# Patient Record
Sex: Female | Born: 1935
Health system: Southern US, Community
[De-identification: ages and names within clinical notes are randomized; demographics above are authoritative.]

## PROBLEM LIST (undated history)

## (undated) DIAGNOSIS — Z9981 Dependence on supplemental oxygen: Secondary | ICD-10-CM

## (undated) DIAGNOSIS — N289 Disorder of kidney and ureter, unspecified: Secondary | ICD-10-CM

## (undated) DIAGNOSIS — F419 Anxiety disorder, unspecified: Secondary | ICD-10-CM

## (undated) DIAGNOSIS — Z436 Encounter for attention to other artificial openings of urinary tract: Secondary | ICD-10-CM

## (undated) DIAGNOSIS — E785 Hyperlipidemia, unspecified: Secondary | ICD-10-CM

## (undated) DIAGNOSIS — K529 Noninfective gastroenteritis and colitis, unspecified: Secondary | ICD-10-CM

## (undated) DIAGNOSIS — I739 Peripheral vascular disease, unspecified: Secondary | ICD-10-CM

## (undated) DIAGNOSIS — R0602 Shortness of breath: Secondary | ICD-10-CM

## (undated) DIAGNOSIS — I219 Acute myocardial infarction, unspecified: Secondary | ICD-10-CM

## (undated) DIAGNOSIS — R233 Spontaneous ecchymoses: Secondary | ICD-10-CM

## (undated) DIAGNOSIS — R569 Unspecified convulsions: Secondary | ICD-10-CM

## (undated) DIAGNOSIS — R413 Other amnesia: Secondary | ICD-10-CM

## (undated) DIAGNOSIS — J449 Chronic obstructive pulmonary disease, unspecified: Secondary | ICD-10-CM

## (undated) DIAGNOSIS — K52839 Microscopic colitis, unspecified: Secondary | ICD-10-CM

## (undated) DIAGNOSIS — I639 Cerebral infarction, unspecified: Secondary | ICD-10-CM

## (undated) DIAGNOSIS — I609 Nontraumatic subarachnoid hemorrhage, unspecified: Secondary | ICD-10-CM

## (undated) DIAGNOSIS — F32A Depression, unspecified: Secondary | ICD-10-CM

## (undated) DIAGNOSIS — K219 Gastro-esophageal reflux disease without esophagitis: Secondary | ICD-10-CM

## (undated) DIAGNOSIS — F329 Major depressive disorder, single episode, unspecified: Secondary | ICD-10-CM

## (undated) DIAGNOSIS — F0390 Unspecified dementia without behavioral disturbance: Secondary | ICD-10-CM

## (undated) DIAGNOSIS — Z8619 Personal history of other infectious and parasitic diseases: Secondary | ICD-10-CM

## (undated) DIAGNOSIS — R238 Other skin changes: Secondary | ICD-10-CM

## (undated) DIAGNOSIS — M1990 Unspecified osteoarthritis, unspecified site: Secondary | ICD-10-CM

## (undated) DIAGNOSIS — C4490 Unspecified malignant neoplasm of skin, unspecified: Secondary | ICD-10-CM

## (undated) DIAGNOSIS — Z8709 Personal history of other diseases of the respiratory system: Secondary | ICD-10-CM

## (undated) DIAGNOSIS — N1330 Unspecified hydronephrosis: Secondary | ICD-10-CM

## (undated) DIAGNOSIS — K449 Diaphragmatic hernia without obstruction or gangrene: Secondary | ICD-10-CM

## (undated) HISTORY — PX: APPENDECTOMY: SHX54

## (undated) HISTORY — PX: TONSILLECTOMY: SUR1361

## (undated) HISTORY — PX: TUBAL LIGATION: SHX77

## (undated) HISTORY — PX: KNEE ARTHROSCOPY: SUR90

## (undated) HISTORY — DX: Unspecified convulsions: R56.9

## (undated) HISTORY — PX: SKIN CANCER EXCISION: SHX779

---

## 2000-11-09 ENCOUNTER — Ambulatory Visit (HOSPITAL_COMMUNITY): Admission: RE | Admit: 2000-11-09 | Discharge: 2000-11-09 | Payer: Self-pay | Admitting: Ophthalmology

## 2001-02-01 ENCOUNTER — Ambulatory Visit (HOSPITAL_COMMUNITY): Admission: RE | Admit: 2001-02-01 | Discharge: 2001-02-01 | Payer: Self-pay | Admitting: Internal Medicine

## 2001-02-01 ENCOUNTER — Encounter: Payer: Self-pay | Admitting: Internal Medicine

## 2001-05-12 ENCOUNTER — Ambulatory Visit (HOSPITAL_COMMUNITY): Admission: RE | Admit: 2001-05-12 | Discharge: 2001-05-12 | Payer: Self-pay | Admitting: Internal Medicine

## 2001-05-12 ENCOUNTER — Encounter: Payer: Self-pay | Admitting: Internal Medicine

## 2001-06-28 ENCOUNTER — Ambulatory Visit (HOSPITAL_COMMUNITY): Admission: RE | Admit: 2001-06-28 | Discharge: 2001-06-28 | Payer: Self-pay | Admitting: Internal Medicine

## 2002-03-02 ENCOUNTER — Encounter: Payer: Self-pay | Admitting: Internal Medicine

## 2002-03-02 ENCOUNTER — Ambulatory Visit (HOSPITAL_COMMUNITY): Admission: RE | Admit: 2002-03-02 | Discharge: 2002-03-02 | Payer: Self-pay | Admitting: Internal Medicine

## 2002-08-24 ENCOUNTER — Other Ambulatory Visit: Admission: RE | Admit: 2002-08-24 | Discharge: 2002-08-24 | Payer: Self-pay | Admitting: Unknown Physician Specialty

## 2003-03-15 ENCOUNTER — Ambulatory Visit (HOSPITAL_COMMUNITY): Admission: RE | Admit: 2003-03-15 | Discharge: 2003-03-15 | Payer: Self-pay | Admitting: Internal Medicine

## 2003-03-28 ENCOUNTER — Ambulatory Visit (HOSPITAL_COMMUNITY): Admission: RE | Admit: 2003-03-28 | Discharge: 2003-03-28 | Payer: Self-pay | Admitting: Internal Medicine

## 2003-10-09 ENCOUNTER — Ambulatory Visit (HOSPITAL_COMMUNITY): Admission: RE | Admit: 2003-10-09 | Discharge: 2003-10-09 | Payer: Self-pay | Admitting: Family Medicine

## 2004-03-31 ENCOUNTER — Ambulatory Visit (HOSPITAL_COMMUNITY): Admission: RE | Admit: 2004-03-31 | Discharge: 2004-03-31 | Payer: Self-pay | Admitting: Internal Medicine

## 2005-05-21 ENCOUNTER — Ambulatory Visit (HOSPITAL_COMMUNITY): Admission: RE | Admit: 2005-05-21 | Discharge: 2005-05-21 | Payer: Self-pay | Admitting: Family Medicine

## 2006-02-22 ENCOUNTER — Ambulatory Visit: Payer: Self-pay | Admitting: Internal Medicine

## 2006-03-01 ENCOUNTER — Encounter (INDEPENDENT_AMBULATORY_CARE_PROVIDER_SITE_OTHER): Payer: Self-pay | Admitting: *Deleted

## 2006-03-01 ENCOUNTER — Ambulatory Visit (HOSPITAL_COMMUNITY): Admission: RE | Admit: 2006-03-01 | Discharge: 2006-03-01 | Payer: Self-pay | Admitting: Internal Medicine

## 2006-03-01 ENCOUNTER — Ambulatory Visit: Payer: Self-pay | Admitting: Internal Medicine

## 2006-04-28 ENCOUNTER — Ambulatory Visit: Payer: Self-pay | Admitting: Internal Medicine

## 2006-12-03 ENCOUNTER — Ambulatory Visit (HOSPITAL_COMMUNITY): Admission: RE | Admit: 2006-12-03 | Discharge: 2006-12-03 | Payer: Self-pay | Admitting: Family Medicine

## 2007-07-27 ENCOUNTER — Ambulatory Visit: Payer: Self-pay | Admitting: Internal Medicine

## 2007-08-10 ENCOUNTER — Ambulatory Visit: Payer: Self-pay | Admitting: Internal Medicine

## 2007-08-10 ENCOUNTER — Encounter: Payer: Self-pay | Admitting: Internal Medicine

## 2007-08-10 ENCOUNTER — Ambulatory Visit (HOSPITAL_COMMUNITY): Admission: RE | Admit: 2007-08-10 | Discharge: 2007-08-10 | Payer: Self-pay | Admitting: Internal Medicine

## 2008-01-06 DIAGNOSIS — I609 Nontraumatic subarachnoid hemorrhage, unspecified: Secondary | ICD-10-CM

## 2008-01-06 HISTORY — DX: Nontraumatic subarachnoid hemorrhage, unspecified: I60.9

## 2008-01-23 ENCOUNTER — Emergency Department (HOSPITAL_COMMUNITY): Admission: EM | Admit: 2008-01-23 | Discharge: 2008-01-24 | Payer: Self-pay | Admitting: Emergency Medicine

## 2008-01-24 ENCOUNTER — Ambulatory Visit: Payer: Self-pay | Admitting: Cardiovascular Disease

## 2008-01-24 ENCOUNTER — Inpatient Hospital Stay (HOSPITAL_COMMUNITY): Admission: AD | Admit: 2008-01-24 | Discharge: 2008-01-26 | Payer: Self-pay | Admitting: Neurology

## 2008-01-24 ENCOUNTER — Encounter: Payer: Self-pay | Admitting: Emergency Medicine

## 2008-01-25 ENCOUNTER — Encounter (INDEPENDENT_AMBULATORY_CARE_PROVIDER_SITE_OTHER): Payer: Self-pay | Admitting: Neurology

## 2008-08-16 ENCOUNTER — Ambulatory Visit (HOSPITAL_COMMUNITY): Admission: RE | Admit: 2008-08-16 | Discharge: 2008-08-16 | Payer: Self-pay | Admitting: Family Medicine

## 2009-08-26 ENCOUNTER — Ambulatory Visit (HOSPITAL_COMMUNITY): Admission: RE | Admit: 2009-08-26 | Discharge: 2009-08-26 | Payer: Self-pay | Admitting: Family Medicine

## 2010-01-26 ENCOUNTER — Encounter: Payer: Self-pay | Admitting: Family Medicine

## 2010-01-27 ENCOUNTER — Encounter: Payer: Self-pay | Admitting: Family Medicine

## 2010-04-21 LAB — GLUCOSE, CAPILLARY
Glucose-Capillary: 110 mg/dL — ABNORMAL HIGH (ref 70–99)
Glucose-Capillary: 119 mg/dL — ABNORMAL HIGH (ref 70–99)
Glucose-Capillary: 87 mg/dL (ref 70–99)
Glucose-Capillary: 95 mg/dL (ref 70–99)

## 2010-04-21 LAB — DIFFERENTIAL
Basophils Absolute: 0 10*3/uL (ref 0.0–0.1)
Basophils Relative: 1 % (ref 0–1)
Eosinophils Absolute: 0.1 10*3/uL (ref 0.0–0.7)
Lymphs Abs: 1.3 10*3/uL (ref 0.7–4.0)
Monocytes Absolute: 0.4 10*3/uL (ref 0.1–1.0)
Monocytes Relative: 8 % (ref 3–12)
Neutro Abs: 3.6 10*3/uL (ref 1.7–7.7)
Neutrophils Relative %: 65 % (ref 43–77)

## 2010-04-21 LAB — HEMOGLOBIN A1C: Mean Plasma Glucose: 117 mg/dL

## 2010-04-21 LAB — LIPID PANEL
Cholesterol: 214 mg/dL — ABNORMAL HIGH (ref 0–200)
Total CHOL/HDL Ratio: 2.7 RATIO
VLDL: 20 mg/dL (ref 0–40)

## 2010-04-21 LAB — CBC
Hemoglobin: 13.4 g/dL (ref 12.0–15.0)
MCHC: 33.6 g/dL (ref 30.0–36.0)
MCV: 96.3 fL (ref 78.0–100.0)
RDW: 13.8 % (ref 11.5–15.5)

## 2010-04-21 LAB — COMPREHENSIVE METABOLIC PANEL
BUN: 8 mg/dL (ref 6–23)
Chloride: 103 mEq/L (ref 96–112)
Creatinine, Ser: 0.6 mg/dL (ref 0.4–1.2)
Glucose, Bld: 114 mg/dL — ABNORMAL HIGH (ref 70–99)
Potassium: 3.9 mEq/L (ref 3.5–5.1)
Sodium: 137 mEq/L (ref 135–145)

## 2010-04-21 LAB — URINE MICROSCOPIC-ADD ON

## 2010-04-21 LAB — HOMOCYSTEINE: Homocysteine: 7.8 umol/L (ref 4.0–15.4)

## 2010-04-21 LAB — URINALYSIS, ROUTINE W REFLEX MICROSCOPIC
Glucose, UA: NEGATIVE mg/dL
Hgb urine dipstick: NEGATIVE
Protein, ur: NEGATIVE mg/dL
Specific Gravity, Urine: 1.011 (ref 1.005–1.030)

## 2010-04-21 LAB — APTT: aPTT: 28 seconds (ref 24–37)

## 2010-05-20 NOTE — Discharge Summary (Signed)
Bridget Ball, Bridget Ball                ACCOUNT NO.:  [id number]   MEDICAL RECORD NO.:  [id number]          PATIENT TYPE:  INP   LOCATION:  3032                         FACILITY:  [facility]   PHYSICIAN:  Pramod P. Pearlean Brownie, MD    DATE OF BIRTH:  1929-01-02   DATE OF ADMISSION:  01/24/2008  DATE OF DISCHARGE:  01/26/2008                               DISCHARGE SUMMARY   DIAGNOSES AT TIME OF DISCHARGE:  1. Right parietal and left frontal small subarachnoid hemorrhages,      question venous angiomas.  2. Acute right frontal ischemic infarct.  3. Dyslipidemia.  4. Arthroscopic right knee surgery.  5. Chronic obstructive pulmonary disease.  6. Six previous normal deliveries.   MEDICINES AT TIME OF DISCHARGE:  1. Keppra 250 mg b.i.d.  2. Zantac 75 mg a day.  3. Atrovent inhaler p.r.n.  4. Simvastatin 20 mg a day.  5. Discontinue aspirin.   STUDIES PERFORMED:  1. CT of the brain on January 18, shows no acute abnormality, 2 foci      of __________ density, one at paramedian frontal lobe and second in      the high right frontal lobe concerning for subarachnoid hemorrhage.  2. MRI of the brain shows right precentral sulcus subarachnoid      hemorrhage, signal abnormality in the cortex and perhaps CSF of the      medial left frontal lobe, question infarction versus focal      cerebritis versus distal embolic subacute infarctions.  Punctated      area of diffusion abnormality right frontal operculum, likely      subacute/acute infarct,  evidence of periventricular subcortical      white matter changes compatible with chronic microvascular      ischemia.  3. A 2-D echocardiogram shows EF of 55% with no obvious source of      embolus.  4. Carotid Doppler shows no ICA stenosis bilaterally.  5. Transcranial Doppler completed, results pending.  6. EEG performed, results pending.   LABORATORY STUDIES:  Homocysteine 7.8.  Hemoglobin A1c 5.7.  Urinalysis  with 3-6 white blood cells, 0-2 red blood  cells, otherwise normal.  Cholesterol 214, triglycerides 101, HDL 80, and LDL 114.  Cardiac  enzymes with CK-MB 4.2 and troponin normal.  Chemistry with glucose 114,  otherwise normal.  Coagulation studies normal.  CBC normal.   HISTORY OF PRESENT ILLNESS:  [name] is a 77 year old right-  handed Caucasian female who was seen on January 23, 2008, at [facility]  Emergency Room after eating dinner and having a sudden sharp pain in her  left arm from the elbow to the shoulder.  She said her hand became claw  like and she was unable to use it.  This resolved over a few minutes.  It was followed by numbness and tingling in the left side of her face  perhaps her left arm, but she is not sure.  All symptoms had resolved by  the time she reached the emergency room.  A CT performed at [facility]  showed a possible small focus of  subarachnoid blood.  The patient was  discharged home but asked to return for an MRI on January 19.  MRI  results were available.  They confirmed subarachnoid hemorrhage.  The  patient still had no neurologic complaints.  Multiple attempts were made  to find an admitting physician for this patient and eventually  neurologist admitted her to [facility] for further evaluation.   HOSPITAL COURSE:  Interventional Neuroradiology performed a cerebral  angiogram in the hospital showing no occlusion, dissections, or  aneurysms.  She has venous outflow that was grossly within normal  limits.  No etiology for her subarachnoid hemorrhage was found.  It was  felt she may have had a venous angioma with her subarachnoid hemorrhage  __________ and history of seizure.  She was started on Keppra 250 mg  twice a day and was doing well.  An EEG was performed and that is  pending at the time of discharge.  She has no neurologic complaints or  therapy needs.   CONDITION ON DISCHARGE:  Alert and oriented x3.  No focal neurologic  deficit.  No weakness.   DISCHARGE PLAN:  1.  Discharged home with family.  2. No aspirin or aspirin-containing products.  3. New Keppra.  4. Follow up with Dr. Delia Heady in 2-3 months.  5. Follow up with primary care physician, Dr. Cecelia Byars within 1 month.      Annie Main, N.P.    ______________________________  Sunny Schlein. Pearlean Brownie, MD    SB/MEDQ  D:  01/26/2008  T:  01/27/2008  Job:  [id number]   cc:   Pramod P. Pearlean Brownie, MD

## 2010-05-20 NOTE — H&P (Signed)
NAME:  Bridget Ball, BANGERTER                ACCOUNT NO.:  [id number]   MEDICAL RECORD NO.:  [id number]          PATIENT TYPE:  AMB   LOCATION:  DAY                           FACILITY:  [facility]   PHYSICIAN:  R. Roetta Sessions, M.D. DATE OF BIRTH:  04-Oct-1928   DATE OF ADMISSION:  DATE OF DISCHARGE:  [facility]                              HISTORY & PHYSICAL   CHIEF COMPLAINT:  Vague upper abdominal discomfort, nausea, anorexia,  weakness, and black stools for 3 weeks.   HISTORY OF PRESENT ILLNESS:  [name] is a white 77 year old Caucasian  female followed primarily by Dr. Oval Linsey, who came to see me  after developing a sore throat in early June.  She was seen by Dr.  Janna Arch who gave her Z-Pak and some naproxen to take for throat pain.  She took several days' worth of naproxen.  Throat pain eventually  subsided, but she has developed lack of appetite and chronic nausea, but  she has not vomited.  Her stools became black for 3 weeks.  She has  developed progressive weakness and has no energy to do much of anything.  She has not had any blood work.  She has not had any hematochezia or  hematemesis.  No odynophagia or dysphagia.  She has a history  gastroesophageal reflux disease, takes various over-the-counter H2  blockers.  She had been somewhat constipated recently as well.   This lady has a history of Schatzki ring which was dilated by Dr. Loreta Ave.  Several years ago, she was found to have an ulcer at the GE junction at  that time and also she had diarrhea which was worked up with a  colonoscopy and biopsies back in February 2008 without any evidence of  inflammatory bowel disease or microscopic colitis.  Gallbladder remains  in situ.   PAST MEDICAL HISTORY:  1. Hypercholesterolemia.  2. Gastroesophageal reflux disease.  3. Question COPD.   SURGERIES:  1. Tubal ligation.  2. Tonsillectomy.  3. Right lumpectomy, breast.  4. Right knee surgery.   ALLERGIES:  PENICILLIN.   FAMILY  HISTORY:  Mother died at age 61 with a CVA.  Father was killed in  an accident.  Otherwise, no history of chronic GI or liver illness.   SOCIAL HISTORY:  The patient is widowed.  She is retired from [employer].  Quit smoking 15 years ago, 2 glasses of wine each night is all  the alcohol she consumes.   REVIEW OF SYSTEMS:  No recent chest pain, dyspnea on exertion, fever, or  chills.   PHYSICAL EXAMINATION:  GENERAL:  A thin 77 year old lady resting  comfortably.  VITAL SIGNS:  Weight 124, height 5 feet 3 inches, temperature 98, BP  110/74, and pulse 80.  SKIN:  Warm and dry.  There is no jaundice.  HEENT:  No scleral icterus.  Conjunctivae are somewhat pale.  CHEST:  Lungs are clear to auscultation.  CARDIOVASCULAR:  Regular rate and rhythm without murmur, gallop, or rub.  ABDOMEN:  Nondistended.  Positive bowel sounds, soft, and nontender  without appreciable mass or organomegaly.  EXTREMITIES:  No edema.  RECTAL:  No mass in the rectal vault.  Good sphincter tone.  Scant dark  brown stools, Hemoccult positive.   IMPRESSION:  [name] is a nice pleasant 77 year old lady with a recent  treatment for what sounds like pharyngitis with azithromycin and  subsequently taking naproxen for symptomatic relief as well, who  developed progressive anorexia and vague upper abdominal discomfort and  has had black stools for 3 weeks, not mentioned why she has not been  taking Pepto-Bismol.  I am concerned about the possibility of her having  an nonsteroidal antiinflammatory drug-induced peptic ulcer and a recent  gastrointestinal bleed.  She remains hemodynamically stable.  She is to  refrain from taking any further Naprosyn.   RECOMMENDATIONS:  Begin Aciphex 20 mg orally daily, samples given.  We  will check a CBC today, and we will proceed with an EGD.  Forthwith  risks, benefits, alternatives, and limitations have been reviewed,  questions were answered.  Make further recommendations  in the very near  future.      Jonathon Bellows, M.D.  Electronically Signed     RMR/MEDQ  D:  07/27/2007  T:  07/28/2007  Job:  [id number]   cc:   Melvyn Novas, MD  Fax: [phone]

## 2010-05-20 NOTE — Procedures (Signed)
CLINICAL HISTORY:  The patient is a 77 year old, admitted on January 24, 2008, for TIA, subdural hemorrhage, left-handed weakness and numbness,  history of chronic obstructive pulmonary disease.  Study is being done  to look for the presence of seizures (780.39)   PROCEDURE:  The tracing was carried out on a 32-channel digital Cadwell  recorder, reformatted into 16-channel montages with one devoted to EKG.  The patient was awake during the recording and drowsy.  The  International 10/20 system lead placement used.   DESCRIPTION OF FINDINGS:  Dominant frequency was 9 Hz, 25-30 microvolt  activity with frontally predominant beta range activity with drowsiness,  mixed frequency theta range activity with superimposed.  There was no  focal slowing.  There was no interictal epileptiform activity in the  form of spikes or sharp waves.   Activating procedures with photic stimulation failed to induce driving  response.  Hyperventilation was not carried out.  EKG showed regular  sinus rhythm with ventricular response of 84 beats per minute.   IMPRESSION:  Normal record with the patient awake and drowsy.      Deanna Artis. Sharene Skeans, M.D.  Electronically Signed     ZOX:WRUE  D:  01/26/2008 22:47:42  T:  01/27/2008 45:40:98  Job #:  [id number]   cc:   Melvyn Novas, M.D.  Fax: [phone]

## 2010-05-20 NOTE — Op Note (Signed)
NAME:  Bridget Ball, Bridget Ball                ACCOUNT NO.:  [id number]   MEDICAL RECORD NO.:  [id number]          PATIENT TYPE:  AMB   LOCATION:  DAY                           FACILITY:  [facility]   PHYSICIAN:  R. Roetta Sessions, M.D. DATE OF BIRTH:  [birth year]   DATE OF PROCEDURE:  08/10/2007  DATE OF DISCHARGE:                               OPERATIVE REPORT   EGD with biopsy.   INDICATIONS FOR PROCEDURE:  A 77 year old lady who had recent upper  abdominal pain in the setting of azithromycin and Naprosyn use.  She has  had 3-week history of black tarry stools.  She stopped taking Naprosyn,  stools normalized in color, and the upper abdominal pain has subsided.  She has been on Aciphex 20 mg orally daily.  She was hemoccult positive  in the office.  However, CBC from July 27, 2007, was completely normal,  H&H 13.3 and 40.70, and MCV 96.4.  Last colonoscopy was in February  2008.  She was found to have no evidence of mucosal abnormality.  EGD is  now being done.  This approach was discussed with the patient at length.  Risks, benefits, and alternatives have been reviewed and questions were  answered.  Of note, she has not had any reflux symptoms and no  esophageal dysphagia recently.   PROCEDURE NOTE:  O2 saturation, blood pressure, pulse, and respirations  monitored throughout the entire procedure.   CONSCIOUS SEDATION:  Versed 3 mg IV and Demerol 50 mg IV in divided  doses.   INSTRUMENT:  Pentax video chip system.  Cetacaine spray for topical  pharyngeal anesthesia.   FINDINGS:  Examination of tubular esophagus revealed no mucosal  abnormalities.  EG junction easily traversed.  Stomach:  Gastric cavity  was emptied and insufflated very well with air.  Thorough examination of  the gastric mucosa including retroflexed view of the proximal stomach  esophagogastric junction demonstrated a small hiatal hernia and some  superficial antral erosions.  The gastric body and fundus had a knobby  or  nodular appearance, did not really see an infiltrating process.  There was no ulcer, but the mucosa did appear to be abnormal.  Pylorus  was patent and easily traversed.  Examination of the bulb, second  portion revealed no abnormalities.  Therapeutic/diagnostic maneuvers  performed.  Multiple biopsies of gastric mucosa are taken for histologic  study.  The patient tolerated the procedure well and was reacted in  Endoscopy.   IMPRESSION:  Normal esophagus, small hiatal hernia, knobby nodular-  appearing gastric mucosa body and fundus of stress significance status  post biopsy antral erosions.  No ulcer or infiltrating process seen.  Patent pylorus.  Normal D1 and D2.   RECOMMENDATIONS:  1. Continue Aciphex 20 mg orally daily, prescription given.  2. Followup on path.  3. Further recommendations to follow.      Jonathon Bellows, M.D.  Electronically Signed     RMR/MEDQ  D:  08/10/2007  T:  08/11/2007  Job:  [id number]   cc:   Delbert Harness, MD

## 2010-05-20 NOTE — H&P (Signed)
Bridget Ball, Bridget Ball NO.:  [id number]   MEDICAL RECORD NO.:  [id number]          PATIENT TYPE:  INP   LOCATION:  3032                         FACILITY:  [facility]   PHYSICIAN:  Melvyn Novas, M.D.  DATE OF BIRTH:  [birth year]   DATE OF ADMISSION:  01/24/2008  DATE OF DISCHARGE:                              HISTORY & PHYSICAL   The patient is a 77 year old Caucasian right-handed female, former  smoker, who provided the following history.  She was seen on January 23, 2008, in the local ER [facility] after eating dinner and  feeling suddenly a sharp pain in her left arm from the elbow to the  shoulder.  She also said her hand became claw like and she was unable to  use it.  This spell resolved over a few minutes.  It was followed by  numbness and tingling in the left side of her face, perhaps also the  left arm, but she is not sure about that.  She states that she was  afraid this may mean a stroke.  She looked into the marrow to see if she  could detect a left-sided facial droop, but she had none and she had  also no trouble with speech.  All symptoms resolved by the time she came  to the ER.  A CT was then obtained at [facility] showing a possible small  focus of subarachnoid blood.  The patient was discharged home, but asked  to return for an MRI today on January 24, 2008.  After the MRI results were available, she was asked by the ER physician  to stay in the ER until a stroke bed was found.  It turns out that the patient who had no physical complaints since  yesterday had also an abnormal MRI stating subarachnoid blood as  confirmed in the right high frontal lobe within the precentral sulcus.  There is increased signal within the CSF and cortex.  Gradient images  did demonstrate a signal loss.  White matter changes were already present and seemed to be of remote  origin, punctate area of diffusion abnormality in the right frontal lobe  pattern was noted by Dr. Alfredo Batty.  The question of a subarachnoid hemorrhage was answered by the  radiologist as being positive.  The results were telephoned to Dr.  Tressie Stalker, neurosurgeon on-call, who stated that the patient  should be deferred to Dr. Corliss Skains.  The ER physician in [facility], Dr. Colon Branch called Dr. Corliss Skains who  stated that he would like to see the patient but she has to be admitted  to somebody else's service.  He will see her in consultation and for any  interventions.  Instead of deferring the patient to a hospitalist  service, it was mentioned to the ER physician that she may try the  Neurology Service.  At this time, the ER physician is I think  understandably frustrated and I accept that the patient thereby in  transfer for further workup which will probably include a catheter  cerebral angiogram in the morning.   The  patient has a past medical history of six normal pregnancies with  vaginal deliveries, arthroscopic knee surgery of right knee.   She has no history of diabetes, hypertension, or obstructive sleep  apnea.  She states she has hypercholesterolemia and also she believes  she has asthma.  Her doctors have told her multiple times she has COPD.  Her primary care physician is Dr. Leeann Must.  Stroke risk factors as  named above.  Coagulopathy, previous strokes, or obesity are not  present.   MEDICATIONS:  1. Simvastatin 20 mg daily.  2. Aspirin 81 mg daily.  3. She is on multivitamins.  4. Atrovent p.r.n.   The patient is allergic to PENICILLIN.   FAMILY HISTORY:  The patient said her mother, who at the age of 36 died  following a stroke.  She may have had multiple strokes in her last year  of life she states.   SOCIAL HISTORY:  The patient is widowed since age 62 in 73.  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  She was smoking until 1999.  Her  remote history of  tobacco use was not matched by any history of illicit  drug use or alcohol use.   REVIEW OF SYSTEMS:  The patient endorsed a diffuse headache but has no  neck rigors or stiffness.  She has temporal artery tenderness and she  has occipital tenderness, but no palpable induration of the temporal  artery is noted and no paraspinal trigger points were elicited.  She had  no problems in terms of pulmonary, musculoskeletal, hematologic,  lymphatic, GI, endocrine, allergic, reproductive, or psychiatric  problems.   PHYSICAL EXAMINATION:  VITAL SIGNS:  Temperature of 98 degrees  Fahrenheit, blood pressure is 125/78, heart rate is 69, respiratory rate  is 18 at rest, and O2 saturation is 92% at rest.  The height of the  patient is 5 feet 2 inches and weight is 122 pounds.  EARS, NOSE, AND THROAT:  Normal.  LUNGS:  Clear to auscultation.  No wheezing.  No rhonchi.  ABDOMEN:  Soft, nontender, and nondistended.  NEUROLOGIC:  The patient is not obese, of normal stature.  She is alert  and oriented x3, has a fluent speech.  Full range of motion for her neck  and shows no facial asymmetry or sensory loss.  She has on cranial nerve  examination full extraocular movements.  No nystagmus.  No jaw tremor.  Motor examination shows equal strength but increased tones throughout  the left upper and lower extremities.  Deep tendon reflexes are slightly  brisker on the left than on the right and an upgoing toe response to  Babinski maneuver was noted on the left only.  The patient has no loss  of sensory, however and her coordination test by finger-to-nose showed  slowing bilaterally but dysmetria was only seen on the left.  Heel-to-  shin was normal.   The patient's nutritional status is normal.  Her NIH stroke scale is 0.  Her modified Rankin score is 0.  The patient is presenting after CT and  MRI have been obtained at [facility], both have been read as  showing a subarachnoid hemorrhage.  Because  of the suspected  subarachnoid hemorrhage, there is a possibility that the patient may  have a focal seizure and that she can develop vascular spasms.  We will  tomorrow perform a catheter angiogram under the  guidance of Dr. Julieanne Cotton, who was already called.  I will ask Dr. Tressie Stalker to  consult on the patient.  I will continue her medication which includes  Tylenol PM, Zocor, simvastatin and Atrovent inhalers.  She will also be  on Reglan, Tylenol p.r.n., and aspirin daily.  She will be on an IV  fluid of normal saline in spite of her regular diet.  The patient was of  course not a t-PA patient given her history of possible bleed.   LABORATORY TESTS:  Show a normal comprehensive metabolic panel  nonfasting which showed elevated glucose.  PTT was 28, PT was 12.6, and  INR 0.9 and CBC with differential showed a white blood cell count of  5.6, hemoglobin/hematocrit 13.4/39.7, and platelet count 235,000.  No  lymphoeosinophilia or granulocytemia.  Fasting lipid profile,  homocysteine, cardiac enzymes, and HbA1c will follow tomorrow.     Melvyn Novas, M.D.  Electronically Signed    CD/MEDQ  D:  01/24/2008  T:  01/25/2008  Job:  [id number]   cc:   Pramod P. Pearlean Brownie, MD

## 2010-05-23 NOTE — Consult Note (Signed)
Bridget Ball, Bridget Ball                ACCOUNT NO.:  [id number]   MEDICAL RECORD NO.:  [id number]          PATIENT TYPE:  AMB   LOCATION:  DAY                           FACILITY:  [facility]   PHYSICIAN:  Lionel December, M.D.    DATE OF BIRTH:  [birth year]   DATE OF CONSULTATION:  DATE OF DISCHARGE:                                 CONSULTATION   REFERRING PHYSICIAN:  Dr. Janna Arch.   CHIEF COMPLAINT:  Chronic diarrhea.   HISTORY OF PRESENT ILLNESS:  Bridget Ball is a 77 year old Caucasian  female who tells me approximately three months' ago she developed  diarrheal illness.  She was having multiple loose stools per day.  She  then began taking fiber laxatives and she felt that she was constipated.  Since that time, she discontinued the fiber laxatives.  She began to  have large volume diarrhea.  She tells me for approximately 6 weeks now  she has had within 10-20 loose watery stools per day.  She was seen by  Dr. Janna Arch and started on Flagyl and has completed about a weeks'  worth of treatment.  She is also using Lomotil p.r.n.  She denies any  rectal bleeding or mucus in her stools.  She does complain of  incontinence as well as bilateral lower quadrant abdominal pain, left  side greater than right.  She denies any fever, chills, nausea or  vomiting.  She denies any recent antibiotic use prior to the onset.  Denies any foreign travel or new medications or pets.  She has been  exposed to her grandchildren who did have viral gastroenteritis prior to  the onset of this.   PAST MEDICAL HISTORY:  1. Hiatal hernia.  2. COPD.  3. Hyperplastic polyp on flexible sigmoidoscopy back in 2000 by Dr.      Karilyn Cota.  She has never had a colonoscopy.  4. Tubal ligation.  5. Appendectomy.  6. Tonsillectomy.  7. Right knee surgery.   CURRENT MEDICATIONS:  1. Quinine p.r.n.  2. Zocor 20 mg daily.  3. Aspirin 81 mg daily.  4. Lomotil 2.5 mg p.r.n.  5. Flagyl 500 mg b.i.d.  6. Naproxen 1 b.i.d.  She  recently discontinued when she began to have      diarrhea.  7. Atrovent p.r.n.  8. Estro Vital nutrients.  9. Biotin 500 mcg daily.  10.Over-the-counter allergy medicine.  11.[employer] daily.  12.Gingko-biloba 60 mg daily.  13.Alfalfa 650 mg daily.  14.Ocuvite once daily.  15.Over-the-counter acid reducer daily.  16.Benifiber p.r.n.  17.Lecithin 400 mg daily.   ALLERGIES:  PENICILLIN.   FAMILY HISTORY:  There is no known family history of questionable liver  or chronic GI problems.  Mother died at age 32 secondary to  complications of diabetes mellitus.  Father died due to accident.  One  brother with history of diabetes mellitus.   SOCIAL HISTORY:  Bridget Ball lives alone.  She has 6 healthy children.  She is a widow.  She is retired.  She drinks 2 glasses of wine daily.  Denies any tobacco or drug use.   REVIEW  OF SYSTEMS:  CONSTITUTIONAL:  Weight is stable.  Denies any fever  or chills.  Denies any fatigue.  CARDIOVASCULAR:  Denies any chest pain  or palpitation.  RESPIRATORY:  No shortness of breath, dyspnea, cough or  hemoptysis.  GI:  See HPI.  Denies any heartburn, angina, dysphagia,  odynophagia, anorexia or early satiety.  GU:  Denies any dysuria,  hematuria or increased urinary frequency.   PHYSICAL EXAMINATION:  VITAL SIGNS:  Weight 132.5 pounds.  Height 63  inches to 93.  Blood pressure 110/74, pulse 74.  GENERAL:  Bridget Ball is an 77 year old Caucasian female who is alert,  oriented, pleasant, cooperative in no acute distress.  She appears  younger than her stated age.  HEENT:  Sclerae are clear.  Nonicteric.  Conjunctivae pink.  Oropharynx  pink and moist without lesions.  Neck supple without thyromegaly.  CHEST:  Regular rate and rhythm.  Normal S1-S2 with murmurs, clicks,  rubs or gallops.  LUNGS:  Clear to auscultation bilaterally.  ABDOMEN:  Positive bowel sounds x4.  No bruits auscultated.  She has  mild tenderness to left lower quadrant on deep  palpation.  No rebound  tenderness or guarding.  No hepatosplenomegaly or mass.  RECTAL:  Deferred.  EXTREMITIES:  Without clubbing or edema bilaterally.  SKIN:  Pink, warm and dry without any rashes or jaundice.   IMPRESSION:  Ms. Amstutz is a 77 year old female with a 73-month history  of diarrhea, more recently the last 6 weeks have been quite severe.  She  is having upwards of 10 to 20 loose, watery stools a day.  She has been  given Flagyl through her primary care physician and has completed a  weeks' worth.  Etiology of her diarrhea include bacterial infection,  colitis, lymphocytic colitis or late onset inflammatory bowel disease.  Other possibility include lymphoma.   PLAN:  1. Colonoscopy with Dr. Karilyn Cota in the near future.  I discussed the      procedure including risks and benefits including all risks of      bleeding, infection, perforation and she agrees to the things that      will be obtained.  2. She can continue Lomotil p.r.n.  She is instructed to go ahead and      complete the course of Flagyl.  3. Further recommendations pending procedure.   We would like to thank Dr. Janna Arch for allowing [place] to participate in  the care of Bridget Ball.      Nicholas Lose, N.P.      Lionel December, M.D.  Electronically Signed    KC/MEDQ  D:  02/22/2006  T:  02/22/2006  Job:  [id number]   cc:   Melvyn Novas, MD  Fax: [phone]

## 2010-05-23 NOTE — Consult Note (Signed)
[facility]  Patient:    Bridget Ball, Bridget Ball Visit Number: [id number] MRN: [id number]          Service Type: OUT Location: RAD Attending Physician:  Della Goo Dictated by:   Gardiner Coins, P.A.-C. Proc. Date: 06/21/01 Admit Date:  05/12/2001 Discharge Date: 05/12/2001   CC:         Ron Parker, M.D.   Consultation Report  DATE OF BIRTH:  [birth year]  REFERRING PHYSICIAN:  Ron Parker, M.D.  CHIEF COMPLAINT:  Trouble swallowing, abdominal bloating and discomfort.  HISTORY OF PRESENT ILLNESS:  The patient is a very nice 77 year old Caucasian female we have been asked to see by Dr. Lovell Sheehan for further evaluation of above complaints.  She has just over a months history of abdominal bloating and pressure-type pain and discomfort.  Symptoms are in the epigastric area. No associated nausea or vomiting.  She has also noted some pill dysphagia and early satiety.  She had an upper GI series with barium esophagogram and small-bowel follow-through May 12, 2001 that revealed normal primary esophageal peristalsis with mildly impaired secondary peristalsis, small sliding hiatal hernia, smooth short segment narrowing of the distal esophagus at the GE junction with the remainder of the esophagus, stomach, duodenal bulb and small bowel appearing normal.  Terminal ileum was normal.  A 12.5 mm barium tablet passed normally through the esophagus to the level of the narrowing of the GE junction and initially lodged at that location but passed after repeat swallows of water.  She does have heartburn one to two times per month primarily with food triggers such as barbecue.  She has a history of GERD for which symptoms were well controlled with Prilosec 20 mg daily.  She has discontinued this because of cough and is taking Zantac on a p.r.n. basis now. Has known history of cholelithiasis that is felt to be asymptomatic.  Also has a history of  alternating constipation and diarrhea and has been felt in the past to have IBS.  Had flexible sigmoidoscopy done by Dr. Karilyn Cota September 24, 1998 that revealed multiple tiny polyps at the rectosigmoid junction that were hyperplastic on biopsy.  At this point having bowel movement every day very rarely will go every other day.  No hematochezia or melena.  Stools are darker than usual when she eats spinach.  CURRENT MEDICATIONS:  Atrovent inhaler b.i.d. and Zantac p.r.n.  ALLERGIES:  PENICILLIN causes generalized skin rash and swelling.  PAST MEDICAL HISTORY:  History of hiatal hernia and questionable COPD. History of IBS.  History of multiple urethral dilatations.  History of recurrent allergic rhinitis and sinus problems.  PAST SURGICAL HISTORY:  Tonsillectomy, appendectomy, right benign breast biopsy, tubal ligation, and right knee surgery.  FAMILY HISTORY:  Mother is alive at age 77 and has a history of diabetes mellitus and hiatal hernia.  Father died age 39 an accidental death.  One brother alive and no sisters.  There is no family history of colorectal cancer, chronic liver disease, or chronic GI illness.  SOCIAL HISTORY:  The patient is widowed.  She has 5 children.  One daughter died at infancy and had some congenital defects at birth.  She is retired now but used to [employer] work.  She smoked a pack a day for about 40 years but quit 4-5 years ago.  Continues to drink wine socially.  REVIEW OF SYSTEMS:  As in HPI.  In addition, no chest pain or history of MI. Reports she  does have a history of heart murmur that they think may be related to possible rheumatic fever when she was a child.  Murmur was diagnosed after the birth of her fifth child.  No fevers or chills.  PHYSICAL EXAMINATION:  Weight 133-1/4 pounds (flat, no masses or organomegaly).  Mildly uncomfortable palpation of the left lower quadrant. No guarding, no rebound.  Rectal exam deferred as she had one done  at Dr. Teofilo Pod office May 10, 2001 that revealed no masses and heme-negative stool.  Extremities: No lower extremity edema.  IMPRESSION:  The patient is a very nice 77 year old with pill dysphagia as well as symptoms of bloating with associated upper abdominal bloating discomfort as well as some early satiety and rare typical reflux symptoms approximately one to two times per month.  Upper GI symptoms previously responded to PPI therapy but she is not on this because of cost considerations at this point and is only having rare typical reflux symptoms.  Does have known history of cholelithiasis.  Not mentioned above, negative Murphys sign on physical exam today.  Discussed EGD with ED with Ms. Terrilee Croak.  Her questions were answered and she is agreeable to proceeding with procedure.  RECOMMENDATIONS: 1. EGD with ED at [facility] in the near future.  Consider antibiotic    prophylaxis day of the procedure because of history of heart murmur    although none was appreciated on physical exam today. 2. Consider beginning PPI therapy after EGD. 3. Begin Citrucel one packet mixed in noncarbonated beverage of choice daily.    Given 21 sample packets of clear mix. 4. Hemoccult cards x3 as part of routine colorectal cancer screening.  We would like to thank Dr. Ron Parker for allowing [place] to participate in the care of this very nice woman. Dictated by:   Gardiner Coins, P.A.-C. Attending Physician:  Della Goo DD:  06/21/01 TD:  06/22/01 Job: [id number]

## 2010-05-23 NOTE — Op Note (Signed)
Bridget Ball, Bridget Ball                ACCOUNT NO.:  [id number]   MEDICAL RECORD NO.:  [id number]          PATIENT TYPE:  AMB   LOCATION:  DAY                           FACILITY:  [facility]   PHYSICIAN:  Lionel December, M.D.    DATE OF BIRTH:  10-18-1928   DATE OF PROCEDURE:  03/01/2006  DATE OF DISCHARGE:                               OPERATIVE REPORT   PROCEDURE:  Colonoscopy.   INDICATIONS:  Bridget Ball is a 77 year old Caucasian female with chronic  nonbloody diarrhea who is undergoing diagnostic colonoscopy.  She has  never had a screening colonoscopy either.  She had a sigmoidoscopy for  screening purpose about 8 years ago, but has never had a colonoscopy.  Procedure and risks were reviewed with the patient; and informed consent  was obtained.   MEDS FOR CONSCIOUS SEDATION:  Demerol 25 mg IV, Versed 8 mg IV.   FINDINGS:  Procedure performed in endoscopy suite.  The patient's vital  signs and O2 saturations were monitored during the procedure and  remained stable.  The patient was placed in the left lateral position  and rectal examination performed.  No abnormality noted on external or  digital exam.  Pentax videoscope was placed in the rectum and advanced  under vision into the sigmoid colon which was very tortuous and  redundant.  Using abdominal pressure and by repeatedly withdrawing the  scope, I was able to keep moving forward. The patient also had to be  turned on her back and once on the right side.  Slowly and carefully the  scope was advanced cecum which was identified by ileocecal valve which  had a nonbleeding AVM on it.  The blind cecum was normal.  A picture was  taken of the appendiceal orifice for the record.  As the scope was  withdrawn, colonic mucosa was carefully examined; and was normal  throughout.  Random biopsies were taken from distal sigmoid colon  looking for microscopic and/or collagenous colitis.  Rectal mucosa was  normal.  Scope was retroflexed to examine the  anorectal junction; and  small hemorrhoids were noted below the dentate line.  Endoscope was  straightened and withdrawn.  The patient tolerated the procedure well.   FINAL DIAGNOSIS:  Redundant, but normal colonoscopy except small cecum.   FINAL DIAGNOSIS:  1. No evidence of colitis.  2. 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  3. External hemorrhoids.  4. Random biopsies taken from sigmoid colon looking for microscopic      and/or collagenous colitis.  5. If biopsies are normal, would consider therapy for irritable bowel      syndrome; and if she does not respond, will need further evaluation      with attention to small bowel.      Lionel December, M.D.  Electronically Signed     NR/MEDQ  D:  03/01/2006  T:  03/01/2006  Job:  [id number]   cc:   Melvyn Novas, MD  Fax: [phone]

## 2010-09-23 ENCOUNTER — Other Ambulatory Visit (HOSPITAL_COMMUNITY): Payer: Self-pay | Admitting: Family Medicine

## 2010-09-23 DIAGNOSIS — Z139 Encounter for screening, unspecified: Secondary | ICD-10-CM

## 2010-10-02 ENCOUNTER — Ambulatory Visit (HOSPITAL_COMMUNITY)
Admission: RE | Admit: 2010-10-02 | Discharge: 2010-10-02 | Disposition: A | Discharge status: Home / Self Care | Payer: Medicare Other | Source: Ambulatory Visit | Attending: Family Medicine | Admitting: Family Medicine

## 2010-10-02 DIAGNOSIS — Z1231 Encounter for screening mammogram for malignant neoplasm of breast: Secondary | ICD-10-CM | POA: Insufficient documentation

## 2010-10-02 DIAGNOSIS — Z139 Encounter for screening, unspecified: Secondary | ICD-10-CM

## 2010-10-06 ENCOUNTER — Ambulatory Visit (HOSPITAL_COMMUNITY): Payer: Medicare Other

## 2011-03-19 ENCOUNTER — Telehealth (INDEPENDENT_AMBULATORY_CARE_PROVIDER_SITE_OTHER): Payer: Self-pay

## 2011-03-19 NOTE — Telephone Encounter (Signed)
Called Dr. Patty Sermons office to schedule appointment for [name] per Dr. Abbey Chatters to Evaluate Cirrhosis, liver mass, patient newly dx w/colon cancer.  Called [facility] to schedule MRI of Liver Mass.

## 2011-04-07 ENCOUNTER — Encounter (INDEPENDENT_AMBULATORY_CARE_PROVIDER_SITE_OTHER): Payer: Self-pay | Admitting: *Deleted

## 2011-04-07 NOTE — Progress Notes (Signed)
This encounter was created in error - please disregard.

## 2011-11-13 ENCOUNTER — Other Ambulatory Visit (HOSPITAL_COMMUNITY): Payer: Self-pay | Admitting: Family Medicine

## 2011-11-13 DIAGNOSIS — Z139 Encounter for screening, unspecified: Secondary | ICD-10-CM

## 2011-11-19 ENCOUNTER — Ambulatory Visit (HOSPITAL_COMMUNITY)
Admission: RE | Admit: 2011-11-19 | Discharge: 2011-11-19 | Disposition: A | Discharge status: Home / Self Care | Payer: Medicare Other | Source: Ambulatory Visit | Attending: Family Medicine | Admitting: Family Medicine

## 2011-11-19 DIAGNOSIS — Z1231 Encounter for screening mammogram for malignant neoplasm of breast: Secondary | ICD-10-CM | POA: Insufficient documentation

## 2011-11-19 DIAGNOSIS — Z139 Encounter for screening, unspecified: Secondary | ICD-10-CM

## 2012-11-12 ENCOUNTER — Encounter (HOSPITAL_COMMUNITY): Payer: Self-pay | Admitting: Emergency Medicine

## 2012-11-12 ENCOUNTER — Inpatient Hospital Stay (HOSPITAL_COMMUNITY)
Admission: EM | Admit: 2012-11-12 | Discharge: 2012-11-16 | DRG: 603 | Disposition: A | Discharge status: Home / Self Care | Payer: Medicare Other | Attending: Family Medicine | Admitting: Family Medicine

## 2012-11-12 DIAGNOSIS — Z8673 Personal history of transient ischemic attack (TIA), and cerebral infarction without residual deficits: Secondary | ICD-10-CM

## 2012-11-12 DIAGNOSIS — S81009A Unspecified open wound, unspecified knee, initial encounter: Secondary | ICD-10-CM | POA: Diagnosis present

## 2012-11-12 DIAGNOSIS — L259 Unspecified contact dermatitis, unspecified cause: Secondary | ICD-10-CM | POA: Diagnosis present

## 2012-11-12 DIAGNOSIS — Z87891 Personal history of nicotine dependence: Secondary | ICD-10-CM

## 2012-11-12 DIAGNOSIS — M129 Arthropathy, unspecified: Secondary | ICD-10-CM | POA: Diagnosis present

## 2012-11-12 DIAGNOSIS — I872 Venous insufficiency (chronic) (peripheral): Secondary | ICD-10-CM | POA: Diagnosis present

## 2012-11-12 DIAGNOSIS — W64XXXA Exposure to other animate mechanical forces, initial encounter: Secondary | ICD-10-CM | POA: Diagnosis present

## 2012-11-12 DIAGNOSIS — Z833 Family history of diabetes mellitus: Secondary | ICD-10-CM

## 2012-11-12 DIAGNOSIS — L02419 Cutaneous abscess of limb, unspecified: Principal | ICD-10-CM | POA: Diagnosis present

## 2012-11-12 DIAGNOSIS — S81809A Unspecified open wound, unspecified lower leg, initial encounter: Secondary | ICD-10-CM

## 2012-11-12 DIAGNOSIS — J449 Chronic obstructive pulmonary disease, unspecified: Secondary | ICD-10-CM | POA: Diagnosis present

## 2012-11-12 DIAGNOSIS — L03116 Cellulitis of left lower limb: Secondary | ICD-10-CM

## 2012-11-12 DIAGNOSIS — L03115 Cellulitis of right lower limb: Secondary | ICD-10-CM | POA: Diagnosis present

## 2012-11-12 DIAGNOSIS — Z85828 Personal history of other malignant neoplasm of skin: Secondary | ICD-10-CM

## 2012-11-12 DIAGNOSIS — B370 Candidal stomatitis: Secondary | ICD-10-CM | POA: Diagnosis present

## 2012-11-12 DIAGNOSIS — J4489 Other specified chronic obstructive pulmonary disease: Secondary | ICD-10-CM | POA: Diagnosis present

## 2012-11-12 HISTORY — DX: Unspecified osteoarthritis, unspecified site: M19.90

## 2012-11-12 HISTORY — DX: Cerebral infarction, unspecified: I63.9

## 2012-11-12 HISTORY — DX: Chronic obstructive pulmonary disease, unspecified: J44.9

## 2012-11-12 LAB — CBC WITH DIFFERENTIAL/PLATELET
Basophils Absolute: 0 10*3/uL (ref 0.0–0.1)
Eosinophils Relative: 2 % (ref 0–5)
HCT: 34 % — ABNORMAL LOW (ref 36.0–46.0)
Hemoglobin: 11.2 g/dL — ABNORMAL LOW (ref 12.0–15.0)
Lymphocytes Relative: 11 % — ABNORMAL LOW (ref 12–46)
Lymphs Abs: 0.9 10*3/uL (ref 0.7–4.0)
MCV: 95.2 fL (ref 78.0–100.0)
Monocytes Absolute: 0.7 10*3/uL (ref 0.1–1.0)
Neutro Abs: 6.3 10*3/uL (ref 1.7–7.7)
RBC: 3.57 MIL/uL — ABNORMAL LOW (ref 3.87–5.11)
WBC: 8 10*3/uL (ref 4.0–10.5)

## 2012-11-12 MED ORDER — SODIUM CHLORIDE 0.9 % IV SOLN
Freq: Once | INTRAVENOUS | Status: AC
  Administered 2012-11-12: via INTRAVENOUS

## 2012-11-12 MED ORDER — MORPHINE SULFATE 4 MG/ML IJ SOLN
4.0000 mg | Freq: Once | INTRAMUSCULAR | Status: AC
  Administered 2012-11-12: 4 mg via INTRAVENOUS
  Filled 2012-11-12: qty 1

## 2012-11-12 MED ORDER — VANCOMYCIN HCL IN DEXTROSE 1-5 GM/200ML-% IV SOLN
1000.0000 mg | Freq: Once | INTRAVENOUS | Status: AC
  Administered 2012-11-12: 1000 mg via INTRAVENOUS
  Filled 2012-11-12: qty 200

## 2012-11-12 NOTE — ED Notes (Signed)
Pt reporting pain in lower legs due to cellulitis.  Pt currently being treated with antibiotics and pain medication.  Pt reporting that she did take all her medications today with no relief from pain.

## 2012-11-12 NOTE — ED Notes (Signed)
Infection of both lower legs for months, is taking vicodin for pain - has taken three today without relief.  Dr Olevia Bowens has been treating with Keflex and Vicodin.  Is to see Dr. Lovell Sheehan this coming Tuesday, but pain is much more severe tonight.  Has light dressings over open wounds on lower legs.

## 2012-11-13 ENCOUNTER — Encounter (HOSPITAL_COMMUNITY): Payer: Self-pay | Admitting: Internal Medicine

## 2012-11-13 DIAGNOSIS — B370 Candidal stomatitis: Secondary | ICD-10-CM | POA: Diagnosis present

## 2012-11-13 DIAGNOSIS — S81009A Unspecified open wound, unspecified knee, initial encounter: Secondary | ICD-10-CM

## 2012-11-13 DIAGNOSIS — S81809A Unspecified open wound, unspecified lower leg, initial encounter: Secondary | ICD-10-CM

## 2012-11-13 DIAGNOSIS — L03115 Cellulitis of right lower limb: Secondary | ICD-10-CM | POA: Diagnosis present

## 2012-11-13 DIAGNOSIS — L02419 Cutaneous abscess of limb, unspecified: Principal | ICD-10-CM

## 2012-11-13 LAB — BASIC METABOLIC PANEL
CO2: 27 mEq/L (ref 19–32)
Calcium: 9.6 mg/dL (ref 8.4–10.5)
Chloride: 100 mEq/L (ref 96–112)
Creatinine, Ser: 0.64 mg/dL (ref 0.50–1.10)
Glucose, Bld: 112 mg/dL — ABNORMAL HIGH (ref 70–99)
Sodium: 136 mEq/L (ref 135–145)

## 2012-11-13 LAB — LACTIC ACID, PLASMA: Lactic Acid, Venous: 1.2 mmol/L (ref 0.5–2.2)

## 2012-11-13 MED ORDER — LEVOFLOXACIN IN D5W 500 MG/100ML IV SOLN
INTRAVENOUS | Status: AC
  Filled 2012-11-13: qty 100

## 2012-11-13 MED ORDER — ONDANSETRON HCL 4 MG/2ML IJ SOLN
4.0000 mg | Freq: Four times a day (QID) | INTRAMUSCULAR | Status: DC | PRN
  Administered 2012-11-16: 4 mg via INTRAVENOUS
  Filled 2012-11-13: qty 2

## 2012-11-13 MED ORDER — ACETAMINOPHEN 325 MG PO TABS: 650.0000 mg | ORAL_TABLET | Freq: Four times a day (QID) | ORAL | Status: DC | PRN

## 2012-11-13 MED ORDER — OXYCODONE HCL 5 MG PO TABS
5.0000 mg | ORAL_TABLET | ORAL | Status: DC | PRN
  Administered 2012-11-13 – 2012-11-16 (×9): 5 mg via ORAL
  Filled 2012-11-13 (×9): qty 1

## 2012-11-13 MED ORDER — DOCUSATE SODIUM 100 MG PO CAPS
100.0000 mg | ORAL_CAPSULE | Freq: Two times a day (BID) | ORAL | Status: DC
  Administered 2012-11-13 – 2012-11-16 (×7): 100 mg via ORAL
  Filled 2012-11-13 (×7): qty 1

## 2012-11-13 MED ORDER — ONDANSETRON HCL 4 MG PO TABS: 4.0000 mg | ORAL_TABLET | Freq: Four times a day (QID) | ORAL | Status: DC | PRN

## 2012-11-13 MED ORDER — VANCOMYCIN HCL IN DEXTROSE 1-5 GM/200ML-% IV SOLN
1000.0000 mg | INTRAVENOUS | Status: DC
  Administered 2012-11-13 – 2012-11-14 (×2): 1000 mg via INTRAVENOUS
  Filled 2012-11-13 (×4): qty 200

## 2012-11-13 MED ORDER — HYDROMORPHONE HCL PF 1 MG/ML IJ SOLN
1.0000 mg | Freq: Once | INTRAMUSCULAR | Status: AC
  Administered 2012-11-13: 1 mg via INTRAVENOUS

## 2012-11-13 MED ORDER — LEVOFLOXACIN 500 MG PO TABS
500.0000 mg | ORAL_TABLET | Freq: Every day | ORAL | Status: DC
  Administered 2012-11-14 – 2012-11-16 (×3): 500 mg via ORAL
  Filled 2012-11-13 (×3): qty 1

## 2012-11-13 MED ORDER — HYDROMORPHONE HCL PF 1 MG/ML IJ SOLN
1.0000 mg | INTRAMUSCULAR | Status: DC | PRN
  Administered 2012-11-13 – 2012-11-15 (×7): 1 mg via INTRAVENOUS
  Filled 2012-11-13 (×8): qty 1

## 2012-11-13 MED ORDER — ALBUTEROL SULFATE (5 MG/ML) 0.5% IN NEBU: 2.5000 mg | INHALATION_SOLUTION | RESPIRATORY_TRACT | Status: DC | PRN

## 2012-11-13 MED ORDER — NYSTATIN 100000 UNIT/ML MT SUSP
5.0000 mL | Freq: Four times a day (QID) | OROMUCOSAL | Status: DC
  Administered 2012-11-13 – 2012-11-16 (×12): 500000 [IU] via ORAL
  Filled 2012-11-13 (×13): qty 5

## 2012-11-13 MED ORDER — SODIUM CHLORIDE 0.9 % IV SOLN
INTRAVENOUS | Status: AC
  Administered 2012-11-13: 75 mL via INTRAVENOUS
  Administered 2012-11-13: 10:00:00 via INTRAVENOUS

## 2012-11-13 MED ORDER — HYDROMORPHONE HCL PF 1 MG/ML IJ SOLN
INTRAMUSCULAR | Status: AC
  Filled 2012-11-13: qty 1

## 2012-11-13 MED ORDER — ACETAMINOPHEN 650 MG RE SUPP: 650.0000 mg | Freq: Four times a day (QID) | RECTAL | Status: DC | PRN

## 2012-11-13 MED ORDER — LEVOFLOXACIN IN D5W 500 MG/100ML IV SOLN
500.0000 mg | Freq: Once | INTRAVENOUS | Status: AC
  Administered 2012-11-13: 500 mg via INTRAVENOUS
  Filled 2012-11-13: qty 100

## 2012-11-13 MED ORDER — ENOXAPARIN SODIUM 40 MG/0.4ML ~~LOC~~ SOLN
40.0000 mg | SUBCUTANEOUS | Status: DC
  Administered 2012-11-13 – 2012-11-16 (×4): 40 mg via SUBCUTANEOUS
  Filled 2012-11-13 (×4): qty 0.4

## 2012-11-13 MED ORDER — COLLAGENASE 250 UNIT/GM EX OINT
TOPICAL_OINTMENT | Freq: Every day | CUTANEOUS | Status: DC
  Administered 2012-11-13: 13:00:00 via TOPICAL
  Administered 2012-11-14: 1 via TOPICAL
  Administered 2012-11-15: 10:00:00 via TOPICAL
  Administered 2012-11-16: 1 via TOPICAL
  Filled 2012-11-13: qty 30

## 2012-11-13 MED ORDER — LORAZEPAM 0.5 MG PO TABS
0.5000 mg | ORAL_TABLET | Freq: Three times a day (TID) | ORAL | Status: DC | PRN
  Administered 2012-11-15 – 2012-11-16 (×3): 0.5 mg via ORAL
  Filled 2012-11-13 (×3): qty 1

## 2012-11-13 MED ORDER — VITAMIN B-1 100 MG PO TABS
100.0000 mg | ORAL_TABLET | Freq: Every day | ORAL | Status: DC
  Administered 2012-11-13 – 2012-11-16 (×4): 100 mg via ORAL
  Filled 2012-11-13 (×4): qty 1

## 2012-11-13 NOTE — Progress Notes (Signed)
ANTIBIOTIC CONSULT NOTE-Preliminary  Pharmacy Consult for Vancomycin and Levaquin Indication: Cellulitis  Allergies  Allergen Reactions  . Penicillins Anaphylaxis   Patient Measurements: Height: 5\' 2"  (157.5 cm) Weight: 122 lb 8 oz (55.566 kg) IBW/kg (Calculated) : 50.1  Vital Signs: Temp: 98 F (36.7 C) (11/09 0416) Temp src: Oral (11/09 0416) BP: 121/73 mmHg (11/09 0416) Pulse Rate: 97 (11/09 0416)  Labs:  Recent Labs  11/12/12 2332  WBC 8.0  HGB 11.2*  PLT 328  CREATININE 0.64    Estimated Creatinine Clearance: 42.9 ml/min (by C-G formula based on Cr of 0.64).  No results found for this basename: VANCOTROUGH, VANCOPEAK, VANCORANDOM, GENTTROUGH, GENTPEAK, GENTRANDOM, TOBRATROUGH, TOBRAPEAK, TOBRARND, AMIKACINPEAK, AMIKACINTROU, AMIKACIN,  in the last 72 hours   Microbiology: No results found for this or any previous visit (from the past 720 hour(s)).  Medical History: Past Medical History  Diagnosis Date  . COPD (chronic obstructive pulmonary disease)   . Arthritis   . Stroke     TIA four years ago   Medications:  Vancomycin 1 Gm IV in the ED Levaquin 500mg  IV given x 1  Assessment: 79 yo female with chronic nonhealing wounds and cellulitis bilaterally to her lower extremities.  Estimated Creatinine Clearance: 42.9 ml/min (by C-G formula based on Cr of 0.64).  Goal of Therapy:  Vancomycin troughs 10-15 mcg/ml Eradication of infection  Plan:  Vancomycin 1000mg  IV q24hrs Check trough at steady state Levaquin 500mg  PO daily Monitor labs, renal fxn, and cultures  Wayland Denis, RPH 11/13/2012,11:02 AM

## 2012-11-13 NOTE — ED Provider Notes (Signed)
CSN: [id number]     Arrival date & time 11/12/12  2048 History   First MD Initiated Contact with Patient 11/12/12 2259     Chief Complaint  Patient presents with  . Wound Infection    both lower legs   (Consider location/radiation/quality/duration/timing/severity/associated sxs/prior Treatment) HPI Comments: 79 year old female with a history of lower extremity swelling and redness which has been present for 6 months. She states that she had injuries to her legs from a dog and from a rooster which injured her skin. Since that time she has had multiple antibiotic prescriptions of Keflex and has been taking Vicodin for the pain. Over the last month the swelling has increased, there is no foul-smelling discharge from the wounds and her legs feel red, swollen and are becoming more tender. This is gradually worsening, is now severe, she denies fevers chills nausea vomiting abdominal pain chest pain or shortness of breath.  The history is provided by the patient and a relative.    Past Medical History  Diagnosis Date  . COPD (chronic obstructive pulmonary disease)   . Arthritis   . Stroke     TIA four years ago   Past Surgical History  Procedure Laterality Date  . Appendectomy    . Skin cancer excision    . Tubal ligation     No family history on file. History  Substance Use Topics  . Smoking status: Former Games developer  . Smokeless tobacco: Not on file  . Alcohol Use: Yes     Comment: 2 glasses a night   OB History   Grav Para Term Preterm Abortions TAB SAB Ect Mult Living                 Review of Systems  All other systems reviewed and are negative.    Allergies  Penicillins  Home Medications   Current Outpatient Rx  Name  Route  Sig  Dispense  Refill  . cephALEXin (KEFLEX) 500 MG capsule   Oral   Take 500 mg by mouth 3 (three) times daily. Started on 10/26/12         . Hydrocodone-Acetaminophen (VICODIN) 5-300 MG TABS   Oral   Take 1 tablet by mouth 3 (three) times  daily as needed. pain          BP 106/66  Pulse 97  Temp(Src) 97.8 F (36.6 C) (Oral)  Resp 18  Ht 5\' 2"  (1.575 m)  Wt 120 lb (54.432 kg)  BMI 21.94 kg/m2  SpO2 92% Physical Exam  Nursing note and vitals reviewed. Constitutional: She appears well-developed and well-nourished. No distress.  HENT:  Head: Normocephalic and atraumatic.  Mouth/Throat: Oropharynx is clear and moist. No oropharyngeal exudate.  Eyes: Conjunctivae and EOM are normal. Pupils are equal, round, and reactive to light. Right eye exhibits no discharge. Left eye exhibits no discharge. No scleral icterus.  Neck: Normal range of motion. Neck supple. No JVD present. No thyromegaly present.  Cardiovascular: Normal rate, regular rhythm, normal heart sounds and intact distal pulses.  Exam reveals no gallop and no friction rub.   No murmur heard. Pulmonary/Chest: Effort normal and breath sounds normal. No respiratory distress. She has no wheezes. She has no rales.  Abdominal: Soft. Bowel sounds are normal. She exhibits no distension and no mass. There is no tenderness.  Musculoskeletal: Normal range of motion. She exhibits edema and tenderness.  Significant erythema and wounds to the lower extremities bilaterally. These are in a stage of healing however the  wounds do have a foul-smelling purulence. The legs have pitting edema bilaterally, red warm and tender to the knees.  Lymphadenopathy:    She has no cervical adenopathy.  Neurological: She is alert. Coordination normal.  Skin: Skin is warm and dry. Rash noted. There is erythema.  Psychiatric: She has a normal mood and affect. Her behavior is normal.    ED Course  Procedures (including critical care time) Labs Review Labs Reviewed  CBC WITH DIFFERENTIAL - Abnormal; Notable for the following:    RBC 3.57 (*)    Hemoglobin 11.2 (*)    HCT 34.0 (*)    Neutrophils Relative % 78 (*)    Lymphocytes Relative 11 (*)    All other components within normal limits   BASIC METABOLIC PANEL - Abnormal; Notable for the following:    Glucose, Bld 112 (*)    GFR calc non Af Amer 81 (*)    All other components within normal limits  WOUND CULTURE  LACTIC ACID, PLASMA   Imaging Review No results found.  EKG Interpretation   None       MDM   1. Cellulitis of leg, left   2. Cellulitis of leg, right    The patient has what appears to be a worsening cellulitis, significant edema and is very symptomatic from these wounds and cellulitis. Antibiotics were ordered with vancomycin IV, wound cultures, labs do not suggest sepsis as she does not have a leukocytosis nor any lactic acidosis. We'll discuss with admitting physician  Discussed with Dr. Mellody Drown, agrees with admission to MedSurg, IV antibiotics given.  Meds given in ED:  Medications  0.9 %  sodium chloride infusion ( Intravenous New Bag/Given 11/12/12 2357)  vancomycin (VANCOCIN) IVPB 1000 mg/200 mL premix (1,000 mg Intravenous New Bag/Given 11/12/12 2358)  morphine 4 MG/ML injection 4 mg (4 mg Intravenous Given 11/12/12 2358)        Vida Roller, MD 11/13/12 0121

## 2012-11-13 NOTE — ED Notes (Signed)
Patient and daughter asking for more pain medicine, stating that its not taking edge off. Spoke with  Designer, jewellery. Also gave patient Biotene to moisturize her mouth.

## 2012-11-13 NOTE — H&P (Signed)
[facility] History and Physical  Bridget Ball ZOX:[id number] DOB: 10/01/1928 DOA: 11/12/2012   PCP: Isabella Stalling, MD  Specialists: She is supposed to see Dr. Lovell Sheehan, general surgery in the near future  Chief Complaint: Pain in both her legs  HPI: Bridget Ball is a 79 y.o. female with a past medical history of TIA, COPD, who was in her usual state of health till June, when she had one of her legs, scratched by a dog and the other scratched by a rooster. She developed wounds on both her legs as a result. She has been cleaning and dressing these wounds since June and has had multiple courses of antibiotics. However, the wounds have not healed. The pain has been getting progressively worse. She was prescribed Vicodin 2 weeks ago by her PCP, but it's not alleviating the pain. The legs have been warm to touch. The left leg is worse than the right. The pain is 10 out of 10 in intensity and is worse with walking. She has also noticed worsening leg swelling and burning sensation in these legs. Denies any fever. She has also noted yellowish green discharge from the wounds, left more than the right. Some bleeding has also been seen from these wounds but not in large amounts. Since her symptoms were getting worse she decided to come to the hospital.  Home Medications: Prior to Admission medications   Medication Sig Start Date End Date Taking? Authorizing Provider  cephALEXin (KEFLEX) 500 MG capsule Take 500 mg by mouth 3 (three) times daily. Started on 10/26/12   Yes Historical Provider, MD  Hydrocodone-Acetaminophen (VICODIN) 5-300 MG TABS Take 1 tablet by mouth 3 (three) times daily as needed. pain   Yes Historical Provider, MD    Allergies:  Allergies  Allergen Reactions  . Penicillins Anaphylaxis    Past Medical History: Past Medical History  Diagnosis Date  . COPD (chronic obstructive pulmonary disease)   . Arthritis   . Stroke     TIA four years ago    Past Surgical  History  Procedure Laterality Date  . Appendectomy    . Skin cancer excision    . Tubal ligation      Social History: She lives in [place] with her daughter. She quit smoking 15 years ago. Has approximately 50 pack year history of smoking. Denies any illicit drug use. She drinks 2 glasses of wine every night. Uses a cane or a walker to ambulate    Family History:  Family History  Problem Relation Age of Onset  . Diabetes Mother      Review of Systems - History obtained from the patient General ROS: positive for  - fatigue Psychological ROS: negative Ophthalmic ROS: negative ENT ROS: negative Allergy and Immunology ROS: negative Hematological and Lymphatic ROS: negative Endocrine ROS: negative Respiratory ROS: no cough, shortness of breath, or wheezing Cardiovascular ROS: no chest pain or dyspnea on exertion Gastrointestinal ROS: no abdominal pain, change in bowel habits, or black or bloody stools Genito-Urinary ROS: no dysuria, trouble voiding, or hematuria Musculoskeletal ROS: as in hpi Neurological ROS: no TIA or stroke symptoms Dermatological ROS: as in hpi  Physical Examination  Filed Vitals:   11/12/12 2126 11/13/12 0033  BP: 109/61 106/66  Pulse: 87 97  Temp: 97.8 F (36.6 C)   TempSrc: Oral   Resp: 18 18  Height: 5\' 2"  (1.575 m)   Weight: 54.432 kg (120 lb)   SpO2: 94% 92%    General appearance: alert, cooperative,  appears stated age and no distress Head: Normocephalic, without obvious abnormality, atraumatic Eyes: conjunctivae/corneas clear. PERRL, EOM's intact Throat: White plaques are noted in the back of the throat consistent with thrush. Neck: no adenopathy, no carotid bruit, no JVD, supple, symmetrical, trachea midline and thyroid not enlarged, symmetric, no tenderness/mass/nodules Resp: clear to auscultation bilaterally Cardio: regular rate and rhythm, S1, S2 normal, no murmur, click, rub or gallop GI: soft, non-tender; bowel sounds normal; no  masses,  no organomegaly Extremities: There is mild erythema noted in bilateral lower extremities with warmth to touch. Tender to palpation near the wounds. Peripheral pulses are present. Pulses: 2+ and symmetric Skin: Erythema noted in Bilateral lower extremities Lymph nodes: Cervical, supraclavicular, and axillary nodes normal. Neurologic: Grossly normal Wound: Open wound measuring about 5 cm x 3 cm noted in the right leg. Tender to palpation. Yellowish green exudate is noted. No bleeding is present. Open wound also present in the left leg. However, the dressing was stuck to the wound. But no active drainage was noted.   Laboratory Data: Results for orders placed during the hospital encounter of 11/12/12 (from the past 48 hour(s))  CBC WITH DIFFERENTIAL     Status: Abnormal   Collection Time    11/12/12 11:32 PM      Result Value Range   WBC 8.0  4.0 - 10.5 K/uL   RBC 3.57 (*) 3.87 - 5.11 MIL/uL   Hemoglobin 11.2 (*) 12.0 - 15.0 g/dL   HCT 16.1 (*) 09.6 - 04.5 %   MCV 95.2  78.0 - 100.0 fL   MCH 31.4  26.0 - 34.0 pg   MCHC 32.9  30.0 - 36.0 g/dL   RDW 40.9  81.1 - 91.4 %   Platelets 328  150 - 400 K/uL   Neutrophils Relative % 78 (*) 43 - 77 %   Neutro Abs 6.3  1.7 - 7.7 K/uL   Lymphocytes Relative 11 (*) 12 - 46 %   Lymphs Abs 0.9  0.7 - 4.0 K/uL   Monocytes Relative 9  3 - 12 %   Monocytes Absolute 0.7  0.1 - 1.0 K/uL   Eosinophils Relative 2  0 - 5 %   Eosinophils Absolute 0.1  0.0 - 0.7 K/uL   Basophils Relative 0  0 - 1 %   Basophils Absolute 0.0  0.0 - 0.1 K/uL  BASIC METABOLIC PANEL     Status: Abnormal   Collection Time    11/12/12 11:32 PM      Result Value Range   Sodium 136  135 - 145 mEq/L   Potassium 3.6  3.5 - 5.1 mEq/L   Chloride 100  96 - 112 mEq/L   CO2 27  19 - 32 mEq/L   Glucose, Bld 112 (*) 70 - 99 mg/dL   BUN 11  6 - 23 mg/dL   Creatinine, Ser 7.82  0.50 - 1.10 mg/dL   Calcium 9.6  8.4 - 95.6 mg/dL   GFR calc non Af Amer 81 (*) >90 mL/min   GFR  calc Af Amer >90  >90 mL/min   Comment: (NOTE)     The eGFR has been calculated using the CKD EPI equation.     This calculation has not been validated in all clinical situations.     eGFR's persistently <90 mL/min signify possible Chronic Kidney     Disease.  LACTIC ACID, PLASMA     Status: None   Collection Time    11/12/12 11:33 PM  Result Value Range   Lactic Acid, Venous 1.2  0.5 - 2.2 mmol/L    Radiology Reports: No results found.   Problem List  Principal Problem:   Bilateral lower leg cellulitis Active Problems:   Open wound of leg   Oral thrush   Assessment: This is 79 year old, Caucasian female, who has chronic nonhealing wounds on both her lower extremities and appears to have cellulitis. There doesn't seem to be an abscess present at the site at this time.  Plan: #1 bilateral lower extremity cellulitis: She'll be treated with vancomycin and Levaquin. Pain control will be provided. Legs need to be kept elevated.  #2 chronic nonhealing wounds of the lower extremities: Consult Dr. Lovell Sheehan. Wet-to-dry dressings for now. She told me that she had arterial Dopplers done by her primary care physician and no vascular disease was noted. She could have venous insufficiency resulting in delayed healing.  #3 oral thrush: Probably due to multiple courses of antibiotics. Oral nystatin will be provided.  #4 alcohol use: She has 2 drinks every night. Give her thiamine. Ativan as needed.  DVT Prophylaxis: Lovenox Code Status: Full code Family Communication: Discussed with the patient and her daughter  Disposition Plan: Admit to MedSurg   Further management decisions will depend on results of further testing and patient's response to treatment.  [facility]  [facility] Pager [phone]  If 7PM-7AM, please contact night-coverage [email or web address] Password [email or web address]  11/13/2012, 2:41 AM

## 2012-11-13 NOTE — Progress Notes (Signed)
[id number] 

## 2012-11-13 NOTE — Progress Notes (Signed)
INITIAL NUTRITION ASSESSMENT  DOCUMENTATION CODES Per approved criteria  -Not Applicable   INTERVENTION:  Ensure Complete po BID, each supplement provides 350 kcal and 13 grams of protein.  RD will follow for nutrition care  NUTRITION DIAGNOSIS: Increased protein-energy needs related to wound healing as evidenced by estimated nutrition requirement guidelines.   Goal: Pt to meet >/= 90% of their estimated nutrition needs   Monitor:  Skin assessments, po intake, labs and wt trends  Reason for Assessment: Chronic wounds  79 y.o. female  Admitting Dx: Bilateral lower leg cellulitis  ASSESSMENT: Pt has chronic wounds to bilateral lower extremities.  Patient Active Problem List   Diagnosis Date Noted  . Bilateral lower leg cellulitis 11/13/2012  . Open wound of leg 11/13/2012  . Oral thrush 11/13/2012    Height: Ht Readings from Last 1 Encounters:  11/13/12 5\' 2"  (1.575 m)    Weight: Wt Readings from Last 1 Encounters:  11/13/12 122 lb 8 oz (55.566 kg)    Ideal Body Weight: 110# (50kg)  % Ideal Body Weight: 111%  Wt Readings from Last 10 Encounters:  11/13/12 122 lb 8 oz (55.566 kg)    Usual Body Weight: 120-125#  % Usual Body Weight: 100%  BMI:  Body mass index is 22.4 kg/(m^2).normal range  Estimated Nutritional Needs: Kcal: 1500-1800 Protein:  70-80 Fluid: 1700 ml daily  Skin: venous stasis disease, chronic, open superficial wound  Diet Order: General  EDUCATION NEEDS: -Education not appropriate at this time   Intake/Output Summary (Last 24 hours) at 11/13/12 2328 Last data filed at 11/13/12 0850  Gross per 24 hour  Intake 346.25 ml  Output      0 ml  Net 346.25 ml    Last BM: 11/12/12  Labs:   Recent Labs Lab 11/12/12 2332  NA 136  K 3.6  CL 100  CO2 27  BUN 11  CREATININE 0.64  CALCIUM 9.6  GLUCOSE 112*    CBG (last 3)  No results found for this basename: GLUCAP,  in the last 72 hours  Scheduled Meds: . collagenase    Topical Daily  . docusate sodium  100 mg Oral BID  . enoxaparin (LOVENOX) injection  40 mg Subcutaneous Q24H  . [START ON 11/14/2012] levofloxacin  500 mg Oral Daily  . nystatin  5 mL Oral QID  . thiamine  100 mg Oral Daily  . vancomycin  1,000 mg Intravenous Q24H    Continuous Infusions:   Past Medical History  Diagnosis Date  . COPD (chronic obstructive pulmonary disease)   . Arthritis   . Stroke     TIA four years ago    Past Surgical History  Procedure Laterality Date  . Appendectomy    . Skin cancer excision    . Tubal ligation      Royann Shivers MS,RD,CSG,LDN Office: [phone] Pager: [phone]

## 2012-11-13 NOTE — Consult Note (Signed)
Reason for Consult: Bilateral lower extremity chronic wounds Referring Physician: Dr. Delbert Ball  Bridget Ball is an 79 y.o. female.  HPI: Patient is an 79 year old white female who was supposed to see me in my office this week for treatment of chronic wounds of the lower extremities. Due to worsening pain and swelling, she presented to the emergency room for further evaluation treatment. She states that she has had these wounds for many months, having sustained scratch injuries from both a dog and a rooster. She did have arterial vascular studies done by her primary care physician which were noted to be within normal limits. She does have a history of varicosities of both lower extremities. She is not a diabetic.  Past Medical History  Diagnosis Date  . COPD (chronic obstructive pulmonary disease)   . Arthritis   . Stroke     TIA four years ago    Past Surgical History  Procedure Laterality Date  . Appendectomy    . Skin cancer excision    . Tubal ligation      Family History  Problem Relation Age of Onset  . Diabetes Mother     Social History:  reports that she quit smoking about 15 years ago. She does not have any smokeless tobacco history on file. She reports that she drinks alcohol. She reports that she does not use illicit drugs.  Allergies:  Allergies  Allergen Reactions  . Penicillins Anaphylaxis    Medications: I have reviewed the patient's current medications.  Results for orders placed during the hospital encounter of 11/12/12 (from the past 48 hour(s))  CBC WITH DIFFERENTIAL     Status: Abnormal   Collection Time    11/12/12 11:32 PM      Result Value Range   WBC 8.0  4.0 - 10.5 K/uL   RBC 3.57 (*) 3.87 - 5.11 MIL/uL   Hemoglobin 11.2 (*) 12.0 - 15.0 g/dL   HCT 11.9 (*) 14.7 - 82.9 %   MCV 95.2  78.0 - 100.0 fL   MCH 31.4  26.0 - 34.0 pg   MCHC 32.9  30.0 - 36.0 g/dL   RDW 56.2  13.0 - 86.5 %   Platelets 328  150 - 400 K/uL   Neutrophils Relative % 78  (*) 43 - 77 %   Neutro Abs 6.3  1.7 - 7.7 K/uL   Lymphocytes Relative 11 (*) 12 - 46 %   Lymphs Abs 0.9  0.7 - 4.0 K/uL   Monocytes Relative 9  3 - 12 %   Monocytes Absolute 0.7  0.1 - 1.0 K/uL   Eosinophils Relative 2  0 - 5 %   Eosinophils Absolute 0.1  0.0 - 0.7 K/uL   Basophils Relative 0  0 - 1 %   Basophils Absolute 0.0  0.0 - 0.1 K/uL  BASIC METABOLIC PANEL     Status: Abnormal   Collection Time    11/12/12 11:32 PM      Result Value Range   Sodium 136  135 - 145 mEq/L   Potassium 3.6  3.5 - 5.1 mEq/L   Chloride 100  96 - 112 mEq/L   CO2 27  19 - 32 mEq/L   Glucose, Bld 112 (*) 70 - 99 mg/dL   BUN 11  6 - 23 mg/dL   Creatinine, Ser 7.84  0.50 - 1.10 mg/dL   Calcium 9.6  8.4 - 69.6 mg/dL   GFR calc non Af Amer 81 (*) >90 mL/min  GFR calc Af Amer >90  >90 mL/min   Comment: (NOTE)     The eGFR has been calculated using the CKD EPI equation.     This calculation has not been validated in all clinical situations.     eGFR's persistently <90 mL/min signify possible Chronic Kidney     Disease.  LACTIC ACID, PLASMA     Status: None   Collection Time    11/12/12 11:33 PM      Result Value Range   Lactic Acid, Venous 1.2  0.5 - 2.2 mmol/L    No results found.  ROS: See chart Blood pressure 121/73, pulse 97, temperature 98 F (36.7 C), temperature source Oral, resp. rate 20, height 5\' 2"  (1.575 m), weight 55.566 kg (122 lb 8 oz), SpO2 96.00%. Physical Exam: Pleasant white female in no acute distress. Extremity examination reveals bilateral pedal pulses present. Skin skin with evidence of previous scarring are noted in both lower extremities. No calf swelling is noted. She has multiple superficial skin ulcerations present in both lower extremities above the ankles. Thick dark eschars are present. No exposed tendon is noted. Venous varicosities are noted most prominently in left lower extremity. Minimal erythema is noted around the wounds.  Assessment/Plan: Impression: Venous  stasis disease, dermatitis with chronic open superficial wound present. Plan: No need for acute surgical intervention. Will start chemical debridement of these eschars with collagenase. Will subsequently need ongoing treatment by physical therapy wound care. They have been consulted and will see patient tomorrow. Agree with antibiotic therapy for now, they'll I do not feel she will need long-term IV antibiotic therapy. I have explained the treatment and management of stasis dermatitis to the patient and daughter. They realize that this will take some time to heal.  Bridget Ball A 11/13/2012, 12:18 PM

## 2012-11-13 NOTE — ED Notes (Signed)
Daughter at bedside. Walked with patient to restroom to void. Patient asking again for pain medicine.

## 2012-11-13 NOTE — Progress Notes (Signed)
ANTIBIOTIC CONSULT NOTE-Preliminary  Pharmacy Consult for Vancomycin and Levaquin Indication: Cellulitis  Allergies  Allergen Reactions  . Penicillins Anaphylaxis    Patient Measurements: Height: 5\' 2"  (157.5 cm) Weight: 122 lb 8 oz (55.566 kg) IBW/kg (Calculated) : 50.1  Vital Signs: Temp: 98 F (36.7 C) (11/09 0416) Temp src: Oral (11/09 0416) BP: 121/73 mmHg (11/09 0416) Pulse Rate: 97 (11/09 0416)  Labs:  Recent Labs  11/12/12 2332  WBC 8.0  HGB 11.2*  PLT 328  CREATININE 0.64    Estimated Creatinine Clearance: 42.9 ml/min (by C-G formula based on Cr of 0.64).  No results found for this basename: VANCOTROUGH, VANCOPEAK, VANCORANDOM, GENTTROUGH, GENTPEAK, GENTRANDOM, TOBRATROUGH, TOBRAPEAK, TOBRARND, AMIKACINPEAK, AMIKACINTROU, AMIKACIN,  in the last 72 hours   Microbiology: No results found for this or any previous visit (from the past 720 hour(s)).  Medical History: Past Medical History  Diagnosis Date  . COPD (chronic obstructive pulmonary disease)   . Arthritis   . Stroke     TIA four years ago    Medications:  Vancomycin 1 Gm IV in the ED   Assessment: 79 yo female with chronic nonhealing wounds and cellulitis bilaterally to her lower extremities.   Goal of Therapy:  Vancomycin troughs 10-15 mcg/ml Eradication of infection  Plan:  Preliminary review of pertinent patient information completed.  Protocol will be initiated with a one-time dose of levofloxacin 500 mg IV. Vancomycin 1 Gm IV received in the ED. Jeani Hawking clinical pharmacist will complete review during morning rounds to assess patient and finalize treatment regimen.  Arelia Sneddon, [facility] 11/13/2012,5:22 AM

## 2012-11-14 LAB — CBC
HCT: 34.1 % — ABNORMAL LOW (ref 36.0–46.0)
Hemoglobin: 11.2 g/dL — ABNORMAL LOW (ref 12.0–15.0)
MCH: 31.6 pg (ref 26.0–34.0)
MCHC: 32.8 g/dL (ref 30.0–36.0)
MCV: 96.3 fL (ref 78.0–100.0)
RDW: 13.9 % (ref 11.5–15.5)

## 2012-11-14 LAB — COMPREHENSIVE METABOLIC PANEL
ALT: 11 U/L (ref 0–35)
Alkaline Phosphatase: 57 U/L (ref 39–117)
CO2: 29 mEq/L (ref 19–32)
Chloride: 106 mEq/L (ref 96–112)
GFR calc Af Amer: >90 mL/min (ref >90)
GFR calc non Af Amer: 87 mL/min — ABNORMAL LOW (ref >90)
Glucose, Bld: 93 mg/dL (ref 70–99)
Potassium: 3.3 mEq/L — ABNORMAL LOW (ref 3.5–5.1)
Sodium: 141 mEq/L (ref 135–145)
Total Bilirubin: 0.2 mg/dL — ABNORMAL LOW (ref 0.3–1.2)

## 2012-11-14 MED ORDER — ENSURE COMPLETE PO LIQD
237.0000 mL | Freq: Two times a day (BID) | ORAL | Status: DC
  Administered 2012-11-14 – 2012-11-16 (×4): 237 mL via ORAL

## 2012-11-14 NOTE — Progress Notes (Signed)
Awaiting evaluation by physical therapy. Will need outpatient wound care through the physical therapy department.

## 2012-11-14 NOTE — Progress Notes (Signed)
NAMEAZARYA, OCONNELL NO.:  [id number]  MEDICAL RECORD NO.:  [id number]  LOCATION:  A333                          FACILITY:  [facility]  PHYSICIAN:  Melvyn Novas, MDDATE OF BIRTH:  04-13-1928  DATE OF PROCEDURE: DATE OF DISCHARGE:                                PROGRESS NOTE   The patient has a chronic lower leg cellulitis due to rooster scratches several months ago with multiple outpatient oral and antibiotics given. She has venous stasis ulcers, venous disease, arterial Dopplers done as an outpatient revealed adequate blood flow bilateral in both lower extremities, placed on vancomycin IV.  Temperature 98, blood pressure 128/73, O2 sat 96%.  WBC is 8, hemoglobin 11.2, creatinine 0.64.  Lungs are clear to A and P.  No rales, wheeze, or rhonchi.  Heart:  Regular rhythm.  No murmurs, gallops, or rubs.  The legs were excoriated bilateral lower extremities and hyperemic.  Plan, right now is to continue vancomycin, continue as per surgical consultation recommendations, will look to continue therapy as an outpatient with home health care if possible in several days.     Melvyn Novas, MD     RMD/MEDQ  D:  11/13/2012  T:  11/14/2012  Job:  [id number]

## 2012-11-14 NOTE — Progress Notes (Signed)
[id number] 

## 2012-11-14 NOTE — Progress Notes (Signed)
ANTIBIOTIC CONSULT NOTE - FOLLOW UP  Pharmacy Consult for Vancomycin and Levaquin Indication: cellulitis  Allergies  Allergen Reactions  . Penicillins Anaphylaxis   Patient Measurements: Height: 5\' 2"  (157.5 cm) Weight: 122 lb 8 oz (55.566 kg) IBW/kg (Calculated) : 50.1  Vital Signs: Temp: 98.4 F (36.9 C) (11/10 0654) Temp src: Oral (11/10 0654) BP: 129/67 mmHg (11/10 0654) Pulse Rate: 101 (11/10 0654) Intake/Output from previous day: 11/09 0701 - 11/10 0700 In: 1126.3 [P.O.:480; I.V.:346.3; IV Piggyback:300] Out: -  Intake/Output from this shift:    Labs:  Recent Labs  11/12/12 2332 11/14/12 0605  WBC 8.0 6.7  HGB 11.2* 11.2*  PLT 328 299  CREATININE 0.64 0.52   Estimated Creatinine Clearance: 42.9 ml/min (by C-G formula based on Cr of 0.52). No results found for this basename: VANCOTROUGH, Leodis Binet, VANCORANDOM, GENTTROUGH, GENTPEAK, GENTRANDOM, TOBRATROUGH, TOBRAPEAK, TOBRARND, AMIKACINPEAK, AMIKACINTROU, AMIKACIN,  in the last 72 hours   Microbiology: Recent Results (from the past 720 hour(s))  WOUND CULTURE     Status: None   Collection Time    11/13/12 12:00 AM      Result Value Range Status   Specimen Description LEG   Final   Special Requests Normal   Final   Gram Stain PENDING   Incomplete   Culture     Final   Value: NO GROWTH 1 DAY     Performed at [facility]   Report Status PENDING   Incomplete    Anti-infectives   Start     Dose/Rate Route Frequency Ordered Stop   11/14/12 1000  levofloxacin (LEVAQUIN) tablet 500 mg     500 mg Oral Daily 11/13/12 0959     11/13/12 1800  vancomycin (VANCOCIN) IVPB 1000 mg/200 mL premix     1,000 mg 200 mL/hr over 60 Minutes Intravenous Every 24 hours 11/13/12 1000     11/13/12 0530  levofloxacin (LEVAQUIN) IVPB 500 mg     500 mg 100 mL/hr over 60 Minutes Intravenous  Once 11/13/12 0519 11/13/12 0729   11/12/12 2330  vancomycin (VANCOCIN) IVPB 1000 mg/200 mL premix     1,000 mg 200 mL/hr over  60 Minutes Intravenous  Once 11/12/12 2319 11/13/12 0131     Assessment: 79yo female with chronic wounds and cellulitis of LE reportedly from dog and rooster scratches.  SCr is at baseline.  Normalized clcr = 55-82ml/min.  Pt is small.  Afebrile with normal WBC. Wound cx negative so far.  Vancomycin 11/8 >> Levaquin 11/9 >>  Goal of Therapy:  Vancomycin trough level 10-15 mcg/ml  Plan:  Vancomycin 1000mg  IV q24hrs Check trough at steady state Levaquin 500mg  PO daily Monitor labs, renal fxn, and cultures  Valrie Hart A 11/14/2012,11:30 AM

## 2012-11-14 NOTE — Progress Notes (Addendum)
Physical Therapy Wound Treatment Patient Details  Name: Bridget Ball MRN: [id number] Date of Birth: Sep 28, 1928  Today's Date: 11/14/2012 Time: 6213-0865 Time Calculation (min): 70 min  Subjective  Subjective: pt states that she has had bilateral leg wounds for 6 months and they have not been improving...she states that she now has severe pain in both legs Patient and Family Stated Goals: full healing of both LEs Date of Onset: 05/14/12 Prior Treatments: antibiotics  Pain Score: Pain Score: 10-Worst pain ever  Wound Assessment  Wound 11/13/12 Other (Comment) Right;Lateral;Lower (Active)  Site / Wound Assessment Painful;Yellow;Black;Pink 11/14/2012  3:32 PM  % Wound base Red or Granulating 30% 11/14/2012  3:32 PM  % Wound base Yellow 65% 11/14/2012  3:32 PM  % Wound base Black 5% 11/14/2012  3:32 PM  Peri-wound Assessment Intact;Edema;Erythema (blanchable) 11/14/2012  3:32 PM  Wound Length (cm) 5.5 cm 11/14/2012  3:32 PM  Wound Width (cm) 4 cm 11/14/2012  3:32 PM  Wound Depth (cm) 0.2 cm 11/14/2012  3:32 PM  Tunneling (cm) 0 11/14/2012  3:32 PM  Undermining (cm) 0 11/14/2012  3:32 PM  Margins Unattacted edges (unapproximated) 11/14/2012  3:32 PM  Drainage Amount Scant 11/14/2012  3:32 PM  Treatment Cleansed 11/14/2012  3:32 PM  Dressing Type Compression wrap;Moist to moist;Other (Comment) 11/14/2012  3:32 PM  Dressing Changed Changed 11/14/2012  3:32 PM  Dressing Status Clean;Dry 11/14/2012  3:32 PM     Wound 11/13/12 Other (Comment) Leg Right;Medial;Lower (Active)  Site / Wound Assessment Purple;Yellow;Pink 11/14/2012  3:32 PM  % Wound base Red or Granulating 15% 11/14/2012  3:32 PM  % Wound base Yellow 75% 11/14/2012  3:32 PM  % Wound base Black 5% 11/14/2012  3:32 PM  % Wound base Other (Comment) 15% 11/13/2012  4:46 AM  Peri-wound Assessment Intact;Edema;Erythema (non-blanchable) 11/14/2012  3:32 PM  Wound Length (cm) 5 cm 11/14/2012  3:32 PM  Wound Width (cm) 1.8 cm  11/14/2012  3:32 PM  Wound Depth (cm) 0.2 cm 11/14/2012  3:32 PM  Tunneling (cm) 0 11/14/2012  3:32 PM  Undermining (cm) 0 11/14/2012  3:32 PM  Margins Unattacted edges (unapproximated) 11/14/2012  3:32 PM  Drainage Amount Scant 11/14/2012  3:32 PM  Drainage Description Serous 11/14/2012  3:32 PM  Treatment Cleansed 11/14/2012  3:32 PM  Dressing Type Compression wrap;Moist to moist;Gauze (Comment);Other (Comment) 11/14/2012  3:32 PM  Dressing Changed Changed 11/14/2012  3:32 PM  Dressing Status Clean;Dry 11/14/2012  3:32 PM     Wound 11/13/12 Other (Comment) Leg Left;Lower (Active)  Site / Wound Assessment Black;Yellow 11/14/2012  3:32 PM  % Wound base Red or Granulating 5% 11/14/2012  3:32 PM  % Wound base Yellow 35% 11/14/2012  3:32 PM  % Wound base Black 60% 11/14/2012  3:32 PM  Peri-wound Assessment Intact;Edema;Erythema (non-blanchable) 11/14/2012  3:32 PM  Wound Length (cm) 9 cm 11/14/2012  3:32 PM  Wound Width (cm) 7 cm 11/14/2012  3:32 PM  Wound Depth (cm) 0.2 cm 11/14/2012  3:32 PM  Tunneling (cm) 0 11/14/2012  3:32 PM  Undermining (cm) 0 11/14/2012  3:32 PM  Margins Unattacted edges (unapproximated) 11/14/2012  3:32 PM  Drainage Amount Scant 11/14/2012  3:32 PM  Drainage Description Serous 11/14/2012  3:32 PM  Treatment Cleansed;Debridement (Selective) 11/14/2012  3:32 PM  Dressing Type Compression wrap;Gauze (Comment);Moist to moist;Other (Comment) 11/14/2012  3:32 PM  Dressing Changed Changed 11/14/2012  3:32 PM  Dressing Status Clean;Dry;Intact 11/14/2012  8:00 AM   Selective Debridement Selective  Debridement - Location: debridement was attempted to black eschar present in wound....pt began to hyperventilate and was totally unable to tolerate this, so we stopped Selective Debridement - Tools Used: Forceps;Scissors Selective Debridement - Tissue Removed: small amount of black eschar   Wound Assessment and Plan  Wound Therapy - Assess/Plan/Recommendations Wound  Therapy - Clinical Statement: Pt is seen for evaluation of bilateral leg wounds.  Upon entering the room and introducing myself, the pt began to cry uncontrollably, worried about what I was going to do.  This undertone of her extreme fear of my hurting her definately will affect the course of treatment.  The bandages were removed very delicately and yet she c/o extreme pain.  Her LEs are edemetous with some erythema.  Wounds are covered with thick slough which should not be too difficult to remove mechanically, but not with this pt at this time.  I attempted to debride some slough with forceps and scissors but she began to hyperventilate.  Pulse Lavage could be a good option, but I am not sure that she will tolerate it.   The Santyl ointment will be the best option for debridement at this point, and this was applied to all eschar.  Moist gauze was laid over this and then covered with 4x4s and Kerlix.  I did apply ACE wraps to both LEs to add compression for reduction of edema.   We will be happy to follow as an Outpatient if desired.                     Wound Therapy - Functional Problem List: pain in both LEs limiting activity Factors Delaying/Impairing Wound Healing: Multiple medical problems Hydrotherapy Plan: Debridement;Dressing change Wound Therapy - Frequency: 6X / week Wound Therapy - Current Recommendations: PT Wound Therapy - Follow Up Recommendations: [facility] Wound Plan: cleansing with normal saline, debridement as tolerated, application of Santyl ointment to eschar, cover with moist 4x4, then dry 4x4 and kerlix. Wrap with Ace wrap for compression  Wound Therapy Goals- Improve the function of patient's integumentary system by progressing the wound(s) through the phases of wound healing (inflammation - proliferation - remodeling) by: Decrease Necrotic Tissue to: 5% overall Decrease Necrotic Tissue - Progress: Goal set today Goals/treatment plan/discharge plan were made with and  agreed upon by patient/family: Yes Time For Goal Achievement: 7 days Wound Therapy - Potential for Goals: Fair  Goals will be updated until maximal potential achieved or discharge criteria met.  Discharge criteria: when goals achieved, discharge from hospital, MD decision/surgical intervention, no progress towards goals, refusal/missing three consecutive treatments without notification or medical reason.  GP     Konrad Penta 11/14/2012, 3:58 PM

## 2012-11-14 NOTE — Progress Notes (Signed)
Utilization Review Complete  

## 2012-11-15 NOTE — Progress Notes (Signed)
NAMEDAFNA, ROMO NO.:  [id number]  MEDICAL RECORD NO.:  [id number]  LOCATION:  [facility]                          FACILITY:  [facility]  PHYSICIAN:  Melvyn Novas, MDDATE OF BIRTH:  06-16-1928  DATE OF PROCEDURE: DATE OF DISCHARGE:                                PROGRESS NOTE   The patient has excoriations of both lower extremities, some venous stasis and venous ulcer disease, unresponsive to multiple regimens of oral antibiotics.  The patient was seen by Surgery and was placed on IV vancomycin as well as p.o. Levaquin, continues to do well.  She complains of a burning, tingling, paresthesias of lower extremities along with pain.  This is responsive to Dilaudid 1-2 mg IV q.2-4h p.r.n. She seems to be doing well, and lungs are clear.  No rales, wheeze, or rhonchi. Heart regular rhythm.  No murmurs, gallops, or rubs.  Abdomen is soft and nontender.  Bowel sounds are normoactive.  Legs are as described per surgery note.  PLAN:  Right now is to continue current therapy.  We will look at ambulation, physical therapy, and possible discharge within 24-48 hours.     Melvyn Novas, MD     RMD/MEDQ  D:  11/14/2012  T:  11/15/2012  Job:  [id number]

## 2012-11-15 NOTE — Progress Notes (Signed)
Physical Therapy Wound Treatment Patient Details  Name: Bridget Ball MRN: [id number] Date of Birth: [birth year]  Today's Date: 11/15/2012 Time: 5621-3086 Time Calculation (min): 60 min  Subjective  Subjective: Pt states that she is very nervous about receiving wound care because it is so painful.  Pain Score: Pain Score: 4   Wound Assessment  Wound 11/13/12 Other (Comment) Right;Lateral;Lower (Active)  Site / Wound Assessment Painful;Yellow;Black;Pink 11/15/2012 11:24 AM  % Wound base Red or Granulating 30% 11/15/2012 11:24 AM  % Wound base Yellow 65% 11/15/2012 11:24 AM  % Wound base Black 5% 11/15/2012 11:24 AM  Peri-wound Assessment Intact;Edema;Erythema (blanchable) 11/15/2012 11:24 AM  Wound Length (cm) 5.5 cm 11/14/2012  3:32 PM  Wound Width (cm) 4 cm 11/14/2012  3:32 PM  Wound Depth (cm) 0.2 cm 11/14/2012  3:32 PM  Tunneling (cm) 0 11/14/2012  3:32 PM  Undermining (cm) 0 11/14/2012  3:32 PM  Margins Unattacted edges (unapproximated) 11/15/2012 11:24 AM  Drainage Amount Scant 11/15/2012 11:24 AM  Treatment Cleansed;Debridement (Selective) 11/15/2012 11:24 AM  Dressing Type Compression wrap;Moist to moist;Other (Comment) 11/15/2012 11:24 AM  Dressing Changed Changed 11/15/2012 11:24 AM  Dressing Status Clean;Dry;Intact 11/15/2012 11:24 AM     Wound 11/13/12 Other (Comment) Leg Right;Medial;Lower (Active)  Site / Wound Assessment Purple;Yellow;Pink 11/15/2012 11:24 AM  % Wound base Red or Granulating 20% 11/15/2012 11:24 AM  % Wound base Yellow 80% 11/15/2012 11:24 AM  % Wound base Black 0% 11/15/2012 11:24 AM  % Wound base Other (Comment) 15% 11/13/2012  4:46 AM  Peri-wound Assessment Intact;Edema;Erythema (non-blanchable) 11/15/2012 11:24 AM  Wound Length (cm) 5 cm 11/14/2012  3:32 PM  Wound Width (cm) 1.8 cm 11/14/2012  3:32 PM  Wound Depth (cm) 0.2 cm 11/14/2012  3:32 PM  Tunneling (cm) 0 11/14/2012  3:32 PM  Undermining (cm) 0 11/14/2012  3:32 PM  Margins  Unattacted edges (unapproximated) 11/15/2012 11:24 AM  Drainage Amount Scant 11/15/2012 11:24 AM  Drainage Description Serous 11/15/2012 11:24 AM  Treatment Cleansed;Debridement (Selective) 11/15/2012 11:24 AM  Dressing Type Compression wrap;Moist to moist;Gauze (Comment);Other (Comment) 11/15/2012 11:24 AM  Dressing Changed Changed 11/15/2012 11:24 AM  Dressing Status Clean;Dry 11/15/2012 11:24 AM     Wound 11/13/12 Other (Comment) Leg Left;Lower (Active)  Site / Wound Assessment Black;Yellow 11/15/2012 11:24 AM  % Wound base Red or Granulating 5% 11/15/2012 11:24 AM  % Wound base Yellow 85% 11/15/2012 11:24 AM  % Wound base Black 10% 11/15/2012 11:24 AM  Peri-wound Assessment Intact;Edema;Erythema (non-blanchable) 11/15/2012 11:24 AM  Wound Length (cm) 9 cm 11/14/2012  3:32 PM  Wound Width (cm) 7 cm 11/14/2012  3:32 PM  Wound Depth (cm) 0.2 cm 11/14/2012  3:32 PM  Tunneling (cm) 0 11/14/2012  3:32 PM  Undermining (cm) 0 11/14/2012  3:32 PM  Margins Unattacted edges (unapproximated) 11/15/2012 11:24 AM  Drainage Amount Scant 11/15/2012 11:24 AM  Drainage Description Serous 11/15/2012 11:24 AM  Treatment Cleansed;Debridement (Selective) 11/15/2012 11:24 AM  Dressing Type Compression wrap;Gauze (Comment);Moist to moist;Other (Comment) 11/15/2012 11:24 AM  Dressing Changed Changed 11/14/2012  3:32 PM  Dressing Status Clean;Dry;Intact 11/15/2012  8:00 AM   Selective Debridement Selective Debridement - Location: Pt displays improved tolerance for very gentle debridement with scalpel and forceps. Necrocic tissue that was not removed was scored with scalpel. Selective Debridement - Tools Used: Forceps;Scalpel Selective Debridement - Tissue Removed: Slough, eschar and dry skin   Wound Assessment and Plan  Wound Therapy - Assess/Plan/Recommendations Wound Therapy - Clinical Statement: Pt displasy improved toelrance  for gentle debridement with scalpel and forceps. Before debridement was  started leg was washed with saline and 4x4. This seemed to lower pt's anxiety and desensitize LE. Pt is most sensitive to decridemetn of LLE wound. Swelling and necrotic tissue have both decreased. Wound Plan: Cleansing with normal saline, debridement as tolerated, application of Santyl ointment to eschar, cover with moist 4x4, then dry 4x4 and kerlix. Wrap with Ace wrap for compression.  Wound Therapy Goals- Improve the function of patient's integumentary system by progressing the wound(s) through the phases of wound healing (inflammation - proliferation - remodeling) by:  Goals will be updated until maximal potential achieved or discharge criteria met.  Discharge criteria: when goals achieved, discharge from hospital, MD decision/surgical intervention, no progress towards goals, refusal/missing three consecutive treatments without notification or medical reason.  Seth Bake, PTA  11/15/2012, 11:33 AM

## 2012-11-15 NOTE — Progress Notes (Signed)
[id number] 

## 2012-11-15 NOTE — Progress Notes (Signed)
Physical therapy has seen patient for care of lower chin the wounds. After dressing change today, patient may be discharged and treated as an outpatient through the physical therapy department for wound care. I will follow her as an outpatient. Agree with oral antibiotic for 2 weeks.

## 2012-11-15 NOTE — Care Management Note (Unsigned)
    Page 1 of 1   11/15/2012     3:44:16 PM   CARE MANAGEMENT NOTE 11/15/2012  Patient:  Bridget Ball, Bridget Ball   Account Number:  [id number]  Date Initiated:  11/15/2012  Documentation initiated by:  Rosemary Holms  Subjective/Objective Assessment:   Pt lives at home with her daughter. A different daughter is at bedside who states she can get wound care as an outpt and she will assist with transfer. Wound care qd.     Action/Plan:   Will assist in setting up wound care at [facility]   Anticipated DC Date:  11/16/2012   Anticipated DC Plan:  HOME/SELF CARE      DC Planning Services  CM consult      Choice offered to / List presented to:             Status of service:  In process, will continue to follow Medicare Important Message given?   (If response is "NO", the following Medicare IM given date fields will be blank) Date Medicare IM given:   Date Additional Medicare IM given:    Discharge Disposition:    Per UR Regulation:    If discussed at Long Length of Stay Meetings, dates discussed:    Comments:  11/15/12 Rosemary Holms RN BSN CM

## 2012-11-16 LAB — WOUND CULTURE
Gram Stain: NONE SEEN
Special Requests: NORMAL

## 2012-11-16 MED ORDER — LORAZEPAM 0.5 MG PO TABS: 0.5000 mg | ORAL_TABLET | Freq: Three times a day (TID) | ORAL | Status: DC | PRN

## 2012-11-16 MED ORDER — THIAMINE HCL 100 MG PO TABS: 100.0000 mg | ORAL_TABLET | Freq: Every day | ORAL | Status: DC

## 2012-11-16 MED ORDER — COLLAGENASE 250 UNIT/GM EX OINT: TOPICAL_OINTMENT | Freq: Every day | CUTANEOUS | Status: DC

## 2012-11-16 MED ORDER — GABAPENTIN 300 MG PO CAPS: 300.0000 mg | ORAL_CAPSULE | Freq: Three times a day (TID) | ORAL | Status: DC

## 2012-11-16 MED ORDER — HYDROMORPHONE HCL 4 MG PO TABS: 4.0000 mg | ORAL_TABLET | Freq: Four times a day (QID) | ORAL | Status: DC | PRN

## 2012-11-16 MED ORDER — LEVOFLOXACIN 500 MG PO TABS: 500.0000 mg | ORAL_TABLET | Freq: Every day | ORAL | Status: DC

## 2012-11-16 NOTE — Discharge Summary (Signed)
[id number] 

## 2012-11-16 NOTE — Progress Notes (Signed)
Physical Therapy Wound Treatment Patient Details  Name: Bridget Ball MRN: [id number] Date of Birth: [birth year]  Today's Date: 11/16/2012 Time: 4098-1191 Time Calculation (min): 50 min  Subjective  Subjective: Pt states that her legs feel itchy and she's dying to scratch them.  Pain Score: Pain Score:  (8/10 prior to wound care; 4/10 after)  Wound Assessment  Wound 11/13/12 Other (Comment) Right;Lateral;Lower (Active)  Site / Wound Assessment Painful;Yellow;Black;Pink 11/16/2012 11:24 AM  % Wound base Red or Granulating 35% 11/16/2012 11:24 AM  % Wound base Yellow 60% 11/16/2012 11:24 AM  % Wound base Black 5% 11/16/2012 11:24 AM  Peri-wound Assessment Intact;Edema;Erythema (blanchable) 11/16/2012 11:24 AM  Wound Length (cm) 5.5 cm 11/14/2012  3:32 PM  Wound Width (cm) 4 cm 11/14/2012  3:32 PM  Wound Depth (cm) 0.2 cm 11/14/2012  3:32 PM  Tunneling (cm) 0 11/14/2012  3:32 PM  Undermining (cm) 0 11/14/2012  3:32 PM  Margins Unattacted edges (unapproximated) 11/16/2012 11:24 AM  Drainage Amount Scant 11/16/2012 11:24 AM  Treatment Cleansed;Debridement (Selective) 11/16/2012 11:24 AM  Dressing Type Compression wrap;Moist to moist;Other (Comment) 11/16/2012 11:24 AM  Dressing Changed Changed 11/16/2012 11:24 AM  Dressing Status Clean;Dry;Intact 11/16/2012 11:24 AM     Wound 11/13/12 Other (Comment) Leg Right;Medial;Lower (Active)  Site / Wound Assessment Purple;Yellow;Pink 11/16/2012 11:24 AM  % Wound base Red or Granulating 20% 11/16/2012 11:24 AM  % Wound base Yellow 80% 11/16/2012 11:24 AM  % Wound base Black 0% 11/16/2012 11:24 AM  % Wound base Other (Comment) 15% 11/13/2012  4:46 AM  Peri-wound Assessment Intact;Edema;Erythema (non-blanchable) 11/16/2012 11:24 AM  Wound Length (cm) 5 cm 11/14/2012  3:32 PM  Wound Width (cm) 1.8 cm 11/14/2012  3:32 PM  Wound Depth (cm) 0.2 cm 11/14/2012  3:32 PM  Tunneling (cm) 0 11/14/2012  3:32 PM  Undermining (cm) 0 11/14/2012  3:32 PM   Margins Unattacted edges (unapproximated) 11/16/2012 11:24 AM  Drainage Amount Scant 11/16/2012 11:24 AM  Drainage Description Serous 11/16/2012 11:24 AM  Treatment Cleansed;Debridement (Selective) 11/16/2012 11:24 AM  Dressing Type Compression wrap;Moist to moist;Gauze (Comment);Other (Comment) 11/16/2012 11:24 AM  Dressing Changed Changed 11/16/2012 11:24 AM  Dressing Status Clean;Dry;Intact 11/16/2012 11:24 AM     Wound 11/13/12 Other (Comment) Leg Left;Lower (Active)  Site / Wound Assessment Black;Yellow 11/16/2012 11:24 AM  % Wound base Red or Granulating 15% 11/16/2012 11:24 AM  % Wound base Yellow 80% 11/16/2012 11:24 AM  % Wound base Black 5% 11/16/2012 11:24 AM  Peri-wound Assessment Intact;Edema;Erythema (non-blanchable) 11/16/2012 11:24 AM  Wound Length (cm) 9 cm 11/14/2012  3:32 PM  Wound Width (cm) 7 cm 11/14/2012  3:32 PM  Wound Depth (cm) 0.2 cm 11/14/2012  3:32 PM  Tunneling (cm) 0 11/14/2012  3:32 PM  Undermining (cm) 0 11/14/2012  3:32 PM  Margins Unattacted edges (unapproximated) 11/16/2012 11:24 AM  Drainage Amount Scant 11/16/2012 11:24 AM  Drainage Description Serous 11/16/2012 11:24 AM  Treatment Cleansed;Debridement (Selective) 11/16/2012 11:24 AM  Dressing Type Compression wrap;Gauze (Comment);Moist to moist;Other (Comment) 11/16/2012 11:24 AM  Dressing Changed Changed 11/16/2012 11:24 AM  Dressing Status Clean;Dry;Intact 11/16/2012 11:24 AM   Selective Debridement Selective Debridement - Location: Throughout each wound. Selective Debridement - Tools Used: Forceps;Scalpel Selective Debridement - Tissue Removed: Slough, eschar and dry skin   Wound Assessment and Plan  Wound Therapy - Assess/Plan/Recommendations Wound Therapy - Clinical Statement: Wounds continue to respond well to Santyl. Increased granulation noted. Pt continues to be very sensitive to debridement. Instructed pt on importance  of moving legs and ankle to maintain strength and ROM. Pt  reports pain decrease from 8/10 to 4/10 at after debridement. Wound Plan: Cleansing with normal saline, debridement as tolerated, application of Santyl ointment to eschar, cover with moist 4x4, then dry 4x4 and kerlix. Wrap with Ace wrap for compression.    Seth Bake, PTA  11/16/2012, 11:48 AM

## 2012-11-16 NOTE — Progress Notes (Signed)
Pt and her daughter verbalize understanding of d/c instructions, medications and follow up care w/outpatient PT. PT debt should contact pt, I informed pt to contact PT if she hadn't heard from them by in the morning. Pt has no questions at this time. IV was d/c without complications. Pt d/c via wheelchair accompanied by staff member. Pts daughter will be driving her home. Sheryn Bison

## 2012-11-17 ENCOUNTER — Ambulatory Visit (HOSPITAL_COMMUNITY)
Admission: RE | Admit: 2012-11-17 | Discharge: 2012-11-17 | Disposition: A | Discharge status: Home / Self Care | Payer: Medicare Other | Source: Ambulatory Visit | Attending: Family Medicine | Admitting: Family Medicine

## 2012-11-17 DIAGNOSIS — IMO0001 Reserved for inherently not codable concepts without codable children: Secondary | ICD-10-CM | POA: Insufficient documentation

## 2012-11-17 DIAGNOSIS — S81009A Unspecified open wound, unspecified knee, initial encounter: Secondary | ICD-10-CM | POA: Insufficient documentation

## 2012-11-17 DIAGNOSIS — L02419 Cutaneous abscess of limb, unspecified: Secondary | ICD-10-CM | POA: Insufficient documentation

## 2012-11-17 NOTE — Progress Notes (Addendum)
Physical Therapy - Wound Therapy  Evaluation   Patient Details  Name: Bridget Ball MRN: [id number] Date of Birth: Jul 25, 1928  Today's Date: 11/17/2012 Time:  -   1515-1605 eval  Visit#: 1 of 18  Re-eval: 12/17/12 Charge:  evaluation Subjective Subjective Assessment Subjective: Bridget Ball is a 79 y.o. female with a past medical history of TIA, COPD, who was in her usual state of health till June, when she had one of her legs, scratched by a dog and the other scratched by a rooster. She developed wounds on both her legs as a result. She has been cleaning and dressing these wounds since June and has had multiple courses of antibiotics. However, the wounds have not healed. The pain has been getting progressively worse. She was prescribed Vicodin 2 weeks ago by her PCP, but it's not alleviating the pain. The legs have been warm to touch. The left leg is worse than the right. The pain is 10 out of 10 in intensity and is worse with walking. She has also noticed worsening leg swelling and burning sensation in these legs. Denies any fever. She has also noted yellowish green discharge from the wounds, left more than the right. Some bleeding has also been seen from these wounds but not in large amounts.  She was admitted into the hospital on  11/13/2012 and discharged on 11/16/2012 she is now being referred to PT for wound care. Patient and Family Stated Goals: full healing of both LEs Date of Onset: 05/14/12 Prior Treatments: antibiotics  Pain Assessment   10/10 Wound Therapy Wound 11/13/12 Other (Comment) Right;Lateral;Lower (Active)  Site / Wound Assessment Yellow;Red 11/17/2012  4:30 PM  % Wound base Red or Granulating 20% 11/17/2012  4:30 PM  % Wound base Yellow 80% 11/17/2012  4:30 PM  % Wound base Black 0% 11/17/2012  4:30 PM  Peri-wound Assessment Intact;Erythema (blanchable) 11/17/2012  4:30 PM  Wound Length (cm) 5.5 cm 11/17/2012  4:30 PM  Wound Width (cm) 2.8 cm 11/17/2012  4:30  PM  Wound Depth (cm) 0.3 cm 11/17/2012  4:30 PM  Tunneling (cm) 0 11/14/2012  3:32 PM  Undermining (cm) 0 11/14/2012  3:32 PM  Margins Unattacted edges (unapproximated) 11/17/2012  4:30 PM  Drainage Amount Scant 11/17/2012  4:30 PM  Treatment Debridement (Selective);Cleansed 11/17/2012  4:30 PM  Dressing Type Compression wrap;Moist to moist;Other (Comment) 11/17/2012  4:30 PM  Dressing Changed Changed 11/17/2012  4:30 PM  Dressing Status Clean 11/17/2012  4:30 PM     Wound 11/13/12 Other (Comment) Leg Right;Medial;Lower (Active)  Site / Wound Assessment Purple;Yellow;Pink 11/16/2012 11:24 AM  % Wound base Red or Granulating 20% 11/17/2012  4:30 PM  % Wound base Yellow 80% 11/17/2012  4:30 PM  % Wound base Black 0% 11/17/2012  4:30 PM  % Wound base Other (Comment) 0% 11/17/2012  4:30 PM  Peri-wound Assessment Intact;Edema;Erythema (non-blanchable) 11/17/2012  4:30 PM  Wound Length (cm) 4 cm 11/17/2012  4:30 PM  Wound Width (cm) 1.8 cm 11/17/2012  4:30 PM  Wound Depth (cm) 0.3 cm 11/17/2012  4:30 PM  Tunneling (cm) 0 11/14/2012  3:32 PM  Undermining (cm) 0 11/14/2012  3:32 PM  Margins Unattacted edges (unapproximated) 11/16/2012 11:24 AM  Drainage Amount Scant 11/17/2012  4:30 PM  Drainage Description Purulent 11/17/2012  4:30 PM  Non-staged Wound Description Full thickness 11/17/2012  4:30 PM  Treatment Cleansed;Debridement (Selective) 11/17/2012  4:30 PM  Dressing Type Compression wrap;Moist to moist;Gauze (Comment);Other (Comment) 11/17/2012  4:30  PM  Dressing Changed Changed 11/16/2012 11:24 AM  Dressing Status Clean;Dry;Intact 11/17/2012  4:30 PM     Wound 11/13/12 Other (Comment) Leg Left;Lower (Active)  Site / Wound Assessment Yellow;Red 11/17/2012  4:30 PM  % Wound base Red or Granulating 20% 11/17/2012  4:30 PM  % Wound base Yellow 80% 11/17/2012  4:30 PM  % Wound base Black 0% 11/17/2012  4:30 PM  % Wound base Other (Comment) 0% 11/17/2012  4:30 PM  Peri-wound Assessment  Intact;Edema;Erythema (non-blanchable) 11/17/2012  4:30 PM  Wound Length (cm) 9 cm 11/17/2012  4:30 PM  Wound Width (cm) 4 cm 11/17/2012  4:30 PM  Wound Depth (cm) 0.4 cm 11/17/2012  4:30 PM  Tunneling (cm) 0 11/14/2012  3:32 PM  Undermining (cm) 0 11/14/2012  3:32 PM  Margins Unattacted edges (unapproximated) 11/17/2012  4:30 PM  Drainage Amount Scant 11/17/2012  4:30 PM  Drainage Description Purulent 11/17/2012  4:30 PM  Treatment Cleansed;Debridement (Selective) 11/17/2012  4:30 PM  Dressing Type Compression wrap;Gauze (Comment);Moist to moist;Other (Comment) 11/17/2012  4:30 PM  Dressing Changed Changed 11/16/2012 11:24 AM  Dressing Status Clean;Dry;Intact 11/16/2012 11:24 AM   Selective Debridement Selective Debridement - Location: wound bed of each Selective Debridement - Tools Used: Forceps;Scalpel;Scissors Selective Debridement - Tissue Removed: slough ; eschar    Physical Therapy Assessment and Plan Wound Therapy - Assess/Plan/Recommendations Wound Therapy - Clinical Statement: Wounds continue to respond well to Santyl. Increased granulation noted. Pt continues to be very sensitive to debridement. Instructed pt on importance of moving legs and ankle to maintain strength and ROM. Pt reports pain decrease from 8/10 to 4/10 at after debridement. Wound Therapy - Functional Problem List: pain in both LEs limiting activity Factors Delaying/Impairing Wound Healing: Multiple medical problems Hydrotherapy Plan: Debridement;Dressing change Wound Therapy - Frequency: 3X / week (x6 weeks) Wound Therapy - Current Recommendations: PT Wound Plan: Cleansing with normal saline, debridement as tolerated, application of Santyl ointment to eschar, cover with moist 4x4, then dry 4x4 and kerlix. Wrap with Ace wrap for compression.      Goals Wound Therapy Goals - Improve the function of patient's integumentary system by progressing the wound(s) through the phases of wound healing by: Decrease  Necrotic Tissue to: 0 Decrease Necrotic Tissue - Progress: Goal set today Increase Granulation Tissue to: 100 Increase Granulation Tissue - Progress: Goal set today Decrease Length/Width/Depth by (cm): 50% Improve Drainage Characteristics: Min Improve Drainage Characteristics - Progress: Goal set today Patient/Family will be able to : I in wound dressing Patient/Family Instruction Goal - Progress: Goal set today Time For Goal Achievement:  (6 weeks) Wound Therapy - Potential for Goals: Good Goal:   Pain to be 2/10 or less 80% of the day. Problem List Patient Active Problem List   Diagnosis Date Noted  . Bilateral lower leg cellulitis 11/13/2012  . Open wound of leg 11/13/2012  . Oral thrush 11/13/2012    GP Functional Assessment Tool Used: clinical judgement Functional Limitation: Other PT primary Other PT Primary Current Status (Z6109): At least 80 percent but less than 100 percent impaired, limited or restricted Other PT Primary Goal Status (U0454): At least 1 percent but less than 20 percent impaired, limited or restricted  RUSSELL,CINDY 11/17/2012, 7:00 PM   Your signature is required to indicate approval of the treatment plan as stated above.  Please sign and return making a copy for your files.  You may hard copy or send electronically.  Please check one: __x_1.  Approve of this plan  ___2Kittie Plater of this plan with the following changes.   ____________________________                             _____________ Physician                                                                      Date

## 2012-11-18 ENCOUNTER — Ambulatory Visit (HOSPITAL_COMMUNITY): Payer: Medicare Other | Admitting: Physical Therapy

## 2012-11-18 NOTE — Progress Notes (Signed)
Bridget Ball, Bridget Ball NO.:  [id number]  MEDICAL RECORD NO.:  [id number]  LOCATION:  A333                          FACILITY:  [facility]  PHYSICIAN:  Melvyn Novas, MDDATE OF BIRTH:  [birth year]  DATE OF PROCEDURE: DATE OF DISCHARGE:                                PROGRESS NOTE   The patient has bilateral lower leg excoriations with venous ulcer disease, stasis dermatitis unresponsive to several oral antibiotics. She is seen by Surgery, wound was debrided, placed on IV vancomycin as well as p.o. Levaquin.  PHYSICAL EXAMINATION:  LUNGS:  Clear.  No rales, wheeze, or rhonchi. HEART:  Unremarkable. ABDOMEN:  Soft, nontender.  Bowel sounds normoactive.  No guarding, rebound, mass, or megaly. VITAL SIGNS:  Blood pressure is 125/83, temperature 98.4, pulse is 94 and regular, O2 sat 95%.  Hemoglobin 11.2 and white count 6.7.  PLAN:  Right now is continue Ensure, Levaquin 500 p.o. daily, vancomycin IV, and collagenase ointment.  She will be discharged hopefully in 1 day's time.     Melvyn Novas, MD     RMD/MEDQ  D:  11/15/2012  T:  11/16/2012  Job:  [id number]

## 2012-11-18 NOTE — Discharge Summary (Signed)
Bridget Ball, Bridget Ball NO.:  [id number]  MEDICAL RECORD NO.:  [id number]  LOCATION:  A333                          FACILITY:  [facility]  PHYSICIAN:  Melvyn Novas, MDDATE OF BIRTH:  September 14, 1928  DATE OF ADMISSION:  11/12/2012 DATE OF DISCHARGE:  11/12/2014LH                              DISCHARGE SUMMARY   The patient 79 year old female with history of peripheral vascular disease, history of TIA, COPD who has bilateral lower excoriations of lower extremities due to venous stasis disease.  She has documented Doppler arterials in the office showing good arterial flow.  She is admitted to the hospital, placed on IV vancomycin for several days, seen in consultation by Surgery, who debrided some of her wounds.  She was likewise placed on p.o. Levaquin and had some physical therapy.  She has some generalized weakness.  She complains of burning, tingling, paresthesias of legs, and frank pain for which she was given Dilaudid as well as Neurontin.  She responded partially to this pain regimen and p.o. and IV antibiotics, was subsequently ready to be discharged, follow up with home health and physical therapy.  She was discharged on the following medicines; collagenase ointment to wounds once a day, Levaquin 500 mg p.o. daily for 14 days, lorazepam 0.5 mg p.o. at bedtime, thiamin 100 mg p.o. daily, Neurontin 300 mg p.o. t.i.d., and Dilaudid 4 mg p.o. q.6h p.r.n., #45.  She likewise has a Proventil inhaler at home, which I will sample her in the office.     Melvyn Novas, MD     RMD/MEDQ  D:  11/16/2012  T:  11/16/2012  Job:  [id number]

## 2012-11-19 ENCOUNTER — Encounter (HOSPITAL_COMMUNITY): Payer: Self-pay | Admitting: Emergency Medicine

## 2012-11-19 ENCOUNTER — Emergency Department (HOSPITAL_COMMUNITY): Payer: Medicare Other

## 2012-11-19 ENCOUNTER — Inpatient Hospital Stay (HOSPITAL_COMMUNITY): Payer: Medicare Other

## 2012-11-19 ENCOUNTER — Inpatient Hospital Stay (HOSPITAL_COMMUNITY)
Admission: EM | Admit: 2012-11-19 | Discharge: 2012-11-22 | DRG: 065 | Disposition: A | Discharge status: Home / Self Care | Payer: Medicare Other | Attending: Neurology | Admitting: Neurology

## 2012-11-19 DIAGNOSIS — M129 Arthropathy, unspecified: Secondary | ICD-10-CM | POA: Diagnosis present

## 2012-11-19 DIAGNOSIS — I609 Nontraumatic subarachnoid hemorrhage, unspecified: Principal | ICD-10-CM

## 2012-11-19 DIAGNOSIS — J449 Chronic obstructive pulmonary disease, unspecified: Secondary | ICD-10-CM | POA: Diagnosis present

## 2012-11-19 DIAGNOSIS — J4489 Other specified chronic obstructive pulmonary disease: Secondary | ICD-10-CM | POA: Diagnosis present

## 2012-11-19 DIAGNOSIS — L03115 Cellulitis of right lower limb: Secondary | ICD-10-CM | POA: Diagnosis present

## 2012-11-19 DIAGNOSIS — Z8673 Personal history of transient ischemic attack (TIA), and cerebral infarction without residual deficits: Secondary | ICD-10-CM

## 2012-11-19 DIAGNOSIS — G459 Transient cerebral ischemic attack, unspecified: Secondary | ICD-10-CM

## 2012-11-19 DIAGNOSIS — Z823 Family history of stroke: Secondary | ICD-10-CM

## 2012-11-19 DIAGNOSIS — S81809A Unspecified open wound, unspecified lower leg, initial encounter: Secondary | ICD-10-CM

## 2012-11-19 DIAGNOSIS — Z880 Allergy status to penicillin: Secondary | ICD-10-CM

## 2012-11-19 DIAGNOSIS — L02419 Cutaneous abscess of limb, unspecified: Secondary | ICD-10-CM | POA: Diagnosis present

## 2012-11-19 DIAGNOSIS — R569 Unspecified convulsions: Secondary | ICD-10-CM | POA: Diagnosis present

## 2012-11-19 DIAGNOSIS — Z9181 History of falling: Secondary | ICD-10-CM

## 2012-11-19 DIAGNOSIS — Z87891 Personal history of nicotine dependence: Secondary | ICD-10-CM

## 2012-11-19 DIAGNOSIS — IMO0002 Reserved for concepts with insufficient information to code with codable children: Secondary | ICD-10-CM

## 2012-11-19 LAB — COMPREHENSIVE METABOLIC PANEL
ALT: 19 U/L (ref 0–35)
AST: 20 U/L (ref 0–37)
Albumin: 3.6 g/dL (ref 3.5–5.2)
Alkaline Phosphatase: 68 U/L (ref 39–117)
BUN: 15 mg/dL (ref 6–23)
Calcium: 9.9 mg/dL (ref 8.4–10.5)
Creatinine, Ser: 0.68 mg/dL (ref 0.50–1.10)
GFR calc Af Amer: >90 mL/min (ref >90)
Glucose, Bld: 85 mg/dL (ref 70–99)
Sodium: 138 mEq/L (ref 135–145)
Total Protein: 7.1 g/dL (ref 6.0–8.3)

## 2012-11-19 LAB — RAPID URINE DRUG SCREEN, HOSP PERFORMED
Amphetamines: NOT DETECTED
Barbiturates: NOT DETECTED
Benzodiazepines: NOT DETECTED
Cocaine: NOT DETECTED
Opiates: NOT DETECTED

## 2012-11-19 LAB — DIFFERENTIAL
Basophils Relative: 0 % (ref 0–1)
Eosinophils Absolute: 0.2 10*3/uL (ref 0.0–0.7)
Eosinophils Relative: 3 % (ref 0–5)
Lymphs Abs: 1 10*3/uL (ref 0.7–4.0)

## 2012-11-19 LAB — PROTIME-INR: Prothrombin Time: 12.9 seconds (ref 11.6–15.2)

## 2012-11-19 LAB — CBC
HCT: 40.7 % (ref 36.0–46.0)
MCH: 31.4 pg (ref 26.0–34.0)
MCV: 96.2 fL (ref 78.0–100.0)
Platelets: 376 10*3/uL (ref 150–400)
RBC: 4.23 MIL/uL (ref 3.87–5.11)

## 2012-11-19 LAB — TROPONIN I: Troponin I: <0.3 ng/mL (ref <0.30)

## 2012-11-19 LAB — URINALYSIS, ROUTINE W REFLEX MICROSCOPIC
Bilirubin Urine: NEGATIVE
Glucose, UA: NEGATIVE mg/dL
Hgb urine dipstick: NEGATIVE
Ketones, ur: NEGATIVE mg/dL
Nitrite: NEGATIVE
Protein, ur: NEGATIVE mg/dL
pH: 6 (ref 5.0–8.0)

## 2012-11-19 LAB — ETHANOL: Alcohol, Ethyl (B): <11 mg/dL (ref 0–11)

## 2012-11-19 MED ORDER — LABETALOL HCL 5 MG/ML IV SOLN: 10.0000 mg | INTRAVENOUS | Status: DC | PRN

## 2012-11-19 MED ORDER — ACETAMINOPHEN 325 MG PO TABS
650.0000 mg | ORAL_TABLET | ORAL | Status: DC | PRN
  Administered 2012-11-20 – 2012-11-21 (×2): 650 mg via ORAL
  Filled 2012-11-19 (×2): qty 2

## 2012-11-19 MED ORDER — SENNOSIDES-DOCUSATE SODIUM 8.6-50 MG PO TABS
1.0000 | ORAL_TABLET | Freq: Every evening | ORAL | Status: DC | PRN
  Filled 2012-11-19: qty 1

## 2012-11-19 MED ORDER — VITAMIN B-1 100 MG PO TABS
100.0000 mg | ORAL_TABLET | Freq: Every day | ORAL | Status: DC
  Administered 2012-11-20 – 2012-11-22 (×3): 100 mg via ORAL
  Filled 2012-11-19 (×3): qty 1

## 2012-11-19 MED ORDER — SODIUM CHLORIDE 0.9 % IV SOLN
1000.0000 mg | Freq: Once | INTRAVENOUS | Status: AC
  Administered 2012-11-19: 1000 mg via INTRAVENOUS
  Filled 2012-11-19: qty 10

## 2012-11-19 MED ORDER — LEVETIRACETAM 500 MG PO TABS
500.0000 mg | ORAL_TABLET | Freq: Two times a day (BID) | ORAL | Status: DC
  Administered 2012-11-20: 500 mg via ORAL
  Filled 2012-11-19 (×2): qty 1

## 2012-11-19 MED ORDER — IOHEXOL 350 MG/ML SOLN
50.0000 mL | Freq: Once | INTRAVENOUS | Status: AC | PRN
  Administered 2012-11-19: 50 mL via INTRAVENOUS

## 2012-11-19 MED ORDER — COLLAGENASE 250 UNIT/GM EX OINT
TOPICAL_OINTMENT | Freq: Every day | CUTANEOUS | Status: DC
  Administered 2012-11-21: 11:00:00 via TOPICAL
  Administered 2012-11-22: 1 via TOPICAL
  Filled 2012-11-19: qty 30

## 2012-11-19 MED ORDER — ACETAMINOPHEN 650 MG RE SUPP: 650.0000 mg | RECTAL | Status: DC | PRN

## 2012-11-19 NOTE — ED Notes (Signed)
Was in the hospital.  Just released 2 days ago for lower extremity wounds

## 2012-11-19 NOTE — ED Notes (Signed)
Last night had episode left side of body went numb.  Lasted 30 min.  Around 9 am,  Left hand went numb and pt was aphasic.  Episode lasted around 45 min.  And then 30 min ago had the same symptoms. Left arm numbness and aphasia.  Now no symptoms at all.

## 2012-11-19 NOTE — ED Provider Notes (Signed)
CSN: [id number]     Arrival date & time 11/19/12  1519 History  This chart was scribed for Glynn Octave, MD by Ardelia Mems, ED Scribe. This patient was seen in room [facility] and the patient's care was started at 3:26 PM.   Chief Complaint  Patient presents with  . Numbness  . Aphasia    The history is provided by the patient. No language interpreter was used.    HPI Comments: Bridget Ball is a 79 y.o. female with a history of TIA (about 5 years ago, per daughter) who presents to the Emergency Department, accompanied by a relative, complaining of 3 episodes of left arm numbness with associated aphasia since last night. She states that the first episode occurred last night and lasted about 30 minutes. She states that she had another episodes at 9 AM this morning, and a 3rd episode at 1 hour ago. Daughter states that all of these episodes were roughly 30 minutes in duration. Pt states that she is not currently having any symptoms. She denies having a history of DM.  Pt was just released from the hospital 2 days ago for lower extremity wounds. Daughter states that pt is not taking Simvastatin or aspirin as she is supposed to be taking. Pt denies headache, chest pain, dizziness, lightheadedness or any other symptoms.  PCP- Dr. Oval Linsey   Past Medical History  Diagnosis Date  . COPD (chronic obstructive pulmonary disease)   . Arthritis   . Stroke     TIA four years ago   Past Surgical History  Procedure Laterality Date  . Appendectomy    . Skin cancer excision    . Tubal ligation     Family History  Problem Relation Age of Onset  . Diabetes Mother    History  Substance Use Topics  . Smoking status: Former Smoker -- 1.00 packs/day for 50 years    Quit date: 11/13/1997  . Smokeless tobacco: Not on file  . Alcohol Use: Yes     Comment: 2 glasses a night   OB History   Grav Para Term Preterm Abortions TAB SAB Ect Mult Living                 Review of Systems A  complete 10 system review of systems was obtained and all systems are negative except as noted in the HPI and PMH.   Allergies  Penicillins  Home Medications   No current outpatient prescriptions on file. BP 103/63  Pulse 81  Temp(Src) 98.1 F (36.7 C) (Oral)  Resp 27  Ht 5\' 1"  (1.549 m)  Wt 119 lb 0.8 oz (54 kg)  BMI 22.51 kg/m2  SpO2 93%  Physical Exam  Nursing note and vitals reviewed. Constitutional: She is oriented to person, place, and time. She appears well-developed and well-nourished. No distress.  HENT:  Head: Normocephalic and atraumatic.  Eyes: EOM are normal.  Neck: Neck supple. No tracheal deviation present.  Cardiovascular: Normal rate, regular rhythm and intact distal pulses.   No murmur heard. Pulmonary/Chest: Effort normal. No respiratory distress.  Abdominal: Soft. There is no tenderness.  Musculoskeletal: Normal range of motion.  Neurological: She is alert and oriented to person, place, and time.  CN 2-12 intact, no ataxia on finger to nose, no nystagmus, 5/5 strength throughout, no pronator drift, Romberg negative, normal gait. Visual fields full to confrontation. Normal speech. No dysarthria.   Skin: Skin is warm and dry.  Psychiatric: She has a normal mood and  affect. Her behavior is normal.    ED Course  Procedures (including critical care time)  DIAGNOSTIC STUDIES: Oxygen Saturation is 94% on RA, adequate by my interpretation.    COORDINATION OF CARE: 3:34 PM- Pt and relative advised of plan for treatment and they agree.  Labs Review Labs Reviewed  COMPREHENSIVE METABOLIC PANEL - Abnormal; Notable for the following:    Total Bilirubin 0.2 (*)    GFR calc non Af Amer 79 (*)    All other components within normal limits  GLUCOSE, CAPILLARY - Abnormal; Notable for the following:    Glucose-Capillary 101 (*)    All other components within normal limits  MRSA PCR SCREENING  ETHANOL  PROTIME-INR  APTT  CBC  DIFFERENTIAL  TROPONIN I   URINE RAPID DRUG SCREEN (HOSP PERFORMED)  URINALYSIS, ROUTINE W REFLEX MICROSCOPIC   Imaging Review Ct Head Wo Contrast  11/19/2012   CLINICAL DATA:  Numbness and aphasia.  EXAM: CT HEAD WITHOUT CONTRAST  TECHNIQUE: Contiguous axial images were obtained from the base of the skull through the vertex without intravenous contrast.  COMPARISON:  01/23/2008  FINDINGS: There is high density within a sulcus in the the right posterior frontal lobe, consistent with small subarachnoid hemorrhage.  There is mild central and cortical atrophy. Periventricular white matter changes are consistent with small vessel disease. Small, chronic lacunar infarct is identified within the left temporal lobe.  Bone windows are unremarkable.  IMPRESSION: 1. Right frontal subarachnoid hemorrhage. 2. Critical Value/emergent results were called by telephone at the time of interpretation on 11/19/2012 at 4:15 PM to Dr.Ihsan Nomura , who verbally acknowledged these results.   Electronically Signed   By: Rosalie Gums M.D.   On: 11/19/2012 16:17    EKG Interpretation     Ventricular Rate:  79 PR Interval:  172 QRS Duration: 94 QT Interval:  392 QTC Calculation: 449 R Axis:   26 Text Interpretation:  Sinus rhythm with marked sinus arrhythmia Low voltage QRS Borderline ECG When compared with ECG of 25-Jan-2008 14:19, No significant change was found No significant change was found            MDM   1. SAH (subarachnoid hemorrhage)   2. TIA (transient ischemic attack)   3. Bilateral lower leg cellulitis    3 episodes of left-sided body numbness with ectasia lasting a half hour at a time. Back to baseline in between. Last episode 30 minutes ago. Now back to baseline. No aphasia, weakness, numbness or tingling.  Code stroke not called his symptoms have resolved. Recent admission for bilateral lower extremity cellulitis, on levaquin for pseudomonas.  Call by radiology regarding area of subarachnoid hemorrhage on CT.  Patient remains awake and alert he denies headache. This is in the same location as subarachnoid 2010 which was of unknown etiology. Discussed with Dr. Lovell Sheehan who feels this does not represent aneurysmal bleed. He recommends TIA and stroke workup by neurology.  Discussed with Dr. Roseanne Reno who accepts patient to [facility] neuro ICU. Blood pressure remains below 160 systolic in acceptable range and has actually been on the lower side with systolics in 90-110 range.  She states her BP is normally in the 90s.  No BP treatment given. She remains awake and alert denies headache.   CRITICAL CARE Performed by: Glynn Octave Total critical care time: 30 Critical care time was exclusive of separately billable procedures and treating other patients. Critical care was necessary to treat or prevent imminent or life-threatening deterioration. Critical care was  time spent personally by me on the following activities: development of treatment plan with patient and/or surrogate as well as nursing, discussions with consultants, evaluation of patient's response to treatment, examination of patient, obtaining history from patient or surrogate, ordering and performing treatments and interventions, ordering and review of laboratory studies, ordering and review of radiographic studies, pulse oximetry and re-evaluation of patient's condition.  I personally performed the services described in this documentation, which was scribed in my presence. The recorded information has been reviewed and is accurate.    Glynn Octave, MD 11/19/12 2155

## 2012-11-19 NOTE — H&P (Signed)
Neurology H&P  CC: Transient episodes of left-sided paresthesia  History is obtained from: Patient  HPI: Bridget Ball is a 79 y.o. female with a history of previous sulcal subarachnoid hemorrhage who presents with transient episodes of left-sided paresthesia as well as difficulty speaking. She was recently admitted to the hospital, but denies any recent head trauma. She did have a fall recently, but adamantly denies that she bumped her head.   She describes the episodes as starting in her arm and then progressing to the face, lasting several minutes. She has an inability to form words during this, but can understand what is said. She describes the feeling as pins and needles. She has not had a loss of consciousness with these episodes.  Of note, she was recently hospitalized with nonhealing wounds. A wound culture tested positive for Pseudomonas, and she was treated with IV antibiotics in the hospital, transitioned to by mouth Levaquin at discharge. She took her dose of Levaquin earlier today.   ROS: A 14 point ROS was performed and is negative except as noted in the HPI.  Past Medical History  Diagnosis Date  . COPD (chronic obstructive pulmonary disease)   . Arthritis   . Stroke     TIA four years ago    Family History: Mother-stroke at 20  Social History: Tob: Remote She drinks 1-2 glasses of wine per night, recently hospitalized at [facility] with no signs of withdrawal.  Exam: Current vital signs: BP 103/63  Pulse 81  Temp(Src) 98.1 F (36.7 C) (Oral)  Resp 27  SpO2 93% Vital signs in last 24 hours: Temp:  [98.1 F (36.7 C)] 98.1 F (36.7 C) (11/15 1915) Pulse Rate:  [81-87] 81 (11/15 1900) Resp:  [12-27] 27 (11/15 1953) BP: (99-137)/(54-74) 103/63 mmHg (11/15 1953) SpO2:  [92 %-98 %] 93 % (11/15 1953)  General: In bed, NAD CV: Regular rate and rhythm Abdomen: Nondistended, nontender Respiratory: Unlabored breathing Extremities: Bilateral bandaged wounds on  lower extremities. Mental Status: Patient is awake, alert, oriented to person, place, month, year, and situation. Immediate and remote memory are intact. Patient is able to give a clear and coherent history. No signs of aphasia or neglect She is able to spell world backwards and perform serial sevens getting all 5 Cranial Nerves: II: Visual Fields are full. Pupils are equal, round, and reactive to light.  Discs are difficult to visualize. III,IV, VI: EOMI without ptosis or diploplia.  V: Facial sensation is symmetric to temperature VII: Facial movement is symmetric.  VIII: hearing is intact to voice X: Uvula elevates symmetrically XI: Shoulder shrug is symmetric. XII: tongue is midline without atrophy or fasciculations.  Motor: Tone is normal. Bulk is normal. 5/5 strength was present in all four extremities.  Sensory: Sensation is symmetric to light touch and temperature in the arms and legs. Deep Tendon Reflexes: 2+ and symmetric in the biceps and patellae.  Cerebellar: FNF intact bilaterally Gait: Not tested due to multiple monitors in the ICU setting.  I have reviewed labs in epic and the results pertinent to this consultation are: CMP and CBC are both unremarkable  I have reviewed the images obtained: CT head-right-sided sulcal subarachnoid hemorrhage restricted to a single sulcus  Impression: 79 year old female with sulcal SAH of unclear etiology. She has a history of contralateral SAH restricted to a single sulcus, and had bleeding in a similar area as her current bleed at that time. I doubt that this is aneurysmal in nature, but we'll obtain a  CT angiogram of the head. One etiology of multiple nontraumatic sulcal subarachnoid hemorrhages would be amyloid angiopathy, though she has no history of memory problems.  I suspect that her current episodes are recurrent seizures given that the blood is in the area that could give the symptoms.  She is currently on Levaquin due to  Pseudomonas. She is allergic to penicillins, and apparently had a bad reaction in the past. She is not sure if she has ever had cephalosporins or other derivatives, so this time I am not sure that changing her to one of these agents would be prudent. It may be worthwhile discussing with ID, given the propensity of fluoroquinolones to reduce seizure threshold.  Recommendations: 1) CT angiogram of the head 2) MRI brain to assess for cerebral amyloid angiopathy 3) would consider curb siding ID for discussion of alternative agents in the morning 4) Keppra 500 mg twice a day following a 1 g load 5) EEG 6) likely could move to a floor bed tomorrow if stable 7) wound care nurse consultation for lower extremity wounds 8) continue thiamine     Ritta Slot, MD [facility] [phone]  If 7pm- 7am, please page neurology on call at [phone].

## 2012-11-19 NOTE — ED Notes (Addendum)
Spoke with Nehemiah Settle, RN [facility]. States [facility] will be "at least" and hour before a truck could come pick patient up. Family made aware.

## 2012-11-19 NOTE — ED Notes (Signed)
Report given to [place], RN unit 3100 at [facility]. Awaiting [facility] for transport.

## 2012-11-19 NOTE — ED Notes (Signed)
Patient reports "losing balance" 2 days ago and "sliding down the wall". Unknown if hit head or LOC. Patient with intermittent slurred speech, difficulty with certain words.

## 2012-11-19 NOTE — ED Notes (Signed)
Assisted patient with bedpan. Urine specimen obtained and sent to lab.

## 2012-11-20 ENCOUNTER — Inpatient Hospital Stay (HOSPITAL_COMMUNITY): Payer: Medicare Other

## 2012-11-20 LAB — MRSA PCR SCREENING: MRSA by PCR: NEGATIVE

## 2012-11-20 MED ORDER — OXYCODONE-ACETAMINOPHEN 5-325 MG PO TABS
1.0000 | ORAL_TABLET | Freq: Three times a day (TID) | ORAL | Status: DC | PRN
  Administered 2012-11-20 – 2012-11-21 (×3): 1 via ORAL
  Filled 2012-11-20 (×5): qty 1

## 2012-11-20 MED ORDER — LEVOFLOXACIN 500 MG PO TABS: 500.0000 mg | ORAL_TABLET | Freq: Every day | ORAL | Status: DC

## 2012-11-20 MED ORDER — ALPRAZOLAM 0.25 MG PO TABS
0.2500 mg | ORAL_TABLET | Freq: Once | ORAL | Status: AC
  Administered 2012-11-20: 0.25 mg via ORAL
  Filled 2012-11-20: qty 1

## 2012-11-20 MED ORDER — LEVETIRACETAM 500 MG PO TABS
1000.0000 mg | ORAL_TABLET | Freq: Two times a day (BID) | ORAL | Status: DC
  Administered 2012-11-20 – 2012-11-22 (×4): 1000 mg via ORAL
  Filled 2012-11-20 (×5): qty 2

## 2012-11-20 MED ORDER — OXYCODONE-ACETAMINOPHEN 5-325 MG PO TABS
1.0000 | ORAL_TABLET | Freq: Once | ORAL | Status: AC
  Administered 2012-11-20: 1 via ORAL

## 2012-11-20 MED ORDER — LEVETIRACETAM 500 MG PO TABS
500.0000 mg | ORAL_TABLET | Freq: Once | ORAL | Status: AC
  Administered 2012-11-20: 500 mg via ORAL
  Filled 2012-11-20: qty 1

## 2012-11-20 MED ORDER — LEVOFLOXACIN 750 MG PO TABS
750.0000 mg | ORAL_TABLET | ORAL | Status: DC
  Administered 2012-11-20 – 2012-11-22 (×2): 750 mg via ORAL
  Filled 2012-11-20 (×2): qty 1

## 2012-11-20 NOTE — Progress Notes (Signed)
Stroke Team Progress Note  HISTORY Bridget Ball is a 79 y.o. female with a history of previous sulcal subarachnoid hemorrhage who presents with transient episodes of left-sided paresthesia as well as difficulty speaking. She was recently admitted to the hospital, but denies any recent head trauma. She did have a fall recently, but adamantly denies that she bumped her head.   She describes the episodes as starting in her arm and then progressing to the face, lasting several minutes. She has an inability to form words during this, but can understand what is said. She describes the feeling as pins and needles. She has not had a loss of consciousness with these episodes.  Of note, she was recently hospitalized with nonhealing wounds. A wound culture tested positive for Pseudomonas, and she was treated with IV antibiotics in the hospital, transitioned to by mouth Levaquin at discharge. She took her dose of Levaquin earlier today.   Patient was not a TPA candidate secondary to ICH. She was admitted to the neuro ICUor further evaluation and treatment.  SUBJECTIVE   OBJECTIVE Most recent Vital Signs: Filed Vitals:   11/20/12 0400 11/20/12 0500 11/20/12 0600 11/20/12 0700  BP: 101/55 101/66 98/45 92/53   Pulse: 79 77 71 74  Temp:      TempSrc:      Resp: 14 18 18 18   Height:      Weight:      SpO2: 95% 98% 94% 92%   CBG (last 3)   Recent Labs  11/19/12 1853  GLUCAP 101*    IV Fluid Intake:     MEDICATIONS  . collagenase   Topical Daily  . levETIRAcetam  500 mg Oral BID  . thiamine  100 mg Oral Daily   PRN:  acetaminophen, acetaminophen, labetalol, senna-docusate  Diet:  General  Activity:  Bedrest DVT Prophylaxis:  SCD  CLINICALLY SIGNIFICANT STUDIES Basic Metabolic Panel:  Recent Labs Lab 11/14/12 0605 11/19/12 1538  NA 141 138  K 3.3* 3.9  CL 106 99  CO2 29 29  GLUCOSE 93 85  BUN 6 15  CREATININE 0.52 0.68  CALCIUM 9.2 9.9   Liver Function Tests:  Recent  Labs Lab 11/14/12 0605 11/19/12 1538  AST 12 20  ALT 11 19  ALKPHOS 57 68  BILITOT 0.2* 0.2*  PROT 5.8* 7.1  ALBUMIN 2.8* 3.6   CBC:  Recent Labs Lab 11/14/12 0605 11/19/12 1538  WBC 6.7 6.6  NEUTROABS  --  4.8  HGB 11.2* 13.3  HCT 34.1* 40.7  MCV 96.3 96.2  PLT 299 376   Coagulation:  Recent Labs Lab 11/19/12 1538  LABPROT 12.9  INR 0.99   Cardiac Enzymes:  Recent Labs Lab 11/19/12 1538  TROPONINI <0.30   Urinalysis:  Recent Labs Lab 11/19/12 1653  COLORURINE YELLOW  LABSPEC 1.010  PHURINE 6.0  GLUCOSEU NEGATIVE  HGBUR NEGATIVE  BILIRUBINUR NEGATIVE  KETONESUR NEGATIVE  PROTEINUR NEGATIVE  UROBILINOGEN 0.2  NITRITE NEGATIVE  LEUKOCYTESUR NEGATIVE   Lipid Panel    Component Value Date/Time   CHOL  Value: 214        ATP III CLASSIFICATION:  <200     mg/dL   Desirable  161-096  mg/dL   Borderline High  >=045    mg/dL   High       * 04/13/8117 0545   TRIG 101 01/25/2008 0545   HDL 80 01/25/2008 0545   CHOLHDL 2.7 01/25/2008 0545   VLDL 20 01/25/2008 0545   LDLCALC  Value: 114        Total Cholesterol/HDL:CHD Risk Coronary Heart Disease Risk Table                     Men   Women  1/2 Average Risk   3.4   3.3  Average Risk       5.0   4.4  2 X Average Risk   9.6   7.1  3 X Average Risk  23.4   11.0        Use the calculated Patient Ratio above and the CHD Risk Table to determine the patient's CHD Risk.        ATP III CLASSIFICATION (LDL):  <100     mg/dL   Optimal  865-784  mg/dL   Near or Above                    Optimal  130-159  mg/dL   Borderline  696-295  mg/dL   High  >284     mg/dL   Very High* 1/32/4401 0545   HgbA1C  Lab Results  Component Value Date   HGBA1C  Value: 5.7 (NOTE)   The ADA recommends the following therapeutic goal for glycemic   control related to Hgb A1C measurement:   Goal of Therapy:   < 7.0% Hgb A1C   Reference: American Diabetes Association: Clinical Practice   Recommendations 2008, Diabetes Care,  2008, 31:(Suppl 1). 01/24/2008     Urine Drug Screen:     Component Value Date/Time   LABOPIA NONE DETECTED 11/19/2012 1653   COCAINSCRNUR NONE DETECTED 11/19/2012 1653   LABBENZ NONE DETECTED 11/19/2012 1653   AMPHETMU NONE DETECTED 11/19/2012 1653   THCU NONE DETECTED 11/19/2012 1653   LABBARB NONE DETECTED 11/19/2012 1653    Alcohol Level:  Recent Labs Lab 11/19/12 1538  ETH <11    Ct Angio Head W/cm &/or Wo Cm  11/20/2012   CLINICAL DATA:  Subarachnoid hemorrhage, left sided numbness.  EXAM: CT ANGIOGRAPHY HEAD  TECHNIQUE: Multidetector CT imaging of the head was performed using the standard protocol during bolus administration of intravenous contrast. Multiplanar CT image reconstructions including MIPs were obtained to evaluate the vascular anatomy.  CONTRAST:  50mL OMNIPAQUE IOHEXOL 350 MG/ML SOLN  COMPARISON:  CT of the head November 19, 2012 at 1601 hr  FINDINGS: Normal appearance of the included cervical and petrous internal carotid arteries. Mild calcific atherosclerosis of the cavernous internal carotid arteries bilaterally, left greater than right. Normal appearance of the supra clinoid internal carotid arteries and carotid termini. Normal appearance of the anterior and middle cerebral arteries, including more distal segments. Patent anterior communicating artery.  Codominant vertebral arteries are normal in course and caliber, fenestration in the origin of the basilar artery, normal variant. Normal appearance of the major branch vessels, normal appearance of posterior cerebral arteries.  No large vessel occlusion, aneurysm, hemodynamically significant stenosis, suspicious luminal irregularity. No definite vascular malformations.  Again noted is hyperdense subarachnoid blood within the right central sulcus.  Very faint hypoenhancement of mesial tiny cortical veins in the right parietal lobe, equivocal for thrombus, for example axial 56/78.  Review of the MIP images confirms the above findings.  IMPRESSION: Re-  demonstration of right parietal subarachnoid blood hemorrhage, without aneurysm or vasculopathy.  Mild asymmetric enhancement of the right parietal tiny medial cortical veins which could reflect thrombus or artifact related to bolus timing. Please note, tiny cortical veins are not well demonstrated on MRV  of the head.   Electronically Signed   By: Awilda Metro   On: 11/20/2012 00:49   Ct Head Wo Contrast  11/19/2012   CLINICAL DATA:  Numbness and aphasia.  EXAM: CT HEAD WITHOUT CONTRAST  TECHNIQUE: Contiguous axial images were obtained from the base of the skull through the vertex without intravenous contrast.  COMPARISON:  01/23/2008  FINDINGS: There is high density within a sulcus in the the right posterior frontal lobe, consistent with small subarachnoid hemorrhage.  There is mild central and cortical atrophy. Periventricular white matter changes are consistent with small vessel disease. Small, chronic lacunar infarct is identified within the left temporal lobe.  Bone windows are unremarkable.  IMPRESSION: 1. Right frontal subarachnoid hemorrhage. 2. Critical Value/emergent results were called by telephone at the time of interpretation on 11/19/2012 at 4:15 PM to Dr.STEPHEN RANCOUR , who verbally acknowledged these results.   Electronically Signed   By: Rosalie Gums M.D.   On: 11/19/2012 16:17    CT of the brain    MRI of the brain    MRA of the brain    2D Echocardiogram    Carotid Doppler    CXR    Therapy Recommendations pending  Physical Exam   CV: Regular rate and rhythm  Abdomen: Nondistended, nontender  Respiratory: Unlabored breathing  Extremities: Bilateral bandaged wounds on lower extremities.  Mental Status:  Patient is awake, alert, oriented to person, place, month, year, and situation.  Immediate and remote memory are intact.  Patient is able to give a clear and coherent history.  No signs of aphasia or neglect  She is able to spell world backwards and perform  serial sevens getting all 5  Cranial Nerves:  II: Visual Fields are full. Pupils are equal, round, and reactive to light.  III,IV, VI: EOMI without ptosis or diploplia.  V: Facial sensation is symmetric to temperature  VII: Facial movement is symmetric.  VIII: hearing is intact to voice  X: Uvula elevates symmetrically  XI: Shoulder shrug is symmetric.  XII: tongue is midline without atrophy or fasciculations.  Motor:  Tone is normal. Bulk is normal. 5/5 strength was present in all four extremities.  Sensory:  Sensation is symmetric to light touch and temperature in the arms and legs.  Deep Tendon Reflexes:  2+ and symmetric in the biceps and patellae.  Cerebellar:  FNF intact bilaterally  Gait:  Not tested due to multiple monitors in the ICU setting.   ASSESSMENT [name] is a 79 y.o. female presenting with transient episodes of left-sided paresthesias and word fidning difficulties, concern for simple partial seizures. Has history of poorly healing bilateral LE wounds currently on oral levaquin. CT angio showed small right sided SAH restricted to single sulcus. Work up underway.   SAH  Seizures  LE wounds  Hospital day # 1  TREATMENT/PLAN  Continue keppra 500mg  bid  Step down to neuro floor  EEG  MRI brain  Case discussed with ID (Dr. Ninetta Lights), there is unfortunately not a good oral alternative to levaquin for Pseudomonas. Therefore will continue patient on current course of levaquin.   Tylenol prn for pain relief   Elspeth Cho, DO Neurology-Stroke

## 2012-11-20 NOTE — Consult Note (Signed)
WOC wound consult note Reason for Consult: Bilateral LE wounds (Chronic) Wound type: Suspect some degree of arterial and venous mixed disease compounding initially traumatic, now chronic wounds.  Patient and family state that approximately 18 months ago, patient was scratched by her dogs.  Chronic wounds ensued.  She recently sought treatment from the outpatient PT center at [facility].  Treatment per their notes and confirmed by family are wet-to-dry (moist?) dressings.  These were placed on Wednesday and Thursday of last week afteer pulsatile lavage treatments.  Patient was to return to that clinic on Monday (tomorrow). No wound care has been performed since Thursday. Pressure Ulcer POA: No Measurement:Right lateral: 6cm x 3.5cm and 100% yellow slough obscuring base.  Right medial: 4cm x 1.5cm and 100% yellow necrotic slough.  Left Medial 8cm x 5cm with 100% yellow slough base. Wound bed:As above. Drainage (amount, consistency, odor) scant amount of yellow/green dried exudate on old dressings. Periwound: clear, intact, no erythema. Dressing procedure/placement/frequency: I will change dressings to collagenase (Santyl) daily and recommend that if patient continues with outpatient PT 3 times a week, that she have a HHRN to change the dressings daily in-between those visits to continue the enzymatic debridement.  WOC nursing team will not follow, but will remain available to this patient, the nursing and medical team.  Please re-consult if needed. Thanks, Ladona Mow, MSN, RN, GNP, [place], [facility]ON-AP [phone])

## 2012-11-20 NOTE — Progress Notes (Signed)
Pt called out- states numbness L hand and difficulty with words. Numbness progressed to L arm and face. Minor facial droop noted. Pt states she has to focus to make her mouth work. MD notified. Keppra increased to 100mg  and order for additional 500mg  now. Pt informed of plan. Symptoms subsiding (~5 min).

## 2012-11-20 NOTE — Progress Notes (Signed)
Wet-to-dry dressing applied to bilateral leg wounds, wrap with Kerlix and ACE compression bandage.

## 2012-11-21 ENCOUNTER — Inpatient Hospital Stay (HOSPITAL_COMMUNITY): Payer: Medicare Other

## 2012-11-21 ENCOUNTER — Ambulatory Visit (HOSPITAL_COMMUNITY): Payer: Medicare Other | Admitting: *Deleted

## 2012-11-21 DIAGNOSIS — G459 Transient cerebral ischemic attack, unspecified: Secondary | ICD-10-CM

## 2012-11-21 LAB — LIPID PANEL
HDL: 62 mg/dL (ref >39)
LDL Cholesterol: 118 mg/dL — ABNORMAL HIGH (ref 0–99)
VLDL: 26 mg/dL (ref 0–40)

## 2012-11-21 MED ORDER — HYDROMORPHONE HCL 2 MG PO TABS
2.0000 mg | ORAL_TABLET | Freq: Four times a day (QID) | ORAL | Status: DC | PRN
  Administered 2012-11-21 – 2012-11-22 (×2): 2 mg via ORAL
  Filled 2012-11-21 (×2): qty 1

## 2012-11-21 MED ORDER — DIPHENHYDRAMINE HCL 12.5 MG/5ML PO ELIX
12.5000 mg | ORAL_SOLUTION | Freq: Once | ORAL | Status: AC
  Administered 2012-11-21: 12.5 mg via ORAL
  Filled 2012-11-21: qty 5

## 2012-11-21 MED ORDER — VALPROATE SODIUM 500 MG/5ML IV SOLN
500.0000 mg | INTRAVENOUS | Status: AC
  Administered 2012-11-21: 500 mg via INTRAVENOUS
  Filled 2012-11-21: qty 5

## 2012-11-21 MED ORDER — OXYCODONE-ACETAMINOPHEN 5-325 MG PO TABS
1.0000 | ORAL_TABLET | ORAL | Status: DC | PRN
  Administered 2012-11-21 – 2012-11-22 (×3): 1 via ORAL
  Filled 2012-11-21 (×3): qty 1

## 2012-11-21 MED ORDER — DIVALPROEX SODIUM ER 500 MG PO TB24
500.0000 mg | ORAL_TABLET | Freq: Every day | ORAL | Status: DC
  Administered 2012-11-22: 500 mg via ORAL
  Filled 2012-11-21: qty 1

## 2012-11-21 NOTE — Procedures (Signed)
ELECTROENCEPHALOGRAM REPORT   Patient: LUNA AUDIA       Room #: 1O10 EEG No. ID: [id number] Age: 79 y.o.        Sex: female Referring Physician: Pearlean Brownie Report Date:  11/21/2012        Interpreting Physician: Aline Brochure  History: JOHANY HANSMAN is an 79 y.o. female was admitted following recurrent episodes of transient left-sided paresthesias in speech difficulty. CT scan of her head showed recurrent small area of subarachnoid hemorrhage.  Indications for study:  Rule out new onset focal seizure disorder.  Technique: This is an 18 channel routine scalp EEG performed at the bedside with bipolar and monopolar montages arranged in accordance to the international 10/20 system of electrode placement.   Description: This EEG recording was performed during wakefulness and during sleep. Predominate background activity during wakefulness consisted of 9 Hz symmetrical alpha rhythm which stimulated well with eye opening. Photic stimulation was not performed. Hyperventilation was not performed. There was symmetrical slowing of background activity during sleep, as well as symmetrical vertex waves, sleep spindles and K-complexes during stage II of sleep. No epileptiform discharges were recorded. There were no areas of abnormal slowing of cerebral activity.  Interpretation: This a normal EEG recording during wakefulness and during sleep. No evidence of an epileptic disorder was demonstrated.   Venetia Maxon M.D. [facility] [phone]

## 2012-11-21 NOTE — Evaluation (Signed)
Physical Therapy Evaluation Patient Details Name: Bridget Ball MRN: [id number] DOB: [birth year] Today's Date: 11/21/2012 Time: 1340-1405 PT Time Calculation (min): 25 min  PT Assessment / Plan / Recommendation History of Present Illness  Patient is an 79 y/o female admitted with a history of previous sulcal subarachnoid hemorrhage who presents with transient episodes of left-sided paresthesia as well as difficulty speaking. She was recently admitted to the hospital, but denies any recent head trauma. She did have a fall recently, but adamantly denies that she bumped her head.   Clinical Impression  Patient presents with decreased independence and safety with mobility due to the deficits listed below.  Will benefit from skilled PT in the acute setting to maximize independence and safety.  Will have assist available upon d/c and already attending [facility] for wound care so feel she will benefit from getting outpatient PT at [facility] to continue to address deficits at d/c.    PT Assessment  Patient needs continued PT services    Follow Up Recommendations  Outpatient PT [facility]    Does the patient have the potential to tolerate intense rehabilitation    N/A  Barriers to Discharge  None      Equipment Recommendations  None recommended by PT    Recommendations for Other Services   None  Frequency Min 3X/week    Precautions / Restrictions Precautions Precautions: Fall Precaution Comments: reports only one recent fall in past 6 months without injury   Pertinent Vitals/Pain Min c/o pain in LE's at wounds      Mobility  Bed Mobility Bed Mobility: Supine to Sit;Sit to Supine Supine to Sit: 6: Modified independent (Device/Increase time);4: Min assist;With rails Sit to Supine: 6: Modified independent (Device/Increase time);HOB elevated;With rail Transfers Transfers: Sit to Stand;Stand to Sit Sit to Stand: 5: Supervision;From bed;From chair/3-in-1 Stand to Sit: 5:  Supervision;To chair/3-in-1;To bed Details for Transfer Assistance: Patient needed cues for safety with hand placement for transfers Ambulation/Gait Ambulation/Gait Assistance: 5: Supervision Ambulation Distance (Feet): 250 Feet Assistive device: Rolling walker Ambulation/Gait Assistance Details: cues for proximity to walker throughout session Gait Pattern: Step-through pattern;Decreased stride length Modified Rankin (Stroke Patients Only) Pre-Morbid Rankin Score: No significant disability Modified Rankin: Moderately severe disability        PT Diagnosis: Generalized weakness;Abnormality of gait  PT Problem List: Decreased strength;Decreased activity tolerance;Decreased balance;Decreased mobility;Decreased safety awareness;Decreased knowledge of use of DME PT Treatment Interventions: DME instruction;Balance training;Gait training;Stair training;Functional mobility training;Patient/family education;Therapeutic activities;Therapeutic exercise     PT Goals(Current goals can be found in the care plan section) Acute Rehab PT Goals Patient Stated Goal: TO get stronger PT Goal Formulation: With patient/family Time For Goal Achievement: 12/05/12 Potential to Achieve Goals: Good  Visit Information  Last PT Received On: 11/21/12 Assistance Needed: +1 History of Present Illness: Patient is an 79 y/o female admitted with a history of previous sulcal subarachnoid hemorrhage who presents with transient episodes of left-sided paresthesia as well as difficulty speaking. She was recently admitted to the hospital, but denies any recent head trauma. She did have a fall recently, but adamantly denies that she bumped her head.        Prior Functioning  Home Living Family/patient expects to be discharged to:: Private residence Living Arrangements: Children Available Help at Discharge: Available PRN/intermittently;Family Type of Home: House Home Access: Stairs to enter [employer] of  Steps: 4 Entrance Stairs-Rails: None Home Layout: Laundry or work area in basement;Able to live on main level with bedroom/bathroom Home  Equipment: Cane - single point;Walker - 2 wheels Prior Function Level of Independence: Independent Communication Communication: No difficulties Dominant Hand: Right    Cognition  Cognition Arousal/Alertness: Awake/alert Behavior During Therapy: WFL for tasks assessed/performed Overall Cognitive Status: Within Functional Limits for tasks assessed    Extremity/Trunk Assessment Lower Extremity Assessment Lower Extremity Assessment: RLE deficits/detail;LLE deficits/detail RLE Deficits / Details: Overall AROM and strength grossly WFL; has limited ankle DF due to pain in legs from wounds and potentially decreased ankle strength LLE Deficits / Details: Overall AROM and strength grossly WFL; has limited ankle DF due to pain in legs from wounds and potentially decreased ankle strength   Balance Balance Balance Assessed: Yes Dynamic Standing Balance Dynamic Standing - Balance Support: Bilateral upper extremity supported Dynamic Standing - Level of Assistance: 5: Stand by assistance Dynamic Standing - Comments: gait in room and reaching for bed  End of Session PT - End of Session Equipment Utilized During Treatment: Gait belt Activity Tolerance: Patient tolerated treatment well Patient left: in chair;with call bell/phone within reach;with family/visitor present  GP     [facility] 11/21/2012, 2:50 PM [place], PT [phone] 11/21/2012

## 2012-11-21 NOTE — Progress Notes (Signed)
Routine adult EEG completed. 

## 2012-11-21 NOTE — Progress Notes (Signed)
Stroke Team Progress Note  HISTORY Bridget Ball is a 79 y.o. female with a history of previous sulcal subarachnoid hemorrhage who presents with transient episodes of left-sided paresthesia as well as difficulty speaking. She was recently admitted to the hospital, but denies any recent head trauma. She did have a fall recently, but adamantly denies that she bumped her head.   She describes the episodes as starting in her arm and then progressing to the face, lasting several minutes. She has an inability to form words during this, but can understand what is said. She describes the feeling as pins and needles. She has not had a loss of consciousness with these episodes.  Of note, she was recently hospitalized with nonhealing wounds. A wound culture tested positive for Pseudomonas, and she was treated with IV antibiotics in the hospital, transitioned to by mouth Levaquin at discharge.   Patient was not a TPA candidate secondary to ICH. She was admitted to the neuro ICUor further evaluation and treatment.  SUBJECTIVE Family at the bedside. Dr. Pearlean Brownie discussed diagnosis, prognosis,  treatment options and plan of care with them.  OBJECTIVE Most recent Vital Signs: Filed Vitals:   11/20/12 1200 11/20/12 1300 11/20/12 2216 11/21/12 0534  BP: 91/57  104/62 91/54  Pulse: 76 83 73 59  Temp:   97.4 F (36.3 C) 98 F (36.7 C)  TempSrc:   Oral Oral  Resp: 15 21 20 16   Height:      Weight:      SpO2: 97% 95% 97% 100%   CBG (last 3)   Recent Labs  11/19/12 1853  GLUCAP 101*    IV Fluid Intake:     MEDICATIONS  . collagenase   Topical Daily  . levETIRAcetam  1,000 mg Oral BID  . levofloxacin  750 mg Oral Q48H  . thiamine  100 mg Oral Daily   PRN:  acetaminophen, acetaminophen, oxyCODONE-acetaminophen, senna-docusate  Diet:  General thin liquids Activity:  As tolerated, up with assistance DVT Prophylaxis:  SCDs  CLINICALLY SIGNIFICANT STUDIES Basic Metabolic Panel:   Recent  Labs Lab 11/19/12 1538  NA 138  K 3.9  CL 99  CO2 29  GLUCOSE 85  BUN 15  CREATININE 0.68  CALCIUM 9.9   Liver Function Tests:   Recent Labs Lab 11/19/12 1538  AST 20  ALT 19  ALKPHOS 68  BILITOT 0.2*  PROT 7.1  ALBUMIN 3.6   CBC:   Recent Labs Lab 11/19/12 1538  WBC 6.6  NEUTROABS 4.8  HGB 13.3  HCT 40.7  MCV 96.2  PLT 376   Coagulation:   Recent Labs Lab 11/19/12 1538  LABPROT 12.9  INR 0.99   Cardiac Enzymes:   Recent Labs Lab 11/19/12 1538  TROPONINI <0.30   Urinalysis:   Recent Labs Lab 11/19/12 1653  COLORURINE YELLOW  LABSPEC 1.010  PHURINE 6.0  GLUCOSEU NEGATIVE  HGBUR NEGATIVE  BILIRUBINUR NEGATIVE  KETONESUR NEGATIVE  PROTEINUR NEGATIVE  UROBILINOGEN 0.2  NITRITE NEGATIVE  LEUKOCYTESUR NEGATIVE   Lipid Panel    Component Value Date/Time   CHOL  Value: 214        ATP III CLASSIFICATION:  <200     mg/dL   Desirable  010-272  mg/dL   Borderline High  >=536    mg/dL   High       * 6/44/0347 0545   TRIG 101 01/25/2008 0545   HDL 80 01/25/2008 0545   CHOLHDL 2.7 01/25/2008 0545   VLDL 20  01/25/2008 0545   LDLCALC  Value: 114        Total Cholesterol/HDL:CHD Risk Coronary Heart Disease Risk Table                     Men   Women  1/2 Average Risk   3.4   3.3  Average Risk       5.0   4.4  2 X Average Risk   9.6   7.1  3 X Average Risk  23.4   11.0        Use the calculated Patient Ratio above and the CHD Risk Table to determine the patient's CHD Risk.        ATP III CLASSIFICATION (LDL):  <100     mg/dL   Optimal  960-454  mg/dL   Near or Above                    Optimal  130-159  mg/dL   Borderline  098-119  mg/dL   High  >147     mg/dL   Very High* 09/03/5619 0545   HgbA1C  Lab Results  Component Value Date   HGBA1C  Value: 5.7 (NOTE)   The ADA recommends the following therapeutic goal for glycemic   control related to Hgb A1C measurement:   Goal of Therapy:   < 7.0% Hgb A1C   Reference: American Diabetes Association: Clinical Practice    Recommendations 2008, Diabetes Care,  2008, 31:(Suppl 1). 01/24/2008    Urine Drug Screen:     Component Value Date/Time   LABOPIA NONE DETECTED 11/19/2012 1653   COCAINSCRNUR NONE DETECTED 11/19/2012 1653   LABBENZ NONE DETECTED 11/19/2012 1653   AMPHETMU NONE DETECTED 11/19/2012 1653   THCU NONE DETECTED 11/19/2012 1653   LABBARB NONE DETECTED 11/19/2012 1653    Alcohol Level:   Recent Labs Lab 11/19/12 1538  ETH <11    CT of the brain  11/19/2012  Right frontal subarachnoid hemorrhage.   CT Angio Head 11/20/2012  Re- demonstration of right parietal subarachnoid blood hemorrhage, without aneurysm or vasculopathy.  Mild asymmetric enhancement of the right parietal tiny medial cortical veins which could reflect thrombus or artifact related to bolus timing.   MRI of the brain  11/20/2012    a few tiny infarctions scattered along the gyral surfaces in the right middle cerebral artery region, right parietal and frontoparietal region. Small amount of subarachnoid blood in the right frontal parietal sulcus and the adjacent subarachnoid spaces over the convexity. Same area that was involved in 2010.  Usually, subarachnoid hemorrhage is not caused by micro embolic cortical arterial infarctions. I think it is possible that there could be localized superficial venous thrombosis in this region. Certainly, the major sinuses are widely patent.  I think an additional consideration could be the possibility of a low level venous anomaly affecting the surface of the brain at the frontoparietal vertex where the hemorrhages demonstrated repeatedly.  Chronic small vessel disease elsewhere throughout the cerebral hemispheric white matter.   EEG  pending  Therapy Recommendations pending  Physical Exam   CV: Regular rate and rhythm  Abdomen: Nondistended, nontender  Respiratory: Unlabored breathing  Extremities: Bilateral bandaged wounds on lower extremities.  Mental Status:  Patient is awake, alert,  oriented to person, place, month, year, and situation.  Immediate and remote memory are intact.  Patient is able to give a clear and coherent history.  No signs of aphasia or neglect  She  is able to spell world backwards and perform serial sevens getting all 5  Cranial Nerves:  II: Visual Fields are full. Pupils are equal, round, and reactive to light.  III,IV, VI: EOMI without ptosis or diploplia.  V: Facial sensation is symmetric to temperature  VII: Facial movement is symmetric.  VIII: hearing is intact to voice  X: Uvula elevates symmetrically  XI: Shoulder shrug is symmetric.  XII: tongue is midline without atrophy or fasciculations.  Motor:  Tone is normal. Bulk is normal. 5/5 strength was present in all four extremities.  Sensory:  Sensation is symmetric to light touch and temperature in the arms and legs.  Deep Tendon Reflexes:  2+ and symmetric in the biceps and patellae.  Cerebellar:  FNF intact bilaterally  Gait:  Not tested due to multiple monitors in the ICU setting.   ASSESSMENT Bridget Ball is a 79 y.o. female presenting with transient episodes of left-sided paresthesias and word finding difficulties,  result of simple partial seizures due to irritation of the brain from small right sided SAH  - that is in the same  Area with the same symptoms as SAH in 2010. Etiology of his small localized subarachnoid hemorrhage likely to be small abnormal venous vessel not visualized on previous cerebral catheter angiogram in 2010.   Hx SAH - sulcal subarachnoid hemorrhage Jan 2010. Angio unrevealing at that time. Could not tolerate neurontin. Placed on another med. Followed up with Pearlean Brownie in the office 1 time. Over time, stopped antiepileptics and neuro followup.  Seizures, on Keppra 1000 mg bid  LE wounds, pseudomonas, on levaquin, followed by wound clinic  Hospital day # 2  TREATMENT/PLAN  Continue keppra 1000mg  bid.  Give depakote 500 mg IV now followed by 500 mg  ER po daily  Keep and monitor today  Anticipate discharge tomorrow  Long discussion with patient and multiple family members and answered questions  Annie Main, MSN, RN, ANVP-BC, ANP-BC, GNP-BC [facility] Pager: [phone] 11/21/2012 10:05 AM  I have personally obtained a history, examined the patient, evaluated imaging results, and formulated the assessment and plan of care. I agree with the above. Delia Heady, MD

## 2012-11-22 ENCOUNTER — Telehealth (HOSPITAL_COMMUNITY): Payer: Self-pay

## 2012-11-22 ENCOUNTER — Ambulatory Visit (HOSPITAL_COMMUNITY): Payer: Medicare Other | Admitting: Physical Therapy

## 2012-11-22 DIAGNOSIS — I609 Nontraumatic subarachnoid hemorrhage, unspecified: Secondary | ICD-10-CM | POA: Diagnosis present

## 2012-11-22 DIAGNOSIS — R569 Unspecified convulsions: Secondary | ICD-10-CM | POA: Diagnosis present

## 2012-11-22 MED ORDER — DIVALPROEX SODIUM ER 500 MG PO TB24: 500.0000 mg | ORAL_TABLET | Freq: Every day | ORAL | Status: DC

## 2012-11-22 MED ORDER — HYDROMORPHONE HCL 2 MG PO TABS: 2.0000 mg | ORAL_TABLET | Freq: Four times a day (QID) | ORAL | Status: DC | PRN

## 2012-11-22 MED ORDER — LEVETIRACETAM 1000 MG PO TABS: 1000.0000 mg | ORAL_TABLET | Freq: Two times a day (BID) | ORAL | Status: DC

## 2012-11-22 NOTE — Progress Notes (Signed)
Physical Therapy Treatment Patient Details Name: Bridget Ball MRN: [id number] DOB: 12/20/1928 Today's Date: 11/22/2012 Time: 7829-5621 PT Time Calculation (min): 27 min  PT Assessment / Plan / Recommendation  History of Present Illness Patient is an 79 y/o female admitted with a history of previous sulcal subarachnoid hemorrhage who presents with transient episodes of left-sided paresthesia as well as difficulty speaking. She was recently admitted to the hospital, but denies any recent head trauma. She did have a fall recently, but adamantly denies that she bumped her head.    PT Comments   Pt limited by pain today. Pt progressing with stair training however after 1 stair pt declined further training to return to room.  Recommended to attempt 4 stairs, as pt has to enter home, prior to d/c.  Pt's family will not be present 24/7 however has family always available with a daughter next door.  Pt will benefit from further PT through Outpatient  to increase functional independence at home and in the community.  Follow Up Recommendations  Outpatient PT     Does the patient have the potential to tolerate intense rehabilitation     Barriers to Discharge        Equipment Recommendations  None recommended by PT    Recommendations for Other Services    Frequency Min 3X/week   Progress towards PT Goals Progress towards PT goals: Progressing toward goals  Plan Current plan remains appropriate    Precautions / Restrictions Precautions Precautions: Fall Precaution Comments: reports only one recent fall in past 6 months without injury Restrictions Weight Bearing Restrictions: No   Pertinent Vitals/Pain 9/10 BLE; nursing notified for meds    Mobility  Bed Mobility Bed Mobility: Rolling Right;Right Sidelying to Sit Rolling Right: 6: Modified independent (Device/Increase time) Right Sidelying to Sit: 6: Modified independent (Device/Increase time);HOB elevated Details for Bed Mobility  Assistance: Pt has HOB elevated at home Transfers Transfers: Sit to Stand;Stand to Sit Sit to Stand: 5: Supervision;From bed Stand to Sit: To chair/3-in-1;5: Supervision Details for Transfer Assistance: vc's for hand placement  Ambulation/Gait Ambulation/Gait Assistance: 5: Supervision;4: Min guard Ambulation Distance (Feet): 150 Feet Assistive device: Rolling walker Ambulation/Gait Assistance Details: Pt has increased pain in BLE limiting ambulation/stairs today. vc's to stay closer to RW and for erect posture Gait Pattern: Step-through pattern;Decreased stride length Gait velocity: decreased Stairs: Yes Stairs Assistance: 4: Min assist Stairs Assistance Details (indicate cue type and reason): Cues for safe technique; demonstrated technique to family in room. Pain limiting pt today and stated that she was ready to go back to room after 1 stair. Will attempt 5 tomorrow  Stair Management Technique: No rails;Other (comment) (HHA) Number of Stairs: 1    Exercises     PT Diagnosis:    PT Problem List:   PT Treatment Interventions:     PT Goals (current goals can now be found in the care plan section)    Visit Information  Last PT Received On: 11/22/12 Assistance Needed: +1 History of Present Illness: Patient is an 79 y/o female admitted with a history of previous sulcal subarachnoid hemorrhage who presents with transient episodes of left-sided paresthesia as well as difficulty speaking. She was recently admitted to the hospital, but denies any recent head trauma. She did have a fall recently, but adamantly denies that she bumped her head.     Subjective Data      Cognition  Cognition Arousal/Alertness: Awake/alert Behavior During Therapy: WFL for tasks assessed/performed Overall Cognitive Status: Within Functional  Limits for tasks assessed    Balance     End of Session PT - End of Session Equipment Utilized During Treatment: Gait belt Activity Tolerance: Patient limited by  pain Patient left: in chair;with call bell/phone within reach;with family/visitor present Nurse Communication: Mobility status;Patient requests pain meds   GP     Ernestina Columbia, SPTA 11/22/2012, 8:57 AM

## 2012-11-22 NOTE — Progress Notes (Signed)
Stroke Team Progress Note  HISTORY Bridget Ball is a 79 y.o. female with a history of previous sulcal subarachnoid hemorrhage who presents with transient episodes of left-sided paresthesia as well as difficulty speaking. She was recently admitted to the hospital, but denies any recent head trauma. She did have a fall recently, but adamantly denies that she bumped her head.   She describes the episodes as starting in her left arm and then progressing to the face, lasting several minutes. She has an inability to form words during this, but can understand what is said. She describes the feeling as pins and needles. She has not had a loss of consciousness with these episodes.   Of note, she was recently hospitalized with nonhealing wounds. A wound culture tested positive for Pseudomonas, and she was treated with IV antibiotics in the hospital, transitioned to by mouth Levaquin at discharge.   Patient was not a TPA candidate secondary to hemorrhage. She was admitted to the neuro ICU for further evaluation and treatment.  SUBJECTIVE Pt lying in bed. Family, including daughter at the bedside. No reported seizure symptoms since yesterday AM.  OBJECTIVE Most recent Vital Signs: Filed Vitals:   11/21/12 2135 11/22/12 0019 11/22/12 0130 11/22/12 0959  BP: 113/66 100/60 116/66 93/61  Pulse: 70 112 80 81  Temp: 97.6 F (36.4 C) 97 F (36.1 C) 97.5 F (36.4 C) 97.8 F (36.6 C)  TempSrc: Oral Axillary Axillary Oral  Resp: 20 18 18 18   Height:      Weight:      SpO2: 96% 100% 96% 94%   CBG (last 3)   Recent Labs  11/19/12 1853  GLUCAP 101*    IV Fluid Intake:     MEDICATIONS  . collagenase   Topical Daily  . divalproex  500 mg Oral Daily  . levETIRAcetam  1,000 mg Oral BID  . levofloxacin  750 mg Oral Q48H  . thiamine  100 mg Oral Daily   PRN:  acetaminophen, acetaminophen, HYDROmorphone, oxyCODONE-acetaminophen, senna-docusate  Diet:  General thin liquids Activity:  As tolerated,  up with assistance DVT Prophylaxis:  SCDs  CLINICALLY SIGNIFICANT STUDIES Basic Metabolic Panel:   Recent Labs Lab 11/19/12 1538  NA 138  K 3.9  CL 99  CO2 29  GLUCOSE 85  BUN 15  CREATININE 0.68  CALCIUM 9.9   Liver Function Tests:   Recent Labs Lab 11/19/12 1538  AST 20  ALT 19  ALKPHOS 68  BILITOT 0.2*  PROT 7.1  ALBUMIN 3.6   CBC:   Recent Labs Lab 11/19/12 1538  WBC 6.6  NEUTROABS 4.8  HGB 13.3  HCT 40.7  MCV 96.2  PLT 376   Coagulation:   Recent Labs Lab 11/19/12 1538  LABPROT 12.9  INR 0.99   Cardiac Enzymes:   Recent Labs Lab 11/19/12 1538  TROPONINI <0.30   Urinalysis:   Recent Labs Lab 11/19/12 1653  COLORURINE YELLOW  LABSPEC 1.010  PHURINE 6.0  GLUCOSEU NEGATIVE  HGBUR NEGATIVE  BILIRUBINUR NEGATIVE  KETONESUR NEGATIVE  PROTEINUR NEGATIVE  UROBILINOGEN 0.2  NITRITE NEGATIVE  LEUKOCYTESUR NEGATIVE   Lipid Panel    Component Value Date/Time   CHOL 206* 11/21/2012 1100   TRIG 129 11/21/2012 1100   HDL 62 11/21/2012 1100   CHOLHDL 3.3 11/21/2012 1100   VLDL 26 11/21/2012 1100   LDLCALC 118* 11/21/2012 1100   HgbA1C  Lab Results  Component Value Date   HGBA1C  Value: 5.7 (NOTE)   The ADA  recommends the following therapeutic goal for glycemic   control related to Hgb A1C measurement:   Goal of Therapy:   < 7.0% Hgb A1C   Reference: American Diabetes Association: Clinical Practice   Recommendations 2008, Diabetes Care,  2008, 31:(Suppl 1). 01/24/2008    Urine Drug Screen:     Component Value Date/Time   LABOPIA NONE DETECTED 11/19/2012 1653   COCAINSCRNUR NONE DETECTED 11/19/2012 1653   LABBENZ NONE DETECTED 11/19/2012 1653   AMPHETMU NONE DETECTED 11/19/2012 1653   THCU NONE DETECTED 11/19/2012 1653   LABBARB NONE DETECTED 11/19/2012 1653    Alcohol Level:   Recent Labs Lab 11/19/12 1538  ETH <11    CT of the brain  11/19/2012  Right frontal subarachnoid hemorrhage.   CT Angio Head 11/20/2012  Re-  demonstration of right parietal subarachnoid blood hemorrhage, without aneurysm or vasculopathy.  Mild asymmetric enhancement of the right parietal tiny medial cortical veins which could reflect thrombus or artifact related to bolus timing.   MRI of the brain  11/20/2012    a few tiny infarctions scattered along the gyral surfaces in the right middle cerebral artery region, right parietal and frontoparietal region. Small amount of subarachnoid blood in the right frontal parietal sulcus and the adjacent subarachnoid spaces over the convexity. Same area that was involved in 2010.  Usually, subarachnoid hemorrhage is not caused by micro embolic cortical arterial infarctions. I think it is possible that there could be localized superficial venous thrombosis in this region. Certainly, the major sinuses are widely patent.  I think an additional consideration could be the possibility of a low level venous anomaly affecting the surface of the brain at the frontoparietal vertex where the hemorrhages demonstrated repeatedly.  Chronic small vessel disease elsewhere throughout the cerebral hemispheric white matter.   EEG  11/21/2012  This a normal EEG recording during wakefulness and during sleep. No evidence of an epileptic disorder was demonstrated.  Therapy Recommendations resume OP PT at [facility]  Physical Exam   GENERAL EXAM: Patient is in no distress  CARDIOVASCULAR: Regular rate and rhythm, no murmurs, no carotid bruits  NEUROLOGIC: MENTAL STATUS: awake, alert, language fluent, comprehension intact, naming intact CRANIAL NERVE: pupils equal and reactive to light, visual fields full to confrontation, extraocular muscles intact, no nystagmus, facial sensation and strength symmetric, uvula midline, shoulder shrug symmetric, tongue midline. MOTOR: normal bulk and tone, full strength in the BUE, BLE SENSORY: normal and symmetric to light touch COORDINATION: finger-nose-finger, fine finger movements  normal REFLEXES: deep tendon reflexes present and symmetric GAIT/STATION: IN BED   ASSESSMENT [name] is a 79 y.o. female presenting with transient episodes of left-sided paresthesias and word finding difficulties,  result of simple partial seizures due to irritation of the brain from small right sided SAH  - that is in the same area with the same symptoms as SAH in 2010. Etiology of his small localized subarachnoid hemorrhage likely to be amyloid angiopathy. "Spells" worsen during periods of anxiety. Frequency of these "spells" has decreased - none today.   Hx SAH - convexity subarachnoid hemorrhage Jan 2010. Angio unrevealing at that time. Could not tolerate neurontin. Placed on another med. Followed up with Pearlean Brownie in the office 1 time. Over time, stopped antiepileptics and neuro followup.  Seizures, on Keppra 1000 mg bid  LE wounds, pseudomonas, on levaquin, followed by wound clinic  Hospital day # 3   TREATMENT/PLAN  Continue keppra 1000mg  bid and depakote 500 ER po daily  Resume  OP PT at [facility] per PT recs  Medically ready for d/c later today or in am, pending discharge disposition  Family concerned about pt returning home - may need to consider options - will ask SW discuss short-term SNF vs AL vs increased home care. Also discussed with CM.  Annie Main, MSN, RN, ANVP-BC, ANP-BC, [facility] Pager: [phone] 11/22/2012 10:51 AM  I evaluated and examined patient, reviewed records, labs and imaging, and agree with note and plan.  Suanne Marker, MD 11/22/2012, 3:55 PM Certified in Neurology, Neurophysiology and Neuroimaging [facility] - Stroke Team  Please refer to [email or web address] for on-call Stroke MD

## 2012-11-22 NOTE — Progress Notes (Signed)
Patient is discharged from room 4N05 at this time. Alert and in stable condition. IV site d/c'd. Instructions read to both patient son and understanding verbalized. Left unit via wheelchair.

## 2012-11-22 NOTE — Progress Notes (Signed)
Seen and agreed 11/22/2012 Fredrich Birks PTA [phone] pager [phone] office

## 2012-11-22 NOTE — Discharge Summary (Signed)
Stroke Discharge Summary  Patient ID: Bridget Ball   MRN: [id number]      DOB: 06-14-1928  Date of Admission: 11/19/2012 Date of Discharge: 11/22/2012  Attending Physician:  Darcella Cheshire, MD, Stroke MD  Consulting Physician(s):   Treatment Team:  Md Stroke, MD   Patient's PCP:  Isabella Stalling, MD  Discharge Diagnoses:  Principal Problem:   SAH (subarachnoid hemorrhage) Active Problems:   Bilateral lower leg cellulitis   Open wound of leg   Seizures secondary to [facility]  BMI: Body mass index is 22.05 kg/(m^2).  Past Medical History  Diagnosis Date  . COPD (chronic obstructive pulmonary disease)   . Arthritis   . Stroke     TIA four years ago   Past Surgical History  Procedure Laterality Date  . Appendectomy    . Skin cancer excision    . Tubal ligation        Medication List    STOP taking these medications       ALEVE 220 MG tablet  Generic drug:  naproxen sodium     gabapentin 300 MG capsule  Commonly known as:  NEURONTIN      TAKE these medications       collagenase ointment  Commonly known as:  SANTYL  Apply topically daily.     divalproex 500 MG 24 hr tablet  Commonly known as:  DEPAKOTE ER  Take 1 tablet (500 mg total) by mouth daily.     HYDROmorphone 2 MG tablet  Commonly known as:  DILAUDID  Take 1 tablet (2 mg total) by mouth every 6 (six) hours as needed for severe pain.     levETIRAcetam 1000 MG tablet  Commonly known as:  KEPPRA  Take 1 tablet (1,000 mg total) by mouth 2 (two) times daily.     levofloxacin 500 MG tablet  Commonly known as:  LEVAQUIN  Take 500 mg by mouth daily. 14 DAY COURSE STARTING ON 11/16/2012     LORazepam 0.5 MG tablet  Commonly known as:  ATIVAN  Take 1 tablet (0.5 mg total) by mouth 3 (three) times daily as needed for anxiety.     thiamine 100 MG tablet  Take 1 tablet (100 mg total) by mouth daily.        LABORATORY STUDIES CBC    Component Value Date/Time   WBC 6.6 11/19/2012 1538    RBC 4.23 11/19/2012 1538   HGB 13.3 11/19/2012 1538   HCT 40.7 11/19/2012 1538   PLT 376 11/19/2012 1538   MCV 96.2 11/19/2012 1538   MCH 31.4 11/19/2012 1538   MCHC 32.7 11/19/2012 1538   RDW 14.0 11/19/2012 1538   LYMPHSABS 1.0 11/19/2012 1538   MONOABS 0.7 11/19/2012 1538   EOSABS 0.2 11/19/2012 1538   BASOSABS 0.0 11/19/2012 1538   CMP    Component Value Date/Time   NA 138 11/19/2012 1538   K 3.9 11/19/2012 1538   CL 99 11/19/2012 1538   CO2 29 11/19/2012 1538   GLUCOSE 85 11/19/2012 1538   BUN 15 11/19/2012 1538   CREATININE 0.68 11/19/2012 1538   CALCIUM 9.9 11/19/2012 1538   PROT 7.1 11/19/2012 1538   ALBUMIN 3.6 11/19/2012 1538   AST 20 11/19/2012 1538   ALT 19 11/19/2012 1538   ALKPHOS 68 11/19/2012 1538   BILITOT 0.2* 11/19/2012 1538   GFRNONAA 79* 11/19/2012 1538   GFRAA >90 11/19/2012 1538   COAGS Lab Results  Component Value Date  INR 0.99 11/19/2012   INR 0.9 01/24/2008   Lipid Panel    Component Value Date/Time   CHOL 206* 11/21/2012 1100   TRIG 129 11/21/2012 1100   HDL 62 11/21/2012 1100   CHOLHDL 3.3 11/21/2012 1100   VLDL 26 11/21/2012 1100   LDLCALC 118* 11/21/2012 1100   HgbA1C  Lab Results  Component Value Date   HGBA1C  Value: 5.7 (NOTE)   The ADA recommends the following therapeutic goal for glycemic   control related to Hgb A1C measurement:   Goal of Therapy:   < 7.0% Hgb A1C   Reference: American Diabetes Association: Clinical Practice   Recommendations 2008, Diabetes Care,  2008, 31:(Suppl 1). 01/24/2008   Cardiac Panel (last 3 results) No results found for this basename: CKTOTAL, CKMB, TROPONINI, RELINDX,  in the last 72 hours Urinalysis    Component Value Date/Time   COLORURINE YELLOW 11/19/2012 1653   APPEARANCEUR CLEAR 11/19/2012 1653   LABSPEC 1.010 11/19/2012 1653   PHURINE 6.0 11/19/2012 1653   GLUCOSEU NEGATIVE 11/19/2012 1653   HGBUR NEGATIVE 11/19/2012 1653   BILIRUBINUR NEGATIVE 11/19/2012 1653   KETONESUR NEGATIVE  11/19/2012 1653   PROTEINUR NEGATIVE 11/19/2012 1653   UROBILINOGEN 0.2 11/19/2012 1653   NITRITE NEGATIVE 11/19/2012 1653   LEUKOCYTESUR NEGATIVE 11/19/2012 1653   Urine Drug Screen     Component Value Date/Time   LABOPIA NONE DETECTED 11/19/2012 1653   COCAINSCRNUR NONE DETECTED 11/19/2012 1653   LABBENZ NONE DETECTED 11/19/2012 1653   AMPHETMU NONE DETECTED 11/19/2012 1653   THCU NONE DETECTED 11/19/2012 1653   LABBARB NONE DETECTED 11/19/2012 1653    Alcohol Level    Component Value Date/Time   ETH <11 11/19/2012 1538    SIGNIFICANT DIAGNOSTIC STUDIES CT of the brain 11/19/2012 Right frontal subarachnoid hemorrhage.  CT Angio Head 11/20/2012 Re- demonstration of right parietal subarachnoid blood hemorrhage, without aneurysm or vasculopathy. Mild asymmetric enhancement of the right parietal tiny medial cortical veins which could reflect thrombus or artifact related to bolus timing.  MRI of the brain 11/20/2012 a few tiny infarctions scattered along the gyral surfaces in the right middle cerebral artery region, right parietal and frontoparietal region. Small amount of subarachnoid blood in the right frontal parietal sulcus and the adjacent subarachnoid spaces over the convexity. Same area that was involved in 2010. Usually, subarachnoid hemorrhage is not caused by micro embolic cortical arterial infarctions. I think it is possible that there could be localized superficial venous thrombosis in this region. Certainly, the major sinuses are widely patent. I think an additional consideration could be the possibility of a low level venous anomaly affecting the surface of the brain at the frontoparietal vertex where the hemorrhages demonstrated repeatedly. Chronic small vessel disease elsewhere throughout the cerebral hemispheric white matter.  EEG 11/21/2012 This a normal EEG recording during wakefulness and during sleep. No evidence of an epileptic disorder was demonstrated.      History  of Present Illness   Bridget Ball is a 79 y.o. female with a history of previous sulcal subarachnoid hemorrhage (convexity subarachnoid hemorrhage Jan 2010. Angio unrevealing at that time. Could not tolerate neurontin. Placed on another med. Followed up with Pearlean Brownie in the office 1 time. Over time, stopped antiepileptics and neuro follow-up) who presents with transient episodes of left-sided paresthesia as well as difficulty speaking.  She describes the episodes as starting in her left arm and then progressing to the face, lasting several minutes. She has an inability to form words during  this, but can understand what is said. She describes the feeling as pins and needles. She has not had a loss of consciousness with these episodes. These episodes are identical to the symptoms she had in 2010 with her first [facility]. She denied any recent head trauma. She did have a fall recently, but adamantly denies that she bumped her head.    She was recently admitted to the hospital 11/14/2012 with nonhealing wounds. A wound culture tested positive for Pseudomonas, and she was treated with IV antibiotics in the hospital, transitioned to by mouth Levaquin at discharge.   Patient was not a TPA candidate secondary to hemorrhage. She was admitted to the neuro ICU for further evaluation and treatment.  Hospital Course She was found to have simple partial seizures due to irritation of the brain from the small right sided SAH. Etiology of this small localized subarachnoid hemorrhage may be amyloid angiopathy. "Spells"/seizures worsen during periods of anxiety. Initially she was placed on Keppra 500 mg bid, then increased to 1000 mg bid and Depakote ER was added. Frequency of these "spells" has decreased during her hospital stay.  Patient admitted with bilateral LE wounds, pseudomonas, on levaquin, followed by wound clinic at [facility]. Wound RN evaluated here - recommended that if patient continues with outpatient PT 3 times a week,  that she have a HHRN to change the dressings daily in-between those visits to continue the enzymatic debridement. Discussed with [facility] wound center. They will continue current treatment and adjust plan there. Unable to schedule both home health and OP wound care.   Patient with continued stroke symptoms of LE weakness. Physical therapy, occupational therapy and speech therapy evaluated patient. They recommend OP PT. Have ordered.  Discharge Exam  Blood pressure 94/64, pulse 84, temperature 97.5 F (36.4 C), temperature source Oral, resp. rate 18, height 5\' 1"  (1.549 m), weight 52.9 kg (116 lb 10 oz), SpO2 97.00%.   GENERAL EXAM:  Patient is in no distress  CARDIOVASCULAR:  Regular rate and rhythm, no murmurs, no carotid bruits  NEUROLOGIC:  MENTAL STATUS: awake, alert, language fluent, comprehension intact, naming intact  CRANIAL NERVE: pupils equal and reactive to light, visual fields full to confrontation, extraocular muscles intact, no nystagmus, facial sensation and strength symmetric, uvula midline, shoulder shrug symmetric, tongue midline.  MOTOR: normal bulk and tone, full strength in the BUE, BLE  SENSORY: normal and symmetric to light touch  COORDINATION: finger-nose-finger, fine finger movements normal  REFLEXES: deep tendon reflexes present and symmetric  GAIT/STATION: IN BED   Discharge Diet   General thin liquids  Discharge Plan    Disposition:  Home with daughter   Resume outpatient wound care at [facility]  Outpatient Physical Therapy   Ongoing risk factor control by Primary Care Physician.  Follow-up DONDIEGO,RICHARD M, MD in 1 month.  Follow-up with Dr. Delia Heady, Stroke Clinic in 2 months.  40 minutes were spent preparing discharge.  Signed Annie Main, AVNP, ANP-BC, [facility] Nurse Practitioner 11/22/2012, 3:57 PM   I evaluated and examined patient, reviewed records, labs and imaging, and agree with note and plan.  Suanne Marker, MD  11/22/2012, 4:41 PM Certified in Neurology, Neurophysiology and Neuroimaging [facility] - Stroke Team  Please refer to [email or web address] for on-call Stroke MD

## 2012-11-22 NOTE — Care Management Note (Signed)
    Page 1 of 1   11/22/2012     3:38:52 PM   CARE MANAGEMENT NOTE 11/22/2012  Patient:  Bridget Ball, Bridget Ball   Account Number:  [id number]  Date Initiated:  11/22/2012  Documentation initiated by:  Elmer Bales  Subjective/Objective Assessment:   Patient admitted for [facility], lives at home with children. Patient is currently recieving wound care as an outpatient at [facility]     Action/Plan:   Will follow for discharge needs   Anticipated DC Date:  11/22/2012   Anticipated DC Plan:  HOME/SELF CARE      DC Planning Services  CM consult      Choice offered to / List presented to:             Status of service:  Completed, signed off Medicare Important Message given?   (If response is "NO", the following Medicare IM given date fields will be blank) Date Medicare IM given:   Date Additional Medicare IM given:    Discharge Disposition:  HOME/SELF CARE  Per UR Regulation:  Reviewed for med. necessity/level of care/duration of stay  If discussed at Long Length of Stay Meetings, dates discussed:    Comments:  11/22/12 1530 Elmer Bales RN, MSN, CM- Spoke with Angelica Chessman at [facility] outpatient rehab regarding PT and wound care orders. Patient has an appointment for 1515 on 11/23/12 for wound care, but does not yet have a PT order. Angelica Chessman will notify patient of appointment date and time.   11/22/12 0950 Elmer Bales RN, MSN, CM- Met with patient and family to verify status of wound care. Per patient's daughter, patient is recieving outpatient wound care at [facility].

## 2012-11-23 ENCOUNTER — Ambulatory Visit (HOSPITAL_COMMUNITY)
Admission: RE | Admit: 2012-11-23 | Discharge: 2012-11-23 | Disposition: A | Discharge status: Home / Self Care | Payer: Medicare Other | Source: Ambulatory Visit | Attending: Family Medicine | Admitting: Family Medicine

## 2012-11-23 NOTE — Progress Notes (Signed)
Physical Therapy - Wound Therapy  Evaluation   Patient Details  Name: Bridget Ball MRN: [id number] Date of Birth: January 20, 1928  Today's Date: 11/23/2012 Time: 4098-1191 Time Calculation (min): 75 min  Visit#: 2 of 18  Re-eval: 12/17/12  Subjective Subjective Assessment Subjective: Pt states she had an MRI on 11/19/2012.  She was admitted to the hospital and stayed [facility] 11/22/2012.  We now have verbal orders to begin strengthening and balance therapy as well as wound therapy  Patient and Family Stated Goals: full healing of both LEs Date of Onset: 05/14/12 Prior Treatments: antibiotics/IP wound care  Pain Assessment Pain Assessment Pain Score: 8  (when debridment) Pain Type: Acute pain Pain Location: Leg Pain Orientation: Right;Left Patients Stated Pain Goal: 2 Pain Intervention(s): Traction;Emotional support  Wound Therapy Wound 11/13/12 Other (Comment) Right;Lateral;Lower (Active)  Site / Wound Assessment Yellow;Red;Pink 11/23/2012  5:00 PM  % Wound base Red or Granulating 35% 11/23/2012  5:00 PM  % Wound base Yellow 65% 11/23/2012  5:00 PM  % Wound base Black 0% 11/23/2012  5:00 PM  % Wound base Other (Comment) 0% 11/23/2012  5:00 PM  Peri-wound Assessment Intact;Edema 11/23/2012  5:00 PM  Wound Length (cm) 5.5 cm 11/17/2012  4:30 PM  Wound Width (cm) 2.8 cm 11/17/2012  4:30 PM  Wound Depth (cm) 0.3 cm 11/17/2012  4:30 PM  Tunneling (cm) 0 11/14/2012  3:32 PM  Undermining (cm) 0 11/14/2012  3:32 PM  Margins Unattacted edges (unapproximated) 11/23/2012  5:00 PM  Closure None 11/23/2012  5:00 PM  Drainage Amount Minimal 11/23/2012  5:00 PM  Drainage Description Sanguineous;Purulent;No odor 11/23/2012  5:00 PM  Treatment Cleansed;Debridement (Selective) 11/23/2012  5:00 PM  Dressing Type Compression wrap 11/23/2012  5:00 PM  Dressing Changed New 11/23/2012  5:00 PM  Dressing Status Clean 11/23/2012  5:00 PM     Wound 11/13/12 Other (Comment) Leg Right;Medial;Lower  (Active)  Site / Wound Assessment Red;Yellow;Pink 11/23/2012  5:00 PM  % Wound base Red or Granulating 35% 11/23/2012  5:00 PM  % Wound base Yellow 65% 11/23/2012  5:00 PM  % Wound base Black 0% 11/23/2012  5:00 PM  % Wound base Other (Comment) 0% 11/23/2012  5:00 PM  Peri-wound Assessment Intact;Erythema (non-blanchable) 11/22/2012 11:30 AM  Wound Length (cm) 4 cm 11/17/2012  4:30 PM  Wound Width (cm) 1.8 cm 11/17/2012  4:30 PM  Wound Depth (cm) 0.3 cm 11/17/2012  4:30 PM  Tunneling (cm) 0 11/14/2012  3:32 PM  Undermining (cm) 0 11/14/2012  3:32 PM  Margins Unattacted edges (unapproximated) 11/23/2012  5:00 PM  Closure None 11/23/2012  5:00 PM  Drainage Amount Minimal 11/23/2012  5:00 PM  Drainage Description Purulent;Sanguineous;No odor 11/23/2012  5:00 PM  Non-staged Wound Description Full thickness 11/23/2012  5:00 PM  Treatment Cleansed;Debridement (Selective) 11/23/2012  5:00 PM  Dressing Type Gauze (Comment);Compression wrap 11/23/2012  5:00 PM  Dressing Changed New 11/23/2012  5:00 PM  Dressing Status Clean 11/23/2012  5:00 PM     Wound 11/13/12 Other (Comment) Leg Left;Lower (Active)  Site / Wound Assessment Yellow;Pink;Red 11/23/2012  5:00 PM  % Wound base Red or Granulating 25% 11/23/2012  5:00 PM  % Wound base Yellow 75% 11/23/2012  5:00 PM  % Wound base Black 0% 11/23/2012  5:00 PM  % Wound base Other (Comment) 0% 11/23/2012  5:00 PM  Peri-wound Assessment Intact;Edema;Erythema (non-blanchable) 11/23/2012  5:00 PM  Wound Length (cm) 9 cm 11/17/2012  4:30 PM  Wound Width (cm) 4 cm 11/17/2012  4:30 PM  Wound Depth (cm) 0.4 cm 11/17/2012  4:30 PM  Tunneling (cm) 0 11/14/2012  3:32 PM  Undermining (cm) 0 11/14/2012  3:32 PM  Margins Unattacted edges (unapproximated) 11/23/2012  5:00 PM  Closure None 11/23/2012  5:00 PM  Drainage Amount Minimal 11/23/2012  5:00 PM  Drainage Description Sanguineous;Purulent;No odor 11/23/2012  5:00 PM  Treatment Cleansed;Debridement  (Selective) 11/23/2012  5:00 PM  Dressing Type Compression wrap;Gauze (Comment) 11/23/2012  5:00 PM  Dressing Changed New 11/23/2012  5:00 PM  Dressing Status Clean 11/23/2012  5:00 PM   Selective Debridement Selective Debridement - Location: wound bed that had slough Selective Debridement - Tools Used: Forceps;Scalpel;Scissors Selective Debridement - Tissue Removed: slough   Physical Therapy Assessment and Plan Wound Therapy - Assess/Plan/Recommendations Wound Therapy - Clinical Statement: Wounds have significant increased granulation but remain very tender to debridement.  Pt able to tolerate debridement with scapel greater than forceps.  Pt slough much thinner will change from santyl to medical honey.  PT LE swollen but dressing completed in hospital was from ankle to mid calf only witll wrap from MTP to knee area.   Factors Delaying/Impairing Wound Healing: Multiple medical problems Hydrotherapy Plan: Debridement;Dressing change Wound Therapy - Frequency: 3X / week Wound Therapy - Current Recommendations: PT Wound Therapy - Follow Up Recommendations: Wound Care Center Wound Plan: Cleansing with normal saline, debridement as tolerated, application of medical honey to eschar, cover with  4x4, then dry 4x4 and kerlix and coban netting to cover coban     Pt evaluated for mobility. Pt unable to come sit to stand without using hands but is able to do so I.  Pt was able to stand for two minutes without difficulty, pt able to close eyes and stand for 10 seconds without difficulty.  Pt unable to SLS on Lt LE; able to SLS on Rt LE for 3 seconds.  Pt MM strength as follows:  Hip flex B 5/5 Hip Adduction B 5/5 Hip abduction Rt 5/5 Lt 4/5 Hip extension B 3/5  Knee ext  Rt 4/5 Lt 4+/5 Knee flexion B 4/5  Gait:  Pt has decreased stride length with Lt LE needing constant verbal cuing to have equal stride length but was able to ambulate with a cane with supervision x 236ft.    Pt will be seen for  both balance/gt and wound care three times a week for four weeks. Goal of wound was wrote last week. Functional goals as follows  STG 2 weeks 1)  Pt to be able to come sit to stand without UE 2)  Pt to be able to SLS x 8 sec B to reduce risk of falling 3) Pt to be I in HEP LTG 4 weeks. 1) I in advance HEP 2) Pt to be ambulating inside without a cane 3) Pt to be able to go up and down steps reciprocally with a handrail      Problem List Patient Active Problem List   Diagnosis Date Noted  . SAH (subarachnoid hemorrhage) 11/22/2012  . Seizures secondary to [facility] 11/22/2012  . Bilateral lower leg cellulitis 11/13/2012  . Open wound of leg 11/13/2012  . Oral thrush 11/13/2012    GP    RUSSELL,CINDY  11/23/2012, 5:09 PM  Your signature is required to indicate approval of the treatment plan as stated above.  Please sign and return making a copy for your files.  You may hard copy or send electronically.  Please check one: ___1.  Approve of  this plan  ___2.  Approve of this plan with the following changes.   ____________________________                             _____________ Physician                                                                      Date

## 2012-11-24 ENCOUNTER — Ambulatory Visit (HOSPITAL_COMMUNITY): Payer: Medicare Other | Admitting: *Deleted

## 2012-11-25 ENCOUNTER — Ambulatory Visit (HOSPITAL_COMMUNITY)
Admission: RE | Admit: 2012-11-25 | Discharge: 2012-11-25 | Disposition: A | Discharge status: Home / Self Care | Payer: Medicare Other | Source: Ambulatory Visit | Attending: Family Medicine | Admitting: Family Medicine

## 2012-11-25 NOTE — Progress Notes (Signed)
Physical Therapy - Wound Therapy  Treatment   Patient Details  Name: Bridget Ball MRN: [id number] Date of Birth: [birth year]  Today's Date: 11/25/2012 Time: 1610-1720 Time Calculation (min): 70 min Charge: selective debridement >20 cm, TE 1700-1718  Visit#: 3 of 18  Re-eval: 12/17/12  Subjective Subjective Assessment Subjective: Pt reported no pain prior wound care, reported increased pain to 6/10 following debridement.  Pt entered dept ambulating with no AD>  Pain Assessment Pain Assessment Pain Score: 6  (following debridemetn) Pain Location: Leg Pain Orientation: Right;Left  Treatment  11/25/12 1700  Knee/Hip Exercises: Standing  Gait Training Instructed heel to toe gait pattern and to increase stride length  Other Standing Knee Exercises Demonstrated for proper technique hip extension, abduction, heel and toe raises, mini squats and standing knee flexion  Knee/Hip Exercises: Supine  Bridges 5 reps  Straight Leg Raises 5 reps;Limitations  Straight Leg Raises Limitations Bil LE  Knee/Hip Exercises: Sidelying  Hip ABduction 5 reps   Wound Therapy  11/25/12 1722  Subjective Assessment  Subjective Pt reported no pain prior wound care, reported increased pain to 6/10 following debridement.  Pt entered dept ambulating with no AD>  Wound  Date First Assessed/Time First Assessed: 11/13/12 0420   Wound Type: Other (Comment)  Location Orientation: Right;Lateral;Lower  Present on Admission: Yes  Site / Wound Assessment Yellow;Red;Pink  % Wound base Red or Granulating 45%  % Wound base Yellow 55%  % Wound base Black 0%  Peri-wound Assessment Intact;Edema  Margins Unattacted edges (unapproximated)  Closure None  Drainage Amount Minimal  Drainage Description Sanguineous;Purulent;No odor  Treatment Cleansed;Debridement (Selective)  Dressing Type Gauze (Comment);ABD;Compression wrap (medihoney)  Dressing Changed New  Dressing Status Clean  Wound  Date First Assessed/Time  First Assessed: 11/13/12 0420   Wound Type: Other (Comment)  Location: Leg  Location Orientation: Right;Medial;Lower  Present on Admission: Yes  Site / Wound Assessment Red;Yellow;Pink  % Wound base Red or Granulating 65%  % Wound base Yellow 35%  % Wound base Black 0%  Margins Unattacted edges (unapproximated)  Closure None  Drainage Amount Minimal  Drainage Description Purulent;Sanguineous;No odor  Non-staged Wound Description Full thickness  Treatment Cleansed;Debridement (Selective)  Dressing Type Gauze (Comment);ABD;Compression wrap (medihoney)  Dressing Changed New  Dressing Status Clean  Wound  Date First Assessed/Time First Assessed: 11/13/12 0425   Wound Type: Other (Comment)  Location: Leg  Location Orientation: Left;Lower  Present on Admission: Yes  Site / Wound Assessment Yellow  % Wound base Red or Granulating 30%  % Wound base Yellow 70%  % Wound base Black 0%  Peri-wound Assessment Intact;Edema;Erythema (non-blanchable)  Margins Unattacted edges (unapproximated)  Closure None  Drainage Amount Minimal  Drainage Description Sanguineous;Purulent;No odor  Treatment Cleansed;Debridement (Selective)  Dressing Type Gauze (Comment);ABD;Compression wrap (medihoney)  Dressing Changed New  Dressing Status Clean  Selective Debridement  Selective Debridement - Location wound bed that had slough  Selective Debridement - Tools Used Forceps;Scalpel  Selective Debridement - Tissue Removed slough  Wound Therapy - Assess/Plan/Recommendations  Wound Therapy - Clinical Statement Positive results following application of medihoney last session, increased ease of removal of slough.  Pt limited by pain with debridment with pain scale increase to 6/10.  Pt instructed diaphragmatic breathing techniques and given squeeze ball to help with pain.  Continued with medihoney, gauze and compression wrap Bil LE.  Added abd pad as procaution for drainage over weekend.  Pt given HEP worksheet and  instructured techniuqe with all exercises, pt able to verbalize correct  technique with all exercises using teach back method.    Wound Plan Cleansing with normal saline, debridement as tolerated, application of medical honey to eschar, cover with  4x4, then dry 4x4 and kerlix and coban netting to cover coban    Selective Debridement Selective Debridement - Location: wound bed that had slough Selective Debridement - Tools Used: Forceps;Scalpel Selective Debridement - Tissue Removed: slough   Physical Therapy Assessment and Plan Wound Therapy - Assess/Plan/Recommendations Wound Therapy - Clinical Statement: Positive results following application of medihoney last session, increased ease of removal of slough.  Pt limited by pain with debridment with pain scale increase to 6/10.  Pt instructed diaphragmatic breathing techniques and given squeeze ball to help with pain.  Continued with medihoney, gauze and compression wrap Bil LE.  Added abd pad as procaution for drainage over weekend.  Pt given HEP worksheet and instructured techniuqe with all exercises, pt able to verbalize correct technique with all exercises using teach back method.   Wound Plan: Cleansing with normal saline, debridement as tolerated, application of medical honey to eschar, cover with  4x4, then dry 4x4 and kerlix and coban netting to cover coban      Goals    Problem List Patient Active Problem List   Diagnosis Date Noted  . SAH (subarachnoid hemorrhage) 11/22/2012  . Seizures secondary to [facility] 11/22/2012  . Bilateral lower leg cellulitis 11/13/2012  . Open wound of leg 11/13/2012  . Oral thrush 11/13/2012    GP    Juel Burrow 11/25/2012, 5:36 PM

## 2012-11-28 ENCOUNTER — Ambulatory Visit (HOSPITAL_COMMUNITY): Payer: Medicare Other | Admitting: Physical Therapy

## 2012-11-28 ENCOUNTER — Ambulatory Visit (HOSPITAL_COMMUNITY)
Admission: RE | Admit: 2012-11-28 | Discharge: 2012-11-28 | Disposition: A | Discharge status: Home / Self Care | Payer: Medicare Other | Source: Ambulatory Visit | Attending: Family Medicine | Admitting: Family Medicine

## 2012-11-28 NOTE — Progress Notes (Signed)
Physical Therapy - Wound Therapy  Treatment   Patient Details  Name: Bridget Ball MRN: [id number] Date of Birth: Mar 21, 1928  Today's Date: 11/28/2012 Time: 4098-1191 Time Calculation (min): 50 min Charges: Selective debridement (= or < 20 cm)  Visit#: 4 of 18  Re-eval: 12/17/12  Subjective Subjective Assessment Subjective: Pt has not pain prior to wound care. Pain increased to 7/10 after debridement.  Pain Assessment Pain Assessment Pain Score: 7  (Following debridement) Pain Location: Leg Pain Orientation: Right;Left;Lower  Wound Therapy Wound 11/13/12 Other (Comment) Right;Lateral;Lower (Active)  Site / Wound Assessment Yellow;Red;Pink 11/25/2012  5:22 PM  % Wound base Red or Granulating 45% 11/25/2012  5:22 PM  % Wound base Yellow 55% 11/25/2012  5:22 PM  % Wound base Black 0% 11/25/2012  5:22 PM  % Wound base Other (Comment) 0% 11/23/2012  5:00 PM  Peri-wound Assessment Intact;Edema 11/25/2012  5:22 PM  Wound Length (cm) 5.5 cm 11/17/2012  4:30 PM  Wound Width (cm) 2.8 cm 11/17/2012  4:30 PM  Wound Depth (cm) 0.3 cm 11/17/2012  4:30 PM  Tunneling (cm) 0 11/14/2012  3:32 PM  Undermining (cm) 0 11/14/2012  3:32 PM  Margins Unattacted edges (unapproximated) 11/25/2012  5:22 PM  Closure None 11/25/2012  5:22 PM  Drainage Amount Minimal 11/25/2012  5:22 PM  Drainage Description Sanguineous;Purulent;No odor 11/25/2012  5:22 PM  Treatment Cleansed;Debridement (Selective) 11/25/2012  5:22 PM  Dressing Type Gauze (Comment);ABD;Compression wrap 11/25/2012  5:22 PM  Dressing Changed New 11/25/2012  5:22 PM  Dressing Status Clean 11/25/2012  5:22 PM     Wound 11/13/12 Other (Comment) Leg Right;Medial;Lower (Active)  Site / Wound Assessment Red;Yellow;Pink 11/25/2012  5:22 PM  % Wound base Red or Granulating 65% 11/25/2012  5:22 PM  % Wound base Yellow 35% 11/25/2012  5:22 PM  % Wound base Black 0% 11/25/2012  5:22 PM  % Wound base Other (Comment) 0% 11/23/2012  5:00 PM   Peri-wound Assessment Intact;Erythema (non-blanchable) 11/22/2012 11:30 AM  Wound Length (cm) 4 cm 11/17/2012  4:30 PM  Wound Width (cm) 1.8 cm 11/17/2012  4:30 PM  Wound Depth (cm) 0.3 cm 11/17/2012  4:30 PM  Tunneling (cm) 0 11/14/2012  3:32 PM  Undermining (cm) 0 11/14/2012  3:32 PM  Margins Unattacted edges (unapproximated) 11/25/2012  5:22 PM  Closure None 11/25/2012  5:22 PM  Drainage Amount Minimal 11/25/2012  5:22 PM  Drainage Description Purulent;Sanguineous;No odor 11/25/2012  5:22 PM  Non-staged Wound Description Full thickness 11/25/2012  5:22 PM  Treatment Cleansed;Debridement (Selective) 11/25/2012  5:22 PM  Dressing Type Gauze (Comment);ABD;Compression wrap 11/25/2012  5:22 PM  Dressing Changed New 11/25/2012  5:22 PM  Dressing Status Clean 11/25/2012  5:22 PM     Wound 11/13/12 Other (Comment) Leg Left;Lower (Active)  Site / Wound Assessment Yellow 11/25/2012  5:22 PM  % Wound base Red or Granulating 30% 11/25/2012  5:22 PM  % Wound base Yellow 70% 11/25/2012  5:22 PM  % Wound base Black 0% 11/25/2012  5:22 PM  % Wound base Other (Comment) 0% 11/23/2012  5:00 PM  Peri-wound Assessment Intact;Edema;Erythema (non-blanchable) 11/25/2012  5:22 PM  Wound Length (cm) 9 cm 11/17/2012  4:30 PM  Wound Width (cm) 4 cm 11/17/2012  4:30 PM  Wound Depth (cm) 0.4 cm 11/17/2012  4:30 PM  Tunneling (cm) 0 11/14/2012  3:32 PM  Undermining (cm) 0 11/14/2012  3:32 PM  Margins Unattacted edges (unapproximated) 11/25/2012  5:22 PM  Closure None 11/25/2012  5:22 PM  Drainage Amount Minimal 11/25/2012  5:22 PM  Drainage Description Sanguineous;Purulent;No odor 11/25/2012  5:22 PM  Treatment Cleansed;Debridement (Selective) 11/25/2012  5:22 PM  Dressing Type Gauze (Comment);ABD;Compression wrap 11/25/2012  5:22 PM  Dressing Changed New 11/25/2012  5:22 PM  Dressing Status Clean 11/25/2012  5:22 PM     Physical Therapy Assessment and Plan Wound Therapy -  Assess/Plan/Recommendations Wound Therapy - Clinical Statement: Pt is extremely anxious throughout session. Pt continues to be very sensitive to debridement. Wound appears to be progressing well. Improved granulation noted. Wounds are without signs or sx of infection. Continues to medihoney dressing as wounds have responded well to it with previous treatments. Wound Plan: Cleansing with normal saline, debridement as tolerated, application of medical honey, cover with  4x4, then dry 4x4 and kerlix and coban netting to cover coban.  Problem List Patient Active Problem List   Diagnosis Date Noted  . SAH (subarachnoid hemorrhage) 11/22/2012  . Seizures secondary to [facility] 11/22/2012  . Bilateral lower leg cellulitis 11/13/2012  . Open wound of leg 11/13/2012  . Oral thrush 11/13/2012    Seth Bake, PTA 11/28/2012, 5:50 PM

## 2012-11-29 ENCOUNTER — Telehealth: Payer: Self-pay | Admitting: Neurology

## 2012-11-30 ENCOUNTER — Ambulatory Visit (HOSPITAL_COMMUNITY)
Admission: RE | Admit: 2012-11-30 | Discharge: 2012-11-30 | Disposition: A | Discharge status: Home / Self Care | Payer: Medicare Other | Source: Ambulatory Visit | Attending: Family Medicine | Admitting: Family Medicine

## 2012-11-30 ENCOUNTER — Ambulatory Visit (HOSPITAL_COMMUNITY): Payer: Medicare Other | Admitting: *Deleted

## 2012-11-30 NOTE — Progress Notes (Signed)
Physical Therapy - Wound Therapy  Treatment   Patient Details  Name: Bridget Ball MRN: [id number] Date of Birth: [birth year] Charges: Selective debridement (= or < 20 cm)   Today's Date: 11/30/2012 Time: 1300-1405 Time Calculation (min): 65 min  Visit#: 5 of 18  Re-eval: 12/17/12  Subjective Subjective Assessment Subjective: Pt states that she took a nerve pill prior to session.  Pain Assessment Pain Assessment Pain Score: 7  (after debridement) Pain Location: Leg Pain Orientation: Right;Left;Lower  Wound Therapy Wound 11/13/12 Other (Comment) Right;Lateral;Lower (Active)  Site / Wound Assessment Yellow;Red;Pink 11/30/2012  5:34 PM  % Wound base Red or Granulating 45% 11/30/2012  5:34 PM  % Wound base Yellow 55% 11/30/2012  5:34 PM  % Wound base Black 0% 11/30/2012  5:34 PM  % Wound base Other (Comment) 0% 11/23/2012  5:00 PM  Peri-wound Assessment Intact;Edema 11/30/2012  5:34 PM  Wound Length (cm) 5.5 cm 11/17/2012  4:30 PM  Wound Width (cm) 2.8 cm 11/17/2012  4:30 PM  Wound Depth (cm) 0.3 cm 11/17/2012  4:30 PM  Tunneling (cm) 0 11/14/2012  3:32 PM  Undermining (cm) 0 11/14/2012  3:32 PM  Margins Unattacted edges (unapproximated) 11/30/2012  5:34 PM  Closure None 11/30/2012  5:34 PM  Drainage Amount Minimal 11/30/2012  5:34 PM  Drainage Description Sanguineous;Purulent;No odor 11/30/2012  5:34 PM  Treatment Cleansed;Debridement (Selective) 11/30/2012  5:34 PM  Dressing Type Gauze (Comment);ABD;Compression wrap 11/30/2012  5:34 PM  Dressing Changed Changed 11/30/2012  5:34 PM  Dressing Status Clean 11/30/2012  5:34 PM     Wound 11/13/12 Other (Comment) Leg Right;Medial;Lower (Active)  Site / Wound Assessment Red;Yellow;Pink 11/30/2012  5:34 PM  % Wound base Red or Granulating 65% 11/30/2012  5:34 PM  % Wound base Yellow 35% 11/30/2012  5:34 PM  % Wound base Black 0% 11/30/2012  5:34 PM  % Wound base Other (Comment) 0% 11/23/2012  5:00 PM  Peri-wound Assessment  Intact;Erythema (non-blanchable) 11/22/2012 11:30 AM  Wound Length (cm) 4 cm 11/17/2012  4:30 PM  Wound Width (cm) 1.8 cm 11/17/2012  4:30 PM  Wound Depth (cm) 0.3 cm 11/17/2012  4:30 PM  Tunneling (cm) 0 11/14/2012  3:32 PM  Undermining (cm) 0 11/14/2012  3:32 PM  Margins Unattacted edges (unapproximated) 11/30/2012  5:34 PM  Closure None 11/30/2012  5:34 PM  Drainage Amount Minimal 11/30/2012  5:34 PM  Drainage Description Purulent;Sanguineous;No odor 11/30/2012  5:34 PM  Non-staged Wound Description Full thickness 11/30/2012  5:34 PM  Treatment Cleansed;Debridement (Selective) 11/30/2012  5:34 PM  Dressing Type Gauze (Comment);ABD;Compression wrap 11/30/2012  5:34 PM  Dressing Changed Changed 11/30/2012  5:34 PM  Dressing Status Clean 11/30/2012  5:34 PM     Wound 11/13/12 Other (Comment) Leg Left;Lower (Active)  Site / Wound Assessment Yellow 11/30/2012  5:34 PM  % Wound base Red or Granulating 35% 11/30/2012  5:34 PM  % Wound base Yellow 65% 11/30/2012  5:34 PM  % Wound base Black 0% 11/30/2012  5:34 PM  % Wound base Other (Comment) 0% 11/23/2012  5:00 PM  Peri-wound Assessment Intact;Edema;Erythema (non-blanchable) 11/30/2012  5:34 PM  Wound Length (cm) 9 cm 11/17/2012  4:30 PM  Wound Width (cm) 4 cm 11/17/2012  4:30 PM  Wound Depth (cm) 0.4 cm 11/17/2012  4:30 PM  Tunneling (cm) 0 11/14/2012  3:32 PM  Undermining (cm) 0 11/14/2012  3:32 PM  Margins Unattacted edges (unapproximated) 11/30/2012  5:34 PM  Closure None 11/30/2012  5:34 PM  Drainage Amount  Minimal 11/30/2012  5:34 PM  Drainage Description Sanguineous;Purulent;No odor 11/30/2012  5:34 PM  Treatment Cleansed;Debridement (Selective) 11/30/2012  5:34 PM  Dressing Type Gauze (Comment);ABD;Compression wrap 11/30/2012  5:34 PM  Dressing Changed Changed 11/30/2012  5:34 PM  Dressing Status Clean 11/30/2012  5:34 PM   Selective Debridement Selective Debridement - Location: Throughout each wound  Selective Debridement  - Tools Used: Forceps;Scalpel Selective Debridement - Tissue Removed: slough   Physical Therapy Assessment and Plan Wound Therapy - Assess/Plan/Recommendations Wound Therapy - Clinical Statement: Pt continues to be very sensitive to debridement. Able to remove good amount of necrotic tissue with scalpel wounds appears to be progressing well. Wounds' are without signs or sx of infection.  Wound Plan: Cleansing with normal saline, debridement as tolerated, application of medical honey, cover with  4x4, then dry 4x4 and kerlix and coban netting to cover coban.      Goals    Problem List Patient Active Problem List   Diagnosis Date Noted  . SAH (subarachnoid hemorrhage) 11/22/2012  . Seizures secondary to [facility] 11/22/2012  . Bilateral lower leg cellulitis 11/13/2012  . Open wound of leg 11/13/2012  . Oral thrush 11/13/2012    GP    Seth Bake Leah 11/30/2012, 5:39 PM

## 2012-11-30 NOTE — Telephone Encounter (Signed)
sched appt, call patient and lt vm message for appt. confirmation

## 2012-12-05 ENCOUNTER — Ambulatory Visit (HOSPITAL_COMMUNITY)
Admission: RE | Admit: 2012-12-05 | Discharge: 2012-12-05 | Disposition: A | Discharge status: Home / Self Care | Payer: Medicare Other | Source: Ambulatory Visit | Attending: Family Medicine | Admitting: Family Medicine

## 2012-12-05 ENCOUNTER — Ambulatory Visit (HOSPITAL_COMMUNITY): Payer: Medicare Other | Admitting: *Deleted

## 2012-12-05 DIAGNOSIS — S81009A Unspecified open wound, unspecified knee, initial encounter: Secondary | ICD-10-CM | POA: Insufficient documentation

## 2012-12-05 DIAGNOSIS — L02419 Cutaneous abscess of limb, unspecified: Secondary | ICD-10-CM | POA: Insufficient documentation

## 2012-12-05 DIAGNOSIS — IMO0001 Reserved for inherently not codable concepts without codable children: Secondary | ICD-10-CM | POA: Insufficient documentation

## 2012-12-05 NOTE — Progress Notes (Signed)
Physical Therapy - Wound Therapy  Treatment   Patient Details  Name: Bridget Ball MRN: [id number] Date of Birth: December 18, 1928  Today's Date: 12/05/2012 Time: 1105-1200 Time Calculation (min): 55 min Charges: Deb 1 Visit#: 6 of 18  Re-eval: 12/17/12  Subjective Subjective Assessment Subjective: Pt and daugther report she changed dressings on Friday.  Feels they are looking better on the Right, Left side still does not look right. Had taken two anxiety pills this morning.  Date of Onset: 05/14/12  Pain Assessment    Wound Therapy Wound 11/13/12 Other (Comment) Right;Lateral;Lower (Active)  Site / Wound Assessment Yellow;Red;Pink 12/05/2012 12:05 PM  % Wound base Red or Granulating 55% 12/05/2012 12:05 PM  % Wound base Yellow 45% 12/05/2012 12:05 PM  % Wound base Black 0% 12/05/2012 12:05 PM  % Wound base Other (Comment) 0% 12/05/2012 12:05 PM  Peri-wound Assessment Intact;Edema 11/30/2012  5:34 PM  Wound Length (cm) 5.5 cm 11/17/2012  4:30 PM  Wound Width (cm) 2.8 cm 11/17/2012  4:30 PM  Wound Depth (cm) 0.3 cm 11/17/2012  4:30 PM  Tunneling (cm) 0 11/14/2012  3:32 PM  Undermining (cm) 0 11/14/2012  3:32 PM  Margins Unattacted edges (unapproximated) 12/05/2012 12:05 PM  Closure None 12/05/2012 12:05 PM  Drainage Amount Moderate 12/05/2012 12:05 PM  Drainage Description Sanguineous;Purulent;No odor 12/05/2012 12:05 PM  Non-staged Wound Description Partial thickness 12/05/2012 12:05 PM  Treatment Cleansed;Debridement (Selective) 12/05/2012 12:05 PM  Dressing Type Compression wrap 12/05/2012 12:05 PM  Dressing Changed New 12/05/2012 12:05 PM  Dressing Status Clean;Dry;Intact 12/05/2012 12:05 PM     Wound 11/13/12 Other (Comment) Leg Right;Medial;Lower (Active)  Site / Wound Assessment Red;Yellow;Pink 12/05/2012 12:05 PM  % Wound base Red or Granulating 75% 12/05/2012 12:05 PM  % Wound base Yellow 25% 12/05/2012 12:05 PM  % Wound base Black 0% 12/05/2012 12:05 PM  % Wound base Other  (Comment) 0% 12/05/2012 12:05 PM  Peri-wound Assessment Intact;Erythema (non-blanchable) 12/05/2012 12:05 PM  Wound Length (cm) 4 cm 11/17/2012  4:30 PM  Wound Width (cm) 1.8 cm 11/17/2012  4:30 PM  Wound Depth (cm) 0.3 cm 11/17/2012  4:30 PM  Tunneling (cm) 0 11/14/2012  3:32 PM  Undermining (cm) 0 11/14/2012  3:32 PM  Margins Unattacted edges (unapproximated) 12/05/2012 12:05 PM  Closure None 12/05/2012 12:05 PM  Drainage Amount Moderate 12/05/2012 12:05 PM  Drainage Description Purulent;Sanguineous;No odor 11/30/2012  5:34 PM  Non-staged Wound Description Full thickness 11/30/2012  5:34 PM  Treatment Cleansed;Debridement (Selective) 12/05/2012 12:05 PM  Dressing Type Compression wrap 12/05/2012 12:05 PM  Dressing Changed New 12/05/2012 12:05 PM  Dressing Status Clean 12/05/2012 12:05 PM     Wound 11/13/12 Other (Comment) Leg Left;Lower (Active)  Site / Wound Assessment Yellow;Red 12/05/2012 12:05 PM  % Wound base Red or Granulating 35% 12/05/2012 12:05 PM  % Wound base Yellow 65% 12/05/2012 12:05 PM  % Wound base Black 0% 12/05/2012 12:05 PM  % Wound base Other (Comment) 0% 11/23/2012  5:00 PM  Peri-wound Assessment Intact;Edema;Erythema (non-blanchable) 12/05/2012 12:05 PM  Wound Length (cm) 9 cm 11/17/2012  4:30 PM  Wound Width (cm) 4 cm 11/17/2012  4:30 PM  Wound Depth (cm) 0.4 cm 11/17/2012  4:30 PM  Tunneling (cm) 0 11/14/2012  3:32 PM  Undermining (cm) 0 11/14/2012  3:32 PM  Margins Unattacted edges (unapproximated) 12/05/2012 12:05 PM  Closure None 11/30/2012  5:34 PM  Drainage Amount Moderate 12/05/2012 12:05 PM  Drainage Description Purulent;No odor;Serosanguineous 12/05/2012 12:05 PM  Treatment Cleansed;Debridement (Selective) 12/05/2012  12:05 PM  Dressing Type Compression wrap 12/05/2012 12:05 PM  Dressing Changed New 12/05/2012 12:05 PM  Dressing Status Clean;Dry;Intact 12/05/2012 12:05 PM   Selective Debridement Selective Debridement - Location: Throughout each wound  Selective  Debridement - Tools Used: Forceps Selective Debridement - Tissue Removed: slough   Physical Therapy Assessment and Plan Wound Therapy - Assess/Plan/Recommendations Wound Therapy - Clinical Statement: Pt continues to have extreme sensitivity to Left lower leg wound with debridment.  Only used forceps today with max cueign for diaphragmatic breathing to decrease anxiety to improve circulation and decrease fear of wound care.  Able to be more aggressive with debridment to RLE and is overally looking heathier.  Wound Plan: f/u on profore lite and address wound care.       Goals    Problem List Patient Active Problem List   Diagnosis Date Noted  . SAH (subarachnoid hemorrhage) 11/22/2012  . Seizures secondary to [facility] 11/22/2012  . Bilateral lower leg cellulitis 11/13/2012  . Open wound of leg 11/13/2012  . Oral thrush 11/13/2012    GP    Bridget Ball 12/05/2012, 12:15 PM

## 2012-12-07 ENCOUNTER — Ambulatory Visit (HOSPITAL_COMMUNITY)
Admission: RE | Admit: 2012-12-07 | Discharge: 2012-12-07 | Disposition: A | Discharge status: Home / Self Care | Payer: Medicare Other | Source: Ambulatory Visit | Attending: Family Medicine | Admitting: Family Medicine

## 2012-12-07 NOTE — Progress Notes (Signed)
Physical Therapy - Wound Therapy  Treatment   Patient Details  Name: Bridget Ball MRN: [id number] Date of Birth: 1928/01/11  Today's Date: 12/07/2012 Time: 8413-2440 Time Calculation (min): 65 min  Visit#: 7 of 18  Re-eval: 12/17/12  Subjective Subjective Assessment Subjective: Pt states that her right leg does not hurt near as much as her left Patient and Family Stated Goals: full healing of both LEs Date of Onset: 05/14/12 Prior Treatments: antibiotics/IP wound care  Pain Assessment Pain Assessment Pain Score: 7  (when debriding; no pain when nobody is debriding)  Wound Therapy Wound 11/13/12 Other (Comment) Right;Lateral;Lower (Active)  Site / Wound Assessment Yellow;Red;Pink 12/07/2012  4:37 PM  % Wound base Red or Granulating 60% 12/07/2012  4:37 PM  % Wound base Yellow 40% 12/07/2012  4:37 PM  % Wound base Black 0% 12/07/2012  4:37 PM  % Wound base Other (Comment) 0% 12/07/2012  4:37 PM  Peri-wound Assessment Intact 12/07/2012  4:37 PM  Wound Length (cm) 5.5 cm 11/17/2012  4:30 PM  Wound Width (cm) 2.8 cm 11/17/2012  4:30 PM  Wound Depth (cm) 0.3 cm 11/17/2012  4:30 PM  Tunneling (cm) 0 11/14/2012  3:32 PM  Undermining (cm) 0 11/14/2012  3:32 PM  Margins Unattacted edges (unapproximated) 12/07/2012  4:37 PM  Closure None 12/07/2012  4:37 PM  Drainage Amount Moderate 12/07/2012  4:37 PM  Drainage Description Purulent;No odor 12/07/2012  4:37 PM  Non-staged Wound Description Partial thickness 12/05/2012 12:05 PM  Treatment Cleansed;Debridement (Selective) 12/07/2012  4:37 PM  Dressing Type Compression wrap 12/07/2012  4:37 PM  Dressing Changed New 12/07/2012  4:37 PM  Dressing Status Clean 12/07/2012  4:37 PM     Wound 11/13/12 Other (Comment) Leg Right;Medial;Lower (Active)  Site / Wound Assessment Pink;Red;Yellow 12/07/2012  4:37 PM  % Wound base Red or Granulating 75% 12/07/2012  4:37 PM  % Wound base Yellow 25% 12/07/2012  4:37 PM  % Wound base Black 0% 12/07/2012  4:37 PM   % Wound base Other (Comment) 0% 12/07/2012  4:37 PM  Peri-wound Assessment Intact 12/07/2012  4:37 PM  Wound Length (cm) 4 cm 11/17/2012  4:30 PM  Wound Width (cm) 1.8 cm 11/17/2012  4:30 PM  Wound Depth (cm) 0.3 cm 11/17/2012  4:30 PM  Tunneling (cm) 0 11/14/2012  3:32 PM  Undermining (cm) 0 11/14/2012  3:32 PM  Margins Unattacted edges (unapproximated) 12/07/2012  4:37 PM  Closure None 12/07/2012  4:37 PM  Drainage Amount Moderate 12/07/2012  4:37 PM  Drainage Description Purulent;No odor 12/07/2012  4:37 PM  Non-staged Wound Description Full thickness 11/30/2012  5:34 PM  Treatment Cleansed;Debridement (Selective) 12/07/2012  4:37 PM  Dressing Type Compression wrap 12/07/2012  4:37 PM  Dressing Changed New 12/07/2012  4:37 PM  Dressing Status Clean 12/07/2012  4:37 PM     Wound 11/13/12 Other (Comment) Leg Left;Lower (Active)  Site / Wound Assessment Yellow;Red 12/07/2012  4:37 PM  % Wound base Red or Granulating 35% 12/07/2012  4:37 PM  % Wound base Yellow 65% 12/07/2012  4:37 PM  % Wound base Black 0% 12/07/2012  4:37 PM  % Wound base Other (Comment) 0% 12/07/2012  4:37 PM  Peri-wound Assessment Intact;Edema;Erythema (non-blanchable) 12/05/2012 12:05 PM  Wound Length (cm) 9 cm 11/17/2012  4:30 PM  Wound Width (cm) 4 cm 11/17/2012  4:30 PM  Wound Depth (cm) 0.4 cm 11/17/2012  4:30 PM  Tunneling (cm) 0 11/14/2012  3:32 PM  Undermining (cm) 0 11/14/2012  3:32  PM  Margins Unattacted edges (unapproximated) 12/07/2012  4:37 PM  Closure None 12/07/2012  4:37 PM  Drainage Amount Moderate 12/07/2012  4:37 PM  Drainage Description Purulent;No odor 12/07/2012  4:37 PM  Treatment Cleansed;Debridement (Selective) 12/07/2012  4:37 PM  Dressing Type Compression wrap 12/07/2012  4:37 PM  Dressing Changed New 12/07/2012  4:37 PM  Dressing Status Clean 12/07/2012  4:37 PM   Selective Debridement Selective Debridement - Location: the entire wound bed of all  Selective Debridement - Tools Used:  Forceps;Scalpel Selective Debridement - Tissue Removed: slough   Physical Therapy Assessment and Plan Wound Therapy - Assess/Plan/Recommendations Wound Therapy - Clinical Statement: [name] wounds continue to improve in granulation.  Wounds on Rt LE are healing quicker but they were not as deep as the wound on pt Lt LE.  Pt had improved toleance for debridement.  Applied profore to wounds as pt tolerated Lite II .   Factors Delaying/Impairing Wound Healing: Multiple medical problems Hydrotherapy Plan: Debridement;Dressing change Wound Therapy - Frequency: 3X / week          Problem List Patient Active Problem List   Diagnosis Date Noted  . SAH (subarachnoid hemorrhage) 11/22/2012  . Seizures secondary to [facility] 11/22/2012  . Bilateral lower leg cellulitis 11/13/2012  . Open wound of leg 11/13/2012  . Oral thrush 11/13/2012    GP    RUSSELL,CINDY 12/07/2012, 4:47 PM

## 2012-12-08 ENCOUNTER — Ambulatory Visit (HOSPITAL_COMMUNITY): Payer: Medicare Other

## 2012-12-08 ENCOUNTER — Telehealth: Payer: Self-pay | Admitting: Neurology

## 2012-12-08 NOTE — Telephone Encounter (Signed)
Spoke to the patient's daughter. She has been having dizziness and confusion after leaving the hospital after Depakote was added to the Keppra. She has been seizure-free for more than a week. I recommend discontinuing Depakote ER 500 daily and continuing Keppra thousand twice daily. She'll arise to call back in a week or earlier if she has breakthrough seizures. Keep scheduled appointment with me next month

## 2012-12-08 NOTE — Telephone Encounter (Signed)
Spoke with daughter and she wants to speak with Dr Pearlean Brownie concerning patient's recent abnormal confusion, dizziness,is currently prescribed keppra/ depakote since a small seizure/ brain bleed.  Should they decrease the medication dosage or is this a symptom of the bleed?

## 2012-12-09 ENCOUNTER — Ambulatory Visit (HOSPITAL_COMMUNITY)
Admission: RE | Admit: 2012-12-09 | Discharge: 2012-12-09 | Disposition: A | Discharge status: Home / Self Care | Payer: Medicare Other | Source: Ambulatory Visit | Attending: Family Medicine | Admitting: Family Medicine

## 2012-12-09 ENCOUNTER — Ambulatory Visit (HOSPITAL_COMMUNITY): Payer: Medicare Other | Admitting: Physical Therapy

## 2012-12-09 NOTE — Progress Notes (Signed)
Physical Therapy - Wound Therapy  Treatment   Patient Details  Name: DEETTA SIEGMANN MRN: [id number] Date of Birth: 1928/01/14  Today's Date: 12/09/2012 Time: 7829-5621 Time Calculation (min): 86 min Charge debridement 3086-5784; neuromuscular re-ed 1530-1555; the ex no charge nu step [phone]  Visit#: 8 of 18  Re-eval: 12/17/12  Subjective Subjective Assessment Subjective: Pt states Profore wrap was too much prefers profore lite  Pain Assessment Pain Assessment Pain Assessment:  (7/10 with debridement)  Wound Therapy Wound 11/13/12 Other (Comment) Right;Lateral;Lower (Active)  Site / Wound Assessment Pink;Red;Yellow 12/09/2012  4:04 PM  % Wound base Red or Granulating 70% 12/09/2012  4:04 PM  % Wound base Yellow 25% 12/09/2012  4:04 PM  % Wound base Black 0% 12/09/2012  4:04 PM  % Wound base Other (Comment) 0% 12/09/2012  4:04 PM  Peri-wound Assessment Intact 12/09/2012  4:04 PM  Wound Length (cm) 5.5 cm 11/17/2012  4:30 PM  Wound Width (cm) 2.8 cm 11/17/2012  4:30 PM  Wound Depth (cm) 0.3 cm 11/17/2012  4:30 PM  Margins Unattacted edges (unapproximated) 12/09/2012  4:04 PM  Closure None 12/09/2012  4:04 PM  Drainage Amount Moderate 12/09/2012  4:04 PM  Drainage Description Purulent;No odor 12/09/2012  4:04 PM  Non-staged Wound Description Partial thickness 12/09/2012  4:04 PM  Treatment Cleansed;Debridement (Selective) 12/09/2012  4:04 PM  Dressing Type Compression wrap 12/09/2012  4:04 PM  Dressing Changed New 12/09/2012  4:04 PM  Dressing Status Clean 12/09/2012  4:04 PM     Wound 11/13/12 Other (Comment) Leg Right;Medial;Lower (Active)  Site / Wound Assessment Yellow;Red 12/09/2012  4:04 PM  % Wound base Red or Granulating 80% 12/09/2012  4:04 PM  % Wound base Yellow 20% 12/09/2012  4:04 PM  % Wound base Black 0% 12/09/2012  4:04 PM  % Wound base Other (Comment) 0% 12/09/2012  4:04 PM  Peri-wound Assessment Intact 12/09/2012  4:04 PM  Wound Length (cm) 4 cm 11/17/2012  4:30 PM  Wound  Width (cm) 1.8 cm 11/17/2012  4:30 PM  Wound Depth (cm) 0.3 cm 11/17/2012  4:30 PM  Margins Unattacted edges (unapproximated) 12/09/2012  4:04 PM  Closure None 12/09/2012  4:04 PM  Drainage Amount Moderate 12/09/2012  4:04 PM  Drainage Description Purulent;No odor 12/09/2012  4:04 PM  Non-staged Wound Description Full thickness 12/09/2012  4:04 PM  Treatment Cleansed;Debridement (Selective) 12/09/2012  4:04 PM  Dressing Type Compression wrap 12/09/2012  4:04 PM  Dressing Changed Changed 12/09/2012  4:04 PM  Dressing Status Clean 12/09/2012  4:04 PM     Wound 11/13/12 Other (Comment) Leg Left;Lower (Active)  Site / Wound Assessment Red;Yellow 12/09/2012  4:04 PM  % Wound base Red or Granulating 35% 12/09/2012  4:04 PM  % Wound base Yellow 65% 12/09/2012  4:04 PM  % Wound base Black 0% 12/09/2012  4:04 PM  % Wound base Other (Comment) 0% 12/09/2012  4:04 PM  Peri-wound Assessment Intact 12/09/2012  4:04 PM  Wound Length (cm) 9 cm 11/17/2012  4:30 PM  Wound Width (cm) 4 cm 11/17/2012  4:30 PM  Wound Depth (cm) 0.4 cm 11/17/2012  4:30 PM  Margins Unattacted edges (unapproximated) 12/09/2012  4:04 PM  Closure None 12/09/2012  4:04 PM  Drainage Amount Moderate 12/09/2012  4:04 PM  Drainage Description Purulent;No odor 12/09/2012  4:04 PM  Treatment Cleansed;Debridement (Selective) 12/09/2012  4:04 PM  Dressing Type Compression wrap 12/09/2012  4:04 PM  Dressing Changed Changed 12/09/2012  4:04 PM  Dressing Status Clean 12/09/2012  4:04 PM   Selective Debridement Selective Debridement - Location: entire wound bed of all wounds Selective Debridement - Tools Used: Forceps;Scalpel Selective Debridement - Tissue Removed: slough    Sit to stand x 10 Wall bumps shoulder and pelvis x 10; tandem stance x 1 min x 2 B; marching in place x 10 ; side step, retro gt x 2 RT. SLS all exercises facilitates by therapist.  Nustep L1 x 10:00. Physical Therapy Assessment and Plan Wound Therapy -  Assess/Plan/Recommendations Wound Therapy - Clinical Statement: Wounds continue to have improved granulation.  Pt unable to tolerate profore on Lt LE return to porfore lite; Rt LE with 4x4, kerlix  and coban.  Pt began balance and strengthening exercises  Wound Plan: continue with profore light dressing.      Goals    Problem List Patient Active Problem List   Diagnosis Date Noted  . SAH (subarachnoid hemorrhage) 11/22/2012  . Seizures secondary to [facility] 11/22/2012  . Bilateral lower leg cellulitis 11/13/2012  . Open wound of leg 11/13/2012  . Oral thrush 11/13/2012    GP    RUSSELL,CINDY 12/09/2012, 4:14 PM

## 2012-12-12 ENCOUNTER — Ambulatory Visit (HOSPITAL_COMMUNITY)
Admission: RE | Admit: 2012-12-12 | Discharge: 2012-12-12 | Disposition: A | Discharge status: Home / Self Care | Payer: Medicare Other | Source: Ambulatory Visit | Attending: Family Medicine | Admitting: Family Medicine

## 2012-12-12 NOTE — Progress Notes (Signed)
Physical Therapy Treatment Patient Details  Name: Bridget Ball MRN: [id number] Date of Birth: 22-Dec-1928  Today's Date: 12/12/2012 Time: 1430-1610 PT Time Calculation (min): 100 min  Visit#: 9 of 18  Re-eval: 12/17/12 Charges: Selective debridement (= or < 20 cm)(1435-1530) NMR x 23'(1532-1555) Therex x 10'(1600-1610)   Authorization: [employer] medicare  Authorization Visit#: 9 of 10   Subjective: Symptoms/Limitations Symptoms: Pt's daughter states that pt is easily distracted when completing exercises at home. Pain Assessment Currently in Pain?: Yes Pain Score: 4  Pain Location: Leg Pain Orientation: Right;Left;Lower  Wound Therapy Wound 11/13/12 Other (Comment) Right;Lateral;Lower (Active)  Site / Wound Assessment Pink;Red;Yellow 12/12/2012  4:00 PM  % Wound base Red or Granulating 85% 12/12/2012  4:00 PM  % Wound base Yellow 15% 12/12/2012  4:00 PM  % Wound base Black 0% 12/12/2012  4:00 PM  % Wound base Other (Comment) 0% 12/12/2012  4:00 PM  Peri-wound Assessment Intact 12/12/2012  4:00 PM  Wound Length (cm) 5.5 cm 11/17/2012  4:30 PM  Wound Width (cm) 2.8 cm 11/17/2012  4:30 PM  Wound Depth (cm) 0.3 cm 11/17/2012  4:30 PM  Margins Unattacted edges (unapproximated) 12/12/2012  4:00 PM  Closure None 12/12/2012  4:00 PM  Drainage Amount Moderate 12/12/2012  4:00 PM  Drainage Description Purulent;No odor 12/12/2012  4:00 PM  Non-staged Wound Description Partial thickness 12/12/2012  4:00 PM  Treatment Cleansed;Debridement (Selective) 12/09/2012  4:04 PM  Dressing Type Compression wrap 12/12/2012  4:00 PM  Dressing Changed Changed 12/12/2012  4:00 PM  Dressing Status Clean 12/12/2012  4:00 PM     Wound 11/13/12 Other (Comment) Leg Right;Medial;Lower (Active)  Site / Wound Assessment Yellow;Red 12/12/2012  4:00 PM  % Wound base Red or Granulating 85% 12/12/2012  4:00 PM  % Wound base Yellow 25% 12/12/2012  4:00 PM  % Wound base Black 0% 12/12/2012  4:00 PM  % Wound base Other (Comment) 0%  12/12/2012  4:00 PM  Peri-wound Assessment Intact 12/12/2012  4:00 PM  Wound Length (cm) 4 cm 11/17/2012  4:30 PM  Wound Width (cm) 1.8 cm 11/17/2012  4:30 PM  Wound Depth (cm) 0.3 cm 11/17/2012  4:30 PM  Margins Unattacted edges (unapproximated) 12/12/2012  4:00 PM  Closure None 12/12/2012  4:00 PM  Drainage Amount Moderate 12/12/2012  4:00 PM  Drainage Description Purulent;No odor 12/12/2012  4:00 PM  Non-staged Wound Description Full thickness 12/12/2012  4:00 PM  Treatment Cleansed;Debridement (Selective) 12/12/2012  4:00 PM  Dressing Type Compression wrap 12/12/2012  4:00 PM  Dressing Changed Changed 12/12/2012  4:00 PM  Dressing Status Clean 12/12/2012  4:00 PM     Wound 11/13/12 Other (Comment) Leg Left;Lower (Active)  Site / Wound Assessment Red;Yellow 12/12/2012  4:00 PM  % Wound base Red or Granulating 40% 12/12/2012  4:00 PM  % Wound base Yellow 60% 12/12/2012  4:00 PM  % Wound base Black 0% 12/12/2012  4:00 PM  % Wound base Other (Comment) 0% 12/12/2012  4:00 PM  Peri-wound Assessment Intact 12/12/2012  4:00 PM  Wound Length (cm) 9 cm 11/17/2012  4:30 PM  Wound Width (cm) 4 cm 11/17/2012  4:30 PM  Wound Depth (cm) 0.4 cm 11/17/2012  4:30 PM  Margins Unattacted edges (unapproximated) 12/12/2012  4:00 PM  Closure None 12/12/2012  4:00 PM  Drainage Amount Moderate 12/12/2012  4:00 PM  Drainage Description Purulent;No odor 12/12/2012  4:00 PM  Treatment Cleansed;Debridement (Selective) 12/12/2012  4:00 PM  Dressing Type Compression wrap 12/12/2012  4:00 PM  Dressing Changed Changed 12/12/2012  4:00 PM  Dressing Status Clean 12/12/2012  4:00 PM   Selective Debridement Selective Debridement - Location: entire wound bed of all wounds Selective Debridement - Tools Used: Forceps;Scalpel Selective Debridement - Tissue Removed: slough   Exercise/Treatments Standing Tandem Gait: 2 reps Heel Raises: 10 reps Toe Raise: 10 reps Other Standing Exercises: Functional squats x 10; rocker board x  2' Other Standing Exercises: NuStep x 10' hills # 3 L3   Physical Therapy Assessment and Plan PT Assessment and Plan Clinical Impression Statement: Wound care completed to BLE. Wound appears to be improving. Granulation continues to increase. Skin integrity has improved at periwound. LLE was wrapped with profore lite. RLE wrapped with kerlex and coban. Progressed standing exercises with minimal difficulty after initial cueing and demo. Pt requires multimodal cueing to avoid looking down when completing tandem gait.  PT Plan: Continue wound care and strength/balance training per PT POC.    Problem List Patient Active Problem List   Diagnosis Date Noted  . SAH (subarachnoid hemorrhage) 11/22/2012  . Seizures secondary to [facility] 11/22/2012  . Bilateral lower leg cellulitis 11/13/2012  . Open wound of leg 11/13/2012  . Oral thrush 11/13/2012    PT - End of Session Activity Tolerance: Patient tolerated treatment well General Behavior During Therapy: [facility] for tasks assessed/performed  Seth Bake, PTA 12/12/2012, 4:37 PM

## 2012-12-14 ENCOUNTER — Ambulatory Visit (HOSPITAL_COMMUNITY)
Admission: RE | Admit: 2012-12-14 | Discharge: 2012-12-14 | Disposition: A | Discharge status: Home / Self Care | Payer: Medicare Other | Source: Ambulatory Visit | Attending: Family Medicine | Admitting: Family Medicine

## 2012-12-14 NOTE — Progress Notes (Addendum)
Physical Therapy Treatment (G-code update) Patient Details  Name: Bridget Ball MRN: [id number] Date of Birth: 28-Jun-1928  Today's Date: 12/14/2012 Time: 1432-1600 PT Time Calculation (min): 88 min Charges: Selective debridement (= or < 20 cm)(1432-1520) NMR x 25'(1525-1550)   Visit#: 10 of 18  Re-eval: 12/17/12  Authorization: [employer] medicare  Authorization Visit#: 10 of 10   Subjective: Symptoms/Limitations Symptoms: Pt states that when she takes her pain medicine it makes her feel drunk. Pain Assessment Currently in Pain?: Yes Pain Score: 4  Pain Location: Leg Pain Orientation: Right;Left;Lower   12/14/12 1700  Evaluation and Treatment  Evaluation and Treatment Procedures Explained to Patient/Family Yes  Evaluation and Treatment Procedures agreed to  Wound  Date First Assessed/Time First Assessed: 11/13/12 0420   Wound Type: Other (Comment)  Location Orientation: Right;Lateral;Lower  Present on Admission: Yes  Site / Wound Assessment Pink;Red;Yellow  % Wound base Red or Granulating 85%  Was 20%  % Wound base Yellow 15% was 80%  % Wound base Black 0% was 0  % Wound base Other (Comment) 0% was 0  Peri-wound Assessment Intact  Margins Unattacted edges (unapproximated)  Closure None  Drainage Amount Moderate  Drainage Description Purulent;No odor  Treatment Cleansed;Debridement (Selective)  Dressing Type Compression wrap  Dressing Changed Changed  Dressing Status Clean  Wound  Date First Assessed/Time First Assessed: 11/13/12 0420   Wound Type: Other (Comment)  Location: Leg  Location Orientation: Right;Medial;Lower  Present on Admission: Yes  Site / Wound Assessment Yellow;Red  % Wound base Red or Granulating 85% was 20%  % Wound base Yellow 25% was 80%  % Wound base Black 0%  % Wound base Other (Comment) 0%  Peri-wound Assessment Intact  Margins Unattacted edges (unapproximated)  Closure None  Drainage Amount Moderate  Drainage Description Purulent;No odor   Treatment Cleansed;Debridement (Selective)  Dressing Type Compression wrap (honey)  Dressing Changed Changed  Dressing Status Clean  Wound  Date First Assessed/Time First Assessed: 11/13/12 0425   Wound Type: Other (Comment)  Location: Leg  Location Orientation: Left;Lower  Present on Admission: Yes  Site / Wound Assessment Red;Yellow  % Wound base Red or Granulating 40% was 20%  % Wound base Yellow 60% was 80%  % Wound base Black 0%  % Wound base Other (Comment) 0%  Peri-wound Assessment Intact  Margins Unattacted edges (unapproximated)  Closure None  Drainage Amount Moderate  Drainage Description Purulent;No odor  Treatment Cleansed;Debridement (Selective)  Dressing Type Compression wrap (honey)  Dressing Changed Changed  Dressing Status Clean  Selective Debridement  Selective Debridement - Location Entire wound bed of all wounds  Selective Debridement - Tools Used Forceps;Scalpel  Selective Debridement - Tissue Removed slough   Exercise/Treatments Balance Exercises Standing Standing Eyes Opened: Wide (BOA);Head turns;5 reps Standing Eyes Closed: Narrow base of support (BOS);2 reps;30 secs Tandem Stance: 2 reps;Intermittent HHA;Eyes open;30 secs Tandem Gait: 2 reps Retro Gait: 2 reps Heel Raises: 10 reps Toe Raise: 10 reps Other Standing Exercises: NuStep x 10' hills # 3 L3   Physical Therapy Assessment and Plan PT Assessment and Plan Clinical Impression Statement: Wound continues to approximate well. Pt is more sensitive to debridement this session as she took less pain medicine today then last session. Clinic is out of profore therefore BEL were wrapped with kerlex and coban. and covered with #5 netting. Pt's balance and cognition seem slightly impaired this session. Pt's daughter states that pt took 2 anti-anxiety pills today rather than one and this may be the cause  of this. Pt presents with one total LOB require max assist to recover. PT Plan: Continue wound care  and strength/balance training per PT POC.    Problem List Patient Active Problem List   Diagnosis Date Noted  . SAH (subarachnoid hemorrhage) 11/22/2012  . 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 Bilateral lower leg cellulitis 11/13/2012  . Open wound of leg 11/13/2012  . Oral thrush 11/13/2012    PT - End of Session Activity Tolerance: Patient tolerated treatment well General Behavior During Therapy: WFL for tasks assessed/performed  GP Functional Assessment Tool Used: clinical judgement Functional Limitation: Other PT primary Other PT Primary Current Status (U9811): At least 60 percent but less than 80 percent impaired, limited or restricted Other PT Primary Goal Status (B1478): At least 1 percent but less than 20 percent impaired, limited or restricted  Seth Bake, PTA  12/14/2012, 5:07 PM

## 2012-12-16 ENCOUNTER — Ambulatory Visit (HOSPITAL_COMMUNITY)
Admission: RE | Admit: 2012-12-16 | Discharge: 2012-12-16 | Disposition: A | Discharge status: Home / Self Care | Payer: Medicare Other | Source: Ambulatory Visit | Attending: Family Medicine | Admitting: Family Medicine

## 2012-12-16 NOTE — Progress Notes (Signed)
Physical Therapy - Wound Therapy  Treatment/Reassessment    Patient Details  Name: Bridget Ball MRN: [id number] Date of Birth: 30-Mar-1928  Today's Date: 12/16/2012 Time: 1191-4782 Time Calculation (min): 95 min Charge:  Debride 1345-1440; neuro re-ed x 30 [phone]) Visit#: 11 of 18  Re-eval: 01/15/13  Subjective Subjective Assessment Subjective: Pt states that she continues to have extreme pain when debridement is occuring but pain subsides approximately thirty minutes after treatment.  Pain Assessment Pain Assessment Pain Assessment:  (with debridement) Pain Score: 8  Pain Location: Leg Pain Orientation: Right;Left  Wound Therapy Wound 11/13/12 Other (Comment) Right;Lateral;Lower (Active)  Site / Wound Assessment Pink;Yellow 12/16/2012  3:21 PM  % Wound base Red or Granulating 85% was 20% 12/16/2012  3:21 PM  % Wound base Yellow 15% was 80% 12/16/2012  3:21 PM  % Wound base Black 0% 12/16/2012  3:21 PM  % Wound base Other (Comment) 0% 12/16/2012  3:21 PM  Peri-wound Assessment Intact 12/16/2012  3:21 PM  Wound Length (cm) 4.2 cm was 5.5 12/16/2012  3:21 PM  Wound Width (cm) 2 cm was 2.8 12/16/2012  3:21 PM  Wound Depth (cm) 0 cm was .3 12/16/2012  3:21 PM  Margins Unattacted edges (unapproximated) 12/16/2012  3:21 PM  Closure None 12/16/2012  3:21 PM  Drainage Amount Minimal 12/16/2012  3:21 PM  Drainage Description No odor;Serous 12/16/2012  3:21 PM  Non-staged Wound Description Full thickness 12/16/2012  3:21 PM  Treatment Cleansed;Debridement (Selective) 12/16/2012  3:21 PM  Dressing Type Compression wrap;Honeycomb 12/16/2012  3:21 PM  Dressing Changed Changed 12/16/2012  3:21 PM  Dressing Status Clean 12/16/2012  3:21 PM     Wound 11/13/12 Other (Comment) Leg Right;Medial;Lower (Active)  Site / Wound Assessment Yellow;Red 12/16/2012  3:21 PM  % Wound base Red or Granulating 90% was 20% 12/16/2012  3:21 PM  % Wound base Yellow 10% was 80% 12/16/2012  3:21  PM  % Wound base Black 0% 12/16/2012  3:21 PM  % Wound base Other (Comment) 0% 12/16/2012  3:21 PM  Peri-wound Assessment Intact 12/16/2012  3:21 PM  Wound Length (cm) 1.5 cm was 4.0 12/16/2012  3:21 PM  Wound Width (cm) 0.6 cm was 1.8 12/16/2012  3:21 PM  Wound Depth (cm) 0 cm was .3 12/16/2012  3:21 PM  Margins Unattacted edges (unapproximated) 12/16/2012  3:21 PM  Closure None 12/16/2012  3:21 PM  Drainage Amount Minimal 12/16/2012  3:21 PM  Drainage Description Serous 12/16/2012  3:21 PM  Non-staged Wound Description Partial thickness 12/16/2012  3:21 PM  Treatment Cleansed;Debridement (Selective) 12/16/2012  3:21 PM  Dressing Type Compression wrap;Honeycomb 12/16/2012  3:21 PM  Dressing Changed Changed 12/16/2012  3:21 PM  Dressing Status Clean 12/16/2012  3:21 PM     Wound 11/13/12 Other (Comment) Leg Left;Lower (Active)  Site / Wound Assessment Red;Yellow 12/16/2012  3:21 PM  % Wound base Red or Granulating 40% was 80% 12/16/2012  3:21 PM  % Wound base Yellow 60% was 20% 12/16/2012  3:21 PM  % Wound base Black 0% 12/16/2012  3:21 PM  % Wound base Other (Comment) 0% 12/16/2012  3:21 PM  Peri-wound Assessment Intact 12/14/2012  5:00 PM  Wound Length (cm) 7.8 cm was 9.0cm 12/16/2012  3:21 PM  Wound Width (cm) 3.5 cm was 4.0 cm 12/16/2012  3:21 PM  Wound Depth (cm) 0 cm was .4 cm 12/16/2012  3:21 PM  Margins Unattacted edges (unapproximated) 12/16/2012  3:21 PM  Closure None 12/16/2012  3:21 PM  Drainage Amount Minimal 12/16/2012  3:21 PM  Drainage Description Serous 12/16/2012  3:21 PM  Treatment Cleansed;Debridement (Selective) 12/16/2012  3:21 PM  Dressing Type Compression wrap 12/16/2012  3:21 PM  Dressing Changed Changed 12/16/2012  3:21 PM  Dressing Status Clean 12/16/2012  3:21 PM   Selective Debridement Selective Debridement - Location: Entire wound bed of all wounds Selective Debridement - Tools Used: Forceps;Scalpel  Pt completed tandem gt x 2 ; retro gt;  marching  x 10; tandem stance, rocker board x 2 minutes; functional squats x 10; wall bumps for shoulder and hips x 10; narrow base of support with eyes closed turning head right and left; up and down; nu-step L2 x 10:00 for improved activity tolerance.   Physical Therapy Assessment and Plan Wound Therapy - Assess/Plan/Recommendations Wound Therapy - Clinical Statement: Pt wounds decreasing in size and improving in granulation.  Pt improving om balance,(although needed facilitation to keep from loosing her balance) and strengthening. Wound Therapy - Functional Problem List: pain in both LEs limiting activity Factors Delaying/Impairing Wound Healing: Multiple medical problems Hydrotherapy Plan: Debridement;Dressing change Wound Therapy - Frequency: 3X / week Wound Therapy - Current Recommendations: PT Wound Plan: continue with debridement and balance activities three times a week for four more weeks.      Goals 1)  Decrease necrotic tissue to 0% -progressing 2) increase granulation to 100% - progressing 3)  Decrease wound size by 50% - progressing 4)  Decrease drainage to minimum amount- met 5) Pt I in self dressing-progressing (we have talked about it but pt has not had to actually dress wound since she has been in the hospital) Problem List Patient Active Problem List   Diagnosis Date Noted  . SAH (subarachnoid hemorrhage) 11/22/2012  . Seizures secondary to [facility] 11/22/2012  . Bilateral lower leg cellulitis 11/13/2012  . Open wound of leg 11/13/2012  . Oral thrush 11/13/2012    GP Functional Assessment Tool Used: clinical judgement  [place]hower,CINDY 12/16/2012, 3:51 PM

## 2012-12-17 NOTE — Progress Notes (Signed)
Proceed with treatment plan as described.

## 2012-12-19 ENCOUNTER — Ambulatory Visit (HOSPITAL_COMMUNITY)
Admission: RE | Admit: 2012-12-19 | Discharge: 2012-12-19 | Disposition: A | Discharge status: Home / Self Care | Payer: Medicare Other | Source: Ambulatory Visit | Attending: Family Medicine | Admitting: Family Medicine

## 2012-12-19 NOTE — Progress Notes (Signed)
Physical Therapy Treatment Patient Details  Name: Bridget Ball MRN: [id number] Date of Birth: [birth year]  Today's Date: 12/19/2012 Time: 1308-6578 PT Time Calculation (min): 50 min Charges: Selective debridement (= or < 20 cm)   Visit#: 12 of 18  Re-eval: 01/15/13  Authorization: [employer] medicare  Authorization Visit#: 12 of 20   Subjective: Symptoms/Limitations Symptoms: Pt states that her legs are itchy. Pain Assessment Currently in Pain?: Yes Pain Score: 4  Pain Location: Leg Pain Orientation: Right;Left;Lower   Wound Therapy Wound 11/13/12 Other (Comment) Right;Lateral;Lower (Active)  Site / Wound Assessment Pink;Yellow 12/19/2012  2:56 PM  % Wound base Red or Granulating 85% 12/19/2012  2:56 PM  % Wound base Yellow 15% 12/19/2012  2:56 PM  % Wound base Black 0% 12/19/2012  2:56 PM  % Wound base Other (Comment) 0% 12/19/2012  2:56 PM  Peri-wound Assessment Intact 12/19/2012  2:56 PM  Wound Length (cm) 4.2 cm 12/16/2012  3:21 PM  Wound Width (cm) 2 cm 12/16/2012  3:21 PM  Wound Depth (cm) 0 cm 12/16/2012  3:21 PM  Margins Unattacted edges (unapproximated) 12/19/2012  2:56 PM  Closure None 12/19/2012  2:56 PM  Drainage Amount Minimal 12/19/2012  2:56 PM  Drainage Description No odor;Serous 12/19/2012  2:56 PM  Non-staged Wound Description Full thickness 12/19/2012  2:56 PM  Treatment Debridement (Selective) 12/19/2012  2:56 PM  Dressing Type Compression wrap;ABD 12/19/2012  2:56 PM  Dressing Changed Changed 12/19/2012  2:56 PM  Dressing Status Clean 12/19/2012  2:56 PM     Wound 11/13/12 Other (Comment) Leg Right;Medial;Lower (Active)  Site / Wound Assessment Yellow;Red 12/19/2012  2:56 PM  % Wound base Red or Granulating 95% 12/19/2012  2:56 PM  % Wound base Yellow 5% 12/19/2012  2:56 PM  % Wound base Black 0% 12/19/2012  2:56 PM  % Wound base Other (Comment) 0% 12/19/2012  2:56 PM  Peri-wound Assessment Intact 12/19/2012  2:56 PM  Wound Length (cm) 1.5 cm  12/16/2012  3:21 PM  Wound Width (cm) 0.6 cm 12/16/2012  3:21 PM  Wound Depth (cm) 0 cm 12/16/2012  3:21 PM  Margins Unattacted edges (unapproximated) 12/19/2012  2:56 PM  Closure None 12/19/2012  2:56 PM  Drainage Amount Minimal 12/19/2012  2:56 PM  Drainage Description Serous 12/19/2012  2:56 PM  Non-staged Wound Description Partial thickness 12/19/2012  2:56 PM  Treatment Cleansed;Debridement (Selective) 12/19/2012  2:56 PM  Dressing Type Compression wrap;ABD 12/19/2012  2:56 PM  Dressing Changed Changed 12/19/2012  2:56 PM  Dressing Status Clean 12/19/2012  2:56 PM     Wound 11/13/12 Other (Comment) Leg Left;Lower (Active)  Site / Wound Assessment Red;Yellow 12/19/2012  2:56 PM  % Wound base Red or Granulating 55% 12/19/2012  2:56 PM  % Wound base Yellow 45% 12/19/2012  2:56 PM  % Wound base Black 0% 12/19/2012  2:56 PM  % Wound base Other (Comment) 0% 12/19/2012  2:56 PM  Peri-wound Assessment Intact 12/14/2012  5:00 PM  Wound Length (cm) 7.8 cm 12/16/2012  3:21 PM  Wound Width (cm) 3.5 cm 12/16/2012  3:21 PM  Wound Depth (cm) 0 cm 12/16/2012  3:21 PM  Margins Unattacted edges (unapproximated) 12/19/2012  2:56 PM  Closure None 12/19/2012  2:56 PM  Drainage Amount Minimal 12/19/2012  2:56 PM  Drainage Description Serous 12/19/2012  2:56 PM  Treatment Cleansed;Debridement (Selective) 12/19/2012  2:56 PM  Dressing Type Compression wrap;ABD 12/19/2012  2:56 PM  Dressing Changed Changed 12/19/2012  2:56 PM  Dressing Status  Clean 12/19/2012  2:56 PM   Selective Debridement Selective Debridement - Location: Entire wound bed of all wounds Selective Debridement - Tools Used: Forceps;Scalpel Selective Debridement - Tissue Removed: slough and dead skin and perimeter   Physical Therapy Assessment and Plan PT Assessment and Plan Clinical Impression Statement: Wound continues to progress well. Granulation has increased since last session. Wounds are without signs or sx of infection. Pt  unable to stay for balance and strength training secondary to unexpected situation at home. PT Plan: Continue wound care and strength/balance training per PT POC.    Problem List Patient Active Problem List   Diagnosis Date Noted  . SAH (subarachnoid hemorrhage) 11/22/2012  . Seizures secondary to [facility] 11/22/2012  . Bilateral lower leg cellulitis 11/13/2012  . Open wound of leg 11/13/2012  . Oral thrush 11/13/2012    PT - End of Session Activity Tolerance: Patient tolerated treatment well General Behavior During Therapy: [facility] for tasks assessed/performed  Seth Bake, PTA 12/19/2012, 2:59 PM

## 2012-12-21 ENCOUNTER — Ambulatory Visit (HOSPITAL_COMMUNITY)
Admission: RE | Admit: 2012-12-21 | Discharge: 2012-12-21 | Disposition: A | Discharge status: Home / Self Care | Payer: Medicare Other | Source: Ambulatory Visit | Attending: Family Medicine | Admitting: Family Medicine

## 2012-12-21 NOTE — Progress Notes (Signed)
Physical Therapy Treatment Patient Details  Name: Bridget Ball MRN: [id number] Date of Birth: November 02, 1928  Today's Date: 12/21/2012 Time: 4098-1191 PT Time Calculation (min): 87 min  Visit#: 13 of 18  Re-eval: 01/15/13 Charges: Selective debridement (= or < 20 cm)(1520-1605) NMR x 25' (1610-1635)   Authorization: [employer] medicare   Authorization Visit#: 13 of 20   Subjective: Symptoms/Limitations Symptoms: Pt states that she has been doing some exercises at home. Pain Assessment Currently in Pain?: Yes Pain Score: 3  (After debridement ) Pain Location: Leg Pain Orientation: Right;Left;Lower  Wound Therapy Wound 11/13/12 Other (Comment) Right;Lateral;Lower (Active)  Site / Wound Assessment Pink;Yellow 12/21/2012  5:00 PM  % Wound base Red or Granulating 85% 12/21/2012  5:00 PM  % Wound base Yellow 15% 12/21/2012  5:00 PM  % Wound base Black 0% 12/21/2012  5:00 PM  % Wound base Other (Comment) 0% 12/21/2012  5:00 PM  Peri-wound Assessment Intact 12/21/2012  5:00 PM  Wound Length (cm) 4.2 cm 12/16/2012  3:21 PM  Wound Width (cm) 2 cm 12/16/2012  3:21 PM  Wound Depth (cm) 0 cm 12/16/2012  3:21 PM  Margins Unattacted edges (unapproximated) 12/21/2012  5:00 PM  Closure None 12/21/2012  5:00 PM  Drainage Amount Minimal 12/21/2012  5:00 PM  Drainage Description No odor;Serous 12/21/2012  5:00 PM  Non-staged Wound Description Full thickness 12/21/2012  5:00 PM  Treatment Cleansed;Debridement (Selective) 12/21/2012  5:00 PM  Dressing Type Compression wrap;ABD 12/21/2012  5:00 PM  Dressing Changed Changed 12/21/2012  5:00 PM  Dressing Status Clean;Dry;Intact 12/21/2012  5:00 PM     Wound 11/13/12 Other (Comment) Leg Right;Medial;Lower (Active)  Site / Wound Assessment Yellow;Red 12/21/2012  5:00 PM  % Wound base Red or Granulating 95% 12/21/2012  5:00 PM  % Wound base Yellow 5% 12/21/2012  5:00 PM  % Wound base Black 0% 12/21/2012  5:00 PM  % Wound base Other (Comment) 0%  12/21/2012  5:00 PM  Peri-wound Assessment Intact 12/21/2012  5:00 PM  Wound Length (cm) 1.5 cm 12/16/2012  3:21 PM  Wound Width (cm) 0.6 cm 12/16/2012  3:21 PM  Wound Depth (cm) 0 cm 12/16/2012  3:21 PM  Margins Unattacted edges (unapproximated) 12/21/2012  5:00 PM  Closure None 12/21/2012  5:00 PM  Drainage Amount Minimal 12/21/2012  5:00 PM  Drainage Description Serous 12/21/2012  5:00 PM  Non-staged Wound Description Partial thickness 12/21/2012  5:00 PM  Treatment Cleansed;Debridement (Selective) 12/21/2012  5:00 PM  Dressing Type Compression wrap;ABD 12/21/2012  5:00 PM  Dressing Changed Changed 12/21/2012  5:00 PM  Dressing Status Clean;Dry;Intact 12/21/2012  5:00 PM     Wound 11/13/12 Other (Comment) Leg Left;Lower (Active)  Site / Wound Assessment Red;Yellow 12/21/2012  5:00 PM  % Wound base Red or Granulating 55% 12/21/2012  5:00 PM  % Wound base Yellow 45% 12/21/2012  5:00 PM  % Wound base Black 0% 12/21/2012  5:00 PM  % Wound base Other (Comment) 0% 12/21/2012  5:00 PM  Peri-wound Assessment Intact 12/14/2012  5:00 PM  Wound Length (cm) 7.8 cm 12/16/2012  3:21 PM  Wound Width (cm) 3.5 cm 12/16/2012  3:21 PM  Wound Depth (cm) 0 cm 12/16/2012  3:21 PM  Margins Unattacted edges (unapproximated) 12/21/2012  5:00 PM  Closure None 12/21/2012  5:00 PM  Drainage Amount Minimal 12/21/2012  5:00 PM  Drainage Description Serous 12/21/2012  5:00 PM  Treatment Cleansed;Debridement (Selective) 12/21/2012  5:00 PM  Dressing Type Compression wrap;ABD 12/21/2012  5:00 PM  Dressing Changed Changed 12/21/2012  5:00 PM  Dressing Status Clean;Dry;Intact 12/21/2012  5:00 PM   Selective Debridement Selective Debridement - Location: Entire wound bed of all wounds Selective Debridement - Tools Used: Forceps;Scalpel Selective Debridement - Tissue Removed: slough and dead skin and perimeter   Exercise/Treatments Balance Exercises Standing Tandem Gait: 2 reps Retro Gait: 2 reps Heel  Raises: 10 reps;Limitations Heel Raises Limitations: Without UE assistance Toe Raise: 10 reps Other Standing Exercises: NuStep x 5'' hills # 3 L3 to improve strength and activity tolerance (Pt only able to tolerate 5' before fatigue set in)   Physical Therapy Assessment and Plan PT Assessment and Plan Clinical Impression Statement: Pt displays improve tolerance for debridement this session. Wounds continue to approximate well and are without signs or sx of infection. Pt displays improve stability with heel raise and toe raise. Pt requires frequent VC's to relax shoulders and focus when completing NMR activities. PT Plan: Continue wound care and strength/balance training per PT POC.    Problem List Patient Active Problem List   Diagnosis Date Noted  . SAH (subarachnoid hemorrhage) 11/22/2012  . Seizures secondary to [facility] 11/22/2012  . Bilateral lower leg cellulitis 11/13/2012  . Open wound of leg 11/13/2012  . Oral thrush 11/13/2012    PT - End of Session Activity Tolerance: Patient tolerated treatment well General Behavior During Therapy: [facility] for tasks assessed/performed  Seth Bake, PTA 12/21/2012, 5:17 PM

## 2012-12-23 ENCOUNTER — Ambulatory Visit (HOSPITAL_COMMUNITY)
Admission: RE | Admit: 2012-12-23 | Discharge: 2012-12-23 | Disposition: A | Discharge status: Home / Self Care | Payer: Medicare Other | Source: Ambulatory Visit

## 2012-12-23 ENCOUNTER — Ambulatory Visit (HOSPITAL_COMMUNITY)
Admission: RE | Admit: 2012-12-23 | Discharge: 2012-12-23 | Disposition: A | Discharge status: Home / Self Care | Payer: Medicare Other | Source: Ambulatory Visit | Attending: Family Medicine | Admitting: Family Medicine

## 2012-12-23 NOTE — Progress Notes (Signed)
Physical Therapy Treatment Patient Details  Name: Bridget Ball MRN: [id number] Date of Birth: [birth year]  Today's Date: 12/23/2012 Time: 4098-1191 PT Time Calculation (min): 94 min Charge; Selective debridement <20cm 1521-1622, NMR 1622-1645, TE 4782-9562  Visit#: 14 of 18  Re-eval: 01/15/13    Authorization: [employer] medicare  Authorization Time Period:    Authorization Visit#: 14 of 20   Subjective: Symptoms/Limitations Symptoms: Pt stated she is pain free currently, reports compression is a little tight.  Completed her HEP prior session today. Pain Assessment Currently in Pain?: No/denies  Objective:  Wound care treatment   12/23/12 1856  Subjective Assessment  Subjective Pt stated she is pain free currently, reports compression is a little tight.  Completed her HEP prior session today.  Wound  Date First Assessed/Time First Assessed: 11/13/12 0420   Wound Type: Other (Comment)  Location Orientation: Right;Lateral;Lower  Present on Admission: Yes  Site / Wound Assessment Pink;Yellow  % Wound base Red or Granulating 90%  % Wound base Yellow 10%  % Wound base Black 0%  % Wound base Other (Comment) 0%  Peri-wound Assessment Intact  Margins Unattacted edges (unapproximated)  Closure None  Drainage Amount Minimal  Drainage Description No odor;Serous  Non-staged Wound Description Full thickness  Treatment Cleansed;Debridement (Selective)  Dressing Type Compression wrap;ABD;Other (Comment) ([email or web address])  Dressing Changed Changed  Dressing Status Clean;Dry;Intact  Wound  Date First Assessed/Time First Assessed: 11/13/12 0420   Wound Type: Other (Comment)  Location: Leg  Location Orientation: Right;Medial;Lower  Present on Admission: Yes  Site / Wound Assessment Yellow;Red  % Wound base Red or Granulating 95%  % Wound base Yellow 5%  % Wound base Black 0%  % Wound base Other (Comment) 0%  Peri-wound Assessment Intact  Margins Unattacted edges (unapproximated)  Closure  None  Drainage Amount Minimal  Drainage Description Serous  Non-staged Wound Description Partial thickness  Treatment Cleansed;Debridement (Selective)  Dressing Type Compression wrap;ABD;Other (Comment) ([email or web address])  Dressing Changed Changed  Dressing Status Clean;Dry;Intact  Wound  Date First Assessed/Time First Assessed: 11/13/12 0425   Wound Type: Other (Comment)  Location: Leg  Location Orientation: Left;Lower  Present on Admission: Yes  Site / Wound Assessment Red;Yellow  % Wound base Red or Granulating 55%  % Wound base Yellow 45%  % Wound base Black 0%  % Wound base Other (Comment) 0%  Margins Unattacted edges (unapproximated)  Closure None  Drainage Amount Minimal  Drainage Description Serous  Treatment Cleansed;Debridement (Selective)  Dressing Type Compression wrap;ABD;Other (Comment) ([email or web address])  Dressing Changed Changed  Dressing Status Clean;Dry;Intact  Selective Debridement  Selective Debridement - Location Entire wound bed of all wounds  Selective Debridement - Tools Used Forceps;Scalpel  Selective Debridement - Tissue Removed slough and dead skin and perimeter     Exercise/Treatments Aerobic Stationary Bike: Nustep hilll level 3, resistance level 3 SPM average 91  Balance Exercises Standing Tandem Gait: 2 reps Retro Gait: 2 reps Other Standing Exercises: Heel and toe walking 1RT with min A   Physical Therapy Assessment and Plan PT Assessment and Plan Clinical Impression Statement: Pt with increased tolerance with debridement this session allowing debrdiemetn to proximal incision point on Lt LE, able to reduce yellow slough and increase granulation and approximation following debrdiement.  Pt reported increase in pain to 4/10 initially following wound care but stated pain free at end of session following balance training and strengthening exercises.  Progressed to heel and toe walking to improve ankle strengthening and balance with min assistance  Pt does  continue  to require cueing to improve posture and relax shoulders during activities.   PT Plan: Continue wound care and strength/balance training per PT POC.    Goals    Problem List Patient Active Problem List   Diagnosis Date Noted  . SAH (subarachnoid hemorrhage) 11/22/2012  . Seizures secondary to [facility] 11/22/2012  . Bilateral lower leg cellulitis 11/13/2012  . Open wound of leg 11/13/2012  . Oral thrush 11/13/2012    PT - End of Session Equipment Utilized During Treatment: Gait belt Activity Tolerance: Patient tolerated treatment well General Behavior During Therapy: [facility] for tasks assessed/performed  GP    Juel Burrow 12/23/2012, 7:08 PM

## 2012-12-26 ENCOUNTER — Ambulatory Visit (HOSPITAL_COMMUNITY)
Admission: RE | Admit: 2012-12-26 | Discharge: 2012-12-26 | Disposition: A | Discharge status: Home / Self Care | Payer: Medicare Other | Source: Ambulatory Visit | Attending: Family Medicine | Admitting: Family Medicine

## 2012-12-26 NOTE — Progress Notes (Signed)
Physical Therapy Treatment Patient Details  Name: Bridget Ball MRN: [id number] Date of Birth: [birth year]  Today's Date: 12/26/2012 Time: 1525-1650 PT Time Calculation (min): 85 min Deb < 20cm 1525-1605 TE: 1610-1650 Visit#: 15 of 18  Re-eval: 01/15/13    Authorization: [employer] medicare  Authorization Time Period:    Authorization Visit#: 15 of 20   Subjective: Symptoms/Limitations Symptoms: "How am I suppose to remember all of these exercises?" Pain Assessment Currently in Pain?: No/denies  Precautions/Restrictions     Exercise/Treatments Wound Therapy Wound 11/13/12 Other (Comment) Right;Lateral;Lower (Active)  Site / Wound Assessment Pink;Yellow 12/26/2012  4:00 PM  % Wound base Red or Granulating 90% 12/26/2012  4:00 PM  % Wound base Yellow 10% 12/26/2012  4:00 PM  % Wound base Black 0% 12/26/2012  4:00 PM  % Wound base Other (Comment) 0% 12/26/2012  4:00 PM  Peri-wound Assessment Intact 12/26/2012  4:00 PM  Wound Length (cm) 4.2 cm 12/16/2012  3:21 PM  Wound Width (cm) 2 cm 12/16/2012  3:21 PM  Wound Depth (cm) 0 cm 12/16/2012  3:21 PM  Margins Unattacted edges (unapproximated) 12/26/2012  4:00 PM  Closure None 12/26/2012  4:00 PM  Drainage Amount Minimal 12/26/2012  4:00 PM  Drainage Description No odor;Serous 12/26/2012  4:00 PM  Non-staged Wound Description Full thickness 12/26/2012  4:00 PM  Treatment Cleansed;Debridement (Selective) 12/26/2012  4:00 PM  Dressing Type Compression wrap;ABD;Other (Comment) 12/26/2012  4:00 PM  Dressing Changed Changed 12/26/2012  4:00 PM  Dressing Status Clean;Dry 12/26/2012  4:00 PM     Wound 11/13/12 Other (Comment) Leg Right;Medial;Lower (Active)  Site / Wound Assessment Yellow;Red 12/26/2012  4:00 PM  % Wound base Red or Granulating 95% 12/26/2012  4:00 PM  % Wound base Yellow 5% 12/26/2012  4:00 PM  % Wound base Black 0% 12/26/2012  4:00 PM  % Wound base Other (Comment) 0% 12/26/2012  4:00 PM  Peri-wound Assessment  Intact 12/26/2012  4:00 PM  Wound Length (cm) 1.5 cm 12/16/2012  3:21 PM  Wound Width (cm) 0.6 cm 12/16/2012  3:21 PM  Wound Depth (cm) 0 cm 12/16/2012  3:21 PM  Margins Unattacted edges (unapproximated) 12/26/2012  4:00 PM  Closure None 12/26/2012  4:00 PM  Drainage Amount Minimal 12/26/2012  4:00 PM  Drainage Description Serous 12/26/2012  4:00 PM  Non-staged Wound Description Partial thickness 12/26/2012  4:00 PM  Treatment Cleansed;Debridement (Selective) 12/26/2012  4:00 PM  Dressing Type Compression wrap;ABD;Other (Comment) 12/26/2012  4:00 PM  Dressing Changed Changed 12/26/2012  4:00 PM  Dressing Status Clean;Dry;Intact 12/26/2012  4:00 PM     Wound 11/13/12 Other (Comment) Leg Left;Lower (Active)  Site / Wound Assessment Red;Yellow 12/26/2012  4:00 PM  % Wound base Red or Granulating 60% 12/26/2012  4:00 PM  % Wound base Yellow 40% 12/26/2012  4:00 PM  % Wound base Black 0% 12/26/2012  4:00 PM  % Wound base Other (Comment) 0% 12/26/2012  4:00 PM  Peri-wound Assessment Intact 12/14/2012  5:00 PM  Wound Length (cm) 7.8 cm 12/16/2012  3:21 PM  Wound Width (cm) 3.5 cm 12/16/2012  3:21 PM  Wound Depth (cm) 0 cm 12/16/2012  3:21 PM  Margins Unattacted edges (unapproximated) 12/26/2012  4:00 PM  Closure None 12/26/2012  4:00 PM  Drainage Amount Minimal 12/26/2012  4:00 PM  Drainage Description Serous 12/26/2012  4:00 PM  Treatment Cleansed;Debridement (Selective) 12/26/2012  4:00 PM  Dressing Type Compression wrap;ABD;Other (Comment) 12/23/2012  6:56 PM  Dressing Changed Changed 12/26/2012  4:00 PM  Dressing Status Clean;Dry;Intact 12/26/2012  4:00 PM   Selective Debridement  Selective Debridement - Location  Entire wound bed of all wounds  Selective Debridement - Tools Used  Forceps;Scalpel  Selective Debridement - Tissue Removed  slough and dead skin and perimeter   Standing Heel Raises: 15 reps Knee Flexion: Both;15 reps;Limitations Knee Flexion Limitations:  3# Seated Long Arc Quad: 1 set;15 reps;Weights Long Arc Quad Weight: 3 lbs. Other Seated Knee Exercises: Sit to stand x5 reps Supine Bridges: 15 reps;Limitations Bridges Limitations: 3 sec holds Straight Leg Raises: Both;20 reps Straight Leg Raises Limitations: Bil LE Sidelying Hip ABduction: Both;15 reps (PT facilation for proper posture) Hip ADduction: Both;15 reps  Physical Therapy Assessment and Plan PT Assessment and Plan Clinical Impression Statement: Wound care provided by Seth Bake, PTA.  Wounds continue to progress well with honey gel and compression.  LE strengthening by Annett Fabian, MPT, ATC.  Continued to focus on her HEP and provided handout that was given to her at evaluation.  Asked pt to count out loud for all exercises to improve adherence with HEP.   PT Plan: Continue wound care and strength/balance training per PT POC.    Goals    Problem List Patient Active Problem List   Diagnosis Date Noted  . SAH (subarachnoid hemorrhage) 11/22/2012  . Seizures secondary to [facility] 11/22/2012  . Bilateral lower leg cellulitis 11/13/2012  . Open wound of leg 11/13/2012  . Oral thrush 11/13/2012    PT - End of Session Equipment Utilized During Treatment: Gait belt Activity Tolerance: Patient tolerated treatment well General Behavior During Therapy: [facility] for tasks assessed/performed  GP    Ann-Marie Kluge, MPT, ATC 12/26/2012, 5:40 PM

## 2012-12-28 ENCOUNTER — Ambulatory Visit (HOSPITAL_COMMUNITY)
Admission: RE | Admit: 2012-12-28 | Discharge: 2012-12-28 | Disposition: A | Discharge status: Home / Self Care | Payer: Medicare Other | Source: Ambulatory Visit | Attending: Family Medicine | Admitting: Family Medicine

## 2012-12-28 NOTE — Progress Notes (Signed)
Physical Therapy Treatment Patient Details  Name: Bridget Ball MRN: [id number] Date of Birth: Jan 29, 1928  Today's Date: 12/28/2012 Time: 1100-1155 PT Time Calculation (min): 55 min  Visit#: 16 of 18  Re-eval: 01/15/13 Authorization: [employer] medicare  Authorization Visit#: 16 of 20  Charges:  Deb<20cm  Subjective: Symptoms/Limitations Symptoms: Pt states her legs are feeling better.  Currently wtihout pain, states she only has it when we debride on them. Pain Assessment Currently in Pain?: No/denies     Wound Therapy  Wound 11/13/12 Other (Comment) Right;Lateral;Lower (Active)   Site / Wound Assessment  Pink;Yellow  12/26/2012 4:00 PM   % Wound base Red or Granulating  100% after debridement 12/26/2012 4:00 PM   % Wound base Yellow  0%  12/26/2012 4:00 PM   % Wound base Black  0%  12/26/2012 4:00 PM   % Wound base Other (Comment)  0%  12/26/2012 4:00 PM   Peri-wound Assessment  Intact  12/26/2012 4:00 PM   Wound Length (cm)  4.2 cm  12/16/2012 3:21 PM   Wound Width (cm)  2 cm  12/16/2012 3:21 PM   Wound Depth (cm)  0 cm  12/16/2012 3:21 PM   Margins  Unattacted edges (unapproximated)  12/26/2012 4:00 PM   Closure  None  12/26/2012 4:00 PM   Drainage Amount  Minimal  12/26/2012 4:00 PM   Drainage Description  No odor;Serous  12/26/2012 4:00 PM   Non-staged Wound Description  Not stageable, not a pressure wound 12/26/2012 4:00 PM   Treatment  Cleansed;Debridement (Selective)  12/26/2012 4:00 PM   Dressing Type  medihoney gel on gauze; compression wrap;ABD;Other (Comment)  12/26/2012 4:00 PM   Dressing Changed  Changed  12/26/2012 4:00 PM   Dressing Status  Clean;Dry  12/26/2012 4:00 PM     Wound 11/13/12 Other (Comment) Leg Right;Medial;Lower (Active)   Site / Wound Assessment  Yellow;Red  12/26/2012 4:00 PM   % Wound base Red or Granulating  100%  12/26/2012 4:00 PM   % Wound base Yellow  0%  12/26/2012 4:00 PM   % Wound base Black  0%  12/26/2012 4:00 PM   % Wound base  Other (Comment)  0%  12/26/2012 4:00 PM   Peri-wound Assessment  Intact  12/26/2012 4:00 PM   Wound Length (cm)  1.5 cm  12/16/2012 3:21 PM   Wound Width (cm)  0.6 cm  12/16/2012 3:21 PM   Wound Depth (cm)  0 cm  12/16/2012 3:21 PM   Margins  Unattacted edges (unapproximated)  12/26/2012 4:00 PM   Closure  None  12/26/2012 4:00 PM   Drainage Amount  Minimal  12/26/2012 4:00 PM   Drainage Description  Serous  12/26/2012 4:00 PM   Non-staged Wound Description  n/a 12/26/2012 4:00 PM   Treatment  Cleansed;Debridement (Selective)  12/26/2012 4:00 PM   Dressing Type  medihoney gel on gauze, compression wrap;ABD;Other (Comment)  12/26/2012 4:00 PM   Dressing Changed  Changed  12/26/2012 4:00 PM   Dressing Status  Clean;Dry;Intact  12/26/2012 4:00 PM     Wound 11/13/12 Other (Comment) Leg Left;Lower (Active)   Site / Wound Assessment  Red;Yellow  12/26/2012 4:00 PM   % Wound base Red or Granulating  70%  12/26/2012 4:00 PM   % Wound base Yellow  30%  12/26/2012 4:00 PM   % Wound base Black  0%  12/26/2012 4:00 PM   % Wound base Other (Comment)  0%  12/26/2012 4:00 PM   Peri-wound Assessment  Intact  12/14/2012 5:00 PM   Wound Length (cm)  7.8 cm  12/16/2012 3:21 PM   Wound Width (cm)  3.5 cm  12/16/2012 3:21 PM   Wound Depth (cm)  0 cm  12/16/2012 3:21 PM   Margins  Unattacted edges (unapproximated)  12/26/2012 4:00 PM   Closure  None  12/26/2012 4:00 PM   Drainage Amount  Minimal  12/26/2012 4:00 PM   Drainage Description  Serous  12/26/2012 4:00 PM   Treatment  Cleansed;Debridement (Selective)  12/26/2012 4:00 PM   Dressing Type  medihoney gel on gauze, Compression wrap;ABD;Other (Comment)  12/23/2012 6:56 PM   Dressing Changed  Changed  12/26/2012 4:00 PM   Dressing Status  Clean;Dry;Intact  12/26/2012 4:00 PM     Selective Debridement  Selective Debridement -  Location:Entire wound bed of all wounds  Tools Used: Forceps;Scalpel  Tissue Removed: slough and dead skin       Physical Therapy Assessment and Plan PT Assessment and Plan Clinical Impression Statement: Slough easily removed today from all wounds.  Following debridement, Rt LE wounds 100% granulated and Lt LE wound 70% granulated.  continued previous dressings of medihoney gel, 4X4's, kling and coban.  Exercises not completed today due to scheduling; pt instructed to complete at home today. PT Plan: Continue wound care and strength/balance training per PT POC.  Re-evaluate X 2 more visits.     Problem List Patient Active Problem List   Diagnosis Date Noted  . SAH (subarachnoid hemorrhage) 11/22/2012  . Seizures secondary to [facility] 11/22/2012  . Bilateral lower leg cellulitis 11/13/2012  . Open wound of leg 11/13/2012  . Oral thrush 11/13/2012    PT - End of Session Equipment Utilized During Treatment: Gait belt Activity Tolerance: Patient tolerated treatment well General Behavior During Therapy: [facility] for tasks assessed/performed   Lurena Nida, PTA/CLT 12/28/2012, 12:16 PM

## 2012-12-30 ENCOUNTER — Ambulatory Visit (HOSPITAL_COMMUNITY)
Admission: RE | Admit: 2012-12-30 | Discharge: 2012-12-30 | Disposition: A | Discharge status: Home / Self Care | Payer: Medicare Other | Source: Ambulatory Visit

## 2012-12-30 NOTE — Progress Notes (Signed)
Physical Therapy - Wound Therapy  Treatment   Patient Details  Name: Bridget Ball MRN: [id number] Date of Birth: 07/18/1928  Today's Date: 12/30/2012 Time: 4098-1191 Time Calculation (min): 55 min Charge :selective debridement <20 cm  Visit#: 17 of 18  Re-eval: 01/15/13  Subjective Subjective Assessment Subjective: Pt stated she is feeling good, still nervous with debridement.  Pain Assessment Pain Assessment Pain Assessment: No/denies pain  Wound Therapy  12/30/12 1915  Subjective Assessment  Subjective Pt stated she is feeling good, still nervous with debridement.  Wound  Date First Assessed/Time First Assessed: 11/13/12 0420   Wound Type: Other (Comment)  Location Orientation: Right;Lateral;Lower  Present on Admission: Yes  Site / Wound Assessment Pink;Yellow  % Wound base Red or Granulating 90%  % Wound base Yellow 10%  % Wound base Black 0%  % Wound base Other (Comment) 0%  Peri-wound Assessment Intact  Margins Unattacted edges (unapproximated)  Closure None  Drainage Amount Minimal  Drainage Description No odor;Serous  Non-staged Wound Description Full thickness  Treatment Cleansed;Debridement (Selective)  Dressing Type Compression wrap;ABD;Other (Comment) (medihoney)  Dressing Changed Changed  Dressing Status Clean;Dry  Wound  Date First Assessed/Time First Assessed: 11/13/12 0420   Wound Type: Other (Comment)  Location: Leg  Location Orientation: Right;Medial;Lower  Present on Admission: Yes  Site / Wound Assessment Yellow;Red  % Wound base Red or Granulating 95%  % Wound base Yellow 5%  % Wound base Black 0%  % Wound base Other (Comment) 0%  Peri-wound Assessment Intact  Margins Unattacted edges (unapproximated)  Closure None  Drainage Amount Minimal  Drainage Description Serous  Non-staged Wound Description Partial thickness  Treatment Cleansed;Debridement (Selective)  Dressing Type Compression wrap;ABD;Other (Comment) (medihoney)  Dressing  Changed Changed  Dressing Status Clean;Dry;Intact  Wound  Date First Assessed/Time First Assessed: 11/13/12 0425   Wound Type: Other (Comment)  Location: Leg  Location Orientation: Left;Lower  Present on Admission: Yes  Site / Wound Assessment Red;Yellow  % Wound base Red or Granulating 65%  % Wound base Yellow 35%  % Wound base Black 0%  % Wound base Other (Comment) 0%  Margins Unattacted edges (unapproximated)  Closure None  Drainage Amount Minimal  Drainage Description Serous  Treatment Cleansed;Debridement (Selective)  Dressing Type Compression wrap;ABD;Other (Comment) (medihoney)  Dressing Changed Changed  Dressing Status Clean;Dry;Intact  Selective Debridement  Selective Debridement - Location Entire wound bed of all wounds  Selective Debridement - Tools Used Forceps;Scalpel  Selective Debridement - Tissue Removed slough and dead skin and perimeter      Selective Debridement Selective Debridement - Location: Entire wound bed of all wounds Selective Debridement - Tools Used: Forceps;Scalpel Selective Debridement - Tissue Removed: slough and dead skin and perimeter   Physical Therapy Assessment and Plan Wound Therapy - Assess/Plan/Recommendations Wound Therapy - Clinical Statement: Wounds continue to improve in granualtion and decrease in overall size.  Less debridement required for Bil LE.  Wounds responding well to medihoney and compression.   Wound Plan: Re-eval next session.  continue with debridement and balance activities.      Goals    Problem List Patient Active Problem List   Diagnosis Date Noted  . SAH (subarachnoid hemorrhage) 11/22/2012  . Seizures secondary to [facility] 11/22/2012  . Bilateral lower leg cellulitis 11/13/2012  . Open wound of leg 11/13/2012  . Oral thrush 11/13/2012    GP    Juel Burrow 12/30/2012, 7:20 PM

## 2013-01-02 ENCOUNTER — Ambulatory Visit (HOSPITAL_COMMUNITY)
Admission: RE | Admit: 2013-01-02 | Discharge: 2013-01-02 | Disposition: A | Discharge status: Home / Self Care | Payer: Medicare Other | Source: Ambulatory Visit | Attending: Family Medicine | Admitting: Family Medicine

## 2013-01-02 NOTE — Progress Notes (Signed)
Physical Therapy - Wound Therapy  Treatment   Patient Details  Name: Bridget Ball MRN: [id number] Date of Birth: 1928-09-04  Today's Date: 01/02/2013 Time: 4098-1191 Time Calculation (min): 30 min Visit#: 18 of 18  Re-eval: 01/15/13 Charges:  Deb <20cm  Subjective Subjective Assessment Subjective: daughter states wrap kept sliding down and was scared it would make a new wound; appt scheduled tomorrow but came in today.  Pain Assessment Only having pain with debridement Wound Therapy Wound measurements are compared to when taken on 12/16/2012.  Wound 11/13/12 Other (Comment) Right;Lateral;Lower (Active)   Rt LE wounds addressed by Donnamae Jude, PT - medial wound basically healed with a small scab 1.5 cm x .3 cm; lateral wound 95% granulation was 85% wound approximate size is now 4 xm x 1.5 was 4.2 x 2.0 cm.  Wound 11/13/12 Other (Comment) Leg Left;Lower (Active)  Site / Wound Assessment Red;Yellow 01/02/2013  4:00 PM  % Wound base Red or Granulating 65% was 40% 01/02/2013  4:00 PM  % Wound base Yellow 35% was 60% 01/02/2013  4:00 PM  % Wound base Black 0% 01/02/2013  4:00 PM  % Wound base Other (Comment) 0% 01/02/2013  4:00 PM  Peri-wound Assessment Intact 12/14/2012  5:00 PM  Wound Length (cm) 7.8 cm was 7.8 12/16/2012  3:21 PM  Wound Width (cm) 3.5 cm was 3.5  12/16/2012  3:21 PM  Wound Depth (cm) 0 cm 12/16/2012  3:21 PM  Margins Unattacted edges (unapproximated) 01/02/2013  4:00 PM  Closure None 01/02/2013  4:00 PM  Drainage Amount Moderate 01/02/2013  4:00 PM  Drainage Description Serous 01/02/2013  4:00 PM  Non-staged Wound Description Not applicable 01/02/2013  4:00 PM  Treatment Cleansed;Debridement (Selective) 01/02/2013  4:00 PM  Dressing Type Compression wrap;ABD;Other (Comment) 01/02/2013  4:00 PM  Dressing Changed Changed 01/02/2013  4:00 PM  Dressing Status Clean;Dry;Intact 01/02/2013  4:00 PM   Selective Debridement Selective Debridement - Location:  Entire wound bed of all wounds Selective Debridement - Tools Used: Forceps;Scalpel Selective Debridement - Tissue Removed: slough and dead skin and perimeter   Physical Therapy Assessment and Plan Wound Therapy - Assess/Plan/Recommendations Assessment:  Rt LE debrided and dressed by Donnamae Jude, PT.   Lt LE wound continues to drain but decreasing in size and slough.  Debrided perimeter with scapel, irrigated and redressed with medihoney gel on gauze, 4X4, ABD pad, kerlix and coban for compression.  Wound hypergranulated at distal portion. Wound Plan: Continue with wound care 3x week x 4 more weeks or until healed.   Your signature is required to indicate approval of the treatment plan as stated above.  Please sign and return making a copy for your files.  You may hard copy or send electronically.  Please check one: ___1.  Approve of this plan  ___2.  Approve of this plan with the following changes.   ____________________________                             _____________ Physician                                                                      Date     Problem List  Patient Active Problem List   Diagnosis Date Noted  . SAH (subarachnoid hemorrhage) 11/22/2012  . Seizures secondary to [facility] 11/22/2012  . Bilateral lower leg cellulitis 11/13/2012  . Open wound of leg 11/13/2012  . Oral thrush 11/13/2012     Lurena Nida, PTA/CLT/Cindy Perlie Gold PT 01/02/2013, 4:16 PM

## 2013-01-03 ENCOUNTER — Ambulatory Visit (HOSPITAL_COMMUNITY)
Admission: RE | Admit: 2013-01-03 | Discharge: 2013-01-03 | Disposition: A | Discharge status: Home / Self Care | Payer: Medicare Other | Source: Ambulatory Visit | Attending: Family Medicine | Admitting: Family Medicine

## 2013-01-03 NOTE — Progress Notes (Addendum)
Physical Therapy Re-evaluation  Patient Details  Name: Bridget Ball MRN: [id number] Date of Birth: 11-03-1928  Today's Date: 01/03/2013 Time: 4098-1191 Time Calculation (min): 70 min            Visit#: 19 of 30  Re-eval: 01/15/13 Authorization: [employer] medicare    Authorization Visit#: 19 of 29   Physical Therapy - Wound Therapy  Evaluation   Wound Therapy Wound 11/13/12 Other (Comment) Right;Lateral;Lower (Active)  Site / Wound Assessment Pink;Yellow 01/03/2013  5:00 PM  % Wound base Red or Granulating 90% 01/03/2013  5:00 PM  % Wound base Yellow 10% 01/03/2013  5:00 PM  % Wound base Black 0% 01/03/2013  5:00 PM  % Wound base Other (Comment) 0% 01/03/2013  5:00 PM  Peri-wound Assessment Intact 01/03/2013  5:00 PM  Wound Length (cm) 4 cm (was 4.2X2X0 on 12/12) 01/03/2013  5:00 PM  Wound Width (cm) 1.2 cm  01/03/2013  5:00 PM  Wound Depth (cm) 0 cm 01/03/2013  5:00 PM  Margins Attached edges (approximated) 01/03/2013  5:00 PM  Closure None 01/03/2013  5:00 PM  Drainage Amount Minimal 01/03/2013  5:00 PM  Drainage Description No odor 01/03/2013  5:00 PM  Non-staged Wound Description Not applicable 01/03/2013  5:00 PM  Treatment Cleansed;Debridement (Selective) 01/03/2013  5:00 PM  Dressing Type Compression wrap;ABD;Other (Comment) medihoney 01/03/2013  5:00 PM  Dressing Changed Changed 01/03/2013  5:00 PM  Dressing Status Clean;Dry;Intact 01/03/2013  5:00 PM     Wound 11/13/12 Other (Comment) Leg Right;Medial;Lower (Active)  Site / Wound Assessment Pink 01/03/2013  5:00 PM  % Wound base Red or Granulating 100% 01/03/2013  5:00 PM  % Wound base Yellow 0%  01/03/2013  5:00 PM  % Wound base Black 0% 01/03/2013  5:00 PM  % Wound base Other (Comment) 0% 01/03/2013  5:00 PM  Peri-wound Assessment Intact 01/03/2013  5:00 PM  Wound Length (cm) 0 cm(was 1.5X0.6X0 on 12/12) 01/03/2013  5:00 PM  Wound Width (cm) 0 cm 01/03/2013  5:00 PM  Wound Depth (cm) 0 cm 01/03/2013  5:00 PM   Margins Attached edges (approximated) 01/03/2013  5:00 PM  Closure Approximated 01/03/2013  5:00 PM  Drainage Amount None 01/03/2013  5:00 PM  Drainage Description Serous 12/30/2012  7:15 PM  Non-staged Wound Description Not applicable 01/03/2013  5:00 PM  Treatment Other (Comment) 01/03/2013  5:00 PM  Dressing Type Compression wrap;ABD;Other (Comment) 12/30/2012  7:15 PM  Dressing Changed Changed 12/30/2012  7:15 PM  Dressing Status None 01/03/2013  5:00 PM     Wound 11/13/12 Other (Comment) Leg Left;Lower (Active)  Site / Wound Assessment Red;Yellow 01/03/2013  5:00 PM  % Wound base Red or Granulating 70% 01/03/2013  5:00 PM  % Wound base Yellow 30% 01/03/2013  5:00 PM  % Wound base Black 0% 01/03/2013  5:00 PM  % Wound base Other (Comment) 0% 01/03/2013  5:00 PM  Peri-wound Assessment Intact 12/14/2012  5:00 PM  Wound Length (cm) 7.5 cm (was 7.8X3.5X.4 on 12/12) 01/03/2013  5:00 PM  Wound Width (cm) 3 cm 01/03/2013  5:00 PM  Wound Depth (cm) 0.3 cm 01/03/2013  5:00 PM  Margins Attached edges (approximated) 01/03/2013  5:00 PM  Closure None 01/03/2013  5:00 PM  Drainage Amount Moderate 01/03/2013  5:00 PM  Drainage Description Serous 01/03/2013  5:00 PM  Non-staged Wound Description Not applicable 01/03/2013  5:00 PM  Treatment Cleansed;Debridement (Selective) 01/03/2013  5:00 PM  Dressing Type Compression wrap;ABD;Other (Comment) medihoney 01/03/2013  5:00 PM  Dressing Changed Changed 01/03/2013  5:00 PM  Dressing Status Clean;Intact;Dry 01/03/2013  5:00 PM   Selective Debridement Selective Debridement - Location: Entire wound bed of all wounds Selective Debridement - Tools Used: Forceps;Scalpel Selective Debridement - Tissue Removed: slough and dead skin and perimeter   Hip flex B 5/5  Hip Adduction B 5/5  Hip abduction Rt 5/5 Lt 4/5  Hip extension B 5/5  Knee ext B 5/5  Knee flexion B 5/5    Physical Therapy Assessment and Plan Wound Therapy -  Assess/Plan/Recommendations Wound Therapy - Clinical Statement: Wounds cointinue to heal with increasing granulation and decreasing size.  Rt medial wound is now healed an Rt lateral wound is nearly healed without depth and little slough remaining.  Lt LE wound is progressing well. Wound Plan: Continue woundcare to bilateral LE's with compression and balance therapy      GP Functional Assessment Tool Used: clinical judgement Functional Limitation: Other PT primary Other PT Primary Current Status (Z6109): At least 40 percent but less than 60 percent impaired, limited or restricted Other PT Primary Goal Status (U0454): At least 1 percent but less than 20 percent impaired, limited or restricted  Lurena Nida, PTA/CLT 01/03/2013, 5:25 PM  Your signature is required to indicate approval of the treatment plan as stated above.  Please sign and return making a copy for your files.  You may hard copy or send electronically.  Please check one: ___1.  Approve of this plan  ___2.  Approve of this plan with the following changes.   ____________________________                             _____________ Physician                                                                      Date

## 2013-01-05 NOTE — Progress Notes (Signed)
Agree with assessment and treatment plan.

## 2013-01-05 NOTE — Progress Notes (Signed)
Agree with assessment and treatment plan. 

## 2013-01-06 ENCOUNTER — Ambulatory Visit (HOSPITAL_COMMUNITY)
Admission: RE | Admit: 2013-01-06 | Discharge: 2013-01-06 | Disposition: A | Discharge status: Home / Self Care | Payer: Medicare Other | Source: Ambulatory Visit | Attending: Family Medicine | Admitting: Family Medicine

## 2013-01-06 DIAGNOSIS — S91009A Unspecified open wound, unspecified ankle, initial encounter: Secondary | ICD-10-CM

## 2013-01-06 DIAGNOSIS — S81809A Unspecified open wound, unspecified lower leg, initial encounter: Secondary | ICD-10-CM

## 2013-01-06 DIAGNOSIS — L02419 Cutaneous abscess of limb, unspecified: Secondary | ICD-10-CM | POA: Insufficient documentation

## 2013-01-06 DIAGNOSIS — IMO0001 Reserved for inherently not codable concepts without codable children: Secondary | ICD-10-CM | POA: Insufficient documentation

## 2013-01-06 DIAGNOSIS — S81009A Unspecified open wound, unspecified knee, initial encounter: Secondary | ICD-10-CM | POA: Insufficient documentation

## 2013-01-06 DIAGNOSIS — L03119 Cellulitis of unspecified part of limb: Secondary | ICD-10-CM

## 2013-01-06 NOTE — Progress Notes (Signed)
Physical Therapy - Wound Therapy  Treatment   Patient Details  Name: Bridget Ball MRN: [id number] Date of Birth: 05-14-1928  Today's Date: 01/06/2013 Time: 1345-1430 Time Calculation (min): 45 min Charge debridement 1350-1430 Visit#: 20 of 30  Re-eval: 01/15/13  Subjective Subjective Assessment Subjective: Pt states that her nerves are not doing well today. Patient and Family Stated Goals: full healing of both LEs Date of Onset: 05/14/12 Prior Treatments: antibiotics/IP wound care  Pain Assessment Pain Assessment Pain Assessment: No/denies pain  Wound Therapy Wound 11/13/12 Other (Comment) Right;Lateral;Lower (Active)  Site / Wound Assessment Red;Yellow 01/06/2013  4:13 PM  % Wound base Red or Granulating 95% 01/06/2013  4:13 PM  % Wound base Yellow 5% 01/06/2013  4:13 PM  % Wound base Black 0% 01/03/2013  5:00 PM  % Wound base Other (Comment) 0% 01/03/2013  5:00 PM  Peri-wound Assessment Intact 01/06/2013  4:13 PM  Wound Length (cm) 4 cm 01/03/2013  5:00 PM  Wound Width (cm) 1.2 cm 01/03/2013  5:00 PM  Wound Depth (cm) 0 cm 01/03/2013  5:00 PM  Margins Attached edges (approximated) 01/06/2013  4:13 PM  Closure None 01/06/2013  4:13 PM  Drainage Amount Minimal 01/06/2013  4:13 PM  Drainage Description No odor 01/06/2013  4:13 PM  Non-staged Wound Description Not applicable 06/11/3417  3:79 PM  Treatment Cleansed;Debridement (Selective) 01/06/2013  4:13 PM  Dressing Type Compression wrap;ABD;Other (Comment);[place] 01/06/2013  4:13 PM  Dressing Changed Changed 01/06/2013  4:13 PM  Dressing Status Clean;Dry 01/06/2013  4:13 PM     Wound 11/13/12 Other (Comment) Leg Right;Medial;Lower (Active)  Site / Wound Assessment Pink 01/03/2013  5:00 PM  % Wound base Red or Granulating 100% 01/03/2013  5:00 PM  % Wound base Yellow 0% 01/03/2013  5:00 PM  % Wound base Black 0% 01/03/2013  5:00 PM  % Wound base Other (Comment) 0% 01/03/2013  5:00 PM  Peri-wound Assessment Intact 01/03/2013  5:00 PM   Wound Length (cm) 0 cm 01/03/2013  5:00 PM  Wound Width (cm) 0 cm 01/03/2013  5:00 PM  Wound Depth (cm) 0 cm 01/03/2013  5:00 PM  Margins Attached edges (approximated) 01/03/2013  5:00 PM  Closure Approximated 01/03/2013  5:00 PM  Drainage Amount None 01/03/2013  5:00 PM  Drainage Description Serous 12/30/2012  7:15 PM  Non-staged Wound Description Not applicable 02/40/9735  3:29 PM  Treatment Other (Comment) 01/03/2013  5:00 PM  Dressing Type Compression wrap;ABD;Other (Comment) 12/30/2012  7:15 PM  Dressing Changed Changed 12/30/2012  7:15 PM  Dressing Status None 01/03/2013  5:00 PM     Wound 11/13/12 Other (Comment) Leg Left;Lower (Active)  Site / Wound Assessment Red;Yellow 01/06/2013  4:13 PM  % Wound base Red or Granulating 90% 01/06/2013  4:13 PM  % Wound base Yellow 10% 01/06/2013  4:13 PM  % Wound base Black 0% 01/06/2013  4:13 PM  % Wound base Other (Comment) 0% 01/06/2013  4:13 PM  Peri-wound Assessment Intact 01/06/2013  4:13 PM  Wound Length (cm) 7.5 cm 01/03/2013  5:00 PM  Wound Width (cm) 3 cm 01/03/2013  5:00 PM  Wound Depth (cm) 0.3 cm 01/03/2013  5:00 PM  Margins Attached edges (approximated) 01/06/2013  4:13 PM  Closure None 01/06/2013  4:13 PM  Drainage Amount Minimal 01/06/2013  4:13 PM  Drainage Description Serous 01/06/2013  4:13 PM  Non-staged Wound Description Not applicable 09/07/4266  3:41 PM  Treatment Cleansed;Debridement (Selective) 01/06/2013  4:13 PM  Dressing Type Compression wrap;ABD;Other (Comment)  01/06/2013  4:13 PM  Dressing Changed Changed 01/03/2013  5:00 PM  Dressing Status Clean;Intact;Dry 01/06/2013  4:13 PM   Selective Debridement Selective Debridement - Location: marbling throughout wound Selective Debridement - Tools Used: Forceps;Scalpel   Physical Therapy Assessment and Plan Wound Therapy - Assess/Plan/Recommendations Wound Therapy - Clinical Statement: Wounds have significant increased granulation even from last treatement anticipate Rt lateral wound to  be healed by next week.  Unable to work on balance secondary to time restraints. Factors Delaying/Impairing Wound Healing: Multiple medical problems Hydrotherapy Plan: Debridement;Dressing change Wound Therapy - Frequency: 3X / week Wound Therapy - Current Recommendations: PT Wound Plan: Continue woundcare to bilateral LE's with compression and balance therapy      Goals  progressing  Problem List Patient Active Problem List   Diagnosis Date Noted  . SAH (subarachnoid hemorrhage) 11/22/2012  . 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 Bilateral lower leg cellulitis 11/13/2012  . Open wound of leg 11/13/2012  . Oral thrush 11/13/2012    GP    RUSSELL,CINDY 01/06/2013, 4:20 PM

## 2013-01-09 ENCOUNTER — Other Ambulatory Visit (HOSPITAL_COMMUNITY): Payer: Self-pay | Admitting: Family Medicine

## 2013-01-09 ENCOUNTER — Ambulatory Visit (HOSPITAL_COMMUNITY)
Admission: RE | Admit: 2013-01-09 | Discharge: 2013-01-09 | Disposition: A | Discharge status: Home / Self Care | Payer: Medicare Other | Source: Ambulatory Visit | Attending: Family Medicine | Admitting: Family Medicine

## 2013-01-09 DIAGNOSIS — Z139 Encounter for screening, unspecified: Secondary | ICD-10-CM

## 2013-01-09 NOTE — Progress Notes (Signed)
Physical Therapy Treatment/Wound care Patient Details  Name: Bridget Ball MRN: [id number] Date of Birth: May 20, 1928  Today's Date: 01/09/2013 Time: 1022-1110 PT Time Calculation (min): 48 min Charge:Selective debridement <20cm 1022-1110, NMR 1112--1145, TE 7341-9379  Visit#: 21 of 30  Re-eval: 01/15/13    Authorization: [employer] medicare  Authorization Time Period:    Authorization Visit#: 21 of 29   Subjective: Symptoms/Limitations Symptoms: Pt statedd  pain free today, reports family members stating she has improved gait mechanics.   Pain Assessment Currently in Pain?: No/denies  Objective:   Exercise/Treatments Aerobic Stationary Bike: Nustep hilll level 3, resistance level 3 SPM average 82 Standing Rocker Board: 2 minutes;Other (comment) (R/L and A/P)  Balance Exercises Standing Balance Beam: 2RT tandem, retro and sidestepping Step Over Hurdles / Cones: 6 and 12in alternating 2RT Other Standing Exercises: nustep 10' hill level 3, resistance level 3 SPM 82, for LE strengthening      01/09/13 1500  Wound  Date First Assessed/Time First Assessed: 11/13/12 0420   Wound Type: Other (Comment)  Location Orientation: Right;Lateral;Lower  Present on Admission: Yes  Site / Wound Assessment Red;Yellow  % Wound base Red or Granulating 95%  % Wound base Yellow 5%  % Wound base Black 0%  Peri-wound Assessment Intact  Wound Length (cm) (Wx L .3x.4 with .8 fully healed and 1.0x 1.6)  Wound Width (cm) (Wx L .3x.4 with .8 fully healed and 1.0x 1.6)  Wound Depth (cm) 0 cm  Margins Attached edges (approximated)  Closure None  Drainage Amount Minimal  Drainage Description No odor  Non-staged Wound Description Not applicable  Treatment Cleansed;Debridement (Selective)  Dressing Type Gauze (Comment);Compression wrap (mediihoney)  Dressing Changed Changed  Dressing Status Clean;Dry  Wound  Date First Assessed/Time First Assessed: 11/13/12 0425   Wound Type: Other (Comment)   Location: Leg  Location Orientation: Left;Lower  Present on Admission: Yes  Site / Wound Assessment Red;Yellow  % Wound base Red or Granulating 90%  % Wound base Yellow 10%  % Wound base Black 0%  % Wound base Other (Comment) 0%  Peri-wound Assessment Intact  Margins Attached edges (approximated)  Closure None  Drainage Amount Minimal  Drainage Description Serous  Non-staged Wound Description Not applicable  Treatment Cleansed;Debridement (Selective)  Dressing Type Gauze (Comment);Compression wrap (medihoney)  Dressing Status Clean;Intact;Dry  Selective Debridement  Selective Debridement - Tools Used Forceps;Scalpel  Selective Debridement - Tissue Removed slough and dead skin and perimeter  Wound Therapy - Assess/Plan/Recommendations  Wound Therapy - Clinical Statement Significant improvements in overall granulation and decrease in overall wound size.  Rt lateral wound has now healed in middle for 0.8 cm with prximal wound at .3cm in width and .4 cm in length.  Distal part of wound is now at 1.0 in width and 1.6 cm in length.  Medial wound fully healed Rt LE.    Wound Plan Continue woundcare to bilateral LE's with compression and balance therapy    Physical Therapy Assessment and Plan PT Assessment and Plan Clinical Impression Statement: Significant improvements in overall granulation and decrease in overall wound size.  Rt lateral wound has now healed in middle for 0.8 cm with prximal wound at .3cm in width and .4 cm in length.  Distal part of wound is now at 1.0 in width and 1.6 cm in length.  Medial wound fully healed Rt LE.  Progressed balance activtieis to dynamic surfaces and added hurdles to improve knee flexion and hip flexion with gait to reduce shuffling.  Nustep for  strengthening.   PT Plan: Continue wound care and strength/balance training per PT POC.  Re-eval next session.    Goals    Problem List Patient Active Problem List   Diagnosis Date Noted  . SAH (subarachnoid  hemorrhage) 11/22/2012  . Seizures secondary to [facility] 11/22/2012  . Bilateral lower leg cellulitis 11/13/2012  . Open wound of leg 11/13/2012  . Oral thrush 11/13/2012    PT - End of Session Equipment Utilized During Treatment: Gait belt Activity Tolerance: Patient tolerated treatment well General Behavior During Therapy: [facility] for tasks assessed/performed  GP    Aldona Lento 01/09/2013, 4:25 PM

## 2013-01-11 ENCOUNTER — Ambulatory Visit (HOSPITAL_COMMUNITY)
Admission: RE | Admit: 2013-01-11 | Discharge: 2013-01-11 | Disposition: A | Discharge status: Home / Self Care | Payer: Medicare Other | Source: Ambulatory Visit | Attending: Family Medicine | Admitting: Family Medicine

## 2013-01-11 NOTE — Progress Notes (Signed)
Physical Therapy Treatment Patient Details  Name: Bridget Ball MRN: [id number] Date of Birth: Mar 12, 1928  Today's Date: 01/11/2013 Time: 1022-1200 PT Time Calculation (min): 98 min  Visit#: 22 of 30  Re-eval: 02/08/13 Authorization: [employer] medicare  Authorization Visit#: 16 of 29  Charges:  Deb <20cm, NMR 40'  Subjective: Symptoms/Limitations Symptoms: Pt states she is feeling good today; states her legs are not hurting just itching a little. Pain Assessment Currently in Pain?: No/denies   Exercise/Treatments Balance Exercises Standing SLS: 5 reps;Time SLS Time: R:15", L: 8" no UE's Balance Beam: 2RT tandem, retro and sidestepping Step Over Hurdles / Cones: 6 and 12in alternating 2RT Other Standing Exercises: rockerboard 2 mins each A/P, R/L Other Standing Exercises: nustep 10' hill level 3, resistance level 3 SPM 82, for activity tolerance at end of session   Physical Therapy - Wound Therapy  Treatment   Wound Therapy Wound 11/13/12 Other (Comment) Right;Lateral;Lower (Active)  Site / Wound Assessment Red;Yellow 01/11/2013 12:00 PM  % Wound base Red or Granulating 95% 01/11/2013 12:00 PM  % Wound base Yellow 5% 01/11/2013 12:00 PM  % Wound base Black 0% 01/11/2013 12:00 PM  % Wound base Other (Comment) 0% 01/03/2013  5:00 PM  Peri-wound Assessment Intact 01/11/2013 12:00 PM  Wound Length (cm) 4 cm 01/03/2013  5:00 PM  Wound Width (cm) 1.2 cm 01/03/2013  5:00 PM  Wound Depth (cm) 0 cm 01/09/2013  3:00 PM  Margins Attached edges (approximated) 01/11/2013 12:00 PM  Closure None 01/11/2013 12:00 PM  Drainage Amount Moderate 01/11/2013 12:00 PM  Drainage Description No odor 01/11/2013 12:00 PM  Non-staged Wound Description Not applicable 0/05/3974 73:41 PM  Treatment Cleansed;Debridement (Selective) 01/11/2013 12:00 PM  Dressing Type Impregnated gauze (bismuth);Compression wrap 01/11/2013 12:00 PM  Dressing Changed Changed 01/11/2013 12:00 PM  Dressing Status Clean;Dry;Intact 01/11/2013 12:00 PM     Wound 11/13/12 Other (Comment) Leg Right;Medial;Lower (Active)  Site / Wound Assessment Pink 01/03/2013  5:00 PM  % Wound base Red or Granulating 100% 01/03/2013  5:00 PM  % Wound base Yellow 0% 01/03/2013  5:00 PM  % Wound base Black 0% 01/03/2013  5:00 PM  % Wound base Other (Comment) 0% 01/03/2013  5:00 PM  Peri-wound Assessment Intact 01/03/2013  5:00 PM  Wound Length (cm) 0 cm 01/03/2013  5:00 PM  Wound Width (cm) 0 cm 01/03/2013  5:00 PM  Wound Depth (cm) 0 cm 01/03/2013  5:00 PM  Margins Attached edges (approximated) 01/03/2013  5:00 PM  Closure Approximated 01/03/2013  5:00 PM  Drainage Amount None 01/03/2013  5:00 PM  Drainage Description Serous 12/30/2012  7:15 PM  Non-staged Wound Description Not applicable 93/79/0240  9:73 PM  Treatment Other (Comment) 01/03/2013  5:00 PM  Dressing Type Compression wrap;ABD;Other (Comment) 12/30/2012  7:15 PM  Dressing Changed Changed 12/30/2012  7:15 PM  Dressing Status None 01/03/2013  5:00 PM     Wound 11/13/12 Other (Comment) Leg Left;Lower (Active)  Site / Wound Assessment Red;Yellow 01/11/2013 12:00 PM  % Wound base Red or Granulating 95% 01/11/2013 12:00 PM  % Wound base Yellow 5% 01/11/2013 12:00 PM  % Wound base Black 0% 01/11/2013 12:00 PM  % Wound base Other (Comment) 0% 01/11/2013 12:00 PM  Peri-wound Assessment Intact 01/11/2013 12:00 PM  Wound Length (cm) 7.5 cm 01/03/2013  5:00 PM  Wound Width (cm) 3 cm 01/03/2013  5:00 PM  Wound Depth (cm) 0.3 cm 01/03/2013  5:00 PM  Margins Attached edges (approximated) 01/11/2013 12:00 PM  Closure None  01/11/2013 12:00 PM  Drainage Amount Moderate 01/11/2013 12:00 PM  Drainage Description Serous 01/11/2013 12:00 PM  Non-staged Wound Description Not applicable 01/10/1759 60:73 PM  Treatment Cleansed;Debridement (Selective) 01/11/2013 12:00 PM  Dressing Type Impregnated gauze (bismuth);Compression wrap 01/11/2013 12:00 PM  Dressing Changed Changed 01/03/2013  5:00 PM  Dressing Status Clean;Dry;Intact  01/11/2013 12:00 PM   Selective Debridement Selective Debridement - Tools Used: Forceps;Scalpel Selective Debridement - Tissue Removed: slough and dead skin and perimeter   Physical Therapy Assessment and Plan   PT Assessment and Plan Clinical Impression Statement: bilateral LE's continue to increase in granulation and decrease in overall size.  Changed dressings to xeroform today to help promote wound closure as wounds with increased maceration today due to medihoney.  continued full compression for bilateral LE's using coban from base of MT's to patella.  Pt will continue to need compression garments after discharge.  Order to be faxed to MD ([place] per request).  Pt with improving balance with most difficulty  maintaining A/P control.  Continued ending with nustep to improve activity tolerance.  Pt required 4 rest breaks and able to keep RPM around 95.   PT Plan: Continue wound care and strength/balance training per PT POC.      Problem List Patient Active Problem List   Diagnosis Date Noted  . SAH (subarachnoid hemorrhage) 11/22/2012  . Seizures secondary to [facility] 11/22/2012  . Bilateral lower leg cellulitis 11/13/2012  . Open wound of leg 11/13/2012  . Oral thrush 11/13/2012     Teena Irani, PTA/CLT 01/11/2013, 12:19 PM

## 2013-01-13 ENCOUNTER — Ambulatory Visit (HOSPITAL_COMMUNITY): Payer: Medicare Other

## 2013-01-13 ENCOUNTER — Ambulatory Visit (HOSPITAL_COMMUNITY)
Admission: RE | Admit: 2013-01-13 | Discharge: 2013-01-13 | Disposition: A | Discharge status: Home / Self Care | Payer: Medicare Other | Source: Ambulatory Visit | Attending: Family Medicine | Admitting: Family Medicine

## 2013-01-13 NOTE — Progress Notes (Signed)
Physical Therapy Treatment Patient Details  Name: SHAHLA BETSILL MRN: [id number] Date of Birth: 06/09/1928  Today's Date: 01/13/2013 Time: 1022-1158 PT Time Calculation (min): 96 min Charge: Selective debridement <20 cm 1884-1660, NMR 6301-6010  Visit#: 23 of 30  Re-eval: 02/08/13    Authorization: [employer] medicare  Authorization Time Period:    Authorization Visit#: 23 of 29   Subjective: Symptoms/Limitations Symptoms: Reports pain free today, legs are itchy Pain Assessment Currently in Pain?: No/denies  Objective:  Exercise/Treatments Balance Exercises Standing Balance Master: Limits for Stability: Test Balance Master: Dynamic: Test Balance Beam: 2RT tandem, retro and sidestepping Step Over Hurdles / Cones: 6 and 12in alternating 2RT Heel Raises: Limitations Heel Raises Limitations: Heel walking Other Standing Exercises: rockerboard 2 mins each A/P, R/L    Physical Therapy - Wound Therapy  Treatment  Wound Therapy  Wound 11/13/12 Other (Comment) Right;Lateral;Lower (Active)   Site / Wound Assessment  Red;Yellow  01/13/2013 12:00 PM   % Wound base Red or Granulating  100% 01/13/2013 12:00 PM   % Wound base Yellow  0%  01/11/2013 12:00 PM   % Wound base Black  0%  01/11/2013 12:00 PM   % Wound base Other (Comment)  0%  01/03/2013 5:00 PM   Peri-wound Assessment  Intact  01/11/2013 12:00 PM   Wound Length (cm)  4 cm  01/03/2013 5:00 PM   Wound Width (cm)  1.2 cm  01/03/2013 5:00 PM   Wound Depth (cm)  0 cm  01/09/2013 3:00 PM   Margins  Attached edges (approximated)  01/11/2013 12:00 PM   Closure  None  01/11/2013 12:00 PM   Drainage Amount  Moderate  01/11/2013 12:00 PM   Drainage Description  No odor  01/11/2013 12:00 PM   Non-staged Wound Description  Not applicable  09/07/2353 73:22 PM   Treatment  Cleansed;Debridement (Selective)  01/11/2013 12:00 PM   Dressing Type  Impregnated gauze (bismuth);Compression wrap  01/11/2013 12:00 PM   Dressing Changed  Changed  01/11/2013 12:00 PM    Dressing Status  Clean;Dry;Intact  01/11/2013 12:00 PM     Wound 11/13/12 Other (Comment) Leg Right;Medial;Lower (Active)   Site / Wound Assessment  Pink  01/03/2013 5:00 PM   % Wound base Red or Granulating  100%  01/03/2013 5:00 PM   % Wound base Yellow  0%  01/03/2013 5:00 PM   % Wound base Black  0%  01/03/2013 5:00 PM   % Wound base Other (Comment)  0%  01/03/2013 5:00 PM   Peri-wound Assessment  Intact  01/03/2013 5:00 PM   Wound Length (cm)  0 cm  01/03/2013 5:00 PM   Wound Width (cm)  0 cm  01/03/2013 5:00 PM   Wound Depth (cm)  0 cm  01/03/2013 5:00 PM   Margins  Attached edges (approximated)  01/03/2013 5:00 PM   Closure  Approximated  01/03/2013 5:00 PM   Drainage Amount  None  01/03/2013 5:00 PM   Drainage Description  Serous  12/30/2012 7:15 PM   Non-staged Wound Description  Not applicable  02/54/2706 2:37 PM   Treatment  Other (Comment)  01/03/2013 5:00 PM   Dressing Type  Compression wrap;ABD;Other (Comment)  12/30/2012 7:15 PM   Dressing Changed  Changed  12/30/2012 7:15 PM   Dressing Status  None  01/03/2013 5:00 PM     Wound 11/13/12 Other (Comment) Leg Left;Lower (Active)   Site / Wound Assessment  Red;Yellow  01/11/2013 12:00 PM   % Wound base Red or  Granulating  95%  01/11/2013 12:00 PM   % Wound base Yellow  5%  01/11/2013 12:00 PM   % Wound base Black  0%  01/11/2013 12:00 PM   % Wound base Other (Comment)  0%  01/11/2013 12:00 PM   Peri-wound Assessment  Intact  01/11/2013 12:00 PM   Wound Length (cm)  7.5 cm  01/03/2013 5:00 PM   Wound Width (cm)  3 cm  01/03/2013 5:00 PM   Wound Depth (cm)  0.3 cm  01/03/2013 5:00 PM   Margins  Attached edges (approximated)  01/11/2013 12:00 PM   Closure  None  01/11/2013 12:00 PM   Drainage Amount  Moderate  01/11/2013 12:00 PM   Drainage Description  Serous  01/11/2013 12:00 PM   Non-staged Wound Description  Not applicable  6/0/7371 06:26 PM   Treatment  Cleansed;Debridement (Selective)  01/11/2013 12:00 PM   Dressing Type  Impregnated  gauze (bismuth);Compression wrap  01/11/2013 12:00 PM   Dressing Changed  Changed  01/03/2013 5:00 PM   Dressing Status  Clean;Dry;Intact  01/11/2013 12:00 PM   Selective Debridement  Selective Debridement - Tools Used: Forceps;Scalpel  Selective Debridement - Tissue Removed: slough and dead skin and perimeter   Physical Therapy Assessment and Plan PT Assessment and Plan Clinical Impression Statement: Wounds progressing well with change in dressing to xeroform.  Rt LE 100% granulation following debridement.  Continued with gauze and compressons Bil LE.  Pt toleraed well towards wound treatment today with no c/o pain.  Advanced dynamic balance activiteis, trial with balance master, believe too difficult for pt at this stage.  Pt with tendency to posterior lean during blance training this session requiring multimodal cueing to improve spatial awareness and finding CBOS with min assistance.  Completed dynamic balance activities to reduce lean. PT Plan: Continue wound care and strength/balance training per PT POC.     Goals    Problem List Patient Active Problem List   Diagnosis Date Noted  . SAH (subarachnoid hemorrhage) 11/22/2012  . Seizures secondary to [facility] 11/22/2012  . Bilateral lower leg cellulitis 11/13/2012  . Open wound of leg 11/13/2012  . Oral thrush 11/13/2012    PT - End of Session Equipment Utilized During Treatment: Gait belt Activity Tolerance: Patient tolerated treatment well General Behavior During Therapy: [facility] for tasks assessed/performed  GP    Aldona Lento 01/13/2013, 6:22 PM

## 2013-01-16 ENCOUNTER — Ambulatory Visit (HOSPITAL_COMMUNITY)
Admission: RE | Admit: 2013-01-16 | Discharge: 2013-01-16 | Disposition: A | Discharge status: Home / Self Care | Payer: Medicare Other | Source: Ambulatory Visit | Attending: Family Medicine | Admitting: Family Medicine

## 2013-01-16 NOTE — Progress Notes (Signed)
Physical Therapy - Wound Therapy/Treatment  Treatment   Patient Details  Name: Bridget Ball MRN: [id number] Date of Birth: Jun 03, 1928  Today's Date: 01/16/2013 Time: 8182-9937 Time Calculation (min): 87 min Charges:  Deb: < 20 cm NMR: 30 Visit#: 24 of 30  Re-eval: 02/08/13  Subjective Subjective Assessment Subjective: Pt reports that she was alone this weekend and did her leg raises for her exercises.    Pain Assessment Pain Assessment Pain Assessment: No/denies pain  Wound Therapy Wound 11/13/12 Other (Comment) Right;Lateral;Lower (Active)  Site / Wound Assessment Red;Yellow 01/16/2013  2:30 PM  % Wound base Red or Granulating 95% 01/16/2013  2:30 PM  % Wound base Yellow 5% 01/16/2013  2:30 PM  % Wound base Black 0% 01/16/2013  2:30 PM  % Wound base Other (Comment) 0% 01/03/2013  5:00 PM  Peri-wound Assessment Intact 01/16/2013  2:30 PM  Wound Length (cm) 4 cm 01/03/2013  5:00 PM  Wound Width (cm) 1.2 cm 01/03/2013  5:00 PM  Wound Depth (cm) 0 cm 01/09/2013  3:00 PM  Margins Attached edges (approximated) 01/16/2013  2:30 PM  Closure None 01/16/2013  2:30 PM  Drainage Amount Moderate 01/16/2013  2:30 PM  Drainage Description No odor 01/16/2013  2:30 PM  Non-staged Wound Description Not applicable 1/69/6789  3:81 PM  Treatment Cleansed;Debridement (Selective) 01/16/2013  2:30 PM  Dressing Type Impregnated gauze (bismuth);Compression wrap 01/16/2013  2:30 PM  Dressing Changed Changed 01/11/2013 12:00 PM  Dressing Status Clean;Dry;Intact 01/16/2013  2:30 PM     Wound 11/13/12 Other (Comment) Leg Right;Medial;Lower (Active)  Site / Wound Assessment Pink 01/03/2013  5:00 PM  % Wound base Red or Granulating 100% 01/03/2013  5:00 PM  % Wound base Yellow 0% 01/03/2013  5:00 PM  % Wound base Black 0% 01/03/2013  5:00 PM  % Wound base Other (Comment) 0% 01/03/2013  5:00 PM  Peri-wound Assessment Intact 01/03/2013  5:00 PM  Wound Length (cm) 0 cm 01/03/2013  5:00 PM  Wound Width (cm) 0 cm  01/03/2013  5:00 PM  Wound Depth (cm) 0 cm 01/03/2013  5:00 PM  Margins Attached edges (approximated) 01/03/2013  5:00 PM  Closure Approximated 01/03/2013  5:00 PM  Drainage Amount None 01/03/2013  5:00 PM  Drainage Description Serous 12/30/2012  7:15 PM  Non-staged Wound Description Not applicable 01/75/1025  8:52 PM  Treatment Other (Comment) 01/03/2013  5:00 PM  Dressing Type Compression wrap;ABD;Other (Comment) 12/30/2012  7:15 PM  Dressing Changed Changed 12/30/2012  7:15 PM  Dressing Status None 01/03/2013  5:00 PM     Wound 11/13/12 Other (Comment) Leg Left;Lower (Active)  Site / Wound Assessment Red;Yellow 01/16/2013  2:30 PM  % Wound base Red or Granulating 75% 01/16/2013  2:30 PM  % Wound base Yellow 25% 01/16/2013  2:30 PM  % Wound base Black 0% 01/16/2013  2:30 PM  % Wound base Other (Comment) 0% 01/16/2013  2:30 PM  Peri-wound Assessment Intact 01/16/2013  2:30 PM  Wound Length (cm) 7.5 cm 01/03/2013  5:00 PM  Wound Width (cm) 3 cm 01/03/2013  5:00 PM  Wound Depth (cm) 0.3 cm 01/03/2013  5:00 PM  Margins Attached edges (approximated) 01/16/2013  2:30 PM  Closure None 01/16/2013  2:30 PM  Drainage Amount Moderate 01/16/2013  2:30 PM  Drainage Description Serous 01/16/2013  2:30 PM  Non-staged Wound Description Not applicable 7/78/2423  5:36 PM  Treatment Cleansed;Debridement (Selective) 01/16/2013  2:30 PM  Dressing Type Impregnated gauze (bismuth);Compression wrap 01/16/2013  2:30 PM  Dressing Changed Changed 01/03/2013  5:00 PM  Dressing Status Clean;Dry;Intact 01/29/13  2:30 PM   Selective Debridement Selective Debridement - Location: marbling throughout wound Selective Debridement - Tools Used: Forceps;Scalpel Selective Debridement - Tissue Removed: cleansed skin with water and soap, slough and dead skin and perimeter    2013-01-29 1400  Balance Exercises: Standing  Standing Eyes Opened Narrow base of support (BOS);Head turns;Foam;Time  Standing Eyes Opened Time 3 min w/ 7  head turns each diection, min-75mod A w/o UE A  Standing, One Foot on a Step Eyes open;4 inch;2 reps;30 secs  Balance Master: Limits for Stability 3 minutes w and w/o UE A (w/mod A) w/max cueing for proper weight shifting.   Other Standing Exercises rockerboard static stance S<>S 3 min, A<>P 3 min min- mod a w/o UE A  Other Standing Exercises NuStep: 3:30 level 5 hills #2 (d/c due to difficulty breathing)    Physical Therapy Assessment and Plan Wound Therapy - Assess/Plan/Recommendations Wound Therapy - Clinical Statement: Pt continues to show improvement with LE wounds and has most pain on LLE wound at superior border.  Continued with compression to BLE.  During balance activities has significant leaning towards Rt posterior and requires max cueing for proper positioning and for balance.  Today pt is limited due to cognitive impairments.   Wound Plan: Continue woundcare to bilateral LE's with compression and balance therapy PT Assessment and Plan PT Plan: Continue wound care and strength/balance training per PT POC.     Goals    Problem List Patient Active Problem List   Diagnosis Date Noted  . SAH (subarachnoid hemorrhage) 11/22/2012  . Seizures secondary to [facility] 11/22/2012  . Bilateral lower leg cellulitis 11/13/2012  . Open wound of leg 11/13/2012  . Oral thrush 11/13/2012    GP    Sugey Trevathan, MPT, ATC 01/29/13, 2:34 PM

## 2013-01-17 ENCOUNTER — Ambulatory Visit (HOSPITAL_COMMUNITY)
Admission: RE | Admit: 2013-01-17 | Discharge: 2013-01-17 | Disposition: A | Discharge status: Home / Self Care | Payer: Medicare Other | Source: Ambulatory Visit | Attending: Family Medicine | Admitting: Family Medicine

## 2013-01-17 DIAGNOSIS — Z1231 Encounter for screening mammogram for malignant neoplasm of breast: Secondary | ICD-10-CM | POA: Insufficient documentation

## 2013-01-17 DIAGNOSIS — Z139 Encounter for screening, unspecified: Secondary | ICD-10-CM

## 2013-01-18 ENCOUNTER — Ambulatory Visit (HOSPITAL_COMMUNITY)
Admission: RE | Admit: 2013-01-18 | Discharge: 2013-01-18 | Disposition: A | Discharge status: Home / Self Care | Payer: Medicare Other | Source: Ambulatory Visit | Attending: Family Medicine | Admitting: Family Medicine

## 2013-01-18 NOTE — Progress Notes (Signed)
Physical Therapy Treatment Patient Details  Name: Bridget Ball MRN: [id number] Date of Birth: Mar 27, 1928  Today's Date: 01/18/2013 Time: 1300-1430 PT Time Calculation (min): 15 min Charge:  Debridement [id number]; neuro re ed 1443-1540; there ex 1420-1430 Visit#: 25 of 30  Re-eval: 02/08/13    Authorization: [employer] medicare  Authorization Visit#: 25 of 30   Subjective: Symptoms/Limitations Symptoms: Pt's daughter reports she feels her mom's balance is still not doing as well as it should be.    Exercise/Treatments     Balance Exercises Standing Standing Eyes Closed: Narrow base of support (BOS);Head turns;Foam;5 reps Tandem Stance: Eyes closed;Foam;2 reps Standing, One Foot on a Step: Eyes closed;8 inch;2 reps Wall Bumps: Shoulder;10 reps Balance Beam: 2 RT forward and backward Tandem Gait: Limitations Cone Rotation: Foam;Right turn;Left turn Toe Raise: Limitations Toe Raise Limitations: toe walking 1 RT Other Standing Exercises: rockerboard Rt/Lt and A/P x 2 min Other Standing Exercises: Nustep L 4 x 10:0    Wound Therapy  Wound 11/13/12 Other (Comment) Right;Lateral;Lower (Active)   Site / Wound Assessment  Red;Yellow  01/13/2013 12:00 PM   % Wound base Red or Granulating  100%  01/13/2013 12:00 PM   % Wound base Yellow  0%  01/11/2013 12:00 PM   % Wound base Black  0%  01/11/2013 12:00 PM   % Wound base Other (Comment)  0%  01/03/2013 5:00 PM   Peri-wound Assessment  Intact  01/11/2013 12:00 PM   Wound Length (cm)  ..25  01/18/2013  Wound Width (cm)  ..25 01/18/2013  Wound Depth (cm)  0 cm  01/09/2013 3:00 PM   Margins  Attached edges (approximated)  01/11/2013 12:00 PM   Closure  None  01/11/2013 12:00 PM   Drainage Amount  Moderate  01/11/2013 12:00 PM   Drainage Description  No odor  01/11/2013 12:00 PM   Non-staged Wound Description  Not applicable  0/08/6759 95:09 PM   Treatment  Cleansed;   01/18/2013 12:00 PM   Dressing Type  Impregnated gauze (bismuth);bandaid 01/11/2013 12:00 PM    Dressing Changed  Changed  01/11/2013 12:00 PM   Dressing Status  Clean;Dry;Intact  01/11/2013 12:00 PM   New skin tear 1.5 x 1.0 100% granulated, xeroform and bandaid.                                                                              Wound 11/13/12 Other (Comment) Leg Left;Lower (Active)   Site / Wound Assessment  Red;Yellow  01/11/2013 12:00 PM   % Wound base Red or Granulating  95%  01/11/2013 12:00 PM   % Wound base Yellow  5%  01/11/2013 12:00 PM   % Wound base Black  0%  01/11/2013 12:00 PM   % Wound base Other (Comment)  0%  01/11/2013 12:00 PM   Peri-wound Assessment  Intact  01/11/2013 12:00 PM   Wound Length (cm)  7.5 cm  01/03/2013 5:00 PM   Wound Width (cm)  3 cm  01/03/2013 5:00 PM   Wound Depth (cm)  0.3 cm  01/03/2013 5:00 PM   Margins  Attached edges (approximated)  01/11/2013 12:00 PM   Closure  None  01/11/2013 12:00 PM   Drainage  Amount  Moderate  01/11/2013 12:00 PM   Drainage Description  Serous  01/11/2013 12:00 PM   Non-staged Wound Description  Not applicable  08/11/5782 69:62 PM   Treatment  Cleansed;Debridement (Selective)  01/11/2013 12:00 PM   Dressing Type  Impregnated gauze (bismuth);Compression wrap  01/11/2013 12:00 PM   Dressing Changed  Changed  01/03/2013 5:00 PM   Dressing Status  Clean;Dry;Intact  01/11/2013 12:00 PM   Selective Debridement  Selective Debridement - Tools Used: Forceps;Scalpel  Selective Debridement - Tissue Removed: slough and dead skin and perimeter      Physical Therapy Assessment and Plan PT Assessment and Plan Clinical Impression Statement: Pt wound on Rt LE to be discharge there is a small area .25 cm wtih 100% granulation and a second skin tear that is 1.5 x 1.0 x .1 cm with 100% granulation family told to cleans with dial soap and use bandaids.  Lt wound improving as well.  Pt has not continued eye exercises encouraged to do eye exercises. PT Plan: D/C Rt LE wound care.  Begin step over hurdles.  Pt to begin twice a week  treatments next week  Goals  progressing  Problem List Patient Active Problem List   Diagnosis Date Noted  . SAH (subarachnoid hemorrhage) 11/22/2012  . Seizures secondary to [facility] 11/22/2012  . Bilateral lower leg cellulitis 11/13/2012  . Open wound of leg 11/13/2012  . Oral thrush 11/13/2012       GP    Arleigh Dicola,CINDY 01/18/2013, 2:33 PM

## 2013-01-20 ENCOUNTER — Ambulatory Visit (HOSPITAL_COMMUNITY)
Admission: RE | Admit: 2013-01-20 | Discharge: 2013-01-20 | Disposition: A | Discharge status: Home / Self Care | Payer: Medicare Other | Source: Ambulatory Visit | Attending: Family Medicine | Admitting: Family Medicine

## 2013-01-20 NOTE — Progress Notes (Signed)
Physical Therapy Treatment Patient Details  Name: Bridget Ball MRN: [id number] Date of Birth: 1928-05-19  Today's Date: 01/20/2013 Time: 1300-1440 PT Time Calculation (min): 100 min Charge :Selective debridement <20cm 3557-3220, NMR 1345-1425, TE 1430-1440  Visit#: 21 of 44  Re-eval: 02/08/13    Authorization: [employer] medicare  Authorization Time Period:    Authorization Visit#: 26 of 30   Subjective: Symptoms/Limitations Symptoms: Pt's daughter reports increased edema Rt LE without compressions, has laced bandaids over skin tear on Rt.  Currently pain free Pain Assessment Currently in Pain?: No/denies  Objective:   Exercise/Treatments Balance Exercises Standing Wall Bumps: Shoulder;10 reps Balance Beam: 2 RT forward, backward, sidestepping with squats Toe Raise: Limitations Toe Raise Limitations: heel/toe walking 1 RT Other Standing Exercises: rockerboard static Rt/Lt and A/P x 3 min eavh,  Shoulder extension and row s with green tband on Airex with feet side by side 10 reps each visual cueing for posture Other Standing Exercises: Nustep L 4 x 10:0   Wound care:   01/20/13 1400  Wound  Date First Assessed/Time First Assessed: 11/13/12 0420   Wound Type: Other (Comment)  Location Orientation: Right;Lateral;Lower  Present on Admission: Yes  Site / Wound Assessment Red  % Wound base Red or Granulating 95%  % Wound base Other (Comment) 5% (blood blister)  Peri-wound Assessment Intact  Margins Attached edges (approximated)  Closure None  Drainage Amount None  Drainage Description No odor  Non-staged Wound Description Not applicable  Treatment Cleansed  Dressing Type Impregnated gauze (bismuth);Compression wrap  Dressing Status Clean;Dry;Intact  Wound  Date First Assessed/Time First Assessed: 11/13/12 0425   Wound Type: Other (Comment)  Location: Leg  Location Orientation: Left;Lower  Present on Admission: Yes  Site / Wound Assessment Red;Yellow  % Wound base Red or  Granulating 80%  % Wound base Yellow 25%  % Wound base Black 0%  % Wound base Other (Comment) 0%  Peri-wound Assessment Intact  Margins Attached edges (approximated)  Closure None  Drainage Amount Scant  Drainage Description Serous  Non-staged Wound Description Not applicable  Treatment Cleansed;Debridement (Selective)  Dressing Type Gauze (Comment);Compression wrap (medihoney)  Dressing Status Clean;Dry;Intact  Selective Debridement  Selective Debridement - Location marbling throughout wound  Selective Debridement - Tools Used Forceps;Scalpel  Selective Debridement - Tissue Removed slough and deadskin perimeter of wound on Lt LE  Wound Therapy - Assess/Plan/Recommendations  Wound Therapy - Clinical Statement Resumed compression with Rt LE due to increased edema today.  No debridement neccessary for Rt LE.  Pt educated on places to buy compression hose, refaxed referral to MD 2nd attempt.  Pt plans to buy compression hose next week.  Lt LE decrease in width and overall depth, improved granulation noted following debridement to remove yellow slough.  No reports of pain through session.  NMR session focus on improving awareness with posture to assist with balance, added dynamic surface activities to improve spatial awareness with postural strengthening.  Pt limited by fatigue at end of session.    Wound Plan Continue woundcare to bilateral LE's with compression and balance therapy     Physical Therapy Assessment and Plan PT Assessment and Plan Clinical Impression Statement: Resumed compression with Rt LE due to increased edema today.  No debridement neccessary for Rt LE.  Pt educated on places to buy compression hose, refaxed referral to MD 2nd attempt.  Pt plans to buy compression hose next week.  Lt LE decrease in width and overall depth, improved granulation noted following debridement to remove  yellow slough.  No reports of pain through session.  NMR session focus on improving awareness  with posture to assist with balance, added dynamic surface activities to improve spatial awareness with postural strengthening.  Pt limited by fatigue at end of session.   PT Plan: Continue wtih wound care to Lt LE.  Continues progressing balance activiteis, resume step over hurdles next session     Goals    Problem List Patient Active Problem List   Diagnosis Date Noted  . SAH (subarachnoid hemorrhage) 11/22/2012  . Seizures secondary to [facility] 11/22/2012  . Bilateral lower leg cellulitis 11/13/2012  . Open wound of leg 11/13/2012  . Oral thrush 11/13/2012    PT - End of Session Equipment Utilized During Treatment: Gait belt Activity Tolerance: Patient tolerated treatment well General Behavior During Therapy: [facility] for tasks assessed/performed  GP    Aldona Lento 01/20/2013, 3:41 PM

## 2013-01-23 ENCOUNTER — Ambulatory Visit (HOSPITAL_COMMUNITY)
Admission: RE | Admit: 2013-01-23 | Discharge: 2013-01-23 | Disposition: A | Discharge status: Home / Self Care | Payer: Medicare Other | Source: Ambulatory Visit | Attending: Family Medicine | Admitting: Family Medicine

## 2013-01-23 NOTE — Progress Notes (Signed)
Physical Therapy Treatment Patient Details  Name: Bridget Ball MRN: [id number] Date of Birth: 31-Dec-1928  Today's Date: 01/23/2013 Time: 1355-1520 PT Time Calculation (min): 85 min  Visit#: 27 of 30  Re-eval: 02/08/13 Authorization: [employer] medicare  Authorization Visit#: 80 of 30  Charges:  Deb <20cm 1355-1440 (54'), NMR 1440-1515 (25')  Subjective: Symptoms/Limitations Symptoms: Pt states her legs are not itching as badly.  States compression felt much better on rt LE.  Daughter reports they are going to get compression stockings tomorrow in [place]. Pain Assessment Currently in Pain?: No/denies   Exercise/Treatments Balance Exercises Standing Balance Beam: 2 RT forward, backward, sidestepping with squats Toe Raise: Limitations Toe Raise Limitations: heel/toe walking 1 RT Other Standing Exercises: rockerboard static Rt/Lt and A/P x 3 min eavh,  Shoulder extension and row s with green tband on Airex with feet side by side 10 reps each visual cueing for posture Other Standing Exercises: Nustep L 4 x 10:0  Physical Therapy - Wound Therapy  Wound Therapy Wound 11/13/12 Other (Comment) Right;Lateral;Lower (Active)  Site / Wound Assessment Red 01/23/2013  6:00 PM  % Wound base Red or Granulating 100% 01/23/2013  6:00 PM  % Wound base Yellow 5% 01/16/2013  2:30 PM  % Wound base Black 0% 01/16/2013  2:30 PM  % Wound base Other (Comment) 5% 01/20/2013  2:00 PM  Peri-wound Assessment Intact 01/23/2013  6:00 PM  Wound Length (cm) 4 cm 01/03/2013  5:00 PM  Wound Width (cm) 1.2 cm 01/03/2013  5:00 PM  Wound Depth (cm) 0 cm 01/09/2013  3:00 PM  Margins Attached edges (approximated) 01/23/2013  6:00 PM  Closure None 01/23/2013  6:00 PM  Drainage Amount None 01/23/2013  6:00 PM  Drainage Description No odor 01/23/2013  6:00 PM  Non-staged Wound Description Not applicable 2/77/8242  3:53 PM  Treatment Cleansed 01/23/2013  6:00 PM  Dressing Type Impregnated gauze (bismuth);Compression wrap  01/23/2013  6:00 PM  Dressing Changed Other (Comment) 01/23/2013  6:00 PM  Dressing Status Clean;Dry;Intact 01/23/2013  6:00 PM     Wound 11/13/12 Other (Comment) Leg Right;Medial;Lower (Active)  Site / Wound Assessment Pink 01/03/2013  5:00 PM  % Wound base Red or Granulating 100% 01/03/2013  5:00 PM  % Wound base Yellow 0% 01/03/2013  5:00 PM  % Wound base Black 0% 01/03/2013  5:00 PM  % Wound base Other (Comment) 0% 01/03/2013  5:00 PM  Peri-wound Assessment Intact 01/03/2013  5:00 PM  Wound Length (cm) 0 cm 01/03/2013  5:00 PM  Wound Width (cm) 0 cm 01/03/2013  5:00 PM  Wound Depth (cm) 0 cm 01/03/2013  5:00 PM  Margins Attached edges (approximated) 01/03/2013  5:00 PM  Closure Approximated 01/03/2013  5:00 PM  Drainage Amount None 01/03/2013  5:00 PM  Drainage Description Serous 12/30/2012  7:15 PM  Non-staged Wound Description Not applicable 61/44/3154  0:08 PM  Treatment Other (Comment) 01/03/2013  5:00 PM  Dressing Type Compression wrap;ABD;Other (Comment) 12/30/2012  7:15 PM  Dressing Changed Changed 12/30/2012  7:15 PM  Dressing Status None 01/03/2013  5:00 PM     Wound 11/13/12 Other (Comment) Leg Left;Lower (Active)  Site / Wound Assessment Red;Yellow 01/23/2013  6:00 PM  % Wound base Red or Granulating 85% 01/23/2013  6:00 PM  % Wound base Yellow 15% 01/23/2013  6:00 PM  % Wound base Black 0% 01/23/2013  6:00 PM  % Wound base Other (Comment) 0% 01/23/2013  6:00 PM  Peri-wound Assessment Intact 01/23/2013  6:00 PM  Wound Length (cm) 7.5 cm 01/03/2013  5:00 PM  Wound Width (cm) 3 cm 01/03/2013  5:00 PM  Wound Depth (cm) 0.3 cm 01/03/2013  5:00 PM  Margins Attached edges (approximated) 01/23/2013  6:00 PM  Closure None 01/23/2013  6:00 PM  Drainage Amount Scant 01/23/2013  6:00 PM  Drainage Description Serous 01/23/2013  6:00 PM  Non-staged Wound Description Not applicable 2/67/1245  8:09 PM  Treatment Cleansed;Debridement (Selective) 01/23/2013  6:00 PM  Dressing Type Gauze  (Comment);Compression wrap 01/23/2013  6:00 PM  Dressing Changed Changed 01/23/2013  6:00 PM  Dressing Status Clean;Dry;Intact 01/23/2013  6:00 PM   Selective Debridement Selective Debridement - Location: marbling throughout wound Selective Debridement - Tools Used: Forceps;Scalpel Selective Debridement - Tissue Removed: slough and deadskin perimeter of wound on Lt LE   Physical Therapy Assessment and Plan PT Assessment and Plan Clinical Impression Statement: Pt depressed/crying during session today not related to treatment.  Pt unable to complete all balance activity as stopped frequently due to getting upset/fatigue.  Pt able to complete tandem on balance beam without LOB today.  Lt LE wound with increased granulation and noted approximation.   PT Plan: Continue with wound care to Lt LE and bandaging Rt LE until compression received.  Continues progressing balance activiteis, resume step over hurdles next session  Re-evaluate X 3 more sessions     Problem List Patient Active Problem List   Diagnosis Date Noted  . SAH (subarachnoid hemorrhage) 11/22/2012  . Seizures secondary to [facility] 11/22/2012  . Bilateral lower leg cellulitis 11/13/2012  . Open wound of leg 11/13/2012  . Oral thrush 11/13/2012    GP    Teena Irani, PTA/CLT 01/23/2013, 6:21 PM

## 2013-01-24 ENCOUNTER — Encounter: Payer: Self-pay | Admitting: Neurology

## 2013-01-24 ENCOUNTER — Encounter (INDEPENDENT_AMBULATORY_CARE_PROVIDER_SITE_OTHER): Payer: Self-pay

## 2013-01-24 ENCOUNTER — Ambulatory Visit (INDEPENDENT_AMBULATORY_CARE_PROVIDER_SITE_OTHER): Payer: Medicare Other | Admitting: Neurology

## 2013-01-24 VITALS — BP 108/70 | HR 82 | Ht 61.0 in | Wt 118.0 lb

## 2013-01-24 DIAGNOSIS — F329 Major depressive disorder, single episode, unspecified: Secondary | ICD-10-CM

## 2013-01-24 DIAGNOSIS — F3289 Other specified depressive episodes: Secondary | ICD-10-CM

## 2013-01-24 DIAGNOSIS — F32A Depression, unspecified: Secondary | ICD-10-CM

## 2013-01-24 DIAGNOSIS — R413 Other amnesia: Secondary | ICD-10-CM

## 2013-01-24 MED ORDER — CITALOPRAM HYDROBROMIDE 10 MG PO TABS: 10.0000 mg | ORAL_TABLET | Freq: Every day | ORAL | Status: DC

## 2013-01-24 NOTE — Patient Instructions (Signed)
I had a long discussion with the patient and daughter regarding her recent hospitalization, discuss results of imaging and other tests and answered questions. Recommend decrease Keppra to 250 mg twice a day due to recent memory loss and confusion as well as check EEG for silent seizures an MRI scan for followup. Start Celexa 10 mg daily for depression. Return for followup in 3 months or call earlier if necessary

## 2013-01-24 NOTE — Progress Notes (Signed)
[facility] [address] [place]. [place] [address] [phone]       OFFICE FOLLOW-UP NOTE  [name] Date of Birth:  1928/06/28 Medical Record Number:  [id number]   HPI: 55 year Caucasian lady seen for followup following hospital admission on 11/19/12 for subarachnoid hemorrhage and seizures. She presented with recurrent transient episodes of left body paresthesias and difficulty speaking. The episode began in the left arm and gradually over minutes progressed to involve the face and lasted several minutes only. At times she was unable to form words and speak but she could understand what other people were saying. She described this as a feeling of pins and needles but without any impairment of consciousness. She had similar episodes in 2010 when she had a similar localized right frontal convexity subarachnoid hemorrhage. She had had no head trauma or significant headaches or had bumped her head. And at times MRI scan had shown similar findings to the current one which showed localized blood in the right frontal convexity with some diffusion positive changes suggesting microinfarction adjacent. She previously has cerebral catheter angiogram performed which showed no evidence of underlying AVM, aneurysms or vascular abnormalities. She was started on Neurontin for seizures and did well for a while but then gradually decided to stop the medication she did follow up with me for 1 year   but subsequently was lost to followup. She had EEG during the previous admission and 2010 which did not show definite seizure activity.She had a repeat EEG on 11/21/12 which was normal. CT angiogram of the brain on 11/16/14and showed no evidence of underlying aneurysm or vascular abnormality. Urine drug screen was negative. Patient was started on Keppra initially 500 twice a day subsequent when episodes persisted was increased to thousand twice a day and Depakote ER was added. Patient states  she's did well since discharge and has not had recurrent episodes of numbness however she has had confusion and agitation issues and hence has stopped the Depakote and in fact has even reduce the Keppra to 500 twice daily. Patient complains of short-term problems as well as intermittent confusion, balance issues. She forgets how to use a credit card and daughter has to help her in on several occasions. She previously had home so [place] which appear to be healing now and she does see a home care nurse twice a week. She hasn't never been diagnosed in the past with depression or been on any medication for that. She denies headache, focal extremity weakness, numbness or vision problems.  ROS:   14 system review of systems is positive for appetite change, fatigue, shortness of breath, leg swelling, murmur, memory loss, dizziness, back pain, insomnia, walking difficulty, coordination problem, confusion, agitation, depression, anxiety and all other systems negative. PMH:  Past Medical History  Diagnosis Date  . COPD (chronic obstructive pulmonary disease)   . Arthritis   . Stroke     TIA four years ago    Social History:  History   Social History  . Marital Status: Widowed    Spouse Name: N/A    Number of Children: 6  . Years of Education: college   Occupational History  . Not on file.   Social History Main Topics  . Smoking status: Former Smoker -- 1.00 packs/day for 50 years    Quit date: 11/13/1997  . Smokeless tobacco: Not on file  . Alcohol Use: Yes     Comment: 2 glasses a night  . Drug Use: No  .  Sexual Activity: Not Currently   Other Topics Concern  . Not on file   Social History Narrative  . No narrative on file    Medications:   Current Outpatient Prescriptions on File Prior to Visit  Medication Sig Dispense Refill  . levETIRAcetam (KEPPRA) 1000 MG tablet Take 1 tablet (1,000 mg total) by mouth 2 (two) times daily.  60 tablet  2  . LORazepam (ATIVAN) 0.5 MG tablet Take  1 tablet (0.5 mg total) by mouth 3 (three) times daily as needed for anxiety.  30 tablet  0  . thiamine 100 MG tablet Take 1 tablet (100 mg total) by mouth daily.  30 tablet  3   No current facility-administered medications on file prior to visit.    Allergies:   Allergies  Allergen Reactions  . Penicillins Anaphylaxis    Physical Exam General: well developed, well nourished elderly Caucasian lady, seated, in no evident distress Head: head normocephalic and atraumatic. Orohparynx benign Neck: supple with no carotid or supraclavicular bruits Cardiovascular: regular rate and rhythm, no murmurs Musculoskeletal: no deformity Skin:  no rash/petichiae Vascular:  Normal pulses all extremities Filed Vitals:   01/24/13 1452  BP: 108/70  Pulse: 82   Neurologic Exam Mental Status: Awake and fully alert. Oriented to place and time. Recent and remote memory intact. Attention span, concentration and fund of knowledge appropriate. Mood and affect appear mildly depressed. Geriatric depression scale score 7 suggestive of mild depression. Mini-Mental status exam scored 29/30 with only one deficit in drawing. Animal fluency test 12.  Cranial Nerves: Fundoscopic exam reveals sharp disc margins. Pupils equal, briskly reactive to light. Extraocular movements full without nystagmus. Visual fields full to confrontation. Hearing intact. Facial sensation intact. Face, tongue, palate moves normally and symmetrically.  Motor: Normal bulk and tone. Normal strength in all tested extremity muscles. Sensory.: intact to touch and pinprick and vibratory sensation.  Coordination: Rapid alternating movements normal in all extremities. Finger-to-nose and heel-to-shin performed accurately bilaterally. Gait and Station: Arises from chair without difficulty. Stance is normal. Gait demonstrates normal stride length and balance . Able to heel, toe and tandem walk without difficulty.  Reflexes: 1+ and symmetric. Toes  downgoing.   NIHSS  0 Modified Rankin  1  ASSESSMENT: 61 year Caucasian lady with a recurrent right frontal convexity localized tiny subarachnoid hemorrhage with simple partial seizures. Prior history of similar episode in 2010 with cerebral catheter angiogram showing no underlying vascular abnormality. Persistent mild confusion and memory loss needs further evaluation    PLAN: I had a long discussion with the patient and daughter regarding her recent hospitalization, discuss results of imaging and other tests and answered questions. Recommend decrease Keppra to 250 mg twice a day due to recent memory loss and confusion as well as check EEG for silent seizures an MRI scan for followup. Start Celexa 10 mg daily for depression. Return for followup in 3 months or call earlier if necessary

## 2013-01-25 ENCOUNTER — Ambulatory Visit (HOSPITAL_COMMUNITY)
Admission: RE | Admit: 2013-01-25 | Discharge: 2013-01-25 | Disposition: A | Discharge status: Home / Self Care | Payer: Medicare Other | Source: Ambulatory Visit | Attending: Family Medicine | Admitting: Family Medicine

## 2013-01-25 NOTE — Progress Notes (Addendum)
Physical Therapy Treatment Patient Details  Name: Bridget Ball MRN: [id number] Date of Birth: 12/09/1928  Today's Date: 01/25/2013 Time: 4158-3094 PT Time Calculation (min): 95 min Charge :Selective debridement <20 cm 1300-1350, NMR 1355-1425, TE 1425-1435  Visit#: 28 of 30  Re-eval: 02/08/13    Authorization: [employer] medicare  Authorization Time Period:    Authorization Visit#: 28 of 30   Subjective: Symptoms/Limitations Symptoms: Pt reports neurologist apt today, able to pass all tests except for the tandem stance/gait.  No further findings.  Pt brought in her new 20-30 mmHG knee high compression stockings.  Pt stated burning on Lt ankle, no real pain. Pain Assessment Currently in Pain?: No/denies  Objective:   Exercise/Treatments  01/25/13 1600  Subjective Assessment  Subjective Pt reports neurologist apt today, able to pass all tests except for the tandem stance/gait.  No further findings.  Pt brought in her new 20-30 mmHG knee high compression stockings.  Pt stated burning on Lt ankle, no real pain.  Wound  Date First Assessed/Time First Assessed: 11/13/12 0420   Wound Type: Other (Comment)  Location Orientation: Right;Lateral;Lower  Present on Admission: Yes  Site / Wound Assessment Red  % Wound base Red or Granulating 100%  % Wound base Other (Comment) 0%  Peri-wound Assessment Intact  Margins Attached edges (approximated)  Closure None  Drainage Amount Scant  Drainage Description No odor  Non-staged Wound Description Not applicable  Dressing Type Impregnated gauze (bismuth);Compression wrap (bandaid, 20-30 mmHg compression hose knee high)  Dressing Status Clean;Dry;Intact  Wound  Date First Assessed/Time First Assessed: 11/13/12 0425   Wound Type: Other (Comment)  Location: Leg  Location Orientation: Left;Lower  Present on Admission: Yes  Site / Wound Assessment Red;Yellow  % Wound base Red or Granulating 90%  % Wound base Yellow 5%  % Wound base Black 0%  %  Wound base Other (Comment) 0%  Peri-wound Assessment Intact  Margins Attached edges (approximated)  Closure None  Drainage Amount Scant  Drainage Description Serous  Non-staged Wound Description Not applicable  Dressing Type Gauze (Comment);Compression wrap (medihoney, vaseline around wound perimeter)  Dressing Status Clean;Dry;Intact  Selective Debridement  Selective Debridement - Location marbling throughout wound  Selective Debridement - Tools Used Forceps;Scalpel  Selective Debridement - Tissue Removed slough and deadskin perimeter of wound on Lt LE  Wound Therapy - Assess/Plan/Recommendations  Wound Therapy - Clinical Statement No debridement necessary for Rt LE, applied xeroform and bandaid for scant drainage and instructed application for new compressions hose. Significant improvements noted with increased granulation following debridement.  Continued with medihoney and compression wrap.  Pt making vast improvements with increased ease with balance activities today, less manual assistance required for LOB episodes on dynamic surfaces.  Wound Plan F/U with Rt LE compression hose.  Continue wound care to Lt LE and balance activtiies.  Re-eval in 2 more sessions    Balance Exercises Standing Wall Bumps: Shoulder;15 reps Balance Beam: 2 RT forward, backward, sidestepping  Step Over Hurdles / Cones: 6 and 12in alternating 2RT Other Standing Exercises: rockerboard static Rt/Lt and A/P x 3 min eavh,   Other Standing Exercises: Nustep L 4 x 10:0    Physical Therapy Assessment and Plan PT Assessment and Plan Clinical Impression Statement: No debridement necessary for Rt LE, applied xeroform and bandaid for scant drainage and instructed application for new compressions hose. Significant improvements noted with increased granulation following debridement.  Continued with medihoney and compression wrap for Lt LE.  Pt making vast improvements with increased  ease with balance activities today,  no  assistance required for LOB episodes on dynamic surfaces. PT Plan: Continue with wound care to Lt LE.  Continues progressing balance activitei.  Resume postural education on dynamic surface.  Re-evaluate X 2 more sessions    Goals    Problem List Patient Active Problem List   Diagnosis Date Noted  . SAH (subarachnoid hemorrhage) 11/22/2012  . Seizures secondary to [facility] 11/22/2012  . Bilateral lower leg cellulitis 11/13/2012  . Open wound of leg 11/13/2012  . Oral thrush 11/13/2012       GP    Aldona Lento 01/25/2013, 4:32 PM

## 2013-01-27 ENCOUNTER — Ambulatory Visit (HOSPITAL_COMMUNITY)
Admission: RE | Admit: 2013-01-27 | Discharge: 2013-01-27 | Disposition: A | Discharge status: Home / Self Care | Payer: Medicare Other | Source: Ambulatory Visit | Attending: Family Medicine | Admitting: Family Medicine

## 2013-01-27 NOTE — Progress Notes (Signed)
Physical Therapy Treatment Patient Details  Name: Bridget Ball MRN: [id number] Date of Birth: Jan 01, 1929  Today's Date: 01/27/2013 Time: 1300-1430 PT Time Calculation (min): 90 min Charge: Selective debridement <20 cm 1305-1340, NMR 1345-1430  Visit#: 29 of 30  Re-eval: 02/08/13    Authorization: [employer] medicare  Authorization Time Period:    Authorization Visit#: 29 of 30   Subjective: Symptoms/Limitations Symptoms: Pain free today,  Daughter reports compression hose ripped, going back to store to exchange hose.   Pain Assessment Currently in Pain?: No/denies  Objective:   Exercise/Treatments  01/27/13 1815  Subjective Assessment  Subjective Pain free today,  Daughter reports compression hose ripped, going back to store to exchange hose.    Wound  Date First Assessed/Time First Assessed: 11/13/12 0420   Wound Type: Other (Comment)  Location Orientation: Right;Lateral;Lower  Present on Admission: Yes  Treatment Cleansed  Dressing Type None  Wound  Date First Assessed/Time First Assessed: 11/13/12 0425   Wound Type: Other (Comment)  Location: Leg  Location Orientation: Left;Lower  Present on Admission: Yes  Site / Wound Assessment Red;Yellow  % Wound base Red or Granulating 95%  % Wound base Yellow 5%  Peri-wound Assessment Intact  Margins Attached edges (approximated)  Closure None  Drainage Amount Scant  Drainage Description Serous  Non-staged Wound Description Not applicable  Treatment Cleansed;Debridement (Selective)  Dressing Type Gauze (Comment);Compression wrap (medihoney)  Dressing Status Clean;Dry;Intact  Selective Debridement  Selective Debridement - Location marbling throughout wound  Selective Debridement - Tools Used Forceps;Scalpel  Selective Debridement - Tissue Removed slough and deadskin perimeter of wound on Lt LE  Wound Therapy - Assess/Plan/Recommendations  Wound Therapy - Clinical Statement Lt wound improving granualtion following debridement  with decreased depth overall.  Wound progressing well wtih medihoney.  COntinued with medihoney and compression hose to Lt LE, no wound care complete for Rt LE.  Pt plans to go exchange compression hose next week.    Wound Plan Continue with wound care for Lt LE.      Balance Exercises Standing Wall Bumps: Shoulder;15 reps Balance Beam: 2 RT forward, backward, sidestepping  Step Over Hurdles / Cones: 6 and 12in alternating 2RT Numbers 1-15: Foam;1 rep;Limitations Numbers 1-15 Limitations: each UE Cone Rotation: Foam;Right turn;Left turn Other Standing Exercises: Postural strengthening on airex with feet side by side for balance: 15reps for retraction, rows and extensin   Physical Therapy Assessment and Plan PT Assessment and Plan Clinical Impression Statement: Progressed to postural strengthening exercises with balance on dynamic surface.  Pt explained importance of posture to assist with balance.  Pt overall improved balance with advanced activities, less cueing or assistance required.   PT Plan: Complete measuremant and continue with wound care to Lt LE.  Re-eval next session for wound and balance.    Goals    Problem List Patient Active Problem List   Diagnosis Date Noted  . SAH (subarachnoid hemorrhage) 11/22/2012  . Seizures secondary to [facility] 11/22/2012  . Bilateral lower leg cellulitis 11/13/2012  . Open wound of leg 11/13/2012  . Oral thrush 11/13/2012    PT - End of Session Equipment Utilized During Treatment: Gait belt Activity Tolerance: Patient tolerated treatment well General Behavior During Therapy: [facility] for tasks assessed/performed  GP    Aldona Lento 01/27/2013, 6:42 PM

## 2013-01-31 ENCOUNTER — Ambulatory Visit (HOSPITAL_COMMUNITY)
Admission: RE | Admit: 2013-01-31 | Discharge: 2013-01-31 | Disposition: A | Discharge status: Home / Self Care | Payer: Medicare Other | Source: Ambulatory Visit | Attending: Family Medicine | Admitting: Family Medicine

## 2013-01-31 NOTE — Progress Notes (Signed)
Physical Therapy Re-evaluation/Wound care/Discharge Summary for balance training and Lt LE wound care  Patient Details  Name: Bridget Ball MRN: [id number] Date of Birth: 1928/10/24  Today's Date: 01/31/2013 Time: 6659-9357 PT Time Calculation (min): 70 min Charge: Selective debridement <20cm 0177-9390, Physical performance testing 1515-1540, Gait 1540-1548              Visit#: 30 of 30  Re-eval: 02/08/13    Authorization: [employer] medicare    Authorization Time Period:    Authorization Visit#: 30 of 30   Subjective Symptoms/Limitations Symptoms: Daughter reported "Mom is back to herslef", reports decrease in seizure medication and new antidepressant medication, pt is no longer have outburst of tears.  Reports baking cookies yesterday, pt proud of achievement. Pain Assessment Currently in Pain?: No/denies  Objective:   Assessment RLE Strength Right Hip Flexion: 5/5 (was 5/5 on 01/03/2013) Right Hip Extension: 5/5 (was 5/5 on 01/03/2013) Right Hip ABduction: 5/5 (was 5/5 on 01/03/2013) Right Hip ADduction: 5/5 (was 5/5 on 01/03/2013) Right Knee Flexion: 5/5 (was 5/5 on 01/03/2013) Right Knee Extension: 5/5 (was 5/5 on 01/03/2013) LLE Strength Left Hip Flexion: 5/5 (was 5/5 on 01/03/2013) Left Hip Extension: 5/5 (was 5/5 on 01/03/2013) Left Hip ABduction: 5/5 (was 5/5 on 01/03/2013) Left Hip ADduction: 5/5 (was 4/5 01/03/2013) Left Knee Flexion: 5/5 (was 4/5 01/03/2013) Left Knee Extension: 5/5 (was 4+/5 01/03/2013)  Exercise/Treatments Mobility/Balance  Berg Balance Test Sit to Stand: Able to stand without using hands and stabilize independently Standing Unsupported: Able to stand safely 2 minutes Sitting with Back Unsupported but Feet Supported on Floor or Stool: Able to sit safely and securely 2 minutes Stand to Sit: Sits safely with minimal use of hands Transfers: Able to transfer safely, minor use of hands Standing Unsupported with Eyes Closed: Able to stand 10 seconds  safely Standing Ubsupported with Feet Together: Able to place feet together independently and stand 1 minute safely From Standing, Reach Forward with Outstretched Arm: Can reach forward >12 cm safely (5") From Standing Position, Pick up Object from Floor: Able to pick up shoe safely and easily From Standing Position, Turn to Look Behind Over each Shoulder: Looks behind one side only/other side shows less weight shift Turn 360 Degrees: Able to turn 360 degrees safely in 4 seconds or less Standing Unsupported, Alternately Place Feet on Step/Stool: Able to stand independently and complete 8 steps >20 seconds Standing Unsupported, One Foot in Front: Able to plae foot ahead of the other independently and hold 30 seconds Standing on One Leg: Able to lift leg independently and hold > 10 seconds Total Score: 52   Standing Stairs: 2RT reciprocal pattern 1 handrail no assistance necessary       01/31/13 1600  Subjective Assessment  Subjective Daughter reported "Mom is back to herslef", reports decrease in seizure medication and new antidepressant medication, pt is no longer have outburst of tears.  Reports baking cookies yesterday, pt proud of achievement.  Wound  Date First Assessed/Time First Assessed: 11/13/12 0420   Wound Type: Other (Comment)  Location Orientation: Right;Lateral;Lower  Present on Admission: Yes  Dressing Type (bandaid)  Wound  Date First Assessed/Time First Assessed: 11/13/12 0425   Wound Type: Other (Comment)  Location: Leg  Location Orientation: Left;Lower  Present on Admission: Yes  Site / Wound Assessment Red;Yellow  % Wound base Red or Granulating 95%  % Wound base Yellow 5%  % Wound base Black 0%  % Wound base Other (Comment) 0%  Peri-wound Assessment Intact  Margins  Attached edges (approximated)  Closure None  Drainage Amount Scant  Drainage Description Serous  Non-staged Wound Description Not applicable  Dressing Type Gauze (Comment);Compression  wrap (medihoney)  Dressing Status Clean;Dry;Intact  Selective Debridement  Selective Debridement - Location marbling throughout wound  Selective Debridement - Tools Used Forceps;Scalpel  Selective Debridement - Tissue Removed slough and deadskin perimeter of wound on Lt LE  Wound Therapy - Assess/Plan/Recommendations  Wound Therapy - Clinical Statement Measurements complete with significant improvements, noted increased granualation with 5% slough remaining in Lt LE wound.  Decrease in length, width and depth.  No debridement necessary for Rt LE.  Pt's daugther reported they will be back in [place] tomorrow to exchange compression hose.  No reoprts of pain through session.    Wound Plan Continue with wound care for Lt LE, D/C wound care to Rt LE and balance training  Wound Therapy Goals - Improve the function of patient's integumentary system by progressing the wound(s) through the phases of wound healing by:  Decrease Necrotic Tissue to 0  Increase Granulation Tissue to 100  Decrease Length/Width/Depth by (cm) 50%  Improve Drainage Characteristics Min  Patient/Family will be able to  I in wound dressing  Wound Therapy - Potential for Goals Good     Physical Therapy Assessment and Plan PT Assessment and Plan Clinical Impression Statement: Re-assessment complete for wounds and balance.  Overall Lt LE wound is making significant improvements with decreased slough, decrease in length, width and depth.  Pt able to tolerance selective debridement with no c/o pain through session.  BERG balance test score 52-56 today, pt able to complete STS without HHA necessary, improved SLR to 16" 1 max and overall BIl LE strength 5/5.  Pt able to demonstrate reciprcal pattern stairwell with 1 handrail safely with no assistance required.  Pt encouraged to increase frequency with balance HEP. PT Plan: Recommend D/C to Rt LE wound care and balance training.  Continue wound care to Lt LE for 4 more weeks.     Goals PT Short Term Goals PT Short Term Goal 1: Pt to able to come sit to stand without UE PT Short Term Goal 1 - Progress: Met PT Short Term Goal 2: Pt to be able to SLS x 8sec Bil to reduce risk of falling PT Short Term Goal 2 - Progress: Met PT Short Term Goal 3: Pt to be I in HEP PT Short Term Goal 3 - Progress: Progressing toward goal PT Long Term Goals PT Long Term Goal 1: I in advanced HEP PT Long Term Goal 1 - Progress: Progressing toward goal PT Long Term Goal 2: Pt to be ambulating inside without a cane PT Long Term Goal 2 - Progress: Met Long Term Goal 3: Pt to be able to go up and down step reciprocally with a handrail Long Term Goal 3 Progress: Met  Problem List Patient Active Problem List   Diagnosis Date Noted  . SAH (subarachnoid hemorrhage) 11/22/2012  . Seizures secondary to [facility] 11/22/2012  . Bilateral lower leg cellulitis 11/13/2012  . Open wound of leg 11/13/2012  . Oral thrush 11/13/2012    PT - End of Session Activity Tolerance: Patient tolerated treatment well General Behavior During Therapy: WFL for tasks assessed/performed  GP Functional Assessment Tool Used: clinical judgement Functional Limitation: Other PT primary Other PT Primary Current Status (U4403): At least 1 percent but less than 20 percent impaired, limited or restricted Other PT Primary Goal Status (K7425): At least 1 percent but less  than 20 percent impaired, limited or restricted  Aldona Lento 01/31/2013, 4:14 PM  Physician Documentation Your signature is required to indicate approval of the treatment plan as stated above.  Please sign and either send electronically or make a copy of this report for your files and return this physician signed original.   Please mark one 1.__approve of plan  2. ___approve of plan with the following conditions.   ______________________________                                                          _____________________ Physician Signature                                                                                                              Date

## 2013-02-01 ENCOUNTER — Other Ambulatory Visit (INDEPENDENT_AMBULATORY_CARE_PROVIDER_SITE_OTHER): Payer: Medicare Other | Admitting: Radiology

## 2013-02-01 DIAGNOSIS — R413 Other amnesia: Secondary | ICD-10-CM

## 2013-02-02 ENCOUNTER — Ambulatory Visit (HOSPITAL_COMMUNITY)
Admission: RE | Admit: 2013-02-02 | Discharge: 2013-02-02 | Disposition: A | Discharge status: Home / Self Care | Payer: Medicare Other | Source: Ambulatory Visit | Attending: Family Medicine | Admitting: Family Medicine

## 2013-02-02 NOTE — Progress Notes (Signed)
Agree with treatment plan.

## 2013-02-02 NOTE — Progress Notes (Signed)
Physical Therapy - Wound Therapy  Treatment   Patient Details  Name: Bridget Ball MRN: [id number] Date of Birth: [birth year]  Today's Date: 02/02/2013 Time: 1350-1430 Time Calculation (min): 40 min Charge selective debridement <20cm  Visit#: 31 of 38  Re-eval: 02/28/13  Subjective Subjective Assessment Subjective: Pt reported pain free today, have new pair of compression hose for Rt LE.   Pain Assessment Pain Assessment Pain Assessment: No/denies pain  Wound Therapy  02/02/13 1617  Subjective Assessment  Subjective Pt reported pain free today, have new pair of compression hose for Rt LE.   Wound  Date First Assessed/Time First Assessed: 11/13/12 0425   Wound Type: Other (Comment)  Location: Leg  Location Orientation: Left;Lower  Present on Admission: Yes  Site / Wound Assessment Red;Yellow  % Wound base Red or Granulating 95%  % Wound base Yellow 5%  % Wound base Black 0%  % Wound base Other (Comment) 0%  Peri-wound Assessment Intact  Margins Attached edges (approximated)  Closure None  Drainage Amount Scant  Drainage Description Serous  Non-staged Wound Description Not applicable  Treatment Cleansed;Debridement (Selective)  Dressing Type Gauze (Comment);Compression wrap (Medihoney)  Dressing Changed New  Dressing Status Clean;Dry;Intact  Selective Debridement  Selective Debridement - Location marbling throughout wound  Selective Debridement - Tools Used Forceps;Scalpel  Selective Debridement - Tissue Removed slough and deadskin perimeter of wound on Lt LE  Wound Therapy - Assess/Plan/Recommendations  Wound Therapy - Clinical Statement Wound progressing well, less debridement necessary to remove marbeling slough ih Lt LE, noted decreased depth on proximal portion of wound.  Continued with medihoney, vaseline applied around perimeter of wound and compression dressings.    Wound Plan Continue with wound care for Lt LE, D/C wound care to Rt LE and balance training     Selective Debridement Selective Debridement - Location: marbling throughout wound Selective Debridement - Tools Used: Forceps;Scalpel Selective Debridement - Tissue Removed: slough and deadskin perimeter of wound on Lt LE   Physical Therapy Assessment and Plan Wound Therapy - Assess/Plan/Recommendations Wound Therapy - Clinical Statement: Wound progressing well, less debridement necessary to remove marbeling slough ih Lt LE, noted decreased depth on proximal portion of wound.  Continued with medihoney, vaseline applied around perimeter of wound and compression dressings.   Wound Plan: Continue with wound care for Lt LE, D/C wound care to Rt LE and balance training      Goals    Problem List Patient Active Problem List   Diagnosis Date Noted  . SAH (subarachnoid hemorrhage) 11/22/2012  . Seizures secondary to [facility] 11/22/2012  . Bilateral lower leg cellulitis 11/13/2012  . Open wound of leg 11/13/2012  . Oral thrush 11/13/2012    GP    Aldona Lento 02/02/2013, 4:24 PM

## 2013-02-07 ENCOUNTER — Ambulatory Visit (HOSPITAL_COMMUNITY)
Admission: RE | Admit: 2013-02-07 | Discharge: 2013-02-07 | Disposition: A | Discharge status: Home / Self Care | Payer: Medicare Other | Source: Ambulatory Visit | Attending: Family Medicine | Admitting: Family Medicine

## 2013-02-07 DIAGNOSIS — S91009A Unspecified open wound, unspecified ankle, initial encounter: Secondary | ICD-10-CM

## 2013-02-07 DIAGNOSIS — L03119 Cellulitis of unspecified part of limb: Secondary | ICD-10-CM

## 2013-02-07 DIAGNOSIS — X58XXXA Exposure to other specified factors, initial encounter: Secondary | ICD-10-CM | POA: Insufficient documentation

## 2013-02-07 DIAGNOSIS — S81809A Unspecified open wound, unspecified lower leg, initial encounter: Secondary | ICD-10-CM

## 2013-02-07 DIAGNOSIS — IMO0001 Reserved for inherently not codable concepts without codable children: Secondary | ICD-10-CM | POA: Insufficient documentation

## 2013-02-07 DIAGNOSIS — L02419 Cutaneous abscess of limb, unspecified: Secondary | ICD-10-CM | POA: Insufficient documentation

## 2013-02-07 DIAGNOSIS — S81009A Unspecified open wound, unspecified knee, initial encounter: Secondary | ICD-10-CM | POA: Insufficient documentation

## 2013-02-07 NOTE — Progress Notes (Signed)
Physical Therapy - Wound Therapy  Treatment   Patient Details  Name: Bridget Ball MRN: [id number] Date of Birth: Jun 20, 1928  Today's Date: 02/07/2013 Time: 2202-5427 Time Calculation (min): 45 min Visit#: 32 of 38  Re-eval: 02/28/13 Charges:  Deb<20cm  Subjective Subjective Assessment Subjective: Pt states her legs are feeling good; can't wait to take a shower  Pain Assessment Pain Assessment Pain Assessment: No/denies pain  Wound Therapy Wound 11/13/12 Other (Comment) Right;Lateral;Lower (Active)  Site / Wound Assessment Red 01/25/2013  4:00 PM  % Wound base Red or Granulating 100% 01/25/2013  4:00 PM  % Wound base Yellow 0% 01/16/2013  2:30 PM  % Wound base Black 0% 01/16/2013  2:30 PM  % Wound base Other (Comment) 0% 01/25/2013  4:00 PM  Peri-wound Assessment Intact 01/25/2013  4:00 PM  Wound Length (cm) 1 cm 01/03/2013  5:00 PM  Wound Width (cm) 1 cm 01/03/2013  5:00 PM  Wound Depth (cm) 0 cm 01/09/2013  3:00 PM  Margins Attached edges (approximated) 01/25/2013  4:00 PM  Closure None 01/25/2013  4:00 PM  Drainage Amount Scant 01/25/2013  4:00 PM  Drainage Description No odor 01/25/2013  4:00 PM  Non-staged Wound Description Not applicable 0/62/3762  8:31 PM  Treatment Cleansed, debridement 01/31/2013  3:30 PM  Dressing Type Xeroform, 2X2, medipore and compression hose 01/27/2013  6:15 PM  Dressing Changed New 01/25/2013  3:00 PM  Dressing Status Clean;Dry;Intact 01/25/2013  4:00 PM        Wound 11/13/12 Other (Comment) Leg Left;Lower (Active)  Site / Wound Assessment Red;Yellow 02/07/2013  2:07 PM  % Wound base Red or Granulating 95% 02/07/2013  2:07 PM  % Wound base Yellow 5% 02/07/2013  2:07 PM  % Wound base Black 0% 02/07/2013  2:07 PM  % Wound base Other (Comment) 0% 02/07/2013  2:07 PM  Peri-wound Assessment Intact 02/07/2013  2:07 PM  Wound Length (cm) 6 cm 01/31/2013  3:30 PM  Wound Width (cm) 2 cm 01/31/2013  3:30 PM  Wound Depth (cm) 0.1 cm 01/31/2013  3:30 PM  Margins Attached  edges (approximated) 02/07/2013  2:07 PM  Closure None 02/07/2013  2:07 PM  Drainage Amount Scant 02/07/2013  2:07 PM  Drainage Description Serous 02/07/2013  2:07 PM  Non-staged Wound Description Not applicable 05/06/7614  0:73 PM  Treatment Cleansed;Debridement (Selective) 02/07/2013  2:07 PM  Dressing Type Impregnated gauze (bismuth), 4X4, kling and coban, #5 netting 02/07/2013  2:07 PM  Dressing Changed New 02/07/2013  2:07 PM  Dressing Status Clean;Dry;Intact 02/07/2013  2:07 PM   Selective Debridement Selective Debridement - Location: marbling throughout wound bilateral LE's Selective Debridement - Tools Used: Forceps;Scalpel Selective Debridement - Tissue Removed: slough and deadskin perimeter of wound on Lt LE   Physical Therapy Assessment and Plan Wound Therapy - Assess/Plan/Recommendations Wound assessment:  Rt LE wound continues to drain with approx 5% slough present.  Able to debride all slough.  Continued with dressing using xeroform, 2X2, medipore tape and compression garment.  Rt LE with only 5% marbled slough following debridement.  Changed dressing to xeroform and continued with compression using coban.  Noted hypergranulation in central wound.  Pt tolerated debridement well.  Wound Plan: Continue with wound care for Bilateral LE's using xeroform gauze.      Problem List Patient Active Problem List   Diagnosis Date Noted  . SAH (subarachnoid hemorrhage) 11/22/2012  . Seizures secondary to [facility] 11/22/2012  . Bilateral lower leg cellulitis 11/13/2012  . Open wound of  leg 11/13/2012  . Oral thrush 11/13/2012     Teena Irani, PTA/CLT 02/07/2013, 2:24 PM

## 2013-02-09 ENCOUNTER — Ambulatory Visit (HOSPITAL_COMMUNITY)
Admission: RE | Admit: 2013-02-09 | Discharge: 2013-02-09 | Disposition: A | Discharge status: Home / Self Care | Payer: Medicare Other | Source: Ambulatory Visit | Attending: Family Medicine | Admitting: Family Medicine

## 2013-02-09 NOTE — Progress Notes (Signed)
Physical Therapy - Wound Therapy  Treatment   Patient Details  Name: Bridget Ball MRN: [id number] Date of Birth: Jul 21, 1928 Today's Date: 02/09/2013 Time: 9323-5573 Time Calculation (min): 38 min Visit#: 33 of 38  Re-eval: 02/28/13 Charges:  Deb<20cm  Subjective Subjective Assessment Subjective: Pt states her Rt distal LE has been hurting.  States her Lt continues to itch.   Wound Therapy Wound 11/13/12 Other (Comment) Leg Right;Medial;Lower (Active)  Site / Wound Assessment Red 02/09/2013  1:55 PM  % Wound base Red or Granulating 100% 02/09/2013  1:55 PM  % Wound base Yellow 0% 02/09/2013  1:55 PM  % Wound base Black 0% 02/09/2013  1:55 PM  % Wound base Other (Comment) 0% 02/09/2013  1:55 PM  Peri-wound Assessment Intact 01/03/2013  5:00 PM  Wound Length (cm) 0 cm 01/03/2013  5:00 PM  Wound Width (cm) 0 cm 01/03/2013  5:00 PM  Wound Depth (cm) 0 cm 01/03/2013  5:00 PM  Margins Attached edges (approximated) 02/09/2013  1:55 PM  Closure Approximated 02/09/2013  1:55 PM  Drainage Amount Scant 02/09/2013  1:55 PM  Drainage Description Serous 02/09/2013  1:55 PM  Non-staged Wound Description Not applicable 02/07/252  2:70 PM  Treatment Cleansed;Debridement (Selective) 02/09/2013  1:55 PM  Dressing Type Compression wrap;ABD;Other (Comment) 02/09/2013  1:55 PM  Dressing Changed New 02/09/2013  1:55 PM  Dressing Status Clean;Dry;Intact 02/09/2013  1:55 PM     Wound 11/13/12 Other (Comment) Leg Left;Lower (Active)  Site / Wound Assessment Red;Yellow 02/09/2013  1:55 PM  % Wound base Red or Granulating 95% 02/09/2013  1:55 PM  % Wound base Yellow 5% 02/09/2013  1:55 PM  % Wound base Black 0% 02/09/2013  1:55 PM  % Wound base Other (Comment) 0% 02/09/2013  1:55 PM  Peri-wound Assessment Intact 02/09/2013  1:55 PM  Wound Length (cm) 6 cm 01/31/2013  3:30 PM  Wound Width (cm) 2 cm 01/31/2013  3:30 PM  Wound Depth (cm) 0.1 cm 01/31/2013  3:30 PM  Margins Attached edges (approximated) 02/09/2013  1:55 PM  Closure None  02/09/2013  1:55 PM  Drainage Amount Scant 02/09/2013  1:55 PM  Drainage Description Serous 02/09/2013  1:55 PM  Non-staged Wound Description Not applicable 06/06/3760  8:31 PM  Treatment Cleansed;Debridement (Selective) 02/09/2013  1:55 PM  Dressing Type Impregnated gauze (bismuth) 02/09/2013  1:55 PM  Dressing Changed New 02/09/2013  1:55 PM  Dressing Status Clean;Dry;Intact 02/07/2013  2:07 PM   Selective Debridement Selective Debridement - Location: marbling throughout wound Selective Debridement - Tools Used: Forceps;Scalpel Selective Debridement - Tissue Removed: slough and deadskin perimeter of wound on Lt LE   Physical Therapy Assessment and Plan Wound Therapy - Assess/Plan/Recommendations Wound Therapy - Clinical Statement: Increased approximation of borders since change to xeroform dressing.  Less hypergranulation and slough noted within wound bed.  Overall improvement.   Wound Plan: Continue with wound care for Lt LE, D/C wound care to Rt LE and balance training       Problem List Patient Active Problem List   Diagnosis Date Noted  . SAH (subarachnoid hemorrhage) 11/22/2012  . Seizures secondary to [facility] 11/22/2012  . Bilateral lower leg cellulitis 11/13/2012  . Open wound of leg 11/13/2012  . Oral thrush 11/13/2012     Teena Irani, PTA/CLT 02/09/2013, 2:06 PM

## 2013-02-10 ENCOUNTER — Ambulatory Visit
Admission: RE | Admit: 2013-02-10 | Discharge: 2013-02-10 | Disposition: A | Discharge status: Home / Self Care | Payer: Medicare Other | Source: Ambulatory Visit | Attending: Neurology | Admitting: Neurology

## 2013-02-10 ENCOUNTER — Other Ambulatory Visit: Payer: Medicare Other

## 2013-02-10 DIAGNOSIS — R569 Unspecified convulsions: Secondary | ICD-10-CM

## 2013-02-10 DIAGNOSIS — R413 Other amnesia: Secondary | ICD-10-CM

## 2013-02-10 MED ORDER — GADOBENATE DIMEGLUMINE 529 MG/ML IV SOLN
10.0000 mL | Freq: Once | INTRAVENOUS | Status: AC | PRN
  Administered 2013-02-10: 10 mL via INTRAVENOUS

## 2013-02-14 ENCOUNTER — Ambulatory Visit (HOSPITAL_COMMUNITY)
Admission: RE | Admit: 2013-02-14 | Discharge: 2013-02-14 | Disposition: A | Discharge status: Home / Self Care | Payer: Medicare Other | Source: Ambulatory Visit | Attending: Family Medicine | Admitting: Family Medicine

## 2013-02-14 NOTE — Progress Notes (Signed)
Physical Therapy - Wound Therapy  Treatment   Patient Details  Name: MAUDY YONAN MRN: [id number] Date of Birth: [birth year]  Today's Date: 02/14/2013 Time: 1350-1430 Time Calculation (min): 40 min Visit#: 34 of 38  Re-eval: 02/28/13 Charges:  Deb <20cm  Subjective  Subjective: Pt states no pain or difficulties.  Pt comes today with her son.   Wound Therapy Wound 11/13/12 Other (Comment) Leg Right;Medial;Lower (Active)  Dressing Type Other (Comment);Impregnated gauze (bismuth) 02/14/2013  5:00 PM  Dressing Changed New 02/14/2013  5:00 PM  Dressing Status Clean;Dry;Intact 02/14/2013  5:00 PM  Site / Wound Assessment Red 02/14/2013  5:00 PM  % Wound base Red or Granulating 100% 02/14/2013  5:00 PM  % Wound base Yellow 0% 02/14/2013  5:00 PM  % Wound base Black 0% 02/14/2013  5:00 PM  % Wound base Other (Comment) 0% 02/14/2013  5:00 PM  Peri-wound Assessment Intact 01/03/2013  5:00 PM  Wound Length (cm) 0 cm 01/03/2013  5:00 PM  Wound Width (cm) 0 cm 01/03/2013  5:00 PM  Wound Depth (cm) 0 cm 01/03/2013  5:00 PM  Margins Attached edges (approximated) 02/14/2013  5:00 PM  Closure Approximated 02/14/2013  5:00 PM  Drainage Amount Scant 02/14/2013  5:00 PM  Drainage Description Serous 02/14/2013  5:00 PM  Non-staged Wound Description Not applicable 04/13/8117  1:47 PM  Treatment Cleansed;Debridement (Selective) 02/14/2013  5:00 PM     Wound 11/13/12 Other (Comment) Leg Left;Lower (Active)  Dressing Type Impregnated gauze (bismuth) 02/14/2013  5:00 PM  Dressing Changed New 02/14/2013  5:00 PM  Dressing Status Clean;Dry;Intact 02/14/2013  5:00 PM  Site / Wound Assessment Red;Yellow 02/14/2013  5:00 PM  % Wound base Red or Granulating 95% 02/14/2013  5:00 PM  % Wound base Yellow 5% 02/14/2013  5:00 PM  % Wound base Black 0% 02/14/2013  5:00 PM  % Wound base Other (Comment) 0% 02/14/2013  5:00 PM  Peri-wound Assessment Intact 02/14/2013  5:00 PM  Wound Length (cm) 6 cm 01/31/2013  3:30 PM  Wound  Width (cm) 2 cm 01/31/2013  3:30 PM  Wound Depth (cm) 0.1 cm 01/31/2013  3:30 PM  Margins Attached edges (approximated) 02/14/2013  5:00 PM  Closure None 02/14/2013  5:00 PM  Drainage Amount Scant 02/14/2013  5:00 PM  Drainage Description Serous 02/14/2013  5:00 PM  Non-staged Wound Description Not applicable 09/03/5619  3:08 PM  Treatment Cleansed;Debridement (Selective) 02/14/2013  5:00 PM   Selective Debridement Selective Debridement - Location: marbling throughout wound Selective Debridement - Tools Used: Forceps;Scalpel Selective Debridement - Tissue Removed: slough and deadskin perimeter of wound on Lt LE   Physical Therapy Assessment and Plan Wound Therapy - Assess/Plan/Recommendations Wound Plan: Continue with wound care for B LE PT Assessment and Plan Clinical Impression Statement: Minimal size to Rt LE wound, very little debridment needed.  Anticpate full healing by next week.  Lt LE wound continues to decrease in size with overall reduction of slough present.  No redness present perimeter of wound.   PT Plan: continue present woundcare.  Pt may need to bring compression hose for Lt LE next week to use over dressing if wound continues to decrease in size.     Problem List Patient Active Problem List   Diagnosis Date Noted  . SAH (subarachnoid hemorrhage) 11/22/2012  . Seizures secondary to [facility] 11/22/2012  . Bilateral lower leg cellulitis 11/13/2012  . Open wound of leg 11/13/2012  . Oral thrush 11/13/2012  Teena Irani, PTA/CLT 02/14/2013, 5:22 PM

## 2013-02-15 ENCOUNTER — Telehealth: Payer: Self-pay | Admitting: Neurology

## 2013-02-15 NOTE — Telephone Encounter (Signed)
Patient's daughter calling to request EEG and MRI results.

## 2013-02-15 NOTE — Telephone Encounter (Signed)
Patient's daughter requesting MRI and EEG results. Please advise.

## 2013-02-16 ENCOUNTER — Ambulatory Visit (HOSPITAL_COMMUNITY)
Admission: RE | Admit: 2013-02-16 | Discharge: 2013-02-16 | Disposition: A | Discharge status: Home / Self Care | Payer: Medicare Other | Source: Ambulatory Visit | Attending: Family Medicine | Admitting: Family Medicine

## 2013-02-16 ENCOUNTER — Ambulatory Visit (HOSPITAL_COMMUNITY): Payer: Medicare Other

## 2013-02-16 NOTE — Progress Notes (Signed)
Physical Therapy - Wound Therapy  Treatment   Patient Details  Name: Bridget Ball MRN: [id number] Date of Birth: Dec 08, 1928  Today's Date: 02/16/2013 Time: 1020-1043 Time Calculation (min): 23 min Charge: self care 1020-1043  Visit#: 35 of 38  Re-eval: 02/28/13  Subjective Subjective Assessment Subjective: Pt reports she is excited to see how wound looks.  No current pain today.  Compliant wtih compression hose on Rt LE.  Pain Assessment Pain Assessment Pain Assessment: No/denies pain  Wound Therapy  02/16/13 1500  Subjective Assessment  Subjective Pt reports she is excited to see how wound looks.  No current pain today.  Compliant wtih compression hose on Rt LE.  Wound / Incision (Open or Dehisced)  Date First Assessed/Time First Assessed: 11/13/12 0425   Wound Type: Other (Comment)  Location: Leg  Location Orientation: Left;Lower  Present on Admission: Yes  Dressing Type Impregnated gauze (bismuth)  Dressing Status Clean;Dry;Intact  Site / Wound Assessment Red  % Wound base Red or Granulating (98%)  % Wound base Yellow (2%)  % Wound base Black 0%  % Wound base Other (Comment) 0%  Peri-wound Assessment Intact  Margins Attached edges (approximated)  Closure None  Drainage Amount None  Non-staged Wound Description Not applicable  Selective Debridement  Selective Debridement - Location Removal of dryskin perimeter of wound to promote healing  Selective Debridement - Tools Used Forceps  Selective Debridement - Tissue Removed deadskin perimeter of wound  Wound Therapy - Assess/Plan/Recommendations  Wound Therapy - Clinical Statement Minimal debridement required this session for removal of dryskin perimeter of wound.  Increased approximation of border since change to xeroform.  Wound Plan Continue with wound care for Lt LE   Selective Debridement Selective Debridement - Location: Removal of dryskin perimeter of wound to promote healing Selective Debridement - Tools  Used: Forceps Selective Debridement - Tissue Removed: deadskin perimeter of wound   Physical Therapy Assessment and Plan Wound Therapy - Assess/Plan/Recommendations Wound Therapy - Clinical Statement: Minimal debridement required this session for removal of dryskin perimeter of wound.  Increased approximation of border since change to xeroform. Wound Plan: Continue with wound care for Lt LE      Goals    Problem List Patient Active Problem List   Diagnosis Date Noted  . SAH (subarachnoid hemorrhage) 11/22/2012  . Seizures secondary to [facility] 11/22/2012  . Bilateral lower leg cellulitis 11/13/2012  . Open wound of leg 11/13/2012  . Oral thrush 11/13/2012    GP    Aldona Lento 02/16/2013, 3:04 PM

## 2013-02-20 ENCOUNTER — Ambulatory Visit (HOSPITAL_COMMUNITY)
Admission: RE | Admit: 2013-02-20 | Discharge: 2013-02-20 | Disposition: A | Discharge status: Home / Self Care | Payer: Medicare Other | Source: Ambulatory Visit | Attending: Family Medicine | Admitting: Family Medicine

## 2013-02-20 NOTE — Progress Notes (Signed)
Physical Therapy - Wound Therapy  Treatment   Patient Details  Name: Bridget Ball MRN: [id number] Date of Birth: [birth year]  Today's Date: 02/20/2013 Time: 0017-4944 Time Calculation (min): 40 min Charge  Selective debridement <20 cm  Visit#: 36 of 38  Re-eval: 02/28/13  Subjective Subjective Assessment Subjective: Feeling good, no pain.  Pt wearing compressoin hose on Rt LE.  Pain Assessment Pain Assessment Pain Assessment: No/denies pain  Wound Therapy  02/20/13 1546  Subjective Assessment  Subjective Feeling good, no pain.  Pt wearing compressoin hose on Rt LE.  Wound / Incision (Open or Dehisced)  Date First Assessed/Time First Assessed: 11/13/12 0425   Wound Type: Other (Comment)  Location: Leg  Location Orientation: Left;Lower  Present on Admission: Yes  Dressing Type Impregnated gauze (bismuth)  Dressing Changed New  Dressing Status Clean;Dry;Intact  Site / Wound Assessment Red  % Wound base Red or Granulating (98%)  % Wound base Yellow (2%)  % Wound base Black 0%  % Wound base Other (Comment) 0%  Peri-wound Assessment Intact  Margins Attached edges (approximated)  Closure None  Drainage Amount Scant  Drainage Description Serous  Non-staged Wound Description Not applicable  Treatment Cleansed;Debridement (Selective)  Selective Debridement  Selective Debridement - Location Removal of marbling slough and dryskin perimeter of wound to promote healing  Selective Debridement - Tools Used Forceps  Selective Debridement - Tissue Removed deadskin perimeter of wound  Wound Therapy - Assess/Plan/Recommendations  Wound Therapy - Clinical Statement Removal of marbeling slough and dryskin perimeter of wound.  Noted increased granulation proximal wound with 2% slough remaining following debridement.  Continues with xeroform with vaseline perimeter of wound and compression wrap.    Wound Plan Continue with wound care for Lt LE    Selective Debridement Selective  Debridement - Location: Removal of marbling slough and dryskin perimeter of wound to promote healing Selective Debridement - Tools Used: Forceps Selective Debridement - Tissue Removed: deadskin perimeter of wound   Physical Therapy Assessment and Plan Wound Therapy - Assess/Plan/Recommendations Wound Therapy - Clinical Statement: Removal of marbeling slough and dryskin perimeter of wound.  Noted increased granulation proximal wound with 2% slough remaining following debridement.  Continues with xeroform with vaseline perimeter of wound and compression wrap.   Wound Plan: Continue with wound care for Lt LE      Goals    Problem List Patient Active Problem List   Diagnosis Date Noted  . SAH (subarachnoid hemorrhage) 11/22/2012  . Seizures secondary to [facility] 11/22/2012  . Bilateral lower leg cellulitis 11/13/2012  . Open wound of leg 11/13/2012  . Oral thrush 11/13/2012    GP    Aldona Lento 02/20/2013, 3:51 PM

## 2013-02-21 ENCOUNTER — Ambulatory Visit (HOSPITAL_COMMUNITY): Payer: Medicare Other

## 2013-02-21 NOTE — Telephone Encounter (Signed)
Spoke to daughter and explained results. She is doing better on depression medicines.

## 2013-02-23 ENCOUNTER — Ambulatory Visit (HOSPITAL_COMMUNITY)
Admission: RE | Admit: 2013-02-23 | Discharge: 2013-02-23 | Disposition: A | Discharge status: Home / Self Care | Payer: Medicare Other | Source: Ambulatory Visit | Attending: Family Medicine | Admitting: Family Medicine

## 2013-02-23 NOTE — Progress Notes (Signed)
Physical Therapy - Wound Therapy  Treatment   Patient Details  Name: Bridget Ball MRN: [id number] Date of Birth: 1928-03-19  Today's Date: 02/23/2013 Time: 1601-0932 Time Calculation (min): 28 min Charge selective debridement <20 cm  Visit#: 37 of 38  Re-eval: 02/28/13  Subjective Subjective Assessment Subjective: No pain, have been "patting" my leg when it itches  Pain Assessment Pain Assessment Pain Assessment: No/denies pain  Wound Therapy  02/23/13 1511  Subjective Assessment  Subjective No pain, have been "patting" my leg when it itches  Wound / Incision (Open or Dehisced)  Date First Assessed/Time First Assessed: 11/13/12 0425   Wound Type: Other (Comment)  Location: Leg  Location Orientation: Left;Lower  Present on Admission: Yes  Dressing Type Impregnated gauze (bismuth)  Dressing Changed New  Dressing Status Clean;Dry;Intact  Site / Wound Assessment Red  % Wound base Red or Granulating (98%)  % Wound base Yellow (2%)  % Wound base Black 0%  % Wound base Other (Comment) 0%  Peri-wound Assessment Intact  Margins Attached edges (approximated)  Closure None  Drainage Amount None  Non-staged Wound Description Not applicable  Treatment Cleansed;Debridement (Selective)  Selective Debridement  Selective Debridement - Location Removal of marbling slough and dryskin perimeter of wound to promote healing  Selective Debridement - Tools Used Forceps  Selective Debridement - Tissue Removed deadskin perimeter of wound  Wound Therapy - Assess/Plan/Recommendations  Wound Therapy - Clinical Statement Removed dry skin wound perimeter and marbeling slough with 98% granulation following. Continued with xerofrom, vaseline wound perimeter and compression.  No c/o pain through session.   Wound Plan Continue with wound care for Lt LE      Selective Debridement Selective Debridement - Location: Removal of marbling slough and dryskin perimeter of wound to promote  healing Selective Debridement - Tools Used: Forceps Selective Debridement - Tissue Removed: deadskin perimeter of wound   Physical Therapy Assessment and Plan Wound Therapy - Assess/Plan/Recommendations Wound Therapy - Clinical Statement: Removed dry skin wound perimeter and marbeling slough with 98% granulation following. Continued with xerofrom, vaseline wound perimeter and compression.  No c/o pain through session.  Wound Plan: Continue with wound care for Lt LE      Goals    Problem List Patient Active Problem List   Diagnosis Date Noted  . SAH (subarachnoid hemorrhage) 11/22/2012  . Seizures secondary to [facility] 11/22/2012  . Bilateral lower leg cellulitis 11/13/2012  . Open wound of leg 11/13/2012  . Oral thrush 11/13/2012    GP    Aldona Lento 02/23/2013, 3:17 PM

## 2013-02-28 ENCOUNTER — Ambulatory Visit (HOSPITAL_COMMUNITY)
Admission: RE | Admit: 2013-02-28 | Discharge: 2013-02-28 | Disposition: A | Discharge status: Home / Self Care | Payer: Medicare Other | Source: Ambulatory Visit | Attending: Family Medicine | Admitting: Family Medicine

## 2013-02-28 NOTE — Progress Notes (Signed)
Physical Therapy - Wound Therapy  Treatment   Patient Details  Name: Bridget Ball MRN: [id number] Date of Birth: 03-30-1928  Today's Date: 02/28/2013 Time: 8592-9244 Time Calculation (min): 35 min  Visit#: 38 of 46  Re-eval: 03/28/13  Subjective Subjective Assessment Subjective: Pt reported Rt LE scabs are fully healed, No pain has been patting when it itches.  Pain Assessment Pain Assessment Pain Assessment: No/denies pain  Wound Therapy  02/28/13 1510  Subjective Assessment  Subjective Pt reported Rt LE scabs are fully healed, No pain has been patting when it itches.  Wound / Incision (Open or Dehisced)  Date First Assessed/Time First Assessed: 11/13/12 0425   Wound Type: Other (Comment)  Location: Leg  Location Orientation: Left;Lower  Present on Admission: Yes  Dressing Type Impregnated gauze (bismuth)  Dressing Changed New  Dressing Status Clean;Dry;Intact  Site / Wound Assessment Red;Granulation tissue  % Wound base Red or Granulating (98%) was 95%  % Wound base Yellow (2%) was 5 %  % Wound base Black 0%  % Wound base Other (Comment) 0%  Peri-wound Assessment Intact  Wound Length (cm) 4.8 cm  Was 7.5  Wound Width (cm) 1 cm  Was 3.0  Wound Depth (cm) 0 cm was .3  Margins Attached edges (approximated)  Closure None  Drainage Amount None  Non-staged Wound Description Not applicable  Treatment Cleansed;Debridement (Selective)  Selective Debridement  Selective Debridement - Location Removal of dry slough and dryskin.  Selective Debridement - Tools Used Forceps;Scalpel  Selective Debridement - Tissue Removed slough and deadskin perimeter of wound  Wound Therapy - Assess/Plan/Recommendations  Wound Therapy - Clinical Statement Removal of dry slough and dry skin wound perimeter.  Measurements complete with wound decreasing in length, width and no depth remaining.  Continued with xeroform, vaseline wound perimeter and compression hose for edema.  Wound Plan Recommend   continuing wound care for Lt LE.  Wound progressing well; next session change to xeroform with medipore tape and pt.'s compression hose.      Selective Debridement Selective Debridement - Location: Removal of dry slough and dryskin. Selective Debridement - Tools Used: Forceps;Scalpel Selective Debridement - Tissue Removed: slough and deadskin perimeter of wound   Physical Therapy Assessment and Plan Wound Therapy - Assess/Plan/Recommendations Wound Therapy - Clinical Statement: Removal of dry slough and dry skin wound perimeter.  Measurements complete with wound decreasing in length, width and no depth remaining.  Continued with xeroform, vaseline wound perimeter and compression hose for edema. Wound Plan: Recommend  continuing wound care for Lt LE.  Wound progressing well; next session change to xeroform with medipore tape and pt.'s compression hose.        Goals Wound Therapy Goals - Improve the function of patient's integumentary system by progressing the wound(s) through the phases of wound healing by: Decrease Necrotic Tissue - Progress: Progressing toward goal Increase Granulation Tissue - Progress: Progressing toward goal Decrease Length/Width/Depth - Progress: Met Improve Drainage Characteristics - Progress: Met Patient/Family Instruction Goal - Progress: Progressing toward goal  Problem List Patient Active Problem List   Diagnosis Date Noted  . SAH (subarachnoid hemorrhage) 11/22/2012  . Seizures secondary to [facility] 11/22/2012  . Bilateral lower leg cellulitis 11/13/2012  . Open wound of leg 11/13/2012  . Oral thrush 11/13/2012    GP    Aldona Lento 02/28/2013, 3:25 PM

## 2013-03-02 ENCOUNTER — Ambulatory Visit (HOSPITAL_COMMUNITY): Payer: Medicare Other

## 2013-03-05 NOTE — Progress Notes (Signed)
Agree with treatment plan.

## 2013-03-07 ENCOUNTER — Ambulatory Visit (HOSPITAL_COMMUNITY)
Admission: RE | Admit: 2013-03-07 | Discharge: 2013-03-07 | Disposition: A | Discharge status: Home / Self Care | Payer: Medicare Other | Source: Ambulatory Visit | Attending: Family Medicine | Admitting: Family Medicine

## 2013-03-07 DIAGNOSIS — L02419 Cutaneous abscess of limb, unspecified: Secondary | ICD-10-CM | POA: Insufficient documentation

## 2013-03-07 DIAGNOSIS — IMO0001 Reserved for inherently not codable concepts without codable children: Secondary | ICD-10-CM | POA: Insufficient documentation

## 2013-03-07 DIAGNOSIS — L03119 Cellulitis of unspecified part of limb: Secondary | ICD-10-CM

## 2013-03-07 NOTE — Progress Notes (Signed)
Physical Therapy - Wound Therapy  Treatment   Patient Details  Name: Bridget Ball MRN: [id number] Date of Birth: 1928-11-06  Today's Date: 03/07/2013 Time: 2355-7322 Time Calculation (min): 38 min Charge ; selective debridement <20 cm  Visit#: 39 of 46  Re-eval: 03/28/13  Subjective Subjective Assessment Subjective: Lt LE is feeling good today, no reports of pain.  Dressings have rolled down leg, still intact.    Pain Assessment Pain Assessment Pain Assessment: No/denies pain  Wound Therapy  03/07/13 1442  Subjective Assessment  Subjective Lt LE is feeling good today, no reports of pain.  Dressings have rolled down leg, still intact.    Wound / Incision (Open or Dehisced)  Date First Assessed/Time First Assessed: 11/13/12 0425   Wound Type: Other (Comment)  Location: Leg  Location Orientation: Left;Lower  Present on Admission: Yes  Dressing Type Impregnated gauze (bismuth)  Dressing Changed New  Dressing Status Clean;Dry;Intact  Site / Wound Assessment Red;Granulation tissue  % Wound base Red or Granulating (98%)  % Wound base Yellow (2%)  % Wound base Black 0%  % Wound base Other (Comment) 0%  Peri-wound Assessment Intact  Margins Attached edges (approximated)  Closure None  Drainage Amount None  Non-staged Wound Description Not applicable  Treatment Cleansed;Debridement (Selective)  Selective Debridement  Selective Debridement - Location Removal of dry slough and dryskin.  Selective Debridement - Tools Used Forceps;Scalpel  Selective Debridement - Tissue Removed slough and deadskin perimeter of wound  Wound Therapy - Assess/Plan/Recommendations  Wound Therapy - Clinical Statement Wound continues to improve in granualtion with minimal devridement required this session.  Continues with xeroform, vaseline wound perimeter, gauze and compression hose for edema control.  Decrease in freuency to 1x week due to wound progress.    Wound Plan Continue wound care to Lt LE 1x  week per PT     Selective Debridement Selective Debridement - Location: Removal of dry slough and dryskin. Selective Debridement - Tools Used: Forceps;Scalpel Selective Debridement - Tissue Removed: slough and deadskin perimeter of wound   Physical Therapy Assessment and Plan Wound Therapy - Assess/Plan/Recommendations Wound Therapy - Clinical Statement: Wound continues to improve in granualtion with minimal devridement required this session.  Continues with xeroform, vaseline wound perimeter, gauze and compression hose for edema control.  Decrease in freuency to 1x week due to wound progress.   Wound Plan: Continue wound care to Lt LE 1x week per PT       Goals  Goal:  granualtion 100% Progressing  Goal:  Wound to be healed met   Problem List Patient Active Problem List   Diagnosis Date Noted  . SAH (subarachnoid hemorrhage) 11/22/2012  . Seizures secondary to [facility] 11/22/2012  . Bilateral lower leg cellulitis 11/13/2012  . Open wound of leg 11/13/2012  . Oral thrush 11/13/2012    GP    Ihor Austin Jo/Cindy Russell PT  03/07/2013, 2:46 PM  Your signature is required to indicate approval of the treatment plan as stated above.  Please sign and return making a copy for your files.  You may hard copy or send electronically.  Please check one: ___1.  Approve of this plan  ___2.  Approve of this plan with the following changes.   ____________________________                             _____________ Physician  Date    

## 2013-03-09 ENCOUNTER — Ambulatory Visit (HOSPITAL_COMMUNITY): Payer: Medicare Other

## 2013-03-10 NOTE — Progress Notes (Signed)
Agree with treatment plan.

## 2013-03-14 ENCOUNTER — Ambulatory Visit (HOSPITAL_COMMUNITY)
Admission: RE | Admit: 2013-03-14 | Discharge: 2013-03-14 | Disposition: A | Discharge status: Home / Self Care | Payer: Medicare Other | Source: Ambulatory Visit | Attending: Family Medicine | Admitting: Family Medicine

## 2013-03-14 NOTE — Progress Notes (Signed)
Physical Therapy - Wound Therapy (G-code update)  Treatment   Patient Details  Name: Bridget Ball MRN: [id number] Date of Birth: [birth year]  Today's Date: 03/14/2013 Time: 9371-6967 Time Calculation (min): 30 min Charges: Selective debridement (= or < 20 cm)   Visit#: 40 of 46  Re-eval: 03/28/13  Subjective Subjective Assessment Subjective: "That wrap slid again."  Pain Assessment Pain Assessment Pain Assessment: No/denies pain  Wound Therapy Wound 11/13/12 Other (Comment) Leg Right;Medial;Lower (Active)  Dressing Type Other (Comment);Impregnated gauze (bismuth) 02/14/2013  5:00 PM  Dressing Changed New 02/14/2013  5:00 PM  Dressing Status Clean;Dry;Intact 02/14/2013  5:00 PM  Site / Wound Assessment Red 02/14/2013  5:00 PM  % Wound base Red or Granulating 100% 02/14/2013  5:00 PM  % Wound base Yellow 0% 02/14/2013  5:00 PM  % Wound base Black 0% 02/14/2013  5:00 PM  % Wound base Other (Comment) 0% 02/14/2013  5:00 PM  Peri-wound Assessment Intact 01/03/2013  5:00 PM  Wound Length (cm) 0 cm 01/03/2013  5:00 PM  Wound Width (cm) 0 cm 01/03/2013  5:00 PM  Wound Depth (cm) 0 cm 01/03/2013  5:00 PM  Margins Attached edges (approximated) 02/14/2013  5:00 PM  Closure Approximated 02/14/2013  5:00 PM  Drainage Amount Scant 02/14/2013  5:00 PM  Drainage Description Serous 02/14/2013  5:00 PM  Non-staged Wound Description Not applicable 8/93/8101  7:51 PM  Treatment Cleansed;Debridement (Selective) 02/14/2013  5:00 PM     Wound 11/13/12 Other (Comment) Leg Left;Lower (Active)  Dressing Type Impregnated gauze (bismuth);Tape dressing 03/14/2013  6:08 PM  Dressing Changed New 03/14/2013  6:08 PM  Dressing Status Clean;Dry;Intact 03/14/2013  6:08 PM  Site / Wound Assessment Red;Granulation tissue 03/14/2013  6:08 PM  % Wound base Red or Granulating 100% 03/14/2013  6:08 PM  % Wound base Yellow 0% 03/14/2013  6:08 PM  % Wound base Black 0% 03/14/2013  6:08 PM  % Wound base Other (Comment) 0%  03/14/2013  6:08 PM  Peri-wound Assessment Intact 03/14/2013  6:08 PM  Wound Length (cm) 4.8 cm 02/28/2013  3:10 PM  Wound Width (cm) 1 cm 02/28/2013  3:10 PM  Wound Depth (cm) 0 cm 02/28/2013  3:10 PM  Margins Attached edges (approximated) 03/14/2013  6:08 PM  Closure None 03/14/2013  6:08 PM  Drainage Amount None 03/14/2013  6:08 PM  Drainage Description Serous 02/20/2013  3:46 PM  Non-staged Wound Description Not applicable 0/25/8527  7:82 PM  Treatment Cleansed;Debridement (Selective) 03/14/2013  6:08 PM   Selective Debridement Selective Debridement - Location: Left lower medial leg Selective Debridement - Tools Used: Forceps;Scalpel Selective Debridement - Tissue Removed: slough and dead skin   Physical Therapy Assessment and Plan Wound Therapy - Assess/Plan/Recommendations Wound Therapy - Clinical Statement: Wound continues to approximate well. Wound was dressed with xeroform, covered with folded 4x4 and anchored with medipore. Pt's compressing stocking was donned over dressing. Pt tolerated treatment well. Wound Plan: Continue wound care to Lt LE 1x week per PT   Problem List Patient Active Problem List   Diagnosis Date Noted  . SAH (subarachnoid hemorrhage) 11/22/2012  . Seizures secondary to [facility] 11/22/2012  . Bilateral lower leg cellulitis 11/13/2012  . Open wound of leg 11/13/2012  . Oral thrush 11/13/2012    GP Functional Assessment Tool Used: clinical judgement Functional Limitation: Other PT primary Other PT Primary Current Status (U2353): At least 1 percent but less than 20 percent impaired, limited or restricted Other PT Primary Goal Status (I1443): At least  1 percent but less than 20 percent impaired, limited or restricted  Rachelle Hora, PTA 03/14/2013, 6:12 PM

## 2013-03-16 ENCOUNTER — Ambulatory Visit (HOSPITAL_COMMUNITY): Payer: Medicare Other

## 2013-03-21 ENCOUNTER — Ambulatory Visit (HOSPITAL_COMMUNITY)
Admission: RE | Admit: 2013-03-21 | Discharge: 2013-03-21 | Disposition: A | Discharge status: Home / Self Care | Payer: Medicare Other | Source: Ambulatory Visit | Attending: Family Medicine | Admitting: Family Medicine

## 2013-03-21 NOTE — Progress Notes (Signed)
Physical Therapy - Wound Therapy Discharge  Re-Evaluation   Patient Details  Name: Twania S Adell MRN: [id number] Date of Birth: 01/03/1929  Today's Date: 03/21/2013 Time: 1438-1500 Time Calculation (min): 22 min Visit#: 41 of 46  Re-eval: 03/28/13 Charges:  Self care 20'  Subjective Subjective Assessment Subjective: Pt states no problems with the bandage using her compression garment over it.  States no pain.  Pain Assessment Pain Assessment Pain Assessment: No/denies pain  Wound Therapy Wound 11/13/12 Other (Comment) Leg Left;Lower (Active)  Dressing Type None 03/21/2013  3:32 PM  Dressing Changed Not applicable 03/14/2013  6:08 PM  Dressing Status Not applicable 03/14/2013  6:08 PM  Site / Wound Assessment Clean;Dry 03/21/2013  3:32 PM  % Wound base Red or Granulating 100% 03/21/2013  3:32 PM  % Wound base Yellow 0% 03/21/2013  3:32 PM  % Wound base Black 0% 03/21/2013  3:32 PM  % Wound base Other (Comment) 0% 03/21/2013  3:32 PM  Peri-wound Assessment Intact 03/21/2013  3:32 PM  Wound Length (cm) 4.8 cm 02/28/2013  3:10 PM  Wound Width (cm) 1 cm 02/28/2013  3:10 PM  Wound Depth (cm) 0 cm 02/28/2013  3:10 PM  Margins Attached edges (approximated) 03/21/2013  3:32 PM  Closure Approximated 03/21/2013  3:32 PM  Drainage Amount None 03/21/2013  3:32 PM  Drainage Description Serous 02/20/2013  3:46 PM  Non-staged Wound Description Not applicable 03/21/2013  3:32 PM  Treatment Other (Comment) 03/21/2013  3:32 PM   Selective Debridement Selective Debridement - Location: Left lower medial leg Selective Debridement - Tools Used: Forceps Selective Debridement - Tissue Removed: dead skin   Physical Therapy Assessment and Plan Wound Therapy - Assess/Plan/Recommendations Wound Therapy - Clinical Statement: Debrided dry skin and scab from residual wound.  No openings, irritation or drainage present.  Wound is fully approximated.  No further skilled wound care need.   Pt instructed to gently  cleanse area and apply lotion at night to dry skin.  Encouraged to continue wearing compression garments daily. Wound Plan: Discharge; all goals met and no further skilled need.      Goals Wound Therapy Goals - Improve the function of patient's integumentary system by progressing the wound(s) through the phases of wound healing by: 1.  Decrease Necrotic Tissue to: 0% - Progress: Met 2.  Increase Granulation Tissue to: 100% - Progress: Met 3.  Decrease Length/Width/Depth by (cm): 50%- Progress: Met 4.  Improve Drainage Characteristics: Min- Progress: Met 5.  Patient/Family will be able to : Independent with dressing wounds- Progress: Met  Problem List Patient Active Problem List   Diagnosis Date Noted  . SAH (subarachnoid hemorrhage) 11/22/2012  . Seizures secondary to SAH 11/22/2012  . Bilateral lower leg cellulitis 11/13/2012  . Open wound of leg 11/13/2012  . Oral thrush 11/13/2012    GP Functional Assessment Tool Used: clinical judgement Functional Limitation: Other PT primary Other PT Primary Goal Status (G8991): At least 1 percent but less than 20 percent impaired, limited or restricted Other PT Primary Discharge Status (G8992): At least 1 percent but less than 20 percent impaired, limited or restricted  Amy B Frazier, PTA/CLT 03/21/2013, 3:41 PM Your signature is required to indicate approval of the treatment plan as stated above.  Please sign and return making a copy for your files.  You may hard copy or send electronically.  Please check one: ___1.  Approve of this plan  ___2.  Approve of this plan with the following changes.   ____________________________                               _____________ Physician                                                                      Date

## 2013-03-24 NOTE — Progress Notes (Signed)
Agree with treatment plan.

## 2013-03-27 ENCOUNTER — Ambulatory Visit (HOSPITAL_COMMUNITY)
Admission: RE | Admit: 2013-03-27 | Discharge: 2013-03-27 | Disposition: A | Discharge status: Home / Self Care | Payer: Medicare Other | Source: Ambulatory Visit | Attending: Family Medicine | Admitting: Family Medicine

## 2013-03-27 ENCOUNTER — Telehealth (HOSPITAL_COMMUNITY): Payer: Self-pay | Admitting: *Deleted

## 2013-03-30 ENCOUNTER — Encounter (INDEPENDENT_AMBULATORY_CARE_PROVIDER_SITE_OTHER): Payer: Self-pay | Admitting: *Deleted

## 2013-04-20 ENCOUNTER — Encounter (INDEPENDENT_AMBULATORY_CARE_PROVIDER_SITE_OTHER): Payer: Self-pay | Admitting: Internal Medicine

## 2013-04-20 ENCOUNTER — Other Ambulatory Visit (INDEPENDENT_AMBULATORY_CARE_PROVIDER_SITE_OTHER): Payer: Self-pay | Admitting: *Deleted

## 2013-04-20 ENCOUNTER — Ambulatory Visit (INDEPENDENT_AMBULATORY_CARE_PROVIDER_SITE_OTHER): Payer: Medicare Other | Admitting: Internal Medicine

## 2013-04-20 ENCOUNTER — Telehealth (INDEPENDENT_AMBULATORY_CARE_PROVIDER_SITE_OTHER): Payer: Self-pay | Admitting: *Deleted

## 2013-04-20 VITALS — BP 102/50 | HR 80 | Temp 97.8°F | Ht 63.0 in | Wt 105.5 lb

## 2013-04-20 DIAGNOSIS — K921 Melena: Secondary | ICD-10-CM

## 2013-04-20 DIAGNOSIS — R197 Diarrhea, unspecified: Secondary | ICD-10-CM

## 2013-04-20 DIAGNOSIS — R634 Abnormal weight loss: Secondary | ICD-10-CM

## 2013-04-20 DIAGNOSIS — Z1211 Encounter for screening for malignant neoplasm of colon: Secondary | ICD-10-CM

## 2013-04-20 NOTE — Patient Instructions (Signed)
Imodium one in the am and one in the evening. Increase fiber in diet.

## 2013-04-20 NOTE — Telephone Encounter (Signed)
Patient needs movi prep 

## 2013-04-20 NOTE — Progress Notes (Signed)
Subjective:     Patient ID: Bridget Ball, female   DOB: October 30, 1928, 80 y.o.   MRN: [id number]  HPI Referred to our office by [facility] for chronic diarrhea. She has had the diarrhea for over a month. She is having 5-7 stools a day. She says her stools are sometimes black and sometimes they are brown. She has not taken any Pepto Bismol recently.  She says she will have black stools and then the 3rd or 4th stool will be brown.  Her last black stool was 3-4 days ago.  She gets up several times during the night to have a BM. She did have one formed stool last week. She says all of her stools are watery. No recent antibioties. She has lost approximately 15 pounds since January. Appetite is not good. She says she is hungry. Foods just do not taste good.  Occasional acid reflux No abdominal pain.  No NSAIDs  08/10/2007 EGD Dr. Gala Romney: INDICATIONS FOR PROCEDURE: A 80 year old lady who had recent upper  abdominal pain in the setting of azithromycin and Naprosyn use. She has  had 3-week history of black tarry stools. IMPRESSION: Normal esophagus, small hiatal hernia, knobby nodular-  appearing gastric mucosa body and fundus of stress significance status  post biopsy antral erosions. No ulcer or infiltrating process seen.  Patent pylorus. Normal D1 and D2.     03/01/2006 Colonoscopy for chronic diarrhea: She has never undergone a colonoscopy in the past.  FINAL DIAGNOSIS: Redundant, but normal colonoscopy except small cecum.  FINAL DIAGNOSIS:  1. No evidence of colitis.  2. 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  3. External hemorrhoids.  4. Random biopsies taken from sigmoid colon looking for microscopic  and/or collagenous colitis.  5. If biopsies are normal, would consider therapy for irritable bowel  syndrome; and if she does not respond, will need further evaluation  with attention to small bowel. Biopsy:   SIGMOID COLON, BIOPSIES: FRAGMENTS OF BENIGN COLONIC  MUCOSA WITH UNDERLYING LYMPHOID AGGREGATES.  COMMENT There is colorectal mucosa with normal crypt architecture and no objective increase in inflammation. No active inflammation, microscopic colitis, collagenous colitis or significant chronic changes identified. No hyperplastic or adenomatous changes are seen, and there is no evidence of malignancy. (MS:cdc:03/02/06)    Review of Systems Past Medical History  Diagnosis Date  . COPD (chronic obstructive pulmonary disease)   . Arthritis   . Stroke     TIA four years ago  . Seizures     x 2 last year    Past Surgical History  Procedure Laterality Date  . Appendectomy    . Skin cancer excision    . Tubal ligation      Allergies  Allergen Reactions  . Penicillins Anaphylaxis    Current Outpatient Prescriptions on File Prior to Visit  Medication Sig Dispense Refill  . citalopram (CELEXA) 10 MG tablet Take 1 tablet (10 mg total) by mouth daily.  60 tablet  1  . HYDROmorphone (DILAUDID) 2 MG tablet Take 4 mg by mouth every 6 (six) hours as needed for severe pain.      Marland Kitchen levETIRAcetam (KEPPRA) 1000 MG tablet Take 1 tablet (1,000 mg total) by mouth 2 (two) times daily.  60 tablet  2  . simvastatin (ZOCOR) 40 MG tablet Take 40 mg by mouth daily. 1/2 tab      . thiamine 100 MG tablet Take 1 tablet (100 mg total) by mouth daily.  30 tablet  3   No  current facility-administered medications on file prior to visit.        Objective:   Physical Exam  Filed Vitals:   04/20/13 1535  BP: 102/50  Pulse: 80  Temp: 97.8 F (36.6 C)  Height: 5\' 3"  (1.6 m)  Weight: 105 lb 8 oz (47.854 kg)   Alert and oriented. Skin warm and dry. Oral mucosa is moist.   . Sclera anicteric, conjunctivae is pink. Thyroid not enlarged. No cervical lymphadenopathy. Lungs clear. Heart regular rate and rhythm.  Abdomen is soft. Bowel sounds are positive. No hepatomegaly. No abdominal masses felt. No tenderness. Patient is very thin.  No edema to lower  extremities.  Stool brown and guaiac negative.     Assessment:    Diarrhea. An infectious process needs to be ruled. She gives a hx of melena. Will get stools studies from Dr. Nevada Crane. I have asked Charna Busman to get these records.   I discussed this case with Dr. Laural Golden.  Plan:     EGD/Colonoscopy. CBC, Cmet, TSH.    The risks and benefits such as perforation, bleeding, and infection were reviewed with the patient and is agreeable.

## 2013-04-21 LAB — CBC WITH DIFFERENTIAL/PLATELET
BASOS PCT: 0 % (ref 0–1)
Basophils Absolute: 0 10*3/uL (ref 0.0–0.1)
EOS ABS: 0.2 10*3/uL (ref 0.0–0.7)
Eosinophils Relative: 4 % (ref 0–5)
HCT: 38.3 % (ref 36.0–46.0)
HEMOGLOBIN: 13 g/dL (ref 12.0–15.0)
Lymphocytes Relative: 21 % (ref 12–46)
Lymphs Abs: 1.3 10*3/uL (ref 0.7–4.0)
MCH: 32.2 pg (ref 26.0–34.0)
MCHC: 33.9 g/dL (ref 30.0–36.0)
MCV: 94.8 fL (ref 78.0–100.0)
MONOS PCT: 9 % (ref 3–12)
Monocytes Absolute: 0.5 10*3/uL (ref 0.1–1.0)
NEUTROS PCT: 66 % (ref 43–77)
Neutro Abs: 4 10*3/uL (ref 1.7–7.7)
PLATELETS: 310 10*3/uL (ref 150–400)
RBC: 4.04 MIL/uL (ref 3.87–5.11)
RDW: 14.2 % (ref 11.5–15.5)
WBC: 6.1 10*3/uL (ref 4.0–10.5)

## 2013-04-21 LAB — HEPATIC FUNCTION PANEL
ALT: 13 U/L (ref 0–35)
AST: 17 U/L (ref 0–37)
Albumin: 4.2 g/dL (ref 3.5–5.2)
Alkaline Phosphatase: 36 U/L — ABNORMAL LOW (ref 39–117)
BILIRUBIN TOTAL: 0.4 mg/dL (ref 0.2–1.2)
Bilirubin, Direct: 0.1 mg/dL (ref 0.0–0.3)
Indirect Bilirubin: 0.3 mg/dL (ref 0.2–1.2)
Total Protein: 6.4 g/dL (ref 6.0–8.3)

## 2013-04-21 LAB — TSH: TSH: 0.635 u[IU]/mL (ref 0.350–4.500)

## 2013-04-24 MED ORDER — PEG-KCL-NACL-NASULF-NA ASC-C 100 G PO SOLR: 1.0000 | Freq: Once | ORAL | Status: DC

## 2013-04-25 ENCOUNTER — Encounter: Payer: Self-pay | Admitting: Neurology

## 2013-04-25 ENCOUNTER — Ambulatory Visit (INDEPENDENT_AMBULATORY_CARE_PROVIDER_SITE_OTHER): Payer: Medicare Other | Admitting: Neurology

## 2013-04-25 VITALS — BP 102/62 | HR 81 | Ht 61.0 in | Wt 106.0 lb

## 2013-04-25 DIAGNOSIS — F329 Major depressive disorder, single episode, unspecified: Secondary | ICD-10-CM | POA: Insufficient documentation

## 2013-04-25 DIAGNOSIS — R413 Other amnesia: Secondary | ICD-10-CM | POA: Insufficient documentation

## 2013-04-25 DIAGNOSIS — F32A Depression, unspecified: Secondary | ICD-10-CM | POA: Insufficient documentation

## 2013-04-25 DIAGNOSIS — F3289 Other specified depressive episodes: Secondary | ICD-10-CM

## 2013-04-25 MED ORDER — LEVETIRACETAM 250 MG PO TABS: 250.0000 mg | ORAL_TABLET | Freq: Two times a day (BID) | ORAL | Status: DC

## 2013-04-25 NOTE — Patient Instructions (Signed)
I had a long discussion with the patient and daughter with regards to her memory loss and cognitive difficulties and highlighted the need to treat her depression first. She is reluctant to try cholinesterase inhibitors at the current time due to her ongoing GI problems and diarrhea. She was given a refill of Keppra 250 twice daily and advised to take Celexa 10 mg daily for depression. She'll return to for followup in 2 months or call earlier if necessary.

## 2013-04-25 NOTE — Progress Notes (Signed)
[facility] [address] [place]. [place] [address] [phone]       OFFICE FOLLOW-UP NOTE  [name] Date of Birth:  March 17, 1928 Medical Record Number:  [id number]   HPI:  Update 04/25/2013 ; She returns for followup of her last visit 3 months ago. She is accompanied by her daughter. The patient has not been taking Celexa consistently on regular basis and she states that she forgets to take the pills at times. She continues to have a mild memory difficulties. She is states that she is under significant stress because she has had diarrhea and stomach upset problems and she plans to have endoscopy soon. She's also lost 15 pounds within the last few months. Patient has reduced the dose of Keppra to 250 twice daily and she's not had any episodes of sensory disturbance or seizures. She underwent EEG on 02/01/13 which was normal without any epileptiform activity. MRI scan of the brain on 02/10/13 shows remote age hemorrhage and blood products in the right parietal subarachnoid spaces which were improved and persistent right frontal hyperintensity which was also improved compared with previous scan from 11/20/2012. No acute abnormality was noted. Initial Consult 01/24/2013 ; 15 year Caucasian lady seen for followup following hospital admission on 11/19/12 for subarachnoid hemorrhage and seizures. She presented with recurrent transient episodes of left body paresthesias and difficulty speaking. The episode began in the left arm and gradually over minutes progressed to involve the face and lasted several minutes only. At times she was unable to form words and speak but she could understand what other people were saying. She described this as a feeling of pins and needles but without any impairment of consciousness. She had similar episodes in 2010 when she had a similar localized right frontal convexity subarachnoid hemorrhage. She had had no head trauma or significant headaches or had  bumped her head. And at times MRI scan had shown similar findings to the current one which showed localized blood in the right frontal convexity with some diffusion positive changes suggesting microinfarction adjacent. She previously has cerebral catheter angiogram performed which showed no evidence of underlying AVM, aneurysms or vascular abnormalities. She was started on Neurontin for seizures and did well for a while but then gradually decided to stop the medication she did follow up with me for 1 year   but subsequently was lost to followup. She had EEG during the previous admission and 2010 which did not show definite seizure activity.She had a repeat EEG on 11/21/12 which was normal. CT angiogram of the brain on 11/16/14and showed no evidence of underlying aneurysm or vascular abnormality. Urine drug screen was negative. Patient was started on Keppra initially 500 twice a day subsequent when episodes persisted was increased to thousand twice a day and Depakote ER was added. Patient states she's did well since discharge and has not had recurrent episodes of numbness however she has had confusion and agitation issues and hence has stopped the Depakote and in fact has even reduce the Keppra to 500 twice daily. Patient complains of short-term problems as well as intermittent confusion, balance issues. She forgets how to use a credit card and daughter has to help her in on several occasions. She previously had home so Mar[place] which appear to be healing now and she does see a home care nurse twice a week. She hasn't never been diagnosed in the past with depression or been on any medication for that. She denies headache, focal extremity weakness, numbness  or vision problems.  ROS:   14 system review of systems is positive for appetite and activity change, weight change, excessive thirst, black stools, diarrhea, neck swelling, bowel incontinence, easy bruisability and memory loss and all other systems negative PMH:    Past Medical History  Diagnosis Date  . COPD (chronic obstructive pulmonary disease)   . Arthritis   . Stroke     TIA four years ago  . Seizures     x 2 last year    Social History:  History   Social History  . Marital Status: Widowed    Spouse Name: N/A    Number of Children: 6  . Years of Education: college   Occupational History  . retired    Social History Main Topics  . Smoking status: Former Smoker -- 1.00 packs/day for 50 years    Quit date: 11/13/1997  . Smokeless tobacco: Not on file  . Alcohol Use: Yes     Comment: 2 glasses a night wine (none in a year)  . Drug Use: No  . Sexual Activity: Not Currently   Other Topics Concern  . Not on file   Social History Narrative  . No narrative on file    Medications:   Current Outpatient Prescriptions on File Prior to Visit  Medication Sig Dispense Refill  . diphenoxylate-atropine (LOMOTIL) 2.5-0.025 MG per tablet Take by mouth 4 (four) times daily as needed for diarrhea or loose stools.      . saccharomyces boulardii (FLORASTOR) 250 MG capsule Take 250 mg by mouth 2 (two) times daily.      . simvastatin (ZOCOR) 40 MG tablet Take 40 mg by mouth daily. 1/2 tab      . thiamine 100 MG tablet Take 1 tablet (100 mg total) by mouth daily.  30 tablet  3  . citalopram (CELEXA) 10 MG tablet Take 1 tablet (10 mg total) by mouth daily.  60 tablet  1   No current facility-administered medications on file prior to visit.    Allergies:   Allergies  Allergen Reactions  . Penicillins Anaphylaxis    Physical Exam General: well developed, well nourished elderly Caucasian lady, seated, in no evident distress Head: head normocephalic and atraumatic. Orohparynx benign Neck: supple with no carotid or supraclavicular bruits Cardiovascular: regular rate and rhythm, no murmurs Musculoskeletal: no deformity Skin:  no rash/petichiae Vascular:  Normal pulses all extremities Filed Vitals:   04/25/13 1442  BP: 102/62  Pulse: 81    Neurologic Exam Mental Status: Awake and fully alert. Oriented to place and time. Recent and remote memory intact. Attention span, concentration and fund of knowledge appropriate. Mood and affect appear mildly depressed. Geriatric depression scale score 7 suggestive of mild depression. Mini-Mental status exam scored 27/30 with only one deficit in drawing. Animal fluency test 12. Geriatric Depression scale 10. Animal fluency test 10. Clock drawing 4/4 Cranial Nerves: Fundoscopic exam not done   Pupils equal, briskly reactive to light. Extraocular movements full without nystagmus. Visual fields full to confrontation. Hearing intact. Facial sensation intact. Face, tongue, palate moves normally and symmetrically.  Motor: Normal bulk and tone. Normal strength in all tested extremity muscles. Sensory.: intact to touch and pinprick and vibratory sensation.  Coordination: Rapid alternating movements normal in all extremities. Finger-to-nose and heel-to-shin performed accurately bilaterally. Gait and Station: Arises from chair without difficulty. Stance is normal. Gait demonstrates normal stride length and balance . Able to heel, toe and tandem walk without difficulty.  Reflexes: 1+ and  symmetric. Toes downgoing.      ASSESSMENT: 56 year Caucasian lady with a recurrent right frontal convexity localized tiny subarachnoid hemorrhage with simple partial seizures. Prior history of similar episode in 2010 with cerebral catheter angiogram showing no underlying vascular abnormality. Persistent mild confusion and memory loss likely from untreated depression    PLAN: I had a long discussion with the patient and daughter with regards to her memory loss and cognitive difficulties and highlighted the need to treat her depression first. She is reluctant to try cholinesterase inhibitors at the current time due to her ongoing GI problems and diarrhea. She was given a refill of Keppra 250 twice daily and advised to take  Celexa 10 mg daily for depression. She'll return to for followup in 2 months or call earlier if necessary.

## 2013-05-12 ENCOUNTER — Encounter (HOSPITAL_COMMUNITY): Payer: Self-pay | Admitting: Pharmacy Technician

## 2013-05-24 ENCOUNTER — Ambulatory Visit (HOSPITAL_COMMUNITY)
Admission: RE | Admit: 2013-05-24 | Discharge: 2013-05-24 | Disposition: A | Discharge status: Home / Self Care | Payer: Medicare Other | Source: Ambulatory Visit | Attending: Internal Medicine | Admitting: Internal Medicine

## 2013-05-24 ENCOUNTER — Encounter (HOSPITAL_COMMUNITY)
Admission: RE | Disposition: A | Discharge status: Home / Self Care | Payer: Self-pay | Source: Ambulatory Visit | Attending: Internal Medicine

## 2013-05-24 ENCOUNTER — Encounter (HOSPITAL_COMMUNITY): Payer: Self-pay | Admitting: *Deleted

## 2013-05-24 DIAGNOSIS — Z85828 Personal history of other malignant neoplasm of skin: Secondary | ICD-10-CM | POA: Insufficient documentation

## 2013-05-24 DIAGNOSIS — K208 Other esophagitis without bleeding: Secondary | ICD-10-CM

## 2013-05-24 DIAGNOSIS — K2100 Gastro-esophageal reflux disease with esophagitis, without bleeding: Secondary | ICD-10-CM | POA: Insufficient documentation

## 2013-05-24 DIAGNOSIS — J449 Chronic obstructive pulmonary disease, unspecified: Secondary | ICD-10-CM | POA: Insufficient documentation

## 2013-05-24 DIAGNOSIS — Z87891 Personal history of nicotine dependence: Secondary | ICD-10-CM | POA: Insufficient documentation

## 2013-05-24 DIAGNOSIS — F329 Major depressive disorder, single episode, unspecified: Secondary | ICD-10-CM | POA: Insufficient documentation

## 2013-05-24 DIAGNOSIS — Z79899 Other long term (current) drug therapy: Secondary | ICD-10-CM | POA: Insufficient documentation

## 2013-05-24 DIAGNOSIS — K921 Melena: Secondary | ICD-10-CM

## 2013-05-24 DIAGNOSIS — Z8673 Personal history of transient ischemic attack (TIA), and cerebral infarction without residual deficits: Secondary | ICD-10-CM | POA: Insufficient documentation

## 2013-05-24 DIAGNOSIS — R634 Abnormal weight loss: Secondary | ICD-10-CM

## 2013-05-24 DIAGNOSIS — R197 Diarrhea, unspecified: Secondary | ICD-10-CM

## 2013-05-24 DIAGNOSIS — Q438 Other specified congenital malformations of intestine: Secondary | ICD-10-CM

## 2013-05-24 DIAGNOSIS — Z880 Allergy status to penicillin: Secondary | ICD-10-CM | POA: Insufficient documentation

## 2013-05-24 DIAGNOSIS — J4489 Other specified chronic obstructive pulmonary disease: Secondary | ICD-10-CM | POA: Insufficient documentation

## 2013-05-24 DIAGNOSIS — K449 Diaphragmatic hernia without obstruction or gangrene: Secondary | ICD-10-CM

## 2013-05-24 DIAGNOSIS — M129 Arthropathy, unspecified: Secondary | ICD-10-CM | POA: Insufficient documentation

## 2013-05-24 DIAGNOSIS — F3289 Other specified depressive episodes: Secondary | ICD-10-CM | POA: Insufficient documentation

## 2013-05-24 HISTORY — DX: Major depressive disorder, single episode, unspecified: F32.9

## 2013-05-24 HISTORY — PX: COLONOSCOPY: SHX5424

## 2013-05-24 HISTORY — PX: ESOPHAGOGASTRODUODENOSCOPY: SHX5428

## 2013-05-24 HISTORY — DX: Depression, unspecified: F32.A

## 2013-05-24 SURGERY — COLONOSCOPY
Anesthesia: Moderate Sedation

## 2013-05-24 MED ORDER — PANTOPRAZOLE SODIUM 40 MG PO TBEC: 40.0000 mg | DELAYED_RELEASE_TABLET | Freq: Every day | ORAL | Status: DC

## 2013-05-24 MED ORDER — SODIUM CHLORIDE 0.9 % IV SOLN
INTRAVENOUS | Status: DC
  Administered 2013-05-24: 13:00:00 via INTRAVENOUS

## 2013-05-24 MED ORDER — MIDAZOLAM HCL 5 MG/5ML IJ SOLN
INTRAMUSCULAR | Status: AC
  Filled 2013-05-24: qty 10

## 2013-05-24 MED ORDER — MEPERIDINE HCL 50 MG/ML IJ SOLN
INTRAMUSCULAR | Status: AC
  Filled 2013-05-24: qty 1

## 2013-05-24 MED ORDER — MIDAZOLAM HCL 5 MG/5ML IJ SOLN
INTRAMUSCULAR | Status: DC | PRN
  Administered 2013-05-24 (×2): 1 mg via INTRAVENOUS
  Administered 2013-05-24: 2 mg via INTRAVENOUS
  Administered 2013-05-24 (×3): 1 mg via INTRAVENOUS

## 2013-05-24 MED ORDER — STERILE WATER FOR IRRIGATION IR SOLN
Status: DC | PRN
  Administered 2013-05-24: 13:00:00

## 2013-05-24 MED ORDER — LOPERAMIDE HCL 2 MG PO TABS: 2.0000 mg | ORAL_TABLET | Freq: Two times a day (BID) | ORAL | Status: DC

## 2013-05-24 MED ORDER — MEPERIDINE HCL 50 MG/ML IJ SOLN
INTRAMUSCULAR | Status: DC | PRN
  Administered 2013-05-24: 15 mg via INTRAVENOUS
  Administered 2013-05-24: 10 mg via INTRAVENOUS
  Administered 2013-05-24: 15 mg via INTRAVENOUS

## 2013-05-24 NOTE — H&P (Signed)
Bridget Ball is an 80 y.o. female.   Chief Complaint: Patient's here for EGD and colonoscopy to HPI: Patient is a 8-year-old Caucasian female who presents with chronic diarrhea. She is having anywhere from 5-8 stools per day. Most of her bowel movements occur following meals. At times she has no control. She says she has lost 15 pounds this year. She has good appetite she is afraid to eat because of causes diarrhea. She also has nocturnal diarrhea. She denies abdominal pain or rectal bleeding. She gives history of tarry stools. She says this occurs with red wine which she does not drink often. Rations last colonoscopy was in every 2008 when she was complaining of diarrhea and sigmoid biopsy was negative. She had an EGD in August 2009 for abdominal pain revealing erosive gastritis possibly secondary to Naprosyn small sliding hiatal hernia. Family history is negative for CRC. He had CBC and TSH last month and is within normal limits. He also normal LFTs.   Past Medical History  Diagnosis Date  . COPD (chronic obstructive pulmonary disease)   . Arthritis   . Stroke     TIA four years ago  . Seizures     x 2 last year  . Depression   . Cancer     Skin cancer    Past Surgical History  Procedure Laterality Date  . Appendectomy    . Skin cancer excision    . Tubal ligation      Family History  Problem Relation Age of Onset  . Diabetes Mother    Social History:  reports that she quit smoking about 15 years ago. She does not have any smokeless tobacco history on file. She reports that she drinks about 4.2 ounces of alcohol per week. She reports that she does not use illicit drugs.  Allergies:  Allergies  Allergen Reactions  . Penicillins Anaphylaxis    Medications Prior to Admission  Medication Sig Dispense Refill  . citalopram (CELEXA) 10 MG tablet Take 1 tablet (10 mg total) by mouth daily.  60 tablet  1  . diphenoxylate-atropine (LOMOTIL) 2.5-0.025 MG per tablet Take by mouth  4 (four) times daily as needed for diarrhea or loose stools.      . levETIRAcetam (KEPPRA) 250 MG tablet Take 1 tablet (250 mg total) by mouth 2 (two) times daily.  60 tablet  2  . saccharomyces boulardii (FLORASTOR) 250 MG capsule Take 250 mg by mouth 2 (two) times daily.      Marland Kitchen thiamine 100 MG tablet Take 1 tablet (100 mg total) by mouth daily.  30 tablet  3    No results found for this or any previous visit (from the past 48 hour(s)). No results found.  ROS  Blood pressure 116/70, pulse 95, temperature 98.1 F (36.7 C), temperature source Oral, resp. rate 18, height 5\' 1"  (1.549 m), weight 106 lb (48.081 kg), SpO2 94.00%. Physical Exam  Constitutional:  Well-developed thin Caucasian female who is in NAD.  HENT:  Mouth/Throat: Oropharynx is clear and moist.  She has two teeth in lower jaw  Eyes: Conjunctivae are normal. No scleral icterus.  Neck: No thyromegaly present.  Cardiovascular: Normal rate, regular rhythm and normal heart sounds.   Respiratory: Effort normal and breath sounds normal.  Chest AP diameter increased  GI: Soft. She exhibits no distension and no mass. There is no tenderness.  Musculoskeletal: She exhibits no edema.  Lymphadenopathy:    She has no cervical adenopathy.  Neurological: She is  alert.  Skin: Skin is warm and dry.     Assessment/Plan History of melena and weight loss. Chronic diarrhea. Diagnostic EGD and colonoscopy.  Bridget Ball Bridget Ball 05/24/2013, 1:16 PM

## 2013-05-24 NOTE — Discharge Instructions (Signed)
Resume usual medications but discontinue diphenoxylate or Lomotil. Pantoprazole 40 mg by mouth 30 minutes before breakfast daily. Loperamide or Imodium OTC 2 mg by mouth for breakfast and lunch daily. No driving for 24 hours. Physician will call with biopsy results.  Gastrointestinal Endoscopy Care After Refer to this sheet in the next few weeks. These instructions provide you with information on caring for yourself after your procedure. Your caregiver may also give you more specific instructions. Your treatment has been planned according to current medical practices, but problems sometimes occur. Call your caregiver if you have any problems or questions after your procedure. HOME CARE INSTRUCTIONS  If you were given medicine to help you relax (sedative), do not drive, operate machinery, or sign important documents for 24 hours.  Avoid alcohol and hot or warm beverages for the first 24 hours after the procedure.  Only take over-the-counter or prescription medicines for pain, discomfort, or fever as directed by your caregiver. You may resume taking your normal medicines unless your caregiver tells you otherwise. Ask your caregiver when you may resume taking medicines that may cause bleeding, such as aspirin, clopidogrel, or warfarin.  You may return to your normal diet and activities on the day after your procedure, or as directed by your caregiver. Walking may help to reduce any bloated feeling in your abdomen.  Drink enough fluids to keep your urine clear or pale yellow.  You may gargle with salt water if you have a sore throat. SEEK IMMEDIATE MEDICAL CARE IF:  You have severe nausea or vomiting.  You have severe abdominal pain, abdominal cramps that last longer than 6 hours, or abdominal swelling (distention).  You have severe shoulder or back pain.  You have trouble swallowing.  You have shortness of breath, your breathing is shallow, or you are breathing faster than normal.  You  have a fever or a rapid heartbeat.  You vomit blood or material that looks like coffee grounds.  You have bloody, black, or tarry stools. MAKE SURE YOU:  Understand these instructions.  Will watch your condition.  Will get help right away if you are not doing well or get worse. Document Released: 08/06/2003 Document Revised: 06/23/2011 Document Reviewed: 03/24/2011 [facility] Patient Information 2014 [place], [place].

## 2013-05-24 NOTE — Op Note (Signed)
EGD PROCEDURE REPORT  PATIENT:  Bridget Ball  MR#:  [id number] Birthdate:  1928/12/26, 80 y.o., female Endoscopist:  Dr. Rogene Houston, MD Referred By:  Dr. Delphina Cahill, MD Procedure Date: 05/24/2013  Procedure:   EGD & Colonoscopy(incomplete).  Indications:  Patient is a 82-year-old Caucasian female who presents with history of melena 15 pound weight loss in nonbloody diarrhea. She has had diarrhea off and on for years. She had colonoscopy in February 2008 revealing normal exam and biopsy was negative for microscopic or collagenous colitis.           Informed Consent:  The risks, benefits, alternatives & imponderables which include, but are not limited to, bleeding, infection, perforation, drug reaction and potential missed lesion have been reviewed.  The potential for biopsy, lesion removal, esophageal dilation, etc. have also been discussed.  Questions have been answered.  All parties agreeable.  Please see history & physical in medical record for more information.  Medications:  Demerol 40 mg IV Versed 7 mg IV Cetacaine spray topically for oropharyngeal anesthesia  EGD  Description of procedure:  The endoscope was introduced through the mouth and advanced to the second portion of the duodenum without difficulty or limitations. The mucosal surfaces were surveyed very carefully during advancement of the scope and upon withdrawal.  Findings:  Esophagus:  Mucosa of the esophagus was normal. Single erosion noted at GE junction along with erythema and edema. No ring or stricture was present. GEJ:  34 cm Hiatus:  36cm Stomach:  Stomach was empty and distended very well with insufflation. Folds in the proximal stomach are normal. Examination of mucosa at gastric body, antrum, pyloric channel, angularis fundus and cardia was normal. Duodenum:  Normal bulbar and post bulbar mucosa.  Therapeutic/Diagnostic Maneuvers Performed:   Random biopsies taken from post bulbar mucosa looking for celiac  disease.  COLONOSCOPY Description of procedure:  After a digital rectal exam was performed, that colonoscope was advanced from the anus through the rectum and sigmoid colon.tortious noncompliance sigmoid colon resulting in formation. Scope was advanced past the sigmoid colon loop could not be granted by withdrawing the scope.since patient was uncomfortable scope was withdrawn. Mucosa of sigmoid colon and rectum revealed no abnormality. Scope was retroflexed in the rectum to examine the anorectal junction.   Findings:   Incomplete exam to distal descending colon. No evidence of sigmoid colitis or proctitis.  Random biopsies taken from mucosa of sigmoid colon.  Therapeutic/Diagnostic Maneuvers Performed:  See above  Complications:  None  Cecal Withdrawal Time:  NA  Impression:   EGD findings; Erosive reflux esophagitis with small sliding hiatal hernia. No evidence of gastritis or peptic ulcer disease. Random biopsies taken from post bulbar mucosa for routine histology.  Colonoscopy findings; Incomplete exam limited to flexible sigmoidoscopy. Patient has tortuous noncompliance sigmoid colon resulting in loop formation. Mucosa of sigmoid colon and rectum was normal. Mucosal biopsy taken from sigmoid colon to rule out microscopic or collagenous colitis.  Recommendations:  Pantoprazole 40 mg by mouth every morning. Discontinue diphenoxylate. Imodium OTC 2 mg by mouth twice a day. I will be contacting patient with results of biopsy and further recommendations.  Rogene Houston  05/24/2013 2:18 PM  CC: Dr. Delphina Cahill, MD & Dr. Rayne Du ref. provider found

## 2013-05-25 ENCOUNTER — Encounter (HOSPITAL_COMMUNITY): Payer: Self-pay | Admitting: Internal Medicine

## 2013-05-30 ENCOUNTER — Encounter (INDEPENDENT_AMBULATORY_CARE_PROVIDER_SITE_OTHER): Payer: Self-pay | Admitting: *Deleted

## 2013-06-08 NOTE — Progress Notes (Signed)
Apt has been scheduled for 06/27/13 with Deberah Castle, NP.

## 2013-06-14 ENCOUNTER — Telehealth: Payer: Self-pay | Admitting: Neurology

## 2013-06-14 NOTE — Telephone Encounter (Signed)
Patient is having concerns about her vision--she wonders if her condition could be causing her vision problems.

## 2013-06-16 NOTE — Telephone Encounter (Signed)
Patient states her eyes are bothering her, she stated she cancelled her appointment to the eye doctor. She asked should she go to the doctor because her dog is sick , i advised her to go she need to be able to see. She also  asked me about the dog health i advised her to go to a vet .

## 2013-06-27 ENCOUNTER — Encounter (INDEPENDENT_AMBULATORY_CARE_PROVIDER_SITE_OTHER): Payer: Self-pay | Admitting: Internal Medicine

## 2013-06-27 ENCOUNTER — Ambulatory Visit (INDEPENDENT_AMBULATORY_CARE_PROVIDER_SITE_OTHER): Payer: Medicare Other | Admitting: Internal Medicine

## 2013-06-27 VITALS — BP 98/60 | HR 60 | Temp 98.1°F | Ht 61.0 in | Wt 104.9 lb

## 2013-06-27 DIAGNOSIS — K52839 Microscopic colitis, unspecified: Secondary | ICD-10-CM | POA: Insufficient documentation

## 2013-06-27 DIAGNOSIS — K5289 Other specified noninfective gastroenteritis and colitis: Secondary | ICD-10-CM

## 2013-06-27 NOTE — Progress Notes (Signed)
Subjective:     Patient ID: Bridget Ball, female   DOB: Sep 23, 1928, 80 y.o.   MRN: [id number]  HPI Here today for f/u. She tells me today her diarrhea is better. She has had one-three  stools a day. She does have gas. Her BM look like oatmeal. Stools are thicker. Presenting taking Budesonide 6mg  x 2 weeks for microscopic colitis. She has not been taking the Imodium BID. Appetite is better. No weight loss. No abdominal pain. No melena or bright red rectal bleeding.    05/24/13 EGD/Colonoscopy;: chronic diarrhea and black stools: EGD findings;  Erosive reflux esophagitis with small sliding hiatal hernia.  No evidence of gastritis or peptic ulcer disease.  Random biopsies taken from post bulbar mucosa for routine histology.  Colonoscopy findings;  Incomplete exam limited to flexible sigmoidoscopy.  Patient has tortuous noncompliance sigmoid colon resulting in loop formation.  Mucosa of sigmoid colon and rectum was normal.  Mucosal biopsy taken from sigmoid colon to rule out microscopic or collagenous colitis.  Recommendations:  Pantoprazole 40 mg by mouth every morning.  Discontinue diphenoxylate.  Imodium OTC 2 mg by mouth twice a day.  I will be contacting patient with results of biopsy and further recommendations.  Notes Recorded by Rogene Houston, MD on 05/29/2013 at 9:28 PM Please call patient with results and also prescription for budesonide She has microscopic colitis. Will treat with budesonide 9 mg daily for 2 weeks 6 mg daily for 2 weeks and 3 mg daily for 2 weeks. Office visit in 4 weeks. Report to PCP    Review of Systems Past Medical History  Diagnosis Date  . COPD (chronic obstructive pulmonary disease)   . Arthritis   . Stroke     TIA four years ago  . Seizures     x 2 last year  . Depression   . Cancer     Skin cancer    Past Surgical History  Procedure Laterality Date  . Appendectomy    . Skin cancer excision    . Tubal ligation    . Colonoscopy N/A  05/24/2013    Procedure: COLONOSCOPY;  Surgeon: Rogene Houston, MD;  [facility];  Service: Endoscopy;  Laterality: N/A;  100  . Esophagogastroduodenoscopy N/A 05/24/2013    Procedure: ESOPHAGOGASTRODUODENOSCOPY (EGD);  Surgeon: Rogene Houston, MD;  Location: [facility];  Service: Endoscopy;  Laterality: N/A;    Allergies  Allergen Reactions  . Penicillins Anaphylaxis    Current Outpatient Prescriptions on File Prior to Visit  Medication Sig Dispense Refill  . citalopram (CELEXA) 10 MG tablet Take 1 tablet (10 mg total) by mouth daily.  60 tablet  1  . levETIRAcetam (KEPPRA) 250 MG tablet Take 1 tablet (250 mg total) by mouth 2 (two) times daily.  60 tablet  2  . loperamide (IMODIUM A-D) 2 MG tablet Take 1 tablet (2 mg total) by mouth 2 (two) times daily before a meal.  30 tablet  0  . pantoprazole (PROTONIX) 40 MG tablet Take 1 tablet (40 mg total) by mouth daily before breakfast.  30 tablet  5  . thiamine 100 MG tablet Take 1 tablet (100 mg total) by mouth daily.  30 tablet  3  . saccharomyces boulardii (FLORASTOR) 250 MG capsule Take 250 mg by mouth 2 (two) times daily.       No current facility-administered medications on file prior to visit.        Objective:   Physical Exam  Filed Vitals:   06/27/13 1137  BP: 98/60  Pulse: 60  Temp: 98.1 F (36.7 C)  Height: 5\' 1"  (1.549 m)  Weight: 104 lb 14.4 oz (47.582 kg)   Alert and oriented. Skin warm and dry. Oral mucosa is moist.   . Sclera anicteric, conjunctivae is pink. Thyroid not enlarged. No cervical lymphadenopathy. Lungs clear. Heart regular rate and rhythm.  Abdomen is soft. Bowel sounds are positive. No hepatomegaly. No abdominal masses felt. No tenderness.  No edema to lower extremities.  -     Assessment:     Microscopic colitis. Stools are better now. Has not been taking the Imodium.    Plan:     Finish the Budesonide. OV in 3 months. Call with PR report in about a month.  Imodium BID.

## 2013-06-27 NOTE — Patient Instructions (Addendum)
OV in 3 months. Take Imodium one in morning and one in the evening

## 2013-07-05 DIAGNOSIS — Z8619 Personal history of other infectious and parasitic diseases: Secondary | ICD-10-CM

## 2013-07-05 DIAGNOSIS — Z8709 Personal history of other diseases of the respiratory system: Secondary | ICD-10-CM

## 2013-07-05 HISTORY — DX: Personal history of other diseases of the respiratory system: Z87.09

## 2013-07-05 HISTORY — DX: Personal history of other infectious and parasitic diseases: Z86.19

## 2013-07-08 ENCOUNTER — Inpatient Hospital Stay (HOSPITAL_COMMUNITY)
Admission: EM | Admit: 2013-07-08 | Discharge: 2013-07-12 | DRG: 392 | Disposition: A | Discharge status: Home / Self Care | Payer: Medicare Other | Attending: Family Medicine | Admitting: Family Medicine

## 2013-07-08 ENCOUNTER — Emergency Department (HOSPITAL_COMMUNITY): Payer: Medicare Other

## 2013-07-08 ENCOUNTER — Encounter (HOSPITAL_COMMUNITY): Payer: Self-pay | Admitting: Emergency Medicine

## 2013-07-08 DIAGNOSIS — K5289 Other specified noninfective gastroenteritis and colitis: Principal | ICD-10-CM | POA: Diagnosis present

## 2013-07-08 DIAGNOSIS — R569 Unspecified convulsions: Secondary | ICD-10-CM | POA: Diagnosis present

## 2013-07-08 DIAGNOSIS — K52839 Microscopic colitis, unspecified: Secondary | ICD-10-CM | POA: Diagnosis present

## 2013-07-08 DIAGNOSIS — R197 Diarrhea, unspecified: Secondary | ICD-10-CM

## 2013-07-08 DIAGNOSIS — Z8673 Personal history of transient ischemic attack (TIA), and cerebral infarction without residual deficits: Secondary | ICD-10-CM

## 2013-07-08 DIAGNOSIS — Z87891 Personal history of nicotine dependence: Secondary | ICD-10-CM

## 2013-07-08 DIAGNOSIS — Z85828 Personal history of other malignant neoplasm of skin: Secondary | ICD-10-CM

## 2013-07-08 DIAGNOSIS — J4489 Other specified chronic obstructive pulmonary disease: Secondary | ICD-10-CM | POA: Diagnosis present

## 2013-07-08 DIAGNOSIS — K529 Noninfective gastroenteritis and colitis, unspecified: Secondary | ICD-10-CM | POA: Diagnosis present

## 2013-07-08 DIAGNOSIS — R112 Nausea with vomiting, unspecified: Secondary | ICD-10-CM

## 2013-07-08 DIAGNOSIS — N1330 Unspecified hydronephrosis: Secondary | ICD-10-CM | POA: Diagnosis present

## 2013-07-08 DIAGNOSIS — E876 Hypokalemia: Secondary | ICD-10-CM | POA: Diagnosis present

## 2013-07-08 DIAGNOSIS — R109 Unspecified abdominal pain: Secondary | ICD-10-CM | POA: Diagnosis present

## 2013-07-08 DIAGNOSIS — Z833 Family history of diabetes mellitus: Secondary | ICD-10-CM

## 2013-07-08 DIAGNOSIS — M129 Arthropathy, unspecified: Secondary | ICD-10-CM | POA: Diagnosis present

## 2013-07-08 DIAGNOSIS — J449 Chronic obstructive pulmonary disease, unspecified: Secondary | ICD-10-CM | POA: Diagnosis present

## 2013-07-08 HISTORY — DX: Noninfective gastroenteritis and colitis, unspecified: K52.9

## 2013-07-08 HISTORY — DX: Other amnesia: R41.3

## 2013-07-08 HISTORY — DX: Diaphragmatic hernia without obstruction or gangrene: K44.9

## 2013-07-08 LAB — URINALYSIS, ROUTINE W REFLEX MICROSCOPIC
BILIRUBIN URINE: NEGATIVE
Glucose, UA: NEGATIVE mg/dL
NITRITE: NEGATIVE
SPECIFIC GRAVITY, URINE: 1.01 (ref 1.005–1.030)
UROBILINOGEN UA: 0.2 mg/dL (ref 0.0–1.0)
pH: 7 (ref 5.0–8.0)

## 2013-07-08 LAB — COMPREHENSIVE METABOLIC PANEL
ALBUMIN: 4.1 g/dL (ref 3.5–5.2)
ALT: 16 U/L (ref 0–35)
AST: 21 U/L (ref 0–37)
Alkaline Phosphatase: 46 U/L (ref 39–117)
Anion gap: 11 (ref 5–15)
BILIRUBIN TOTAL: 0.6 mg/dL (ref 0.3–1.2)
BUN: 9 mg/dL (ref 6–23)
CHLORIDE: 101 meq/L (ref 96–112)
CO2: 29 mEq/L (ref 19–32)
Calcium: 9.6 mg/dL (ref 8.4–10.5)
Creatinine, Ser: 0.71 mg/dL (ref 0.50–1.10)
GFR calc Af Amer: >90 mL/min (ref >90)
GFR, EST NON AFRICAN AMERICAN: 78 mL/min — AB (ref >90)
Glucose, Bld: 104 mg/dL — ABNORMAL HIGH (ref 70–99)
Potassium: 4 mEq/L (ref 3.7–5.3)
SODIUM: 141 meq/L (ref 137–147)
Total Protein: 6.9 g/dL (ref 6.0–8.3)

## 2013-07-08 LAB — CBC WITH DIFFERENTIAL/PLATELET
BASOS ABS: 0 10*3/uL (ref 0.0–0.1)
Basophils Relative: 0 % (ref 0–1)
Eosinophils Absolute: 0 10*3/uL (ref 0.0–0.7)
Eosinophils Relative: 0 % (ref 0–5)
HCT: 43.3 % (ref 36.0–46.0)
Hemoglobin: 14.5 g/dL (ref 12.0–15.0)
Lymphocytes Relative: 5 % — ABNORMAL LOW (ref 12–46)
Lymphs Abs: 0.5 10*3/uL — ABNORMAL LOW (ref 0.7–4.0)
MCH: 32.5 pg (ref 26.0–34.0)
MCHC: 33.5 g/dL (ref 30.0–36.0)
MCV: 97.1 fL (ref 78.0–100.0)
Monocytes Absolute: 0.5 10*3/uL (ref 0.1–1.0)
Monocytes Relative: 5 % (ref 3–12)
Neutro Abs: 7.7 10*3/uL (ref 1.7–7.7)
Neutrophils Relative %: 90 % — ABNORMAL HIGH (ref 43–77)
Platelets: 235 10*3/uL (ref 150–400)
RBC: 4.46 MIL/uL (ref 3.87–5.11)
RDW: 15.1 % (ref 11.5–15.5)
WBC: 8.7 10*3/uL (ref 4.0–10.5)

## 2013-07-08 LAB — LACTIC ACID, PLASMA: Lactic Acid, Venous: 1 mmol/L (ref 0.5–2.2)

## 2013-07-08 LAB — URINE MICROSCOPIC-ADD ON

## 2013-07-08 LAB — LIPASE, BLOOD: LIPASE: 29 U/L (ref 11–59)

## 2013-07-08 MED ORDER — HEPARIN SODIUM (PORCINE) 5000 UNIT/ML IJ SOLN
5000.0000 [IU] | Freq: Three times a day (TID) | INTRAMUSCULAR | Status: DC
  Administered 2013-07-08 – 2013-07-12 (×11): 5000 [IU] via SUBCUTANEOUS
  Filled 2013-07-08 (×11): qty 1

## 2013-07-08 MED ORDER — FENTANYL CITRATE 0.05 MG/ML IJ SOLN
6.2500 ug | INTRAMUSCULAR | Status: AC | PRN
  Administered 2013-07-08 (×2): 6.5 ug via INTRAVENOUS
  Filled 2013-07-08 (×3): qty 2

## 2013-07-08 MED ORDER — PROMETHAZINE HCL 25 MG/ML IJ SOLN
6.2500 mg | Freq: Once | INTRAMUSCULAR | Status: AC
  Administered 2013-07-08: 6.25 mg via INTRAVENOUS

## 2013-07-08 MED ORDER — METRONIDAZOLE IN NACL 5-0.79 MG/ML-% IV SOLN
500.0000 mg | Freq: Three times a day (TID) | INTRAVENOUS | Status: DC
  Administered 2013-07-09 – 2013-07-10 (×5): 500 mg via INTRAVENOUS
  Filled 2013-07-08 (×5): qty 100

## 2013-07-08 MED ORDER — SODIUM CHLORIDE 0.9 % IV SOLN
INTRAVENOUS | Status: DC
  Administered 2013-07-08 – 2013-07-10 (×3): via INTRAVENOUS

## 2013-07-08 MED ORDER — SODIUM CHLORIDE 0.9 % IV SOLN: INTRAVENOUS | Status: DC

## 2013-07-08 MED ORDER — ACETAMINOPHEN 650 MG RE SUPP: 650.0000 mg | Freq: Four times a day (QID) | RECTAL | Status: DC | PRN

## 2013-07-08 MED ORDER — HYDROMORPHONE HCL PF 1 MG/ML IJ SOLN
0.5000 mg | INTRAMUSCULAR | Status: DC | PRN
  Administered 2013-07-09 (×2): 0.5 mg via INTRAVENOUS
  Filled 2013-07-08 (×2): qty 1

## 2013-07-08 MED ORDER — IOHEXOL 300 MG/ML  SOLN
100.0000 mL | Freq: Once | INTRAMUSCULAR | Status: AC | PRN
  Administered 2013-07-08: 100 mL via INTRAVENOUS

## 2013-07-08 MED ORDER — SACCHAROMYCES BOULARDII 250 MG PO CAPS
250.0000 mg | ORAL_CAPSULE | Freq: Two times a day (BID) | ORAL | Status: DC
  Administered 2013-07-08 – 2013-07-12 (×8): 250 mg via ORAL
  Filled 2013-07-08 (×8): qty 1

## 2013-07-08 MED ORDER — ONDANSETRON HCL 4 MG PO TABS: 4.0000 mg | ORAL_TABLET | Freq: Four times a day (QID) | ORAL | Status: DC | PRN

## 2013-07-08 MED ORDER — CIPROFLOXACIN IN D5W 400 MG/200ML IV SOLN
400.0000 mg | Freq: Two times a day (BID) | INTRAVENOUS | Status: DC
  Administered 2013-07-09 – 2013-07-10 (×3): 400 mg via INTRAVENOUS
  Filled 2013-07-08 (×3): qty 200

## 2013-07-08 MED ORDER — MORPHINE SULFATE 2 MG/ML IJ SOLN
2.0000 mg | INTRAMUSCULAR | Status: DC | PRN
  Administered 2013-07-08: 2 mg via INTRAVENOUS
  Filled 2013-07-08: qty 1

## 2013-07-08 MED ORDER — VITAMIN B-1 100 MG PO TABS: 100.0000 mg | ORAL_TABLET | Freq: Every day | ORAL | Status: DC

## 2013-07-08 MED ORDER — VITAMIN B-1 100 MG PO TABS
100.0000 mg | ORAL_TABLET | Freq: Every day | ORAL | Status: DC
  Administered 2013-07-08 – 2013-07-12 (×5): 100 mg via ORAL
  Filled 2013-07-08 (×5): qty 1

## 2013-07-08 MED ORDER — ONDANSETRON HCL 4 MG/2ML IJ SOLN
4.0000 mg | Freq: Four times a day (QID) | INTRAMUSCULAR | Status: DC | PRN
Start: 2013-07-08 — End: 2013-07-12
  Administered 2013-07-09 – 2013-07-12 (×3): 4 mg via INTRAVENOUS
  Filled 2013-07-08 (×3): qty 2

## 2013-07-08 MED ORDER — ALBUTEROL SULFATE (2.5 MG/3ML) 0.083% IN NEBU: 2.5000 mg | INHALATION_SOLUTION | RESPIRATORY_TRACT | Status: DC | PRN

## 2013-07-08 MED ORDER — BUDESONIDE-FORMOTEROL FUMARATE 160-4.5 MCG/ACT IN AERO
2.0000 | INHALATION_SPRAY | Freq: Two times a day (BID) | RESPIRATORY_TRACT | Status: DC
  Administered 2013-07-09 – 2013-07-11 (×6): 2 via RESPIRATORY_TRACT
  Filled 2013-07-08: qty 6

## 2013-07-08 MED ORDER — LEVETIRACETAM 500 MG PO TABS
250.0000 mg | ORAL_TABLET | Freq: Two times a day (BID) | ORAL | Status: DC
  Administered 2013-07-08: 250 mg via ORAL
  Filled 2013-07-08 (×2): qty 1

## 2013-07-08 MED ORDER — METRONIDAZOLE IN NACL 5-0.79 MG/ML-% IV SOLN
500.0000 mg | Freq: Once | INTRAVENOUS | Status: AC
  Administered 2013-07-08: 500 mg via INTRAVENOUS
  Filled 2013-07-08: qty 100

## 2013-07-08 MED ORDER — ONDANSETRON HCL 4 MG/2ML IJ SOLN: 4.0000 mg | Freq: Three times a day (TID) | INTRAMUSCULAR | Status: DC | PRN

## 2013-07-08 MED ORDER — PROMETHAZINE HCL 25 MG/ML IJ SOLN: 6.2500 mg | Freq: Four times a day (QID) | INTRAMUSCULAR | Status: DC | PRN

## 2013-07-08 MED ORDER — ACETAMINOPHEN 325 MG PO TABS
650.0000 mg | ORAL_TABLET | Freq: Four times a day (QID) | ORAL | Status: DC | PRN
  Administered 2013-07-09 – 2013-07-10 (×4): 650 mg via ORAL
  Filled 2013-07-08 (×4): qty 2

## 2013-07-08 MED ORDER — SODIUM CHLORIDE 0.9 % IV SOLN
INTRAVENOUS | Status: DC
  Administered 2013-07-08 (×2): via INTRAVENOUS

## 2013-07-08 MED ORDER — MORPHINE SULFATE 2 MG/ML IJ SOLN: 2.0000 mg | INTRAMUSCULAR | Status: DC | PRN

## 2013-07-08 MED ORDER — CITALOPRAM HYDROBROMIDE 20 MG PO TABS
10.0000 mg | ORAL_TABLET | Freq: Every day | ORAL | Status: DC
  Administered 2013-07-09 – 2013-07-12 (×4): 10 mg via ORAL
  Filled 2013-07-08 (×4): qty 1

## 2013-07-08 MED ORDER — IOHEXOL 300 MG/ML  SOLN
50.0000 mL | Freq: Once | INTRAMUSCULAR | Status: AC | PRN
  Administered 2013-07-08: 50 mL via ORAL

## 2013-07-08 MED ORDER — HYDROMORPHONE HCL PF 1 MG/ML IJ SOLN
0.2500 mg | INTRAMUSCULAR | Status: DC | PRN
  Administered 2013-07-08: 0.25 mg via INTRAVENOUS
  Filled 2013-07-08: qty 1

## 2013-07-08 MED ORDER — ONDANSETRON HCL 4 MG/2ML IJ SOLN
4.0000 mg | INTRAMUSCULAR | Status: AC | PRN
  Administered 2013-07-08 (×2): 4 mg via INTRAVENOUS
  Filled 2013-07-08 (×2): qty 2

## 2013-07-08 MED ORDER — CIPROFLOXACIN IN D5W 400 MG/200ML IV SOLN
400.0000 mg | Freq: Once | INTRAVENOUS | Status: AC
  Administered 2013-07-08: 400 mg via INTRAVENOUS
  Filled 2013-07-08: qty 200

## 2013-07-08 NOTE — ED Provider Notes (Signed)
CSN: [id number]     Arrival date & time 07/08/13  1130 History   First MD Initiated Contact with Patient 07/08/13 1312     Chief Complaint  Patient presents with  . Back Pain  . Emesis  . Diarrhea      HPI Pt was seen at 1320. Per pt and her family, c/o gradual onset and persistence of constant low back "pain" since last night. States the pain "moves all over my low back." Pain worsens with palpation of the area and body position changes. Pt also c/o gradual onset and persistence of multiple intermittent episodes of N/V/D since last night. Pt states she has hx of chronic diarrhea, with baseline 1 to 3 "more formed" stools/day recently, but states now the consistency of her stools has changed (looser). Pt had one episode of N/V today. Has not taken PO today due to N/V. Has been associated with generalized abd "pain." Pt's family was concerned pt was "going to get dehydrated." Denies incont/retention of bowel or bladder, no saddle anesthesia, no focal motor weakness, no tingling/numbness in extremities, no fevers, no injury, no dysuria/hematuria, no black or blood in stools or emesis.       Past Medical History  Diagnosis Date  . COPD (chronic obstructive pulmonary disease)   . Arthritis   . Stroke     TIA four years ago  . Depression   . Cancer     Skin cancer  . Seizures     x 2 last year due to bleeding to the brain.  . Memory difficulties   . Chronic diarrhea   . Hiatal hernia    Past Surgical History  Procedure Laterality Date  . Appendectomy    . Skin cancer excision    . Tubal ligation    . Colonoscopy N/A 05/24/2013    Procedure: COLONOSCOPY;  Surgeon: Rogene Houston, MD;  [facility];  Service: Endoscopy;  Laterality: N/A;  100  . Esophagogastroduodenoscopy N/A 05/24/2013    Procedure: ESOPHAGOGASTRODUODENOSCOPY (EGD);  Surgeon: Rogene Houston, MD;  [facility];  Service: Endoscopy;  Laterality: N/A;   Family History  Problem Relation Age of  Onset  . Diabetes Mother    History  Substance Use Topics  . Smoking status: Former Smoker -- 1.00 packs/day for 50 years    Quit date: 11/13/1997  . Smokeless tobacco: Not on file  . Alcohol Use: 4.2 oz/week    7 Glasses of wine per week     Comment: 2 glasses a night wine (none in a year)    Review of Systems ROS: Statement: All systems negative except as marked or noted in the HPI; Constitutional: Negative for fever and chills. ; ; Eyes: Negative for eye pain, redness and discharge. ; ; ENMT: Negative for ear pain, hoarseness, nasal congestion, sinus pressure and sore throat. ; ; Cardiovascular: Negative for chest pain, palpitations, diaphoresis, dyspnea and peripheral edema. ; ; Respiratory: Negative for cough, wheezing and stridor. ; ; Gastrointestinal: +N/V/D, abd pain. Negative for blood in stool, hematemesis, jaundice and rectal bleeding. . ; ; Genitourinary: Negative for dysuria, flank pain and hematuria. ; ; Musculoskeletal: +LBP. Negative for neck pain. Negative for swelling and trauma.; ; Skin: Negative for pruritus, rash, abrasions, blisters, bruising and skin lesion.; ; Neuro: Negative for headache, lightheadedness and neck stiffness. Negative for weakness, altered level of consciousness , altered mental status, extremity weakness, paresthesias, involuntary movement, seizure and syncope.       Allergies  Penicillins  Home Medications   Prior to Admission medications   Medication Sig Start Date End Date Taking? Authorizing Provider  budesonide (ENTOCORT EC) 3 MG 24 hr capsule Take 3 mg by mouth 2 (two) times daily.   Yes Historical Provider, MD  budesonide-formoterol (SYMBICORT) 160-4.5 MCG/ACT inhaler Inhale 2 puffs into the lungs 2 (two) times daily.   Yes Historical Provider, MD  citalopram (CELEXA) 10 MG tablet Take 1 tablet (10 mg total) by mouth daily. 01/24/13  Yes Garvin Fila, MD  levETIRAcetam (KEPPRA) 250 MG tablet Take 1 tablet (250 mg total) by mouth 2 (two)  times daily. 04/25/13  Yes Garvin Fila, MD  loperamide (IMODIUM A-D) 2 MG tablet Take 1 tablet (2 mg total) by mouth 2 (two) times daily before a meal. 05/24/13  Yes Rogene Houston, MD  Melatonin 5 MG TABS Take 5 mg by mouth at bedtime.   Yes Historical Provider, MD  pantoprazole (PROTONIX) 40 MG tablet Take 1 tablet (40 mg total) by mouth daily before breakfast. 05/24/13  Yes Rogene Houston, MD  saccharomyces boulardii (FLORASTOR) 250 MG capsule Take 250 mg by mouth 2 (two) times daily.   Yes Historical Provider, MD  thiamine 100 MG tablet Take 1 tablet (100 mg total) by mouth daily. 11/16/12  Yes Richard Marcia Brash, MD   BP 108/68  Pulse 70  Temp(Src) 97.9 F (36.6 C) (Oral)  Resp 16  Ht 5\' 1"  (1.549 m)  Wt 105 lb (47.628 kg)  BMI 19.85 kg/m2  SpO2 95% Physical Exam 1325: Physical examination:  Nursing notes reviewed; Vital signs and O2 SAT reviewed;  Constitutional: Well developed, Well nourished, Well hydrated, In no acute distress; Head:  Normocephalic, atraumatic; Eyes: EOMI, PERRL, No scleral icterus; ENMT: Mouth and pharynx normal, Mucous membranes moist; Neck: Supple, Full range of motion, No lymphadenopathy; Cardiovascular: Regular rate and rhythm, No gallop; Respiratory: Breath sounds clear & equal bilaterally, No rales, rhonchi, wheezes.  Speaking full sentences with ease, Normal respiratory effort/excursion; Chest: Nontender, Movement normal; Abdomen: Soft, +mild diffuse tenderness to palp. No rebound or guarding. Nondistended, Normal bowel sounds; Genitourinary: No CVA tenderness; Spine:  No midline CS, TS, LS tenderness. +TTP left lumbar paraspinal muscles. No rash.;; Extremities: Pulses normal, No tenderness, No edema, No calf edema or asymmetry.; Neuro: AA&Ox3. Major CN grossly intact.  Speech clear. No gross focal motor or sensory deficits in extremities.; Skin: Color normal, Warm, Dry.   ED Course  Procedures     EKG Interpretation None      MDM  MDM Reviewed:  previous chart, nursing note and vitals Reviewed previous: labs Interpretation: labs, x-ray and CT scan     Results for orders placed during the hospital encounter of 07/08/13  URINALYSIS, ROUTINE W REFLEX MICROSCOPIC      Result Value Ref Range   Color, Urine YELLOW  YELLOW   APPearance HAZY (*) CLEAR   Specific Gravity, Urine 1.010  1.005 - 1.030   pH 7.0  5.0 - 8.0   Glucose, UA NEGATIVE  NEGATIVE mg/dL   Hgb urine dipstick MODERATE (*) NEGATIVE   Bilirubin Urine NEGATIVE  NEGATIVE   Ketones, ur TRACE (*) NEGATIVE mg/dL   Protein, ur TRACE (*) NEGATIVE mg/dL   Urobilinogen, UA 0.2  0.0 - 1.0 mg/dL   Nitrite NEGATIVE  NEGATIVE   Leukocytes, UA MODERATE (*) NEGATIVE  URINE MICROSCOPIC-ADD ON      Result Value Ref Range   Squamous Epithelial / LPF FEW (*) RARE  WBC, UA 3-6  <3 WBC/hpf   RBC / HPF 7-10  <3 RBC/hpf   Bacteria, UA FEW (*) RARE  CBC WITH DIFFERENTIAL      Result Value Ref Range   WBC 8.7  4.0 - 10.5 K/uL   RBC 4.46  3.87 - 5.11 MIL/uL   Hemoglobin 14.5  12.0 - 15.0 g/dL   HCT 43.3  36.0 - 46.0 %   MCV 97.1  78.0 - 100.0 fL   MCH 32.5  26.0 - 34.0 pg   MCHC 33.5  30.0 - 36.0 g/dL   RDW 15.1  11.5 - 15.5 %   Platelets 235  150 - 400 K/uL   Neutrophils Relative % 90 (*) 43 - 77 %   Neutro Abs 7.7  1.7 - 7.7 K/uL   Lymphocytes Relative 5 (*) 12 - 46 %   Lymphs Abs 0.5 (*) 0.7 - 4.0 K/uL   Monocytes Relative 5  3 - 12 %   Monocytes Absolute 0.5  0.1 - 1.0 K/uL   Eosinophils Relative 0  0 - 5 %   Eosinophils Absolute 0.0  0.0 - 0.7 K/uL   Basophils Relative 0  0 - 1 %   Basophils Absolute 0.0  0.0 - 0.1 K/uL  COMPREHENSIVE METABOLIC PANEL      Result Value Ref Range   Sodium 141  137 - 147 mEq/L   Potassium 4.0  3.7 - 5.3 mEq/L   Chloride 101  96 - 112 mEq/L   CO2 29  19 - 32 mEq/L   Glucose, Bld 104 (*) 70 - 99 mg/dL   BUN 9  6 - 23 mg/dL   Creatinine, Ser 0.71  0.50 - 1.10 mg/dL   Calcium 9.6  8.4 - 10.5 mg/dL   Total Protein 6.9  6.0 - 8.3 g/dL    Albumin 4.1  3.5 - 5.2 g/dL   AST 21  0 - 37 U/L   ALT 16  0 - 35 U/L   Alkaline Phosphatase 46  39 - 117 U/L   Total Bilirubin 0.6  0.3 - 1.2 mg/dL   GFR calc non Af Amer 78 (*) >90 mL/min   GFR calc Af Amer >90  >90 mL/min   Anion gap 11  5 - 15  LIPASE, BLOOD      Result Value Ref Range   Lipase 29  11 - 59 U/L  LACTIC ACID, PLASMA      Result Value Ref Range   Lactic Acid, Venous 1.0  0.5 - 2.2 mmol/L   Dg Chest 2 View 07/08/2013   CLINICAL DATA:  Chest abdominal pain.  Weakness.  EXAM: CHEST  2 VIEW  COMPARISON:  None.  FINDINGS: The heart is normal in size. There is tortuosity, ectasia and calcification of the thoracic aorta. The pulmonary hila appear normal. The lungs demonstrate emphysematous changes. No acute pulmonary findings. No pleural effusion. The bony thorax is intact.  IMPRESSION: Emphysematous changes but no acute pulmonary findings.   Electronically Signed   By: Kalman Jewels M.D.   On: 07/08/2013 15:13   Ct Abdomen Pelvis W Contrast 07/08/2013   CLINICAL DATA:  Mid abdominal pain radiating into the back. Nausea and vomiting. Diarrhea.  EXAM: CT ABDOMEN AND PELVIS WITH CONTRAST  TECHNIQUE: Multidetector CT imaging of the abdomen and pelvis was performed using the standard protocol following bolus administration of intravenous contrast.  CONTRAST:  137mL OMNIPAQUE IOHEXOL 300 MG/ML IV. Oral contrast was also administered.  COMPARISON:  None.  FINDINGS: Wall thickening involving the cecum, ascending colon and proximal and mid transverse colon, associated with hyperemia. No abnormal fluid collection to suggest abscess. Interposition of the hepatic flexure of the colon between the liver and the right hemidiaphragm. No ascites. Large stool burden throughout the remainder of the colon. Scattered sigmoid colon diverticula without evidence of acute diverticulitis. Normal appearing stomach and small bowel.  Multiple small low-attenuation lesions throughout the liver, the largest  approximating 8 mm in the medial segment left lobe. The largest lesions represent simple cyst, and I suspect that the smaller ones are cysts as well, given their conspicuity for their small size. Normal spleen. Borderline pancreatic ductal dilation, as there is a stone within the pancreatic duct in the head of the pancreas. A calcification is also present within the pancreatic head parenchyma. Numerous gallstones are present within the gallbladder, including several in the gallbladder neck. Extrahepatic bile duct upper normal in caliber approximating 8 mm. High attenuation material in the distal common bile duct at the ampulla, likely sludge. No CT evidence for acute cholecystitis.  Severe left hydronephrosis without associated ureteral dilation. Delayed excretion of contrast by the left kidney. Numerous small cortical cysts involving the left kidney. Normal-appearing right kidney. Extensive aortoiliofemoral atherosclerosis without aneurysm. No significant lymphadenopathy.  Urinary bladder unremarkable. Uterus atrophic consistent with age. No adnexal masses or free pelvic fluid. Numerous pelvic phleboliths.  Bone window images demonstrate osseous demineralization, degenerative changes throughout the lumbar spine, degenerative changes involving the sacroiliac joints. Visualized lung bases clear apart from minimal scarring in the lower lobes. Emphysematous changes are present.  IMPRESSION: 1. Colitis involving the cecum, ascending colon, and proximal and mid transverse colon. No evidence of abscess. 2. Severe left hydronephrosis without associated ureteral dilation. Mild obstruction of the left kidney with delayed contrast excretion. 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 3. Cholelithiasis without CT evidence of acute cholecystitis. 4. Borderline extrahepatic biliary ductal dilation with probable sludge in the distal common bile duct. 5. Chronic calcific pancreatitis  involving the pancreatic head, including a stone in the pancreatic duct. 6. Sigmoid colon diverticulosis without evidence of acute diverticulitis. 7. Numerous hepatic lesions felt to represent simple cysts, given their conspicuity for their small size. The largest lesions are simple cysts. 8. Emphysematous changes in the lower lobes.   Electronically Signed   By: Evangeline Dakin M.D.   On: 07/08/2013 15:19    1550:  Colitis on CT scan; IV abx started. Dx and testing d/w pt and family.  Questions answered.  Verb understanding, agreeable to admit.  T/C to [facility] Dr. Roderic Palau, case discussed, including:  HPI, pertinent PM/SHx, VS/PE, dx testing, ED course and treatment:  Agrees pt need admit for colitis, but states [facility] does not have Uro MD to consult regarding incidental finding of left hydronephrosis on CT scan; he requests to call [facility] MD for transfer/admit.   1640:  T/C to [facility] Dr. Jerilee Hoh, case discussed, including:  HPI, pertinent PM/SHx, VS/PE, dx testing, ED course and treatment:  requests to call Uro MD to consult before will accept transfer.   1655:   T/C to [facility] Dr. Junious Silk, case discussed, including:  HPI, pertinent PM/SHx, VS/PE, dx testing, ED course and treatment:  States with pt's normal kidney functions he would not intervene surgically at this time. T/C to [facility] Dr. Jerilee Hoh and [facility] Dr. Roderic Palau, case discussed, including:  HPI, pertinent PM/SHx, VS/PE, dx testing, ED course  and treatment:  Agreeable to admit pt to [facility] at this time, requests to write temporary orders, obtain medical bed to team 1.      Alfonzo Feller, DO 07/11/13 1142

## 2013-07-08 NOTE — ED Notes (Signed)
Placed on  oxygen at 4 lpm.  sats up to 90's

## 2013-07-08 NOTE — ED Notes (Signed)
Pt states back pain began during the night. Also states she vomited this morning.

## 2013-07-08 NOTE — H&P (Signed)
[facility] History and Physical  Bridget Ball PYP:[id number] DOB: [birth year] DOA: 07/08/2013  Referring physician: Dr. Thurnell Garbe PCP: Delphina Cahill, MD  Gastroenterologist: Dr. Laural Golden  Chief Complaint: Abdominal pain  HPI: Bridget Ball is a 80 y.o. female with a history of microscopic colitis on oral budesonide and Imodium, presents to the emergency room with complaints of abdominal pain. Patient reports onset of symptoms occurring since yesterday. She chronically has diarrhea. Yesterday she developed abdominal pain and nausea and vomiting. She threw up her she had eaten and since then has been dry heaving. She describes lower abdominal pain. She reports eating cantaloupe and tomatoes yesterday which she feels may be related to her illness. She was evaluated in the emergency room with CT scan of the abdomen and pelvis which indicated colitis involving the cecum, ascending colon and proximal mid transverse colon. Incidentally, there was noted to be severe left-sided hydronephrosis. Patient is being admitted for further treatment   Review of Systems:  Pertinent positives as per history of present illness, otherwise negative   Past Medical History  Diagnosis Date  . COPD (chronic obstructive pulmonary disease)   . Arthritis   . Stroke     TIA four years ago  . Depression   . Cancer     Skin cancer  . Seizures     x 2 last year due to bleeding to the brain.  . Memory difficulties   . Chronic diarrhea   . Hiatal hernia    Past Surgical History  Procedure Laterality Date  . Appendectomy    . Skin cancer excision    . Tubal ligation    . Colonoscopy N/A 05/24/2013    Procedure: COLONOSCOPY;  Surgeon: Rogene Houston, MD;  Location: [facility];  Service: Endoscopy;  Laterality: N/A;  100  . Esophagogastroduodenoscopy N/A 05/24/2013    Procedure: ESOPHAGOGASTRODUODENOSCOPY (EGD);  Surgeon: Rogene Houston, MD;  Location: [facility];  Service: Endoscopy;  Laterality: N/A;    Social History:  reports that she quit smoking about 15 years ago. She does not have any smokeless tobacco history on file. She reports that she drinks about 4.2 ounces of alcohol per week. She reports that she does not use illicit drugs.  Allergies  Allergen Reactions  . Penicillins Anaphylaxis    Family History  Problem Relation Age of Onset  . Diabetes Mother      Prior to Admission medications   Medication Sig Start Date End Date Taking? Authorizing Provider  budesonide (ENTOCORT EC) 3 MG 24 hr capsule Take 3 mg by mouth 2 (two) times daily.   Yes Historical Provider, MD  budesonide-formoterol (SYMBICORT) 160-4.5 MCG/ACT inhaler Inhale 2 puffs into the lungs 2 (two) times daily.   Yes Historical Provider, MD  citalopram (CELEXA) 10 MG tablet Take 1 tablet (10 mg total) by mouth daily. 01/24/13  Yes Garvin Fila, MD  levETIRAcetam (KEPPRA) 250 MG tablet Take 1 tablet (250 mg total) by mouth 2 (two) times daily. 04/25/13  Yes Garvin Fila, MD  loperamide (IMODIUM A-D) 2 MG tablet Take 1 tablet (2 mg total) by mouth 2 (two) times daily before a meal. 05/24/13  Yes Rogene Houston, MD  Melatonin 5 MG TABS Take 5 mg by mouth at bedtime.   Yes Historical Provider, MD  pantoprazole (PROTONIX) 40 MG tablet Take 1 tablet (40 mg total) by mouth daily before breakfast. 05/24/13  Yes Rogene Houston, MD  saccharomyces boulardii (FLORASTOR) 250 MG capsule  Take 250 mg by mouth 2 (two) times daily.   Yes Historical Provider, MD  thiamine 100 MG tablet Take 1 tablet (100 mg total) by mouth daily. 11/16/12  Yes Maricela Curet, MD   Physical Exam: Filed Vitals:   07/08/13 1759  BP: 116/64  Pulse: 91  Temp: 99.1 F (37.3 C)  Resp: 20    BP 116/64  Pulse 91  Temp(Src) 99.1 F (37.3 C) (Oral)  Resp 20  Ht 5\' 1"  (1.549 m)  Wt 47.628 kg (105 lb)  BMI 19.85 kg/m2  SpO2 100%  General:  Appears uncomfortable due to abdominal pain Eyes: PERRL, normal lids, irises & conjunctiva ENT:  grossly normal hearing, lips & tongue Neck: no LAD, masses or thyromegaly Cardiovascular: RRR, no m/r/g. No LE edema. Telemetry: SR, no arrhythmias  Respiratory: CTA bilaterally, no w/r/r. Normal respiratory effort. Abdomen: soft, diffusely tender, positive bowel sounds Skin: no rash or induration seen on limited exam Musculoskeletal: grossly normal tone BUE/BLE Psychiatric: grossly normal mood and affect, speech fluent and appropriate Neurologic: grossly non-focal.          Labs on Admission:  Basic Metabolic Panel:  Recent Labs Lab 07/08/13 1345  NA 141  K 4.0  CL 101  CO2 29  GLUCOSE 104*  BUN 9  CREATININE 0.71  CALCIUM 9.6   Liver Function Tests:  Recent Labs Lab 07/08/13 1345  AST 21  ALT 16  ALKPHOS 46  BILITOT 0.6  PROT 6.9  ALBUMIN 4.1    Recent Labs Lab 07/08/13 1345  LIPASE 29   No results found for this basename: AMMONIA,  in the last 168 hours CBC:  Recent Labs Lab 07/08/13 1345  WBC 8.7  NEUTROABS 7.7  HGB 14.5  HCT 43.3  MCV 97.1  PLT 235   Cardiac Enzymes: No results found for this basename: CKTOTAL, CKMB, CKMBINDEX, TROPONINI,  in the last 168 hours  BNP (last 3 results) No results found for this basename: PROBNP,  in the last 8760 hours CBG: No results found for this basename: GLUCAP,  in the last 168 hours  Radiological Exams on Admission: Dg Chest 2 View  07/08/2013   CLINICAL DATA:  Chest abdominal pain.  Weakness.  EXAM: CHEST  2 VIEW  COMPARISON:  None.  FINDINGS: The heart is normal in size. There is tortuosity, ectasia and calcification of the thoracic aorta. The pulmonary hila appear normal. The lungs demonstrate emphysematous changes. No acute pulmonary findings. No pleural effusion. The bony thorax is intact.  IMPRESSION: Emphysematous changes but no acute pulmonary findings.   Electronically Signed   By: Kalman Jewels M.D.   On: 07/08/2013 15:13   Ct Abdomen Pelvis W Contrast  07/08/2013   CLINICAL DATA:  Mid  abdominal pain radiating into the back. Nausea and vomiting. Diarrhea.  EXAM: CT ABDOMEN AND PELVIS WITH CONTRAST  TECHNIQUE: Multidetector CT imaging of the abdomen and pelvis was performed using the standard protocol following bolus administration of intravenous contrast.  CONTRAST:  124mL OMNIPAQUE IOHEXOL 300 MG/ML IV. Oral contrast was also administered.  COMPARISON:  None.  FINDINGS: Wall thickening involving the cecum, ascending colon and proximal and mid transverse colon, associated with hyperemia. No abnormal fluid collection to suggest abscess. Interposition of the hepatic flexure of the colon between the liver and the right hemidiaphragm. No ascites. Large stool burden throughout the remainder of the colon. Scattered sigmoid colon diverticula without evidence of acute diverticulitis. Normal appearing stomach and small bowel.  Multiple small low-attenuation  lesions throughout the liver, the largest approximating 8 mm in the medial segment left lobe. The largest lesions represent simple cyst, and I suspect that the smaller ones are cysts as well, given their conspicuity for their small size. Normal spleen. Borderline pancreatic ductal dilation, as there is a stone within the pancreatic duct in the head of the pancreas. A calcification is also present within the pancreatic head parenchyma. Numerous gallstones are present within the gallbladder, including several in the gallbladder neck. Extrahepatic bile duct upper normal in caliber approximating 8 mm. High attenuation material in the distal common bile duct at the ampulla, likely sludge. No CT evidence for acute cholecystitis.  Severe left hydronephrosis without associated ureteral dilation. Delayed excretion of contrast by the left kidney. Numerous small cortical cysts involving the left kidney. Normal-appearing right kidney. Extensive aortoiliofemoral atherosclerosis without aneurysm. No significant lymphadenopathy.  Urinary bladder unremarkable. Uterus  atrophic consistent with age. No adnexal masses or free pelvic fluid. Numerous pelvic phleboliths.  Bone window images demonstrate osseous demineralization, degenerative changes throughout the lumbar spine, degenerative changes involving the sacroiliac joints. Visualized lung bases clear apart from minimal scarring in the lower lobes. Emphysematous changes are present.  IMPRESSION: 1. Colitis involving the cecum, ascending colon, and proximal and mid transverse colon. No evidence of abscess. 2. Severe left hydronephrosis without associated ureteral dilation. Mild obstruction of the left kidney with delayed contrast excretion. 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 3. Cholelithiasis without CT evidence of acute cholecystitis. 4. Borderline extrahepatic biliary ductal dilation with probable sludge in the distal common bile duct. 5. Chronic calcific pancreatitis involving the pancreatic head, including a stone in the pancreatic duct. 6. Sigmoid colon diverticulosis without evidence of acute diverticulitis. 7. Numerous hepatic lesions felt to represent simple cysts, given their conspicuity for their small size. The largest lesions are simple cysts. 8. Emphysematous changes in the lower lobes.   Electronically Signed   By: Evangeline Dakin M.D.   On: 07/08/2013 15:19    Assessment/Plan Active Problems:   Seizures secondary to [facility]   Microscopic colitis   Colitis   Nausea and vomiting   Abdominal pain, unspecified site   1. Colitis, possibly infectious. Patient does have a history of chronic microscopic colitis, although her new symptoms appear to be more acute. Will check stool for C. difficile as well as GI pathogen panel. She has been started on ciprofloxacin and Flagyl. Will likely need GI input once available. Patient will be kept on clear liquids for now which can be advanced as tolerated 2. Microscopic colitis. Hold Imodium due to possible C. difficile. We'll  hold budesonide as well 3. Nausea and vomiting. Treat with antiemetics 4. Seizures secondary to recent subarachnoid hemorrhage. We'll continue the patient on oral Keppra. 5. Abdominal pain. Treat with pain medication  Code Status: Limited code. She would not want CPR in the event of cardiopulmonary arrest Family Communication: Discussed with patient and daughter at the bedside Disposition Plan: Discharge home once improved  Time spent: 65mins  Lia Vigilante [facility] Pager [phone]  **Disclaimer: This note may have been dictated with voice recognition software. Similar sounding words can inadvertently be transcribed and this note may contain transcription errors which may not have been corrected upon publication of note.**

## 2013-07-09 DIAGNOSIS — R112 Nausea with vomiting, unspecified: Secondary | ICD-10-CM

## 2013-07-09 LAB — CBC
HEMATOCRIT: 37 % (ref 36.0–46.0)
HEMOGLOBIN: 12.3 g/dL (ref 12.0–15.0)
MCH: 32.5 pg (ref 26.0–34.0)
MCHC: 33.2 g/dL (ref 30.0–36.0)
MCV: 97.9 fL (ref 78.0–100.0)
Platelets: 193 10*3/uL (ref 150–400)
RBC: 3.78 MIL/uL — ABNORMAL LOW (ref 3.87–5.11)
RDW: 15.1 % (ref 11.5–15.5)
WBC: 7.9 10*3/uL (ref 4.0–10.5)

## 2013-07-09 LAB — BASIC METABOLIC PANEL
Anion gap: 9 (ref 5–15)
BUN: 6 mg/dL (ref 6–23)
CALCIUM: 8.7 mg/dL (ref 8.4–10.5)
CO2: 28 mEq/L (ref 19–32)
Chloride: 104 mEq/L (ref 96–112)
Creatinine, Ser: 0.7 mg/dL (ref 0.50–1.10)
GFR calc Af Amer: >90 mL/min (ref >90)
GFR, EST NON AFRICAN AMERICAN: 79 mL/min — AB (ref >90)
GLUCOSE: 114 mg/dL — AB (ref 70–99)
Potassium: 3.2 mEq/L — ABNORMAL LOW (ref 3.7–5.3)
SODIUM: 141 meq/L (ref 137–147)

## 2013-07-09 LAB — CLOSTRIDIUM DIFFICILE BY PCR: CDIFFPCR: NEGATIVE

## 2013-07-09 MED ORDER — OXYCODONE-ACETAMINOPHEN 5-325 MG PO TABS
1.0000 | ORAL_TABLET | ORAL | Status: DC | PRN
  Administered 2013-07-09 – 2013-07-11 (×4): 1 via ORAL
  Filled 2013-07-09 (×4): qty 1

## 2013-07-09 MED ORDER — LEVETIRACETAM 250 MG PO TABS
250.0000 mg | ORAL_TABLET | Freq: Two times a day (BID) | ORAL | Status: DC
  Administered 2013-07-09 – 2013-07-12 (×7): 250 mg via ORAL
  Filled 2013-07-09 (×11): qty 1

## 2013-07-09 MED ORDER — SODIUM CHLORIDE 0.9 % IV BOLUS (SEPSIS)
500.0000 mL | Freq: Once | INTRAVENOUS | Status: AC
  Administered 2013-07-10: 500 mL via INTRAVENOUS

## 2013-07-09 MED ORDER — POTASSIUM CHLORIDE CRYS ER 20 MEQ PO TBCR
40.0000 meq | EXTENDED_RELEASE_TABLET | Freq: Once | ORAL | Status: AC
  Administered 2013-07-09: 40 meq via ORAL
  Filled 2013-07-09: qty 2

## 2013-07-09 NOTE — Progress Notes (Signed)
Utilization review Completed Ossie Beltran RN BSN   

## 2013-07-09 NOTE — Progress Notes (Signed)
[facility] PROGRESS NOTE  Bridget Ball:[id number] DOB: [birth year] DOA: 07/08/2013 PCP: Delphina Cahill, MD  Assessment/Plan: 1. Colitis, possibly infectious. The patient does have history of chronic microscopic colitis. Currently she is on ciprofloxacin and Flagyl. She's not had any bowel movements since coming into the hospital. Once she moves her bowels, will sent for C. difficile and GI pathogen panel. [facility] request gastroenterology input. Her chronic medications for microscopic colitis including Imodium and budesonide are on hold. 2. Nausea and vomiting. It appears to be improving. Advance to full liquids. 3. Left-sided hydronephrosis. Incidental finding on CT. ER physician discussed case with urology, Dr. Junious Silk. He was not felt that any acute intervention was required at this time. Unless patient develops renal failure, plan is for observation. 4. Seizures secondary to recent subarachnoid hemorrhage. Continue Keppra 5. Hypokalemia. Replace 6. COPD. Appears stable. Continue Symbicort  Code Status: Partial code, no cpr in the event of cardiopulmonary arrest Family Communication: discussed with patient Disposition Plan: discharge home once improved   Consultants:    Procedures:    Antibiotics:  Flagyl 7/4>>  cipro 7/4>>  HPI/Subjective: Complaining of headache, no bowel movements, no nausea or vomiting, tolerated clear liquids  Objective: Filed Vitals:   07/09/13 0530  BP: 109/65  Pulse:   Temp: 98.1 F (36.7 C)  Resp: 20    Intake/Output Summary (Last 24 hours) at 07/09/13 1109 Last data filed at 07/09/13 0943  Gross per 24 hour  Intake   1405 ml  Output   1200 ml  Net    205 ml   Filed Weights   07/08/13 1151 07/08/13 1759  Weight: 47.628 kg (105 lb) 47.628 kg (105 lb)    Exam:   General:  NAD  Cardiovascular: S1, S2 RRR  Respiratory:  CTA B  Abdomen: soft, diffusely tender, bs+  Musculoskeletal: no edema b/l   Data Reviewed: Basic  Metabolic Panel:  Recent Labs Lab 07/08/13 1345 07/09/13 0631  NA 141 141  K 4.0 3.2*  CL 101 104  CO2 29 28  GLUCOSE 104* 114*  BUN 9 6  CREATININE 0.71 0.70  CALCIUM 9.6 8.7   Liver Function Tests:  Recent Labs Lab 07/08/13 1345  AST 21  ALT 16  ALKPHOS 46  BILITOT 0.6  PROT 6.9  ALBUMIN 4.1    Recent Labs Lab 07/08/13 1345  LIPASE 29   No results found for this basename: AMMONIA,  in the last 168 hours CBC:  Recent Labs Lab 07/08/13 1345 07/09/13 0631  WBC 8.7 7.9  NEUTROABS 7.7  --   HGB 14.5 12.3  HCT 43.3 37.0  MCV 97.1 97.9  PLT 235 193   Cardiac Enzymes: No results found for this basename: CKTOTAL, CKMB, CKMBINDEX, TROPONINI,  in the last 168 hours BNP (last 3 results) No results found for this basename: PROBNP,  in the last 8760 hours CBG: No results found for this basename: GLUCAP,  in the last 168 hours  No results found for this or any previous visit (from the past 240 hour(s)).   Studies: Dg Chest 2 View  07/08/2013   CLINICAL DATA:  Chest abdominal pain.  Weakness.  EXAM: CHEST  2 VIEW  COMPARISON:  None.  FINDINGS: The heart is normal in size. There is tortuosity, ectasia and calcification of the thoracic aorta. The pulmonary hila appear normal. The lungs demonstrate emphysematous changes. No acute pulmonary findings. No pleural effusion. The bony thorax is intact.  IMPRESSION: Emphysematous changes but no acute pulmonary  findings.   Electronically Signed   By: Kalman Jewels M.D.   On: 07/08/2013 15:13   Ct Abdomen Pelvis W Contrast  07/08/2013   CLINICAL DATA:  Mid abdominal pain radiating into the back. Nausea and vomiting. Diarrhea.  EXAM: CT ABDOMEN AND PELVIS WITH CONTRAST  TECHNIQUE: Multidetector CT imaging of the abdomen and pelvis was performed using the standard protocol following bolus administration of intravenous contrast.  CONTRAST:  157mL OMNIPAQUE IOHEXOL 300 MG/ML IV. Oral contrast was also administered.  COMPARISON:  None.   FINDINGS: Wall thickening involving the cecum, ascending colon and proximal and mid transverse colon, associated with hyperemia. No abnormal fluid collection to suggest abscess. Interposition of the hepatic flexure of the colon between the liver and the right hemidiaphragm. No ascites. Large stool burden throughout the remainder of the colon. Scattered sigmoid colon diverticula without evidence of acute diverticulitis. Normal appearing stomach and small bowel.  Multiple small low-attenuation lesions throughout the liver, the largest approximating 8 mm in the medial segment left lobe. The largest lesions represent simple cyst, and I suspect that the smaller ones are cysts as well, given their conspicuity for their small size. Normal spleen. Borderline pancreatic ductal dilation, as there is a stone within the pancreatic duct in the head of the pancreas. A calcification is also present within the pancreatic head parenchyma. Numerous gallstones are present within the gallbladder, including several in the gallbladder neck. Extrahepatic bile duct upper normal in caliber approximating 8 mm. High attenuation material in the distal common bile duct at the ampulla, likely sludge. No CT evidence for acute cholecystitis.  Severe left hydronephrosis without associated ureteral dilation. Delayed excretion of contrast by the left kidney. Numerous small cortical cysts involving the left kidney. Normal-appearing right kidney. Extensive aortoiliofemoral atherosclerosis without aneurysm. No significant lymphadenopathy.  Urinary bladder unremarkable. Uterus atrophic consistent with age. No adnexal masses or free pelvic fluid. Numerous pelvic phleboliths.  Bone window images demonstrate osseous demineralization, degenerative changes throughout the lumbar spine, degenerative changes involving the sacroiliac joints. Visualized lung bases clear apart from minimal scarring in the lower lobes. Emphysematous changes are present.  IMPRESSION:  1. Colitis involving the cecum, ascending colon, and proximal and mid transverse colon. No evidence of abscess. 2. Severe left hydronephrosis without associated ureteral dilation. Mild obstruction of the left kidney with delayed contrast excretion. 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 3. Cholelithiasis without CT evidence of acute cholecystitis. 4. Borderline extrahepatic biliary ductal dilation with probable sludge in the distal common bile duct. 5. Chronic calcific pancreatitis involving the pancreatic head, including a stone in the pancreatic duct. 6. Sigmoid colon diverticulosis without evidence of acute diverticulitis. 7. Numerous hepatic lesions felt to represent simple cysts, given their conspicuity for their small size. The largest lesions are simple cysts. 8. Emphysematous changes in the lower lobes.   Electronically Signed   By: Evangeline Dakin M.D.   On: 07/08/2013 15:19    Scheduled Meds: . budesonide-formoterol  2 puff Inhalation BID  . ciprofloxacin  400 mg Intravenous Q12H  . citalopram  10 mg Oral Daily  . heparin  5,000 Units Subcutaneous 3 times per day  . levETIRAcetam  250 mg Oral BID  . metronidazole  500 mg Intravenous Q8H  . potassium chloride  40 mEq Oral Once  . saccharomyces boulardii  250 mg Oral BID  . thiamine  100 mg Oral Daily   Continuous Infusions: . sodium chloride 50 mL/hr at 07/08/13 2225  Active Problems:   Seizures secondary to [facility]   Microscopic colitis   Colitis   Nausea and vomiting   Abdominal pain, unspecified site   Hydronephrosis, left    Time spent: 23mins    MEMON,JEHANZEB  [facility] Pager [phone]. If 7PM-7AM, please contact night-coverage at [email or web address], password [facility] 07/09/2013, 11:09 AM  LOS: 1 day

## 2013-07-10 ENCOUNTER — Inpatient Hospital Stay (HOSPITAL_COMMUNITY): Payer: Medicare Other

## 2013-07-10 DIAGNOSIS — R1011 Right upper quadrant pain: Secondary | ICD-10-CM

## 2013-07-10 DIAGNOSIS — R112 Nausea with vomiting, unspecified: Secondary | ICD-10-CM

## 2013-07-10 DIAGNOSIS — K5289 Other specified noninfective gastroenteritis and colitis: Secondary | ICD-10-CM

## 2013-07-10 DIAGNOSIS — R109 Unspecified abdominal pain: Secondary | ICD-10-CM

## 2013-07-10 LAB — BASIC METABOLIC PANEL
Anion gap: 8 (ref 5–15)
BUN: 5 mg/dL — AB (ref 6–23)
CALCIUM: 8.5 mg/dL (ref 8.4–10.5)
CO2: 26 mEq/L (ref 19–32)
CREATININE: 0.71 mg/dL (ref 0.50–1.10)
Chloride: 109 mEq/L (ref 96–112)
GFR calc Af Amer: >90 mL/min (ref >90)
GFR, EST NON AFRICAN AMERICAN: 78 mL/min — AB (ref >90)
GLUCOSE: 98 mg/dL (ref 70–99)
Potassium: 3.8 mEq/L (ref 3.7–5.3)
Sodium: 143 mEq/L (ref 137–147)

## 2013-07-10 LAB — GI PATHOGEN PANEL BY PCR, STOOL
C difficile toxin A/B: NEGATIVE
CRYPTOSPORIDIUM BY PCR: NEGATIVE
Campylobacter by PCR: NEGATIVE
E COLI (ETEC) LT/ST: NEGATIVE
E COLI (STEC): NEGATIVE
E COLI 0157 BY PCR: NEGATIVE
G lamblia by PCR: NEGATIVE
NOROVIRUS G1/G2: NEGATIVE
Rotavirus A by PCR: NEGATIVE
Salmonella by PCR: NEGATIVE
Shigella by PCR: NEGATIVE

## 2013-07-10 MED ORDER — IOHEXOL 350 MG/ML SOLN
100.0000 mL | Freq: Once | INTRAVENOUS | Status: AC | PRN
  Administered 2013-07-10: 100 mL via INTRAVENOUS

## 2013-07-10 MED ORDER — ZOLPIDEM TARTRATE 5 MG PO TABS
5.0000 mg | ORAL_TABLET | Freq: Once | ORAL | Status: AC
  Administered 2013-07-10: 5 mg via ORAL
  Filled 2013-07-10: qty 1

## 2013-07-10 MED ORDER — CIPROFLOXACIN HCL 250 MG PO TABS
500.0000 mg | ORAL_TABLET | Freq: Two times a day (BID) | ORAL | Status: DC
  Administered 2013-07-10 – 2013-07-12 (×4): 500 mg via ORAL
  Filled 2013-07-10 (×4): qty 2

## 2013-07-10 MED ORDER — METRONIDAZOLE 500 MG PO TABS
250.0000 mg | ORAL_TABLET | Freq: Three times a day (TID) | ORAL | Status: DC
  Administered 2013-07-10 – 2013-07-12 (×6): 250 mg via ORAL
  Filled 2013-07-10 (×6): qty 1

## 2013-07-10 MED ORDER — IOHEXOL 350 MG/ML SOLN: 100.0000 mL | Freq: Once | INTRAVENOUS | Status: AC | PRN

## 2013-07-10 NOTE — Consult Note (Signed)
Referring Provider: No ref. provider found Primary Care Physician:  Delphina Cahill, MD Primary Gastroenterologist:  Dr. Laural Golden  Reason for Consultation:    Right-sided colitis.   HPI:   Patient is an 80 year old Caucasian female who presented to emergency room on the evening of 07/08/2013 with sudden onset of severe abdominal pain. He recalls the pain was across her upper and mid abdomen and was very intense. This pain was associated with nausea and vomiting. She did not experience hematemesis, melena rectal bleeding fever or chills. She did experience urgency and diarrhea earlier in the day. She states she was doing very well since she had been on budesonide for microscopic colitis which was diagnosed about 6 weeks ago when she underwent colonoscopy for chronic nonbloody diarrhea. Colonoscopy was incomplete revealing normal mucosa of sigmoid colon and rectum a biopsy revealed microscopic colitis. She also underwent EGD with duodenal biopsy which was negative for celiac disease. These studies were performed on 05/24/2013. Evaluation in emergency room revealed her to have normal CBC, serum lipase LFTs and abdominopelvic CT revealed multiple findings including right sided colitis. CT results are reviewed in detail below. Patient was admitted to hospitalist service and begun on IV ciprofloxacin and metronidazole. C. difficile by PCR was negative. GI pathogen panel is pending. Patient feels much better. She has not experienced any more episodes of pain. She is hungry. She is not having any difficulty with full liquid diet. Patient states that she has not lost any more weight. When she was seen in the office on 04/20/2013 she weighed 104 pounds and she stated that she had lost 15 pounds. She is widowed. She lives alone but her daughter lives about 15 minutes away from her. Has 4 other children living. She lost one daughter had 8 months of congenital urologic problems. He drinks 2 glasses of wine every night.  She smoked cigarettes for several years but quit 10 years ago. Patient is not read that she has gallstones.    Past Medical History  Diagnosis Date  . COPD (chronic obstructive pulmonary disease)   . Arthritis   . Stroke     TIA four years ago  . Depression   . Cancer     Skin cancer  . Seizures     x 2 last year due to bleeding to the brain.  . Memory difficulties   . Chronic diarrhea   . Hiatal hernia     Past Surgical History  Procedure Laterality Date  . Appendectomy    . Skin cancer excision    . Tubal ligation    . Colonoscopy N/A 05/24/2013    Procedure: COLONOSCOPY;  Surgeon: Rogene Houston, MD;  [facility]: [facility];  Service: Endoscopy;  Laterality: N/A;  100  . Esophagogastroduodenoscopy N/A 05/24/2013    Procedure: ESOPHAGOGASTRODUODENOSCOPY (EGD);  Surgeon: Rogene Houston, MD;  Location: [facility];  Service: Endoscopy;  Laterality: N/A;    Prior to Admission medications   Medication Sig Start Date End Date Taking? Authorizing Provider  budesonide (ENTOCORT EC) 3 MG 24 hr capsule Take 3 mg by mouth 2 (two) times daily.   Yes Historical Provider, MD  budesonide-formoterol (SYMBICORT) 160-4.5 MCG/ACT inhaler Inhale 2 puffs into the lungs 2 (two) times daily.   Yes Historical Provider, MD  citalopram (CELEXA) 10 MG tablet Take 1 tablet (10 mg total) by mouth daily. 01/24/13  Yes Garvin Fila, MD  levETIRAcetam (KEPPRA) 250 MG tablet Take 1 tablet (250 mg total) by mouth 2 (  two) times daily. 04/25/13  Yes Garvin Fila, MD  loperamide (IMODIUM A-D) 2 MG tablet Take 1 tablet (2 mg total) by mouth 2 (two) times daily before a meal. 05/24/13  Yes Rogene Houston, MD  Melatonin 5 MG TABS Take 5 mg by mouth at bedtime.   Yes Historical Provider, MD  pantoprazole (PROTONIX) 40 MG tablet Take 1 tablet (40 mg total) by mouth daily before breakfast. 05/24/13  Yes Rogene Houston, MD  saccharomyces boulardii (FLORASTOR) 250 MG capsule Take 250 mg by mouth 2 (two) times  daily.   Yes Historical Provider, MD  thiamine 100 MG tablet Take 1 tablet (100 mg total) by mouth daily. 11/16/12  Yes Maricela Curet, MD    Current Facility-Administered Medications  Medication Dose Route Frequency Provider Last Rate Last Dose  . 0.9 %  sodium chloride infusion   Intravenous Continuous Kathie Dike, MD 50 mL/hr at 07/09/13 1300    . acetaminophen (TYLENOL) tablet 650 mg  650 mg Oral Q6H PRN Kathie Dike, MD   650 mg at 07/10/13 0533   Or  . acetaminophen (TYLENOL) suppository 650 mg  650 mg Rectal Q6H PRN Kathie Dike, MD      . albuterol (PROVENTIL) (2.5 MG/3ML) 0.083% nebulizer solution 2.5 mg  2.5 mg Nebulization Q2H PRN Kathie Dike, MD      . budesonide-formoterol (SYMBICORT) 160-4.5 MCG/ACT inhaler 2 puff  2 puff Inhalation BID Kathie Dike, MD   2 puff at 07/10/13 8242  . ciprofloxacin (CIPRO) IVPB 400 mg  400 mg Intravenous Q12H Kathie Dike, MD   400 mg at 07/10/13 0528  . citalopram (CELEXA) tablet 10 mg  10 mg Oral Daily Kathie Dike, MD   10 mg at 07/09/13 1003  . heparin injection 5,000 Units  5,000 Units Subcutaneous 3 times per day Kathie Dike, MD   5,000 Units at 07/10/13 0528  . HYDROmorphone (DILAUDID) injection 0.5 mg  0.5 mg Intravenous Q2H PRN Kathie Dike, MD   0.5 mg at 07/09/13 1252  . levETIRAcetam (KEPPRA) tablet 250 mg  250 mg Oral BID Kathie Dike, MD   250 mg at 07/09/13 2110  . metroNIDAZOLE (FLAGYL) IVPB 500 mg  500 mg Intravenous Q8H Kathie Dike, MD   500 mg at 07/10/13 0213  . ondansetron (ZOFRAN) tablet 4 mg  4 mg Oral Q6H PRN Kathie Dike, MD       Or  . ondansetron (ZOFRAN) injection 4 mg  4 mg Intravenous Q6H PRN Kathie Dike, MD   4 mg at 07/09/13 1756  . oxyCODONE-acetaminophen (PERCOCET/ROXICET) 5-325 MG per tablet 1 tablet  1 tablet Oral Q4H PRN Kathie Dike, MD   1 tablet at 07/09/13 1957  . promethazine (PHENERGAN) injection 6.25 mg  6.25 mg Intravenous Q6H PRN Kathie Dike, MD      .  saccharomyces boulardii (FLORASTOR) capsule 250 mg  250 mg Oral BID Kathie Dike, MD   250 mg at 07/09/13 2110  . thiamine (VITAMIN B-1) tablet 100 mg  100 mg Oral Daily Kathie Dike, MD   100 mg at 07/09/13 1003    Allergies as of 07/08/2013 - Review Complete 07/08/2013  Allergen Reaction Noted  . Penicillins Anaphylaxis 11/12/2012    Family History  Problem Relation Age of Onset  . Diabetes Mother     History   Social History  . Marital Status: Widowed    Spouse Name: N/A    Number of Children: 6  . Years of Education: college  Occupational History  . retired    Social History Main Topics  . Smoking status: Former Smoker -- 1.00 packs/day for 50 years    Quit date: 11/13/1997  . Smokeless tobacco: Not on file  . Alcohol Use: 4.2 oz/week    7 Glasses of wine per week     Comment: 2 glasses a night wine (none in a year)  . Drug Use: No  . Sexual Activity: Not Currently   Other Topics Concern  . Not on file   Social History Narrative  . No narrative on file    Review of Systems: See HPI, otherwise normal ROS  Physical Exam: Temp:  [98 F (36.7 C)-98.7 F (37.1 C)] 98 F (36.7 C) (07/06 0550) Pulse Rate:  [72-83] 83 (07/06 0550) Resp:  [20] 20 (07/06 0550) BP: (79-110)/(40-60) 110/60 mmHg (07/06 0553) SpO2:  [90 %-99 %] 99 % (07/06 0724)  Patient is well developed and Caucasian female who is in no acute distress. Conjunctiva is pink. Sclerae nonicteric. Oropharyngeal mucosa is normal. She has upper and lower dentures in place. No neck masses or thyromegaly noted. Cardiac exam with regular rhythm normal S1 and S2. No murmur or gallop noted. Auscultation of lungs reveal diminished intensity of breath sounds bilaterally no rales or rhonchi noted. Abdomen symmetrical. Bowel sounds are normal. On palpation the abdomen is soft and nontender without organomegaly or masses. No clubbing or peripheral edema noted.  Lab Results:  Recent Labs  07/08/13 1345  07/09/13 0631  WBC 8.7 7.9  HGB 14.5 12.3  HCT 43.3 37.0  PLT 235 193   BMET  Recent Labs  07/08/13 1345 07/09/13 0631 07/10/13 0608  NA 141 141 143  K 4.0 3.2* 3.8  CL 101 104 109  CO2 29 28 26   GLUCOSE 104* 114* 98  BUN 9 6 5*  CREATININE 0.71 0.70 0.71  CALCIUM 9.6 8.7 8.5   LFT  Recent Labs  07/08/13 1345  PROT 6.9  ALBUMIN 4.1  AST 21  ALT 16  ALKPHOS 46  BILITOT 0.6   PT/INR No results found for this basename: LABPROT, INR,  in the last 72 hours Hepatitis Panel No results found for this basename: HEPBSAG, HCVAB, HEPAIGM, HEPBIGM,  in the last 72 hours  Studies/Results: Dg Chest 2 View  07/08/2013   CLINICAL DATA:  Chest abdominal pain.  Weakness.  EXAM: CHEST  2 VIEW  COMPARISON:  None.  FINDINGS: The heart is normal in size. There is tortuosity, ectasia and calcification of the thoracic aorta. The pulmonary hila appear normal. The lungs demonstrate emphysematous changes. No acute pulmonary findings. No pleural effusion. The bony thorax is intact.  IMPRESSION: Emphysematous changes but no acute pulmonary findings.   Electronically Signed   By: Kalman Jewels M.D.   On: 07/08/2013 15:13   Ct Abdomen Pelvis W Contrast  07/08/2013   CLINICAL DATA:  Mid abdominal pain radiating into the back. Nausea and vomiting. Diarrhea.  EXAM: CT ABDOMEN AND PELVIS WITH CONTRAST  TECHNIQUE: Multidetector CT imaging of the abdomen and pelvis was performed using the standard protocol following bolus administration of intravenous contrast.  CONTRAST:  160mL OMNIPAQUE IOHEXOL 300 MG/ML IV. Oral contrast was also administered.  COMPARISON:  None.  FINDINGS: Wall thickening involving the cecum, ascending colon and proximal and mid transverse colon, associated with hyperemia. No abnormal fluid collection to suggest abscess. Interposition of the hepatic flexure of the colon between the liver and the right hemidiaphragm. No ascites. Large stool burden throughout  the remainder of the colon.  Scattered sigmoid colon diverticula without evidence of acute diverticulitis. Normal appearing stomach and small bowel.  Multiple small low-attenuation lesions throughout the liver, the largest approximating 8 mm in the medial segment left lobe. The largest lesions represent simple cyst, and I suspect that the smaller ones are cysts as well, given their conspicuity for their small size. Normal spleen. Borderline pancreatic ductal dilation, as there is a stone within the pancreatic duct in the head of the pancreas. A calcification is also present within the pancreatic head parenchyma. Numerous gallstones are present within the gallbladder, including several in the gallbladder neck. Extrahepatic bile duct upper normal in caliber approximating 8 mm. High attenuation material in the distal common bile duct at the ampulla, likely sludge. No CT evidence for acute cholecystitis.  Severe left hydronephrosis without associated ureteral dilation. Delayed excretion of contrast by the left kidney. Numerous small cortical cysts involving the left kidney. Normal-appearing right kidney. Extensive aortoiliofemoral atherosclerosis without aneurysm. No significant lymphadenopathy.  Urinary bladder unremarkable. Uterus atrophic consistent with age. No adnexal masses or free pelvic fluid. Numerous pelvic phleboliths.  Bone window images demonstrate osseous demineralization, degenerative changes throughout the lumbar spine, degenerative changes involving the sacroiliac joints. Visualized lung bases clear apart from minimal scarring in the lower lobes. Emphysematous changes are present.  IMPRESSION: 1. Colitis involving the cecum, ascending colon, and proximal and mid transverse colon. No evidence of abscess. 2. Severe left hydronephrosis without associated ureteral dilation. Mild obstruction of the left kidney with delayed contrast excretion. 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 3.  Cholelithiasis without CT evidence of acute cholecystitis. 4. Borderline extrahepatic biliary ductal dilation with probable sludge in the distal common bile duct. 5. Chronic calcific pancreatitis involving the pancreatic head, including a stone in the pancreatic duct. 6. Sigmoid colon diverticulosis without evidence of acute diverticulitis. 7. Numerous hepatic lesions felt to represent simple cysts, given their conspicuity for their small size. The largest lesions are simple cysts. 8. Emphysematous changes in the lower lobes.   Electronically Signed   By: Evangeline Dakin M.D.   On: 07/08/2013 15:19    I have gone over abdominal pelvic CT with Dr. Lavonia Dana. She has multiple small hepatic cysts, mildly dilated CBD with sludge distally. Few small calcifications in pancreatic head and small calcified stone in pancreatic duct. There is wall thickening to cecal ascending colon and proximal transverse colon. She also has left hydronephrosis and sigmoid diverticulosis. Extensive arthrosclerotic changes noted to the aorta with dense calcification at take off of SMA and there appears to be some narrowing at the region of celiac trunk.  Assessment; #1. Right-sided colitis. Patient has history of chronic nonbloody diarrhea and was diagnosed with microscopic colitis 6 weeks ago and responded to oral budesonide. She presents with sudden onset of abdominal pain and nonbloody diarrhea. She is symptomatically improved with empiric antibiotic therapy. GI pathogen panel is pending. Patient has extensive atherosclerotic changes to aorta and to take off of  SMA. She may also have disease involving celiac trunk. I am concerned that she may have ischemic colitis. Nations proximal colon could not be examined at recent colonoscopy she has very tortuous sigmoid colon. May consider virtual colonoscopy when she has fully recovered from the acute illness. #2. History of microscopic colitis responding to oral budesonide. Agree with  holding budesonide for now. #3. Pancreatico biliary tract disease. Patient has cholelithiasis and mildly dilated CBD with sludge; she also  has scant pancreatic calcification and stone in pancreatic duct. She does not appear to have biliary symptoms. She will have to be referred to tertiary center an outpatient basis.  Recommendations; Advance diet. LFTs in am. CTA abdomen. Referral to tertiary center for ERCP regarding pancreatic duct stone.   LOS: 2 days   Kimie Pidcock U  07/10/2013, 8:58 AM

## 2013-07-10 NOTE — Progress Notes (Signed)
INITIAL NUTRITION ASSESSMENT  DOCUMENTATION CODES Per approved criteria  -Non-severe (moderate) malnutrition in the context of chronic illness   INTERVENTION: Ensure Complete po BID, each supplement provides 350 kcal and 13 grams of protein   ProStat 30 ml BID (each 30 ml provides 100 kcal, 15 gr protein)   NUTRITION DIAGNOSIS: Inadequate oral intake related to abdominal pain, diarrhea as evidenced by wt loss of      .   Goal: Pt to meet >/= 90% of their estimated nutrition needs     Monitor: diet advancement, po intake, labs and wt trends    Reason for Assessment: Malnutrition Screen Score =  3  80 y.o. female   ASSESSMENT: Pt is very pleasant 80 yo who presents with abdominal pain nad hx of chronic diarrhea. Abdominal CT-findings 7/6-resolving colitis with inflammatory changes still in the mesentery.  Pt has hx of significant weight loss January-April this year-11%. She has maintained her current weight since April. Pt c/o of poor tolerance of oral intake since January. She has muscle wasting to clavicles, quadriceps, patella and fat loss to arms. Pt says she's had diarrhea for so long that she has become "afraid" to eat.  Pt meets criteria for moderate MALNUTRITION in the context of chronic illness as evidenced by chronic diarrhea, 11% weight loss and moderate muscle wasting.  Height: Ht Readings from Last 1 Encounters:  07/08/13 5\' 1"  (1.549 m)    Weight: Wt Readings from Last 1 Encounters:  07/08/13 105 lb (47.628 kg)    Ideal Body Weight: 105#  % Ideal Body Weight: 100%  Wt Readings from Last 10 Encounters:  07/08/13 105 lb (47.628 kg)  06/27/13 104 lb 14.4 oz (47.582 kg)  05/24/13 106 lb (48.081 kg)  05/24/13 106 lb (48.081 kg)  04/25/13 106 lb (48.081 kg)  04/20/13 105 lb 8 oz (47.854 kg)  01/24/13 118 lb (53.524 kg)  11/20/12 116 lb 10 oz (52.9 kg)  11/13/12 122 lb 8 oz (55.566 kg)    Usual Body Weight: 115-120#  % Usual Body Weight: 91%  BMI:   Body mass index is 19.85 kg/(m^2). normal range  Estimated Nutritional Needs: Kcal: 1200-1400 Protein: 56-66 gr Fluid:1.2-1.4 liters daily   Skin: intact  Diet Order: Full Liquid  EDUCATION NEEDS: -No education needs identified at this time   Intake/Output Summary (Last 24 hours) at 07/10/13 0619 Last data filed at 07/10/13 0559  Gross per 24 hour  Intake    360 ml  Output   1200 ml  Net   -840 ml    Last BM: 07/09/13 diarrhea   Labs:   Recent Labs Lab 07/08/13 1345 07/09/13 0631  NA 141 141  K 4.0 3.2*  CL 101 104  CO2 29 28  BUN 9 6  CREATININE 0.71 0.70  CALCIUM 9.6 8.7  GLUCOSE 104* 114*    CBG (last 3)  No results found for this basename: GLUCAP,  in the last 72 hours  Scheduled Meds: . budesonide-formoterol  2 puff Inhalation BID  . ciprofloxacin  400 mg Intravenous Q12H  . citalopram  10 mg Oral Daily  . heparin  5,000 Units Subcutaneous 3 times per day  . levETIRAcetam  250 mg Oral BID  . metronidazole  500 mg Intravenous Q8H  . saccharomyces boulardii  250 mg Oral BID  . thiamine  100 mg Oral Daily    Continuous Infusions: . sodium chloride 50 mL/hr at 07/09/13 1300    Past Medical History  Diagnosis Date  .  COPD (chronic obstructive pulmonary disease)   . Arthritis   . Stroke     TIA four years ago  . Depression   . Cancer     Skin cancer  . Seizures     x 2 last year due to bleeding to the brain.  . Memory difficulties   . Chronic diarrhea   . Hiatal hernia     Past Surgical History  Procedure Laterality Date  . Appendectomy    . Skin cancer excision    . Tubal ligation    . Colonoscopy N/A 05/24/2013    Procedure: COLONOSCOPY;  Surgeon: Rogene Houston, MD;  Location: [facility];  Service: Endoscopy;  Laterality: N/A;  100  . Esophagogastroduodenoscopy N/A 05/24/2013    Procedure: ESOPHAGOGASTRODUODENOSCOPY (EGD);  Surgeon: Rogene Houston, MD;  Location: [facility];  Service: Endoscopy;  Laterality: N/A;    Colman Cater MS,RD,CSG,LDN Office: [phone] Pager: [phone]

## 2013-07-10 NOTE — Progress Notes (Signed)
CTA abdomen reviewed; No evidence of significant disease to celiac trunk or SMA IMA is also patent; Will switch her to oral ciprofloxacin and metronidazole.

## 2013-07-10 NOTE — Progress Notes (Signed)
[facility] PROGRESS NOTE  Bridget Ball LPF:[id number] DOB: 03/23/1928 DOA: 07/08/2013 PCP: Delphina Cahill, MD  Assessment/Plan: 1. Colitis, possibly infectious. The patient does have history of chronic microscopic colitis. Currently she is on ciprofloxacin and Flagyl. Clostridium difficile is negative. GI pathogen panel is in process. Appreciate gastroenterology input. Budesonide and Imodium are currently on hold. Plans are for CT angiogram of abdomen to rule out ischemic colitis. 2. Nausea and vomiting. It appears to be improving. Advance to solid diet. 3. Left-sided hydronephrosis. Incidental finding on CT. ER physician discussed case with urology, Dr. Junious Silk. He was not felt that any acute intervention was required at this time. Unless patient develops renal failure, plan is for observation. 4. Seizures secondary to recent subarachnoid hemorrhage. Continue Keppra 5. Hypokalemia. Replace 6. COPD. Appears stable. Continue Symbicort  Code Status: Partial code, no cpr in the event of cardiopulmonary arrest Family Communication: discussed with patient Disposition Plan: discharge home once improved   Consultants:  Gastroenterology  Procedures:    Antibiotics:  Flagyl 7/4>>  cipro 7/4>>  HPI/Subjective: No nausea or vomiting. Tolerating liquids. Abdominal pain appears to be improving  Objective: Filed Vitals:   07/10/13 1403  BP: 106/54  Pulse: 94  Temp: 98.6 F (37 C)  Resp: 20    Intake/Output Summary (Last 24 hours) at 07/10/13 2027 Last data filed at 07/10/13 1806  Gross per 24 hour  Intake 2901.67 ml  Output   1000 ml  Net 1901.67 ml   Filed Weights   07/08/13 1151 07/08/13 1759  Weight: 47.628 kg (105 lb) 47.628 kg (105 lb)    Exam:   General:  NAD  Cardiovascular: S1, S2 RRR  Respiratory:  CTA B  Abdomen: soft, nontender, bs+  Musculoskeletal: no edema b/l   Data Reviewed: Basic Metabolic Panel:  Recent Labs Lab 07/08/13 1345  07/09/13 0631 07/10/13 0608  NA 141 141 143  K 4.0 3.2* 3.8  CL 101 104 109  CO2 29 28 26   GLUCOSE 104* 114* 98  BUN 9 6 5*  CREATININE 0.71 0.70 0.71  CALCIUM 9.6 8.7 8.5   Liver Function Tests:  Recent Labs Lab 07/08/13 1345  AST 21  ALT 16  ALKPHOS 46  BILITOT 0.6  PROT 6.9  ALBUMIN 4.1    Recent Labs Lab 07/08/13 1345  LIPASE 29   No results found for this basename: AMMONIA,  in the last 168 hours CBC:  Recent Labs Lab 07/08/13 1345 07/09/13 0631  WBC 8.7 7.9  NEUTROABS 7.7  --   HGB 14.5 12.3  HCT 43.3 37.0  MCV 97.1 97.9  PLT 235 193   Cardiac Enzymes: No results found for this basename: CKTOTAL, CKMB, CKMBINDEX, TROPONINI,  in the last 168 hours BNP (last 3 results) No results found for this basename: PROBNP,  in the last 8760 hours CBG: No results found for this basename: GLUCAP,  in the last 168 hours  Recent Results (from the past 240 hour(s))  CLOSTRIDIUM DIFFICILE BY PCR     Status: None   Collection Time    07/09/13  2:07 PM      Result Value Ref Range Status   C difficile by pcr NEGATIVE  NEGATIVE Final     Studies: Ct Angio Abdomen W/cm &/or Wo Contrast  07/10/2013   CLINICAL DATA:  Ischemic colitis.  EXAM: CT ANGIOGRAPHY ABDOMEN  TECHNIQUE: Multidetector CT imaging of the abdomen was performed using the standard protocol during bolus administration of intravenous contrast. Multiplanar reconstructed images  including MIPs were obtained and reviewed to evaluate the vascular anatomy.  CONTRAST:  152mL OMNIPAQUE IOHEXOL 350 MG/ML SOLN  COMPARISON:  CT abdomen and pelvis 07/08/2013  FINDINGS: A small right pleural effusion is present. Mild dependent atelectasis is evident bilaterally. Multiple hepatic cysts are again noted. No focal parenchymal lesions are present. The spleen is within normal limits. The stomach and duodenum are within normal limits. Calcifications are again noted within the head of the pancreas and may be within the pancreatic  duct. The common bile duct is dilated, measuring 10 mm. No obstructing lesion is present. Multiple layering stones are present within the gallbladder. There is no focal inflammation. Previously-seen dilation of the left renal collecting system is resolved. The kidneys are unremarkable.  Dense atherosclerotic calcifications are present in the abdominal aorta. Arterial phase imaging demonstrates atherosclerotic calcifications at the origin of the mesenteric vessels without focal stenosis. The superior mesenteric artery and celiac artery are intact. No definite stenosis is present at the inferior mesenteric artery. The proximal iliac arteries are within normal limits. Bone windows demonstrate a vacuum disc at multiple levels. Rightward curvature of the thoracolumbar spine is evident.  Review of the MIP images confirms the above findings.  IMPRESSION: 1. Atherosclerotic changes without focal stenosis of the mesenteric arteries or renal arteries. 2. Improved appearance of the colon suggesting resolving colitis. Inflammatory changes are still seen in the mesentery. 3. Chololithiasis without evidence for cholecystitis. 4. Resolution of left sided hydronephrosis.   Electronically Signed   By: Lawrence Santiago M.D.   On: 07/10/2013 15:33    Scheduled Meds: . budesonide-formoterol  2 puff Inhalation BID  . ciprofloxacin  500 mg Oral BID  . citalopram  10 mg Oral Daily  . heparin  5,000 Units Subcutaneous 3 times per day  . levETIRAcetam  250 mg Oral BID  . metroNIDAZOLE  250 mg Oral 3 times per day  . saccharomyces boulardii  250 mg Oral BID  . thiamine  100 mg Oral Daily   Continuous Infusions: . sodium chloride 50 mL/hr at 07/10/13 1631    Active Problems:   Seizures secondary to [facility]   Microscopic colitis   Colitis   Nausea and vomiting   Abdominal pain, unspecified site   Hydronephrosis, left    Time spent: 96mins    Shelbee Apgar  [facility] Pager [phone]. If 7PM-7AM, please  contact night-coverage at [email or web address], password [facility] 07/10/2013, 8:27 PM  LOS: 2 days

## 2013-07-10 NOTE — Progress Notes (Signed)
BP 79/40 MD paged and orders given for a 500cc NS bolus. BP up to 99/59.

## 2013-07-11 LAB — HEPATIC FUNCTION PANEL
ALK PHOS: 38 U/L — AB (ref 39–117)
ALT: 10 U/L (ref 0–35)
AST: 12 U/L (ref 0–37)
Albumin: 2.7 g/dL — ABNORMAL LOW (ref 3.5–5.2)
BILIRUBIN TOTAL: 0.3 mg/dL (ref 0.3–1.2)
Bilirubin, Direct: <0.2 mg/dL (ref 0.0–0.3)
Total Protein: 4.8 g/dL — ABNORMAL LOW (ref 6.0–8.3)

## 2013-07-11 MED ORDER — ENSURE COMPLETE PO LIQD
237.0000 mL | Freq: Two times a day (BID) | ORAL | Status: DC
Start: 2013-07-11 — End: 2013-07-12
  Administered 2013-07-11 (×2): 237 mL via ORAL

## 2013-07-11 MED ORDER — LOPERAMIDE HCL 2 MG PO CAPS
2.0000 mg | ORAL_CAPSULE | Freq: Two times a day (BID) | ORAL | Status: DC
  Administered 2013-07-11 – 2013-07-12 (×2): 2 mg via ORAL
  Filled 2013-07-11 (×2): qty 1

## 2013-07-11 MED ORDER — PRO-STAT SUGAR FREE PO LIQD
30.0000 mL | Freq: Two times a day (BID) | ORAL | Status: DC
  Administered 2013-07-11 – 2013-07-12 (×3): 30 mL via ORAL
  Filled 2013-07-11 (×4): qty 30

## 2013-07-11 NOTE — Progress Notes (Signed)
Subjective; Patient states her appetite is so-so. She denies abdominal pain nausea or vomiting. She had 6 stools yesterday and she has had 3 so far today. She is passing less volume each time to She denies melena or rectal bleeding.  Objective; BP 95/63  Pulse 93  Temp(Src) 99 F (37.2 C) (Oral)  Resp 20  Ht 5\' 1"  (1.549 m)  Wt 108 lb 1.6 oz (49.034 kg)  BMI 20.44 kg/m2  SpO2 95% Patient is alert and in no acute distress. Abdomen is symmetrical. Bowel sounds are normal. On palpation abdomen is soft and nontender without organomegaly or masses. No LE edema noted.  Lab data; GI pathogen panel is negative. Bilirubin 0.3, AP 38, AST 12, ALT 10 and albumin 2.7. CTA films reviewed with patient's daughter who is at bedside.  Assessment; #1. Right-sided colitis. Stool studies are negative. She does not have large vessel disease on CTA. Process could be an infection or ischemic colitis due to small vessel disease. No further workup unless colitis recurs. #2. History of microscopic colitis. Patient can resume budesonide at the time of discharge. #3. Pancreatico biliary tract disease. Patient has sludge in the bile duct which is mildly dilated and single stone and pancreatic duct. She will be referred to [facility] for ERCP.  Recommendations; Loperamide 2 mg by mouth twice a day. Continue antibiotic for another 3 days.

## 2013-07-11 NOTE — Discharge Instructions (Signed)
[place] [facility] Stay Proper nutrition can help your body recover from illness and injury.   Foods and beverages high in protein, vitamins, and minerals help rebuild muscle loss, promote healing, & reduce fall risk.   In addition to eating healthy foods, a nutrition shake is an easy, delicious way to get the nutrition you need during and after your hospital stay  It is recommended that you continue to drink 2 bottles per day of:     Ensure Plus  for at least 1 month (30 days) after your hospital stay   Tips for adding a nutrition shake into your routine: As allowed, drink one with vitamins or medications instead of water or juice Enjoy one as a tasty mid-morning or afternoon snack Drink cold or make a milkshake out of it Drink one instead of milk with cereal or snacks Use as a coffee creamer   Available at the following grocery stores and pharmacies:           * [place] [phone]            For COUPONS visit: www.ensure.com/join or http://dawson-may.com/   Suggested Substitutions Ensure Plus = Boost Plus = Carnation Breakfast Essentials Ensure Active Clear = Boost Breeze Glucerna Shake = Boost Glucose Control = Carnation Breakfast Essentials SUGAR FREE

## 2013-07-11 NOTE — Progress Notes (Addendum)
[facility] PROGRESS NOTE  PAILYNN VAHEY QPY:[id number] DOB: [birth year] DOA: 07/08/2013 PCP: Delphina Cahill, MD  Summary:  This patient was admitted to the hospital with abdominal pain, nausea and vomiting. She was found to have colitis on CT abdomen.  She was started on empiric antibiotics and GI was consulted.  Patient does have a history of chronic microscopic colitis and has been taking imodium and budesonide as an outpatient. In any case, stool C diff and GI pathogen panel were negative. CT angio of abdomen did not indicate any large vessel disease.  It is felt that colitis still may be infectious vs. Ischemic from small vessel disease.  Diet has been advanced and patient is tolerating this.  Abdominal pain and nausea have resolved.  She has started to have some loose stools.  Plans will be to complete a course of oral antibiotics and likely discharge home 7/8.  Assessment/Plan: 1. Colitis, possibly infectious. The patient does have history of chronic microscopic colitis. Currently she is on ciprofloxacin and Flagyl. Clostridium difficile and GI pathogen panel are negative. Appreciate gastroenterology input. CT angiogram of the abdomen did not indicate any large vessel disease. She'll be started on Imodium. Etiology could still be infectious versus small vessel ischemia. Plans are to continue a course of antibiotics. She's been resumed on Imodium. She may resume budesonide after discharge. 2. Nausea and vomiting. It appears to be improving. Advanced to solid diet and appears to be tolerating this. 3. Left-sided hydronephrosis. Incidental finding on CT. ER physician discussed case with urology, Dr. Junious Silk. It was not felt that any acute intervention was required at this time. Unless patient develops renal failure, plan is for observation. She will likely need to followup with urology as an outpatient 4. Seizures secondary to recent subarachnoid hemorrhage. Continue Keppra 5. Hypokalemia.  Replace 6. COPD. Appears stable. Continue Symbicort  Code Status: Partial code, no cpr in the event of cardiopulmonary arrest Family Communication: discussed with patient Disposition Plan: discharge home once improved, likely an a.m.   Consultants:  Gastroenterology  Procedures:    Antibiotics:  Flagyl 7/4>>  cipro 7/4>>  HPI/Subjective: Feeling better. Having loose stools. Abdominal pain has resolved. No nausea or vomiting  Objective: Filed Vitals:   07/11/13 1321  BP: 95/63  Pulse: 93  Temp: 99 F (37.2 C)  Resp: 20    Intake/Output Summary (Last 24 hours) at 07/11/13 1714 Last data filed at 07/11/13 1321  Gross per 24 hour  Intake 1898.33 ml  Output    800 ml  Net 1098.33 ml   Filed Weights   07/08/13 1151 07/08/13 1759 07/11/13 0442  Weight: 47.628 kg (105 lb) 47.628 kg (105 lb) 49.034 kg (108 lb 1.6 oz)    Exam:   General:  NAD  Cardiovascular: S1, S2 RRR  Respiratory:  CTA B  Abdomen: soft, nontender, bs+  Musculoskeletal: no edema b/l   Data Reviewed: Basic Metabolic Panel:  Recent Labs Lab 07/08/13 1345 07/09/13 0631 07/10/13 0608  NA 141 141 143  K 4.0 3.2* 3.8  CL 101 104 109  CO2 29 28 26   GLUCOSE 104* 114* 98  BUN 9 6 5*  CREATININE 0.71 0.70 0.71  CALCIUM 9.6 8.7 8.5   Liver Function Tests:  Recent Labs Lab 07/08/13 1345 07/11/13 0545  AST 21 12  ALT 16 10  ALKPHOS 46 38*  BILITOT 0.6 0.3  PROT 6.9 4.8*  ALBUMIN 4.1 2.7*    Recent Labs Lab 07/08/13 1345  LIPASE 29  No results found for this basename: AMMONIA,  in the last 168 hours CBC:  Recent Labs Lab 07/08/13 1345 07/09/13 0631  WBC 8.7 7.9  NEUTROABS 7.7  --   HGB 14.5 12.3  HCT 43.3 37.0  MCV 97.1 97.9  PLT 235 193   Cardiac Enzymes: No results found for this basename: CKTOTAL, CKMB, CKMBINDEX, TROPONINI,  in the last 168 hours BNP (last 3 results) No results found for this basename: PROBNP,  in the last 8760 hours CBG: No results  found for this basename: GLUCAP,  in the last 168 hours  Recent Results (from the past 240 hour(s))  CLOSTRIDIUM DIFFICILE BY PCR     Status: None   Collection Time    07/09/13  2:07 PM      Result Value Ref Range Status   C difficile by pcr NEGATIVE  NEGATIVE Final     Studies: Ct Angio Abdomen W/cm &/or Wo Contrast  07/10/2013   CLINICAL DATA:  Ischemic colitis.  EXAM: CT ANGIOGRAPHY ABDOMEN  TECHNIQUE: Multidetector CT imaging of the abdomen was performed using the standard protocol during bolus administration of intravenous contrast. Multiplanar reconstructed images including MIPs were obtained and reviewed to evaluate the vascular anatomy.  CONTRAST:  126mL OMNIPAQUE IOHEXOL 350 MG/ML SOLN  COMPARISON:  CT abdomen and pelvis 07/08/2013  FINDINGS: A small right pleural effusion is present. Mild dependent atelectasis is evident bilaterally. Multiple hepatic cysts are again noted. No focal parenchymal lesions are present. The spleen is within normal limits. The stomach and duodenum are within normal limits. Calcifications are again noted within the head of the pancreas and may be within the pancreatic duct. The common bile duct is dilated, measuring 10 mm. No obstructing lesion is present. Multiple layering stones are present within the gallbladder. There is no focal inflammation. Previously-seen dilation of the left renal collecting system is resolved. The kidneys are unremarkable.  Dense atherosclerotic calcifications are present in the abdominal aorta. Arterial phase imaging demonstrates atherosclerotic calcifications at the origin of the mesenteric vessels without focal stenosis. The superior mesenteric artery and celiac artery are intact. No definite stenosis is present at the inferior mesenteric artery. The proximal iliac arteries are within normal limits. Bone windows demonstrate a vacuum disc at multiple levels. Rightward curvature of the thoracolumbar spine is evident.  Review of the MIP images  confirms the above findings.  IMPRESSION: 1. Atherosclerotic changes without focal stenosis of the mesenteric arteries or renal arteries. 2. Improved appearance of the colon suggesting resolving colitis. Inflammatory changes are still seen in the mesentery. 3. Chololithiasis without evidence for cholecystitis. 4. Resolution of left sided hydronephrosis.   Electronically Signed   By: Lawrence Santiago M.D.   On: 07/10/2013 15:33    Scheduled Meds: . budesonide-formoterol  2 puff Inhalation BID  . ciprofloxacin  500 mg Oral BID  . citalopram  10 mg Oral Daily  . feeding supplement (ENSURE COMPLETE)  237 mL Oral BID BM  . feeding supplement (PRO-STAT SUGAR FREE 64)  30 mL Oral BID BM  . heparin  5,000 Units Subcutaneous 3 times per day  . levETIRAcetam  250 mg Oral BID  . loperamide  2 mg Oral BID  . metroNIDAZOLE  250 mg Oral 3 times per day  . saccharomyces boulardii  250 mg Oral BID  . thiamine  100 mg Oral Daily   Continuous Infusions: . sodium chloride 50 mL/hr at 07/10/13 1631    Active Problems:   Seizures secondary to [facility]  Microscopic colitis   Colitis   Nausea and vomiting   Abdominal pain, unspecified site   Hydronephrosis, left    Time spent: 9mins    Aren Cherne  [facility] Pager [phone]. If 7PM-7AM, please contact night-coverage at [email or web address], password [facility] 07/11/2013, 5:14 PM  LOS: 3 days

## 2013-07-12 DIAGNOSIS — R109 Unspecified abdominal pain: Secondary | ICD-10-CM

## 2013-07-12 MED ORDER — METRONIDAZOLE 250 MG PO TABS: 250.0000 mg | ORAL_TABLET | Freq: Three times a day (TID) | ORAL | Status: DC

## 2013-07-12 MED ORDER — CIPROFLOXACIN HCL 500 MG PO TABS: 500.0000 mg | ORAL_TABLET | Freq: Two times a day (BID) | ORAL | Status: DC

## 2013-07-12 MED ORDER — BUDESONIDE 3 MG PO CP24: 3.0000 mg | ORAL_CAPSULE | Freq: Two times a day (BID) | ORAL | Status: DC

## 2013-07-12 NOTE — Progress Notes (Signed)
07/12/13 1151 Reviewed discharge instructions with patient, daughter at bedside. Given copy of AVS, f/u appointment information. Prescriptions called into patient's pharmacy per MD. Noted when home medications next due on AVS medication list. Pt and daughter verbalized understanding of instructions. No c/o pain or discomfort at discharge. IV sites d/c'd per nurse tech, within normal limits. Pt left floor in stable condition via w/c accompanied by nursing staff. Donavan Foil, RN

## 2013-07-12 NOTE — Progress Notes (Signed)
Patient ID: Bridget Ball, female   DOB: 11/08/1928, 80 y.o.   MRN: [id number] She states she feels better. Ate all of diet last night.  Stools are loose. She says her stools are watery. She had 2 loose stools yesterday. One loose stool this am. GI pathogen negative. No nausea or vomiting.  Filed Vitals:   07/11/13 1321 07/11/13 1926 07/11/13 2025 07/12/13 0538  BP: 95/63  111/62 103/61  Pulse: 93  76 76  Temp: 99 F (37.2 C)  98.4 F (36.9 C) 97.9 F (36.6 C)  TempSrc: Oral  Oral Oral  Resp: 20   20  Height:      Weight:      SpO2: 95% 93% 95% 91%  I/O last 3 completed shifts: In: 1898.3 [P.O.:720; I.V.:1178.3] Out: 1100 [Urine:1100]   Will need a f/u office visit 3 weeks.

## 2013-07-12 NOTE — Progress Notes (Signed)
UR chart review completed.  

## 2013-07-12 NOTE — Discharge Summary (Addendum)
Physician Discharge Summary  Bridget Ball MPN:[id number] DOB: April 15, 1928 DOA: 07/08/2013  PCP: Delphina Cahill, MD  Admit date: 07/08/2013 Discharge date: 07/12/2013  Time spent: 35 minutes  Recommendations for Outpatient Follow-up:  1. Complete Cipro Flagyl and 07/14/13 2. Continue oral budesonide 2 mg twice a day for another 20 days until seen by gastroenterology as an outpatient-this will help to be tapered as per instructions per GI 3. May need referral to [facility] for consideration of ERCP based on recommendations from Dr. Laural Golden 4. Recommend CBC Bmet in about 1 week  Discharge Diagnoses:  Active Problems:   Seizures secondary to [facility]   Microscopic colitis   Colitis   Nausea and vomiting   Abdominal pain, unspecified site   Hydronephrosis, left   Discharge Condition: fair  Diet recommendation: regular  Filed Weights   07/08/13 1151 07/08/13 1759 07/11/13 0442  Weight: 47.628 kg (105 lb) 47.628 kg (105 lb) 49.034 kg (108 lb 1.6 oz)    History of present illness:   80 y/o ?, known history microscopic colitis on budesonide Imodium admitted 07/08/13 with abdominal pain. She has chronic diarrhea. Per to this admission she had 5-8 stools per day previously and had colonoscopy 05/24/2013-EGD showed erosive reflux esophagitis with hiatal hernia, colonoscopy showed incomplete exam limited flexible sigmoidoscopy and she was found on this pathology to have microscopic colitis and prescribed budesonide tapering doses. She was admitted to the hospital and in view of her symptoms was on IV Cipro and Flagyl. C. difficile PCR and GI pathogen panel was negative. It was felt prudent to exclude ischemic colitis and CT angiogram abdomen was performed as well which was negative for ischemia Gastroenterology subsequently saw the patient and patient made clinical improvement on steroids as well as scheduled Imodium and patient was encouraged followup with gastroenterology as an outpatient Seizures  from her recent subarachnoid hemorrhage was stable during hospital stay as was her COPD She was found to have left-sided hydronephrosis which was an incidental finding and not felt to warrant further attention as per urologist who was consulted by EDP   Procedures: CT angiogram abdomen pelvis    Consultations:  Gastroenterology  Discharge Exam: Filed Vitals:   07/12/13 0538  BP: 103/61  Pulse: 76  Temp: 97.9 F (36.6 C)  Resp: 20    General: Alert pleasant oriented no apparent distress  Cardiovascular: S1-S2 no murmur rub or gallop  Respiratory: Clinically clear  Discharge Instructions You were cared for by a hospitalist during your hospital stay. If you have any questions about your discharge medications or the care you received while you were in the hospital after you are discharged, you can call the unit and asked to speak with the hospitalist on call if the hospitalist that took care of you is not available. Once you are discharged, your primary care physician will handle any further medical issues. Please note that NO REFILLS for any discharge medications will be authorized once you are discharged, as it is imperative that you return to your primary care physician (or establish a relationship with a primary care physician if you do not have one) for your aftercare needs so that they can reassess your need for medications and monitor your lab values.  Discharge Instructions   Diet - low sodium heart healthy    Complete by:  As directed      Discharge instructions    Complete by:  As directed   Continue both Cipro and Flagyl till 07/14/13-prescriptions have been sent  to Pharmacy. Budesonide should be continued at current dose until seen by Gastroenterology. You will probably need a ERCP at [facility] and I would encourage you to discuss this with Dr. Olevia Perches office on discharge You have     Increase activity slowly    Complete by:  As directed              Medication List         budesonide 3 MG 24 hr capsule  Commonly known as:  ENTOCORT EC  Take 1 capsule (3 mg total) by mouth 2 (two) times daily.     budesonide-formoterol 160-4.5 MCG/ACT inhaler  Commonly known as:  SYMBICORT  Inhale 2 puffs into the lungs 2 (two) times daily.     ciprofloxacin 500 MG tablet  Commonly known as:  CIPRO  Take 1 tablet (500 mg total) by mouth 2 (two) times daily.     citalopram 10 MG tablet  Commonly known as:  CELEXA  Take 1 tablet (10 mg total) by mouth daily.     levETIRAcetam 250 MG tablet  Commonly known as:  KEPPRA  Take 1 tablet (250 mg total) by mouth 2 (two) times daily.     loperamide 2 MG tablet  Commonly known as:  IMODIUM A-D  Take 1 tablet (2 mg total) by mouth 2 (two) times daily before a meal.     Melatonin 5 MG Tabs  Take 5 mg by mouth at bedtime.     metroNIDAZOLE 250 MG tablet  Commonly known as:  FLAGYL  Take 1 tablet (250 mg total) by mouth every 8 (eight) hours.     pantoprazole 40 MG tablet  Commonly known as:  PROTONIX  Take 1 tablet (40 mg total) by mouth daily before breakfast.     saccharomyces boulardii 250 MG capsule  Commonly known as:  FLORASTOR  Take 250 mg by mouth 2 (two) times daily.     thiamine 100 MG tablet  Take 1 tablet (100 mg total) by mouth daily.       Allergies  Allergen Reactions  . Penicillins Anaphylaxis      The results of significant diagnostics from this hospitalization (including imaging, microbiology, ancillary and laboratory) are listed below for reference.    Significant Diagnostic Studies: Dg Chest 2 View  07/08/2013   CLINICAL DATA:  Chest abdominal pain.  Weakness.  EXAM: CHEST  2 VIEW  COMPARISON:  None.  FINDINGS: The heart is normal in size. There is tortuosity, ectasia and calcification of the thoracic aorta. The pulmonary hila appear normal. The lungs demonstrate emphysematous changes. No acute pulmonary findings. No pleural effusion. The bony thorax is intact.   IMPRESSION: Emphysematous changes but no acute pulmonary findings.   Electronically Signed   By: Kalman Jewels M.D.   On: 07/08/2013 15:13   Ct Angio Abdomen W/cm &/or Wo Contrast  07/10/2013   CLINICAL DATA:  Ischemic colitis.  EXAM: CT ANGIOGRAPHY ABDOMEN  TECHNIQUE: Multidetector CT imaging of the abdomen was performed using the standard protocol during bolus administration of intravenous contrast. Multiplanar reconstructed images including MIPs were obtained and reviewed to evaluate the vascular anatomy.  CONTRAST:  135mL OMNIPAQUE IOHEXOL 350 MG/ML SOLN  COMPARISON:  CT abdomen and pelvis 07/08/2013  FINDINGS: A small right pleural effusion is present. Mild dependent atelectasis is evident bilaterally. Multiple hepatic cysts are again noted. No focal parenchymal lesions are present. The spleen is within normal limits. The stomach and duodenum are within normal limits. Calcifications  are again noted within the head of the pancreas and may be within the pancreatic duct. The common bile duct is dilated, measuring 10 mm. No obstructing lesion is present. Multiple layering stones are present within the gallbladder. There is no focal inflammation. Previously-seen dilation of the left renal collecting system is resolved. The kidneys are unremarkable.  Dense atherosclerotic calcifications are present in the abdominal aorta. Arterial phase imaging demonstrates atherosclerotic calcifications at the origin of the mesenteric vessels without focal stenosis. The superior mesenteric artery and celiac artery are intact. No definite stenosis is present at the inferior mesenteric artery. The proximal iliac arteries are within normal limits. Bone windows demonstrate a vacuum disc at multiple levels. Rightward curvature of the thoracolumbar spine is evident.  Review of the MIP images confirms the above findings.  IMPRESSION: 1. Atherosclerotic changes without focal stenosis of the mesenteric arteries or renal arteries. 2.  Improved appearance of the colon suggesting resolving colitis. Inflammatory changes are still seen in the mesentery. 3. Chololithiasis without evidence for cholecystitis. 4. Resolution of left sided hydronephrosis.   Electronically Signed   By: Lawrence Santiago M.D.   On: 07/10/2013 15:33   Ct Abdomen Pelvis W Contrast  07/08/2013   CLINICAL DATA:  Mid abdominal pain radiating into the back. Nausea and vomiting. Diarrhea.  EXAM: CT ABDOMEN AND PELVIS WITH CONTRAST  TECHNIQUE: Multidetector CT imaging of the abdomen and pelvis was performed using the standard protocol following bolus administration of intravenous contrast.  CONTRAST:  128mL OMNIPAQUE IOHEXOL 300 MG/ML IV. Oral contrast was also administered.  COMPARISON:  None.  FINDINGS: Wall thickening involving the cecum, ascending colon and proximal and mid transverse colon, associated with hyperemia. No abnormal fluid collection to suggest abscess. Interposition of the hepatic flexure of the colon between the liver and the right hemidiaphragm. No ascites. Large stool burden throughout the remainder of the colon. Scattered sigmoid colon diverticula without evidence of acute diverticulitis. Normal appearing stomach and small bowel.  Multiple small low-attenuation lesions throughout the liver, the largest approximating 8 mm in the medial segment left lobe. The largest lesions represent simple cyst, and I suspect that the smaller ones are cysts as well, given their conspicuity for their small size. Normal spleen. Borderline pancreatic ductal dilation, as there is a stone within the pancreatic duct in the head of the pancreas. A calcification is also present within the pancreatic head parenchyma. Numerous gallstones are present within the gallbladder, including several in the gallbladder neck. Extrahepatic bile duct upper normal in caliber approximating 8 mm. High attenuation material in the distal common bile duct at the ampulla, likely sludge. No CT evidence for  acute cholecystitis.  Severe left hydronephrosis without associated ureteral dilation. Delayed excretion of contrast by the left kidney. Numerous small cortical cysts involving the left kidney. Normal-appearing right kidney. Extensive aortoiliofemoral atherosclerosis without aneurysm. No significant lymphadenopathy.  Urinary bladder unremarkable. Uterus atrophic consistent with age. No adnexal masses or free pelvic fluid. Numerous pelvic phleboliths.  Bone window images demonstrate osseous demineralization, degenerative changes throughout the lumbar spine, degenerative changes involving the sacroiliac joints. Visualized lung bases clear apart from minimal scarring in the lower lobes. Emphysematous changes are present.  IMPRESSION: 1. Colitis involving the cecum, ascending colon, and proximal and mid transverse colon. No evidence of abscess. 2. Severe left hydronephrosis without associated ureteral dilation. Mild obstruction of the left kidney with delayed contrast excretion. 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 3. Cholelithiasis without CT evidence  of acute cholecystitis. 4. Borderline extrahepatic biliary ductal dilation with probable sludge in the distal common bile duct. 5. Chronic calcific pancreatitis involving the pancreatic head, including a stone in the pancreatic duct. 6. Sigmoid colon diverticulosis without evidence of acute diverticulitis. 7. Numerous hepatic lesions felt to represent simple cysts, given their conspicuity for their small size. The largest lesions are simple cysts. 8. Emphysematous changes in the lower lobes.   Electronically Signed   By: Evangeline Dakin M.D.   On: 07/08/2013 15:19    Microbiology: Recent Results (from the past 240 hour(s))  CLOSTRIDIUM DIFFICILE BY PCR     Status: None   Collection Time    07/09/13  2:07 PM      Result Value Ref Range Status   C difficile by pcr NEGATIVE  NEGATIVE Final     Labs: Basic Metabolic  Panel:  Recent Labs Lab 07/08/13 1345 07/09/13 0631 07/10/13 0608  NA 141 141 143  K 4.0 3.2* 3.8  CL 101 104 109  CO2 29 28 26   GLUCOSE 104* 114* 98  BUN 9 6 5*  CREATININE 0.71 0.70 0.71  CALCIUM 9.6 8.7 8.5   Liver Function Tests:  Recent Labs Lab 07/08/13 1345 07/11/13 0545  AST 21 12  ALT 16 10  ALKPHOS 46 38*  BILITOT 0.6 0.3  PROT 6.9 4.8*  ALBUMIN 4.1 2.7*    Recent Labs Lab 07/08/13 1345  LIPASE 29   No results found for this basename: AMMONIA,  in the last 168 hours CBC:  Recent Labs Lab 07/08/13 1345 07/09/13 0631  WBC 8.7 7.9  NEUTROABS 7.7  --   HGB 14.5 12.3  HCT 43.3 37.0  MCV 97.1 97.9  PLT 235 193   Cardiac Enzymes: No results found for this basename: CKTOTAL, CKMB, CKMBINDEX, TROPONINI,  in the last 168 hours BNP: BNP (last 3 results) No results found for this basename: PROBNP,  in the last 8760 hours CBG: No results found for this basename: GLUCAP,  in the last 168 hours     Signed:  Nita Sells  [facility] 07/12/2013, 9:32 AM

## 2013-07-12 NOTE — Care Management Note (Signed)
    Page 1 of 1   07/12/2013     3:13:53 PM CARE MANAGEMENT NOTE 07/12/2013  Patient:  Bridget Ball, Bridget Ball   Account Number:  [id number]  Date Initiated:  07/12/2013  Documentation initiated by:  Vladimir Creeks  Subjective/Objective Assessment:   Admitted with colitis and a UTI. Pt is from home. Daughter lived with her. She lives alone, usually, but daughter staying whild looking for a house of her own. Pt is independent and will return home at D/C     Action/Plan:   Pt denies any needs.   Anticipated DC Date:  07/12/2013   Anticipated DC Plan:  [place]  CM consult      [facility] Choice  HOME HEALTH   Choice offered to / List presented to:             Status of service:  Completed, signed off Medicare Important Message given?  YES (If response is "NO", the following Medicare IM given date fields will be blank) Date Medicare IM given:  07/12/2013 Medicare IM given by:  Vladimir Creeks Date Additional Medicare IM given:   Additional Medicare IM given by:    Discharge Disposition:  HOME/SELF CARE  Per UR Regulation:  Reviewed for med. necessity/level of care/duration of stay  If discussed at [place] of Stay Meetings, dates discussed:    Comments:  07/12/13 [place]

## 2013-07-21 ENCOUNTER — Emergency Department (HOSPITAL_COMMUNITY): Payer: Medicare Other

## 2013-07-21 ENCOUNTER — Emergency Department (HOSPITAL_COMMUNITY)
Admission: EM | Admit: 2013-07-21 | Discharge: 2013-07-21 | Disposition: A | Discharge status: Home / Self Care | Payer: Medicare Other | Attending: Emergency Medicine | Admitting: Emergency Medicine

## 2013-07-21 ENCOUNTER — Encounter (HOSPITAL_COMMUNITY): Payer: Self-pay | Admitting: Emergency Medicine

## 2013-07-21 DIAGNOSIS — Z8673 Personal history of transient ischemic attack (TIA), and cerebral infarction without residual deficits: Secondary | ICD-10-CM | POA: Insufficient documentation

## 2013-07-21 DIAGNOSIS — F329 Major depressive disorder, single episode, unspecified: Secondary | ICD-10-CM | POA: Insufficient documentation

## 2013-07-21 DIAGNOSIS — Z79899 Other long term (current) drug therapy: Secondary | ICD-10-CM | POA: Insufficient documentation

## 2013-07-21 DIAGNOSIS — Z9089 Acquired absence of other organs: Secondary | ICD-10-CM | POA: Insufficient documentation

## 2013-07-21 DIAGNOSIS — Z87891 Personal history of nicotine dependence: Secondary | ICD-10-CM | POA: Insufficient documentation

## 2013-07-21 DIAGNOSIS — Z85828 Personal history of other malignant neoplasm of skin: Secondary | ICD-10-CM | POA: Insufficient documentation

## 2013-07-21 DIAGNOSIS — F3289 Other specified depressive episodes: Secondary | ICD-10-CM | POA: Insufficient documentation

## 2013-07-21 DIAGNOSIS — Z9851 Tubal ligation status: Secondary | ICD-10-CM | POA: Insufficient documentation

## 2013-07-21 DIAGNOSIS — Z8719 Personal history of other diseases of the digestive system: Secondary | ICD-10-CM | POA: Insufficient documentation

## 2013-07-21 DIAGNOSIS — Z792 Long term (current) use of antibiotics: Secondary | ICD-10-CM | POA: Insufficient documentation

## 2013-07-21 DIAGNOSIS — G40909 Epilepsy, unspecified, not intractable, without status epilepticus: Secondary | ICD-10-CM | POA: Insufficient documentation

## 2013-07-21 DIAGNOSIS — J449 Chronic obstructive pulmonary disease, unspecified: Secondary | ICD-10-CM | POA: Insufficient documentation

## 2013-07-21 DIAGNOSIS — Z8739 Personal history of other diseases of the musculoskeletal system and connective tissue: Secondary | ICD-10-CM | POA: Insufficient documentation

## 2013-07-21 DIAGNOSIS — J4489 Other specified chronic obstructive pulmonary disease: Secondary | ICD-10-CM | POA: Insufficient documentation

## 2013-07-21 DIAGNOSIS — R112 Nausea with vomiting, unspecified: Secondary | ICD-10-CM | POA: Insufficient documentation

## 2013-07-21 DIAGNOSIS — R1013 Epigastric pain: Secondary | ICD-10-CM

## 2013-07-21 DIAGNOSIS — Z880 Allergy status to penicillin: Secondary | ICD-10-CM | POA: Insufficient documentation

## 2013-07-21 LAB — CBC WITH DIFFERENTIAL/PLATELET
Basophils Absolute: 0 10*3/uL (ref 0.0–0.1)
Basophils Relative: 0 % (ref 0–1)
EOS ABS: 0.1 10*3/uL (ref 0.0–0.7)
EOS PCT: 1 % (ref 0–5)
HEMATOCRIT: 39.3 % (ref 36.0–46.0)
Hemoglobin: 13.1 g/dL (ref 12.0–15.0)
Lymphocytes Relative: 5 % — ABNORMAL LOW (ref 12–46)
Lymphs Abs: 0.5 10*3/uL — ABNORMAL LOW (ref 0.7–4.0)
MCH: 32.1 pg (ref 26.0–34.0)
MCHC: 33.3 g/dL (ref 30.0–36.0)
MCV: 96.3 fL (ref 78.0–100.0)
MONOS PCT: 8 % (ref 3–12)
Monocytes Absolute: 0.8 10*3/uL (ref 0.1–1.0)
Neutro Abs: 9.1 10*3/uL — ABNORMAL HIGH (ref 1.7–7.7)
Neutrophils Relative %: 86 % — ABNORMAL HIGH (ref 43–77)
PLATELETS: 265 10*3/uL (ref 150–400)
RBC: 4.08 MIL/uL (ref 3.87–5.11)
RDW: 15 % (ref 11.5–15.5)
WBC: 10.5 10*3/uL (ref 4.0–10.5)

## 2013-07-21 LAB — URINALYSIS, ROUTINE W REFLEX MICROSCOPIC
Bilirubin Urine: NEGATIVE
Glucose, UA: NEGATIVE mg/dL
HGB URINE DIPSTICK: NEGATIVE
Ketones, ur: 15 mg/dL — AB
Leukocytes, UA: NEGATIVE
Nitrite: NEGATIVE
PROTEIN: NEGATIVE mg/dL
SPECIFIC GRAVITY, URINE: 1.01 (ref 1.005–1.030)
UROBILINOGEN UA: 0.2 mg/dL (ref 0.0–1.0)
pH: 7 (ref 5.0–8.0)

## 2013-07-21 LAB — COMPREHENSIVE METABOLIC PANEL
ALT: 16 U/L (ref 0–35)
AST: 16 U/L (ref 0–37)
Albumin: 3.8 g/dL (ref 3.5–5.2)
Alkaline Phosphatase: 43 U/L (ref 39–117)
Anion gap: 12 (ref 5–15)
BUN: 14 mg/dL (ref 6–23)
CALCIUM: 8.9 mg/dL (ref 8.4–10.5)
CO2: 26 meq/L (ref 19–32)
CREATININE: 0.82 mg/dL (ref 0.50–1.10)
Chloride: 99 mEq/L (ref 96–112)
GFR, EST AFRICAN AMERICAN: 75 mL/min — AB (ref >90)
GFR, EST NON AFRICAN AMERICAN: 65 mL/min — AB (ref >90)
GLUCOSE: 104 mg/dL — AB (ref 70–99)
Potassium: 3.6 mEq/L — ABNORMAL LOW (ref 3.7–5.3)
SODIUM: 137 meq/L (ref 137–147)
TOTAL PROTEIN: 6.2 g/dL (ref 6.0–8.3)
Total Bilirubin: 0.6 mg/dL (ref 0.3–1.2)

## 2013-07-21 MED ORDER — ONDANSETRON HCL 4 MG/2ML IJ SOLN
4.0000 mg | Freq: Once | INTRAMUSCULAR | Status: AC
  Administered 2013-07-21: 4 mg via INTRAVENOUS
  Filled 2013-07-21: qty 2

## 2013-07-21 MED ORDER — HYDROMORPHONE HCL PF 1 MG/ML IJ SOLN
INTRAMUSCULAR | Status: AC
  Filled 2013-07-21: qty 1

## 2013-07-21 MED ORDER — ONDANSETRON HCL 4 MG/2ML IJ SOLN
4.0000 mg | Freq: Once | INTRAMUSCULAR | Status: AC
Start: 2013-07-21 — End: 2013-07-21
  Administered 2013-07-21: 4 mg via INTRAVENOUS
  Filled 2013-07-21: qty 2

## 2013-07-21 MED ORDER — HYDROMORPHONE HCL PF 1 MG/ML IJ SOLN
1.0000 mg | Freq: Once | INTRAMUSCULAR | Status: AC
  Administered 2013-07-21: 1 mg via INTRAVENOUS

## 2013-07-21 MED ORDER — DOCUSATE SODIUM 100 MG PO CAPS: ORAL_CAPSULE | ORAL | Status: DC

## 2013-07-21 MED ORDER — IOHEXOL 300 MG/ML  SOLN
50.0000 mL | Freq: Once | INTRAMUSCULAR | Status: AC | PRN
  Administered 2013-07-21: 50 mL via ORAL

## 2013-07-21 MED ORDER — HYDROCODONE-ACETAMINOPHEN 5-325 MG PO TABS: ORAL_TABLET | ORAL | Status: DC

## 2013-07-21 MED ORDER — LORAZEPAM 2 MG/ML IJ SOLN
0.5000 mg | Freq: Once | INTRAMUSCULAR | Status: AC
  Administered 2013-07-21: 0.5 mg via INTRAVENOUS
  Filled 2013-07-21: qty 1

## 2013-07-21 MED ORDER — HYDROMORPHONE HCL PF 1 MG/ML IJ SOLN
1.0000 mg | Freq: Once | INTRAMUSCULAR | Status: AC
  Administered 2013-07-21: 1 mg via INTRAVENOUS
  Filled 2013-07-21: qty 1

## 2013-07-21 MED ORDER — IOHEXOL 300 MG/ML  SOLN
100.0000 mL | Freq: Once | INTRAMUSCULAR | Status: AC | PRN
  Administered 2013-07-21: 100 mL via INTRAVENOUS

## 2013-07-21 MED ORDER — SODIUM CHLORIDE 0.9 % IV BOLUS (SEPSIS)
500.0000 mL | Freq: Once | INTRAVENOUS | Status: AC
  Administered 2013-07-21: 500 mL via INTRAVENOUS

## 2013-07-21 NOTE — Discharge Instructions (Signed)
Follow up with your family md next week.  Follow up with [facility] in [place] or [place] next week.  Increase the protonix to twice a day.  Return if problems

## 2013-07-21 NOTE — ED Notes (Signed)
Pt states she started having abdominal pain this morning, n/v denies diarrhea.

## 2013-07-21 NOTE — ED Provider Notes (Signed)
CSN: [id number]     Arrival date & time 07/21/13  1637 History   First MD Initiated Contact with Patient 07/21/13 1659   This chart was scribed for Bridget Diego, MD by Rosary Lively, ED scribe. This patient was seen in room APA17/APA17 and the patient's care was started at 5:16 PM.    Chief Complaint  Patient presents with  . Abdominal Pain   Patient is a 80 y.o. female presenting with abdominal pain. The history is provided by the patient and a relative (the pt complains of back and abd pain). No language interpreter was used.  Abdominal Pain Pain location:  Generalized Pain quality: aching   Pain radiates to:  Does not radiate Pain severity:  Moderate Onset quality:  Gradual Timing:  Constant Progression:  Worsening Associated symptoms: nausea and vomiting   Associated symptoms: no chest pain, no cough, no diarrhea, no fatigue and no hematuria    HPI Comments:  Bridget Ball is a 80 y.o. female with a h/o colitis who presents to the Emergency Department complaining of abdominal pain, with associated symptoms of vomiting. Relative reports that pt feels as if she is constipated, and was advised to try an enema. Enema was tried without relief.   Past Medical History  Diagnosis Date  . COPD (chronic obstructive pulmonary disease)   . Arthritis   . Stroke     TIA four years ago  . Depression   . Cancer     Skin cancer  . Seizures     x 2 last year due to bleeding to the brain.  . Memory difficulties   . Chronic diarrhea   . Hiatal hernia    Past Surgical History  Procedure Laterality Date  . Appendectomy    . Skin cancer excision    . Tubal ligation    . Colonoscopy N/A 05/24/2013    Procedure: COLONOSCOPY;  Surgeon: Rogene Houston, MD;  [facility];  Service: Endoscopy;  Laterality: N/A;  100  . Esophagogastroduodenoscopy N/A 05/24/2013    Procedure: ESOPHAGOGASTRODUODENOSCOPY (EGD);  Surgeon: Rogene Houston, MD;  Location[facility];  Service:  Endoscopy;  Laterality: N/A;   Family History  Problem Relation Age of Onset  . Diabetes Mother    History  Substance Use Topics  . Smoking status: Former Smoker -- 1.00 packs/day for 50 years    Quit date: 11/13/1997  . Smokeless tobacco: Not on file  . Alcohol Use: 4.2 oz/week    7 Glasses of wine per week     Comment: 2 glasses a night wine (none in a year)   OB History   Grav Para Term Preterm Abortions TAB SAB Ect Mult Living                 Review of Systems  Constitutional: Negative for appetite change and fatigue.  HENT: Negative for congestion, ear discharge and sinus pressure.   Eyes: Negative for discharge.  Respiratory: Negative for cough.   Cardiovascular: Negative for chest pain.  Gastrointestinal: Positive for nausea, vomiting and abdominal pain. Negative for diarrhea.  Genitourinary: Negative for frequency and hematuria.  Musculoskeletal: Negative for back pain.  Skin: Negative for rash.  Neurological: Negative for seizures and headaches.  Psychiatric/Behavioral: Negative for hallucinations.      Allergies  Penicillins  Home Medications   Prior to Admission medications   Medication Sig Start Date End Date Taking? Authorizing Provider  budesonide (ENTOCORT EC) 3 MG 24 hr capsule  Take 1 capsule (3 mg total) by mouth 2 (two) times daily. 07/12/13   Nita Sells, MD  budesonide-formoterol (SYMBICORT) 160-4.5 MCG/ACT inhaler Inhale 2 puffs into the lungs 2 (two) times daily.    Historical Provider, MD  ciprofloxacin (CIPRO) 500 MG tablet Take 1 tablet (500 mg total) by mouth 2 (two) times daily. 07/12/13   Nita Sells, MD  citalopram (CELEXA) 10 MG tablet Take 1 tablet (10 mg total) by mouth daily. 01/24/13   Antony Contras, MD  levETIRAcetam (KEPPRA) 250 MG tablet Take 1 tablet (250 mg total) by mouth 2 (two) times daily. 04/25/13   Antony Contras, MD  loperamide (IMODIUM A-D) 2 MG tablet Take 1 tablet (2 mg total) by mouth 2 (two) times daily before a  meal. 05/24/13   Rogene Houston, MD  LORazepam (ATIVAN) 0.5 MG tablet  06/06/13   Historical Provider, MD  Melatonin 5 MG TABS Take 5 mg by mouth at bedtime.    Historical Provider, MD  metroNIDAZOLE (FLAGYL) 250 MG tablet Take 1 tablet (250 mg total) by mouth every 8 (eight) hours. 07/12/13   Nita Sells, MD  pantoprazole (PROTONIX) 40 MG tablet Take 1 tablet (40 mg total) by mouth daily before breakfast. 05/24/13   Rogene Houston, MD  saccharomyces boulardii (FLORASTOR) 250 MG capsule Take 250 mg by mouth 2 (two) times daily.    Historical Provider, MD  thiamine 100 MG tablet Take 1 tablet (100 mg total) by mouth daily. 11/16/12   Maricela Curet, MD   BP 139/75  Pulse 83  Temp(Src) 98.4 F (36.9 C) (Oral)  Resp 16  SpO2 95% Physical Exam  Constitutional: She is oriented to person, place, and time.  Cachectic  HENT:  Head: Normocephalic.  Mouth/Throat: Mucous membranes are dry.  Eyes: Conjunctivae and EOM are normal. No scleral icterus.  Neck: Neck supple. No thyromegaly present.  Cardiovascular: Normal rate and regular rhythm.  Exam reveals no gallop and no friction rub.   No murmur heard. Pulmonary/Chest: No stridor. She has no wheezes. She has no rales. She exhibits no tenderness.  Abdominal: She exhibits no distension. There is tenderness (Moderate abdominal tenderness). There is no rebound.  Musculoskeletal: Normal range of motion. She exhibits no edema.  Lymphadenopathy:    She has no cervical adenopathy.  Neurological: She is oriented to person, place, and time. She exhibits normal muscle tone. Coordination normal.  Skin: No rash noted. No erythema.  Psychiatric: She has a normal mood and affect. Her behavior is normal.    ED Course  Procedures  DIAGNOSTIC STUDIES: Oxygen Saturation is 95% on RA, adequate by my interpretation.  COORDINATION OF CARE: 5:20 PM-Discussed treatment plan with pt at bedside and pt agreed to plan. Results for orders placed during the  hospital encounter of 07/21/13  CBC WITH DIFFERENTIAL      Result Value Ref Range   WBC 10.5  4.0 - 10.5 K/uL   RBC 4.08  3.87 - 5.11 MIL/uL   Hemoglobin 13.1  12.0 - 15.0 g/dL   HCT 39.3  36.0 - 46.0 %   MCV 96.3  78.0 - 100.0 fL   MCH 32.1  26.0 - 34.0 pg   MCHC 33.3  30.0 - 36.0 g/dL   RDW 15.0  11.5 - 15.5 %   Platelets 265  150 - 400 K/uL   Neutrophils Relative % 86 (*) 43 - 77 %   Neutro Abs 9.1 (*) 1.7 - 7.7 K/uL   Lymphocytes Relative 5 (*)  12 - 46 %   Lymphs Abs 0.5 (*) 0.7 - 4.0 K/uL   Monocytes Relative 8  3 - 12 %   Monocytes Absolute 0.8  0.1 - 1.0 K/uL   Eosinophils Relative 1  0 - 5 %   Eosinophils Absolute 0.1  0.0 - 0.7 K/uL   Basophils Relative 0  0 - 1 %   Basophils Absolute 0.0  0.0 - 0.1 K/uL  COMPREHENSIVE METABOLIC PANEL      Result Value Ref Range   Sodium 137  137 - 147 mEq/L   Potassium 3.6 (*) 3.7 - 5.3 mEq/L   Chloride 99  96 - 112 mEq/L   CO2 26  19 - 32 mEq/L   Glucose, Bld 104 (*) 70 - 99 mg/dL   BUN 14  6 - 23 mg/dL   Creatinine, Ser 0.82  0.50 - 1.10 mg/dL   Calcium 8.9  8.4 - 10.5 mg/dL   Total Protein 6.2  6.0 - 8.3 g/dL   Albumin 3.8  3.5 - 5.2 g/dL   AST 16  0 - 37 U/L   ALT 16  0 - 35 U/L   Alkaline Phosphatase 43  39 - 117 U/L   Total Bilirubin 0.6  0.3 - 1.2 mg/dL   GFR calc non Af Amer 65 (*) >90 mL/min   GFR calc Af Amer 75 (*) >90 mL/min   Anion gap 12  5 - 15      EKG Interpretation None      Date: 07/21/2013  Rate: 81  Rhythm: normal sinus rhythm  QRS Axis: nl  Intervals:nl  ST/T Wave abnormalities: nonspecific st changes  Conduction Disutrbances nl  Narrative Interpretation:   Old EKG Reviewed: none    MDM   Final diagnoses:  None    Spoke to urology and it was felt the changes on ct were old and the pt can be follow up in the office.  The chart was scribed for me under my direct supervision.  I personally performed the history, physical, and medical decision making and all procedures in the evaluation of  this patient.Bridget Diego, MD 07/21/13 [phone]

## 2013-07-24 ENCOUNTER — Telehealth (INDEPENDENT_AMBULATORY_CARE_PROVIDER_SITE_OTHER): Payer: Self-pay | Admitting: *Deleted

## 2013-07-24 ENCOUNTER — Other Ambulatory Visit: Payer: Self-pay | Admitting: Neurology

## 2013-07-24 NOTE — Telephone Encounter (Addendum)
Mr Treacy, [place] son would like to talk to you about his mother, apparently she was in ED Friday for colitis, had CT, he was under impression she would be admitted for observation but was sent home -- while there she was given Dilaudid and was in comatose state all weekend and is now coming out of it -- please call him @ [phone]

## 2013-07-24 NOTE — Telephone Encounter (Signed)
Will bring her in tomorrow.

## 2013-07-25 ENCOUNTER — Ambulatory Visit (INDEPENDENT_AMBULATORY_CARE_PROVIDER_SITE_OTHER): Payer: Medicare Other | Admitting: Internal Medicine

## 2013-07-25 ENCOUNTER — Encounter (HOSPITAL_COMMUNITY): Payer: Self-pay | Admitting: Radiology

## 2013-07-25 ENCOUNTER — Encounter (INDEPENDENT_AMBULATORY_CARE_PROVIDER_SITE_OTHER): Payer: Self-pay | Admitting: Internal Medicine

## 2013-07-25 ENCOUNTER — Emergency Department (HOSPITAL_COMMUNITY): Payer: Medicare Other

## 2013-07-25 ENCOUNTER — Inpatient Hospital Stay (HOSPITAL_COMMUNITY)
Admission: EM | Admit: 2013-07-25 | Discharge: 2013-08-03 | DRG: 853 | Disposition: A | Discharge status: Skilled Nursing Facility | Payer: Medicare Other | Attending: Internal Medicine | Admitting: Internal Medicine

## 2013-07-25 ENCOUNTER — Encounter (HOSPITAL_COMMUNITY): Payer: Self-pay | Admitting: Emergency Medicine

## 2013-07-25 VITALS — BP 90/60 | HR 84 | Temp 97.1°F | Ht 61.0 in | Wt 102.0 lb

## 2013-07-25 DIAGNOSIS — I4510 Unspecified right bundle-branch block: Secondary | ICD-10-CM | POA: Diagnosis present

## 2013-07-25 DIAGNOSIS — B370 Candidal stomatitis: Secondary | ICD-10-CM

## 2013-07-25 DIAGNOSIS — I509 Heart failure, unspecified: Secondary | ICD-10-CM | POA: Diagnosis present

## 2013-07-25 DIAGNOSIS — A498 Other bacterial infections of unspecified site: Secondary | ICD-10-CM

## 2013-07-25 DIAGNOSIS — N170 Acute kidney failure with tubular necrosis: Secondary | ICD-10-CM | POA: Diagnosis not present

## 2013-07-25 DIAGNOSIS — R740 Nonspecific elevation of levels of transaminase and lactic acid dehydrogenase [LDH]: Secondary | ICD-10-CM

## 2013-07-25 DIAGNOSIS — A409 Streptococcal sepsis, unspecified: Secondary | ICD-10-CM | POA: Diagnosis present

## 2013-07-25 DIAGNOSIS — E860 Dehydration: Secondary | ICD-10-CM

## 2013-07-25 DIAGNOSIS — Z66 Do not resuscitate: Secondary | ICD-10-CM | POA: Diagnosis present

## 2013-07-25 DIAGNOSIS — K861 Other chronic pancreatitis: Secondary | ICD-10-CM | POA: Diagnosis present

## 2013-07-25 DIAGNOSIS — L03116 Cellulitis of left lower limb: Secondary | ICD-10-CM

## 2013-07-25 DIAGNOSIS — I5032 Chronic diastolic (congestive) heart failure: Secondary | ICD-10-CM | POA: Diagnosis present

## 2013-07-25 DIAGNOSIS — F3289 Other specified depressive episodes: Secondary | ICD-10-CM | POA: Diagnosis present

## 2013-07-25 DIAGNOSIS — D65 Disseminated intravascular coagulation [defibrination syndrome]: Secondary | ICD-10-CM | POA: Diagnosis present

## 2013-07-25 DIAGNOSIS — A4150 Gram-negative sepsis, unspecified: Secondary | ICD-10-CM

## 2013-07-25 DIAGNOSIS — E872 Acidosis, unspecified: Secondary | ICD-10-CM | POA: Diagnosis present

## 2013-07-25 DIAGNOSIS — R109 Unspecified abdominal pain: Secondary | ICD-10-CM | POA: Diagnosis present

## 2013-07-25 DIAGNOSIS — J441 Chronic obstructive pulmonary disease with (acute) exacerbation: Secondary | ICD-10-CM | POA: Diagnosis not present

## 2013-07-25 DIAGNOSIS — D638 Anemia in other chronic diseases classified elsewhere: Secondary | ICD-10-CM | POA: Diagnosis present

## 2013-07-25 DIAGNOSIS — Z79899 Other long term (current) drug therapy: Secondary | ICD-10-CM | POA: Diagnosis not present

## 2013-07-25 DIAGNOSIS — D696 Thrombocytopenia, unspecified: Secondary | ICD-10-CM

## 2013-07-25 DIAGNOSIS — Z880 Allergy status to penicillin: Secondary | ICD-10-CM | POA: Diagnosis not present

## 2013-07-25 DIAGNOSIS — A419 Sepsis, unspecified organism: Secondary | ICD-10-CM | POA: Diagnosis present

## 2013-07-25 DIAGNOSIS — Z833 Family history of diabetes mellitus: Secondary | ICD-10-CM | POA: Diagnosis not present

## 2013-07-25 DIAGNOSIS — R778 Other specified abnormalities of plasma proteins: Secondary | ICD-10-CM

## 2013-07-25 DIAGNOSIS — N135 Crossing vessel and stricture of ureter without hydronephrosis: Secondary | ICD-10-CM | POA: Diagnosis present

## 2013-07-25 DIAGNOSIS — K5289 Other specified noninfective gastroenteritis and colitis: Secondary | ICD-10-CM

## 2013-07-25 DIAGNOSIS — R7989 Other specified abnormal findings of blood chemistry: Secondary | ICD-10-CM

## 2013-07-25 DIAGNOSIS — R7402 Elevation of levels of lactic acid dehydrogenase (LDH): Secondary | ICD-10-CM | POA: Diagnosis present

## 2013-07-25 DIAGNOSIS — I248 Other forms of acute ischemic heart disease: Secondary | ICD-10-CM | POA: Diagnosis present

## 2013-07-25 DIAGNOSIS — F411 Generalized anxiety disorder: Secondary | ICD-10-CM | POA: Diagnosis present

## 2013-07-25 DIAGNOSIS — R634 Abnormal weight loss: Secondary | ICD-10-CM

## 2013-07-25 DIAGNOSIS — R413 Other amnesia: Secondary | ICD-10-CM

## 2013-07-25 DIAGNOSIS — R6520 Severe sepsis without septic shock: Secondary | ICD-10-CM

## 2013-07-25 DIAGNOSIS — N1330 Unspecified hydronephrosis: Secondary | ICD-10-CM

## 2013-07-25 DIAGNOSIS — R569 Unspecified convulsions: Secondary | ICD-10-CM

## 2013-07-25 DIAGNOSIS — D6959 Other secondary thrombocytopenia: Secondary | ICD-10-CM | POA: Diagnosis not present

## 2013-07-25 DIAGNOSIS — R6521 Severe sepsis with septic shock: Secondary | ICD-10-CM

## 2013-07-25 DIAGNOSIS — N10 Acute tubulo-interstitial nephritis: Secondary | ICD-10-CM | POA: Diagnosis present

## 2013-07-25 DIAGNOSIS — F32A Depression, unspecified: Secondary | ICD-10-CM

## 2013-07-25 DIAGNOSIS — R000 Tachycardia, unspecified: Secondary | ICD-10-CM | POA: Diagnosis present

## 2013-07-25 DIAGNOSIS — E876 Hypokalemia: Secondary | ICD-10-CM | POA: Diagnosis not present

## 2013-07-25 DIAGNOSIS — N179 Acute kidney failure, unspecified: Secondary | ICD-10-CM

## 2013-07-25 DIAGNOSIS — K219 Gastro-esophageal reflux disease without esophagitis: Secondary | ICD-10-CM | POA: Diagnosis present

## 2013-07-25 DIAGNOSIS — F329 Major depressive disorder, single episode, unspecified: Secondary | ICD-10-CM

## 2013-07-25 DIAGNOSIS — R609 Edema, unspecified: Secondary | ICD-10-CM | POA: Diagnosis present

## 2013-07-25 DIAGNOSIS — K529 Noninfective gastroenteritis and colitis, unspecified: Secondary | ICD-10-CM | POA: Diagnosis present

## 2013-07-25 DIAGNOSIS — G40909 Epilepsy, unspecified, not intractable, without status epilepticus: Secondary | ICD-10-CM | POA: Diagnosis present

## 2013-07-25 DIAGNOSIS — R799 Abnormal finding of blood chemistry, unspecified: Secondary | ICD-10-CM | POA: Diagnosis present

## 2013-07-25 DIAGNOSIS — J9600 Acute respiratory failure, unspecified whether with hypoxia or hypercapnia: Secondary | ICD-10-CM | POA: Diagnosis not present

## 2013-07-25 DIAGNOSIS — R197 Diarrhea, unspecified: Secondary | ICD-10-CM | POA: Diagnosis present

## 2013-07-25 DIAGNOSIS — R7401 Elevation of levels of liver transaminase levels: Secondary | ICD-10-CM | POA: Diagnosis present

## 2013-07-25 DIAGNOSIS — Z87891 Personal history of nicotine dependence: Secondary | ICD-10-CM

## 2013-07-25 DIAGNOSIS — Z8673 Personal history of transient ischemic attack (TIA), and cerebral infarction without residual deficits: Secondary | ICD-10-CM

## 2013-07-25 DIAGNOSIS — L03115 Cellulitis of right lower limb: Secondary | ICD-10-CM

## 2013-07-25 DIAGNOSIS — I214 Non-ST elevation (NSTEMI) myocardial infarction: Secondary | ICD-10-CM | POA: Diagnosis not present

## 2013-07-25 DIAGNOSIS — A4181 Sepsis due to Enterococcus: Secondary | ICD-10-CM

## 2013-07-25 DIAGNOSIS — R7881 Bacteremia: Secondary | ICD-10-CM

## 2013-07-25 DIAGNOSIS — Z885 Allergy status to narcotic agent status: Secondary | ICD-10-CM | POA: Diagnosis not present

## 2013-07-25 DIAGNOSIS — I609 Nontraumatic subarachnoid hemorrhage, unspecified: Secondary | ICD-10-CM

## 2013-07-25 DIAGNOSIS — I2489 Other forms of acute ischemic heart disease: Secondary | ICD-10-CM | POA: Diagnosis present

## 2013-07-25 DIAGNOSIS — Z85828 Personal history of other malignant neoplasm of skin: Secondary | ICD-10-CM

## 2013-07-25 DIAGNOSIS — N200 Calculus of kidney: Secondary | ICD-10-CM | POA: Diagnosis present

## 2013-07-25 DIAGNOSIS — E871 Hypo-osmolality and hyponatremia: Secondary | ICD-10-CM | POA: Diagnosis present

## 2013-07-25 DIAGNOSIS — K921 Melena: Secondary | ICD-10-CM

## 2013-07-25 DIAGNOSIS — K52839 Microscopic colitis, unspecified: Secondary | ICD-10-CM

## 2013-07-25 DIAGNOSIS — J9601 Acute respiratory failure with hypoxia: Secondary | ICD-10-CM

## 2013-07-25 DIAGNOSIS — B952 Enterococcus as the cause of diseases classified elsewhere: Secondary | ICD-10-CM

## 2013-07-25 LAB — CBC WITH DIFFERENTIAL/PLATELET
BASOS ABS: 0 10*3/uL (ref 0.0–0.1)
Basophils Relative: 0 % (ref 0–1)
EOS ABS: 0 10*3/uL (ref 0.0–0.7)
EOS PCT: 0 % (ref 0–5)
HCT: 35.9 % — ABNORMAL LOW (ref 36.0–46.0)
Hemoglobin: 12.3 g/dL (ref 12.0–15.0)
LYMPHS ABS: 0.2 10*3/uL — AB (ref 0.7–4.0)
LYMPHS PCT: 2 % — AB (ref 12–46)
MCH: 32.3 pg (ref 26.0–34.0)
MCHC: 34.3 g/dL (ref 30.0–36.0)
MCV: 94.2 fL (ref 78.0–100.0)
Monocytes Absolute: 0.4 10*3/uL (ref 0.1–1.0)
Monocytes Relative: 3 % (ref 3–12)
NEUTROS PCT: 96 % — AB (ref 43–77)
Neutro Abs: 13.3 10*3/uL — ABNORMAL HIGH (ref 1.7–7.7)
Platelets: 88 10*3/uL — ABNORMAL LOW (ref 150–400)
RBC: 3.81 MIL/uL — ABNORMAL LOW (ref 3.87–5.11)
RDW: 15.3 % (ref 11.5–15.5)
WBC MORPHOLOGY: INCREASED
WBC: 13.9 10*3/uL — ABNORMAL HIGH (ref 4.0–10.5)

## 2013-07-25 LAB — URINE MICROSCOPIC-ADD ON

## 2013-07-25 LAB — HEPATIC FUNCTION PANEL
ALT: 62 U/L — ABNORMAL HIGH (ref 0–35)
AST: 79 U/L — ABNORMAL HIGH (ref 0–37)
Albumin: 2.6 g/dL — ABNORMAL LOW (ref 3.5–5.2)
Alkaline Phosphatase: 63 U/L (ref 39–117)
BILIRUBIN TOTAL: 0.4 mg/dL (ref 0.3–1.2)
Total Protein: 5.9 g/dL — ABNORMAL LOW (ref 6.0–8.3)

## 2013-07-25 LAB — URINALYSIS, ROUTINE W REFLEX MICROSCOPIC
Bilirubin Urine: NEGATIVE
Glucose, UA: NEGATIVE mg/dL
KETONES UR: NEGATIVE mg/dL
NITRITE: NEGATIVE
PROTEIN: 100 mg/dL — AB
Specific Gravity, Urine: <1.005 — ABNORMAL LOW (ref 1.005–1.030)
UROBILINOGEN UA: 0.2 mg/dL (ref 0.0–1.0)
pH: >9 — ABNORMAL HIGH (ref 5.0–8.0)

## 2013-07-25 LAB — TROPONIN I
TROPONIN I: 0.54 ng/mL — AB (ref <0.30)
Troponin I: 0.48 ng/mL (ref <0.30)
Troponin I: 0.43 ng/mL (ref <0.30)

## 2013-07-25 LAB — BASIC METABOLIC PANEL
ANION GAP: 12 (ref 5–15)
BUN: 38 mg/dL — ABNORMAL HIGH (ref 6–23)
CO2: 27 mEq/L (ref 19–32)
Calcium: 8.2 mg/dL — ABNORMAL LOW (ref 8.4–10.5)
Chloride: 96 mEq/L (ref 96–112)
Creatinine, Ser: 1.7 mg/dL — ABNORMAL HIGH (ref 0.50–1.10)
GFR calc non Af Amer: 27 mL/min — ABNORMAL LOW (ref >90)
GFR, EST AFRICAN AMERICAN: 31 mL/min — AB (ref >90)
Glucose, Bld: 106 mg/dL — ABNORMAL HIGH (ref 70–99)
Potassium: 3.5 mEq/L — ABNORMAL LOW (ref 3.7–5.3)
SODIUM: 135 meq/L — AB (ref 137–147)

## 2013-07-25 LAB — LACTIC ACID, PLASMA: Lactic Acid, Venous: 1.6 mmol/L (ref 0.5–2.2)

## 2013-07-25 LAB — MRSA PCR SCREENING: MRSA by PCR: NEGATIVE

## 2013-07-25 LAB — LIPASE, BLOOD: LIPASE: 12 U/L (ref 11–59)

## 2013-07-25 MED ORDER — ONDANSETRON HCL 4 MG PO TABS: 4.0000 mg | ORAL_TABLET | Freq: Four times a day (QID) | ORAL | Status: DC | PRN

## 2013-07-25 MED ORDER — CITALOPRAM HYDROBROMIDE 10 MG PO TABS
10.0000 mg | ORAL_TABLET | Freq: Every day | ORAL | Status: DC
  Administered 2013-07-26 – 2013-08-03 (×8): 10 mg via ORAL
  Filled 2013-07-25 (×8): qty 1

## 2013-07-25 MED ORDER — SODIUM CHLORIDE 0.9 % IV BOLUS (SEPSIS)
500.0000 mL | Freq: Once | INTRAVENOUS | Status: AC
  Administered 2013-07-25: 1000 mL via INTRAVENOUS

## 2013-07-25 MED ORDER — ACETAMINOPHEN 650 MG RE SUPP: 650.0000 mg | Freq: Four times a day (QID) | RECTAL | Status: DC | PRN

## 2013-07-25 MED ORDER — ASPIRIN 325 MG PO TABS
ORAL_TABLET | ORAL | Status: AC
  Administered 2013-07-25: 325 mg via ORAL
  Filled 2013-07-25: qty 1

## 2013-07-25 MED ORDER — SODIUM CHLORIDE 0.9 % IJ SOLN
3.0000 mL | Freq: Two times a day (BID) | INTRAMUSCULAR | Status: DC
  Administered 2013-07-25 – 2013-07-30 (×7): 3 mL via INTRAVENOUS

## 2013-07-25 MED ORDER — ACETAMINOPHEN 325 MG PO TABS: 650.0000 mg | ORAL_TABLET | Freq: Four times a day (QID) | ORAL | Status: DC | PRN

## 2013-07-25 MED ORDER — ENOXAPARIN SODIUM 40 MG/0.4ML ~~LOC~~ SOLN: 40.0000 mg | SUBCUTANEOUS | Status: DC

## 2013-07-25 MED ORDER — BUDESONIDE-FORMOTEROL FUMARATE 160-4.5 MCG/ACT IN AERO
2.0000 | INHALATION_SPRAY | Freq: Two times a day (BID) | RESPIRATORY_TRACT | Status: DC
  Administered 2013-07-25 – 2013-07-26 (×2): 2 via RESPIRATORY_TRACT
  Filled 2013-07-25: qty 6

## 2013-07-25 MED ORDER — SODIUM CHLORIDE 0.9 % IV SOLN
INTRAVENOUS | Status: DC
  Administered 2013-07-26: 1000 mL via INTRAVENOUS

## 2013-07-25 MED ORDER — PANTOPRAZOLE SODIUM 40 MG PO TBEC
40.0000 mg | DELAYED_RELEASE_TABLET | Freq: Every day | ORAL | Status: DC
  Administered 2013-07-26 – 2013-08-03 (×8): 40 mg via ORAL
  Filled 2013-07-25 (×9): qty 1

## 2013-07-25 MED ORDER — BUDESONIDE-FORMOTEROL FUMARATE 160-4.5 MCG/ACT IN AERO
INHALATION_SPRAY | RESPIRATORY_TRACT | Status: AC
  Filled 2013-07-25: qty 6

## 2013-07-25 MED ORDER — LORAZEPAM 0.5 MG PO TABS
0.5000 mg | ORAL_TABLET | Freq: Three times a day (TID) | ORAL | Status: DC
  Administered 2013-07-26: 0.5 mg via ORAL
  Filled 2013-07-25 (×3): qty 1

## 2013-07-25 MED ORDER — ONDANSETRON HCL 4 MG/2ML IJ SOLN
4.0000 mg | Freq: Once | INTRAMUSCULAR | Status: AC
  Administered 2013-07-25: 4 mg via INTRAVENOUS
  Filled 2013-07-25: qty 2

## 2013-07-25 MED ORDER — ASPIRIN EC 325 MG PO TBEC
325.0000 mg | DELAYED_RELEASE_TABLET | Freq: Every day | ORAL | Status: DC
  Filled 2013-07-25: qty 1

## 2013-07-25 MED ORDER — ONDANSETRON HCL 4 MG/2ML IJ SOLN: 4.0000 mg | Freq: Four times a day (QID) | INTRAMUSCULAR | Status: DC | PRN

## 2013-07-25 MED ORDER — BIOTENE DRY MOUTH MT LIQD
15.0000 mL | Freq: Two times a day (BID) | OROMUCOSAL | Status: DC
  Administered 2013-07-25 – 2013-08-03 (×16): 15 mL via OROMUCOSAL

## 2013-07-25 NOTE — Plan of Care (Signed)
Problem: Consults Goal: ARF/New Onset CRF Patient Education See Patient Education Module for education specifics. Outcome: Progressing Monitoring patient for UOP and vitals  Goal: Skin Care Protocol Initiated - if Braden Score 18 or less If consults are not indicated, leave blank or document N/A Outcome: Progressing Frail Goal: Nutrition Consult-if indicated Outcome: Progressing Good appetite  Problem: Phase I Progression Outcomes Goal: Dyspnea controlled at rest Outcome: Completed/Met Date Met:  07/25/13 No SOB at present, hx of COPD Goal: Pain controlled with appropriate interventions Outcome: Not Applicable Date Met:  07/25/13 No complaints of pain Goal: Initial discharge plan identified Lives home alone, son and daughter Goal: Hemodynamically stable Outcome: Progressing BP soft, multiple bouts of stool diarrhea in ED and patient admits at home, dehydration suspected, IVF infusing     

## 2013-07-25 NOTE — Consult Note (Signed)
Urology Consult   Physician requesting consult: Dr. Darrick Meigs  Reason for consult: Blocked left kidney  History of Present Illness: Bridget Ball is a 80 y.o. female who presented to the emergency room initially on July 4 with abdominal pain. That was coupled with nausea, vomiting and diarrhea. She does have a history of colitis apparently. She had low oral intake. She was found to have, on CT scan, cecal wall thickening, as well as thickening of the ascending proximal and transverse colon. Also found was cholelithiasis, chronic pancreatitis with a stone in the pancreatic duct, sigmoid diverticulosis and significant left hydronephrosis. There was excretion of contrast into that left collecting system, but it was slightly delayed compared to the right. Followup CT scan on 07/10/2013 (CT angiogram) revealed decreased hydronephrosis compared to 2 days before. She read presented to the emergency room on July 17, with persistent symptoms which were treated in the emergency room. CT scan at that time revealed persistent, slightly worsening hydronephrosis on the left.  At this point, she comes back into the emergency room with diarrhea, generalized abdominal pain and renal insufficiency. She's had decreased by mouth intake and increased number of stools. CT scan was performed today revealing persistent contrast in the left renal collecting system and persistent hydronephrosis. Urologic consultation was requested.  The patient's son, Clair Gulling is in the emergency room with her. He is helping with the history. She denies long-standing history of intermittent flank or left abdominal pain. She denies any pain with diuresis/increase fluid intake. She has not been treated for frequent urinary tract infections. She denies long-standing urologic history-it sounds as if she perhaps had urethral dilations in the past.  She denies a history of voiding or storage urinary symptoms, hematuria, UTIs, STDs, urolithiasis, GU  malignancy/trauma/surgery.  Past Medical History  Diagnosis Date  . COPD (chronic obstructive pulmonary disease)   . Arthritis   . Stroke     TIA four years ago  . Depression   . Seizures     x 2 last year due to bleeding to the brain.  . Memory difficulties   . Chronic diarrhea   . Hiatal hernia   . Cancer     Skin cancer    Past Surgical History  Procedure Laterality Date  . Appendectomy    . Skin cancer excision    . Tubal ligation    . Colonoscopy N/A 05/24/2013    Procedure: COLONOSCOPY;  Surgeon: Rogene Houston, MD;  [facility];  Service: Endoscopy;  Laterality: N/A;  100  . Esophagogastroduodenoscopy N/A 05/24/2013    Procedure: ESOPHAGOGASTRODUODENOSCOPY (EGD);  Surgeon: Rogene Houston, MD;  [facility];  Service: Endoscopy;  Laterality: N/A;     Current Hospital Medications: Scheduled Meds:  Continuous Infusions: . sodium chloride     PRN Meds:.  Allergies:  Allergies  Allergen Reactions  . Penicillins Anaphylaxis  . Dilaudid [Hydromorphone Hcl]     Severe sedation per son    Family History  Problem Relation Age of Onset  . Diabetes Mother     Social History:  reports that she quit smoking about 15 years ago. She does not have any smokeless tobacco history on file. She reports that she drinks about 4.2 ounces of alcohol per week. She reports that she does not use illicit drugs.  ROS: A complete review of systems was performed.  All systems are negative except for pertinent findings as noted.  Physical Exam:  Vital signs in last 24 hours:  Temp:  [97.1 F (36.2 C)-99.2 F (37.3 C)] 97.1 F (36.2 C) (07/21 1153) Pulse Rate:  [84-108] 106 (07/21 1700) Resp:  [16-24] 24 (07/21 1700) BP: (90-114)/(59-72) 103/66 mmHg (07/21 1700) SpO2:  [95 %-96 %] 95 % (07/21 1700) Weight:  [46.267 kg (102 lb)-46.409 kg (102 lb 5 oz)] 46.267 kg (102 lb) (07/21 1153) General:  Alert and oriented, she seems mildly uncomfortable. She answers  most questions appropriately HEENT: Normocephalic, atraumatic. Temporal wasting noted. Neck: No JVD or lymphadenopathy Cardiovascular: Regular rate and rhythm Lungs: Clear bilaterally, decreased inspiratory effort Abdomen: Soft, mildly distended. Generalized abdominal tenderness, mostly in the lower abdomen bilaterally. Left greater than right CVA tenderness, mild. No rebound or guarding Extremities: No edema Neurologic: Grossly intact  Laboratory Data:   Recent Labs  07/25/13 1225  WBC 13.9*  HGB 12.3  HCT 35.9*  PLT 88*     Recent Labs  07/25/13 1225  NA 135*  K 3.5*  CL 96  GLUCOSE 106*  BUN 38*  CALCIUM 8.2*  CREATININE 1.70*     Results for orders placed during the hospital encounter of 07/25/13 (from the past 24 hour(s))  CBC WITH DIFFERENTIAL     Status: Abnormal   Collection Time    07/25/13 12:25 PM      Result Value Ref Range   WBC 13.9 (*) 4.0 - 10.5 K/uL   RBC 3.81 (*) 3.87 - 5.11 MIL/uL   Hemoglobin 12.3  12.0 - 15.0 g/dL   HCT 35.9 (*) 36.0 - 46.0 %   MCV 94.2  78.0 - 100.0 fL   MCH 32.3  26.0 - 34.0 pg   MCHC 34.3  30.0 - 36.0 g/dL   RDW 15.3  11.5 - 15.5 %   Platelets 88 (*) 150 - 400 K/uL   Neutrophils Relative % 96 (*) 43 - 77 %   Neutro Abs 13.3 (*) 1.7 - 7.7 K/uL   Lymphocytes Relative 2 (*) 12 - 46 %   Lymphs Abs 0.2 (*) 0.7 - 4.0 K/uL   Monocytes Relative 3  3 - 12 %   Monocytes Absolute 0.4  0.1 - 1.0 K/uL   Eosinophils Relative 0  0 - 5 %   Eosinophils Absolute 0.0  0.0 - 0.7 K/uL   Basophils Relative 0  0 - 1 %   Basophils Absolute 0.0  0.0 - 0.1 K/uL   WBC Morphology INCREASED BANDS (>20% BANDS)    BASIC METABOLIC PANEL     Status: Abnormal   Collection Time    07/25/13 12:25 PM      Result Value Ref Range   Sodium 135 (*) 137 - 147 mEq/L   Potassium 3.5 (*) 3.7 - 5.3 mEq/L   Chloride 96  96 - 112 mEq/L   CO2 27  19 - 32 mEq/L   Glucose, Bld 106 (*) 70 - 99 mg/dL   BUN 38 (*) 6 - 23 mg/dL   Creatinine, Ser 1.70 (*) 0.50 -  1.10 mg/dL   Calcium 8.2 (*) 8.4 - 10.5 mg/dL   GFR calc non Af Amer 27 (*) >90 mL/min   GFR calc Af Amer 31 (*) >90 mL/min   Anion gap 12  5 - 15  HEPATIC FUNCTION PANEL     Status: Abnormal   Collection Time    07/25/13 12:25 PM      Result Value Ref Range   Total Protein 5.9 (*) 6.0 - 8.3 g/dL   Albumin 2.6 (*) 3.5 - 5.2  g/dL   AST 79 (*) 0 - 37 U/L   ALT 62 (*) 0 - 35 U/L   Alkaline Phosphatase 63  39 - 117 U/L   Total Bilirubin 0.4  0.3 - 1.2 mg/dL   Bilirubin, Direct <0.2  0.0 - 0.3 mg/dL   Indirect Bilirubin NOT CALCULATED  0.3 - 0.9 mg/dL  LIPASE, BLOOD     Status: None   Collection Time    07/25/13 12:25 PM      Result Value Ref Range   Lipase 12  11 - 59 U/L  TROPONIN I     Status: Abnormal   Collection Time    07/25/13 12:25 PM      Result Value Ref Range   Troponin I 0.48 (*) <0.30 ng/mL  LACTIC ACID, PLASMA     Status: None   Collection Time    07/25/13 12:52 PM      Result Value Ref Range   Lactic Acid, Venous 1.6  0.5 - 2.2 mmol/L  TROPONIN I     Status: Abnormal   Collection Time    07/25/13  3:14 PM      Result Value Ref Range   Troponin I 0.54 (*) <0.30 ng/mL   No results found for this or any previous visit (from the past 240 hour(s)).  Renal Function:  Recent Labs  07/21/13 1745 07/25/13 1225  CREATININE 0.82 1.70*   Estimated Creatinine Clearance: 18.7 ml/min (by C-G formula based on Cr of 1.7).  Radiologic Imaging: Ct Abdomen Pelvis Wo Contrast  07/25/2013   CLINICAL DATA:  Nausea, vomiting and diarrhea.  EXAM: CT ABDOMEN AND PELVIS WITHOUT CONTRAST  TECHNIQUE: Multidetector CT imaging of the abdomen and pelvis was performed following the standard protocol without IV contrast.  COMPARISON:  07/21/2013.  FINDINGS: The lung bases are stable.  Patchy bibasilar atelectasis.  The liver is grossly normal and stable. Small cysts are again demonstrated. Cholelithiasis is noted. The spleen is normal in size. No focal lesions. The pancreas is grossly  normal.  Severe left-sided hydronephrosis due to a chronic UPJ obstruction. No obstructing ureteral calculus. There is persistent contrast in the left kidney from the recent CT scan. The right kidney is normal.  The bladder is unremarkable. Uterus and ovaries are unremarkable and stable. No mesenteric or retroperitoneal mass or adenopathy. Stable scattered lymph nodes. Stable advanced atherosclerotic calcifications involving the aorta and branch vessels.  No pelvic mass or adenopathy.  No inguinal mass or adenopathy.  The bony structures are stable.  IMPRESSION: Stable left-sided UPJ obstruction with severe hydronephrosis.  Chololithiasis.  Bibasilar atelectasis.  Stable hepatic cysts.   Electronically Signed   By: Kalman Jewels M.D.   On: 07/25/2013 14:20   Dg Chest 2 View  07/25/2013   CLINICAL DATA:  Severe abdominal pain  EXAM: CHEST  2 VIEW  COMPARISON:  None.  FINDINGS: The lungs are hyperinflated likely secondary to COPD. There is no focal parenchymal opacity, pleural effusion, or pneumothorax. The heart and mediastinal contours are unremarkable. There is thoracic aortic atherosclerosis.  The osseous structures are unremarkable.  IMPRESSION: No active cardiopulmonary disease.   Electronically Signed   By: Kathreen Devoid   On: 07/25/2013 13:51    I independently reviewed the above imaging studies.  Impression/Assessment:  1. Left-sided hydronephrosis. It's hard to find out how long this is present. I looked back to her old imaging studies to 2004. Prior 2015, no abdominal imaging studies have been done. This may be a long-standing/chronic  UPJ obstruction. However, there is a change in her hydronephrosis between July 6 and today. This may be worsening, and may well be causing at least some of her symptoms. Etiologies could include a chronic UPJ obstruction from benign causes, or neoplasm (much less likely). I see no stone here.  2. Renal insufficiency, may well be due to the patient's volume  depletion. Her hydronephrosis was significant on the 17th, and she had stable renal function at that time.  Plan:  1. I would recommend cystoscopy, left retrograde and possible stent placement. If her UPJ obstruction/hydronephrosis is causing her pain, it would be fairly easy to see if this causes resolution. There will not be covered up here in [place] the rest of the week. I would suggest, and talk to her son about it, transferring to [facility]. At that stone in the next day or 2, we would be able to schedule urgent cystoscopy, retrograde pyelogram and possible stent placement.  2. At this point, I don't think she needs to be treated for UTI-urinalysis looks clear.  3. If she is transferred to [facility] long, please have the hospitalist consult [place] for further management.  4. I spoke with the patient and her son, Clair Gulling regarding this-they concur with the above plan  CC: Dr. Darrick Meigs    2.

## 2013-07-25 NOTE — ED Provider Notes (Addendum)
CSN: [id number]     Arrival date & time 07/25/13  1121 History  This chart was scribed for Fredia Sorrow, MD by Starleen Arms, ED Scribe. This patient was seen in room APA03/APA03 and the patient's care was started at 12:49 PM.    Chief Complaint  Patient presents with  . Nausea  . Abdominal Pain   LEVEL 5 CAVEAT (Altered Mental State)  The history is provided by the patient and a relative. No language interpreter was used.   HPI Comments: Bridget Ball is a 80 y.o. female who presents to the Emergency Department complaining of constant, severe abdominal pain that began over 1 week ago.  Pt was seen in ED on 717 for the same complaint and had abdominal CT performed. Per son, patient was given 2 doses of Dilaudid and discharged home. She was advised to follow up with her GI physician, Dr. Laural Golden, and an appointment was scheduled for this morning.  Son states patient's pain returned along with 2 days of intermittent diarrhea, lack of appetite, and generalized weakness.  When she arrived for her appointment this morning, she was told to go to the Emergency Department. The pt's son also reports that he tried to ensure patient was drinking enough fluids, but found it difficult to ensure.   Patient's son reports patient has pancreatic stones which are scheduled to be removed in 2 weeks at [facility]. He states she also has kidney stones.   PCP: Tsosie Billing  Past Medical History  Diagnosis Date  . COPD (chronic obstructive pulmonary disease)   . Arthritis   . Stroke     TIA four years ago  . Depression   . Cancer     Skin cancer  . Seizures     x 2 last year due to bleeding to the brain.  . Memory difficulties   . Chronic diarrhea   . Hiatal hernia    Past Surgical History  Procedure Laterality Date  . Appendectomy    . Skin cancer excision    . Tubal ligation    . Colonoscopy N/A 05/24/2013    Procedure: COLONOSCOPY;  Surgeon: Rogene Houston, MD;  [facility];   Service: Endoscopy;  Laterality: N/A;  100  . Esophagogastroduodenoscopy N/A 05/24/2013    Procedure: ESOPHAGOGASTRODUODENOSCOPY (EGD);  Surgeon: Rogene Houston, MD;  Location: [facility];  Service: Endoscopy;  Laterality: N/A;   Family History  Problem Relation Age of Onset  . Diabetes Mother    History  Substance Use Topics  . Smoking status: Former Smoker -- 1.00 packs/day for 50 years    Quit date: 11/13/1997  . Smokeless tobacco: Not on file  . Alcohol Use: 4.2 oz/week    7 Glasses of wine per week     Comment: 2 glasses a night wine (none in a year)   OB History   Grav Para Term Preterm Abortions TAB SAB Ect Mult Living                 Review of Systems  Unable to perform ROS: Mental status change      Allergies  Penicillins and Dilaudid  Home Medications   Prior to Admission medications   Medication Sig Start Date End Date Taking? Authorizing Provider  budesonide (ENTOCORT EC) 3 MG 24 hr capsule Take 1 capsule (3 mg total) by mouth 2 (two) times daily. 07/12/13  Yes Nita Sells, MD  budesonide-formoterol (SYMBICORT) 160-4.5 MCG/ACT inhaler Inhale 2 puffs into  the lungs 2 (two) times daily.   Yes Historical Provider, MD  citalopram (CELEXA) 10 MG tablet TAKE 1 TABLET (10 MG TOTAL) BY MOUTH DAILY. 07/24/13  Yes Antony Contras, MD  docusate sodium (COLACE) 100 MG capsule Take one capsule a day unless diarrhea begins 07/21/13  Yes Maudry Diego, MD  HYDROcodone-acetaminophen (NORCO/VICODIN) 5-325 MG per tablet Take one tablet every 6-8 hours for pain 07/21/13  Yes Maudry Diego, MD  LORazepam (ATIVAN) 0.5 MG tablet Take 0.5 mg by mouth 3 (three) times daily.   Yes Historical Provider, MD  Melatonin 5 MG TABS Take 5 mg by mouth at bedtime.   Yes Historical Provider, MD  pantoprazole (PROTONIX) 40 MG tablet Take 1 tablet (40 mg total) by mouth daily before breakfast. 05/24/13  Yes Rogene Houston, MD   BP 114/72  Pulse 108  Temp(Src) 99.2 F (37.3 C) (Oral)   Resp 16  Ht 5\' 2"  (1.575 m)  Wt 102 lb 5 oz (46.409 kg)  BMI 18.71 kg/m2  SpO2 95% Physical Exam  Nursing note and vitals reviewed. Constitutional: She appears well-developed and well-nourished. No distress.  HENT:  Head: Normocephalic and atraumatic.  Dry mucous membranes.  Eyes: Conjunctivae and EOM are normal.  Neck: Neck supple. No tracheal deviation present.  Cardiovascular: Normal rate and regular rhythm.   Pulmonary/Chest: Effort normal and breath sounds normal. No respiratory distress. She has no wheezes. She has no rales.  Abdominal: Soft. Bowel sounds are normal. She exhibits no distension. There is no tenderness.  Musculoskeletal: Normal range of motion. She exhibits no edema.  Neurological: She is alert. No cranial nerve deficit.  Skin: Skin is warm and dry.  Psychiatric: She has a normal mood and affect. Her behavior is normal.    ED Course  Procedures (including critical care time)  DIAGNOSTIC STUDIES: Oxygen Saturation is 96% on RA, normal by my interpretation.    COORDINATION OF CARE:  12:56 PM Discussed plan to review 7/17 abdominal CT.    Labs Review Labs Reviewed  CBC WITH DIFFERENTIAL - Abnormal; Notable for the following:    WBC 13.9 (*)    RBC 3.81 (*)    HCT 35.9 (*)    Platelets 88 (*)    Neutrophils Relative % 96 (*)    Neutro Abs 13.3 (*)    Lymphocytes Relative 2 (*)    Lymphs Abs 0.2 (*)    All other components within normal limits  BASIC METABOLIC PANEL - Abnormal; Notable for the following:    Sodium 135 (*)    Potassium 3.5 (*)    Glucose, Bld 106 (*)    BUN 38 (*)    Creatinine, Ser 1.70 (*)    Calcium 8.2 (*)    GFR calc non Af Amer 27 (*)    GFR calc Af Amer 31 (*)    All other components within normal limits  HEPATIC FUNCTION PANEL - Abnormal; Notable for the following:    Total Protein 5.9 (*)    Albumin 2.6 (*)    AST 79 (*)    ALT 62 (*)    All other components within normal limits  TROPONIN I - Abnormal; Notable for  the following:    Troponin I 0.48 (*)    All other components within normal limits  LIPASE, BLOOD  LACTIC ACID, PLASMA  URINALYSIS, ROUTINE W REFLEX MICROSCOPIC  TROPONIN I   Results for orders placed during the hospital encounter of 07/25/13  CBC WITH DIFFERENTIAL  Result Value Ref Range   WBC 13.9 (*) 4.0 - 10.5 K/uL   RBC 3.81 (*) 3.87 - 5.11 MIL/uL   Hemoglobin 12.3  12.0 - 15.0 g/dL   HCT 35.9 (*) 36.0 - 46.0 %   MCV 94.2  78.0 - 100.0 fL   MCH 32.3  26.0 - 34.0 pg   MCHC 34.3  30.0 - 36.0 g/dL   RDW 15.3  11.5 - 15.5 %   Platelets 88 (*) 150 - 400 K/uL   Neutrophils Relative % 96 (*) 43 - 77 %   Neutro Abs 13.3 (*) 1.7 - 7.7 K/uL   Lymphocytes Relative 2 (*) 12 - 46 %   Lymphs Abs 0.2 (*) 0.7 - 4.0 K/uL   Monocytes Relative 3  3 - 12 %   Monocytes Absolute 0.4  0.1 - 1.0 K/uL   Eosinophils Relative 0  0 - 5 %   Eosinophils Absolute 0.0  0.0 - 0.7 K/uL   Basophils Relative 0  0 - 1 %   Basophils Absolute 0.0  0.0 - 0.1 K/uL   WBC Morphology INCREASED BANDS (>20% BANDS)    BASIC METABOLIC PANEL      Result Value Ref Range   Sodium 135 (*) 137 - 147 mEq/L   Potassium 3.5 (*) 3.7 - 5.3 mEq/L   Chloride 96  96 - 112 mEq/L   CO2 27  19 - 32 mEq/L   Glucose, Bld 106 (*) 70 - 99 mg/dL   BUN 38 (*) 6 - 23 mg/dL   Creatinine, Ser 1.70 (*) 0.50 - 1.10 mg/dL   Calcium 8.2 (*) 8.4 - 10.5 mg/dL   GFR calc non Af Amer 27 (*) >90 mL/min   GFR calc Af Amer 31 (*) >90 mL/min   Anion gap 12  5 - 15  HEPATIC FUNCTION PANEL      Result Value Ref Range   Total Protein 5.9 (*) 6.0 - 8.3 g/dL   Albumin 2.6 (*) 3.5 - 5.2 g/dL   AST 79 (*) 0 - 37 U/L   ALT 62 (*) 0 - 35 U/L   Alkaline Phosphatase 63  39 - 117 U/L   Total Bilirubin 0.4  0.3 - 1.2 mg/dL   Bilirubin, Direct <0.2  0.0 - 0.3 mg/dL   Indirect Bilirubin NOT CALCULATED  0.3 - 0.9 mg/dL  LIPASE, BLOOD      Result Value Ref Range   Lipase 12  11 - 59 U/L  TROPONIN I      Result Value Ref Range   Troponin I 0.48 (*)  <0.30 ng/mL  LACTIC ACID, PLASMA      Result Value Ref Range   Lactic Acid, Venous 1.6  0.5 - 2.2 mmol/L     Imaging Review Ct Abdomen Pelvis Wo Contrast  07/25/2013   CLINICAL DATA:  Nausea, vomiting and diarrhea.  EXAM: CT ABDOMEN AND PELVIS WITHOUT CONTRAST  TECHNIQUE: Multidetector CT imaging of the abdomen and pelvis was performed following the standard protocol without IV contrast.  COMPARISON:  07/21/2013.  FINDINGS: The lung bases are stable.  Patchy bibasilar atelectasis.  The liver is grossly normal and stable. Small cysts are again demonstrated. Cholelithiasis is noted. The spleen is normal in size. No focal lesions. The pancreas is grossly normal.  Severe left-sided hydronephrosis due to a chronic UPJ obstruction. No obstructing ureteral calculus. There is persistent contrast in the left kidney from the recent CT scan. The right kidney is normal.  The bladder is  unremarkable. Uterus and ovaries are unremarkable and stable. No mesenteric or retroperitoneal mass or adenopathy. Stable scattered lymph nodes. Stable advanced atherosclerotic calcifications involving the aorta and branch vessels.  No pelvic mass or adenopathy.  No inguinal mass or adenopathy.  The bony structures are stable.  IMPRESSION: Stable left-sided UPJ obstruction with severe hydronephrosis.  Chololithiasis.  Bibasilar atelectasis.  Stable hepatic cysts.   Electronically Signed   By: Kalman Jewels M.D.   On: 07/25/2013 14:20   Dg Chest 2 View  07/25/2013   CLINICAL DATA:  Severe abdominal pain  EXAM: CHEST  2 VIEW  COMPARISON:  None.  FINDINGS: The lungs are hyperinflated likely secondary to COPD. There is no focal parenchymal opacity, pleural effusion, or pneumothorax. The heart and mediastinal contours are unremarkable. There is thoracic aortic atherosclerosis.  The osseous structures are unremarkable.  IMPRESSION: No active cardiopulmonary disease.   Electronically Signed   By: Kathreen Devoid   On: 07/25/2013 13:51      EKG Interpretation   Date/Time:  Tuesday July 25 2013 14:12:32 EDT Ventricular Rate:  108 PR Interval:  188 QRS Duration: 113 QT Interval:  340 QTC Calculation: 456 R Axis:   75 Text Interpretation:  Sinus tachycardia Incomplete right bundle branch  block Anteroseptal infarct, old ST elevation, consider inferior injury New  since previous tracing Confirmed by Edie Vallandingham  MD, Allenmichael Mcpartlin [phone]) on  07/25/2013 2:19:10 PM      MDM   Final diagnoses:  Dehydration  Abdominal pain, unspecified site  Hydronephrosis, left    Patient brought in by family members. Patient essentially presenting for the same thing that she was seen on July 17 4. Patient was under consideration for admission at that time. At that time to CT scan the identified significant left hydronephrosis. Patient eventually was not admitted was discharged home with pain medicine. According to family patient was presumed out by the medicine. Patient still having abdominal pain ongoing for a week still having diarrhea. Supposedly nonbloody. No vomiting. Patient was in her GI doctors office today noted that she was dehydrated not looking well very weak sent over here for appropriate reevaluation.  Workup here shows a patient is clinically dehydrated. Elevated BUN and creatinine to support that. Lactic acid is less than 2. Mild liver function test abnormalities. Moderate leukocytosis no significant anemia. Patient's EKG had some subtle changes inferiorly compared to before but could be rate related. Not consistent with a STEMI. Chest x-ray here no significant acute processes. Troponin with a mild elevation of 0.48. Troponin was normal on Friday patient without any chest pain. His troponin could BE affected by the elevated creatinine.  Consult internal medicine here they felt the patient could not stay here due to the troponin initially by cardiology involved already had a colon to urology for their input. Internal medicine hospitalist  they'll determine that they feel comfortable probably p.m. to keep the patient here. Cardiology is available as needed. Repeat troponin has been ordered. Will confirm that he urology is okay with patient staying here.  I personally performed the services described in this documentation, which was scribed in my presence. The recorded information has been reviewed and is accurate.      Fredia Sorrow, MD 07/25/13 1525  Addendum: Discussed with urology. They will see her in consultation here. Hospitalist feels comfortable admitting her here based on the overall picture and a troponin not being that elevated not having chest pain. Will notify Dr. Darrick Meigs.    Fredia Sorrow, MD 07/25/13  1552 

## 2013-07-25 NOTE — ED Notes (Signed)
Having abdomen pain about one week.  Diarrhea, denies vomiting.  Was Friday night and at doctors office this am.  Weakness.

## 2013-07-25 NOTE — Progress Notes (Signed)
Subjective:     Patient ID: Bridget Ball, female   DOB: 10/11/1928, 80 y.o.   MRN: [id number]  HPI Presents today with c/o low back pain. She has been putting a heating pad to her back.  Seen in the ED Friday for lower back pain and constipation and discharge. ? received an enema in the ED with no results.   She had been having diarrhea. Having about 2 stools a day.  Her last BM was like water per her son.  She has not had a formed BM in at least a week.  There has been no fever. Appetite is poor. Son has been trying to force feed her the Ensure. Her mucous membranes today are very dry. She is lethargic in the office.  Has referral to see GI at [facility] 07/31/2013    CBC    Component Value Date/Time   WBC 10.5 07/21/2013 1745   RBC 4.08 07/21/2013 1745   HGB 13.1 07/21/2013 1745   HCT 39.3 07/21/2013 1745   PLT 265 07/21/2013 1745   MCV 96.3 07/21/2013 1745   MCH 32.1 07/21/2013 1745   MCHC 33.3 07/21/2013 1745   RDW 15.0 07/21/2013 1745   LYMPHSABS 0.5* 07/21/2013 1745   MONOABS 0.8 07/21/2013 1745   EOSABS 0.1 07/21/2013 1745   BASOSABS 0.0 07/21/2013 1745    CMP     Component Value Date/Time   NA 137 07/21/2013 1745   K 3.6* 07/21/2013 1745   CL 99 07/21/2013 1745   CO2 26 07/21/2013 1745   GLUCOSE 104* 07/21/2013 1745   BUN 14 07/21/2013 1745   CREATININE 0.82 07/21/2013 1745   CALCIUM 8.9 07/21/2013 1745   PROT 6.2 07/21/2013 1745   ALBUMIN 3.8 07/21/2013 1745   AST 16 07/21/2013 1745   ALT 16 07/21/2013 1745   ALKPHOS 43 07/21/2013 1745   BILITOT 0.6 07/21/2013 1745   GFRNONAA 65* 07/21/2013 1745   GFRAA 75* 07/21/2013 1745   Urinalysis    Component Value Date/Time   COLORURINE YELLOW 07/21/2013 2208   APPEARANCEUR CLEAR 07/21/2013 2208   LABSPEC 1.010 07/21/2013 2208   PHURINE 7.0 07/21/2013 [place] 07/21/2013 2208   HGBUR NEGATIVE 07/21/2013 [place] 07/21/2013 2208   KETONESUR 15* 07/21/2013 2208   PROTEINUR NEGATIVE 07/21/2013 2208   UROBILINOGEN 0.2  07/21/2013 2208   NITRITE NEGATIVE 07/21/2013 2208   LEUKOCYTESUR NEGATIVE 07/21/2013 2208        07/21/2013 CT abdomen/pelvis with CM:  IMPRESSION:  Stable hepatic cysts.  Stool is noted throughout the colon concerning for constipation.  Cholelithiasis without inflammation.  Interval development of severe left hydronephrosis concerning for  ureteropelvic junction stenosis. No obstructing calculus is noted.     Hx of microscopic colitis.  Hx Pancreatico biliary tract disease. Patient has cholelithiasis and mildly dilated CBD with sludge; she also has scant pancreatic calcification and stone in pancreatic duct. She does not appear to have biliary symptoms. She will have to be referred to tertiary center an outpatient basis.  ([facility] this Monday).     05/24/2013 EGD and incomplete colonoscopy:  EGD findings;  Erosive reflux esophagitis with small sliding hiatal hernia.  No evidence of gastritis or peptic ulcer disease.  Random biopsies taken from post bulbar mucosa for routine histology.  Colonoscopy findings;  Incomplete exam limited to flexible sigmoidoscopy.  Patient has tortuous noncompliance sigmoid colon resulting in loop formation.  Mucosa of sigmoid colon and rectum was normal.  Mucosal biopsy  taken from sigmoid colon to rule out microscopic or collagenous colitis.   Notes Recorded by Rogene Houston, MD on 05/29/2013 at 9:28 PM Please call patient with results and also prescription for budesonide She has microscopic colitis. Will treat with budesonide 9 mg daily for 2 weeks 6 mg daily for 2 weeks and 3 mg daily for 2 weeks. Office visit in 4 weeks. Report to PCP        Review of Systems Past Medical History  Diagnosis Date  . COPD (chronic obstructive pulmonary disease)   . Arthritis   . Stroke     TIA four years ago  . Depression   . Cancer     Skin cancer  . Seizures     x 2 last year due to bleeding to the brain.  . Memory difficulties   . Chronic  diarrhea   . Hiatal hernia     Past Surgical History  Procedure Laterality Date  . Appendectomy    . Skin cancer excision    . Tubal ligation    . Colonoscopy N/A 05/24/2013    Procedure: COLONOSCOPY;  Surgeon: Rogene Houston, MD;  [facility];  Service: Endoscopy;  Laterality: N/A;  100  . Esophagogastroduodenoscopy N/A 05/24/2013    Procedure: ESOPHAGOGASTRODUODENOSCOPY (EGD);  Surgeon: Rogene Houston, MD;  [facility];  Service: Endoscopy;  Laterality: N/A;    Allergies  Allergen Reactions  . Penicillins Anaphylaxis  . Dilaudid [Hydromorphone Hcl]     Severe sedation per son    Current Outpatient Prescriptions on File Prior to Visit  Medication Sig Dispense Refill  . budesonide (ENTOCORT EC) 3 MG 24 hr capsule Take 1 capsule (3 mg total) by mouth 2 (two) times daily.  40 capsule  0  . budesonide-formoterol (SYMBICORT) 160-4.5 MCG/ACT inhaler Inhale 2 puffs into the lungs 2 (two) times daily.      . citalopram (CELEXA) 10 MG tablet TAKE 1 TABLET (10 MG TOTAL) BY MOUTH DAILY.  90 tablet  0  . levETIRAcetam (KEPPRA) 250 MG tablet Take 1 tablet (250 mg total) by mouth 2 (two) times daily.  60 tablet  2  . pantoprazole (PROTONIX) 40 MG tablet Take 1 tablet (40 mg total) by mouth daily before breakfast.  30 tablet  5  . thiamine 100 MG tablet Take 1 tablet (100 mg total) by mouth daily.  30 tablet  3  . docusate sodium (COLACE) 100 MG capsule Take one capsule a day unless diarrhea begins  30 capsule  0  . HYDROcodone-acetaminophen (NORCO/VICODIN) 5-325 MG per tablet Take one tablet every 6-8 hours for pain  20 tablet  0  . loperamide (IMODIUM A-D) 2 MG tablet Take 1 tablet (2 mg total) by mouth 2 (two) times daily before a meal.  30 tablet  0  . Melatonin 5 MG TABS Take 5 mg by mouth at bedtime.      . metroNIDAZOLE (FLAGYL) 250 MG tablet Take 1 tablet (250 mg total) by mouth every 8 (eight) hours.  8 tablet  0   No current facility-administered medications on  file prior to visit.        Objective:   Physical Exam There were no vitals filed for this visit.  Filed Vitals:   07/25/13 1153  BP: 90/60  Pulse: 84  Temp: 97.1 F (36.2 C)  Height: 5\' 1"  (1.549 m)  Weight: 102 lb (46.267 kg)   Alert and oriented, lethargic. Skin warm and dry.  Oral mucosa is very dry   . Sclera anicteric, conjunctivae is pink. Thyroid not enlarged. No cervical lymphadenopathy. Lungs clear. Heart regular rate and rhythm.  Abdomen is soft. Bowel sounds are positive. No hepatomegaly. No abdominal masses felt. No tenderness.  No edema to lower extremities.        Assessment:     Patient appears dehydrated. Mucous membranes are very dry. She is lethargic in the office.     Plan:     I have advised son to take her to the ED.

## 2013-07-25 NOTE — ED Notes (Signed)
Pt states that over the past few days she has had pain in her middle back area.

## 2013-07-25 NOTE — ED Notes (Signed)
Dr Zackowski at bedside,  

## 2013-07-25 NOTE — ED Notes (Signed)
Paged Dr. Diona Fanti to [phone]

## 2013-07-25 NOTE — H&P (Addendum)
PCP:   Delphina Cahill, MD   Chief Complaint:  Diarrhea  HPI: 80 year old female who    has a past medical history of COPD (chronic obstructive pulmonary disease); Arthritis; Stroke; Depression; Cancer; Seizures; Memory difficulties; Chronic diarrhea; and Hiatal hernia. Was brought to the ED by patient's son for diarrhea, somnolence poor by mouth intake. As per patient's son patient was seen on 07/21/2013 in th ED at that time patient complained of abdominal pain. CT scan abdomen was performed and patient was given 2 doses of Dilaudid and discharged home. Over the past 2 days patient has become very somnolent had very poor by mouth intake and continued to have intermittent diarrhea. Patient was seen by GI in the outpatient clinic, and they recommended evaluation by the ED for dehydration. Patient continues to have lower abdominal pain,  denies chest pain or shortness of breath. Did not have vomiting. In the ED lab work revealed dehydration with creatinine 1.70, also patient had elevated troponin 0.48. CT abdomen pelvis again revealed hydronephrosis as seen on 17th July.  Allergies:   Allergies  Allergen Reactions  . Penicillins Anaphylaxis  . Dilaudid [Hydromorphone Hcl]     Severe sedation per son      Past Medical History  Diagnosis Date  . COPD (chronic obstructive pulmonary disease)   . Arthritis   . Stroke     TIA four years ago  . Depression   . Cancer     Skin cancer  . Seizures     x 2 last year due to bleeding to the brain.  . Memory difficulties   . Chronic diarrhea   . Hiatal hernia     Past Surgical History  Procedure Laterality Date  . Appendectomy    . Skin cancer excision    . Tubal ligation    . Colonoscopy N/A 05/24/2013    Procedure: COLONOSCOPY;  Surgeon: Rogene Houston, MD;  Location: [facility];  Service: Endoscopy;  Laterality: N/A;  100  . Esophagogastroduodenoscopy N/A 05/24/2013    Procedure: ESOPHAGOGASTRODUODENOSCOPY (EGD);  Surgeon: Rogene Houston, MD;  Location: [facility];  Service: Endoscopy;  Laterality: N/A;    Prior to Admission medications   Medication Sig Start Date End Date Taking? Authorizing Provider  budesonide (ENTOCORT EC) 3 MG 24 hr capsule Take 1 capsule (3 mg total) by mouth 2 (two) times daily. 07/12/13  Yes Nita Sells, MD  budesonide-formoterol (SYMBICORT) 160-4.5 MCG/ACT inhaler Inhale 2 puffs into the lungs 2 (two) times daily.   Yes Historical Provider, MD  citalopram (CELEXA) 10 MG tablet TAKE 1 TABLET (10 MG TOTAL) BY MOUTH DAILY. 07/24/13  Yes Antony Contras, MD  docusate sodium (COLACE) 100 MG capsule Take one capsule a day unless diarrhea begins 07/21/13  Yes Maudry Diego, MD  HYDROcodone-acetaminophen (NORCO/VICODIN) 5-325 MG per tablet Take one tablet every 6-8 hours for pain 07/21/13  Yes Maudry Diego, MD  LORazepam (ATIVAN) 0.5 MG tablet Take 0.5 mg by mouth 3 (three) times daily.   Yes Historical Provider, MD  Melatonin 5 MG TABS Take 5 mg by mouth at bedtime.   Yes Historical Provider, MD  pantoprazole (PROTONIX) 40 MG tablet Take 1 tablet (40 mg total) by mouth daily before breakfast. 05/24/13  Yes Rogene Houston, MD    Social History:  reports that she quit smoking about 15 years ago. She does not have any smokeless tobacco history on file. She reports that she drinks about 4.2 ounces of  alcohol per week. She reports that she does not use illicit drugs.  Family History  Problem Relation Age of Onset  . Diabetes Mother      All the positives are listed in BOLD  Review of Systems:  HEENT: Headache, blurred vision, runny nose, sore throat Neck: Hypothyroidism, hyperthyroidism,,lymphadenopathy Chest : Shortness of breath, history of COPD, Asthma Heart : Chest pain, history of coronary arterey disease GI:  Nausea, vomiting, diarrhea, constipation, GERD GU: Dysuria, urgency, frequency of urination, hematuria Neuro: Stroke, seizures, syncope Psych: Depression, anxiety,  hallucinations   Physical Exam: Blood pressure 114/72, pulse 108, temperature 99.2 F (37.3 C), temperature source Oral, resp. rate 16, height 5\' 2"  (1.575 m), weight 46.409 kg (102 lb 5 oz), SpO2 95.00%. Constitutional:   Patient is a malnourished appearing female* in no acute distress and cooperative with exam. Head: Normocephalic and atraumatic Mouth: Mucus membranes moist Eyes: PERRL, EOMI, conjunctivae normal Neck: Supple, No Thyromegaly Cardiovascular: RRR, S1 normal, S2 normal Pulmonary/Chest: CTAB, no wheezes, rales, or rhonchi Abdominal: Soft. Mild diffuse abdominal tenderness to palpation no rigidity no guarding., non-distended, bowel sounds are normal, no masses, organomegaly, or guarding present.  Neurological: A&O x3, Strenght is normal and symmetric bilaterally, cranial nerve II-XII are grossly intact, no focal motor deficit, sensory intact to light touch bilaterally.  Extremities : No Cyanosis, Clubbing or Edema  Labs on Admission:  Basic Metabolic Panel:  Recent Labs Lab 07/21/13 1745 07/25/13 1225  NA 137 135*  K 3.6* 3.5*  CL 99 96  CO2 26 27  GLUCOSE 104* 106*  BUN 14 38*  CREATININE 0.82 1.70*  CALCIUM 8.9 8.2*   Liver Function Tests:  Recent Labs Lab 07/21/13 1745 07/25/13 1225  AST 16 79*  ALT 16 62*  ALKPHOS 43 63  BILITOT 0.6 0.4  PROT 6.2 5.9*  ALBUMIN 3.8 2.6*    Recent Labs Lab 07/25/13 1225  LIPASE 12   No results found for this basename: AMMONIA,  in the last 168 hours CBC:  Recent Labs Lab 07/21/13 1745 07/25/13 1225  WBC 10.5 13.9*  NEUTROABS 9.1* 13.3*  HGB 13.1 12.3  HCT 39.3 35.9*  MCV 96.3 94.2  PLT 265 88*   Cardiac Enzymes:  Recent Labs Lab 07/25/13 1225  TROPONINI 0.48*     No results found for this basename: GLUCAP,  in the last 168 hours  Radiological Exams on Admission: Ct Abdomen Pelvis Wo Contrast  07/25/2013   CLINICAL DATA:  Nausea, vomiting and diarrhea.  EXAM: CT ABDOMEN AND PELVIS WITHOUT  CONTRAST  TECHNIQUE: Multidetector CT imaging of the abdomen and pelvis was performed following the standard protocol without IV contrast.  COMPARISON:  07/21/2013.  FINDINGS: The lung bases are stable.  Patchy bibasilar atelectasis.  The liver is grossly normal and stable. Small cysts are again demonstrated. Cholelithiasis is noted. The spleen is normal in size. No focal lesions. The pancreas is grossly normal.  Severe left-sided hydronephrosis due to a chronic UPJ obstruction. No obstructing ureteral calculus. There is persistent contrast in the left kidney from the recent CT scan. The right kidney is normal.  The bladder is unremarkable. Uterus and ovaries are unremarkable and stable. No mesenteric or retroperitoneal mass or adenopathy. Stable scattered lymph nodes. Stable advanced atherosclerotic calcifications involving the aorta and branch vessels.  No pelvic mass or adenopathy.  No inguinal mass or adenopathy.  The bony structures are stable.  IMPRESSION: Stable left-sided UPJ obstruction with severe hydronephrosis.  Chololithiasis.  Bibasilar atelectasis.  Stable hepatic  cysts.   Electronically Signed   By: Kalman Jewels M.D.   On: 07/25/2013 14:20   Dg Chest 2 View  07/25/2013   CLINICAL DATA:  Severe abdominal pain  EXAM: CHEST  2 VIEW  COMPARISON:  None.  FINDINGS: The lungs are hyperinflated likely secondary to COPD. There is no focal parenchymal opacity, pleural effusion, or pneumothorax. The heart and mediastinal contours are unremarkable. There is thoracic aortic atherosclerosis.  The osseous structures are unremarkable.  IMPRESSION: No active cardiopulmonary disease.   Electronically Signed   By: Kathreen Devoid   On: 07/25/2013 13:51    EKG: Independently reviewed. *Sinus tachycardia  Assessment/Plan Principal Problem:   AKI (acute kidney injury) Active Problems:   Diarrhea   Abdominal pain, unspecified site   Hydronephrosis, left   Dehydration   Elevated troponin  Acute kidney  injury Patient is dehydrated secondary to diarrhea and poor by mouth intake, will admit the patient and start IV normal saline. Will follow BMP in a.m.  Elevated troponin  Patient does not have history of CAD, cardiology was called by ED physician and they recommended IV fluids and serial troponin as it appears most likely demand ischemia from sinus tachycardia and dehydration. Will follow serial troponin and consult cardiology if troponin elevated. We'll start the patient on aspirin 325 mg by mouth daily.  Called and discussed with Dr Stanford Breed , cardiology on call, who recommends to continue to monitor the serial troponins, as patient had no chest pain, it appears likely due to renal insufficiency. Will consult cardiology in am.  Diarrhea/abdominal pain Patient has ongoing diarrhea and abdominal pain since July 4, was seen by GI in the clinic and was sent to the ED for further evaluation for diarrhea. I will obtain stool for C. difficile. IV hydration with normal saline. CT abdomen pelvis did not show colitis.  Hydronephrosis  CT abdomen pelvis revealed stable left sided UPJ obstruction with severe hydronephrosis Urology has been consulted and they will see the patient in the hospital  DVT prophylaxis Lovenox   Code status:Patient is DO NOT RESUSCITATE  Family discussion: Admission, patients condition and plan of care including tests being ordered have been discussed with the patient and her son at bedside* who indicate understanding and agree with the plan and Code Status.   Time Spent on Admission: 65 minutes  [place] Pager: [phone] 07/25/2013, 3:54 PM  If 7PM-7AM, please contact night-coverage  [email or web address]  Password [email or web address]

## 2013-07-25 NOTE — ED Notes (Signed)
CRITICAL VALUE ALERT  Critical value received:  Trop 0.48  Date of notification:  07/25/2013  Time of notification:  13:40  Critical value read back: yes  Nurse who received alert:  Dr Rogene Houston   MD notified (1st page): 13:48  Time of first page:  13:48 MD notified (2nd page):  Time of second page:  Responding MD: Dr Rogene Houston   Time MD responded:  13:48

## 2013-07-25 NOTE — Patient Instructions (Signed)
Advised to go the ED.

## 2013-07-25 NOTE — ED Notes (Signed)
Pt c/o n/v/d that continued over the weekend, pt was seen in er on Friday, follow up with Terri NP (gi) today, was told to come to er for further evaluation, pt reports that she has had problems with her stomach for "awhile",

## 2013-07-26 ENCOUNTER — Inpatient Hospital Stay (HOSPITAL_COMMUNITY): Payer: Medicare Other

## 2013-07-26 DIAGNOSIS — R197 Diarrhea, unspecified: Secondary | ICD-10-CM

## 2013-07-26 DIAGNOSIS — R7989 Other specified abnormal findings of blood chemistry: Secondary | ICD-10-CM

## 2013-07-26 DIAGNOSIS — I369 Nonrheumatic tricuspid valve disorder, unspecified: Secondary | ICD-10-CM

## 2013-07-26 DIAGNOSIS — E876 Hypokalemia: Secondary | ICD-10-CM

## 2013-07-26 DIAGNOSIS — I214 Non-ST elevation (NSTEMI) myocardial infarction: Secondary | ICD-10-CM | POA: Diagnosis present

## 2013-07-26 DIAGNOSIS — R109 Unspecified abdominal pain: Secondary | ICD-10-CM

## 2013-07-26 LAB — BLOOD GAS, ARTERIAL
ACID-BASE DEFICIT: 3.2 mmol/L — AB (ref 0.0–2.0)
Bicarbonate: 22.5 mEq/L (ref 20.0–24.0)
Drawn by: 103701
FIO2: 1 %
O2 Saturation: 98.3 %
PCO2 ART: 52.2 mmHg — AB (ref 35.0–45.0)
PO2 ART: 141 mmHg — AB (ref 80.0–100.0)
Patient temperature: 103.8
TCO2: 20.6 mmol/L (ref 0–100)
pH, Arterial: 7.275 — ABNORMAL LOW (ref 7.350–7.450)

## 2013-07-26 LAB — COMPREHENSIVE METABOLIC PANEL
ALK PHOS: 76 U/L (ref 39–117)
ALT: 48 U/L — ABNORMAL HIGH (ref 0–35)
AST: 40 U/L — AB (ref 0–37)
Albumin: 2.2 g/dL — ABNORMAL LOW (ref 3.5–5.2)
Anion gap: 11 (ref 5–15)
BILIRUBIN TOTAL: 0.3 mg/dL (ref 0.3–1.2)
BUN: 31 mg/dL — ABNORMAL HIGH (ref 6–23)
CHLORIDE: 100 meq/L (ref 96–112)
CO2: 26 meq/L (ref 19–32)
Calcium: 7.8 mg/dL — ABNORMAL LOW (ref 8.4–10.5)
Creatinine, Ser: 1.36 mg/dL — ABNORMAL HIGH (ref 0.50–1.10)
GFR calc Af Amer: 41 mL/min — ABNORMAL LOW (ref >90)
GFR, EST NON AFRICAN AMERICAN: 35 mL/min — AB (ref >90)
Glucose, Bld: 91 mg/dL (ref 70–99)
POTASSIUM: 3.1 meq/L — AB (ref 3.7–5.3)
SODIUM: 137 meq/L (ref 137–147)
Total Protein: 5.3 g/dL — ABNORMAL LOW (ref 6.0–8.3)

## 2013-07-26 LAB — BASIC METABOLIC PANEL
ANION GAP: 14 (ref 5–15)
BUN: 30 mg/dL — ABNORMAL HIGH (ref 6–23)
CALCIUM: 7.7 mg/dL — AB (ref 8.4–10.5)
CO2: 22 mEq/L (ref 19–32)
CREATININE: 1.33 mg/dL — AB (ref 0.50–1.10)
Chloride: 102 mEq/L (ref 96–112)
GFR calc Af Amer: 42 mL/min — ABNORMAL LOW (ref >90)
GFR calc non Af Amer: 36 mL/min — ABNORMAL LOW (ref >90)
Glucose, Bld: 85 mg/dL (ref 70–99)
Potassium: 2.9 mEq/L — CL (ref 3.7–5.3)
Sodium: 138 mEq/L (ref 137–147)

## 2013-07-26 LAB — CBC
HCT: 32.6 % — ABNORMAL LOW (ref 36.0–46.0)
HCT: 32.4 % — ABNORMAL LOW (ref 36.0–46.0)
Hemoglobin: 10.9 g/dL — ABNORMAL LOW (ref 12.0–15.0)
Hemoglobin: 10.8 g/dL — ABNORMAL LOW (ref 12.0–15.0)
MCH: 31.8 pg (ref 26.0–34.0)
MCH: 31.9 pg (ref 26.0–34.0)
MCHC: 33.4 g/dL (ref 30.0–36.0)
MCHC: 33.3 g/dL (ref 30.0–36.0)
MCV: 95 fL (ref 78.0–100.0)
MCV: 95.6 fL (ref 78.0–100.0)
PLATELETS: 80 10*3/uL — AB (ref 150–400)
PLATELETS: DECREASED 10*3/uL (ref 150–400)
RBC: 3.43 MIL/uL — AB (ref 3.87–5.11)
RBC: 3.39 MIL/uL — ABNORMAL LOW (ref 3.87–5.11)
RDW: 15.3 % (ref 11.5–15.5)
RDW: 15.3 % (ref 11.5–15.5)
WBC: 13.3 10*3/uL — AB (ref 4.0–10.5)
WBC: 3.1 10*3/uL — ABNORMAL LOW (ref 4.0–10.5)

## 2013-07-26 LAB — TROPONIN I
TROPONIN I: 0.75 ng/mL — AB (ref <0.30)
Troponin I: <0.3 ng/mL (ref <0.30)

## 2013-07-26 LAB — LACTIC ACID, PLASMA: Lactic Acid, Venous: 2.3 mmol/L — ABNORMAL HIGH (ref 0.5–2.2)

## 2013-07-26 LAB — CK TOTAL AND CKMB (NOT AT ARMC)
CK, MB: 3 ng/mL (ref 0.3–4.0)
Relative Index: INVALID (ref 0.0–2.5)
Total CK: 63 U/L (ref 7–177)

## 2013-07-26 MED ORDER — METRONIDAZOLE IN NACL 5-0.79 MG/ML-% IV SOLN: 500.0000 mg | Freq: Three times a day (TID) | INTRAVENOUS | Status: DC

## 2013-07-26 MED ORDER — POTASSIUM CHLORIDE CRYS ER 20 MEQ PO TBCR
40.0000 meq | EXTENDED_RELEASE_TABLET | Freq: Two times a day (BID) | ORAL | Status: DC
  Administered 2013-07-26: 40 meq via ORAL
  Filled 2013-07-26 (×2): qty 2

## 2013-07-26 MED ORDER — DILTIAZEM HCL 100 MG IV SOLR
5.0000 mg/h | INTRAVENOUS | Status: DC
  Filled 2013-07-26: qty 100

## 2013-07-26 MED ORDER — METOPROLOL TARTRATE 1 MG/ML IV SOLN: 2.5000 mg | Freq: Once | INTRAVENOUS | Status: DC

## 2013-07-26 MED ORDER — DILTIAZEM HCL 25 MG/5ML IV SOLN
10.0000 mg | INTRAVENOUS | Status: DC
  Filled 2013-07-26: qty 5

## 2013-07-26 MED ORDER — SODIUM CHLORIDE 0.9 % IV BOLUS (SEPSIS): 250.0000 mL | Freq: Once | INTRAVENOUS | Status: DC

## 2013-07-26 MED ORDER — MAGNESIUM SULFATE 40 MG/ML IJ SOLN
2.0000 g | Freq: Once | INTRAMUSCULAR | Status: AC
  Administered 2013-07-27: 2 g via INTRAVENOUS
  Filled 2013-07-26: qty 50

## 2013-07-26 MED ORDER — BUDESONIDE 0.25 MG/2ML IN SUSP
0.5000 mg | Freq: Two times a day (BID) | RESPIRATORY_TRACT | Status: DC
  Administered 2013-07-26: 0.5 mg via RESPIRATORY_TRACT
  Administered 2013-07-27: 0.25 mg via RESPIRATORY_TRACT
  Administered 2013-07-27 – 2013-07-28 (×3): 0.5 mg via RESPIRATORY_TRACT
  Filled 2013-07-26 (×6): qty 4

## 2013-07-26 MED ORDER — SODIUM CHLORIDE 0.9 % IV BOLUS (SEPSIS)
250.0000 mL | Freq: Once | INTRAVENOUS | Status: AC
  Administered 2013-07-26: 250 mL via INTRAVENOUS

## 2013-07-26 MED ORDER — DIGOXIN 0.25 MG/ML IJ SOLN
0.2500 mg | Freq: Every day | INTRAMUSCULAR | Status: DC
Start: 2013-07-26 — End: 2013-07-27
  Administered 2013-07-26: 0.25 mg via INTRAVENOUS
  Filled 2013-07-26 (×3): qty 1

## 2013-07-26 MED ORDER — IOHEXOL 300 MG/ML  SOLN: 20.0000 mL | Freq: Once | INTRAMUSCULAR | Status: AC | PRN

## 2013-07-26 MED ORDER — LEVOFLOXACIN IN D5W 750 MG/150ML IV SOLN
750.0000 mg | INTRAVENOUS | Status: DC
  Filled 2013-07-26: qty 150

## 2013-07-26 MED ORDER — ACETAMINOPHEN 10 MG/ML IV SOLN
1000.0000 mg | Freq: Four times a day (QID) | INTRAVENOUS | Status: DC
  Administered 2013-07-26: 1000 mg via INTRAVENOUS
  Filled 2013-07-26 (×3): qty 100

## 2013-07-26 MED ORDER — LIDOCAINE HCL 1 % IJ SOLN
INTRAMUSCULAR | Status: AC
  Filled 2013-07-26: qty 20

## 2013-07-26 MED ORDER — METRONIDAZOLE IN NACL 5-0.79 MG/ML-% IV SOLN: 500.0000 mg | Freq: Once | INTRAVENOUS | Status: DC

## 2013-07-26 MED ORDER — VANCOMYCIN HCL 500 MG IV SOLR
500.0000 mg | INTRAVENOUS | Status: DC
  Administered 2013-07-26: 500 mg via INTRAVENOUS
  Filled 2013-07-26 (×2): qty 500

## 2013-07-26 MED ORDER — ASPIRIN EC 81 MG PO TBEC
81.0000 mg | DELAYED_RELEASE_TABLET | Freq: Every day | ORAL | Status: DC
  Administered 2013-07-26 – 2013-07-28 (×2): 81 mg via ORAL
  Filled 2013-07-26 (×2): qty 1

## 2013-07-26 MED ORDER — MORPHINE SULFATE 2 MG/ML IJ SOLN
1.0000 mg | Freq: Once | INTRAMUSCULAR | Status: AC
  Administered 2013-07-26: 1 mg via INTRAVENOUS

## 2013-07-26 MED ORDER — MIDAZOLAM HCL 2 MG/2ML IJ SOLN
INTRAMUSCULAR | Status: AC
  Administered 2013-07-26: 0.5 mg
  Filled 2013-07-26: qty 2

## 2013-07-26 MED ORDER — PHENYLEPHRINE HCL 10 MG/ML IJ SOLN
30.0000 ug/min | INTRAVENOUS | Status: DC
  Administered 2013-07-26: 30 ug/min via INTRAVENOUS
  Administered 2013-07-27: 140 ug/min via INTRAVENOUS
  Filled 2013-07-26 (×3): qty 1

## 2013-07-26 MED ORDER — ACETAMINOPHEN 325 MG PO TABS
650.0000 mg | ORAL_TABLET | Freq: Four times a day (QID) | ORAL | Status: DC | PRN
  Administered 2013-07-30 – 2013-08-02 (×3): 650 mg via ORAL
  Filled 2013-07-26 (×4): qty 2

## 2013-07-26 MED ORDER — ACETAMINOPHEN 650 MG RE SUPP
650.0000 mg | Freq: Four times a day (QID) | RECTAL | Status: DC | PRN
  Administered 2013-07-27 (×2): 650 mg via RECTAL
  Filled 2013-07-26 (×2): qty 1

## 2013-07-26 MED ORDER — SODIUM CHLORIDE 0.9 % IV BOLUS (SEPSIS)
500.0000 mL | Freq: Once | INTRAVENOUS | Status: AC
  Administered 2013-07-26: 500 mL via INTRAVENOUS

## 2013-07-26 MED ORDER — LEVALBUTEROL HCL 1.25 MG/0.5ML IN NEBU
1.2500 mg | INHALATION_SOLUTION | Freq: Four times a day (QID) | RESPIRATORY_TRACT | Status: DC | PRN
  Administered 2013-07-26: 1.25 mg via RESPIRATORY_TRACT
  Filled 2013-07-26: qty 0.5

## 2013-07-26 MED ORDER — DEXTROSE 5 % IV SOLN
2.0000 g | INTRAVENOUS | Status: AC
  Administered 2013-07-26: 2 g via INTRAVENOUS
  Filled 2013-07-26: qty 2

## 2013-07-26 MED ORDER — MORPHINE SULFATE 2 MG/ML IJ SOLN
INTRAMUSCULAR | Status: AC
  Filled 2013-07-26: qty 1

## 2013-07-26 MED ORDER — FENTANYL CITRATE 0.05 MG/ML IJ SOLN
INTRAMUSCULAR | Status: AC
  Administered 2013-07-26: 25 ug
  Filled 2013-07-26: qty 2

## 2013-07-26 MED ORDER — POTASSIUM CHLORIDE 10 MEQ/100ML IV SOLN
10.0000 meq | INTRAVENOUS | Status: AC
  Administered 2013-07-26 – 2013-07-27 (×3): 10 meq via INTRAVENOUS
  Filled 2013-07-26 (×2): qty 100

## 2013-07-26 MED ORDER — ATORVASTATIN CALCIUM 80 MG PO TABS
80.0000 mg | ORAL_TABLET | Freq: Every day | ORAL | Status: DC
  Administered 2013-07-26 – 2013-08-02 (×7): 80 mg via ORAL
  Filled 2013-07-26: qty 1
  Filled 2013-07-26: qty 2
  Filled 2013-07-26: qty 1
  Filled 2013-07-26: qty 2
  Filled 2013-07-26 (×2): qty 1
  Filled 2013-07-26: qty 2
  Filled 2013-07-26: qty 1

## 2013-07-26 MED ORDER — METOPROLOL TARTRATE 1 MG/ML IV SOLN
INTRAVENOUS | Status: AC
  Filled 2013-07-26: qty 5

## 2013-07-26 MED ORDER — SODIUM CHLORIDE 0.9 % IV SOLN
INTRAVENOUS | Status: DC
  Administered 2013-07-26 – 2013-07-27 (×3): via INTRAVENOUS

## 2013-07-26 MED ORDER — FUROSEMIDE 10 MG/ML IJ SOLN
INTRAMUSCULAR | Status: AC
  Administered 2013-07-26: 40 mg
  Filled 2013-07-26: qty 4

## 2013-07-26 MED ORDER — DEXTROSE 5 % IV SOLN
1.0000 g | Freq: Three times a day (TID) | INTRAVENOUS | Status: DC
  Administered 2013-07-27 – 2013-07-29 (×8): 1 g via INTRAVENOUS
  Filled 2013-07-26 (×12): qty 1

## 2013-07-26 MED ORDER — ARFORMOTEROL TARTRATE 15 MCG/2ML IN NEBU
15.0000 ug | INHALATION_SOLUTION | Freq: Two times a day (BID) | RESPIRATORY_TRACT | Status: DC
  Administered 2013-07-26 – 2013-08-02 (×15): 15 ug via RESPIRATORY_TRACT
  Filled 2013-07-26 (×18): qty 2

## 2013-07-26 NOTE — Progress Notes (Signed)
Patient ID: Bridget Ball, female   DOB: Jun 07, 1928, 80 y.o.   MRN: [id number]  I was called by Dr. Sarajane Jews about patient this morning.  She has been seen by Dr. Diona Fanti for left hydronephrosis apparently related to a left UPJ obstruction.  Her Cr is 1.3 (baseline 1.7).  Dr. Sarajane Jews called to ask if patient needed to be transferred to [facility] for urologic intervention.  I talked with Dr. Diona Fanti this morning who does recommend transfer to [facility] today or tomorrow and he will consider cystoscopy and ureteral stent placement on Friday.

## 2013-07-26 NOTE — Progress Notes (Signed)
Rt called to rapid response room 1431. ABG obtained pt was on 100% NRB when arrived. MD and RN at bedside. Pt transferred to ICU room 1239 for further work up. Rt will initiate orders as ordered.

## 2013-07-26 NOTE — Progress Notes (Signed)
Patient transferred from [facility] with hydronephrosis and worsening renal function. Course subsequently complicated with urosepsis. Now hypotensive, febrile (up to 104) and tachycardic (HR has remained in the 120-130's). Case discussed with Dr. Alinda Money and given decompensation plan is for nephrostomy tube placement by IR tonight; hoping this will help stabilizing situation. Critical care has also evaluated patient and planning to assist with neo-epinephrine pressors to stabilize BP further. Will continue monitoring.  Plan: -nephrostomy tube by IR -start neo vasopressor -replete potassium -continue broad spectrum antibiotics -appreciate urology and PCCM assistance and recommendations -follow clinical response   Barton Dubois [phone]

## 2013-07-26 NOTE — Progress Notes (Addendum)
Patient ID: Bridget Ball, female   DOB: March 09, 1928, 80 y.o.   MRN: [id number]    Subjective: Pt is an 80 year old female who was seen by Dr. Diona Fanti in consultation at [facility] yesterday for left hydronephrosis and an elevated Cr of 1.7 compared to baseline of 0.7.  She also complained of some non specific back pain and left abdominal pain.  It was felt that her hydronephrosis was likely related to a chronic left UPJ obstruction but her symptoms may be improved with ureteral stent placement.  She was transferred to [facility] today. Unfortunately, she has acutely decompensated today. She has developed fever to 104, hypotension, and tachycardia consistent with sepsis.  Her UA from yesterday appeared infected and her urinary tract appears to be the most likely source for her sepsis.   Objective: Vital signs in last 24 hours: Temp:  [97.7 F (36.5 C)-104.4 F (40.2 C)] 102.5 F (39.2 C) (07/22 1820) Pulse Rate:  [73-163] 147 (07/22 1820) Resp:  [18-30] 26 (07/22 1820) BP: (78-158)/(43-86) 91/43 mmHg (07/22 1820) SpO2:  [85 %-100 %] 94 % (07/22 1820) Weight:  [46.3 kg (102 lb 1.2 oz)] 46.3 kg (102 lb 1.2 oz) (07/22 0500)  Intake/Output from previous day: 07/21 0701 - 07/22 0700 In: 1036.7 [I.V.:1036.7] Out: 300 [Urine:300] Intake/Output this shift:    Physical Exam:  General: Alert and oriented CV: Regular, tachycardic Abdomen: Soft, ND, TTP over left upper quadrant Back: Mild L CVAT Ext: NT, No erythema  Lab Results:  Recent Labs  07/25/13 1225 07/26/13 0505  HGB 12.3 10.9*  HCT 35.9* 32.6*   BMET  Recent Labs  07/25/13 1225 07/26/13 0505  NA 135* 137  K 3.5* 3.1*  CL 96 100  CO2 27 26  GLUCOSE 106* 91  BUN 38* 31*  CREATININE 1.70* 1.36*  CALCIUM 8.2* 7.8*     Studies/Results: Ct Abdomen Pelvis Wo Contrast  07/25/2013   CLINICAL DATA:  Nausea, vomiting and diarrhea.  EXAM: CT ABDOMEN AND PELVIS WITHOUT CONTRAST  TECHNIQUE: Multidetector CT imaging of the abdomen  and pelvis was performed following the standard protocol without IV contrast.  COMPARISON:  07/21/2013.  FINDINGS: The lung bases are stable.  Patchy bibasilar atelectasis.  The liver is grossly normal and stable. Small cysts are again demonstrated. Cholelithiasis is noted. The spleen is normal in size. No focal lesions. The pancreas is grossly normal.  Severe left-sided hydronephrosis due to a chronic UPJ obstruction. No obstructing ureteral calculus. There is persistent contrast in the left kidney from the recent CT scan. The right kidney is normal.  The bladder is unremarkable. Uterus and ovaries are unremarkable and stable. No mesenteric or retroperitoneal mass or adenopathy. Stable scattered lymph nodes. Stable advanced atherosclerotic calcifications involving the aorta and branch vessels.  No pelvic mass or adenopathy.  No inguinal mass or adenopathy.  The bony structures are stable.  IMPRESSION: Stable left-sided UPJ obstruction with severe hydronephrosis.  Chololithiasis.  Bibasilar atelectasis.  Stable hepatic cysts.   Electronically Signed   By: Kalman Jewels M.D.   On: 07/25/2013 14:20   Dg Chest 2 View  07/25/2013   CLINICAL DATA:  Severe abdominal pain  EXAM: CHEST  2 VIEW  COMPARISON:  None.  FINDINGS: The lungs are hyperinflated likely secondary to COPD. There is no focal parenchymal opacity, pleural effusion, or pneumothorax. The heart and mediastinal contours are unremarkable. There is thoracic aortic atherosclerosis.  The osseous structures are unremarkable.  IMPRESSION: No active cardiopulmonary disease.  Electronically Signed   By: Kathreen Devoid   On: 07/25/2013 13:51   Dg Abd 1 View  07/26/2013   CLINICAL DATA:  Abdominal distention.  EXAM: ABDOMEN - 1 VIEW  COMPARISON:  None.  FINDINGS: Supine abdomen shows gas scattered along a nondilated colon. There is no gaseous small bowel dilatation. Probe overlies the lower midline pelvis. Bones are demineralized with degenerative changes noted  in the lower lumbar spine.  IMPRESSION: No evidence for bowel obstruction although there is diffuse gaseous filling of the colon and a component of underlying mild colonic ileus would be a consideration.   Electronically Signed   By: Misty Stanley M.D.   On: 07/26/2013 19:06   Dg Chest Port 1 View  07/26/2013   CLINICAL DATA:  Shortness of breath with chest pain and tachycardia. Fever. History of COPD and seizures.  EXAM: PORTABLE CHEST - 1 VIEW  COMPARISON:  Radiographs 07/25/2013 and 07/08/2013.  FINDINGS: 1751 hr. The heart size and mediastinal contours are stable. Accentuated interstitial markings compared with the prior studies may be related to portable technique, although mild superimposed edema is difficult to exclude. There is no airspace disease, pleural effusion or pneumothorax. The osseous structures appear unchanged.  IMPRESSION: Mild accentuation of interstitial markings, likely technical. No definite acute findings.   Electronically Signed   By: Camie Patience M.D.   On: 07/26/2013 18:06    Assessment/Plan: 1) Sepsis/UTI with left ureteral obstruction: Blood cultures and urine cultures pending. Continue broad spectrum IV antibiotics pending culture results. She requires drainage of her left kidney. Will proceed with left nephrostomy tube placement tonight.  I have spoken with family and feel that this would be safest course of action considering her tenuous clinical situation.  Her family agrees and consents to proceed. I have spoken with Dr. Kathlene Cote and he plans to proceed tonight.   LOS: 1 day   Rafik Koppel,LES 07/26/2013, 8:21 PM

## 2013-07-26 NOTE — Progress Notes (Signed)
INITIAL NUTRITION ASSESSMENT  DOCUMENTATION CODES Per approved criteria  -Non-severe (moderate) malnutrition in the context of chronic illness   INTERVENTION: Ensure Complete po BID, each supplement provides 350 kcal and 13 grams of protein   ProStat 30 ml BID (each 30 ml provides 100 kcal, 15 gr protein)   NUTRITION DIAGNOSIS: Inadequate oral intake related to abdominal pain, diarrhea; ongoing.   Goal: Pt to meet >/= 90% of their estimated nutrition needs     Monitor: diet advancement, po intake, labs and wt trends    Reason for Assessment: Malnutrition Screen Score =  3  80 y.o. female   ASSESSMENT: Pt is very pleasant 80 yo who presents with abdominal pain nad hx of chronic diarrhea. Abdominal CT-findings 7/6-resolving colitis with inflammatory changes still in the mesentery.  Pt has hx of significant weight loss January-April this year-11%. She has maintained her current weight since April. Pt c/o of poor tolerance of oral intake since January. She has muscle wasting to clavicles, quadriceps, patella and fat loss to arms. Pt says she's had diarrhea for so long that she has become "afraid" to eat.  Pt assessed by RD 07/10/13 related to MST score. She has been re-admitted with c/o diarrhea, dehydration, continued poor po intake.  She has additional weight loss of 3#, 3% in 2 weeks and worsening malnutrition secondary to inadequate oral intake.  Urology consult for blocked left kidney. Plans are for her to be transferred to [facility] when she is stable.   Height: Ht Readings from Last 1 Encounters:  07/25/13 5\' 2"  (1.575 m)    Weight: Wt Readings from Last 1 Encounters:  07/26/13 102 lb 1.2 oz (46.3 kg)    Ideal Body Weight: 105#  % Ideal Body Weight: 100%  Wt Readings from Last 10 Encounters:  07/26/13 102 lb 1.2 oz (46.3 kg)  07/25/13 102 lb (46.267 kg)  07/11/13 108 lb 1.6 oz (49.034 kg)  06/27/13 104 lb 14.4 oz (47.582 kg)  05/24/13 106 lb (48.081 kg)   05/24/13 106 lb (48.081 kg)  04/25/13 106 lb (48.081 kg)  04/20/13 105 lb 8 oz (47.854 kg)  01/24/13 118 lb (53.524 kg)  11/20/12 116 lb 10 oz (52.9 kg)    Usual Body Weight: 115-120#  % Usual Body Weight: 9%  BMI:  Body mass index is 18.66 kg/(m^2). normal range  Estimated Nutritional Needs: Kcal: 1200-1400 Protein: 56-66 gr Fluid:1.2-1.4 liters daily   Skin: intact  Diet Order: Cardiac  EDUCATION NEEDS: -No education needs identified at this time   Intake/Output Summary (Last 24 hours) at 07/26/13 0847 Last data filed at 07/26/13 0300  Gross per 24 hour  Intake    900 ml  Output    300 ml  Net    600 ml    Last BM: 07/09/13 diarrhea   Labs:   Recent Labs Lab 07/21/13 1745 07/25/13 1225 07/26/13 0505  NA 137 135* 137  K 3.6* 3.5* 3.1*  CL 99 96 100  CO2 26 27 26   BUN 14 38* 31*  CREATININE 0.82 1.70* 1.36*  CALCIUM 8.9 8.2* 7.8*  GLUCOSE 104* 106* 91    CBG (last 3)  No results found for this basename: GLUCAP,  in the last 72 hours  Scheduled Meds: . antiseptic oral rinse  15 mL Mouth Rinse BID  . aspirin EC  325 mg Oral Daily  . budesonide-formoterol  2 puff Inhalation BID  . citalopram  10 mg Oral Daily  . LORazepam  0.5  mg Oral TID  . pantoprazole  40 mg Oral QAC breakfast  . sodium chloride  3 mL Intravenous Q12H    Continuous Infusions: . sodium chloride 1,000 mL (07/26/13 0422)    Past Medical History  Diagnosis Date  . COPD (chronic obstructive pulmonary disease)   . Arthritis   . Stroke     TIA four years ago  . Depression   . Seizures     x 2 last year due to bleeding to the brain.  . Memory difficulties   . Chronic diarrhea   . Hiatal hernia   . Cancer     Skin cancer    Past Surgical History  Procedure Laterality Date  . Appendectomy    . Skin cancer excision    . Tubal ligation    . Colonoscopy N/A 05/24/2013    Procedure: COLONOSCOPY;  Surgeon: Rogene Houston, MD;  Location: [facility];  Service: Endoscopy;   Laterality: N/A;  100  . Esophagogastroduodenoscopy N/A 05/24/2013    Procedure: ESOPHAGOGASTRODUODENOSCOPY (EGD);  Surgeon: Rogene Houston, MD;  Location: [facility];  Service: Endoscopy;  Laterality: N/A;    Colman Cater MS,RD,CSG,LDN Office: [phone] Pager: [phone]

## 2013-07-26 NOTE — Progress Notes (Addendum)
Patient transferred from [facility] ICU to me at 1500.  Patient was in NAD at time of transfer, patient was in good spirits.   Patient had a large amount of urine, incontinent at time of arrival.  Patient's abdomen was noted to be somewhat distended as well.  Patient did attempt to use the bedpan to urinate but was unable to.  Called [facility] at this time to notify Dr. Diona Fanti of patient's arrival.  Approximately an hour later, patient voided about 50cc urine which was sent down for culture.  Approx 30-45 minutes later, patient's family called the nurse stating that she was in excruciating pain.  Patient appeared to be in severe distress, with trouble breathing.  Abdomen appeared more distended.  She was complaining of mid-lower back pain.  Patient's HR shot up to 160s sustained.  Patient O2 sats were dropping.  Patient had a 104.4 temperature rectally.  She was placed on a nonrebreather.  Patient was not improving.  Patient had no orders for pain medicine.  Dr. Tyrell Antonio and Rapid Response RN were called.  Patient had another incontinent episode of urine at time of transfer. Lasix, IV tylenol, and morphine were administered to the patient prior to transfer to ICU.

## 2013-07-26 NOTE — Progress Notes (Signed)
Subjective: Bridget Ball was transferred to this hospital today. She recently had an episode of tachycardia and hypotension as well as tachypnea. She has been moved from the telemetry unit to the ICU. She is arousable, but not totally alert. She is still tachypnea. She recently complained of back pain.   Objective: Vital signs in last 24 hours: Temp:  [97.7 F (36.5 C)-104.4 F (40.2 C)] 102.3 F (39.1 C) (07/22 1756) Pulse Rate:  [73-163] 156 (07/22 1723) Resp:  [18-30] 26 (07/22 1712) BP: (78-158)/(46-86) 137/72 mmHg (07/22 1723) SpO2:  [85 %-100 %] 100 % (07/22 1718) Weight:  [46.3 kg (102 lb 1.2 oz)] 46.3 kg (102 lb 1.2 oz) (07/22 0500)  Intake/Output from previous day: 07/21 0701 - 07/22 0700 In: 1036.7 [I.V.:1036.7] Out: 300 [Urine:300] Intake/Output this shift: Total I/O In: 480 [P.O.:480] Out: -   Physical Exam:  Constitutional: Vital signs reviewed. Elderly cachectic female with tachypnea. Eyes: PERRL, No scleral icterus.   Cardiovascular:  tachycardic  Pulmonary/Chest: tachypnea, with use of accessory muscles present  Abdominal: Soft.  slightly distended and tympanitic. No specific guarding or rebound. There is left greater than right CVA tenderness.    Lab Results:  Recent Labs  07/25/13 1225 07/26/13 0505  HGB 12.3 10.9*  HCT 35.9* 32.6*   BMET  Recent Labs  07/25/13 1225 07/26/13 0505  NA 135* 137  K 3.5* 3.1*  CL 96 100  CO2 27 26  GLUCOSE 106* 91  BUN 38* 31*  CREATININE 1.70* 1.36*  CALCIUM 8.2* 7.8*   No results found for this basename: LABPT, INR,  in the last 72 hours No results found for this basename: LABURIN,  in the last 72 hours Results for orders placed during the hospital encounter of 07/25/13  MRSA PCR SCREENING     Status: None   Collection Time    07/25/13  6:41 PM      Result Value Ref Range Status   MRSA by PCR NEGATIVE  NEGATIVE Final   Comment:            The GeneXpert MRSA Assay (FDA     approved for NASAL specimens      only), is one component of a     comprehensive MRSA colonization     surveillance program. It is not     intended to diagnose MRSA     infection nor to guide or     monitor treatment for     MRSA infections.    Studies/Results: Ct Abdomen Pelvis Wo Contrast  07/25/2013   CLINICAL DATA:  Nausea, vomiting and diarrhea.  EXAM: CT ABDOMEN AND PELVIS WITHOUT CONTRAST  TECHNIQUE: Multidetector CT imaging of the abdomen and pelvis was performed following the standard protocol without IV contrast.  COMPARISON:  07/21/2013.  FINDINGS: The lung bases are stable.  Patchy bibasilar atelectasis.  The liver is grossly normal and stable. Small cysts are again demonstrated. Cholelithiasis is noted. The spleen is normal in size. No focal lesions. The pancreas is grossly normal.  Severe left-sided hydronephrosis due to a chronic UPJ obstruction. No obstructing ureteral calculus. There is persistent contrast in the left kidney from the recent CT scan. The right kidney is normal.  The bladder is unremarkable. Uterus and ovaries are unremarkable and stable. No mesenteric or retroperitoneal mass or adenopathy. Stable scattered lymph nodes. Stable advanced atherosclerotic calcifications involving the aorta and branch vessels.  No pelvic mass or adenopathy.  No inguinal mass or adenopathy.  The bony structures are stable.  IMPRESSION: Stable left-sided UPJ obstruction with severe hydronephrosis.  Chololithiasis.  Bibasilar atelectasis.  Stable hepatic cysts.   Electronically Signed   By: Kalman Jewels M.D.   On: 07/25/2013 14:20   Dg Chest 2 View  07/25/2013   CLINICAL DATA:  Severe abdominal pain  EXAM: CHEST  2 VIEW  COMPARISON:  None.  FINDINGS: The lungs are hyperinflated likely secondary to COPD. There is no focal parenchymal opacity, pleural effusion, or pneumothorax. The heart and mediastinal contours are unremarkable. There is thoracic aortic atherosclerosis.  The osseous structures are unremarkable.   IMPRESSION: No active cardiopulmonary disease.   Electronically Signed   By: Kathreen Devoid   On: 07/25/2013 13:51   Dg Chest Port 1 View  07/26/2013   CLINICAL DATA:  Shortness of breath with chest pain and tachycardia. Fever. History of COPD and seizures.  EXAM: PORTABLE CHEST - 1 VIEW  COMPARISON:  Radiographs 07/25/2013 and 07/08/2013.  FINDINGS: 1751 hr. The heart size and mediastinal contours are stable. Accentuated interstitial markings compared with the prior studies may be related to portable technique, although mild superimposed edema is difficult to exclude. There is no airspace disease, pleural effusion or pneumothorax. The osseous structures appear unchanged.  IMPRESSION: Mild accentuation of interstitial markings, likely technical. No definite acute findings.   Electronically Signed   By: Camie Patience M.D.   On: 07/26/2013 18:06    Assessment/Plan:   80 year-old female with left hydronephrosis-this may be chronic, or acute. With associated fever and leukocytosis as well as recent change in clinical status, I am worried that this may be pyonephrosis. I think that urgent management of her obstruction is warranted. At this point, she is not a candidate for an anesthetic. I would suggest urgent percutaneous nephrostomy tube placement. With her size, this should be fairly easy. I cannot tell that she has been on any anticoagulants recently other than perhaps 1 81 mg aspirin. I will speak with the interventional radiologist on tonight for possible left percutaneous nephrostomy tube placement.    I will speak with the patient's son about this. I have spoken with Dr. Tyrell Antonio.    LOS: 1 day   Jorja Loa 07/26/2013, 6:30 PM

## 2013-07-26 NOTE — Progress Notes (Addendum)
ANTIBIOTIC CONSULT NOTE - INITIAL  Pharmacy Consult for Vancomycin and Aztreonam Indication: rule out sepsis  Allergies  Allergen Reactions  . Penicillins Anaphylaxis  . Dilaudid [Hydromorphone Hcl]     Severe sedation per son    Patient Measurements: Height: 5\' 2"  (157.5 cm) Weight: 102 lb 1.2 oz (46.3 kg) IBW/kg (Calculated) : 50.1  Vital Signs: Temp: 102.3 F (39.1 C) (07/22 1756) Temp src: Axillary (07/22 1756) BP: 137/72 mmHg (07/22 1723) Pulse Rate: 156 (07/22 1723) Intake/Output from previous day: 07/21 0701 - 07/22 0700 In: 1036.7 [I.V.:1036.7] Out: 300 [Urine:300]  Labs:  Recent Labs  07/25/13 1225 07/26/13 0505  WBC 13.9* 13.3*  HGB 12.3 10.9*  PLT 88* 80*  CREATININE 1.70* 1.36*   Estimated Creatinine Clearance: 23.3 ml/min (by C-G formula based on Cr of 1.36). No results found for this basename: VANCOTROUGH, Corlis Leak, VANCORANDOM, [place], GENTPEAK, GENTRANDOM, TOBRATROUGH, TOBRAPEAK, TOBRARND, AMIKACINPEAK, AMIKACINTROU, AMIKACIN,  in the last 72 hours   Microbiology: Recent Results (from the past 720 hour(s))  CLOSTRIDIUM DIFFICILE BY PCR     Status: None   Collection Time    07/09/13  2:07 PM      Result Value Ref Range Status   C difficile by pcr NEGATIVE  NEGATIVE Final  MRSA PCR SCREENING     Status: None   Collection Time    07/25/13  6:41 PM      Result Value Ref Range Status   MRSA by PCR NEGATIVE  NEGATIVE Final   Comment:            The GeneXpert MRSA Assay (FDA     approved for NASAL specimens     only), is one component of a     comprehensive MRSA colonization     surveillance program. It is not     intended to diagnose MRSA     infection nor to guide or     monitor treatment for     MRSA infections.    Medical History: Past Medical History  Diagnosis Date  . COPD (chronic obstructive pulmonary disease)   . Arthritis   . Stroke     TIA four years ago  . Depression   . Seizures     x 2 last year due to bleeding to  the brain.  . Memory difficulties   . Chronic diarrhea   . Hiatal hernia   . Cancer     Skin cancer    Anti-infectives:  7/22 >> Vanc >> 7/22 >> Aztreonam >>     Assessment: 16 yoF presented to [facility] ED on 7/21 with severe abdominal pain, diarrhea, somnolence, poor PO intake and CT significant for severe hydronephrosis. History includes pancreatic stones scheduled to be removed at [facility] in ~2 wks, kidney stones, COPD, TIA, skin cancer, seizures. She was transferred to [facility] on 7/22 afternoon for urological evaluation; consider cystoscopy and ureteral stent placement on Friday. Rapid response was called for severe and acute respiratory distress, abdominal distention, pain, tachycardia, fever. Pharmacy is consulted to dose Vancomycin and Aztreonam for r/o sepsis, likely related to pyelonephritis.   Tmax: 104.4  WBCs: 13.3  Renal: SCr 1.36, CrCl ~ 23 ml/min  (Baseline SCr ~ 0.7, but AKI w/ severe hydronephrosis on admission and SCr 1.7)  Urine culture pending  Goal of Therapy:  Vancomycin trough level 15-20 mcg/ml Appropriate abx dosing, eradication of infection.   Plan:   Aztreonam 1g IV q8h  Vancomycin 500mg  IV q24h.  Measure Vanc trough at steady  state.  Follow up renal fxn and culture results.   Gretta Arab PharmD, BCPS Pager [phone] 07/26/2013 7:40 PM

## 2013-07-26 NOTE — Procedures (Addendum)
Procedure:  Left percutaneous nephrostomy Findings:  Severe hydronephrosis by [place].  10 Fr PCN advanced to renal pelvis.  Cloudy and bloody urine.  Sample sent for culture. Will leave PCN to gravity bag.

## 2013-07-26 NOTE — Progress Notes (Signed)
UR chart review completed.  

## 2013-07-26 NOTE — Progress Notes (Addendum)
PROGRESS NOTE  Bridget Ball:[id number] DOB: 25-Oct-1928 DOA: 07/25/2013 PCP: Delphina Cahill, MD [facility]  Summary: 80 year old woman presented with history of abdominal pain, ongoing diarrhea, poor oral intake. Admitted for acute kidney injury, hydronephrosis, elevated troponin. Evaluated by urology day of admission with recommendations for cystoscopy, left retrograde and possible stent placement  Assessment/Plan: 1. Acute kidney injury. Secondary to diarrhea and poor oral intake rather than stable/worsening hydronephrosis. Rapidly improved with hydration. 2. Severe dehydration. Secondary to poor oral intake compounded by severe diarrhea. 3. Left-sided UPJ obstruction with resulting severe hydronephrosis of unknown duration. Urology has recommended transfer to [facility] long for cystoscopy and further treatment. 4. History microscopic colitis on budesonide Imodium . 5. Elevated troponin. No history of cardiac disease. Thought to be demand ischemia from sinus tachycardia. Discussed with cardiology, recommended echocardiogram and if reassuring proceed with any procedure as needed. 6. Hypokalemia. 7. Leukocytosis. 8. New thrombocytopenia and normocytic anemia. Significance unclear. No evidence of bleeding. Follow clinically. 9. Transaminitis. Etiology unclear. 10. Subacute diarrhea, abdominal pain since July 4. CT abdomen pelvis no evidence of colitis. Improved. 11. Chronic pancreatitis, stone and pancreatic duct. Appears asymptomatic at this point. 12. COPD 13. Seizure disorder secondary to recurrent SAH 14. H/o stroke    Overall clinically improving with improving kidney function. Plan to continue IV fluids. Recheck complete metabolic panel the morning.  Discussed with Dr. Alinda Money urology--he does not feel transfer necessarily indicated at this point. He will discuss with Dr. Eulogio Ditch and make further recommendations. ADDENDUM--Dr. Alinda Money discussed with Dr. Eulogio Ditch, urology continues to  recommend transfer to [facility]. I discussed with son at beside. Transfer to [facility] 3 Dr. Tyrell Antonio.  Replete potassium.  Send urine culture.  Repeat CBC in the morning. Discussed in detail with son at bedside, reviewed laboratory testing including thrombocytopenia and anemia, treatment plan and urology recommendations currently.   Code Status: full code DVT prophylaxis: SCDs  Family Communication:  Disposition Plan:   Murray Hodgkins, MD  [facility]  Pager [phone] If 7PM-7AM, please contact night-coverage at [email or web address], password [facility] 07/26/2013, 8:20 AM  LOS: 1 day   Consultants:  Urology  Cardiology  Procedures:    Antibiotics:    HPI/Subjective: Feels fine. No chest pain or shortness of breath. No abdominal pain. No diarrhea.  Objective: Filed Vitals:   07/26/13 0300 07/26/13 0400 07/26/13 0430 07/26/13 0500  BP: 94/57 98/52  99/63  Pulse:   109   Temp:   98.5 F (36.9 C)   TempSrc:   Oral   Resp: 21 26 24 25   Height:      Weight:    46.3 kg (102 lb 1.2 oz)  SpO2:   96%     Intake/Output Summary (Last 24 hours) at 07/26/13 0820 Last data filed at 07/26/13 0300  Gross per 24 hour  Intake    900 ml  Output    300 ml  Net    600 ml     Filed Weights   07/25/13 1132 07/26/13 0500  Weight: 46.409 kg (102 lb 5 oz) 46.3 kg (102 lb 1.2 oz)    Exam:     Afebrile, borderline tachycardia, borderline low blood pressure. Gen. Appears calm and comfortable. Nontoxic.  Psych. Alert. Speech fluent and appropriate.  Eyes. Grossly unremarkable.  ENT. Lips appear unremarkable.  Cardiovascular. Regular rate and rhythm. No murmur, rub or gallop. No lower extremity edema.  Respiratory. Clear to auscultation bilaterally. No wheezes, rales or rhonchi. Normal respiratory effort.  Abdomen. Soft, nontender, nondistended.  Skin. Grossly unremarkable.  Musculoskeletal. Grossly unremarkable.  Neurologic. Nonfocal.  Data Reviewed: I/O: Urine output 300+  unmeasured output  Chemistry: Potassium 3.1. BUN, creatinine improving. AST, ALT trending downwards. Subsequent troponins negative.  Heme: Hemoglobin 10.9, WBC 13.3 without significant change, platelet count 80 without significant change.  ID: Urine culture will be sent  Imaging: CT imaging demonstrates stable left-sided UPJ obstruction with severe hydronephrosis  Scheduled Meds: . antiseptic oral rinse  15 mL Mouth Rinse BID  . aspirin EC  325 mg Oral Daily  . budesonide-formoterol  2 puff Inhalation BID  . citalopram  10 mg Oral Daily  . LORazepam  0.5 mg Oral TID  . pantoprazole  40 mg Oral QAC breakfast  . sodium chloride  3 mL Intravenous Q12H   Continuous Infusions: . sodium chloride 1,000 mL (07/26/13 0422)    Principal Problem:   AKI (acute kidney injury) Active Problems:   Diarrhea   Abdominal pain, unspecified site   Hydronephrosis, left   Dehydration   Elevated troponin   Time spent 40 minutes greater than 50% counseling and coordination of care.

## 2013-07-26 NOTE — Consult Note (Signed)
PULMONARY / CRITICAL CARE MEDICINE   Name: Bridget Ball MRN: [id number] DOB: Dec 17, 1928    ADMISSION DATE:  07/25/2013 CONSULTATION DATE:  07/26/2013  REFERRING MD :  Tyrell Antonio  CHIEF COMPLAINT:  Respiratory Distress  INITIAL PRESENTATION: 80 y.o. F who initially presented to outside hospital 7/17 with abdominal pain, diarrhea.  Workup was essentially negative (aside from CT abdo which revealed left hydronephrosis) , she was treated with pain meds and discharged home.  On 7/21, she returned to ED with continued abdominal pain, diarrhea, poor oral intake. She was admitted for further evaluation.  On 7/22,  She was transferred to ICU for urological eval for left hydro / cystoscopy and possible stent placement. Later in afternoon, pt developed significant abdominal / lower back pain and became very SOB. Rapid response was called and she was transferred to the ICU for concerns that she may require BiPAP.  PCCM was consulted for respiratory distress.  Note, pt is DNR status.  STUDIES:  7/17 CT abdo >>> stable hepatic cysts, cholelithiasis without inflammation, new severe left hydronephrosis concerning for UPJ stenosis. 7/21 CT abdo >>> stable left sided UPJ obstruction with severe hydro, cholelithiasis, stable hepatic cysts.  SIGNIFICANT EVENTS: 7/17 - initial presentation to [facility] ED, discharged home. 7/21 - presented again to [facility] ED for same symptoms (abd pain) 7/22 - transferred to [facility] for urological evaluation, 7/22 - ? Percutaneous nephrostomy tube   HISTORY OF PRESENT ILLNESS:  Bridget Ball is a 80 y.o. F with PMH as outlined below.  She initially presented to [facility] ED on 7/17 with abdominal pain.  At that time, she was treated with pain meds and discharged home.  CT abdo at the time revealed left hydronephrosis.  On 7/21, she presented to [facility] ED once again for worsened abd pain and some diarrhea.  CT abdo revealed left sided UPJ obstruction with severe hydro.  She was admitted for AKI, IVF  resuscitation.  On 7/22, she was transferred to [facility] so that she could be seen by urology for possible intervention of left sided UPJ obstruction.  Later that afternoon, she complained of worsened pain in her abdomen and lower back, mainly left sided and then developed respiratory distress, prompting RN to call rapid response.  RRT assessed pt and transferred her to ICU for concerns that she may require BiPAP.  She also had rectal temp of 104.  PCCM was consulted.  PAST MEDICAL HISTORY :  Past Medical History  Diagnosis Date  . COPD (chronic obstructive pulmonary disease)   . Arthritis   . Stroke     TIA four years ago  . Depression   . Seizures     x 2 last year due to bleeding to the brain.  . Memory difficulties   . Chronic diarrhea   . Hiatal hernia   . Cancer     Skin cancer   Past Surgical History  Procedure Laterality Date  . Appendectomy    . Skin cancer excision    . Tubal ligation    . Colonoscopy N/A 05/24/2013    Procedure: COLONOSCOPY;  Surgeon: Rogene Houston, MD;  Location: [facility];  Service: Endoscopy;  Laterality: N/A;  100  . Esophagogastroduodenoscopy N/A 05/24/2013    Procedure: ESOPHAGOGASTRODUODENOSCOPY (EGD);  Surgeon: Rogene Houston, MD;  Location: [facility];  Service: Endoscopy;  Laterality: N/A;   Prior to Admission medications   Medication Sig Start Date End Date Taking? Authorizing Provider  budesonide (ENTOCORT EC) 3 MG 24  hr capsule Take 1 capsule (3 mg total) by mouth 2 (two) times daily. 07/12/13  Yes Nita Sells, MD  budesonide-formoterol (SYMBICORT) 160-4.5 MCG/ACT inhaler Inhale 2 puffs into the lungs 2 (two) times daily.   Yes Historical Provider, MD  citalopram (CELEXA) 10 MG tablet TAKE 1 TABLET (10 MG TOTAL) BY MOUTH DAILY. 07/24/13  Yes Antony Contras, MD  docusate sodium (COLACE) 100 MG capsule Take one capsule a day unless diarrhea begins 07/21/13  Yes Maudry Diego, MD  HYDROcodone-acetaminophen (NORCO/VICODIN) 5-325 MG per  tablet Take one tablet every 6-8 hours for pain 07/21/13  Yes Maudry Diego, MD  LORazepam (ATIVAN) 0.5 MG tablet Take 0.5 mg by mouth 3 (three) times daily.   Yes Historical Provider, MD  Melatonin 5 MG TABS Take 5 mg by mouth at bedtime.   Yes Historical Provider, MD  pantoprazole (PROTONIX) 40 MG tablet Take 1 tablet (40 mg total) by mouth daily before breakfast. 05/24/13  Yes Rogene Houston, MD   Allergies  Allergen Reactions  . Penicillins Anaphylaxis  . Dilaudid [Hydromorphone Hcl]     Severe sedation per son    FAMILY HISTORY:  Family History  Problem Relation Age of Onset  . Diabetes Mother    SOCIAL HISTORY:  reports that she quit smoking about 15 years ago. She does not have any smokeless tobacco history on file. She reports that she drinks about 4.2 ounces of alcohol per week. She reports that she does not use illicit drugs.  REVIEW OF SYSTEMS:  All negative; except for those that are bolded, which indicate positives.  Constitutional: weight loss, weight gain, night sweats, fevers, chills, fatigue, weakness.  HEENT: headaches, sore throat, sneezing, nasal congestion, post nasal drip, difficulty swallowing, tooth/dental problems, visual complaints, visual changes, ear aches. Neuro: difficulty with speech, weakness, numbness, ataxia. CV:  chest pain, orthopnea, PND, swelling in lower extremities, dizziness, palpitations, syncope.  Resp: cough, hemoptysis, dyspnea, wheezing. GI  heartburn, indigestion, abdominal pain, nausea, vomiting, diarrhea, constipation, change in bowel habits, loss of appetite, hematemesis, melena, hematochezia.  GU: dysuria, change in color of urine, urgency or frequency, flank pain, hematuria. MSK: joint pain or swelling, decreased range of motion. Psych: change in mood or affect, depression, anxiety, suicidal ideations, homicidal ideations. Skin: rash, itching, bruising.   SUBJECTIVE:   VITAL SIGNS: Temp:  [97.7 F (36.5 C)-104.4 F (40.2 C)]  102.3 F (39.1 C) (07/22 1756) Pulse Rate:  [73-163] 156 (07/22 1723) Resp:  [18-30] 26 (07/22 1712) BP: (78-158)/(46-86) 137/72 mmHg (07/22 1723) SpO2:  [85 %-100 %] 100 % (07/22 1718) Weight:  [46.3 kg (102 lb 1.2 oz)] 46.3 kg (102 lb 1.2 oz) (07/22 0500) HEMODYNAMICS:   VENTILATOR SETTINGS:   INTAKE / OUTPUT:  Intake/Output Summary (Last 24 hours) at 07/26/13 1829 Last data filed at 07/26/13 1227  Gross per 24 hour  Intake 1516.67 ml  Output    300 ml  Net 1216.67 ml    PHYSICAL EXAMINATION: General: Chronically ill appearing female, in NAD. Neuro: A&O x 3 though slow to answer questions. HEENT: [place]/AT. PERRL, sclerae anicteric. Cardiovascular: Tachy but regular, no M/R/G.  Lungs: Respirations shallow.  CTA, no W/R/R. Abdomen: BS x 4, soft, tender. + CVA tenderness on left Musculoskeletal: No gross deformities, no edema.  Skin: Intact, warm, no rashes.   LABS:  CBC  Recent Labs Lab 07/21/13 1745 07/25/13 1225 07/26/13 0505  WBC 10.5 13.9* 13.3*  HGB 13.1 12.3 10.9*  HCT 39.3 35.9* 32.6*  PLT 265  88* 80*   Coag's No results found for this basename: APTT, INR,  in the last 168 hours BMET  Recent Labs Lab 07/21/13 1745 07/25/13 1225 07/26/13 0505  NA 137 135* 137  K 3.6* 3.5* 3.1*  CL 99 96 100  CO2 26 27 26   BUN 14 38* 31*  CREATININE 0.82 1.70* 1.36*  GLUCOSE 104* 106* 91   Electrolytes  Recent Labs Lab 07/21/13 1745 07/25/13 1225 07/26/13 0505  CALCIUM 8.9 8.2* 7.8*   Sepsis Markers  Recent Labs Lab 07/25/13 1252  LATICACIDVEN 1.6   ABG  Recent Labs Lab 07/26/13 1731  PHART 7.275*  PCO2ART 52.2*  PO2ART 141.0*   Liver Enzymes  Recent Labs Lab 07/21/13 1745 07/25/13 1225 07/26/13 0505  AST 16 79* 40*  ALT 16 62* 48*  ALKPHOS 43 63 76  BILITOT 0.6 0.4 0.3  ALBUMIN 3.8 2.6* 2.2*   Cardiac Enzymes  Recent Labs Lab 07/25/13 1900 07/25/13 2333 07/26/13 0505  TROPONINI 0.43* <0.30 <0.30   Glucose No results  found for this basename: GLUCAP,  in the last 168 hours  Imaging Ct Abdomen Pelvis Wo Contrast  07/25/2013   CLINICAL DATA:  Nausea, vomiting and diarrhea.  EXAM: CT ABDOMEN AND PELVIS WITHOUT CONTRAST  TECHNIQUE: Multidetector CT imaging of the abdomen and pelvis was performed following the standard protocol without IV contrast.  COMPARISON:  07/21/2013.  FINDINGS: The lung bases are stable.  Patchy bibasilar atelectasis.  The liver is grossly normal and stable. Small cysts are again demonstrated. Cholelithiasis is noted. The spleen is normal in size. No focal lesions. The pancreas is grossly normal.  Severe left-sided hydronephrosis due to a chronic UPJ obstruction. No obstructing ureteral calculus. There is persistent contrast in the left kidney from the recent CT scan. The right kidney is normal.  The bladder is unremarkable. Uterus and ovaries are unremarkable and stable. No mesenteric or retroperitoneal mass or adenopathy. Stable scattered lymph nodes. Stable advanced atherosclerotic calcifications involving the aorta and branch vessels.  No pelvic mass or adenopathy.  No inguinal mass or adenopathy.  The bony structures are stable.  IMPRESSION: Stable left-sided UPJ obstruction with severe hydronephrosis.  Chololithiasis.  Bibasilar atelectasis.  Stable hepatic cysts.   Electronically Signed   By: Kalman Jewels M.D.   On: 07/25/2013 14:20   Dg Chest 2 View  07/25/2013   CLINICAL DATA:  Severe abdominal pain  EXAM: CHEST  2 VIEW  COMPARISON:  None.  FINDINGS: The lungs are hyperinflated likely secondary to COPD. There is no focal parenchymal opacity, pleural effusion, or pneumothorax. The heart and mediastinal contours are unremarkable. There is thoracic aortic atherosclerosis.  The osseous structures are unremarkable.  IMPRESSION: No active cardiopulmonary disease.   Electronically Signed   By: Kathreen Devoid   On: 07/25/2013 13:51     ASSESSMENT / PLAN:  PULMONARY A: Acute respiratory failure  - multifactorial given underlying COPD, CHF, pain.  CXR reviewed, no clear infiltrate to suggest PNA. COPD DNI status P:   - Supplemental O2 to maintain SpO2 > 92%. - May require BiPAP - doing well on [place] at the moment. - D/c Symbicort as I question whether pt can effectively use inhaler at this time. - Add budesonide / brovana BID. - Monitor for deterioration.  CARDIOVASCULAR A:  CHF SIRS  Hypotension - note pt states she normally runs low BP, SBP roughly in high 90's. DNR / DNI Status. P:  - Cards consulted for tachycardia, have started digoxin and cardizem. -  250cc bolus going now, will add another 250cc.  RENAL A:   Left UPJ obstruction with hydronephrosis Hypokalemia AKI - likely from dehydration and hydro. P:   - Per urology, percutaneous nephrostomy tube hopefully tonight. - K replacement. - CMP in AM.  GASTROINTESTINAL A:  GERD Abdominal pain - lipase from 7/21 normal. ? From UPJ obstruction/hydro Diarrhea prior to admit - per RN, pt has not had any diarrhea since admission. P:   - NPO. - Pantoprazole. - Repeat lactate. - KUB. - C.diff PCR sent.  HEMATOLOGIC A:   Mild anemia P:  - Transfuse for Hgb < 7. - CBC in AM.  INFECTIOUS A:   SIRS - question pyelo as source with hydro/UPJ obstruction. P:   - Blood Cx x2 7/22 - UC 7/22 - C.Diff PCR 7/22 - Abx: Aztreonam, start date 7/22, day 1/x - Abx: Vanc, start date 7/22, day 1/x - D/c Levaquin per pharmacy (given addition of aztreonam) - D/c Flagyl. - Source control - percutaneous stent tonight.  ENDOCRINE A:  Chronic pancreatitis with stable stone - per EDP notes, scheduled to be removed in 2 weeks at [facility]. P:   - No intervention required.  NEUROLOGIC A:  No acute issues P:   - Monitor.  TODAY'S SUMMARY: Called by [facility] to assess pt for respiratory distress, concern that she may require BiPAP.  Pt transferred to ICU and on my arrival to room, she is on 2L [place], SpO2 96%.  She has some  tachypnea but is complaining of abdominal pain. Question whether respiratory distress is primarily due to pain / anxiety.  CXR does not reveal any infiltrate for PNA or other acute process.  Pt did have fever to 104 this PM.  Given left hydro and UPJ obstruction, strong suspicion for pyelo.  No additional clear source of infection at this time.   Urology has seen pt, recommending urgent perc stent placement tonight. Continue Vanc and add Aztreonam for presumed pyelo (note anaphylactic allergy to penicillins).  D/c levaquin per pharmacy given addition of Aztreonam.  D/c flagyl as do not suspect abdominal source. Follow cultures and monitor closely.  Note that pt is DNR / DNI status  Montey Hora, [place] Pgr: [phone]  or [phone]   Merton Border, MD ; [facility] service Mobile [phone].  After 5:30 PM or weekends, call [phone]   Pulmonary and [place] Pager: [phone]  07/26/2013, 6:29 PM

## 2013-07-26 NOTE — Consult Note (Signed)
Reason for Consult:positve troponins Referring Physician: PTH  Bridget Ball is an 80 y.o. female.  HPI: this is an 80 y.o. Female patient admitted with dehydration secondary to diarrhea and poor intake. Her initial troponin was .43 and f/u were normal. EKG showed Sinus tachycardia with  Incomplete RBBB and ST elevation inferiorly which was new for her. She has no prior cardiac history. She has severe hydronephrosis on CT abd.  Past Medical History  Diagnosis Date  . COPD (chronic obstructive pulmonary disease)   . Arthritis   . Stroke     TIA four years ago  . Depression   . Seizures     x 2 last year due to bleeding to the brain.  . Memory difficulties   . Chronic diarrhea   . Hiatal hernia   . Cancer     Skin cancer    Past Surgical History  Procedure Laterality Date  . Appendectomy    . Skin cancer excision    . Tubal ligation    . Colonoscopy N/A 05/24/2013    Procedure: COLONOSCOPY;  Surgeon: Rogene Houston, MD;  Location: [facility];  Service: Endoscopy;  Laterality: N/A;  100  . Esophagogastroduodenoscopy N/A 05/24/2013    Procedure: ESOPHAGOGASTRODUODENOSCOPY (EGD);  Surgeon: Rogene Houston, MD;  Location: [facility];  Service: Endoscopy;  Laterality: N/A;    Family History  Problem Relation Age of Onset  . Diabetes Mother     Social History:  reports that she quit smoking about 15 years ago. She does not have any smokeless tobacco history on file. She reports that she drinks about 4.2 ounces of alcohol per week. She reports that she does not use illicit drugs.  Allergies:  Allergies  Allergen Reactions  . Penicillins Anaphylaxis  . Dilaudid [Hydromorphone Hcl]     Severe sedation per son    Medications:  Scheduled Meds: . antiseptic oral rinse  15 mL Mouth Rinse BID  . aspirin EC  325 mg Oral Daily  . budesonide-formoterol  2 puff Inhalation BID  . citalopram  10 mg Oral Daily  . LORazepam  0.5 mg Oral TID  . pantoprazole  40 mg Oral QAC  breakfast  . sodium chloride  3 mL Intravenous Q12H   Continuous Infusions: . sodium chloride 1,000 mL (07/26/13 0422)   PRN Meds:.acetaminophen, acetaminophen, ondansetron (ZOFRAN) IV, ondansetron   Results for orders placed during the hospital encounter of 07/25/13 (from the past 48 hour(s))  CBC WITH DIFFERENTIAL     Status: Abnormal   Collection Time    07/25/13 12:25 PM      Result Value Ref Range   WBC 13.9 (*) 4.0 - 10.5 K/uL   RBC 3.81 (*) 3.87 - 5.11 MIL/uL   Hemoglobin 12.3  12.0 - 15.0 g/dL   HCT 35.9 (*) 36.0 - 46.0 %   MCV 94.2  78.0 - 100.0 fL   MCH 32.3  26.0 - 34.0 pg   MCHC 34.3  30.0 - 36.0 g/dL   RDW 15.3  11.5 - 15.5 %   Platelets 88 (*) 150 - 400 K/uL   Comment: SPECIMEN CHECKED FOR CLOTS     PLATELET COUNT CONFIRMED BY SMEAR   Neutrophils Relative % 96 (*) 43 - 77 %   Neutro Abs 13.3 (*) 1.7 - 7.7 K/uL   Lymphocytes Relative 2 (*) 12 - 46 %   Lymphs Abs 0.2 (*) 0.7 - 4.0 K/uL   Monocytes Relative 3  3 -  12 %   Monocytes Absolute 0.4  0.1 - 1.0 K/uL   Eosinophils Relative 0  0 - 5 %   Eosinophils Absolute 0.0  0.0 - 0.7 K/uL   Basophils Relative 0  0 - 1 %   Basophils Absolute 0.0  0.0 - 0.1 K/uL   WBC Morphology INCREASED BANDS (>20% BANDS)    BASIC METABOLIC PANEL     Status: Abnormal   Collection Time    07/25/13 12:25 PM      Result Value Ref Range   Sodium 135 (*) 137 - 147 mEq/L   Potassium 3.5 (*) 3.7 - 5.3 mEq/L   Chloride 96  96 - 112 mEq/L   CO2 27  19 - 32 mEq/L   Glucose, Bld 106 (*) 70 - 99 mg/dL   BUN 38 (*) 6 - 23 mg/dL   Creatinine, Ser 1.70 (*) 0.50 - 1.10 mg/dL   Calcium 8.2 (*) 8.4 - 10.5 mg/dL   GFR calc non Af Amer 27 (*) >90 mL/min   GFR calc Af Amer 31 (*) >90 mL/min   Comment: (NOTE)     The eGFR has been calculated using the CKD EPI equation.     This calculation has not been validated in all clinical situations.     eGFR's persistently <90 mL/min signify possible Chronic Kidney     Disease.   Anion gap 12  5 - 15   HEPATIC FUNCTION PANEL     Status: Abnormal   Collection Time    07/25/13 12:25 PM      Result Value Ref Range   Total Protein 5.9 (*) 6.0 - 8.3 g/dL   Albumin 2.6 (*) 3.5 - 5.2 g/dL   AST 79 (*) 0 - 37 U/L   ALT 62 (*) 0 - 35 U/L   Alkaline Phosphatase 63  39 - 117 U/L   Total Bilirubin 0.4  0.3 - 1.2 mg/dL   Bilirubin, Direct <0.2  0.0 - 0.3 mg/dL   Indirect Bilirubin NOT CALCULATED  0.3 - 0.9 mg/dL  LIPASE, BLOOD     Status: None   Collection Time    07/25/13 12:25 PM      Result Value Ref Range   Lipase 12  11 - 59 U/L  TROPONIN I     Status: Abnormal   Collection Time    07/25/13 12:25 PM      Result Value Ref Range   Troponin I 0.48 (*) <0.30 ng/mL   Comment: CRITICAL RESULT CALLED TO, READ BACK BY AND VERIFIED WITH:     POINDEXTER,M AT 1340 BY HUFFINES,S ON 07/25/13                Due to the release kinetics of cTnI,     a negative result within the first hours     of the onset of symptoms does not rule out     myocardial infarction with certainty.     If myocardial infarction is still suspected,     repeat the test at appropriate intervals.  LACTIC ACID, PLASMA     Status: None   Collection Time    07/25/13 12:52 PM      Result Value Ref Range   Lactic Acid, Venous 1.6  0.5 - 2.2 mmol/L  TROPONIN I     Status: Abnormal   Collection Time    07/25/13  3:14 PM      Result Value Ref Range   Troponin I 0.54 (*) <0.30 ng/mL  Comment:            Due to the release kinetics of cTnI,     a negative result within the first hours     of the onset of symptoms does not rule out     myocardial infarction with certainty.     If myocardial infarction is still suspected,     repeat the test at appropriate intervals.     CRITICAL VALUE NOTED.  VALUE IS CONSISTENT WITH PREVIOUSLY REPORTED AND CALLED VALUE.  URINALYSIS, ROUTINE W REFLEX MICROSCOPIC     Status: Abnormal   Collection Time    07/25/13  5:35 PM      Result Value Ref Range   Color, Urine YELLOW  YELLOW    APPearance HAZY (*) CLEAR   Specific Gravity, Urine <1.005 (*) 1.005 - 1.030   pH >9.0 (*) 5.0 - 8.0   Glucose, UA NEGATIVE  NEGATIVE mg/dL   Hgb urine dipstick MODERATE (*) NEGATIVE   Bilirubin Urine NEGATIVE  NEGATIVE   Ketones, ur NEGATIVE  NEGATIVE mg/dL   Protein, ur 100 (*) NEGATIVE mg/dL   Urobilinogen, UA 0.2  0.0 - 1.0 mg/dL   Nitrite NEGATIVE  NEGATIVE   Leukocytes, UA LARGE (*) NEGATIVE  URINE MICROSCOPIC-ADD ON     Status: Abnormal   Collection Time    07/25/13  5:35 PM      Result Value Ref Range   Squamous Epithelial / LPF FEW (*) RARE   WBC, UA 7-10  <3 WBC/hpf   RBC / HPF 3-6  <3 RBC/hpf   Bacteria, UA MANY (*) RARE   Crystals TRIPLE PHOSPHATE CRYSTALS (*) NEGATIVE  MRSA PCR SCREENING     Status: None   Collection Time    07/25/13  6:41 PM      Result Value Ref Range   MRSA by PCR NEGATIVE  NEGATIVE   Comment:            The GeneXpert MRSA Assay (FDA     approved for NASAL specimens     only), is one component of a     comprehensive MRSA colonization     surveillance program. It is not     intended to diagnose MRSA     infection nor to guide or     monitor treatment for     MRSA infections.  TROPONIN I     Status: Abnormal   Collection Time    07/25/13  7:00 PM      Result Value Ref Range   Troponin I 0.43 (*) <0.30 ng/mL   Comment:            Due to the release kinetics of cTnI,     a negative result within the first hours     of the onset of symptoms does not rule out     myocardial infarction with certainty.     If myocardial infarction is still suspected,     repeat the test at appropriate intervals.     CRITICAL VALUE NOTED.  VALUE IS CONSISTENT WITH PREVIOUSLY REPORTED AND CALLED VALUE.  TROPONIN I     Status: None   Collection Time    07/25/13 11:33 PM      Result Value Ref Range   Troponin I <0.30  <0.30 ng/mL   Comment:            Due to the release kinetics of cTnI,     a negative result within the first  hours     of the onset of  symptoms does not rule out     myocardial infarction with certainty.     If myocardial infarction is still suspected,     repeat the test at appropriate intervals.  CBC     Status: Abnormal   Collection Time    07/26/13  5:05 AM      Result Value Ref Range   WBC 13.3 (*) 4.0 - 10.5 K/uL   RBC 3.43 (*) 3.87 - 5.11 MIL/uL   Hemoglobin 10.9 (*) 12.0 - 15.0 g/dL   HCT 32.6 (*) 36.0 - 46.0 %   MCV 95.0  78.0 - 100.0 fL   MCH 31.8  26.0 - 34.0 pg   MCHC 33.4  30.0 - 36.0 g/dL   RDW 15.3  11.5 - 15.5 %   Platelets 80 (*) 150 - 400 K/uL   Comment: SPECIMEN CHECKED FOR CLOTS     CONSISTENT WITH PREVIOUS RESULT  COMPREHENSIVE METABOLIC PANEL     Status: Abnormal   Collection Time    07/26/13  5:05 AM      Result Value Ref Range   Sodium 137  137 - 147 mEq/L   Potassium 3.1 (*) 3.7 - 5.3 mEq/L   Chloride 100  96 - 112 mEq/L   CO2 26  19 - 32 mEq/L   Glucose, Bld 91  70 - 99 mg/dL   BUN 31 (*) 6 - 23 mg/dL   Creatinine, Ser 1.36 (*) 0.50 - 1.10 mg/dL   Calcium 7.8 (*) 8.4 - 10.5 mg/dL   Total Protein 5.3 (*) 6.0 - 8.3 g/dL   Albumin 2.2 (*) 3.5 - 5.2 g/dL   AST 40 (*) 0 - 37 U/L   ALT 48 (*) 0 - 35 U/L   Alkaline Phosphatase 76  39 - 117 U/L   Total Bilirubin 0.3  0.3 - 1.2 mg/dL   GFR calc non Af Amer 35 (*) >90 mL/min   GFR calc Af Amer 41 (*) >90 mL/min   Comment: (NOTE)     The eGFR has been calculated using the CKD EPI equation.     This calculation has not been validated in all clinical situations.     eGFR's persistently <90 mL/min signify possible Chronic Kidney     Disease.   Anion gap 11  5 - 15  TROPONIN I     Status: None   Collection Time    07/26/13  5:05 AM      Result Value Ref Range   Troponin I <0.30  <0.30 ng/mL   Comment:            Due to the release kinetics of cTnI,     a negative result within the first hours     of the onset of symptoms does not rule out     myocardial infarction with certainty.     If myocardial infarction is still suspected,      repeat the test at appropriate intervals.    Ct Abdomen Pelvis Wo Contrast  07/25/2013   CLINICAL DATA:  Nausea, vomiting and diarrhea.  EXAM: CT ABDOMEN AND PELVIS WITHOUT CONTRAST  TECHNIQUE: Multidetector CT imaging of the abdomen and pelvis was performed following the standard protocol without IV contrast.  COMPARISON:  07/21/2013.  FINDINGS: The lung bases are stable.  Patchy bibasilar atelectasis.  The liver is grossly normal and stable. Small cysts are again demonstrated. Cholelithiasis is noted. The spleen is normal in  size. No focal lesions. The pancreas is grossly normal.  Severe left-sided hydronephrosis due to a chronic UPJ obstruction. No obstructing ureteral calculus. There is persistent contrast in the left kidney from the recent CT scan. The right kidney is normal.  The bladder is unremarkable. Uterus and ovaries are unremarkable and stable. No mesenteric or retroperitoneal mass or adenopathy. Stable scattered lymph nodes. Stable advanced atherosclerotic calcifications involving the aorta and Tabatha Razzano vessels.  No pelvic mass or adenopathy.  No inguinal mass or adenopathy.  The bony structures are stable.  IMPRESSION: Stable left-sided UPJ obstruction with severe hydronephrosis.  Chololithiasis.  Bibasilar atelectasis.  Stable hepatic cysts.   Electronically Signed   By: Kalman Jewels M.D.   On: 07/25/2013 14:20   Dg Chest 2 View  07/25/2013   CLINICAL DATA:  Severe abdominal pain  EXAM: CHEST  2 VIEW  COMPARISON:  None.  FINDINGS: The lungs are hyperinflated likely secondary to COPD. There is no focal parenchymal opacity, pleural effusion, or pneumothorax. The heart and mediastinal contours are unremarkable. There is thoracic aortic atherosclerosis.  The osseous structures are unremarkable.  IMPRESSION: No active cardiopulmonary disease.   Electronically Signed   By: Kathreen Devoid   On: 07/25/2013 13:51    ROS See HPI Eyes: Negative Ears:positive for hearing loss,  tinnitus Cardiovascular: Negative for chest pain, palpitations,irregular heartbeat, dyspnea, dyspnea on exertion, near-syncope, orthopnea, paroxysmal nocturnal dyspnea and syncope,edema, claudication, cyanosis,.  Respiratory:   Negative for cough, hemoptysis, shortness of breath, sleep disturbances due to breathing, sputum production and wheezing.   Endocrine: Negative for cold intolerance and heat intolerance.  Hematologic/Lymphatic: Negative for adenopathy and bleeding problem. Does not bruise/bleed easily.  Musculoskeletal: Negative.   Gastrointestinal:positive for nausea, poor intake, diarrhea,   Genitourinary: Negative for bladder incontinence, dysuria, flank pain, frequency, hematuria, hesitancy, nocturia and urgency.  Neurological: Negative.  Allergic/Immunologic: Negative for environmental allergies.  Blood pressure 112/65, pulse 101, temperature 98.3 F (36.8 C), temperature source Oral, resp. rate 26, height 5' 2"  (1.575 m), weight 102 lb 1.2 oz (46.3 kg), SpO2 96.00%. Physical Exam PHYSICAL EXAM: Well-nournished, in no acute distress. Neck: No JVD, HJR, Bruit, or thyroid enlargement Lungs: No tachypnea, clear without wheezing, rales, or rhonchi Cardiovascular: RRR, PMI not displaced, heart sounds normal, no murmurs, gallops, bruit, thrill, or heave. Abdomen: BS normal. Soft without organomegaly, masses, lesions or tenderness. Extremities: without cyanosis, clubbing or edema. Good distal pulses bilateral SKin: Warm, no lesions or rashes  Musculoskeletal: No deformities Neuro: no focal signs  Echo 2010:  SUMMARY - Overall left ventricular systolic function was normal. Left ventricular ejection fraction was estimated to be 55 %. There were no left ventricular regional wall motion abnormalities. - No obvious source of embolus but poor image quality  IMPRESSIONS - No obvious source of embolus but poor image quality     Assessment/Plan: Dehydration secondary to poor intake  and diarrhea  Elevated troponin most likely demand ischemia for tachycardia, and renal insufficiency. Repeat EKG and continue to monitor. Still a little tachycardic. Hopefully will normalize with hydration. Check 2Decho.  Severe hydronephrosis on CT. Urology consult pending. Transferring to [facility]  Acute kidney injury Crt 1.36 today.  Hx seizures secondary SAH  Ermalinda Barrios 07/26/2013, 9:26 AM    Patient seen and discussed with PA Lenze. 80 yo female hx of COPD, CVA, seizure disorder admitted with AMS, poor oral intake, and AKI due to dehydration and left hydropnephrosis. CT scan showed hydronephrosis, urology has been consulted. Cardiology consulted for mild  troponin elevation peak 0.54, now trending down. EKG with no specific ischemic changes. She has had no chest pain, chronic stable DOE she related to her COPD. CXR no acute process, but noted aorta atherosclerosis. WBC 13.9, no clear source of infection. Likely demand ischemia given her other acute systemic issues, will consider non-invasive ischemic testing once her other medical issues are more stable. She does have some atheroscerlosis of aorta noted on imaging, likely does have some chronic CAD as well. F/u echo to help further risk stratify. Continue ASA, change to 53m daily and start high dose statin. Avoid beta blocker and ACE given soft bp's and AKI. Do not see strong indication for anticoagulation at this time given mild transient troponin elevation and no chest pain. If no severe abnormalities on echo, will likely recommend proceeding with urological procedure.    JZandra AbtsMD

## 2013-07-26 NOTE — Consult Note (Signed)
Ref: Delphina Cahill, MD   Subjective:  Reason for consult- Tachycardia. 80 year old admitted for diarrhea, dehydration, has heart rate in 130-150/min with fever of 102 to 104 degree F.   Review of Systems:  HEENT: Headache, blurred vision, runny nose, sore throat  Neck: Hypothyroidism, hyperthyroidism,,lymphadenopathy  Chest : Shortness of breath, history of COPD, Asthma  Heart : Chest pain, history of coronary arterey disease  GI: Nausea, vomiting, diarrhea, constipation, GERD  GU: Dysuria, urgency, frequency of urination, hematuria  Neuro: Stroke, seizures, syncope  Psych: Depression, anxiety, hallucinations   Objective:  Vital Signs in the last 24 hours: Temp:  [97.7 F (36.5 C)-104.4 F (40.2 C)] 102.3 F (39.1 C) (07/22 1756) Pulse Rate:  [73-163] 156 (07/22 1723) Cardiac Rhythm:  [-] Sinus tachycardia (07/22 0900) Resp:  [18-30] 26 (07/22 1712) BP: (78-158)/(46-86) 137/72 mmHg (07/22 1723) SpO2:  [85 %-100 %] 100 % (07/22 1718) Weight:  [46.3 kg (102 lb 1.2 oz)] 46.3 kg (102 lb 1.2 oz) (07/22 0500)  Physical Exam: BP Readings from Last 1 Encounters:  07/26/13 137/72    Wt Readings from Last 1 Encounters:  07/26/13 46.3 kg (102 lb 1.2 oz)    Weight change:   HEENT: [place]/AT, Eyes-Hazel, PERL, EOMI, Conjunctiva-Pink, Sclera-Non-icteric Neck: Full JVD at 0 degree, No bruit, Trachea midline. Lungs:  Clear, Bilateral. Cardiac:  Tachycardic, Regular rhythm, normal S1 and S2, no S3.  Abdomen:  Soft, non-tender. Extremities:  No edema present. No cyanosis. + clubbing. CNS: AxOx1, Cranial nerves grossly intact, moves all 4 extremities.  Skin: Warm and dry.   Intake/Output from previous day: 07/21 0701 - 07/22 0700 In: 1036.7 [I.V.:1036.7] Out: 300 [Urine:300]    Lab Results: BMET    Component Value Date/Time   NA 137 07/26/2013 0505   NA 135* 07/25/2013 1225   NA 137 07/21/2013 1745   K 3.1* 07/26/2013 0505   K 3.5* 07/25/2013 1225   K 3.6* 07/21/2013 1745   CL 100  07/26/2013 0505   CL 96 07/25/2013 1225   CL 99 07/21/2013 1745   CO2 26 07/26/2013 0505   CO2 27 07/25/2013 1225   CO2 26 07/21/2013 1745   GLUCOSE 91 07/26/2013 0505   GLUCOSE 106* 07/25/2013 1225   GLUCOSE 104* 07/21/2013 1745   BUN 31* 07/26/2013 0505   BUN 38* 07/25/2013 1225   BUN 14 07/21/2013 1745   CREATININE 1.36* 07/26/2013 0505   CREATININE 1.70* 07/25/2013 1225   CREATININE 0.82 07/21/2013 1745   CALCIUM 7.8* 07/26/2013 0505   CALCIUM 8.2* 07/25/2013 1225   CALCIUM 8.9 07/21/2013 1745   GFRNONAA 35* 07/26/2013 0505   GFRNONAA 27* 07/25/2013 1225   GFRNONAA 65* 07/21/2013 1745   GFRAA 41* 07/26/2013 0505   GFRAA 31* 07/25/2013 1225   GFRAA 75* 07/21/2013 1745   CBC    Component Value Date/Time   WBC 13.3* 07/26/2013 0505   RBC 3.43* 07/26/2013 0505   HGB 10.9* 07/26/2013 0505   HCT 32.6* 07/26/2013 0505   PLT 80* 07/26/2013 0505   MCV 95.0 07/26/2013 0505   MCH 31.8 07/26/2013 0505   MCHC 33.4 07/26/2013 0505   RDW 15.3 07/26/2013 0505   LYMPHSABS 0.2* 07/25/2013 1225   MONOABS 0.4 07/25/2013 1225   EOSABS 0.0 07/25/2013 1225   BASOSABS 0.0 07/25/2013 1225   HEPATIC Function Panel  Recent Labs  07/21/13 1745 07/25/13 1225 07/26/13 0505  PROT 6.2 5.9* 5.3*   HEMOGLOBIN A1C No components found with this basename: HGA1C,  MPG  CARDIAC ENZYMES Lab Results  Component Value Date   CKTOTAL 49 01/24/2008   CKMB 4.2* 01/24/2008   TROPONINI <0.30 07/26/2013   TROPONINI <0.30 07/25/2013   TROPONINI 0.43* 07/25/2013   BNP No results found for this basename: PROBNP,  in the last 8760 hours TSH  Recent Labs  11/14/12 0605 04/20/13 1622  TSH 0.686 0.635   CHOLESTEROL  Recent Labs  11/21/12 1100  CHOL 206*    Scheduled Meds: . acetaminophen  1,000 mg Intravenous 4 times per day  . antiseptic oral rinse  15 mL Mouth Rinse BID  . aspirin EC  81 mg Oral Daily  . atorvastatin  80 mg Oral q1800  . budesonide-formoterol  2 puff Inhalation BID  . citalopram  10 mg Oral Daily  .  digoxin  0.25 mg Intravenous Daily  . LORazepam  0.5 mg Oral TID  . metoprolol      . metoprolol  2.5 mg Intravenous Once  . morphine      . morphine      . pantoprazole  40 mg Oral QAC breakfast  . potassium chloride  40 mEq Oral BID  . sodium chloride  3 mL Intravenous Q12H  . vancomycin  500 mg Intravenous Q24H   Continuous Infusions: . diltiazem (CARDIZEM) infusion     PRN Meds:.acetaminophen, acetaminophen, ondansetron (ZOFRAN) IV, ondansetron  Assessment/Plan: Sinus tachycardia Fever Possible urosepsis with renal stone/obstruction/hydronephrosis COPD Arthritis H/O stroke H/O skin cancer Dehydration H/O seizures H/O depression Hypokalemia  Agree with fluid bolus and potassium replacement. May use diltiazem or lanoxin for negative chronotropism post fluids and potassium replacement. Avoid B-blocker due to advanced COPD. Oxygen as needed   LOS: 1 day    Dixie Dials  MD  07/26/2013, 6:20 PM

## 2013-07-26 NOTE — Progress Notes (Signed)
[facility] PROGRESS NOTE  Bridget Ball JHE:[id number] DOB: 12/04/1928 DOA: 07/25/2013 PCP: Delphina Cahill, MD  Assessment/Plan: 1-Acute Respiratory Failure; Differential heart failure, PNA, COPD; stat Chest x ray, abg, IV lasix, Morphine for pain, treatment for fever. Patient was started on NB mask. CCM consulted to help with respiratory failure.   2-Fever; IV tylenol. Blood culture, chest x ray, start empirically antibiotics to cover for intra-abdominal infection, PNA. Check UA, urine culture. CT scan on CT 21 didn't showed colitis.   Tachycardia; sinus vs SVT; Patient was also complaining of chest pain. Fever might explain tachycardia. Tylenol. If tachycardia persit will need to consider CT angio/vs V-Q scan.    -Left-sided UPJ obstruction with resulting severe hydronephrosis of unknown duration;  Urology  Inform of patient deterioration.   -Diarrhea; no BM since admission. Abdominal distension, will Check KUB.     Code Status: DNR.  Family Communication: Care discussed with Daughter and son.  Disposition Plan: Transfer to Step Down Unit.    Consultants:  CCM  Cardiology  Procedures:  none  Antibiotics:  Vancomycin 7-22  Levaquin 7-22  Flagyl 7-22  HPI/Subjective: Patient in respiratory distress, complaining of chest pain, back pain, SOB.   Objective: Filed Vitals:   07/26/13 1756  BP:   Pulse:   Temp: 102.3 F (39.1 C)  Resp:     Intake/Output Summary (Last 24 hours) at 07/26/13 1822 Last data filed at 07/26/13 1227  Gross per 24 hour  Intake 1516.67 ml  Output    300 ml  Net 1216.67 ml   Filed Weights   07/25/13 1132 07/26/13 0500  Weight: 46.409 kg (102 lb 5 oz) 46.3 kg (102 lb 1.2 oz)    Exam:   General:  Patient in respiratory Distress. Ussing accessory muscle to breath.   Cardiovascular: S 1, S 2 Tachycardic, RR. Positive JVD.   Respiratory: Bilaterally decreases breath sounds, bilateral expiratory wheezing and crackles.    Abdomen: BS present, soft, Distended, NT.   Musculoskeletal: Trace edema.   Data Reviewed: Basic Metabolic Panel:  Recent Labs Lab 07/21/13 1745 07/25/13 1225 07/26/13 0505  NA 137 135* 137  K 3.6* 3.5* 3.1*  CL 99 96 100  CO2 26 27 26   GLUCOSE 104* 106* 91  BUN 14 38* 31*  CREATININE 0.82 1.70* 1.36*  CALCIUM 8.9 8.2* 7.8*   Liver Function Tests:  Recent Labs Lab 07/21/13 1745 07/25/13 1225 07/26/13 0505  AST 16 79* 40*  ALT 16 62* 48*  ALKPHOS 43 63 76  BILITOT 0.6 0.4 0.3  PROT 6.2 5.9* 5.3*  ALBUMIN 3.8 2.6* 2.2*    Recent Labs Lab 07/25/13 1225  LIPASE 12   No results found for this basename: AMMONIA,  in the last 168 hours CBC:  Recent Labs Lab 07/21/13 1745 07/25/13 1225 07/26/13 0505  WBC 10.5 13.9* 13.3*  NEUTROABS 9.1* 13.3*  --   HGB 13.1 12.3 10.9*  HCT 39.3 35.9* 32.6*  MCV 96.3 94.2 95.0  PLT 265 88* 80*   Cardiac Enzymes:  Recent Labs Lab 07/25/13 1225 07/25/13 1514 07/25/13 1900 07/25/13 2333 07/26/13 0505  TROPONINI 0.48* 0.54* 0.43* <0.30 <0.30   BNP (last 3 results) No results found for this basename: PROBNP,  in the last 8760 hours CBG: No results found for this basename: GLUCAP,  in the last 168 hours  Recent Results (from the past 240 hour(s))  MRSA PCR SCREENING     Status: None   Collection Time    07/25/13  6:41 PM      Result Value Ref Range Status   MRSA by PCR NEGATIVE  NEGATIVE Final   Comment:            The GeneXpert MRSA Assay (FDA     approved for NASAL specimens     only), is one component of a     comprehensive MRSA colonization     surveillance program. It is not     intended to diagnose MRSA     infection nor to guide or     monitor treatment for     MRSA infections.     Studies: Ct Abdomen Pelvis Wo Contrast  07/25/2013   CLINICAL DATA:  Nausea, vomiting and diarrhea.  EXAM: CT ABDOMEN AND PELVIS WITHOUT CONTRAST  TECHNIQUE: Multidetector CT imaging of the abdomen and pelvis was  performed following the standard protocol without IV contrast.  COMPARISON:  07/21/2013.  FINDINGS: The lung bases are stable.  Patchy bibasilar atelectasis.  The liver is grossly normal and stable. Small cysts are again demonstrated. Cholelithiasis is noted. The spleen is normal in size. No focal lesions. The pancreas is grossly normal.  Severe left-sided hydronephrosis due to a chronic UPJ obstruction. No obstructing ureteral calculus. There is persistent contrast in the left kidney from the recent CT scan. The right kidney is normal.  The bladder is unremarkable. Uterus and ovaries are unremarkable and stable. No mesenteric or retroperitoneal mass or adenopathy. Stable scattered lymph nodes. Stable advanced atherosclerotic calcifications involving the aorta and branch vessels.  No pelvic mass or adenopathy.  No inguinal mass or adenopathy.  The bony structures are stable.  IMPRESSION: Stable left-sided UPJ obstruction with severe hydronephrosis.  Chololithiasis.  Bibasilar atelectasis.  Stable hepatic cysts.   Electronically Signed   By: Kalman Jewels M.D.   On: 07/25/2013 14:20   Dg Chest 2 View  07/25/2013   CLINICAL DATA:  Severe abdominal pain  EXAM: CHEST  2 VIEW  COMPARISON:  None.  FINDINGS: The lungs are hyperinflated likely secondary to COPD. There is no focal parenchymal opacity, pleural effusion, or pneumothorax. The heart and mediastinal contours are unremarkable. There is thoracic aortic atherosclerosis.  The osseous structures are unremarkable.  IMPRESSION: No active cardiopulmonary disease.   Electronically Signed   By: Kathreen Devoid   On: 07/25/2013 13:51   Dg Chest Port 1 View  07/26/2013   CLINICAL DATA:  Shortness of breath with chest pain and tachycardia. Fever. History of COPD and seizures.  EXAM: PORTABLE CHEST - 1 VIEW  COMPARISON:  Radiographs 07/25/2013 and 07/08/2013.  FINDINGS: 1751 hr. The heart size and mediastinal contours are stable. Accentuated interstitial markings compared  with the prior studies may be related to portable technique, although mild superimposed edema is difficult to exclude. There is no airspace disease, pleural effusion or pneumothorax. The osseous structures appear unchanged.  IMPRESSION: Mild accentuation of interstitial markings, likely technical. No definite acute findings.   Electronically Signed   By: Camie Patience M.D.   On: 07/26/2013 18:06    Scheduled Meds: . acetaminophen  1,000 mg Intravenous 4 times per day  . antiseptic oral rinse  15 mL Mouth Rinse BID  . aspirin EC  81 mg Oral Daily  . atorvastatin  80 mg Oral q1800  . budesonide-formoterol  2 puff Inhalation BID  . citalopram  10 mg Oral Daily  . digoxin  0.25 mg Intravenous Daily  . LORazepam  0.5 mg Oral TID  . metoprolol      .  metoprolol  2.5 mg Intravenous Once  . morphine      . morphine      . pantoprazole  40 mg Oral QAC breakfast  . potassium chloride  40 mEq Oral BID  . sodium chloride  3 mL Intravenous Q12H  . vancomycin  500 mg Intravenous Q24H   Continuous Infusions: . diltiazem (CARDIZEM) infusion      Principal Problem:   AKI (acute kidney injury) Active Problems:   Diarrhea   Abdominal pain, unspecified site   Hydronephrosis, left   Dehydration   Elevated troponin    Time spent: 35 minutes.     Niel Hummer A  [facility] Pager [phone]. If 7PM-7AM, please contact night-coverage at [email or web address], password [facility] 07/26/2013, 6:22 PM  LOS: 1 day

## 2013-07-26 NOTE — Progress Notes (Signed)
  Echocardiogram 2D Echocardiogram has been performed.  [place], Makena 07/26/2013, 11:23 AM

## 2013-07-26 NOTE — Progress Notes (Signed)
I spoke w/ Dr. Darrick Meigs this morning.I would suggest that when the patient is stable and adequately hydrated, she be transferred to [facility].  At that time, we can consult and consider/schedule her cystoscopy and retrograde as well as possible stent placement.

## 2013-07-26 NOTE — Progress Notes (Signed)
Patient taken to Interventional Radiology for Left nephrostomy placement. Time out at 22:36. Received Versed 0.5mg  and Fentanyl 5mcg IV. Patient awake or easily awakened during procedure. End time 22:47. Taken back to 1239 at 2300.

## 2013-07-26 NOTE — Progress Notes (Signed)
[place] Progress Note Patient Name: GWEN SARVIS DOB: 11/04/1928 MRN: [id number]  Date of Service  07/26/2013   HPI/Events of Note   Hypotension, sepsis To go for perc nephrostomy  eICU Interventions  Saline bolus now Neo gtt    Intervention Category Major Interventions: Hypotension - evaluation and management  Mahlik Lenn 07/26/2013, 9:20 PM

## 2013-07-27 ENCOUNTER — Inpatient Hospital Stay (HOSPITAL_COMMUNITY): Payer: Medicare Other

## 2013-07-27 DIAGNOSIS — R7881 Bacteremia: Secondary | ICD-10-CM

## 2013-07-27 DIAGNOSIS — K5289 Other specified noninfective gastroenteritis and colitis: Secondary | ICD-10-CM

## 2013-07-27 DIAGNOSIS — R6520 Severe sepsis without septic shock: Secondary | ICD-10-CM

## 2013-07-27 DIAGNOSIS — B9689 Other specified bacterial agents as the cause of diseases classified elsewhere: Secondary | ICD-10-CM

## 2013-07-27 DIAGNOSIS — A419 Sepsis, unspecified organism: Secondary | ICD-10-CM

## 2013-07-27 DIAGNOSIS — R6521 Severe sepsis with septic shock: Secondary | ICD-10-CM

## 2013-07-27 LAB — URINALYSIS, ROUTINE W REFLEX MICROSCOPIC
BILIRUBIN URINE: NEGATIVE
GLUCOSE, UA: NEGATIVE mg/dL
Ketones, ur: NEGATIVE mg/dL
Nitrite: NEGATIVE
PH: 8 (ref 5.0–8.0)
Protein, ur: >300 mg/dL — AB
Specific Gravity, Urine: 1.028 (ref 1.005–1.030)
Urobilinogen, UA: 0.2 mg/dL (ref 0.0–1.0)

## 2013-07-27 LAB — COMPREHENSIVE METABOLIC PANEL
ALBUMIN: 1.8 g/dL — AB (ref 3.5–5.2)
ALT: 49 U/L — ABNORMAL HIGH (ref 0–35)
AST: 62 U/L — ABNORMAL HIGH (ref 0–37)
Alkaline Phosphatase: 174 U/L — ABNORMAL HIGH (ref 39–117)
Anion gap: 16 — ABNORMAL HIGH (ref 5–15)
BILIRUBIN TOTAL: 0.7 mg/dL (ref 0.3–1.2)
BUN: 27 mg/dL — ABNORMAL HIGH (ref 6–23)
CALCIUM: 7.8 mg/dL — AB (ref 8.4–10.5)
CHLORIDE: 101 meq/L (ref 96–112)
CO2: 18 mEq/L — ABNORMAL LOW (ref 19–32)
Creatinine, Ser: 1.21 mg/dL — ABNORMAL HIGH (ref 0.50–1.10)
GFR calc Af Amer: 47 mL/min — ABNORMAL LOW (ref >90)
GFR calc non Af Amer: 41 mL/min — ABNORMAL LOW (ref >90)
Glucose, Bld: 177 mg/dL — ABNORMAL HIGH (ref 70–99)
Potassium: 3.5 mEq/L — ABNORMAL LOW (ref 3.7–5.3)
Sodium: 135 mEq/L — ABNORMAL LOW (ref 137–147)
Total Protein: 4.9 g/dL — ABNORMAL LOW (ref 6.0–8.3)

## 2013-07-27 LAB — CBC
HEMATOCRIT: 33.1 % — AB (ref 36.0–46.0)
Hemoglobin: 11.2 g/dL — ABNORMAL LOW (ref 12.0–15.0)
MCH: 32.1 pg (ref 26.0–34.0)
MCHC: 33.8 g/dL (ref 30.0–36.0)
MCV: 94.8 fL (ref 78.0–100.0)
Platelets: 42 10*3/uL — ABNORMAL LOW (ref 150–400)
RBC: 3.49 MIL/uL — AB (ref 3.87–5.11)
RDW: 15.6 % — ABNORMAL HIGH (ref 11.5–15.5)
WBC: 20.4 10*3/uL — AB (ref 4.0–10.5)

## 2013-07-27 LAB — MAGNESIUM: Magnesium: 2.4 mg/dL (ref 1.5–2.5)

## 2013-07-27 LAB — URINE MICROSCOPIC-ADD ON

## 2013-07-27 LAB — TROPONIN I
Troponin I: 0.39 ng/mL (ref <0.30)
Troponin I: <0.3 ng/mL (ref <0.30)

## 2013-07-27 LAB — PHOSPHORUS: PHOSPHORUS: 2.1 mg/dL — AB (ref 2.3–4.6)

## 2013-07-27 MED ORDER — VANCOMYCIN HCL 500 MG IV SOLR
500.0000 mg | INTRAVENOUS | Status: DC
  Administered 2013-07-27 – 2013-07-28 (×2): 500 mg via INTRAVENOUS
  Filled 2013-07-27 (×3): qty 500

## 2013-07-27 MED ORDER — POTASSIUM CHLORIDE 10 MEQ/100ML IV SOLN
10.0000 meq | INTRAVENOUS | Status: AC
  Administered 2013-07-27: 10 meq via INTRAVENOUS
  Filled 2013-07-27: qty 100

## 2013-07-27 MED ORDER — NYSTATIN 100000 UNIT/ML MT SUSP
5.0000 mL | Freq: Four times a day (QID) | OROMUCOSAL | Status: DC
  Administered 2013-07-27 – 2013-08-03 (×25): 500000 [IU] via ORAL
  Filled 2013-07-27 (×30): qty 5

## 2013-07-27 MED ORDER — SODIUM CHLORIDE 0.9 % IV BOLUS (SEPSIS)
1000.0000 mL | Freq: Once | INTRAVENOUS | Status: AC
  Administered 2013-07-27: 1000 mL via INTRAVENOUS

## 2013-07-27 MED ORDER — LEVOFLOXACIN IN D5W 500 MG/100ML IV SOLN
500.0000 mg | INTRAVENOUS | Status: DC
  Filled 2013-07-27: qty 100

## 2013-07-27 MED ORDER — SODIUM CHLORIDE 0.9 % IV SOLN
INTRAVENOUS | Status: DC
  Administered 2013-07-27 – 2013-08-01 (×3): via INTRAVENOUS

## 2013-07-27 MED ORDER — PHENYLEPHRINE HCL 10 MG/ML IJ SOLN
30.0000 ug/min | INTRAMUSCULAR | Status: DC
  Administered 2013-07-27: 65 ug/min via INTRAVENOUS
  Administered 2013-07-27: 100 ug/min via INTRAVENOUS
  Filled 2013-07-27 (×3): qty 4

## 2013-07-27 MED ORDER — LEVOFLOXACIN IN D5W 750 MG/150ML IV SOLN
750.0000 mg | Freq: Once | INTRAVENOUS | Status: AC
  Administered 2013-07-27: 750 mg via INTRAVENOUS
  Filled 2013-07-27: qty 150

## 2013-07-27 MED ORDER — POTASSIUM CHLORIDE 10 MEQ/100ML IV SOLN
10.0000 meq | Freq: Once | INTRAVENOUS | Status: AC
  Administered 2013-07-27: 10 meq via INTRAVENOUS
  Filled 2013-07-27: qty 100

## 2013-07-27 MED ORDER — POTASSIUM CHLORIDE 10 MEQ/100ML IV SOLN
10.0000 meq | INTRAVENOUS | Status: DC
  Administered 2013-07-27: 10 meq via INTRAVENOUS
  Filled 2013-07-27: qty 100

## 2013-07-27 NOTE — Progress Notes (Signed)
CRITICAL VALUE ALERT  Critical value received:  Troponin 0.75  Date of notification:  07/26/2013  Time of notification:  2145  Critical value read back:Yes.    Nurse who received alert:  Gladys Damme, RN  MD notified (1st page):  [place]  Time of first page:  2145  MD notified (2nd page):  Time of second page:  Responding MD:  Dyann Kief  Time MD responded:  2150

## 2013-07-27 NOTE — Progress Notes (Signed)
Subjective: Pt feeling a little better since PCN placed 7/22  Objective: Vital signs in last 24 hours: Temp:  [97.7 F (36.5 C)-104.4 F (40.2 C)] 97.7 F (36.5 C) (07/23 0800) Pulse Rate:  [30-163] 67 (07/23 0800) Resp:  [13-32] 18 (07/23 0800) BP: (53-158)/(35-86) 138/86 mmHg (07/23 0800) SpO2:  [85 %-100 %] 95 % (07/23 0800) Last BM Date: 07/25/13  Intake/Output from previous day: 07/22 0701 - 07/23 0700 In: 3969.8 [P.O.:480; I.V.:979.8; IV Piggyback:1500] Out: 760 [Urine:760] Intake/Output this shift: Total I/O In: 555 [Other:405; IV Piggyback:150] Out: 60 [Urine:60]  Left PCN intact, dressing dry, mildly tender; output 60+ cc's turbid, blood-tinged fluid; drain flushed with 10 cc's sterile NS; cx's pending; creat 1.21(1.33)  Lab Results:   Recent Labs  07/26/13 1830 07/27/13 0535  WBC 3.1* 20.4*  HGB 10.8* 11.2*  HCT 32.4* 33.1*  PLT PLATELET CLUMPS NOTED ON SMEAR, COUNT APPEARS DECREASED 42*   BMET  Recent Labs  07/26/13 1830 07/27/13 0535  NA 138 135*  K 2.9* 3.5*  CL 102 101  CO2 22 18*  GLUCOSE 85 177*  BUN 30* 27*  CREATININE 1.33* 1.21*  CALCIUM 7.7* 7.8*   PT/INR No results found for this basename: LABPROT, INR,  in the last 72 hours ABG  Recent Labs  07/26/13 1731  PHART 7.275*  HCO3 22.5    Studies/Results: Ct Abdomen Pelvis Wo Contrast  07/25/2013   CLINICAL DATA:  Nausea, vomiting and diarrhea.  EXAM: CT ABDOMEN AND PELVIS WITHOUT CONTRAST  TECHNIQUE: Multidetector CT imaging of the abdomen and pelvis was performed following the standard protocol without IV contrast.  COMPARISON:  07/21/2013.  FINDINGS: The lung bases are stable.  Patchy bibasilar atelectasis.  The liver is grossly normal and stable. Small cysts are again demonstrated. Cholelithiasis is noted. The spleen is normal in size. No focal lesions. The pancreas is grossly normal.  Severe left-sided hydronephrosis due to a chronic UPJ obstruction. No obstructing ureteral  calculus. There is persistent contrast in the left kidney from the recent CT scan. The right kidney is normal.  The bladder is unremarkable. Uterus and ovaries are unremarkable and stable. No mesenteric or retroperitoneal mass or adenopathy. Stable scattered lymph nodes. Stable advanced atherosclerotic calcifications involving the aorta and branch vessels.  No pelvic mass or adenopathy.  No inguinal mass or adenopathy.  The bony structures are stable.  IMPRESSION: Stable left-sided UPJ obstruction with severe hydronephrosis.  Chololithiasis.  Bibasilar atelectasis.  Stable hepatic cysts.   Electronically Signed   By: Kalman Jewels M.D.   On: 07/25/2013 14:20   Dg Chest 2 View  07/25/2013   CLINICAL DATA:  Severe abdominal pain  EXAM: CHEST  2 VIEW  COMPARISON:  None.  FINDINGS: The lungs are hyperinflated likely secondary to COPD. There is no focal parenchymal opacity, pleural effusion, or pneumothorax. The heart and mediastinal contours are unremarkable. There is thoracic aortic atherosclerosis.  The osseous structures are unremarkable.  IMPRESSION: No active cardiopulmonary disease.   Electronically Signed   By: Kathreen Devoid   On: 07/25/2013 13:51   Dg Abd 1 View  07/26/2013   CLINICAL DATA:  Abdominal distention.  EXAM: ABDOMEN - 1 VIEW  COMPARISON:  None.  FINDINGS: Supine abdomen shows gas scattered along a nondilated colon. There is no gaseous small bowel dilatation. Probe overlies the lower midline pelvis. Bones are demineralized with degenerative changes noted in the lower lumbar spine.  IMPRESSION: No evidence for bowel obstruction although there is diffuse gaseous filling of  the colon and a component of underlying mild colonic ileus would be a consideration.   Electronically Signed   By: Misty Stanley M.D.   On: 07/26/2013 19:06   Ir Perc Nephrostomy Left  07/27/2013   CLINICAL DATA:  Left hydronephrosis and sepsis.  EXAM: 1. ULTRASOUND GUIDANCE FOR PUNCTURE OF THE LEFT RENAL COLLECTING SYSTEM.  2. LEFT PERCUTANEOUS NEPHROSTOMY TUBE PLACEMENT.  COMPARISON:  CT of the abdomen on 07/25/2013.  ANESTHESIA/SEDATION: 0.5 mg IV Versed; 25 mcg IV Fentanyl.  Total Moderate Sedation Time  15 minutes  CONTRAST:  5 ml Omnipaque 300  MEDICATIONS: No additional medications.  FLUOROSCOPY TIME:  36 seconds.  PROCEDURE: The procedure, risks, benefits, and alternatives were explained to the patient's daughter. Questions regarding the procedure were encouraged and answered. The patient's daughter understands and consents to the procedure.  The left flank region was prepped with Betadine in a sterile fashion, and a sterile drape was applied covering the operative field. A sterile gown and sterile gloves were used for the procedure. Local anesthesia was provided with 1% Lidocaine.  Ultrasound was used to localize the left kidney. Under direct ultrasound guidance, an 18 gauge needle was advanced into the renal collecting system. Ultrasound image documentation was performed. Aspiration of urine sample was performed followed by contrast injection.  A guidewire was advanced into the collecting system through the needle. Percutaneous tract dilatation was then performed over the guidewire. A 10 French percutaneous nephrostomy tube was then advanced and formed in the collecting system. Catheter position was confirmed by fluoroscopy after contrast injection.  The catheter was secured at the skin with a Prolene retention suture and Stat-Lock device. A gravity bag was placed.  COMPLICATIONS: None.  FINDINGS: Ultrasound demonstrates severe left-sided hydronephrosis. Urine return was cloudy. A sample was sent for culture. The nephrostomy tube was formed at the level of the renal pelvis.  IMPRESSION: Placement of left percutaneous nephrostomy tube with 10 French tube advanced to the level of the renal pelvis. Urine return is cloudy and infected appearing. A sample of urine from the left kidney was sent for culture analysis.   Electronically  Signed   By: Aletta Edouard M.D.   On: 07/27/2013 07:37   Ir US Guide Vasc Access Left  07/27/2013   CLINICAL DATA:  Left hydronephrosis and sepsis.  EXAM: 1. ULTRASOUND GUIDANCE FOR PUNCTURE OF THE LEFT RENAL COLLECTING SYSTEM. 2. LEFT PERCUTANEOUS NEPHROSTOMY TUBE PLACEMENT.  COMPARISON:  CT of the abdomen on 07/25/2013.  ANESTHESIA/SEDATION: 0.5 mg IV Versed; 25 mcg IV Fentanyl.  Total Moderate Sedation Time  15 minutes  CONTRAST:  5 ml Omnipaque 300  MEDICATIONS: No additional medications.  FLUOROSCOPY TIME:  36 seconds.  PROCEDURE: The procedure, risks, benefits, and alternatives were explained to the patient's daughter. Questions regarding the procedure were encouraged and answered. The patient's daughter understands and consents to the procedure.  The left flank region was prepped with Betadine in a sterile fashion, and a sterile drape was applied covering the operative field. A sterile gown and sterile gloves were used for the procedure. Local anesthesia was provided with 1% Lidocaine.  Ultrasound was used to localize the left kidney. Under direct ultrasound guidance, an 18 gauge needle was advanced into the renal collecting system. Ultrasound image documentation was performed. Aspiration of urine sample was performed followed by contrast injection.  A guidewire was advanced into the collecting system through the needle. Percutaneous tract dilatation was then performed over the guidewire. A 10 French percutaneous nephrostomy tube was  then advanced and formed in the collecting system. Catheter position was confirmed by fluoroscopy after contrast injection.  The catheter was secured at the skin with a Prolene retention suture and Stat-Lock device. A gravity bag was placed.  COMPLICATIONS: None.  FINDINGS: Ultrasound demonstrates severe left-sided hydronephrosis. Urine return was cloudy. A sample was sent for culture. The nephrostomy tube was formed at the level of the renal pelvis.  IMPRESSION: Placement  of left percutaneous nephrostomy tube with 10 French tube advanced to the level of the renal pelvis. Urine return is cloudy and infected appearing. A sample of urine from the left kidney was sent for culture analysis.   Electronically Signed   By: Aletta Edouard M.D.   On: 07/27/2013 07:37   Dg Chest Port 1 View  07/27/2013   CLINICAL DATA:  Worsening shortness of breath.  EXAM: PORTABLE CHEST - 1 VIEW  COMPARISON:  Chest radiograph performed 07/26/2013  FINDINGS: The lungs are well-aerated. Vascular congestion is noted, with peribronchial thickening. Mildly increased interstitial markings are again seen, possibly reflecting minimal interstitial edema. No pleural effusion or pneumothorax is identified.  The cardiomediastinal silhouette is within normal limits. No acute osseous abnormalities are seen.  IMPRESSION: Vascular congestion, with peribronchial thickening. Mildly increased interstitial markings may reflect minimal interstitial edema.   Electronically Signed   By: Garald Balding M.D.   On: 07/27/2013 00:54   Dg Chest Port 1 View  07/26/2013   CLINICAL DATA:  Shortness of breath with chest pain and tachycardia. Fever. History of COPD and seizures.  EXAM: PORTABLE CHEST - 1 VIEW  COMPARISON:  Radiographs 07/25/2013 and 07/08/2013.  FINDINGS: 1751 hr. The heart size and mediastinal contours are stable. Accentuated interstitial markings compared with the prior studies may be related to portable technique, although mild superimposed edema is difficult to exclude. There is no airspace disease, pleural effusion or pneumothorax. The osseous structures appear unchanged.  IMPRESSION: Mild accentuation of interstitial markings, likely technical. No definite acute findings.   Electronically Signed   By: Camie Patience M.D.   On: 07/26/2013 18:06    Anti-infectives: Anti-infectives   Start     Dose/Rate Route Frequency Ordered Stop   07/29/13 0800  levofloxacin (LEVAQUIN) IVPB 500 mg     500 mg 100 mL/hr over  60 Minutes Intravenous Every 48 hours 07/27/13 0748     07/27/13 0900  levofloxacin (LEVAQUIN) IVPB 750 mg     750 mg 100 mL/hr over 90 Minutes Intravenous  Once 07/27/13 0748 07/27/13 1055   07/27/13 0600  aztreonam (AZACTAM) 1 g in dextrose 5 % 50 mL IVPB     1 g 100 mL/hr over 30 Minutes Intravenous 3 times per day 07/26/13 1938     07/27/13 0200  metroNIDAZOLE (FLAGYL) IVPB 500 mg  Status:  Discontinued     500 mg 100 mL/hr over 60 Minutes Intravenous Every 8 hours 07/26/13 1822 07/26/13 1922   07/26/13 1945  aztreonam (AZACTAM) 2 g in dextrose 5 % 50 mL IVPB     2 g 100 mL/hr over 30 Minutes Intravenous NOW 07/26/13 1932 07/26/13 2148   07/26/13 1830  vancomycin (VANCOCIN) 500 mg in sodium chloride 0.9 % 100 mL IVPB  Status:  Discontinued     500 mg 100 mL/hr over 60 Minutes Intravenous Every 24 hours 07/26/13 1817 07/27/13 1038   07/26/13 1830  metroNIDAZOLE (FLAGYL) IVPB 500 mg  Status:  Discontinued     500 mg 100 mL/hr over 60 Minutes Intravenous  Once 07/26/13 1822 07/26/13 1922   07/26/13 1830  levofloxacin (LEVAQUIN) IVPB 750 mg  Status:  Discontinued     750 mg 100 mL/hr over 90 Minutes Intravenous Every 48 hours 07/26/13 1823 07/26/13 1930      Assessment/Plan: s/p left PCN 7/22 secondary to hydro/sepsis; cont current tx, monitor output /labs; check final cx's; c diff pending  LOS: 2 days    Ytzel Gubler,D [facility] 07/27/2013

## 2013-07-27 NOTE — Progress Notes (Signed)
Patient ID: Bridget Ball, female   DOB: October 14, 1928, 80 y.o.   MRN: [id number]    Subjective: Pt s/p left percutaneous nephrostomy drainage last night. Purulent urine returned per Dr. Margaretmary Dys note. Cultures sent.  She has been hemodynamically stable as of this morning.   Objective: Vital signs in last 24 hours: Temp:  [97.7 F (36.5 C)-104.4 F (40.2 C)] 97.7 F (36.5 C) (07/23 0800) Pulse Rate:  [30-163] 67 (07/23 0800) Resp:  [13-32] 18 (07/23 0800) BP: (53-158)/(35-86) 138/86 mmHg (07/23 0800) SpO2:  [85 %-100 %] 95 % (07/23 0800)  Intake/Output from previous day: 07/22 0701 - 07/23 0700 In: 3969.8 [P.O.:480; I.V.:979.8; IV Piggyback:1500] Out: 760 [Urine:760] Intake/Output this shift: Total I/O In: 10 [Other:10] Out: -   Physical Exam:  General: Alert and oriented GU: L PCN draining well  Lab Results:  Recent Labs  07/26/13 0505 07/26/13 1830 07/27/13 0535  HGB 10.9* 10.8* 11.2*  HCT 32.6* 32.4* 33.1*   BMET  Recent Labs  07/26/13 1830 07/27/13 0535  NA 138 135*  K 2.9* 3.5*  CL 102 101  CO2 22 18*  GLUCOSE 85 177*  BUN 30* 27*  CREATININE 1.33* 1.21*  CALCIUM 7.7* 7.8*     Studies/Results: Ct Abdomen Pelvis Wo Contrast  07/25/2013   CLINICAL DATA:  Nausea, vomiting and diarrhea.  EXAM: CT ABDOMEN AND PELVIS WITHOUT CONTRAST  TECHNIQUE: Multidetector CT imaging of the abdomen and pelvis was performed following the standard protocol without IV contrast.  COMPARISON:  07/21/2013.  FINDINGS: The lung bases are stable.  Patchy bibasilar atelectasis.  The liver is grossly normal and stable. Small cysts are again demonstrated. Cholelithiasis is noted. The spleen is normal in size. No focal lesions. The pancreas is grossly normal.  Severe left-sided hydronephrosis due to a chronic UPJ obstruction. No obstructing ureteral calculus. There is persistent contrast in the left kidney from the recent CT scan. The right kidney is normal.  The bladder is unremarkable.  Uterus and ovaries are unremarkable and stable. No mesenteric or retroperitoneal mass or adenopathy. Stable scattered lymph nodes. Stable advanced atherosclerotic calcifications involving the aorta and branch vessels.  No pelvic mass or adenopathy.  No inguinal mass or adenopathy.  The bony structures are stable.  IMPRESSION: Stable left-sided UPJ obstruction with severe hydronephrosis.  Chololithiasis.  Bibasilar atelectasis.  Stable hepatic cysts.   Electronically Signed   By: Kalman Jewels M.D.   On: 07/25/2013 14:20   Dg Chest 2 View   Ir Perc Nephrostomy Left  07/27/2013   CLINICAL DATA:  Left hydronephrosis and sepsis.  EXAM: 1. ULTRASOUND GUIDANCE FOR PUNCTURE OF THE LEFT RENAL COLLECTING SYSTEM. 2. LEFT PERCUTANEOUS NEPHROSTOMY TUBE PLACEMENT.  COMPARISON:  CT of the abdomen on 07/25/2013.  ANESTHESIA/SEDATION: 0.5 mg IV Versed; 25 mcg IV Fentanyl.  Total Moderate Sedation Time  15 minutes  CONTRAST:  5 ml Omnipaque 300  MEDICATIONS: No additional medications.  FLUOROSCOPY TIME:  36 seconds.  PROCEDURE: The procedure, risks, benefits, and alternatives were explained to the patient's daughter. Questions regarding the procedure were encouraged and answered. The patient's daughter understands and consents to the procedure.  The left flank region was prepped with Betadine in a sterile fashion, and a sterile drape was applied covering the operative field. A sterile gown and sterile gloves were used for the procedure. Local anesthesia was provided with 1% Lidocaine.  Ultrasound was used to localize the left kidney. Under direct ultrasound guidance, an 18 gauge needle was advanced into the renal  collecting system. Ultrasound image documentation was performed. Aspiration of urine sample was performed followed by contrast injection.  A guidewire was advanced into the collecting system through the needle. Percutaneous tract dilatation was then performed over the guidewire. A 10 French percutaneous nephrostomy  tube was then advanced and formed in the collecting system. Catheter position was confirmed by fluoroscopy after contrast injection.  The catheter was secured at the skin with a Prolene retention suture and Stat-Lock device. A gravity bag was placed.  COMPLICATIONS: None.  FINDINGS: Ultrasound demonstrates severe left-sided hydronephrosis. Urine return was cloudy. A sample was sent for culture. The nephrostomy tube was formed at the level of the renal pelvis.  IMPRESSION: Placement of left percutaneous nephrostomy tube with 10 French tube advanced to the level of the renal pelvis. Urine return is cloudy and infected appearing. A sample of urine from the left kidney was sent for culture analysis.   Electronically Signed   By: Aletta Edouard M.D.   On: 07/27/2013 07:37       Assessment/Plan: Left ureteral obstruction and infection: Pt is improving following left renal drainage.  Continue broad spectrum IV antibiotics pending culture results.   LOS: 2 days   Adeyemi Hamad,LES 07/27/2013, 9:26 AM

## 2013-07-27 NOTE — Progress Notes (Signed)
PULMONARY / CRITICAL CARE MEDICINE   Name: Bridget Ball MRN: [id number] DOB: 1928/02/07    ADMISSION DATE:  07/25/2013 CONSULTATION DATE:  07/26/2013  REFERRING MD :  Tyrell Antonio  CHIEF COMPLAINT:  Respiratory Distress  INITIAL PRESENTATION: 80 y.o.f who presented to [facility]  On 7/21 w/ cc: abdominal pain, diarrhea, poor oral intake. Dx eval c/w left hydro. Transferred to [facility] from [facility] for consideration of perc drainage.  On 7/22,  She was transferred to ICU increased resp distress on 7/22 in setting of abd pain   STUDIES:  7/17 CT abdo >>> stable hepatic cysts, cholelithiasis without inflammation, new severe left hydronephrosis concerning for UPJ stenosis. 7/21 CT abdo >>> stable left sided UPJ obstruction with severe hydro, cholelithiasis, stable hepatic cysts.  SIGNIFICANT EVENTS: 7/17 - initial presentation to [facility] ED, discharged home. 7/21 - presented again to [facility] ED for same symptoms (abd pain) 7/22 - transferred to [facility] for urological evaluation,- moved to ICU for respiratory distress- hypotensive in setting of sepsis, treated w/ crystalloid resuscitation & neo infusion. Percutaneous nephrostomy tube placed, cloudy and bloody drainage. Trop I elevated, cards consulted for tachycardia.  7/23: pain resolved. Still hypotensive but weaning pressors.   SUBJECTIVE:  No distress  VITAL SIGNS: Temp:  [97.7 F (36.5 C)-104.4 F (40.2 C)] 97.7 F (36.5 C) (07/23 0800) Pulse Rate:  [30-163] 67 (07/23 0800) Resp:  [13-32] 18 (07/23 0800) BP: (53-158)/(35-86) 138/86 mmHg (07/23 0800) SpO2:  [85 %-100 %] 95 % (07/23 0800) Room air  INTAKE / OUTPUT:  Intake/Output Summary (Last 24 hours) at 07/27/13 0824 Last data filed at 07/27/13 0800  Gross per 24 hour  Intake 3979.83 ml  Output    760 ml  Net 3219.83 ml    PHYSICAL EXAMINATION: General: Chronically ill appearing female, in NAD. Neuro:sleepy this am. Arouses but drifts back to sleep w/out stimulation  HEENT: [place]/AT. PERRL, sclerae  anicteric. Cardiovascular: Tachy but regular, no M/R/G.  Lungs: Respirations shallow.  CTA, no W/R/R. Abdomen: BS x 4, soft, tender. + CVA tenderness on left, left Perc drain w/bloody rusty drainage  Musculoskeletal: No gross deformities, no edema.  Skin: Intact, warm, no rashes.   LABS:  CBC  Recent Labs Lab 07/26/13 0505 07/26/13 1830 07/27/13 0535  WBC 13.3* 3.1* 20.4*  HGB 10.9* 10.8* 11.2*  HCT 32.6* 32.4* 33.1*  PLT 80* PLATELET CLUMPS NOTED ON SMEAR, COUNT APPEARS DECREASED 42*   BMET  Recent Labs Lab 07/26/13 0505 07/26/13 1830 07/27/13 0535  NA 137 138 135*  K 3.1* 2.9* 3.5*  CL 100 102 101  CO2 26 22 18*  BUN 31* 30* 27*  CREATININE 1.36* 1.33* 1.21*  GLUCOSE 91 85 177*   Electrolytes  Recent Labs Lab 07/26/13 0505 07/26/13 1830 07/27/13 0535  CALCIUM 7.8* 7.7* 7.8*  MG  --   --  2.4  PHOS  --   --  2.1*   Sepsis Markers  Recent Labs Lab 07/25/13 1252 07/26/13 2000  LATICACIDVEN 1.6 2.3*   ABG  Recent Labs Lab 07/26/13 1731  PHART 7.275*  PCO2ART 52.2*  PO2ART 141.0*   Liver Enzymes  Recent Labs Lab 07/25/13 1225 07/26/13 0505 07/27/13 0535  AST 79* 40* 62*  ALT 62* 48* 49*  ALKPHOS 63 76 174*  BILITOT 0.4 0.3 0.7  ALBUMIN 2.6* 2.2* 1.8*   Cardiac Enzymes  Recent Labs Lab 07/26/13 1752 07/27/13 0114 07/27/13 0540  TROPONINI 0.75* 0.39* <0.30   Imaging Dg Abd 1 View  07/26/2013  CLINICAL DATA:  Abdominal distention.  EXAM: ABDOMEN - 1 VIEW  COMPARISON:  None.  FINDINGS: Supine abdomen shows gas scattered along a nondilated colon. There is no gaseous small bowel dilatation. Probe overlies the lower midline pelvis. Bones are demineralized with degenerative changes noted in the lower lumbar spine.  IMPRESSION: No evidence for bowel obstruction although there is diffuse gaseous filling of the colon and a component of underlying mild colonic ileus would be a consideration.   Electronically Signed   By: Misty Stanley M.D.    On: 07/26/2013 19:06   Ir Perc Nephrostomy Left  07/27/2013   CLINICAL DATA:  Left hydronephrosis and sepsis.  EXAM: 1. ULTRASOUND GUIDANCE FOR PUNCTURE OF THE LEFT RENAL COLLECTING SYSTEM. 2. LEFT PERCUTANEOUS NEPHROSTOMY TUBE PLACEMENT.  COMPARISON:  CT of the abdomen on 07/25/2013.  ANESTHESIA/SEDATION: 0.5 mg IV Versed; 25 mcg IV Fentanyl.  Total Moderate Sedation Time  15 minutes  CONTRAST:  5 ml Omnipaque 300  MEDICATIONS: No additional medications.  FLUOROSCOPY TIME:  36 seconds.  PROCEDURE: The procedure, risks, benefits, and alternatives were explained to the patient's daughter. Questions regarding the procedure were encouraged and answered. The patient's daughter understands and consents to the procedure.  The left flank region was prepped with Betadine in a sterile fashion, and a sterile drape was applied covering the operative field. A sterile gown and sterile gloves were used for the procedure. Local anesthesia was provided with 1% Lidocaine.  Ultrasound was used to localize the left kidney. Under direct ultrasound guidance, an 18 gauge needle was advanced into the renal collecting system. Ultrasound image documentation was performed. Aspiration of urine sample was performed followed by contrast injection.  A guidewire was advanced into the collecting system through the needle. Percutaneous tract dilatation was then performed over the guidewire. A 10 French percutaneous nephrostomy tube was then advanced and formed in the collecting system. Catheter position was confirmed by fluoroscopy after contrast injection.  The catheter was secured at the skin with a Prolene retention suture and Stat-Lock device. A gravity bag was placed.  COMPLICATIONS: None.  FINDINGS: Ultrasound demonstrates severe left-sided hydronephrosis. Urine return was cloudy. A sample was sent for culture. The nephrostomy tube was formed at the level of the renal pelvis.  IMPRESSION: Placement of left percutaneous nephrostomy tube  with 10 French tube advanced to the level of the renal pelvis. Urine return is cloudy and infected appearing. A sample of urine from the left kidney was sent for culture analysis.   Electronically Signed   By: Aletta Edouard M.D.   On: 07/27/2013 07:37   Ir US Guide Vasc Access Left  07/27/2013   CLINICAL DATA:  Left hydronephrosis and sepsis.  EXAM: 1. ULTRASOUND GUIDANCE FOR PUNCTURE OF THE LEFT RENAL COLLECTING SYSTEM. 2. LEFT PERCUTANEOUS NEPHROSTOMY TUBE PLACEMENT.  COMPARISON:  CT of the abdomen on 07/25/2013.  ANESTHESIA/SEDATION: 0.5 mg IV Versed; 25 mcg IV Fentanyl.  Total Moderate Sedation Time  15 minutes  CONTRAST:  5 ml Omnipaque 300  MEDICATIONS: No additional medications.  FLUOROSCOPY TIME:  36 seconds.  PROCEDURE: The procedure, risks, benefits, and alternatives were explained to the patient's daughter. Questions regarding the procedure were encouraged and answered. The patient's daughter understands and consents to the procedure.  The left flank region was prepped with Betadine in a sterile fashion, and a sterile drape was applied covering the operative field. A sterile gown and sterile gloves were used for the procedure. Local anesthesia was provided with 1% Lidocaine.  Ultrasound  was used to localize the left kidney. Under direct ultrasound guidance, an 18 gauge needle was advanced into the renal collecting system. Ultrasound image documentation was performed. Aspiration of urine sample was performed followed by contrast injection.  A guidewire was advanced into the collecting system through the needle. Percutaneous tract dilatation was then performed over the guidewire. A 10 French percutaneous nephrostomy tube was then advanced and formed in the collecting system. Catheter position was confirmed by fluoroscopy after contrast injection.  The catheter was secured at the skin with a Prolene retention suture and Stat-Lock device. A gravity bag was placed.  COMPLICATIONS: None.  FINDINGS:  Ultrasound demonstrates severe left-sided hydronephrosis. Urine return was cloudy. A sample was sent for culture. The nephrostomy tube was formed at the level of the renal pelvis.  IMPRESSION: Placement of left percutaneous nephrostomy tube with 10 French tube advanced to the level of the renal pelvis. Urine return is cloudy and infected appearing. A sample of urine from the left kidney was sent for culture analysis.   Electronically Signed   By: Aletta Edouard M.D.   On: 07/27/2013 07:37   Dg Chest Port 1 View  07/26/2013   CLINICAL DATA:  Shortness of breath with chest pain and tachycardia. Fever. History of COPD and seizures.  EXAM: PORTABLE CHEST - 1 VIEW  COMPARISON:  Radiographs 07/25/2013 and 07/08/2013.  FINDINGS: 1751 hr. The heart size and mediastinal contours are stable. Accentuated interstitial markings compared with the prior studies may be related to portable technique, although mild superimposed edema is difficult to exclude. There is no airspace disease, pleural effusion or pneumothorax. The osseous structures appear unchanged.  IMPRESSION: Mild accentuation of interstitial markings, likely technical. No definite acute findings.   Electronically Signed   By: Camie Patience M.D.   On: 07/26/2013 18:06     ASSESSMENT / PLAN:  PULMONARY A: Acute respiratory failure - mostly d/t abd pain. Now resolved and on room air COPD DNI status P:   Supplemental O2 to maintain SpO2 > 92%. Cont  budesonide / brovana BID, when more awake can resume symbicort  Monitor for deterioration.  CARDIOVASCULAR A:  CHF Septic shock  Baseline Hypotension - note pt states she normally runs low BP, SBP roughly in high 90's. Suspect that her hypotension was c/b by CCB.  Mild troponin elevation, suspect element of demand ischemia  DNR / DNI Status. P:  D/c ccb Cont IVFs Wean pressors for MAP goal >50 or SBP >90  Cont tele Further recs per cards  RENAL A:   Left UPJ obstruction with  hydronephrosis Hypokalemia AKI - likely from dehydration and hydro. Mild AG acidosis  P:   K replacement. CMP in AM. Cont IVFs Avoid hypotension   GASTROINTESTINAL A:  GERD Abdominal pain -suspect From UPJ obstruction/hydro, as now resolved  Diarrhea prior to admit - per RN, pt has not had any diarrhea since admission. P:   NPO. Pantoprazole. Repeat lactate.  HEMATOLOGIC A:   Mild anemia P:  Transfuse for Hgb < 7. CBC in AM.  INFECTIOUS A:   SIRS/sepsis - UT source: pyelo as source with hydro/UPJ obstruction.  S/p Left Perc nephrostomy tube 7/22 P:   Blood Cx x2 7/22>> GNR >> UC 7/22>>> C.Diff PCR 7/22>>> Abx: Aztreonam, start date 7/22, day 2/x Abx: levaquin start date 7/22, day 2/x D/c vancomycin 7/22 Will narrow above as soon as culture data back   ENDOCRINE A:  Chronic pancreatitis with stable stone - per EDP notes, scheduled to  be removed in 2 weeks at [facility]. P:   - No intervention required.  NEUROLOGIC A:  No acute issues P:   - trend glucose   TODAY'S SUMMARY:  50 yof w/ UT sepsis/septic shock in setting of hydronephrosis. Now s/p left perc drain. Looks better. Pain resolved. resp distress resolved. Remains on pressors but BL SBP in 90s,. We will cont IVFs, stop CCB, cont current abx as we await cultures. When culture data complete we can narrow abx. Suspect we will get her off pressors today.     Note that pt is DNR / DNI status  07/27/2013, 8:24 AM   Reviewed above, examined, and agree.  80 yo female with septic shock from Lt hydroneprhosis.  She has GNR in blood cx.  Hemodynamics improving, and likely will be able to wean off pressors soon.  Continue aztreonam, levaquin pending cx results.    Family updated at bedside.  CC time 35 minutes.  Chesley Mires, MD [facility] Pulmonary/Critical Care 07/27/2013, 10:40 AM Pager:  [phone] After 3pm call: [phone]

## 2013-07-27 NOTE — Progress Notes (Signed)
[facility] PROGRESS NOTE  NOVEMBER SYPHER GLO:[id number] DOB: 06-29-1928 DOA: 07/25/2013 PCP: Delphina Cahill, MD  Assessment/Plan: 80 y/o female with PMH of advanced COPD, smoker, anxiety presented to [facility] On 7/21 w/ cc: abdominal pain, diarrhea, poor oral intake and diagnosed with left hydro.  -Transferred to [facility] from [facility] for consideration of perc drainage. On 7/22, She was transferred to ICU increased resp distress, hypotension  1. Sepsis/fever/UTI/septic shock on pressor-weaning  -cont IV atx, pend cultures; IVF, pressor as needed   2. Acute respiratory failure CXR: o clear infiltrates, but some edema  -resolved; on RA; cont oxygen, inhalers as needed; stop smoking  3. COPD. Cont as above 4. Left UPJ obstruction with hydronephrosis -7/22 : s/p left percutaneous nephrostomy; cont per urology  5. Mild troponin elevation, suspect element of demand ischemia; no chest pain -echo: Systolic function was normal. The estimated ejection fraction was in the range of 18% to 84%; diastolic dysfunction  6. Thrombocytopenia likely sepsis related vs procedure IRL   -no s/s of bleeding; recheck CBC in AM: hold heparin; HIT antbd 7. Tachycardia likely sepsis related; d/c dig; Cardizem as needed if BP allows    DVT prophylaxis; start SCD; hold heparin due to thrombocytopenia   Code Status: DNR Family Communication: patient, nurse; called Hallum,[name] Daughter [phone] no answer; try later  (indicate person spoken with, relationship, and if by phone, the number) Disposition Plan: pend clinical improvement    Consultants:  Urology, critical care   Procedures: 7/22 : s/p left percutaneous nephrostomy  7/17 CT abdo >>> stable hepatic cysts, cholelithiasis without inflammation, new severe left hydronephrosis concerning for UPJ stenosis.  7/21 CT abdo >>> stable left sided UPJ obstruction with severe hydro, cholelithiasis, stable hepatic cysts.  SIGNIFICANT EVENTS:  7/17 - initial  presentation to [facility], discharged home.  7/21 - presented again to [facility] ED for same symptoms (abd pain)  7/22 - transferred to [facility] for urological evaluation,- moved to ICU for respiratory distress- hypotensive in setting of sepsis, treated w/ crystalloid resuscitation & neo infusion. Percutaneous nephrostomy tube placed, cloudy and bloody drainage. Trop I elevated, cards consulted for tachycardia.  7/23: pain resolved. Still hypotensive but weaning pressors.    Antibiotics:  Aztreonam Vancomycin 7-22 <<< Levaquin 7-22 <<< Aztreonam 7/22<<<    (indicate start date, and stop date if known)  HPI/Subjective: alert  Objective: Filed Vitals:   07/27/13 0800  BP: 138/86  Pulse: 67  Temp: 97.7 F (36.5 C)  Resp: 18    Intake/Output Summary (Last 24 hours) at 07/27/13 1010 Last data filed at 07/27/13 0900  Gross per 24 hour  Intake 3769.83 ml  Output    820 ml  Net 2949.83 ml   Filed Weights   07/25/13 1132 07/26/13 0500  Weight: 46.409 kg (102 lb 5 oz) 46.3 kg (102 lb 1.2 oz)    Exam:   General:  alert  Cardiovascular: s1,s2 rrr  Respiratory: diminished AE LL  Abdomen: soft, nt,nd   Musculoskeletal: no LE edema   Data Reviewed: Basic Metabolic Panel:  Recent Labs Lab 07/21/13 1745 07/25/13 1225 07/26/13 0505 07/26/13 1830 07/27/13 0535  NA 137 135* 137 138 135*  K 3.6* 3.5* 3.1* 2.9* 3.5*  CL 99 96 100 102 101  CO2 26 27 26 22  18*  GLUCOSE 104* 106* 91 85 177*  BUN 14 38* 31* 30* 27*  CREATININE 0.82 1.70* 1.36* 1.33* 1.21*  CALCIUM 8.9 8.2* 7.8* 7.7* 7.8*  MG  --   --   --   --  2.4  PHOS  --   --   --   --  2.1*   Liver Function Tests:  Recent Labs Lab 07/21/13 1745 07/25/13 1225 07/26/13 0505 07/27/13 0535  AST 16 79* 40* 62*  ALT 16 62* 48* 49*  ALKPHOS 43 63 76 174*  BILITOT 0.6 0.4 0.3 0.7  PROT 6.2 5.9* 5.3* 4.9*  ALBUMIN 3.8 2.6* 2.2* 1.8*    Recent Labs Lab 07/25/13 1225  LIPASE 12   No results found for this basename:  AMMONIA,  in the last 168 hours CBC:  Recent Labs Lab 07/21/13 1745 07/25/13 1225 07/26/13 0505 07/26/13 1830 07/27/13 0535  WBC 10.5 13.9* 13.3* 3.1* 20.4*  NEUTROABS 9.1* 13.3*  --   --   --   HGB 13.1 12.3 10.9* 10.8* 11.2*  HCT 39.3 35.9* 32.6* 32.4* 33.1*  MCV 96.3 94.2 95.0 95.6 94.8  PLT 265 88* 80* PLATELET CLUMPS NOTED ON SMEAR, COUNT APPEARS DECREASED 42*   Cardiac Enzymes:  Recent Labs Lab 07/25/13 2333 07/26/13 0505 07/26/13 1752 07/26/13 1830 07/27/13 0114 07/27/13 0540  CKTOTAL  --   --   --  63  --   --   CKMB  --   --   --  3.0  --   --   TROPONINI <0.30 <0.30 0.75*  --  0.39* <0.30   BNP (last 3 results) No results found for this basename: PROBNP,  in the last 8760 hours CBG: No results found for this basename: GLUCAP,  in the last 168 hours  Recent Results (from the past 240 hour(s))  MRSA PCR SCREENING     Status: None   Collection Time    07/25/13  6:41 PM      Result Value Ref Range Status   MRSA by PCR NEGATIVE  NEGATIVE Final   Comment:            The GeneXpert MRSA Assay (FDA     approved for NASAL specimens     only), is one component of a     comprehensive MRSA colonization     surveillance program. It is not     intended to diagnose MRSA     infection nor to guide or     monitor treatment for     MRSA infections.  CULTURE, BLOOD (ROUTINE X 2)     Status: None   Collection Time    07/26/13  5:52 PM      Result Value Ref Range Status   Specimen Description BLOOD LEFT HAND   Final   Special Requests BOTTLES DRAWN AEROBIC AND ANAEROBIC 10 CC   Final   Culture  Setup Time     Final   Value: 07/26/2013 22:01     Performed at [facility]   Culture     Final   Value:        BLOOD CULTURE RECEIVED NO GROWTH TO DATE CULTURE WILL BE HELD FOR 5 DAYS BEFORE ISSUING A FINAL NEGATIVE REPORT     Performed at [facility]   Report Status PENDING   Incomplete  CULTURE, BLOOD (ROUTINE X 2)     Status: None   Collection Time     07/26/13  5:57 PM      Result Value Ref Range Status   Specimen Description BLOOD LEFT ARM   Final   Special Requests BOTTLES DRAWN AEROBIC AND ANAEROBIC 10 CC   Final   Culture  Setup Time  Final   Value: 07/26/2013 22:01     Performed at [facility]   Culture     Final   Value:        BLOOD CULTURE RECEIVED NO GROWTH TO DATE CULTURE WILL BE HELD FOR 5 DAYS BEFORE ISSUING A FINAL NEGATIVE REPORT     Performed at [facility]   Report Status PENDING   Incomplete     Studies: Ct Abdomen Pelvis Wo Contrast  07/25/2013   CLINICAL DATA:  Nausea, vomiting and diarrhea.  EXAM: CT ABDOMEN AND PELVIS WITHOUT CONTRAST  TECHNIQUE: Multidetector CT imaging of the abdomen and pelvis was performed following the standard protocol without IV contrast.  COMPARISON:  07/21/2013.  FINDINGS: The lung bases are stable.  Patchy bibasilar atelectasis.  The liver is grossly normal and stable. Small cysts are again demonstrated. Cholelithiasis is noted. The spleen is normal in size. No focal lesions. The pancreas is grossly normal.  Severe left-sided hydronephrosis due to a chronic UPJ obstruction. No obstructing ureteral calculus. There is persistent contrast in the left kidney from the recent CT scan. The right kidney is normal.  The bladder is unremarkable. Uterus and ovaries are unremarkable and stable. No mesenteric or retroperitoneal mass or adenopathy. Stable scattered lymph nodes. Stable advanced atherosclerotic calcifications involving the aorta and branch vessels.  No pelvic mass or adenopathy.  No inguinal mass or adenopathy.  The bony structures are stable.  IMPRESSION: Stable left-sided UPJ obstruction with severe hydronephrosis.  Chololithiasis.  Bibasilar atelectasis.  Stable hepatic cysts.   Electronically Signed   By: Kalman Jewels M.D.   On: 07/25/2013 14:20   Dg Chest 2 View  07/25/2013   CLINICAL DATA:  Severe abdominal pain  EXAM: CHEST  2 VIEW  COMPARISON:  None.  FINDINGS: The  lungs are hyperinflated likely secondary to COPD. There is no focal parenchymal opacity, pleural effusion, or pneumothorax. The heart and mediastinal contours are unremarkable. There is thoracic aortic atherosclerosis.  The osseous structures are unremarkable.  IMPRESSION: No active cardiopulmonary disease.   Electronically Signed   By: Kathreen Devoid   On: 07/25/2013 13:51   Dg Abd 1 View  07/26/2013   CLINICAL DATA:  Abdominal distention.  EXAM: ABDOMEN - 1 VIEW  COMPARISON:  None.  FINDINGS: Supine abdomen shows gas scattered along a nondilated colon. There is no gaseous small bowel dilatation. Probe overlies the lower midline pelvis. Bones are demineralized with degenerative changes noted in the lower lumbar spine.  IMPRESSION: No evidence for bowel obstruction although there is diffuse gaseous filling of the colon and a component of underlying mild colonic ileus would be a consideration.   Electronically Signed   By: Misty Stanley M.D.   On: 07/26/2013 19:06   Ir Perc Nephrostomy Left  07/27/2013   CLINICAL DATA:  Left hydronephrosis and sepsis.  EXAM: 1. ULTRASOUND GUIDANCE FOR PUNCTURE OF THE LEFT RENAL COLLECTING SYSTEM. 2. LEFT PERCUTANEOUS NEPHROSTOMY TUBE PLACEMENT.  COMPARISON:  CT of the abdomen on 07/25/2013.  ANESTHESIA/SEDATION: 0.5 mg IV Versed; 25 mcg IV Fentanyl.  Total Moderate Sedation Time  15 minutes  CONTRAST:  5 ml Omnipaque 300  MEDICATIONS: No additional medications.  FLUOROSCOPY TIME:  36 seconds.  PROCEDURE: The procedure, risks, benefits, and alternatives were explained to the patient's daughter. Questions regarding the procedure were encouraged and answered. The patient's daughter understands and consents to the procedure.  The left flank region was prepped with Betadine in a sterile fashion, and a sterile drape  was applied covering the operative field. A sterile gown and sterile gloves were used for the procedure. Local anesthesia was provided with 1% Lidocaine.  Ultrasound was  used to localize the left kidney. Under direct ultrasound guidance, an 18 gauge needle was advanced into the renal collecting system. Ultrasound image documentation was performed. Aspiration of urine sample was performed followed by contrast injection.  A guidewire was advanced into the collecting system through the needle. Percutaneous tract dilatation was then performed over the guidewire. A 10 French percutaneous nephrostomy tube was then advanced and formed in the collecting system. Catheter position was confirmed by fluoroscopy after contrast injection.  The catheter was secured at the skin with a Prolene retention suture and Stat-Lock device. A gravity bag was placed.  COMPLICATIONS: None.  FINDINGS: Ultrasound demonstrates severe left-sided hydronephrosis. Urine return was cloudy. A sample was sent for culture. The nephrostomy tube was formed at the level of the renal pelvis.  IMPRESSION: Placement of left percutaneous nephrostomy tube with 10 French tube advanced to the level of the renal pelvis. Urine return is cloudy and infected appearing. A sample of urine from the left kidney was sent for culture analysis.   Electronically Signed   By: Aletta Edouard M.D.   On: 07/27/2013 07:37   Ir US Guide Vasc Access Left  07/27/2013   CLINICAL DATA:  Left hydronephrosis and sepsis.  EXAM: 1. ULTRASOUND GUIDANCE FOR PUNCTURE OF THE LEFT RENAL COLLECTING SYSTEM. 2. LEFT PERCUTANEOUS NEPHROSTOMY TUBE PLACEMENT.  COMPARISON:  CT of the abdomen on 07/25/2013.  ANESTHESIA/SEDATION: 0.5 mg IV Versed; 25 mcg IV Fentanyl.  Total Moderate Sedation Time  15 minutes  CONTRAST:  5 ml Omnipaque 300  MEDICATIONS: No additional medications.  FLUOROSCOPY TIME:  36 seconds.  PROCEDURE: The procedure, risks, benefits, and alternatives were explained to the patient's daughter. Questions regarding the procedure were encouraged and answered. The patient's daughter understands and consents to the procedure.  The left flank region was  prepped with Betadine in a sterile fashion, and a sterile drape was applied covering the operative field. A sterile gown and sterile gloves were used for the procedure. Local anesthesia was provided with 1% Lidocaine.  Ultrasound was used to localize the left kidney. Under direct ultrasound guidance, an 18 gauge needle was advanced into the renal collecting system. Ultrasound image documentation was performed. Aspiration of urine sample was performed followed by contrast injection.  A guidewire was advanced into the collecting system through the needle. Percutaneous tract dilatation was then performed over the guidewire. A 10 French percutaneous nephrostomy tube was then advanced and formed in the collecting system. Catheter position was confirmed by fluoroscopy after contrast injection.  The catheter was secured at the skin with a Prolene retention suture and Stat-Lock device. A gravity bag was placed.  COMPLICATIONS: None.  FINDINGS: Ultrasound demonstrates severe left-sided hydronephrosis. Urine return was cloudy. A sample was sent for culture. The nephrostomy tube was formed at the level of the renal pelvis.  IMPRESSION: Placement of left percutaneous nephrostomy tube with 10 French tube advanced to the level of the renal pelvis. Urine return is cloudy and infected appearing. A sample of urine from the left kidney was sent for culture analysis.   Electronically Signed   By: Aletta Edouard M.D.   On: 07/27/2013 07:37   Dg Chest Port 1 View  07/27/2013   CLINICAL DATA:  Worsening shortness of breath.  EXAM: PORTABLE CHEST - 1 VIEW  COMPARISON:  Chest radiograph performed 07/26/2013  FINDINGS: The lungs are well-aerated. Vascular congestion is noted, with peribronchial thickening. Mildly increased interstitial markings are again seen, possibly reflecting minimal interstitial edema. No pleural effusion or pneumothorax is identified.  The cardiomediastinal silhouette is within normal limits. No acute osseous  abnormalities are seen.  IMPRESSION: Vascular congestion, with peribronchial thickening. Mildly increased interstitial markings may reflect minimal interstitial edema.   Electronically Signed   By: Garald Balding M.D.   On: 07/27/2013 00:54   Dg Chest Port 1 View  07/26/2013   CLINICAL DATA:  Shortness of breath with chest pain and tachycardia. Fever. History of COPD and seizures.  EXAM: PORTABLE CHEST - 1 VIEW  COMPARISON:  Radiographs 07/25/2013 and 07/08/2013.  FINDINGS: 1751 hr. The heart size and mediastinal contours are stable. Accentuated interstitial markings compared with the prior studies may be related to portable technique, although mild superimposed edema is difficult to exclude. There is no airspace disease, pleural effusion or pneumothorax. The osseous structures appear unchanged.  IMPRESSION: Mild accentuation of interstitial markings, likely technical. No definite acute findings.   Electronically Signed   By: Camie Patience M.D.   On: 07/26/2013 18:06    Scheduled Meds: . antiseptic oral rinse  15 mL Mouth Rinse BID  . arformoterol  15 mcg Nebulization BID  . aspirin EC  81 mg Oral Daily  . atorvastatin  80 mg Oral q1800  . aztreonam  1 g Intravenous 3 times per day  . budesonide  0.5 mg Nebulization BID  . citalopram  10 mg Oral Daily  . digoxin  0.25 mg Intravenous Daily  . levofloxacin (LEVAQUIN) IV  750 mg Intravenous Once   And  . [START ON 07/29/2013] levofloxacin (LEVAQUIN) IV  500 mg Intravenous Q48H  . LORazepam  0.5 mg Oral TID  . pantoprazole  40 mg Oral QAC breakfast  . potassium chloride  10 mEq Intravenous Q1 Hr x 3  . sodium chloride  3 mL Intravenous Q12H  . vancomycin  500 mg Intravenous Q24H   Continuous Infusions: . sodium chloride    . phenylephrine (NEO-SYNEPHRINE) Adult infusion 93.333 mcg/min (07/27/13 4580)    Principal Problem:   AKI (acute kidney injury) Active Problems:   Diarrhea   Abdominal pain, unspecified site   Hydronephrosis, left    Dehydration   Elevated troponin    Time spent: >35 minutes     Kinnie Feil  [facility] Pager [phone]. If 7PM-7AM, please contact night-coverage at [email or web address], password [facility] 07/27/2013, 10:10 AM  LOS: 2 days

## 2013-07-27 NOTE — Progress Notes (Signed)
ANTIBIOTIC CONSULT NOTE - INITIAL  Pharmacy Consult for Vancomycin and Aztreonam Indication: rule out sepsis / bacteremia  Allergies  Allergen Reactions  . Penicillins Anaphylaxis  . Dilaudid [Hydromorphone Hcl]     Severe sedation per son    Patient Measurements: Height: 5\' 1"  (154.9 cm) Weight: 102 lb 1.2 oz (46.3 kg) IBW/kg (Calculated) : 47.8  Vital Signs: Temp: 97.7 F (36.5 C) (07/23 0800) Temp src: Core (Comment) (07/23 0600) BP: 138/86 mmHg (07/23 0800) Pulse Rate: 67 (07/23 0800) Intake/Output from previous day: 07/22 0701 - 07/23 0700 In: 3969.8 [P.O.:480; I.V.:979.8; IV Piggyback:1500] Out: 760 [Urine:760]  Labs:  Recent Labs  07/26/13 0505 07/26/13 1830 07/27/13 0535  WBC 13.3* 3.1* 20.4*  HGB 10.9* 10.8* 11.2*  PLT 80* PLATELET CLUMPS NOTED ON SMEAR, COUNT APPEARS DECREASED 42*  CREATININE 1.36* 1.33* 1.21*   Estimated Creatinine Clearance: 26.2 ml/min (by C-G formula based on Cr of 1.21). No results found for this basename: VANCOTROUGH, Corlis Leak, VANCORANDOM, [place], GENTPEAK, GENTRANDOM, TOBRATROUGH, TOBRAPEAK, TOBRARND, AMIKACINPEAK, AMIKACINTROU, AMIKACIN,  in the last 72 hours   Microbiology: Recent Results (from the past 720 hour(s))  CLOSTRIDIUM DIFFICILE BY PCR     Status: None   Collection Time    07/09/13  2:07 PM      Result Value Ref Range Status   C difficile by pcr NEGATIVE  NEGATIVE Final  MRSA PCR SCREENING     Status: None   Collection Time    07/25/13  6:41 PM      Result Value Ref Range Status   MRSA by PCR NEGATIVE  NEGATIVE Final   Comment:            The GeneXpert MRSA Assay (FDA     approved for NASAL specimens     only), is one component of a     comprehensive MRSA colonization     surveillance program. It is not     intended to diagnose MRSA     infection nor to guide or     monitor treatment for     MRSA infections.  CULTURE, BLOOD (ROUTINE X 2)     Status: None   Collection Time    07/26/13  5:52 PM   Result Value Ref Range Status   Specimen Description BLOOD LEFT HAND   Final   Special Requests BOTTLES DRAWN AEROBIC AND ANAEROBIC 10 CC   Final   Culture  Setup Time     Final   Value: 07/26/2013 22:01     Performed at [facility]   Culture     Final   Value: Lonell Grandchild NEGATIVE RODS     GRAM POSITIVE COCCI IN PAIRS     Note: Gram Stain Report Called to,Read Back By and Verified With: STEPHANIE D@10 :34AM ON 07/27/13 BY DANTS     Performed at [facility]   Report Status PENDING   Incomplete  CULTURE, BLOOD (ROUTINE X 2)     Status: None   Collection Time    07/26/13  5:57 PM      Result Value Ref Range Status   Specimen Description BLOOD LEFT ARM   Final   Special Requests BOTTLES DRAWN AEROBIC AND ANAEROBIC 10 CC   Final   Culture  Setup Time     Final   Value: 07/26/2013 22:01     Performed at [facility]   Culture     Final   Value: [place]  COCCI IN PAIRS     Note: Gram Stain Report Called to,Read Back By and Verified With: STEPHANIE D@10 :34AM ON 07/27/13 BY DANTS     Performed at [facility]   Report Status PENDING   Incomplete    Medical History: Past Medical History  Diagnosis Date  . COPD (chronic obstructive pulmonary disease)   . Arthritis   . Stroke     TIA four years ago  . Depression   . Seizures     x 2 last year due to bleeding to the brain.  . Memory difficulties   . Chronic diarrhea   . Hiatal hernia   . Cancer     Skin cancer    Anti-infectives:  7/22 >> Vanc >> 7/22 >> Aztreonam >>     Assessment: 66 yoF presented to [facility] on 7/21 with severe abdominal pain, diarrhea, somnolence, poor PO intake and CT significant for severe hydronephrosis. History includes pancreatic stones scheduled to be removed at [facility] in ~2 wks, kidney stones, COPD, TIA, skin cancer, seizures. She was transferred to [facility] on 7/22 afternoon for urological evaluation; consider cystoscopy and ureteral stent  placement on Friday. Rapid response was called for severe and acute respiratory distress, abdominal distention, pain, tachycardia, fever. Pharmacy is consulted to dose Vancomycin and Aztreonam for r/o sepsis, likely related to pyelonephritis.  7/23:  Earlier this morning, vancomycin discontinued by CCM however orders to resume this afternoon after blood cultures now growing GPC in pairs in addition to GNRs.  No doses missed yet.  Pt remains also on Aztreonam and Levaquin (renally adjusted ) for GNR bacteremia and urosepsis.    7/22 >> Vanc >> 7/23 7/23 >> Vanc >> 7/22 >> Aztreonam >>  7/23 >> LVQ >>   Tmax: 104.4 (Tc 97.9) WBCs: 13.3 Renal: SCr improved to 1.21, CrCl ~ 26 ml/min / N 41 (Baseline SCr ~ 0.7, but AKI w/ severe hydronephrosis on admission and SCr 1.7)  7/22 blood: 2/2 GNRs, GPC in pairs 7/22 urine: collected 7/22 MRSA: Negative  Goal of Therapy:  Vancomycin trough level 15-20 mcg/ml Appropriate abx dosing, eradication of infection.   Plan:   Continue Aztreonam 1g IV q8h and levaquin 750mg  IV x1, then 500mg  q48h  Restart Vancomycin 500mg  IV q24h.  Measure Vanc trough at steady state.  Follow up renal fxn and culture results.  Ralene Bathe, PharmD, BCPS 07/27/2013, 2:16 PM  Pager: [phone]

## 2013-07-27 NOTE — Progress Notes (Signed)
Patient has not had any stools since prior to admission on 07/25/13.  Per daughter, patient had a colonoscopy and was diagnosed with microscopic colitis at the beginning of July 2015.  Spoke with Arta Bruce in infection prevention.  Order for contact precautions discontinued per protocol of no stools in 48 hrs.

## 2013-07-27 NOTE — Progress Notes (Signed)
CRITICAL VALUE ALERT  Critical value received:  Potassium 2.9  Date of notification:  07/26/2013  Time of notification:  2145  Critical value read back:Yes.    Nurse who received alert:  Gladys Damme, RN  MD notified (1st page):  [place]  Time of first page:  2145  MD notified (2nd page):  Time of second page:  Responding MD:  Dyann Kief  Time MD responded:  2150

## 2013-07-27 NOTE — Progress Notes (Signed)
Pt bed elevated to high fowlers position to assess swallow function for PO potassium administration. Pt was alert but unable to swallow without coughing. Will keep pt NPO with no meds by mouth and notify MD. Pt is stable. Will continue to monitor.

## 2013-07-27 NOTE — Consult Note (Signed)
Ref: Delphina Cahill, MD   Subjective:  Awake. Feeling better. Bloody drainage from nephrostomy tube. On vancomycin and  Aztreonam. Also on neo-synephrine. Echocardiogram with good LV systolic function.  Objective:  Vital Signs in the last 24 hours: Temp:  [97.7 F (36.5 C)-104.4 F (40.2 C)] 97.9 F (36.6 C) (07/23 0600) Pulse Rate:  [30-163] 63 (07/23 0600) Cardiac Rhythm:  [-] Sinus tachycardia (07/22 2333) Resp:  [13-32] 15 (07/23 0600) BP: (53-158)/(35-86) 87/53 mmHg (07/23 0600) SpO2:  [85 %-100 %] 99 % (07/23 0745)  Physical Exam: BP Readings from Last 1 Encounters:  07/27/13 87/53    Wt Readings from Last 1 Encounters:  07/26/13 46.3 kg (102 lb 1.2 oz)    Weight change:   HEENT: [place]/AT, Eyes-Hazel, PERL, EOMI, Conjunctiva-Pink, Sclera-Non-icteric. Tongue-dry Neck: + JVD, No bruit, Trachea midline. Lungs:  Clear, Bilateral. Cardiac:  Regular rhythm, normal S1 and S2, no S3.  Abdomen:  Soft, non-tender. Left sided nephrostomy tube. Extremities:  No edema present. No cyanosis. No clubbing. CNS: AxOx2, Cranial nerves grossly intact, moves all 4 extremities.  Skin: Warm and dry.   Intake/Output from previous day: 07/22 0701 - 07/23 0700 In: 3969.8 [P.O.:480; I.V.:979.8; IV Piggyback:1500] Out: 760 [Urine:760]    Lab Results: BMET    Component Value Date/Time   NA 135* 07/27/2013 0535   NA 138 07/26/2013 1830   NA 137 07/26/2013 0505   K 3.5* 07/27/2013 0535   K 2.9* 07/26/2013 1830   K 3.1* 07/26/2013 0505   CL 101 07/27/2013 0535   CL 102 07/26/2013 1830   CL 100 07/26/2013 0505   CO2 18* 07/27/2013 0535   CO2 22 07/26/2013 1830   CO2 26 07/26/2013 0505   GLUCOSE 177* 07/27/2013 0535   GLUCOSE 85 07/26/2013 1830   GLUCOSE 91 07/26/2013 0505   BUN 27* 07/27/2013 0535   BUN 30* 07/26/2013 1830   BUN 31* 07/26/2013 0505   CREATININE 1.21* 07/27/2013 0535   CREATININE 1.33* 07/26/2013 1830   CREATININE 1.36* 07/26/2013 0505   CALCIUM 7.8* 07/27/2013 0535   CALCIUM 7.7*  07/26/2013 1830   CALCIUM 7.8* 07/26/2013 0505   GFRNONAA 41* 07/27/2013 0535   GFRNONAA 36* 07/26/2013 1830   GFRNONAA 35* 07/26/2013 0505   GFRAA 47* 07/27/2013 0535   GFRAA 42* 07/26/2013 1830   GFRAA 41* 07/26/2013 0505   CBC    Component Value Date/Time   WBC 20.4* 07/27/2013 0535   RBC 3.49* 07/27/2013 0535   HGB 11.2* 07/27/2013 0535   HCT 33.1* 07/27/2013 0535   PLT 42* 07/27/2013 0535   MCV 94.8 07/27/2013 0535   MCH 32.1 07/27/2013 0535   MCHC 33.8 07/27/2013 0535   RDW 15.6* 07/27/2013 0535   LYMPHSABS 0.2* 07/25/2013 1225   MONOABS 0.4 07/25/2013 1225   EOSABS 0.0 07/25/2013 1225   BASOSABS 0.0 07/25/2013 1225   HEPATIC Function Panel  Recent Labs  07/25/13 1225 07/26/13 0505 07/27/13 0535  PROT 5.9* 5.3* 4.9*   HEMOGLOBIN A1C No components found with this basename: HGA1C,  MPG   CARDIAC ENZYMES Lab Results  Component Value Date   CKTOTAL 63 07/26/2013   CKMB 3.0 07/26/2013   TROPONINI <0.30 07/27/2013   TROPONINI 0.39* 07/27/2013   TROPONINI 0.75* 07/26/2013   BNP No results found for this basename: PROBNP,  in the last 8760 hours TSH  Recent Labs  11/14/12 0605 04/20/13 1622  TSH 0.686 0.635   CHOLESTEROL  Recent Labs  11/21/12 1100  CHOL 206*  Scheduled Meds: . antiseptic oral rinse  15 mL Mouth Rinse BID  . arformoterol  15 mcg Nebulization BID  . aspirin EC  81 mg Oral Daily  . atorvastatin  80 mg Oral q1800  . aztreonam  1 g Intravenous 3 times per day  . budesonide  0.5 mg Nebulization BID  . citalopram  10 mg Oral Daily  . digoxin  0.25 mg Intravenous Daily  . levofloxacin (LEVAQUIN) IV  750 mg Intravenous Once   And  . [START ON 07/29/2013] levofloxacin (LEVAQUIN) IV  500 mg Intravenous Q48H  . lidocaine      . LORazepam  0.5 mg Oral TID  . metoprolol  2.5 mg Intravenous Once  . pantoprazole  40 mg Oral QAC breakfast  . potassium chloride  40 mEq Oral BID  . sodium chloride  3 mL Intravenous Q12H  . vancomycin  500 mg Intravenous Q24H    Continuous Infusions: . sodium chloride    . diltiazem (CARDIZEM) infusion    . phenylephrine (NEO-SYNEPHRINE) Adult infusion 120 mcg/min (07/27/13 0656)   PRN Meds:.acetaminophen, acetaminophen, levalbuterol, ondansetron (ZOFRAN) IV, ondansetron  Assessment/Plan: Sinus tachycardia- Improved Fever -resolving Acute pyelonephritis with sepsis, (gm negative most likely) COPD  Arthritis  H/O stroke  H/O skin cancer  Dehydration  H/O seizures  H/O depression  Hypokalemia-Improving  Increase IV fluids to 100 cc/hr.    LOS: 2 days    Dixie Dials  MD  07/27/2013, 8:06 AM

## 2013-07-28 ENCOUNTER — Ambulatory Visit (HOSPITAL_COMMUNITY): Payer: Medicare Other

## 2013-07-28 ENCOUNTER — Inpatient Hospital Stay (HOSPITAL_COMMUNITY): Payer: Medicare Other

## 2013-07-28 DIAGNOSIS — D696 Thrombocytopenia, unspecified: Secondary | ICD-10-CM

## 2013-07-28 DIAGNOSIS — J9600 Acute respiratory failure, unspecified whether with hypoxia or hypercapnia: Secondary | ICD-10-CM

## 2013-07-28 LAB — DIC (DISSEMINATED INTRAVASCULAR COAGULATION)PANEL
Fibrinogen: 628 mg/dL — ABNORMAL HIGH (ref 204–475)
Platelets: 17 10*3/uL — CL (ref 150–400)
Smear Review: NONE SEEN
aPTT: 26 seconds (ref 24–37)

## 2013-07-28 LAB — BASIC METABOLIC PANEL
Anion gap: 11 (ref 5–15)
BUN: 26 mg/dL — AB (ref 6–23)
CALCIUM: 7.6 mg/dL — AB (ref 8.4–10.5)
CO2: 19 meq/L (ref 19–32)
Chloride: 106 mEq/L (ref 96–112)
Creatinine, Ser: 0.93 mg/dL (ref 0.50–1.10)
GFR calc Af Amer: 65 mL/min — ABNORMAL LOW (ref >90)
GFR calc non Af Amer: 56 mL/min — ABNORMAL LOW (ref >90)
GLUCOSE: 96 mg/dL (ref 70–99)
Potassium: 3.4 mEq/L — ABNORMAL LOW (ref 3.7–5.3)
Sodium: 136 mEq/L — ABNORMAL LOW (ref 137–147)

## 2013-07-28 LAB — BLOOD GAS, ARTERIAL
ACID-BASE DEFICIT: 3 mmol/L — AB (ref 0.0–2.0)
BICARBONATE: 21 meq/L (ref 20.0–24.0)
Drawn by: 252031
O2 Content: 3 L/min
O2 SAT: 95.9 %
PATIENT TEMPERATURE: 98.6
TCO2: 18.8 mmol/L (ref 0–100)
pCO2 arterial: 36.4 mmHg (ref 35.0–45.0)
pH, Arterial: 7.38 (ref 7.350–7.450)
pO2, Arterial: 78.7 mmHg — ABNORMAL LOW (ref 80.0–100.0)

## 2013-07-28 LAB — CBC
HEMATOCRIT: 33.6 % — AB (ref 36.0–46.0)
Hemoglobin: 11.3 g/dL — ABNORMAL LOW (ref 12.0–15.0)
MCH: 31.9 pg (ref 26.0–34.0)
MCHC: 33.6 g/dL (ref 30.0–36.0)
MCV: 94.9 fL (ref 78.0–100.0)
PLATELETS: 21 10*3/uL — AB (ref 150–400)
RBC: 3.54 MIL/uL — AB (ref 3.87–5.11)
RDW: 15.6 % — ABNORMAL HIGH (ref 11.5–15.5)
WBC: 14.7 10*3/uL — ABNORMAL HIGH (ref 4.0–10.5)

## 2013-07-28 LAB — DIC (DISSEMINATED INTRAVASCULAR COAGULATION) PANEL
D DIMER QUANT: 10.6 ug{FEU}/mL — AB (ref 0.00–0.48)
INR: 1 (ref 0.00–1.49)
PROTHROMBIN TIME: 13.2 s (ref 11.6–15.2)

## 2013-07-28 LAB — ABO/RH: ABO/RH(D): A POS

## 2013-07-28 MED ORDER — POTASSIUM CHLORIDE 10 MEQ/100ML IV SOLN
10.0000 meq | INTRAVENOUS | Status: AC
  Administered 2013-07-28 (×2): 10 meq via INTRAVENOUS
  Filled 2013-07-28 (×2): qty 100

## 2013-07-28 MED ORDER — LORAZEPAM 2 MG/ML IJ SOLN
0.5000 mg | Freq: Three times a day (TID) | INTRAMUSCULAR | Status: DC
Start: 2013-07-28 — End: 2013-07-29
  Administered 2013-07-28 (×2): 0.5 mg via INTRAVENOUS
  Filled 2013-07-28 (×2): qty 1

## 2013-07-28 MED ORDER — ALBUTEROL SULFATE (2.5 MG/3ML) 0.083% IN NEBU
2.5000 mg | INHALATION_SOLUTION | RESPIRATORY_TRACT | Status: DC | PRN
  Administered 2013-07-28: 2.5 mg via RESPIRATORY_TRACT
  Filled 2013-07-28: qty 3

## 2013-07-28 MED ORDER — TIOTROPIUM BROMIDE MONOHYDRATE 18 MCG IN CAPS
18.0000 ug | ORAL_CAPSULE | Freq: Every day | RESPIRATORY_TRACT | Status: DC
  Administered 2013-07-29 – 2013-08-02 (×5): 18 ug via RESPIRATORY_TRACT
  Filled 2013-07-28: qty 5

## 2013-07-28 MED ORDER — HALOPERIDOL LACTATE 5 MG/ML IJ SOLN
5.0000 mg | Freq: Once | INTRAMUSCULAR | Status: AC
  Administered 2013-07-28: 5 mg via INTRAVENOUS
  Filled 2013-07-28: qty 1

## 2013-07-28 MED ORDER — IOHEXOL 350 MG/ML SOLN
100.0000 mL | Freq: Once | INTRAVENOUS | Status: AC | PRN
  Administered 2013-07-28: 100 mL via INTRAVENOUS

## 2013-07-28 MED ORDER — CARVEDILOL 3.125 MG PO TABS
3.1250 mg | ORAL_TABLET | Freq: Two times a day (BID) | ORAL | Status: DC
  Administered 2013-07-28 – 2013-07-29 (×2): 3.125 mg via ORAL
  Filled 2013-07-28 (×9): qty 1

## 2013-07-28 MED ORDER — FUROSEMIDE 10 MG/ML IJ SOLN
20.0000 mg | Freq: Once | INTRAMUSCULAR | Status: AC
  Administered 2013-07-28: 20 mg via INTRAVENOUS
  Filled 2013-07-28: qty 2

## 2013-07-28 NOTE — Progress Notes (Signed)
Subjective:  Pt resting quietly, little confused but follows commands; denies sig flank pain  Objective: Vital signs in last 24 hours: Temp:  [98.1 F (36.7 C)-99.7 F (37.6 C)] 99.1 F (37.3 C) (07/24 1000) Pulse Rate:  [71-104] 104 (07/24 1000) Resp:  [18-40] 23 (07/24 1000) BP: (80-124)/(47-83) 118/77 mmHg (07/24 1000) SpO2:  [93 %-97 %] 97 % (07/24 1000) Last BM Date: 07/25/13  Intake/Output from previous day: 07/23 0701 - 07/24 0700 In: 2009.3 [I.V.:1079.3; IV Piggyback:650] Out: 475 [Urine:475] Intake/Output this shift: Total I/O In: 1061.3 [I.V.:961.3; IV Piggyback:100] Out: 445 [Urine:445]  Left PCN intact, no sig tenderness, output 50 cc's blood-tinged urine, cx's proteus, sens pend, creat 0.93  Lab Results:   Recent Labs  07/27/13 0535 07/28/13 0345 07/28/13 0817  WBC 20.4* 14.7*  --   HGB 11.2* 11.3*  --   HCT 33.1* 33.6*  --   PLT 42* 21* 17*   BMET  Recent Labs  07/27/13 0535 07/28/13 0345  NA 135* 136*  K 3.5* 3.4*  CL 101 106  CO2 18* 19  GLUCOSE 177* 96  BUN 27* 26*  CREATININE 1.21* 0.93  CALCIUM 7.8* 7.6*   PT/INR  Recent Labs  07/28/13 0817  LABPROT 13.2  INR 1.00   ABG  Recent Labs  07/26/13 1731  PHART 7.275*  HCO3 22.5    Studies/Results: Dg Chest 1 View  07/28/2013   CLINICAL DATA:  Weakness.  Edema.  EXAM: CHEST - 1 VIEW  COMPARISON:  07/27/2013  FINDINGS: There is irregular interstitial thickening, which appears minimally increased from the previous day's study. Basilar coarse reticular and discoid opacity, likely atelectasis, has increased and there are small effusions which have increased from the prior exam. No focal lung consolidation. No other change.  IMPRESSION: Pulmonary edema with mild worsening since the previous day's study.   Electronically Signed   By: Lajean Manes M.D.   On: 07/28/2013 09:03   Dg Abd 1 View  07/26/2013   CLINICAL DATA:  Abdominal distention.  EXAM: ABDOMEN - 1 VIEW  COMPARISON:   None.  FINDINGS: Supine abdomen shows gas scattered along a nondilated colon. There is no gaseous small bowel dilatation. Probe overlies the lower midline pelvis. Bones are demineralized with degenerative changes noted in the lower lumbar spine.  IMPRESSION: No evidence for bowel obstruction although there is diffuse gaseous filling of the colon and a component of underlying mild colonic ileus would be a consideration.   Electronically Signed   By: Misty Stanley M.D.   On: 07/26/2013 19:06   Ir Perc Nephrostomy Left  07/27/2013   CLINICAL DATA:  Left hydronephrosis and sepsis.  EXAM: 1. ULTRASOUND GUIDANCE FOR PUNCTURE OF THE LEFT RENAL COLLECTING SYSTEM. 2. LEFT PERCUTANEOUS NEPHROSTOMY TUBE PLACEMENT.  COMPARISON:  CT of the abdomen on 07/25/2013.  ANESTHESIA/SEDATION: 0.5 mg IV Versed; 25 mcg IV Fentanyl.  Total Moderate Sedation Time  15 minutes  CONTRAST:  5 ml Omnipaque 300  MEDICATIONS: No additional medications.  FLUOROSCOPY TIME:  36 seconds.  PROCEDURE: The procedure, risks, benefits, and alternatives were explained to the patient's daughter. Questions regarding the procedure were encouraged and answered. The patient's daughter understands and consents to the procedure.  The left flank region was prepped with Betadine in a sterile fashion, and a sterile drape was applied covering the operative field. A sterile gown and sterile gloves were used for the procedure. Local anesthesia was provided with 1% Lidocaine.  Ultrasound was used to localize the left kidney.  Under direct ultrasound guidance, an 18 gauge needle was advanced into the renal collecting system. Ultrasound image documentation was performed. Aspiration of urine sample was performed followed by contrast injection.  A guidewire was advanced into the collecting system through the needle. Percutaneous tract dilatation was then performed over the guidewire. A 10 French percutaneous nephrostomy tube was then advanced and formed in the collecting  system. Catheter position was confirmed by fluoroscopy after contrast injection.  The catheter was secured at the skin with a Prolene retention suture and Stat-Lock device. A gravity bag was placed.  COMPLICATIONS: None.  FINDINGS: Ultrasound demonstrates severe left-sided hydronephrosis. Urine return was cloudy. A sample was sent for culture. The nephrostomy tube was formed at the level of the renal pelvis.  IMPRESSION: Placement of left percutaneous nephrostomy tube with 10 French tube advanced to the level of the renal pelvis. Urine return is cloudy and infected appearing. A sample of urine from the left kidney was sent for culture analysis.   Electronically Signed   By: Aletta Edouard M.D.   On: 07/27/2013 07:37   Ir US Guide Vasc Access Left  07/27/2013   CLINICAL DATA:  Left hydronephrosis and sepsis.  EXAM: 1. ULTRASOUND GUIDANCE FOR PUNCTURE OF THE LEFT RENAL COLLECTING SYSTEM. 2. LEFT PERCUTANEOUS NEPHROSTOMY TUBE PLACEMENT.  COMPARISON:  CT of the abdomen on 07/25/2013.  ANESTHESIA/SEDATION: 0.5 mg IV Versed; 25 mcg IV Fentanyl.  Total Moderate Sedation Time  15 minutes  CONTRAST:  5 ml Omnipaque 300  MEDICATIONS: No additional medications.  FLUOROSCOPY TIME:  36 seconds.  PROCEDURE: The procedure, risks, benefits, and alternatives were explained to the patient's daughter. Questions regarding the procedure were encouraged and answered. The patient's daughter understands and consents to the procedure.  The left flank region was prepped with Betadine in a sterile fashion, and a sterile drape was applied covering the operative field. A sterile gown and sterile gloves were used for the procedure. Local anesthesia was provided with 1% Lidocaine.  Ultrasound was used to localize the left kidney. Under direct ultrasound guidance, an 18 gauge needle was advanced into the renal collecting system. Ultrasound image documentation was performed. Aspiration of urine sample was performed followed by contrast  injection.  A guidewire was advanced into the collecting system through the needle. Percutaneous tract dilatation was then performed over the guidewire. A 10 French percutaneous nephrostomy tube was then advanced and formed in the collecting system. Catheter position was confirmed by fluoroscopy after contrast injection.  The catheter was secured at the skin with a Prolene retention suture and Stat-Lock device. A gravity bag was placed.  COMPLICATIONS: None.  FINDINGS: Ultrasound demonstrates severe left-sided hydronephrosis. Urine return was cloudy. A sample was sent for culture. The nephrostomy tube was formed at the level of the renal pelvis.  IMPRESSION: Placement of left percutaneous nephrostomy tube with 10 French tube advanced to the level of the renal pelvis. Urine return is cloudy and infected appearing. A sample of urine from the left kidney was sent for culture analysis.   Electronically Signed   By: Aletta Edouard M.D.   On: 07/27/2013 07:37   Dg Chest Port 1 View  07/27/2013   CLINICAL DATA:  Worsening shortness of breath.  EXAM: PORTABLE CHEST - 1 VIEW  COMPARISON:  Chest radiograph performed 07/26/2013  FINDINGS: The lungs are well-aerated. Vascular congestion is noted, with peribronchial thickening. Mildly increased interstitial markings are again seen, possibly reflecting minimal interstitial edema. No pleural effusion or pneumothorax is identified.  The  cardiomediastinal silhouette is within normal limits. No acute osseous abnormalities are seen.  IMPRESSION: Vascular congestion, with peribronchial thickening. Mildly increased interstitial markings may reflect minimal interstitial edema.   Electronically Signed   By: Garald Balding M.D.   On: 07/27/2013 00:54   Dg Chest Port 1 View  07/26/2013   CLINICAL DATA:  Shortness of breath with chest pain and tachycardia. Fever. History of COPD and seizures.  EXAM: PORTABLE CHEST - 1 VIEW  COMPARISON:  Radiographs 07/25/2013 and 07/08/2013.   FINDINGS: 1751 hr. The heart size and mediastinal contours are stable. Accentuated interstitial markings compared with the prior studies may be related to portable technique, although mild superimposed edema is difficult to exclude. There is no airspace disease, pleural effusion or pneumothorax. The osseous structures appear unchanged.  IMPRESSION: Mild accentuation of interstitial markings, likely technical. No definite acute findings.   Electronically Signed   By: Camie Patience M.D.   On: 07/26/2013 18:06    Anti-infectives: Anti-infectives   Start     Dose/Rate Route Frequency Ordered Stop   07/29/13 0800  levofloxacin (LEVAQUIN) IVPB 500 mg     500 mg 100 mL/hr over 60 Minutes Intravenous Every 48 hours 07/27/13 0748     07/27/13 1800  vancomycin (VANCOCIN) 500 mg in sodium chloride 0.9 % 100 mL IVPB     500 mg 100 mL/hr over 60 Minutes Intravenous Every 24 hours 07/27/13 1417     07/27/13 0900  levofloxacin (LEVAQUIN) IVPB 750 mg     750 mg 100 mL/hr over 90 Minutes Intravenous  Once 07/27/13 0748 07/27/13 1055   07/27/13 0600  aztreonam (AZACTAM) 1 g in dextrose 5 % 50 mL IVPB     1 g 100 mL/hr over 30 Minutes Intravenous 3 times per day 07/26/13 1938     07/27/13 0200  metroNIDAZOLE (FLAGYL) IVPB 500 mg  Status:  Discontinued     500 mg 100 mL/hr over 60 Minutes Intravenous Every 8 hours 07/26/13 1822 07/26/13 1922   07/26/13 1945  aztreonam (AZACTAM) 2 g in dextrose 5 % 50 mL IVPB     2 g 100 mL/hr over 30 Minutes Intravenous NOW 07/26/13 1932 07/26/13 2148   07/26/13 1830  vancomycin (VANCOCIN) 500 mg in sodium chloride 0.9 % 100 mL IVPB  Status:  Discontinued     500 mg 100 mL/hr over 60 Minutes Intravenous Every 24 hours 07/26/13 1817 07/27/13 1038   07/26/13 1830  metroNIDAZOLE (FLAGYL) IVPB 500 mg  Status:  Discontinued     500 mg 100 mL/hr over 60 Minutes Intravenous  Once 07/26/13 1822 07/26/13 1922   07/26/13 1830  levofloxacin (LEVAQUIN) IVPB 750 mg  Status:   Discontinued     750 mg 100 mL/hr over 90 Minutes Intravenous Every 48 hours 07/26/13 1823 07/26/13 1930      Assessment/Plan: s/p left PCN 7/22; check proteus sensitivities, monitor labs; will have nursing begin gentle PCN flushes with sterile NS; other plans as per CCM/primary  LOS: 3 days    Aldrick Derrig,D [facility] 07/28/2013

## 2013-07-28 NOTE — Progress Notes (Signed)
[facility] PROGRESS NOTE  Bridget Ball:[id number] DOB: February 18, 1928 DOA: 07/25/2013 PCP: Delphina Cahill, MD  Assessment/Plan: 80 y/o female with PMH of advanced COPD, smoker, anxiety presented to [facility] On 7/21 w/ cc: abdominal pain, diarrhea, poor oral intake and diagnosed with left hydro.  -Transferred to [facility] from [facility] for consideration of perc drainage. On 7/22, She was transferred to ICU increased resp distress, hypotension  1. Sepsis/fever/UTI/septic shock off pressor; hemodynamically improved  -cont IV atx, blood cultures (GNR, GPC) pend sen; cont gentle IVF prn  2. Acute respiratory failure CXR: no clear infiltrates, but some edema  -repeat CXR, mild congestion; prn diuresis; cont oxygen, inhalers as needed; stop smoking  3. COPD. Cont as above 4. Left UPJ obstruction with hydronephrosis -7/22 : s/p left percutaneous nephrostomy; cont per urology  5. Mild troponin elevation, suspect element of demand ischemia; no chest pain -echo: Systolic function was normal. The estimated ejection fraction was in the range of 81% to 19%; diastolic dysfunction  6. Thrombocytopenia likely sepsis related vs consumptive procedure IRL   -no s/s of bleeding; recheck CBC in AM: hold heparin; HIT antbd, DIC panel; TF prn;  7. Tachycardia likely sepsis related; d/c dig; Cardizem as needed if BP allows    DVT prophylaxis; start SCD; hold heparin due to thrombocytopenia   Code Status: DNR Family Communication: patient, nurse; recently called updated Mims,[name] Daughter [phone]  (indicate person spoken with, relationship, and if by phone, the number) Disposition Plan: pend clinical improvement    Consultants:  Urology, critical care   Procedures: 7/22 : s/p left percutaneous nephrostomy  7/17 CT abdo >>> stable hepatic cysts, cholelithiasis without inflammation, new severe left hydronephrosis concerning for UPJ stenosis.  7/21 CT abdo >>> stable left sided UPJ obstruction with  severe hydro, cholelithiasis, stable hepatic cysts.  SIGNIFICANT EVENTS:  7/17 - initial presentation to [facility], discharged home.  7/21 - presented again to [facility] for same symptoms (abd pain)  7/22 - transferred to [facility] for urological evaluation,- moved to ICU for respiratory distress- hypotensive in setting of sepsis, treated w/ crystalloid resuscitation & neo infusion. Percutaneous nephrostomy tube placed, cloudy and bloody drainage. Trop I elevated, cards consulted for tachycardia.  7/23: pain resolved. Still hypotensive but weaning pressors.    Antibiotics:  Aztreonam Vancomycin 7-22 <<< Levaquin 7-22 <<< Aztreonam 7/22<<<    (indicate start date, and stop date if known)  HPI/Subjective: alert  Objective: Filed Vitals:   07/28/13 0700  BP: 101/76  Pulse:   Temp: 98.6 F (37 C)  Resp: 20    Intake/Output Summary (Last 24 hours) at 07/28/13 0823 Last data filed at 07/28/13 0600  Gross per 24 hour  Intake 1601.69 ml  Output    445 ml  Net 1156.69 ml   Filed Weights   07/25/13 1132 07/26/13 0500  Weight: 46.409 kg (102 lb 5 oz) 46.3 kg (102 lb 1.2 oz)    Exam:   General:  alert  Cardiovascular: s1,s2 rrr  Respiratory: diminished AE LL  Abdomen: soft, nt,nd   Musculoskeletal: no LE edema   Data Reviewed: Basic Metabolic Panel:  Recent Labs Lab 07/25/13 1225 07/26/13 0505 07/26/13 1830 07/27/13 0535 07/28/13 0345  NA 135* 137 138 135* 136*  K 3.5* 3.1* 2.9* 3.5* 3.4*  CL 96 100 102 101 106  CO2 27 26 22  18* 19  GLUCOSE 106* 91 85 177* 96  BUN 38* 31* 30* 27* 26*  CREATININE 1.70* 1.36* 1.33* 1.21* 0.93  CALCIUM  8.2* 7.8* 7.7* 7.8* 7.6*  MG  --   --   --  2.4  --   PHOS  --   --   --  2.1*  --    Liver Function Tests:  Recent Labs Lab 07/21/13 1745 07/25/13 1225 07/26/13 0505 07/27/13 0535  AST 16 79* 40* 62*  ALT 16 62* 48* 49*  ALKPHOS 43 63 76 174*  BILITOT 0.6 0.4 0.3 0.7  PROT 6.2 5.9* 5.3* 4.9*  ALBUMIN 3.8 2.6* 2.2* 1.8*     Recent Labs Lab 07/25/13 1225  LIPASE 12   No results found for this basename: AMMONIA,  in the last 168 hours CBC:  Recent Labs Lab 07/21/13 1745 07/25/13 1225 07/26/13 0505 07/26/13 1830 07/27/13 0535 07/28/13 0345  WBC 10.5 13.9* 13.3* 3.1* 20.4* 14.7*  NEUTROABS 9.1* 13.3*  --   --   --   --   HGB 13.1 12.3 10.9* 10.8* 11.2* 11.3*  HCT 39.3 35.9* 32.6* 32.4* 33.1* 33.6*  MCV 96.3 94.2 95.0 95.6 94.8 94.9  PLT 265 88* 80* PLATELET CLUMPS NOTED ON SMEAR, COUNT APPEARS DECREASED 42* 21*   Cardiac Enzymes:  Recent Labs Lab 07/25/13 2333 07/26/13 0505 07/26/13 1752 07/26/13 1830 07/27/13 0114 07/27/13 0540  CKTOTAL  --   --   --  63  --   --   CKMB  --   --   --  3.0  --   --   TROPONINI <0.30 <0.30 0.75*  --  0.39* <0.30   BNP (last 3 results) No results found for this basename: PROBNP,  in the last 8760 hours CBG: No results found for this basename: GLUCAP,  in the last 168 hours  Recent Results (from the past 240 hour(s))  MRSA PCR SCREENING     Status: None   Collection Time    07/25/13  6:41 PM      Result Value Ref Range Status   MRSA by PCR NEGATIVE  NEGATIVE Final   Comment:            The GeneXpert MRSA Assay (FDA     approved for NASAL specimens     only), is one component of a     comprehensive MRSA colonization     surveillance program. It is not     intended to diagnose MRSA     infection nor to guide or     monitor treatment for     MRSA infections.  URINE CULTURE     Status: None   Collection Time    07/26/13  4:00 PM      Result Value Ref Range Status   Specimen Description URINE, CLEAN CATCH   Final   Special Requests NONE   Final   Culture  Setup Time     Final   Value: 07/26/2013 19:19     Performed at [place]     Final   Value: >=100,000 COLONIES/ML     Performed at [facility]   Culture     Final   Value: PROTEUS MIRABILIS     Performed at [facility]   Report Status  PENDING   Incomplete  URINE CULTURE     Status: None   Collection Time    07/26/13  4:00 PM      Result Value Ref Range Status   Specimen Description URINE, CLEAN CATCH   Final   Special Requests NONE   Final  Culture  Setup Time     Final   Value: 07/27/2013 04:47     Performed at [place]     Final   Value: >=100,000 COLONIES/ML     Performed at [facility]   Culture     Final   Value: PROTEUS MIRABILIS     Performed at [facility]   Report Status PENDING   Incomplete  CULTURE, BLOOD (ROUTINE X 2)     Status: None   Collection Time    07/26/13  5:52 PM      Result Value Ref Range Status   Specimen Description BLOOD LEFT HAND   Final   Special Requests BOTTLES DRAWN AEROBIC AND ANAEROBIC 10 CC   Final   Culture  Setup Time     Final   Value: 07/26/2013 22:01     Performed at [facility]   Culture     Final   Value: Lonell Grandchild NEGATIVE RODS     GRAM POSITIVE COCCI IN PAIRS     Note: Gram Stain Report Called to,Read Back By and Verified With: STEPHANIE D@10 :34AM ON 07/27/13 BY DANTS     Performed at [facility]   Report Status PENDING   Incomplete  CULTURE, BLOOD (ROUTINE X 2)     Status: None   Collection Time    07/26/13  5:57 PM      Result Value Ref Range Status   Specimen Description BLOOD LEFT ARM   Final   Special Requests BOTTLES DRAWN AEROBIC AND ANAEROBIC 10 CC   Final   Culture  Setup Time     Final   Value: 07/26/2013 22:01     Performed at [facility]   Culture     Final   Value: Lonell Grandchild NEGATIVE RODS     GRAM POSITIVE COCCI IN PAIRS     Note: Gram Stain Report Called to,Read Back By and Verified With: STEPHANIE D@10 :34AM ON 07/27/13 BY DANTS     Performed at [facility]   Report Status PENDING   Incomplete  URINE CULTURE     Status: None   Collection Time    07/26/13 10:34 PM      Result Value Ref Range Status   Specimen Description URINE, RANDOM DIRECT ASPIRATE FROM KIDNEY   Final    Special Requests NONE   Final   Culture  Setup Time     Final   Value: 07/27/2013 04:48     Performed at [place]     Final   Value: >=100,000 COLONIES/ML     Performed at [facility]   Culture     Final   Value: PROTEUS MIRABILIS     Performed at [facility]   Report Status PENDING   Incomplete     Studies: Dg Abd 1 View  07/26/2013   CLINICAL DATA:  Abdominal distention.  EXAM: ABDOMEN - 1 VIEW  COMPARISON:  None.  FINDINGS: Supine abdomen shows gas scattered along a nondilated colon. There is no gaseous small bowel dilatation. Probe overlies the lower midline pelvis. Bones are demineralized with degenerative changes noted in the lower lumbar spine.  IMPRESSION: No evidence for bowel obstruction although there is diffuse gaseous filling of the colon and a component of underlying mild colonic ileus would be a consideration.   Electronically Signed   By: Misty Stanley M.D.   On: 07/26/2013 19:06  Ir Perc Nephrostomy Left  07/27/2013   CLINICAL DATA:  Left hydronephrosis and sepsis.  EXAM: 1. ULTRASOUND GUIDANCE FOR PUNCTURE OF THE LEFT RENAL COLLECTING SYSTEM. 2. LEFT PERCUTANEOUS NEPHROSTOMY TUBE PLACEMENT.  COMPARISON:  CT of the abdomen on 07/25/2013.  ANESTHESIA/SEDATION: 0.5 mg IV Versed; 25 mcg IV Fentanyl.  Total Moderate Sedation Time  15 minutes  CONTRAST:  5 ml Omnipaque 300  MEDICATIONS: No additional medications.  FLUOROSCOPY TIME:  36 seconds.  PROCEDURE: The procedure, risks, benefits, and alternatives were explained to the patient's daughter. Questions regarding the procedure were encouraged and answered. The patient's daughter understands and consents to the procedure.  The left flank region was prepped with Betadine in a sterile fashion, and a sterile drape was applied covering the operative field. A sterile gown and sterile gloves were used for the procedure. Local anesthesia was provided with 1% Lidocaine.  Ultrasound was used to  localize the left kidney. Under direct ultrasound guidance, an 18 gauge needle was advanced into the renal collecting system. Ultrasound image documentation was performed. Aspiration of urine sample was performed followed by contrast injection.  A guidewire was advanced into the collecting system through the needle. Percutaneous tract dilatation was then performed over the guidewire. A 10 French percutaneous nephrostomy tube was then advanced and formed in the collecting system. Catheter position was confirmed by fluoroscopy after contrast injection.  The catheter was secured at the skin with a Prolene retention suture and Stat-Lock device. A gravity bag was placed.  COMPLICATIONS: None.  FINDINGS: Ultrasound demonstrates severe left-sided hydronephrosis. Urine return was cloudy. A sample was sent for culture. The nephrostomy tube was formed at the level of the renal pelvis.  IMPRESSION: Placement of left percutaneous nephrostomy tube with 10 French tube advanced to the level of the renal pelvis. Urine return is cloudy and infected appearing. A sample of urine from the left kidney was sent for culture analysis.   Electronically Signed   By: Aletta Edouard M.D.   On: 07/27/2013 07:37   Ir US Guide Vasc Access Left  07/27/2013   CLINICAL DATA:  Left hydronephrosis and sepsis.  EXAM: 1. ULTRASOUND GUIDANCE FOR PUNCTURE OF THE LEFT RENAL COLLECTING SYSTEM. 2. LEFT PERCUTANEOUS NEPHROSTOMY TUBE PLACEMENT.  COMPARISON:  CT of the abdomen on 07/25/2013.  ANESTHESIA/SEDATION: 0.5 mg IV Versed; 25 mcg IV Fentanyl.  Total Moderate Sedation Time  15 minutes  CONTRAST:  5 ml Omnipaque 300  MEDICATIONS: No additional medications.  FLUOROSCOPY TIME:  36 seconds.  PROCEDURE: The procedure, risks, benefits, and alternatives were explained to the patient's daughter. Questions regarding the procedure were encouraged and answered. The patient's daughter understands and consents to the procedure.  The left flank region was prepped  with Betadine in a sterile fashion, and a sterile drape was applied covering the operative field. A sterile gown and sterile gloves were used for the procedure. Local anesthesia was provided with 1% Lidocaine.  Ultrasound was used to localize the left kidney. Under direct ultrasound guidance, an 18 gauge needle was advanced into the renal collecting system. Ultrasound image documentation was performed. Aspiration of urine sample was performed followed by contrast injection.  A guidewire was advanced into the collecting system through the needle. Percutaneous tract dilatation was then performed over the guidewire. A 10 French percutaneous nephrostomy tube was then advanced and formed in the collecting system. Catheter position was confirmed by fluoroscopy after contrast injection.  The catheter was secured at the skin with a Prolene retention suture and Stat-Lock  device. A gravity bag was placed.  COMPLICATIONS: None.  FINDINGS: Ultrasound demonstrates severe left-sided hydronephrosis. Urine return was cloudy. A sample was sent for culture. The nephrostomy tube was formed at the level of the renal pelvis.  IMPRESSION: Placement of left percutaneous nephrostomy tube with 10 French tube advanced to the level of the renal pelvis. Urine return is cloudy and infected appearing. A sample of urine from the left kidney was sent for culture analysis.   Electronically Signed   By: Aletta Edouard M.D.   On: 07/27/2013 07:37   Dg Chest Port 1 View  07/27/2013   CLINICAL DATA:  Worsening shortness of breath.  EXAM: PORTABLE CHEST - 1 VIEW  COMPARISON:  Chest radiograph performed 07/26/2013  FINDINGS: The lungs are well-aerated. Vascular congestion is noted, with peribronchial thickening. Mildly increased interstitial markings are again seen, possibly reflecting minimal interstitial edema. No pleural effusion or pneumothorax is identified.  The cardiomediastinal silhouette is within normal limits. No acute osseous  abnormalities are seen.  IMPRESSION: Vascular congestion, with peribronchial thickening. Mildly increased interstitial markings may reflect minimal interstitial edema.   Electronically Signed   By: Garald Balding M.D.   On: 07/27/2013 00:54   Dg Chest Port 1 View  07/26/2013   CLINICAL DATA:  Shortness of breath with chest pain and tachycardia. Fever. History of COPD and seizures.  EXAM: PORTABLE CHEST - 1 VIEW  COMPARISON:  Radiographs 07/25/2013 and 07/08/2013.  FINDINGS: 1751 hr. The heart size and mediastinal contours are stable. Accentuated interstitial markings compared with the prior studies may be related to portable technique, although mild superimposed edema is difficult to exclude. There is no airspace disease, pleural effusion or pneumothorax. The osseous structures appear unchanged.  IMPRESSION: Mild accentuation of interstitial markings, likely technical. No definite acute findings.   Electronically Signed   By: Camie Patience M.D.   On: 07/26/2013 18:06    Scheduled Meds: . antiseptic oral rinse  15 mL Mouth Rinse BID  . arformoterol  15 mcg Nebulization BID  . aspirin EC  81 mg Oral Daily  . atorvastatin  80 mg Oral q1800  . aztreonam  1 g Intravenous 3 times per day  . budesonide  0.5 mg Nebulization BID  . citalopram  10 mg Oral Daily  . [START ON 07/29/2013] levofloxacin (LEVAQUIN) IV  500 mg Intravenous Q48H  . LORazepam  0.5 mg Intravenous TID  . nystatin  5 mL Oral QID  . pantoprazole  40 mg Oral QAC breakfast  . potassium chloride  10 mEq Intravenous Q1 Hr x 2  . sodium chloride  3 mL Intravenous Q12H  . vancomycin  500 mg Intravenous Q24H   Continuous Infusions: . sodium chloride 100 mL/hr at 07/27/13 2000  . phenylephrine (NEO-SYNEPHRINE) Adult infusion 20 mcg/min (07/28/13 0600)    Principal Problem:   AKI (acute kidney injury) Active Problems:   Diarrhea   Abdominal pain, unspecified site   Hydronephrosis, left   Dehydration   Elevated troponin    Time  spent: >35 minutes     Kinnie Feil  [facility] Pager [phone]. If 7PM-7AM, please contact night-coverage at [email or web address], password [facility] 07/28/2013, 8:23 AM  LOS: 3 days

## 2013-07-28 NOTE — Progress Notes (Signed)
[facility] ADULT ICU REPLACEMENT PROTOCOL FOR AM LAB REPLACEMENT ONLY  The patient does apply for the [facility] Adult ICU Electrolyte Replacment Protocol based on the criteria listed below:   1. Is GFR >/= 40 ml/min? Yes.    Patient's GFR today is 56 2. Is urine output >/= 0.5 ml/kg/hr for the last 6 hours? Yes.   Patient's UOP is 0.5 ml/kg/hr 3. Is BUN < 60 mg/dL? Yes.    Patient's BUN today is 26 4. Abnormal electrolyte(s): Potassium 5. Ordered repletion with: Potassium per Protocol  Chyenne Sobczak P 07/28/2013 4:39 AM

## 2013-07-28 NOTE — Progress Notes (Signed)
Ref: Delphina Cahill, MD   Subjective:  Awake. Low platelets count. Bloody drainage from nephrostomy tube continues. Improved bloos pressure, off pressure agents. T max 99.7 F  Objective:  Vital Signs in the last 24 hours: Temp:  [98.1 F (36.7 C)-99.7 F (37.6 C)] 99.1 F (37.3 C) (07/24 1000) Pulse Rate:  [71-104] 104 (07/24 1000) Cardiac Rhythm:  [-] Normal sinus rhythm (07/24 0800) Resp:  [18-40] 23 (07/24 1000) BP: (80-124)/(47-83) 118/77 mmHg (07/24 1000) SpO2:  [93 %-97 %] 97 % (07/24 1000)  Physical Exam: BP Readings from Last 1 Encounters:  07/28/13 118/77    Wt Readings from Last 1 Encounters:  07/26/13 46.3 kg (102 lb 1.2 oz)    Weight change:   HEENT: [place]/AT, Eyes-Hazel, PERL, EOMI, Conjunctiva-Pink, Sclera-Non-icteric. Tongue-wet(purple from grape popsickle) Neck: No JVD, No bruit, Trachea midline. Lungs:  Clear, Bilateral. Cardiac:  Regular rhythm, normal S1 and S2, no S3.  Abdomen:  Soft, non-tender. Extremities:  No edema present. No cyanosis. No clubbing. CNS: AxOx2, Cranial nerves grossly intact, moves all 4 extremities.  Skin: Warm and dry.   Intake/Output from previous day: 07/23 0701 - 07/24 0700 In: 2009.3 [I.V.:1079.3; IV Piggyback:650] Out: 475 [Urine:475]    Lab Results: BMET    Component Value Date/Time   NA 136* 07/28/2013 0345   NA 135* 07/27/2013 0535   NA 138 07/26/2013 1830   K 3.4* 07/28/2013 0345   K 3.5* 07/27/2013 0535   K 2.9* 07/26/2013 1830   CL 106 07/28/2013 0345   CL 101 07/27/2013 0535   CL 102 07/26/2013 1830   CO2 19 07/28/2013 0345   CO2 18* 07/27/2013 0535   CO2 22 07/26/2013 1830   GLUCOSE 96 07/28/2013 0345   GLUCOSE 177* 07/27/2013 0535   GLUCOSE 85 07/26/2013 1830   BUN 26* 07/28/2013 0345   BUN 27* 07/27/2013 0535   BUN 30* 07/26/2013 1830   CREATININE 0.93 07/28/2013 0345   CREATININE 1.21* 07/27/2013 0535   CREATININE 1.33* 07/26/2013 1830   CALCIUM 7.6* 07/28/2013 0345   CALCIUM 7.8* 07/27/2013 0535   CALCIUM 7.7* 07/26/2013  1830   GFRNONAA 56* 07/28/2013 0345   GFRNONAA 41* 07/27/2013 0535   GFRNONAA 36* 07/26/2013 1830   GFRAA 65* 07/28/2013 0345   GFRAA 47* 07/27/2013 0535   GFRAA 42* 07/26/2013 1830   CBC    Component Value Date/Time   WBC 14.7* 07/28/2013 0345   RBC 3.54* 07/28/2013 0345   HGB 11.3* 07/28/2013 0345   HCT 33.6* 07/28/2013 0345   PLT 17* 07/28/2013 0817   MCV 94.9 07/28/2013 0345   MCH 31.9 07/28/2013 0345   MCHC 33.6 07/28/2013 0345   RDW 15.6* 07/28/2013 0345   LYMPHSABS 0.2* 07/25/2013 1225   MONOABS 0.4 07/25/2013 1225   EOSABS 0.0 07/25/2013 1225   BASOSABS 0.0 07/25/2013 1225   HEPATIC Function Panel  Recent Labs  07/25/13 1225 07/26/13 0505 07/27/13 0535  PROT 5.9* 5.3* 4.9*   HEMOGLOBIN A1C No components found with this basename: HGA1C,  MPG   CARDIAC ENZYMES Lab Results  Component Value Date   CKTOTAL 63 07/26/2013   CKMB 3.0 07/26/2013   TROPONINI <0.30 07/27/2013   TROPONINI 0.39* 07/27/2013   TROPONINI 0.75* 07/26/2013   BNP No results found for this basename: PROBNP,  in the last 8760 hours TSH  Recent Labs  11/14/12 0605 04/20/13 1622  TSH 0.686 0.635   CHOLESTEROL  Recent Labs  11/21/12 1100  CHOL 206*    Scheduled Meds: .  antiseptic oral rinse  15 mL Mouth Rinse BID  . arformoterol  15 mcg Nebulization BID  . atorvastatin  80 mg Oral q1800  . aztreonam  1 g Intravenous 3 times per day  . budesonide  0.5 mg Nebulization BID  . carvedilol  3.125 mg Oral BID WC  . citalopram  10 mg Oral Daily  . [START ON 07/29/2013] levofloxacin (LEVAQUIN) IV  500 mg Intravenous Q48H  . LORazepam  0.5 mg Intravenous TID  . nystatin  5 mL Oral QID  . pantoprazole  40 mg Oral QAC breakfast  . sodium chloride  3 mL Intravenous Q12H  . tiotropium  18 mcg Inhalation Daily  . vancomycin  500 mg Intravenous Q24H   Continuous Infusions: . sodium chloride 75 mL/hr at 07/28/13 0827   PRN Meds:.acetaminophen, acetaminophen, albuterol, ondansetron (ZOFRAN) IV,  ondansetron  Assessment/Plan: Sinus tachycardia- Improved  Fever -resolving  Acute pyelonephritis with sepsis, (gm negative most likely)  COPD  Arthritis  H/O stroke  H/O skin cancer  Dehydration  H/O seizures  H/O depression  Hypokalemia-Improving Thrombocytopenia  Continue medical treatment. Very small dose Coreg if tolerated. Will sign off. Re-consult as needed. Dr. Terrence Dupont on call for the weekend.   LOS: 3 days    Dixie Dials  MD  07/28/2013, 11:06 AM

## 2013-07-28 NOTE — Evaluation (Signed)
Clinical/Bedside Swallow Evaluation Patient Details  Name: Bridget Ball MRN: [id number] Date of Birth: 09-Sep-1928  Today's Date: 07/28/2013 Time: 0865-7846 SLP Time Calculation (min): 63 min  Past Medical History:  Past Medical History  Diagnosis Date  . COPD (chronic obstructive pulmonary disease)   . Arthritis   . Stroke     TIA four years ago  . Depression   . Seizures     x 2 last year due to bleeding to the brain.  . Memory difficulties   . Chronic diarrhea   . Hiatal hernia   . Cancer     Skin cancer   Past Surgical History:  Past Surgical History  Procedure Laterality Date  . Appendectomy    . Skin cancer excision    . Tubal ligation    . Colonoscopy N/A 05/24/2013    Procedure: COLONOSCOPY;  Surgeon: Rogene Houston, MD;  Location: [facility];  Service: Endoscopy;  Laterality: N/A;  100  . Esophagogastroduodenoscopy N/A 05/24/2013    Procedure: ESOPHAGOGASTRODUODENOSCOPY (EGD);  Surgeon: Rogene Houston, MD;  Location: [facility];  Service: Endoscopy;  Laterality: N/A;   HPI:  80 year old female admitted 07/25/13 due to somnolence, diarrhea and decreased po intake. PMH significant for COPD, TIA (2011), decreased memory, hiatal hernia, EGD (2015), seizures due to [facility]. BSE ordered to evaluate swallow function and safety and identify least restrictive diet.   Assessment / Plan / Recommendation Clinical Impression  Limited evaluation due to significant oral pain related to thrush/oral sores. Oral motor strength and function appear adequate. No overt s/s aspiration or clinical indication of airway compromise with thin liquid or puree consistencies, however, pt has hx COPD which increases risk of silent aspiration. Pt/daughter report no history of swallowing difficulty prior to admit. Solid not tested due to oral sores/pain. Pt appears appropriate to advance to puree consistencies and thin liquids at this time. As oral pain subsides, it is anticipated that pt  will be  able to advance to regular diet. ST will follow for assessment of diet tolerance. and trials of advanced consistencies as pt able to toleratte.    Aspiration Risk  Mild    Diet Recommendation Dysphagia 1 (Puree);Thin liquid   Liquid Administration via: Cup;Straw Medication Administration: Whole meds with liquid Supervision: Staff to assist with self feeding;Full supervision/cueing for compensatory strategies Compensations: Small sips/bites;Slow rate Postural Changes and/or Swallow Maneuvers: Seated upright 90 degrees;Upright 30-60 min after meal    Other  Recommendations Recommended Consults: OT self-feeding Oral Care Recommendations: Oral care BID   Follow Up Recommendations  None    Frequency and Duration min 1 x/week  1 week   Pertinent Vitals/Pain VSS, pain 11/10 mouth sores    SLP Swallow Goals  tolerate least restrictive diet, adherence to safe swallow precautions   Swallow Study Prior Functional Status   Decreased po intake prior to admit, but no reported difficulty swallowing. Pt has dentures, but is not currently wearing them due to oral pain.    General Date of Onset: 07/25/13 HPI: 80 year old female admitted 07/25/13 due to somnolence, diarrhea and decreased po intake. PMH significant for COPD, TIA (2011), decreased memory, hiatal hernia, EGD (2015), seizures due to [facility]. BSE ordered to evaluate swallow function and safety and identify least restrictive diet. Type of Study: Bedside swallow evaluation Previous Swallow Assessment: none found Diet Prior to this Study: Thin liquids Temperature Spikes Noted: No Respiratory Status: Room air History of Recent Intubation: No Behavior/Cognition: Alert;Cooperative;Pleasant mood  Oral Cavity - Dentition: Missing dentition (dentures available, but not wearing due to thrush) Self-Feeding Abilities: Total assist Patient Positioning: Upright in bed Baseline Vocal Quality: Clear Volitional Cough: Weak Volitional Swallow: Able to  elicit    Oral/Motor/Sensory Function Overall Oral Motor/Sensory Function: Appears within functional limits for tasks assessed   Ice Chips Ice chips: Within functional limits Presentation: Spoon   Thin Liquid Thin Liquid: Within functional limits Presentation: Straw    Nectar Thick Nectar Thick Liquid: Not tested   Honey Thick Honey Thick Liquid: Not tested   Puree Puree: Within functional limits Presentation: Ehrhardt B. Quentin Ore [facility], CCC-SLP [phone]  Solid: Not tested       Shonna Chock 07/28/2013,2:44 PM

## 2013-07-28 NOTE — Progress Notes (Signed)
Subjective: Is awake, alert.  One of her daughters is at her bedside.  Objective: Vital signs in last 24 hours: Temp:  [98.1 F (36.7 C)-99.7 F (37.6 C)] 99.1 F (37.3 C) (07/24 1000) Pulse Rate:  [71-104] 104 (07/24 1000) Resp:  [18-40] 23 (07/24 1000) BP: (85-124)/(47-83) 118/77 mmHg (07/24 1000) SpO2:  [93 %-97 %] 97 % (07/24 1000)  Intake/Output from previous day: 07/23 0701 - 07/24 0700 In: 2009.3 [I.V.:1079.3; IV Piggyback:650] Out: 475 [Urine:475] Intake/Output this shift: Total I/O In: 1061.3 [I.V.:961.3; IV Piggyback:100] Out: 445 [Urine:445]  Physical Exam:  Constitutional: Vital signs reviewed. WD WN in NAD   Eyes: PERRL, No scleral icterus.   Pulmonary/Chest: still using some accessory muscles  Urine is clear without significant blood Lab Results:  Recent Labs  07/26/13 1830 07/27/13 0535 07/28/13 0345  HGB 10.8* 11.2* 11.3*  HCT 32.4* 33.1* 33.6*   BMET  Recent Labs  07/27/13 0535 07/28/13 0345  NA 135* 136*  K 3.5* 3.4*  CL 101 106  CO2 18* 19  GLUCOSE 177* 96  BUN 27* 26*  CREATININE 1.21* 0.93  CALCIUM 7.8* 7.6*    Recent Labs  07/28/13 0817  INR 1.00   No results found for this basename: LABURIN,  in the last 72 hours Results for orders placed during the hospital encounter of 07/25/13  MRSA PCR SCREENING     Status: None   Collection Time    07/25/13  6:41 PM      Result Value Ref Range Status   MRSA by PCR NEGATIVE  NEGATIVE Final   Comment:            The GeneXpert MRSA Assay (FDA     approved for NASAL specimens     only), is one component of a     comprehensive MRSA colonization     surveillance program. It is not     intended to diagnose MRSA     infection nor to guide or     monitor treatment for     MRSA infections.  URINE CULTURE     Status: None   Collection Time    07/26/13  4:00 PM      Result Value Ref Range Status   Specimen Description URINE, CLEAN CATCH   Final   Special Requests NONE   Final    Culture  Setup Time     Final   Value: 07/26/2013 19:19     Performed at [place]     Final   Value: >=100,000 COLONIES/ML     Performed at [facility]   Culture     Final   Value: PROTEUS MIRABILIS     Performed at [facility]   Report Status PENDING   Incomplete  URINE CULTURE     Status: None   Collection Time    07/26/13  4:00 PM      Result Value Ref Range Status   Specimen Description URINE, CLEAN CATCH   Final   Special Requests NONE   Final   Culture  Setup Time     Final   Value: 07/27/2013 04:47     Performed at [place]     Final   Value: >=100,000 COLONIES/ML     Performed at [facility]   Culture     Final   Value: PROTEUS MIRABILIS     Performed at [facility]  Report Status PENDING   Incomplete  CULTURE, BLOOD (ROUTINE X 2)     Status: None   Collection Time    07/26/13  5:52 PM      Result Value Ref Range Status   Specimen Description BLOOD LEFT HAND   Final   Special Requests BOTTLES DRAWN AEROBIC AND ANAEROBIC 10 CC   Final   Culture  Setup Time     Final   Value: 07/26/2013 22:01     Performed at [facility]   Culture     Final   Value: PROTEUS MIRABILIS     GRAM POSITIVE COCCI IN PAIRS     Note: Gram Stain Report Called to,Read Back By and Verified With: STEPHANIE D@10 :34AM ON 07/27/13 BY DANTS     Performed at [facility]   Report Status PENDING   Incomplete  CULTURE, BLOOD (ROUTINE X 2)     Status: None   Collection Time    07/26/13  5:57 PM      Result Value Ref Range Status   Specimen Description BLOOD LEFT ARM   Final   Special Requests BOTTLES DRAWN AEROBIC AND ANAEROBIC 10 CC   Final   Culture  Setup Time     Final   Value: 07/26/2013 22:01     Performed at [facility]   Culture     Final   Value: PROTEUS MIRABILIS     GRAM POSITIVE COCCI IN PAIRS     Note: Gram Stain Report Called to,Read Back By and Verified With:  STEPHANIE D@10 :34AM ON 07/27/13 BY DANTS     Performed at [facility]   Report Status PENDING   Incomplete  URINE CULTURE     Status: None   Collection Time    07/26/13 10:34 PM      Result Value Ref Range Status   Specimen Description URINE, RANDOM DIRECT ASPIRATE FROM KIDNEY   Final   Special Requests NONE   Final   Culture  Setup Time     Final   Value: 07/27/2013 04:48     Performed at [place]     Final   Value: >=100,000 COLONIES/ML     Performed at [facility]   Culture     Final   Value: PROTEUS MIRABILIS     Performed at [facility]   Report Status PENDING   Incomplete    Studies/Results: Dg Chest 1 View  07/28/2013   CLINICAL DATA:  Weakness.  Edema.  EXAM: CHEST - 1 VIEW  COMPARISON:  07/27/2013  FINDINGS: There is irregular interstitial thickening, which appears minimally increased from the previous day's study. Basilar coarse reticular and discoid opacity, likely atelectasis, has increased and there are small effusions which have increased from the prior exam. No focal lung consolidation. No other change.  IMPRESSION: Pulmonary edema with mild worsening since the previous day's study.   Electronically Signed   By: Lajean Manes M.D.   On: 07/28/2013 09:03   Dg Abd 1 View  07/26/2013   CLINICAL DATA:  Abdominal distention.  EXAM: ABDOMEN - 1 VIEW  COMPARISON:  None.  FINDINGS: Supine abdomen shows gas scattered along a nondilated colon. There is no gaseous small bowel dilatation. Probe overlies the lower midline pelvis. Bones are demineralized with degenerative changes noted in the lower lumbar spine.  IMPRESSION: No evidence for bowel obstruction although there is diffuse gaseous filling of the colon and a component  of underlying mild colonic ileus would be a consideration.   Electronically Signed   By: Misty Stanley M.D.   On: 07/26/2013 19:06   Ir Perc Nephrostomy Left  07/27/2013   CLINICAL DATA:  Left hydronephrosis  and sepsis.  EXAM: 1. ULTRASOUND GUIDANCE FOR PUNCTURE OF THE LEFT RENAL COLLECTING SYSTEM. 2. LEFT PERCUTANEOUS NEPHROSTOMY TUBE PLACEMENT.  COMPARISON:  CT of the abdomen on 07/25/2013.  ANESTHESIA/SEDATION: 0.5 mg IV Versed; 25 mcg IV Fentanyl.  Total Moderate Sedation Time  15 minutes  CONTRAST:  5 ml Omnipaque 300  MEDICATIONS: No additional medications.  FLUOROSCOPY TIME:  36 seconds.  PROCEDURE: The procedure, risks, benefits, and alternatives were explained to the patient's daughter. Questions regarding the procedure were encouraged and answered. The patient's daughter understands and consents to the procedure.  The left flank region was prepped with Betadine in a sterile fashion, and a sterile drape was applied covering the operative field. A sterile gown and sterile gloves were used for the procedure. Local anesthesia was provided with 1% Lidocaine.  Ultrasound was used to localize the left kidney. Under direct ultrasound guidance, an 18 gauge needle was advanced into the renal collecting system. Ultrasound image documentation was performed. Aspiration of urine sample was performed followed by contrast injection.  A guidewire was advanced into the collecting system through the needle. Percutaneous tract dilatation was then performed over the guidewire. A 10 French percutaneous nephrostomy tube was then advanced and formed in the collecting system. Catheter position was confirmed by fluoroscopy after contrast injection.  The catheter was secured at the skin with a Prolene retention suture and Stat-Lock device. A gravity bag was placed.  COMPLICATIONS: None.  FINDINGS: Ultrasound demonstrates severe left-sided hydronephrosis. Urine return was cloudy. A sample was sent for culture. The nephrostomy tube was formed at the level of the renal pelvis.  IMPRESSION: Placement of left percutaneous nephrostomy tube with 10 French tube advanced to the level of the renal pelvis. Urine return is cloudy and infected  appearing. A sample of urine from the left kidney was sent for culture analysis.   Electronically Signed   By: Aletta Edouard M.D.   On: 07/27/2013 07:37   Ir US Guide Vasc Access Left  07/27/2013   CLINICAL DATA:  Left hydronephrosis and sepsis.  EXAM: 1. ULTRASOUND GUIDANCE FOR PUNCTURE OF THE LEFT RENAL COLLECTING SYSTEM. 2. LEFT PERCUTANEOUS NEPHROSTOMY TUBE PLACEMENT.  COMPARISON:  CT of the abdomen on 07/25/2013.  ANESTHESIA/SEDATION: 0.5 mg IV Versed; 25 mcg IV Fentanyl.  Total Moderate Sedation Time  15 minutes  CONTRAST:  5 ml Omnipaque 300  MEDICATIONS: No additional medications.  FLUOROSCOPY TIME:  36 seconds.  PROCEDURE: The procedure, risks, benefits, and alternatives were explained to the patient's daughter. Questions regarding the procedure were encouraged and answered. The patient's daughter understands and consents to the procedure.  The left flank region was prepped with Betadine in a sterile fashion, and a sterile drape was applied covering the operative field. A sterile gown and sterile gloves were used for the procedure. Local anesthesia was provided with 1% Lidocaine.  Ultrasound was used to localize the left kidney. Under direct ultrasound guidance, an 18 gauge needle was advanced into the renal collecting system. Ultrasound image documentation was performed. Aspiration of urine sample was performed followed by contrast injection.  A guidewire was advanced into the collecting system through the needle. Percutaneous tract dilatation was then performed over the guidewire. A 10 French percutaneous nephrostomy tube was then advanced and formed in  the collecting system. Catheter position was confirmed by fluoroscopy after contrast injection.  The catheter was secured at the skin with a Prolene retention suture and Stat-Lock device. A gravity bag was placed.  COMPLICATIONS: None.  FINDINGS: Ultrasound demonstrates severe left-sided hydronephrosis. Urine return was cloudy. A sample was sent for  culture. The nephrostomy tube was formed at the level of the renal pelvis.  IMPRESSION: Placement of left percutaneous nephrostomy tube with 10 French tube advanced to the level of the renal pelvis. Urine return is cloudy and infected appearing. A sample of urine from the left kidney was sent for culture analysis.   Electronically Signed   By: Aletta Edouard M.D.   On: 07/27/2013 07:37   Dg Chest Port 1 View  07/27/2013   CLINICAL DATA:  Worsening shortness of breath.  EXAM: PORTABLE CHEST - 1 VIEW  COMPARISON:  Chest radiograph performed 07/26/2013  FINDINGS: The lungs are well-aerated. Vascular congestion is noted, with peribronchial thickening. Mildly increased interstitial markings are again seen, possibly reflecting minimal interstitial edema. No pleural effusion or pneumothorax is identified.  The cardiomediastinal silhouette is within normal limits. No acute osseous abnormalities are seen.  IMPRESSION: Vascular congestion, with peribronchial thickening. Mildly increased interstitial markings may reflect minimal interstitial edema.   Electronically Signed   By: Garald Balding M.D.   On: 07/27/2013 00:54   Dg Chest Port 1 View  07/26/2013   CLINICAL DATA:  Shortness of breath with chest pain and tachycardia. Fever. History of COPD and seizures.  EXAM: PORTABLE CHEST - 1 VIEW  COMPARISON:  Radiographs 07/25/2013 and 07/08/2013.  FINDINGS: 1751 hr. The heart size and mediastinal contours are stable. Accentuated interstitial markings compared with the prior studies may be related to portable technique, although mild superimposed edema is difficult to exclude. There is no airspace disease, pleural effusion or pneumothorax. The osseous structures appear unchanged.  IMPRESSION: Mild accentuation of interstitial markings, likely technical. No definite acute findings.   Electronically Signed   By: Camie Patience M.D.   On: 07/26/2013 18:06    Assessment/Plan:   Status post percutaneous drainage of an  infected, obstructed left renal system.  Culture is growing Proteus.  Her clinical situation is improving, but she does have significant thrombocytopenia..  For now, she does need her tube left in.  Following discharge, we can more thoroughly evaluate the cause of obstruction.   LOS: 3 days   Franchot Gallo M 07/28/2013, 12:22 PM

## 2013-07-28 NOTE — Progress Notes (Signed)
[facility] PROGRESS NOTE  Bridget Ball LTR:[id number] DOB: 1928-05-31 DOA: 07/25/2013 PCP: Delphina Cahill, MD  Assessment/Plan: Elevated d dimer, hypoxia r/o PE; obtain CTA d/w risk, benefits with the patient, her family daughter at length;  Also TF platelets, since significant drop with risk of bleeding; CXR some edema, diurese as needed   D/w patient, family, agreed; all questions answered   Bridget Ball  [facility] Pager [phone]. If 7PM-7AM, please contact night-coverage at [email or web address], password [facility] 07/28/2013, 1:14 PM  LOS: 3 days

## 2013-07-28 NOTE — Progress Notes (Signed)
Nsg note: 1800- Pt becoming increasingly confused and agitated. She is expressing a feeling of impending doom per daughter. Her work of breath has increased with O2 sat 95% on RA. Pt placed on 2L O2 Nora. MD called and received orders for Lasix and decrease IV fluids. Will continue to monitor.

## 2013-07-28 NOTE — Progress Notes (Signed)
PULMONARY / CRITICAL CARE MEDICINE   Name: Bridget Ball MRN: [id number] DOB: [birth year]    ADMISSION DATE:  07/25/2013 CONSULTATION DATE:  07/26/2013  REFERRING MD :  Tyrell Antonio  CHIEF COMPLAINT:  Respiratory Distress  INITIAL PRESENTATION: 80 y.o.f who presented to [facility]  On 7/21 w/ cc: abdominal pain, diarrhea, poor oral intake. Dx eval c/w left hydro. Transferred to [facility] from [facility] for consideration of perc drainage.  On 7/22,  She was transferred to ICU increased resp distress on 7/22 in setting of abd pain   STUDIES:  7/17 CT abdo >>> stable hepatic cysts, cholelithiasis without inflammation, new severe left hydronephrosis concerning for UPJ stenosis. 7/21 CT abdo >>> stable left sided UPJ obstruction with severe hydro, cholelithiasis, stable hepatic cysts.  SIGNIFICANT EVENTS: 7/17 - initial presentation to [facility] ED, discharged home. 7/21 - presented again to [facility] ED for same symptoms (abd pain) 7/22 - transferred to [facility] for urological evaluation,- moved to ICU for respiratory distress- hypotensive in setting of sepsis, treated w/ crystalloid resuscitation & neo infusion. Percutaneous nephrostomy tube placed, cloudy and bloody drainage. Trop I elevated, cards consulted for tachycardia.  7/23 - pain resolved. Still hypotensive but weaning pressors.  7/24 - off pressors  SUBJECTIVE:  C/o wheezing.  Abd pain better.  VITAL SIGNS: Temp:  [98.1 F (36.7 C)-99.7 F (37.6 C)] 98.8 F (37.1 C) (07/24 0800) Pulse Rate:  [71-95] 95 (07/24 0800) Resp:  [18-40] 40 (07/24 0800) BP: (80-124)/(47-83) 124/83 mmHg (07/24 0800) SpO2:  [93 %-97 %] 97 % (07/24 0800) Room air  INTAKE / OUTPUT:  Intake/Output Summary (Last 24 hours) at 07/28/13 1000 Last data filed at 07/28/13 0800  Gross per 24 hour  Intake 1125.59 ml  Output    690 ml  Net 435.59 ml    PHYSICAL EXAMINATION: General: mild increased WOB Neuro: follows commands HEENT: poor dentition Cardiovascular: regular, tachycardic Lungs:  decreased breath sounds, b/l faint expiratory wheeze Abdomen: soft, non tender, Lt nephrostomy drain Musculoskeletal: no edema Skin: no rashes   LABS:  CBC  Recent Labs Lab 07/26/13 1830 07/27/13 0535 07/28/13 0345 07/28/13 0817  WBC 3.1* 20.4* 14.7*  --   HGB 10.8* 11.2* 11.3*  --   HCT 32.4* 33.1* 33.6*  --   PLT PLATELET CLUMPS NOTED ON SMEAR, COUNT APPEARS DECREASED 42* 21* 17*   BMET  Recent Labs Lab 07/26/13 1830 07/27/13 0535 07/28/13 0345  NA 138 135* 136*  K 2.9* 3.5* 3.4*  CL 102 101 106  CO2 22 18* 19  BUN 30* 27* 26*  CREATININE 1.33* 1.21* 0.93  GLUCOSE 85 177* 96   Electrolytes  Recent Labs Lab 07/26/13 1830 07/27/13 0535 07/28/13 0345  CALCIUM 7.7* 7.8* 7.6*  MG  --  2.4  --   PHOS  --  2.1*  --    Sepsis Markers  Recent Labs Lab 07/25/13 1252 07/26/13 2000  LATICACIDVEN 1.6 2.3*   ABG  Recent Labs Lab 07/26/13 1731  PHART 7.275*  PCO2ART 52.2*  PO2ART 141.0*   Liver Enzymes  Recent Labs Lab 07/25/13 1225 07/26/13 0505 07/27/13 0535  AST 79* 40* 62*  ALT 62* 48* 49*  ALKPHOS 63 76 174*  BILITOT 0.4 0.3 0.7  ALBUMIN 2.6* 2.2* 1.8*   Cardiac Enzymes  Recent Labs Lab 07/26/13 1752 07/27/13 0114 07/27/13 0540  TROPONINI 0.75* 0.39* <0.30   Imaging Dg Chest Port 1 View  07/27/2013   CLINICAL DATA:  Worsening shortness of breath.  EXAM: PORTABLE CHEST -  1 VIEW  COMPARISON:  Chest radiograph performed 07/26/2013  FINDINGS: The lungs are well-aerated. Vascular congestion is noted, with peribronchial thickening. Mildly increased interstitial markings are again seen, possibly reflecting minimal interstitial edema. No pleural effusion or pneumothorax is identified.  The cardiomediastinal silhouette is within normal limits. No acute osseous abnormalities are seen.  IMPRESSION: Vascular congestion, with peribronchial thickening. Mildly increased interstitial markings may reflect minimal interstitial edema.   Electronically  Signed   By: Garald Balding M.D.   On: 07/27/2013 00:54     ASSESSMENT / PLAN:  PULMONARY A: Acute hypoxic/hypercapnic respiratory failure 2nd to septic shock and COPD exacerbation. P:   Continue brovana/pulmicort Add spiriva 7/24 Might need systemic steroids if not improved Oxygen to keep SaO2 90 to 94% F/u CXR as needed  CARDIOVASCULAR A:  Septic shock - off pressors 7/24. Chronic hypotension - baseline SBP in 90's. Elevated troponin - likely demand ischemia in setting of septic shock. Chronic diastolic heart failure. P:  Goal SBP > 90 Continue IV fluids  RENAL A:   Lt UPJ obstruction with hydronephrosis. Hypokalemia. AKI 2nd to ATN from septic shock, metabolic acidosis >> resolved. P:   F/u and replace electrolytes as needed Monitor renal fx, urine outpt   GASTROINTESTINAL A:  Hx of GERD. Nutrition. Diarrhea >> resolved. P:   Protonix Clear liquid diet >> advance per primary team  HEMATOLOGIC A:   Anemia of critical illness and chronic disease. Thrombocytopenia - likely from sepsis. P:  F/u CBC Transfuse for Hb < 7, PLT < 10,000, or bleeding D/c ASA SCD for DVT prevention F/u HIT panel 7/24 >>  INFECTIOUS A:   Septic shock 2nd to Lt hydronephrosis s/o Left percutaneous nephrostomy tube 7/22. P:   Blood Cx x2 7/22>> GNR, GPC in pairs >> UC 7/22>>> Proteus mirabilis Abx: Aztreonam, start date 7/22, day 3/x Abx: levaquin start date 7/22, day 3/x Abx: vancomycin start date 7/22, day 3/x  ENDOCRINE A:  Chronic pancreatitis with stable stone - per EDP notes, scheduled to be removed in 2 weeks at [facility]. P:   No intervention required.  NEUROLOGIC A:  No acute issues P:   Monitor mental status  Goals of Care >> DNR/DNI.  Hemodynamics improved, and off pressors.  Defer further management to primary team.  PCCM will sign off.  Please call if further help needed.  Chesley Mires, MD [facility] Pulmonary/Critical Care 07/28/2013, 10:00 AM Pager:   [phone] After 3pm call: [phone]

## 2013-07-28 NOTE — Progress Notes (Signed)
Agree.  Will continue to monitor PCN output and defer other management to Urology.

## 2013-07-29 DIAGNOSIS — E8779 Other fluid overload: Secondary | ICD-10-CM

## 2013-07-29 DIAGNOSIS — N390 Urinary tract infection, site not specified: Secondary | ICD-10-CM

## 2013-07-29 DIAGNOSIS — K861 Other chronic pancreatitis: Secondary | ICD-10-CM

## 2013-07-29 DIAGNOSIS — Z880 Allergy status to penicillin: Secondary | ICD-10-CM

## 2013-07-29 LAB — URINE CULTURE
Colony Count: >=100000
Colony Count: >=100000

## 2013-07-29 LAB — CBC
HEMATOCRIT: 36.4 % (ref 36.0–46.0)
Hemoglobin: 12.3 g/dL (ref 12.0–15.0)
MCH: 31.7 pg (ref 26.0–34.0)
MCHC: 33.8 g/dL (ref 30.0–36.0)
MCV: 93.8 fL (ref 78.0–100.0)
Platelets: 39 10*3/uL — ABNORMAL LOW (ref 150–400)
RBC: 3.88 MIL/uL (ref 3.87–5.11)
RDW: 15.7 % — ABNORMAL HIGH (ref 11.5–15.5)
WBC: 16.2 10*3/uL — ABNORMAL HIGH (ref 4.0–10.5)

## 2013-07-29 LAB — BASIC METABOLIC PANEL
Anion gap: 9 (ref 5–15)
BUN: 23 mg/dL (ref 6–23)
CO2: 23 mEq/L (ref 19–32)
CREATININE: 0.82 mg/dL (ref 0.50–1.10)
Calcium: 7.9 mg/dL — ABNORMAL LOW (ref 8.4–10.5)
Chloride: 104 mEq/L (ref 96–112)
GFR, EST AFRICAN AMERICAN: 75 mL/min — AB (ref >90)
GFR, EST NON AFRICAN AMERICAN: 65 mL/min — AB (ref >90)
Glucose, Bld: 89 mg/dL (ref 70–99)
POTASSIUM: 3 meq/L — AB (ref 3.7–5.3)
Sodium: 136 mEq/L — ABNORMAL LOW (ref 137–147)

## 2013-07-29 LAB — CLOSTRIDIUM DIFFICILE BY PCR: Toxigenic C. Difficile by PCR: NEGATIVE

## 2013-07-29 LAB — VANCOMYCIN, TROUGH: Vancomycin Tr: 6 ug/mL — ABNORMAL LOW (ref 10.0–20.0)

## 2013-07-29 MED ORDER — DEXTROSE 5 % IV SOLN
100.0000 mg | Freq: Once | INTRAVENOUS | Status: AC
  Administered 2013-07-29: 100 mg via INTRAVENOUS
  Filled 2013-07-29: qty 100

## 2013-07-29 MED ORDER — BUDESONIDE 0.25 MG/2ML IN SUSP: 0.5000 mg | Freq: Once | RESPIRATORY_TRACT | Status: DC

## 2013-07-29 MED ORDER — LEVOFLOXACIN IN D5W 750 MG/150ML IV SOLN
750.0000 mg | INTRAVENOUS | Status: DC
  Administered 2013-07-29: 750 mg via INTRAVENOUS
  Filled 2013-07-29: qty 150

## 2013-07-29 MED ORDER — LORAZEPAM 0.5 MG PO TABS
0.5000 mg | ORAL_TABLET | Freq: Three times a day (TID) | ORAL | Status: DC
  Administered 2013-07-29: 0.5 mg via ORAL
  Filled 2013-07-29: qty 1

## 2013-07-29 MED ORDER — FUROSEMIDE 10 MG/ML IJ SOLN
20.0000 mg | Freq: Once | INTRAMUSCULAR | Status: AC
  Administered 2013-07-29: 20 mg via INTRAVENOUS
  Filled 2013-07-29: qty 2

## 2013-07-29 MED ORDER — BUDESONIDE 0.5 MG/2ML IN SUSP
0.5000 mg | Freq: Two times a day (BID) | RESPIRATORY_TRACT | Status: DC
  Administered 2013-07-29 – 2013-08-02 (×9): 0.5 mg via RESPIRATORY_TRACT
  Filled 2013-07-29 (×17): qty 2

## 2013-07-29 MED ORDER — POTASSIUM CHLORIDE CRYS ER 20 MEQ PO TBCR
40.0000 meq | EXTENDED_RELEASE_TABLET | Freq: Two times a day (BID) | ORAL | Status: DC
  Administered 2013-07-29 – 2013-08-03 (×11): 40 meq via ORAL
  Filled 2013-07-29 (×12): qty 2

## 2013-07-29 MED ORDER — DEXTROSE 5 % IV SOLN
1.0000 g | INTRAVENOUS | Status: DC
  Administered 2013-07-29 – 2013-08-02 (×5): 1 g via INTRAVENOUS
  Filled 2013-07-29 (×5): qty 10

## 2013-07-29 MED ORDER — BUDESONIDE 0.25 MG/2ML IN SUSP
0.5000 mg | Freq: Once | RESPIRATORY_TRACT | Status: AC
  Administered 2013-07-29: 0.5 mg via RESPIRATORY_TRACT
  Filled 2013-07-29: qty 4

## 2013-07-29 NOTE — Consult Note (Signed)
[facility]  Date of Admission:  07/25/2013  Date of Consult:  07/29/2013  Reason for Consult: Urosepsis Referring Physician: Daleen Bo  Impression/Recommendation Urosepsis  P mirabilis (I unsayn, cipro; R-bactrim) in BCx and UCx Hydronephrosis with percutaneous drain 7-22 Fluid overload (on CXR) Chronic pancreatitis with stone Chronic Diarrhea/Microscopic colits PEN allergy ARF DNR/DNI  Would: Stop all her anbx and change her to ceftriaxone Give test dose first Discussed with family  Comment- Her PEN allergy is difficult to make sense of given how long ago it occurred. I explained to her family that there was a ~8% chance of cross reactivity as well as that drug preparations are much purer now than they were previously. My great appreciation to pharmacy for their assistance with drug dosing. I explained this to nursing as well.   Thank you so much for this interesting consult,   Bridget Ball (pager) [phone] [email or web address]  Bridget Ball is an 80 y.o. female.  HPI: 80 yo F adm on 7-21 wit diarrhea and confusion. She had CT scan done 7-17 in ED after being seen for abd pain (Stable hepatic cysts. Stool is noted throughout the colon concerning for constipation. Cholelithiasis without inflammation. Interval development of severe left hydronephrosis concerning for ureteropelvic junction stenosis. No obstructing calculus is noted.).  She then had f/u with GI who advised her to go to the ED. In ED 7-21, she was found to have an elevated Cr and no change in her CT abdomen. (Stable left-sided UPJ obstruction with severe hydronephrosis. Chololithiasis. Bibasilar atelectasis. Stable hepatic cysts). In hospital she developed temp to 104.4. She was transferred to [facility] ICU 7-22 from [facility] due to hypotension, tachypnea, worsened mental status. He required inotropic support. She was seen by urology and then underwent percutaneous nephrostomy by IR  (bloody, cloudy urine). He was started on aztreonam/levaquin (due to PEN allergy) and vancomycin.    Past Medical History  Diagnosis Date  . COPD (chronic obstructive pulmonary disease)   . Arthritis   . Stroke     TIA four years ago  . Depression   . Seizures     x 2 last year due to bleeding to the brain.  . Memory difficulties   . Chronic diarrhea   . Hiatal hernia   . Cancer     Skin cancer    Past Surgical History  Procedure Laterality Date  . Appendectomy    . Skin cancer excision    . Tubal ligation    . Colonoscopy N/A 05/24/2013    Procedure: COLONOSCOPY;  Surgeon: Rogene Houston, MD;  Location: [facility];  Service: Endoscopy;  Laterality: N/A;  100  . Esophagogastroduodenoscopy N/A 05/24/2013    Procedure: ESOPHAGOGASTRODUODENOSCOPY (EGD);  Surgeon: Rogene Houston, MD;  Location: [facility];  Service: Endoscopy;  Laterality: N/A;     Allergies  Allergen Reactions  . Penicillins Anaphylaxis  . Dilaudid [Hydromorphone Hcl]     Severe sedation per son   Her PEN allergy is that her throat "swelled shut" when she was 80 years old after anbx.  Medications:  Scheduled: . antiseptic oral rinse  15 mL Mouth Rinse BID  . arformoterol  15 mcg Nebulization BID  . atorvastatin  80 mg Oral q1800  . aztreonam  1 g Intravenous 3 times per day  . budesonide (PULMICORT) nebulizer solution  0.5 mg Nebulization BID  . carvedilol  3.125 mg Oral BID WC  . citalopram  10 mg Oral  Daily  . levofloxacin (LEVAQUIN) IV  750 mg Intravenous Q48H  . LORazepam  0.5 mg Oral TID  . nystatin  5 mL Oral QID  . pantoprazole  40 mg Oral QAC breakfast  . potassium chloride  40 mEq Oral BID  . sodium chloride  3 mL Intravenous Q12H  . tiotropium  18 mcg Inhalation Daily  . vancomycin  500 mg Intravenous Q24H    Abtx:  Anti-infectives   Start     Dose/Rate Route Frequency Ordered Stop   07/29/13 1000  levofloxacin (LEVAQUIN) IVPB 750 mg     750 mg 100 mL/hr over 90 Minutes  Intravenous Every 48 hours 07/29/13 0811     07/29/13 0800  levofloxacin (LEVAQUIN) IVPB 500 mg  Status:  Discontinued     500 mg 100 mL/hr over 60 Minutes Intravenous Every 48 hours 07/27/13 0748 07/29/13 0811   07/27/13 1800  vancomycin (VANCOCIN) 500 mg in sodium chloride 0.9 % 100 mL IVPB     500 mg 100 mL/hr over 60 Minutes Intravenous Every 24 hours 07/27/13 1417     07/27/13 0900  levofloxacin (LEVAQUIN) IVPB 750 mg     750 mg 100 mL/hr over 90 Minutes Intravenous  Once 07/27/13 0748 07/27/13 1055   07/27/13 0600  aztreonam (AZACTAM) 1 g in dextrose 5 % 50 mL IVPB     1 g 100 mL/hr over 30 Minutes Intravenous 3 times per day 07/26/13 1938     07/27/13 0200  metroNIDAZOLE (FLAGYL) IVPB 500 mg  Status:  Discontinued     500 mg 100 mL/hr over 60 Minutes Intravenous Every 8 hours 07/26/13 1822 07/26/13 1922   07/26/13 1945  aztreonam (AZACTAM) 2 g in dextrose 5 % 50 mL IVPB     2 g 100 mL/hr over 30 Minutes Intravenous NOW 07/26/13 1932 07/26/13 2148   07/26/13 1830  vancomycin (VANCOCIN) 500 mg in sodium chloride 0.9 % 100 mL IVPB  Status:  Discontinued     500 mg 100 mL/hr over 60 Minutes Intravenous Every 24 hours 07/26/13 1817 07/27/13 1038   07/26/13 1830  metroNIDAZOLE (FLAGYL) IVPB 500 mg  Status:  Discontinued     500 mg 100 mL/hr over 60 Minutes Intravenous  Once 07/26/13 1822 07/26/13 1922   07/26/13 1830  levofloxacin (LEVAQUIN) IVPB 750 mg  Status:  Discontinued     750 mg 100 mL/hr over 90 Minutes Intravenous Every 48 hours 07/26/13 1823 07/26/13 1930      Total days of antibiotics 3 (vanco/azactam, levaquin)          Social History:  reports that she quit smoking about 15 years ago. She does not have any smokeless tobacco history on file. She reports that she drinks about 4.2 ounces of alcohol per week. She reports that she does not use illicit drugs.  Family History  Problem Relation Age of Onset  . Diabetes Mother     General ROS: 40# wt loss, chronic  diarrhea, abd pain better, off pressors now, see HPI.   Blood pressure 141/83, pulse 98, temperature 98.8 F (37.1 C), temperature source Core (Comment), resp. rate 24, height 5' 1"  (1.549 m), weight 46.3 kg (102 lb 1.2 oz), SpO2 99.00%. General appearance: alert, cooperative, no distress and pale Eyes: negative findings: conjunctivae and sclerae normal and pupils equal, round, reactive to light and accomodation Throat: normal findings: oropharynx pink & moist without lesions or evidence of thrush Neck: no adenopathy and supple, symmetrical, trachea midline Lungs: clear to  auscultation bilaterally Heart: regular rate and rhythm Abdomen: normal findings: bowel sounds normal and mild L sided tenderness, no r/g. Extremities: edema none Neurologic: Mental status: Alert, oriented, thought content appropriate   Results for orders placed during the hospital encounter of 07/25/13 (from the past 48 hour(s))  BASIC METABOLIC PANEL     Status: Abnormal   Collection Time    07/28/13  3:45 AM      Result Value Ref Range   Sodium 136 (*) 137 - 147 mEq/L   Potassium 3.4 (*) 3.7 - 5.3 mEq/L   Chloride 106  96 - 112 mEq/L   CO2 19  19 - 32 mEq/L   Glucose, Bld 96  70 - 99 mg/dL   BUN 26 (*) 6 - 23 mg/dL   Creatinine, Ser 0.93  0.50 - 1.10 mg/dL   Calcium 7.6 (*) 8.4 - 10.5 mg/dL   GFR calc non Af Amer 56 (*) >90 mL/min   GFR calc Af Amer 65 (*) >90 mL/min   Comment: (NOTE)     The eGFR has been calculated using the CKD EPI equation.     This calculation has not been validated in all clinical situations.     eGFR's persistently <90 mL/min signify possible Chronic Kidney     Disease.   Anion gap 11  5 - 15  CBC     Status: Abnormal   Collection Time    07/28/13  3:45 AM      Result Value Ref Range   WBC 14.7 (*) 4.0 - 10.5 K/uL   RBC 3.54 (*) 3.87 - 5.11 MIL/uL   Hemoglobin 11.3 (*) 12.0 - 15.0 g/dL   HCT 33.6 (*) 36.0 - 46.0 %   MCV 94.9  78.0 - 100.0 fL   MCH 31.9  26.0 - 34.0 pg   MCHC  33.6  30.0 - 36.0 g/dL   RDW 15.6 (*) 11.5 - 15.5 %   Platelets 21 (*) 150 - 400 K/uL   Comment: SPECIMEN CHECKED FOR CLOTS     PLATELET COUNT CONFIRMED BY SMEAR     LARGE PLATELETS PRESENT     REPEATED TO VERIFY     DELTA CHECK NOTED     CRITICAL RESULT CALLED TO, READ BACK BY AND VERIFIED WITH:     M REEVES AT 0425 ON 07.24.2015 BY NBROOKS  ABO/RH     Status: None   Collection Time    07/28/13  3:45 AM      Result Value Ref Range   ABO/RH(D) A POS    DIC (DISSEMINATED INTRAVASCULAR COAGULATION) PANEL     Status: Abnormal   Collection Time    07/28/13  8:17 AM      Result Value Ref Range   Prothrombin Time 13.2  11.6 - 15.2 seconds   INR 1.00  0.00 - 1.49   aPTT 26  24 - 37 seconds   Fibrinogen 628 (*) 204 - 475 mg/dL   D-Dimer, Quant 10.60 (*) 0.00 - 0.48 ug/mL-FEU   Comment:            AT THE INHOUSE ESTABLISHED CUTOFF     VALUE OF 0.48 ug/mL FEU,     THIS ASSAY HAS BEEN DOCUMENTED     IN THE LITERATURE TO HAVE     A SENSITIVITY AND NEGATIVE     PREDICTIVE VALUE OF AT LEAST     98 TO 99%.  THE TEST RESULT     SHOULD BE CORRELATED WITH  AN ASSESSMENT OF THE CLINICAL     PROBABILITY OF DVT / VTE.   Platelets 17 (*) 150 - 400 K/uL   Comment: REPEATED TO VERIFY     CRITICAL VALUE NOTED.  VALUE IS CONSISTENT WITH PREVIOUSLY REPORTED AND CALLED VALUE.     SPECIMEN CHECKED FOR CLOTS   Smear Review NO SCHISTOCYTES SEEN    PREPARE PLATELET PHERESIS     Status: None   Collection Time    07/28/13  1:30 PM      Result Value Ref Range   Unit Number [id number]     Blood Component Type PLTPHER LR2     Unit division 00     Status of Unit ALLOCATED     Transfusion Status OK TO TRANSFUSE    CLOSTRIDIUM DIFFICILE BY PCR     Status: None   Collection Time    07/28/13  1:51 PM      Result Value Ref Range   C difficile by pcr NEGATIVE  NEGATIVE   Comment: Performed at [facility], ARTERIAL     Status: Abnormal   Collection Time    07/28/13  8:06 PM       Result Value Ref Range   O2 Content 3.0     Delivery systems NASAL CANNULA     pH, Arterial 7.380  7.350 - 7.450   pCO2 arterial 36.4  35.0 - 45.0 mmHg   pO2, Arterial 78.7 (*) 80.0 - 100.0 mmHg   Bicarbonate 21.0  20.0 - 24.0 mEq/L   TCO2 18.8  0 - 100 mmol/L   Acid-base deficit 3.0 (*) 0.0 - 2.0 mmol/L   O2 Saturation 95.9     Patient temperature 98.6     Collection site RIGHT RADIAL     Drawn by [id number]     Sample type ARTERIAL DRAW     Allens test (pass/fail) PASS  PASS  CBC     Status: Abnormal   Collection Time    07/29/13  6:02 AM      Result Value Ref Range   WBC 16.2 (*) 4.0 - 10.5 K/uL   RBC 3.88  3.87 - 5.11 MIL/uL   Hemoglobin 12.3  12.0 - 15.0 g/dL   HCT 36.4  36.0 - 46.0 %   MCV 93.8  78.0 - 100.0 fL   MCH 31.7  26.0 - 34.0 pg   MCHC 33.8  30.0 - 36.0 g/dL   RDW 15.7 (*) 11.5 - 15.5 %   Platelets 39 (*) 150 - 400 K/uL   Comment: DELTA CHECK NOTED     POST TRANSFUSION SPECIMEN  BASIC METABOLIC PANEL     Status: Abnormal   Collection Time    07/29/13  6:02 AM      Result Value Ref Range   Sodium 136 (*) 137 - 147 mEq/L   Potassium 3.0 (*) 3.7 - 5.3 mEq/L   Chloride 104  96 - 112 mEq/L   CO2 23  19 - 32 mEq/L   Glucose, Bld 89  70 - 99 mg/dL   BUN 23  6 - 23 mg/dL   Creatinine, Ser 0.82  0.50 - 1.10 mg/dL   Calcium 7.9 (*) 8.4 - 10.5 mg/dL   GFR calc non Af Amer 65 (*) >90 mL/min   GFR calc Af Amer 75 (*) >90 mL/min   Comment: (NOTE)     The eGFR has been calculated using the CKD EPI equation.     This calculation  has not been validated in all clinical situations.     eGFR's persistently <90 mL/min signify possible Chronic Kidney     Disease.   Anion gap 9  5 - 15      Component Value Date/Time   SDES URINE, RANDOM DIRECT ASPIRATE FROM KIDNEY 07/26/2013 2234   SPECREQUEST NONE 07/26/2013 2234   CULT  Value: PROTEUS MIRABILIS Performed at [facility] 07/26/2013 2234   REPTSTATUS 07/29/2013 FINAL 07/26/2013 2234   Dg Chest 1 View  07/28/2013    CLINICAL DATA:  Weakness.  Edema.  EXAM: CHEST - 1 VIEW  COMPARISON:  07/27/2013  FINDINGS: There is irregular interstitial thickening, which appears minimally increased from the previous day's study. Basilar coarse reticular and discoid opacity, likely atelectasis, has increased and there are small effusions which have increased from the prior exam. No focal lung consolidation. No other change.  IMPRESSION: Pulmonary edema with mild worsening since the previous day's study.   Electronically Signed   By: Lajean Manes M.D.   On: 07/28/2013 09:03   Ct Angio Chest Pe W/cm &/or Wo Cm  07/28/2013   CLINICAL DATA:  Shortness of breath.  Hypoxia.  Elevated D-dimer.  EXAM: CT ANGIOGRAPHY CHEST WITH CONTRAST  TECHNIQUE: Multidetector CT imaging of the chest was performed using the standard protocol during bolus administration of intravenous contrast. Multiplanar CT image reconstructions and MIPs were obtained to evaluate the vascular anatomy.  CONTRAST: 152m OMNIPAQUE IOHEXOL 350 MG/ML IV.  COMPARISON:  None.  FINDINGS: Contrast opacification of the pulmonary arteries is good. Respiratory motion blurred images throughout the chest. Overall, the study is of moderate to good diagnostic quality.  No filling defects within either main pulmonary artery or their branches in either lung to suggest pulmonary embolism. Heart enlarged with mild left ventricular enlargement and right atrial enlargement. Minimal fluid in the superior pericardial recess; no significant pericardial effusion. Prominent epicardial fat. Extensive 3 vessel coronary atherosclerosis. Moderate to severe atherosclerosis involving the thoracic and upper abdominal aorta without aneurysm or dissection.  Severe emphysematous changes throughout both lungs. Mild diffuse interstitial pulmonary edema as noted on the portable chest x-ray earlier same date. Moderate bilateral pleural effusions, right greater than left, and associated dense passive atelectasis in the  lower lobes. Central airways patent with calcified tracheobronchial cartilages.  No significant mediastinal, hilar, or axillary lymphadenopathy. Nodule in the thyroid isthmus measuring approximately 1.3 cm. Diffuse body wall edema.  Reflux of contrast into the intrahepatic IVC and right hepatic vein. 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 Calcified and noncalcified plaque at the origin of the SMA with possible stenosis. Visualized upper abdomen otherwise unremarkable for the early arterial phase of enhancement. Bone window images demonstrate degenerative changes in the lower thoracic spine and exaggeration of the usual thoracic kyphosis.  Review of the MIP images confirms the above findings.  IMPRESSION: 1. No evidence of pulmonary embolism. 2. COPD/emphysema. Mild CHF with mild diffuse interstitial pulmonary edema. 3. Bilateral pleural effusions, right greater than left, with associated passive atelectasis in the lower lobes. 4. Cardiomegaly with left ventricular enlargement and right atrial enlargement. Reflux of contrast into the intrahepatic IVC and right hepatic vein is consistent with right heart failure and/or tricuspid regurgitation. Three vessel coronary atherosclerosis. 5. Severe COPD/emphysema. 6. 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 7. 1.3 cm nodule in the thyroid isthmus. No further imaging followup is felt necessary. This follows ACR  consensus guidelines: Managing Incidental Thyroid Nodules Detected on Imaging: White Paper of the ACR Incidental Thyroid Findings Committee. J Am Coll Radiol 2015; 12:143-150.   Electronically Signed   By: Evangeline Dakin M.D.   On: 07/28/2013 17:23   Recent Results (from the past 240 hour(s))  MRSA PCR SCREENING     Status: None   Collection Time    07/25/13  6:41 PM      Result Value Ref Range Status   MRSA by PCR NEGATIVE  NEGATIVE Final   Comment:             The GeneXpert MRSA Assay (FDA     approved for NASAL specimens     only), is one component of a     comprehensive MRSA colonization     surveillance program. It is not     intended to diagnose MRSA     infection nor to guide or     monitor treatment for     MRSA infections.  URINE CULTURE     Status: None   Collection Time    07/26/13  4:00 PM      Result Value Ref Range Status   Specimen Description URINE, CLEAN CATCH   Final   Special Requests NONE   Final   Culture  Setup Time     Final   Value: 07/26/2013 19:19     Performed at [facility]     Final   Value: >=100,000 COLONIES/ML     Performed at [facility]   Culture     Final   Value: PROTEUS MIRABILIS     Performed at [facility]   Report Status 07/29/2013 FINAL   Final   Organism ID, Bacteria PROTEUS MIRABILIS   Final  URINE CULTURE     Status: None   Collection Time    07/26/13  4:00 PM      Result Value Ref Range Status   Specimen Description URINE, CLEAN CATCH   Final   Special Requests NONE   Final   Culture  Setup Time     Final   Value: 07/27/2013 04:47     Performed at [facility]     Final   Value: >=100,000 COLONIES/ML     Performed at [facility]   Culture     Final   Value: PROTEUS MIRABILIS     Performed at [facility]   Report Status 07/29/2013 FINAL   Final   Organism ID, Bacteria PROTEUS MIRABILIS   Final  CULTURE, BLOOD (ROUTINE X 2)     Status: None   Collection Time    07/26/13  5:52 PM      Result Value Ref Range Status   Specimen Description BLOOD LEFT HAND   Final   Special Requests BOTTLES DRAWN AEROBIC AND ANAEROBIC 10 CC   Final   Culture  Setup Time     Final   Value: 07/26/2013 22:01     Performed at [facility]   Culture     Final   Value: PROTEUS MIRABILIS     Note: SUSCEPTIBILITIES PERFORMED ON PREVIOUS CULTURE WITHIN THE LAST 5 DAYS.     GRAM POSITIVE COCCI IN PAIRS     Note: Gram  Stain Report Called to,Read Back By and Verified With: STEPHANIE D@10 :34AM ON 07/27/13 BY DANTS     Performed at [facility]   Report  Status PENDING   Incomplete  CULTURE, BLOOD (ROUTINE X 2)     Status: None   Collection Time    07/26/13  5:57 PM      Result Value Ref Range Status   Specimen Description BLOOD LEFT ARM   Final   Special Requests BOTTLES DRAWN AEROBIC AND ANAEROBIC 10 CC   Final   Culture  Setup Time     Final   Value: 07/26/2013 22:01     Performed at [facility]   Culture     Final   Value: PROTEUS MIRABILIS     GRAM POSITIVE COCCI IN PAIRS     Note: Gram Stain Report Called to,Read Back By and Verified With: STEPHANIE D@10 :34AM ON 07/27/13 BY DANTS     Performed at [facility]   Report Status PENDING   Incomplete   Organism ID, Bacteria PROTEUS MIRABILIS   Final  URINE CULTURE     Status: None   Collection Time    07/26/13 10:34 PM      Result Value Ref Range Status   Specimen Description URINE, RANDOM DIRECT ASPIRATE FROM KIDNEY   Final   Special Requests NONE   Final   Culture  Setup Time     Final   Value: 07/27/2013 04:48     Performed at [place]     Final   Value: >=100,000 COLONIES/ML     Performed at [facility]   Culture     Final   Value: PROTEUS MIRABILIS     Performed at [facility]   Report Status 07/29/2013 FINAL   Final   Organism ID, Bacteria PROTEUS MIRABILIS   Final  CLOSTRIDIUM DIFFICILE BY PCR     Status: None   Collection Time    07/28/13  1:51 PM      Result Value Ref Range Status   C difficile by pcr NEGATIVE  NEGATIVE Final   Comment: Performed at [facility]      07/29/2013, 4:39 PM     LOS: 4 days     **Disclaimer: This note may have been dictated with voice recognition software. Similar sounding words can inadvertently be transcribed and this note may contain transcription errors which may not have been corrected upon publication of  note.**

## 2013-07-29 NOTE — Progress Notes (Signed)
Subjective:    S:   80 y.o.f who presented to [facility] On 7/21 w/ cc: abdominal pain, diarrhea, poor oral intake. Dx eval c/w left hydronephrosis apparantly related to Left UPJ obstruction. Baseline Cr. 1.7.   Transferred to [facility] from [facility] for consideration of perc drainage. On 7/22, She was transferred to ICU increased resp distress on 7/22 in setting of abd pain.  Now with elevated d-dimer and hypoxia. Negative PE evaluation overnight. CXR: edema for diuresis.  STUDIES:  7/17 CT abdo >>> stable hepatic cysts, cholelithiasis without inflammation, new severe left hydronephrosis concerning for UPJ stenosis.  7/21 CT abdo >>> stable left sided UPJ obstruction with severe hydro, cholelithiasis, stable hepatic cysts.  SIGNIFICANT EVENTS:  7/17 - initial presentation to [facility], discharged home.  7/21 - presented again to [facility] ED for same symptoms (abd pain)  7/22 - transferred to [facility] for urological evaluation,- moved to ICU for respiratory distress- hypotensive in setting of sepsis, treated w/ crystalloid resuscitation & neo infusion. Percutaneous nephrostomy tube placed, cloudy and bloody drainage. Trop I elevated, cards consulted for tachycardia.  7/23 - pain resolved. Still hypotensive but weaning pressors.  7/24 - off pressors   Objective: Vital signs in last 24 hours: Temp:  [98.4 F (36.9 C)-100.6 F (38.1 C)] 98.6 F (37 C) (07/25 0600) Pulse Rate:  [29-125] 93 (07/25 0600) Resp:  [19-34] 25 (07/25 0600) BP: (102-157)/(58-98) 144/72 mmHg (07/25 0600) SpO2:  [93 %-100 %] 99 % (07/25 0600)A  Intake/Output from previous day: 07/24 0701 - 07/25 0700 In: 2359.6 [P.O.:120; I.V.:1569.6; Blood:300; IV Piggyback:350] Out: 2025 [Urine:3415] Intake/Output this shift:    Past Medical History  Diagnosis Date  . COPD (chronic obstructive pulmonary disease)   . Arthritis   . Stroke     TIA four years ago  . Depression   . Seizures     x 2 last year due to bleeding to the brain.  . Memory  difficulties   . Chronic diarrhea   . Hiatal hernia   . Cancer     Skin cancer    Physical Exam:  Lungs - Normal respiratory effort, chest expands symmetrically.  Abdomen - Soft, non-tender & non-distended.  Lab Results:  Recent Labs  07/27/13 0535 07/28/13 0345 07/29/13 0602  WBC 20.4* 14.7* 16.2*  HGB 11.2* 11.3* 12.3  HCT 33.1* 33.6* 36.4   BMET  Recent Labs  07/28/13 0345 07/29/13 0602  NA 136* 136*  K 3.4* 3.0*  CL 106 104  CO2 19 23  GLUCOSE 96 89  BUN 26* 23  CREATININE 0.93 0.82  CALCIUM 7.6* 7.9*   No results found for this basename: LABURIN,  in the last 72 hours Results for orders placed during the hospital encounter of 07/25/13  MRSA PCR SCREENING     Status: None   Collection Time    07/25/13  6:41 PM      Result Value Ref Range Status   MRSA by PCR NEGATIVE  NEGATIVE Final   Comment:            The GeneXpert MRSA Assay (FDA     approved for NASAL specimens     only), is one component of a     comprehensive MRSA colonization     surveillance program. It is not     intended to diagnose MRSA     infection nor to guide or     monitor treatment for     MRSA infections.  URINE CULTURE  Status: None   Collection Time    07/26/13  4:00 PM      Result Value Ref Range Status   Specimen Description URINE, CLEAN CATCH   Final   Special Requests NONE   Final   Culture  Setup Time     Final   Value: 07/26/2013 19:19     Performed at [facility]     Final   Value: >=100,000 COLONIES/ML     Performed at [facility]   Culture     Final   Value: PROTEUS MIRABILIS     Performed at [facility]   Report Status PENDING   Incomplete  URINE CULTURE     Status: None   Collection Time    07/26/13  4:00 PM      Result Value Ref Range Status   Specimen Description URINE, CLEAN CATCH   Final   Special Requests NONE   Final   Culture  Setup Time     Final   Value: 07/27/2013 04:47     Performed at [place] [facility]     Final   Value: >=100,000 COLONIES/ML     Performed at [facility]   Culture     Final   Value: PROTEUS MIRABILIS     Performed at [facility]   Report Status PENDING   Incomplete  CULTURE, BLOOD (ROUTINE X 2)     Status: None   Collection Time    07/26/13  5:52 PM      Result Value Ref Range Status   Specimen Description BLOOD LEFT HAND   Final   Special Requests BOTTLES DRAWN AEROBIC AND ANAEROBIC 10 CC   Final   Culture  Setup Time     Final   Value: 07/26/2013 22:01     Performed at [facility]   Culture     Final   Value: PROTEUS MIRABILIS     GRAM POSITIVE COCCI IN PAIRS     Note: Gram Stain Report Called to,Read Back By and Verified With: STEPHANIE D@10 :34AM ON 07/27/13 BY DANTS     Performed at [facility]   Report Status PENDING   Incomplete  CULTURE, BLOOD (ROUTINE X 2)     Status: None   Collection Time    07/26/13  5:57 PM      Result Value Ref Range Status   Specimen Description BLOOD LEFT ARM   Final   Special Requests BOTTLES DRAWN AEROBIC AND ANAEROBIC 10 CC   Final   Culture  Setup Time     Final   Value: 07/26/2013 22:01     Performed at [facility]   Culture     Final   Value: PROTEUS MIRABILIS     GRAM POSITIVE COCCI IN PAIRS     Note: Gram Stain Report Called to,Read Back By and Verified With: STEPHANIE D@10 :34AM ON 07/27/13 BY DANTS     Performed at [facility]   Report Status PENDING   Incomplete  URINE CULTURE     Status: None   Collection Time    07/26/13 10:34 PM      Result Value Ref Range Status   Specimen Description URINE, RANDOM DIRECT ASPIRATE FROM KIDNEY   Final   Special Requests NONE   Final   Culture  Setup Time     Final   Value: 07/27/2013 04:48  Performed at [facility]     Final   Value: >=100,000 COLONIES/ML     Performed at [facility]   Culture     Final   Value: PROTEUS MIRABILIS     Performed at [facility]   Report Status PENDING   Incomplete    Studies/Results: Dg Chest 1 View  07/28/2013   CLINICAL DATA:  Weakness.  Edema.  EXAM: CHEST - 1 VIEW  COMPARISON:  07/27/2013  FINDINGS: There is irregular interstitial thickening, which appears minimally increased from the previous day's study. Basilar coarse reticular and discoid opacity, likely atelectasis, has increased and there are small effusions which have increased from the prior exam. No focal lung consolidation. No other change.  IMPRESSION: Pulmonary edema with mild worsening since the previous day's study.   Electronically Signed   By: Lajean Manes M.D.   On: 07/28/2013 09:03   Ct Angio Chest Pe W/cm &/or Wo Cm  07/28/2013   CLINICAL DATA:  Shortness of breath.  Hypoxia.  Elevated D-dimer.  EXAM: CT ANGIOGRAPHY CHEST WITH CONTRAST  TECHNIQUE: Multidetector CT imaging of the chest was performed using the standard protocol during bolus administration of intravenous contrast. Multiplanar CT image reconstructions and MIPs were obtained to evaluate the vascular anatomy.  CONTRAST: 154mL OMNIPAQUE IOHEXOL 350 MG/ML IV.  COMPARISON:  None.  FINDINGS: Contrast opacification of the pulmonary arteries is good. Respiratory motion blurred images throughout the chest. Overall, the study is of moderate to good diagnostic quality.  No filling defects within either main pulmonary artery or their branches in either lung to suggest pulmonary embolism. Heart enlarged with mild left ventricular enlargement and right atrial enlargement. Minimal fluid in the superior pericardial recess; no significant pericardial effusion. Prominent epicardial fat. Extensive 3 vessel coronary atherosclerosis. Moderate to severe atherosclerosis involving the thoracic and upper abdominal aorta without aneurysm or dissection.  Severe emphysematous changes throughout both lungs. Mild diffuse interstitial pulmonary edema as noted on the portable chest x-ray earlier same date. Moderate  bilateral pleural effusions, right greater than left, and associated dense passive atelectasis in the lower lobes. Central airways patent with calcified tracheobronchial cartilages.  No significant mediastinal, hilar, or axillary lymphadenopathy. Nodule in the thyroid isthmus measuring approximately 1.3 cm. Diffuse body wall edema.  Reflux of contrast into the intrahepatic IVC and right hepatic vein. 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 Calcified and noncalcified plaque at the origin of the SMA with possible stenosis. Visualized upper abdomen otherwise unremarkable for the early arterial phase of enhancement. Bone window images demonstrate degenerative changes in the lower thoracic spine and exaggeration of the usual thoracic kyphosis.  Review of the MIP images confirms the above findings.  IMPRESSION: 1. No evidence of pulmonary embolism. 2. COPD/emphysema. Mild CHF with mild diffuse interstitial pulmonary edema. 3. Bilateral pleural effusions, right greater than left, with associated passive atelectasis in the lower lobes. 4. Cardiomegaly with left ventricular enlargement and right atrial enlargement. Reflux of contrast into the intrahepatic IVC and right hepatic vein is consistent with right heart failure and/or tricuspid regurgitation. Three vessel coronary atherosclerosis. 5. Severe COPD/emphysema. 6. 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 7. 1.3 cm nodule in the thyroid isthmus. No further imaging followup is felt necessary. This follows ACR consensus guidelines: Managing Incidental Thyroid Nodules Detected on Imaging: White Paper of the ACR Incidental Thyroid Findings Committee. J Am  Coll Radiol 2015; 12:143-150.   Electronically Signed   By: Evangeline Dakin M.D.   On: 07/28/2013 17:23    Assessment: L UPJ, with perc drainage. Urosepic shock, now with possible DIC.  Urine c/s: Proteus. Also gram+ cocci.   Sensitivities pending. Levaquin, Vancomycin, Azactam.  Plan: Urology to follow.  Kripa Foskey I 07/29/2013, 8:07 AM

## 2013-07-29 NOTE — Progress Notes (Signed)
[facility] PROGRESS NOTE  Bridget Ball IPJ:[id number] DOB: April 18, 1928 DOA: 07/25/2013 PCP: Delphina Cahill, MD  Assessment/Plan: 80 y/o female with PMH of advanced COPD, smoker, anxiety presented to [facility] On 7/21 w/ cc: abdominal pain, diarrhea, poor oral intake and diagnosed with left hydro.  -Transferred to [facility] from [facility] for consideration of perc drainage. On 7/22, She was transferred to ICU increased resp distress, hypotension  1. Sepsis/fever/UTI/septic shock off pressor; hemodynamically improved; possible DIC -febrile 7/24; will repeat blood cultures; cont IV atx, blood cultures (GNR, GPC), PROTEUS MIRABILIS; cont gentle IVF prn; consulted ID evaluation   2. Acute respiratory failure; CT chest no PE, COPD/emphysema, effusion,.  -prn diuresis; cont oxygen, inhalers as needed; stopped smoking  3. COPD/em[pysema; cont oxygen, inhalers;  4. Left UPJ obstruction with hydronephrosis -7/22 : s/p left percutaneous nephrostomy; cont per urology  5. Mild troponin elevation, suspect element of demand ischemia; no chest pain -echo: Systolic function was normal. The estimated ejection fraction was in the range of 73% to 41%; diastolic dysfunction  -clinically CHF, effusions; likely R sided HF, with TR; seen on CT; cont diuresis prn, replace lytes  6. Thrombocytopenia likely sepsis related vs consumptive procedure IRL; possible DIC    -no s/s of bleeding; recheck CBC in AM: hold heparin; HIT antbd pend; TF prn;  -TF sed platelets on 7/24; TF prn;  7. Tachycardia likely sepsis related; d/c dig; Cardizem as needed if BP allows    DVT prophylaxis; start SCD; hold heparin due to thrombocytopenia   Code Status: DNR Family Communication: patient, nurse;  updated family at the bedside; [name],[name] Daughter [phone]  (indicate person spoken with, relationship, and if by phone, the number) Disposition Plan: pend clinical improvement    Consultants:  Urology, critical care    Procedures: 7/22 : s/p left percutaneous nephrostomy  7/17 CT abdo >>> stable hepatic cysts, cholelithiasis without inflammation, new severe left hydronephrosis concerning for UPJ stenosis.  7/21 CT abdo >>> stable left sided UPJ obstruction with severe hydro, cholelithiasis, stable hepatic cysts.  SIGNIFICANT EVENTS:  7/17 - initial presentation to [facility], discharged home.  7/21 - presented again to [facility] for same symptoms (abd pain)  7/22 - transferred to [facility] for urological evaluation,- moved to ICU for respiratory distress- hypotensive in setting of sepsis, treated w/ crystalloid resuscitation & neo infusion. Percutaneous nephrostomy tube placed, cloudy and bloody drainage. Trop I elevated, cards consulted for tachycardia.  7/23: pain resolved. Still hypotensive but weaning pressors.    Antibiotics:  Aztreonam Vancomycin 7-22 <<< Levaquin 7-22 <<< Aztreonam 7/22<<<    (indicate start date, and stop date if known)  HPI/Subjective: alert  Objective: Filed Vitals:   07/29/13 0600  BP: 144/72  Pulse: 93  Temp: 98.6 F (37 C)  Resp: 25    Intake/Output Summary (Last 24 hours) at 07/29/13 0846 Last data filed at 07/29/13 0600  Gross per 24 hour  Intake 1459.59 ml  Output   3140 ml  Net -1680.41 ml   Filed Weights   07/25/13 1132 07/26/13 0500  Weight: 46.409 kg (102 lb 5 oz) 46.3 kg (102 lb 1.2 oz)    Exam:   General:  alert  Cardiovascular: s1,s2 rrr  Respiratory: diminished AE LL  Abdomen: soft, nt,nd   Musculoskeletal: no LE edema   Data Reviewed: Basic Metabolic Panel:  Recent Labs Lab 07/26/13 0505 07/26/13 1830 07/27/13 0535 07/28/13 0345 07/29/13 0602  NA 137 138 135* 136* 136*  K 3.1* 2.9* 3.5* 3.4* 3.0*  CL 100 102 101 106 104  CO2 26 22 18* 19 23  GLUCOSE 91 85 177* 96 89  BUN 31* 30* 27* 26* 23  CREATININE 1.36* 1.33* 1.21* 0.93 0.82  CALCIUM 7.8* 7.7* 7.8* 7.6* 7.9*  MG  --   --  2.4  --   --   PHOS  --   --  2.1*  --   --     Liver Function Tests:  Recent Labs Lab 07/25/13 1225 07/26/13 0505 07/27/13 0535  AST 79* 40* 62*  ALT 62* 48* 49*  ALKPHOS 63 76 174*  BILITOT 0.4 0.3 0.7  PROT 5.9* 5.3* 4.9*  ALBUMIN 2.6* 2.2* 1.8*    Recent Labs Lab 07/25/13 1225  LIPASE 12   No results found for this basename: AMMONIA,  in the last 168 hours CBC:  Recent Labs Lab 07/25/13 1225 07/26/13 0505 07/26/13 1830 07/27/13 0535 07/28/13 0345 07/28/13 0817 07/29/13 0602  WBC 13.9* 13.3* 3.1* 20.4* 14.7*  --  16.2*  NEUTROABS 13.3*  --   --   --   --   --   --   HGB 12.3 10.9* 10.8* 11.2* 11.3*  --  12.3  HCT 35.9* 32.6* 32.4* 33.1* 33.6*  --  36.4  MCV 94.2 95.0 95.6 94.8 94.9  --  93.8  PLT 88* 80* PLATELET CLUMPS NOTED ON SMEAR, COUNT APPEARS DECREASED 42* 21* 17* 39*   Cardiac Enzymes:  Recent Labs Lab 07/25/13 2333 07/26/13 0505 07/26/13 1752 07/26/13 1830 07/27/13 0114 07/27/13 0540  CKTOTAL  --   --   --  63  --   --   CKMB  --   --   --  3.0  --   --   TROPONINI <0.30 <0.30 0.75*  --  0.39* <0.30   BNP (last 3 results) No results found for this basename: PROBNP,  in the last 8760 hours CBG: No results found for this basename: GLUCAP,  in the last 168 hours  Recent Results (from the past 240 hour(s))  MRSA PCR SCREENING     Status: None   Collection Time    07/25/13  6:41 PM      Result Value Ref Range Status   MRSA by PCR NEGATIVE  NEGATIVE Final   Comment:            The GeneXpert MRSA Assay (FDA     approved for NASAL specimens     only), is one component of a     comprehensive MRSA colonization     surveillance program. It is not     intended to diagnose MRSA     infection nor to guide or     monitor treatment for     MRSA infections.  URINE CULTURE     Status: None   Collection Time    07/26/13  4:00 PM      Result Value Ref Range Status   Specimen Description URINE, CLEAN CATCH   Final   Special Requests NONE   Final   Culture  Setup Time     Final   Value:  07/26/2013 19:19     Performed at [facility]     Final   Value: >=100,000 COLONIES/ML     Performed at [facility]   Culture     Final   Value: PROTEUS MIRABILIS     Performed at [facility]   Report Status PENDING   Incomplete  URINE CULTURE     Status: None   Collection Time    07/26/13  4:00 PM      Result Value Ref Range Status   Specimen Description URINE, CLEAN CATCH   Final   Special Requests NONE   Final   Culture  Setup Time     Final   Value: 07/27/2013 04:47     Performed at [facility]     Final   Value: >=100,000 COLONIES/ML     Performed at [facility]   Culture     Final   Value: PROTEUS MIRABILIS     Performed at [facility]   Report Status PENDING   Incomplete  CULTURE, BLOOD (ROUTINE X 2)     Status: None   Collection Time    07/26/13  5:52 PM      Result Value Ref Range Status   Specimen Description BLOOD LEFT HAND   Final   Special Requests BOTTLES DRAWN AEROBIC AND ANAEROBIC 10 CC   Final   Culture  Setup Time     Final   Value: 07/26/2013 22:01     Performed at [facility]   Culture     Final   Value: PROTEUS MIRABILIS     Note: SUSCEPTIBILITIES PERFORMED ON PREVIOUS CULTURE WITHIN THE LAST 5 DAYS.     GRAM POSITIVE COCCI IN PAIRS     Note: Gram Stain Report Called to,Read Back By and Verified With: STEPHANIE D@10 :34AM ON 07/27/13 BY DANTS     Performed at [facility]   Report Status PENDING   Incomplete  CULTURE, BLOOD (ROUTINE X 2)     Status: None   Collection Time    07/26/13  5:57 PM      Result Value Ref Range Status   Specimen Description BLOOD LEFT ARM   Final   Special Requests BOTTLES DRAWN AEROBIC AND ANAEROBIC 10 CC   Final   Culture  Setup Time     Final   Value: 07/26/2013 22:01     Performed at [facility]   Culture     Final   Value: PROTEUS MIRABILIS     GRAM POSITIVE COCCI IN PAIRS     Note: Gram Stain Report Called  to,Read Back By and Verified With: STEPHANIE D@10 :34AM ON 07/27/13 BY DANTS     Performed at [facility]   Report Status 07/29/2013 FINAL   Final   Organism ID, Bacteria PROTEUS MIRABILIS   Final  URINE CULTURE     Status: None   Collection Time    07/26/13 10:34 PM      Result Value Ref Range Status   Specimen Description URINE, RANDOM DIRECT ASPIRATE FROM KIDNEY   Final   Special Requests NONE   Final   Culture  Setup Time     Final   Value: 07/27/2013 04:48     Performed at [facility]     Final   Value: >=100,000 COLONIES/ML     Performed at [facility]   Culture     Final   Value: PROTEUS MIRABILIS     Performed at [facility]   Report Status PENDING   Incomplete     Studies: Dg Chest 1 View  07/28/2013   CLINICAL DATA:  Weakness.  Edema.  EXAM: CHEST - 1 VIEW  COMPARISON:  07/27/2013  FINDINGS: There is irregular interstitial thickening,  which appears minimally increased from the previous day's study. Basilar coarse reticular and discoid opacity, likely atelectasis, has increased and there are small effusions which have increased from the prior exam. No focal lung consolidation. No other change.  IMPRESSION: Pulmonary edema with mild worsening since the previous day's study.   Electronically Signed   By: Lajean Manes M.D.   On: 07/28/2013 09:03   Ct Angio Chest Pe W/cm &/or Wo Cm  07/28/2013   CLINICAL DATA:  Shortness of breath.  Hypoxia.  Elevated D-dimer.  EXAM: CT ANGIOGRAPHY CHEST WITH CONTRAST  TECHNIQUE: Multidetector CT imaging of the chest was performed using the standard protocol during bolus administration of intravenous contrast. Multiplanar CT image reconstructions and MIPs were obtained to evaluate the vascular anatomy.  CONTRAST: 161mL OMNIPAQUE IOHEXOL 350 MG/ML IV.  COMPARISON:  None.  FINDINGS: Contrast opacification of the pulmonary arteries is good. Respiratory motion blurred images throughout the chest. Overall,  the study is of moderate to good diagnostic quality.  No filling defects within either main pulmonary artery or their branches in either lung to suggest pulmonary embolism. Heart enlarged with mild left ventricular enlargement and right atrial enlargement. Minimal fluid in the superior pericardial recess; no significant pericardial effusion. Prominent epicardial fat. Extensive 3 vessel coronary atherosclerosis. Moderate to severe atherosclerosis involving the thoracic and upper abdominal aorta without aneurysm or dissection.  Severe emphysematous changes throughout both lungs. Mild diffuse interstitial pulmonary edema as noted on the portable chest x-ray earlier same date. Moderate bilateral pleural effusions, right greater than left, and associated dense passive atelectasis in the lower lobes. Central airways patent with calcified tracheobronchial cartilages.  No significant mediastinal, hilar, or axillary lymphadenopathy. Nodule in the thyroid isthmus measuring approximately 1.3 cm. Diffuse body wall edema.  Reflux of contrast into the intrahepatic IVC and right hepatic vein. 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 Calcified and noncalcified plaque at the origin of the SMA with possible stenosis. Visualized upper abdomen otherwise unremarkable for the early arterial phase of enhancement. Bone window images demonstrate degenerative changes in the lower thoracic spine and exaggeration of the usual thoracic kyphosis.  Review of the MIP images confirms the above findings.  IMPRESSION: 1. No evidence of pulmonary embolism. 2. COPD/emphysema. Mild CHF with mild diffuse interstitial pulmonary edema. 3. Bilateral pleural effusions, right greater than left, with associated passive atelectasis in the lower lobes. 4. Cardiomegaly with left ventricular enlargement and right atrial enlargement. Reflux of contrast into the intrahepatic IVC and right hepatic  vein is consistent with right heart failure and/or tricuspid regurgitation. Three vessel coronary atherosclerosis. 5. Severe COPD/emphysema. 6. 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 7. 1.3 cm nodule in the thyroid isthmus. No further imaging followup is felt necessary. This follows ACR consensus guidelines: Managing Incidental Thyroid Nodules Detected on Imaging: White Paper of the ACR Incidental Thyroid Findings Committee. J Am Coll Radiol 2015; 12:143-150.   Electronically Signed   By: Evangeline Dakin M.D.   On: 07/28/2013 17:23    Scheduled Meds: . antiseptic oral rinse  15 mL Mouth Rinse BID  . arformoterol  15 mcg Nebulization BID  . atorvastatin  80 mg Oral q1800  . aztreonam  1 g Intravenous 3 times per day  . budesonide  0.5 mg Nebulization BID  . carvedilol  3.125 mg Oral BID WC  . citalopram  10 mg Oral Daily  . levofloxacin (LEVAQUIN) IV  750 mg Intravenous Q48H  . LORazepam  0.5 mg Intravenous TID  . nystatin  5 mL Oral QID  . pantoprazole  40 mg Oral QAC breakfast  . sodium chloride  3 mL Intravenous Q12H  . tiotropium  18 mcg Inhalation Daily  . vancomycin  500 mg Intravenous Q24H   Continuous Infusions: . sodium chloride 10 mL/hr at 07/28/13 1810    Principal Problem:   AKI (acute kidney injury) Active Problems:   Diarrhea   Abdominal pain, unspecified site   Hydronephrosis, left   Dehydration   Elevated troponin    Time spent: >35 minutes     Kinnie Feil  [facility] Pager [phone]. If 7PM-7AM, please contact night-coverage at [email or web address], password [facility] 07/29/2013, 8:46 AM  LOS: 4 days

## 2013-07-29 NOTE — Progress Notes (Signed)
Subjective:  Pt resting quietly,  More resp distress this am. Denies much of any pain with PCN drain  Objective: Vital signs in last 24 hours: Temp:  [98.4 F (36.9 C)-100.6 F (38.1 C)] 98.8 F (37.1 C) (07/25 0800) Pulse Rate:  [29-125] 98 (07/25 0902) Resp:  [19-34] 24 (07/25 0800) BP: (102-157)/(58-98) 141/83 mmHg (07/25 0902) SpO2:  [93 %-100 %] 99 % (07/25 0800) Last BM Date: 07/28/13  Intake/Output from previous day: 07/24 0701 - 07/25 0700 In: 2359.6 [P.O.:120; I.V.:1569.6; Blood:300; IV Piggyback:350] Out: 4332 [Urine:3415] Intake/Output this shift: Total I/O In: 20 [I.V.:20] Out: 251 [Urine:250; Stool:1]  Left PCN intact, no sig tenderness, output 360 cc's blood-tinged urine past 24hr, about 100cc in bag now  Lab Results:   Recent Labs  07/28/13 0345 07/28/13 0817 07/29/13 0602  WBC 14.7*  --  16.2*  HGB 11.3*  --  12.3  HCT 33.6*  --  36.4  PLT 21* 17* 39*   BMET  Recent Labs  07/28/13 0345 07/29/13 0602  NA 136* 136*  K 3.4* 3.0*  CL 106 104  CO2 19 23  GLUCOSE 96 89  BUN 26* 23  CREATININE 0.93 0.82  CALCIUM 7.6* 7.9*   PT/INR  Recent Labs  07/28/13 0817  LABPROT 13.2  INR 1.00   ABG  Recent Labs  07/26/13 1731 07/28/13 2006  PHART 7.275* 7.380  HCO3 22.5 21.0    Studies/Results: Dg Chest 1 View  07/28/2013   CLINICAL DATA:  Weakness.  Edema.  EXAM: CHEST - 1 VIEW  COMPARISON:  07/27/2013  FINDINGS: There is irregular interstitial thickening, which appears minimally increased from the previous day's study. Basilar coarse reticular and discoid opacity, likely atelectasis, has increased and there are small effusions which have increased from the prior exam. No focal lung consolidation. No other change.  IMPRESSION: Pulmonary edema with mild worsening since the previous day's study.   Electronically Signed   By: Lajean Manes M.D.   On: 07/28/2013 09:03   Ct Angio Chest Pe W/cm &/or Wo Cm  07/28/2013   CLINICAL DATA:   Shortness of breath.  Hypoxia.  Elevated D-dimer.  EXAM: CT ANGIOGRAPHY CHEST WITH CONTRAST  TECHNIQUE: Multidetector CT imaging of the chest was performed using the standard protocol during bolus administration of intravenous contrast. Multiplanar CT image reconstructions and MIPs were obtained to evaluate the vascular anatomy.  CONTRAST: 120mL OMNIPAQUE IOHEXOL 350 MG/ML IV.  COMPARISON:  None.  FINDINGS: Contrast opacification of the pulmonary arteries is good. Respiratory motion blurred images throughout the chest. Overall, the study is of moderate to good diagnostic quality.  No filling defects within either main pulmonary artery or their branches in either lung to suggest pulmonary embolism. Heart enlarged with mild left ventricular enlargement and right atrial enlargement. Minimal fluid in the superior pericardial recess; no significant pericardial effusion. Prominent epicardial fat. Extensive 3 vessel coronary atherosclerosis. Moderate to severe atherosclerosis involving the thoracic and upper abdominal aorta without aneurysm or dissection.  Severe emphysematous changes throughout both lungs. Mild diffuse interstitial pulmonary edema as noted on the portable chest x-ray earlier same date. Moderate bilateral pleural effusions, right greater than left, and associated dense passive atelectasis in the lower lobes. Central airways patent with calcified tracheobronchial cartilages.  No significant mediastinal, hilar, or axillary lymphadenopathy. Nodule in the thyroid isthmus measuring approximately 1.3 cm. Diffuse body wall edema.  Reflux of contrast into the intrahepatic IVC and right hepatic vein. 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 Calcified and noncalcified plaque at the origin of the SMA with possible stenosis. Visualized upper abdomen otherwise unremarkable for the early arterial phase of enhancement. Bone window images demonstrate  degenerative changes in the lower thoracic spine and exaggeration of the usual thoracic kyphosis.  Review of the MIP images confirms the above findings.  IMPRESSION: 1. No evidence of pulmonary embolism. 2. COPD/emphysema. Mild CHF with mild diffuse interstitial pulmonary edema. 3. Bilateral pleural effusions, right greater than left, with associated passive atelectasis in the lower lobes. 4. Cardiomegaly with left ventricular enlargement and right atrial enlargement. Reflux of contrast into the intrahepatic IVC and right hepatic vein is consistent with right heart failure and/or tricuspid regurgitation. Three vessel coronary atherosclerosis. 5. Severe COPD/emphysema. 6. 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 7. 1.3 cm nodule in the thyroid isthmus. No further imaging followup is felt necessary. This follows ACR consensus guidelines: Managing Incidental Thyroid Nodules Detected on Imaging: White Paper of the ACR Incidental Thyroid Findings Committee. J Am Coll Radiol 2015; 12:143-150.   Electronically Signed   By: Evangeline Dakin M.D.   On: 07/28/2013 17:23    Anti-infectives: Anti-infectives   Start     Dose/Rate Route Frequency Ordered Stop   07/29/13 1000  levofloxacin (LEVAQUIN) IVPB 750 mg     750 mg 100 mL/hr over 90 Minutes Intravenous Every 48 hours 07/29/13 0811     07/29/13 0800  levofloxacin (LEVAQUIN) IVPB 500 mg  Status:  Discontinued     500 mg 100 mL/hr over 60 Minutes Intravenous Every 48 hours 07/27/13 0748 07/29/13 0811   07/27/13 1800  vancomycin (VANCOCIN) 500 mg in sodium chloride 0.9 % 100 mL IVPB     500 mg 100 mL/hr over 60 Minutes Intravenous Every 24 hours 07/27/13 1417     07/27/13 0900  levofloxacin (LEVAQUIN) IVPB 750 mg     750 mg 100 mL/hr over 90 Minutes Intravenous  Once 07/27/13 0748 07/27/13 1055   07/27/13 0600  aztreonam (AZACTAM) 1 g in dextrose 5 % 50 mL IVPB     1 g 100 mL/hr over 30 Minutes Intravenous 3 times per day 07/26/13  1938     07/27/13 0200  metroNIDAZOLE (FLAGYL) IVPB 500 mg  Status:  Discontinued     500 mg 100 mL/hr over 60 Minutes Intravenous Every 8 hours 07/26/13 1822 07/26/13 1922   07/26/13 1945  aztreonam (AZACTAM) 2 g in dextrose 5 % 50 mL IVPB     2 g 100 mL/hr over 30 Minutes Intravenous NOW 07/26/13 1932 07/26/13 2148   07/26/13 1830  vancomycin (VANCOCIN) 500 mg in sodium chloride 0.9 % 100 mL IVPB  Status:  Discontinued     500 mg 100 mL/hr over 60 Minutes Intravenous Every 24 hours 07/26/13 1817 07/27/13 1038   07/26/13 1830  metroNIDAZOLE (FLAGYL) IVPB 500 mg  Status:  Discontinued     500 mg 100 mL/hr over 60 Minutes Intravenous  Once 07/26/13 1822 07/26/13 1922   07/26/13 1830  levofloxacin (LEVAQUIN) IVPB 750 mg  Status:  Discontinued     750 mg 100 mL/hr over 90 Minutes Intravenous Every 48 hours 07/26/13 1823 07/26/13 1930      Assessment/Plan: s/p left PCN 7/22; Gentle flushes as directed. other plans as per CCM/primary/Urology   LOS: 4 days    Ascencion Dike 07/29/2013

## 2013-07-29 NOTE — Progress Notes (Signed)
ANTIBIOTIC CONSULT NOTE   Pharmacy Consult for Vancomycin and Aztreonam Indication: rule out sepsis / bacteremia  Allergies  Allergen Reactions  . Penicillins Anaphylaxis  . Dilaudid [Hydromorphone Hcl]     Severe sedation per son    Patient Measurements: Height: 5\' 1"  (154.9 cm) Weight: 102 lb 1.2 oz (46.3 kg) IBW/kg (Calculated) : 47.8  Vital Signs: Temp: 98.6 F (37 C) (07/25 0600) Temp src: Core (Comment) (07/25 0400) BP: 144/72 mmHg (07/25 0600) Pulse Rate: 93 (07/25 0600) Intake/Output from previous day: 07/24 0701 - 07/25 0700 In: 2359.6 [P.O.:120; I.V.:1569.6; Blood:300; IV Piggyback:350] Out: 2671 [Urine:3415]  Labs:  Recent Labs  07/27/13 0535 07/28/13 0345 07/28/13 0817 07/29/13 0602  WBC 20.4* 14.7*  --  16.2*  HGB 11.2* 11.3*  --  12.3  PLT 42* 21* 17* 39*  CREATININE 1.21* 0.93  --  0.82   Estimated Creatinine Clearance: 38.7 ml/min (by C-G formula based on Cr of 0.82).  No results found for this basename: VANCOTROUGH, Corlis Leak, VANCORANDOM, [place], GENTPEAK, GENTRANDOM, TOBRATROUGH, TOBRAPEAK, TOBRARND, AMIKACINPEAK, AMIKACINTROU, AMIKACIN,  in the last 72 hours   Microbiology: Recent Results (from the past 720 hour(s))  CLOSTRIDIUM DIFFICILE BY PCR     Status: None   Collection Time    07/09/13  2:07 PM      Result Value Ref Range Status   C difficile by pcr NEGATIVE  NEGATIVE Final  MRSA PCR SCREENING     Status: None   Collection Time    07/25/13  6:41 PM      Result Value Ref Range Status   MRSA by PCR NEGATIVE  NEGATIVE Final   Comment:            The GeneXpert MRSA Assay (FDA     approved for NASAL specimens     only), is one component of a     comprehensive MRSA colonization     surveillance program. It is not     intended to diagnose MRSA     infection nor to guide or     monitor treatment for     MRSA infections.  URINE CULTURE     Status: None   Collection Time    07/26/13  4:00 PM      Result Value Ref Range Status    Specimen Description URINE, CLEAN CATCH   Final   Special Requests NONE   Final   Culture  Setup Time     Final   Value: 07/26/2013 19:19     Performed at [place]     Final   Value: >=100,000 COLONIES/ML     Performed at [facility]   Culture     Final   Value: PROTEUS MIRABILIS     Performed at [facility]   Report Status PENDING   Incomplete  URINE CULTURE     Status: None   Collection Time    07/26/13  4:00 PM      Result Value Ref Range Status   Specimen Description URINE, CLEAN CATCH   Final   Special Requests NONE   Final   Culture  Setup Time     Final   Value: 07/27/2013 04:47     Performed at [facility]     Final   Value: >=100,000 COLONIES/ML     Performed at [facility]   Culture     Final   Value: [place]  Performed at [facility]   Report Status PENDING   Incomplete  CULTURE, BLOOD (ROUTINE X 2)     Status: None   Collection Time    07/26/13  5:52 PM      Result Value Ref Range Status   Specimen Description BLOOD LEFT HAND   Final   Special Requests BOTTLES DRAWN AEROBIC AND ANAEROBIC 10 CC   Final   Culture  Setup Time     Final   Value: 07/26/2013 22:01     Performed at [facility]   Culture     Final   Value: PROTEUS MIRABILIS     GRAM POSITIVE COCCI IN PAIRS     Note: Gram Stain Report Called to,Read Back By and Verified With: STEPHANIE D@10 :34AM ON 07/27/13 BY DANTS     Performed at [facility]   Report Status PENDING   Incomplete  CULTURE, BLOOD (ROUTINE X 2)     Status: None   Collection Time    07/26/13  5:57 PM      Result Value Ref Range Status   Specimen Description BLOOD LEFT ARM   Final   Special Requests BOTTLES DRAWN AEROBIC AND ANAEROBIC 10 CC   Final   Culture  Setup Time     Final   Value: 07/26/2013 22:01     Performed at [facility]   Culture     Final   Value: PROTEUS MIRABILIS     GRAM POSITIVE COCCI  IN PAIRS     Note: Gram Stain Report Called to,Read Back By and Verified With: STEPHANIE D@10 :34AM ON 07/27/13 BY DANTS     Performed at [facility]   Report Status PENDING   Incomplete  URINE CULTURE     Status: None   Collection Time    07/26/13 10:34 PM      Result Value Ref Range Status   Specimen Description URINE, RANDOM DIRECT ASPIRATE FROM KIDNEY   Final   Special Requests NONE   Final   Culture  Setup Time     Final   Value: 07/27/2013 04:48     Performed at [place]     Final   Value: >=100,000 COLONIES/ML     Performed at [facility]   Culture     Final   Value: PROTEUS MIRABILIS     Performed at [facility]   Report Status PENDING   Incomplete    Medical History: Past Medical History  Diagnosis Date  . COPD (chronic obstructive pulmonary disease)   . Arthritis   . Stroke     TIA four years ago  . Depression   . Seizures     x 2 last year due to bleeding to the brain.  . Memory difficulties   . Chronic diarrhea   . Hiatal hernia   . Cancer     Skin cancer    Anti-infectives:  7/22 >> Vanc >> 7/23 7/23 >> Vanc >> 7/22 >> Aztreonam >>  7/23 >> LVQ >>    Assessment: 13 yoF presented to [facility] ED on 7/21 with severe abdominal pain, diarrhea, somnolence, poor PO intake and CT significant for severe hydronephrosis. History includes pancreatic stones scheduled to be removed at [facility] in ~2 wks, kidney stones, COPD, TIA, skin cancer, seizures. She was transferred to [facility] on 7/22 afternoon for urological evaluation; consider cystoscopy and ureteral stent placement on Friday. Rapid response was called for  severe and acute respiratory distress, abdominal distention, pain, tachycardia, fever. Pharmacy is consulted to dose Vancomycin and Aztreonam for r/o sepsis, likely related to pyelonephritis.   7/25:  Day #3 Levaquin, Day #4 Vancomycin and Aztreonam. Tmax: 100.6 WBCs: increased, 16.2 Renal: SCr improved to  0.83, CrCl ~ 38 ml/min (Baseline SCr ~ 0.7)  Cultures: 7/22 blood: Proteus, GPC in pairs 7/22 urine: > 100k Proteus   Goal of Therapy:  Vancomycin trough level 15-20 mcg/ml Appropriate abx dosing, eradication of infection.   Plan:   Continue Aztreonam 1g IV q8h  Increase to Levaquin 750mg  IV q48h  Continue Vancomycin 500mg  IV q24h.  Measure Vanc trough at steady state, 1700 today before next dose  Follow up renal fxn and culture results.   Gretta Arab PharmD, BCPS Pager [phone] 07/29/2013 8:14 AM

## 2013-07-29 NOTE — Progress Notes (Signed)
RN reports no problems during or after ceftriaxone 100mg  test dose. Will start ceftriaxone 1g IV q24h. Dose adjustments not likely to be needed. Pharmacy will sign-off.  Romeo Rabon, PharmD, pager [phone]. 07/29/2013,8:21 PM.

## 2013-07-30 ENCOUNTER — Inpatient Hospital Stay (HOSPITAL_COMMUNITY): Payer: Medicare Other

## 2013-07-30 DIAGNOSIS — A491 Streptococcal infection, unspecified site: Secondary | ICD-10-CM

## 2013-07-30 LAB — BASIC METABOLIC PANEL
Anion gap: 9 (ref 5–15)
BUN: 19 mg/dL (ref 6–23)
CALCIUM: 7.8 mg/dL — AB (ref 8.4–10.5)
CO2: 25 meq/L (ref 19–32)
Chloride: 103 mEq/L (ref 96–112)
Creatinine, Ser: 0.8 mg/dL (ref 0.50–1.10)
GFR calc Af Amer: 77 mL/min — ABNORMAL LOW (ref >90)
GFR, EST NON AFRICAN AMERICAN: 67 mL/min — AB (ref >90)
GLUCOSE: 92 mg/dL (ref 70–99)
Potassium: 3.4 mEq/L — ABNORMAL LOW (ref 3.7–5.3)
Sodium: 137 mEq/L (ref 137–147)

## 2013-07-30 LAB — CBC
HEMATOCRIT: 33.5 % — AB (ref 36.0–46.0)
HEMOGLOBIN: 11.6 g/dL — AB (ref 12.0–15.0)
MCH: 31.9 pg (ref 26.0–34.0)
MCHC: 34.6 g/dL (ref 30.0–36.0)
MCV: 92 fL (ref 78.0–100.0)
Platelets: 55 10*3/uL — ABNORMAL LOW (ref 150–400)
RBC: 3.64 MIL/uL — ABNORMAL LOW (ref 3.87–5.11)
RDW: 15.3 % (ref 11.5–15.5)
WBC: 15 10*3/uL — ABNORMAL HIGH (ref 4.0–10.5)

## 2013-07-30 MED ORDER — LORAZEPAM 0.5 MG PO TABS
0.5000 mg | ORAL_TABLET | Freq: Four times a day (QID) | ORAL | Status: DC | PRN
  Administered 2013-08-01 – 2013-08-02 (×2): 0.5 mg via ORAL
  Filled 2013-07-30 (×2): qty 1

## 2013-07-30 MED ORDER — FUROSEMIDE 20 MG PO TABS
20.0000 mg | ORAL_TABLET | ORAL | Status: DC
  Administered 2013-07-30 – 2013-08-03 (×3): 20 mg via ORAL
  Filled 2013-07-30 (×3): qty 1

## 2013-07-30 MED ORDER — KETOROLAC TROMETHAMINE 15 MG/ML IJ SOLN
7.5000 mg | Freq: Once | INTRAMUSCULAR | Status: AC
  Administered 2013-07-30: 7.5 mg via INTRAVENOUS
  Filled 2013-07-30: qty 1

## 2013-07-30 MED ORDER — LEVETIRACETAM 250 MG PO TABS
250.0000 mg | ORAL_TABLET | Freq: Two times a day (BID) | ORAL | Status: DC
  Administered 2013-07-30 – 2013-08-03 (×9): 250 mg via ORAL
  Filled 2013-07-30 (×10): qty 1

## 2013-07-30 MED ORDER — BUDESONIDE 3 MG PO CP24
3.0000 mg | ORAL_CAPSULE | Freq: Two times a day (BID) | ORAL | Status: DC
  Administered 2013-07-30 – 2013-08-03 (×9): 3 mg via ORAL
  Filled 2013-07-30 (×10): qty 1

## 2013-07-30 NOTE — Progress Notes (Signed)
[facility] PROGRESS NOTE  Bridget Ball AYT:[id number] DOB: [birth year] DOA: 07/25/2013 PCP: Delphina Cahill, MD  Assessment/Plan: 80 y/o female with PMH of advanced COPD, smoker, anxiety, microscopic colitis, h/o SAH/seizure presented to [facility] On 7/21 w/ cc: abdominal pain, diarrhea, poor oral intake and diagnosed with left hydro.  -Transferred to [facility] from [facility] for consideration of perc drainage. On 7/22, She was transferred to ICU increased resp distress, hypotension  1. Sepsis/fever/UTI/septic shock off pressor; hemodynamically improved; possible DIC -improved on IV atx, initial blood cultures (GNR, GPC); later final PROTEUS MIRABILIS; per ID evaluation atx changed to ceftriaxone; pend repeat blood cultures; appreciate ID input    2. Acute respiratory failure; CT chest no PE, COPD/emphysema, effusion,.  -improved on prn diuresis, oxygen, inhalers as needed; stopped smoking; desat study prior to d/c   3. COPD/em[pysema; cont oxygen, inhalers;  4. Left UPJ obstruction with hydronephrosis -7/22 : s/p left percutaneous nephrostomy; cont per urology  5. Mild troponin elevation, suspect element of demand ischemia; no chest pain -echo: Systolic function was normal. The estimated ejection fraction was in the range of 35% to 57%; diastolic dysfunction  -clinically CHF, effusions; likely R sided HF, with TR; seen on CT; cont diuresis prn, replace lytes  6. Thrombocytopenia likely sepsis related vs consumptive procedure IRL; possible DIC    -no s/s of bleeding; hold heparin; HIT antbd pend; TFused platelets on 7/24; TF prn;  7. Tachycardia likely sepsis related; d/c dig; Cardizem as needed if BP allows  8. H/o seizure with SAH; restarted keppra   TF to [facility] on 7/26  DVT prophylaxis; start SCD; hold heparin due to thrombocytopenia   Code Status: DNR Family Communication: patient, nurse;  updated family at the bedside; Gravlin,[name] Daughter [phone]  (indicate person spoken with,  relationship, and if by phone, the number) Disposition Plan: pend clinical improvement    Consultants:  Urology, critical care   Procedures: 7/22 : s/p left percutaneous nephrostomy  7/17 CT abdo >>> stable hepatic cysts, cholelithiasis without inflammation, new severe left hydronephrosis concerning for UPJ stenosis.  7/21 CT abdo >>> stable left sided UPJ obstruction with severe hydro, cholelithiasis, stable hepatic cysts.  SIGNIFICANT EVENTS:  7/17 - initial presentation to [facility], discharged home.  7/21 - presented again to [facility] for same symptoms (abd pain)  7/22 - transferred to [facility] for urological evaluation,- moved to ICU for respiratory distress- hypotensive in setting of sepsis, treated w/ crystalloid resuscitation & neo infusion. Percutaneous nephrostomy tube placed, cloudy and bloody drainage. Trop I elevated, cards consulted for tachycardia.  7/23: pain resolved. Still hypotensive but weaning pressors.    Antibiotics:  Aztreonam Vancomycin 7-22 <<<7/25 Levaquin 7-22 <<<7/25 Aztreonam 7/22<<<7/25 Ceftriaxone 7/25<<<<<    (indicate start date, and stop date if known)  HPI/Subjective: alert  Objective: Filed Vitals:   07/30/13 0600  BP: 104/57  Pulse: 78  Temp: 98.6 F (37 C)  Resp: 21    Intake/Output Summary (Last 24 hours) at 07/30/13 0825 Last data filed at 07/30/13 0600  Gross per 24 hour  Intake    783 ml  Output   2786 ml  Net  -2003 ml   Filed Weights   07/25/13 1132 07/26/13 0500 07/30/13 0400  Weight: 46.409 kg (102 lb 5 oz) 46.3 kg (102 lb 1.2 oz) 51.1 kg (112 lb 10.5 oz)    Exam:   General:  alert  Cardiovascular: s1,s2 rrr  Respiratory: diminished AE LL  Abdomen: soft, nt,nd   Musculoskeletal: no LE  edema   Data Reviewed: Basic Metabolic Panel:  Recent Labs Lab 07/26/13 1830 07/27/13 0535 07/28/13 0345 07/29/13 0602 07/30/13 0358  NA 138 135* 136* 136* 137  K 2.9* 3.5* 3.4* 3.0* 3.4*  CL 102 101 106 104 103  CO2 22 18*  19 23 25   GLUCOSE 85 177* 96 89 92  BUN 30* 27* 26* 23 19  CREATININE 1.33* 1.21* 0.93 0.82 0.80  CALCIUM 7.7* 7.8* 7.6* 7.9* 7.8*  MG  --  2.4  --   --   --   PHOS  --  2.1*  --   --   --    Liver Function Tests:  Recent Labs Lab 07/25/13 1225 07/26/13 0505 07/27/13 0535  AST 79* 40* 62*  ALT 62* 48* 49*  ALKPHOS 63 76 174*  BILITOT 0.4 0.3 0.7  PROT 5.9* 5.3* 4.9*  ALBUMIN 2.6* 2.2* 1.8*    Recent Labs Lab 07/25/13 1225  LIPASE 12   No results found for this basename: AMMONIA,  in the last 168 hours CBC:  Recent Labs Lab 07/25/13 1225  07/26/13 1830 07/27/13 0535 07/28/13 0345 07/28/13 0817 07/29/13 0602 07/30/13 0358  WBC 13.9*  < > 3.1* 20.4* 14.7*  --  16.2* 15.0*  NEUTROABS 13.3*  --   --   --   --   --   --   --   HGB 12.3  < > 10.8* 11.2* 11.3*  --  12.3 11.6*  HCT 35.9*  < > 32.4* 33.1* 33.6*  --  36.4 33.5*  MCV 94.2  < > 95.6 94.8 94.9  --  93.8 92.0  PLT 88*  < > PLATELET CLUMPS NOTED ON SMEAR, COUNT APPEARS DECREASED 42* 21* 17* 39* 55*  < > = values in this interval not displayed. Cardiac Enzymes:  Recent Labs Lab 07/25/13 2333 07/26/13 0505 07/26/13 1752 07/26/13 1830 07/27/13 0114 07/27/13 0540  CKTOTAL  --   --   --  63  --   --   CKMB  --   --   --  3.0  --   --   TROPONINI <0.30 <0.30 0.75*  --  0.39* <0.30   BNP (last 3 results) No results found for this basename: PROBNP,  in the last 8760 hours CBG: No results found for this basename: GLUCAP,  in the last 168 hours  Recent Results (from the past 240 hour(s))  MRSA PCR SCREENING     Status: None   Collection Time    07/25/13  6:41 PM      Result Value Ref Range Status   MRSA by PCR NEGATIVE  NEGATIVE Final   Comment:            The GeneXpert MRSA Assay (FDA     approved for NASAL specimens     only), is one component of a     comprehensive MRSA colonization     surveillance program. It is not     intended to diagnose MRSA     infection nor to guide or     monitor  treatment for     MRSA infections.  URINE CULTURE     Status: None   Collection Time    07/26/13  4:00 PM      Result Value Ref Range Status   Specimen Description URINE, CLEAN CATCH   Final   Special Requests NONE   Final   Culture  Setup Time     Final   Value:  07/26/2013 19:19     Performed at [facility]     Final   Value: >=100,000 COLONIES/ML     Performed at [facility]   Culture     Final   Value: PROTEUS MIRABILIS     Performed at [facility]   Report Status 07/29/2013 FINAL   Final   Organism ID, Bacteria PROTEUS MIRABILIS   Final  URINE CULTURE     Status: None   Collection Time    07/26/13  4:00 PM      Result Value Ref Range Status   Specimen Description URINE, CLEAN CATCH   Final   Special Requests NONE   Final   Culture  Setup Time     Final   Value: 07/27/2013 04:47     Performed at [place]     Final   Value: >=100,000 COLONIES/ML     Performed at [facility]   Culture     Final   Value: PROTEUS MIRABILIS     Performed at [facility]   Report Status 07/29/2013 FINAL   Final   Organism ID, Bacteria PROTEUS MIRABILIS   Final  CULTURE, BLOOD (ROUTINE X 2)     Status: None   Collection Time    07/26/13  5:52 PM      Result Value Ref Range Status   Specimen Description BLOOD LEFT HAND   Final   Special Requests BOTTLES DRAWN AEROBIC AND ANAEROBIC 10 CC   Final   Culture  Setup Time     Final   Value: 07/26/2013 22:01     Performed at [facility]   Culture     Final   Value: PROTEUS MIRABILIS     Note: SUSCEPTIBILITIES PERFORMED ON PREVIOUS CULTURE WITHIN THE LAST 5 DAYS.     GRAM POSITIVE COCCI IN PAIRS     Note: Gram Stain Report Called to,Read Back By and Verified With: STEPHANIE D@10 :34AM ON 07/27/13 BY DANTS     Performed at [facility]   Report Status PENDING   Incomplete  CULTURE, BLOOD (ROUTINE X 2)     Status: None   Collection Time     07/26/13  5:57 PM      Result Value Ref Range Status   Specimen Description BLOOD LEFT ARM   Final   Special Requests BOTTLES DRAWN AEROBIC AND ANAEROBIC 10 CC   Final   Culture  Setup Time     Final   Value: 07/26/2013 22:01     Performed at [facility]   Culture     Final   Value: PROTEUS MIRABILIS     [place] COCCI IN PAIRS     Note: Gram Stain Report Called to,Read Back By and Verified With: STEPHANIE D@10 :34AM ON 07/27/13 BY DANTS     Performed at [facility]   Report Status PENDING   Incomplete   Organism ID, Bacteria PROTEUS MIRABILIS   Final  URINE CULTURE     Status: None   Collection Time    07/26/13 10:34 PM      Result Value Ref Range Status   Specimen Description URINE, RANDOM DIRECT ASPIRATE FROM KIDNEY   Final   Special Requests NONE   Final   Culture  Setup Time     Final   Value: 07/27/2013 04:48     Performed at [facility]  Count     Final   Value: >=100,000 COLONIES/ML     Performed at [facility]   Culture     Final   Value: PROTEUS MIRABILIS     Performed at [facility]   Report Status 07/29/2013 FINAL   Final   Organism ID, Bacteria PROTEUS MIRABILIS   Final  CLOSTRIDIUM DIFFICILE BY PCR     Status: None   Collection Time    07/28/13  1:51 PM      Result Value Ref Range Status   C difficile by pcr NEGATIVE  NEGATIVE Final   Comment: Performed at [facility]     Studies: Dg Chest 1 View  07/28/2013   CLINICAL DATA:  Weakness.  Edema.  EXAM: CHEST - 1 VIEW  COMPARISON:  07/27/2013  FINDINGS: There is irregular interstitial thickening, which appears minimally increased from the previous day's study. Basilar coarse reticular and discoid opacity, likely atelectasis, has increased and there are small effusions which have increased from the prior exam. No focal lung consolidation. No other change.  IMPRESSION: Pulmonary edema with mild worsening since the previous day's study.   Electronically  Signed   By: Lajean Manes M.D.   On: 07/28/2013 09:03   Ct Angio Chest Pe W/cm &/or Wo Cm  07/28/2013   CLINICAL DATA:  Shortness of breath.  Hypoxia.  Elevated D-dimer.  EXAM: CT ANGIOGRAPHY CHEST WITH CONTRAST  TECHNIQUE: Multidetector CT imaging of the chest was performed using the standard protocol during bolus administration of intravenous contrast. Multiplanar CT image reconstructions and MIPs were obtained to evaluate the vascular anatomy.  CONTRAST: 110mL OMNIPAQUE IOHEXOL 350 MG/ML IV.  COMPARISON:  None.  FINDINGS: Contrast opacification of the pulmonary arteries is good. Respiratory motion blurred images throughout the chest. Overall, the study is of moderate to good diagnostic quality.  No filling defects within either main pulmonary artery or their branches in either lung to suggest pulmonary embolism. Heart enlarged with mild left ventricular enlargement and right atrial enlargement. Minimal fluid in the superior pericardial recess; no significant pericardial effusion. Prominent epicardial fat. Extensive 3 vessel coronary atherosclerosis. Moderate to severe atherosclerosis involving the thoracic and upper abdominal aorta without aneurysm or dissection.  Severe emphysematous changes throughout both lungs. Mild diffuse interstitial pulmonary edema as noted on the portable chest x-ray earlier same date. Moderate bilateral pleural effusions, right greater than left, and associated dense passive atelectasis in the lower lobes. Central airways patent with calcified tracheobronchial cartilages.  No significant mediastinal, hilar, or axillary lymphadenopathy. Nodule in the thyroid isthmus measuring approximately 1.3 cm. Diffuse body wall edema.  Reflux of contrast into the intrahepatic IVC and right hepatic vein. 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 Calcified and noncalcified plaque at the origin of the SMA with possible stenosis.  Visualized upper abdomen otherwise unremarkable for the early arterial phase of enhancement. Bone window images demonstrate degenerative changes in the lower thoracic spine and exaggeration of the usual thoracic kyphosis.  Review of the MIP images confirms the above findings.  IMPRESSION: 1. No evidence of pulmonary embolism. 2. COPD/emphysema. Mild CHF with mild diffuse interstitial pulmonary edema. 3. Bilateral pleural effusions, right greater than left, with associated passive atelectasis in the lower lobes. 4. Cardiomegaly with left ventricular enlargement and right atrial enlargement. Reflux of contrast into the intrahepatic IVC and right hepatic vein is consistent with right heart failure and/or tricuspid regurgitation. Three vessel coronary  atherosclerosis. 5. Severe COPD/emphysema. 6. 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 7. 1.3 cm nodule in the thyroid isthmus. No further imaging followup is felt necessary. This follows ACR consensus guidelines: Managing Incidental Thyroid Nodules Detected on Imaging: White Paper of the ACR Incidental Thyroid Findings Committee. J Am Coll Radiol 2015; 12:143-150.   Electronically Signed   By: Evangeline Dakin M.D.   On: 07/28/2013 17:23    Scheduled Meds: . antiseptic oral rinse  15 mL Mouth Rinse BID  . arformoterol  15 mcg Nebulization BID  . atorvastatin  80 mg Oral q1800  . budesonide  3 mg Oral BID  . budesonide (PULMICORT) nebulizer solution  0.5 mg Nebulization BID  . carvedilol  3.125 mg Oral BID WC  . cefTRIAXone (ROCEPHIN)  IV  1 g Intravenous Q24H  . citalopram  10 mg Oral Daily  . furosemide  20 mg Oral Q48H  . levETIRAcetam  250 mg Oral BID  . nystatin  5 mL Oral QID  . pantoprazole  40 mg Oral QAC breakfast  . potassium chloride  40 mEq Oral BID  . sodium chloride  3 mL Intravenous Q12H  . tiotropium  18 mcg Inhalation Daily   Continuous Infusions: . sodium chloride 10 mL/hr at 07/28/13 1810    Principal  Problem:   AKI (acute kidney injury) Active Problems:   Diarrhea   Abdominal pain, unspecified site   Hydronephrosis, left   Dehydration   Elevated troponin    Time spent: >35 minutes     Kinnie Feil  [facility] Pager [phone]. If 7PM-7AM, please contact night-coverage at [email or web address], password [facility] 07/30/2013, 8:25 AM  LOS: 5 days

## 2013-07-30 NOTE — Progress Notes (Signed)
INFECTIOUS DISEASE PROGRESS NOTE  ID: Bridget Ball is a 80 y.o. female with  Principal Problem:   AKI (acute kidney injury) Active Problems:   Diarrhea   Abdominal pain, unspecified site   Hydronephrosis, left   Dehydration   Elevated troponin  Subjective: Without complaints No change in BM, no sob.   Abtx:  Anti-infectives   Start     Dose/Rate Route Frequency Ordered Stop   07/29/13 2100  cefTRIAXone (ROCEPHIN) 1 g in dextrose 5 % 50 mL IVPB     1 g 100 mL/hr over 30 Minutes Intravenous Every 24 hours 07/29/13 2022     07/29/13 1800  cefTRIAXone (ROCEPHIN) 100 mg in dextrose 5 % 50 mL IVPB     100 mg 100 mL/hr over 30 Minutes Intravenous  Once 07/29/13 1729 07/29/13 1825   07/29/13 1000  levofloxacin (LEVAQUIN) IVPB 750 mg  Status:  Discontinued     750 mg 100 mL/hr over 90 Minutes Intravenous Every 48 hours 07/29/13 0811 07/29/13 1731   07/29/13 0800  levofloxacin (LEVAQUIN) IVPB 500 mg  Status:  Discontinued     500 mg 100 mL/hr over 60 Minutes Intravenous Every 48 hours 07/27/13 0748 07/29/13 0811   07/27/13 1800  vancomycin (VANCOCIN) 500 mg in sodium chloride 0.9 % 100 mL IVPB  Status:  Discontinued     500 mg 100 mL/hr over 60 Minutes Intravenous Every 24 hours 07/27/13 1417 07/29/13 1731   07/27/13 0900  levofloxacin (LEVAQUIN) IVPB 750 mg     750 mg 100 mL/hr over 90 Minutes Intravenous  Once 07/27/13 0748 07/27/13 1055   07/27/13 0600  aztreonam (AZACTAM) 1 g in dextrose 5 % 50 mL IVPB  Status:  Discontinued     1 g 100 mL/hr over 30 Minutes Intravenous 3 times per day 07/26/13 1938 07/29/13 1731   07/27/13 0200  metroNIDAZOLE (FLAGYL) IVPB 500 mg  Status:  Discontinued     500 mg 100 mL/hr over 60 Minutes Intravenous Every 8 hours 07/26/13 1822 07/26/13 1922   07/26/13 1945  aztreonam (AZACTAM) 2 g in dextrose 5 % 50 mL IVPB     2 g 100 mL/hr over 30 Minutes Intravenous NOW 07/26/13 1932 07/26/13 2148   07/26/13 1830  vancomycin (VANCOCIN) 500 mg in  sodium chloride 0.9 % 100 mL IVPB  Status:  Discontinued     500 mg 100 mL/hr over 60 Minutes Intravenous Every 24 hours 07/26/13 1817 07/27/13 1038   07/26/13 1830  metroNIDAZOLE (FLAGYL) IVPB 500 mg  Status:  Discontinued     500 mg 100 mL/hr over 60 Minutes Intravenous  Once 07/26/13 1822 07/26/13 1922   07/26/13 1830  levofloxacin (LEVAQUIN) IVPB 750 mg  Status:  Discontinued     750 mg 100 mL/hr over 90 Minutes Intravenous Every 48 hours 07/26/13 1823 07/26/13 1930      Medications:  Scheduled: . antiseptic oral rinse  15 mL Mouth Rinse BID  . arformoterol  15 mcg Nebulization BID  . atorvastatin  80 mg Oral q1800  . budesonide  3 mg Oral BID  . budesonide (PULMICORT) nebulizer solution  0.5 mg Nebulization BID  . carvedilol  3.125 mg Oral BID WC  . cefTRIAXone (ROCEPHIN)  IV  1 g Intravenous Q24H  . citalopram  10 mg Oral Daily  . furosemide  20 mg Oral Q48H  . levETIRAcetam  250 mg Oral BID  . nystatin  5 mL Oral QID  . pantoprazole  40 mg  Oral QAC breakfast  . potassium chloride  40 mEq Oral BID  . sodium chloride  3 mL Intravenous Q12H  . tiotropium  18 mcg Inhalation Daily    Objective: Vital signs in last 24 hours: Temp:  [97.6 F (36.4 C)-99.9 F (37.7 C)] 97.6 F (36.4 C) (07/26 1128) Pulse Rate:  [73-94] 91 (07/26 1128) Resp:  [19-27] 22 (07/26 1128) BP: (88-115)/(43-69) 115/69 mmHg (07/26 1128) SpO2:  [93 %-100 %] 93 % (07/26 1128) Weight:  [51.1 kg (112 lb 10.5 oz)] 51.1 kg (112 lb 10.5 oz) (07/26 0400)   General appearance: alert, cooperative and no distress Resp: clear to auscultation bilaterally Cardio: regular rate and rhythm GI: normal findings: bowel sounds normal, soft, non-tender and L sided drain in place  Lab Results  Recent Labs  07/29/13 0602 07/30/13 0358  WBC 16.2* 15.0*  HGB 12.3 11.6*  HCT 36.4 33.5*  NA 136* 137  K 3.0* 3.4*  CL 104 103  CO2 23 25  BUN 23 19  CREATININE 0.82 0.80   Liver Panel No results found for this  basename: PROT, ALBUMIN, AST, ALT, ALKPHOS, BILITOT, BILIDIR, IBILI,  in the last 72 hours Sedimentation Rate No results found for this basename: ESRSEDRATE,  in the last 72 hours C-Reactive Protein No results found for this basename: CRP,  in the last 72 hours  Microbiology: Recent Results (from the past 240 hour(s))  MRSA PCR SCREENING     Status: None   Collection Time    07/25/13  6:41 PM      Result Value Ref Range Status   MRSA by PCR NEGATIVE  NEGATIVE Final   Comment:            The GeneXpert MRSA Assay (FDA     approved for NASAL specimens     only), is one component of a     comprehensive MRSA colonization     surveillance program. It is not     intended to diagnose MRSA     infection nor to guide or     monitor treatment for     MRSA infections.  URINE CULTURE     Status: None   Collection Time    07/26/13  4:00 PM      Result Value Ref Range Status   Specimen Description URINE, CLEAN CATCH   Final   Special Requests NONE   Final   Culture  Setup Time     Final   Value: 07/26/2013 19:19     Performed at [facility]     Final   Value: >=100,000 COLONIES/ML     Performed at [facility]   Culture     Final   Value: PROTEUS MIRABILIS     Performed at [facility]   Report Status 07/29/2013 FINAL   Final   Organism ID, Bacteria PROTEUS MIRABILIS   Final  URINE CULTURE     Status: None   Collection Time    07/26/13  4:00 PM      Result Value Ref Range Status   Specimen Description URINE, CLEAN CATCH   Final   Special Requests NONE   Final   Culture  Setup Time     Final   Value: 07/27/2013 04:47     Performed at [place]     Final   Value: >=100,000 COLONIES/ML     Performed at [facility]  Culture     Final   Value: PROTEUS MIRABILIS     Performed at [facility]   Report Status 07/29/2013 FINAL   Final   Organism ID, Bacteria PROTEUS MIRABILIS   Final  CULTURE, BLOOD  (ROUTINE X 2)     Status: None   Collection Time    07/26/13  5:52 PM      Result Value Ref Range Status   Specimen Description BLOOD LEFT HAND   Final   Special Requests BOTTLES DRAWN AEROBIC AND ANAEROBIC 10 CC   Final   Culture  Setup Time     Final   Value: 07/26/2013 22:01     Performed at [facility]   Culture     Final   Value: PROTEUS MIRABILIS     Note: SUSCEPTIBILITIES PERFORMED ON PREVIOUS CULTURE WITHIN THE LAST 5 DAYS.     STREPTOCOCCUS SPECIES     Note: Gram Stain Report Called to,Read Back By and Verified With: STEPHANIE D@10 :34AM ON 07/27/13 BY DANTS     Performed at [facility]   Report Status PENDING   Incomplete  CULTURE, BLOOD (ROUTINE X 2)     Status: None   Collection Time    07/26/13  5:57 PM      Result Value Ref Range Status   Specimen Description BLOOD LEFT ARM   Final   Special Requests BOTTLES DRAWN AEROBIC AND ANAEROBIC 10 CC   Final   Culture  Setup Time     Final   Value: 07/26/2013 22:01     Performed at [facility]   Culture     Final   Value: PROTEUS MIRABILIS     GRAM POSITIVE COCCI IN PAIRS     STREPTOCOCCUS SPECIES     Note: Gram Stain Report Called to,Read Back By and Verified With: STEPHANIE D@10 :34AM ON 07/27/13 BY DANTS     Performed at [facility]   Report Status PENDING   Incomplete   Organism ID, Bacteria PROTEUS MIRABILIS   Final  URINE CULTURE     Status: None   Collection Time    07/26/13 10:34 PM      Result Value Ref Range Status   Specimen Description URINE, RANDOM DIRECT ASPIRATE FROM KIDNEY   Final   Special Requests NONE   Final   Culture  Setup Time     Final   Value: 07/27/2013 04:48     Performed at [place]     Final   Value: >=100,000 COLONIES/ML     Performed at [facility]   Culture     Final   Value: PROTEUS MIRABILIS     Performed at [facility]   Report Status 07/29/2013 FINAL   Final   Organism ID, Bacteria PROTEUS MIRABILIS    Final  CLOSTRIDIUM DIFFICILE BY PCR     Status: None   Collection Time    07/28/13  1:51 PM      Result Value Ref Range Status   C difficile by pcr NEGATIVE  NEGATIVE Final   Comment: Performed at [place], BLOOD (ROUTINE X 2)     Status: None   Collection Time    07/29/13  9:24 AM      Result Value Ref Range Status   Specimen Description BLOOD RIGHT HAND   Final   Special Requests BOTTLES DRAWN AEROBIC ONLY [place]   Final   Culture  Setup Time     Final   Value: 07/29/2013 14:07     Performed at [facility]   Culture     Final   Value: GRAM POSITIVE COCCI IN PAIRS     Note: Gram Stain Report Called to,Read Back By and Verified With: Sharyon Medicus 07/30/13 @ 1:26PM BY RUSCOE A.     Performed at [facility]   Report Status PENDING   Incomplete  CULTURE, BLOOD (ROUTINE X 2)     Status: None   Collection Time    07/29/13  9:39 AM      Result Value Ref Range Status   Specimen Description BLOOD LEFT HAND   Final   Special Requests BOTTLES DRAWN AEROBIC ONLY 2CC   Final   Culture  Setup Time     Final   Value: 07/29/2013 14:07     Performed at [facility]   Culture     Final   Value:        BLOOD CULTURE RECEIVED NO GROWTH TO DATE CULTURE WILL BE HELD FOR 5 DAYS BEFORE ISSUING A FINAL NEGATIVE REPORT     Performed at [facility]   Report Status PENDING   Incomplete    Studies/Results: Dg Shoulder Right  07/30/2013   CLINICAL DATA:  Pain for 1 week duration  EXAM: RIGHT SHOULDER - 2+ VIEW  COMPARISON:  None.  FINDINGS: Frontal and Y scapular images were obtained. There is mild narrowing of the glenohumeral joint. No fracture or dislocation. No erosive change or intra-articular calcification.  IMPRESSION: Mild osteoarthritic change in the glenohumeral joint. No fracture or dislocation.   Electronically Signed   By: Lowella Grip M.D.   On: 07/30/2013 09:36   Ct Angio Chest Pe W/cm &/or Wo Cm  07/28/2013   CLINICAL DATA:   Shortness of breath.  Hypoxia.  Elevated D-dimer.  EXAM: CT ANGIOGRAPHY CHEST WITH CONTRAST  TECHNIQUE: Multidetector CT imaging of the chest was performed using the standard protocol during bolus administration of intravenous contrast. Multiplanar CT image reconstructions and MIPs were obtained to evaluate the vascular anatomy.  CONTRAST: 123mL OMNIPAQUE IOHEXOL 350 MG/ML IV.  COMPARISON:  None.  FINDINGS: Contrast opacification of the pulmonary arteries is good. Respiratory motion blurred images throughout the chest. Overall, the study is of moderate to good diagnostic quality.  No filling defects within either main pulmonary artery or their branches in either lung to suggest pulmonary embolism. Heart enlarged with mild left ventricular enlargement and right atrial enlargement. Minimal fluid in the superior pericardial recess; no significant pericardial effusion. Prominent epicardial fat. Extensive 3 vessel coronary atherosclerosis. Moderate to severe atherosclerosis involving the thoracic and upper abdominal aorta without aneurysm or dissection.  Severe emphysematous changes throughout both lungs. Mild diffuse interstitial pulmonary edema as noted on the portable chest x-ray earlier same date. Moderate bilateral pleural effusions, right greater than left, and associated dense passive atelectasis in the lower lobes. Central airways patent with calcified tracheobronchial cartilages.  No significant mediastinal, hilar, or axillary lymphadenopathy. Nodule in the thyroid isthmus measuring approximately 1.3 cm. Diffuse body wall edema.  Reflux of contrast into the intrahepatic IVC and right hepatic vein. 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 Calcified and noncalcified plaque at the origin of the SMA with possible stenosis. Visualized upper abdomen otherwise unremarkable for the early arterial phase of enhancement. Bone window images demonstrate  degenerative changes in the lower thoracic  spine and exaggeration of the usual thoracic kyphosis.  Review of the MIP images confirms the above findings.  IMPRESSION: 1. No evidence of pulmonary embolism. 2. COPD/emphysema. Mild CHF with mild diffuse interstitial pulmonary edema. 3. Bilateral pleural effusions, right greater than left, with associated passive atelectasis in the lower lobes. 4. Cardiomegaly with left ventricular enlargement and right atrial enlargement. Reflux of contrast into the intrahepatic IVC and right hepatic vein is consistent with right heart failure and/or tricuspid regurgitation. Three vessel coronary atherosclerosis. 5. Severe COPD/emphysema. 6. 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 7. 1.3 cm nodule in the thyroid isthmus. No further imaging followup is felt necessary. This follows ACR consensus guidelines: Managing Incidental Thyroid Nodules Detected on Imaging: White Paper of the ACR Incidental Thyroid Findings Committee. J Am Coll Radiol 2015; 12:143-150.   Electronically Signed   By: Evangeline Dakin M.D.   On: 07/28/2013 17:23     Assessment/Plan: Urosepsis  P mirabilis (I unsayn, cipro; R-bactrim) in BCx and UCx  Streptococcal bacteremia Hydronephrosis with percutaneous drain 7-22  Fluid overload (on CXR)  Chronic pancreatitis with stone  Chronic Diarrhea/Microscopic colits  PEN allergy  ARF  DNR/DNI  Total days of antibiotics: 4 ceftriaxone  Her BCx from  7-25 is growing Strep, as did her Cx from 7-22.  This should be sensitive to PEN, Ceftriaxone which she is on.  More info from lab tomorrow.       Will repeat her BCx for clearance WBC slightly better, afebrile.  Bobby Rumpf Infectious Diseases (pager) [phone] [email or web address] 07/30/2013, 3:30 PM  LOS: 5 days   **Disclaimer: This note may have been dictated with voice recognition software. Similar sounding words can inadvertently be transcribed and this note may  contain transcription errors which may not have been corrected upon publication of note.**

## 2013-07-30 NOTE — Progress Notes (Signed)
CRITICAL VALUE ALERT  Critical value received:  Gram positive Cocci in chains  Date of notification: 07/30/13  Time of notification:  1947  Critical value read back:Yes.    Nurse who received alert:  J.Daley Mooradian  MD notified (1st page):  P.Joseph  Time of first page:  1948  MD notified (2nd page):  Time of second page:  Responding MD:  P.Jospeh  Time MD responded:  1949

## 2013-07-30 NOTE — Progress Notes (Signed)
CRITICAL VALUE ALERT  Critical value received:  Positive blood culture aerobic bottle- gram positive cocci in pairs  Date of notification:  07/30/2013   Time of notification:  4142  Critical value read back:Yes.    Nurse who received alert:  Sharyon Medicus RN  MD notified (1st page):  Daleen Bo, MD  Time of first page:  1329  MD notified (2nd page):  Time of second page:  Responding MD:  [id number]  Time MD responded:  Daleen Bo MD

## 2013-07-30 NOTE — Progress Notes (Signed)
Subjective: Proteus Urosepsis, UPJ obstruction, Percutaneous nephrostomy drainage continues.  Note continued low platelet count, but improved. .   Objective: Vital signs in last 24 hours: Temp:  [98.6 F (37 C)-99.9 F (37.7 C)] 98.8 F (37.1 C) (07/26 0900) Pulse Rate:  [73-94] 80 (07/26 0900) Resp:  [19-27] 20 (07/26 0900) BP: (83-115)/(46-61) 97/61 mmHg (07/26 0852) SpO2:  [94 %-100 %] 97 % (07/26 0900) Weight:  [51.1 kg (112 lb 10.5 oz)] 51.1 kg (112 lb 10.5 oz) (07/26 0400)  Intake/Output from previous day: 07/25 0701 - 07/26 0700 In: 803 [P.O.:240; I.V.:243; IV Piggyback:300] Out: 3036 [Urine:3035; Stool:1] Intake/Output this shift: Total I/O In: 30 [I.V.:20; Other:10] Out: -   Physical Exam:  General:alert and cooperative GI: not done and soft, non tender, normal bowel sounds, no palpable masses, no organomegaly, no inguinal hernia  Resp: clear to auscultation bilaterally. Some labored breathing noted.   Lab Results:  Recent Labs  07/28/13 0345 07/29/13 0602 07/30/13 0358  HGB 11.3* 12.3 11.6*  HCT 33.6* 36.4 33.5*   BMET  Recent Labs  07/29/13 0602 07/30/13 0358  NA 136* 137  K 3.0* 3.4*  CL 104 103  CO2 23 25  GLUCOSE 89 92  BUN 23 19  CREATININE 0.82 0.80  CALCIUM 7.9* 7.8*    Recent Labs  07/28/13 0817  INR 1.00   No results found for this basename: LABURIN,  in the last 72 hours Results for orders placed during the hospital encounter of 07/25/13  MRSA PCR SCREENING     Status: None   Collection Time    07/25/13  6:41 PM      Result Value Ref Range Status   MRSA by PCR NEGATIVE  NEGATIVE Final   Comment:            The GeneXpert MRSA Assay (FDA     approved for NASAL specimens     only), is one component of a     comprehensive MRSA colonization     surveillance program. It is not     intended to diagnose MRSA     infection nor to guide or     monitor treatment for     MRSA infections.  URINE CULTURE     Status: None   Collection Time    07/26/13  4:00 PM      Result Value Ref Range Status   Specimen Description URINE, CLEAN CATCH   Final   Special Requests NONE   Final   Culture  Setup Time     Final   Value: 07/26/2013 19:19     Performed at [place]     Final   Value: >=100,000 COLONIES/ML     Performed at [facility]   Culture     Final   Value: PROTEUS MIRABILIS     Performed at [facility]   Report Status 07/29/2013 FINAL   Final   Organism ID, Bacteria PROTEUS MIRABILIS   Final  URINE CULTURE     Status: None   Collection Time    07/26/13  4:00 PM      Result Value Ref Range Status   Specimen Description URINE, CLEAN CATCH   Final   Special Requests NONE   Final   Culture  Setup Time     Final   Value: 07/27/2013 04:47     Performed at [place]     Final  Value: >=100,000 COLONIES/ML     Performed at [facility]   Culture     Final   Value: PROTEUS MIRABILIS     Performed at [facility]   Report Status 07/29/2013 FINAL   Final   Organism ID, Bacteria PROTEUS MIRABILIS   Final  CULTURE, BLOOD (ROUTINE X 2)     Status: None   Collection Time    07/26/13  5:52 PM      Result Value Ref Range Status   Specimen Description BLOOD LEFT HAND   Final   Special Requests BOTTLES DRAWN AEROBIC AND ANAEROBIC 10 CC   Final   Culture  Setup Time     Final   Value: 07/26/2013 22:01     Performed at [facility]   Culture     Final   Value: PROTEUS MIRABILIS     Note: SUSCEPTIBILITIES PERFORMED ON PREVIOUS CULTURE WITHIN THE LAST 5 DAYS.     GRAM POSITIVE COCCI IN PAIRS     Note: Gram Stain Report Called to,Read Back By and Verified With: STEPHANIE D@10 :34AM ON 07/27/13 BY DANTS     Performed at [facility]   Report Status PENDING   Incomplete  CULTURE, BLOOD (ROUTINE X 2)     Status: None   Collection Time    07/26/13  5:57 PM      Result Value Ref Range Status   Specimen Description  BLOOD LEFT ARM   Final   Special Requests BOTTLES DRAWN AEROBIC AND ANAEROBIC 10 CC   Final   Culture  Setup Time     Final   Value: 07/26/2013 22:01     Performed at [facility]   Culture     Final   Value: PROTEUS MIRABILIS     GRAM POSITIVE COCCI IN PAIRS     Note: Gram Stain Report Called to,Read Back By and Verified With: STEPHANIE D@10 :34AM ON 07/27/13 BY DANTS     Performed at [facility]   Report Status PENDING   Incomplete   Organism ID, Bacteria PROTEUS MIRABILIS   Final  URINE CULTURE     Status: None   Collection Time    07/26/13 10:34 PM      Result Value Ref Range Status   Specimen Description URINE, RANDOM DIRECT ASPIRATE FROM KIDNEY   Final   Special Requests NONE   Final   Culture  Setup Time     Final   Value: 07/27/2013 04:48     Performed at [place]     Final   Value: >=100,000 COLONIES/ML     Performed at [facility]   Culture     Final   Value: PROTEUS MIRABILIS     Performed at [facility]   Report Status 07/29/2013 FINAL   Final   Organism ID, Bacteria PROTEUS MIRABILIS   Final  CLOSTRIDIUM DIFFICILE BY PCR     Status: None   Collection Time    07/28/13  1:51 PM      Result Value Ref Range Status   C difficile by pcr NEGATIVE  NEGATIVE Final   Comment: Performed at [facility]    Studies/Results: Dg Shoulder Right  07/30/2013   CLINICAL DATA:  Pain for 1 week duration  EXAM: RIGHT SHOULDER - 2+ VIEW  COMPARISON:  None.  FINDINGS: Frontal and Y scapular images were obtained. There is mild narrowing of the glenohumeral joint. No  fracture or dislocation. No erosive change or intra-articular calcification.  IMPRESSION: Mild osteoarthritic change in the glenohumeral joint. No fracture or dislocation.   Electronically Signed   By: Lowella Grip M.D.   On: 07/30/2013 09:36   Ct Angio Chest Pe W/cm &/or Wo Cm  07/28/2013   CLINICAL DATA:  Shortness of breath.  Hypoxia.  Elevated  D-dimer.  EXAM: CT ANGIOGRAPHY CHEST WITH CONTRAST  TECHNIQUE: Multidetector CT imaging of the chest was performed using the standard protocol during bolus administration of intravenous contrast. Multiplanar CT image reconstructions and MIPs were obtained to evaluate the vascular anatomy.  CONTRAST: 138mL OMNIPAQUE IOHEXOL 350 MG/ML IV.  COMPARISON:  None.  FINDINGS: Contrast opacification of the pulmonary arteries is good. Respiratory motion blurred images throughout the chest. Overall, the study is of moderate to good diagnostic quality.  No filling defects within either main pulmonary artery or their branches in either lung to suggest pulmonary embolism. Heart enlarged with mild left ventricular enlargement and right atrial enlargement. Minimal fluid in the superior pericardial recess; no significant pericardial effusion. Prominent epicardial fat. Extensive 3 vessel coronary atherosclerosis. Moderate to severe atherosclerosis involving the thoracic and upper abdominal aorta without aneurysm or dissection.  Severe emphysematous changes throughout both lungs. Mild diffuse interstitial pulmonary edema as noted on the portable chest x-ray earlier same date. Moderate bilateral pleural effusions, right greater than left, and associated dense passive atelectasis in the lower lobes. Central airways patent with calcified tracheobronchial cartilages.  No significant mediastinal, hilar, or axillary lymphadenopathy. Nodule in the thyroid isthmus measuring approximately 1.3 cm. Diffuse body wall edema.  Reflux of contrast into the intrahepatic IVC and right hepatic vein. 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 Calcified and noncalcified plaque at the origin of the SMA with possible stenosis. Visualized upper abdomen otherwise unremarkable for the early arterial phase of enhancement. Bone window images demonstrate degenerative changes in the lower thoracic  spine and exaggeration of the usual thoracic kyphosis.  Review of the MIP images confirms the above findings.  IMPRESSION: 1. No evidence of pulmonary embolism. 2. COPD/emphysema. Mild CHF with mild diffuse interstitial pulmonary edema. 3. Bilateral pleural effusions, right greater than left, with associated passive atelectasis in the lower lobes. 4. Cardiomegaly with left ventricular enlargement and right atrial enlargement. Reflux of contrast into the intrahepatic IVC and right hepatic vein is consistent with right heart failure and/or tricuspid regurgitation. Three vessel coronary atherosclerosis. 5. Severe COPD/emphysema. 6. 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 7. 1.3 cm nodule in the thyroid isthmus. No further imaging followup is felt necessary. This follows ACR consensus guidelines: Managing Incidental Thyroid Nodules Detected on Imaging: White Paper of the ACR Incidental Thyroid Findings Committee. J Am Coll Radiol 2015; 12:143-150.   Electronically Signed   By: Evangeline Dakin M.D.   On: 07/28/2013 17:23    Assessment/Plan Pt still in critical condition. Appreciate excellent note from Dr. Daleen Bo.  Family understands that pt is in critical condition.    LOS: 5 days   Bridget Ball I 07/30/2013, 10:20 AM

## 2013-07-30 NOTE — Progress Notes (Signed)
[place] [id number] TO MOVE TO 61- REPORT WAS CALLED TO AMANDA RN.

## 2013-07-31 DIAGNOSIS — B964 Proteus (mirabilis) (morganii) as the cause of diseases classified elsewhere: Secondary | ICD-10-CM

## 2013-07-31 LAB — HEPARIN INDUCED THROMBOCYTOPENIA PNL
Heparin Induced Plt Ab: NEGATIVE
Patient O.D.: 0.028
UFH HIGH DOSE UFH H: 0 %
UFH LOW DOSE 0.1 IU/ML: 0 %
UFH Low Dose 0.5 IU/mL: 2 % Release
UFH SRA Result: NEGATIVE

## 2013-07-31 LAB — PREPARE PLATELET PHERESIS: Unit division: 0

## 2013-07-31 LAB — URIC ACID: URIC ACID, SERUM: 2.9 mg/dL (ref 2.4–7.0)

## 2013-07-31 MED ORDER — LIDOCAINE 5 % EX PTCH
1.0000 | MEDICATED_PATCH | CUTANEOUS | Status: DC
  Administered 2013-07-31 – 2013-08-03 (×3): 1 via TRANSDERMAL
  Filled 2013-07-31 (×4): qty 1

## 2013-07-31 MED ORDER — MORPHINE SULFATE 2 MG/ML IJ SOLN
2.0000 mg | Freq: Three times a day (TID) | INTRAMUSCULAR | Status: DC | PRN
  Administered 2013-07-31 – 2013-08-02 (×2): 2 mg via INTRAVENOUS
  Filled 2013-07-31 (×2): qty 1

## 2013-07-31 MED ORDER — KETOROLAC TROMETHAMINE 15 MG/ML IJ SOLN
7.5000 mg | Freq: Two times a day (BID) | INTRAMUSCULAR | Status: DC | PRN
  Administered 2013-07-31 – 2013-08-02 (×3): 7.5 mg via INTRAVENOUS
  Filled 2013-07-31 (×3): qty 1

## 2013-07-31 MED ORDER — METOPROLOL TARTRATE 12.5 MG HALF TABLET
12.5000 mg | ORAL_TABLET | Freq: Two times a day (BID) | ORAL | Status: DC
  Administered 2013-07-31 – 2013-08-02 (×6): 12.5 mg via ORAL
  Filled 2013-07-31 (×8): qty 1

## 2013-07-31 NOTE — Progress Notes (Signed)
[place], RN, BSN, CCM  [phone]  Chart Reviewed for discharge and hospital needs.  Discharge needs at time of review: None present will follow for needs.  Review of patient progress due on [id number]

## 2013-07-31 NOTE — Progress Notes (Signed)
INFECTIOUS DISEASE PROGRESS NOTE  ID: Bridget Ball is a 80 y.o. female with  Principal Problem:   AKI (acute kidney injury) Active Problems:   Diarrhea   Abdominal pain, unspecified site   Hydronephrosis, left   Dehydration   Elevated troponin  Subjective: Without complaints  Abtx:  Anti-infectives   Start     Dose/Rate Route Frequency Ordered Stop   07/29/13 2100  cefTRIAXone (ROCEPHIN) 1 g in dextrose 5 % 50 mL IVPB     1 g 100 mL/hr over 30 Minutes Intravenous Every 24 hours 07/29/13 2022     07/29/13 1800  cefTRIAXone (ROCEPHIN) 100 mg in dextrose 5 % 50 mL IVPB     100 mg 100 mL/hr over 30 Minutes Intravenous  Once 07/29/13 1729 07/29/13 1825   07/29/13 1000  levofloxacin (LEVAQUIN) IVPB 750 mg  Status:  Discontinued     750 mg 100 mL/hr over 90 Minutes Intravenous Every 48 hours 07/29/13 0811 07/29/13 1731   07/29/13 0800  levofloxacin (LEVAQUIN) IVPB 500 mg  Status:  Discontinued     500 mg 100 mL/hr over 60 Minutes Intravenous Every 48 hours 07/27/13 0748 07/29/13 0811   07/27/13 1800  vancomycin (VANCOCIN) 500 mg in sodium chloride 0.9 % 100 mL IVPB  Status:  Discontinued     500 mg 100 mL/hr over 60 Minutes Intravenous Every 24 hours 07/27/13 1417 07/29/13 1731   07/27/13 0900  levofloxacin (LEVAQUIN) IVPB 750 mg     750 mg 100 mL/hr over 90 Minutes Intravenous  Once 07/27/13 0748 07/27/13 1055   07/27/13 0600  aztreonam (AZACTAM) 1 g in dextrose 5 % 50 mL IVPB  Status:  Discontinued     1 g 100 mL/hr over 30 Minutes Intravenous 3 times per day 07/26/13 1938 07/29/13 1731   07/27/13 0200  metroNIDAZOLE (FLAGYL) IVPB 500 mg  Status:  Discontinued     500 mg 100 mL/hr over 60 Minutes Intravenous Every 8 hours 07/26/13 1822 07/26/13 1922   07/26/13 1945  aztreonam (AZACTAM) 2 g in dextrose 5 % 50 mL IVPB     2 g 100 mL/hr over 30 Minutes Intravenous NOW 07/26/13 1932 07/26/13 2148   07/26/13 1830  vancomycin (VANCOCIN) 500 mg in sodium chloride 0.9 % 100 mL  IVPB  Status:  Discontinued     500 mg 100 mL/hr over 60 Minutes Intravenous Every 24 hours 07/26/13 1817 07/27/13 1038   07/26/13 1830  metroNIDAZOLE (FLAGYL) IVPB 500 mg  Status:  Discontinued     500 mg 100 mL/hr over 60 Minutes Intravenous  Once 07/26/13 1822 07/26/13 1922   07/26/13 1830  levofloxacin (LEVAQUIN) IVPB 750 mg  Status:  Discontinued     750 mg 100 mL/hr over 90 Minutes Intravenous Every 48 hours 07/26/13 1823 07/26/13 1930      Medications:  Scheduled: . antiseptic oral rinse  15 mL Mouth Rinse BID  . arformoterol  15 mcg Nebulization BID  . atorvastatin  80 mg Oral q1800  . budesonide  3 mg Oral BID  . budesonide (PULMICORT) nebulizer solution  0.5 mg Nebulization BID  . cefTRIAXone (ROCEPHIN)  IV  1 g Intravenous Q24H  . citalopram  10 mg Oral Daily  . furosemide  20 mg Oral Q48H  . levETIRAcetam  250 mg Oral BID  . lidocaine  1 patch Transdermal Q24H  . metoprolol tartrate  12.5 mg Oral BID  . nystatin  5 mL Oral QID  . pantoprazole  40  mg Oral QAC breakfast  . potassium chloride  40 mEq Oral BID  . sodium chloride  3 mL Intravenous Q12H  . tiotropium  18 mcg Inhalation Daily    Objective: Vital signs in last 24 hours: Temp:  [97.9 F (36.6 C)-98.1 F (36.7 C)] 98 F (36.7 C) (07/27 1441) Pulse Rate:  [77-88] 82 (07/27 1441) Resp:  [16-18] 16 (07/27 1441) BP: (96-133)/(52-69) 110/59 mmHg (07/27 1441) SpO2:  [93 %-100 %] 100 % (07/27 1441)   General appearance: alert and no distress Resp: clear to auscultation bilaterally Cardio: regular rate and rhythm GI: normal findings: bowel sounds normal and soft, non-tender  Lab Results  Recent Labs  07/29/13 0602 07/30/13 0358  WBC 16.2* 15.0*  HGB 12.3 11.6*  HCT 36.4 33.5*  NA 136* 137  K 3.0* 3.4*  CL 104 103  CO2 23 25  BUN 23 19  CREATININE 0.82 0.80   Liver Panel No results found for this basename: PROT, ALBUMIN, AST, ALT, ALKPHOS, BILITOT, BILIDIR, IBILI,  in the last 72  hours Sedimentation Rate No results found for this basename: ESRSEDRATE,  in the last 72 hours C-Reactive Protein No results found for this basename: CRP,  in the last 72 hours  Microbiology: Recent Results (from the past 240 hour(s))  MRSA PCR SCREENING     Status: None   Collection Time    07/25/13  6:41 PM      Result Value Ref Range Status   MRSA by PCR NEGATIVE  NEGATIVE Final   Comment:            The GeneXpert MRSA Assay (FDA     approved for NASAL specimens     only), is one component of a     comprehensive MRSA colonization     surveillance program. It is not     intended to diagnose MRSA     infection nor to guide or     monitor treatment for     MRSA infections.  URINE CULTURE     Status: None   Collection Time    07/26/13  4:00 PM      Result Value Ref Range Status   Specimen Description URINE, CLEAN CATCH   Final   Special Requests NONE   Final   Culture  Setup Time     Final   Value: 07/26/2013 19:19     Performed at [employer] [facility]     Final   Value: >=100,000 COLONIES/ML     Performed at [facility]   Culture     Final   Value: PROTEUS MIRABILIS     Performed at [facility]   Report Status 07/29/2013 FINAL   Final   Organism ID, Bacteria PROTEUS MIRABILIS   Final  URINE CULTURE     Status: None   Collection Time    07/26/13  4:00 PM      Result Value Ref Range Status   Specimen Description URINE, CLEAN CATCH   Final   Special Requests NONE   Final   Culture  Setup Time     Final   Value: 07/27/2013 04:47     Performed at [place]     Final   Value: >=100,000 COLONIES/ML     Performed at [facility]   Culture     Final   Value: PROTEUS MIRABILIS     Performed at [facility]  Report Status 07/29/2013 FINAL   Final   Organism ID, Bacteria PROTEUS MIRABILIS   Final  CULTURE, BLOOD (ROUTINE X 2)     Status: None   Collection Time    07/26/13  5:52 PM      Result  Value Ref Range Status   Specimen Description BLOOD LEFT HAND   Final   Special Requests BOTTLES DRAWN AEROBIC AND ANAEROBIC 10 CC   Final   Culture  Setup Time     Final   Value: 07/26/2013 22:01     Performed at [facility]   Culture     Final   Value: PROTEUS MIRABILIS     Note: SUSCEPTIBILITIES PERFORMED ON PREVIOUS CULTURE WITHIN THE LAST 5 DAYS.     STREPTOCOCCUS SPECIES     Note: Gram Stain Report Called to,Read Back By and Verified With: STEPHANIE D@10 :34AM ON 07/27/13 BY DANTS     Performed at [facility]   Report Status PENDING   Incomplete  CULTURE, BLOOD (ROUTINE X 2)     Status: None   Collection Time    07/26/13  5:57 PM      Result Value Ref Range Status   Specimen Description BLOOD LEFT ARM   Final   Special Requests BOTTLES DRAWN AEROBIC AND ANAEROBIC 10 CC   Final   Culture  Setup Time     Final   Value: 07/26/2013 22:01     Performed at [facility]   Culture     Final   Value: PROTEUS MIRABILIS     GRAM POSITIVE COCCI IN PAIRS     STREPTOCOCCUS SPECIES     Note: Gram Stain Report Called to,Read Back By and Verified With: STEPHANIE D@10 :34AM ON 07/27/13 BY DANTS     Performed at [facility]   Report Status PENDING   Incomplete   Organism ID, Bacteria PROTEUS MIRABILIS   Final  URINE CULTURE     Status: None   Collection Time    07/26/13 10:34 PM      Result Value Ref Range Status   Specimen Description URINE, RANDOM DIRECT ASPIRATE FROM KIDNEY   Final   Special Requests NONE   Final   Culture  Setup Time     Final   Value: 07/27/2013 04:48     Performed at [place]     Final   Value: >=100,000 COLONIES/ML     Performed at [facility]   Culture     Final   Value: PROTEUS MIRABILIS     Performed at [facility]   Report Status 07/29/2013 FINAL   Final   Organism ID, Bacteria PROTEUS MIRABILIS   Final  CLOSTRIDIUM DIFFICILE BY PCR     Status: None   Collection Time    07/28/13   1:51 PM      Result Value Ref Range Status   C difficile by pcr NEGATIVE  NEGATIVE Final   Comment: Performed at [place], BLOOD (ROUTINE X 2)     Status: None   Collection Time    07/29/13  9:24 AM      Result Value Ref Range Status   Specimen Description BLOOD RIGHT HAND   Final   Special Requests BOTTLES DRAWN AEROBIC ONLY 2CC   Final   Culture  Setup Time     Final   Value: 07/29/2013 14:07     Performed at [facility]  Culture     Final   Value: GRAM POSITIVE COCCI IN PAIRS     Note: Gram Stain Report Called to,Read Back By and Verified With: Sharyon Medicus 07/30/13 @ 1:26PM BY RUSCOE A.     Performed at [facility]   Report Status PENDING   Incomplete  CULTURE, BLOOD (ROUTINE X 2)     Status: None   Collection Time    07/29/13  9:39 AM      Result Value Ref Range Status   Specimen Description BLOOD LEFT HAND   Final   Special Requests BOTTLES DRAWN AEROBIC ONLY 2CC   Final   Culture  Setup Time     Final   Value: 07/29/2013 14:07     Performed at [facility]   Culture     Final   Value: GRAM POSITIVE COCCI IN CHAINS     Note: Gram Stain Report Called to,Read Back By and Verified With: JUNIOR RIMANDO 07/30/13 @ 7:30PM BY RUSCOE A.     Performed at [facility]   Report Status PENDING   Incomplete  CULTURE, BLOOD (ROUTINE X 2)     Status: None   Collection Time    07/30/13  4:15 PM      Result Value Ref Range Status   Specimen Description BLOOD RIGHT ARM   Final   Special Requests BOTTLES DRAWN AEROBIC AND ANAEROBIC 6 CC   Final   Culture  Setup Time     Final   Value: 07/30/2013 22:40     Performed at [facility]   Culture     Final   Value:        BLOOD CULTURE RECEIVED NO GROWTH TO DATE CULTURE WILL BE HELD FOR 5 DAYS BEFORE ISSUING A FINAL NEGATIVE REPORT     Performed at [facility]   Report Status PENDING   Incomplete    Studies/Results: Dg Shoulder Right  07/30/2013   CLINICAL DATA:   Pain for 1 week duration  EXAM: RIGHT SHOULDER - 2+ VIEW  COMPARISON:  None.  FINDINGS: Frontal and Y scapular images were obtained. There is mild narrowing of the glenohumeral joint. No fracture or dislocation. No erosive change or intra-articular calcification.  IMPRESSION: Mild osteoarthritic change in the glenohumeral joint. No fracture or dislocation.   Electronically Signed   By: Lowella Grip M.D.   On: 07/30/2013 09:36     Assessment/Plan: Urosepsis  P mirabilis (I unsayn, cipro; R-bactrim) in BCx and UCx  Streptococcal bacteremia 7-25 Hydronephrosis with percutaneous drain 7-22  Fluid overload (on CXR)  Chronic pancreatitis with stone  Chronic Diarrhea/Microscopic colits  PEN allergy  ARF  DNR/DNI  Total days of antibiotics: 5 ceftriaxone  Await ID of strep species Appears she had persistent bacteremia (Cx drawn7-25) while she was on anbx. Repeat BCx pending Will repeat in AM Would plan on 1 month of ceftriaxone (due to persistent bacteremia) as opposed to checking TEE      Bobby Rumpf Infectious Diseases (pager) [phone] [email or web address] 07/31/2013, 2:58 PM  LOS: 6 days   **Disclaimer: This note may have been dictated with voice recognition software. Similar sounding words can inadvertently be transcribed and this note may contain transcription errors which may not have been corrected upon publication of note.**

## 2013-07-31 NOTE — Progress Notes (Signed)
Subjective: Pt doing fair; still has some Rt shoulder discomfort (plain films neg); appetite diminished; mild left flank soreness; baseline dyspnea  Objective: Vital signs in last 24 hours: Temp:  [97.6 F (36.4 C)-99.1 F (37.3 C)] 97.9 F (36.6 C) (07/27 0528) Pulse Rate:  [77-91] 88 (07/27 0528) Resp:  [17-23] 17 (07/27 0528) BP: (88-133)/(43-69) 133/69 mmHg (07/27 0528) SpO2:  [93 %-100 %] 100 % (07/27 0528) Last BM Date: 07/30/13  Intake/Output from previous day: 07/26 0701 - 07/27 0700 In: 1248 [P.O.:960; I.V.:228; IV Piggyback:50] Out: 1960 [Urine:1960] Intake/Output this shift:    Left PCN intact, dressing dry, output 240 cc's yellow urine, site mildly tender to palpation; cx's - proteus/strept  Lab Results:   Recent Labs  07/29/13 0602 07/30/13 0358  WBC 16.2* 15.0*  HGB 12.3 11.6*  HCT 36.4 33.5*  PLT 39* 55*   BMET  Recent Labs  07/29/13 0602 07/30/13 0358  NA 136* 137  K 3.0* 3.4*  CL 104 103  CO2 23 25  GLUCOSE 89 92  BUN 23 19  CREATININE 0.82 0.80  CALCIUM 7.9* 7.8*   PT/INR No results found for this basename: LABPROT, INR,  in the last 72 hours ABG  Recent Labs  07/28/13 2006  PHART 7.380  HCO3 21.0    Studies/Results: Dg Shoulder Right  07/30/2013   CLINICAL DATA:  Pain for 1 week duration  EXAM: RIGHT SHOULDER - 2+ VIEW  COMPARISON:  None.  FINDINGS: Frontal and Y scapular images were obtained. There is mild narrowing of the glenohumeral joint. No fracture or dislocation. No erosive change or intra-articular calcification.  IMPRESSION: Mild osteoarthritic change in the glenohumeral joint. No fracture or dislocation.   Electronically Signed   By: Lowella Grip M.D.   On: 07/30/2013 09:36    Anti-infectives: Anti-infectives   Start     Dose/Rate Route Frequency Ordered Stop   07/29/13 2100  cefTRIAXone (ROCEPHIN) 1 g in dextrose 5 % 50 mL IVPB     1 g 100 mL/hr over 30 Minutes Intravenous Every 24 hours 07/29/13 2022     07/29/13 1800  cefTRIAXone (ROCEPHIN) 100 mg in dextrose 5 % 50 mL IVPB     100 mg 100 mL/hr over 30 Minutes Intravenous  Once 07/29/13 1729 07/29/13 1825   07/29/13 1000  levofloxacin (LEVAQUIN) IVPB 750 mg  Status:  Discontinued     750 mg 100 mL/hr over 90 Minutes Intravenous Every 48 hours 07/29/13 0811 07/29/13 1731   07/29/13 0800  levofloxacin (LEVAQUIN) IVPB 500 mg  Status:  Discontinued     500 mg 100 mL/hr over 60 Minutes Intravenous Every 48 hours 07/27/13 0748 07/29/13 0811   07/27/13 1800  vancomycin (VANCOCIN) 500 mg in sodium chloride 0.9 % 100 mL IVPB  Status:  Discontinued     500 mg 100 mL/hr over 60 Minutes Intravenous Every 24 hours 07/27/13 1417 07/29/13 1731   07/27/13 0900  levofloxacin (LEVAQUIN) IVPB 750 mg     750 mg 100 mL/hr over 90 Minutes Intravenous  Once 07/27/13 0748 07/27/13 1055   07/27/13 0600  aztreonam (AZACTAM) 1 g in dextrose 5 % 50 mL IVPB  Status:  Discontinued     1 g 100 mL/hr over 30 Minutes Intravenous 3 times per day 07/26/13 1938 07/29/13 1731   07/27/13 0200  metroNIDAZOLE (FLAGYL) IVPB 500 mg  Status:  Discontinued     500 mg 100 mL/hr over 60 Minutes Intravenous Every 8 hours 07/26/13 1822 07/26/13 1922  07/26/13 1945  aztreonam (AZACTAM) 2 g in dextrose 5 % 50 mL IVPB     2 g 100 mL/hr over 30 Minutes Intravenous NOW 07/26/13 1932 07/26/13 2148   07/26/13 1830  vancomycin (VANCOCIN) 500 mg in sodium chloride 0.9 % 100 mL IVPB  Status:  Discontinued     500 mg 100 mL/hr over 60 Minutes Intravenous Every 24 hours 07/26/13 1817 07/27/13 1038   07/26/13 1830  metroNIDAZOLE (FLAGYL) IVPB 500 mg  Status:  Discontinued     500 mg 100 mL/hr over 60 Minutes Intravenous  Once 07/26/13 1822 07/26/13 1922   07/26/13 1830  levofloxacin (LEVAQUIN) IVPB 750 mg  Status:  Discontinued     750 mg 100 mL/hr over 90 Minutes Intravenous Every 48 hours 07/26/13 1823 07/26/13 1930      Assessment/Plan: s/p left PCN 7/22; cont current tx, monitor CXR;  once over acute infectious issues can check nephrostogram, obtain f/u imaging to further assess etiology of prior hydro; antbx per ID  LOS: 6 days    Jovan Colligan,D [facility] 07/31/2013

## 2013-07-31 NOTE — Progress Notes (Addendum)
[facility] PROGRESS NOTE  Bridget Ball UXL:[id number] DOB: [birth year] DOA: 07/25/2013 PCP: Delphina Cahill, MD  Assessment/Plan: 80 y/o female with PMH of advanced COPD, smoker, anxiety, microscopic colitis, h/o SAH/seizure presented to [facility] On 7/21 w/ cc: abdominal pain, diarrhea, poor oral intake and diagnosed with left hydro.  -Transferred to [facility] from [facility] for consideration of perc drainage. On 7/22, She was transferred to ICU increased resp distress, hypotension  1. Sepsis/fever/UTI/septic shock off pressor; hemodynamically improved; possible DI ;improved  -7/22: blood cultures (GNR, GPC); final PROTEUS MIRABILIS; Patient was on (Vanc, levoflox, aztreonam) -7/25 repeat Blood cultures (GPC) per ID evaluation atx changed to ceftriaxone; appreciate ID input    2. Acute respiratory failure; CT chest no PE, COPD/emphysema, effusion,.  -improved on prn diuresis, oxygen, inhalers as needed; stopped smoking; desat study prior to d/c   3. COPD/em[pysema; cont oxygen, inhalers; needs desat study prior to d/c  4. Left UPJ obstruction with hydronephrosis -7/22 : s/p left percutaneous nephrostomy; cont per urology  5. Mild troponin elevation, suspect element of demand ischemia; no chest pain -echo: Systolic function was normal. The estimated ejection fraction was in the range of 53% to 66%; diastolic dysfunction  -clinically CHF, effusions; likely R sided HF, with TR; seen on CT; cont diuresis prn, replace lytes -carvedilol changed to metoprolol due to low BP   6. Thrombocytopenia likely sepsis related vs consumptive procedure IRL; possible DIC    -no s/s of bleeding; hold heparin; HIT antbd pend; TFused platelets on 7/24; TF prn;  7. Tachycardia likely sepsis related; d/c dig; BB as needed if BP allows  8. H/o seizure with SAH; restarted keppra    DVT prophylaxis; start SCD; hold heparin due to thrombocytopenia   Code Status: DNR Family Communication: patient, nurse;  updated family at the  bedside; BridgetLisa Daughter [phone]  (indicate person spoken with, relationship, and if by phone, the number) Disposition Plan: pend clinical improvement    Consultants:  Urology, critical care   Procedures: 7/22 : s/p left percutaneous nephrostomy  7/17 CT abdo >>> stable hepatic cysts, cholelithiasis without inflammation, new severe left hydronephrosis concerning for UPJ stenosis.  7/21 CT abdo >>> stable left sided UPJ obstruction with severe hydro, cholelithiasis, stable hepatic cysts.  SIGNIFICANT EVENTS:  7/17 - initial presentation to [facility], discharged home.  7/21 - presented again to [facility] ED for same symptoms (abd pain)  7/22 - transferred to [facility] for urological evaluation,- moved to ICU for respiratory distress- hypotensive in setting of sepsis, treated w/ crystalloid resuscitation & neo infusion. Percutaneous nephrostomy tube placed, cloudy and bloody drainage. Trop I elevated, cards consulted for tachycardia.  7/23: pain resolved. Still hypotensive but weaning pressors.    Antibiotics:  Aztreonam Vancomycin 7-22 <<<7/25 Levaquin 7-22 <<<7/25 Aztreonam 7/22<<<7/25 Ceftriaxone 7/25<<<<<    (indicate start date, and stop date if known)  HPI/Subjective: alert  Objective: Filed Vitals:   07/31/13 0528  BP: 133/69  Pulse: 88  Temp: 97.9 F (36.6 C)  Resp: 17    Intake/Output Summary (Last 24 hours) at 07/31/13 1040 Last data filed at 07/31/13 0700  Gross per 24 hour  Intake    668 ml  Output   1550 ml  Net   -882 ml   Filed Weights   07/25/13 1132 07/26/13 0500 07/30/13 0400  Weight: 46.409 kg (102 lb 5 oz) 46.3 kg (102 lb 1.2 oz) 51.1 kg (112 lb 10.5 oz)    Exam:   General:  alert  Cardiovascular: s1,s2  rrr  Respiratory: diminished AE LL  Abdomen: soft, nt,nd   Musculoskeletal: no LE edema   Data Reviewed: Basic Metabolic Panel:  Recent Labs Lab 07/26/13 1830 07/27/13 0535 07/28/13 0345 07/29/13 0602 07/30/13 0358  NA  138 135* 136* 136* 137  K 2.9* 3.5* 3.4* 3.0* 3.4*  CL 102 101 106 104 103  CO2 22 18* 19 23 25   GLUCOSE 85 177* 96 89 92  BUN 30* 27* 26* 23 19  CREATININE 1.33* 1.21* 0.93 0.82 0.80  CALCIUM 7.7* 7.8* 7.6* 7.9* 7.8*  MG  --  2.4  --   --   --   PHOS  --  2.1*  --   --   --    Liver Function Tests:  Recent Labs Lab 07/25/13 1225 07/26/13 0505 07/27/13 0535  AST 79* 40* 62*  ALT 62* 48* 49*  ALKPHOS 63 76 174*  BILITOT 0.4 0.3 0.7  PROT 5.9* 5.3* 4.9*  ALBUMIN 2.6* 2.2* 1.8*    Recent Labs Lab 07/25/13 1225  LIPASE 12   No results found for this basename: AMMONIA,  in the last 168 hours CBC:  Recent Labs Lab 07/25/13 1225  07/26/13 1830 07/27/13 0535 07/28/13 0345 07/28/13 0817 07/29/13 0602 07/30/13 0358  WBC 13.9*  < > 3.1* 20.4* 14.7*  --  16.2* 15.0*  NEUTROABS 13.3*  --   --   --   --   --   --   --   HGB 12.3  < > 10.8* 11.2* 11.3*  --  12.3 11.6*  HCT 35.9*  < > 32.4* 33.1* 33.6*  --  36.4 33.5*  MCV 94.2  < > 95.6 94.8 94.9  --  93.8 92.0  PLT 88*  < > PLATELET CLUMPS NOTED ON SMEAR, COUNT APPEARS DECREASED 42* 21* 17* 39* 55*  < > = values in this interval not displayed. Cardiac Enzymes:  Recent Labs Lab 07/25/13 2333 07/26/13 0505 07/26/13 1752 07/26/13 1830 07/27/13 0114 07/27/13 0540  CKTOTAL  --   --   --  63  --   --   CKMB  --   --   --  3.0  --   --   TROPONINI <0.30 <0.30 0.75*  --  0.39* <0.30   BNP (last 3 results) No results found for this basename: PROBNP,  in the last 8760 hours CBG: No results found for this basename: GLUCAP,  in the last 168 hours  Recent Results (from the past 240 hour(s))  MRSA PCR SCREENING     Status: None   Collection Time    07/25/13  6:41 PM      Result Value Ref Range Status   MRSA by PCR NEGATIVE  NEGATIVE Final   Comment:            The GeneXpert MRSA Assay (FDA     approved for NASAL specimens     only), is one component of a     comprehensive MRSA colonization     surveillance program.  It is not     intended to diagnose MRSA     infection nor to guide or     monitor treatment for     MRSA infections.  URINE CULTURE     Status: None   Collection Time    07/26/13  4:00 PM      Result Value Ref Range Status   Specimen Description URINE, CLEAN CATCH   Final   Special Requests NONE  Final   Culture  Setup Time     Final   Value: 07/26/2013 19:19     Performed at [place]     Final   Value: >=100,000 COLONIES/ML     Performed at [facility]   Culture     Final   Value: PROTEUS MIRABILIS     Performed at [facility]   Report Status 07/29/2013 FINAL   Final   Organism ID, Bacteria PROTEUS MIRABILIS   Final  URINE CULTURE     Status: None   Collection Time    07/26/13  4:00 PM      Result Value Ref Range Status   Specimen Description URINE, CLEAN CATCH   Final   Special Requests NONE   Final   Culture  Setup Time     Final   Value: 07/27/2013 04:47     Performed at [place]     Final   Value: >=100,000 COLONIES/ML     Performed at [facility]   Culture     Final   Value: PROTEUS MIRABILIS     Performed at [facility]   Report Status 07/29/2013 FINAL   Final   Organism ID, Bacteria PROTEUS MIRABILIS   Final  CULTURE, BLOOD (ROUTINE X 2)     Status: None   Collection Time    07/26/13  5:52 PM      Result Value Ref Range Status   Specimen Description BLOOD LEFT HAND   Final   Special Requests BOTTLES DRAWN AEROBIC AND ANAEROBIC 10 CC   Final   Culture  Setup Time     Final   Value: 07/26/2013 22:01     Performed at [facility]   Culture     Final   Value: PROTEUS MIRABILIS     Note: SUSCEPTIBILITIES PERFORMED ON PREVIOUS CULTURE WITHIN THE LAST 5 DAYS.     STREPTOCOCCUS SPECIES     Note: Gram Stain Report Called to,Read Back By and Verified With: STEPHANIE D@10 :34AM ON 07/27/13 BY DANTS     Performed at [facility]   Report Status PENDING    Incomplete  CULTURE, BLOOD (ROUTINE X 2)     Status: None   Collection Time    07/26/13  5:57 PM      Result Value Ref Range Status   Specimen Description BLOOD LEFT ARM   Final   Special Requests BOTTLES DRAWN AEROBIC AND ANAEROBIC 10 CC   Final   Culture  Setup Time     Final   Value: 07/26/2013 22:01     Performed at [facility]   Culture     Final   Value: PROTEUS MIRABILIS     GRAM POSITIVE COCCI IN PAIRS     STREPTOCOCCUS SPECIES     Note: Gram Stain Report Called to,Read Back By and Verified With: STEPHANIE D@10 :34AM ON 07/27/13 BY DANTS     Performed at [facility]   Report Status PENDING   Incomplete   Organism ID, Bacteria PROTEUS MIRABILIS   Final  URINE CULTURE     Status: None   Collection Time    07/26/13 10:34 PM      Result Value Ref Range Status   Specimen Description URINE, RANDOM DIRECT ASPIRATE FROM KIDNEY   Final   Special Requests NONE   Final   Culture  Setup Time  Final   Value: 07/27/2013 04:48     Performed at [place]     Final   Value: >=100,000 COLONIES/ML     Performed at [facility]   Culture     Final   Value: PROTEUS MIRABILIS     Performed at [facility]   Report Status 07/29/2013 FINAL   Final   Organism ID, Bacteria PROTEUS MIRABILIS   Final  CLOSTRIDIUM DIFFICILE BY PCR     Status: None   Collection Time    07/28/13  1:51 PM      Result Value Ref Range Status   C difficile by pcr NEGATIVE  NEGATIVE Final   Comment: Performed at [place], BLOOD (ROUTINE X 2)     Status: None   Collection Time    07/29/13  9:24 AM      Result Value Ref Range Status   Specimen Description BLOOD RIGHT HAND   Final   Special Requests BOTTLES DRAWN AEROBIC ONLY 2CC   Final   Culture  Setup Time     Final   Value: 07/29/2013 14:07     Performed at [facility]   Culture     Final   Value: GRAM POSITIVE COCCI IN PAIRS     Note: Gram Stain Report Called to,Read  Back By and Verified With: Sharyon Medicus 07/30/13 @ 1:26PM BY RUSCOE A.     Performed at [facility]   Report Status PENDING   Incomplete  CULTURE, BLOOD (ROUTINE X 2)     Status: None   Collection Time    07/29/13  9:39 AM      Result Value Ref Range Status   Specimen Description BLOOD LEFT HAND   Final   Special Requests BOTTLES DRAWN AEROBIC ONLY 2CC   Final   Culture  Setup Time     Final   Value: 07/29/2013 14:07     Performed at [facility]   Culture     Final   Value: GRAM POSITIVE COCCI IN CHAINS     Note: Gram Stain Report Called to,Read Back By and Verified With: JUNIOR RIMANDO 07/30/13 @ 7:30PM BY RUSCOE A.     Performed at [facility]   Report Status PENDING   Incomplete  CULTURE, BLOOD (ROUTINE X 2)     Status: None   Collection Time    07/30/13  4:15 PM      Result Value Ref Range Status   Specimen Description BLOOD RIGHT ARM   Final   Special Requests BOTTLES DRAWN AEROBIC AND ANAEROBIC 6 CC   Final   Culture  Setup Time     Final   Value: 07/30/2013 22:40     Performed at [facility]   Culture     Final   Value:        BLOOD CULTURE RECEIVED NO GROWTH TO DATE CULTURE WILL BE HELD FOR 5 DAYS BEFORE ISSUING A FINAL NEGATIVE REPORT     Performed at [facility]   Report Status PENDING   Incomplete     Studies: Dg Shoulder Right  07/30/2013   CLINICAL DATA:  Pain for 1 week duration  EXAM: RIGHT SHOULDER - 2+ VIEW  COMPARISON:  None.  FINDINGS: Frontal and Y scapular images were obtained. There is mild narrowing of the glenohumeral joint. No fracture or dislocation. No erosive change or intra-articular calcification.  IMPRESSION: Mild  osteoarthritic change in the glenohumeral joint. No fracture or dislocation.   Electronically Signed   By: Lowella Grip M.D.   On: 07/30/2013 09:36    Scheduled Meds: . antiseptic oral rinse  15 mL Mouth Rinse BID  . arformoterol  15 mcg Nebulization BID  . atorvastatin  80 mg Oral q1800   . budesonide  3 mg Oral BID  . budesonide (PULMICORT) nebulizer solution  0.5 mg Nebulization BID  . carvedilol  3.125 mg Oral BID WC  . cefTRIAXone (ROCEPHIN)  IV  1 g Intravenous Q24H  . citalopram  10 mg Oral Daily  . furosemide  20 mg Oral Q48H  . levETIRAcetam  250 mg Oral BID  . lidocaine  1 patch Transdermal Q24H  . nystatin  5 mL Oral QID  . pantoprazole  40 mg Oral QAC breakfast  . potassium chloride  40 mEq Oral BID  . sodium chloride  3 mL Intravenous Q12H  . tiotropium  18 mcg Inhalation Daily   Continuous Infusions: . sodium chloride 10 mL/hr at 07/28/13 1810    Principal Problem:   AKI (acute kidney injury) Active Problems:   Diarrhea   Abdominal pain, unspecified site   Hydronephrosis, left   Dehydration   Elevated troponin    Time spent: >35 minutes     Kinnie Feil  [facility] Pager [phone]. If 7PM-7AM, please contact night-coverage at [email or web address], password [facility] 07/31/2013, 10:40 AM  LOS: 6 days             D/w patient, family; They would like to try morphine for pain control; no allergy in the past, but some sedation; will watch   Goro Wenrick

## 2013-07-31 NOTE — Progress Notes (Signed)
Speech Language Pathology Treatment: Dysphagia  Patient Details Name: Bridget Ball MRN: [id number] DOB: 01-Apr-1928 Today's Date: 07/31/2013 Time: 2589-4834 SLP Time Calculation (min): 25 min  Assessment / Plan / Recommendation Clinical Impression  Pt seen to assess tolerance of po diet and readiness for dietary advancement.  Pt with poor intake and dislikes puree/thin diet.  Oral sores noted but pt agreeable to trying solids with dentures in place.  Observed pt consume water, coffee, fruit and graham cracker.  No symptoms of aspiration/airway compromise or significant oral deficits. Pt's denture are loose - fixodent used - and advised pt to assure teeth fit adequately for solid consumption.   Pt reports being ready for diet to be advanced and SLP agrees given her awareness to dysphagia.  Educated pt/family to aspiration precautions/compensations to migitate aspiration risk.    Recommend advance to regular/thin to allow pt to choose items desired.  Provided daughter with written strategies.  Will sign off as all education completed, please reorder if desire.    HPI HPI: 80 year old female admitted 07/25/13 due to somnolence, diarrhea and decreased po intake. PMH significant for COPD, TIA (2011), decreased memory, hiatal hernia, EGD (2015), seizures due to [facility]. BSE ordered to evaluate swallow function and safety and identify least restrictive diet.   Pertinent Vitals Afebrile, decreased  SLP Plan  All goals met    Recommendations Diet recommendations: Regular;Thin liquid (to allow pt to choose foods she can manage with her mouth sores) Liquids provided via: Cup;Straw Medication Administration: Whole meds with liquid Supervision: Staff to assist with self feeding;Full supervision/cueing for compensatory strategies Compensations: Small sips/bites;Slow rate Postural Changes and/or Swallow Maneuvers: Seated upright 90 degrees;Upright 30-60 min after meal              Oral Care Recommendations:  Oral care BID Follow up Recommendations: None Plan: All goals met    [place], [place] [facility] SLP [phone]

## 2013-07-31 NOTE — Evaluation (Signed)
Occupational Therapy Evaluation Patient Details Name: Bridget Ball MRN: [id number] DOB: 1928/06/01 Today's Date: 07/31/2013    History of Present Illness Pt s/p /p left percutaneous nephrostomy.   Clinical Impression   Pt presents to OT with decreased I with ADL activity due to problems listed below. Pt will benefit from skilled OT to increase I with ADL activity and return to PLOF of I    Follow Up Recommendations  SNF    Equipment Recommendations  None recommended by OT           Mobility Bed Mobility Overal bed mobility: Needs Assistance Bed Mobility: Supine to Sit     Supine to sit: Mod assist        Transfers Overall transfer level: Needs assistance Equipment used: 2 person hand held assist Transfers: Sit to/from Stand;Stand Pivot Transfers Sit to Stand: +2 physical assistance;+2 safety/equipment;Max assist Stand pivot transfers: +2 physical assistance;+2 safety/equipment;Max assist       General transfer comment: Bed to [facility] and then back to bed. Pt needed verbal cues, reassurance and hands on A. Pt fatigued quickly         ADL Overall ADL's : Needs assistance/impaired Eating/Feeding: Minimal assistance;Sitting   Grooming: Minimal assistance;Sitting   Upper Body Bathing: Moderate assistance;Sitting   Lower Body Bathing: Sitting/lateral leans;Maximal assistance   Upper Body Dressing : Minimal assistance;Sitting   Lower Body Dressing: Sit to/from stand;Sitting/lateral leans;Maximal assistance   Toilet Transfer: +2 for safety/equipment;+2 for physical assistance;BSC;Maximal assistance   Toileting- Clothing Manipulation and Hygiene: Sit to/from stand;+2 for physical assistance;Total assistance         General ADL Comments: Near end of session- pt did decide she wanted to get on [facility]                        Extremity/Trunk Assessment Upper Extremity Assessment Upper Extremity Assessment: RUE deficits/detail;LUE deficits/detail RUE  Deficits / Details: PROM impaired.  Pt reports this is a new problem.   LUE Deficits / Details: WFL           Communication Communication Communication: No difficulties   Cognition Arousal/Alertness: Awake/alert Behavior During Therapy: WFL for tasks assessed/performed Overall Cognitive Status: Within Functional Limits for tasks assessed                           Shoulder Instructions  Pts R shoulder very sore.  Films were negative. Educated pt and daughter on propping RUE on pillow in which to keep in a supported position.    Home Living Family/patient expects to be discharged to:: Private residence Living Arrangements: Children Available Help at Discharge: Family Type of Home: House Home Access: Stairs to enter     Home Layout: Laundry or work area in basement;Able to live on main level with bedroom/bathroom     Bathroom Shower/Tub: Tub/shower unit;Walk-in shower   Bathroom Toilet: Handicapped height     Home Equipment: [place] - single point;Walker - 2 wheels          Prior Functioning/Environment Level of Independence: Independent             OT Diagnosis: Generalized weakness;Acute pain   OT Problem List: Decreased strength;Decreased activity tolerance;Impaired balance (sitting and/or standing);Impaired UE functional use   OT Treatment/Interventions: Self-care/ADL training;Patient/family education;Therapeutic activities    OT Goals(Current goals can be found in the care plan section) Acute Rehab OT Goals Patient Stated Goal: get back to being I OT  Goal Formulation: With patient Time For Goal Achievement: 08/07/13 Potential to Achieve Goals: Good ADL Goals Pt Will Perform Grooming: with supervision;standing Pt Will Transfer to Toilet: with supervision;bedside commode;ambulating Pt Will Perform Toileting - Clothing Manipulation and hygiene: with min assist;sit to/from stand  OT Frequency: Min 2X/week              End of Session Nurse  Communication: Mobility status  Activity Tolerance: Patient limited by fatigue Patient left: in bed;with call bell/phone within reach;with family/visitor present   Time: 1201-1248 OT Time Calculation (min): 47 min Charges:  OT General Charges $OT Visit: 1 Procedure OT Evaluation $Initial OT Evaluation Tier I: 1 Procedure OT Treatments $Self Care/Home Management : 38-52 mins G-Codes:    Payton Mccallum D 08-24-13, 12:52 PM

## 2013-07-31 NOTE — Progress Notes (Signed)
PT Cancellation Note  Patient Details Name: Bridget Ball MRN: [id number] DOB: Mar 17, 1928   Cancelled Treatment:    Reason Eval/Treat Not Completed: pt declined to participate-reports "feeling sick". Will check back another day.   Weston Anna, MPT Pager: [phone]

## 2013-08-01 DIAGNOSIS — B952 Enterococcus as the cause of diseases classified elsewhere: Secondary | ICD-10-CM

## 2013-08-01 LAB — CBC
HCT: 30.7 % — ABNORMAL LOW (ref 36.0–46.0)
Hemoglobin: 10.5 g/dL — ABNORMAL LOW (ref 12.0–15.0)
MCH: 32 pg (ref 26.0–34.0)
MCHC: 34.2 g/dL (ref 30.0–36.0)
MCV: 93.6 fL (ref 78.0–100.0)
PLATELETS: 127 10*3/uL — AB (ref 150–400)
RBC: 3.28 MIL/uL — AB (ref 3.87–5.11)
RDW: 15.4 % (ref 11.5–15.5)
WBC: 10.1 10*3/uL (ref 4.0–10.5)

## 2013-08-01 LAB — CULTURE, BLOOD (ROUTINE X 2)

## 2013-08-01 MED ORDER — ZOLPIDEM TARTRATE 5 MG PO TABS
2.5000 mg | ORAL_TABLET | Freq: Every evening | ORAL | Status: DC | PRN
  Administered 2013-08-01 – 2013-08-02 (×2): 2.5 mg via ORAL
  Filled 2013-08-01 (×2): qty 1

## 2013-08-01 MED ORDER — HEPARIN SODIUM (PORCINE) 5000 UNIT/ML IJ SOLN
5000.0000 [IU] | Freq: Three times a day (TID) | INTRAMUSCULAR | Status: DC
  Administered 2013-08-01 – 2013-08-03 (×6): 5000 [IU] via SUBCUTANEOUS
  Filled 2013-08-01 (×9): qty 1

## 2013-08-01 MED ORDER — LIDOCAINE VISCOUS 2 % MT SOLN
15.0000 mL | Freq: Four times a day (QID) | OROMUCOSAL | Status: DC | PRN
  Administered 2013-08-01: 15 mL via OROMUCOSAL
  Filled 2013-08-01: qty 15

## 2013-08-01 MED ORDER — VITAMINS A & D EX OINT
TOPICAL_OINTMENT | CUTANEOUS | Status: AC
  Administered 2013-08-01: 10
  Filled 2013-08-01: qty 10

## 2013-08-01 MED ORDER — VANCOMYCIN HCL IN DEXTROSE 750-5 MG/150ML-% IV SOLN
750.0000 mg | INTRAVENOUS | Status: DC
  Administered 2013-08-01 – 2013-08-02 (×2): 750 mg via INTRAVENOUS
  Filled 2013-08-01 (×3): qty 150

## 2013-08-01 NOTE — Progress Notes (Signed)
ANTICOAGULATION CONSULT NOTE - Initial Consult  Pharmacy Consult for Heparin Indication: VTE prophylaxis  Allergies  Allergen Reactions  . Penicillins Anaphylaxis    Throat swelling as a child. Tolerated ceftriaxone 07/29/13.  . Dilaudid [Hydromorphone Hcl]     Severe sedation per son    Labs:  Recent Labs  07/30/13 0358 08/01/13 0006  HGB 11.6* 10.5*  HCT 33.5* 30.7*  PLT 55* 127*  CREATININE 0.80  --     Estimated Creatinine Clearance: 40.9 ml/min (by C-G formula based on Cr of 0.8).   Assessment: 29 yoF admitted 7/21 with sepsis and started on Lovenox for VTE prophylaxis.  She developed thrombocytopenia (plt decreased to 88 from 265 prior to admission).  Lovenox was d/c on 7/21; platelets were transfused on 7/24.  Now, HIT antibody is negative.  Pharmacy consulted to dose Heparin for VTE prophylaxis.  Hgb 10.5  Plt 127 (improved)  HIT testing:  PF4 Ab negative, UFH SRA negative  Goal of Therapy:  VTE prophylaxis    Plan:  Heparin 5000 units SQ TID Pharmacy to sign off, please reconsult for further needs.   Gretta Arab PharmD, BCPS Pager [phone] 08/01/2013 12:29 PM

## 2013-08-01 NOTE — Evaluation (Signed)
Physical Therapy Evaluation Patient Details Name: Bridget Ball MRN: [id number] DOB: May 07, 1928 Today's Date: 08/01/2013   History of Present Illness  80 yo female admitted with septic shock, acute resp failure. s/p L percutaneous nephrostomy 7/22. Pt was Ind prior to admission  Clinical Impression  On eval, pt required Min assist +2 for mobility-only able to tolerate ~5 feet ambulation distance on today. Pt is deconditioned. Will likely need ST rehab at [facility] for continued rehab.     Follow Up Recommendations SNF    Equipment Recommendations  Rolling walker with 5" wheels    Recommendations for Other Services OT consult     Precautions / Restrictions Precautions Precautions: Fall Precaution Comments: drain L side Restrictions Weight Bearing Restrictions: No      Mobility  Bed Mobility Overal bed mobility: Needs Assistance Bed Mobility: Supine to Sit     Supine to sit: Mod assist     General bed mobility comments: Assist for bil LEs and trunk to upright. Increased time.   Transfers Overall transfer level: Needs assistance Equipment used: Rolling walker (2 wheeled) Transfers: Sit to/from Stand Sit to Stand: Min assist;+2 physical assistance;+2 safety/equipment Stand pivot transfers: Min assist;+2 safety/equipment;+2 physical assistance       General transfer comment: Assist to rise, stabilize, control descent. Multimodal cues for safety, technique, hand placement. Sit to stand x 3-pt sat abruptly each time due to LE pain, fatigue, weakness.   Ambulation/Gait Ambulation/Gait assistance: Min assist;+2 safety/equipment;+2 physical assistance Ambulation Distance (Feet): 5 Feet Assistive device: Rolling walker (2 wheeled) Gait Pattern/deviations: Step-to pattern;Trunk flexed;Decreased stride length     General Gait Details: assist to support/stabilize pt, maneuver with walker. pt is very deconditioned. c/o bil LE pain posterior legs during attempt at ambulation. Only  able to tolerate short distance.   Stairs            Wheelchair Mobility    Modified Rankin (Stroke Patients Only)       Balance Overall balance assessment: Needs assistance         Standing balance support: Bilateral upper extremity supported;During functional activity Standing balance-Leahy Scale: Poor                               Pertinent Vitals/Pain bil posterior legs 7/10 with attempts at ambulation    Home Living Family/patient expects to be discharged to:: Private residence Living Arrangements: Children Available Help at Discharge: Family Type of Home: House Home Access: Stairs to enter Entrance Stairs-Rails: None Entrance Stairs-Number of Steps: 4 Home Layout: Laundry or work area in basement;Able to live on main level with bedroom/bathroom Home Equipment: Kasandra Knudsen - single point;Walker - 2 wheels      Prior Function Level of Independence: Independent               Hand Dominance        Extremity/Trunk Assessment   Upper Extremity Assessment: Defer to OT evaluation           Lower Extremity Assessment: Generalized weakness      Cervical / Trunk Assessment: Kyphotic  Communication   Communication: No difficulties  Cognition Arousal/Alertness: Awake/alert Behavior During Therapy: WFL for tasks assessed/performed Overall Cognitive Status: Within Functional Limits for tasks assessed                      General Comments      Exercises General Exercises - Lower Extremity Ankle Circles/Pumps: AROM;Both;10  reps;Seated Quad Sets: AROM;Both;10 reps;Seated Long Arc Quad: AROM;Both;10 reps;Seated Hip ABduction/ADduction: AAROM;Both (8 reps)      Assessment/Plan    PT Assessment Patient needs continued PT services  PT Diagnosis Difficulty walking;Abnormality of gait;Generalized weakness;Acute pain   PT Problem List Decreased strength;Decreased activity tolerance;Decreased balance;Decreased  mobility;Pain;Decreased knowledge of use of DME  PT Treatment Interventions DME instruction;Gait training;Functional mobility training;Therapeutic exercise;Patient/family education;Balance training   PT Goals (Current goals can be found in the Care Plan section) Acute Rehab PT Goals Patient Stated Goal: get back to being Ind PT Goal Formulation: With patient/family Time For Goal Achievement: 08/15/13 Potential to Achieve Goals: Good    Frequency Min 3X/week   Barriers to discharge        Co-evaluation               End of Session Equipment Utilized During Treatment: Gait belt Activity Tolerance: Patient limited by fatigue;Patient limited by pain Patient left: in chair;with call bell/phone within reach;with family/visitor present           Time: 1024-1050 PT Time Calculation (min): 26 min   Charges:   PT Evaluation $Initial PT Evaluation Tier I: 1 Procedure PT Treatments $Gait Training: 8-22 mins $Therapeutic Activity: 8-22 mins   PT G Codes:          Weston Anna, MPT Pager: [phone]

## 2013-08-01 NOTE — Progress Notes (Signed)
Reviewed and agree with current assessment. Patient sleeping, no change in plan of car. Will continue to monitor. SRP, RN

## 2013-08-01 NOTE — Progress Notes (Addendum)
INFECTIOUS DISEASE PROGRESS NOTE  ID: Bridget Ball is a 80 y.o. female with  Principal Problem:   AKI (acute kidney injury) Active Problems:   Diarrhea   Abdominal pain, unspecified site   Hydronephrosis, left   Dehydration   Elevated troponin  Subjective: Without complaints. Per family sleeping during the day, awake at night.   Abtx:  Anti-infectives   Start     Dose/Rate Route Frequency Ordered Stop   08/01/13 1600  vancomycin (VANCOCIN) IVPB 750 mg/150 ml premix     750 mg 150 mL/hr over 60 Minutes Intravenous Every 24 hours 08/01/13 1422     07/29/13 2100  cefTRIAXone (ROCEPHIN) 1 g in dextrose 5 % 50 mL IVPB     1 g 100 mL/hr over 30 Minutes Intravenous Every 24 hours 07/29/13 2022     07/29/13 1800  cefTRIAXone (ROCEPHIN) 100 mg in dextrose 5 % 50 mL IVPB     100 mg 100 mL/hr over 30 Minutes Intravenous  Once 07/29/13 1729 07/29/13 1825   07/29/13 1000  levofloxacin (LEVAQUIN) IVPB 750 mg  Status:  Discontinued     750 mg 100 mL/hr over 90 Minutes Intravenous Every 48 hours 07/29/13 0811 07/29/13 1731   07/29/13 0800  levofloxacin (LEVAQUIN) IVPB 500 mg  Status:  Discontinued     500 mg 100 mL/hr over 60 Minutes Intravenous Every 48 hours 07/27/13 0748 07/29/13 0811   07/27/13 1800  vancomycin (VANCOCIN) 500 mg in sodium chloride 0.9 % 100 mL IVPB  Status:  Discontinued     500 mg 100 mL/hr over 60 Minutes Intravenous Every 24 hours 07/27/13 1417 07/29/13 1731   07/27/13 0900  levofloxacin (LEVAQUIN) IVPB 750 mg     750 mg 100 mL/hr over 90 Minutes Intravenous  Once 07/27/13 0748 07/27/13 1055   07/27/13 0600  aztreonam (AZACTAM) 1 g in dextrose 5 % 50 mL IVPB  Status:  Discontinued     1 g 100 mL/hr over 30 Minutes Intravenous 3 times per day 07/26/13 1938 07/29/13 1731   07/27/13 0200  metroNIDAZOLE (FLAGYL) IVPB 500 mg  Status:  Discontinued     500 mg 100 mL/hr over 60 Minutes Intravenous Every 8 hours 07/26/13 1822 07/26/13 1922   07/26/13 1945  aztreonam  (AZACTAM) 2 g in dextrose 5 % 50 mL IVPB     2 g 100 mL/hr over 30 Minutes Intravenous NOW 07/26/13 1932 07/26/13 2148   07/26/13 1830  vancomycin (VANCOCIN) 500 mg in sodium chloride 0.9 % 100 mL IVPB  Status:  Discontinued     500 mg 100 mL/hr over 60 Minutes Intravenous Every 24 hours 07/26/13 1817 07/27/13 1038   07/26/13 1830  metroNIDAZOLE (FLAGYL) IVPB 500 mg  Status:  Discontinued     500 mg 100 mL/hr over 60 Minutes Intravenous  Once 07/26/13 1822 07/26/13 1922   07/26/13 1830  levofloxacin (LEVAQUIN) IVPB 750 mg  Status:  Discontinued     750 mg 100 mL/hr over 90 Minutes Intravenous Every 48 hours 07/26/13 1823 07/26/13 1930      Medications:  Scheduled: . antiseptic oral rinse  15 mL Mouth Rinse BID  . arformoterol  15 mcg Nebulization BID  . atorvastatin  80 mg Oral q1800  . budesonide  3 mg Oral BID  . budesonide (PULMICORT) nebulizer solution  0.5 mg Nebulization BID  . cefTRIAXone (ROCEPHIN)  IV  1 g Intravenous Q24H  . citalopram  10 mg Oral Daily  . furosemide  20  mg Oral Q48H  . heparin subcutaneous  5,000 Units Subcutaneous 3 times per day  . levETIRAcetam  250 mg Oral BID  . lidocaine  1 patch Transdermal Q24H  . metoprolol tartrate  12.5 mg Oral BID  . nystatin  5 mL Oral QID  . pantoprazole  40 mg Oral QAC breakfast  . potassium chloride  40 mEq Oral BID  . sodium chloride  3 mL Intravenous Q12H  . tiotropium  18 mcg Inhalation Daily  . vancomycin  750 mg Intravenous Q24H    Objective: Vital signs in last 24 hours: Temp:  [97.5 F (36.4 C)-98.4 F (36.9 C)] 98.1 F (36.7 C) (07/28 1356) Pulse Rate:  [79-90] 87 (07/28 1356) Resp:  [16-24] 20 (07/28 1356) BP: (105-125)/(53-81) 108/53 mmHg (07/28 1356) SpO2:  [95 %-100 %] 97 % (07/28 1356)   General appearance: alert, cooperative and no distress Resp: clear to auscultation bilaterally Cardio: regular rate and rhythm GI: normal findings: bowel sounds normal and soft, non-tender  Lab  Results  Recent Labs  07/30/13 0358 08/01/13 0006  WBC 15.0* 10.1  HGB 11.6* 10.5*  HCT 33.5* 30.7*  NA 137  --   K 3.4*  --   CL 103  --   CO2 25  --   BUN 19  --   CREATININE 0.80  --    Liver Panel No results found for this basename: PROT, ALBUMIN, AST, ALT, ALKPHOS, BILITOT, BILIDIR, IBILI,  in the last 72 hours Sedimentation Rate No results found for this basename: ESRSEDRATE,  in the last 72 hours C-Reactive Protein No results found for this basename: CRP,  in the last 72 hours  Microbiology: Recent Results (from the past 240 hour(s))  MRSA PCR SCREENING     Status: None   Collection Time    07/25/13  6:41 PM      Result Value Ref Range Status   MRSA by PCR NEGATIVE  NEGATIVE Final   Comment:            The GeneXpert MRSA Assay (FDA     approved for NASAL specimens     only), is one component of a     comprehensive MRSA colonization     surveillance program. It is not     intended to diagnose MRSA     infection nor to guide or     monitor treatment for     MRSA infections.  URINE CULTURE     Status: None   Collection Time    07/26/13  4:00 PM      Result Value Ref Range Status   Specimen Description URINE, CLEAN CATCH   Final   Special Requests NONE   Final   Culture  Setup Time     Final   Value: 07/26/2013 19:19     Performed at [place]     Final   Value: >=100,000 COLONIES/ML     Performed at [facility]   Culture     Final   Value: PROTEUS MIRABILIS     Performed at [facility]   Report Status 07/29/2013 FINAL   Final   Organism ID, Bacteria PROTEUS MIRABILIS   Final  URINE CULTURE     Status: None   Collection Time    07/26/13  4:00 PM      Result Value Ref Range Status   Specimen Description URINE, CLEAN CATCH   Final   Special Requests NONE  Final   Culture  Setup Time     Final   Value: 07/27/2013 04:47     Performed at [place]     Final   Value: >=100,000  COLONIES/ML     Performed at [facility]   Culture     Final   Value: PROTEUS MIRABILIS     Performed at [facility]   Report Status 07/29/2013 FINAL   Final   Organism ID, Bacteria PROTEUS MIRABILIS   Final  CULTURE, BLOOD (ROUTINE X 2)     Status: None   Collection Time    07/26/13  5:52 PM      Result Value Ref Range Status   Specimen Description BLOOD LEFT HAND   Final   Special Requests BOTTLES DRAWN AEROBIC AND ANAEROBIC 10 CC   Final   Culture  Setup Time     Final   Value: 07/26/2013 22:01     Performed at [facility]   Culture     Final   Value: PROTEUS MIRABILIS     Note: SUSCEPTIBILITIES PERFORMED ON PREVIOUS CULTURE WITHIN THE LAST 5 DAYS.     ENTEROCOCCUS SPECIES     Note: SUSCEPTIBILITIES PERFORMED ON PREVIOUS CULTURE WITHIN LAST 5 DAYS     Note: Gram Stain Report Called to,Read Back By and Verified With: STEPHANIE D@10 :34AM ON 07/27/13 BY DANTS   Report Status 08/01/2013 FINAL   Final  CULTURE, BLOOD (ROUTINE X 2)     Status: None   Collection Time    07/26/13  5:57 PM      Result Value Ref Range Status   Specimen Description BLOOD LEFT ARM   Final   Special Requests BOTTLES DRAWN AEROBIC AND ANAEROBIC 10 CC   Final   Culture  Setup Time     Final   Value: 07/26/2013 22:01     Performed at [facility]   Culture     Final   Value: PROTEUS MIRABILIS     ENTEROCOCCUS SPECIES     Note: COMBINATION THERAPY OF HIGH DOSE AMPICILLIN OR VANCOMYCIN, PLUS AN AMINOGLYCOSIDE, IS USUALLY INDICATED FOR SERIOUS ENTEROCOCCAL INFECTIONS.     Note: Gram Stain Report Called to,Read Back By and Verified With: STEPHANIE D@10 :34AM ON 07/27/13 BY DANTS     Performed at [facility]   Report Status 08/01/2013 FINAL   Final   Organism ID, Bacteria PROTEUS MIRABILIS   Final   Organism ID, Bacteria ENTEROCOCCUS SPECIES   Final  URINE CULTURE     Status: None   Collection Time    07/26/13 10:34 PM      Result Value Ref Range Status   Specimen  Description URINE, RANDOM DIRECT ASPIRATE FROM KIDNEY   Final   Special Requests NONE   Final   Culture  Setup Time     Final   Value: 07/27/2013 04:48     Performed at [place]     Final   Value: >=100,000 COLONIES/ML     Performed at [facility]   Culture     Final   Value: PROTEUS MIRABILIS     Performed at [facility]   Report Status 07/29/2013 FINAL   Final   Organism ID, Bacteria PROTEUS MIRABILIS   Final  CLOSTRIDIUM DIFFICILE BY PCR     Status: None   Collection Time    07/28/13  1:51 PM  Result Value Ref Range Status   C difficile by pcr NEGATIVE  NEGATIVE Final   Comment: Performed at [place], BLOOD (ROUTINE X 2)     Status: None   Collection Time    07/29/13  9:24 AM      Result Value Ref Range Status   Specimen Description BLOOD RIGHT HAND   Final   Special Requests BOTTLES DRAWN AEROBIC ONLY 2CC   Final   Culture  Setup Time     Final   Value: 07/29/2013 14:07     Performed at [facility]   Culture     Final   Value: ENTEROCOCCUS SPECIES     Note: SUSCEPTIBILITIES PERFORMED ON PREVIOUS CULTURE WITHIN THE LAST 5 DAYS.     Note: Gram Stain Report Called to,Read Back By and Verified With: Sharyon Medicus 07/30/13 @ 1:26PM BY RUSCOE A.     Performed at [facility]   Report Status 08/01/2013 FINAL   Final  CULTURE, BLOOD (ROUTINE X 2)     Status: None   Collection Time    07/29/13  9:39 AM      Result Value Ref Range Status   Specimen Description BLOOD LEFT HAND   Final   Special Requests BOTTLES DRAWN AEROBIC ONLY 2CC   Final   Culture  Setup Time     Final   Value: 07/29/2013 14:07     Performed at [facility]   Culture     Final   Value: ENTEROCOCCUS SPECIES     Note: SUSCEPTIBILITIES PERFORMED ON PREVIOUS CULTURE WITHIN THE LAST 5 DAYS.     Note: Gram Stain Report Called to,Read Back By and Verified With: JUNIOR RIMANDO 07/30/13 @ 7:30PM BY RUSCOE A.     Performed at  [facility]   Report Status 08/01/2013 FINAL   Final  CULTURE, BLOOD (ROUTINE X 2)     Status: None   Collection Time    07/30/13  4:15 PM      Result Value Ref Range Status   Specimen Description BLOOD RIGHT ARM   Final   Special Requests BOTTLES DRAWN AEROBIC AND ANAEROBIC 6 CC   Final   Culture  Setup Time     Final   Value: 07/30/2013 22:40     Performed at [facility]   Culture     Final   Value:        BLOOD CULTURE RECEIVED NO GROWTH TO DATE CULTURE WILL BE HELD FOR 5 DAYS BEFORE ISSUING A FINAL NEGATIVE REPORT     Performed at [facility]   Report Status PENDING   Incomplete    Studies/Results: No results found.   Assessment/Plan: Urosepsis  P mirabilis (I unsayn, cipro; R-bactrim) in BCx and UCx   Enterococcal bacteremia 7-25  Hydronephrosis with percutaneous drain 7-22  Fluid overload (on CXR)  Chronic pancreatitis with stone  Chronic Diarrhea/Microscopic colits  PEN allergy  ARF  DNR/DNI  Total days of antibiotics: 6 ceftriaxone.     Day 1 vanco  7-22 vanco 7-25  7-28 vanco   Agree with vanco restart.  Plan on getting vanco for 1 month.  Can stop ceftriaxone at 3 weeks.  Await her repeat BCx TTE done 7-22 mentions only valvular calcification.  My great appreciation to [facility] (pager) [phone] [email or web address] 08/01/2013, 2:40 PM  LOS: 7 days   **Disclaimer: This note  may have been dictated with voice recognition software. Similar sounding words can inadvertently be transcribed and this note may contain transcription errors which may not have been corrected upon publication of note.**

## 2013-08-01 NOTE — Progress Notes (Signed)
[facility] PROGRESS NOTE  Bridget Ball NAT:[id number] DOB: 1928/01/18 DOA: 07/25/2013 PCP: Bridget Cahill, MD  Assessment/Plan: 80 y/o female with PMH of advanced COPD, smoker, anxiety, microscopic colitis, h/o SAH/seizure presented to [facility] On 7/21 w/ cc: abdominal pain, diarrhea, poor oral intake and diagnosed with left hydro.  -Transferred to [facility] from [facility] for consideration of perc drainage. On 7/22, She was transferred to ICU increased resp distress, hypotension  1. Sepsis/fever/UTI/septic shock off pressor; hemodynamically improved; possible DIC ;improved  -7/22: blood cultures (GNR, GPC); final PROTEUS MIRABILIS; Patient was on (Vanc, levoflox, aztreonam) -7/25 repeat Blood cultures +(GPC) while on atx;  per ID evaluation atx changed to ceftriaxone; -7/28 repeated blood cultures pend; per ID: 1 month of ceftriaxone;  appreciate ID input    2. Acute respiratory failure; CT chest no PE, COPD/emphysema, effusion,.  -improved on prn diuresis, oxygen, inhalers as needed; stopped smoking; desat study prior to d/c   3. COPD/empysema; cont oxygen, inhalers; needs desat study prior to d/c  4. Left UPJ obstruction with hydronephrosis -7/22 : s/p left percutaneous nephrostomy; cont per urology  5. Mild troponin elevation, suspect element of demand ischemia; no chest pain -echo: Systolic function was normal. The estimated ejection fraction was in the range of 42% to 70%; diastolic dysfunction  -clinically CHF, effusions; likely R sided HF, with TR; seen on CT; cont diuresis prn, replace lytes -carvedilol changed to metoprolol due to low BP   6. Thrombocytopenia likely sepsis related vs consumptive procedure IRL; possible DIC    -no s/s of bleeding; hold heparin; HIT antbd  negative; TFused platelets on 7/24; TF prn;  7. Tachycardia likely sepsis related; d/c dig; BB as needed if BP allows  8. H/o seizure with SAH; restarted keppra   D/c plans pend repeat blood cultures if negative needs PICC:  PT eval, urology input   DVT prophylaxis; start SCD; resume heparin; no HIT;  thrombocytopenia resolved   Code Status: DNR Family Communication: patient, nurse;  updated family at the bedside; Plate,Bridget Ball Daughter [phone]  (indicate person spoken with, relationship, and if by phone, the number) Disposition Plan: pend clinical improvement    Consultants:  Urology, critical care   Procedures: 7/22 : s/p left percutaneous nephrostomy  7/17 CT abdo >>> stable hepatic cysts, cholelithiasis without inflammation, new severe left hydronephrosis concerning for UPJ stenosis.  7/21 CT abdo >>> stable left sided UPJ obstruction with severe hydro, cholelithiasis, stable hepatic cysts.  SIGNIFICANT EVENTS:  7/17 - initial presentation to [facility], discharged home.  7/21 - presented again to [facility] ED for same symptoms (abd pain)  7/22 - transferred to [facility] for urological evaluation,- moved to ICU for respiratory distress- hypotensive in setting of sepsis, treated w/ crystalloid resuscitation & neo infusion. Percutaneous nephrostomy tube placed, cloudy and bloody drainage. Trop I elevated, cards consulted for tachycardia.  7/23: pain resolved. Still hypotensive but weaning pressors.    Antibiotics:  Aztreonam Vancomycin 7-22 <<<7/25 Levaquin 7-22 <<<7/25 Aztreonam 7/22<<<7/25 Ceftriaxone 7/25<<<<<    (indicate start date, and stop date if known)  HPI/Subjective: alert  Objective: Filed Vitals:   08/01/13 0445  BP: 125/81  Pulse: 90  Temp: 98.4 F (36.9 C)  Resp: 20    Intake/Output Summary (Last 24 hours) at 08/01/13 1203 Last data filed at 08/01/13 0900  Gross per 24 hour  Intake    780 ml  Output   2540 ml  Net  -1760 ml   Filed Weights   07/25/13 1132 07/26/13 0500 07/30/13  0400  Weight: 46.409 kg (102 lb 5 oz) 46.3 kg (102 lb 1.2 oz) 51.1 kg (112 lb 10.5 oz)    Exam:   General:  alert  Cardiovascular: s1,s2 rrr  Respiratory: diminished AE LL  Abdomen:  soft, nt,nd   Musculoskeletal: no LE edema   Data Reviewed: Basic Metabolic Panel:  Recent Labs Lab 07/26/13 1830 07/27/13 0535 07/28/13 0345 07/29/13 0602 07/30/13 0358  NA 138 135* 136* 136* 137  K 2.9* 3.5* 3.4* 3.0* 3.4*  CL 102 101 106 104 103  CO2 22 18* 19 23 25   GLUCOSE 85 177* 96 89 92  BUN 30* 27* 26* 23 19  CREATININE 1.33* 1.21* 0.93 0.82 0.80  CALCIUM 7.7* 7.8* 7.6* 7.9* 7.8*  MG  --  2.4  --   --   --   PHOS  --  2.1*  --   --   --    Liver Function Tests:  Recent Labs Lab 07/25/13 1225 07/26/13 0505 07/27/13 0535  AST 79* 40* 62*  ALT 62* 48* 49*  ALKPHOS 63 76 174*  BILITOT 0.4 0.3 0.7  PROT 5.9* 5.3* 4.9*  ALBUMIN 2.6* 2.2* 1.8*    Recent Labs Lab 07/25/13 1225  LIPASE 12   No results found for this basename: AMMONIA,  in the last 168 hours CBC:  Recent Labs Lab 07/25/13 1225  07/27/13 0535 07/28/13 0345 07/28/13 0817 07/29/13 0602 07/30/13 0358 08/01/13 0006  WBC 13.9*  < > 20.4* 14.7*  --  16.2* 15.0* 10.1  NEUTROABS 13.3*  --   --   --   --   --   --   --   HGB 12.3  < > 11.2* 11.3*  --  12.3 11.6* 10.5*  HCT 35.9*  < > 33.1* 33.6*  --  36.4 33.5* 30.7*  MCV 94.2  < > 94.8 94.9  --  93.8 92.0 93.6  PLT 88*  < > 42* 21* 17* 39* 55* 127*  < > = values in this interval not displayed. Cardiac Enzymes:  Recent Labs Lab 07/25/13 2333 07/26/13 0505 07/26/13 1752 07/26/13 1830 07/27/13 0114 07/27/13 0540  CKTOTAL  --   --   --  63  --   --   CKMB  --   --   --  3.0  --   --   TROPONINI <0.30 <0.30 0.75*  --  0.39* <0.30   BNP (last 3 results) No results found for this basename: PROBNP,  in the last 8760 hours CBG: No results found for this basename: GLUCAP,  in the last 168 hours  Recent Results (from the past 240 hour(s))  MRSA PCR SCREENING     Status: None   Collection Time    07/25/13  6:41 PM      Result Value Ref Range Status   MRSA by PCR NEGATIVE  NEGATIVE Final   Comment:            The GeneXpert MRSA Assay  (FDA     approved for NASAL specimens     only), is one component of a     comprehensive MRSA colonization     surveillance program. It is not     intended to diagnose MRSA     infection nor to guide or     monitor treatment for     MRSA infections.  URINE CULTURE     Status: None   Collection Time    07/26/13  4:00 PM  Result Value Ref Range Status   Specimen Description URINE, CLEAN CATCH   Final   Special Requests NONE   Final   Culture  Setup Time     Final   Value: 07/26/2013 19:19     Performed at [place]     Final   Value: >=100,000 COLONIES/ML     Performed at [facility]   Culture     Final   Value: PROTEUS MIRABILIS     Performed at [facility]   Report Status 07/29/2013 FINAL   Final   Organism ID, Bacteria PROTEUS MIRABILIS   Final  URINE CULTURE     Status: None   Collection Time    07/26/13  4:00 PM      Result Value Ref Range Status   Specimen Description URINE, CLEAN CATCH   Final   Special Requests NONE   Final   Culture  Setup Time     Final   Value: 07/27/2013 04:47     Performed at [place]     Final   Value: >=100,000 COLONIES/ML     Performed at [facility]   Culture     Final   Value: PROTEUS MIRABILIS     Performed at [facility]   Report Status 07/29/2013 FINAL   Final   Organism ID, Bacteria PROTEUS MIRABILIS   Final  CULTURE, BLOOD (ROUTINE X 2)     Status: None   Collection Time    07/26/13  5:52 PM      Result Value Ref Range Status   Specimen Description BLOOD LEFT HAND   Final   Special Requests BOTTLES DRAWN AEROBIC AND ANAEROBIC 10 CC   Final   Culture  Setup Time     Final   Value: 07/26/2013 22:01     Performed at [facility]   Culture     Final   Value: PROTEUS MIRABILIS     Note: SUSCEPTIBILITIES PERFORMED ON PREVIOUS CULTURE WITHIN THE LAST 5 DAYS.     ENTEROCOCCUS SPECIES     Note: SUSCEPTIBILITIES PERFORMED ON PREVIOUS  CULTURE WITHIN LAST 5 DAYS     Note: Gram Stain Report Called to,Read Back By and Verified With: STEPHANIE D@10 :34AM ON 07/27/13 BY DANTS   Report Status 08/01/2013 FINAL   Final  CULTURE, BLOOD (ROUTINE X 2)     Status: None   Collection Time    07/26/13  5:57 PM      Result Value Ref Range Status   Specimen Description BLOOD LEFT ARM   Final   Special Requests BOTTLES DRAWN AEROBIC AND ANAEROBIC 10 CC   Final   Culture  Setup Time     Final   Value: 07/26/2013 22:01     Performed at [facility]   Culture     Final   Value: PROTEUS MIRABILIS     ENTEROCOCCUS SPECIES     Note: COMBINATION THERAPY OF HIGH DOSE AMPICILLIN OR VANCOMYCIN, PLUS AN AMINOGLYCOSIDE, IS USUALLY INDICATED FOR SERIOUS ENTEROCOCCAL INFECTIONS.     Note: Gram Stain Report Called to,Read Back By and Verified With: STEPHANIE D@10 :34AM ON 07/27/13 BY DANTS     Performed at [facility]   Report Status 08/01/2013 FINAL   Final   Organism ID, Bacteria PROTEUS MIRABILIS   Final   Organism ID, Bacteria ENTEROCOCCUS SPECIES   Final  URINE CULTURE  Status: None   Collection Time    07/26/13 10:34 PM      Result Value Ref Range Status   Specimen Description URINE, RANDOM DIRECT ASPIRATE FROM KIDNEY   Final   Special Requests NONE   Final   Culture  Setup Time     Final   Value: 07/27/2013 04:48     Performed at [place]     Final   Value: >=100,000 COLONIES/ML     Performed at [facility]   Culture     Final   Value: PROTEUS MIRABILIS     Performed at [facility]   Report Status 07/29/2013 FINAL   Final   Organism ID, Bacteria PROTEUS MIRABILIS   Final  CLOSTRIDIUM DIFFICILE BY PCR     Status: None   Collection Time    07/28/13  1:51 PM      Result Value Ref Range Status   C difficile by pcr NEGATIVE  NEGATIVE Final   Comment: Performed at [place], BLOOD (ROUTINE X 2)     Status: None   Collection Time    07/29/13  9:24 AM       Result Value Ref Range Status   Specimen Description BLOOD RIGHT HAND   Final   Special Requests BOTTLES DRAWN AEROBIC ONLY 2CC   Final   Culture  Setup Time     Final   Value: 07/29/2013 14:07     Performed at [facility]   Culture     Final   Value: ENTEROCOCCUS SPECIES     Note: SUSCEPTIBILITIES PERFORMED ON PREVIOUS CULTURE WITHIN THE LAST 5 DAYS.     Note: Gram Stain Report Called to,Read Back By and Verified With: Sharyon Medicus 07/30/13 @ 1:26PM BY RUSCOE A.     Performed at [facility]   Report Status 08/01/2013 FINAL   Final  CULTURE, BLOOD (ROUTINE X 2)     Status: None   Collection Time    07/29/13  9:39 AM      Result Value Ref Range Status   Specimen Description BLOOD LEFT HAND   Final   Special Requests BOTTLES DRAWN AEROBIC ONLY 2CC   Final   Culture  Setup Time     Final   Value: 07/29/2013 14:07     Performed at [facility]   Culture     Final   Value: ENTEROCOCCUS SPECIES     Note: SUSCEPTIBILITIES PERFORMED ON PREVIOUS CULTURE WITHIN THE LAST 5 DAYS.     Note: Gram Stain Report Called to,Read Back By and Verified With: JUNIOR RIMANDO 07/30/13 @ 7:30PM BY RUSCOE A.     Performed at [facility]   Report Status 08/01/2013 FINAL   Final  CULTURE, BLOOD (ROUTINE X 2)     Status: None   Collection Time    07/30/13  4:15 PM      Result Value Ref Range Status   Specimen Description BLOOD RIGHT ARM   Final   Special Requests BOTTLES DRAWN AEROBIC AND ANAEROBIC 6 CC   Final   Culture  Setup Time     Final   Value: 07/30/2013 22:40     Performed at [facility]   Culture     Final   Value:        BLOOD CULTURE RECEIVED NO GROWTH TO DATE CULTURE WILL BE HELD FOR 5 DAYS BEFORE ISSUING A FINAL NEGATIVE  REPORT     Performed at [facility]   Report Status PENDING   Incomplete     Studies: No results found.  Scheduled Meds: . antiseptic oral rinse  15 mL Mouth Rinse BID  . arformoterol  15 mcg Nebulization BID  .  atorvastatin  80 mg Oral q1800  . budesonide  3 mg Oral BID  . budesonide (PULMICORT) nebulizer solution  0.5 mg Nebulization BID  . cefTRIAXone (ROCEPHIN)  IV  1 g Intravenous Q24H  . citalopram  10 mg Oral Daily  . furosemide  20 mg Oral Q48H  . levETIRAcetam  250 mg Oral BID  . lidocaine  1 patch Transdermal Q24H  . metoprolol tartrate  12.5 mg Oral BID  . nystatin  5 mL Oral QID  . pantoprazole  40 mg Oral QAC breakfast  . potassium chloride  40 mEq Oral BID  . sodium chloride  3 mL Intravenous Q12H  . tiotropium  18 mcg Inhalation Daily   Continuous Infusions: . sodium chloride 10 mL/hr at 07/28/13 1810    Principal Problem:   AKI (acute kidney injury) Active Problems:   Diarrhea   Abdominal pain, unspecified site   Hydronephrosis, left   Dehydration   Elevated troponin    Time spent: >35 minutes     Kinnie Feil  [facility] Pager [phone]. If 7PM-7AM, please contact night-coverage at [email or web address], password [facility] 08/01/2013, 12:03 PM  LOS: 7 days             D/w patient, family; They would like to try morphine for pain control; no allergy in the past, but some sedation; will watch   Glendell Fouse

## 2013-08-01 NOTE — Progress Notes (Signed)
ANTIBIOTIC CONSULT NOTE - INITIAL  Pharmacy Consult for Vancomycin Indication: bacteremia  Allergies  Allergen Reactions  . Penicillins Anaphylaxis    Throat swelling as a child. Tolerated ceftriaxone 07/29/13.  . Dilaudid [Hydromorphone Hcl]     Severe sedation per son    Patient Measurements: Height: 5\' 1"  (154.9 cm) Weight: 112 lb 10.5 oz (51.1 kg) IBW/kg (Calculated) : 47.8  Vital Signs: Temp: 98.1 F (36.7 C) (07/28 1356) Temp src: Oral (07/28 1356) BP: 108/53 mmHg (07/28 1356) Pulse Rate: 87 (07/28 1356) Intake/Output from previous day: 07/27 0701 - 07/28 0700 In: 420 [P.O.:360; IV Piggyback:50] Out: 6073 [XTGGY:6948] Intake/Output from this shift: Total I/O In: 720 [P.O.:720] Out: 1000 [Urine:1000]  Labs:  Recent Labs  07/30/13 0358 08/01/13 0006  WBC 15.0* 10.1  HGB 11.6* 10.5*  PLT 55* 127*  CREATININE 0.80  --    Estimated Creatinine Clearance: 40.9 ml/min (by C-G formula based on Cr of 0.8).  Recent Labs  07/29/13 1700  VANCOTROUGH 6.0*     Microbiology: Recent Results (from the past 720 hour(s))  CLOSTRIDIUM DIFFICILE BY PCR     Status: None   Collection Time    07/09/13  2:07 PM      Result Value Ref Range Status   C difficile by pcr NEGATIVE  NEGATIVE Final  MRSA PCR SCREENING     Status: None   Collection Time    07/25/13  6:41 PM      Result Value Ref Range Status   MRSA by PCR NEGATIVE  NEGATIVE Final   Comment:            The GeneXpert MRSA Assay (FDA     approved for NASAL specimens     only), is one component of a     comprehensive MRSA colonization     surveillance program. It is not     intended to diagnose MRSA     infection nor to guide or     monitor treatment for     MRSA infections.  URINE CULTURE     Status: None   Collection Time    07/26/13  4:00 PM      Result Value Ref Range Status   Specimen Description URINE, CLEAN CATCH   Final   Special Requests NONE   Final   Culture  Setup Time     Final   Value:  07/26/2013 19:19     Performed at [place]     Final   Value: >=100,000 COLONIES/ML     Performed at [facility]   Culture     Final   Value: PROTEUS MIRABILIS     Performed at [facility]   Report Status 07/29/2013 FINAL   Final   Organism ID, Bacteria PROTEUS MIRABILIS   Final  URINE CULTURE     Status: None   Collection Time    07/26/13  4:00 PM      Result Value Ref Range Status   Specimen Description URINE, CLEAN CATCH   Final   Special Requests NONE   Final   Culture  Setup Time     Final   Value: 07/27/2013 04:47     Performed at [place]     Final   Value: >=100,000 COLONIES/ML     Performed at [facility]   Culture     Final   Value: [place]  Performed at [facility]   Report Status 07/29/2013 FINAL   Final   Organism ID, Bacteria PROTEUS MIRABILIS   Final  CULTURE, BLOOD (ROUTINE X 2)     Status: None   Collection Time    07/26/13  5:52 PM      Result Value Ref Range Status   Specimen Description BLOOD LEFT HAND   Final   Special Requests BOTTLES DRAWN AEROBIC AND ANAEROBIC 10 CC   Final   Culture  Setup Time     Final   Value: 07/26/2013 22:01     Performed at [facility]   Culture     Final   Value: PROTEUS MIRABILIS     Note: SUSCEPTIBILITIES PERFORMED ON PREVIOUS CULTURE WITHIN THE LAST 5 DAYS.     ENTEROCOCCUS SPECIES     Note: SUSCEPTIBILITIES PERFORMED ON PREVIOUS CULTURE WITHIN LAST 5 DAYS     Note: Gram Stain Report Called to,Read Back By and Verified With: STEPHANIE D@10 :34AM ON 07/27/13 BY DANTS   Report Status 08/01/2013 FINAL   Final  CULTURE, BLOOD (ROUTINE X 2)     Status: None   Collection Time    07/26/13  5:57 PM      Result Value Ref Range Status   Specimen Description BLOOD LEFT ARM   Final   Special Requests BOTTLES DRAWN AEROBIC AND ANAEROBIC 10 CC   Final   Culture  Setup Time     Final   Value: 07/26/2013 22:01      Performed at [facility]   Culture     Final   Value: PROTEUS MIRABILIS     ENTEROCOCCUS SPECIES     Note: COMBINATION THERAPY OF HIGH DOSE AMPICILLIN OR VANCOMYCIN, PLUS AN AMINOGLYCOSIDE, IS USUALLY INDICATED FOR SERIOUS ENTEROCOCCAL INFECTIONS.     Note: Gram Stain Report Called to,Read Back By and Verified With: STEPHANIE D@10 :34AM ON 07/27/13 BY DANTS     Performed at [facility]   Report Status 08/01/2013 FINAL   Final   Organism ID, Bacteria PROTEUS MIRABILIS   Final   Organism ID, Bacteria ENTEROCOCCUS SPECIES   Final  URINE CULTURE     Status: None   Collection Time    07/26/13 10:34 PM      Result Value Ref Range Status   Specimen Description URINE, RANDOM DIRECT ASPIRATE FROM KIDNEY   Final   Special Requests NONE   Final   Culture  Setup Time     Final   Value: 07/27/2013 04:48     Performed at [place]     Final   Value: >=100,000 COLONIES/ML     Performed at [facility]   Culture     Final   Value: PROTEUS MIRABILIS     Performed at [facility]   Report Status 07/29/2013 FINAL   Final   Organism ID, Bacteria PROTEUS MIRABILIS   Final  CLOSTRIDIUM DIFFICILE BY PCR     Status: None   Collection Time    07/28/13  1:51 PM      Result Value Ref Range Status   C difficile by pcr NEGATIVE  NEGATIVE Final   Comment: Performed at [place], BLOOD (ROUTINE X 2)     Status: None   Collection Time    07/29/13  9:24 AM      Result Value Ref Range Status   Specimen Description BLOOD RIGHT HAND   Final  Special Requests BOTTLES DRAWN AEROBIC ONLY 2CC   Final   Culture  Setup Time     Final   Value: 07/29/2013 14:07     Performed at [facility]   Culture     Final   Value: ENTEROCOCCUS SPECIES     Note: SUSCEPTIBILITIES PERFORMED ON PREVIOUS CULTURE WITHIN THE LAST 5 DAYS.     Note: Gram Stain Report Called to,Read Back By and Verified With: Sharyon Medicus 07/30/13 @ 1:26PM BY RUSCOE A.      Performed at [facility]   Report Status 08/01/2013 FINAL   Final  CULTURE, BLOOD (ROUTINE X 2)     Status: None   Collection Time    07/29/13  9:39 AM      Result Value Ref Range Status   Specimen Description BLOOD LEFT HAND   Final   Special Requests BOTTLES DRAWN AEROBIC ONLY 2CC   Final   Culture  Setup Time     Final   Value: 07/29/2013 14:07     Performed at [facility]   Culture     Final   Value: ENTEROCOCCUS SPECIES     Note: SUSCEPTIBILITIES PERFORMED ON PREVIOUS CULTURE WITHIN THE LAST 5 DAYS.     Note: Gram Stain Report Called to,Read Back By and Verified With: JUNIOR RIMANDO 07/30/13 @ 7:30PM BY RUSCOE A.     Performed at [facility]   Report Status 08/01/2013 FINAL   Final  CULTURE, BLOOD (ROUTINE X 2)     Status: None   Collection Time    07/30/13  4:15 PM      Result Value Ref Range Status   Specimen Description BLOOD RIGHT ARM   Final   Special Requests BOTTLES DRAWN AEROBIC AND ANAEROBIC 6 CC   Final   Culture  Setup Time     Final   Value: 07/30/2013 22:40     Performed at [facility]   Culture     Final   Value:        BLOOD CULTURE RECEIVED NO GROWTH TO DATE CULTURE WILL BE HELD FOR 5 DAYS BEFORE ISSUING A FINAL NEGATIVE REPORT     Performed at [facility]   Report Status PENDING   Incomplete    Medical History: Past Medical History  Diagnosis Date  . COPD (chronic obstructive pulmonary disease)   . Arthritis   . Stroke     TIA four years ago  . Depression   . Seizures     x 2 last year due to bleeding to the brain.  . Memory difficulties   . Chronic diarrhea   . Hiatal hernia   . Cancer     Skin cancer    Medications:  Scheduled:  . antiseptic oral rinse  15 mL Mouth Rinse BID  . arformoterol  15 mcg Nebulization BID  . atorvastatin  80 mg Oral q1800  . budesonide  3 mg Oral BID  . budesonide (PULMICORT) nebulizer solution  0.5 mg Nebulization BID  . cefTRIAXone (ROCEPHIN)  IV  1 g  Intravenous Q24H  . citalopram  10 mg Oral Daily  . furosemide  20 mg Oral Q48H  . heparin subcutaneous  5,000 Units Subcutaneous 3 times per day  . levETIRAcetam  250 mg Oral BID  . lidocaine  1 patch Transdermal Q24H  . metoprolol tartrate  12.5 mg Oral BID  . nystatin  5 mL Oral QID  . pantoprazole  40  mg Oral QAC breakfast  . potassium chloride  40 mEq Oral BID  . sodium chloride  3 mL Intravenous Q12H  . tiotropium  18 mcg Inhalation Daily  . vancomycin  750 mg Intravenous Q24H   Infusions:  . sodium chloride 10 mL/hr at 07/28/13 1810   PRN: acetaminophen, acetaminophen, albuterol, ketorolac, LORazepam, morphine injection, ondansetron (ZOFRAN) IV, ondansetron Assessment: 80 y/o female with PMH of advanced COPD, smoker, anxiety, microscopic colitis, h/o SAH/seizure presented to [facility] On 7/21 w/ cc: abdominal pain, diarrhea, poor oral intake and diagnosed with left hydro. Transferred to [facility] for consideration of perc drainage. On 7/22, She was transferred to ICU increased resp distress, hypotension. Pharmacy consulted to dose Vancomycin for positive blood cultures (enterococcus).  7/22 >> Vanc >> 7/23 7/23 >> Vanc >> 7/25 7/22 >> Aztreonam >> 7/25 7/23 >> LVQ >> 7/25 7/25 >> Rocephin >> 7/28 >> Vanc >>  Tmax: afebrile WBCs: improved 10.1 Renal: SCr improved to 0.8, CrCl ~ 38 ml/min / N 60 (Baseline SCr ~ 0.7, but AKI w/ severe hydronephrosis on admission and SCr 1.7)  7/22 blood: Proteus (R to amp/bactrim), enterococcus (S to ampicillin & vanc) 7/22 urine (clean catch): > 100k Proteus 7/22 urine (direct aspirate from kidney): > 100k Proteus 7/22 MRSA: Negative 7/24 Cdiff: sent 7/25 blood: enterococcus 7/25 blood: enterococcus  Goal of Therapy:  Vancomycin trough level 15-20 mcg/ml Appropriate antibiotic dosing for renal function; eradication of infection   Plan:  Start Vancomycin 750mg  IV Q24H Measure antibiotic drug levels at steady state  Kizzie Furnish,  PharmD Pager: [phone] 08/01/2013 2:27 PM

## 2013-08-01 NOTE — Progress Notes (Signed)
  Subjective: Pt doing ok  Objective: Vital signs in last 24 hours: Temp:  [97.5 F (36.4 C)-98.4 F (36.9 C)] 98.4 F (36.9 C) (07/28 0445) Pulse Rate:  [79-90] 90 (07/28 0445) Resp:  [16-24] 20 (07/28 0445) BP: (105-125)/(55-81) 125/81 mmHg (07/28 0445) SpO2:  [95 %-100 %] 97 % (07/28 0803) Last BM Date: 07/31/13  Left PCN intact, dressing dry, output 390 cc's yellow urine  Lab Results:   Recent Labs  07/30/13 0358 08/01/13 0006  WBC 15.0* 10.1  HGB 11.6* 10.5*  HCT 33.5* 30.7*  PLT 55* 127*   BMET  Recent Labs  07/30/13 0358  NA 137  K 3.4*  CL 103  CO2 25  GLUCOSE 92  BUN 19  CREATININE 0.80  CALCIUM 7.8*    Anti-infectives: Anti-infectives   Start     Dose/Rate Route Frequency Ordered Stop   07/29/13 2100  cefTRIAXone (ROCEPHIN) 1 g in dextrose 5 % 50 mL IVPB     1 g 100 mL/hr over 30 Minutes Intravenous Every 24 hours 07/29/13 2022     07/29/13 1800  cefTRIAXone (ROCEPHIN) 100 mg in dextrose 5 % 50 mL IVPB     100 mg 100 mL/hr over 30 Minutes Intravenous  Once 07/29/13 1729 07/29/13 1825   07/29/13 1000  levofloxacin (LEVAQUIN) IVPB 750 mg  Status:  Discontinued     750 mg 100 mL/hr over 90 Minutes Intravenous Every 48 hours 07/29/13 0811 07/29/13 1731   07/29/13 0800  levofloxacin (LEVAQUIN) IVPB 500 mg  Status:  Discontinued     500 mg 100 mL/hr over 60 Minutes Intravenous Every 48 hours 07/27/13 0748 07/29/13 0811   07/27/13 1800  vancomycin (VANCOCIN) 500 mg in sodium chloride 0.9 % 100 mL IVPB  Status:  Discontinued     500 mg 100 mL/hr over 60 Minutes Intravenous Every 24 hours 07/27/13 1417 07/29/13 1731   07/27/13 0900  levofloxacin (LEVAQUIN) IVPB 750 mg     750 mg 100 mL/hr over 90 Minutes Intravenous  Once 07/27/13 0748 07/27/13 1055   07/27/13 0600  aztreonam (AZACTAM) 1 g in dextrose 5 % 50 mL IVPB  Status:  Discontinued     1 g 100 mL/hr over 30 Minutes Intravenous 3 times per day 07/26/13 1938 07/29/13 1731   07/27/13 0200   metroNIDAZOLE (FLAGYL) IVPB 500 mg  Status:  Discontinued     500 mg 100 mL/hr over 60 Minutes Intravenous Every 8 hours 07/26/13 1822 07/26/13 1922   07/26/13 1945  aztreonam (AZACTAM) 2 g in dextrose 5 % 50 mL IVPB     2 g 100 mL/hr over 30 Minutes Intravenous NOW 07/26/13 1932 07/26/13 2148   07/26/13 1830  vancomycin (VANCOCIN) 500 mg in sodium chloride 0.9 % 100 mL IVPB  Status:  Discontinued     500 mg 100 mL/hr over 60 Minutes Intravenous Every 24 hours 07/26/13 1817 07/27/13 1038   07/26/13 1830  metroNIDAZOLE (FLAGYL) IVPB 500 mg  Status:  Discontinued     500 mg 100 mL/hr over 60 Minutes Intravenous  Once 07/26/13 1822 07/26/13 1922   07/26/13 1830  levofloxacin (LEVAQUIN) IVPB 750 mg  Status:  Discontinued     750 mg 100 mL/hr over 90 Minutes Intravenous Every 48 hours 07/26/13 1823 07/26/13 1930      Assessment/Plan: s/p left PCN 7/22; cont current tx. No new recs per Urology   LOS: 7 days    Bridget Ball 08/01/2013

## 2013-08-02 LAB — BASIC METABOLIC PANEL
ANION GAP: 11 (ref 5–15)
BUN: 16 mg/dL (ref 6–23)
CHLORIDE: 100 meq/L (ref 96–112)
CO2: 23 meq/L (ref 19–32)
Calcium: 8.9 mg/dL (ref 8.4–10.5)
Creatinine, Ser: 0.75 mg/dL (ref 0.50–1.10)
GFR calc Af Amer: 89 mL/min — ABNORMAL LOW (ref >90)
GFR calc non Af Amer: 77 mL/min — ABNORMAL LOW (ref >90)
Glucose, Bld: 97 mg/dL (ref 70–99)
POTASSIUM: 4.7 meq/L (ref 3.7–5.3)
SODIUM: 134 meq/L — AB (ref 137–147)

## 2013-08-02 LAB — CBC
HCT: 34.1 % — ABNORMAL LOW (ref 36.0–46.0)
Hemoglobin: 11.6 g/dL — ABNORMAL LOW (ref 12.0–15.0)
MCH: 32.3 pg (ref 26.0–34.0)
MCHC: 34 g/dL (ref 30.0–36.0)
MCV: 95 fL (ref 78.0–100.0)
PLATELETS: 186 10*3/uL (ref 150–400)
RBC: 3.59 MIL/uL — AB (ref 3.87–5.11)
RDW: 15.1 % (ref 11.5–15.5)
WBC: 14.8 10*3/uL — AB (ref 4.0–10.5)

## 2013-08-02 MED ORDER — SODIUM CHLORIDE 0.9 % IJ SOLN
10.0000 mL | INTRAMUSCULAR | Status: DC | PRN
  Administered 2013-08-03: 10 mL

## 2013-08-02 MED ORDER — ENSURE COMPLETE PO LIQD
237.0000 mL | Freq: Three times a day (TID) | ORAL | Status: DC
  Administered 2013-08-02 – 2013-08-03 (×3): 237 mL via ORAL

## 2013-08-02 NOTE — Progress Notes (Signed)
INFECTIOUS DISEASE PROGRESS NOTE  ID: Bridget Ball is a 80 y.o. female with  Principal Problem:   AKI (acute kidney injury) Active Problems:   Diarrhea   Abdominal pain, unspecified site   Hydronephrosis, left   Dehydration   Elevated troponin  Subjective: Without complaints  Abtx:  Anti-infectives   Start     Dose/Rate Route Frequency Ordered Stop   08/01/13 1600  vancomycin (VANCOCIN) IVPB 750 mg/150 ml premix     750 mg 150 mL/hr over 60 Minutes Intravenous Every 24 hours 08/01/13 1422     07/29/13 2100  cefTRIAXone (ROCEPHIN) 1 g in dextrose 5 % 50 mL IVPB     1 g 100 mL/hr over 30 Minutes Intravenous Every 24 hours 07/29/13 2022     07/29/13 1800  cefTRIAXone (ROCEPHIN) 100 mg in dextrose 5 % 50 mL IVPB     100 mg 100 mL/hr over 30 Minutes Intravenous  Once 07/29/13 1729 07/29/13 1825   07/29/13 1000  levofloxacin (LEVAQUIN) IVPB 750 mg  Status:  Discontinued     750 mg 100 mL/hr over 90 Minutes Intravenous Every 48 hours 07/29/13 0811 07/29/13 1731   07/29/13 0800  levofloxacin (LEVAQUIN) IVPB 500 mg  Status:  Discontinued     500 mg 100 mL/hr over 60 Minutes Intravenous Every 48 hours 07/27/13 0748 07/29/13 0811   07/27/13 1800  vancomycin (VANCOCIN) 500 mg in sodium chloride 0.9 % 100 mL IVPB  Status:  Discontinued     500 mg 100 mL/hr over 60 Minutes Intravenous Every 24 hours 07/27/13 1417 07/29/13 1731   07/27/13 0900  levofloxacin (LEVAQUIN) IVPB 750 mg     750 mg 100 mL/hr over 90 Minutes Intravenous  Once 07/27/13 0748 07/27/13 1055   07/27/13 0600  aztreonam (AZACTAM) 1 g in dextrose 5 % 50 mL IVPB  Status:  Discontinued     1 g 100 mL/hr over 30 Minutes Intravenous 3 times per day 07/26/13 1938 07/29/13 1731   07/27/13 0200  metroNIDAZOLE (FLAGYL) IVPB 500 mg  Status:  Discontinued     500 mg 100 mL/hr over 60 Minutes Intravenous Every 8 hours 07/26/13 1822 07/26/13 1922   07/26/13 1945  aztreonam (AZACTAM) 2 g in dextrose 5 % 50 mL IVPB     2  g 100 mL/hr over 30 Minutes Intravenous NOW 07/26/13 1932 07/26/13 2148   07/26/13 1830  vancomycin (VANCOCIN) 500 mg in sodium chloride 0.9 % 100 mL IVPB  Status:  Discontinued     500 mg 100 mL/hr over 60 Minutes Intravenous Every 24 hours 07/26/13 1817 07/27/13 1038   07/26/13 1830  metroNIDAZOLE (FLAGYL) IVPB 500 mg  Status:  Discontinued     500 mg 100 mL/hr over 60 Minutes Intravenous  Once 07/26/13 1822 07/26/13 1922   07/26/13 1830  levofloxacin (LEVAQUIN) IVPB 750 mg  Status:  Discontinued     750 mg 100 mL/hr over 90 Minutes Intravenous Every 48 hours 07/26/13 1823 07/26/13 1930      Medications:  Scheduled: . antiseptic oral rinse  15 mL Mouth Rinse BID  . arformoterol  15 mcg Nebulization BID  . atorvastatin  80 mg Oral q1800  . budesonide  3 mg Oral BID  . budesonide (PULMICORT) nebulizer solution  0.5 mg Nebulization BID  . cefTRIAXone (ROCEPHIN)  IV  1 g Intravenous Q24H  . citalopram  10 mg Oral Daily  . furosemide  20 mg Oral Q48H  . heparin subcutaneous  5,000 Units  Subcutaneous 3 times per day  . levETIRAcetam  250 mg Oral BID  . lidocaine  1 patch Transdermal Q24H  . metoprolol tartrate  12.5 mg Oral BID  . nystatin  5 mL Oral QID  . pantoprazole  40 mg Oral QAC breakfast  . potassium chloride  40 mEq Oral BID  . sodium chloride  3 mL Intravenous Q12H  . tiotropium  18 mcg Inhalation Daily  . vancomycin  750 mg Intravenous Q24H    Objective: Vital signs in last 24 hours: Temp:  [97.4 F (36.3 C)-98.7 F (37.1 C)] 97.4 F (36.3 C) (07/29 1334) Pulse Rate:  [65-95] 65 (07/29 1334) Resp:  [20-24] 20 (07/29 1334) BP: (97-113)/(56-69) 97/56 mmHg (07/29 1334) SpO2:  [95 %-99 %] 98 % (07/29 1334)   General appearance: alert, cooperative and no distress Resp: clear to auscultation bilaterally Cardio: regular rate and rhythm GI: normal findings: bowel sounds normal and soft, non-tender  Lab Results  Recent Labs  08/01/13 0006 08/02/13 0438  WBC  10.1 14.8*  HGB 10.5* 11.6*  HCT 30.7* 34.1*  NA  --  134*  K  --  4.7  CL  --  100  CO2  --  23  BUN  --  16  CREATININE  --  0.75   Liver Panel No results found for this basename: PROT, ALBUMIN, AST, ALT, ALKPHOS, BILITOT, BILIDIR, IBILI,  in the last 72 hours Sedimentation Rate No results found for this basename: ESRSEDRATE,  in the last 72 hours C-Reactive Protein No results found for this basename: CRP,  in the last 72 hours  Microbiology: Recent Results (from the past 240 hour(s))  MRSA PCR SCREENING     Status: None   Collection Time    07/25/13  6:41 PM      Result Value Ref Range Status   MRSA by PCR NEGATIVE  NEGATIVE Final   Comment:            The GeneXpert MRSA Assay (FDA     approved for NASAL specimens     only), is one component of a     comprehensive MRSA colonization     surveillance program. It is not     intended to diagnose MRSA     infection nor to guide or     monitor treatment for     MRSA infections.  URINE CULTURE     Status: None   Collection Time    07/26/13  4:00 PM      Result Value Ref Range Status   Specimen Description URINE, CLEAN CATCH   Final   Special Requests NONE   Final   Culture  Setup Time     Final   Value: 07/26/2013 19:19     Performed at [place]     Final   Value: >=100,000 COLONIES/ML     Performed at [facility]   Culture     Final   Value: PROTEUS MIRABILIS     Performed at [facility]   Report Status 07/29/2013 FINAL   Final   Organism ID, Bacteria PROTEUS MIRABILIS   Final  URINE CULTURE     Status: None   Collection Time    07/26/13  4:00 PM      Result Value Ref Range Status   Specimen Description URINE, CLEAN CATCH   Final   Special Requests NONE   Final   Culture  Setup Time  Final   Value: 07/27/2013 04:47     Performed at [place]     Final   Value: >=100,000 COLONIES/ML     Performed at [facility]   Culture      Final   Value: PROTEUS MIRABILIS     Performed at [facility]   Report Status 07/29/2013 FINAL   Final   Organism ID, Bacteria PROTEUS MIRABILIS   Final  CULTURE, BLOOD (ROUTINE X 2)     Status: None   Collection Time    07/26/13  5:52 PM      Result Value Ref Range Status   Specimen Description BLOOD LEFT HAND   Final   Special Requests BOTTLES DRAWN AEROBIC AND ANAEROBIC 10 CC   Final   Culture  Setup Time     Final   Value: 07/26/2013 22:01     Performed at [facility]   Culture     Final   Value: PROTEUS MIRABILIS     Note: SUSCEPTIBILITIES PERFORMED ON PREVIOUS CULTURE WITHIN THE LAST 5 DAYS.     ENTEROCOCCUS SPECIES     Note: SUSCEPTIBILITIES PERFORMED ON PREVIOUS CULTURE WITHIN LAST 5 DAYS     Note: Gram Stain Report Called to,Read Back By and Verified With: STEPHANIE D@10 :34AM ON 07/27/13 BY DANTS   Report Status 08/01/2013 FINAL   Final  CULTURE, BLOOD (ROUTINE X 2)     Status: None   Collection Time    07/26/13  5:57 PM      Result Value Ref Range Status   Specimen Description BLOOD LEFT ARM   Final   Special Requests BOTTLES DRAWN AEROBIC AND ANAEROBIC 10 CC   Final   Culture  Setup Time     Final   Value: 07/26/2013 22:01     Performed at [facility]   Culture     Final   Value: PROTEUS MIRABILIS     ENTEROCOCCUS SPECIES     Note: COMBINATION THERAPY OF HIGH DOSE AMPICILLIN OR VANCOMYCIN, PLUS AN AMINOGLYCOSIDE, IS USUALLY INDICATED FOR SERIOUS ENTEROCOCCAL INFECTIONS.     Note: Gram Stain Report Called to,Read Back By and Verified With: STEPHANIE D@10 :34AM ON 07/27/13 BY DANTS     Performed at [facility]   Report Status 08/01/2013 FINAL   Final   Organism ID, Bacteria PROTEUS MIRABILIS   Final   Organism ID, Bacteria ENTEROCOCCUS SPECIES   Final  URINE CULTURE     Status: None   Collection Time    07/26/13 10:34 PM      Result Value Ref Range Status   Specimen Description URINE, RANDOM DIRECT ASPIRATE FROM KIDNEY   Final    Special Requests NONE   Final   Culture  Setup Time     Final   Value: 07/27/2013 04:48     Performed at [place]     Final   Value: >=100,000 COLONIES/ML     Performed at [facility]   Culture     Final   Value: PROTEUS MIRABILIS     Performed at [facility]   Report Status 07/29/2013 FINAL   Final   Organism ID, Bacteria PROTEUS MIRABILIS   Final  CLOSTRIDIUM DIFFICILE BY PCR     Status: None   Collection Time    07/28/13  1:51 PM      Result Value Ref Range Status   C difficile by  pcr NEGATIVE  NEGATIVE Final   Comment: Performed at [place], BLOOD (ROUTINE X 2)     Status: None   Collection Time    07/29/13  9:24 AM      Result Value Ref Range Status   Specimen Description BLOOD RIGHT HAND   Final   Special Requests BOTTLES DRAWN AEROBIC ONLY 2CC   Final   Culture  Setup Time     Final   Value: 07/29/2013 14:07     Performed at [facility]   Culture     Final   Value: ENTEROCOCCUS SPECIES     Note: SUSCEPTIBILITIES PERFORMED ON PREVIOUS CULTURE WITHIN THE LAST 5 DAYS.     Note: Gram Stain Report Called to,Read Back By and Verified With: Sharyon Medicus 07/30/13 @ 1:26PM BY RUSCOE A.     Performed at [facility]   Report Status 08/01/2013 FINAL   Final  CULTURE, BLOOD (ROUTINE X 2)     Status: None   Collection Time    07/29/13  9:39 AM      Result Value Ref Range Status   Specimen Description BLOOD LEFT HAND   Final   Special Requests BOTTLES DRAWN AEROBIC ONLY 2CC   Final   Culture  Setup Time     Final   Value: 07/29/2013 14:07     Performed at [facility]   Culture     Final   Value: ENTEROCOCCUS SPECIES     Note: SUSCEPTIBILITIES PERFORMED ON PREVIOUS CULTURE WITHIN THE LAST 5 DAYS.     Note: Gram Stain Report Called to,Read Back By and Verified With: JUNIOR RIMANDO 07/30/13 @ 7:30PM BY RUSCOE A.     Performed at [facility]   Report Status 08/01/2013 FINAL   Final   CULTURE, BLOOD (ROUTINE X 2)     Status: None   Collection Time    07/30/13  4:15 PM      Result Value Ref Range Status   Specimen Description BLOOD RIGHT ARM   Final   Special Requests BOTTLES DRAWN AEROBIC AND ANAEROBIC 6 CC   Final   Culture  Setup Time     Final   Value: 07/30/2013 22:40     Performed at [facility]   Culture     Final   Value:        BLOOD CULTURE RECEIVED NO GROWTH TO DATE CULTURE WILL BE HELD FOR 5 DAYS BEFORE ISSUING A FINAL NEGATIVE REPORT     Performed at [facility]   Report Status PENDING   Incomplete  CULTURE, BLOOD (ROUTINE X 2)     Status: None   Collection Time    08/01/13 12:06 AM      Result Value Ref Range Status   Specimen Description BLOOD RIGHT ANTECUBITAL   Final   Special Requests BOTTLES DRAWN AEROBIC AND ANAEROBIC 10CC   Final   Culture  Setup Time     Final   Value: 08/01/2013 03:49     Performed at [facility]   Culture     Final   Value:        BLOOD CULTURE RECEIVED NO GROWTH TO DATE CULTURE WILL BE HELD FOR 5 DAYS BEFORE ISSUING A FINAL NEGATIVE REPORT     Performed at [facility]   Report Status PENDING   Incomplete    Studies/Results: No results found.   Assessment/Plan: Urosepsis  P mirabilis (I unsayn,  cipro; R-bactrim) in BCx and UCx  Enterococcal bacteremia 7-25  Hydronephrosis with percutaneous drain 7-22  Fluid overload (on CXR)  Chronic pancreatitis with stone  Chronic Diarrhea/Microscopic colits  PEN allergy  ARF  DNR/DNI   Total days of antibiotics: 7 ceftriaxone.     Day 2 vanco     7-22 vanco 7-25     7-28 vanco  Plan for 14 more days ceftriaxone Plan for 26 more days month vanco (due to persistence of enterococci while on anbx) Repeat BCx 7-26 and 7-28 are ngtd Will place PIC F/u BCx 1 week after finishing her anbx available if questions         Bobby Rumpf Infectious Diseases (pager) [phone] [email or web address] 08/02/2013, 3:12 PM  LOS: 8 days    **Disclaimer: This note may have been dictated with voice recognition software. Similar sounding words can inadvertently be transcribed and this note may contain transcription errors which may not have been corrected upon publication of note.**

## 2013-08-02 NOTE — Progress Notes (Signed)
Occupational Therapy Treatment Patient Details Name: Bridget Ball MRN: [id number] DOB: [birth year] Today's Date: 08/02/2013    History of present illness 80 yo female admitted with septic shock, acute resp failure. s/p L percutaneous nephrostomy 7/22. Pt was Ind prior to admission   OT comments  Daughter reports pt is doing better than she did yesterday.  Strengthening/balance as component of ADLs was goal this session as ADLs complete.  Follow Up Recommendations  SNF    Equipment Recommendations  None recommended by OT    Recommendations for Other Services      Precautions / Restrictions Precautions Precautions: Fall Restrictions Weight Bearing Restrictions: No       Mobility Bed Mobility                  Transfers   Equipment used: Rolling walker (2 wheeled) Transfers: Sit to/from Stand Sit to Stand: Min assist         General transfer comment: assist to rise, steady, and control descent.  Cues for UE placement    Balance Overall balance assessment: Needs assistance         Standing balance support: Bilateral upper extremity supported Standing balance-Leahy Scale: Poor Standing balance comment: pt loses balance posteriorly                   ADL                                         General ADL Comments: Pt had completed all adls and had just used commode.  Worked on unsupported sitting balance and sit to stand (twice) for strengthening/balance      Vision                     Perception     Praxis      Cognition   Behavior During Therapy: [facility] for tasks assessed/performed Overall Cognitive Status: Within Functional Limits for tasks assessed                       Extremity/Trunk Assessment               Exercises     Shoulder Instructions       General Comments      Pertinent Vitals/ Pain       Pt c/o pain in back of thighs.  Did not rate.  Repositioned back in chair  Home  Living                                          Prior Functioning/Environment              Frequency Min 2X/week     Progress Toward Goals  OT Goals(current goals can now be found in the care plan section)  Progress towards OT goals: Progressing toward goals     Plan Discharge plan remains appropriate    Co-evaluation                 End of Session     Activity Tolerance Patient limited by fatigue   Patient Left in chair;with call bell/phone within reach;with family/visitor present   Nurse Communication          Time: 7425-9563 OT Time Calculation (  min): 15 min  Charges: OT General Charges $OT Visit: 1 Procedure OT Treatments $Therapeutic Activity: 8-22 mins  Kimora Stankovic 08/02/2013, 12:32 PM   Lesle Chris, OTR/L [phone] 08/02/2013

## 2013-08-02 NOTE — Progress Notes (Signed)
Patient has a bed @ [facility] when stable. Patient & daughters at bedside aware.   Clinical Social Work Department CLINICAL SOCIAL WORK PLACEMENT NOTE 08/02/2013  Patient:  Bridget Ball, Bridget Ball  Account Number:  [id number] Admit date:  07/25/2013  Clinical Social Worker:  Renold Genta  Date/time:  08/02/2013 10:45 AM  Clinical Social Work is seeking post-discharge placement for this patient at the following level of care:   SKILLED NURSING   (*CSW will update this form in Epic as items are completed)   08/02/2013  Patient/family provided with [place] Department of Clinical Social Work's list of facilities offering this level of care within the geographic area requested by the patient (or if unable, by the patient's family).  08/02/2013  Patient/family informed of their freedom to choose among providers that offer the needed level of care, that participate in Medicare, Medicaid or managed care program needed by the patient, have an available bed and are willing to accept the patient.  08/02/2013  Patient/family informed of [facility]' ownership interest in [facility], as well as of the fact that they are under no obligation to receive care at this facility.  PASARR submitted to EDS on 08/02/2013 PASARR number received on 08/02/2013  FL2 transmitted to all facilities in geographic area requested by pt/family on  08/02/2013 FL2 transmitted to all facilities within larger geographic area on   Patient informed that his/her managed care company has contracts with or will negotiate with  certain facilities, including the following:     Patient/family informed of bed offers received:  08/02/2013 Patient chooses bed at [facility] Physician recommends and patient chooses bed at    Patient to be transferred to [facility] on   Patient to be transferred to facility by  Patient and family notified of transfer on  Name of family member notified:     The following physician request were entered in Epic:   Additional Comments:   Bridget Ball, [place] Social Worker cell #: [phone]

## 2013-08-02 NOTE — Progress Notes (Signed)
Clinical Social Work Department BRIEF PSYCHOSOCIAL ASSESSMENT 08/02/2013  Patient:  Bridget Ball, Bridget Ball     Account Number:  [id number]     Admit date:  07/25/2013  Clinical Social Worker:  Renold Genta  Date/Time:  08/02/2013 10:43 AM  Referred by:  Physician  Date Referred:  08/02/2013 Referred for  SNF Placement   Other Referral:   Interview type:  Patient Other interview type:   and daughters, Evie Lacks at bedside    PSYCHOSOCIAL DATA Living Status:  ALONE Admitted from facility:   Level of care:   Primary support name:  Ann Held (dtr) ph#: [phone] Primary support relationship to patient:  CHILD, ADULT Degree of support available:   good    CURRENT CONCERNS Current Concerns  Post-Acute Placement   Other Concerns:    SOCIAL WORK ASSESSMENT / PLAN CSW reviewed PT evaluation recommending SNF for patient at discharge.   Assessment/plan status:  Information/Referral to [employer] Other assessment/ plan:   Information/referral to community resources:   CSW completed FL2 and faxed information out to [employer] [facility].    PATIENT'S/FAMILY'S RESPONSE TO PLAN OF CARE: Patient's daughters informed CSW that patient and family live in [place] & hope to find a SNF there. CSW left message with [facility], awaiting call back re: bed availability.       Raynaldo Opitz, [facility] Clinical Social Worker cell #: [phone]

## 2013-08-02 NOTE — Progress Notes (Signed)
NUTRITION FOLLOW UP  Intervention:   - Ensure Complete TID - Encouraged increased meal intake - RD to continue to monitor   Nutrition Dx:   Inadequate oral intake related to abdominal pain, diarrhea; ongoing - ongoing but now related to poor appetite as evidenced by pt/family report.    Goal:   Pt to meet >/= 90% of their estimated nutrition needs - not met   Monitor:   Weights, labs, intake  Assessment:   Pt is very pleasant 80 yo who presents with abdominal pain nad hx of chronic diarrhea. Abdominal CT-findings 7/6-resolving colitis with inflammatory changes still in the mesentery.   7/22: - Pt has hx of significant weight loss January-April this year-11%. She has maintained her current weight since April.  - Pt c/o of poor tolerance of oral intake since January.  - She has muscle wasting to clavicles, quadriceps, patella and fat loss to arms.  - Pt says she's had diarrhea for so long that she has become "afraid" to eat. - Pt assessed by RD 07/10/13 related to MST score. She has been re-admitted with c/o diarrhea, dehydration, continued poor po intake. She has additional weight loss of 3#, 3% in 2 weeks and worsening malnutrition secondary to inadequate oral intake.  - Urology consult for blocked left kidney. Plans are for her to be transferred to [facility] when she is stable.   7/29:  - Had rapid response 7/22 after being transferred from [facility] ICU involving distended abdomen, elevated temperature of 104.82F, and trouble breathing - placed on nonrebreather - Was off pressors 7/24 - Had bedside SLP evaluation 7/24 with recommendations for dysphagia 1/thin, became increasingly confused and agitated that night - Transferred out of ICU 7/26 - SLP saw pt 7/27 with recommendations for advancement to regular/thin liquid diet - Diet advanced to regular 7/27 - Met with pt and family who report pt has been eating better since pt is no longer on puree diet, ate 30% of lunch yesterday  and did well for breakfast this morning, didn't eat a lot of lunch - Pt agreeable to getting Ensure Complete, likes chocolate, provided pt with chocolate Ensure Complete during visit - Last BM 7/27   Height: Ht Readings from Last 1 Encounters:  07/30/13 5' 1"  (1.549 m)    Weight Status:   Wt Readings from Last 1 Encounters:  07/30/13 112 lb 10.5 oz (51.1 kg)    Re-estimated needs:  Kcal: 1200-1400  Protein: 56-66 g/day Fluid:1.2-1.4 liters daily    Skin: Non-pitting RUE edema, +1 RLE, LLE edema  Diet Order: General   Intake/Output Summary (Last 24 hours) at 08/02/13 1529 Last data filed at 08/02/13 1400  Gross per 24 hour  Intake    400 ml  Output   1150 ml  Net   -750 ml    Last BM: 7/27   Labs:   Recent Labs Lab 07/26/13 1830 07/27/13 0535  07/29/13 0602 07/30/13 0358 08/02/13 0438  NA 138 135*  < > 136* 137 134*  K 2.9* 3.5*  < > 3.0* 3.4* 4.7  CL 102 101  < > 104 103 100  CO2 22 18*  < > 23 25 23   BUN 30* 27*  < > 23 19 16   CREATININE 1.33* 1.21*  < > 0.82 0.80 0.75  CALCIUM 7.7* 7.8*  < > 7.9* 7.8* 8.9  MG  --  2.4  --   --   --   --   PHOS  --  2.1*  --   --   --   --   GLUCOSE 85 177*  < > 89 92 97  < > = values in this interval not displayed.  CBG (last 3)  No results found for this basename: GLUCAP,  in the last 72 hours  Scheduled Meds: . antiseptic oral rinse  15 mL Mouth Rinse BID  . arformoterol  15 mcg Nebulization BID  . atorvastatin  80 mg Oral q1800  . budesonide  3 mg Oral BID  . budesonide (PULMICORT) nebulizer solution  0.5 mg Nebulization BID  . cefTRIAXone (ROCEPHIN)  IV  1 g Intravenous Q24H  . citalopram  10 mg Oral Daily  . furosemide  20 mg Oral Q48H  . heparin subcutaneous  5,000 Units Subcutaneous 3 times per day  . levETIRAcetam  250 mg Oral BID  . lidocaine  1 patch Transdermal Q24H  . metoprolol tartrate  12.5 mg Oral BID  . nystatin  5 mL Oral QID  . pantoprazole  40 mg Oral QAC breakfast  . potassium  chloride  40 mEq Oral BID  . sodium chloride  3 mL Intravenous Q12H  . tiotropium  18 mcg Inhalation Daily  . vancomycin  750 mg Intravenous Q24H    Continuous Infusions: . sodium chloride 10 mL/hr at 08/01/13 [address] [place], [place], [place] Pager [phone] Weekend/After Hours Pager

## 2013-08-02 NOTE — Progress Notes (Signed)
Peripherally Inserted Central Catheter/Midline Placement  The IV Nurse has discussed with the patient and/or persons authorized to consent for the patient, the purpose of this procedure and the potential benefits and risks involved with this procedure.  The benefits include less needle sticks, lab draws from the catheter and patient may be discharged home with the catheter.  Risks include, but not limited to, infection, bleeding, blood clot (thrombus formation), and puncture of an artery; nerve damage and irregular heat beat.  Alternatives to this procedure were also discussed.  PICC/Midline Placement Documentation  PICC / Midline Single Lumen 54/27/06 PICC Right Basilic 41 cm 1 cm (Active)       Edson Snowball 08/02/2013, 9:54 PM

## 2013-08-02 NOTE — Progress Notes (Signed)
Subjective: Patient states she is feeling better today getting a little stronger each day. She denies any left flank pain.   Objective: Physical Exam: BP 97/56  Pulse 65  Temp(Src) 97.4 F (36.3 C) (Oral)  Resp 20  Ht 5\' 1"  (1.549 m)  Wt 112 lb 10.5 oz (51.1 kg)  BMI 21.30 kg/m2  SpO2 98%  General: A&Ox3, NAD, family in room. Abd: Soft, NT, ND. Left flank: Left PCN intact dressing without bleeding, site without hematoma, 24 hr output 625 cc, 200 cc today so far yellow urine with little amount of debris, Cx proteus   Labs: CBC  Recent Labs  08/01/13 0006 08/02/13 0438  WBC 10.1 14.8*  HGB 10.5* 11.6*  HCT 30.7* 34.1*  PLT 127* 186   BMET  Recent Labs  08/02/13 0438  NA 134*  K 4.7  CL 100  CO2 23  GLUCOSE 97  BUN 16  CREATININE 0.75  CALCIUM 8.9   LFT No results found for this basename: PROT, ALBUMIN, AST, ALT, ALKPHOS, BILITOT, BILIDIR, IBILI, LIPASE,  in the last 72 hours PT/INR No results found for this basename: LABPROT, INR,  in the last 72 hours   Studies/Results: No results found.  Assessment/Plan: Urosepsis, blood Cx 7/25 Enterococcus, antibiotics per ID Left hydronephrosis S/p PCN 7/22, output still with debris but clearing, UCx proteus Mirabilis, afebrile Will need nephrostogram to evaluate etiology of hydronephrosis once repeat blood Cx back with no growth and patient stable. Plans per Urology    LOS: 8 days    Rockney Ghee 08/02/2013 2:18 PM

## 2013-08-02 NOTE — Progress Notes (Signed)
Clinical Social Work Department CLINICAL SOCIAL WORK PLACEMENT NOTE 08/02/2013  Patient:  Bridget Ball, Bridget Ball  Account Number:  [id number] Admit date:  07/25/2013  Clinical Social Worker:  Renold Genta  Date/time:  08/02/2013 10:45 AM  Clinical Social Work is seeking post-discharge placement for this patient at the following level of care:   SKILLED NURSING   (*CSW will update this form in Epic as items are completed)   08/02/2013  Patient/family provided with [facility] Department of Clinical Social Work's list of facilities offering this level of care within the geographic area requested by the patient (or if unable, by the patient's family).  08/02/2013  Patient/family informed of their freedom to choose among providers that offer the needed level of care, that participate in Medicare, Medicaid or managed care program needed by the patient, have an available bed and are willing to accept the patient.  08/02/2013  Patient/family informed of [facility]' ownership interest in [facility], as well as of the fact that they are under no obligation to receive care at this facility.  PASARR submitted to EDS on 08/02/2013 PASARR number received on 08/02/2013  FL2 transmitted to all facilities in geographic area requested by pt/family on  08/02/2013 FL2 transmitted to all facilities within larger geographic area on   Patient informed that his/her managed care company has contracts with or will negotiate with  certain facilities, including the following:     Patient/family informed of bed offers received:   Patient chooses bed at  Physician recommends and patient chooses bed at    Patient to be transferred to  on   Patient to be transferred to facility by  Patient and family notified of transfer on  Name of family member notified:    The following physician request were entered in Epic:   Additional Comments:   Raynaldo Opitz, [place] Social Worker cell #: [phone]

## 2013-08-02 NOTE — Progress Notes (Signed)
[facility] PROGRESS NOTE  Bridget Ball KPT:[id number] DOB: 1928-05-04 DOA: 07/25/2013 PCP: Delphina Cahill, MD  Assessment/Plan  80 y/o female with PMH of advanced COPD, smoker, anxiety, microscopic colitis, h/o SAH/seizure presented to [facility] On 7/21 w/ cc: abdominal pain, diarrhea, poor oral intake and diagnosed with left hydro.  Transferred to [facility] from [facility] for consideration of perc drainage. On 7/22, She was transferred to ICU increased resp distress, hypotension.   1. Septic shock with septicemia due to proteus mirabilis and enterococcus AND proteus UTI, off vasopressors; hemodynamically improved -  7/22: blood cultures (GNR, GPC); final PROTEUS MIRABILIS; Patient was on (Vanc, levoflox, aztreonam)  -  7/25 repeat Blood cultures +(GPC) while on abx - TTE without vegetation -  7/26 BCx NGTD  - 7/28 BCx NGTD  -  Continue vancomycin (plan for total of 4 weeks) and ceftriaxone (plan for total of 3 weeks) - PICC line ordered by Dr. Johnnye Sima  2. Acute respiratory failure; CT chest no PE, COPD/emphysema, effusion,.  -improved on prn diuresis, oxygen, inhalers as needed; stopped smoking -  Check ambulating pulse ox prior to d/c    3. COPD/empysema; cont oxygen, inhalers -  Check ambulating pulse ox prior to discharge  4. Left UPJ obstruction with hydronephrosis  -7/22 : s/p left percutaneous nephrostomy; cont per urology  -  Will need f/u nephrostogram/imaging to determine etiology of hydro  5. Mild troponin elevation, suspect element of demand ischemia; no chest pain  -  Echo: Systolic function was normal. The estimated ejection fraction was in the range of 17% to 00%; diastolic dysfunction   Acute on chronic diastolic heart failure with effusions and interstitial edema seen on CT, improved -  Continue oral lasix  -  Carvedilol changed to metoprolol due to low BP   6. Thrombocytopenia likely sepsis related vs consumptive procedure IRL; possible DIC, resolved. - no s/s of bleeding;  hold heparin; HIT antbd negative; TFused platelets on 7/24; TF prn  7. Tachycardia likely sepsis related and resolved -  Continue bb  8. H/o seizure with SAH, stable/seizure free, continue keppra   Diet:  regular Access:  PIV IVF:  off Proph:  heparin  Code Status: DNR Family Communication: spoke with patient and family Disposition Plan: to SNF pending PICC line   Consultants:  Urology, critical care   Procedures:  7/22 : s/p left percutaneous nephrostomy  7/17 CT abdo >>> stable hepatic cysts, cholelithiasis without inflammation, new severe left hydronephrosis concerning for UPJ stenosis.  7/21 CT abdo >>> stable left sided UPJ obstruction with severe hydro, cholelithiasis, stable hepatic cysts.   SIGNIFICANT EVENTS:  7/17 - initial presentation to [facility], discharged home.  7/21 - presented again to [facility] ED for same symptoms (abd pain)  7/22 - transferred to [facility] for urological evaluation,- moved to ICU for respiratory distress- hypotensive in setting of sepsis, treated w/ crystalloid resuscitation & neo infusion. Percutaneous nephrostomy tube placed, cloudy and bloody drainage. Trop I elevated, cards consulted for tachycardia.  7/23: pain resolved. Still hypotensive but weaning pressors.  Antibiotics:  Vancomycin 7-22 <<<7/25  Levaquin 7-22 <<<7/25  Aztreonam 7/22<<<7/25  Ceftriaxone 7/25<<<<<   HPI/Subjective:  Feels well.  Denies fevers, chills, nausea, vomiting.  Diarrhea improving.    Objective: Filed Vitals:   08/01/13 2237 08/02/13 0620 08/02/13 1053 08/02/13 1334  BP: 110/69 113/66  97/56  Pulse: 82 95  65  Temp: 98.7 F (37.1 C) 98.7 F (37.1 C)  97.4 F (36.3 C)  TempSrc: Oral Oral  Oral  Resp: 20 24  20   Height:      Weight:      SpO2: 99% 95% 97% 98%    Intake/Output Summary (Last 24 hours) at 08/02/13 1709 Last data filed at 08/02/13 1400  Gross per 24 hour  Intake    250 ml  Output    950 ml  Net   -700 ml   Filed Weights   07/25/13 1132  07/26/13 0500 07/30/13 0400  Weight: 46.409 kg (102 lb 5 oz) 46.3 kg (102 lb 1.2 oz) 51.1 kg (112 lb 10.5 oz)    Exam:   General:  Thin WF, No acute distress  HEENT:  NCAT, MMM  Cardiovascular:  RRR, nl S1, S2 no mrg, 2+ pulses, warm extremities  Respiratory:  CTAB, no increased WOB  Abdomen:   NABS, soft, NT/ND, left nephrostomy tube with yellow urine with some purulence and sediment  MSK:   Normal tone and bulk, no LEE  Neuro:  Diffusely weak  Data Reviewed: Basic Metabolic Panel:  Recent Labs Lab 07/26/13 1830 07/27/13 0535 07/28/13 0345 07/29/13 0602 07/30/13 0358 08/02/13 0438  NA 138 135* 136* 136* 137 134*  K 2.9* 3.5* 3.4* 3.0* 3.4* 4.7  CL 102 101 106 104 103 100  CO2 22 18* 19 23 25 23   GLUCOSE 85 177* 96 89 92 97  BUN 30* 27* 26* 23 19 16   CREATININE 1.33* 1.21* 0.93 0.82 0.80 0.75  CALCIUM 7.7* 7.8* 7.6* 7.9* 7.8* 8.9  MG  --  2.4  --   --   --   --   PHOS  --  2.1*  --   --   --   --    Liver Function Tests:  Recent Labs Lab 07/27/13 0535  AST 62*  ALT 49*  ALKPHOS 174*  BILITOT 0.7  PROT 4.9*  ALBUMIN 1.8*   No results found for this basename: LIPASE, AMYLASE,  in the last 168 hours No results found for this basename: AMMONIA,  in the last 168 hours CBC:  Recent Labs Lab 07/28/13 0345 07/28/13 0817 07/29/13 0602 07/30/13 0358 08/01/13 0006 08/02/13 0438  WBC 14.7*  --  16.2* 15.0* 10.1 14.8*  HGB 11.3*  --  12.3 11.6* 10.5* 11.6*  HCT 33.6*  --  36.4 33.5* 30.7* 34.1*  MCV 94.9  --  93.8 92.0 93.6 95.0  PLT 21* 17* 39* 55* 127* 186   Cardiac Enzymes:  Recent Labs Lab 07/26/13 1752 07/26/13 1830 07/27/13 0114 07/27/13 0540  CKTOTAL  --  63  --   --   CKMB  --  3.0  --   --   TROPONINI 0.75*  --  0.39* <0.30   BNP (last 3 results) No results found for this basename: PROBNP,  in the last 8760 hours CBG: No results found for this basename: GLUCAP,  in the last 168 hours  Recent Results (from the past 240 hour(s))   MRSA PCR SCREENING     Status: None   Collection Time    07/25/13  6:41 PM      Result Value Ref Range Status   MRSA by PCR NEGATIVE  NEGATIVE Final   Comment:            The GeneXpert MRSA Assay (FDA     approved for NASAL specimens     only), is one component of a     comprehensive MRSA colonization     surveillance program. It is not  intended to diagnose MRSA     infection nor to guide or     monitor treatment for     MRSA infections.  URINE CULTURE     Status: None   Collection Time    07/26/13  4:00 PM      Result Value Ref Range Status   Specimen Description URINE, CLEAN CATCH   Final   Special Requests NONE   Final   Culture  Setup Time     Final   Value: 07/26/2013 19:19     Performed at [facility]     Final   Value: >=100,000 COLONIES/ML     Performed at [facility]   Culture     Final   Value: PROTEUS MIRABILIS     Performed at [facility]   Report Status 07/29/2013 FINAL   Final   Organism ID, Bacteria PROTEUS MIRABILIS   Final  URINE CULTURE     Status: None   Collection Time    07/26/13  4:00 PM      Result Value Ref Range Status   Specimen Description URINE, CLEAN CATCH   Final   Special Requests NONE   Final   Culture  Setup Time     Final   Value: 07/27/2013 04:47     Performed at [place]     Final   Value: >=100,000 COLONIES/ML     Performed at [facility]   Culture     Final   Value: PROTEUS MIRABILIS     Performed at [facility]   Report Status 07/29/2013 FINAL   Final   Organism ID, Bacteria PROTEUS MIRABILIS   Final  CULTURE, BLOOD (ROUTINE X 2)     Status: None   Collection Time    07/26/13  5:52 PM      Result Value Ref Range Status   Specimen Description BLOOD LEFT HAND   Final   Special Requests BOTTLES DRAWN AEROBIC AND ANAEROBIC 10 CC   Final   Culture  Setup Time     Final   Value: 07/26/2013 22:01     Performed at [facility]    Culture     Final   Value: PROTEUS MIRABILIS     Note: SUSCEPTIBILITIES PERFORMED ON PREVIOUS CULTURE WITHIN THE LAST 5 DAYS.     ENTEROCOCCUS SPECIES     Note: SUSCEPTIBILITIES PERFORMED ON PREVIOUS CULTURE WITHIN LAST 5 DAYS     Note: Gram Stain Report Called to,Read Back By and Verified With: STEPHANIE D@10 :34AM ON 07/27/13 BY DANTS   Report Status 08/01/2013 FINAL   Final  CULTURE, BLOOD (ROUTINE X 2)     Status: None   Collection Time    07/26/13  5:57 PM      Result Value Ref Range Status   Specimen Description BLOOD LEFT ARM   Final   Special Requests BOTTLES DRAWN AEROBIC AND ANAEROBIC 10 CC   Final   Culture  Setup Time     Final   Value: 07/26/2013 22:01     Performed at [facility]   Culture     Final   Value: PROTEUS MIRABILIS     ENTEROCOCCUS SPECIES     Note: COMBINATION THERAPY OF HIGH DOSE AMPICILLIN OR VANCOMYCIN, PLUS AN AMINOGLYCOSIDE, IS USUALLY INDICATED FOR SERIOUS ENTEROCOCCAL INFECTIONS.     Note: Gram Stain Report Called to,Read Back By and Verified With:  STEPHANIE D@10 :34AM ON 07/27/13 BY DANTS     Performed at [facility]   Report Status 08/01/2013 FINAL   Final   Organism ID, Bacteria PROTEUS MIRABILIS   Final   Organism ID, Bacteria ENTEROCOCCUS SPECIES   Final  URINE CULTURE     Status: None   Collection Time    07/26/13 10:34 PM      Result Value Ref Range Status   Specimen Description URINE, RANDOM DIRECT ASPIRATE FROM KIDNEY   Final   Special Requests NONE   Final   Culture  Setup Time     Final   Value: 07/27/2013 04:48     Performed at [place]     Final   Value: >=100,000 COLONIES/ML     Performed at [facility]   Culture     Final   Value: PROTEUS MIRABILIS     Performed at [facility]   Report Status 07/29/2013 FINAL   Final   Organism ID, Bacteria PROTEUS MIRABILIS   Final  CLOSTRIDIUM DIFFICILE BY PCR     Status: None   Collection Time    07/28/13  1:51 PM      Result  Value Ref Range Status   C difficile by pcr NEGATIVE  NEGATIVE Final   Comment: Performed at [place], BLOOD (ROUTINE X 2)     Status: None   Collection Time    07/29/13  9:24 AM      Result Value Ref Range Status   Specimen Description BLOOD RIGHT HAND   Final   Special Requests BOTTLES DRAWN AEROBIC ONLY 2CC   Final   Culture  Setup Time     Final   Value: 07/29/2013 14:07     Performed at [facility]   Culture     Final   Value: ENTEROCOCCUS SPECIES     Note: SUSCEPTIBILITIES PERFORMED ON PREVIOUS CULTURE WITHIN THE LAST 5 DAYS.     Note: Gram Stain Report Called to,Read Back By and Verified With: Sharyon Medicus 07/30/13 @ 1:26PM BY RUSCOE A.     Performed at [facility]   Report Status 08/01/2013 FINAL   Final  CULTURE, BLOOD (ROUTINE X 2)     Status: None   Collection Time    07/29/13  9:39 AM      Result Value Ref Range Status   Specimen Description BLOOD LEFT HAND   Final   Special Requests BOTTLES DRAWN AEROBIC ONLY 2CC   Final   Culture  Setup Time     Final   Value: 07/29/2013 14:07     Performed at [facility]   Culture     Final   Value: ENTEROCOCCUS SPECIES     Note: SUSCEPTIBILITIES PERFORMED ON PREVIOUS CULTURE WITHIN THE LAST 5 DAYS.     Note: Gram Stain Report Called to,Read Back By and Verified With: JUNIOR RIMANDO 07/30/13 @ 7:30PM BY RUSCOE A.     Performed at [facility]   Report Status 08/01/2013 FINAL   Final  CULTURE, BLOOD (ROUTINE X 2)     Status: None   Collection Time    07/30/13  4:15 PM      Result Value Ref Range Status   Specimen Description BLOOD RIGHT ARM   Final   Special Requests BOTTLES DRAWN AEROBIC AND ANAEROBIC 6 CC   Final   Culture  Setup Time     Final  Value: 07/30/2013 22:40     Performed at [facility]   Culture     Final   Value:        BLOOD CULTURE RECEIVED NO GROWTH TO DATE CULTURE WILL BE HELD FOR 5 DAYS BEFORE ISSUING A FINAL NEGATIVE REPORT     Performed at  [facility]   Report Status PENDING   Incomplete  CULTURE, BLOOD (ROUTINE X 2)     Status: None   Collection Time    08/01/13 12:06 AM      Result Value Ref Range Status   Specimen Description BLOOD RIGHT ANTECUBITAL   Final   Special Requests BOTTLES DRAWN AEROBIC AND ANAEROBIC 10CC   Final   Culture  Setup Time     Final   Value: 08/01/2013 03:49     Performed at [facility]   Culture     Final   Value:        BLOOD CULTURE RECEIVED NO GROWTH TO DATE CULTURE WILL BE HELD FOR 5 DAYS BEFORE ISSUING A FINAL NEGATIVE REPORT     Performed at [facility]   Report Status PENDING   Incomplete     Studies: No results found.  Scheduled Meds: . antiseptic oral rinse  15 mL Mouth Rinse BID  . arformoterol  15 mcg Nebulization BID  . atorvastatin  80 mg Oral q1800  . budesonide  3 mg Oral BID  . budesonide (PULMICORT) nebulizer solution  0.5 mg Nebulization BID  . cefTRIAXone (ROCEPHIN)  IV  1 g Intravenous Q24H  . citalopram  10 mg Oral Daily  . feeding supplement (ENSURE COMPLETE)  237 mL Oral TID BM  . furosemide  20 mg Oral Q48H  . heparin subcutaneous  5,000 Units Subcutaneous 3 times per day  . levETIRAcetam  250 mg Oral BID  . lidocaine  1 patch Transdermal Q24H  . metoprolol tartrate  12.5 mg Oral BID  . nystatin  5 mL Oral QID  . pantoprazole  40 mg Oral QAC breakfast  . potassium chloride  40 mEq Oral BID  . sodium chloride  3 mL Intravenous Q12H  . tiotropium  18 mcg Inhalation Daily  . vancomycin  750 mg Intravenous Q24H   Continuous Infusions: . sodium chloride 10 mL/hr at 08/01/13 2006    Principal Problem:   AKI (acute kidney injury) Active Problems:   Diarrhea   Abdominal pain, unspecified site   Hydronephrosis, left   Dehydration   Elevated troponin    Time spent: 30 min    Ygnacio Fecteau, [facility]  [facility] Pager [phone]. If 7PM-7AM, please contact night-coverage at [email or web address], password [facility] 08/02/2013, 5:09 PM   LOS: 8 days

## 2013-08-02 NOTE — Progress Notes (Signed)
Physical Therapy Treatment Patient Details Name: Bridget Ball MRN: [id number] DOB: Aug 28, 1928 Today's Date: 08/02/2013    History of Present Illness 80 yo female admitted with septic shock, acute resp failure. s/p L percutaneous nephrostomy 7/22. Pt was Ind prior to admission    PT Comments    Pt was OOB in recliner earlier reported nursing.  So asssited pt OOB to amb 2 short distances then assisted back to bed.  Pt progressing slowly and plans to D/C to SNF.  Follow Up Recommendations  SNF     Equipment Recommendations  Rolling walker with 5" wheels    Recommendations for Other Services       Precautions / Restrictions Precautions Precautions: Fall Precaution Comments: drain L side/bad R shoulder Restrictions Weight Bearing Restrictions: No    Mobility  Bed Mobility Overal bed mobility: Needs Assistance Bed Mobility: Supine to Sit;Sit to Supine     Supine to sit: Mod assist Sit to supine: Mod assist   General bed mobility comments: Assist for bil LEs and trunk to upright. Increased time.   Transfers Overall transfer level: Needs assistance Equipment used: Rolling walker (2 wheeled) Transfers: Sit to/from Stand Sit to Stand: Min assist;Mod assist         General transfer comment: assist to rise, steady, and control descent.  Cues for UE placement.  Caution to R shoulder  Ambulation/Gait Ambulation/Gait assistance: +2 physical assistance;Min assist Ambulation Distance (Feet): 40 Feet (20 feet x 2 one sitting rest break) Assistive device: Rolling walker (2 wheeled) Gait Pattern/deviations: Step-to pattern;Decreased stride length;Trunk flexed Gait velocity: decreased   General Gait Details: assist to support/stabilize pt, maneuver with walker. pt is very deconditioned. c/o bil LE pain posterior legs during attempt at ambulation. Only able to tolerate short distance.    Stairs            Wheelchair Mobility    Modified Rankin (Stroke Patients  Only)       Balance Overall balance assessment: Needs assistance         Standing balance support: Bilateral upper extremity supported Standing balance-Leahy Scale: Poor Standing balance comment: pt loses balance posteriorly                    Cognition Arousal/Alertness: Awake/alert Behavior During Therapy: WFL for tasks assessed/performed Overall Cognitive Status: Within Functional Limits for tasks assessed                      Exercises      General Comments        Pertinent Vitals/Pain C/o B calf pain during gait "cramping"    Home Living                      Prior Function            PT Goals (current goals can now be found in the care plan section) Progress towards PT goals: Progressing toward goals    Frequency  Min 3X/week    PT Plan      Co-evaluation             End of Session Equipment Utilized During Treatment: Gait belt Activity Tolerance: Patient limited by fatigue;Patient limited by pain Patient left: in bed;with call bell/phone within reach     Time: 1420-1447 PT Time Calculation (min): 27 min  Charges:  $Gait Training: 8-22 mins $Therapeutic Activity: 8-22 mins  G Codes:      Bridget Ball  PTA [facility]  Acute  Rehab Pager      [phone]

## 2013-08-03 ENCOUNTER — Inpatient Hospital Stay
Admission: RE | Admit: 2013-08-03 | Discharge: 2013-08-04 | Disposition: A | Discharge status: Nursing Home (Includes ICF) | Payer: Medicare Other | Source: Ambulatory Visit | Attending: Internal Medicine | Admitting: Internal Medicine

## 2013-08-03 ENCOUNTER — Other Ambulatory Visit: Payer: Self-pay | Admitting: *Deleted

## 2013-08-03 DIAGNOSIS — N10 Acute tubulo-interstitial nephritis: Secondary | ICD-10-CM

## 2013-08-03 DIAGNOSIS — A4181 Sepsis due to Enterococcus: Secondary | ICD-10-CM

## 2013-08-03 DIAGNOSIS — A4150 Gram-negative sepsis, unspecified: Secondary | ICD-10-CM

## 2013-08-03 DIAGNOSIS — R6521 Severe sepsis with septic shock: Secondary | ICD-10-CM

## 2013-08-03 DIAGNOSIS — A419 Sepsis, unspecified organism: Secondary | ICD-10-CM

## 2013-08-03 LAB — BASIC METABOLIC PANEL
ANION GAP: 12 (ref 5–15)
BUN: 19 mg/dL (ref 6–23)
CALCIUM: 8.6 mg/dL (ref 8.4–10.5)
CO2: 23 meq/L (ref 19–32)
Chloride: 99 mEq/L (ref 96–112)
Creatinine, Ser: 0.67 mg/dL (ref 0.50–1.10)
GFR calc Af Amer: >90 mL/min (ref >90)
GFR calc non Af Amer: 80 mL/min — ABNORMAL LOW (ref >90)
Glucose, Bld: 97 mg/dL (ref 70–99)
POTASSIUM: 4.9 meq/L (ref 3.7–5.3)
SODIUM: 134 meq/L — AB (ref 137–147)

## 2013-08-03 LAB — CBC
HEMATOCRIT: 32.5 % — AB (ref 36.0–46.0)
Hemoglobin: 10.7 g/dL — ABNORMAL LOW (ref 12.0–15.0)
MCH: 30.8 pg (ref 26.0–34.0)
MCHC: 32.9 g/dL (ref 30.0–36.0)
MCV: 93.7 fL (ref 78.0–100.0)
Platelets: 267 10*3/uL (ref 150–400)
RBC: 3.47 MIL/uL — ABNORMAL LOW (ref 3.87–5.11)
RDW: 15 % (ref 11.5–15.5)
WBC: 15.4 10*3/uL — AB (ref 4.0–10.5)

## 2013-08-03 MED ORDER — HYDROCODONE-ACETAMINOPHEN 5-325 MG PO TABS: ORAL_TABLET | ORAL | Status: DC

## 2013-08-03 MED ORDER — HEPARIN SOD (PORK) LOCK FLUSH 100 UNIT/ML IV SOLN
250.0000 [IU] | INTRAVENOUS | Status: AC | PRN
  Administered 2013-08-03: 250 [IU]

## 2013-08-03 MED ORDER — ENSURE COMPLETE PO LIQD: 237.0000 mL | Freq: Three times a day (TID) | ORAL | Status: DC

## 2013-08-03 MED ORDER — DEXTROSE 5 % IV SOLN: 1.0000 g | INTRAVENOUS | Status: DC

## 2013-08-03 MED ORDER — POTASSIUM CHLORIDE CRYS ER 20 MEQ PO TBCR: 20.0000 meq | EXTENDED_RELEASE_TABLET | Freq: Every day | ORAL | Status: DC

## 2013-08-03 MED ORDER — FUROSEMIDE 20 MG PO TABS: 20.0000 mg | ORAL_TABLET | ORAL | Status: DC

## 2013-08-03 MED ORDER — ALBUTEROL SULFATE HFA 108 (90 BASE) MCG/ACT IN AERS: 2.0000 | INHALATION_SPRAY | Freq: Four times a day (QID) | RESPIRATORY_TRACT | Status: AC | PRN

## 2013-08-03 MED ORDER — VANCOMYCIN HCL IN DEXTROSE 750-5 MG/150ML-% IV SOLN: 750.0000 mg | INTRAVENOUS | Status: DC

## 2013-08-03 MED ORDER — LEVETIRACETAM 250 MG PO TABS: 250.0000 mg | ORAL_TABLET | Freq: Two times a day (BID) | ORAL | Status: DC

## 2013-08-03 MED ORDER — ATORVASTATIN CALCIUM 80 MG PO TABS: 80.0000 mg | ORAL_TABLET | Freq: Every day | ORAL | Status: DC

## 2013-08-03 MED ORDER — BIOTENE DRY MOUTH MT LIQD: 15.0000 mL | Freq: Two times a day (BID) | OROMUCOSAL | Status: DC

## 2013-08-03 MED ORDER — NYSTATIN 100000 UNIT/ML MT SUSP: 5.0000 mL | Freq: Four times a day (QID) | OROMUCOSAL | Status: DC

## 2013-08-03 MED ORDER — FLUCONAZOLE 100 MG PO TABS: 100.0000 mg | ORAL_TABLET | Freq: Every day | ORAL | Status: DC

## 2013-08-03 MED ORDER — LIDOCAINE VISCOUS 2 % MT SOLN: 15.0000 mL | Freq: Four times a day (QID) | OROMUCOSAL | Status: DC | PRN

## 2013-08-03 MED ORDER — FLUCONAZOLE 100 MG PO TABS
100.0000 mg | ORAL_TABLET | Freq: Every day | ORAL | Status: DC
  Administered 2013-08-03: 100 mg via ORAL
  Filled 2013-08-03: qty 1

## 2013-08-03 MED ORDER — LORAZEPAM 0.5 MG PO TABS: 0.5000 mg | ORAL_TABLET | Freq: Three times a day (TID) | ORAL | Status: DC

## 2013-08-03 MED ORDER — METOPROLOL TARTRATE 12.5 MG HALF TABLET: 12.5000 mg | ORAL_TABLET | Freq: Two times a day (BID) | ORAL | Status: DC

## 2013-08-03 MED ORDER — TIOTROPIUM BROMIDE MONOHYDRATE 18 MCG IN CAPS: 18.0000 ug | ORAL_CAPSULE | Freq: Every day | RESPIRATORY_TRACT | Status: DC

## 2013-08-03 NOTE — Telephone Encounter (Signed)
[facility] 

## 2013-08-03 NOTE — Progress Notes (Signed)
Patient is set to discharge to [facility] today. Patient & daughters aware. Discharge packet given to RN, Ify. [facility] called for transport.   Clinical Social Work Department CLINICAL SOCIAL WORK PLACEMENT NOTE 08/03/2013  Patient:  Bridget Ball, Bridget Ball  Account Number:  [id number] Admit date:  07/25/2013  Clinical Social Worker:  Renold Genta  Date/time:  08/02/2013 10:45 AM  Clinical Social Work is seeking post-discharge placement for this patient at the following level of care:   SKILLED NURSING   (*CSW will update this form in Epic as items are completed)   08/02/2013  Patient/family provided with [place] Department of Clinical Social Work's list of facilities offering this level of care within the geographic area requested by the patient (or if unable, by the patient's family).  08/02/2013  Patient/family informed of their freedom to choose among providers that offer the needed level of care, that participate in Medicare, Medicaid or managed care program needed by the patient, have an available bed and are willing to accept the patient.  08/02/2013  Patient/family informed of [facility]' ownership interest in [facility], as well as of the fact that they are under no obligation to receive care at this facility.  PASARR submitted to EDS on 08/02/2013 PASARR number received on 08/02/2013  FL2 transmitted to all facilities in geographic area requested by pt/family on  08/02/2013 FL2 transmitted to all facilities within larger geographic area on   Patient informed that his/her managed care company has contracts with or will negotiate with  certain facilities, including the following:     Patient/family informed of bed offers received:  08/02/2013 Patient chooses bed at [facility] Physician recommends and patient chooses bed at    Patient to be transferred to [facility] on  08/03/2013 Patient to be transferred to facility by [facility] Patient  and family notified of transfer on 08/03/2013 Name of family member notified:  daughters Evie Lacks at bedside  The following physician request were entered in Epic:   Additional Comments:   Raynaldo Opitz, [place] Social Worker cell #: [phone]

## 2013-08-03 NOTE — Discharge Summary (Addendum)
Physician Discharge Summary  Bridget Ball IRC:[id number] DOB: 10/07/1928 DOA: 07/25/2013  PCP: Delphina Cahill, MD  Admit date: 07/25/2013 Discharge date: 08/03/2013  Recommendations for Outpatient Follow-up:  1. Discharge to skilled nursing facility for ongoing physical and occupational therapy 2. Continue vancomycin through August 22, then stop 3. Please check weekly vancomycin trough and CMP to be reviewed by supervising physician 4. CBC in 1 week to check WBC count 5. Continue ceftriaxone through August 15, then stop 6. Continue fluconazole through August 22, then stop 7. Followup with Doctor Dahlstedt, Urology, within 2 weeks for outpatient nephrostogram, possible cystoscopy, left retrograde and ureteroscopy 8. Daily weights.  If she loses more than 3-lbs in 24 hours or 5-lbs in 1 week, please stop her lasix.  If she gains more than 3-lbs in 24 hours or 5-lbs in 1 week, please notify supervising physician.   9. Cardiology follow up within 1 month 10. Routine follow up with Dr. Leonie Man and Dr. Laural Golden per family.  Discharge Diagnoses:  Principal Problem:   Gram negative septic shock Active Problems:   Diarrhea   Colitis   Abdominal pain, unspecified site   Hydronephrosis, left   Dehydration   AKI (acute kidney injury)   Elevated troponin   NSTEMI (non-ST elevated myocardial infarction) type 2   Acute pyelonephritis   Septic shock due to Enterococcus species   Septicemia   Discharge Condition: Stable, improved  Diet recommendation: Regular  Wt Readings from Last 3 Encounters:  07/30/13 51.1 kg (112 lb 10.5 oz)  07/25/13 46.267 kg (102 lb)  07/11/13 49.034 kg (108 lb 1.6 oz)    History of present illness:  The patient is an 80 year old female with history of COPD, arthritis, stroke, depression, cancer, seizures, memory difficulties, chronic diarrhea, and hiatal hernia. She came to the emergency department with diarrhea, somnolence, and poor appetite. She was admitted for  abdominal pain and dehydration. She had some acute kidney injury and CT scan demonstrated hydronephrosis. She was transferred from [facility] to [facility] for urology consultation at which time she developed septic shock and was transferred to the intensive care unit.  Hospital Course:   Septic shock with septicemia due to Proteus mirabilis and enterococcus AND proteus mirabilis UTI (nephritis).  She was admitted to the intensive care unit and started on vasopressors. Blood cultures from 7/22 grew Proteus parabola 7 enterococcus. She was on vancomycin, levofloxacin and aztreonam for empiric coverage. Repeat blood cultures from 7/25 through gram-positive cocci while on antibiotics. She underwent TTE which demonstrated no evidence of vegetation. Her blood cultures from 7/26 and 7/28 are no growth to date. Infectious disease Doctor Johnnye Sima  Was consult and he recommended 4 weeks of vancomycin and 3 weeks of ceftriaxone, started on 7/26, the first date of negative blood culture.  A PICC line was placed on 7/29.   Left UPJ obstruction with hydronephrosis. She was seen by urology and on 7/22 she underwent left percutaneous nephrostomy tube placement. She will need followup nephrostogram and imaging at the discretion of urology as an outpatient. She will need to followup with urology within 2-3 weeks.  AKI, creatinine peaked at 1.7 on 7/21 and trended down to 0.67 by date of discharge with placement of nephrostomy tube, hydration, and tx of septic shock.  Acute respiratory failure secondary to septic shock. Her CT chest demonstrated no pulmonary embolism. She does have underlying COPD/emphysema and her CT demonstrated some effusion. She may have also had some mild acute on chronic diastolic heart failure  secondary to fluid resuscitation from septic shock. She was diuresed with Lasix and her breathing improved. She was encouraged to avoid secondhand smoke and not smoke. She may use albuterol as needed.  She was on room air and able to ambulate without oxygen levels dropping below 88% the time of discharge. Recommend that she continue intermittent oral Lasix.  COPD/emphysema, no evidence of acute exacerbation. She continued oxygen as needed and bronchodilators.  NSTEMI type II with mild elevation of troponin to 0.75.  Probably secondary to demand ischemia due to septic shock. She did not have chest pain. Her echocardiogram demonstrated normal systolic function with an ejection fraction of 55-60% and grade 1 diastolic dysfunction. She should follow up with cardiology within one month for possible outpatient testing.  Not discharged on BB secondary to hypotension, but was started on statin.    Thrombocytopenia possibly secondary to mild DIC from sepsis versus consumption secondary to her procedure.  Nadir of 17 on 7/24, however, her INR was normal and fibrinoggen was elevated suggesting NOT DIC related.  Initially her heparin was discontinued however her hit antibody panel was negative. She did not exhibit signs of bleeding. She was transfused 2 units of platelets on 7/24.  History of seizure secondary to subarachnoid hemorrhage, or stable and seizure-free for duration of hospitalization. She continued Keppra.    Colitis, improved diarrhea with resumption of budesonide.  Leukocytosis due to underlying infection.  Please repeat CBC in 1 week to ensure resolution   Anemia of chronic disease, hemoglobin approximately stable near 10.7mg /dl.    Mild asymptomatic hyponatremia due to sepsis and diarrhea.  Stable at 134.  Routine labs as above.  Consultants:  Urology, critical care  Procedures:  7/22 : s/p left percutaneous nephrostomy  7/17 CT abdo >>> stable hepatic cysts, cholelithiasis without inflammation, new severe left hydronephrosis concerning for UPJ stenosis.  7/21 CT abdo >>> stable left sided UPJ obstruction with severe hydro, cholelithiasis, stable hepatic cysts.  SIGNIFICANT EVENTS:   7/17 - initial presentation to [facility] ED, discharged home.  7/21 - presented again to [facility] ED for same symptoms (abd pain)  7/22 - transferred to [facility] for urological evaluation,- moved to ICU for respiratory distress- hypotensive in setting of sepsis, treated w/ crystalloid resuscitation & neo infusion. Percutaneous nephrostomy tube placed, cloudy and bloody drainage. Trop I elevated, cards consulted for tachycardia.  7/23: pain resolved. Still hypotensive but weaning pressors.  Antibiotics:  Vancomycin 7-22 <<<7/25  Levaquin 7-22 <<<7/25  Aztreonam 7/22<<<7/25  Ceftriaxone 7/25<<<<<  Discharge Exam: Filed Vitals:   08/03/13 1033  BP: 90/52  Pulse: 90  Temp:   Resp:    Filed Vitals:   08/02/13 2300 08/03/13 0533 08/03/13 0743 08/03/13 1033  BP: 114/71 117/60  90/52  Pulse: 81 92  90  Temp: 98.2 F (36.8 C) 99.2 F (37.3 C) 98.9 F (37.2 C)   TempSrc: Oral Oral Oral   Resp: 20 18    Height:      Weight:      SpO2: 100% 95%      General: Thin WF, No acute distress HEENT: NCAT, MMM, thrush almost completely resolved just a few minimally white patches post OP and tongue Cardiovascular: RRR, nl S1, S2 no mrg, 2+ pulses, warm extremities  Respiratory: CTAB, no increased WOB  Abdomen: NABS, soft, NT/ND, left nephrostomy tube with yellow urine with some purulence and sediment  MSK: Normal tone and bulk, no LEE  Neuro: Diffusely weak   Discharge Instructions  Discharge Instructions   (HEART FAILURE PATIENTS) Call MD:  Anytime you have any of the following symptoms: 1) 3 pound weight gain in 24 hours or 5 pounds in 1 week 2) shortness of breath, with or without a dry hacking cough 3) swelling in the hands, feet or stomach 4) if you have to sleep on extra pillows at night in order to breathe.    Complete by:  As directed      Call MD for:  difficulty breathing, headache or visual disturbances    Complete by:  As directed      Call MD for:  extreme fatigue    Complete by:  As  directed      Call MD for:  hives    Complete by:  As directed      Call MD for:  persistant dizziness or light-headedness    Complete by:  As directed      Call MD for:  persistant nausea and vomiting    Complete by:  As directed      Call MD for:  redness, tenderness, or signs of infection (pain, swelling, redness, odor or green/yellow discharge around incision site)    Complete by:  As directed      Call MD for:  severe uncontrolled pain    Complete by:  As directed      Call MD for:  temperature >100.4    Complete by:  As directed      Diet general    Complete by:  As directed      Increase activity slowly    Complete by:  As directed             Medication List         albuterol 108 (90 BASE) MCG/ACT inhaler  Commonly known as:  PROVENTIL HFA;VENTOLIN HFA  Inhale 2 puffs into the lungs every 6 (six) hours as needed for wheezing or shortness of breath.     antiseptic oral rinse Liqd  15 mLs by Mouth Rinse route 2 (two) times daily.     atorvastatin 80 MG tablet  Commonly known as:  LIPITOR  Take 1 tablet (80 mg total) by mouth daily at 6 PM.     budesonide 3 MG 24 hr capsule  Commonly known as:  ENTOCORT EC  Take 1 capsule (3 mg total) by mouth 2 (two) times daily.     budesonide-formoterol 160-4.5 MCG/ACT inhaler  Commonly known as:  SYMBICORT  Inhale 2 puffs into the lungs 2 (two) times daily.     cefTRIAXone 1 g in dextrose 5 % 50 mL  Inject 1 g into the vein daily.     citalopram 10 MG tablet  Commonly known as:  CELEXA  TAKE 1 TABLET (10 MG TOTAL) BY MOUTH DAILY.     docusate sodium 100 MG capsule  Commonly known as:  COLACE  Take one capsule a day unless diarrhea begins     feeding supplement (ENSURE COMPLETE) Liqd  Take 237 mLs by mouth 3 (three) times daily between meals.     fluconazole 100 MG tablet  Commonly known as:  DIFLUCAN  Take 1 tablet (100 mg total) by mouth daily.     furosemide 20 MG tablet  Commonly known as:  LASIX  Take 1  tablet (20 mg total) by mouth every other day.     HYDROcodone-acetaminophen 5-325 MG per tablet  Commonly known as:  NORCO/VICODIN  Take one tablet every 6-8 hours for  pain     levETIRAcetam 250 MG tablet  Commonly known as:  KEPPRA  Take 1 tablet (250 mg total) by mouth 2 (two) times daily.     lidocaine 2 % solution  Commonly known as:  XYLOCAINE  Use as directed 15 mLs in the mouth or throat every 6 (six) hours as needed for mouth pain.     LORazepam 0.5 MG tablet  Commonly known as:  ATIVAN  Take 1 tablet (0.5 mg total) by mouth 3 (three) times daily.     Melatonin 5 MG Tabs  Take 5 mg by mouth at bedtime.     nystatin 100000 UNIT/ML suspension  Commonly known as:  MYCOSTATIN  Take 5 mLs (500,000 Units total) by mouth 4 (four) times daily.     pantoprazole 40 MG tablet  Commonly known as:  PROTONIX  Take 1 tablet (40 mg total) by mouth daily before breakfast.     potassium chloride SA 20 MEQ tablet  Commonly known as:  K-DUR,KLOR-CON  Take 1 tablet (20 mEq total) by mouth daily.     tiotropium 18 MCG inhalation capsule  Commonly known as:  SPIRIVA  Place 1 capsule (18 mcg total) into inhaler and inhale daily.     Vancomycin 750 MG/150ML Soln  Commonly known as:  VANCOCIN  Inject 150 mLs (750 mg total) into the vein daily.       Follow-up Information   Follow up with DAHLSTEDT, Lillette Boxer, MD. (We will call for followup)    Specialty:  Urology   Contact information:   [place] [address] [phone]       Follow up with Delphina Cahill, MD. Schedule an appointment as soon as possible for a visit in 1 month.   Specialty:  Internal Medicine   Contact information:    [place] [address] [phone]       Follow up with [facility]. Schedule an appointment as soon as possible for a visit in 1 month.   Specialty:  Cardiology   Contact information:   [address] [place] [address] [phone]      Follow up  with REHMAN,NAJEEB U, MD. Schedule an appointment as soon as possible for a visit in 2 months.   Specialty:  Gastroenterology   Contact information:   [address], [address] [place] [address] [phone]        The results of significant diagnostics from this hospitalization (including imaging, microbiology, ancillary and laboratory) are listed below for reference.    Significant Diagnostic Studies: Ct Abdomen Pelvis Wo Contrast  07/25/2013   CLINICAL DATA:  Nausea, vomiting and diarrhea.  EXAM: CT ABDOMEN AND PELVIS WITHOUT CONTRAST  TECHNIQUE: Multidetector CT imaging of the abdomen and pelvis was performed following the standard protocol without IV contrast.  COMPARISON:  07/21/2013.  FINDINGS: The lung bases are stable.  Patchy bibasilar atelectasis.  The liver is grossly normal and stable. Small cysts are again demonstrated. Cholelithiasis is noted. The spleen is normal in size. No focal lesions. The pancreas is grossly normal.  Severe left-sided hydronephrosis due to a chronic UPJ obstruction. No obstructing ureteral calculus. There is persistent contrast in the left kidney from the recent CT scan. The right kidney is normal.  The bladder is unremarkable. Uterus and ovaries are unremarkable and stable. No mesenteric or retroperitoneal mass or adenopathy. Stable scattered lymph nodes. Stable advanced atherosclerotic calcifications involving the aorta and branch vessels.  No pelvic mass or adenopathy.  No inguinal mass or adenopathy.  The bony structures are stable.  IMPRESSION: Stable left-sided UPJ obstruction with severe hydronephrosis.  Chololithiasis.  Bibasilar atelectasis.  Stable hepatic cysts.   Electronically Signed   By: Kalman Jewels M.D.   On: 07/25/2013 14:20   Dg Chest 1 View  07/28/2013   CLINICAL DATA:  Weakness.  Edema.  EXAM: CHEST - 1 VIEW  COMPARISON:  07/27/2013  FINDINGS: There is irregular interstitial thickening, which appears minimally increased from the  previous day's study. Basilar coarse reticular and discoid opacity, likely atelectasis, has increased and there are small effusions which have increased from the prior exam. No focal lung consolidation. No other change.  IMPRESSION: Pulmonary edema with mild worsening since the previous day's study.   Electronically Signed   By: Lajean Manes M.D.   On: 07/28/2013 09:03   Dg Chest 2 View  07/25/2013   CLINICAL DATA:  Severe abdominal pain  EXAM: CHEST  2 VIEW  COMPARISON:  None.  FINDINGS: The lungs are hyperinflated likely secondary to COPD. There is no focal parenchymal opacity, pleural effusion, or pneumothorax. The heart and mediastinal contours are unremarkable. There is thoracic aortic atherosclerosis.  The osseous structures are unremarkable.  IMPRESSION: No active cardiopulmonary disease.   Electronically Signed   By: Kathreen Devoid   On: 07/25/2013 13:51   Dg Chest 2 View  07/08/2013   CLINICAL DATA:  Chest abdominal pain.  Weakness.  EXAM: CHEST  2 VIEW  COMPARISON:  None.  FINDINGS: The heart is normal in size. There is tortuosity, ectasia and calcification of the thoracic aorta. The pulmonary hila appear normal. The lungs demonstrate emphysematous changes. No acute pulmonary findings. No pleural effusion. The bony thorax is intact.  IMPRESSION: Emphysematous changes but no acute pulmonary findings.   Electronically Signed   By: Kalman Jewels M.D.   On: 07/08/2013 15:13   Dg Shoulder Right  07/30/2013   CLINICAL DATA:  Pain for 1 week duration  EXAM: RIGHT SHOULDER - 2+ VIEW  COMPARISON:  None.  FINDINGS: Frontal and Y scapular images were obtained. There is mild narrowing of the glenohumeral joint. No fracture or dislocation. No erosive change or intra-articular calcification.  IMPRESSION: Mild osteoarthritic change in the glenohumeral joint. No fracture or dislocation.   Electronically Signed   By: Lowella Grip M.D.   On: 07/30/2013 09:36   Dg Abd 1 View  07/26/2013   CLINICAL DATA:   Abdominal distention.  EXAM: ABDOMEN - 1 VIEW  COMPARISON:  None.  FINDINGS: Supine abdomen shows gas scattered along a nondilated colon. There is no gaseous small bowel dilatation. Probe overlies the lower midline pelvis. Bones are demineralized with degenerative changes noted in the lower lumbar spine.  IMPRESSION: No evidence for bowel obstruction although there is diffuse gaseous filling of the colon and a component of underlying mild colonic ileus would be a consideration.   Electronically Signed   By: Misty Stanley M.D.   On: 07/26/2013 19:06   Ct Angio Chest Pe W/cm &/or Wo Cm  07/28/2013   CLINICAL DATA:  Shortness of breath.  Hypoxia.  Elevated D-dimer.  EXAM: CT ANGIOGRAPHY CHEST WITH CONTRAST  TECHNIQUE: Multidetector CT imaging of the chest was performed using the standard protocol during bolus administration of intravenous contrast. Multiplanar CT image reconstructions and MIPs were obtained to evaluate the vascular anatomy.  CONTRAST: 142mL OMNIPAQUE IOHEXOL 350 MG/ML IV.  COMPARISON:  None.  FINDINGS: Contrast opacification  of the pulmonary arteries is good. Respiratory motion blurred images throughout the chest. Overall, the study is of moderate to good diagnostic quality.  No filling defects within either main pulmonary artery or their branches in either lung to suggest pulmonary embolism. Heart enlarged with mild left ventricular enlargement and right atrial enlargement. Minimal fluid in the superior pericardial recess; no significant pericardial effusion. Prominent epicardial fat. Extensive 3 vessel coronary atherosclerosis. Moderate to severe atherosclerosis involving the thoracic and upper abdominal aorta without aneurysm or dissection.  Severe emphysematous changes throughout both lungs. Mild diffuse interstitial pulmonary edema as noted on the portable chest x-ray earlier same date. Moderate bilateral pleural effusions, right greater than left, and associated dense passive atelectasis in  the lower lobes. Central airways patent with calcified tracheobronchial cartilages.  No significant mediastinal, hilar, or axillary lymphadenopathy. Nodule in the thyroid isthmus measuring approximately 1.3 cm. Diffuse body wall edema.  Reflux of contrast into the intrahepatic IVC and right hepatic vein. 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 Calcified and noncalcified plaque at the origin of the SMA with possible stenosis. Visualized upper abdomen otherwise unremarkable for the early arterial phase of enhancement. Bone window images demonstrate degenerative changes in the lower thoracic spine and exaggeration of the usual thoracic kyphosis.  Review of the MIP images confirms the above findings.  IMPRESSION: 1. No evidence of pulmonary embolism. 2. COPD/emphysema. Mild CHF with mild diffuse interstitial pulmonary edema. 3. Bilateral pleural effusions, right greater than left, with associated passive atelectasis in the lower lobes. 4. Cardiomegaly with left ventricular enlargement and right atrial enlargement. Reflux of contrast into the intrahepatic IVC and right hepatic vein is consistent with right heart failure and/or tricuspid regurgitation. Three vessel coronary atherosclerosis. 5. Severe COPD/emphysema. 6. 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 7. 1.3 cm nodule in the thyroid isthmus. No further imaging followup is felt necessary. This follows ACR consensus guidelines: Managing Incidental Thyroid Nodules Detected on Imaging: White Paper of the ACR Incidental Thyroid Findings Committee. J Am Coll Radiol 2015; 12:143-150.   Electronically Signed   By: Evangeline Dakin M.D.   On: 07/28/2013 17:23   Ct Angio Abdomen W/cm &/or Wo Contrast  07/10/2013   CLINICAL DATA:  Ischemic colitis.  EXAM: CT ANGIOGRAPHY ABDOMEN  TECHNIQUE: Multidetector CT imaging of the abdomen was performed using the standard protocol during  bolus administration of intravenous contrast. Multiplanar reconstructed images including MIPs were obtained and reviewed to evaluate the vascular anatomy.  CONTRAST:  143mL OMNIPAQUE IOHEXOL 350 MG/ML SOLN  COMPARISON:  CT abdomen and pelvis 07/08/2013  FINDINGS: A small right pleural effusion is present. Mild dependent atelectasis is evident bilaterally. Multiple hepatic cysts are again noted. No focal parenchymal lesions are present. The spleen is within normal limits. The stomach and duodenum are within normal limits. Calcifications are again noted within the head of the pancreas and may be within the pancreatic duct. The common bile duct is dilated, measuring 10 mm. No obstructing lesion is present. Multiple layering stones are present within the gallbladder. There is no focal inflammation. Previously-seen dilation of the left renal collecting system is resolved. The kidneys are unremarkable.  Dense atherosclerotic calcifications are present in the abdominal aorta. Arterial phase imaging demonstrates atherosclerotic calcifications at the origin of the mesenteric vessels without focal stenosis. The superior mesenteric artery and celiac artery are intact. No definite stenosis is present at the inferior mesenteric artery. The proximal  iliac arteries are within normal limits. Bone windows demonstrate a vacuum disc at multiple levels. Rightward curvature of the thoracolumbar spine is evident.  Review of the MIP images confirms the above findings.  IMPRESSION: 1. Atherosclerotic changes without focal stenosis of the mesenteric arteries or renal arteries. 2. Improved appearance of the colon suggesting resolving colitis. Inflammatory changes are still seen in the mesentery. 3. Chololithiasis without evidence for cholecystitis. 4. Resolution of left sided hydronephrosis.   Electronically Signed   By: Lawrence Santiago M.D.   On: 07/10/2013 15:33   Ct Abdomen Pelvis W Contrast  07/21/2013   CLINICAL DATA:  Abdominal pain.   EXAM: CT ABDOMEN AND PELVIS WITH CONTRAST  TECHNIQUE: Multidetector CT imaging of the abdomen and pelvis was performed using the standard protocol following bolus administration of intravenous contrast.  CONTRAST:  26mL OMNIPAQUE IOHEXOL 300 MG/ML SOLN, 153mL OMNIPAQUE IOHEXOL 300 MG/ML SOLN  COMPARISON:  CT scan of July 10, 2013.  FINDINGS: Multilevel degenerative disc disease of the lumbar spine is noted. Visualized lung bases appear normal.  Cholelithiasis is noted without inflammation. Multiple hepatic cysts are noted. The spleen and pancreas appear normal. Adrenal glands appear normal. Right kidney appears normal. Severe left hydronephrosis is noted without definite ureteral dilatation or obstructing calculus. This is concerning for ureteropelvic junction stenosis. Atherosclerotic calcifications abdominal aorta are noted without aneurysm formation. Large amount of stool is noted throughout the colon which may represent constipation. No abnormal fluid collection is noted. Urinary bladder appears normal. No significant adenopathy is noted.  IMPRESSION: Stable hepatic cysts.  Stool is noted throughout the colon concerning for constipation.  Cholelithiasis without inflammation.  Interval development of severe left hydronephrosis concerning for ureteropelvic junction stenosis. No obstructing calculus is noted.   Electronically Signed   By: Sabino Dick M.D.   On: 07/21/2013 20:49   Ct Abdomen Pelvis W Contrast  07/08/2013   CLINICAL DATA:  Mid abdominal pain radiating into the back. Nausea and vomiting. Diarrhea.  EXAM: CT ABDOMEN AND PELVIS WITH CONTRAST  TECHNIQUE: Multidetector CT imaging of the abdomen and pelvis was performed using the standard protocol following bolus administration of intravenous contrast.  CONTRAST:  15mL OMNIPAQUE IOHEXOL 300 MG/ML IV. Oral contrast was also administered.  COMPARISON:  None.  FINDINGS: Wall thickening involving the cecum, ascending colon and proximal and mid transverse  colon, associated with hyperemia. No abnormal fluid collection to suggest abscess. Interposition of the hepatic flexure of the colon between the liver and the right hemidiaphragm. No ascites. Large stool burden throughout the remainder of the colon. Scattered sigmoid colon diverticula without evidence of acute diverticulitis. Normal appearing stomach and small bowel.  Multiple small low-attenuation lesions throughout the liver, the largest approximating 8 mm in the medial segment left lobe. The largest lesions represent simple cyst, and I suspect that the smaller ones are cysts as well, given their conspicuity for their small size. Normal spleen. Borderline pancreatic ductal dilation, as there is a stone within the pancreatic duct in the head of the pancreas. A calcification is also present within the pancreatic head parenchyma. Numerous gallstones are present within the gallbladder, including several in the gallbladder neck. Extrahepatic bile duct upper normal in caliber approximating 8 mm. High attenuation material in the distal common bile duct at the ampulla, likely sludge. No CT evidence for acute cholecystitis.  Severe left hydronephrosis without associated ureteral dilation. Delayed excretion of contrast by the left kidney. Numerous small cortical cysts involving the left kidney. Normal-appearing right kidney. Extensive aortoiliofemoral  atherosclerosis without aneurysm. No significant lymphadenopathy.  Urinary bladder unremarkable. Uterus atrophic consistent with age. No adnexal masses or free pelvic fluid. Numerous pelvic phleboliths.  Bone window images demonstrate osseous demineralization, degenerative changes throughout the lumbar spine, degenerative changes involving the sacroiliac joints. Visualized lung bases clear apart from minimal scarring in the lower lobes. Emphysematous changes are present.  IMPRESSION: 1. Colitis involving the cecum, ascending colon, and proximal and mid transverse colon. No  evidence of abscess. 2. Severe left hydronephrosis without associated ureteral dilation. Mild obstruction of the left kidney with delayed contrast excretion. 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 3. Cholelithiasis without CT evidence of acute cholecystitis. 4. Borderline extrahepatic biliary ductal dilation with probable sludge in the distal common bile duct. 5. Chronic calcific pancreatitis involving the pancreatic head, including a stone in the pancreatic duct. 6. Sigmoid colon diverticulosis without evidence of acute diverticulitis. 7. Numerous hepatic lesions felt to represent simple cysts, given their conspicuity for their small size. The largest lesions are simple cysts. 8. Emphysematous changes in the lower lobes.   Electronically Signed   By: Evangeline Dakin M.D.   On: 07/08/2013 15:19   Ir Perc Nephrostomy Left  07/27/2013   CLINICAL DATA:  Left hydronephrosis and sepsis.  EXAM: 1. ULTRASOUND GUIDANCE FOR PUNCTURE OF THE LEFT RENAL COLLECTING SYSTEM. 2. LEFT PERCUTANEOUS NEPHROSTOMY TUBE PLACEMENT.  COMPARISON:  CT of the abdomen on 07/25/2013.  ANESTHESIA/SEDATION: 0.5 mg IV Versed; 25 mcg IV Fentanyl.  Total Moderate Sedation Time  15 minutes  CONTRAST:  5 ml Omnipaque 300  MEDICATIONS: No additional medications.  FLUOROSCOPY TIME:  36 seconds.  PROCEDURE: The procedure, risks, benefits, and alternatives were explained to the patient's daughter. Questions regarding the procedure were encouraged and answered. The patient's daughter understands and consents to the procedure.  The left flank region was prepped with Betadine in a sterile fashion, and a sterile drape was applied covering the operative field. A sterile gown and sterile gloves were used for the procedure. Local anesthesia was provided with 1% Lidocaine.  Ultrasound was used to localize the left kidney. Under direct ultrasound guidance, an 18 gauge needle was advanced into the renal collecting  system. Ultrasound image documentation was performed. Aspiration of urine sample was performed followed by contrast injection.  A guidewire was advanced into the collecting system through the needle. Percutaneous tract dilatation was then performed over the guidewire. A 10 French percutaneous nephrostomy tube was then advanced and formed in the collecting system. Catheter position was confirmed by fluoroscopy after contrast injection.  The catheter was secured at the skin with a Prolene retention suture and Stat-Lock device. A gravity bag was placed.  COMPLICATIONS: None.  FINDINGS: Ultrasound demonstrates severe left-sided hydronephrosis. Urine return was cloudy. A sample was sent for culture. The nephrostomy tube was formed at the level of the renal pelvis.  IMPRESSION: Placement of left percutaneous nephrostomy tube with 10 French tube advanced to the level of the renal pelvis. Urine return is cloudy and infected appearing. A sample of urine from the left kidney was sent for culture analysis.   Electronically Signed   By: Aletta Edouard M.D.   On: 07/27/2013 07:37   Ir US Guide Vasc Access Left  07/27/2013   CLINICAL DATA:  Left hydronephrosis and sepsis.  EXAM: 1. ULTRASOUND GUIDANCE FOR PUNCTURE OF THE LEFT RENAL COLLECTING SYSTEM. 2. LEFT PERCUTANEOUS NEPHROSTOMY TUBE PLACEMENT.  COMPARISON:  CT of the abdomen on 07/25/2013.  ANESTHESIA/SEDATION:  0.5 mg IV Versed; 25 mcg IV Fentanyl.  Total Moderate Sedation Time  15 minutes  CONTRAST:  5 ml Omnipaque 300  MEDICATIONS: No additional medications.  FLUOROSCOPY TIME:  36 seconds.  PROCEDURE: The procedure, risks, benefits, and alternatives were explained to the patient's daughter. Questions regarding the procedure were encouraged and answered. The patient's daughter understands and consents to the procedure.  The left flank region was prepped with Betadine in a sterile fashion, and a sterile drape was applied covering the operative field. A sterile gown and  sterile gloves were used for the procedure. Local anesthesia was provided with 1% Lidocaine.  Ultrasound was used to localize the left kidney. Under direct ultrasound guidance, an 18 gauge needle was advanced into the renal collecting system. Ultrasound image documentation was performed. Aspiration of urine sample was performed followed by contrast injection.  A guidewire was advanced into the collecting system through the needle. Percutaneous tract dilatation was then performed over the guidewire. A 10 French percutaneous nephrostomy tube was then advanced and formed in the collecting system. Catheter position was confirmed by fluoroscopy after contrast injection.  The catheter was secured at the skin with a Prolene retention suture and Stat-Lock device. A gravity bag was placed.  COMPLICATIONS: None.  FINDINGS: Ultrasound demonstrates severe left-sided hydronephrosis. Urine return was cloudy. A sample was sent for culture. The nephrostomy tube was formed at the level of the renal pelvis.  IMPRESSION: Placement of left percutaneous nephrostomy tube with 10 French tube advanced to the level of the renal pelvis. Urine return is cloudy and infected appearing. A sample of urine from the left kidney was sent for culture analysis.   Electronically Signed   By: Aletta Edouard M.D.   On: 07/27/2013 07:37   Dg Chest Port 1 View  07/27/2013   CLINICAL DATA:  Worsening shortness of breath.  EXAM: PORTABLE CHEST - 1 VIEW  COMPARISON:  Chest radiograph performed 07/26/2013  FINDINGS: The lungs are well-aerated. Vascular congestion is noted, with peribronchial thickening. Mildly increased interstitial markings are again seen, possibly reflecting minimal interstitial edema. No pleural effusion or pneumothorax is identified.  The cardiomediastinal silhouette is within normal limits. No acute osseous abnormalities are seen.  IMPRESSION: Vascular congestion, with peribronchial thickening. Mildly increased interstitial markings  may reflect minimal interstitial edema.   Electronically Signed   By: Garald Balding M.D.   On: 07/27/2013 00:54   Dg Chest Port 1 View  07/26/2013   CLINICAL DATA:  Shortness of breath with chest pain and tachycardia. Fever. History of COPD and seizures.  EXAM: PORTABLE CHEST - 1 VIEW  COMPARISON:  Radiographs 07/25/2013 and 07/08/2013.  FINDINGS: 1751 hr. The heart size and mediastinal contours are stable. Accentuated interstitial markings compared with the prior studies may be related to portable technique, although mild superimposed edema is difficult to exclude. There is no airspace disease, pleural effusion or pneumothorax. The osseous structures appear unchanged.  IMPRESSION: Mild accentuation of interstitial markings, likely technical. No definite acute findings.   Electronically Signed   By: Camie Patience M.D.   On: 07/26/2013 18:06    Microbiology: Recent Results (from the past 240 hour(s))  MRSA PCR SCREENING     Status: None   Collection Time    07/25/13  6:41 PM      Result Value Ref Range Status   MRSA by PCR NEGATIVE  NEGATIVE Final   Comment:            The GeneXpert MRSA Assay (FDA  approved for NASAL specimens     only), is one component of a     comprehensive MRSA colonization     surveillance program. It is not     intended to diagnose MRSA     infection nor to guide or     monitor treatment for     MRSA infections.  URINE CULTURE     Status: None   Collection Time    07/26/13  4:00 PM      Result Value Ref Range Status   Specimen Description URINE, CLEAN CATCH   Final   Special Requests NONE   Final   Culture  Setup Time     Final   Value: 07/26/2013 19:19     Performed at [place]     Final   Value: >=100,000 COLONIES/ML     Performed at [facility]   Culture     Final   Value: PROTEUS MIRABILIS     Performed at [facility]   Report Status 07/29/2013 FINAL   Final   Organism ID, Bacteria PROTEUS MIRABILIS    Final  URINE CULTURE     Status: None   Collection Time    07/26/13  4:00 PM      Result Value Ref Range Status   Specimen Description URINE, CLEAN CATCH   Final   Special Requests NONE   Final   Culture  Setup Time     Final   Value: 07/27/2013 04:47     Performed at [place]     Final   Value: >=100,000 COLONIES/ML     Performed at [facility]   Culture     Final   Value: PROTEUS MIRABILIS     Performed at [facility]   Report Status 07/29/2013 FINAL   Final   Organism ID, Bacteria PROTEUS MIRABILIS   Final  CULTURE, BLOOD (ROUTINE X 2)     Status: None   Collection Time    07/26/13  5:52 PM      Result Value Ref Range Status   Specimen Description BLOOD LEFT HAND   Final   Special Requests BOTTLES DRAWN AEROBIC AND ANAEROBIC 10 CC   Final   Culture  Setup Time     Final   Value: 07/26/2013 22:01     Performed at [facility]   Culture     Final   Value: PROTEUS MIRABILIS     Note: SUSCEPTIBILITIES PERFORMED ON PREVIOUS CULTURE WITHIN THE LAST 5 DAYS.     ENTEROCOCCUS SPECIES     Note: SUSCEPTIBILITIES PERFORMED ON PREVIOUS CULTURE WITHIN LAST 5 DAYS     Note: Gram Stain Report Called to,Read Back By and Verified With: STEPHANIE D@10 :34AM ON 07/27/13 BY DANTS   Report Status 08/01/2013 FINAL   Final  CULTURE, BLOOD (ROUTINE X 2)     Status: None   Collection Time    07/26/13  5:57 PM      Result Value Ref Range Status   Specimen Description BLOOD LEFT ARM   Final   Special Requests BOTTLES DRAWN AEROBIC AND ANAEROBIC 10 CC   Final   Culture  Setup Time     Final   Value: 07/26/2013 22:01     Performed at [facility]   Culture     Final   Value: [facility]  Note: COMBINATION THERAPY OF HIGH DOSE AMPICILLIN OR VANCOMYCIN, PLUS AN AMINOGLYCOSIDE, IS USUALLY INDICATED FOR SERIOUS ENTEROCOCCAL INFECTIONS.     Note: Gram Stain Report Called to,Read Back By and Verified With:  STEPHANIE D@10 :34AM ON 07/27/13 BY DANTS     Performed at [facility]   Report Status 08/01/2013 FINAL   Final   Organism ID, Bacteria PROTEUS MIRABILIS   Final   Organism ID, Bacteria ENTEROCOCCUS SPECIES   Final  URINE CULTURE     Status: None   Collection Time    07/26/13 10:34 PM      Result Value Ref Range Status   Specimen Description URINE, RANDOM DIRECT ASPIRATE FROM KIDNEY   Final   Special Requests NONE   Final   Culture  Setup Time     Final   Value: 07/27/2013 04:48     Performed at [place]     Final   Value: >=100,000 COLONIES/ML     Performed at [facility]   Culture     Final   Value: PROTEUS MIRABILIS     Performed at [facility]   Report Status 07/29/2013 FINAL   Final   Organism ID, Bacteria PROTEUS MIRABILIS   Final  CLOSTRIDIUM DIFFICILE BY PCR     Status: None   Collection Time    07/28/13  1:51 PM      Result Value Ref Range Status   C difficile by pcr NEGATIVE  NEGATIVE Final   Comment: Performed at [place], BLOOD (ROUTINE X 2)     Status: None   Collection Time    07/29/13  9:24 AM      Result Value Ref Range Status   Specimen Description BLOOD RIGHT HAND   Final   Special Requests BOTTLES DRAWN AEROBIC ONLY 2CC   Final   Culture  Setup Time     Final   Value: 07/29/2013 14:07     Performed at [facility]   Culture     Final   Value: ENTEROCOCCUS SPECIES     Note: SUSCEPTIBILITIES PERFORMED ON PREVIOUS CULTURE WITHIN THE LAST 5 DAYS.     Note: Gram Stain Report Called to,Read Back By and Verified With: Sharyon Medicus 07/30/13 @ 1:26PM BY RUSCOE A.     Performed at [facility]   Report Status 08/01/2013 FINAL   Final  CULTURE, BLOOD (ROUTINE X 2)     Status: None   Collection Time    07/29/13  9:39 AM      Result Value Ref Range Status   Specimen Description BLOOD LEFT HAND   Final   Special Requests BOTTLES DRAWN AEROBIC ONLY 2CC   Final   Culture  Setup  Time     Final   Value: 07/29/2013 14:07     Performed at [facility]   Culture     Final   Value: ENTEROCOCCUS SPECIES     Note: SUSCEPTIBILITIES PERFORMED ON PREVIOUS CULTURE WITHIN THE LAST 5 DAYS.     Note: Gram Stain Report Called to,Read Back By and Verified With: JUNIOR RIMANDO 07/30/13 @ 7:30PM BY RUSCOE A.     Performed at [facility]   Report Status 08/01/2013 FINAL   Final  CULTURE, BLOOD (ROUTINE X 2)     Status: None   Collection Time    07/30/13  4:15 PM      Result Value Ref Range Status  Specimen Description BLOOD RIGHT ARM   Final   Special Requests BOTTLES DRAWN AEROBIC AND ANAEROBIC 6 CC   Final   Culture  Setup Time     Final   Value: 07/30/2013 22:40     Performed at [facility]   Culture     Final   Value:        BLOOD CULTURE RECEIVED NO GROWTH TO DATE CULTURE WILL BE HELD FOR 5 DAYS BEFORE ISSUING A FINAL NEGATIVE REPORT     Performed at [facility]   Report Status PENDING   Incomplete  CULTURE, BLOOD (ROUTINE X 2)     Status: None   Collection Time    08/01/13 12:06 AM      Result Value Ref Range Status   Specimen Description BLOOD RIGHT ANTECUBITAL   Final   Special Requests BOTTLES DRAWN AEROBIC AND ANAEROBIC 10CC   Final   Culture  Setup Time     Final   Value: 08/01/2013 03:49     Performed at [facility]   Culture     Final   Value:        BLOOD CULTURE RECEIVED NO GROWTH TO DATE CULTURE WILL BE HELD FOR 5 DAYS BEFORE ISSUING A FINAL NEGATIVE REPORT     Performed at [facility]   Report Status PENDING   Incomplete     Labs: Basic Metabolic Panel:  Recent Labs Lab 07/28/13 0345 07/29/13 0602 07/30/13 0358 08/02/13 0438 08/03/13 0411  NA 136* 136* 137 134* 134*  K 3.4* 3.0* 3.4* 4.7 4.9  CL 106 104 103 100 99  CO2 19 23 25 23 23   GLUCOSE 96 89 92 97 97  BUN 26* 23 19 16 19   CREATININE 0.93 0.82 0.80 0.75 0.67  CALCIUM 7.6* 7.9* 7.8* 8.9 8.6   Liver Function Tests: No results  found for this basename: AST, ALT, ALKPHOS, BILITOT, PROT, ALBUMIN,  in the last 168 hours No results found for this basename: LIPASE, AMYLASE,  in the last 168 hours No results found for this basename: AMMONIA,  in the last 168 hours CBC:  Recent Labs Lab 07/29/13 0602 07/30/13 0358 08/01/13 0006 08/02/13 0438 08/03/13 0411  WBC 16.2* 15.0* 10.1 14.8* 15.4*  HGB 12.3 11.6* 10.5* 11.6* 10.7*  HCT 36.4 33.5* 30.7* 34.1* 32.5*  MCV 93.8 92.0 93.6 95.0 93.7  PLT 39* 55* 127* 186 267   Cardiac Enzymes: No results found for this basename: CKTOTAL, CKMB, CKMBINDEX, TROPONINI,  in the last 168 hours BNP: BNP (last 3 results) No results found for this basename: PROBNP,  in the last 8760 hours CBG: No results found for this basename: GLUCAP,  in the last 168 hours  Time coordinating discharge: 45 minutes  Signed:  Gracelynn Bircher  [facility] 08/03/2013, 10:50 AM

## 2013-08-03 NOTE — Progress Notes (Signed)
Patient discharged to SNF via ambulance, discharge instructions/packet prepared by CSW for the nursing facility and  Given to EMS transporter for the facility. No open wound noted but redness on the buttocks, Report given Jim Like at the [place] center. Patient's daughter at the bedside during transportation.

## 2013-08-03 NOTE — Progress Notes (Signed)
Subjective: Patient reports that she is feeling better. She is having no pain.  Objective: Vital signs in last 24 hours: Temp:  [97.4 F (36.3 C)-99.2 F (37.3 C)] 98.9 F (37.2 C) (07/30 0743) Pulse Rate:  [65-92] 92 (07/30 0533) Resp:  [18-20] 18 (07/30 0533) BP: (97-117)/(56-71) 117/60 mmHg (07/30 0533) SpO2:  [95 %-100 %] 95 % (07/30 0533)  Intake/Output from previous day: 07/29 0701 - 07/30 0700 In: 240 [I.V.:170; IV Piggyback:50] Out: 638 [Urine:475] Intake/Output this shift:    Physical Exam:  Constitutional: Vital signs reviewed. WD WN in NAD   Eyes: PERRL, No scleral icterus.   Pulmonary/Chest: Normal effort   Lab Results:  Recent Labs  08/01/13 0006 08/02/13 0438 08/03/13 0411  HGB 10.5* 11.6* 10.7*  HCT 30.7* 34.1* 32.5*   BMET  Recent Labs  08/02/13 0438 08/03/13 0411  NA 134* 134*  K 4.7 4.9  CL 100 99  CO2 23 23  GLUCOSE 97 97  BUN 16 19  CREATININE 0.75 0.67  CALCIUM 8.9 8.6   No results found for this basename: LABPT, INR,  in the last 72 hours No results found for this basename: LABURIN,  in the last 72 hours Results for orders placed during the hospital encounter of 07/25/13  MRSA PCR SCREENING     Status: None   Collection Time    07/25/13  6:41 PM      Result Value Ref Range Status   MRSA by PCR NEGATIVE  NEGATIVE Final   Comment:            The GeneXpert MRSA Assay (FDA     approved for NASAL specimens     only), is one component of a     comprehensive MRSA colonization     surveillance program. It is not     intended to diagnose MRSA     infection nor to guide or     monitor treatment for     MRSA infections.  URINE CULTURE     Status: None   Collection Time    07/26/13  4:00 PM      Result Value Ref Range Status   Specimen Description URINE, CLEAN CATCH   Final   Special Requests NONE   Final   Culture  Setup Time     Final   Value: 07/26/2013 19:19     Performed at [place]     Final    Value: >=100,000 COLONIES/ML     Performed at [facility]   Culture     Final   Value: PROTEUS MIRABILIS     Performed at [facility]   Report Status 07/29/2013 FINAL   Final   Organism ID, Bacteria PROTEUS MIRABILIS   Final  URINE CULTURE     Status: None   Collection Time    07/26/13  4:00 PM      Result Value Ref Range Status   Specimen Description URINE, CLEAN CATCH   Final   Special Requests NONE   Final   Culture  Setup Time     Final   Value: 07/27/2013 04:47     Performed at [place]     Final   Value: >=100,000 COLONIES/ML     Performed at [facility]   Culture     Final   Value: PROTEUS MIRABILIS     Performed at [facility]   Report Status  07/29/2013 FINAL   Final   Organism ID, Bacteria PROTEUS MIRABILIS   Final  CULTURE, BLOOD (ROUTINE X 2)     Status: None   Collection Time    07/26/13  5:52 PM      Result Value Ref Range Status   Specimen Description BLOOD LEFT HAND   Final   Special Requests BOTTLES DRAWN AEROBIC AND ANAEROBIC 10 CC   Final   Culture  Setup Time     Final   Value: 07/26/2013 22:01     Performed at [facility]   Culture     Final   Value: PROTEUS MIRABILIS     Note: SUSCEPTIBILITIES PERFORMED ON PREVIOUS CULTURE WITHIN THE LAST 5 DAYS.     ENTEROCOCCUS SPECIES     Note: SUSCEPTIBILITIES PERFORMED ON PREVIOUS CULTURE WITHIN LAST 5 DAYS     Note: Gram Stain Report Called to,Read Back By and Verified With: STEPHANIE D@10 :34AM ON 07/27/13 BY DANTS   Report Status 08/01/2013 FINAL   Final  CULTURE, BLOOD (ROUTINE X 2)     Status: None   Collection Time    07/26/13  5:57 PM      Result Value Ref Range Status   Specimen Description BLOOD LEFT ARM   Final   Special Requests BOTTLES DRAWN AEROBIC AND ANAEROBIC 10 CC   Final   Culture  Setup Time     Final   Value: 07/26/2013 22:01     Performed at [facility]   Culture     Final   Value: PROTEUS MIRABILIS      ENTEROCOCCUS SPECIES     Note: COMBINATION THERAPY OF HIGH DOSE AMPICILLIN OR VANCOMYCIN, PLUS AN AMINOGLYCOSIDE, IS USUALLY INDICATED FOR SERIOUS ENTEROCOCCAL INFECTIONS.     Note: Gram Stain Report Called to,Read Back By and Verified With: STEPHANIE D@10 :34AM ON 07/27/13 BY DANTS     Performed at [facility]   Report Status 08/01/2013 FINAL   Final   Organism ID, Bacteria PROTEUS MIRABILIS   Final   Organism ID, Bacteria ENTEROCOCCUS SPECIES   Final  URINE CULTURE     Status: None   Collection Time    07/26/13 10:34 PM      Result Value Ref Range Status   Specimen Description URINE, RANDOM DIRECT ASPIRATE FROM KIDNEY   Final   Special Requests NONE   Final   Culture  Setup Time     Final   Value: 07/27/2013 04:48     Performed at [place]     Final   Value: >=100,000 COLONIES/ML     Performed at [facility]   Culture     Final   Value: PROTEUS MIRABILIS     Performed at [facility]   Report Status 07/29/2013 FINAL   Final   Organism ID, Bacteria PROTEUS MIRABILIS   Final  CLOSTRIDIUM DIFFICILE BY PCR     Status: None   Collection Time    07/28/13  1:51 PM      Result Value Ref Range Status   C difficile by pcr NEGATIVE  NEGATIVE Final   Comment: Performed at [place], BLOOD (ROUTINE X 2)     Status: None   Collection Time    07/29/13  9:24 AM      Result Value Ref Range Status   Specimen Description BLOOD RIGHT HAND   Final   Special Requests BOTTLES DRAWN AEROBIC ONLY 2CC  Final   Culture  Setup Time     Final   Value: 07/29/2013 14:07     Performed at [facility]   Culture     Final   Value: ENTEROCOCCUS SPECIES     Note: SUSCEPTIBILITIES PERFORMED ON PREVIOUS CULTURE WITHIN THE LAST 5 DAYS.     Note: Gram Stain Report Called to,Read Back By and Verified With: Sharyon Medicus 07/30/13 @ 1:26PM BY RUSCOE A.     Performed at [facility]   Report Status 08/01/2013 FINAL   Final   CULTURE, BLOOD (ROUTINE X 2)     Status: None   Collection Time    07/29/13  9:39 AM      Result Value Ref Range Status   Specimen Description BLOOD LEFT HAND   Final   Special Requests BOTTLES DRAWN AEROBIC ONLY 2CC   Final   Culture  Setup Time     Final   Value: 07/29/2013 14:07     Performed at [facility]   Culture     Final   Value: ENTEROCOCCUS SPECIES     Note: SUSCEPTIBILITIES PERFORMED ON PREVIOUS CULTURE WITHIN THE LAST 5 DAYS.     Note: Gram Stain Report Called to,Read Back By and Verified With: JUNIOR RIMANDO 07/30/13 @ 7:30PM BY RUSCOE A.     Performed at [facility]   Report Status 08/01/2013 FINAL   Final  CULTURE, BLOOD (ROUTINE X 2)     Status: None   Collection Time    07/30/13  4:15 PM      Result Value Ref Range Status   Specimen Description BLOOD RIGHT ARM   Final   Special Requests BOTTLES DRAWN AEROBIC AND ANAEROBIC 6 CC   Final   Culture  Setup Time     Final   Value: 07/30/2013 22:40     Performed at [facility]   Culture     Final   Value:        BLOOD CULTURE RECEIVED NO GROWTH TO DATE CULTURE WILL BE HELD FOR 5 DAYS BEFORE ISSUING A FINAL NEGATIVE REPORT     Performed at [facility]   Report Status PENDING   Incomplete  CULTURE, BLOOD (ROUTINE X 2)     Status: None   Collection Time    08/01/13 12:06 AM      Result Value Ref Range Status   Specimen Description BLOOD RIGHT ANTECUBITAL   Final   Special Requests BOTTLES DRAWN AEROBIC AND ANAEROBIC 10CC   Final   Culture  Setup Time     Final   Value: 08/01/2013 03:49     Performed at [facility]   Culture     Final   Value:        BLOOD CULTURE RECEIVED NO GROWTH TO DATE CULTURE WILL BE HELD FOR 5 DAYS BEFORE ISSUING A FINAL NEGATIVE REPORT     Performed at [facility]   Report Status PENDING   Incomplete    Studies/Results: No results found.  Assessment/Plan:   Hydronephrosis of left kidney, status post percutaneous nephrostomy tube  placement and treatment of positive cultures. Her renal function is improving. She will be discharged home soon.    I will arrange followup. She will need nephrostogram, possible cystoscopy, left retrograde and ureteroscopy. This can all be done as an outpatient following/during rehabilitation.   LOS: 9 days   Franchot Gallo M 08/03/2013, 7:58 AM

## 2013-08-04 ENCOUNTER — Encounter (HOSPITAL_COMMUNITY): Payer: Self-pay | Admitting: Emergency Medicine

## 2013-08-04 ENCOUNTER — Emergency Department (HOSPITAL_COMMUNITY)
Admission: EM | Admit: 2013-08-04 | Discharge: 2013-08-04 | Payer: Medicare Other | Attending: Emergency Medicine | Admitting: Emergency Medicine

## 2013-08-04 ENCOUNTER — Other Ambulatory Visit: Payer: Self-pay | Admitting: *Deleted

## 2013-08-04 ENCOUNTER — Emergency Department (HOSPITAL_COMMUNITY): Payer: Medicare Other

## 2013-08-04 ENCOUNTER — Encounter (HOSPITAL_COMMUNITY): Payer: Self-pay

## 2013-08-04 ENCOUNTER — Ambulatory Visit (HOSPITAL_COMMUNITY)
Admit: 2013-08-04 | Discharge: 2013-08-04 | Disposition: A | Discharge status: Home / Self Care | Payer: Medicare Other | Attending: Urology | Admitting: Urology

## 2013-08-04 ENCOUNTER — Inpatient Hospital Stay
Admission: RE | Admit: 2013-08-04 | Discharge: 2013-09-06 | Disposition: A | Discharge status: Home / Self Care | Payer: Medicare Other | Source: Ambulatory Visit | Attending: Internal Medicine | Admitting: Internal Medicine

## 2013-08-04 DIAGNOSIS — J4489 Other specified chronic obstructive pulmonary disease: Secondary | ICD-10-CM | POA: Insufficient documentation

## 2013-08-04 DIAGNOSIS — M129 Arthropathy, unspecified: Secondary | ICD-10-CM | POA: Diagnosis not present

## 2013-08-04 DIAGNOSIS — F329 Major depressive disorder, single episode, unspecified: Secondary | ICD-10-CM | POA: Insufficient documentation

## 2013-08-04 DIAGNOSIS — T8389XA Other specified complication of genitourinary prosthetic devices, implants and grafts, initial encounter: Secondary | ICD-10-CM | POA: Diagnosis present

## 2013-08-04 DIAGNOSIS — N058 Unspecified nephritic syndrome with other morphologic changes: Secondary | ICD-10-CM | POA: Insufficient documentation

## 2013-08-04 DIAGNOSIS — N99528 Other complication of other external stoma of urinary tract: Secondary | ICD-10-CM

## 2013-08-04 DIAGNOSIS — J449 Chronic obstructive pulmonary disease, unspecified: Secondary | ICD-10-CM | POA: Insufficient documentation

## 2013-08-04 DIAGNOSIS — Z85828 Personal history of other malignant neoplasm of skin: Secondary | ICD-10-CM | POA: Diagnosis not present

## 2013-08-04 DIAGNOSIS — Z87891 Personal history of nicotine dependence: Secondary | ICD-10-CM | POA: Diagnosis not present

## 2013-08-04 DIAGNOSIS — F3289 Other specified depressive episodes: Secondary | ICD-10-CM | POA: Insufficient documentation

## 2013-08-04 DIAGNOSIS — Z8673 Personal history of transient ischemic attack (TIA), and cerebral infarction without residual deficits: Secondary | ICD-10-CM | POA: Diagnosis not present

## 2013-08-04 DIAGNOSIS — Z79899 Other long term (current) drug therapy: Secondary | ICD-10-CM | POA: Diagnosis not present

## 2013-08-04 DIAGNOSIS — G40909 Epilepsy, unspecified, not intractable, without status epilepticus: Secondary | ICD-10-CM | POA: Diagnosis not present

## 2013-08-04 DIAGNOSIS — Y849 Medical procedure, unspecified as the cause of abnormal reaction of the patient, or of later complication, without mention of misadventure at the time of the procedure: Secondary | ICD-10-CM | POA: Insufficient documentation

## 2013-08-04 DIAGNOSIS — Z880 Allergy status to penicillin: Secondary | ICD-10-CM | POA: Insufficient documentation

## 2013-08-04 LAB — BASIC METABOLIC PANEL
ANION GAP: 9 (ref 5–15)
BUN: 15 mg/dL (ref 6–23)
CALCIUM: 8.8 mg/dL (ref 8.4–10.5)
CO2: 27 mEq/L (ref 19–32)
CREATININE: 0.66 mg/dL (ref 0.50–1.10)
Chloride: 100 mEq/L (ref 96–112)
GFR calc non Af Amer: 80 mL/min — ABNORMAL LOW (ref >90)
Glucose, Bld: 99 mg/dL (ref 70–99)
Potassium: 4.5 mEq/L (ref 3.7–5.3)
Sodium: 136 mEq/L — ABNORMAL LOW (ref 137–147)

## 2013-08-04 LAB — CBC
HCT: 32.9 % — ABNORMAL LOW (ref 36.0–46.0)
Hemoglobin: 11.1 g/dL — ABNORMAL LOW (ref 12.0–15.0)
MCH: 32 pg (ref 26.0–34.0)
MCHC: 33.7 g/dL (ref 30.0–36.0)
MCV: 94.8 fL (ref 78.0–100.0)
Platelets: 318 10*3/uL (ref 150–400)
RBC: 3.47 MIL/uL — ABNORMAL LOW (ref 3.87–5.11)
RDW: 14.8 % (ref 11.5–15.5)
WBC: 15.7 10*3/uL — ABNORMAL HIGH (ref 4.0–10.5)

## 2013-08-04 MED ORDER — FENTANYL CITRATE 0.05 MG/ML IJ SOLN
INTRAMUSCULAR | Status: AC
  Filled 2013-08-04: qty 2

## 2013-08-04 MED ORDER — SODIUM CHLORIDE 0.9 % IV SOLN
INTRAVENOUS | Status: DC | PRN
  Administered 2013-08-04: 10 mL/h via INTRAVENOUS

## 2013-08-04 MED ORDER — IOHEXOL 300 MG/ML  SOLN
50.0000 mL | Freq: Once | INTRAMUSCULAR | Status: AC | PRN
Start: 2013-08-04 — End: 2013-08-04
  Administered 2013-08-04: 20 mL

## 2013-08-04 MED ORDER — MIDAZOLAM HCL 2 MG/2ML IJ SOLN
INTRAMUSCULAR | Status: DC | PRN
  Administered 2013-08-04: 0.5 mg via INTRAVENOUS

## 2013-08-04 MED ORDER — MIDAZOLAM HCL 2 MG/2ML IJ SOLN
INTRAMUSCULAR | Status: AC
  Filled 2013-08-04: qty 2

## 2013-08-04 MED ORDER — IOHEXOL 300 MG/ML  SOLN
50.0000 mL | Freq: Once | INTRAMUSCULAR | Status: AC | PRN
  Administered 2013-08-04: 12 mL via INTRAVENOUS

## 2013-08-04 MED ORDER — SODIUM CHLORIDE 0.9 % IJ SOLN
INTRAMUSCULAR | Status: AC
  Administered 2013-08-04: 12:00:00
  Filled 2013-08-04: qty 6

## 2013-08-04 MED ORDER — CIPROFLOXACIN IN D5W 400 MG/200ML IV SOLN
400.0000 mg | Freq: Once | INTRAVENOUS | Status: AC
  Administered 2013-08-04: 400 mg via INTRAVENOUS
  Filled 2013-08-04: qty 200

## 2013-08-04 MED ORDER — HYDROCODONE-ACETAMINOPHEN 5-325 MG PO TABS: ORAL_TABLET | ORAL | Status: DC

## 2013-08-04 NOTE — ED Notes (Signed)
Pt was discharged from [facility] yesterday and admitted to [facility] yesterday. Pt was sent here by Dr Alford Highland after learning that pt had blood in nephrostomy tube. Pt has hx of sepsis but no symptoms at this time. Pt alert and has no complaints at this time.

## 2013-08-04 NOTE — ED Provider Notes (Addendum)
CSN: [id number]     Arrival date & time 08/04/13  0840 History   This chart was scribed for Orlie Dakin, MD by Lowella Petties, ED Scribe. The patient was seen in room APA06/APA06. Patient's care was started at 8:53 AM.     Chief Complaint  Patient presents with  . Nephritis   The history is provided by the patient and the nursing home. No language interpreter was used.   HPI Comments: Bridget Ball is a 80 y.o. female who presents to the Emergency Department after blood was discovered in her nephrostomy tube at the [facility]. She reports that she was seen at [facility] by Dr. Alford Highland yesterday for a problem with her bladder. She reports that she feels fine currently and has no complaints. The [facility] reports that the patient has a history of sepsis, but no symptoms at this time.   History obtained from Karalee Height, the nurse who cares for Bridget Ball at the [facility]. She reports that she noted blood in nephrostomy tube this morning. She reported for work at [employer] am. Wilburn Mylar her urine was clear appearing and she contacted the PA that cares for the patient who advised that she send her to the ED.   PCP: Delphina Cahill, MD   Past Medical History  Diagnosis Date  . COPD (chronic obstructive pulmonary disease)   . Arthritis   . Stroke     TIA four years ago  . Depression   . Seizures     x 2 last year due to bleeding to the brain.  . Memory difficulties   . Chronic diarrhea   . Hiatal hernia   . Cancer     Skin cancer   Past Surgical History  Procedure Laterality Date  . Appendectomy    . Skin cancer excision    . Tubal ligation    . Colonoscopy N/A 05/24/2013    Procedure: COLONOSCOPY;  Surgeon: Rogene Houston, MD;  Location: [facility];  Service: Endoscopy;  Laterality: N/A;  100  . Esophagogastroduodenoscopy N/A 05/24/2013    Procedure: ESOPHAGOGASTRODUODENOSCOPY (EGD);  Surgeon: Rogene Houston, MD;  Location: [facility];  Service: Endoscopy;  Laterality:  N/A;   Family History  Problem Relation Age of Onset  . Diabetes Mother    History  Substance Use Topics  . Smoking status: Former Smoker -- 1.00 packs/day for 50 years    Quit date: 11/13/1997  . Smokeless tobacco: Not on file  . Alcohol Use: 4.2 oz/week    7 Glasses of wine per week     Comment: 2 glasses a night wine (none in a year)   OB History   Grav Para Term Preterm Abortions TAB SAB Ect Mult Living                 Review of Systems  Constitutional: Negative.   HENT: Negative.   Respiratory: Negative.   Cardiovascular: Negative.   Gastrointestinal: Negative.   Musculoskeletal: Negative.   Skin: Negative.   Neurological: Negative.   Psychiatric/Behavioral: Negative.    Allergies  Penicillins and Dilaudid  Home Medications   Prior to Admission medications   Medication Sig Start Date End Date Taking? Authorizing Provider  albuterol (PROVENTIL HFA;VENTOLIN HFA) 108 (90 BASE) MCG/ACT inhaler Inhale 2 puffs into the lungs every 6 (six) hours as needed for wheezing or shortness of breath. 08/03/13   Janece Canterbury, MD  antiseptic oral rinse (BIOTENE) LIQD 15 mLs by Mouth Rinse  route 2 (two) times daily. 08/03/13   Janece Canterbury, MD  atorvastatin (LIPITOR) 80 MG tablet Take 1 tablet (80 mg total) by mouth daily at 6 PM. 08/03/13   Janece Canterbury, MD  budesonide (ENTOCORT EC) 3 MG 24 hr capsule Take 1 capsule (3 mg total) by mouth 2 (two) times daily. 07/12/13   Nita Sells, MD  budesonide-formoterol (SYMBICORT) 160-4.5 MCG/ACT inhaler Inhale 2 puffs into the lungs 2 (two) times daily.    Historical Provider, MD  cefTRIAXone 1 g in dextrose 5 % 50 mL Inject 1 g into the vein daily. 08/03/13   Janece Canterbury, MD  citalopram (CELEXA) 10 MG tablet TAKE 1 TABLET (10 MG TOTAL) BY MOUTH DAILY. 07/24/13   Antony Contras, MD  docusate sodium (COLACE) 100 MG capsule Take one capsule a day unless diarrhea begins 07/21/13   Maudry Diego, MD  feeding supplement, ENSURE  COMPLETE, (ENSURE COMPLETE) LIQD Take 237 mLs by mouth 3 (three) times daily between meals. 08/03/13   Janece Canterbury, MD  fluconazole (DIFLUCAN) 100 MG tablet Take 1 tablet (100 mg total) by mouth daily. 08/03/13   Janece Canterbury, MD  furosemide (LASIX) 20 MG tablet Take 1 tablet (20 mg total) by mouth every other day. 08/03/13   Janece Canterbury, MD  HYDROcodone-acetaminophen (NORCO/VICODIN) 5-325 MG per tablet Take one tablet by mouth every 6-8 hours as needed for pain 08/03/13   Tiffany L Reed, DO  levETIRAcetam (KEPPRA) 250 MG tablet Take 1 tablet (250 mg total) by mouth 2 (two) times daily. 08/03/13   Janece Canterbury, MD  lidocaine (XYLOCAINE) 2 % solution Use as directed 15 mLs in the mouth or throat every 6 (six) hours as needed for mouth pain. 08/03/13   Janece Canterbury, MD  LORazepam (ATIVAN) 0.5 MG tablet Take 1 tablet (0.5 mg total) by mouth 3 (three) times daily. 08/03/13   Tiffany L Reed, DO  Melatonin 5 MG TABS Take 5 mg by mouth at bedtime.    Historical Provider, MD  nystatin (MYCOSTATIN) 100000 UNIT/ML suspension Take 5 mLs (500,000 Units total) by mouth 4 (four) times daily. 08/03/13   Janece Canterbury, MD  pantoprazole (PROTONIX) 40 MG tablet Take 1 tablet (40 mg total) by mouth daily before breakfast. 05/24/13   Rogene Houston, MD  potassium chloride SA (K-DUR,KLOR-CON) 20 MEQ tablet Take 1 tablet (20 mEq total) by mouth daily. 08/03/13   Janece Canterbury, MD  tiotropium (SPIRIVA) 18 MCG inhalation capsule Place 1 capsule (18 mcg total) into inhaler and inhale daily. 08/03/13   Janece Canterbury, MD  Vancomycin (VANCOCIN) 750 MG/150ML SOLN Inject 150 mLs (750 mg total) into the vein daily. 08/03/13   Janece Canterbury, MD   BP 113/71  Pulse 73  Temp(Src) 98 F (36.7 C) (Oral)  Resp 26  SpO2 96% Physical Exam  Nursing note and vitals reviewed. Constitutional: She appears well-developed and well-nourished.  HENT:  Head: Normocephalic and atraumatic.  Eyes: Conjunctivae are normal. Pupils  are equal, round, and reactive to light.  Neck: Neck supple. No tracheal deviation present. No thyromegaly present.  Cardiovascular: Normal rate and regular rhythm.   No murmur heard. Pulmonary/Chest: Effort normal and breath sounds normal.  Abdominal: Soft. Bowel sounds are normal. She exhibits no distension. There is no tenderness.  Genitourinary:  Patient has a nephrostomy tube coming out of her left flank. Dried blood present in the tube.   Musculoskeletal: Normal range of motion. She exhibits no edema and no tenderness.  Neurological: She is alert.  Coordination normal.  Skin: Skin is warm and dry. No rash noted.  Psychiatric: She has a normal mood and affect.    ED Course  Procedures (including critical care time) DIAGNOSTIC STUDIES: Oxygen Saturation is 96% on room air, normal by my interpretation.    COORDINATION OF CARE: 8:58 AM-Discussed treatment plan with pt at bedside and pt agreed to plan.   Labs Review Labs Reviewed - No data to display  Imaging Review No results found.   EKG Interpretation None     Nephrostomy discussed with Dr.Boles. Catheters not in kidney. Results for orders placed during the hospital encounter of 98/92/11  BASIC METABOLIC PANEL      Result Value Ref Range   Sodium 136 (*) 137 - 147 mEq/L   Potassium 4.5  3.7 - 5.3 mEq/L   Chloride 100  96 - 112 mEq/L   CO2 27  19 - 32 mEq/L   Glucose, Bld 99  70 - 99 mg/dL   BUN 15  6 - 23 mg/dL   Creatinine, Ser 0.66  0.50 - 1.10 mg/dL   Calcium 8.8  8.4 - 10.5 mg/dL   GFR calc non Af Amer 80 (*) >90 mL/min   GFR calc Af Amer >90  >90 mL/min   Anion gap 9  5 - 15  CBC      Result Value Ref Range   WBC 15.7 (*) 4.0 - 10.5 K/uL   RBC 3.47 (*) 3.87 - 5.11 MIL/uL   Hemoglobin 11.1 (*) 12.0 - 15.0 g/dL   HCT 32.9 (*) 36.0 - 46.0 %   MCV 94.8  78.0 - 100.0 fL   MCH 32.0  26.0 - 34.0 pg   MCHC 33.7  30.0 - 36.0 g/dL   RDW 14.8  11.5 - 15.5 %   Platelets 318  150 - 400 K/uL   Ct Abdomen Pelvis Wo  Contrast  07/25/2013   CLINICAL DATA:  Nausea, vomiting and diarrhea.  EXAM: CT ABDOMEN AND PELVIS WITHOUT CONTRAST  TECHNIQUE: Multidetector CT imaging of the abdomen and pelvis was performed following the standard protocol without IV contrast.  COMPARISON:  07/21/2013.  FINDINGS: The lung bases are stable.  Patchy bibasilar atelectasis.  The liver is grossly normal and stable. Small cysts are again demonstrated. Cholelithiasis is noted. The spleen is normal in size. No focal lesions. The pancreas is grossly normal.  Severe left-sided hydronephrosis due to a chronic UPJ obstruction. No obstructing ureteral calculus. There is persistent contrast in the left kidney from the recent CT scan. The right kidney is normal.  The bladder is unremarkable. Uterus and ovaries are unremarkable and stable. No mesenteric or retroperitoneal mass or adenopathy. Stable scattered lymph nodes. Stable advanced atherosclerotic calcifications involving the aorta and branch vessels.  No pelvic mass or adenopathy.  No inguinal mass or adenopathy.  The bony structures are stable.  IMPRESSION: Stable left-sided UPJ obstruction with severe hydronephrosis.  Chololithiasis.  Bibasilar atelectasis.  Stable hepatic cysts.   Electronically Signed   By: Kalman Jewels M.D.   On: 07/25/2013 14:20   Dg Chest 1 View  07/28/2013   CLINICAL DATA:  Weakness.  Edema.  EXAM: CHEST - 1 VIEW  COMPARISON:  07/27/2013  FINDINGS: There is irregular interstitial thickening, which appears minimally increased from the previous day's study. Basilar coarse reticular and discoid opacity, likely atelectasis, has increased and there are small effusions which have increased from the prior exam. No focal lung consolidation. No other change.  IMPRESSION: Pulmonary edema  with mild worsening since the previous day's study.   Electronically Signed   By: Lajean Manes M.D.   On: 07/28/2013 09:03   Dg Chest 2 View  07/25/2013   CLINICAL DATA:  Severe abdominal pain   EXAM: CHEST  2 VIEW  COMPARISON:  None.  FINDINGS: The lungs are hyperinflated likely secondary to COPD. There is no focal parenchymal opacity, pleural effusion, or pneumothorax. The heart and mediastinal contours are unremarkable. There is thoracic aortic atherosclerosis.  The osseous structures are unremarkable.  IMPRESSION: No active cardiopulmonary disease.   Electronically Signed   By: Kathreen Devoid   On: 07/25/2013 13:51   Dg Chest 2 View  07/08/2013   CLINICAL DATA:  Chest abdominal pain.  Weakness.  EXAM: CHEST  2 VIEW  COMPARISON:  None.  FINDINGS: The heart is normal in size. There is tortuosity, ectasia and calcification of the thoracic aorta. The pulmonary hila appear normal. The lungs demonstrate emphysematous changes. No acute pulmonary findings. No pleural effusion. The bony thorax is intact.  IMPRESSION: Emphysematous changes but no acute pulmonary findings.   Electronically Signed   By: Kalman Jewels M.D.   On: 07/08/2013 15:13   Dg Shoulder Right  07/30/2013   CLINICAL DATA:  Pain for 1 week duration  EXAM: RIGHT SHOULDER - 2+ VIEW  COMPARISON:  None.  FINDINGS: Frontal and Y scapular images were obtained. There is mild narrowing of the glenohumeral joint. No fracture or dislocation. No erosive change or intra-articular calcification.  IMPRESSION: Mild osteoarthritic change in the glenohumeral joint. No fracture or dislocation.   Electronically Signed   By: Lowella Grip M.D.   On: 07/30/2013 09:36   Dg Abd 1 View  07/26/2013   CLINICAL DATA:  Abdominal distention.  EXAM: ABDOMEN - 1 VIEW  COMPARISON:  None.  FINDINGS: Supine abdomen shows gas scattered along a nondilated colon. There is no gaseous small bowel dilatation. Probe overlies the lower midline pelvis. Bones are demineralized with degenerative changes noted in the lower lumbar spine.  IMPRESSION: No evidence for bowel obstruction although there is diffuse gaseous filling of the colon and a component of underlying mild colonic  ileus would be a consideration.   Electronically Signed   By: Misty Stanley M.D.   On: 07/26/2013 19:06   Ct Angio Chest Pe W/cm &/or Wo Cm  07/28/2013   CLINICAL DATA:  Shortness of breath.  Hypoxia.  Elevated D-dimer.  EXAM: CT ANGIOGRAPHY CHEST WITH CONTRAST  TECHNIQUE: Multidetector CT imaging of the chest was performed using the standard protocol during bolus administration of intravenous contrast. Multiplanar CT image reconstructions and MIPs were obtained to evaluate the vascular anatomy.  CONTRAST: 137mL OMNIPAQUE IOHEXOL 350 MG/ML IV.  COMPARISON:  None.  FINDINGS: Contrast opacification of the pulmonary arteries is good. Respiratory motion blurred images throughout the chest. Overall, the study is of moderate to good diagnostic quality.  No filling defects within either main pulmonary artery or their branches in either lung to suggest pulmonary embolism. Heart enlarged with mild left ventricular enlargement and right atrial enlargement. Minimal fluid in the superior pericardial recess; no significant pericardial effusion. Prominent epicardial fat. Extensive 3 vessel coronary atherosclerosis. Moderate to severe atherosclerosis involving the thoracic and upper abdominal aorta without aneurysm or dissection.  Severe emphysematous changes throughout both lungs. Mild diffuse interstitial pulmonary edema as noted on the portable chest x-ray earlier same date. Moderate bilateral pleural effusions, right greater than left, and associated dense passive atelectasis in the lower lobes.  Central airways patent with calcified tracheobronchial cartilages.  No significant mediastinal, hilar, or axillary lymphadenopathy. Nodule in the thyroid isthmus measuring approximately 1.3 cm. Diffuse body wall edema.  Reflux of contrast into the intrahepatic IVC and right hepatic vein. 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 Calcified and noncalcified plaque at  the origin of the SMA with possible stenosis. Visualized upper abdomen otherwise unremarkable for the early arterial phase of enhancement. Bone window images demonstrate degenerative changes in the lower thoracic spine and exaggeration of the usual thoracic kyphosis.  Review of the MIP images confirms the above findings.  IMPRESSION: 1. No evidence of pulmonary embolism. 2. COPD/emphysema. Mild CHF with mild diffuse interstitial pulmonary edema. 3. Bilateral pleural effusions, right greater than left, with associated passive atelectasis in the lower lobes. 4. Cardiomegaly with left ventricular enlargement and right atrial enlargement. Reflux of contrast into the intrahepatic IVC and right hepatic vein is consistent with right heart failure and/or tricuspid regurgitation. Three vessel coronary atherosclerosis. 5. Severe COPD/emphysema. 6. 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 7. 1.3 cm nodule in the thyroid isthmus. No further imaging followup is felt necessary. This follows ACR consensus guidelines: Managing Incidental Thyroid Nodules Detected on Imaging: White Paper of the ACR Incidental Thyroid Findings Committee. J Am Coll Radiol 2015; 12:143-150.   Electronically Signed   By: Evangeline Dakin M.D.   On: 07/28/2013 17:23   Ct Angio Abdomen W/cm &/or Wo Contrast  07/10/2013   CLINICAL DATA:  Ischemic colitis.  EXAM: CT ANGIOGRAPHY ABDOMEN  TECHNIQUE: Multidetector CT imaging of the abdomen was performed using the standard protocol during bolus administration of intravenous contrast. Multiplanar reconstructed images including MIPs were obtained and reviewed to evaluate the vascular anatomy.  CONTRAST:  177mL OMNIPAQUE IOHEXOL 350 MG/ML SOLN  COMPARISON:  CT abdomen and pelvis 07/08/2013  FINDINGS: A small right pleural effusion is present. Mild dependent atelectasis is evident bilaterally. Multiple hepatic cysts are again noted. No focal parenchymal lesions are present. The spleen is  within normal limits. The stomach and duodenum are within normal limits. Calcifications are again noted within the head of the pancreas and may be within the pancreatic duct. The common bile duct is dilated, measuring 10 mm. No obstructing lesion is present. Multiple layering stones are present within the gallbladder. There is no focal inflammation. Previously-seen dilation of the left renal collecting system is resolved. The kidneys are unremarkable.  Dense atherosclerotic calcifications are present in the abdominal aorta. Arterial phase imaging demonstrates atherosclerotic calcifications at the origin of the mesenteric vessels without focal stenosis. The superior mesenteric artery and celiac artery are intact. No definite stenosis is present at the inferior mesenteric artery. The proximal iliac arteries are within normal limits. Bone windows demonstrate a vacuum disc at multiple levels. Rightward curvature of the thoracolumbar spine is evident.  Review of the MIP images confirms the above findings.  IMPRESSION: 1. Atherosclerotic changes without focal stenosis of the mesenteric arteries or renal arteries. 2. Improved appearance of the colon suggesting resolving colitis. Inflammatory changes are still seen in the mesentery. 3. Chololithiasis without evidence for cholecystitis. 4. Resolution of left sided hydronephrosis.   Electronically Signed   By: Lawrence Santiago M.D.   On: 07/10/2013 15:33   Ct Abdomen Pelvis W Contrast  07/21/2013   CLINICAL DATA:  Abdominal pain.  EXAM: CT ABDOMEN AND PELVIS WITH CONTRAST  TECHNIQUE: Multidetector CT imaging of the abdomen and  pelvis was performed using the standard protocol following bolus administration of intravenous contrast.  CONTRAST:  81mL OMNIPAQUE IOHEXOL 300 MG/ML SOLN, 155mL OMNIPAQUE IOHEXOL 300 MG/ML SOLN  COMPARISON:  CT scan of July 10, 2013.  FINDINGS: Multilevel degenerative disc disease of the lumbar spine is noted. Visualized lung bases appear normal.   Cholelithiasis is noted without inflammation. Multiple hepatic cysts are noted. The spleen and pancreas appear normal. Adrenal glands appear normal. Right kidney appears normal. Severe left hydronephrosis is noted without definite ureteral dilatation or obstructing calculus. This is concerning for ureteropelvic junction stenosis. Atherosclerotic calcifications abdominal aorta are noted without aneurysm formation. Large amount of stool is noted throughout the colon which may represent constipation. No abnormal fluid collection is noted. Urinary bladder appears normal. No significant adenopathy is noted.  IMPRESSION: Stable hepatic cysts.  Stool is noted throughout the colon concerning for constipation.  Cholelithiasis without inflammation.  Interval development of severe left hydronephrosis concerning for ureteropelvic junction stenosis. No obstructing calculus is noted.   Electronically Signed   By: Sabino Dick M.D.   On: 07/21/2013 20:49   Ct Abdomen Pelvis W Contrast  07/08/2013   CLINICAL DATA:  Mid abdominal pain radiating into the back. Nausea and vomiting. Diarrhea.  EXAM: CT ABDOMEN AND PELVIS WITH CONTRAST  TECHNIQUE: Multidetector CT imaging of the abdomen and pelvis was performed using the standard protocol following bolus administration of intravenous contrast.  CONTRAST:  173mL OMNIPAQUE IOHEXOL 300 MG/ML IV. Oral contrast was also administered.  COMPARISON:  None.  FINDINGS: Wall thickening involving the cecum, ascending colon and proximal and mid transverse colon, associated with hyperemia. No abnormal fluid collection to suggest abscess. Interposition of the hepatic flexure of the colon between the liver and the right hemidiaphragm. No ascites. Large stool burden throughout the remainder of the colon. Scattered sigmoid colon diverticula without evidence of acute diverticulitis. Normal appearing stomach and small bowel.  Multiple small low-attenuation lesions throughout the liver, the largest  approximating 8 mm in the medial segment left lobe. The largest lesions represent simple cyst, and I suspect that the smaller ones are cysts as well, given their conspicuity for their small size. Normal spleen. Borderline pancreatic ductal dilation, as there is a stone within the pancreatic duct in the head of the pancreas. A calcification is also present within the pancreatic head parenchyma. Numerous gallstones are present within the gallbladder, including several in the gallbladder neck. Extrahepatic bile duct upper normal in caliber approximating 8 mm. High attenuation material in the distal common bile duct at the ampulla, likely sludge. No CT evidence for acute cholecystitis.  Severe left hydronephrosis without associated ureteral dilation. Delayed excretion of contrast by the left kidney. Numerous small cortical cysts involving the left kidney. Normal-appearing right kidney. Extensive aortoiliofemoral atherosclerosis without aneurysm. No significant lymphadenopathy.  Urinary bladder unremarkable. Uterus atrophic consistent with age. No adnexal masses or free pelvic fluid. Numerous pelvic phleboliths.  Bone window images demonstrate osseous demineralization, degenerative changes throughout the lumbar spine, degenerative changes involving the sacroiliac joints. Visualized lung bases clear apart from minimal scarring in the lower lobes. Emphysematous changes are present.  IMPRESSION: 1. Colitis involving the cecum, ascending colon, and proximal and mid transverse colon. No evidence of abscess. 2. Severe left hydronephrosis without associated ureteral dilation. Mild obstruction of the left kidney with delayed contrast excretion. 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 3. Cholelithiasis without CT evidence of acute cholecystitis. 4. Borderline extrahepatic biliary ductal dilation with  probable sludge in the distal common bile duct. 5. Chronic calcific pancreatitis  involving the pancreatic head, including a stone in the pancreatic duct. 6. Sigmoid colon diverticulosis without evidence of acute diverticulitis. 7. Numerous hepatic lesions felt to represent simple cysts, given their conspicuity for their small size. The largest lesions are simple cysts. 8. Emphysematous changes in the lower lobes.   Electronically Signed   By: Evangeline Dakin M.D.   On: 07/08/2013 15:19   Ir Perc Nephrostomy Left  07/27/2013   CLINICAL DATA:  Left hydronephrosis and sepsis.  EXAM: 1. ULTRASOUND GUIDANCE FOR PUNCTURE OF THE LEFT RENAL COLLECTING SYSTEM. 2. LEFT PERCUTANEOUS NEPHROSTOMY TUBE PLACEMENT.  COMPARISON:  CT of the abdomen on 07/25/2013.  ANESTHESIA/SEDATION: 0.5 mg IV Versed; 25 mcg IV Fentanyl.  Total Moderate Sedation Time  15 minutes  CONTRAST:  5 ml Omnipaque 300  MEDICATIONS: No additional medications.  FLUOROSCOPY TIME:  36 seconds.  PROCEDURE: The procedure, risks, benefits, and alternatives were explained to the patient's daughter. Questions regarding the procedure were encouraged and answered. The patient's daughter understands and consents to the procedure.  The left flank region was prepped with Betadine in a sterile fashion, and a sterile drape was applied covering the operative field. A sterile gown and sterile gloves were used for the procedure. Local anesthesia was provided with 1% Lidocaine.  Ultrasound was used to localize the left kidney. Under direct ultrasound guidance, an 18 gauge needle was advanced into the renal collecting system. Ultrasound image documentation was performed. Aspiration of urine sample was performed followed by contrast injection.  A guidewire was advanced into the collecting system through the needle. Percutaneous tract dilatation was then performed over the guidewire. A 10 French percutaneous nephrostomy tube was then advanced and formed in the collecting system. Catheter position was confirmed by fluoroscopy after contrast injection.  The  catheter was secured at the skin with a Prolene retention suture and Stat-Lock device. A gravity bag was placed.  COMPLICATIONS: None.  FINDINGS: Ultrasound demonstrates severe left-sided hydronephrosis. Urine return was cloudy. A sample was sent for culture. The nephrostomy tube was formed at the level of the renal pelvis.  IMPRESSION: Placement of left percutaneous nephrostomy tube with 10 French tube advanced to the level of the renal pelvis. Urine return is cloudy and infected appearing. A sample of urine from the left kidney was sent for culture analysis.   Electronically Signed   By: Aletta Edouard M.D.   On: 07/27/2013 07:37   Ir US Guide Vasc Access Left  07/27/2013   CLINICAL DATA:  Left hydronephrosis and sepsis.  EXAM: 1. ULTRASOUND GUIDANCE FOR PUNCTURE OF THE LEFT RENAL COLLECTING SYSTEM. 2. LEFT PERCUTANEOUS NEPHROSTOMY TUBE PLACEMENT.  COMPARISON:  CT of the abdomen on 07/25/2013.  ANESTHESIA/SEDATION: 0.5 mg IV Versed; 25 mcg IV Fentanyl.  Total Moderate Sedation Time  15 minutes  CONTRAST:  5 ml Omnipaque 300  MEDICATIONS: No additional medications.  FLUOROSCOPY TIME:  36 seconds.  PROCEDURE: The procedure, risks, benefits, and alternatives were explained to the patient's daughter. Questions regarding the procedure were encouraged and answered. The patient's daughter understands and consents to the procedure.  The left flank region was prepped with Betadine in a sterile fashion, and a sterile drape was applied covering the operative field. A sterile gown and sterile gloves were used for the procedure. Local anesthesia was provided with 1% Lidocaine.  Ultrasound was used to localize the left kidney. Under direct ultrasound guidance, an 18 gauge needle was advanced into  the renal collecting system. Ultrasound image documentation was performed. Aspiration of urine sample was performed followed by contrast injection.  A guidewire was advanced into the collecting system through the needle.  Percutaneous tract dilatation was then performed over the guidewire. A 10 French percutaneous nephrostomy tube was then advanced and formed in the collecting system. Catheter position was confirmed by fluoroscopy after contrast injection.  The catheter was secured at the skin with a Prolene retention suture and Stat-Lock device. A gravity bag was placed.  COMPLICATIONS: None.  FINDINGS: Ultrasound demonstrates severe left-sided hydronephrosis. Urine return was cloudy. A sample was sent for culture. The nephrostomy tube was formed at the level of the renal pelvis.  IMPRESSION: Placement of left percutaneous nephrostomy tube with 10 French tube advanced to the level of the renal pelvis. Urine return is cloudy and infected appearing. A sample of urine from the left kidney was sent for culture analysis.   Electronically Signed   By: Aletta Edouard M.D.   On: 07/27/2013 07:37   Dg Chest Port 1 View  07/27/2013   CLINICAL DATA:  Worsening shortness of breath.  EXAM: PORTABLE CHEST - 1 VIEW  COMPARISON:  Chest radiograph performed 07/26/2013  FINDINGS: The lungs are well-aerated. Vascular congestion is noted, with peribronchial thickening. Mildly increased interstitial markings are again seen, possibly reflecting minimal interstitial edema. No pleural effusion or pneumothorax is identified.  The cardiomediastinal silhouette is within normal limits. No acute osseous abnormalities are seen.  IMPRESSION: Vascular congestion, with peribronchial thickening. Mildly increased interstitial markings may reflect minimal interstitial edema.   Electronically Signed   By: Garald Balding M.D.   On: 07/27/2013 00:54   Dg Chest Port 1 View  07/26/2013   CLINICAL DATA:  Shortness of breath with chest pain and tachycardia. Fever. History of COPD and seizures.  EXAM: PORTABLE CHEST - 1 VIEW  COMPARISON:  Radiographs 07/25/2013 and 07/08/2013.  FINDINGS: 1751 hr. The heart size and mediastinal contours are stable. Accentuated  interstitial markings compared with the prior studies may be related to portable technique, although mild superimposed edema is difficult to exclude. There is no airspace disease, pleural effusion or pneumothorax. The osseous structures appear unchanged.  IMPRESSION: Mild accentuation of interstitial markings, likely technical. No definite acute findings.   Electronically Signed   By: Camie Patience M.D.   On: 07/26/2013 18:06   Dg Nephrostogram Left  08/04/2013   CLINICAL DATA:  Bleeding from recently placed LEFT nephrostomy tube  EXAM: LEFT NEPHROSTOGRAM  TECHNIQUE: Patient's indwelling LEFT nephrostomy tube was injected with 12 cc of a 1:1 combination of Omnipaque 300 and sterile saline. Multiple fluoroscopic images were obtained.  CONTRAST:  70mL OMNIPAQUE IOHEXOL 300 MG/ML  SOLN  FLUOROSCOPY TIME:  1 min 42 seconds  COMPARISON:  07/26/2013 ; correlation CT abdomen and pelvis 07/25/2012  FINDINGS: Scout image demonstrates no residual contrast at the LEFT kidney.  Pigtail LEFT percutaneous nephrostomy tube is partially uncoiled and appears lateral to the expected position of the LEFT renal pelvis/ collecting system.  With contrast injection, the superficial portions of the nephrostomy tube track are opacified with spillage of contrast at the skin surface onto dressings.  Deep portions of the nephrostomy tube track do not opacify.  LEFT renal collecting system di dnot opacify.  Findings are compatible with dislodgement of the percutaneous LEFT nephrostomy tube from the LEFT kidney into the nephrostomy tube track.  IMPRESSION: Dislodgement of LEFT nephrostomy tube from LEFT renal collecting system into the nephrostomy tube track.  Nephrostomy tube was subsequently removed and a dressing was placed.   Electronically Signed   By: Lavonia Dana M.D.   On: 08/04/2013 12:29    MDM  Spoke with Dr.Dahlstedt, in light of patient's recent discharge from the intensive care unit he feels that nephrostomy tube should be  replaced today.Dr. Thornton Papas is making arrangements at [facility] interventional radiology department to replace nephrostomy tube. 115 pm pt resting comfortably Final diagnoses:  None   Diagnosis complication of nephrostomy tube    I personally performed the services described in this documentation, which was scribed in my presence. The recorded information has been reviewed and considered.   Orlie Dakin, MD 08/04/13 [place], MD 08/04/13 [place], MD 08/04/13 1321

## 2013-08-04 NOTE — Procedures (Signed)
L PCN No comp

## 2013-08-04 NOTE — Progress Notes (Signed)
IR aware of dislodged (L)PCN. Per Dr. Diona Fanti, needs to be replaced today. Will have [facility] bring pt to [facility] for new (L)PCN placement.  Ascencion Dike PA-C Interventional Radiology 08/04/2013 1:17 PM

## 2013-08-04 NOTE — Discharge Instructions (Signed)
Percutaneous Nephrostomy, Care After °Refer to this sheet in the next few weeks. These instructions provide you with information on caring for yourself after your procedure. Your health care provider may also give you more specific instructions. Your treatment has been planned according to current medical practices, but problems sometimes occur. Call your health care provider if you have any problems or questions after your procedure. °WHAT TO EXPECT AFTER THE PROCEDURE °You will need to remain lying down for several hours. °HOME CARE INSTRUCTIONS °· Your nephrostomy tube is connected to a leg bag or bedside drainage bag. Always keep the tubing, the leg bag, or the bedside drainage bags below the level of the kidney so that the urine drains freely. °· During the day, if you are connecting the nephrostomy tube to a leg bag, be sure there are no kinks in the tubing and that the urine is draining freely. °· At night, you may want to connect the nephrostomy tube or the leg bag to a larger bedside drainage bag. °· Change the dressing as often as directed by your health care provider, or if it becomes wet. °¨ Gently remove the tapes and dressing from around the nephrostomy tube. Be careful not to pull on the tube while removing the dressing. °¨ Wash the skin around the tube, rinse well, and dry. °¨ Place two split drain sponges in and around the tube exit site. °¨ Place tape around edge of the dressing. °¨ Secure the nephrostomy tubing. Remember to make certain that the nephrostomy tube does not kink or become pinched closed. It can be useful to wrap any exposed tubing going from the nephrostomy tube to any of the connecting tubes to either the leg bag or drainage bag with an elastic bandage. °· Every three weeks, replace the leg bag, drainage bag, and any extension tubing connected to your nephrostomy tube. Your health care provider will explain how to change the drainage bag and extension tubing. °SEEK MEDICAL CARE  IF: °· You experience any problems with any of the valves or tubing. °· You have persistent pain or soreness in your back. °· You have a fever or chills. °SEEK IMMEDIATE MEDICAL CARE IF: °· You have abdominal pain during the first week. °· You have a new appearance of blood in your urine. °· You have back pain that is not relieved by your medicine. °· You have drainage, redness, swelling, or pain at the tube insertion site. °· You have decreased urine output. °· Your nephrostomy tube comes out. °Document Released: 08/15/2003 Document Revised: 05/08/2013 Document Reviewed: 08/18/2012 °Exit[employer]® Patient Information ©2015 [employer]. This information is not intended to replace advice given to you by your health care provider. Make sure you discuss any questions you have with your health care provider. ° °

## 2013-08-04 NOTE — H&P (Signed)
 HPI: Bridget Ball is an 80 y.o. female who was recently admitted at [facility] for sepsis. She had left hydronephrosis and had a left PCN placed on 7/22. She slowly improved and was discharged on 7/30. At the time, her (L)PCN was functioning well. However, last night, it was noted at the [facility] that there was some bloody output and concern that the tube had become dislodged. A nephrostogram was performed at [facility] confirming that the tube was no longer in the kidney. She has now been transported here to [facility] for replacement of the (L)PCN. Chart, PMHx, meds, labs reviewed.  Past Medical History:  Past Medical History  Diagnosis Date  . COPD (chronic obstructive pulmonary disease)   . Arthritis   . Stroke     TIA four years ago  . Depression   . Seizures     x 2 last year due to bleeding to the brain.  . Memory difficulties   . Chronic diarrhea   . Hiatal hernia   . Cancer     Skin cancer    Past Surgical History:  Past Surgical History  Procedure Laterality Date  . Appendectomy    . Skin cancer excision    . Tubal ligation    . Colonoscopy N/A 05/24/2013    Procedure: COLONOSCOPY;  Surgeon: Najeeb U Rehman, MD;  Location: [facility];  Service: Endoscopy;  Laterality: N/A;  100  . Esophagogastroduodenoscopy N/A 05/24/2013    Procedure: ESOPHAGOGASTRODUODENOSCOPY (EGD);  Surgeon: Najeeb U Rehman, MD;  Location: [facility];  Service: Endoscopy;  Laterality: N/A;    Family History:  Family History  Problem Relation Age of Onset  . Diabetes Mother     Social History:  reports that she quit smoking about 15 years ago. She does not have any smokeless tobacco history on file. She reports that she drinks about 4.2 ounces of alcohol per week. She reports that she does not use illicit drugs.  Allergies:  Allergies  Allergen Reactions  . Penicillins Anaphylaxis    Throat swelling as a child. Tolerated ceftriaxone 07/29/13.  . Dilaudid [Hydromorphone Hcl]     Severe sedation per son     Medications: See med rec, no antiocoagulant use.  Please HPI for pertinent positives, otherwise complete 10 system ROS negative.  Physical Exam: Temp: 99, HR: 80, RR: 23, BP: 97/61   General Appearance:  Alert, cooperative, no distress, appears stated age  Head:  Normocephalic, without obvious abnormality, atraumatic  ENT: Unremarkable  Neck: Supple, symmetrical, trachea midline  Lungs:   Clear to auscultation bilaterally, no w/r/r, respirations unlabored without use of accessory muscles.  Heart:  Regular rate and rhythm, S1, S2 normal, no murmur, rub or gallop.  Abdomen:   Soft, non-tender, non distended.  Neurologic: Normal affect, no gross deficits.   Results for orders placed during the hospital encounter of 08/04/13 (from the past 48 hour(s))  BASIC METABOLIC PANEL     Status: Abnormal   Collection Time    08/04/13  9:01 AM      Result Value Ref Range   Sodium 136 (*) 137 - 147 mEq/L   Potassium 4.5  3.7 - 5.3 mEq/L   Chloride 100  96 - 112 mEq/L   CO2 27  19 - 32 mEq/L   Glucose, Bld 99  70 - 99 mg/dL   BUN 15  6 - 23 mg/dL   Creatinine, Ser 0.66  0.50 - 1.10 mg/dL   Calcium 8.8  8.4 -   10.5 mg/dL   GFR calc non Af Amer 80 (*) >90 mL/min   GFR calc Af Amer >90  >90 mL/min   Comment: (NOTE)     The eGFR has been calculated using the CKD EPI equation.     This calculation has not been validated in all clinical situations.     eGFR's persistently <90 mL/min signify possible Chronic Kidney     Disease.   Anion gap 9  5 - 15  CBC     Status: Abnormal   Collection Time    08/04/13  9:01 AM      Result Value Ref Range   WBC 15.7 (*) 4.0 - 10.5 K/uL   RBC 3.47 (*) 3.87 - 5.11 MIL/uL   Hemoglobin 11.1 (*) 12.0 - 15.0 g/dL   HCT 32.9 (*) 36.0 - 46.0 %   MCV 94.8  78.0 - 100.0 fL   MCH 32.0  26.0 - 34.0 pg   MCHC 33.7  30.0 - 36.0 g/dL   RDW 14.8  11.5 - 15.5 %   Platelets 318  150 - 400 K/uL   Dg Nephrostogram Left  08/04/2013   CLINICAL DATA:  Bleeding from  recently placed LEFT nephrostomy tube  EXAM: LEFT NEPHROSTOGRAM  TECHNIQUE: Patient's indwelling LEFT nephrostomy tube was injected with 12 cc of a 1:1 combination of Omnipaque 300 and sterile saline. Multiple fluoroscopic images were obtained.  CONTRAST:  12mL OMNIPAQUE IOHEXOL 300 MG/ML  SOLN  FLUOROSCOPY TIME:  1 min 42 seconds  COMPARISON:  07/26/2013 ; correlation CT abdomen and pelvis 07/25/2012  FINDINGS: Scout image demonstrates no residual contrast at the LEFT kidney.  Pigtail LEFT percutaneous nephrostomy tube is partially uncoiled and appears lateral to the expected position of the LEFT renal pelvis/ collecting system.  With contrast injection, the superficial portions of the nephrostomy tube track are opacified with spillage of contrast at the skin surface onto dressings.  Deep portions of the nephrostomy tube track do not opacify.  LEFT renal collecting system di dnot opacify.  Findings are compatible with dislodgement of the percutaneous LEFT nephrostomy tube from the LEFT kidney into the nephrostomy tube track.  IMPRESSION: Dislodgement of LEFT nephrostomy tube from LEFT renal collecting system into the nephrostomy tube track.  Nephrostomy tube was subsequently removed and a dressing was placed.   Electronically Signed   By: Mark  Boles M.D.   On: 08/04/2013 12:29    Assessment/Plan Recent sepsis and (L)hydronephrosis S/p (L)PCN 7/22 which has now become dislodged. Plan for replacement today. Discussed procedure, risks, complications, use of sedation. IV Cipro 400mg X 1 Consent signed in chart  ,  PA-C 08/04/2013, 2:48 PM     

## 2013-08-04 NOTE — Telephone Encounter (Signed)
[facility] 

## 2013-08-05 LAB — CULTURE, BLOOD (ROUTINE X 2): Culture: NO GROWTH

## 2013-08-06 ENCOUNTER — Ambulatory Visit (HOSPITAL_COMMUNITY)
Admission: RE | Admit: 2013-08-06 | Discharge: 2013-08-06 | Disposition: A | Discharge status: Home / Self Care | Payer: Medicare Other | Source: Ambulatory Visit | Attending: Internal Medicine | Admitting: Internal Medicine

## 2013-08-06 ENCOUNTER — Non-Acute Institutional Stay (SKILLED_NURSING_FACILITY): Payer: Medicare Other | Admitting: Internal Medicine

## 2013-08-06 ENCOUNTER — Encounter: Payer: Self-pay | Admitting: Internal Medicine

## 2013-08-06 ENCOUNTER — Other Ambulatory Visit (HOSPITAL_BASED_OUTPATIENT_CLINIC_OR_DEPARTMENT_OTHER): Payer: Self-pay | Admitting: Internal Medicine

## 2013-08-06 DIAGNOSIS — I509 Heart failure, unspecified: Secondary | ICD-10-CM

## 2013-08-06 DIAGNOSIS — R52 Pain, unspecified: Secondary | ICD-10-CM

## 2013-08-06 DIAGNOSIS — M25559 Pain in unspecified hip: Secondary | ICD-10-CM | POA: Diagnosis present

## 2013-08-06 DIAGNOSIS — A419 Sepsis, unspecified organism: Secondary | ICD-10-CM

## 2013-08-06 DIAGNOSIS — N179 Acute kidney failure, unspecified: Secondary | ICD-10-CM

## 2013-08-06 DIAGNOSIS — B370 Candidal stomatitis: Secondary | ICD-10-CM

## 2013-08-06 DIAGNOSIS — I5030 Unspecified diastolic (congestive) heart failure: Secondary | ICD-10-CM

## 2013-08-06 DIAGNOSIS — N1330 Unspecified hydronephrosis: Secondary | ICD-10-CM

## 2013-08-06 NOTE — Progress Notes (Signed)
Patient ID: Bridget Ball, female   DOB: January 11, 1928, 80 y.o.   MRN: [id number]   This is an acute visit.  Level of care skilled.  Facility [facility].  Chief complaint-acute visit status post hospitalization for hydronephrosis-septic shock with septicemia.  History of present illness.  Patient is a pleasant 80 year old female with a history of COPD CVA and seizure disorder-he came to the ER with diarrhea lethargy and poor appetite as well as abdominal pain and dehydration.  CT scan showed hydronephrosis-she was sent to [facility] for consultation when she developed septic shock and required an ICU visit.  Septic shock was due to septicemia with Proteus mirabilis and enterococcus-with Proteus mirabilis UTI-she was initially on vancomycin Levaquin and Aztreonam---she underwent a TTE which showed no evidence of vegetation-blood cultures from July 26 and 28 showed no growth-infectious disease recommended 4 weeks of vancomycin 3 weeks of Rocephin.  She did receive a PICC line.  In regards to hydronephrosis she underwent a nephrostomy tube placement on the left-this will need to be followed up by urology.  Her creatinine did peak at 1.7 and then trended down to 0.67 by discharge.  She had acute respiratory failure secondary to septic shock CT of the chest was negative for any embolism-this was complicated with first history of COPD also possibly some mild acute on chronic diastolic CHF secondary to fluid resuscitation-she was diuresed with Lasix her breathing improved she continues on Lasix low dose every other day.  She appears to have a diagnosis also was a non-ST MI type II with mild elevation of her troponin-thought secondary to demand ischemia because of the septic shock she had no chest pain a cardiac echo showed normal systolic ejection fraction 55-60% and grade 1 diastolic dysfunction.  She also has thrombocytopenia-felt secondary possibly to mild DC from sepsis versus  consumption-her INR and fibrinogen were elevated which suggested it was not DIC related.  She received 2 units of platelets on July 24-.  She does have a history of seizure disorder thought secondary to subarachnoid hemorrhage she was seizure free during first day.  She also had apparently some mild colitis then improved with decreased diarrhea  She also continues on fluconazole apparently for some significant thrush in the hospital.  She is here for rehabilitation actually shortly after her arrival here late last week a nephrostomy tube apparently had significant amount of blood discharge and her tube was replaced and she returned to facility apparently late Friday.  Previous medical history.  History of hydronephrosis status post nephrostomy tube.  Septic shock secondary to septicemia-currently on IV antibiotic term.  Acute kidney injury this appears to have improved.  Non-ST MI2-demand ischemia.  COPD.  Anemia of chronic disease.  Colitis.  History seizure disorder  Grade 1 diastolic CHF.  Thrush..  Thrombocytopenia.  Previous surgical history significant for previous appendectomy tubal tubal ligation and skin cancer excision.  Medications.  Albuterol inhaler 2 puffs every 6 hours when necessary.  Lipitor 80 mg daily.  Entocort EC one capsule twice a day.  Symbicort 2 puffs twice a day.  Rocephin 1 g IV daily.  Celexa 10 mg daily.  Colace 100 mg daily.  Diflucan 100 mg daily until August 22.  Lasix 20 mg every other day.  Vicodin 5/325 mg one tablet every 6 hours when necessary pain.  Keppra 250 mg twice a day.  Ativan 0.5 mg 3 times a day.  Melatonin 5 mg each bedtime.  Lopressor 12.5 mg twice a day.  Mycostatin 5 mL 4 times a day.  Protonix 40 mg daily.  Potassium 20 mEq a day.  Spiriva inhaler daily.  Vancomycin 750 mg IV daily.       Family medical social history reviewed per discharge note on 08/07/13-of note parents are  deceased there is some history of diabetes in the family including her mother.  Review of systems.  In general no complaints of fever or chills continues to have a somewhat poor appetite.  Skin is not complaining of rashes or itching has a small bruise left lower abdominal superior to hip area.  Eyes does not complaining of visual changes.  Ear nose mouth and throat-does have thrush this appears to be slowly resolving her mouth pain is improved.  Respiratory does not complain of shortness of breath or cough she does have a history of COPD.  Cardiac no complaints of pain or edema.  GI no nausea vomiting diarrhea apparently has improved had colitis in the hospital --t at times will complain of lower abdominal left  superior hip pain this is more with movement  GU-significant issues as noted above but does not think dysuria has nephrostomy tube on the left that is draining amber colored urine.  Muscle skeletal does not complaining of acute joint pain although there is some discomfort in the left upper  hip area with movement apparently this occurred more after therapy.  Neurologic does not complaining of dizziness headache or syncopal-type feelings.  Psych alert and oriented pleasant and appropriate.  Physical exam.  Temperature is 98.5 pulse 86 respirations 18 blood pressure taken manually 104/60-weight 95.2.  In general this is a frail elderly female in no distress sitting comfortably in bed.  Her skin is warm and dry with numerous solar induced changes all extremities and face.  Eyes pupils appear reactive to light sclera and conjunctiva are clear she has prescription lenses visual acuity appears grossly intact.  Oropharynx still has some residual evidence of thrush on her tongue and oropharynx-mucous membranes are moist tongue is midline.  Chest she has shallow air entry but no labored breathing no overt congestion.  Heart is regular rate and rhythm without murmur gallop or  rub.  Abdomen is soft does not appear to be acutely tender although there is some tenderness with deep palpation lateral left abdomen upper hip area-there is no distention bowel sounds are positive.  GU vaginal area does not appear to have a rash or any discharge she does have a nephrostomy tube placed left low back that is draining amber colored urine.  Muscle skeletal has general frailty is able to move all extremities x4 did not note any deformities other than arthritic with flexion of the left hip there is some pain more in the superior hip lower left abdominal area.  Neurologic is grossly intact cranial nerves intact her speech is clear.  Psych she appears mostly alert oriented pleasant and appropriate.  Labs.  08/04/2013.  Sodium 136 potassium 4.5 BUN 15 creatinine 0.66.  WBC 15.7 hemoglobin 11.1 platelets 318  Aug 07, 2013.  Sodium 134 potassium 4.9 BUN 19 creatinine 0.67.  WBC 15.4 hemoglobin 10.7 platelets 267.  07/27/2013.  Albumin 1.8-AST 62--ALT 49--alkaline phosphatase 174.  Assessment and plan.  #1-history of septicemia she continues on vancomycin which is monitored by pharmacy as well as Rocephin-at this point appears clinically to be stable we'll need extensive therapy she continues to be quite weak.  #2-history of hydronephrosis she does have a nephrostomy tube in place this is followed by  urology this was replaced weight last week-at this point she appears to be tolerating this well she has followup scheduled with urology.  #4-YJEHUDJ of diastolic CHF-she is on Lasix every other day-we'll order daily weights notify provider gain greater than 3 pounds clinically this appears stable no significant edema today.  #4 history of oral thrush-she is on Diflucan-this appears to be resolving.  #5-history of acute kidney injury labs appeared to normalize prior to discharge we'll update this tomorrow.  #6-leukocytosis this was thought to be secondary to underlying  infection will update CBC tomorrow.  #7 history of anemia of chronic disease hemoglobin appears to have been stable recently will update this tomorrow as well.  #8 past history seizure disorder apparently this has been stable for some time She is on Keppra.  #9-history of thrombocytopenia this appears to have resolved again will update a lab tomorrow  History COPD-she continues on bronchodilators respiratory status at this point appears to be stable.  Number -11- history of some mild left abdominal upper hip discomfort-will order an x-ray of the area-this could be from muscle strain from therapy but would like to get an x-ray.  Continue to monitor for any changes.  [id number] note greater than 45 minutes spent assessing patient-reviewing her chart-and coordinating and formulating a plan of care for numerous diagnoses-of note greater than 50% of time spent coordinating plan of care  .

## 2013-08-07 ENCOUNTER — Other Ambulatory Visit: Payer: Self-pay | Admitting: *Deleted

## 2013-08-07 LAB — CULTURE, BLOOD (ROUTINE X 2): CULTURE: NO GROWTH

## 2013-08-07 MED ORDER — LORAZEPAM 0.5 MG PO TABS: ORAL_TABLET | ORAL | Status: DC

## 2013-08-07 NOTE — Telephone Encounter (Signed)
[facility] 

## 2013-08-09 ENCOUNTER — Non-Acute Institutional Stay (SKILLED_NURSING_FACILITY): Payer: Medicare Other | Admitting: Internal Medicine

## 2013-08-09 DIAGNOSIS — A419 Sepsis, unspecified organism: Secondary | ICD-10-CM

## 2013-08-09 DIAGNOSIS — I214 Non-ST elevation (NSTEMI) myocardial infarction: Secondary | ICD-10-CM

## 2013-08-09 DIAGNOSIS — N1330 Unspecified hydronephrosis: Secondary | ICD-10-CM

## 2013-08-09 DIAGNOSIS — J449 Chronic obstructive pulmonary disease, unspecified: Secondary | ICD-10-CM

## 2013-08-09 NOTE — Telephone Encounter (Signed)
Noted  

## 2013-08-10 ENCOUNTER — Ambulatory Visit: Payer: Medicare Other | Admitting: Neurology

## 2013-08-11 DIAGNOSIS — J441 Chronic obstructive pulmonary disease with (acute) exacerbation: Secondary | ICD-10-CM | POA: Insufficient documentation

## 2013-08-11 NOTE — Progress Notes (Signed)
HISTORY & PHYSICAL  DATE: 08/09/2013   FACILITY: [facility]  LEVEL OF CARE: SNF (31)  ALLERGIES:  Allergies  Allergen Reactions  . Penicillins Anaphylaxis    Throat swelling as a child. Tolerated ceftriaxone 07/29/13.  . Dilaudid [Hydromorphone Hcl]     Severe sedation per son    CHIEF COMPLAINT:  Manage septicemia, left hydronephrosis and COPD  HISTORY OF PRESENT ILLNESS: 80 year old Caucasian female was hospitalized secondary to abdominal pain and dehydration. After hospitalization she is admitted to this facility for short-term rehabilitation and IV antibiotics.  SEPTICEMIA: Patient developed septic shock while in the hospital. The septicemia was due to Proteus mirabilis and enterococcus UTI and nephritis. TTE did not show vegetation. Repeat blood cultures have been negative. Infectious disease recommended 4 weeks of vancomycin and 3 weeks of ceftriaxone. She is tolerating the antibiotics without any side effects. She denies fever chills or night sweats.  LEFT HYDRONEPHROSIS: Patient developed left UPJ obstruction with hydronephrosis. She was seen by urology and underwent left percutaneous nephrostomy tube placement. She is supposed to follow up with urologist as an outpatient for nephrostogram imaging.  COPD: the COPD remains stable.  Pt denies sob, cough, wheezing or declining exercise tolerance.  No complications from the medications presently being used.  PAST MEDICAL HISTORY :  Past Medical History  Diagnosis Date  . COPD (chronic obstructive pulmonary disease)   . Arthritis   . Stroke     TIA four years ago  . Depression   . Seizures     x 2 last year due to bleeding to the brain.  . Memory difficulties   . Chronic diarrhea   . Hiatal hernia   . Cancer     Skin cancer    PAST SURGICAL HISTORY: Past Surgical History  Procedure Laterality Date  . Appendectomy    . Skin cancer excision    . Tubal ligation    . Colonoscopy N/A 05/24/2013   Procedure: COLONOSCOPY;  Surgeon: Rogene Houston, MD;  Location: [facility];  Service: Endoscopy;  Laterality: N/A;  100  . Esophagogastroduodenoscopy N/A 05/24/2013    Procedure: ESOPHAGOGASTRODUODENOSCOPY (EGD);  Surgeon: Rogene Houston, MD;  Location: [facility];  Service: Endoscopy;  Laterality: N/A;    SOCIAL HISTORY:  reports that she quit smoking about 15 years ago. She does not have any smokeless tobacco history on file. She reports that she drinks about 4.2 ounces of alcohol per week. She reports that she does not use illicit drugs.  FAMILY HISTORY:  Family History  Problem Relation Age of Onset  . Diabetes Mother     CURRENT MEDICATIONS: Reviewed per MAR/see medication list  REVIEW OF SYSTEMS:  See HPI otherwise 14 point ROS is negative.  PHYSICAL EXAMINATION  VS:  See VS section  GENERAL: no acute distress, thin body habitus EYES: conjunctivae normal, sclerae normal, normal eye lids MOUTH/THROAT: lips without lesions,no lesions in the mouth,tongue is without lesions,uvula elevates in midline NECK: supple, trachea midline, no neck masses, no thyroid tenderness, no thyromegaly LYMPHATICS: no LAN in the neck, no supraclavicular LAN RESPIRATORY: breathing is even & unlabored, BS CTAB CARDIAC: RRR, no murmur,no extra heart sounds, no edema GI:  ABDOMEN: abdomen soft, normal BS, no masses, no tenderness  LIVER/SPLEEN: no hepatomegaly, no splenomegaly MUSCULOSKELETAL: HEAD: normal to inspection  EXTREMITIES: LEFT UPPER EXTREMITY: full range of motion, normal strength & tone RIGHT UPPER EXTREMITY:  full range of motion, normal strength & tone  LEFT LOWER EXTREMITY:  full range of motion, normal strength & tone RIGHT LOWER EXTREMITY:  full range of motion, normal strength & tone PSYCHIATRIC: the patient is alert & oriented to person, affect & behavior appropriate  LABS/RADIOLOGY:  08-07-13 WBC 11.9, hemoglobin 10.5, MCV 96, platelets 364, glucose 108 albumin 2.4  otherwise CMP normal  Labs reviewed: Basic Metabolic Panel:  Recent Labs  07/26/13 1830 07/27/13 0535  08/02/13 0438 08/03/13 0411 08/04/13 0901  Ball 138 135*  < > 134* 134* 136*  K 2.9* 3.5*  < > 4.7 4.9 4.5  CL 102 101  < > 100 99 100  CO2 22 18*  < > 23 23 27   GLUCOSE 85 177*  < > 97 97 99  BUN 30* 27*  < > 16 19 15   CREATININE 1.33* 1.21*  < > 0.75 0.67 0.66  CALCIUM 7.7* 7.8*  < > 8.9 8.6 8.8  MG  --  2.4  --   --   --   --   PHOS  --  2.1*  --   --   --   --   < > = values in this interval not displayed. Liver Function Tests:  Recent Labs  07/25/13 1225 07/26/13 0505 07/27/13 0535  AST 79* 40* 62*  ALT 62* 48* 49*  ALKPHOS 63 76 174*  BILITOT 0.4 0.3 0.7  PROT 5.9* 5.3* 4.9*  ALBUMIN 2.6* 2.2* 1.8*    Recent Labs  07/08/13 1345 07/25/13 1225  LIPASE 29 12   CBC:  Recent Labs  07/08/13 1345  07/21/13 1745 07/25/13 1225  08/02/13 0438 08/03/13 0411 08/04/13 0901  WBC 8.7  < > 10.5 13.9*  < > 14.8* 15.4* 15.7*  NEUTROABS 7.7  --  9.1* 13.3*  --   --   --   --   HGB 14.5  < > 13.1 12.3  < > 11.6* 10.7* 11.1*  HCT 43.3  < > 39.3 35.9*  < > 34.1* 32.5* 32.9*  MCV 97.1  < > 96.3 94.2  < > 95.0 93.7 94.8  PLT 235  < > 265 88*  < > 186 267 318  < > = values in this interval not displayed.  Lipid Panel:  Recent Labs  11/21/12 1100  HDL 62   Cardiac Enzymes:  Recent Labs  07/26/13 1752 07/26/13 1830 07/27/13 0114 07/27/13 0540  CKTOTAL  --  63  --   --   CKMB  --  3.0  --   --   TROPONINI 0.75*  --  0.39* <0.30   CBG:  Recent Labs  11/19/12 1853  GLUCAP 101*    Transthoracic Echocardiography  Patient:    Bridget Ball MR #:       [id number] Study Date: 07/26/2013 Gender:     F Age:        53 Height:     157.5 cm Weight:     46.3 kg BSA:        1.42 m^2 Pt. Status: Room:       [facility]   Bridget Ball  REFERRING    Bridget Ball  ATTENDING    Bridget Ball.  SONOGRAPHER  Bridget Ball, RDCS   ADMITTING    Bridget Ball  PERFORMING   Chmg, Bridget Ball  cc:  ------------------------------------------------------------------- LV EF: 55% -   60%  ------------------------------------------------------------------- Indications:      Dyspnea 786.09.  ------------------------------------------------------------------- History:  PMH:   Transient ischemic attack.  Chronic obstructive pulmonary disease.  ------------------------------------------------------------------- Study Conclusions  - Left ventricle: The cavity size was normal. Wall thickness was   normal. Systolic function was normal. The estimated ejection   fraction was in the range of 55% to 60%. Doppler parameters are   consistent with abnormal left ventricular relaxation (grade 1   diastolic dysfunction). - Aortic valve: There was trivial regurgitation. - Mitral valve: Calcified annulus. - Atrial septum: No defect or patent foramen ovale was identified. - Pulmonary arteries: PA peak pressure: 35 mm Hg (S).  Transthoracic echocardiography.  M-mode, complete 2D, spectral Doppler, and color Doppler.  Birthdate:  Patient birthdate: 01/12/1928.  Age:  Patient is 80 yr old.  Sex:  Gender: female. Height:  Height: 157.5 cm. Height: 62 in.  Weight:  Weight: 46.3 kg. Weight: 101.8 lb.  Body mass index:  BMI: 18.7 kg/m^2.  Body surface area:    BSA: 1.42 m^2.  Blood pressure:     112/65 Patient status:  Inpatient.  Study date:  Study date: 07/26/2013. Study time: 10:50 AM.  Location:  ICU/CCU  -------------------------------------------------------------------  ------------------------------------------------------------------- Left ventricle:  The cavity size was normal. Wall thickness was normal. Systolic function was normal. The estimated ejection fraction was in the range of 55% to 60%. Doppler parameters are consistent with abnormal left ventricular relaxation (grade 1 diastolic  dysfunction).  ------------------------------------------------------------------- Aortic valve:   Mildly calcified leaflets.  Doppler:  There was trivial regurgitation.    Valve area (VTI): 2.29 cm^2. Indexed valve area (VTI): 1.62 cm^2/m^2. Valve area (Vmax): 1.92 cm^2. Indexed valve area (Vmax): 1.35 cm^2/m^2.    Peak gradient (S): 14 mm Hg.  ------------------------------------------------------------------- Aorta:  The aorta was normal, not dilated, and non-diseased.  ------------------------------------------------------------------- Mitral valve:   Calcified annulus.  Doppler:  There was no significant regurgitation.    Peak gradient (D): 3 mm Hg.  ------------------------------------------------------------------- Left atrium:  The atrium was normal in size.  ------------------------------------------------------------------- Atrial septum:  No defect or patent foramen ovale was identified.   ------------------------------------------------------------------- Right ventricle:  The cavity size was normal. Wall thickness was normal. Systolic function was normal.  ------------------------------------------------------------------- Pulmonic valve:    Structurally normal valve.   Cusp separation was normal.  Doppler:  Transvalvular velocity was within the normal range. There was trivial regurgitation.  ------------------------------------------------------------------- Tricuspid valve:   Structurally normal valve.   Leaflet separation was normal.  Doppler:  Transvalvular velocity was within the normal range. There was mild regurgitation.  ------------------------------------------------------------------- Right atrium:  The atrium was normal in size.  ------------------------------------------------------------------- Pericardium:  The pericardium was normal in appearance.  ------------------------------------------------------------------- Systemic veins: Inferior vena  cava: The vessel was dilated. The respirophasic diameter changes were in the normal range (>= 50%).  ------------------------------------------------------------------- Post procedure conclusions Ascending Aorta:  - The aorta was normal, not dilated, and non-diseased.  ------------------------------------------------------------------- Prepared and Electronically Authenticated by  Jenkins Rouge, M.D. 2015-07-22T13:40:17  ------------------------------------------------------------------- Measurements   Left ventricle                            Value          Reference  LV ID, ED, PLAX chordal           (L)     36.8  mm       43 - 52  LV ID, ES, PLAX chordal           (N)     29.7  mm       23 - 38  LV fx shortening, PLAX chordal    (L)     19    %        >=29  LV PW thickness, ED                       11.3  mm       ---------  IVS/LV PW ratio, ED               (N)     0.78           <=1.3  Stroke volume, 2D                         71    ml       ---------  Stroke volume/bsa, 2D                     50    ml/m^2   ---------  LV e&', lateral                            8.3   cm/s     ---------  LV E/e&', lateral                          10.54          ---------  LV e&', medial                             6.9   cm/s     ---------  LV E/e&', medial                           12.68          ---------  LV e&', average                            7.6   cm/s     ---------  LV E/e&', average                          11.51          ---------    Ventricular septum                        Value          Reference  IVS thickness, ED                         8.81  mm       ---------    LVOT                                      Value          Reference  LVOT ID, S                                19    mm       ---------  LVOT area                                 2.84  cm^2     ---------    Aortic valve                              Value          Reference  Aortic valve peak velocity, S              189   cm/s     ---------  Aortic peak gradient, S                   14    mm Hg    ---------  Aortic valve area, VTI                    2.29  cm^2     ---------  Aortic valve area/bsa, VTI                1.62  cm^2/m^2 ---------  Aortic valve area, peak velocity          1.92  cm^2     ---------  Aortic valve area/bsa, peak               1.35  cm^2/m^2 ---------  velocity  Aortic regurg pressure half-time          623   ms       ---------    Aorta                                     Value          Reference  Aortic root ID, ED                        34    mm       ---------    Left atrium                               Value          Reference  LA ID, A-P, ES                            31    mm       ---------  LA ID/bsa, A-P                    (N)     2.19  cm/m^2   <=2.2  LA volume/bsa, S                          27.6  ml/m^2   ---------    Mitral valve                              Value          Reference  Mitral E-wave peak velocity               87.5  cm/s     ---------  Mitral A-wave peak velocity               126   cm/s     ---------  Mitral deceleration time          (H)     254   ms       150 - 230  Mitral peak gradient, D                   3     mm Hg    ---------  Mitral E/A ratio, peak                    0.7            ---------    Pulmonary arteries                        Value          Reference  PA pressure, S, DP                (H)     35    mm Hg    <=30    Tricuspid valve                           Value          Reference  Tricuspid regurg peak velocity            258   cm/s     ---------  Tricuspid peak RV-RA gradient             27    mm Hg    ---------  Tricuspid maximal regurg                  258   cm/s     ---------  velocity, PISA    Systemic veins                            Value          Reference  Estimated CVP                             8     mm Hg    ---------    Right ventricle                           Value          Reference  RV pressure, S, DP                 (H)     35    mm Hg    <=30  RV s&', lateral, S                         13.4  cm/s     ---------  Legend: CT ABDOMEN AND PELVIS WITHOUT CONTRAST   TECHNIQUE: Multidetector CT imaging of the abdomen and pelvis was performed following the standard protocol without IV contrast.   COMPARISON:  07/21/2013.   FINDINGS: The lung bases are stable.  Patchy bibasilar atelectasis.   The liver is grossly normal and stable. Small cysts are again demonstrated. Cholelithiasis is noted. The spleen is normal in size. No focal  lesions. The pancreas is grossly normal.   Severe left-sided hydronephrosis due to a chronic UPJ obstruction. No obstructing ureteral calculus. There is persistent contrast in the left kidney from the recent CT scan. The right kidney is normal.   The bladder is unremarkable. Uterus and ovaries are unremarkable and stable. No mesenteric or retroperitoneal mass or adenopathy. Stable scattered lymph nodes. Stable advanced atherosclerotic calcifications involving the aorta and branch vessels.   No pelvic mass or adenopathy.  No inguinal mass or adenopathy.   The bony structures are stable.   IMPRESSION: Stable left-sided UPJ obstruction with severe hydronephrosis.   Chololithiasis.   Bibasilar atelectasis.   Stable hepatic cysts.     ABDOMEN - 1 VIEW   COMPARISON:  None.   FINDINGS: Supine abdomen shows gas scattered along a nondilated colon. There is no gaseous small bowel dilatation. Probe overlies the lower midline pelvis. Bones are demineralized with degenerative changes noted in the lower lumbar spine.   IMPRESSION: No evidence for bowel obstruction although there is diffuse gaseous filling of the colon and a component of underlying mild colonic ileus would be a consideration.   CHEST - 1 VIEW   COMPARISON:  07/27/2013   FINDINGS: There is irregular interstitial thickening, which appears minimally increased from the previous day's  study. Basilar coarse reticular and discoid opacity, likely atelectasis, has increased and there are small effusions which have increased from the prior exam. No focal lung consolidation. No other change.   IMPRESSION: Pulmonary edema with mild worsening since the previous day's study. CT ANGIOGRAPHY CHEST WITH CONTRAST   TECHNIQUE: Multidetector CT imaging of the chest was performed using the standard protocol during bolus administration of intravenous contrast. Multiplanar CT image reconstructions and MIPs were obtained to evaluate the vascular anatomy.   CONTRAST: 158mL OMNIPAQUE IOHEXOL 350 MG/ML IV.   COMPARISON:  None.   FINDINGS: Contrast opacification of the pulmonary arteries is good. Respiratory motion blurred images throughout the chest. Overall, the study is of moderate to good diagnostic quality.   No filling defects within either main pulmonary artery or their branches in either lung to suggest pulmonary embolism. Heart enlarged with mild left ventricular enlargement and right atrial enlargement. Minimal fluid in the superior pericardial recess; no significant pericardial effusion. Prominent epicardial fat. Extensive 3 vessel coronary atherosclerosis. Moderate to severe atherosclerosis involving the thoracic and upper abdominal aorta without aneurysm or dissection.   Severe emphysematous changes throughout both lungs. Mild diffuse interstitial pulmonary edema as noted on the portable chest x-ray earlier same date. Moderate bilateral pleural effusions, right greater than left, and associated dense passive atelectasis in the lower lobes. Central airways patent with calcified tracheobronchial cartilages.   No significant mediastinal, hilar, or axillary lymphadenopathy. Nodule in the thyroid isthmus measuring approximately 1.3 cm. Diffuse body wall edema.   Reflux of contrast into the intrahepatic IVC and right hepatic vein. Stenosis at the origin of the celiac  artery related to compression by the arcuate ligament of the diaphragm. Calcified and noncalcified plaque at the origin of the SMA with possible stenosis. Visualized upper abdomen otherwise unremarkable for the early arterial phase of enhancement. Bone window images demonstrate degenerative changes in the lower thoracic spine and exaggeration of the usual thoracic kyphosis.   Review of the MIP images confirms the above findings.   IMPRESSION: 1. No evidence of pulmonary embolism. 2. COPD/emphysema. Mild CHF with mild diffuse interstitial pulmonary edema. 3. Bilateral pleural effusions, right greater than left, with associated passive atelectasis in the lower  lobes. 4. Cardiomegaly with left ventricular enlargement and right atrial enlargement. Reflux of contrast into the intrahepatic IVC and right hepatic vein is consistent with right heart failure and/or tricuspid regurgitation. Three vessel coronary atherosclerosis. 5. Severe COPD/emphysema. 6. 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 7. 1.3 cm nodule in the thyroid isthmus. No further imaging followup is felt necessary. This follows ACR consensus guidelines: Managing Incidental Thyroid Nodules Detected on Imaging: White Paper of the ACR Incidental Thyroid Findings Committee. J Am Coll Radiol 2015; 12:143-150.   RIGHT SHOULDER - 2+ VIEW   COMPARISON:  None.   FINDINGS: Frontal and Y scapular images were obtained. There is mild narrowing of the glenohumeral joint. No fracture or dislocation. No erosive change or intra-articular calcification.   IMPRESSION: Mild osteoarthritic change in the glenohumeral joint. No fracture or dislocation.   ASSESSMENT/PLAN:  Septicemia-continue antibiotics per ID Left hydronephrosis- nephrostomy tube in place. Followup urologist as recommended COPD-compensated Non-ST elevation MI-followup with cardiologist as recommended Seizure  disorder-well-controlled Anemia of chronic disease-stable  I have reviewed patient's medical records received at admission/from hospitalization.  CPT CODE: 44628  Journii Nierman Y Kamden Reber, [place] [phone]

## 2013-08-16 ENCOUNTER — Non-Acute Institutional Stay (SKILLED_NURSING_FACILITY): Payer: Medicare Other | Admitting: Internal Medicine

## 2013-08-16 DIAGNOSIS — K219 Gastro-esophageal reflux disease without esophagitis: Secondary | ICD-10-CM

## 2013-08-17 DIAGNOSIS — K219 Gastro-esophageal reflux disease without esophagitis: Secondary | ICD-10-CM | POA: Insufficient documentation

## 2013-08-17 NOTE — Progress Notes (Signed)
Patient ID: Bridget Ball, female   DOB: 10-28-1928, 80 y.o.   MRN: [id number]           PROGRESS NOTE  DATE: 08/16/2013          FACILITY:  [facility]  LEVEL OF CARE: SNF (31)  Acute Visit   CHIEF COMPLAINT:  Manage GERD.    HISTORY OF PRESENT ILLNESS: I was requested by the staff to assess the patient regarding above problem(s):  GERD: pt's GERD is unstable.  Patient is complaining of severe heartburn, not relieved by current Prilosec dose.  Denies abd. Pain, nausea or vomiting.  Currently on a PPI & tolerates it without any adverse reactions.     PAST MEDICAL HISTORY : Reviewed.  No changes/see problem list  CURRENT MEDICATIONS: Reviewed per MAR/see medication list  REVIEW OF SYSTEMS:  GENERAL: no change in appetite, no fatigue, no weight changes, no fever, chills or weakness RESPIRATORY: no cough, SOB, DOE,, wheezing, hemoptysis CARDIAC: no chest pain, edema or palpitations GI: uncontrolled heartburn; no abdominal pain, diarrhea, constipation, nausea or vomiting  PHYSICAL EXAMINATION  VS: see VS section  GENERAL: no acute distress, thin body habitus NECK: supple, trachea midline, no neck masses, no thyroid tenderness, no thyromegaly RESPIRATORY: breathing is even & unlabored, BS CTAB CARDIAC: RRR, no murmur,no extra heart sounds, no edema GI: abdomen soft, normal BS, no masses, no tenderness, no hepatomegaly, no splenomegaly PSYCHIATRIC: the patient is alert & oriented to person, affect & behavior appropriate  ASSESSMENT/PLAN:  GERD.  Uncontrolled.  Increase Prilosec to 20 mg b.i.d.     CPT CODE: 96222            Bridget Ball Y Bridget Ball, Bridget Ball [phone]

## 2013-08-25 ENCOUNTER — Telehealth: Payer: Self-pay | Admitting: Neurology

## 2013-08-25 NOTE — Telephone Encounter (Signed)
Called patient and gave her a  Appointment with Bridget Ball.

## 2013-08-25 NOTE — Telephone Encounter (Signed)
Patient's daughter [name] calling to get a sooner appointment with Dr. Leonie Man than the one scheduled in March, states that they're noticing more short term memory loss and they are afraid she won't make it to her appointment in March. Please return call and advise.

## 2013-08-29 ENCOUNTER — Ambulatory Visit (INDEPENDENT_AMBULATORY_CARE_PROVIDER_SITE_OTHER): Payer: Medicare Other | Admitting: Urology

## 2013-08-29 ENCOUNTER — Other Ambulatory Visit: Payer: Self-pay | Admitting: Urology

## 2013-08-29 DIAGNOSIS — N1330 Unspecified hydronephrosis: Secondary | ICD-10-CM

## 2013-08-29 DIAGNOSIS — N1339 Other hydronephrosis: Secondary | ICD-10-CM

## 2013-08-29 DIAGNOSIS — N10 Acute tubulo-interstitial nephritis: Secondary | ICD-10-CM

## 2013-08-31 ENCOUNTER — Ambulatory Visit (HOSPITAL_COMMUNITY)
Admission: RE | Admit: 2013-08-31 | Discharge: 2013-08-31 | Disposition: A | Discharge status: Home / Self Care | Payer: Medicare Other | Source: Ambulatory Visit | Attending: Urology | Admitting: Urology

## 2013-08-31 DIAGNOSIS — N135 Crossing vessel and stricture of ureter without hydronephrosis: Secondary | ICD-10-CM | POA: Insufficient documentation

## 2013-08-31 DIAGNOSIS — N1339 Other hydronephrosis: Secondary | ICD-10-CM

## 2013-08-31 DIAGNOSIS — N1330 Unspecified hydronephrosis: Secondary | ICD-10-CM | POA: Insufficient documentation

## 2013-09-01 ENCOUNTER — Encounter: Payer: Self-pay | Admitting: Nurse Practitioner

## 2013-09-01 ENCOUNTER — Ambulatory Visit (INDEPENDENT_AMBULATORY_CARE_PROVIDER_SITE_OTHER): Payer: Medicare Other | Admitting: Nurse Practitioner

## 2013-09-01 VITALS — BP 89/55 | HR 92 | Ht 61.0 in | Wt 90.6 lb

## 2013-09-01 DIAGNOSIS — F3289 Other specified depressive episodes: Secondary | ICD-10-CM

## 2013-09-01 DIAGNOSIS — F32A Depression, unspecified: Secondary | ICD-10-CM

## 2013-09-01 DIAGNOSIS — F329 Major depressive disorder, single episode, unspecified: Secondary | ICD-10-CM

## 2013-09-01 DIAGNOSIS — R413 Other amnesia: Secondary | ICD-10-CM

## 2013-09-01 MED ORDER — CEREFOLIN NAC 6-2-600 MG PO TABS: 1.0000 | ORAL_TABLET | Freq: Every day | ORAL | Status: DC

## 2013-09-01 MED ORDER — RIVASTIGMINE 4.6 MG/24HR TD PT24: 4.6000 mg | MEDICATED_PATCH | Freq: Every day | TRANSDERMAL | Status: DC

## 2013-09-01 NOTE — Progress Notes (Signed)
PATIENT: Bridget Ball DOB: 01/14/1928  REASON FOR VISIT: routine follow up for memory loss HISTORY FROM: patient, daughter  HISTORY OF PRESENT ILLNESS: Update 09/01/13 (LL): Since last visit patient started on Celexa and no longer has crying spells or depression symptoms. Short-term memory problems and confusion have persisted. Since last visit she got very sick with colitis, had ureter obstruction requiring nephrostomy tube, and then sepsis with an extended hospitalization. She was discharged to the [facility] in [place], with plans to discharge to daughter's home soon. She is still receiving IV antibiotics. She has had significant weight loss, but is regaining strength at rehabilitation. An intake assessment at the nursing center suggested early dementia and daughter and other family members have noticed problems too. Patient denies any problem with memory other than old age. Daughter would like to get her started on something to help her keep from regressing as much as possible and patient is agreeable to it. Geriatric depression score has improved from 10 at last visit to 1 today. MMSE score dropped 1 point from 27-26. Animal fluency score dropped from 12-10 today. In clock drawing score dropped from 4/4 to 2/4 today. She draws numbers backwards on the clock face and does not add in hands to show the time. She is unable to copy 3-dimensional figure.  Update 04/25/2013 ; She returns for followup of her last visit 3 months ago. She is accompanied by her daughter. The patient has not been taking Celexa consistently on regular basis and she states that she forgets to take the pills at times. She continues to have a mild memory difficulties. She is states that she is under significant stress because she has had diarrhea and stomach upset problems and she plans to have endoscopy soon. She's also lost 15 pounds within the last few months. Patient has reduced the dose of Keppra to 250 twice  daily and she's not had any episodes of sensory disturbance or seizures. She underwent EEG on 02/01/13 which was normal without any epileptiform activity. MRI scan of the brain on 02/10/13 shows remote age hemorrhage and blood products in the right parietal subarachnoid spaces which were improved and persistent right frontal hyperintensity which was also improved compared with previous scan from 11/20/2012. No acute abnormality was noted.   Initial Consult 01/24/2013 ; 66 year Caucasian lady seen for followup following hospital admission on 11/19/12 for subarachnoid hemorrhage and seizures. She presented with recurrent transient episodes of left body paresthesias and difficulty speaking. The episode began in the left arm and gradually over minutes progressed to involve the face and lasted several minutes only. At times she was unable to form words and speak but she could understand what other people were saying. She described this as a feeling of pins and needles but without any impairment of consciousness. She had similar episodes in 2010 when she had a similar localized right frontal convexity subarachnoid hemorrhage. She had had no head trauma or significant headaches or had bumped her head. And at times MRI scan had shown similar findings to the current one which showed localized blood in the right frontal convexity with some diffusion positive changes suggesting microinfarction adjacent. She previously has cerebral catheter angiogram performed which showed no evidence of underlying AVM, aneurysms or vascular abnormalities. She was started on Neurontin for seizures and did well for a while but then gradually decided to stop the medication she did follow up with me for 1 year but subsequently was lost to followup.  She had EEG during the previous admission and 2010 which did not show definite seizure activity.She had a repeat EEG on 11/21/12 which was normal. CT angiogram of the brain on 11/16/14and showed no  evidence of underlying aneurysm or vascular abnormality. Urine drug screen was negative. Patient was started on Keppra initially 500 twice a day subsequent when episodes persisted was increased to thousand twice a day and Depakote ER was added. Patient states she's did well since discharge and has not had recurrent episodes of numbness however she has had confusion and agitation issues and hence has stopped the Depakote and in fact has even reduce the Keppra to 500 twice daily. Patient complains of short-term problems as well as intermittent confusion, balance issues. She forgets how to use a credit card and daughter has to help her in on several occasions. She previously had home so [place] which appear to be healing now and she does see a home care nurse twice a week. She hasn't never been diagnosed in the past with depression or been on any medication for that. She denies headache, focal extremity weakness, numbness or vision problems.   ROS:  14 system review of systems is positive for appetite and activity change, weight change, excessive thirst, black stools, diarrhea, neck swelling, bowel incontinence, easy bruisability and memory loss and all other systems negative    ALLERGIES: Allergies  Allergen Reactions  . Penicillins Anaphylaxis    Throat swelling as a child. Tolerated ceftriaxone 07/29/13.  . Dilaudid [Hydromorphone Hcl]     Severe sedation per son    HOME MEDICATIONS: Outpatient Prescriptions Prior to Visit  Medication Sig Dispense Refill  . albuterol (PROVENTIL HFA;VENTOLIN HFA) 108 (90 BASE) MCG/ACT inhaler Inhale 2 puffs into the lungs every 6 (six) hours as needed for wheezing or shortness of breath.  1 Inhaler  0  . antiseptic oral rinse (BIOTENE) LIQD 15 mLs by Mouth Rinse route 2 (two) times daily.  1000 mL  0  . atorvastatin (LIPITOR) 80 MG tablet Take 1 tablet (80 mg total) by mouth daily at 6 PM.  30 tablet  0  . budesonide (ENTOCORT EC) 3 MG 24 hr capsule Take 1 capsule  (3 mg total) by mouth 2 (two) times daily.  40 capsule  0  . budesonide-formoterol (SYMBICORT) 160-4.5 MCG/ACT inhaler Inhale 2 puffs into the lungs 2 (two) times daily.      . citalopram (CELEXA) 10 MG tablet Take 10 mg by mouth daily.      Marland Kitchen docusate sodium (COLACE) 100 MG capsule Take one capsule a day unless diarrhea begins  30 capsule  0  . feeding supplement, ENSURE COMPLETE, (ENSURE COMPLETE) LIQD Take 237 mLs by mouth 3 (three) times daily between meals.  90 Bottle  0  . HYDROcodone-acetaminophen (NORCO/VICODIN) 5-325 MG per tablet Take one tablet by mouth every 6 hours as needed for pain  120 tablet  0  . levETIRAcetam (KEPPRA) 250 MG tablet Take 1 tablet (250 mg total) by mouth 2 (two) times daily.  60 tablet  0  . LORazepam (ATIVAN) 0.5 MG tablet Take one tablet by mouth at bedtime  30 tablet  5  . Melatonin 5 MG TABS Take 5 mg by mouth at bedtime.      . pantoprazole (PROTONIX) 40 MG tablet Take 1 tablet (40 mg total) by mouth daily before breakfast.  30 tablet  5  . tiotropium (SPIRIVA) 18 MCG inhalation capsule Place 1 capsule (18 mcg total) into inhaler and inhale  daily.  30 capsule  0  . cefTRIAXone 1 g in dextrose 5 % 50 mL Inject 1 g into the vein daily.      . fluconazole (DIFLUCAN) 100 MG tablet Take 1 tablet (100 mg total) by mouth daily.  30 tablet  0  . furosemide (LASIX) 20 MG tablet Take 1 tablet (20 mg total) by mouth every other day.  30 tablet  0  . lidocaine (XYLOCAINE) 2 % solution Use as directed 15 mLs in the mouth or throat every 6 (six) hours as needed for mouth pain.  100 mL  0  . metoprolol tartrate (LOPRESSOR) 25 MG tablet Take 12.5 mg by mouth 2 (two) times daily.      Marland Kitchen nystatin (MYCOSTATIN) 100000 UNIT/ML suspension Take 5 mLs (500,000 Units total) by mouth 4 (four) times daily.  60 mL  0  . potassium chloride SA (K-DUR,KLOR-CON) 20 MEQ tablet Take 1 tablet (20 mEq total) by mouth daily.  30 tablet  0  . Vancomycin (VANCOCIN) 750 MG/150ML SOLN Inject 150 mLs  (750 mg total) into the vein daily.       No facility-administered medications prior to visit.    PHYSICAL EXAM Filed Vitals:   09/01/13 1106  BP: 89/55  Pulse: 92  Height: 5\' 1"  (1.549 m)  Weight: 90 lb 9.6 oz (41.096 kg)   Body mass index is 17.13 kg/(m^2). No exam data present No flowsheet data found.  MMSE - Mini Mental State Exam 09/01/2013 04/25/2013  Orientation to time 2 4  Orientation to Place 5 5  Registration 3 3  Attention/ Calculation 5 5  Recall 3 1  Language- name 2 objects 2 2  Language- repeat 1 1  Language- follow 3 step command 3 3  Language- read & follow direction 1 1  Write a sentence 1 1  Copy design 0 1  Total score 26 27    Physical Exam  General: well developed, frail elderly Caucasian lady, seated, in no evident distress  Head: head normocephalic and atraumatic. Orohparynx benign  Neck: supple with no carotid or supraclavicular bruits  Cardiovascular: regular rate and rhythm, no murmurs  Musculoskeletal: no deformity  Skin: no rash/petichiae  Vascular: Normal pulses all extremities   Neurologic Exam  Mental Status: Awake and fully alert. Oriented to place and time. Recent and remote memory grossly intact. Attention span, concentration and fund of knowledge appropriate. Mood and affect appear normal.  Cranial Nerves: Fundoscopic exam not done. Pupils equal, briskly reactive to light. Extraocular movements full without nystagmus. Visual fields full to confrontation. Hearing intact. Facial sensation intact. Face, tongue, palate moves normally and symmetrically.  Motor: Normal bulk and tone. Normal strength in all tested extremity muscles.  Sensory: intact to light touch in all 4 extremities. Coordination: Rapid alternating movements normal in all extremities. Finger-to-nose and heel-to-shin performed accurately bilaterally.  Gait and Station: Arises from chair without difficulty. Stance is normal. Gait demonstrates normal stride length, unsteady  balance. Not able to heel, toe and tandem walk without difficulty.  Reflexes: 1+ and symmetric. Toes downgoing.   ASSESSMENT:  63 year Caucasian lady with a recurrent right frontal convexity localized tiny subarachnoid hemorrhage with simple partial seizures. Prior history of similar episode in 2010 with cerebral catheter angiogram showing no underlying vascular abnormality. Persistent mild confusion and memory loss likely early Alzheimer's Dementia, as depression has been treated.  PLAN:  I had a long discussion with the patient and daughter with regards to her memory loss and  cognitive difficulties.  She is receptive to try cholinesterase inhibitors now.  We will start Exelon patches, 4.6 mg daily, as this may cause less stomach upset, and Cerefloin-NAC daily as it is usually is well-tolerated. She is to continue Keppra 250 twice daily and advised to Continue Celexa 10 mg daily for depression.  She'll return to for followup in 2 months or call earlier if necessary.   Meds ordered this encounter  Medications  . Methylfol-Methylcob-Acetylcyst (CEREFOLIN NAC) 6-2-600 MG TABS    Sig: Take 1 tablet by mouth daily.    Dispense:  30 each    Refill:  11    Order Specific Question:  Supervising Provider    Answer:  Leonie Man, PRAMOD [[id number]]  . rivastigmine (EXELON) 4.6 mg/24hr    Sig: Place 1 patch (4.6 mg total) onto the skin daily.    Dispense:  30 patch    Refill:  5    Order Specific Question:  Supervising Provider    Answer:  Antony Contras [[id number]]   Return in about 2 months (around 11/01/2013) for memory loss.  Rudi Rummage Obryan Radu, MSN, FNP-BC, A/GNP-C 09/01/2013, 12:31 PM [facility] [address] [place], [place] [address] [phone]  Note: This document was prepared with digital dictation and possible smart phrase technology. Any transcriptional errors that result from this process are unintentional.

## 2013-09-01 NOTE — Patient Instructions (Addendum)
Continue Celexa daily for depression. Start Exelon patches for memory loss, use 1 patch daily, make sure you remove the patch before adding a new one.   Start Cerefolin- NAC tablets, 1 daily for memory loss.  Follow up in 2 months, sooner as needed.

## 2013-09-02 NOTE — Progress Notes (Signed)
I agree with the above plan 

## 2013-09-04 ENCOUNTER — Telehealth: Payer: Self-pay | Admitting: Nurse Practitioner

## 2013-09-04 ENCOUNTER — Other Ambulatory Visit: Payer: Self-pay | Admitting: Urology

## 2013-09-04 ENCOUNTER — Other Ambulatory Visit: Payer: Self-pay | Admitting: *Deleted

## 2013-09-04 ENCOUNTER — Non-Acute Institutional Stay (SKILLED_NURSING_FACILITY): Payer: Medicare Other | Admitting: Internal Medicine

## 2013-09-04 ENCOUNTER — Other Ambulatory Visit: Payer: Self-pay | Admitting: Neurology

## 2013-09-04 DIAGNOSIS — R269 Unspecified abnormalities of gait and mobility: Secondary | ICD-10-CM

## 2013-09-04 DIAGNOSIS — N1330 Unspecified hydronephrosis: Secondary | ICD-10-CM

## 2013-09-04 DIAGNOSIS — J449 Chronic obstructive pulmonary disease, unspecified: Secondary | ICD-10-CM

## 2013-09-04 MED ORDER — HYDROCODONE-ACETAMINOPHEN 5-325 MG PO TABS: ORAL_TABLET | ORAL | Status: DC

## 2013-09-04 NOTE — Telephone Encounter (Signed)
[facility] 

## 2013-09-04 NOTE — Telephone Encounter (Signed)
Patients daughter calling to state she will need this prescription today please.  She has only on pill left.

## 2013-09-04 NOTE — Telephone Encounter (Signed)
Daughter Ann Held @ [phone], stated [facility] pharmacy stated insurance company needs to know why patient is taking medication.  Please return anytime and may leave detailed message on vm.

## 2013-09-04 NOTE — Telephone Encounter (Signed)
I called [facility] to inquire which med they are referring to.  Pharmacy said Exelon is not covered on ins.  I have faxed them a voucher to use for one free month BIN: [id number] PCN: CN Group: [id number] ID: [id number].  I will contact ins and try to get them to grant an exception and cover this drug.  I called Ms Bridget Ball back.  She is aware.  Says if ins denied the med she would like to see if it can be changed to aricept, but will wait to hear from ins regarding response.

## 2013-09-06 ENCOUNTER — Telehealth: Payer: Self-pay

## 2013-09-06 NOTE — Telephone Encounter (Signed)
[employer] notified us they have approved our request for coverage on Exelon effective until 01/04/2014 Ref # [id number]

## 2013-09-10 ENCOUNTER — Ambulatory Visit (HOSPITAL_COMMUNITY)
Admission: RE | Admit: 2013-09-10 | Discharge: 2013-09-10 | Disposition: A | Discharge status: Home / Self Care | Payer: Medicare Other | Source: Ambulatory Visit | Attending: Interventional Radiology | Admitting: Interventional Radiology

## 2013-09-10 ENCOUNTER — Encounter (HOSPITAL_COMMUNITY): Payer: Self-pay | Admitting: Emergency Medicine

## 2013-09-10 ENCOUNTER — Other Ambulatory Visit (HOSPITAL_COMMUNITY): Payer: Self-pay | Admitting: Interventional Radiology

## 2013-09-10 ENCOUNTER — Emergency Department (HOSPITAL_COMMUNITY)
Admission: EM | Admit: 2013-09-10 | Discharge: 2013-09-10 | Disposition: A | Discharge status: Home / Self Care | Payer: Medicare Other | Attending: Emergency Medicine | Admitting: Emergency Medicine

## 2013-09-10 DIAGNOSIS — F329 Major depressive disorder, single episode, unspecified: Secondary | ICD-10-CM | POA: Diagnosis not present

## 2013-09-10 DIAGNOSIS — J449 Chronic obstructive pulmonary disease, unspecified: Secondary | ICD-10-CM | POA: Diagnosis not present

## 2013-09-10 DIAGNOSIS — Z880 Allergy status to penicillin: Secondary | ICD-10-CM | POA: Insufficient documentation

## 2013-09-10 DIAGNOSIS — F3289 Other specified depressive episodes: Secondary | ICD-10-CM | POA: Diagnosis not present

## 2013-09-10 DIAGNOSIS — Z79899 Other long term (current) drug therapy: Secondary | ICD-10-CM | POA: Diagnosis not present

## 2013-09-10 DIAGNOSIS — N1330 Unspecified hydronephrosis: Secondary | ICD-10-CM

## 2013-09-10 DIAGNOSIS — G40909 Epilepsy, unspecified, not intractable, without status epilepticus: Secondary | ICD-10-CM | POA: Insufficient documentation

## 2013-09-10 DIAGNOSIS — Z8673 Personal history of transient ischemic attack (TIA), and cerebral infarction without residual deficits: Secondary | ICD-10-CM | POA: Diagnosis not present

## 2013-09-10 DIAGNOSIS — Z8739 Personal history of other diseases of the musculoskeletal system and connective tissue: Secondary | ICD-10-CM | POA: Diagnosis not present

## 2013-09-10 DIAGNOSIS — Z87891 Personal history of nicotine dependence: Secondary | ICD-10-CM | POA: Diagnosis not present

## 2013-09-10 DIAGNOSIS — IMO0002 Reserved for concepts with insufficient information to code with codable children: Secondary | ICD-10-CM | POA: Insufficient documentation

## 2013-09-10 DIAGNOSIS — Y833 Surgical operation with formation of external stoma as the cause of abnormal reaction of the patient, or of later complication, without mention of misadventure at the time of the procedure: Secondary | ICD-10-CM | POA: Diagnosis not present

## 2013-09-10 DIAGNOSIS — Z85828 Personal history of other malignant neoplasm of skin: Secondary | ICD-10-CM | POA: Diagnosis not present

## 2013-09-10 DIAGNOSIS — Z8719 Personal history of other diseases of the digestive system: Secondary | ICD-10-CM | POA: Diagnosis not present

## 2013-09-10 DIAGNOSIS — F0390 Unspecified dementia without behavioral disturbance: Secondary | ICD-10-CM | POA: Diagnosis not present

## 2013-09-10 DIAGNOSIS — J4489 Other specified chronic obstructive pulmonary disease: Secondary | ICD-10-CM | POA: Insufficient documentation

## 2013-09-10 DIAGNOSIS — T83022A Displacement of nephrostomy catheter, initial encounter: Secondary | ICD-10-CM

## 2013-09-10 DIAGNOSIS — N9989 Other postprocedural complications and disorders of genitourinary system: Secondary | ICD-10-CM | POA: Insufficient documentation

## 2013-09-10 LAB — BASIC METABOLIC PANEL
Anion gap: 10 (ref 5–15)
BUN: 14 mg/dL (ref 6–23)
CALCIUM: 8.8 mg/dL (ref 8.4–10.5)
CO2: 26 mEq/L (ref 19–32)
Chloride: 105 mEq/L (ref 96–112)
Creatinine, Ser: 0.82 mg/dL (ref 0.50–1.10)
GFR calc Af Amer: 75 mL/min — ABNORMAL LOW (ref >90)
GFR, EST NON AFRICAN AMERICAN: 65 mL/min — AB (ref >90)
Glucose, Bld: 89 mg/dL (ref 70–99)
Potassium: 3.9 mEq/L (ref 3.7–5.3)
SODIUM: 141 meq/L (ref 137–147)

## 2013-09-10 LAB — CBC
HEMATOCRIT: 29.6 % — AB (ref 36.0–46.0)
HEMOGLOBIN: 9.7 g/dL — AB (ref 12.0–15.0)
MCH: 31.7 pg (ref 26.0–34.0)
MCHC: 32.8 g/dL (ref 30.0–36.0)
MCV: 96.7 fL (ref 78.0–100.0)
Platelets: 203 10*3/uL (ref 150–400)
RBC: 3.06 MIL/uL — ABNORMAL LOW (ref 3.87–5.11)
RDW: 15.9 % — ABNORMAL HIGH (ref 11.5–15.5)
WBC: 6 10*3/uL (ref 4.0–10.5)

## 2013-09-10 MED ORDER — FENTANYL CITRATE 0.05 MG/ML IJ SOLN
INTRAMUSCULAR | Status: AC
  Filled 2013-09-10: qty 2

## 2013-09-10 MED ORDER — FENTANYL CITRATE 0.05 MG/ML IJ SOLN
INTRAMUSCULAR | Status: AC | PRN
  Administered 2013-09-10 (×2): 50 ug via INTRAVENOUS

## 2013-09-10 MED ORDER — CIPROFLOXACIN IN D5W 400 MG/200ML IV SOLN
400.0000 mg | Freq: Once | INTRAVENOUS | Status: AC
  Administered 2013-09-10: 400 mg via INTRAVENOUS
  Filled 2013-09-10: qty 200

## 2013-09-10 MED ORDER — IOHEXOL 300 MG/ML  SOLN
50.0000 mL | Freq: Once | INTRAMUSCULAR | Status: AC | PRN
  Administered 2013-09-10: 20 mL

## 2013-09-10 MED ORDER — MIDAZOLAM HCL 2 MG/2ML IJ SOLN
INTRAMUSCULAR | Status: AC
  Filled 2013-09-10: qty 2

## 2013-09-10 MED ORDER — LIDOCAINE HCL 1 % IJ SOLN
INTRAMUSCULAR | Status: AC
  Filled 2013-09-10: qty 20

## 2013-09-10 MED ORDER — MIDAZOLAM HCL 2 MG/2ML IJ SOLN
INTRAMUSCULAR | Status: AC | PRN
  Administered 2013-09-10 (×2): 1 mg via INTRAVENOUS

## 2013-09-10 MED ORDER — LIDOCAINE VISCOUS 2 % MT SOLN
OROMUCOSAL | Status: AC
  Filled 2013-09-10: qty 15

## 2013-09-10 NOTE — ED Notes (Signed)
Interventional Radiology responded at this time sent to [phone] to speak with MD.

## 2013-09-10 NOTE — Sedation Documentation (Signed)
Recovering pt at nurses station, IV Cipro infusing. Pts daughter at bedside, will drive pt home. VS stable, discharge instructions given.

## 2013-09-10 NOTE — Procedures (Signed)
L PCN retrieval and exchange No comp

## 2013-09-10 NOTE — ED Notes (Signed)
MD at bedside. 

## 2013-09-10 NOTE — ED Notes (Signed)
Pt's daughter reports pt pulled out nephrostomy tube approx 12 hours ago.    Reports pt has dementia and doesn't remember pulling it out.

## 2013-09-10 NOTE — ED Provider Notes (Signed)
CSN: [id number]     Arrival date & time 09/10/13  1038 History   This chart was scribed for Johnna Acosta, MD, by Neta Ehlers, ED Scribe. This patient was seen in room APA02/APA02 and the patient's care was started at 10:44 AM.  First MD Initiated Contact with Patient 09/10/13 1043     Chief Complaint  Patient presents with  . pulled out nephrostomy tube     The history is provided by the patient and medical records. No language interpreter was used.   Level Five Caveat: Dementia   HPI Comments: Bridget Ball is a 80 y.o. female who was brought to the Emergency Department presenting with a displaced nephrostomy tube. Bridget Ball has a h/o pulling out her nephrostomy tube; she has done it twice in the past several weeks. Per medical records, the pt was admitted five weeks due to gram negative septic shock. She had acute kidney injury and left hydronephrosis. On July 26, 2013 left percutaneous nephrostomy tube placed because of obstruction of UPJ. Her creatines had been as elevated as 1.7; the creatine improved to normal after placement of nephrostomy.   Past Medical History  Diagnosis Date  . COPD (chronic obstructive pulmonary disease)   . Arthritis   . Stroke     TIA four years ago  . Depression   . Seizures     x 2 last year due to bleeding to the brain.  . Memory difficulties   . Chronic diarrhea   . Hiatal hernia   . Cancer     Skin cancer   Past Surgical History  Procedure Laterality Date  . Appendectomy    . Skin cancer excision    . Tubal ligation    . Colonoscopy N/A 05/24/2013    Procedure: COLONOSCOPY;  Surgeon: Rogene Houston, MD;  Location: [facility];  Service: Endoscopy;  Laterality: N/A;  100  . Esophagogastroduodenoscopy N/A 05/24/2013    Procedure: ESOPHAGOGASTRODUODENOSCOPY (EGD);  Surgeon: Rogene Houston, MD;  Location: [facility];  Service: Endoscopy;  Laterality: N/A;   Family History  Problem Relation Age of Onset  . Diabetes Mother     History  Substance Use Topics  . Smoking status: Former Smoker -- 1.00 packs/day for 50 years    Quit date: 11/13/1997  . Smokeless tobacco: Not on file  . Alcohol Use: 4.2 oz/week    7 Glasses of wine per week     Comment: 2 glasses a night wine (none in a year)   No OB history provided.  Review of Systems  Unable to perform ROS: Dementia    Allergies  Penicillins and Dilaudid  Home Medications   Prior to Admission medications   Medication Sig Start Date End Date Taking? Authorizing Provider  albuterol (PROVENTIL HFA;VENTOLIN HFA) 108 (90 BASE) MCG/ACT inhaler Inhale 2 puffs into the lungs every 6 (six) hours as needed for wheezing or shortness of breath. 08/03/13  Yes Janece Canterbury, MD  atorvastatin (LIPITOR) 80 MG tablet Take 1 tablet (80 mg total) by mouth daily at 6 PM. 08/03/13  Yes Janece Canterbury, MD  budesonide (ENTOCORT EC) 3 MG 24 hr capsule Take 1 capsule (3 mg total) by mouth 2 (two) times daily. 07/12/13  Yes Nita Sells, MD  citalopram (CELEXA) 10 MG tablet Take 10 mg by mouth daily.   Yes Historical Provider, MD  docusate sodium (COLACE) 100 MG capsule Take 100 mg by mouth daily as needed for mild constipation. 07/21/13  Yes  Maudry Diego, MD  feeding supplement, ENSURE COMPLETE, (ENSURE COMPLETE) LIQD Take 237 mLs by mouth 3 (three) times daily between meals. 08/03/13  Yes Janece Canterbury, MD  levETIRAcetam (KEPPRA) 250 MG tablet Take 1 tablet (250 mg total) by mouth 2 (two) times daily. 08/03/13  Yes Janece Canterbury, MD  Melatonin 5 MG TABS Take 5 mg by mouth at bedtime.   Yes Historical Provider, MD  pantoprazole (PROTONIX) 40 MG tablet Take 1 tablet (40 mg total) by mouth daily before breakfast. 05/24/13  Yes Rogene Houston, MD  rivastigmine (EXELON) 4.6 mg/24hr Place 1 patch (4.6 mg total) onto the skin daily. 09/01/13  Yes Philmore Pali, NP  tiotropium (SPIRIVA) 18 MCG inhalation capsule Place 1 capsule (18 mcg total) into inhaler and inhale daily. 08/03/13   Yes Janece Canterbury, MD  Methylfol-Methylcob-Acetylcyst (CEREFOLIN NAC) 6-2-600 MG TABS Take 1 tablet by mouth daily. 09/01/13   Philmore Pali, NP   Triage Vitals: BP 108/66  Pulse 89  Temp(Src) 98.4 F (36.9 C) (Oral)  Resp 18  Ht 5\' 1"  (1.549 m)  Wt 98 lb (44.453 kg)  BMI 18.53 kg/m2  SpO2 95%  Physical Exam  Nursing note and vitals reviewed. Constitutional: She appears well-developed and well-nourished. No distress.  HENT:  Head: Normocephalic and atraumatic.  Mouth/Throat: Oropharynx is clear and moist. No oropharyngeal exudate.  Eyes: Conjunctivae and EOM are normal. Pupils are equal, round, and reactive to light. Right eye exhibits no discharge. Left eye exhibits no discharge. No scleral icterus.  Neck: Normal range of motion. Neck supple. No JVD present. No thyromegaly present.  Cardiovascular: Normal rate, regular rhythm, normal heart sounds and intact distal pulses.  Exam reveals no gallop and no friction rub.   No murmur heard. Pulmonary/Chest: Effort normal and breath sounds normal. No respiratory distress. She has no wheezes. She has no rales.  Abdominal: Soft. Bowel sounds are normal. She exhibits no distension and no mass. There is no tenderness.  Musculoskeletal: Normal range of motion. She exhibits no edema and no tenderness.  Absent left nephrostomy in left lower back. Sutures in place. No tube.   Lymphadenopathy:    She has no cervical adenopathy.  Neurological: She is alert. Coordination normal.  Skin: Skin is warm and dry. No rash noted. No erythema.  Psychiatric: She has a normal mood and affect. Her behavior is normal.    ED Course  Procedures (including critical care time)  DIAGNOSTIC STUDIES: Oxygen Saturation is 95% on room air, normal by my interpretation.    COORDINATION OF CARE:  10:52 AM- Discussed treatment plan with patient, and the patient agreed to the plan.   Labs Review Labs Reviewed  CBC - Abnormal; Notable for the following:    RBC 3.06  (*)    Hemoglobin 9.7 (*)    HCT 29.6 (*)    RDW 15.9 (*)    All other components within normal limits  BASIC METABOLIC PANEL - Abnormal; Notable for the following:    GFR calc non Af Amer 65 (*)    GFR calc Af Amer 75 (*)    All other components within normal limits    Imaging Review No results found.    MDM   Final diagnoses:  Nephrostomy tube displaced    Labs unremarkable - pt stable to go to [facility] to have tube replaced.  D/w Dr. Barbie Banner who agrees - family will take patient at this time.  I personally performed the services described in this documentation,  which was scribed in my presence. The recorded information has been reviewed and is accurate.       Johnna Acosta, MD 09/10/13 [phone]

## 2013-09-10 NOTE — ED Notes (Signed)
[facility] called to contact Interventional Radiology

## 2013-09-10 NOTE — ED Notes (Signed)
Lab tech at bedside drawing blood work at this time.

## 2013-09-12 NOTE — Patient Instructions (Addendum)
Bridget Ball  09/12/2013                           YOUR PROCEDURE IS SCHEDULED ON: 09/18/13               ENTER THRU [place] MAIN [facility]ITAL ENTRANCE AND                            FOLLOW  SIGNS TO SHORT STAY CENTER                 ARRIVE AT SHORT STAY AT: 7:00 AM               CALL THIS NUMBER IF ANY PROBLEMS THE DAY OF SURGERY :               [phone]                                REMEMBER:   Do not eat food or drink liquids AFTER MIDNIGHT                  Take these medicines the morning of surgery with               A SIPS OF WATER :     ENTOCORT / CELEXA / KEPPRA / PROTONIX / USE ALBUTEROL INHALER    Do not wear jewelry, make-up   Do not wear lotions, powders, or perfumes.   Do not shave legs or underarms 12 hrs. before surgery (men may shave face)  Do not bring valuables to the hospital.  Contacts, dentures or bridgework may not be worn into surgery.  Leave suitcase in the car. After surgery it may be brought to your room.  For patients admitted to the hospital more than one night, checkout time is            11:00 AM                                                       The day of discharge.   Patients discharged the day of surgery will not be allowed to drive home.            If going home same day of surgery, must have someone stay with you              FIRST 24 hrs at home and arrange for some one to drive you              home from hospital.   ________________________________________________________________________                                                                        [place]  Before surgery, you can play an important role.  Because skin is not sterile, your skin needs to be as free of germs as possible.  You can  reduce the number of germs on your skin by washing with CHG (chlorahexidine gluconate) soap before surgery.  CHG is an antiseptic cleaner which kills germs and bonds with the skin to  continue killing germs even after washing. Please DO NOT use if you have an allergy to CHG or antibacterial soaps.  If your skin becomes reddened/irritated stop using the CHG and inform your nurse when you arrive at Short Stay. Do not shave (including legs and underarms) for at least 48 hours prior to the first CHG shower.  You may shave your face. Please follow these instructions carefully:   1.  Shower with CHG Soap the night before surgery and the  morning of Surgery.   2.  If you choose to wash your hair, wash your hair first as usual with your  normal  Shampoo.   3.  After you shampoo, rinse your hair and body thoroughly to remove the  shampoo.                                         4.  Use CHG as you would any other liquid soap.  You can apply chg directly  to the skin and wash . Gently wash with scrungie or clean wascloth    5.  Apply the CHG Soap to your body ONLY FROM THE NECK DOWN.   Do not use on open                           Wound or open sores. Avoid contact with eyes, ears mouth and genitals (private parts).                        Genitals (private parts) with your normal soap.              6.  Wash thoroughly, paying special attention to the area where your surgery  will be performed.   7.  Thoroughly rinse your body with warm water from the neck down.   8.  DO NOT shower/wash with your normal soap after using and rinsing off  the CHG Soap .                9.  Pat yourself dry with a clean towel.             10.  Wear clean pajamas.             11.  Place clean sheets on your bed the night of your first shower and do not  sleep with pets.  Day of Surgery : Do not apply any lotions/deodorants the morning of surgery.  Please wear clean clothes to the hospital/surgery center.  FAILURE TO FOLLOW THESE INSTRUCTIONS MAY RESULT IN THE CANCELLATION OF YOUR SURGERY    PATIENT  SIGNATURE_________________________________  ______________________________________________________________________

## 2013-09-12 NOTE — Progress Notes (Signed)
Patient ID: Bridget Ball, female   DOB: 1928/12/19, 80 y.o.   MRN: [id number]               PROGRESS NOTE  DATE:  09/04/2013    FACILITY: [facility]    LEVEL OF CARE:   SNF   Acute Visit/Discharge Visit   CHIEF COMPLAINT:  Pre-discharge review.       HISTORY OF PRESENT ILLNESS:  This is a patient who was admitted during my vacation.  I have not seen her previously.  She was admitted with abdominal pain, dehydration, and sepsis.  She had Proteus and Enterococcus UTI as well as acute renal insufficiency.  She required four weeks' worth of vancomycin and three weeks' worth of ceftriaxone.  Her antibiotics have completed.    She was discovered to have left-sided hydronephrosis with a left UPJ obstruction with hydronephrosis.  She underwent placement of a left percutaneous nephrostomy tube, which is still in place and being followed by Nephrology.    Her other medical issues include COPD, some degree of cognitive impairment.  She has been seen by Psychiatry and diagnosed with dementia.  They recommended an Exelon patch.     The patient's insurance has cut off.  There have been meetings with regards to discharge.  She  apparently has a daughter who lives downstairs and another daughter locally, who are supportive.  She will need to go home with a walker, although she is not using that in her room here.    PHYSICAL EXAMINATION:   VITAL SIGNS:   O2 SATURATIONS:  94% on room air.   GENERAL APPEARANCE:  The patient does not look to be in any distress.   CHEST/RESPIRATORY:  Shallow, but otherwise clear air entry.   NEUROLOGICAL:    BALANCE/GAIT:  She is able to bring herself to a standing position.  Her gait is nondescript.  However, her balance is poor.  She is at risk for falling.   PSYCHIATRIC:   MENTAL STATUS:   She is impulsive.  I think she does indeed have memory problems and I would not at all be surprised if she has mild dementia on formal testing.  I agree with the Exelon  patch.    ASSESSMENT/PLAN:  Left hydronephrosis.  Going home with a nephrostomy.  She will need supplies and skilled nursing to follow this.  Family and/or the patient will need to be trained in draining this leg bag that she is currently using.  Again, follow-up with Nephrology.    COPD.  This is significant, but stable.  She is on a complicated list of medications.  Again, the family will need to be involved here.    Balance issues.  I did not see her actually with a walker, but one will be provided for her.  She will need PT, OT, and skilled nursing at home.    There had been recommendations for therapy for 24-hour care.  I do not know that this can easily be provided, although she does have a very supportive family who are going to do the best they can to keep her in her own home which is the patient's firm desire.    CPT CODE: 54562

## 2013-09-13 ENCOUNTER — Encounter (HOSPITAL_COMMUNITY): Payer: Self-pay | Admitting: Pharmacy Technician

## 2013-09-14 ENCOUNTER — Encounter (HOSPITAL_COMMUNITY): Payer: Self-pay

## 2013-09-14 ENCOUNTER — Ambulatory Visit (HOSPITAL_COMMUNITY)
Admission: RE | Admit: 2013-09-14 | Discharge: 2013-09-14 | Disposition: A | Discharge status: Home / Self Care | Payer: Medicare Other | Source: Ambulatory Visit | Attending: Anesthesiology | Admitting: Anesthesiology

## 2013-09-14 ENCOUNTER — Encounter (HOSPITAL_COMMUNITY)
Admission: RE | Admit: 2013-09-14 | Discharge: 2013-09-14 | Disposition: A | Discharge status: Home / Self Care | Payer: Medicare Other | Source: Ambulatory Visit | Attending: Urology | Admitting: Urology

## 2013-09-14 DIAGNOSIS — J438 Other emphysema: Secondary | ICD-10-CM | POA: Insufficient documentation

## 2013-09-14 DIAGNOSIS — Z01818 Encounter for other preprocedural examination: Secondary | ICD-10-CM | POA: Insufficient documentation

## 2013-09-14 DIAGNOSIS — N179 Acute kidney failure, unspecified: Secondary | ICD-10-CM | POA: Diagnosis present

## 2013-09-14 HISTORY — DX: Unspecified hydronephrosis: N13.30

## 2013-09-14 HISTORY — DX: Encounter for attention to other artificial openings of urinary tract: Z43.6

## 2013-09-14 HISTORY — DX: Microscopic colitis, unspecified: K52.839

## 2013-09-14 HISTORY — DX: Noninfective gastroenteritis and colitis, unspecified: K52.9

## 2013-09-14 HISTORY — DX: Shortness of breath: R06.02

## 2013-09-14 HISTORY — DX: Personal history of other infectious and parasitic diseases: Z86.19

## 2013-09-14 HISTORY — DX: Acute myocardial infarction, unspecified: I21.9

## 2013-09-14 HISTORY — DX: Spontaneous ecchymoses: R23.3

## 2013-09-14 HISTORY — DX: Gastro-esophageal reflux disease without esophagitis: K21.9

## 2013-09-14 HISTORY — DX: Hyperlipidemia, unspecified: E78.5

## 2013-09-14 HISTORY — DX: Unspecified dementia, unspecified severity, without behavioral disturbance, psychotic disturbance, mood disturbance, and anxiety: F03.90

## 2013-09-14 HISTORY — DX: Other skin changes: R23.8

## 2013-09-14 HISTORY — DX: Anxiety disorder, unspecified: F41.9

## 2013-09-14 HISTORY — DX: Personal history of other diseases of the respiratory system: Z87.09

## 2013-09-14 LAB — CBC
HCT: 30.2 % — ABNORMAL LOW (ref 36.0–46.0)
Hemoglobin: 9.8 g/dL — ABNORMAL LOW (ref 12.0–15.0)
MCH: 31.3 pg (ref 26.0–34.0)
MCHC: 32.5 g/dL (ref 30.0–36.0)
MCV: 96.5 fL (ref 78.0–100.0)
Platelets: 248 10*3/uL (ref 150–400)
RBC: 3.13 MIL/uL — AB (ref 3.87–5.11)
RDW: 15.8 % — AB (ref 11.5–15.5)
WBC: 6.3 10*3/uL (ref 4.0–10.5)

## 2013-09-14 LAB — BASIC METABOLIC PANEL
Anion gap: 10 (ref 5–15)
BUN: 10 mg/dL (ref 6–23)
CALCIUM: 9.4 mg/dL (ref 8.4–10.5)
CHLORIDE: 103 meq/L (ref 96–112)
CO2: 27 meq/L (ref 19–32)
CREATININE: 0.85 mg/dL (ref 0.50–1.10)
GFR calc non Af Amer: 62 mL/min — ABNORMAL LOW (ref >90)
GFR, EST AFRICAN AMERICAN: 72 mL/min — AB (ref >90)
Glucose, Bld: 86 mg/dL (ref 70–99)
Potassium: 4.1 mEq/L (ref 3.7–5.3)
SODIUM: 140 meq/L (ref 137–147)

## 2013-09-17 MED ORDER — GENTAMICIN SULFATE 40 MG/ML IJ SOLN
230.0000 mg | INTRAVENOUS | Status: AC
  Administered 2013-09-18: 230 mg via INTRAVENOUS
  Filled 2013-09-17: qty 5.75

## 2013-09-17 NOTE — Anesthesia Preprocedure Evaluation (Addendum)
Anesthesia Evaluation  Patient identified by MRN, date of birth, ID band Patient awake    Reviewed: Allergy & Precautions, H&P , NPO status , Patient's Chart, lab work & pertinent test results  History of Anesthesia Complications Negative for: history of anesthetic complications  Airway Mallampati: II TM Distance: >3 FB Neck ROM: Full    Dental no notable dental hx. (+) Edentulous Upper, Edentulous Lower   Pulmonary COPD COPD inhaler, former smoker,  breath sounds clear to auscultation  Pulmonary exam normal       Cardiovascular + Past MI negative cardio ROS  Rhythm:Regular Rate:Normal     Neuro/Psych Seizures -, Well Controlled,  Anxiety Depression  Neuromuscular disease CVA    GI/Hepatic Neg liver ROS, hiatal hernia, GERD-  Medicated,  Endo/Other  negative endocrine ROS  Renal/GU Renal disease  negative genitourinary   Musculoskeletal  (+) Arthritis -,   Abdominal   Peds negative pediatric ROS (+)  Hematology negative hematology ROS (+)   Anesthesia Other Findings   Reproductive/Obstetrics negative OB ROS                          Anesthesia Physical Anesthesia Plan  ASA: III  Anesthesia Plan: General   Post-op Pain Management:    Induction: Intravenous  Airway Management Planned: Oral ETT  Additional Equipment:   Intra-op Plan:   Post-operative Plan: Extubation in OR  Informed Consent: I have reviewed the patients History and Physical, chart, labs and discussed the procedure including the risks, benefits and alternatives for the proposed anesthesia with the patient or authorized representative who has indicated his/her understanding and acceptance.   Dental advisory given  Plan Discussed with: CRNA  Anesthesia Plan Comments:        Anesthesia Quick Evaluation

## 2013-09-18 ENCOUNTER — Encounter (HOSPITAL_COMMUNITY): Payer: Self-pay | Admitting: *Deleted

## 2013-09-18 ENCOUNTER — Ambulatory Visit (HOSPITAL_COMMUNITY): Payer: Medicare Other | Admitting: Anesthesiology

## 2013-09-18 ENCOUNTER — Ambulatory Visit (HOSPITAL_COMMUNITY)
Admission: RE | Admit: 2013-09-18 | Discharge: 2013-09-18 | Disposition: A | Discharge status: Home / Self Care | Payer: Medicare Other | Source: Ambulatory Visit | Attending: Urology | Admitting: Urology

## 2013-09-18 ENCOUNTER — Encounter (HOSPITAL_COMMUNITY): Payer: Medicare Other | Admitting: Anesthesiology

## 2013-09-18 ENCOUNTER — Ambulatory Visit (HOSPITAL_COMMUNITY): Payer: Medicare Other

## 2013-09-18 ENCOUNTER — Encounter (HOSPITAL_COMMUNITY)
Admission: RE | Disposition: A | Discharge status: Home / Self Care | Payer: Self-pay | Source: Ambulatory Visit | Attending: Urology

## 2013-09-18 DIAGNOSIS — F411 Generalized anxiety disorder: Secondary | ICD-10-CM | POA: Insufficient documentation

## 2013-09-18 DIAGNOSIS — J4489 Other specified chronic obstructive pulmonary disease: Secondary | ICD-10-CM | POA: Insufficient documentation

## 2013-09-18 DIAGNOSIS — J449 Chronic obstructive pulmonary disease, unspecified: Secondary | ICD-10-CM | POA: Insufficient documentation

## 2013-09-18 DIAGNOSIS — Z8673 Personal history of transient ischemic attack (TIA), and cerebral infarction without residual deficits: Secondary | ICD-10-CM | POA: Diagnosis not present

## 2013-09-18 DIAGNOSIS — F0390 Unspecified dementia without behavioral disturbance: Secondary | ICD-10-CM | POA: Diagnosis not present

## 2013-09-18 DIAGNOSIS — E785 Hyperlipidemia, unspecified: Secondary | ICD-10-CM | POA: Diagnosis not present

## 2013-09-18 DIAGNOSIS — N1330 Unspecified hydronephrosis: Secondary | ICD-10-CM | POA: Insufficient documentation

## 2013-09-18 DIAGNOSIS — Z87891 Personal history of nicotine dependence: Secondary | ICD-10-CM | POA: Insufficient documentation

## 2013-09-18 DIAGNOSIS — Z936 Other artificial openings of urinary tract status: Secondary | ICD-10-CM | POA: Insufficient documentation

## 2013-09-18 DIAGNOSIS — F329 Major depressive disorder, single episode, unspecified: Secondary | ICD-10-CM | POA: Diagnosis not present

## 2013-09-18 DIAGNOSIS — K449 Diaphragmatic hernia without obstruction or gangrene: Secondary | ICD-10-CM | POA: Insufficient documentation

## 2013-09-18 DIAGNOSIS — F3289 Other specified depressive episodes: Secondary | ICD-10-CM | POA: Diagnosis not present

## 2013-09-18 DIAGNOSIS — K219 Gastro-esophageal reflux disease without esophagitis: Secondary | ICD-10-CM | POA: Diagnosis not present

## 2013-09-18 DIAGNOSIS — I252 Old myocardial infarction: Secondary | ICD-10-CM | POA: Diagnosis not present

## 2013-09-18 HISTORY — PX: CYSTOSCOPY WITH RETROGRADE PYELOGRAM, URETEROSCOPY AND STENT PLACEMENT: SHX5789

## 2013-09-18 SURGERY — CYSTOURETEROSCOPY, WITH RETROGRADE PYELOGRAM AND STENT INSERTION
Anesthesia: General | Laterality: Left

## 2013-09-18 MED ORDER — LIDOCAINE HCL (CARDIAC) 20 MG/ML IV SOLN
INTRAVENOUS | Status: AC
  Filled 2013-09-18: qty 5

## 2013-09-18 MED ORDER — ONDANSETRON HCL 4 MG/2ML IJ SOLN
INTRAMUSCULAR | Status: DC | PRN
  Administered 2013-09-18: 4 mg via INTRAVENOUS

## 2013-09-18 MED ORDER — SODIUM CHLORIDE 0.9 % IV SOLN
250.0000 mL | INTRAVENOUS | Status: DC | PRN
Start: 2013-09-18 — End: 2013-09-18

## 2013-09-18 MED ORDER — FENTANYL CITRATE 0.05 MG/ML IJ SOLN
INTRAMUSCULAR | Status: DC | PRN
  Administered 2013-09-18: 25 ug via INTRAVENOUS

## 2013-09-18 MED ORDER — SODIUM CHLORIDE 0.9 % IJ SOLN: 3.0000 mL | Freq: Two times a day (BID) | INTRAMUSCULAR | Status: DC

## 2013-09-18 MED ORDER — FENTANYL CITRATE 0.05 MG/ML IJ SOLN
INTRAMUSCULAR | Status: AC
  Filled 2013-09-18: qty 2

## 2013-09-18 MED ORDER — LACTATED RINGERS IV SOLN
INTRAVENOUS | Status: DC
  Administered 2013-09-18: 1000 mL via INTRAVENOUS

## 2013-09-18 MED ORDER — PROPOFOL 10 MG/ML IV BOLUS
INTRAVENOUS | Status: DC | PRN
  Administered 2013-09-18: 120 mg via INTRAVENOUS

## 2013-09-18 MED ORDER — IOHEXOL 300 MG/ML  SOLN
INTRAMUSCULAR | Status: DC | PRN
  Administered 2013-09-18: 15 mL via URETHRAL

## 2013-09-18 MED ORDER — ACETAMINOPHEN 325 MG PO TABS: 650.0000 mg | ORAL_TABLET | ORAL | Status: DC | PRN

## 2013-09-18 MED ORDER — LEVOFLOXACIN 250 MG PO TABS: 250.0000 mg | ORAL_TABLET | Freq: Every day | ORAL | Status: DC

## 2013-09-18 MED ORDER — PROPOFOL 10 MG/ML IV BOLUS
INTRAVENOUS | Status: AC
  Filled 2013-09-18: qty 20

## 2013-09-18 MED ORDER — SUCCINYLCHOLINE CHLORIDE 20 MG/ML IJ SOLN
INTRAMUSCULAR | Status: DC | PRN
  Administered 2013-09-18: 100 mg via INTRAVENOUS

## 2013-09-18 MED ORDER — PHENYLEPHRINE HCL 10 MG/ML IJ SOLN
INTRAMUSCULAR | Status: DC | PRN
Start: 2013-09-18 — End: 2013-09-18
  Administered 2013-09-18: 80 ug via INTRAVENOUS
  Administered 2013-09-18 (×2): 40 ug via INTRAVENOUS

## 2013-09-18 MED ORDER — SODIUM CHLORIDE 0.9 % IR SOLN
Status: DC | PRN
  Administered 2013-09-18: 3000 mL

## 2013-09-18 MED ORDER — OXYCODONE HCL 5 MG PO TABS: 5.0000 mg | ORAL_TABLET | ORAL | Status: DC | PRN

## 2013-09-18 MED ORDER — SODIUM CHLORIDE 0.9 % IJ SOLN: 3.0000 mL | INTRAMUSCULAR | Status: DC | PRN

## 2013-09-18 MED ORDER — ONDANSETRON HCL 4 MG/2ML IJ SOLN
INTRAMUSCULAR | Status: AC
  Filled 2013-09-18: qty 2

## 2013-09-18 MED ORDER — LIDOCAINE HCL (CARDIAC) 20 MG/ML IV SOLN
INTRAVENOUS | Status: DC | PRN
  Administered 2013-09-18: 100 mg via INTRAVENOUS

## 2013-09-18 MED ORDER — PROMETHAZINE HCL 25 MG/ML IJ SOLN: 6.2500 mg | INTRAMUSCULAR | Status: DC | PRN

## 2013-09-18 MED ORDER — ACETAMINOPHEN 650 MG RE SUPP
650.0000 mg | RECTAL | Status: DC | PRN
  Filled 2013-09-18: qty 1

## 2013-09-18 MED ORDER — FENTANYL CITRATE 0.05 MG/ML IJ SOLN: 25.0000 ug | INTRAMUSCULAR | Status: DC | PRN

## 2013-09-18 SURGICAL SUPPLY — 16 items
BAG URO CATCHER STRL LF (DRAPE) ×3 IMPLANT
CATH INTERMIT  6FR 70CM (CATHETERS) ×3 IMPLANT
CLOTH BEACON ORANGE TIMEOUT ST (SAFETY) ×3 IMPLANT
DRAPE CAMERA CLOSED 9X96 (DRAPES) ×3 IMPLANT
GLOVE BIOGEL M 8.0 STRL (GLOVE) ×7 IMPLANT
GOWN STRL REUS W/TWL XL LVL3 (GOWN DISPOSABLE) ×5 IMPLANT
GUIDEWIRE STR DUAL SENSOR (WIRE) ×3 IMPLANT
MANIFOLD NEPTUNE II (INSTRUMENTS) ×3 IMPLANT
PACK CYSTO (CUSTOM PROCEDURE TRAY) ×3 IMPLANT
PACK LITHOTOMY IV (CUSTOM PROCEDURE TRAY) ×2 IMPLANT
SHEATH ACCESS URETERAL 38CM (SHEATH) ×1 IMPLANT
SHEATH URET ACCESS 12FR/35CM (UROLOGICAL SUPPLIES) ×2 IMPLANT
STENT CONTOUR 7FRX24 (STENTS) ×2 IMPLANT
TUBING CONNECTING 10 (TUBING) ×2 IMPLANT
TUBING CONNECTING 10' (TUBING) ×1
WIRE COONS/BENSON .038X145CM (WIRE) IMPLANT

## 2013-09-18 NOTE — Anesthesia Postprocedure Evaluation (Signed)
  Anesthesia Post-op Note  Patient: Bridget Ball  Procedure(s) Performed: Procedure(s) (LRB): CYSTOSCOPY WITH RETROGRADE PYELOGRAM,  URETEROSCOPY AND  STENT PLACEMENT, removal of nephrostomy tube (Left)  Patient Location: PACU  Anesthesia Type: General  Level of Consciousness: awake and alert   Airway and Oxygen Therapy: Patient Spontanous Breathing  Post-op Pain: mild  Post-op Assessment: Post-op Vital signs reviewed, Patient's Cardiovascular Status Stable, Respiratory Function Stable, Patent Airway and No signs of Nausea or vomiting  Last Vitals:  Filed Vitals:   09/18/13 1015  BP: 104/65  Pulse: 80  Temp:   Resp: 18    Post-op Vital Signs: stable   Complications: No apparent anesthesia complications

## 2013-09-18 NOTE — Progress Notes (Signed)
Pt wearing bilateral knee high support stockings (she says Rx)  Per her request and her daughters, I have left them patent

## 2013-09-18 NOTE — Op Note (Addendum)
PATIENT:  Bridget Ball  PRE-OPERATIVE DIAGNOSIS: Left hydronephrosis  POST-OPERATIVE DIAGNOSIS: Same  PROCEDURE: Cystoscopy, left retrograde ureteropyelogram, interpretive fluoroscopy, left ureteroscopy, placement of double-J stent  SURGEON:  Lillette Boxer. Johnnye Sandford, M.D.  ANESTHESIA:  General  EBL:  Minimal  DRAINS: 24 cm x 6 French contour stent without tether  LOCAL MEDICATIONS USED:  None  SPECIMEN:  None  INDICATION: Bridget Ball is a 80 year old female with recent symptomatic left pyonephrosis. She has significant UPJ obstruction on the left, no neoplastic etiology has been found so far. She has been drained appropriately with her nephrostomy tube. She presents at this time for cystoscopy, left retrograde ureteropyelogram, probable double-J stent placement and ureteroscopy. This is to be performed to evaluate for filling defects/stones/neoplasm at her UPJ, as well as for decompression internally of her left renal unit following nephrostomy tube placement.  Description of procedure: The patient was properly identified and marked (if applicable) in the holding area. They were then  taken to the operating room and placed on the table in a supine position. General anesthesia was then administered. Once fully anesthetized the patient was moved to the dorsolithotomy position and the genitalia and perineum were sterilely prepped and draped in standard fashion. An official timeout was then performed.  A 22 French panendoscope was advanced into her bladder. Inspection revealed a normal urothelium with no tumors trabeculations or foreign bodies. Ureteral orifices were normal in configuration and location.  A 6 French open-ended catheter was advanced to the left ureteral orifice. Retrograde ureteropyelogram was performed. This revealed a totally normal ureter up until the UPJ. At that point, there was "beaking" of the UPJ, but with filling of the renal pelvis. Obvious nephrostomy tube was  curled in the renal pelvis. The renal pelvis filled, and was free of filling defects.  At this point, I passed a sensor tip guidewire through the ureteral catheter, and guided past the UPJ and into the left renal pelvis where curl was seen. I then dilated the distal ureter with the inner core of a ureteral access sheath. I then removed the cystoscope, and passed the ureteroscope through the ureter. There was no evidence of any urothelial abnormality or stricture of the ureter. I negotiated the ureteroscope past the UPJ. No abnormalities were seen. It was felt that this more than likely was a congenital UPJ obstruction. The renal pelvis was inspected with the rigid scope as far as I can see, no abnormalities were noted. The nephrostomy tube was seen curled in the pelvis.  At this point, I removed the ureteroscope, reinspecting the ureter as I removed the scope. Again, the ureter was normal. I then replaced the cystoscope, and over top of the guidewire which was adequately positioned I passed a 6 French, 24 cm contour double-J stent. The string was removed. Adequate proximal and distal curls were seen with fluoroscopic and cystoscopic guidance, respectively. The bladder was drained and the scope removed. I then removed the nephrostomy tube and a dry sterile dressing was placed.  The patient tolerated procedure well. He was awakened and taken to the PACU in stable condition.    PLAN OF CARE: Discharge to home after PACU  PATIENT DISPOSITION:  PACU - hemodynamically stable.

## 2013-09-18 NOTE — Discharge Instructions (Signed)

## 2013-09-18 NOTE — H&P (Signed)
  H&P  Chief Complaint: Blocked left kidney  History of Present Illness: Bridget Ball is a 80 y.o. year old female who presents for cystoscopy, left retrograde ureteropyelogram, possible left ureteroscopy and placement of a left double J stent. She was recently found to have infected pyelonephritis of the left kidney and recently had placement of a left percutaneous nephrostomy tube. She has had clinical improvement.  Past Medical History  Diagnosis Date  . COPD (chronic obstructive pulmonary disease)   . Arthritis   . Depression   . Memory difficulties   . Chronic diarrhea   . Hiatal hernia   . Cancer     Skin cancer  . Hyperlipidemia   . Myocardial infarction     unknown time   . Shortness of breath     with exertion  . Seizures     x 2 last year due to bleeding to the brain.  . Stroke     TIA four years ago / stroke NOV 2014  . Bruises easily   . GERD (gastroesophageal reflux disease)   . Microscopic colitis   . Colitis   . Hydronephrosis, left   . Hx of septic shock JULY 2015  . Dementia   . Anxiety   . Hx of pulmonary edema JULY 2015  . Attention to nephrostomy     PT HAS NEPHROSTOMY TUBE IN PLACE  . Hx of bronchitis   . Hx of pulmonary edema JULY 2015    Past Surgical History  Procedure Laterality Date  . Appendectomy    . Skin cancer excision    . Tubal ligation    . Colonoscopy N/A 05/24/2013    Procedure: COLONOSCOPY;  Surgeon: Rogene Houston, MD;  Location: [facility];  Service: Endoscopy;  Laterality: N/A;  100  . Esophagogastroduodenoscopy N/A 05/24/2013    Procedure: ESOPHAGOGASTRODUODENOSCOPY (EGD);  Surgeon: Rogene Houston, MD;  Location: [facility];  Service: Endoscopy;  Laterality: N/A;  . Tonsillectomy      Home Medications:  No prescriptions prior to admission    Allergies:  Allergies  Allergen Reactions  . Penicillins Anaphylaxis    Throat swelling as a child. Tolerated ceftriaxone 07/29/13.    Family History  Problem Relation  Age of Onset  . Diabetes Mother     Social History:  reports that she quit smoking about 15 years ago. She does not have any smokeless tobacco history on file. She reports that she drinks about 4.2 ounces of alcohol per week. She reports that she does not use illicit drugs.  ROS: A complete review of systems was performed.  All systems are negative except for pertinent findings as noted.  Physical Exam:  Vital signs in last 24 hours:   General:  Alert and oriented, No acute distress HEENT: Normocephalic, atraumatic Neck: No JVD or lymphadenopathy Cardiovascular: Regular rate and rhythm Lungs: Clear bilaterally Abdomen: Soft, nontender, nondistended, no abdominal masses Back: No CVA tenderness Extremities: No edema Neurologic: Grossly intact  Laboratory Data:  No results found for this or any previous visit (from the past 24 hour(s)). No results found for this or any previous visit (from the past 240 hour(s)). Creatinine:  Recent Labs  09/14/13 1525  CREATININE 0.85    Radiologic Imaging: No results found.  Impression/Assessment:  Left hydronephrosis  Plan:  Cystoscopy, left retrograde pyelogram, possible ureteroscopy and placement of left J2 stent  Jorja Loa 09/18/2013, 6:02 AM  Lillette Boxer. Juliann Olesky MD

## 2013-09-18 NOTE — Transfer of Care (Signed)
Immediate Anesthesia Transfer of Care Note  Patient: Bridget Ball  Procedure(s) Performed: Procedure(s) (LRB): CYSTOSCOPY WITH RETROGRADE PYELOGRAM,  URETEROSCOPY AND  STENT PLACEMENT, removal of nephrostomy tube (Left)  Patient Location: PACU  Anesthesia Type: General  Level of Consciousness: sedated, patient cooperative and responds to stimulation  Airway & Oxygen Therapy: Patient Spontanous Breathing and Patient connected to face mask oxgen  Post-op Assessment: Report given to PACU RN and Post -op Vital signs reviewed and stable  Post vital signs: Reviewed and stable  Complications: No apparent anesthesia complications

## 2013-09-19 ENCOUNTER — Encounter (HOSPITAL_COMMUNITY): Payer: Self-pay | Admitting: Urology

## 2013-09-19 ENCOUNTER — Telehealth (INDEPENDENT_AMBULATORY_CARE_PROVIDER_SITE_OTHER): Payer: Self-pay | Admitting: *Deleted

## 2013-09-19 ENCOUNTER — Other Ambulatory Visit (INDEPENDENT_AMBULATORY_CARE_PROVIDER_SITE_OTHER): Payer: Self-pay | Admitting: *Deleted

## 2013-09-19 NOTE — Telephone Encounter (Signed)
A refill request was sent to Dr.Rehman. 

## 2013-09-19 NOTE — Telephone Encounter (Signed)
Needs ENTOCORT EC Refilled. Please call Ann when completed. The return phone number is [phone].

## 2013-09-19 NOTE — Telephone Encounter (Signed)
Patient's daughter called about the refill for Entocort. EC. Last filled 07/12/13.

## 2013-09-20 ENCOUNTER — Other Ambulatory Visit (INDEPENDENT_AMBULATORY_CARE_PROVIDER_SITE_OTHER): Payer: Self-pay | Admitting: *Deleted

## 2013-09-20 NOTE — Telephone Encounter (Signed)
This prescription :  Budesonide (Entocort EC)  3 mg Patient is to take 1 by mouth twice daily #60 3 refills was called to [facility]. I called and left a telephone message on her daughter,Ann's , cell number.

## 2013-09-21 MED ORDER — BUDESONIDE 3 MG PO CP24: 3.0000 mg | ORAL_CAPSULE | Freq: Two times a day (BID) | ORAL | Status: DC

## 2013-09-28 ENCOUNTER — Encounter (INDEPENDENT_AMBULATORY_CARE_PROVIDER_SITE_OTHER): Payer: Self-pay | Admitting: Internal Medicine

## 2013-09-28 ENCOUNTER — Ambulatory Visit (INDEPENDENT_AMBULATORY_CARE_PROVIDER_SITE_OTHER): Payer: Medicare Other | Admitting: Internal Medicine

## 2013-09-28 VITALS — BP 102/54 | HR 80 | Temp 97.7°F | Ht 61.0 in | Wt 99.2 lb

## 2013-09-28 DIAGNOSIS — K5289 Other specified noninfective gastroenteritis and colitis: Secondary | ICD-10-CM

## 2013-09-28 DIAGNOSIS — K52839 Microscopic colitis, unspecified: Secondary | ICD-10-CM

## 2013-09-28 NOTE — Progress Notes (Signed)
Subjective:    Patient ID: Bridget Ball, female    DOB: August 22, 1928, 80 y.o.   MRN: [id number] HPI   Daughter states patient's left kidney was enlarged and infected. She was septic. Pylenephritis . She was transferred to [facility] and a left percutaneous nephrostomy tube placed. She had a very complicated stay while at [facility].  She had a left ureteral stent placed this month and nephrostomy tube removed. She actually had been to our office on 07/25/2013 and was very lethargic. She appeared lethargic and c/o back pain. I directed her to the ED.  She ultimately was discharged from [facility] to [facility] for IV antibiotics. She tells me she feels good. She occasionally has diarrhea. Daughter states this am she had a normal stool. Hx of Microscopic colitis. Appetite is fair. She has lost about 5 pounds since her last visit. Usually has a BM daily.   CBC    Component Value Date/Time   WBC 6.3 09/14/2013 1525   RBC 3.13* 09/14/2013 1525   HGB 9.8* 09/14/2013 1525   HCT 30.2* 09/14/2013 1525   PLT 248 09/14/2013 1525   MCV 96.5 09/14/2013 1525   MCH 31.3 09/14/2013 1525   MCHC 32.5 09/14/2013 1525   RDW 15.8* 09/14/2013 1525   LYMPHSABS 0.2* 07/25/2013 1225   MONOABS 0.4 07/25/2013 1225   EOSABS 0.0 07/25/2013 1225   BASOSABS 0.0 07/25/2013 1225    CMP     Component Value Date/Time   NA 140 09/14/2013 1525   K 4.1 09/14/2013 1525   CL 103 09/14/2013 1525   CO2 27 09/14/2013 1525   GLUCOSE 86 09/14/2013 1525   BUN 10 09/14/2013 1525   CREATININE 0.85 09/14/2013 1525   CALCIUM 9.4 09/14/2013 1525   PROT 4.9* 07/27/2013 0535   ALBUMIN 1.8* 07/27/2013 0535   AST 62* 07/27/2013 0535   ALT 49* 07/27/2013 0535   ALKPHOS 174* 07/27/2013 0535   BILITOT 0.7 07/27/2013 0535   GFRNONAA 62* 09/14/2013 1525   GFRAA 72* 09/14/2013 1525         07/25/2013 CT abdomen/pelvis with CM;   MPRESSION:  Stable left-sided UPJ obstruction with severe hydronephrosis.  Chololithiasis.   Admit date:  07/25/2013  Discharge date: 08/03/2013  Recommendations for Outpatient Follow-up:  1. Discharge to skilled nursing facility for ongoing physical and occupational therapy 2. Continue vancomycin through August 22, then stop 3. Please check weekly vancomycin trough and CMP to be reviewed by supervising physician 4. CBC in 1 week to check WBC count 5. Continue ceftriaxone through August 15, then stop 6. Continue fluconazole through August 22, then stop 7. Followup with Doctor Dahlstedt, Urology, within 2 weeks for outpatient nephrostogram, possible cystoscopy, left retrograde and ureteroscopy 8. Daily weights. If she loses more than 3-lbs in 24 hours or 5-lbs in 1 week, please stop her lasix. If she gains more than 3-lbs in 24 hours or 5-lbs in 1 week, please notify supervising physician.  9. Cardiology follow up within 1 month 10. Routine follow up with Dr. Leonie Man and Dr. Laural Golden per family.      05/2013 EGD/Colonoscopy: Dr Laural Golden: Impression:  EGD findings;  Erosive reflux esophagitis with small sliding hiatal hernia.  No evidence of gastritis or peptic ulcer disease.  Random biopsies taken from post bulbar mucosa for routine histology.  Colonoscopy findings;  Incomplete exam limited to flexible sigmoidoscopy.  Patient has tortuous noncompliance sigmoid colon resulting in loop formation.  Mucosa of sigmoid colon and rectum was  normal.  Mucosal biopsy taken from sigmoid colon to rule out microscopic or collagenous colitis.   Biopsy: Forward to PCP [name]). Notes Recorded by Rogene Houston, MD on 05/29/2013 at 9:28 PM Please call patient with results and also prescription for budesonide She has microscopic colitis. Will treat with budesonide 9 mg daily for 2 weeks 6 mg daily for 2 weeks and 3 mg daily for 2 weeks. Office visit in 4 weeks. Report to PCP Notes Recorded by Rogene Houston, MD on 05/29/2013 at 9:28 PM     Review of Systems Past Medical History  Diagnosis Date  . COPD  (chronic obstructive pulmonary disease)   . Arthritis   . Depression   . Memory difficulties   . Chronic diarrhea   . Hiatal hernia   . Cancer     Skin cancer  . Hyperlipidemia   . Myocardial infarction     unknown time   . Shortness of breath     with exertion  . Seizures     x 2 last year due to bleeding to the brain.  . Stroke     TIA four years ago / stroke NOV 2014  . Bruises easily   . GERD (gastroesophageal reflux disease)   . Microscopic colitis   . Colitis   . Hydronephrosis, left   . Hx of septic shock JULY 2015  . Dementia   . Anxiety   . Hx of pulmonary edema JULY 2015  . Attention to nephrostomy     PT HAS NEPHROSTOMY TUBE IN PLACE  . Hx of bronchitis   . Hx of pulmonary edema JULY 2015    Past Surgical History  Procedure Laterality Date  . Appendectomy    . Skin cancer excision    . Tubal ligation    . Colonoscopy N/A 05/24/2013    Procedure: COLONOSCOPY;  Surgeon: Rogene Houston, MD;  [facility];  Service: Endoscopy;  Laterality: N/A;  100  . Esophagogastroduodenoscopy N/A 05/24/2013    Procedure: ESOPHAGOGASTRODUODENOSCOPY (EGD);  Surgeon: Rogene Houston, MD;  Location: [facility];  Service: Endoscopy;  Laterality: N/A;  . Tonsillectomy    . Cystoscopy with retrograde pyelogram, ureteroscopy and stent placement Left 09/18/2013    Procedure: CYSTOSCOPY WITH RETROGRADE PYELOGRAM,  URETEROSCOPY AND  STENT PLACEMENT, removal of nephrostomy tube;  Surgeon: Jorja Loa, MD;  Location: [facility];  Service: Urology;  Laterality: Left;    Allergies  Allergen Reactions  . Penicillins Anaphylaxis    Throat swelling as a child. Tolerated ceftriaxone 07/29/13.    Current Outpatient Prescriptions on File Prior to Visit  Medication Sig Dispense Refill  . albuterol (PROVENTIL HFA;VENTOLIN HFA) 108 (90 BASE) MCG/ACT inhaler Inhale 2 puffs into the lungs every 6 (six) hours as needed for wheezing or shortness of breath.  1 Inhaler  0  .  atorvastatin (LIPITOR) 80 MG tablet Take 1 tablet (80 mg total) by mouth daily at 6 PM.  30 tablet  0  . budesonide (ENTOCORT EC) 3 MG 24 hr capsule Take 1 capsule (3 mg total) by mouth 2 (two) times daily.  60 capsule  3  . docusate sodium (COLACE) 100 MG capsule Take 100 mg by mouth daily as needed for mild constipation.      . feeding supplement, ENSURE COMPLETE, (ENSURE COMPLETE) LIQD Take 237 mLs by mouth 3 (three) times daily between meals.  90 Bottle  0  . ibuprofen (ADVIL,MOTRIN) 200 MG tablet Take 200-400 mg  by mouth once as needed for headache.      . levETIRAcetam (KEPPRA) 250 MG tablet Take 1 tablet (250 mg total) by mouth 2 (two) times daily.  60 tablet  0  . Methylfol-Methylcob-Acetylcyst (CEREFOLIN NAC) 6-2-600 MG TABS Take 1 tablet by mouth daily.  30 each  11  . pantoprazole (PROTONIX) 40 MG tablet Take 1 tablet (40 mg total) by mouth daily before breakfast.  30 tablet  5  . tiotropium (SPIRIVA) 18 MCG inhalation capsule Place 18 mcg into inhaler and inhale at bedtime.      . citalopram (CELEXA) 10 MG tablet Take 10 mg by mouth every morning.        No current facility-administered medications on file prior to visit.        Objective:   Physical Exam  Filed Vitals:   09/28/13 1131  BP: 102/54  Pulse: 80  Temp: 97.7 F (36.5 C)  Height: 5\' 1"  (1.549 m)  Weight: 99 lb 3.2 oz (44.997 kg)   Alert and oriented. Skin warm and dry. Oral mucosa is moist.   . Sclera anicteric, conjunctivae is pink. Thyroid not enlarged. No cervical lymphadenopathy. Lungs clear. Heart regular rate and rhythm.  Abdomen is soft. Bowel sounds are positive. No hepatomegaly. No abdominal masses felt. No tenderness.  No edema to lower extremities.         Assessment & Plan:  Microscopic colitis. She does occasionally have some diarrhea which patient states is not watery. Has been on entocort BID. I advised daughter to let mother try Imodium daily and see how she does. Stop the Endocort for now.   .  #3. Pancreatico biliary tract disease. Patient has sludge in the bile duct which is mildly dilated and single stone and pancreatic duct. She had an appt at [facility] for an ERCP but this has been cancelled. Patient at present is still recuperating from recent hospital admission. Will see back in 3 months with Dr. Laural Golden for further evaluation of her dilated CBD

## 2013-09-28 NOTE — Patient Instructions (Signed)
OV in 3 months with Dr. Rehman 

## 2013-10-05 ENCOUNTER — Encounter (INDEPENDENT_AMBULATORY_CARE_PROVIDER_SITE_OTHER): Payer: Self-pay | Admitting: *Deleted

## 2013-10-09 ENCOUNTER — Other Ambulatory Visit (HOSPITAL_COMMUNITY): Payer: Self-pay | Admitting: Internal Medicine

## 2013-10-09 DIAGNOSIS — N63 Unspecified lump in unspecified breast: Secondary | ICD-10-CM

## 2013-10-17 ENCOUNTER — Ambulatory Visit (HOSPITAL_COMMUNITY)
Admission: RE | Admit: 2013-10-17 | Discharge: 2013-10-17 | Disposition: A | Discharge status: Home / Self Care | Payer: Medicare Other | Source: Ambulatory Visit | Attending: Internal Medicine | Admitting: Internal Medicine

## 2013-10-17 ENCOUNTER — Other Ambulatory Visit (HOSPITAL_COMMUNITY): Payer: Self-pay | Admitting: Internal Medicine

## 2013-10-17 DIAGNOSIS — N63 Unspecified lump in unspecified breast: Secondary | ICD-10-CM

## 2013-10-17 DIAGNOSIS — R229 Localized swelling, mass and lump, unspecified: Principal | ICD-10-CM

## 2013-10-17 DIAGNOSIS — IMO0002 Reserved for concepts with insufficient information to code with codable children: Secondary | ICD-10-CM

## 2013-10-24 ENCOUNTER — Ambulatory Visit: Payer: Medicare Other | Admitting: Urology

## 2013-10-24 ENCOUNTER — Other Ambulatory Visit: Payer: Self-pay | Admitting: Urology

## 2013-10-24 DIAGNOSIS — Z87440 Personal history of urinary (tract) infections: Secondary | ICD-10-CM

## 2013-10-24 DIAGNOSIS — N1330 Unspecified hydronephrosis: Secondary | ICD-10-CM

## 2013-10-27 ENCOUNTER — Encounter (INDEPENDENT_AMBULATORY_CARE_PROVIDER_SITE_OTHER): Payer: Self-pay | Admitting: *Deleted

## 2013-10-31 ENCOUNTER — Other Ambulatory Visit (HOSPITAL_COMMUNITY): Payer: Self-pay | Admitting: Internal Medicine

## 2013-10-31 ENCOUNTER — Encounter (HOSPITAL_COMMUNITY): Payer: Self-pay

## 2013-10-31 ENCOUNTER — Ambulatory Visit (HOSPITAL_COMMUNITY)
Admission: RE | Admit: 2013-10-31 | Discharge: 2013-10-31 | Disposition: A | Discharge status: Home / Self Care | Payer: Medicare Other | Source: Ambulatory Visit | Attending: Internal Medicine | Admitting: Internal Medicine

## 2013-10-31 DIAGNOSIS — N63 Unspecified lump in breast: Secondary | ICD-10-CM | POA: Insufficient documentation

## 2013-10-31 DIAGNOSIS — IMO0002 Reserved for concepts with insufficient information to code with codable children: Secondary | ICD-10-CM

## 2013-10-31 DIAGNOSIS — R229 Localized swelling, mass and lump, unspecified: Secondary | ICD-10-CM

## 2013-10-31 MED ORDER — LIDOCAINE HCL (PF) 2 % IJ SOLN
INTRAMUSCULAR | Status: AC
  Administered 2013-10-31: 10 mL
  Filled 2013-10-31: qty 10

## 2013-10-31 NOTE — Discharge Instructions (Addendum)
Breast Biopsy °A breast biopsy is a test during which a sample of tissue is taken from your breast. The breast tissue is looked at under a microscope for cancer cells.  °BEFORE THE PROCEDURE °· Make plans to have someone drive you home after the test. °· Do not smoke for 2 weeks before the test. Stop smoking, if you smoke. °· Do not drink alcohol for 24 hours before the test. °· Wear a good support bra to the test. °PROCEDURE  °You may be given one of the following: °· A medicine to numb the breast area (local anesthetic). °· A medicine to make you fall asleep (general anesthetic). °There are different types of breast biopsies. They include: °· Fine-needle aspiration. °¨ A needle is put into the breast lump. °¨ The needle takes out fluid and cells from the lump. °¨ Ultrasound imaging may be used to help find the lump and to put the needle in the right spot. °· Core-needle biopsy. °¨ A needle is put into the breast lump. °¨ The needle is put in your breast 3-6 times. °¨ The needle removes breast tissue. °¨ An ultrasound image or X-ray is often used to find the right spot to put in the needle. °· Stereotactic biopsy. °¨ X-rays and a computer are used to study X-ray pictures of the breast lump. °¨ The computer finds where the needle needs to be put into the breast. °¨ Tissue samples are taken out. °· Vacuum-assisted biopsy. °¨ A small cut (incision) is made in your breast. °¨ A biopsy device is put through the cut and into the breast tissue. °¨ The biopsy device draws abnormal breast tissue into the biopsy device. °¨ A large tissue sample is often removed. °¨ No stitches are needed. °· Ultrasound-guided core-needle biopsy. °¨ Ultrasound imaging helps guide the needle into the area of the breast that is not normal. °¨ A cut is made in the breast. The needle is put into the breast lump. °¨ Tissue samples are taken out. °· Open biopsy. °¨ A large cut is made in the breast. °¨ Your doctor will try to remove the whole  breast lump or as much as possible. °All tissue, fluid, or cell samples are looked at under a microscope.  °AFTER THE PROCEDURE °· You will be taken to an area to recover. You will be able to go home once you are doing well and are without problems. °· You may have bruising on your breast. This is normal. °· A pressure bandage (dressing) may be put on your breast for 24-48 hours. This type of bandage is wrapped tightly around your chest. It helps stop fluid from building up underneath tissues. °Document Released: 03/16/2011 Document Revised: 05/08/2013 Document Reviewed: 03/16/2011 °ExitCare® Patient Information ©2015 [employer]. This information is not intended to replace advice given to you by your health care provider. Make sure you discuss any questions you have with your health care provider. °Breast Biopsy °Care After °These instructions give you information on caring for yourself after your procedure. Your doctor may also give you more specific instructions. Call your doctor if you have any problems or questions after your procedure. °HOME CARE °· Only take medicine as told by your doctor. °· Do not take aspirin. °· Keep your sutures (stitches) dry when bathing. °· Protect the biopsy area. Do not let the area get bumped. °· Avoid activities that could pull the biopsy site open until your doctor approves. This includes: °· Stretching. °· Reaching. °· Exercise. °·   Sports. °· Lifting more than 3lb. °· Continue your normal diet. °· Wear a good support bra for as long as told by your doctor. °· Change any bandages (dressings) as told by your doctor. °· Do not drink alcohol while taking pain medicine. °· Keep all doctor visits as told. Ask when your test results will be ready. Make sure you get your test results. °GET HELP RIGHT AWAY IF:  °· You have a fever. °· You have more bleeding (more than a small spot) from the biopsy site. °· You have trouble breathing. °· You have yellowish-white fluid (pus) coming from  the biopsy site. °· You have redness, puffiness (swelling), or more pain in the biopsy site. °· You have a bad smell coming from the biopsy site. °· Your biopsy site opens after sutures, staples, or sticky strips have been removed. °· You have a rash. °· You need stronger medicine. °MAKE SURE YOU: °· Understand these instructions. °· Will watch your condition. °· Will get help right away if you are not doing well or get worse. °Document Released: 10/18/2008 Document Revised: 03/16/2011 Document Reviewed: 02/01/2011 °ExitCare® Patient Information ©2015 [employer]. This information is not intended to replace advice given to you by your health care provider. Make sure you discuss any questions you have with your health care provider. ° °

## 2013-11-07 ENCOUNTER — Telehealth: Payer: Self-pay | Admitting: Nurse Practitioner

## 2013-11-07 ENCOUNTER — Ambulatory Visit: Payer: Medicare Other | Admitting: Nurse Practitioner

## 2013-11-07 NOTE — Telephone Encounter (Signed)
Patient was no show for today's office appointment.  

## 2013-11-08 ENCOUNTER — Encounter: Payer: Self-pay | Admitting: *Deleted

## 2013-12-07 ENCOUNTER — Encounter (INDEPENDENT_AMBULATORY_CARE_PROVIDER_SITE_OTHER): Payer: Self-pay | Admitting: *Deleted

## 2013-12-18 ENCOUNTER — Ambulatory Visit (INDEPENDENT_AMBULATORY_CARE_PROVIDER_SITE_OTHER): Payer: Medicare Other | Admitting: Internal Medicine

## 2013-12-22 ENCOUNTER — Encounter (INDEPENDENT_AMBULATORY_CARE_PROVIDER_SITE_OTHER): Payer: Self-pay

## 2014-01-01 ENCOUNTER — Ambulatory Visit (INDEPENDENT_AMBULATORY_CARE_PROVIDER_SITE_OTHER): Payer: Medicare Other | Admitting: Internal Medicine

## 2014-01-09 ENCOUNTER — Ambulatory Visit (INDEPENDENT_AMBULATORY_CARE_PROVIDER_SITE_OTHER): Payer: Medicare Other | Admitting: Internal Medicine

## 2014-01-15 ENCOUNTER — Other Ambulatory Visit (INDEPENDENT_AMBULATORY_CARE_PROVIDER_SITE_OTHER): Payer: Self-pay | Admitting: Internal Medicine

## 2014-01-16 ENCOUNTER — Ambulatory Visit (HOSPITAL_COMMUNITY)
Admission: RE | Admit: 2014-01-16 | Discharge: 2014-01-16 | Disposition: A | Discharge status: Home / Self Care | Payer: Medicare Other | Source: Ambulatory Visit | Attending: Urology | Admitting: Urology

## 2014-01-16 DIAGNOSIS — N1330 Unspecified hydronephrosis: Secondary | ICD-10-CM | POA: Diagnosis not present

## 2014-01-23 ENCOUNTER — Ambulatory Visit (INDEPENDENT_AMBULATORY_CARE_PROVIDER_SITE_OTHER): Payer: Medicare Other | Admitting: Urology

## 2014-01-23 ENCOUNTER — Other Ambulatory Visit: Payer: Self-pay | Admitting: Urology

## 2014-01-23 DIAGNOSIS — N1330 Unspecified hydronephrosis: Secondary | ICD-10-CM

## 2014-01-25 NOTE — H&P (Signed)
Bridget Ball is an 81 y.o. female.   Chief Complaint: **Right breast wound* HPI: *Patient is an 81yo wf with a nonhealing right breast wound.  Previous mammogram negative.  Has failed conservative therapy as outpatient.**  Past Medical History  Diagnosis Date  . COPD (chronic obstructive pulmonary disease)   . Arthritis   . Depression   . Memory difficulties   . Chronic diarrhea   . Hiatal hernia   . Cancer     Skin cancer  . Hyperlipidemia   . Myocardial infarction     unknown time   . Shortness of breath     with exertion  . Seizures     x 2 last year due to bleeding to the brain.  . Stroke     TIA four years ago / stroke NOV 2014  . Bruises easily   . GERD (gastroesophageal reflux disease)   . Microscopic colitis   . Colitis   . Hydronephrosis, left   . Hx of septic shock JULY 2015  . Dementia   . Anxiety   . Hx of pulmonary edema JULY 2015  . Attention to nephrostomy     PT HAS NEPHROSTOMY TUBE IN PLACE  . Hx of bronchitis   . Hx of pulmonary edema JULY 2015    Past Surgical History  Procedure Laterality Date  . Appendectomy    . Skin cancer excision    . Tubal ligation    . Colonoscopy N/A 05/24/2013    Procedure: COLONOSCOPY;  Surgeon: Rogene Houston, MD;  [facility];  Service: Endoscopy;  Laterality: N/A;  100  . Esophagogastroduodenoscopy N/A 05/24/2013    Procedure: ESOPHAGOGASTRODUODENOSCOPY (EGD);  Surgeon: Rogene Houston, MD;  [facility];  Service: Endoscopy;  Laterality: N/A;  . Tonsillectomy    . Cystoscopy with retrograde pyelogram, ureteroscopy and stent placement Left 09/18/2013    Procedure: CYSTOSCOPY WITH RETROGRADE PYELOGRAM,  URETEROSCOPY AND  STENT PLACEMENT, removal of nephrostomy tube;  Surgeon: Jorja Loa, MD;  Location: [facility];  Service: Urology;  Laterality: Left;    Family History  Problem Relation Age of Onset  . Diabetes Mother    Social History:  reports that she quit smoking about 16 years  ago. She does not have any smokeless tobacco history on file. She reports that she drinks about 4.2 oz of alcohol per week. She reports that she does not use illicit drugs.  Allergies:  Allergies  Allergen Reactions  . Penicillins Anaphylaxis    Throat swelling as a child. Tolerated ceftriaxone 07/29/13.    No prescriptions prior to admission    No results found for this or any previous visit (from the past 48 hour(s)). No results found.  Review of Systems  Constitutional: Negative.   HENT: Negative.   Respiratory: Negative.   Cardiovascular: Negative.   Gastrointestinal: Negative.   All other systems reviewed and are negative.   There were no vitals taken for this visit. Physical Exam  Constitutional: She is oriented to person, place, and time. She appears well-developed and well-nourished.  HENT:  Head: Normocephalic and atraumatic.  Neck: Normal range of motion. Neck supple.  Cardiovascular: Normal rate, regular rhythm and normal heart sounds.   Respiratory: Effort normal and breath sounds normal.  GI: Soft.  Neurological: She is alert and oriented to person, place, and time.  Skin:  Right breast with open granulating wound present along upper, inner portion of the breast.  No nipple discharge.  Axilla negative.     Assessment/Plan **Imp:  [place]/neoplasm right breast* Plan:  Scheduled for right breast biopsy to rule out underlying neoplastic process on 03/05/14.  Risks and benefits of procedure were fully explained to the patient, who gives informed consent. Christerpher Clos A 01/25/2014, 11:16 AM

## 2014-01-30 ENCOUNTER — Ambulatory Visit (INDEPENDENT_AMBULATORY_CARE_PROVIDER_SITE_OTHER): Payer: Medicare Other | Admitting: Internal Medicine

## 2014-01-30 NOTE — Patient Instructions (Signed)
Bridget Ball  01/30/2014   Your procedure is scheduled on:   02/02/2014  Report to [facility] at  700  AM.  Call this number if you have problems the morning of surgery: [phone]   Remember:   Do not eat food or drink liquids after midnight.   Take these medicines the morning of surgery with A SIP OF WATER:  Celexa, keppra, protonix, vesicare   Do not wear jewelry, make-up or nail polish.  Do not wear lotions, powders, or perfumes.   Do not shave 48 hours prior to surgery. Men may shave face and neck.  Do not bring valuables to the hospital.  [facility] is not responsible for any belongings or valuables.               Contacts, dentures or bridgework may not be worn into surgery.  Leave suitcase in the car. After surgery it may be brought to your room.  For patients admitted to the hospital, discharge time is determined by your treatment team.               Patients discharged the day of surgery will not be allowed to drive home.  Name and phone number of your driver: family  Special Instructions: Shower using CHG 2 nights before surgery and the night before surgery.  If you shower the day of surgery use CHG.  Use special wash - you have one bottle of CHG for all showers.  You should use approximately 1/3 of the bottle for each shower.   Please read over the following fact sheets that you were given: Pain Booklet, Coughing and Deep Breathing, Surgical Site Infection Prevention, Anesthesia Post-op Instructions and Care and Recovery After Surgery Breast Biopsy A breast biopsy is a test during which a sample of tissue is taken from your breast. The breast tissue is looked at under a microscope for cancer cells.  BEFORE THE PROCEDURE  Make plans to have someone drive you home after the test.  Do not smoke for 2 weeks before the test. Stop smoking, if you smoke.  Do not drink alcohol for 24 hours before the test.  Wear a good support bra to the test. PROCEDURE  You may  be given one of the following:  A medicine to numb the breast area (local anesthetic).  A medicine to make you fall asleep (general anesthetic). There are different types of breast biopsies. They include:  Fine-needle aspiration.  A needle is put into the breast lump.  The needle takes out fluid and cells from the lump.  Ultrasound imaging may be used to help find the lump and to put the needle in the right spot.  Core-needle biopsy.  A needle is put into the breast lump.  The needle is put in your breast 3-6 times.  The needle removes breast tissue.  An ultrasound image or X-ray is often used to find the right spot to put in the needle.  Stereotactic biopsy.  X-rays and a computer are used to study X-ray pictures of the breast lump.  The computer finds where the needle needs to be put into the breast.  Tissue samples are taken out.  Vacuum-assisted biopsy.  A small cut (incision) is made in your breast.  A biopsy device is put through the cut and into the breast tissue.  The biopsy device draws abnormal breast tissue into the biopsy device.  A large tissue sample is often removed.  No  stitches are needed.  Ultrasound-guided core-needle biopsy.  Ultrasound imaging helps guide the needle into the area of the breast that is not normal.  A cut is made in the breast. The needle is put into the breast lump.  Tissue samples are taken out.  Open biopsy.  A large cut is made in the breast.  Your doctor will try to remove the whole breast lump or as much as possible. All tissue, fluid, or cell samples are looked at under a microscope.  AFTER THE PROCEDURE  You will be taken to an area to recover. You will be able to go home once you are doing well and are without problems.  You may have bruising on your breast. This is normal.  A pressure bandage (dressing) may be put on your breast for 24-48 hours. This type of bandage is wrapped tightly around your chest. It  helps stop fluid from building up underneath tissues. Document Released: 03/16/2011 Document Revised: 05/08/2013 Document Reviewed: 03/16/2011 [facility] Patient Information 2015 [place], [place]. This information is not intended to replace advice given to you by your health care provider. Make sure you discuss any questions you have with your health care provider. PATIENT INSTRUCTIONS POST-ANESTHESIA  IMMEDIATELY FOLLOWING SURGERY:  Do not drive or operate machinery for the first twenty four hours after surgery.  Do not make any important decisions for twenty four hours after surgery or while taking narcotic pain medications or sedatives.  If you develop intractable nausea and vomiting or a severe headache please notify your doctor immediately.  FOLLOW-UP:  Please make an appointment with your surgeon as instructed. You do not need to follow up with anesthesia unless specifically instructed to do so.  WOUND CARE INSTRUCTIONS (if applicable):  Keep a dry clean dressing on the anesthesia/puncture wound site if there is drainage.  Once the wound has quit draining you may leave it open to air.  Generally you should leave the bandage intact for twenty four hours unless there is drainage.  If the epidural site drains for more than 36-48 hours please call the anesthesia department.  QUESTIONS?:  Please feel free to call your physician or the hospital operator if you have any questions, and they will be happy to assist you.

## 2014-01-31 ENCOUNTER — Encounter (HOSPITAL_COMMUNITY)
Admission: RE | Admit: 2014-01-31 | Discharge: 2014-01-31 | Disposition: A | Discharge status: Home / Self Care | Payer: Medicare Other | Source: Ambulatory Visit | Attending: General Surgery | Admitting: General Surgery

## 2014-01-31 NOTE — Progress Notes (Signed)
Pt did not come for PAT.Marland Kitchen Pt instructed over phone .

## 2014-02-02 ENCOUNTER — Ambulatory Visit (HOSPITAL_COMMUNITY)
Admission: RE | Admit: 2014-02-02 | Discharge: 2014-02-02 | Disposition: A | Discharge status: Home / Self Care | Payer: Medicare Other | Source: Ambulatory Visit | Attending: General Surgery | Admitting: General Surgery

## 2014-02-02 ENCOUNTER — Ambulatory Visit (HOSPITAL_COMMUNITY): Payer: Medicare Other | Admitting: Anesthesiology

## 2014-02-02 ENCOUNTER — Encounter (HOSPITAL_COMMUNITY): Payer: Self-pay | Admitting: *Deleted

## 2014-02-02 ENCOUNTER — Encounter (HOSPITAL_COMMUNITY)
Admission: RE | Disposition: A | Discharge status: Home / Self Care | Payer: Self-pay | Source: Ambulatory Visit | Attending: General Surgery

## 2014-02-02 DIAGNOSIS — L980 Pyogenic granuloma: Secondary | ICD-10-CM | POA: Insufficient documentation

## 2014-02-02 DIAGNOSIS — E785 Hyperlipidemia, unspecified: Secondary | ICD-10-CM | POA: Diagnosis not present

## 2014-02-02 DIAGNOSIS — F418 Other specified anxiety disorders: Secondary | ICD-10-CM | POA: Diagnosis not present

## 2014-02-02 DIAGNOSIS — I10 Essential (primary) hypertension: Secondary | ICD-10-CM | POA: Diagnosis not present

## 2014-02-02 DIAGNOSIS — N289 Disorder of kidney and ureter, unspecified: Secondary | ICD-10-CM | POA: Diagnosis not present

## 2014-02-02 DIAGNOSIS — D493 Neoplasm of unspecified behavior of breast: Secondary | ICD-10-CM | POA: Diagnosis present

## 2014-02-02 DIAGNOSIS — Z8673 Personal history of transient ischemic attack (TIA), and cerebral infarction without residual deficits: Secondary | ICD-10-CM | POA: Diagnosis not present

## 2014-02-02 DIAGNOSIS — Z79899 Other long term (current) drug therapy: Secondary | ICD-10-CM | POA: Insufficient documentation

## 2014-02-02 DIAGNOSIS — K219 Gastro-esophageal reflux disease without esophagitis: Secondary | ICD-10-CM | POA: Diagnosis not present

## 2014-02-02 DIAGNOSIS — Z87891 Personal history of nicotine dependence: Secondary | ICD-10-CM | POA: Diagnosis not present

## 2014-02-02 DIAGNOSIS — J449 Chronic obstructive pulmonary disease, unspecified: Secondary | ICD-10-CM | POA: Diagnosis not present

## 2014-02-02 HISTORY — PX: BREAST BIOPSY: SHX20

## 2014-02-02 LAB — CBC WITH DIFFERENTIAL/PLATELET
BASOS PCT: 0 % (ref 0–1)
Basophils Absolute: 0 10*3/uL (ref 0.0–0.1)
EOS ABS: 0.3 10*3/uL (ref 0.0–0.7)
EOS PCT: 5 % (ref 0–5)
HCT: 36.7 % (ref 36.0–46.0)
Hemoglobin: 11.7 g/dL — ABNORMAL LOW (ref 12.0–15.0)
LYMPHS ABS: 1 10*3/uL (ref 0.7–4.0)
Lymphocytes Relative: 17 % (ref 12–46)
MCH: 31.2 pg (ref 26.0–34.0)
MCHC: 31.9 g/dL (ref 30.0–36.0)
MCV: 97.9 fL (ref 78.0–100.0)
Monocytes Absolute: 0.6 10*3/uL (ref 0.1–1.0)
Monocytes Relative: 10 % (ref 3–12)
Neutro Abs: 3.9 10*3/uL (ref 1.7–7.7)
Neutrophils Relative %: 68 % (ref 43–77)
Platelets: 246 10*3/uL (ref 150–400)
RBC: 3.75 MIL/uL — ABNORMAL LOW (ref 3.87–5.11)
RDW: 16 % — AB (ref 11.5–15.5)
WBC: 5.7 10*3/uL (ref 4.0–10.5)

## 2014-02-02 LAB — BASIC METABOLIC PANEL
Anion gap: 4 — ABNORMAL LOW (ref 5–15)
BUN: 19 mg/dL (ref 6–23)
CALCIUM: 9.2 mg/dL (ref 8.4–10.5)
CO2: 30 mmol/L (ref 19–32)
CREATININE: 0.99 mg/dL (ref 0.50–1.10)
Chloride: 105 mmol/L (ref 96–112)
GFR calc Af Amer: 59 mL/min — ABNORMAL LOW (ref >90)
GFR, EST NON AFRICAN AMERICAN: 51 mL/min — AB (ref >90)
Glucose, Bld: 94 mg/dL (ref 70–99)
POTASSIUM: 3.9 mmol/L (ref 3.5–5.1)
SODIUM: 139 mmol/L (ref 135–145)

## 2014-02-02 SURGERY — BREAST BIOPSY
Anesthesia: Monitor Anesthesia Care | Site: Breast | Laterality: Right

## 2014-02-02 MED ORDER — 0.9 % SODIUM CHLORIDE (POUR BTL) OPTIME
TOPICAL | Status: DC | PRN
  Administered 2014-02-02: 1000 mL

## 2014-02-02 MED ORDER — CHLORHEXIDINE GLUCONATE 4 % EX LIQD: 1.0000 "application " | Freq: Once | CUTANEOUS | Status: DC

## 2014-02-02 MED ORDER — LIDOCAINE HCL (PF) 1 % IJ SOLN
INTRAMUSCULAR | Status: AC
  Filled 2014-02-02: qty 30

## 2014-02-02 MED ORDER — VANCOMYCIN HCL IN DEXTROSE 1-5 GM/200ML-% IV SOLN
1000.0000 mg | INTRAVENOUS | Status: AC
  Administered 2014-02-02 (×2): 1000 mg via INTRAVENOUS
  Filled 2014-02-02: qty 200

## 2014-02-02 MED ORDER — MIDAZOLAM HCL 2 MG/2ML IJ SOLN
INTRAMUSCULAR | Status: AC
  Filled 2014-02-02: qty 2

## 2014-02-02 MED ORDER — MIDAZOLAM HCL 2 MG/2ML IJ SOLN
1.0000 mg | INTRAMUSCULAR | Status: DC | PRN
  Administered 2014-02-02: 2 mg via INTRAVENOUS

## 2014-02-02 MED ORDER — HYDROCODONE-ACETAMINOPHEN 5-325 MG PO TABS: 1.0000 | ORAL_TABLET | Freq: Four times a day (QID) | ORAL | Status: DC | PRN

## 2014-02-02 MED ORDER — FENTANYL CITRATE 0.05 MG/ML IJ SOLN
25.0000 ug | INTRAMUSCULAR | Status: AC
  Administered 2014-02-02 (×2): 25 ug via INTRAVENOUS

## 2014-02-02 MED ORDER — FENTANYL CITRATE 0.05 MG/ML IJ SOLN
INTRAMUSCULAR | Status: AC
  Filled 2014-02-02: qty 2

## 2014-02-02 MED ORDER — PROPOFOL INFUSION 10 MG/ML OPTIME
INTRAVENOUS | Status: DC | PRN
  Administered 2014-02-02: 35 ug/kg/min via INTRAVENOUS

## 2014-02-02 MED ORDER — MIDAZOLAM HCL 5 MG/5ML IJ SOLN
INTRAMUSCULAR | Status: DC | PRN
  Administered 2014-02-02 (×2): 0.5 mg via INTRAVENOUS

## 2014-02-02 MED ORDER — LACTATED RINGERS IV SOLN
INTRAVENOUS | Status: DC
  Administered 2014-02-02: 08:00:00 via INTRAVENOUS

## 2014-02-02 MED ORDER — PROPOFOL 10 MG/ML IV BOLUS
INTRAVENOUS | Status: AC
  Filled 2014-02-02: qty 20

## 2014-02-02 MED ORDER — FENTANYL CITRATE 0.05 MG/ML IJ SOLN
INTRAMUSCULAR | Status: DC | PRN
  Administered 2014-02-02 (×2): 25 ug via INTRAVENOUS

## 2014-02-02 MED ORDER — LIDOCAINE HCL (PF) 1 % IJ SOLN
INTRAMUSCULAR | Status: DC | PRN
  Administered 2014-02-02: 8 mL

## 2014-02-02 SURGICAL SUPPLY — 37 items
BAG HAMPER (MISCELLANEOUS) ×3 IMPLANT
BLADE 15 SAFETY STRL DISP (BLADE) ×1 IMPLANT
CHLORAPREP W/TINT 26ML (MISCELLANEOUS) ×3 IMPLANT
CLOSURE WOUND 1/4 X3 (GAUZE/BANDAGES/DRESSINGS)
CLOTH BEACON ORANGE TIMEOUT ST (SAFETY) ×3 IMPLANT
COVER LIGHT HANDLE STERIS (MISCELLANEOUS) ×6 IMPLANT
ELECT REM PT RETURN 9FT ADLT (ELECTROSURGICAL) ×3
ELECTRODE REM PT RTRN 9FT ADLT (ELECTROSURGICAL) ×1 IMPLANT
FORMALIN 10 PREFIL 120ML (MISCELLANEOUS) ×3 IMPLANT
GLOVE BIOGEL M 7.0 STRL (GLOVE) ×2 IMPLANT
GLOVE BIOGEL PI IND STRL 7.0 (GLOVE) IMPLANT
GLOVE BIOGEL PI INDICATOR 7.0 (GLOVE) ×6
GLOVE ECLIPSE 6.5 STRL STRAW (GLOVE) ×2 IMPLANT
GLOVE EXAM NITRILE MD LF STRL (GLOVE) ×2 IMPLANT
GLOVE SURG SS PI 7.5 STRL IVOR (GLOVE) ×6 IMPLANT
GOWN STRL REUS W/TWL LRG LVL3 (GOWN DISPOSABLE) ×8 IMPLANT
KIT ROOM TURNOVER APOR (KITS) ×3 IMPLANT
LIQUID BAND (GAUZE/BANDAGES/DRESSINGS) ×2 IMPLANT
MANIFOLD NEPTUNE II (INSTRUMENTS) ×3 IMPLANT
NDL HYPO 18GX1.5 BLUNT FILL (NEEDLE) ×1 IMPLANT
NDL HYPO 25X1 1.5 SAFETY (NEEDLE) ×1 IMPLANT
NEEDLE HYPO 18GX1.5 BLUNT FILL (NEEDLE) IMPLANT
NEEDLE HYPO 25X1 1.5 SAFETY (NEEDLE) ×3 IMPLANT
NS IRRIG 1000ML POUR BTL (IV SOLUTION) ×3 IMPLANT
PACK MINOR (CUSTOM PROCEDURE TRAY) ×3 IMPLANT
PAD ARMBOARD 7.5X6 YLW CONV (MISCELLANEOUS) ×3 IMPLANT
SET BASIN LINEN APH (SET/KITS/TRAYS/PACK) ×3 IMPLANT
SOL PREP PROV IODINE SCRUB 4OZ (MISCELLANEOUS) ×2 IMPLANT
SPONGE GAUZE 2X2 8PLY STER LF (GAUZE/BANDAGES/DRESSINGS)
SPONGE GAUZE 2X2 8PLY STRL LF (GAUZE/BANDAGES/DRESSINGS) IMPLANT
STRIP CLOSURE SKIN 1/4X3 (GAUZE/BANDAGES/DRESSINGS) IMPLANT
SUT SILK 2 0 SH (SUTURE) ×1 IMPLANT
SUT VIC AB 3-0 SH 27 (SUTURE) ×3
SUT VIC AB 3-0 SH 27X BRD (SUTURE) ×1 IMPLANT
SUT VIC AB 4-0 PS2 27 (SUTURE) ×3 IMPLANT
SYR CONTROL 10ML LL (SYRINGE) ×2 IMPLANT
SYRINGE 10CC LL (SYRINGE) ×1 IMPLANT

## 2014-02-02 NOTE — Interval H&P Note (Signed)
History and Physical Interval Note:  02/02/2014 8:32 AM  Bridget Ball  has presented today for surgery, with the diagnosis of right breast neoplasm unspecified  The various methods of treatment have been discussed with the patient and family. After consideration of risks, benefits and other options for treatment, the patient has consented to  Procedure(s): BREAST BIOPSY (Right) as a surgical intervention .  The patient's history has been reviewed, patient examined, no change in status, stable for surgery.  I have reviewed the patient's chart and labs.  Questions were answered to the patient's satisfaction.     Aviva Signs A

## 2014-02-02 NOTE — Anesthesia Preprocedure Evaluation (Signed)
Anesthesia Evaluation  Patient identified by MRN, date of birth, ID band Patient awake    Reviewed: Allergy & Precautions, H&P , NPO status , Patient's Chart, lab work & pertinent test results  History of Anesthesia Complications Negative for: history of anesthetic complications  Airway Mallampati: II  TM Distance: >3 FB Neck ROM: Full    Dental no notable dental hx. (+) Edentulous Upper, Edentulous Lower   Pulmonary shortness of breath, COPD COPD inhaler, former smoker,  breath sounds clear to auscultation  Pulmonary exam normal       Cardiovascular hypertension, + Past MI negative cardio ROS  Rhythm:Regular Rate:Normal     Neuro/Psych Seizures -, Well Controlled,  PSYCHIATRIC DISORDERS Anxiety Depression  Neuromuscular disease CVA    GI/Hepatic Neg liver ROS, hiatal hernia, GERD-  Medicated and Controlled,  Endo/Other  negative endocrine ROS  Renal/GU Renal disease  negative genitourinary   Musculoskeletal  (+) Arthritis -,   Abdominal   Peds negative pediatric ROS (+)  Hematology negative hematology ROS (+)   Anesthesia Other Findings   Reproductive/Obstetrics negative OB ROS                             Anesthesia Physical Anesthesia Plan  ASA: III  Anesthesia Plan: MAC   Post-op Pain Management:    Induction: Intravenous  Airway Management Planned: Simple Face Mask  Additional Equipment:   Intra-op Plan:   Post-operative Plan: Extubation in OR  Informed Consent: I have reviewed the patients History and Physical, chart, labs and discussed the procedure including the risks, benefits and alternatives for the proposed anesthesia with the patient or authorized representative who has indicated his/her understanding and acceptance.   Dental advisory given  Plan Discussed with: CRNA  Anesthesia Plan Comments:         Anesthesia Quick Evaluation

## 2014-02-02 NOTE — Op Note (Signed)
Patient:  Bridget Ball  DOB:  04/11/1928  MRN:  [id number]   Preop Diagnosis:  Right breast neoplasm, unspecified  Postop Diagnosis:  Same  Procedure:  Right breast biopsy  Surgeon:  Aviva Signs, M.D.  Anes:  Mac  Indications:  Patient is an 81 year old white female who has had a granulomatous lesion extending through the skin. She has failed conservative therapy and now presents for right breast biopsy. The risks and benefits of the procedure were fully explained to the patient, who gave informed consent.  Procedure note:  The patient was placed the supine position. After monitored anesthesia care was given, the right breast was prepped and draped using usual sterile technique with ChloraPrep. Surgical site confirmation was performed.  1% Xylocaine was used for local anesthesia. Elliptical incision was made along the granulomatous lesion. This was taken down to the chest wall. The lesion was fully excised without difficulty. Was sent to pathology further examination. A bleeding was controlled using Bovie electrocautery. The subcutaneous layer was reapproximated using 3-0 Vicryl interrupted sutures. The skin was closed using a 4 Vicryl subcuticular suture. Liquid barium was applied.  All tape and needle counts were correct at the end of the procedure. Patient was transferred to PACU in stable condition.  Complications:  None  EBL:  Minimal  Specimen:  Right breast tissue

## 2014-02-02 NOTE — Anesthesia Postprocedure Evaluation (Signed)
  Anesthesia Post-op Note  Patient: Bridget Ball  Procedure(s) Performed: Procedure(s): RIGHT BREAST BIOPSY (Right)  Patient Location: PACU  Anesthesia Type:MAC  Level of Consciousness: awake, alert , oriented and patient cooperative  Airway and Oxygen Therapy: Patient Spontanous Breathing  Post-op Pain: none  Post-op Assessment: Post-op Vital signs reviewed, Patient's Cardiovascular Status Stable, Respiratory Function Stable, Patent Airway, No signs of Nausea or vomiting, Pain level controlled and No headache  Post-op Vital Signs: Reviewed and stable  Last Vitals:  Filed Vitals:   02/02/14 0855  BP: 101/63  Pulse:   Temp:   Resp: 15    Complications: No apparent anesthesia complications

## 2014-02-02 NOTE — Discharge Instructions (Signed)
Breast Biopsy  °Care After °Refer to this sheet in the next few weeks. These instructions provide you with information on caring for yourself after your procedure. Your caregiver may also give you more specific instructions. Your treatment has been planned according to current medical practices, but problems sometimes occur. Call your caregiver if you have any problems or questions after your procedure. °HOME CARE INSTRUCTIONS  °· Only take over-the-counter or prescription medicines for pain, discomfort, or fever as directed by your caregiver. °· Do not take aspirin. It can cause bleeding. °· Keep stitches dry when bathing. °· Protect the biopsy area. Do not let the area get bumped. °· Avoid activities that may pull the incision site open until approved by your caregiver. This can include stretching, reaching, exercise, sports, or lifting over 3 pounds. °· Resume your usual diet. °· Wear a good support bra for as long as directed by your caregiver. °· Change any bandages (dressings) as directed by your caregiver. °· Do not drink alcohol while taking pain medicine. °· Keep all your follow-up appointments with your caregiver. Ask when your test results will be ready. Make sure you get your test results. °SEEK MEDICAL CARE IF:  °· You have redness, swelling, or increasing pain in the biopsy site. °· You have a bad smell coming from the biopsy site or dressing. °· Your biopsy site breaks open after the stitches (sutures), staples, or skin adhesive strips have been removed. °· You have a rash. °· You need stronger medicine. °SEEK IMMEDIATE MEDICAL CARE IF:  °· You have a fever. °· You have increased bleeding (more than a small spot) from the biopsy site. °· You have difficulty breathing. °· You have pus coming from the biopsy site. °MAKE SURE YOU: °· Understand these instructions. °· Will watch your condition. °· Will get help right away if you are not doing well or get worse. °Document Released: 07/11/2004 Document  Revised: 03/16/2011 Document Reviewed: 01/22/2011 °[employer]® Patient Information ©2015 [employer]. This information is not intended to replace advice given to you by your health care provider. Make sure you discuss any questions you have with your health care provider. ° °

## 2014-02-02 NOTE — Transfer of Care (Signed)
Immediate Anesthesia Transfer of Care Note  Patient: Bridget Ball  Procedure(s) Performed: Procedure(s): RIGHT BREAST BIOPSY (Right)  Patient Location: PACU  Anesthesia Type:MAC  Level of Consciousness: awake, alert  and patient cooperative  Airway & Oxygen Therapy: Patient Spontanous Breathing and Patient connected to face mask oxygen  Post-op Assessment: Report given to RN, Post -op Vital signs reviewed and stable and Patient moving all extremities  Post vital signs: Reviewed and stable  Last Vitals:  Filed Vitals:   02/02/14 0855  BP: 101/63  Pulse:   Temp:   Resp: 15    Complications: No apparent anesthesia complications

## 2014-02-05 ENCOUNTER — Encounter (HOSPITAL_COMMUNITY): Payer: Self-pay | Admitting: General Surgery

## 2014-02-22 ENCOUNTER — Other Ambulatory Visit: Payer: Self-pay | Admitting: Neurology

## 2014-02-22 NOTE — Telephone Encounter (Signed)
Patient has appt next month

## 2014-02-26 ENCOUNTER — Encounter (INDEPENDENT_AMBULATORY_CARE_PROVIDER_SITE_OTHER): Payer: Self-pay | Admitting: Internal Medicine

## 2014-02-26 ENCOUNTER — Ambulatory Visit (INDEPENDENT_AMBULATORY_CARE_PROVIDER_SITE_OTHER): Payer: Medicare Other | Admitting: Internal Medicine

## 2014-02-26 VITALS — BP 104/64 | HR 80 | Temp 97.8°F | Resp 18 | Ht 61.0 in | Wt 100.3 lb

## 2014-02-26 DIAGNOSIS — K52839 Microscopic colitis, unspecified: Secondary | ICD-10-CM

## 2014-02-26 DIAGNOSIS — R634 Abnormal weight loss: Secondary | ICD-10-CM

## 2014-02-26 DIAGNOSIS — K2100 Gastro-esophageal reflux disease with esophagitis, without bleeding: Secondary | ICD-10-CM

## 2014-02-26 DIAGNOSIS — K5289 Other specified noninfective gastroenteritis and colitis: Secondary | ICD-10-CM

## 2014-02-26 NOTE — Patient Instructions (Signed)
Can use Glycerin. and/or Dulcolax suppository on as-needed basis for constipation. Can use Imodium OTC 2 mg daily as needed. Weight check in 2 months.

## 2014-02-26 NOTE — Progress Notes (Signed)
Presenting complaint;  Follow-up for diarrhea/microscopic colitis, erosive reflux esophagitis and weight loss.  Subjective:  Bridget Ball is 81 year old Caucasian female who presents for scheduled visit accompanied by her son Clair Gulling. She was last seen on 09/28/2013. She feels she is doing better. She has good appetite. She has heartburn only with certain foods. Last month she had more days with diarrhea and took Imodium. This month she only has taken 1 or 2 doses. On a few occasions she has felt constipated and got relief by drinking grapefruit juice. She states she has a bowel movement within 2 hours or so of drinking grapefruit juice. She also drinks 3-4 cans of and showed daily. However she does not eat fatty or fried food. She denies dysphagia nausea vomiting hoarseness or chronic cough. She also denies melena or rectal bleeding. Her son feels her memory has improved since she was last evaluated by Dr. Bunnie Pion. He is hoping that she would undergo repeat cognitive testing on her next visit.   Current Medications: Outpatient Encounter Prescriptions as of 02/26/2014  Medication Sig  . albuterol (PROVENTIL HFA;VENTOLIN HFA) 108 (90 BASE) MCG/ACT inhaler Inhale 2 puffs into the lungs every 6 (six) hours as needed for wheezing or shortness of breath.  . Albuterol Sulfate (PROAIR HFA IN) Inhale 90 mcg into the lungs. 2 puffs into lungs every 6 hourd as needed for wheezing or shortness of breathe  . atorvastatin (LIPITOR) 80 MG tablet Take 1 tablet (80 mg total) by mouth daily at 6 PM.  . budesonide-formoterol (SYMBICORT) 160-4.5 MCG/ACT inhaler Inhale 2 puffs into the lungs 2 (two) times daily.  . citalopram (CELEXA) 10 MG tablet Take 10 mg by mouth every morning.   . docusate sodium (COLACE) 100 MG capsule Take 100 mg by mouth daily as needed for mild constipation.  . feeding supplement, ENSURE COMPLETE, (ENSURE COMPLETE) LIQD Take 237 mLs by mouth 3 (three) times daily between meals.  . levETIRAcetam  (KEPPRA) 250 MG tablet TAKE 1 TABLET BY MOUTH TWICE A DAY  . Methylfol-Methylcob-Acetylcyst (CEREFOLIN NAC PO) Take by mouth daily.  . pantoprazole (PROTONIX) 40 MG tablet TAKE 1 TABLET BY MOUTH EVERY MORNING WITH BREAKFAST  . solifenacin (VESICARE) 5 MG tablet Take 5 mg by mouth daily.  Marland Kitchen tiotropium (SPIRIVA) 18 MCG inhalation capsule Place 18 mcg into inhaler and inhale at bedtime.  . [DISCONTINUED] HYDROcodone-acetaminophen (NORCO/VICODIN) 5-325 MG per tablet Take 1 tablet by mouth every 6 (six) hours as needed for moderate pain. (Bridget Ball not taking: Reported on 02/26/2014)  . [DISCONTINUED] ibuprofen (ADVIL,MOTRIN) 200 MG tablet Take 200-400 mg by mouth once as needed for headache.     Objective: Blood pressure 104/64, pulse 80, temperature 97.8 F (36.6 C), temperature source Oral, resp. rate 18, height 5\' 1"  (1.549 m), weight 100 lb 4.8 oz (45.496 kg). Bridget Ball is alert and in no acute distress. Conjunctiva is pink. Sclera is nonicteric Oropharyngeal mucosa is normal. No neck masses or thyromegaly noted. Cardiac exam with regular rhythm normal S1 and S2. No murmur or gallop noted. Lungs are clear to auscultation. Abdomen is symmetrical soft and nontender without organomegaly or masses. No LE edema or clubbing noted.  Labs/studies Results: Lab data from 02/02/2014  H&H 11.7 and 36.7  WBC 5.7 and platelet count 246K    Assessment:  #1. Diarrhea. She was found to have microscopic colitis last year in May and treated with budesonide. She is having formed stools on most days and using Imodium OTC on as-needed basis. Therefore I do  not believe microscopic colitis has relapsed. #2. GERD. She was found to have erosive reflux esophagitis on EGD of May 2015. Heartburn is well controlled with therapy. If there are any concerns raised by her neurologist will consider getting her off PPI. #3. Weight loss. Most of her weight loss occurred while she is having problems with urolithiasis and  infection. She has not lost any more weight but her weight has leveled off. Oral intake appears to be satisfactory according to her son Clair Gulling  Plan:  Weight check in 2 months. Bridget Ball will continue to use Imodium 2 mg daily when necessary. Bridget Ball advised not to take OTC laxatives for constipation but can use glycerin and are Dulcolax suppository on as-needed basis. She will check with her neurologist, Dr. Leonie Man if he has any concerns about continued PPI use. Office visit in 6 months.

## 2014-03-20 ENCOUNTER — Encounter (HOSPITAL_COMMUNITY): Payer: Self-pay | Admitting: Emergency Medicine

## 2014-03-20 ENCOUNTER — Emergency Department (HOSPITAL_COMMUNITY)
Admission: EM | Admit: 2014-03-20 | Discharge: 2014-03-20 | Disposition: A | Discharge status: Home / Self Care | Payer: Medicare Other | Attending: Emergency Medicine | Admitting: Emergency Medicine

## 2014-03-20 DIAGNOSIS — F329 Major depressive disorder, single episode, unspecified: Secondary | ICD-10-CM | POA: Insufficient documentation

## 2014-03-20 DIAGNOSIS — J449 Chronic obstructive pulmonary disease, unspecified: Secondary | ICD-10-CM | POA: Insufficient documentation

## 2014-03-20 DIAGNOSIS — Z87891 Personal history of nicotine dependence: Secondary | ICD-10-CM | POA: Insufficient documentation

## 2014-03-20 DIAGNOSIS — Z872 Personal history of diseases of the skin and subcutaneous tissue: Secondary | ICD-10-CM | POA: Diagnosis not present

## 2014-03-20 DIAGNOSIS — T148XXA Other injury of unspecified body region, initial encounter: Secondary | ICD-10-CM

## 2014-03-20 DIAGNOSIS — S51851A Open bite of right forearm, initial encounter: Secondary | ICD-10-CM | POA: Diagnosis present

## 2014-03-20 DIAGNOSIS — Z79899 Other long term (current) drug therapy: Secondary | ICD-10-CM | POA: Diagnosis not present

## 2014-03-20 DIAGNOSIS — W540XXA Bitten by dog, initial encounter: Secondary | ICD-10-CM | POA: Diagnosis not present

## 2014-03-20 DIAGNOSIS — S81852A Open bite, left lower leg, initial encounter: Secondary | ICD-10-CM | POA: Diagnosis not present

## 2014-03-20 DIAGNOSIS — Z8739 Personal history of other diseases of the musculoskeletal system and connective tissue: Secondary | ICD-10-CM | POA: Insufficient documentation

## 2014-03-20 DIAGNOSIS — F0390 Unspecified dementia without behavioral disturbance: Secondary | ICD-10-CM | POA: Insufficient documentation

## 2014-03-20 DIAGNOSIS — Z8719 Personal history of other diseases of the digestive system: Secondary | ICD-10-CM | POA: Diagnosis not present

## 2014-03-20 DIAGNOSIS — Y998 Other external cause status: Secondary | ICD-10-CM | POA: Diagnosis not present

## 2014-03-20 DIAGNOSIS — G40909 Epilepsy, unspecified, not intractable, without status epilepticus: Secondary | ICD-10-CM | POA: Insufficient documentation

## 2014-03-20 DIAGNOSIS — Z85828 Personal history of other malignant neoplasm of skin: Secondary | ICD-10-CM | POA: Diagnosis not present

## 2014-03-20 DIAGNOSIS — Z880 Allergy status to penicillin: Secondary | ICD-10-CM | POA: Insufficient documentation

## 2014-03-20 DIAGNOSIS — Y9389 Activity, other specified: Secondary | ICD-10-CM | POA: Diagnosis not present

## 2014-03-20 DIAGNOSIS — Z8673 Personal history of transient ischemic attack (TIA), and cerebral infarction without residual deficits: Secondary | ICD-10-CM | POA: Insufficient documentation

## 2014-03-20 DIAGNOSIS — Y9289 Other specified places as the place of occurrence of the external cause: Secondary | ICD-10-CM | POA: Insufficient documentation

## 2014-03-20 DIAGNOSIS — E785 Hyperlipidemia, unspecified: Secondary | ICD-10-CM | POA: Diagnosis not present

## 2014-03-20 DIAGNOSIS — I252 Old myocardial infarction: Secondary | ICD-10-CM | POA: Insufficient documentation

## 2014-03-20 NOTE — ED Notes (Addendum)
Skin tears to R forearm and L. thigh area cleaned and steri-strips applied. Wounds then covered with non-stick dressing and wrapped in kling.

## 2014-03-20 NOTE — ED Notes (Signed)
scratched by puppies.  Injury to right arm and left leg.  Denies pain.  Pt has skin tears to right forearm and left knee.

## 2014-03-20 NOTE — ED Provider Notes (Signed)
CSN: [id number]     Arrival date & time 03/20/14  1020 History   First MD Initiated Contact with Patient 03/20/14 1252     Chief Complaint  Patient presents with  . Leg Injury  . Arm Injury     HPI  Patient presents with concern of chest wounds she suffered yesterday. Patient was playing with puppies, had 2 wounds, one on the right forearm, one on the left lateral mid leg. No fall, trauma, head pain or injury. Since the event patient has had substantial irrigation of the wound, placement of dressing, by the patient's daughter. Active bleeding in both sides, but no other current complaints, including no fever, chills, nausea, vomiting.   Past Medical History  Diagnosis Date  . COPD (chronic obstructive pulmonary disease)   . Arthritis   . Depression   . Memory difficulties   . Chronic diarrhea   . Hiatal hernia   . Cancer     Skin cancer  . Hyperlipidemia   . Myocardial infarction     unknown time   . Shortness of breath     with exertion  . Seizures     x 2 last year due to bleeding to the brain.  . Stroke     TIA four years ago / stroke NOV 2014  . Bruises easily   . GERD (gastroesophageal reflux disease)   . Microscopic colitis   . Colitis   . Hydronephrosis, left   . Hx of septic shock JULY 2015  . Dementia   . Anxiety   . Hx of pulmonary edema JULY 2015  . Attention to nephrostomy     PT HAS NEPHROSTOMY TUBE IN PLACE  . Hx of bronchitis   . Hx of pulmonary edema JULY 2015   Past Surgical History  Procedure Laterality Date  . Appendectomy    . Skin cancer excision    . Tubal ligation    . Colonoscopy N/A 05/24/2013    Procedure: COLONOSCOPY;  Surgeon: Rogene Houston, MD;  Location: [facility];  Service: Endoscopy;  Laterality: N/A;  100  . Esophagogastroduodenoscopy N/A 05/24/2013    Procedure: ESOPHAGOGASTRODUODENOSCOPY (EGD);  Surgeon: Rogene Houston, MD;  Location: [facility];  Service: Endoscopy;  Laterality: N/A;  . Tonsillectomy    .  Cystoscopy with retrograde pyelogram, ureteroscopy and stent placement Left 09/18/2013    Procedure: CYSTOSCOPY WITH RETROGRADE PYELOGRAM,  URETEROSCOPY AND  STENT PLACEMENT, removal of nephrostomy tube;  Surgeon: Jorja Loa, MD;  Location: [facility];  Service: Urology;  Laterality: Left;  . Breast biopsy Right 02/02/2014    Procedure: RIGHT BREAST BIOPSY;  Surgeon: Jamesetta So, MD;  Location: [facility];  Service: General;  Laterality: Right;   Family History  Problem Relation Age of Onset  . Diabetes Mother    History  Substance Use Topics  . Smoking status: Former Smoker -- 1.00 packs/day for 50 years    Quit date: 11/13/1997  . Smokeless tobacco: Never Used  . Alcohol Use: 4.2 oz/week    7 Glasses of wine per week     Comment: 2 glasses a night wine (none in a year)   OB History    No data available     Review of Systems  All other systems reviewed and are negative.     Allergies  Penicillins  Home Medications   Prior to Admission medications   Medication Sig Start Date End Date Taking? Authorizing Provider  albuterol (PROVENTIL HFA;VENTOLIN  HFA) 108 (90 BASE) MCG/ACT inhaler Inhale 2 puffs into the lungs every 6 (six) hours as needed for wheezing or shortness of breath. 08/03/13  Yes Janece Canterbury, MD  atorvastatin (LIPITOR) 80 MG tablet Take 1 tablet (80 mg total) by mouth daily at 6 PM. 08/03/13  Yes Janece Canterbury, MD  budesonide-formoterol [facility]) 160-4.5 MCG/ACT inhaler Inhale 2 puffs into the lungs 2 (two) times daily.   Yes Historical Provider, MD  citalopram (CELEXA) 10 MG tablet Take 10 mg by mouth every morning.    Yes Historical Provider, MD  COMBIVENT RESPIMAT 20-100 MCG/ACT AERS respimat Inhale 1 puff into the lungs 2 (two) times daily. 03/02/14  Yes Historical Provider, MD  docusate sodium (COLACE) 100 MG capsule Take 100 mg by mouth daily as needed for mild constipation. 07/21/13  Yes Milton Ferguson, MD  feeding supplement, ENSURE COMPLETE, (ENSURE  COMPLETE) LIQD Take 237 mLs by mouth 3 (three) times daily between meals. 08/03/13  Yes Janece Canterbury, MD  levETIRAcetam (KEPPRA) 250 MG tablet TAKE 1 TABLET BY MOUTH TWICE A DAY 02/22/14  Yes Garvin Fila, MD  Methylfol-Methylcob-Acetylcyst (CEREFOLIN NAC PO) Take by mouth daily.   Yes Historical Provider, MD  pantoprazole (PROTONIX) 40 MG tablet TAKE 1 TABLET BY MOUTH EVERY MORNING WITH BREAKFAST 01/15/14  Yes Rogene Houston, MD  solifenacin (VESICARE) 5 MG tablet Take 5 mg by mouth daily.   Yes Historical Provider, MD   BP 117/70 mmHg  Pulse 65  Temp(Src) 98.2 F (36.8 C) (Oral)  Resp 18  Ht 5\' 1"  (1.549 m)  Wt 100 lb (45.36 kg)  BMI 18.90 kg/m2  SpO2 99% Physical Exam  Constitutional: She is oriented to person, place, and time. She appears well-developed and well-nourished. No distress.  HENT:  Head: Normocephalic and atraumatic.  Eyes: Conjunctivae and EOM are normal.  Cardiovascular: Normal rate, regular rhythm and intact distal pulses.   Pulmonary/Chest: Effort normal. No stridor. No respiratory distress.  Abdominal: She exhibits no distension.  Musculoskeletal: She exhibits no edema.  Distal to each wound the patient is  neurovascularly within normal limits   Neurological: She is alert and oriented to person, place, and time. No cranial nerve deficit.  Skin: Skin is warm and dry.     Psychiatric: She has a normal mood and affect.  Nursing note and vitals reviewed.   ED Course  Procedures (including critical care time) After the initial evaluation the patient had her home wound care dressings removed, the wounds cleaned, dressed, approximated by nursing staff. Patient tolerated this well, and on repeat exam was in no distress, ready to go.   MDM   Final diagnoses:  Multiple skin tears  Animal bite   patient presents after suffering several skin tears from small puppies. Patient received wound care, without complication, was discharged in stable  condition.  Carmin Muskrat, MD 03/20/14 [phone]

## 2014-03-20 NOTE — Discharge Instructions (Signed)
As discussed, it is important to keep all your wounds clean, dry, well-dressed.  Please leave the dressings in place for the next 48 hours. Subsequently, you may remove the outer most layer of the dressing, to ensure that there is no infection beginning.  Return here for concerning changes in your condition.

## 2014-03-21 ENCOUNTER — Encounter: Payer: Self-pay | Admitting: Neurology

## 2014-03-21 ENCOUNTER — Ambulatory Visit (INDEPENDENT_AMBULATORY_CARE_PROVIDER_SITE_OTHER): Payer: Medicare Other | Admitting: Neurology

## 2014-03-21 VITALS — BP 105/69 | HR 86 | Ht 61.0 in | Wt 102.0 lb

## 2014-03-21 DIAGNOSIS — R413 Other amnesia: Secondary | ICD-10-CM

## 2014-03-21 NOTE — Patient Instructions (Signed)
I had a long discussion with the patient and her daughter regards to a mild memory loss and cognitive impairment which appears quite stable. Continue Cerefolin nac and and Celexa for depression. She was unable to tolerate Exelon patches due to side effects. Continue Keppra 250 mg twice daily for seizures which also appear to be stable. Return for follow-up in 6 months or call earlier if necessary Memory Compensation Strategies  1. Use "WARM" strategy.  W= write it down  A= associate it  R= repeat it  M= make a mental note  2.   You can keep a Social worker.  Use a 3-ring notebook with sections for the following: calendar, important names and phone numbers,  medications, doctors' names/phone numbers, lists/reminders, and a section to journal what you did  each day.   3.    Use a calendar to write appointments down.  4.    Write yourself a schedule for the day.  This can be placed on the calendar or in a separate section of the Memory Notebook.  Keeping a  regular schedule can help memory.  5.    Use medication organizer with sections for each day or morning/evening pills.  You may need help loading it  6.    Keep a basket, or pegboard by the door.  Place items that you need to take out with you in the basket or on the pegboard.  You may also want to  include a message board for reminders.  7.    Use sticky notes.  Place sticky notes with reminders in a place where the task is performed.  For example: " turn off the  stove" placed by the stove, "lock the door" placed on the door at eye level, " take your medications" on  the bathroom mirror or by the place where you normally take your medications.  8.    Use alarms/timers.  Use while cooking to remind yourself to check on food or as a reminder to take your medicine, or as a  reminder to make a call, or as a reminder to perform another task, etc.

## 2014-03-21 NOTE — Progress Notes (Signed)
PATIENT: Bridget Ball DOB: 01/14/1928  REASON FOR VISIT: routine follow up for memory loss HISTORY FROM: patient, daughter  HISTORY OF PRESENT ILLNESS: Update 09/01/13 (LL): Since last visit patient started on Celexa and no longer has crying spells or depression symptoms. Short-term memory problems and confusion have persisted. Since last visit she got very sick with colitis, had ureter obstruction requiring nephrostomy tube, and then sepsis with an extended hospitalization. She was discharged to the [facility] in [place], with plans to discharge to daughter's home soon. She is still receiving IV antibiotics. She has had significant weight loss, but is regaining strength at rehabilitation. An intake assessment at the nursing center suggested early dementia and daughter and other family members have noticed problems too. Patient denies any problem with memory other than old age. Daughter would like to get her started on something to help her keep from regressing as much as possible and patient is agreeable to it. Geriatric depression score has improved from 10 at last visit to 1 today. MMSE score dropped 1 point from 27-26. Animal fluency score dropped from 12-10 today. In clock drawing score dropped from 4/4 to 2/4 today. She draws numbers backwards on the clock face and does not add in hands to show the time. She is unable to copy 3-dimensional figure.  Update 04/25/2013 ; She returns for followup of her last visit 3 months ago. She is accompanied by her daughter. The patient has not been taking Celexa consistently on regular basis and she states that she forgets to take the pills at times. She continues to have a mild memory difficulties. She is states that she is under significant stress because she has had diarrhea and stomach upset problems and she plans to have endoscopy soon. She's also lost 15 pounds within the last few months. Patient has reduced the dose of Keppra to 250 twice  daily and she's not had any episodes of sensory disturbance or seizures. She underwent EEG on 02/01/13 which was normal without any epileptiform activity. MRI scan of the brain on 02/10/13 shows remote age hemorrhage and blood products in the right parietal subarachnoid spaces which were improved and persistent right frontal hyperintensity which was also improved compared with previous scan from 11/20/2012. No acute abnormality was noted.   Initial Consult 01/24/2013 ; 66 year Caucasian lady seen for followup following hospital admission on 11/19/12 for subarachnoid hemorrhage and seizures. She presented with recurrent transient episodes of left body paresthesias and difficulty speaking. The episode began in the left arm and gradually over minutes progressed to involve the face and lasted several minutes only. At times she was unable to form words and speak but she could understand what other people were saying. She described this as a feeling of pins and needles but without any impairment of consciousness. She had similar episodes in 2010 when she had a similar localized right frontal convexity subarachnoid hemorrhage. She had had no head trauma or significant headaches or had bumped her head. And at times MRI scan had shown similar findings to the current one which showed localized blood in the right frontal convexity with some diffusion positive changes suggesting microinfarction adjacent. She previously has cerebral catheter angiogram performed which showed no evidence of underlying AVM, aneurysms or vascular abnormalities. She was started on Neurontin for seizures and did well for a while but then gradually decided to stop the medication she did follow up with me for 1 year but subsequently was lost to followup.  She had EEG during the previous admission and 2010 which did not show definite seizure activity.She had a repeat EEG on 11/21/12 which was normal. CT angiogram of the brain on 11/16/14and showed no  evidence of underlying aneurysm or vascular abnormality. Urine drug screen was negative. Patient was started on Keppra initially 500 twice a day subsequent when episodes persisted was increased to thousand twice a day and Depakote ER was added. Patient states she's did well since discharge and has not had recurrent episodes of numbness however she has had confusion and agitation issues and hence has stopped the Depakote and in fact has even reduce the Keppra to 500 twice daily. Patient complains of short-term problems as well as intermittent confusion, balance issues. She forgets how to use a credit card and daughter has to help her in on several occasions. She previously had home so [place] which appear to be healing now and she does see a home care nurse twice a week. She hasn't never been diagnosed in the past with depression or been on any medication for that. She denies headache, focal extremity weakness, numbness or vision problems.  Update 03/21/2014 : She returns for follow-up today after last visit 6 months ago. She was started on Patch at last visit but did not tolerate it well due to behavioral outbursts and it was discontinued. She was hospitalized with kidney infection and septic shock and almost died. She underwent kidney stent and had a prolonged stay at pain Center for rehabilitation. She is doing a lot better now. She does take Cerefolin every day which seems to help. When she ran out of Cerefolin did notice cognitive worsening. She also is on Celexa for depression which keeps her mood stable. She has no new complaints today. On Mini-Mental status exam today she scored 30/30 which is an improvement from 27/30 at last visit. She has not had any seizures and is tolerating Keppra well without side effects ROS:  14 system review of systems is positive for  shortness of breath, frequency of urination, easy bruising, bleeding, memory loss, confusion, recent kidney infection and renal stent. and all  other systems negative    ALLERGIES: Allergies  Allergen Reactions  . Penicillins Anaphylaxis    Throat swelling as a child. Tolerated ceftriaxone 07/29/13.    HOME MEDICATIONS: Outpatient Prescriptions Prior to Visit  Medication Sig Dispense Refill  . albuterol (PROVENTIL HFA;VENTOLIN HFA) 108 (90 BASE) MCG/ACT inhaler Inhale 2 puffs into the lungs every 6 (six) hours as needed for wheezing or shortness of breath. 1 Inhaler 0  . atorvastatin (LIPITOR) 80 MG tablet Take 1 tablet (80 mg total) by mouth daily at 6 PM. 30 tablet 0  . budesonide-formoterol (SYMBICORT) 160-4.5 MCG/ACT inhaler Inhale 2 puffs into the lungs 2 (two) times daily.    . citalopram (CELEXA) 10 MG tablet Take 10 mg by mouth every morning.     . COMBIVENT RESPIMAT 20-100 MCG/ACT AERS respimat Inhale 1 puff into the lungs 2 (two) times daily.  5  . docusate sodium (COLACE) 100 MG capsule Take 100 mg by mouth daily as needed for mild constipation.    . feeding supplement, ENSURE COMPLETE, (ENSURE COMPLETE) LIQD Take 237 mLs by mouth 3 (three) times daily between meals. 90 Bottle 0  . levETIRAcetam (KEPPRA) 250 MG tablet TAKE 1 TABLET BY MOUTH TWICE A DAY 60 tablet 0  . Methylfol-Methylcob-Acetylcyst (CEREFOLIN NAC PO) Take by mouth daily.    . pantoprazole (PROTONIX) 40 MG tablet TAKE 1 TABLET  BY MOUTH EVERY MORNING WITH BREAKFAST 30 tablet 5  . solifenacin (VESICARE) 5 MG tablet Take 5 mg by mouth daily.     No facility-administered medications prior to visit.    PHYSICAL EXAM Filed Vitals:   03/21/14 1034  BP: 105/69  Pulse: 86  Height: 5\' 1"  (1.549 m)  Weight: 102 lb (46.267 kg)   Body mass index is 19.28 kg/(m^2). No exam data present No flowsheet data found.  MMSE - Mini Mental State Exam 03/21/2014 09/01/2013 04/25/2013  Orientation to time 5 2 4   Orientation to Place 5 5 5   Registration 3 3 3   Attention/ Calculation 5 5 5   Recall 3 3 1   Language- name 2 objects 2 2 2   Language- repeat 1 1 1     Language- follow 3 step command 3 3 3   Language- read & follow direction 1 1 1   Write a sentence 1 1 1   Copy design 1 0 1  Total score 30 26 27     Physical Exam  General:  , frail elderly Caucasian lady, seated, in no evident distress  Head: head normocephalic and atraumatic. Orohparynx benign  Neck: supple with no carotid or supraclavicular bruits  Cardiovascular: regular rate and rhythm, no murmurs  Musculoskeletal: no deformity  Skin: no rash/petichiae  Vascular: Normal pulses all extremities   Neurologic Exam  Mental Status: Awake and fully alert. Oriented to place and time. Recent and remote memory grossly intact. Attention span, concentration and fund of knowledge appropriate. Mood and affect appear normal. Mini-Mental status exam scored 30/30 today without deficits. Animal naming test 11. Geriatric depression scale 2 not depressed. Clock drawing 3/4. Cranial Nerves: Fundoscopic exam not done. Pupils equal, briskly reactive to light. Extraocular movements full without nystagmus. Visual fields full to confrontation. Hearing intact. Facial sensation intact. Face, tongue, palate moves normally and symmetrically.  Motor: Normal bulk and tone. Normal strength in all tested extremity muscles.  Sensory: intact to light touch in all 4 extremities. Coordination: Rapid alternating movements normal in all extremities. Finger-to-nose and heel-to-shin performed accurately bilaterally.  Gait and Station: Arises from chair without difficulty. Stance is normal. Gait demonstrates normal stride length, unsteady balance. Not able to heel, toe and tandem walk without difficulty.  Reflexes: 1+ and symmetric. Toes downgoing.   ASSESSMENT:  8 year Caucasian lady with a recurrent right frontal convexity localized tiny subarachnoid hemorrhage with simple partial seizures. Prior history of similar episode in 2010 with cerebral catheter angiogram showing no underlying vascular abnormality. Persistent mild  confusion and memory loss likely early Alzheimer's Dementia, as depression has been treated. But both appear stable now.  PLAN:  I had a long discussion with the patient and her daughter regards to a mild memory loss and cognitive impairment which appears quite stable. Continue Cerefolin nac and and Celexa for depression. She was unable to tolerate Exelon patches due to side effects. Continue Keppra 250 mg twice daily for seizures which also appear to be stable. Return for follow-up in 6 months or call earlier if necessary   Meds ordered this encounter  Medications  .                          Marland Kitchen                           Return in about 6 months (around 09/21/2014).  Antony Contras, MD  03/21/2014, 11:01 AM [facility]  [address], [place], [place] [address] [phone]  Note: This document was prepared with digital dictation and possible smart phrase technology. Any transcriptional errors that result from this process are unintentional.

## 2014-05-17 ENCOUNTER — Ambulatory Visit (HOSPITAL_COMMUNITY)
Admission: RE | Admit: 2014-05-17 | Discharge: 2014-05-17 | Disposition: A | Discharge status: Home / Self Care | Payer: Medicare Other | Source: Ambulatory Visit | Attending: Urology | Admitting: Urology

## 2014-05-17 DIAGNOSIS — N1330 Unspecified hydronephrosis: Secondary | ICD-10-CM | POA: Diagnosis present

## 2014-05-22 ENCOUNTER — Ambulatory Visit (INDEPENDENT_AMBULATORY_CARE_PROVIDER_SITE_OTHER): Payer: Medicare Other | Admitting: Urology

## 2014-05-22 DIAGNOSIS — N1330 Unspecified hydronephrosis: Secondary | ICD-10-CM | POA: Diagnosis not present

## 2014-06-12 ENCOUNTER — Observation Stay (HOSPITAL_COMMUNITY)
Admission: EM | Admit: 2014-06-12 | Discharge: 2014-06-13 | Disposition: A | Discharge status: Home / Self Care | Payer: Medicare Other | Attending: Family Medicine | Admitting: Family Medicine

## 2014-06-12 ENCOUNTER — Emergency Department (HOSPITAL_COMMUNITY): Payer: Medicare Other

## 2014-06-12 ENCOUNTER — Encounter (HOSPITAL_COMMUNITY): Payer: Self-pay | Admitting: Emergency Medicine

## 2014-06-12 DIAGNOSIS — Z8673 Personal history of transient ischemic attack (TIA), and cerebral infarction without residual deficits: Secondary | ICD-10-CM | POA: Insufficient documentation

## 2014-06-12 DIAGNOSIS — Z87891 Personal history of nicotine dependence: Secondary | ICD-10-CM | POA: Insufficient documentation

## 2014-06-12 DIAGNOSIS — M1990 Unspecified osteoarthritis, unspecified site: Secondary | ICD-10-CM | POA: Diagnosis not present

## 2014-06-12 DIAGNOSIS — Z880 Allergy status to penicillin: Secondary | ICD-10-CM | POA: Diagnosis not present

## 2014-06-12 DIAGNOSIS — E785 Hyperlipidemia, unspecified: Secondary | ICD-10-CM | POA: Diagnosis not present

## 2014-06-12 DIAGNOSIS — N1330 Unspecified hydronephrosis: Secondary | ICD-10-CM | POA: Diagnosis not present

## 2014-06-12 DIAGNOSIS — Z79899 Other long term (current) drug therapy: Secondary | ICD-10-CM | POA: Insufficient documentation

## 2014-06-12 DIAGNOSIS — Z7951 Long term (current) use of inhaled steroids: Secondary | ICD-10-CM | POA: Insufficient documentation

## 2014-06-12 DIAGNOSIS — K529 Noninfective gastroenteritis and colitis, unspecified: Secondary | ICD-10-CM | POA: Diagnosis not present

## 2014-06-12 DIAGNOSIS — K5289 Other specified noninfective gastroenteritis and colitis: Secondary | ICD-10-CM | POA: Insufficient documentation

## 2014-06-12 DIAGNOSIS — K219 Gastro-esophageal reflux disease without esophagitis: Secondary | ICD-10-CM | POA: Insufficient documentation

## 2014-06-12 DIAGNOSIS — J9601 Acute respiratory failure with hypoxia: Secondary | ICD-10-CM | POA: Diagnosis not present

## 2014-06-12 DIAGNOSIS — F0390 Unspecified dementia without behavioral disturbance: Secondary | ICD-10-CM | POA: Insufficient documentation

## 2014-06-12 DIAGNOSIS — J9801 Acute bronchospasm: Secondary | ICD-10-CM | POA: Diagnosis present

## 2014-06-12 DIAGNOSIS — T7840XA Allergy, unspecified, initial encounter: Secondary | ICD-10-CM | POA: Diagnosis not present

## 2014-06-12 DIAGNOSIS — I252 Old myocardial infarction: Secondary | ICD-10-CM | POA: Diagnosis not present

## 2014-06-12 DIAGNOSIS — R0902 Hypoxemia: Secondary | ICD-10-CM

## 2014-06-12 DIAGNOSIS — K449 Diaphragmatic hernia without obstruction or gangrene: Secondary | ICD-10-CM | POA: Diagnosis not present

## 2014-06-12 DIAGNOSIS — F419 Anxiety disorder, unspecified: Secondary | ICD-10-CM | POA: Insufficient documentation

## 2014-06-12 DIAGNOSIS — F329 Major depressive disorder, single episode, unspecified: Secondary | ICD-10-CM | POA: Diagnosis not present

## 2014-06-12 DIAGNOSIS — R21 Rash and other nonspecific skin eruption: Secondary | ICD-10-CM | POA: Insufficient documentation

## 2014-06-12 DIAGNOSIS — J441 Chronic obstructive pulmonary disease with (acute) exacerbation: Secondary | ICD-10-CM | POA: Insufficient documentation

## 2014-06-12 DIAGNOSIS — F32A Depression, unspecified: Secondary | ICD-10-CM | POA: Diagnosis present

## 2014-06-12 DIAGNOSIS — R413 Other amnesia: Secondary | ICD-10-CM | POA: Diagnosis present

## 2014-06-12 LAB — COMPREHENSIVE METABOLIC PANEL
ALK PHOS: 49 U/L (ref 38–126)
ALT: 17 U/L (ref 14–54)
AST: 21 U/L (ref 15–41)
Albumin: 3.9 g/dL (ref 3.5–5.0)
Anion gap: 9 (ref 5–15)
BUN: 16 mg/dL (ref 6–20)
CHLORIDE: 106 mmol/L (ref 101–111)
CO2: 25 mmol/L (ref 22–32)
Calcium: 9.1 mg/dL (ref 8.9–10.3)
Creatinine, Ser: 0.95 mg/dL (ref 0.44–1.00)
GFR calc Af Amer: >60 mL/min (ref >60)
GFR calc non Af Amer: 54 mL/min — ABNORMAL LOW (ref >60)
Glucose, Bld: 117 mg/dL — ABNORMAL HIGH (ref 65–99)
POTASSIUM: 3.7 mmol/L (ref 3.5–5.1)
Sodium: 140 mmol/L (ref 135–145)
Total Bilirubin: 0.4 mg/dL (ref 0.3–1.2)
Total Protein: 6.6 g/dL (ref 6.5–8.1)

## 2014-06-12 LAB — CBC WITH DIFFERENTIAL/PLATELET
BASOS ABS: 0 10*3/uL (ref 0.0–0.1)
Basophils Relative: 0 % (ref 0–1)
EOS PCT: 2 % (ref 0–5)
Eosinophils Absolute: 0.1 10*3/uL (ref 0.0–0.7)
HEMATOCRIT: 38.6 % (ref 36.0–46.0)
Hemoglobin: 12.6 g/dL (ref 12.0–15.0)
LYMPHS ABS: 1.3 10*3/uL (ref 0.7–4.0)
LYMPHS PCT: 26 % (ref 12–46)
MCH: 32 pg (ref 26.0–34.0)
MCHC: 32.6 g/dL (ref 30.0–36.0)
MCV: 98 fL (ref 78.0–100.0)
MONOS PCT: 8 % (ref 3–12)
Monocytes Absolute: 0.4 10*3/uL (ref 0.1–1.0)
NEUTROS PCT: 64 % (ref 43–77)
Neutro Abs: 3.3 10*3/uL (ref 1.7–7.7)
Platelets: 240 10*3/uL (ref 150–400)
RBC: 3.94 MIL/uL (ref 3.87–5.11)
RDW: 14.4 % (ref 11.5–15.5)
WBC: 5.2 10*3/uL (ref 4.0–10.5)

## 2014-06-12 LAB — TROPONIN I: Troponin I: <0.03 ng/mL (ref <0.031)

## 2014-06-12 LAB — BRAIN NATRIURETIC PEPTIDE: B Natriuretic Peptide: 14 pg/mL (ref 0.0–100.0)

## 2014-06-12 MED ORDER — ALBUTEROL SULFATE (2.5 MG/3ML) 0.083% IN NEBU
2.5000 mg | INHALATION_SOLUTION | Freq: Once | RESPIRATORY_TRACT | Status: AC
  Administered 2014-06-12: 2.5 mg via RESPIRATORY_TRACT
  Filled 2014-06-12: qty 3

## 2014-06-12 MED ORDER — DOCUSATE SODIUM 100 MG PO CAPS
100.0000 mg | ORAL_CAPSULE | Freq: Two times a day (BID) | ORAL | Status: DC
  Administered 2014-06-12 – 2014-06-13 (×3): 100 mg via ORAL
  Filled 2014-06-12 (×3): qty 1

## 2014-06-12 MED ORDER — ONDANSETRON HCL 4 MG PO TABS: 4.0000 mg | ORAL_TABLET | Freq: Four times a day (QID) | ORAL | Status: DC | PRN

## 2014-06-12 MED ORDER — TRAZODONE HCL 50 MG PO TABS
25.0000 mg | ORAL_TABLET | Freq: Every evening | ORAL | Status: DC | PRN
  Administered 2014-06-13: 25 mg via ORAL
  Filled 2014-06-12: qty 1

## 2014-06-12 MED ORDER — POLYETHYLENE GLYCOL 3350 17 G PO PACK: 17.0000 g | PACK | Freq: Every day | ORAL | Status: DC | PRN

## 2014-06-12 MED ORDER — ALBUTEROL SULFATE (2.5 MG/3ML) 0.083% IN NEBU: 5.0000 mg | INHALATION_SOLUTION | Freq: Once | RESPIRATORY_TRACT | Status: DC

## 2014-06-12 MED ORDER — DIPHENHYDRAMINE HCL 50 MG/ML IJ SOLN
25.0000 mg | Freq: Once | INTRAMUSCULAR | Status: AC
  Administered 2014-06-12: 25 mg via INTRAVENOUS
  Filled 2014-06-12: qty 1

## 2014-06-12 MED ORDER — IPRATROPIUM BROMIDE 0.02 % IN SOLN: 0.5000 mg | Freq: Once | RESPIRATORY_TRACT | Status: DC

## 2014-06-12 MED ORDER — DIPHENHYDRAMINE HCL 50 MG/ML IJ SOLN: 12.5000 mg | Freq: Three times a day (TID) | INTRAMUSCULAR | Status: DC | PRN

## 2014-06-12 MED ORDER — BISACODYL 10 MG RE SUPP: 10.0000 mg | Freq: Every day | RECTAL | Status: DC | PRN

## 2014-06-12 MED ORDER — ALUM & MAG HYDROXIDE-SIMETH 200-200-20 MG/5ML PO SUSP
30.0000 mL | Freq: Four times a day (QID) | ORAL | Status: DC | PRN
  Administered 2014-06-13: 30 mL via ORAL
  Filled 2014-06-12: qty 30

## 2014-06-12 MED ORDER — BUDESONIDE-FORMOTEROL FUMARATE 160-4.5 MCG/ACT IN AERO
2.0000 | INHALATION_SPRAY | Freq: Two times a day (BID) | RESPIRATORY_TRACT | Status: DC
  Administered 2014-06-12 – 2014-06-13 (×3): 2 via RESPIRATORY_TRACT
  Filled 2014-06-12: qty 6

## 2014-06-12 MED ORDER — METHYLPREDNISOLONE SODIUM SUCC 40 MG IJ SOLR
40.0000 mg | Freq: Two times a day (BID) | INTRAMUSCULAR | Status: AC
  Administered 2014-06-12 – 2014-06-13 (×3): 40 mg via INTRAVENOUS
  Filled 2014-06-12 (×3): qty 1

## 2014-06-12 MED ORDER — CITALOPRAM HYDROBROMIDE 20 MG PO TABS
10.0000 mg | ORAL_TABLET | Freq: Every morning | ORAL | Status: DC
  Administered 2014-06-12 – 2014-06-13 (×2): 10 mg via ORAL
  Filled 2014-06-12 (×2): qty 1

## 2014-06-12 MED ORDER — FLEET ENEMA 7-19 GM/118ML RE ENEM: 1.0000 | ENEMA | Freq: Once | RECTAL | Status: AC | PRN

## 2014-06-12 MED ORDER — ONDANSETRON HCL 4 MG/2ML IJ SOLN: 4.0000 mg | Freq: Four times a day (QID) | INTRAMUSCULAR | Status: DC | PRN

## 2014-06-12 MED ORDER — ACETAMINOPHEN 650 MG RE SUPP: 650.0000 mg | Freq: Four times a day (QID) | RECTAL | Status: DC | PRN

## 2014-06-12 MED ORDER — IPRATROPIUM-ALBUTEROL 0.5-2.5 (3) MG/3ML IN SOLN
3.0000 mL | RESPIRATORY_TRACT | Status: DC
  Administered 2014-06-12: 3 mL via RESPIRATORY_TRACT
  Filled 2014-06-12: qty 3

## 2014-06-12 MED ORDER — ENOXAPARIN SODIUM 30 MG/0.3ML ~~LOC~~ SOLN
30.0000 mg | SUBCUTANEOUS | Status: DC
  Administered 2014-06-12 – 2014-06-13 (×2): 30 mg via SUBCUTANEOUS
  Filled 2014-06-12 (×2): qty 0.3

## 2014-06-12 MED ORDER — CETYLPYRIDINIUM CHLORIDE 0.05 % MT LIQD
7.0000 mL | Freq: Two times a day (BID) | OROMUCOSAL | Status: DC
  Administered 2014-06-12 – 2014-06-13 (×3): 7 mL via OROMUCOSAL

## 2014-06-12 MED ORDER — PANTOPRAZOLE SODIUM 40 MG PO TBEC
40.0000 mg | DELAYED_RELEASE_TABLET | Freq: Every day | ORAL | Status: DC
  Administered 2014-06-13: 40 mg via ORAL
  Filled 2014-06-12: qty 1

## 2014-06-12 MED ORDER — IPRATROPIUM-ALBUTEROL 0.5-2.5 (3) MG/3ML IN SOLN
3.0000 mL | Freq: Two times a day (BID) | RESPIRATORY_TRACT | Status: DC
  Administered 2014-06-12 – 2014-06-13 (×2): 3 mL via RESPIRATORY_TRACT
  Filled 2014-06-12 (×2): qty 3

## 2014-06-12 MED ORDER — METHYLPREDNISOLONE SODIUM SUCC 125 MG IJ SOLR
125.0000 mg | Freq: Once | INTRAMUSCULAR | Status: AC
  Administered 2014-06-12: 125 mg via INTRAVENOUS
  Filled 2014-06-12: qty 2

## 2014-06-12 MED ORDER — IPRATROPIUM-ALBUTEROL 0.5-2.5 (3) MG/3ML IN SOLN
3.0000 mL | Freq: Once | RESPIRATORY_TRACT | Status: AC
  Administered 2014-06-12: 3 mL via RESPIRATORY_TRACT
  Filled 2014-06-12: qty 3

## 2014-06-12 MED ORDER — FAMOTIDINE IN NACL 20-0.9 MG/50ML-% IV SOLN
20.0000 mg | Freq: Once | INTRAVENOUS | Status: AC
  Administered 2014-06-12: 20 mg via INTRAVENOUS
  Filled 2014-06-12: qty 50

## 2014-06-12 MED ORDER — SODIUM CHLORIDE 0.9 % IV SOLN
INTRAVENOUS | Status: AC
  Administered 2014-06-12: 12:00:00 via INTRAVENOUS

## 2014-06-12 MED ORDER — HYDROCODONE-ACETAMINOPHEN 5-325 MG PO TABS: 1.0000 | ORAL_TABLET | ORAL | Status: DC | PRN

## 2014-06-12 MED ORDER — ACETAMINOPHEN 325 MG PO TABS
650.0000 mg | ORAL_TABLET | Freq: Four times a day (QID) | ORAL | Status: DC | PRN
  Administered 2014-06-12: 650 mg via ORAL
  Filled 2014-06-12: qty 2

## 2014-06-12 MED ORDER — LEVETIRACETAM 250 MG PO TABS
250.0000 mg | ORAL_TABLET | Freq: Two times a day (BID) | ORAL | Status: DC
  Administered 2014-06-12 – 2014-06-13 (×3): 250 mg via ORAL
  Filled 2014-06-12 (×3): qty 1

## 2014-06-12 MED ORDER — ENSURE ENLIVE PO LIQD
237.0000 mL | Freq: Three times a day (TID) | ORAL | Status: DC
  Administered 2014-06-12 – 2014-06-13 (×4): 237 mL via ORAL

## 2014-06-12 MED ORDER — DARIFENACIN HYDROBROMIDE ER 7.5 MG PO TB24
7.5000 mg | ORAL_TABLET | Freq: Every day | ORAL | Status: DC
  Administered 2014-06-12 – 2014-06-13 (×2): 7.5 mg via ORAL
  Filled 2014-06-12 (×2): qty 1

## 2014-06-12 NOTE — ED Notes (Signed)
Pt ambulated to bathroom on room air without difficulty.  No visible respiratory distress.  ra sats when back in bed 82% with good waveform.  Placed back on 2 liters oxygen and sats up to 96%.

## 2014-06-12 NOTE — ED Notes (Signed)
Pt c/o itching and sob that started this am.

## 2014-06-12 NOTE — ED Provider Notes (Signed)
CSN: [id number]     Arrival date & time 06/12/14  0415 History   First MD Initiated Contact with Patient 06/12/14 [phone]     Chief Complaint  Patient presents with  . Allergic Reaction     (Consider location/radiation/quality/duration/timing/severity/associated sxs/prior Treatment) HPI  Patient states about 4 AM she started having shortness of breath, hoarseness, and itching with redness of her skin. She denies coughing or fever. She denies eating anything new or being exposed to anything different. She states she's never had this happen before. She states she's never had breathing trouble before and states she does not believe she has COPD.  PCP Dr Merlyn Albert  Past Medical History  Diagnosis Date  . COPD (chronic obstructive pulmonary disease)   . Arthritis   . Depression   . Memory difficulties   . Chronic diarrhea   . Hiatal hernia   . Cancer     Skin cancer  . Hyperlipidemia   . Myocardial infarction     unknown time   . Shortness of breath     with exertion  . Seizures     x 2 last year due to bleeding to the brain.  . Stroke     TIA four years ago / stroke NOV 2014  . Bruises easily   . GERD (gastroesophageal reflux disease)   . Microscopic colitis   . Colitis   . Hydronephrosis, left   . Hx of septic shock JULY 2015  . Dementia   . Anxiety   . Hx of pulmonary edema JULY 2015  . Attention to nephrostomy     PT HAS NEPHROSTOMY TUBE IN PLACE  . Hx of bronchitis   . Hx of pulmonary edema JULY 2015   Past Surgical History  Procedure Laterality Date  . Appendectomy    . Skin cancer excision    . Tubal ligation    . Colonoscopy N/A 05/24/2013    Procedure: COLONOSCOPY;  Surgeon: Rogene Houston, MD;  Location: [facility];  Service: Endoscopy;  Laterality: N/A;  100  . Esophagogastroduodenoscopy N/A 05/24/2013    Procedure: ESOPHAGOGASTRODUODENOSCOPY (EGD);  Surgeon: Rogene Houston, MD;  Location: [facility];  Service: Endoscopy;  Laterality: N/A;  .  Tonsillectomy    . Cystoscopy with retrograde pyelogram, ureteroscopy and stent placement Left 09/18/2013    Procedure: CYSTOSCOPY WITH RETROGRADE PYELOGRAM,  URETEROSCOPY AND  STENT PLACEMENT, removal of nephrostomy tube;  Surgeon: Jorja Loa, MD;  Location: [facility];  Service: Urology;  Laterality: Left;  . Breast biopsy Right 02/02/2014    Procedure: RIGHT BREAST BIOPSY;  Surgeon: Jamesetta So, MD;  Location: [facility];  Service: General;  Laterality: Right;   Family History  Problem Relation Age of Onset  . Diabetes Mother    History  Substance Use Topics  . Smoking status: Former Smoker -- 1.00 packs/day for 50 years    Quit date: 11/13/1997  . Smokeless tobacco: Never Used  . Alcohol Use: 4.2 oz/week    7 Glasses of wine per week     Comment: 2 glasses a night wine (none in a year)   lives at home Lives alone   [place] History    No data available     Review of Systems  All other systems reviewed and are negative.     Allergies  Penicillins  Home Medications   Prior to Admission medications   Medication Sig Start Date End Date Taking? Authorizing Provider  albuterol (PROVENTIL HFA;VENTOLIN  HFA) 108 (90 BASE) MCG/ACT inhaler Inhale 2 puffs into the lungs every 6 (six) hours as needed for wheezing or shortness of breath. 08/03/13   Janece Canterbury, MD  atorvastatin (LIPITOR) 80 MG tablet Take 1 tablet (80 mg total) by mouth daily at 6 PM. 08/03/13   Janece Canterbury, MD  budesonide-formoterol [facility]) 160-4.5 MCG/ACT inhaler Inhale 2 puffs into the lungs 2 (two) times daily.    Historical Provider, MD  citalopram (CELEXA) 10 MG tablet Take 10 mg by mouth every morning.     Historical Provider, MD  COMBIVENT RESPIMAT 20-100 MCG/ACT AERS respimat Inhale 1 puff into the lungs 2 (two) times daily. 03/02/14   Historical Provider, MD  docusate sodium (COLACE) 100 MG capsule Take 100 mg by mouth daily as needed for mild constipation. 07/21/13   Milton Ferguson, MD  feeding  supplement, ENSURE COMPLETE, (ENSURE COMPLETE) LIQD Take 237 mLs by mouth 3 (three) times daily between meals. 08/03/13   Janece Canterbury, MD  levETIRAcetam (KEPPRA) 250 MG tablet TAKE 1 TABLET BY MOUTH TWICE A DAY 02/22/14   Garvin Fila, MD  Methylfol-Methylcob-Acetylcyst (CEREFOLIN NAC PO) Take by mouth daily.    Historical Provider, MD  pantoprazole (PROTONIX) 40 MG tablet TAKE 1 TABLET BY MOUTH EVERY MORNING WITH BREAKFAST 01/15/14   Rogene Houston, MD  solifenacin (VESICARE) 5 MG tablet Take 5 mg by mouth daily.    Historical Provider, MD   BP 135/118 mmHg  Pulse 120  Temp(Src) 97.6 F (36.4 C)  Resp 18  Ht 5' (1.524 m)  Wt 104 lb (47.174 kg)  BMI 20.31 kg/m2  SpO2 88%  Vital signs normal except for hypoxia and tachycardia  Physical Exam  Constitutional: She is oriented to person, place, and time.  Non-toxic appearance. She does not appear ill. She appears distressed.  Very thin frail elderly female  HENT:  Head: Normocephalic and atraumatic.  Right Ear: External ear normal.  Left Ear: External ear normal.  Nose: Nose normal. No mucosal edema or rhinorrhea.  Mouth/Throat: Oropharynx is clear and moist and mucous membranes are normal. No dental abscesses or uvula swelling.  Eyes: Conjunctivae and EOM are normal. Pupils are equal, round, and reactive to light.  Neck: Normal range of motion and full passive range of motion without pain. Neck supple.  Cardiovascular: Normal rate, regular rhythm and normal heart sounds.  Exam reveals no gallop and no friction rub.   No murmur heard. Pulmonary/Chest: Tachypnea noted. She is in respiratory distress. She has decreased breath sounds. She has wheezes. She has no rhonchi. She has no rales. She exhibits no tenderness and no crepitus.  Abdominal: Soft. Normal appearance and bowel sounds are normal. She exhibits no distension. There is no tenderness. There is no rebound and no guarding.  Musculoskeletal: Normal range of motion. She exhibits  no edema or tenderness.  Moves all extremities well.   Neurological: She is alert and oriented to person, place, and time. She has normal strength. No cranial nerve deficit.  Skin: Skin is warm, dry and intact. Rash noted. There is erythema. No pallor.  Patient has diffuse redness of the skin on her arms and her thighs and abdomen and chest.  Psychiatric: She has a normal mood and affect. Her speech is normal and behavior is normal. Her mood appears not anxious.  Nursing note and vitals reviewed.   ED Course  Procedures (including critical care time) Medications  methylPREDNISolone sodium succinate (SOLU-MEDROL) 125 mg/2 mL injection 125 mg (125 mg  Intravenous Given 06/12/14 0455)  famotidine (PEPCID) IVPB 20 mg premix (0 mg Intravenous Stopped 06/12/14 0527)  diphenhydrAMINE (BENADRYL) injection 25 mg (25 mg Intravenous Given 06/12/14 0455)  ipratropium-albuterol (DUONEB) 0.5-2.5 (3) MG/3ML nebulizer solution 3 mL (3 mLs Nebulization Given 06/12/14 0535)  albuterol (PROVENTIL) (2.5 MG/3ML) 0.083% nebulizer solution 2.5 mg (2.5 mg Nebulization Given 06/12/14 0535)   Recheck 05:30 pt states her itching is better and her breathing is better.    Recheck 06:35  oxygen turned off, pulse ox dropped to 88-92%. Oxygen put back on, discussed admission and she is agreeable. Rash is almost gone, no longer itching.  Lungs are clear.   07:16 Dr Jerilee Hoh, admit to observation telemetry  Labs Review Results for orders placed or performed during the hospital encounter of 06/12/14  Comprehensive metabolic panel  Result Value Ref Range   Sodium 140 135 - 145 mmol/L   Potassium 3.7 3.5 - 5.1 mmol/L   Chloride 106 101 - 111 mmol/L   CO2 25 22 - 32 mmol/L   Glucose, Bld 117 (H) 65 - 99 mg/dL   BUN 16 6 - 20 mg/dL   Creatinine, Ser 0.95 0.44 - 1.00 mg/dL   Calcium 9.1 8.9 - 10.3 mg/dL   Total Protein 6.6 6.5 - 8.1 g/dL   Albumin 3.9 3.5 - 5.0 g/dL   AST 21 15 - 41 U/L   ALT 17 14 - 54 U/L   Alkaline  Phosphatase 49 38 - 126 U/L   Total Bilirubin 0.4 0.3 - 1.2 mg/dL   GFR calc non Af Amer 54 (L) >60 mL/min   GFR calc Af Amer >60 >60 mL/min   Anion gap 9 5 - 15  CBC with Differential  Result Value Ref Range   WBC 5.2 4.0 - 10.5 K/uL   RBC 3.94 3.87 - 5.11 MIL/uL   Hemoglobin 12.6 12.0 - 15.0 g/dL   HCT 38.6 36.0 - 46.0 %   MCV 98.0 78.0 - 100.0 fL   MCH 32.0 26.0 - 34.0 pg   MCHC 32.6 30.0 - 36.0 g/dL   RDW 14.4 11.5 - 15.5 %   Platelets 240 150 - 400 K/uL   Neutrophils Relative % 64 43 - 77 %   Neutro Abs 3.3 1.7 - 7.7 K/uL   Lymphocytes Relative 26 12 - 46 %   Lymphs Abs 1.3 0.7 - 4.0 K/uL   Monocytes Relative 8 3 - 12 %   Monocytes Absolute 0.4 0.1 - 1.0 K/uL   Eosinophils Relative 2 0 - 5 %   Eosinophils Absolute 0.1 0.0 - 0.7 K/uL   Basophils Relative 0 0 - 1 %   Basophils Absolute 0.0 0.0 - 0.1 K/uL  Brain natriuretic peptide  Result Value Ref Range   B Natriuretic Peptide 14.0 0.0 - 100.0 pg/mL  Troponin I  Result Value Ref Range   Troponin I <0.03 <0.031 ng/mL   Laboratory interpretation all normal      Imaging Review Dg Chest Port 1 View  06/12/2014   CLINICAL DATA:  Wheezing and shortness of breath. Allergic reaction.  EXAM: PORTABLE CHEST - 1 VIEW  COMPARISON:  09/14/2013  FINDINGS: The lungs are hyperinflated with underlying emphysema. There is progressive peribronchial thickening. Blunting of both costophrenic angles is unchanged. There is no consolidation, pneumothorax or pulmonary edema. Calcifications projecting over the right mid lung zone likely represent costochondral cartilage calcification. No acute osseous abnormality.  IMPRESSION: Mild bronchial thickening has progressed from prior exam. Underlying hyperinflation and emphysematous  change again seen. Blunting of both costophrenic angles, stable, may be related to hyperinflation or small pleural effusions.   Electronically Signed   By: Jeb Levering M.D.   On: 06/12/2014 05:19     EKG  Interpretation None      MDM   Final diagnoses:  Allergic reaction, initial encounter  Bronchospasm  Hypoxia   Plan admission   Rolland Porter, MD, [place] admission   Rolland Porter, MD, Barbette Or, MD 06/12/14 [phone]

## 2014-06-12 NOTE — H&P (Signed)
[facility] History and Physical  Bridget Ball WUG:[id number] DOB: 1928/02/28 DOA: 06/12/2014  Referring physician: Tomi Bamberger PCP: Delphina Cahill, MD   Chief Complaint:   HPI: Bridget Ball is a 81 y.o. female with medical hx including COPD not on oxygen, stroke, seizures, dementia presents emergency department with the chief complaint of shortness of breath and itching. She'll evaluation in the emergency department revealed acute respiratory failure with hypoxia.  Patient reports being in her usual state of health until 4 AM when she awakened "itching all over". Associated symptoms include worsening shortness of breath. She states she took her flow air but her breathing did not improve and the itching persisted. She denies any chest pain palpitations headache dizziness syncope or near-syncope. She denies any abdominal pain nausea vomiting. She denies any recent exposures to new medicines lotions cleaning products. Her daughter does report that recently her inhaler was changed 3 but was discontinued and Combivent started. She also endorses some chronic constipation and decreased appetite.  Workup in the emergency department includes a pregnancy metabolic panel is unremarkable complete blood count is unremarkable. Chest x-ray revealed mild bronchial thickening that is progressed and hyperinflation with emphysematous changes.  She is afebrile and hemodynamically stable. She ambulated to the bathroom in the emergency department on room air oxygen saturation level dropped 82%. He is placed on oxygen supplementation oxygen saturation level increased to greater than 90%.  In the emergency department she was given nebulizer Solu-Medrol Pepcid and Benadryl.   Review of Systems:  10 point review of systems complete and all systems are negative except as indicated in the history of present illness Past Medical History  Diagnosis Date  . COPD (chronic obstructive pulmonary disease)   . Arthritis   .  Depression   . Memory difficulties   . Chronic diarrhea   . Hiatal hernia   . Cancer     Skin cancer  . Hyperlipidemia   . Myocardial infarction     unknown time   . Shortness of breath     with exertion  . Seizures     x 2 last year due to bleeding to the brain.  . Stroke     TIA four years ago / stroke NOV 2014  . Bruises easily   . GERD (gastroesophageal reflux disease)   . Microscopic colitis   . Colitis   . Hydronephrosis, left   . Hx of septic shock JULY 2015  . Dementia   . Anxiety   . Hx of pulmonary edema JULY 2015  . Attention to nephrostomy     PT HAS NEPHROSTOMY TUBE IN PLACE  . Hx of bronchitis   . Hx of pulmonary edema JULY 2015   Past Surgical History  Procedure Laterality Date  . Appendectomy    . Skin cancer excision    . Tubal ligation    . Colonoscopy N/A 05/24/2013    Procedure: COLONOSCOPY;  Surgeon: Rogene Houston, MD;  Location: [facility];  Service: Endoscopy;  Laterality: N/A;  100  . Esophagogastroduodenoscopy N/A 05/24/2013    Procedure: ESOPHAGOGASTRODUODENOSCOPY (EGD);  Surgeon: Rogene Houston, MD;  Location: [facility];  Service: Endoscopy;  Laterality: N/A;  . Tonsillectomy    . Cystoscopy with retrograde pyelogram, ureteroscopy and stent placement Left 09/18/2013    Procedure: CYSTOSCOPY WITH RETROGRADE PYELOGRAM,  URETEROSCOPY AND  STENT PLACEMENT, removal of nephrostomy tube;  Surgeon: Jorja Loa, MD;  Location: [facility];  Service: Urology;  Laterality: Left;  .  Breast biopsy Right 02/02/2014    Procedure: RIGHT BREAST BIOPSY;  Surgeon: Jamesetta So, MD;  Location: [facility];  Service: General;  Laterality: Right;   Social History:  reports that she quit smoking about 16 years ago. She has never used smokeless tobacco. She reports that she drinks about 4.2 oz of alcohol per week. She reports that she does not use illicit drugs. Lives at home alone she is independent with ADLs she is not on home oxygen does not have nebulizers.  She drinks wine daily Allergies  Allergen Reactions  . Penicillins Anaphylaxis    Throat swelling as a child. Tolerated ceftriaxone 07/29/13.    Family History  Problem Relation Age of Onset  . Diabetes Mother      Prior to Admission medications   Medication Sig Start Date End Date Taking? Authorizing Provider  acetaminophen (TYLENOL) 500 MG tablet Take 1,000 mg by mouth every 6 (six) hours as needed for mild pain.   Yes Historical Provider, MD  albuterol (PROVENTIL HFA;VENTOLIN HFA) 108 (90 BASE) MCG/ACT inhaler Inhale 2 puffs into the lungs every 6 (six) hours as needed for wheezing or shortness of breath. 08/03/13  Yes Janece Canterbury, MD  budesonide-formoterol [facility]) 160-4.5 MCG/ACT inhaler Inhale 2 puffs into the lungs 2 (two) times daily.   Yes Historical Provider, MD  COMBIVENT RESPIMAT 20-100 MCG/ACT AERS respimat Inhale 1 puff into the lungs 2 (two) times daily. 03/02/14  Yes Historical Provider, MD  diphenhydrAMINE (BENADRYL) 25 MG tablet Take 50 mg by mouth once as needed for itching.   Yes Historical Provider, MD  SPIRIVA HANDIHALER 18 MCG inhalation capsule Place 1 capsule into inhaler and inhale daily. 05/20/14  Yes Historical Provider, MD  atorvastatin (LIPITOR) 80 MG tablet Take 1 tablet (80 mg total) by mouth daily at 6 PM. 08/03/13   Janece Canterbury, MD  citalopram (CELEXA) 10 MG tablet Take 10 mg by mouth every morning.     Historical Provider, MD  feeding supplement, ENSURE COMPLETE, (ENSURE COMPLETE) LIQD Take 237 mLs by mouth 3 (three) times daily between meals. 08/03/13   Janece Canterbury, MD  levETIRAcetam (KEPPRA) 250 MG tablet TAKE 1 TABLET BY MOUTH TWICE A DAY 02/22/14   Garvin Fila, MD  Methylfol-Methylcob-Acetylcyst (CEREFOLIN NAC PO) Take by mouth daily.    Historical Provider, MD  pantoprazole (PROTONIX) 40 MG tablet TAKE 1 TABLET BY MOUTH EVERY MORNING WITH BREAKFAST 01/15/14   Rogene Houston, MD  solifenacin (VESICARE) 5 MG tablet Take 5 mg by mouth daily.     Historical Provider, MD   Physical Exam: Filed Vitals:   06/12/14 6962 06/12/14 0723 06/12/14 0726 06/12/14 0730  BP: 121/76   100/73  Pulse:  57 109 108  Temp:      Resp:      Height:      Weight:      SpO2:  82% 95% 95%    Wt Readings from Last 3 Encounters:  06/12/14 47.174 kg (104 lb)  03/21/14 46.267 kg (102 lb)  03/20/14 45.36 kg (100 lb)    General:  Appears calm and comfortable, thin somewhat frail Eyes: PERRL, normal lids, irises & conjunctiva ENT: grossly normal hearing, lips & tongue, mucous membranes of her mouth are pink slightly dry Neck: no LAD, masses or thyromegaly Cardiovascular: RRR, no m/r/g. No LE edema. Respiratory: Normal effort rest sounds quite diminished particularly in the bases. Faint wheeze Abdomen: soft, ntnd Skin: no rash or induration seen on limited exam Musculoskeletal: grossly  normal tone BUE/BLE Psychiatric: grossly normal mood and affect, speech fluent and appropriate Neurologic: grossly non-focal.           Labs on Admission:  Basic Metabolic Panel:  Recent Labs Lab 06/12/14 0445  NA 140  K 3.7  CL 106  CO2 25  GLUCOSE 117*  BUN 16  CREATININE 0.95  CALCIUM 9.1   Liver Function Tests:  Recent Labs Lab 06/12/14 0445  AST 21  ALT 17  ALKPHOS 49  BILITOT 0.4  PROT 6.6  ALBUMIN 3.9   No results for input(s): LIPASE, AMYLASE in the last 168 hours. No results for input(s): AMMONIA in the last 168 hours. CBC:  Recent Labs Lab 06/12/14 0445  WBC 5.2  NEUTROABS 3.3  HGB 12.6  HCT 38.6  MCV 98.0  PLT 240   Cardiac Enzymes:  Recent Labs Lab 06/12/14 0445  TROPONINI <0.03    BNP (last 3 results)  Recent Labs  06/12/14 0445  BNP 14.0    ProBNP (last 3 results) No results for input(s): PROBNP in the last 8760 hours.  CBG: No results for input(s): GLUCAP in the last 168 hours.  Radiological Exams on Admission: Dg Chest Port 1 View  06/12/2014   CLINICAL DATA:  Wheezing and shortness of breath.  Allergic reaction.  EXAM: PORTABLE CHEST - 1 VIEW  COMPARISON:  09/14/2013  FINDINGS: The lungs are hyperinflated with underlying emphysema. There is progressive peribronchial thickening. Blunting of both costophrenic angles is unchanged. There is no consolidation, pneumothorax or pulmonary edema. Calcifications projecting over the right mid lung zone likely represent costochondral cartilage calcification. No acute osseous abnormality.  IMPRESSION: Mild bronchial thickening has progressed from prior exam. Underlying hyperinflation and emphysematous change again seen. Blunting of both costophrenic angles, stable, may be related to hyperinflation or small pleural effusions.   Electronically Signed   By: Jeb Levering M.D.   On: 06/12/2014 05:19    EKG:   Assessment/Plan Principal Problem:   Acute respiratory failure with hypoxia: May be related to allergic reaction in the setting of underlying COPD. Per chest x-ray. Initial troponin negative. Echo done a year ago yields EF of 55% and grade 1 diastolic dysfunction. ProBNP within the limits of normal. Will continue oxygen supplementation, Solu-Medrol, DuoNeb's every 4 hours. Will ambulate later today or early in the morning to assess for oxygen needs. Daughter reports inhalers recently changed but patient reports she has not made the switch yet. Active Problems: COPD (chronic obstructive pulmonary disease): Chest x-ray refers to progressing bronchial thickening. She does not wear oxygen at home and denies shortness of breath with ADLs. Does have inhalers but no nebulizers. See #1. May benefit pulmonary function tests as outpatient   Depression: Appears stable at baseline      Esophageal reflux: Stable at baseline continue home meds      Memory loss/dementia: Review indicates patient recently evaluated by neurology who note indicates some improvement on current medications.   Code Status: Full DVT Prophylaxis: Family Communication: Son at  bedside Disposition Plan: Home likely tomorrow  Time spent: 14 minutes  [facility] Pager [phone]

## 2014-06-13 DIAGNOSIS — J9601 Acute respiratory failure with hypoxia: Secondary | ICD-10-CM | POA: Diagnosis not present

## 2014-06-13 DIAGNOSIS — J441 Chronic obstructive pulmonary disease with (acute) exacerbation: Secondary | ICD-10-CM

## 2014-06-13 MED ORDER — SODIUM CHLORIDE 0.9 % IV BOLUS (SEPSIS)
250.0000 mL | Freq: Once | INTRAVENOUS | Status: AC
  Administered 2014-06-13: 250 mL via INTRAVENOUS

## 2014-06-13 MED ORDER — PREDNISONE 20 MG PO TABS: 40.0000 mg | ORAL_TABLET | Freq: Every day | ORAL | Status: DC

## 2014-06-13 MED ORDER — PREDNISONE 10 MG PO TABS: ORAL_TABLET | ORAL | Status: DC

## 2014-06-13 MED ORDER — SODIUM CHLORIDE 0.9 % IV BOLUS (SEPSIS)
250.0000 mL | Freq: Once | INTRAVENOUS | Status: AC
Start: 2014-06-13 — End: 2014-06-13
  Administered 2014-06-13: 250 mL via INTRAVENOUS

## 2014-06-13 MED ORDER — BUDESONIDE-FORMOTEROL FUMARATE 160-4.5 MCG/ACT IN AERO: 2.0000 | INHALATION_SPRAY | Freq: Two times a day (BID) | RESPIRATORY_TRACT | Status: DC

## 2014-06-13 NOTE — Progress Notes (Signed)
Pt A&O X4 at beginning of shift. Throughout night pt has become very disoriented. Nurse attempts to re-orient pt each time, with some success. Will continue to re-orient. Bed alarm remains on. Will continue to monitor pt frequently throughout night. Bed remains in lowest position and call bell is within reach.

## 2014-06-13 NOTE — Progress Notes (Signed)
BP 86/49 HR 91. Notified Dr Maudie Mercury, who ordered x1 Bolus--250 cc. Administering now. Will re-check BP and continue to monitor pt frequently throughout night. Bed remains in lowest position and call bell is within reach. Pt denies chest pain.

## 2014-06-13 NOTE — Care Management Note (Signed)
Case Management Note  Patient Details  Name: Bridget Ball MRN: [id number] Date of Birth: [birth year]  Subjective/Objective:                  Pt admitted from home with respiratory failure. Pt lives alone and will return home at discharge. Pt is independent with ADL's. Pt has a daughter who is very active in the care of the pt.  Action/Plan: Pt for discharge today. Pt does not qualify for home O2. No other CM needs noted.   Expected Discharge Date:                  Expected Discharge Plan:  Home/Self Care  In-House Referral:  NA  Discharge planning Services  CM Consult  Post Acute Care Choice:  NA Choice offered to:  NA  DME Arranged:    DME Agency:     HH Arranged:    HH Agency:     Status of Service:  Completed, signed off  Medicare Important Message Given:  N/A - LOS <3 / Initial given by admissions Date Medicare IM Given:    Medicare IM give by:    Date Additional Medicare IM Given:    Additional Medicare Important Message give by:     If discussed at [place] of Stay Meetings, dates discussed:    Additional Comments:  Joylene Draft, RN 06/13/2014, 1:38 PM

## 2014-06-13 NOTE — Progress Notes (Signed)
SATURATION QUALIFICATIONS: (This note is used to comply with regulatory documentation for home oxygen)  Patient Saturations on Room Air at Rest = 97%  Patient Saturations on Room Air while Ambulating = 94%  Patient Saturations on 2 Liters of oxygen while Ambulating = 98%  Please briefly explain why patient needs home oxygen: 

## 2014-06-13 NOTE — Discharge Summary (Signed)
Physician Discharge Summary  Bridget Ball [id number]:[id number] DOB: 1928-01-26 DOA: 06/12/2014  PCP: Delphina Cahill, MD  Admit date: 06/12/2014 Discharge date: 06/13/2014  Time spent: 40 minutes  Recommendations for Outpatient Follow-up:  1. Follow up with Dr Delphina Cahill 06/20/14 to evaluate respiratory status. Consider PFT  Discharge Diagnoses:  Principal Problem:   Acute respiratory failure with hypoxia Active Problems:   Memory loss   Depression   COPD (chronic obstructive pulmonary disease)   Esophageal reflux   Bronchospasm   Allergic reaction   Discharge Condition: stable  Diet recommendation: heart healthy  Filed Weights   06/12/14 0424 06/12/14 0920  Weight: 47.174 kg (104 lb) 47.174 kg (104 lb)    History of present illness:  Bridget Ball is a 81 y.o. female with medical hx including COPD not on oxygen, stroke, seizures, dementia presented to  emergency department on 06/12/14 with the chief complaint of shortness of breath and itching. Initial evaluation in the emergency department revealed acute respiratory failure with hypoxia.  Patient reported being in her usual state of health until 4 AM when she awakened "itching all over". Associated symptoms included worsening shortness of breath. She stated she took her flow air but her breathing did not improve and the itching persisted. She denied any chest pain palpitations headache dizziness syncope or near-syncope. She denied any abdominal pain nausea vomiting. She denied any recent exposures to new medicines lotions cleaning products. Her daughter did report that recently her inhaler spiriva discontinued and Combivent started. She also endorsed some chronic constipation and decreased appetite.  Workup in the emergency department included a metabolic panel  unremarkable complete blood count was unremarkable. Chest x-ray revealed mild bronchial thickening that was progressed and hyperinflation with emphysematous changes.  She was afebrile  and hemodynamically stable. She ambulated to the bathroom in the emergency department on room air oxygen saturation level dropped 82%. sHe was placed on oxygen supplementation oxygen saturation level increased to greater than 90%.  In the emergency department she was given nebulizer Solu-Medrol Pepcid and Benadryl  Hospital Course:  Acute respiratory failure with hypoxia: likely related to  COPD exacerbation. Resolved at discharge. Per chest x-ray. troponin negative. Echo done a year ago yields EF of 55% and grade 1 diastolic dysfunction. ProBNP within the limits of normal. Provided Solu-Medrol, DuoNeb's with quick improvement. At discharge ambulated in hall on room air and sats remianed >90%. Recommend OP PFT. Active Problems: COPD (chronic obstructive pulmonary disease): with exacerbation.  Chest x-ray refers to progressing bronchial thickening. She does not wear oxygen at home and denies shortness of breath with ADLs. Family/friends confirm DOE.  Will discharge with Spiriva and prn albuterol.she will resume symbicort and stop combivent. She will be discharged with prednisone taper as well.  She would likely benefit OP PFT.  See #1.   Depression/anxiety: remaind stable but easily agitated. Easily redirected.    Esophageal reflux: Stable at baseline    Memory loss/dementia: Review indicates patient recently evaluated by neurology who note indicates some improvement on current medications. Family/friends confirm short term memory defecit  Procedures:  none  Consultations:  none  Discharge Exam: Filed Vitals:   06/13/14 1238  BP: 127/78  Pulse:   Temp:   Resp:     General: thin but energetic slightly anxious, appears well Cardiovascular: RRR no MGR no LE edema Respiratory: mild increased work of breathing with conversation if anxious. Good air movement slightly prolonged expiratory phase.   Discharge Instructions    Current Discharge Medication  List    START taking  these medications   Details  predniSONE (DELTASONE) 10 MG tablet Take 40 mg by mouth daily for 3 days, then take 20 mg by mouth daily for 3 days, then take 10 mg by mouth daily for 3 days, then stop. Qty: 21 tablet, Refills: 0      CONTINUE these medications which have CHANGED   Details  budesonide-formoterol (SYMBICORT) 160-4.5 MCG/ACT inhaler Inhale 2 puffs into the lungs 2 (two) times daily. Qty: 1 Inhaler, Refills: 0      CONTINUE these medications which have NOT CHANGED   Details  acetaminophen (TYLENOL) 500 MG tablet Take 1,000 mg by mouth every 6 (six) hours as needed for mild pain.    albuterol (PROVENTIL HFA;VENTOLIN HFA) 108 (90 BASE) MCG/ACT inhaler Inhale 2 puffs into the lungs every 6 (six) hours as needed for wheezing or shortness of breath. Qty: 1 Inhaler, Refills: 0    atorvastatin (LIPITOR) 80 MG tablet Take 1 tablet (80 mg total) by mouth daily at 6 PM. Qty: 30 tablet, Refills: 0    citalopram (CELEXA) 10 MG tablet Take 10 mg by mouth every morning.     diphenhydrAMINE (BENADRYL) 25 MG tablet Take 50 mg by mouth once as needed for itching.    feeding supplement, ENSURE COMPLETE, (ENSURE COMPLETE) LIQD Take 237 mLs by mouth 3 (three) times daily between meals. Qty: 90 Bottle, Refills: 0    levETIRAcetam (KEPPRA) 250 MG tablet TAKE 1 TABLET BY MOUTH TWICE A DAY Qty: 60 tablet, Refills: 0    pantoprazole (PROTONIX) 40 MG tablet TAKE 1 TABLET BY MOUTH EVERY MORNING WITH BREAKFAST Qty: 30 tablet, Refills: 5    solifenacin (VESICARE) 5 MG tablet Take 5 mg by mouth daily.    SPIRIVA HANDIHALER 18 MCG inhalation capsule Place 1 capsule into inhaler and inhale daily. Refills: 1    Methylfol-Methylcob-Acetylcyst (CEREFOLIN NAC PO) Take by mouth daily.      STOP taking these medications     COMBIVENT RESPIMAT 20-100 MCG/ACT AERS respimat        Allergies  Allergen Reactions  . Penicillins Anaphylaxis    Throat swelling as a child. Tolerated ceftriaxone  07/29/13.   Follow-up Information    Follow up with Delphina Cahill, MD On 06/20/2014.   Specialty:  Internal Medicine   Why:  at 10:10 am   Contact information:    [place] [address] [phone]        The results of significant diagnostics from this hospitalization (including imaging, microbiology, ancillary and laboratory) are listed below for reference.    Significant Diagnostic Studies: US Renal  05/17/2014   CLINICAL DATA:  Hydronephrosis, unspecified. Left renal stent. Followup.  EXAM: RENAL / URINARY TRACT ULTRASOUND COMPLETE  COMPARISON:  01/16/2014  FINDINGS: Right Kidney:  Length: 9.8 cm. Echogenicity within normal limits. No mass or hydronephrosis visualized.  Left Kidney:  Length: 8.4 cm. Echogenicity within normal limits. No mass or hydronephrosis visualized.  Bladder:  Appears normal for degree of bladder distention. Ureteral stent is partially coiled within the bladder.  IMPRESSION: 1. No evidence for hydronephrosis. 2. Left ureteral stent identified within the bladder.   Electronically Signed   By: Nolon Nations M.D.   On: 05/17/2014 10:05   Dg Chest Port 1 View  06/12/2014   CLINICAL DATA:  Wheezing and shortness of breath. Allergic reaction.  EXAM: PORTABLE CHEST - 1 VIEW  COMPARISON:  09/14/2013  FINDINGS: The lungs are hyperinflated  with underlying emphysema. There is progressive peribronchial thickening. Blunting of both costophrenic angles is unchanged. There is no consolidation, pneumothorax or pulmonary edema. Calcifications projecting over the right mid lung zone likely represent costochondral cartilage calcification. No acute osseous abnormality.  IMPRESSION: Mild bronchial thickening has progressed from prior exam. Underlying hyperinflation and emphysematous change again seen. Blunting of both costophrenic angles, stable, may be related to hyperinflation or small pleural effusions.   Electronically Signed   By: Jeb Levering M.D.   On: 06/12/2014  05:19    Microbiology: No results found for this or any previous visit (from the past 240 hour(s)).   Labs: Basic Metabolic Panel:  Recent Labs Lab 06/12/14 0445  NA 140  K 3.7  CL 106  CO2 25  GLUCOSE 117*  BUN 16  CREATININE 0.95  CALCIUM 9.1   Liver Function Tests:  Recent Labs Lab 06/12/14 0445  AST 21  ALT 17  ALKPHOS 49  BILITOT 0.4  PROT 6.6  ALBUMIN 3.9   No results for input(s): LIPASE, AMYLASE in the last 168 hours. No results for input(s): AMMONIA in the last 168 hours. CBC:  Recent Labs Lab 06/12/14 0445  WBC 5.2  NEUTROABS 3.3  HGB 12.6  HCT 38.6  MCV 98.0  PLT 240   Cardiac Enzymes:  Recent Labs Lab 06/12/14 0445  TROPONINI <0.03   BNP: BNP (last 3 results)  Recent Labs  06/12/14 0445  BNP 14.0    ProBNP (last 3 results) No results for input(s): PROBNP in the last 8760 hours.  CBG: No results for input(s): GLUCAP in the last 168 hours.     SignedRadene Gunning  [facility] 06/13/2014, 2:00 PM

## 2014-06-13 NOTE — Progress Notes (Signed)
1420 Patient d/c home, d/d instructions and hard Rxs given to patient and patient's daughter. Patient aware to f/u with PCP as scheduled on d/c paperwork. IV catheter removed from LEFT forearm, catheter tip intact, no s/s of infection noted, patient tolerated well w/no c/o pain or discomfort noted. Patient assisted to vehicle by staff via w/c, daughter transported home.

## 2014-07-04 ENCOUNTER — Other Ambulatory Visit: Payer: Self-pay | Admitting: Neurology

## 2014-07-17 DIAGNOSIS — L98491 Non-pressure chronic ulcer of skin of other sites limited to breakdown of skin: Secondary | ICD-10-CM | POA: Diagnosis not present

## 2014-07-17 DIAGNOSIS — J449 Chronic obstructive pulmonary disease, unspecified: Secondary | ICD-10-CM | POA: Diagnosis not present

## 2014-07-17 DIAGNOSIS — G9611 Dural tear: Secondary | ICD-10-CM | POA: Diagnosis not present

## 2014-07-28 DIAGNOSIS — C4491 Basal cell carcinoma of skin, unspecified: Secondary | ICD-10-CM | POA: Diagnosis not present

## 2014-08-03 DIAGNOSIS — I739 Peripheral vascular disease, unspecified: Secondary | ICD-10-CM | POA: Diagnosis not present

## 2014-08-03 DIAGNOSIS — B351 Tinea unguium: Secondary | ICD-10-CM | POA: Diagnosis not present

## 2014-08-03 DIAGNOSIS — L851 Acquired keratosis [keratoderma] palmaris et plantaris: Secondary | ICD-10-CM | POA: Diagnosis not present

## 2014-08-06 DIAGNOSIS — L821 Other seborrheic keratosis: Secondary | ICD-10-CM | POA: Diagnosis not present

## 2014-08-06 DIAGNOSIS — D225 Melanocytic nevi of trunk: Secondary | ICD-10-CM | POA: Diagnosis not present

## 2014-08-06 DIAGNOSIS — L814 Other melanin hyperpigmentation: Secondary | ICD-10-CM | POA: Diagnosis not present

## 2014-08-06 DIAGNOSIS — D1801 Hemangioma of skin and subcutaneous tissue: Secondary | ICD-10-CM | POA: Diagnosis not present

## 2014-08-06 DIAGNOSIS — C44320 Squamous cell carcinoma of skin of unspecified parts of face: Secondary | ICD-10-CM | POA: Diagnosis not present

## 2014-08-06 DIAGNOSIS — L570 Actinic keratosis: Secondary | ICD-10-CM | POA: Diagnosis not present

## 2014-08-06 DIAGNOSIS — D485 Neoplasm of uncertain behavior of skin: Secondary | ICD-10-CM | POA: Diagnosis not present

## 2014-08-06 DIAGNOSIS — C44529 Squamous cell carcinoma of skin of other part of trunk: Secondary | ICD-10-CM | POA: Diagnosis not present

## 2014-08-07 DIAGNOSIS — D0439 Carcinoma in situ of skin of other parts of face: Secondary | ICD-10-CM | POA: Diagnosis not present

## 2014-08-07 DIAGNOSIS — L570 Actinic keratosis: Secondary | ICD-10-CM | POA: Diagnosis not present

## 2014-08-07 DIAGNOSIS — C44622 Squamous cell carcinoma of skin of right upper limb, including shoulder: Secondary | ICD-10-CM | POA: Diagnosis not present

## 2014-08-07 DIAGNOSIS — D0461 Carcinoma in situ of skin of right upper limb, including shoulder: Secondary | ICD-10-CM | POA: Diagnosis not present

## 2014-08-13 ENCOUNTER — Encounter (INDEPENDENT_AMBULATORY_CARE_PROVIDER_SITE_OTHER): Payer: Self-pay | Admitting: *Deleted

## 2014-08-14 DIAGNOSIS — D0439 Carcinoma in situ of skin of other parts of face: Secondary | ICD-10-CM | POA: Diagnosis not present

## 2014-08-14 DIAGNOSIS — C44622 Squamous cell carcinoma of skin of right upper limb, including shoulder: Secondary | ICD-10-CM | POA: Diagnosis not present

## 2014-08-14 DIAGNOSIS — L570 Actinic keratosis: Secondary | ICD-10-CM | POA: Diagnosis not present

## 2014-08-14 DIAGNOSIS — D0461 Carcinoma in situ of skin of right upper limb, including shoulder: Secondary | ICD-10-CM | POA: Diagnosis not present

## 2014-08-14 DIAGNOSIS — D040 Carcinoma in situ of skin of lip: Secondary | ICD-10-CM | POA: Diagnosis not present

## 2014-08-21 ENCOUNTER — Ambulatory Visit (INDEPENDENT_AMBULATORY_CARE_PROVIDER_SITE_OTHER): Payer: Medicare Other | Admitting: Urology

## 2014-08-21 DIAGNOSIS — R3915 Urgency of urination: Secondary | ICD-10-CM | POA: Diagnosis not present

## 2014-08-21 DIAGNOSIS — N1330 Unspecified hydronephrosis: Secondary | ICD-10-CM | POA: Diagnosis not present

## 2014-08-23 ENCOUNTER — Other Ambulatory Visit: Payer: Self-pay | Admitting: Urology

## 2014-08-27 ENCOUNTER — Other Ambulatory Visit: Payer: Self-pay | Admitting: Neurology

## 2014-08-28 ENCOUNTER — Ambulatory Visit (INDEPENDENT_AMBULATORY_CARE_PROVIDER_SITE_OTHER): Payer: Medicare Other | Admitting: Internal Medicine

## 2014-08-28 DIAGNOSIS — D0439 Carcinoma in situ of skin of other parts of face: Secondary | ICD-10-CM | POA: Diagnosis not present

## 2014-08-28 DIAGNOSIS — L570 Actinic keratosis: Secondary | ICD-10-CM | POA: Diagnosis not present

## 2014-08-30 NOTE — Patient Instructions (Signed)
MEHREEN AZIZI  08/30/2014     @PREFPERIOPPHARMACY @   Your procedure is scheduled on  09/04/2014  Report to [facility] at  40  A.M.  Call this number if you have problems the morning of surgery:  [phone]   Remember:  Do not eat food or drink liquids after midnight.  Take these medicines the morning of surgery with A SIP OF WATER celexa, keppra, protonix, deltasone, vesicare.   Do not wear jewelry, make-up or nail polish.  Do not wear lotions, powders, or perfumes.  You may wear deodorant.  Do not shave 48 hours prior to surgery.  Men may shave face and neck.  Do not bring valuables to the hospital.  [facility] is not responsible for any belongings or valuables.  Contacts, dentures or bridgework may not be worn into surgery.  Leave your suitcase in the car.  After surgery it may be brought to your room.  For patients admitted to the hospital, discharge time will be determined by your treatment team.  Patients discharged the day of surgery will not be allowed to drive home.   Name and phone number of your driver:   family Special instructions:  none  Please read over the following fact sheets that you were given. Pain Booklet, Coughing and Deep Breathing, Surgical Site Infection Prevention, Anesthesia Post-op Instructions and Care and Recovery After Surgery      Ureteral Stent Implantation Ureteral stent implantation is the implantation of a soft plastic tube with multiple holes into the tube that drains urine from your kidney to your bladder (ureter). The stent helps drain your kidney when there is a blockage of the flow of urine in your ureter. The stent has a coil on each end to keep it from falling out. One end stays in the kidney. The other end stays in the bladder. It is most often taken out after any blockage has been removed or your ureter has healed. Short-term stents have a string attached to make removal quite easy. Removal of a short-term stent can  be done in your health care provider's office or by you at home. Long-term stents need to be changed every few months. LET [facility] CARE PROVIDER KNOW ABOUT:  Any allergies you have.  All medicines you are taking, including vitamins, herbs, eye drops, creams, and over-the-counter medicines.  Previous problems you or members of your family have had with the use of anesthetics.  Any blood disorders you have.  Previous surgeries you have had.  Medical conditions you have. RISKS AND COMPLICATIONS Generally, ureteral stent implantation is a safe procedure. However, as with any procedure, complications can occur. Possible complications include:  Movement of the stent away from where it was originally placed (migration). This may affect the ability of the stent to properly drain your kidney. If migration of the stent occurs, the stent may need to be replaced or repositioned.  Perforation of the ureter.  Infection. BEFORE THE PROCEDURE  You may be asked to wash your genital area with sterile soap the morning of your procedure.  You may be given an oral antibiotic which you should take with a sip of water as prescribed by your health care provider.  You may be asked to not eat or drink for 8 hours before the surgery. PROCEDURE  First you will be given an anesthetic so you do not feel pain during the procedure.  Your health care provider will insert a  special lighted instrument called a cystoscope into your bladder. This allows your health care provider to see the opening to your ureter.  A thin wire is carefully threaded into your bladder and up the ureter. The stent is inserted over the wire and the wire is then removed.  Your bladder will be emptied of urine. AFTER THE PROCEDURE You will be taken to a recovery room until it is okay for you to go home. Document Released: 12/20/1999 Document Revised: 12/27/2012 Document Reviewed: 05/31/2012 [facility] Patient Information 2015  [place], [place]. This information is not intended to replace advice given to you by your health care provider. Make sure you discuss any questions you have with your health care provider. Cystoscopy Cystoscopy is a procedure that is used to help your caregiver diagnose and sometimes treat conditions that affect your lower urinary tract. Your lower urinary tract includes your bladder and the tube through which urine passes from your bladder out of your body (urethra). Cystoscopy is performed with a thin, tube-shaped instrument (cystoscope). The cystoscope has lenses and a light at the end so that your caregiver can see inside your bladder. The cystoscope is inserted at the entrance of your urethra. Your caregiver guides it through your urethra and into your bladder. There are two main types of cystoscopy:  Flexible cystoscopy (with a flexible cystoscope).  Rigid cystoscopy (with a rigid cystoscope). Cystoscopy may be recommended for many conditions, including:  Urinary tract infections.  Blood in your urine (hematuria).  Loss of bladder control (urinary incontinence) or overactive bladder.  Unusual cells found in a urine sample.  Urinary blockage.  Painful urination. Cystoscopy may also be done to remove a sample of your tissue to be checked under a microscope (biopsy). It may also be done to remove or destroy bladder stones. LET YOUR CAREGIVER KNOW ABOUT:  Allergies to food or medicine.  Medicines taken, including vitamins, herbs, eyedrops, over-the-counter medicines, and creams.  Use of steroids (by mouth or creams).  Previous problems with anesthetics or numbing medicines.  History of bleeding problems or blood clots.  Previous surgery.  Other health problems, including diabetes and kidney problems.  Possibility of pregnancy, if this applies. PROCEDURE The area around the opening to your urethra will be cleaned. A medicine to numb your urethra (local anesthetic) is used. If a  tissue sample or stone is removed during the procedure, you may be given a medicine to make you sleep (general anesthetic). Your caregiver will gently insert the tip of the cystoscope into your urethra. The cystoscope will be slowly glided through your urethra and into your bladder. Sterile fluid will flow through the cystoscope and into your bladder. The fluid will expand and stretch your bladder. This gives your caregiver a better view of your bladder walls. The procedure lasts about 15-20 minutes. AFTER THE PROCEDURE If a local anesthetic is used, you will be allowed to go home as soon as you are ready. If a general anesthetic is used, you will be taken to a recovery area until you are stable. You may have temporary bleeding and burning on urination. Document Released: 12/20/1999 Document Revised: 09/16/2011 Document Reviewed: 06/15/2011 [facility] Patient Information 2015 [place], [place]. This information is not intended to replace advice given to you by your health care provider. Make sure you discuss any questions you have with your health care provider. PATIENT INSTRUCTIONS POST-ANESTHESIA  IMMEDIATELY FOLLOWING SURGERY:  Do not drive or operate machinery for the first twenty four hours after surgery.  Do not  make any important decisions for twenty four hours after surgery or while taking narcotic pain medications or sedatives.  If you develop intractable nausea and vomiting or a severe headache please notify your doctor immediately.  FOLLOW-UP:  Please make an appointment with your surgeon as instructed. You do not need to follow up with anesthesia unless specifically instructed to do so.  WOUND CARE INSTRUCTIONS (if applicable):  Keep a dry clean dressing on the anesthesia/puncture wound site if there is drainage.  Once the wound has quit draining you may leave it open to air.  Generally you should leave the bandage intact for twenty four hours unless there is drainage.  If the epidural site  drains for more than 36-48 hours please call the anesthesia department.  QUESTIONS?:  Please feel free to call your physician or the hospital operator if you have any questions, and they will be happy to assist you.

## 2014-08-31 ENCOUNTER — Other Ambulatory Visit: Payer: Self-pay

## 2014-08-31 ENCOUNTER — Encounter (HOSPITAL_COMMUNITY)
Admission: RE | Admit: 2014-08-31 | Discharge: 2014-08-31 | Disposition: A | Discharge status: Home / Self Care | Payer: Medicare Other | Source: Ambulatory Visit | Attending: Urology | Admitting: Urology

## 2014-08-31 ENCOUNTER — Encounter (HOSPITAL_COMMUNITY): Payer: Self-pay

## 2014-08-31 DIAGNOSIS — Z01818 Encounter for other preprocedural examination: Secondary | ICD-10-CM | POA: Diagnosis not present

## 2014-08-31 DIAGNOSIS — N1330 Unspecified hydronephrosis: Secondary | ICD-10-CM | POA: Diagnosis not present

## 2014-08-31 LAB — CBC WITH DIFFERENTIAL/PLATELET
Basophils Absolute: 0 10*3/uL (ref 0.0–0.1)
Basophils Relative: 0 % (ref 0–1)
Eosinophils Absolute: 0.3 10*3/uL (ref 0.0–0.7)
Eosinophils Relative: 6 % — ABNORMAL HIGH (ref 0–5)
HCT: 37.1 % (ref 36.0–46.0)
HEMOGLOBIN: 12.3 g/dL (ref 12.0–15.0)
LYMPHS ABS: 1.1 10*3/uL (ref 0.7–4.0)
LYMPHS PCT: 19 % (ref 12–46)
MCH: 32.6 pg (ref 26.0–34.0)
MCHC: 33.2 g/dL (ref 30.0–36.0)
MCV: 98.4 fL (ref 78.0–100.0)
Monocytes Absolute: 0.5 10*3/uL (ref 0.1–1.0)
Monocytes Relative: 9 % (ref 3–12)
NEUTROS ABS: 4 10*3/uL (ref 1.7–7.7)
NEUTROS PCT: 66 % (ref 43–77)
Platelets: 208 10*3/uL (ref 150–400)
RBC: 3.77 MIL/uL — AB (ref 3.87–5.11)
RDW: 14.7 % (ref 11.5–15.5)
WBC: 6 10*3/uL (ref 4.0–10.5)

## 2014-08-31 LAB — BASIC METABOLIC PANEL
Anion gap: 6 (ref 5–15)
BUN: 13 mg/dL (ref 6–20)
CHLORIDE: 103 mmol/L (ref 101–111)
CO2: 28 mmol/L (ref 22–32)
Calcium: 9.3 mg/dL (ref 8.9–10.3)
Creatinine, Ser: 0.89 mg/dL (ref 0.44–1.00)
GFR calc Af Amer: >60 mL/min (ref >60)
GFR calc non Af Amer: 58 mL/min — ABNORMAL LOW (ref >60)
Glucose, Bld: 95 mg/dL (ref 65–99)
POTASSIUM: 4.6 mmol/L (ref 3.5–5.1)
SODIUM: 137 mmol/L (ref 135–145)

## 2014-09-04 ENCOUNTER — Ambulatory Visit (HOSPITAL_COMMUNITY): Payer: Medicare Other | Admitting: Anesthesiology

## 2014-09-04 ENCOUNTER — Encounter (HOSPITAL_COMMUNITY)
Admission: RE | Disposition: A | Discharge status: Home / Self Care | Payer: Self-pay | Source: Ambulatory Visit | Attending: Urology

## 2014-09-04 ENCOUNTER — Ambulatory Visit (HOSPITAL_COMMUNITY)
Admission: RE | Admit: 2014-09-04 | Discharge: 2014-09-04 | Disposition: A | Discharge status: Home / Self Care | Payer: Medicare Other | Source: Ambulatory Visit | Attending: Urology | Admitting: Urology

## 2014-09-04 ENCOUNTER — Ambulatory Visit (HOSPITAL_COMMUNITY): Payer: Medicare Other

## 2014-09-04 ENCOUNTER — Encounter (HOSPITAL_COMMUNITY): Payer: Self-pay | Admitting: *Deleted

## 2014-09-04 DIAGNOSIS — M1990 Unspecified osteoarthritis, unspecified site: Secondary | ICD-10-CM | POA: Diagnosis not present

## 2014-09-04 DIAGNOSIS — F418 Other specified anxiety disorders: Secondary | ICD-10-CM | POA: Insufficient documentation

## 2014-09-04 DIAGNOSIS — Q6239 Other obstructive defects of renal pelvis and ureter: Secondary | ICD-10-CM | POA: Diagnosis not present

## 2014-09-04 DIAGNOSIS — N1330 Unspecified hydronephrosis: Secondary | ICD-10-CM | POA: Diagnosis not present

## 2014-09-04 DIAGNOSIS — J449 Chronic obstructive pulmonary disease, unspecified: Secondary | ICD-10-CM | POA: Diagnosis not present

## 2014-09-04 DIAGNOSIS — K219 Gastro-esophageal reflux disease without esophagitis: Secondary | ICD-10-CM | POA: Diagnosis not present

## 2014-09-04 DIAGNOSIS — I10 Essential (primary) hypertension: Secondary | ICD-10-CM | POA: Insufficient documentation

## 2014-09-04 DIAGNOSIS — Z87891 Personal history of nicotine dependence: Secondary | ICD-10-CM | POA: Diagnosis not present

## 2014-09-04 DIAGNOSIS — E785 Hyperlipidemia, unspecified: Secondary | ICD-10-CM | POA: Diagnosis not present

## 2014-09-04 HISTORY — PX: CYSTOSCOPY WITH STENT PLACEMENT: SHX5790

## 2014-09-04 SURGERY — CYSTOSCOPY, WITH STENT INSERTION
Anesthesia: General | Site: Ureter | Laterality: Left

## 2014-09-04 MED ORDER — SULFAMETHOXAZOLE-TRIMETHOPRIM 800-160 MG PO TABS: 1.0000 | ORAL_TABLET | Freq: Two times a day (BID) | ORAL | Status: DC

## 2014-09-04 MED ORDER — FENTANYL CITRATE (PF) 100 MCG/2ML IJ SOLN
INTRAMUSCULAR | Status: DC | PRN
  Administered 2014-09-04 (×2): 12.5 ug via INTRAVENOUS

## 2014-09-04 MED ORDER — LACTATED RINGERS IV SOLN
INTRAVENOUS | Status: DC
  Administered 2014-09-04: 12:00:00 via INTRAVENOUS

## 2014-09-04 MED ORDER — FENTANYL CITRATE (PF) 100 MCG/2ML IJ SOLN
INTRAMUSCULAR | Status: AC
  Filled 2014-09-04: qty 4

## 2014-09-04 MED ORDER — PROPOFOL 10 MG/ML IV BOLUS
INTRAVENOUS | Status: AC
  Filled 2014-09-04: qty 20

## 2014-09-04 MED ORDER — ONDANSETRON HCL 4 MG/2ML IJ SOLN: 4.0000 mg | Freq: Once | INTRAMUSCULAR | Status: DC | PRN

## 2014-09-04 MED ORDER — MIDAZOLAM HCL 2 MG/2ML IJ SOLN
1.0000 mg | INTRAMUSCULAR | Status: DC | PRN
Start: 2014-09-04 — End: 2014-09-04
  Administered 2014-09-04: 2 mg via INTRAVENOUS

## 2014-09-04 MED ORDER — ONDANSETRON HCL 4 MG/2ML IJ SOLN
4.0000 mg | Freq: Once | INTRAMUSCULAR | Status: AC
  Administered 2014-09-04: 4 mg via INTRAVENOUS

## 2014-09-04 MED ORDER — LIDOCAINE HCL (CARDIAC) 20 MG/ML IV SOLN
INTRAVENOUS | Status: DC | PRN
  Administered 2014-09-04 (×2): 25 mg via INTRAVENOUS

## 2014-09-04 MED ORDER — PROPOFOL INFUSION 10 MG/ML OPTIME
INTRAVENOUS | Status: DC | PRN
  Administered 2014-09-04: 50 ug/kg/min via INTRAVENOUS
  Administered 2014-09-04: 65 ug/kg/min via INTRAVENOUS

## 2014-09-04 MED ORDER — STERILE WATER FOR IRRIGATION IR SOLN
Status: DC | PRN
  Administered 2014-09-04: 3000 mL

## 2014-09-04 MED ORDER — FENTANYL CITRATE (PF) 100 MCG/2ML IJ SOLN: 25.0000 ug | INTRAMUSCULAR | Status: DC | PRN

## 2014-09-04 MED ORDER — MIDAZOLAM HCL 2 MG/2ML IJ SOLN
INTRAMUSCULAR | Status: AC
  Filled 2014-09-04: qty 2

## 2014-09-04 MED ORDER — EPHEDRINE SULFATE 50 MG/ML IJ SOLN
INTRAMUSCULAR | Status: AC
  Filled 2014-09-04: qty 1

## 2014-09-04 MED ORDER — ONDANSETRON HCL 4 MG/2ML IJ SOLN
INTRAMUSCULAR | Status: AC
  Filled 2014-09-04: qty 2

## 2014-09-04 MED ORDER — LIDOCAINE HCL (PF) 1 % IJ SOLN
INTRAMUSCULAR | Status: AC
  Filled 2014-09-04: qty 5

## 2014-09-04 MED ORDER — PHENYLEPHRINE 40 MCG/ML (10ML) SYRINGE FOR IV PUSH (FOR BLOOD PRESSURE SUPPORT)
PREFILLED_SYRINGE | INTRAVENOUS | Status: AC
Start: 2014-09-04 — End: 2014-09-04
  Filled 2014-09-04: qty 10

## 2014-09-04 MED ORDER — SODIUM CHLORIDE 0.9 % IJ SOLN
INTRAMUSCULAR | Status: AC
  Filled 2014-09-04: qty 10

## 2014-09-04 MED ORDER — GENTAMICIN SULFATE 40 MG/ML IJ SOLN
5.0000 mg/kg | INTRAVENOUS | Status: AC
  Administered 2014-09-04: 236 mg via INTRAVENOUS
  Filled 2014-09-04: qty 6

## 2014-09-04 MED ORDER — IOHEXOL 350 MG/ML SOLN
INTRAVENOUS | Status: DC | PRN
  Administered 2014-09-04: 5 mL via URETHRAL

## 2014-09-04 SURGICAL SUPPLY — 20 items
BAG DRAIN URO TABLE W/ADPT NS (DRAPE) ×3 IMPLANT
BAG DRN 8 ADPR NS SKTRN CSTL (DRAPE) ×1
BAG HAMPER (MISCELLANEOUS) ×3 IMPLANT
CATH INTERMIT  6FR 70CM (CATHETERS) ×3 IMPLANT
CATH OPEN TIP 6FR (CATHETERS) ×3 IMPLANT
CATH URET 5FR 28IN CONE TIP (BALLOONS)
CATH URET 5FR 28IN OPEN ENDED (CATHETERS) IMPLANT
CATH URET 5FR 70CM CONE TIP (BALLOONS) IMPLANT
CLOTH BEACON ORANGE TIMEOUT ST (SAFETY) ×3 IMPLANT
DRAPE CAMERA CLOSED 9X96 (DRAPES) ×3 IMPLANT
GLOVE BIOGEL M 8.0 STRL (GLOVE) ×3 IMPLANT
GOWN STRL REUS W/ TWL LRG LVL3 (GOWN DISPOSABLE) ×1 IMPLANT
GOWN STRL REUS W/TWL LRG LVL3 (GOWN DISPOSABLE) ×3
GOWN STRL REUS W/TWL XL LVL3 (GOWN DISPOSABLE) ×3 IMPLANT
GUIDEWIRE ANG ZIPWIRE 038X150 (WIRE) IMPLANT
GUIDEWIRE STR DUAL SENSOR (WIRE) ×2 IMPLANT
KIT ROOM TURNOVER AP CYSTO (KITS) ×3 IMPLANT
NS IRRIG 500ML POUR BTL (IV SOLUTION) IMPLANT
PACK CYSTO (CUSTOM PROCEDURE TRAY) ×3 IMPLANT
TOWEL OR 17X26 4PK STRL BLUE (TOWEL DISPOSABLE) ×3 IMPLANT

## 2014-09-04 NOTE — Anesthesia Preprocedure Evaluation (Signed)
Anesthesia Evaluation  Patient identified by MRN, date of birth, ID band Patient awake    Reviewed: Allergy & Precautions, H&P , NPO status , Patient's Chart, lab work & pertinent test results  History of Anesthesia Complications Negative for: history of anesthetic complications  Airway Mallampati: II  TM Distance: >3 FB Neck ROM: Full    Dental no notable dental hx. (+) Edentulous Upper, Edentulous Lower   Pulmonary shortness of breath, COPD COPD inhaler, former smoker,  breath sounds clear to auscultation  Pulmonary exam normal       Cardiovascular hypertension, + Past MI negative cardio ROS  Rhythm:Regular Rate:Normal     Neuro/Psych Seizures -, Well Controlled,  PSYCHIATRIC DISORDERS Anxiety Depression  Neuromuscular disease CVA    GI/Hepatic Neg liver ROS, hiatal hernia, GERD-  Medicated and Controlled,  Endo/Other  negative endocrine ROS  Renal/GU Renal disease  negative genitourinary   Musculoskeletal  (+) Arthritis -,   Abdominal   Peds negative pediatric ROS (+)  Hematology negative hematology ROS (+)   Anesthesia Other Findings   Reproductive/Obstetrics negative OB ROS                             Anesthesia Physical Anesthesia Plan  ASA: III  Anesthesia Plan: General   Post-op Pain Management:    Induction: Intravenous  Airway Management Planned: LMA  Additional Equipment:   Intra-op Plan:   Post-operative Plan: Extubation in OR  Informed Consent: I have reviewed the patients History and Physical, chart, labs and discussed the procedure including the risks, benefits and alternatives for the proposed anesthesia with the patient or authorized representative who has indicated his/her understanding and acceptance.   Dental advisory given  Plan Discussed with: CRNA  Anesthesia Plan Comments:         Anesthesia Quick Evaluation

## 2014-09-04 NOTE — H&P (Signed)
H&P  Chief Complaint: Blocked left kidney  History of Present Illness: Bridget Ball is a 81 y.o. year old female who presents for exchange of a left J2 stent--originally placed 1 year ago for management of a probable left congenital UPJ obstruction with associated pyelonephritis. It was felt that management of her UPJ obstruction would be better served w/ a stent rather than operative mnmt.  Past Medical History  Diagnosis Date  . COPD (chronic obstructive pulmonary disease)   . Arthritis   . Depression   . Memory difficulties   . Chronic diarrhea   . Hiatal hernia   . Cancer     Skin cancer  . Hyperlipidemia   . Myocardial infarction     unknown time   . Shortness of breath     with exertion  . Seizures     x 2 last year due to bleeding to the brain.  . Stroke     TIA four years ago / stroke NOV 2014  . Bruises easily   . GERD (gastroesophageal reflux disease)   . Microscopic colitis   . Colitis   . Hydronephrosis, left   . Hx of septic shock JULY 2015  . Dementia   . Anxiety   . Hx of pulmonary edema JULY 2015  . Attention to nephrostomy     PT HAS NEPHROSTOMY TUBE IN PLACE  . Hx of bronchitis   . Hx of pulmonary edema JULY 2015    Past Surgical History  Procedure Laterality Date  . Appendectomy    . Skin cancer excision    . Tubal ligation    . Colonoscopy N/A 05/24/2013    Procedure: COLONOSCOPY;  Surgeon: Rogene Houston, MD;  Location: [facility];  Service: Endoscopy;  Laterality: N/A;  100  . Esophagogastroduodenoscopy N/A 05/24/2013    Procedure: ESOPHAGOGASTRODUODENOSCOPY (EGD);  Surgeon: Rogene Houston, MD;  Location: [facility];  Service: Endoscopy;  Laterality: N/A;  . Tonsillectomy    . Cystoscopy with retrograde pyelogram, ureteroscopy and stent placement Left 09/18/2013    Procedure: CYSTOSCOPY WITH RETROGRADE PYELOGRAM,  URETEROSCOPY AND  STENT PLACEMENT, removal of nephrostomy tube;  Surgeon: Jorja Loa, MD;  Location: [facility];   Service: Urology;  Laterality: Left;  . Breast biopsy Right 02/02/2014    Procedure: RIGHT BREAST BIOPSY;  Surgeon: Jamesetta So, MD;  Location: [facility];  Service: General;  Laterality: Right;    Home Medications:  No prescriptions prior to admission    Allergies:  Allergies  Allergen Reactions  . Penicillins Anaphylaxis    Throat swelling as a child. Tolerated ceftriaxone 07/29/13.    Family History  Problem Relation Age of Onset  . Diabetes Mother     Social History:  reports that she quit smoking about 16 years ago. She has never used smokeless tobacco. She reports that she drinks about 4.2 oz of alcohol per week. She reports that she does not use illicit drugs.  ROS: A complete review of systems was performed.  All systems are negative except for pertinent findings as noted.  Physical Exam:  Vital signs in last 24 hours:   General:  Alert and oriented, No acute distress HEENT: Normocephalic, atraumatic Neck: No JVD or lymphadenopathy Cardiovascular: Regular rate and rhythm Lungs: Clear bilaterally Abdomen: Soft, nontender, nondistended, no abdominal masses. Scaphoid. Back: No CVA tenderness Extremities: No edema Neurologic: Grossly intact  Laboratory Data:  No results found for this or any previous visit (from the past  24 hour(s)). No results found for this or any previous visit (from the past 240 hour(s)). Creatinine:  Recent Labs  08/31/14 1405  CREATININE 0.89    Radiologic Imaging: No results found.  Impression/Assessment:  Left UPJ obstruction w/ hydro--treated w/ a stent  Plan: Cysto, left J2 stent exchange  Jorja Loa 09/04/2014, 5:48 AM  Lillette Boxer. Demarqus Jocson MD

## 2014-09-04 NOTE — Anesthesia Postprocedure Evaluation (Signed)
  Anesthesia Post-op Note  Patient: Bridget Ball  Procedure(s) Performed: Procedure(s): CYSTOSCOPY WITH LEFT J2 STENT EXCHANGE (Left)  Patient Location: PACU  Anesthesia Type:MAC  Level of Consciousness: awake, alert , oriented and patient cooperative  Airway and Oxygen Therapy: Patient Spontanous Breathing and Patient connected to nasal cannula oxygen  Post-op Pain: none  Post-op Assessment: Post-op Vital signs reviewed, Patient's Cardiovascular Status Stable, Respiratory Function Stable, Patent Airway, No signs of Nausea or vomiting and Pain level controlled              Post-op Vital Signs: Reviewed and stable  Last Vitals:  Filed Vitals:   09/04/14 1150  BP: 100/68  Temp:   Resp: 17    Complications: No apparent anesthesia complications

## 2014-09-04 NOTE — Transfer of Care (Signed)
Immediate Anesthesia Transfer of Care Note  Patient: Bridget Ball  Procedure(s) Performed: Procedure(s): CYSTOSCOPY WITH LEFT J2 STENT EXCHANGE (Left)  Patient Location: PACU  Anesthesia Type:MAC  Level of Consciousness: awake, alert , oriented and patient cooperative  Airway & Oxygen Therapy: Patient Spontanous Breathing and Patient connected to face mask oxygen  Post-op Assessment: Report given to RN and Post -op Vital signs reviewed and stable  Post vital signs: Reviewed and stable  Last Vitals:  Filed Vitals:   09/04/14 1150  BP: 100/68  Temp:   Resp: 17    Complications: No apparent anesthesia complications

## 2014-09-04 NOTE — Discharge Instructions (Signed)
1. You may see some blood in the urine and may have some burning with urination for 48-72 hours. You also may notice that you have to urinate more frequently or urgently after your procedure which is normal.  2. You should call should you develop an inability urinate, fever > 101, persistent nausea and vomiting that prevents you from eating or drinking to stay hydrated.  3. If you have a stent, you will likely urinate more frequently and urgently until the stent is removed and you may experience some discomfort/pain in the lower abdomen and flank especially when urinating. You may take pain medication prescribed to you if needed for pain. You may also intermittently have blood in the urine until the stent is removed. 4. If you have a catheter, you will be taught how to take care of the catheter by the nursing staff prior to discharge from the hospital.  You may periodically feel a strong urge to void with the catheter in place.  This is a bladder spasm and most often can occur when having a bowel movement or moving around. It is typically self-limited and usually will stop after a few minutes.  You may use some Vaseline or Neosporin around the tip of the catheter to reduce friction at the tip of the penis. You may also see some blood in the urine.  A very small amount of blood can make the urine look quite red.  As long as the catheter is draining well, there usually is not a problem.  However, if the catheter is not draining well and is bloody, you should call the office [phone]) to notify us. Monitored Anesthesia Care Monitored anesthesia care is an anesthesia service for a medical procedure. Anesthesia is the loss of the ability to feel pain. It is produced by medicines called anesthetics. It may affect a small area of your body (local anesthesia), a large area of your body (regional anesthesia), or your entire body (general anesthesia). The need for monitored anesthesia care depends your procedure,  your condition, and the potential need for regional or general anesthesia. It is often provided during procedures where:   General anesthesia may be needed if there are complications. This is because you need special care when you are under general anesthesia.   You will be under local or regional anesthesia. This is so that you are able to have higher levels of anesthesia if needed.   You will receive calming medicines (sedatives). This is especially the case if sedatives are given to put you in a semi-conscious state of relaxation (deep sedation). This is because the amount of sedative needed to produce this state can be hard to predict. Too much of a sedative can produce general anesthesia. Monitored anesthesia care is performed by one or more health care providers who have special training in all types of anesthesia. You will need to meet with these health care providers before your procedure. During this meeting, they will ask you about your medical history. They will also give you instructions to follow. (For example, you will need to stop eating and drinking before your procedure. You may also need to stop or change medicines you are taking.) During your procedure, your health care providers will stay with you. They will:   Watch your condition. This includes watching your blood pressure, breathing, and level of pain.   Diagnose and treat problems that occur.   Give medicines if they are needed. These may include calming medicines (sedatives) and anesthetics.  Make sure you are comfortable.  Having monitored anesthesia care does not necessarily mean that you will be under anesthesia. It does mean that your health care providers will be able to manage anesthesia if you need it or if it occurs. It also means that you will be able to have a different type of anesthesia than you are having if you need it. When your procedure is complete, your health care providers will continue to watch  your condition. They will make sure any medicines wear off before you are allowed to go home.  Document Released: 09/17/2004 Document Revised: 05/08/2013 Document Reviewed: 02/03/2012 [facility] Patient Information 2015 [place], [place]. This information is not intended to replace advice given to you by your health care provider. Make sure you discuss any questions you have with your health care provider. Ureteral Stent Implantation, Care After Refer to this sheet in the next few weeks. These instructions provide you with information on caring for yourself after your procedure. Your health care provider may also give you more specific instructions. Your treatment has been planned according to current medical practices, but problems sometimes occur. Call your health care provider if you have any problems or questions after your procedure. WHAT TO EXPECT AFTER THE PROCEDURE You should be back to normal activity within 48 hours after the procedure. Nausea and vomiting may occur and are commonly the result of anesthesia. It is common to experience sharp pain in the back or lower abdomen and penis with voiding. This is caused by movement of the ends of the stent with the act of urinating.It usually goes away within minutes after you have stopped urinating. HOME CARE INSTRUCTIONS Make sure to drink plenty of fluids. You may have small amounts of bleeding, causing your urine to be red. This is normal. Certain movements may trigger pain or a feeling that you need to urinate. You may be given medicines to prevent infection or bladder spasms. Be sure to take all medicines as directed. Only take over-the-counter or prescription medicines for pain, discomfort, or fever as directed by your health care provider. Do not take aspirin, as this can make bleeding worse. Your stent will be left in until the blockage is resolved. This may take 2 weeks or longer, depending on the reason for stent implantation. You may have an  X-ray exam to make sure your ureter is open and that the stent has not moved out of position (migrated). The stent can be removed by your health care provider in the office. Medicines may be given for comfort while the stent is being removed. Be sure to keep all follow-up appointments so your health care provider can check that you are healing properly. SEEK MEDICAL CARE IF:  You experience increasing pain.  Your pain medicine is not working. SEEK IMMEDIATE MEDICAL CARE IF:  Your urine is dark red or has blood clots.  You are leaking urine (incontinent).  You have a fever, chills, feeling sick to your stomach (nausea), or vomiting.  Your pain is not relieved by pain medicine.  The end of the stent comes out of the urethra.  You are unable to urinate. Document Released: 08/24/2012 Document Revised: 12/27/2012 Document Reviewed: 08/24/2012 [facility] Patient Information 2015 [place], [place]. This information is not intended to replace advice given to you by your health care provider. Make sure you discuss any questions you have with your health care provider.

## 2014-09-04 NOTE — Op Note (Signed)
PATIENT:  Bridget Ball  PRE-OPERATIVE DIAGNOSIS: Left hydronephrosis, status post placement of double-J stent  POST-OPERATIVE DIAGNOSIS: Same  PROCEDURE: Cystoscopy, left double-J stent extraction, left retrograde ureteropyelogram with interpretive fluoroscopy, left double-J stent replacement-24 cm x 7 [place] without string  SURGEON:  Lillette Boxer. Ismahan Lippman, M.D.  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 She underwent urgent stenting. Since that time, she has maintained a double-J stent which she has tolerated well. Due to the patient's age and concurrent medical condition, it was recommended that she just continue with stent placement. She presents at this time for double-J stent exchange.  Description of procedure: The patient was properly identified and marked (if applicable) in the holding area. They were then  taken to the operating room and placed on the table in a supine position. IV sedation was then administered. Once fully sedated the patient was moved to the dorsolithotomy position and the genitalia and perineum were sterilely prepped and draped in standard fashion. An official timeout was then performed.  A 23 French panendoscope was advanced into her bladder. Bladder was inspected circumferentially. There were no tumors trabeculations or foreign bodies. Mild erythema and irregular urothelium around the left ureteral orifice which housed a double-J stent. There were mild calcifications on the stent. It was easily brought out through the urethral meatus. I then passed a 0.038 inch sensor-tip guidewire through the stent and up into her left kidney. The stent was removed over top the guidewire,  and over top of the guidewire then placed a 6 [place] open-ended catheter to the distal ureter. The guidewire was removed.  Retrograde ureteropyelogram was performed. This revealed a normal ureter, without tortuosity, stricture or hydroureter. There was beaking of the renal pelvis, with obvious findings of ureteropelvic junction obstruction. Pyelo-calyceal system was capacious but otherwise normal.  I then replaced the guidewire through the open-ended catheter, and using a cystoscope, once a guidewire had been negotiated into the renal pelvis, passed a 24 cm x 7 French contour stent, with the string having been removed. Good proximal and distal curls were seen. I then emptied the bladder and removed the scope. The patient tolerated procedure well and was returned to the PACU in stable condition.    PLAN OF CARE: Discharge to home after PACU  PATIENT DISPOSITION:  PACU - hemodynamically stable.

## 2014-09-04 NOTE — Anesthesia Procedure Notes (Signed)
Procedure Name: MAC Date/Time: 09/04/2014 12:14 PM Performed by: Andree Elk, Glady Ouderkirk A Pre-anesthesia Checklist: Patient identified, Timeout performed, Emergency Drugs available, Suction available and Patient being monitored Patient Re-evaluated:Patient Re-evaluated prior to inductionOxygen Delivery Method: Simple face mask

## 2014-09-05 ENCOUNTER — Encounter (HOSPITAL_COMMUNITY): Payer: Self-pay | Admitting: Urology

## 2014-09-10 ENCOUNTER — Other Ambulatory Visit (INDEPENDENT_AMBULATORY_CARE_PROVIDER_SITE_OTHER): Payer: Self-pay | Admitting: Internal Medicine

## 2014-10-01 ENCOUNTER — Ambulatory Visit (INDEPENDENT_AMBULATORY_CARE_PROVIDER_SITE_OTHER): Payer: Medicare Other | Admitting: Internal Medicine

## 2014-10-03 ENCOUNTER — Ambulatory Visit (INDEPENDENT_AMBULATORY_CARE_PROVIDER_SITE_OTHER): Payer: Medicare Other | Admitting: Neurology

## 2014-10-03 ENCOUNTER — Encounter: Payer: Self-pay | Admitting: Neurology

## 2014-10-03 VITALS — BP 115/63 | HR 73 | Ht 61.0 in | Wt 105.2 lb

## 2014-10-03 DIAGNOSIS — G3184 Mild cognitive impairment, so stated: Secondary | ICD-10-CM | POA: Diagnosis not present

## 2014-10-03 NOTE — Patient Instructions (Signed)
I had a long discussion the patient and daughter regarding her mild cognitive impairment which appears to be stable. Continue Cerefolin and encouraged the patient to participate  in cognitively challenging activities like playing bridge, crossword puzzles and sudoku. She was also encouraged to continue Keppra and stay on all her medications. She was advised to see if she has interest possibly participate in this CREAD early dementia study. She was given information to review at home and call us if interested. Return for follow-up in 6 months or call earlier if necessary.

## 2014-10-03 NOTE — Progress Notes (Signed)
PATIENT: Bridget Ball DOB: 01/14/1928  REASON FOR VISIT: routine follow up for memory loss HISTORY FROM: patient, daughter  HISTORY OF PRESENT ILLNESS: Update 09/01/13 (LL): Since last visit patient started on Celexa and no longer has crying spells or depression symptoms. Short-term memory problems and confusion have persisted. Since last visit she got very sick with colitis, had ureter obstruction requiring nephrostomy tube, and then sepsis with an extended hospitalization. She was discharged to the [facility] in [place], with plans to discharge to daughter's home soon. She is still receiving IV antibiotics. She has had significant weight loss, but is regaining strength at rehabilitation. An intake assessment at the nursing center suggested early dementia and daughter and other family members have noticed problems too. Patient denies any problem with memory other than old age. Daughter would like to get her started on something to help her keep from regressing as much as possible and patient is agreeable to it. Geriatric depression score has improved from 10 at last visit to 1 today. MMSE score dropped 1 point from 27-26. Animal fluency score dropped from 12-10 today. In clock drawing score dropped from 4/4 to 2/4 today. She draws numbers backwards on the clock face and does not add in hands to show the time. She is unable to copy 3-dimensional figure.  Update 04/25/2013 ; She returns for followup of her last visit 3 months ago. She is accompanied by her daughter. The patient has not been taking Celexa consistently on regular basis and she states that she forgets to take the pills at times. She continues to have a mild memory difficulties. She is states that she is under significant stress because she has had diarrhea and stomach upset problems and she plans to have endoscopy soon. She's also lost 15 pounds within the last few months. Patient has reduced the dose of Keppra to 250 twice  daily and she's not had any episodes of sensory disturbance or seizures. She underwent EEG on 02/01/13 which was normal without any epileptiform activity. MRI scan of the brain on 02/10/13 shows remote age hemorrhage and blood products in the right parietal subarachnoid spaces which were improved and persistent right frontal hyperintensity which was also improved compared with previous scan from 11/20/2012. No acute abnormality was noted.   Initial Consult 01/24/2013 ; 66 year Caucasian lady seen for followup following hospital admission on 11/19/12 for subarachnoid hemorrhage and seizures. She presented with recurrent transient episodes of left body paresthesias and difficulty speaking. The episode began in the left arm and gradually over minutes progressed to involve the face and lasted several minutes only. At times she was unable to form words and speak but she could understand what other people were saying. She described this as a feeling of pins and needles but without any impairment of consciousness. She had similar episodes in 2010 when she had a similar localized right frontal convexity subarachnoid hemorrhage. She had had no head trauma or significant headaches or had bumped her head. And at times MRI scan had shown similar findings to the current one which showed localized blood in the right frontal convexity with some diffusion positive changes suggesting microinfarction adjacent. She previously has cerebral catheter angiogram performed which showed no evidence of underlying AVM, aneurysms or vascular abnormalities. She was started on Neurontin for seizures and did well for a while but then gradually decided to stop the medication she did follow up with me for 1 year but subsequently was lost to followup.  She had EEG during the previous admission and 2010 which did not show definite seizure activity.She had a repeat EEG on 11/21/12 which was normal. CT angiogram of the brain on 11/16/14and showed no  evidence of underlying aneurysm or vascular abnormality. Urine drug screen was negative. Patient was started on Keppra initially 500 twice a day subsequent when episodes persisted was increased to thousand twice a day and Depakote ER was added. Patient states she's did well since discharge and has not had recurrent episodes of numbness however she has had confusion and agitation issues and hence has stopped the Depakote and in fact has even reduce the Keppra to 500 twice daily. Patient complains of short-term problems as well as intermittent confusion, balance issues. She forgets how to use a credit card and daughter has to help her in on several occasions. She previously had home so [place] which appear to be healing now and she does see a home care nurse twice a week. She hasn't never been diagnosed in the past with depression or been on any medication for that. She denies headache, focal extremity weakness, numbness or vision problems.  Update 03/21/2014 : She returns for follow-up today after last visit 6 months ago. She was started on Patch at last visit but did not tolerate it well due to behavioral outbursts and it was discontinued. She was hospitalized with kidney infection and septic shock and almost died. She underwent kidney stent and had a prolonged stay at pain Center for rehabilitation. She is doing a lot better now. She does take Cerefolin every day which seems to help. When she ran out of Cerefolin did notice cognitive worsening. She also is on Celexa for depression which keeps her mood stable. She has no new complaints today. On Mini-Mental status exam today she scored 30/30 which is an improvement from 27/30 at last visit. She has not had any seizures and is tolerating Keppra well without side effects Update 10/03/2014 : She returns for follow-up after last visit 6 months ago. Physical and my daughter. She states her short-term memory and cognitive difficulties about unchanged. She lives alone she  is independent in all activities of daily living. She in fact takes her medications. She states her balance is good she's not had any recent falls. There have been no episodes of confusion, delusional hallucinations or agitation noted. She continues to do Cerefolin NAC. She does read the but does not per spend time in doing   cognitively challenging activities routinely. ROS:  14 system review of systems is positive for   shortness of breath, murmur, memory loss and all other systems negative     ALLERGIES: Allergies  Allergen Reactions  . Penicillins Anaphylaxis    Throat swelling as a child. Tolerated ceftriaxone 07/29/13.    HOME MEDICATIONS: Outpatient Prescriptions Prior to Visit  Medication Sig Dispense Refill  . acetaminophen (TYLENOL) 500 MG tablet Take 1,000 mg by mouth every 6 (six) hours as needed for mild pain.    Marland Kitchen albuterol (PROVENTIL HFA;VENTOLIN HFA) 108 (90 BASE) MCG/ACT inhaler Inhale 2 puffs into the lungs every 6 (six) hours as needed for wheezing or shortness of breath. 1 Inhaler 0  . atorvastatin (LIPITOR) 80 MG tablet Take 1 tablet (80 mg total) by mouth daily at 6 PM. 30 tablet 0  . budesonide-formoterol (SYMBICORT) 160-4.5 MCG/ACT inhaler Inhale 2 puffs into the lungs 2 (two) times daily. 1 Inhaler 0  . citalopram (CELEXA) 10 MG tablet TAKE 1 TABLET (10 MG TOTAL) BY  MOUTH DAILY. 90 tablet 0  . diphenhydrAMINE (BENADRYL) 25 MG tablet Take 50 mg by mouth once as needed for itching.    . feeding supplement, ENSURE COMPLETE, (ENSURE COMPLETE) LIQD Take 237 mLs by mouth 3 (three) times daily between meals. 90 Bottle 0  . levETIRAcetam (KEPPRA) 250 MG tablet TAKE 1 TABLET BY MOUTH TWICE A DAY 60 tablet 3  . Methylfol-Methylcob-Acetylcyst (CEREFOLIN NAC PO) Take 1 tablet by mouth daily.     . pantoprazole (PROTONIX) 40 MG tablet TAKE 1 TABLET BY MOUTH EVERY MORNING WITH BREAKFAST 30 tablet 5  . solifenacin (VESICARE) 5 MG tablet Take 5 mg by mouth daily.    Marland Kitchen SPIRIVA  HANDIHALER 18 MCG inhalation capsule Place 1 capsule into inhaler and inhale daily.  1  . sulfamethoxazole-trimethoprim (BACTRIM DS,SEPTRA DS) 800-160 MG per tablet Take 1 tablet by mouth 2 (two) times daily. (Patient not taking: Reported on 10/03/2014) 6 tablet 0   No facility-administered medications prior to visit.    PHYSICAL EXAM Filed Vitals:   10/03/14 1448  BP: 115/63  Pulse: 73  Height: 5\' 1"  (1.549 m)  Weight: 105 lb 3.2 oz (47.718 kg)   Body mass index is 19.89 kg/(m^2). No exam data present No flowsheet data found.  MMSE - Mini Mental State Exam 03/21/2014 09/01/2013 04/25/2013  Orientation to time 5 2 4   Orientation to Place 5 5 5   Registration 3 3 3   Attention/ Calculation 5 5 5   Recall 3 3 1   Language- name 2 objects 2 2 2   Language- repeat 1 1 1   Language- follow 3 step command 3 3 3   Language- read & follow direction 1 1 1   Write a sentence 1 1 1   Copy design 1 0 1  Total score 30 26 27     Physical Exam  General:  , frail elderly Caucasian lady, seated, in no evident distress  Head: head normocephalic and atraumatic.    Neck: supple with no carotid or supraclavicular bruits  Cardiovascular: regular rate and rhythm, no murmurs  Musculoskeletal: no deformity  Skin: no rash/petichiae  Vascular: Normal pulses all extremities   Neurologic Exam  Mental Status: Awake and fully alert. Oriented to place and time. Recent and remote memory grossly intact. Attention span, concentration and fund of knowledge appropriate. Mood and affect appear normal. Mini-Mental status exam  not done. Recall 1/3. Animal naming test 10. Clock drawing 4/4. Cranial Nerves: Fundoscopic exam not done. Pupils equal, briskly reactive to light. Extraocular movements full without nystagmus. Visual fields full to confrontation. Hearing intact. Facial sensation intact. Face, tongue, palate moves normally and symmetrically.  Motor: Normal bulk and tone. Normal strength in all tested extremity  muscles.  Sensory: intact to light touch in all 4 extremities. Coordination: Rapid alternating movements normal in all extremities. Finger-to-nose and heel-to-shin performed accurately bilaterally.  Gait and Station: Arises from chair without difficulty. Stance is normal. Gait demonstrates normal stride length, unsteady balance. Not able to heel, toe and tandem walk without difficulty.  Reflexes: 1+ and symmetric. Toes downgoing.   ASSESSMENT:  89 year Caucasian lady with a recurrent right frontal convexity localized tiny subarachnoid hemorrhage with simple partial seizures. Prior history of similar episode in 2010 with cerebral catheter angiogram showing no underlying vascular abnormality. Persistent mild confusion and memory loss likely early Alzheimer's Dementia, as depression has been treated. But both appear stable now.  PLAN:  I had a long discussion the patient and daughter regarding her mild cognitive impairment which appears to  be stable. Continue Cerefolin and encouraged the patient to participate  in cognitively challenging activities like playing bridge, crossword puzzles and sudoku. She was also encouraged to continue Keppra and stay on all her medications. She was advised to see if she has interest possibly participate in this CREAD early dementia study. She was given information to review at home and call us if interested. Return for follow-up in 6 months or call earlier if necessary.   Meds ordered this encounter  Medications  .                          Marland Kitchen                           Return in about 6 months (around 04/02/2015).  Antony Contras, MD  10/03/2014, 5:40 PM [facility] [address] [place], [place] [address] [phone]  Note: This document was prepared with digital dictation and possible smart phrase technology. Any transcriptional errors that result from this process are unintentional.

## 2014-10-11 ENCOUNTER — Ambulatory Visit (INDEPENDENT_AMBULATORY_CARE_PROVIDER_SITE_OTHER): Payer: Medicare Other | Admitting: Internal Medicine

## 2014-10-11 ENCOUNTER — Telehealth: Payer: Self-pay

## 2014-10-11 ENCOUNTER — Encounter (INDEPENDENT_AMBULATORY_CARE_PROVIDER_SITE_OTHER): Payer: Self-pay | Admitting: Internal Medicine

## 2014-10-11 VITALS — BP 112/66 | HR 68 | Temp 97.6°F | Resp 18 | Ht 61.0 in | Wt 105.5 lb

## 2014-10-11 DIAGNOSIS — K52839 Microscopic colitis, unspecified: Secondary | ICD-10-CM | POA: Diagnosis not present

## 2014-10-11 DIAGNOSIS — K2100 Gastro-esophageal reflux disease with esophagitis, without bleeding: Secondary | ICD-10-CM

## 2014-10-11 DIAGNOSIS — R634 Abnormal weight loss: Secondary | ICD-10-CM | POA: Diagnosis not present

## 2014-10-11 NOTE — Telephone Encounter (Signed)
I left a message for the patient to return my call.

## 2014-10-11 NOTE — Progress Notes (Signed)
Presenting complaint;  Follow-up for microscopic colitis GERD and weight loss.  Subjective:  Bridget Ball is here for scheduled visit. She had her 86th birthday a few days ago. She says she is feeling much better than she did last year. She is not having diarrhea anymore. She generally has 1 bowel movement per day. She is trying to eat fiber rich foods. She takes ibuprofen or Naprosyn occasionally. She denies abdominal pain melena or rectal bleeding. She says heartburn is not very often. She also denies dysphagia. She noted black stools but then relies she taken Pepto-Bismol. She has gained 5 pounds in the last 7 months. She is hoping to gain from oh pounds but does not want to get back to where she used to be. She is eating 3 meals a day. She is also trying to bring and showed in between. She has lot of energy and is excited to be able to work in the garden.     Current Medications: Outpatient Encounter Prescriptions as of 10/11/2014  Medication Sig  . albuterol (PROVENTIL HFA;VENTOLIN HFA) 108 (90 BASE) MCG/ACT inhaler Inhale 2 puffs into the lungs every 6 (six) hours as needed for wheezing or shortness of breath.  Marland Kitchen atorvastatin (LIPITOR) 80 MG tablet Take 1 tablet (80 mg total) by mouth daily at 6 PM.  . budesonide-formoterol (SYMBICORT) 160-4.5 MCG/ACT inhaler Inhale 2 puffs into the lungs 2 (two) times daily.  . calcium carbonate (TUMS - DOSED IN MG ELEMENTAL CALCIUM) 500 MG chewable tablet Chew 1 tablet by mouth as needed for indigestion or heartburn.  . citalopram (CELEXA) 10 MG tablet TAKE 1 TABLET (10 MG TOTAL) BY MOUTH DAILY.  . feeding supplement, ENSURE COMPLETE, (ENSURE COMPLETE) LIQD Take 237 mLs by mouth 3 (three) times daily between meals.  Marland Kitchen ibuprofen (ADVIL,MOTRIN) 200 MG tablet Take 200 mg by mouth as needed.  . levETIRAcetam (KEPPRA) 250 MG tablet TAKE 1 TABLET BY MOUTH TWICE A DAY  . Methylfol-Methylcob-Acetylcyst (CEREFOLIN NAC PO) Take 1 tablet by mouth daily.   . pantoprazole  (PROTONIX) 40 MG tablet TAKE 1 TABLET BY MOUTH EVERY MORNING WITH BREAKFAST  . solifenacin (VESICARE) 5 MG tablet Take 5 mg by mouth daily.  Marland Kitchen SPIRIVA HANDIHALER 18 MCG inhalation capsule Place 1 capsule into inhaler and inhale daily.  . Terbinafine HCl 1 % SOLN Apply topically 2 (two) times daily.  . [DISCONTINUED] acetaminophen (TYLENOL) 500 MG tablet Take 1,000 mg by mouth every 6 (six) hours as needed for mild pain.  . [DISCONTINUED] diphenhydrAMINE (BENADRYL) 25 MG tablet Take 50 mg by mouth once as needed for itching.   No facility-administered encounter medications on file as of 10/11/2014.     Objective: Blood pressure 112/66, pulse 68, temperature 97.6 F (36.4 C), temperature source Oral, resp. rate 18, height 5\' 1"  (1.549 m), weight 105 lb 8 oz (47.854 kg). Patient is alert and in no acute distress. Conjunctiva is pink. Sclera is nonicteric Oropharyngeal mucosa is normal. No neck masses or thyromegaly noted. Cardiac exam with regular rhythm normal S1 and S2. No murmur or gallop noted. Lungs are clear to auscultation. Abdomen is symmetrical soft and nontender without organomegaly or masses. No LE edema or clubbing noted.    Assessment:  #1. Diarrhea secondary to microscopic colitis. Her diarrhea has subsided and it may be because she is not taking NSAIDs on regular basis. #2. GERD. She is doing well with therapy. Will consider dropping PPI dose at next visit. #3. Weight loss. Appears to be multifactorial. Reassuring  to know that she has gained 5 pounds at last visit. Her baseline weight was between 120 and 124 pounds.    Plan:  Continue pantoprazole at current dose which is 40 mg by mouth every morning. Call if diarrhea relapses. Can take Colace 100 or 200 mg by mouth daily for constipation. Office visit in 6 months.

## 2014-10-11 NOTE — Patient Instructions (Signed)
Can take Colace for stool softener if you become constipated(100 mg twice daily or 200 mg at bedtime)

## 2014-10-16 ENCOUNTER — Telehealth (INDEPENDENT_AMBULATORY_CARE_PROVIDER_SITE_OTHER): Payer: Self-pay | Admitting: *Deleted

## 2014-10-16 NOTE — Telephone Encounter (Signed)
Gerry is needing CEREFOLIN refilled. Should be sent to [facility] and their phone number is [phone]. Nataleah called and was told she would need to call our office.

## 2014-10-17 NOTE — Telephone Encounter (Signed)
Bridget Ball called in and had forgot she called our office. She is trying to find out who prescribed this medicine. Per Tammy, she is going to try and find out for the patient. Dr. Laural Golden didn't prescribe the Cerefolin. Patient advised and she said thank you.

## 2014-10-17 NOTE — Telephone Encounter (Signed)
I called the patient's pharmacy as listed below. I was told that this medication was last filled by a Charlott Holler , office # [phone]. It was on 09/01/2013.

## 2014-10-17 NOTE — Telephone Encounter (Signed)
This is [facility] in [place]. Patient will be called 10/18/14.

## 2014-10-18 ENCOUNTER — Telehealth: Payer: Self-pay | Admitting: Neurology

## 2014-10-18 MED ORDER — CEREFOLIN NAC 6-90.314-2-600 MG PO TABS: 1.0000 | ORAL_TABLET | Freq: Every day | ORAL | Status: DC

## 2014-10-18 NOTE — Telephone Encounter (Signed)
Rx has been sent.  Receipt confirmed by pharmacy.  I called back to advise.  They are aware.  

## 2014-10-18 NOTE — Telephone Encounter (Signed)
Pt's daughter called requesting refill for cerefolin to be sent to [facility]. Please call and advise at [phone] once it has been sent.

## 2014-10-18 NOTE — Telephone Encounter (Signed)
Patient was made aware

## 2014-10-29 DIAGNOSIS — Z23 Encounter for immunization: Secondary | ICD-10-CM | POA: Diagnosis not present

## 2014-11-22 ENCOUNTER — Other Ambulatory Visit: Payer: Self-pay | Admitting: Neurology

## 2014-11-24 ENCOUNTER — Other Ambulatory Visit: Payer: Self-pay | Admitting: Neurology

## 2014-11-28 DIAGNOSIS — W19XXXA Unspecified fall, initial encounter: Secondary | ICD-10-CM | POA: Diagnosis not present

## 2014-11-28 DIAGNOSIS — J449 Chronic obstructive pulmonary disease, unspecified: Secondary | ICD-10-CM | POA: Diagnosis not present

## 2014-11-28 DIAGNOSIS — S81811A Laceration without foreign body, right lower leg, initial encounter: Secondary | ICD-10-CM | POA: Diagnosis not present

## 2015-01-01 ENCOUNTER — Other Ambulatory Visit: Payer: Self-pay | Admitting: Urology

## 2015-01-01 ENCOUNTER — Ambulatory Visit (INDEPENDENT_AMBULATORY_CARE_PROVIDER_SITE_OTHER): Payer: Medicare Other | Admitting: Urology

## 2015-01-01 DIAGNOSIS — N1330 Unspecified hydronephrosis: Secondary | ICD-10-CM

## 2015-01-21 DIAGNOSIS — E46 Unspecified protein-calorie malnutrition: Secondary | ICD-10-CM | POA: Diagnosis not present

## 2015-01-21 DIAGNOSIS — F5101 Primary insomnia: Secondary | ICD-10-CM | POA: Diagnosis not present

## 2015-02-07 DIAGNOSIS — R197 Diarrhea, unspecified: Secondary | ICD-10-CM | POA: Diagnosis not present

## 2015-02-07 DIAGNOSIS — E860 Dehydration: Secondary | ICD-10-CM | POA: Diagnosis not present

## 2015-02-07 DIAGNOSIS — I959 Hypotension, unspecified: Secondary | ICD-10-CM | POA: Diagnosis not present

## 2015-02-07 DIAGNOSIS — K529 Noninfective gastroenteritis and colitis, unspecified: Secondary | ICD-10-CM | POA: Diagnosis not present

## 2015-02-07 DIAGNOSIS — K591 Functional diarrhea: Secondary | ICD-10-CM | POA: Diagnosis not present

## 2015-02-11 DIAGNOSIS — R197 Diarrhea, unspecified: Secondary | ICD-10-CM | POA: Diagnosis not present

## 2015-02-11 DIAGNOSIS — K5190 Ulcerative colitis, unspecified, without complications: Secondary | ICD-10-CM | POA: Diagnosis not present

## 2015-02-15 ENCOUNTER — Telehealth (INDEPENDENT_AMBULATORY_CARE_PROVIDER_SITE_OTHER): Payer: Self-pay | Admitting: Internal Medicine

## 2015-02-15 DIAGNOSIS — I959 Hypotension, unspecified: Secondary | ICD-10-CM | POA: Diagnosis not present

## 2015-02-15 NOTE — Telephone Encounter (Signed)
Bridget Ball called saying the new medication she's been prescribed for constipation has now caused her stool to become almost completely liquid. She's been taking the medication for two days. Per Bridget Ball, she saw Dr. Nevada Crane yesterday and he told her to make an appt with Dr. Laural Golden. She's wondering if she can be worked in as she prefers to see him instead of Terri. Please give the patient a call.   Pt's ph# [phone]  Thank you.

## 2015-02-19 NOTE — Telephone Encounter (Signed)
Talked with patient. Should advised to take Imodium OTC 2 mg before breakfast and lunch. Try to find out if budesonide could be compounded so that it will be cheaper. Tammy, please find out if this is possible. She will need 9 mg daily for 2 weeks then 6 mg daily for 2 weeks and then 3 mg daily for 2 weeks.

## 2015-02-20 NOTE — Telephone Encounter (Signed)
I have talked with Nicki Reaper at [facility]. He states that he will talk with Nate and see if they could do this. As it will need to compounded so that it will work in the gut. He will call us back about this. When we hear from [place] we will contact the patient.

## 2015-02-22 DIAGNOSIS — I959 Hypotension, unspecified: Secondary | ICD-10-CM | POA: Diagnosis not present

## 2015-03-18 DIAGNOSIS — C44329 Squamous cell carcinoma of skin of other parts of face: Secondary | ICD-10-CM | POA: Diagnosis not present

## 2015-03-18 DIAGNOSIS — D1801 Hemangioma of skin and subcutaneous tissue: Secondary | ICD-10-CM | POA: Diagnosis not present

## 2015-03-18 DIAGNOSIS — C44619 Basal cell carcinoma of skin of left upper limb, including shoulder: Secondary | ICD-10-CM | POA: Diagnosis not present

## 2015-03-18 DIAGNOSIS — L814 Other melanin hyperpigmentation: Secondary | ICD-10-CM | POA: Diagnosis not present

## 2015-03-18 DIAGNOSIS — D225 Melanocytic nevi of trunk: Secondary | ICD-10-CM | POA: Diagnosis not present

## 2015-03-18 DIAGNOSIS — L570 Actinic keratosis: Secondary | ICD-10-CM | POA: Diagnosis not present

## 2015-03-18 DIAGNOSIS — D485 Neoplasm of uncertain behavior of skin: Secondary | ICD-10-CM | POA: Diagnosis not present

## 2015-03-18 DIAGNOSIS — D0462 Carcinoma in situ of skin of left upper limb, including shoulder: Secondary | ICD-10-CM | POA: Diagnosis not present

## 2015-03-19 ENCOUNTER — Ambulatory Visit: Payer: Medicare Other | Admitting: Neurology

## 2015-03-20 ENCOUNTER — Ambulatory Visit (INDEPENDENT_AMBULATORY_CARE_PROVIDER_SITE_OTHER): Payer: Medicare Other | Admitting: Adult Health

## 2015-03-20 ENCOUNTER — Encounter: Payer: Self-pay | Admitting: Adult Health

## 2015-03-20 VITALS — BP 116/72 | HR 68 | Resp 12 | Ht 61.0 in | Wt 100.5 lb

## 2015-03-20 DIAGNOSIS — R413 Other amnesia: Secondary | ICD-10-CM | POA: Diagnosis not present

## 2015-03-20 DIAGNOSIS — R569 Unspecified convulsions: Secondary | ICD-10-CM | POA: Diagnosis not present

## 2015-03-20 DIAGNOSIS — Z8679 Personal history of other diseases of the circulatory system: Secondary | ICD-10-CM | POA: Diagnosis not present

## 2015-03-20 MED ORDER — LEVETIRACETAM 250 MG PO TABS: 250.0000 mg | ORAL_TABLET | Freq: Two times a day (BID) | ORAL | Status: DC

## 2015-03-20 NOTE — Progress Notes (Signed)
I agree with the above plan 

## 2015-03-20 NOTE — Progress Notes (Signed)
PATIENT: Bridget Ball DOB: May 01, 1928  REASON FOR VISIT: follow up- seizures, memory impairment, subarachnoid hemorrhage                                                        HISTORY FROM: patient  HISTORY OF PRESENT ILLNESS:  Today 03/20/15: Bridget Ball is an 82 year old female with a history of subarachnoid hemorrhage in 2014, seizures and memory impairment. She returns today for an evaluation. The patient reports that her memory has remained stable. She is currently on Bridget. She states that she lives at home alone. She is able to complete all ADLs independently. She manages her own finances without difficulty. She is able to prepare her own meals without difficulty. Denies having to give up anything due to memory. The patient was called about the Cread study however she was not interested. The patient reports that she's not had any seizure episodes. She has continued on Keppra. She states about a month and a half ago she had a flareup with her colitis and has severe diarrhea. She became dehydrated and hypotensive. During that time she had numbness in the left hand and arm-- her primary care contributed to her blood pressure. She reports that since her blood pressure has normalized she has not had these symptoms. She returns today for an evaluation.  HISTORY  (Bridget Ball): Initial Consult 01/24/2013 ; 29 year Caucasian lady seen for followup following hospital admission on 11/19/12 for subarachnoid hemorrhage and seizures. She presented with recurrent transient episodes of left body paresthesias and difficulty speaking. The episode began in the left arm and gradually over minutes progressed to involve the face and lasted several minutes only. At times she was unable to form words and speak but she could understand what other people were saying. She described this as a feeling of pins and needles but without any impairment of consciousness. She had similar episodes in 2010 when she had a similar  localized right frontal convexity subarachnoid hemorrhage. She had had no head trauma or significant headaches or had bumped her head. And at times MRI scan had shown similar findings to the current one which showed localized blood in the right frontal convexity with some diffusion positive changes suggesting microinfarction adjacent. She previously has cerebral catheter angiogram performed which showed no evidence of underlying AVM, aneurysms or vascular abnormalities. She was started on Neurontin for seizures and did well for a while but then gradually decided to stop the medication she did follow up with me for 1 year but subsequently was lost to followup. She had EEG during the previous admission and 2010 which did not show definite seizure activity.She had a repeat EEG on 11/21/12 which was normal. CT angiogram of the brain on 11/16/14and showed no evidence of underlying aneurysm or vascular abnormality. Urine drug screen was negative. Patient was started on Keppra initially 500 twice a day subsequent when episodes persisted was increased to thousand twice a day and Depakote ER was added. Patient states she's did well since discharge and has not had recurrent episodes of numbness however she has had confusion and agitation issues and hence has stopped the Depakote and in fact has even reduce the Keppra to 500 twice daily. Patient complains of short-term problems as well as intermittent confusion, balance issues. She forgets how to use a  credit card and daughter has to help her in on several occasions. She previously had home so [place] which appear to be healing now and she does see a home care nurse twice a week. She hasn't never been diagnosed in the past with depression or been on any medication for that. She denies headache, focal extremity weakness, numbness or vision problems.  Update 03/21/2014 : She returns for follow-up today after last visit 6 months ago. She was started on Patch at last visit but did  not tolerate it well due to behavioral outbursts and it was discontinued. She was hospitalized with kidney infection and septic shock and almost died. She underwent kidney stent and had a [facility]. She is doing a lot better now. She does take Bridget every day which seems to help. When she ran out of Bridget did notice cognitive worsening. She also is on Celexa for depression which keeps her mood stable. She has no new complaints today. On Mini-Mental status exam today she scored 30/30 which is an improvement from 27/30 at last visit. She has not had any seizures and is tolerating Keppra well without side effects Update 10/03/2014 : She returns for follow-up after last visit 6 months ago. Physical and my daughter. She states her short-term memory and cognitive difficulties about unchanged. She lives alone she is independent in all activities of daily living. She in fact takes her medications. She states her balance is good she's not had any recent falls. There have been no episodes of confusion, delusional hallucinations or agitation noted. She continues to do Bridget Ball. She does read the but does not per spend time in doing cognitively challenging activities routinely.  REVIEW OF SYSTEMS: Out of a complete 14 system review of symptoms, the patient complains only of the following symptoms, and all other reviewed systems are negative.  Shortness of breath, unexpected weight change, memory loss, dizziness, murmur, diarrhea  ALLERGIES: Allergies  Allergen Reactions  . Penicillins Anaphylaxis    Throat swelling as a child. Tolerated ceftriaxone 07/29/13.    HOME MEDICATIONS: Outpatient Prescriptions Prior to Visit  Medication Sig Dispense Refill  . albuterol (PROVENTIL HFA;VENTOLIN HFA) 108 (90 BASE) MCG/ACT inhaler Inhale 2 puffs into the lungs every 6 (six) hours as needed for wheezing or shortness of breath. 1 Inhaler 0  . atorvastatin (LIPITOR) 80 MG  tablet Take 1 tablet (80 mg total) by mouth daily at 6 PM. 30 tablet 0  . budesonide-formoterol (SYMBICORT) 160-4.5 MCG/ACT inhaler Inhale 2 puffs into the lungs 2 (two) times daily. 1 Inhaler 0  . calcium carbonate (TUMS - DOSED IN MG ELEMENTAL CALCIUM) 500 MG chewable tablet Chew 1 tablet by mouth as needed for indigestion or heartburn.    . citalopram (CELEXA) 10 MG tablet TAKE 1 TABLET (10 MG TOTAL) BY MOUTH DAILY. 90 tablet 0  . feeding supplement, ENSURE COMPLETE, (ENSURE COMPLETE) LIQD Take 237 mLs by mouth 3 (three) times daily between meals. 90 Bottle 0  . ibuprofen (ADVIL,MOTRIN) 200 MG tablet Take 200 mg by mouth as needed.    . levETIRAcetam (KEPPRA) 250 MG tablet TAKE 1 TABLET BY MOUTH TWICE A DAY 60 tablet 5  . Methylfol-Algae-B12-Acetylcyst (Bridget Ball) 6-90.314-2-600 MG TABS Take 1 tablet by mouth daily. 90 tablet 3  . pantoprazole (PROTONIX) 40 MG tablet TAKE 1 TABLET BY MOUTH EVERY MORNING WITH BREAKFAST 30 tablet 5  . solifenacin (VESICARE) 5 MG tablet Take 5 mg by mouth daily.    Marland Kitchen  SPIRIVA HANDIHALER 18 MCG inhalation capsule Place 1 capsule into inhaler and inhale daily.  1  . Terbinafine HCl 1 % SOLN Apply topically 2 (two) times daily.     No facility-administered medications prior to visit.    PAST MEDICAL HISTORY: Past Medical History  Diagnosis Date  . COPD (chronic obstructive pulmonary disease) ([place])   . Arthritis   . Depression   . Memory difficulties   . Chronic diarrhea   . Hiatal hernia   . Cancer [facility])     Skin cancer  . Hyperlipidemia   . Myocardial infarction [facility])     unknown time   . Shortness of breath     with exertion  . Seizures ([place])     x 2 last year due to bleeding to the brain.  . Stroke [facility])     TIA four years ago / stroke NOV 2014  . Bruises easily   . GERD (gastroesophageal reflux disease)   . Microscopic colitis   . Colitis   . Hydronephrosis, left   . Hx of septic shock JULY 2015  . Dementia   . Anxiety   . Hx of  pulmonary edema JULY 2015  . Attention to nephrostomy ([place])     PT HAS NEPHROSTOMY TUBE IN PLACE  . Hx of bronchitis   . Hx of pulmonary edema JULY 2015    PAST SURGICAL HISTORY: Past Surgical History  Procedure Laterality Date  . Appendectomy    . Skin cancer excision    . Tubal ligation    . Colonoscopy N/A 05/24/2013    Procedure: COLONOSCOPY;  Surgeon: Rogene Houston, MD;  Location: [facility];  Service: Endoscopy;  Laterality: N/A;  100  . Esophagogastroduodenoscopy N/A 05/24/2013    Procedure: ESOPHAGOGASTRODUODENOSCOPY (EGD);  Surgeon: Rogene Houston, MD;  Location: [facility] ENDO SU[facility];  Service: Endoscopy;  Laterality: N/A;  . Tonsillectomy    . Cystoscopy with retrograde pyelogram, ureteroscopy and stent placement Left 09/18/2013    Procedure: CYSTOSCOPY WITH RETROGRADE PYELOGRAM,  URETEROSCOPY AND  STENT PLACEMENT, removal of nephrostomy tube;  Surgeon: Jorja Loa, MD;  Location: [facility] ORS;  Service: Urology;  Laterality: Left;  . Breast biopsy Right 02/02/2014    Procedure: RIGHT BREAST BIOPSY;  Surgeon: Jamesetta So, MD;  Location: [facility] ORS;  Service: General;  Laterality: Right;  . Cystoscopy with stent placement Left 09/04/2014    Procedure: CYSTOSCOPY WITH LEFT J2 STENT EXCHANGE;  Surgeon: Franchot Gallo, MD;  Location: [facility] ORS;  Service: Urology;  Laterality: Left;    FAMILY HISTORY: Family History  Problem Relation Age of Onset  . Diabetes Mother   . Stroke Mother     SOCIAL HISTORY: Social History   Social History  . Marital Status: Widowed    Spouse Name: N/A  . Number of Children: 6  . Years of Education: college   Occupational History  . retired    Social History Main Topics  . Smoking status: Former Smoker -- 1.00 packs/day for 50 years    Types: Cigarettes    Quit date: 11/13/1997  . Smokeless tobacco: Never Used  . Alcohol Use: 4.2 oz/week    7 Glasses of wine per week     Comment: 2 glasses a night wine (none in a year)  . Drug Use: No    . Sexual Activity: Not Currently   Other Topics Concern  . Not on file   Social History Narrative   Patient is widowed with 6  children.   Patient is right handed.   Patient has college education.   Patient drinks 4 cups daily.      PHYSICAL EXAM  Filed Vitals:   03/20/15 1346  BP: 116/72  Pulse: 68  Resp: 12  Height: 5\' 1"  (1.549 m)  Weight: 100 lb 8 oz (45.587 kg)   Body mass index is 19 kg/(m^2).   MMSE - Mini Mental State Exam 03/20/2015 03/21/2014 09/01/2013  Orientation to time 4 5 2   Orientation to Place 3 5 5   Registration 3 3 3   Attention/ Calculation 5 5 5   Recall 2 3 3   Language- name 2 objects 2 2 2   Language- repeat 1 1 1   Language- follow 3 step command 3 3 3   Language- read & follow direction 1 1 1   Write a sentence 1 1 1   Copy design 1 1 0  Total score 26 30 26      Generalized: Well developed, in no acute distress   Neurological examination  Mentation: Alert oriented to time, place, history taking. Follows all commands speech and language fluent Cranial nerve II-XII: Pupils were equal round reactive to light. Extraocular movements were full, visual field were full on confrontational test. Facial sensation and strength were normal. Uvula tongue midline. Head turning and shoulder shrug  were normal and symmetric. Motor: The motor testing reveals 5 over 5 strength of all 4 extremities. Good symmetric motor tone is noted throughout.  Sensory: Sensory testing is intact to soft touch on all 4 extremities. No evidence of extinction is noted.  Coordination: Cerebellar testing reveals good finger-nose-finger and heel-to-shin bilaterally.  Gait and station: Gait is normal.  Reflexes: Deep tendon reflexes are symmetric and normal bilaterally.   DIAGNOSTIC DATA (LABS, IMAGING, TESTING) - I reviewed patient records, labs, notes, testing and imaging myself where available.  Lab Results  Component Value Date   WBC 6.0 08/31/2014   HGB 12.3 08/31/2014   HCT  37.1 08/31/2014   MCV 98.4 08/31/2014   PLT 208 08/31/2014      Component Value Date/Time   NA 137 08/31/2014 1405   K 4.6 08/31/2014 1405   CL 103 08/31/2014 1405   CO2 28 08/31/2014 1405   GLUCOSE 95 08/31/2014 1405   BUN 13 08/31/2014 1405   CREATININE 0.89 08/31/2014 1405   CALCIUM 9.3 08/31/2014 1405   PROT 6.6 06/12/2014 0445   ALBUMIN 3.9 06/12/2014 0445   AST 21 06/12/2014 0445   ALT 17 06/12/2014 0445   ALKPHOS 49 06/12/2014 0445   BILITOT 0.4 06/12/2014 0445   GFRNONAA 58* 08/31/2014 1405   GFRAA >60 08/31/2014 1405    ASSESSMENT AND PLAN 82 y.o. year old female  has a past medical history of COPD (chronic obstructive pulmonary disease) ([place]); Arthritis; Depression; Memory difficulties; Chronic diarrhea; Hiatal hernia; Cancer ([place]); Hyperlipidemia; Myocardial infarction ([place]); Shortness of breath; Seizures ([place]); Stroke [facility]); Bruises easily; GERD (gastroesophageal reflux disease); Microscopic colitis; Colitis; Hydronephrosis, left; septic shock (JULY 2015); Dementia; Anxiety; pulmonary edema (JULY 2015); Attention to nephrostomy ([place]); bronchitis; and pulmonary edema (JULY 2015). here with:  1. Seizures 2. Memory disturbance  3. History of subarachnoid hemorrhage  Overall the patient is doing well. She will remain on Keppra for seizure prevention. Her memory score has slightly declined. Her MMSE today is 26/30 was previously 30/30. She will continue on Bridget. Patient advised that if her symptoms worsen or she develops any new symptoms she should let us know. She will follow-up in 6 months or  sooner if needed.   Ward Givens, MSN, NP-C 03/20/2015, 2:09 PM [facility] [address] [place], [place] [address] [phone]

## 2015-03-20 NOTE — Patient Instructions (Signed)
Continue Keppra and cerefolin  Memory score slightly decreased but we will continue to monitor If your symptoms worsen or you develop new symptoms please let us know.

## 2015-03-29 DIAGNOSIS — D485 Neoplasm of uncertain behavior of skin: Secondary | ICD-10-CM | POA: Diagnosis not present

## 2015-03-29 DIAGNOSIS — D0462 Carcinoma in situ of skin of left upper limb, including shoulder: Secondary | ICD-10-CM | POA: Diagnosis not present

## 2015-03-29 DIAGNOSIS — L570 Actinic keratosis: Secondary | ICD-10-CM | POA: Diagnosis not present

## 2015-03-29 DIAGNOSIS — L905 Scar conditions and fibrosis of skin: Secondary | ICD-10-CM | POA: Diagnosis not present

## 2015-04-09 ENCOUNTER — Encounter (INDEPENDENT_AMBULATORY_CARE_PROVIDER_SITE_OTHER): Payer: Self-pay | Admitting: Internal Medicine

## 2015-04-09 ENCOUNTER — Ambulatory Visit (INDEPENDENT_AMBULATORY_CARE_PROVIDER_SITE_OTHER): Payer: Medicare Other | Admitting: Internal Medicine

## 2015-04-09 VITALS — BP 98/62 | HR 68 | Temp 99.3°F | Resp 16 | Ht 61.0 in | Wt 99.1 lb

## 2015-04-09 DIAGNOSIS — K52839 Microscopic colitis, unspecified: Secondary | ICD-10-CM | POA: Diagnosis not present

## 2015-04-09 MED ORDER — LOPERAMIDE HCL 2 MG PO CAPS: 2.0000 mg | ORAL_CAPSULE | Freq: Every day | ORAL | Status: DC

## 2015-04-09 NOTE — Patient Instructions (Addendum)
Imodium OTC 2 mg by mouth every morning. Can decrease dose to half a tablet or 1 mg every morning if you develop constipation. Eat yogurt with each meal or in between meals as a snack. Weight check in 4 weeks. Hemoccult 1

## 2015-04-09 NOTE — Progress Notes (Signed)
Presenting complaint;   follow-up for diarrhea.  Subjective:  Patient is 82 year old Caucasian female with history of microscopic colitis who was last seen on 10/11/2014 and was doing ell. She weighed 105 pounds and she was complaining ofbeing constipated.she was advised to take Colace. She called over 6 weeks ago that diarrhea was back. She apparently had stool studies by Dr. [place] Ball. Patient was begun on Entocort EC.  She states she is feeling better. She is having 1-2 stools daily. Most of her stoools are watery. She lost her appetite while she was having multiple bowel movements. Her appetite is improved. She also complains of passing black stools over the last few days.she does not ta ibuprofe very often. Patient lives alone. She is accompanied by her daughter Bridget Ball who stays in contact with her every day.  Current Medications: Outpatient Encounter Prescriptions as of 04/09/2015  Medication Sig  . albuterol (PROVENTIL HFA;VENTOLIN HFA) 108 (90 BASE) MCG/ACT inhaler Inhale 2 puffs into the lungs every 6 (six) hours as needed for wheezing or shortness of breath.  Marland Kitchen atorvastatin (LIPITOR) 80 MG tablet Take 1 tablet (80 mg total) by mouth daily at 6 PM.  . budesonide (ENTOCORT EC) 3 MG 24 hr capsule Take 3 mg by mouth 2 (two) times daily.   . budesonide-formoterol (SYMBICORT) 160-4.5 MCG/ACT inhaler Inhale 2 puffs into the lungs 2 (two) times daily.  . calcium carbonate (TUMS - DOSED IN MG ELEMENTAL CALCIUM) 500 MG chewable tablet Chew 1 tablet by mouth as needed for indigestion or heartburn.  . citalopram (CELEXA) 10 MG tablet TAKE 1 TABLET (10 MG TOTAL) BY MOUTH DAILY.  . feeding supplement, ENSURE COMPLETE, (ENSURE COMPLETE) LIQD Take 237 mLs by mouth 3 (three) times daily between meals.  Marland Kitchen ibuprofen (ADVIL,MOTRIN) 200 MG tablet Take 200 mg by mouth as needed.  . levETIRAcetam (KEPPRA) 250 MG tablet Take 1 tablet (250 mg total) by mouth 2 (two) times daily.  . Methylfol-Algae-B12-Acetylcyst  (CEREFOLIN NAC) 6-90.314-2-600 MG TABS Take 1 tablet by mouth daily.  . pantoprazole (PROTONIX) 40 MG tablet TAKE 1 TABLET BY MOUTH EVERY MORNING WITH BREAKFAST  . solifenacin (VESICARE) 5 MG tablet Take 5 mg by mouth daily.  Marland Kitchen SPIRIVA HANDIHALER 18 MCG inhalation capsule Place 1 capsule into inhaler and inhale daily.  . [DISCONTINUED] Terbinafine HCl 1 % SOLN Apply topically 2 (two) times daily. Reported on 04/09/2015   No facility-administered encounter medications on file as of 04/09/2015.     Objective: Blood pressure 98/62, pulse 68, temperature 99.3 F (37.4 C), temperature source Oral, resp. rate 16, height 5\' 1"  (1.549 m), weight 99 lb 1.6 oz (44.951 kg). Patient is alert and in no acute distress. Conjunctiva is pink. Sclera is nonicteric Oropharyngeal mucosa is normal. No neck masses or thyromegaly noted. Cardiac exam with regular rhythm normal S1 and S2. No murmur or gallop noted. Lungs are clear to auscultation. Abdomen is flat. Bowel sounds are hyperactive. On palpation abdomen is soft and nontender without organomegaly or masses.  Rectal examination reveals no stool in the vault. Mucus is guaiac-negative. No LE edema or clubbing noted.    Assessment:  #1. Recurrent diarrhea secondary to microscopic colitis. She was having problems with constipation on her last visit 6 months ago. Her condition has relapsed. She appears to be on he road to recovery withEntocort EC orbit desonide #2. History of black stools. Rectal examination reveals no stool in the vlt and muu s guaiac-negative #3. Weight loss secondary to diarrhea.   Plan:  Patient advised to take Entocort EC 6 mg by mouth daily for 2 weeks and thereafter 3 mg by mouth daily for 2 weeks and stop. Imodium OTC 2 mg by mouth every morning.can decrease dose to 1 mg daily if need be. Patient advised to eat yogurt  with each meal. Hemoccult 1 Weight check in 4 weeks. Office visit in 2 months.

## 2015-04-17 ENCOUNTER — Telehealth (INDEPENDENT_AMBULATORY_CARE_PROVIDER_SITE_OTHER): Payer: Self-pay | Admitting: *Deleted

## 2015-04-17 NOTE — Telephone Encounter (Signed)
   Diagnosis:    Result(s)   Card 1:Negative:           Completed by: Thomas Hoff ,LPN   HEMOCCULT SENSA DEVELOPER: LOT#:  [id number] EXPIRATION DATE: 9-17   HEMOCCULT SENSA CARD:  LOT#:  [id number] EXPIRATION DATE: 07/18   CARD CONTROL RESULTS:  POSITIVE: Positive NEGATIVE: Negative    ADDITIONAL COMMENTS: Patient called and made aware of results. Forwarded to [place].

## 2015-04-19 DIAGNOSIS — H524 Presbyopia: Secondary | ICD-10-CM | POA: Diagnosis not present

## 2015-04-19 DIAGNOSIS — H52223 Regular astigmatism, bilateral: Secondary | ICD-10-CM | POA: Diagnosis not present

## 2015-04-19 DIAGNOSIS — H5203 Hypermetropia, bilateral: Secondary | ICD-10-CM | POA: Diagnosis not present

## 2015-04-19 DIAGNOSIS — Z961 Presence of intraocular lens: Secondary | ICD-10-CM | POA: Diagnosis not present

## 2015-04-22 DIAGNOSIS — L98491 Non-pressure chronic ulcer of skin of other sites limited to breakdown of skin: Secondary | ICD-10-CM | POA: Diagnosis not present

## 2015-04-22 DIAGNOSIS — S8012XA Contusion of left lower leg, initial encounter: Secondary | ICD-10-CM | POA: Diagnosis not present

## 2015-04-22 NOTE — Telephone Encounter (Signed)
Stool is guaiac negative. Patient states she is having formed stools and at times feels constipated.. Patient advised to drop Imodium dose to 1 mg every morning.

## 2015-04-26 DIAGNOSIS — L570 Actinic keratosis: Secondary | ICD-10-CM | POA: Diagnosis not present

## 2015-04-26 DIAGNOSIS — C44329 Squamous cell carcinoma of skin of other parts of face: Secondary | ICD-10-CM | POA: Diagnosis not present

## 2015-05-01 DIAGNOSIS — S8012XA Contusion of left lower leg, initial encounter: Secondary | ICD-10-CM | POA: Diagnosis not present

## 2015-05-06 ENCOUNTER — Ambulatory Visit (HOSPITAL_COMMUNITY): Payer: Medicare Other | Attending: Internal Medicine | Admitting: Physical Therapy

## 2015-05-06 DIAGNOSIS — X58XXXS Exposure to other specified factors, sequela: Secondary | ICD-10-CM | POA: Insufficient documentation

## 2015-05-06 DIAGNOSIS — S81802S Unspecified open wound, left lower leg, sequela: Secondary | ICD-10-CM | POA: Insufficient documentation

## 2015-05-06 DIAGNOSIS — M79662 Pain in left lower leg: Secondary | ICD-10-CM | POA: Insufficient documentation

## 2015-05-06 NOTE — Therapy (Signed)
[facility] [place], [place], [address] Phone: [phone]   Fax:  [phone]  Wound Care Evaluation  Patient Details  Name: Bridget Ball MRN: [id number] Date of Birth: [birth year] No Data Recorded  Encounter Date: 05/06/2015      PT End of Session - 05/06/15 1702    Visit Number 1   Number of Visits 12   Date for PT Re-Evaluation 05/27/15   Authorization Type UHC AARP [facility] complete plan/HMO    Authorization Time Period 05/06/15 to 06/17/15   Authorization - Visit Number 1   Authorization - Number of Visits 10   PT Start Time 1603   PT Stop Time 1639   PT Time Calculation (min) 36 min   Activity Tolerance Patient tolerated treatment well   Behavior During Therapy [facility] for tasks assessed/performed      Past Medical History  Diagnosis Date  . COPD (chronic obstructive pulmonary disease) ([place])   . Arthritis   . Depression   . Memory difficulties   . Chronic diarrhea   . Hiatal hernia   . Cancer [facility])     Skin cancer  . Hyperlipidemia   . Myocardial infarction [facility])     unknown time   . Shortness of breath     with exertion  . Seizures ([place])     x 2 last year due to bleeding to the brain.  . Stroke [facility])     TIA four years ago / stroke NOV 2014  . Bruises easily   . GERD (gastroesophageal reflux disease)   . Microscopic colitis   . Colitis   . Hydronephrosis, left   . Hx of septic shock JULY 2015  . Dementia   . Anxiety   . Hx of pulmonary edema JULY 2015  . Attention to nephrostomy ([place])     PT HAS NEPHROSTOMY TUBE IN PLACE  . Hx of bronchitis   . Hx of pulmonary edema JULY 2015    Past Surgical History  Procedure Laterality Date  . Appendectomy    . Skin cancer excision    . Tubal ligation    . Colonoscopy N/A 05/24/2013    Procedure: COLONOSCOPY;  Surgeon: Rogene Houston, MD;  [facility];  Service: Endoscopy;  Laterality: N/A;  100  . Esophagogastroduodenoscopy N/A 05/24/2013   Procedure: ESOPHAGOGASTRODUODENOSCOPY (EGD);  Surgeon: Rogene Houston, MD;  Location: [facility] ENDO SUITE;  Service: Endoscopy;  Laterality: N/A;  . Tonsillectomy    . Cystoscopy with retrograde pyelogram, ureteroscopy and stent placement Left 09/18/2013    Procedure: CYSTOSCOPY WITH RETROGRADE PYELOGRAM,  URETEROSCOPY AND  STENT PLACEMENT, removal of nephrostomy tube;  Surgeon: Jorja Loa, MD;  Location: [facility] ORS;  Service: Urology;  Laterality: Left;  . Breast biopsy Right 02/02/2014    Procedure: RIGHT BREAST BIOPSY;  Surgeon: Jamesetta So, MD;  Location: [facility] ORS;  Service: General;  Laterality: Right;  . Cystoscopy with stent placement Left 09/04/2014    Procedure: CYSTOSCOPY WITH LEFT J2 STENT EXCHANGE;  Surgeon: Franchot Gallo, MD;  Location: [facility] ORS;  Service: Urology;  Laterality: Left;    There were no vitals filed for this visit.         Wound Therapy - 05/06/15 1651    Subjective Patient and daughter arrive together, very pleasant and willing to particiapte sin skilled wound care. Daughter does advise that patient has early stages of dementia. They report that patient bumped her leg on the  table, got a bruise, and then bumped it again, oening up the bruise and creating this wound. They also report that MD wanted to send them to PT wound care just to be safe as there is some yellow covereing present on the wounds.    Patient and Family Stated Goals wounsd to heal    Date of Onset --  month ago    Pain Assessment No/denies pain   Evaluation and Treatment Procedures Explained to Patient/Family Yes   Evaluation and Treatment Procedures agreed to   Wound Properties Date First Assessed: 05/06/15 Wound Type: Other (Comment) Location: Tibial Location Orientation: Left Wound Description (Comments): lateral L tibia  Present on Admission: Yes   Dressing Type Gauze (Comment)   Dressing Changed Changed   Dressing Status Old drainage   Dressing Change Frequency PRN   % Wound base Red or  Granulating 10%   % Wound base Yellow 90%   Peri-wound Assessment Pink;Maceration   Wound Length (cm) 5 cm   Wound Width (cm) 5 cm   Wound Depth (cm) 0 cm   Tunneling (cm) 0   Drainage Amount Scant   Drainage Description Serosanguineous   Treatment Cleansed;Packing (Impregnated strip);Tape changed;Other (Comment)  medihoney, gauze, tape, netting    Wound Therapy - Clinical Statement Patient arrives with wounsd they report have been around abuot a month and that occurred after patient bumped her leg on a table multiple times. Main wound quite large, however patient does have two smaller wounds also on her L tibia that will be addressed by skilled wound care services; one wound approximately 1.0 cm long/1.5cm wide with 95% slough/5% granulation, and other wound 0.75cm by 0.75cm and 95% granulation tissue/5% slough. Dressed wounds with slough with medihoney, and largely granulated wound with xeroform. Did screen patient for arterial conditions: capillary refill appears WFL, no signfificant color change between LEs, and no significant temperature change bewteen LEs today however reocmmend screening again before use of compression. At this point recommend skilled PT services to facilitate full wound healing.     Wound Therapy - Functional Problem List open wounds L LE    Factors Delaying/Impairing Wound Healing Other (comment)  dementia    Wound Therapy - Frequency Other (comment)  2x/week    Wound Therapy - Current Recommendations PT   Wound Plan medihoney on sloughy wounds, xeroform; recheck for arterial signs before using compression                          PT Education - 05/06/15 1702    Education provided Yes   Education Details prognosis, plan of care, HEP    Person(s) Educated Patient;Child(ren)   Methods Explanation   Comprehension Verbalized understanding          PT Short Term Goals - 05/06/15 1704    PT SHORT TERM GOAL #1   Title Patient will demonstrate  slough 0% in all wounds in order to demonstrate improvement of general condition    Time 3   Period Weeks   Status New   PT SHORT TERM GOAL #2   Title Patient to demonstrate full resolution of two smaller wounds in order to demonstrate improvement of general condition    Time 3   Period Weeks   Status New   PT SHORT TERM GOAL #3   Title Patient to demonstrate reduction of size of largest wound by at least 50% in order to demonstrate improvement of condition    Time  3   Period Weeks   Status New           PT Long Term Goals - 05/06/15 1706    PT LONG TERM GOAL #1   Title Patient to demonstrate complete healing and closure of all 3 wounds in order to demonstrate improvement of condition    Time 6   Period Weeks   Status New   PT LONG TERM GOAL #2   Title Patient to be able to state 3 safety precautions for use in her home in order to prevent falls or accidents that might open additional wounds in order to improve safety at home    Time 6   Period Weeks   Status New            Patient will benefit from skilled therapeutic intervention in order to improve the following deficits and impairments:     Visit Diagnosis: Unspecified open wound, left lower leg, sequela - Plan: PT plan of care cert/re-cert  Pain in left lower leg - Plan: PT plan of care cert/re-cert      G-Codes - [id number]    Functional Assessment Tool Used Based on skilled clinical assessment of slough, wound healing status, pain    Functional Limitation Other PT primary   Other PT Primary Current Status [id number]) At least 60 percent but less than 80 percent impaired, limited or restricted   Other PT Primary Goal Status [id number]) At least 40 percent but less than 60 percent impaired, limited or restricted      Problem List Patient Active Problem List   Diagnosis Date Noted  . Bronchospasm 06/12/2014  . Acute respiratory failure with hypoxia ([place]) 06/12/2014  . Allergic reaction   . Esophageal  reflux 08/17/2013  . COPD (chronic obstructive pulmonary disease) ([place]) 08/11/2013  . Acute pyelonephritis 08/03/2013  . Septic shock due to Enterococcus species ([place]) 08/03/2013  . Septicemia ([place]) 08/03/2013  . Gram negative septic shock ([place]) 08/03/2013  . NSTEMI (non-ST elevated myocardial infarction) type 2 07/26/2013  . Dehydration 07/25/2013  . AKI (acute kidney injury) ([place]) 07/25/2013  . Elevated troponin 07/25/2013  . Colitis 07/08/2013  . Nausea and vomiting 07/08/2013  . Abdominal pain, unspecified site 07/08/2013  . Hydronephrosis, left 07/08/2013  . Microscopic colitis 06/27/2013  . Memory loss 04/25/2013  . Depression 04/25/2013  . Loss of weight 04/20/2013  . Melena 04/20/2013  . Diarrhea 04/20/2013  . SAH (subarachnoid hemorrhage) ([place]) 11/22/2012  . Seizures secondary to [facility]) 11/22/2012  . Bilateral lower leg cellulitis 11/13/2012  . Open wound of leg 11/13/2012  . Oral thrush 11/13/2012    Deniece Ree PT, DPT [place] [address] [place], [place], [address] Phone: [phone]   Fax:  [phone]  Name: KRISTANNA MADEN MRN: [id number] Date of Birth: Feb 08, 1928

## 2015-05-07 ENCOUNTER — Ambulatory Visit (HOSPITAL_COMMUNITY): Payer: Medicare Other | Admitting: Physical Therapy

## 2015-05-09 ENCOUNTER — Ambulatory Visit (HOSPITAL_COMMUNITY): Payer: Medicare Other

## 2015-05-09 DIAGNOSIS — S81802S Unspecified open wound, left lower leg, sequela: Secondary | ICD-10-CM | POA: Diagnosis not present

## 2015-05-09 DIAGNOSIS — M79662 Pain in left lower leg: Secondary | ICD-10-CM

## 2015-05-09 NOTE — Therapy (Signed)
[place], [place], [address] Phone: [phone]   Fax:  [phone]  Wound Care Therapy  Patient Details  Name: Bridget Ball MRN: [id number] Date of Birth: 1928-06-21 No Data Recorded  Encounter Date: 05/09/2015      PT End of Session - 05/09/15 1128    Visit Number 2   Number of Visits 12   Date for PT Re-Evaluation 05/27/15   Authorization Type UHC AARP [facility] complete plan/HMO    Authorization Time Period 05/06/15 to 06/17/15   Authorization - Visit Number 2   Authorization - Number of Visits 10   PT Start Time [id number]   PT Stop Time 1110   PT Time Calculation (min) 38 min   Activity Tolerance Patient tolerated treatment well   Behavior During Therapy [facility] for tasks assessed/performed      Past Medical History  Diagnosis Date  . COPD (chronic obstructive pulmonary disease) ([place])   . Arthritis   . Depression   . Memory difficulties   . Chronic diarrhea   . Hiatal hernia   . Cancer [facility])     Skin cancer  . Hyperlipidemia   . Myocardial infarction [facility])     unknown time   . Shortness of breath     with exertion  . Seizures ([place])     x 2 last year due to bleeding to the brain.  . Stroke [facility])     TIA four years ago / stroke NOV 2014  . Bruises easily   . GERD (gastroesophageal reflux disease)   . Microscopic colitis   . Colitis   . Hydronephrosis, left   . Hx of septic shock JULY 2015  . Dementia   . Anxiety   . Hx of pulmonary edema JULY 2015  . Attention to nephrostomy ([place])     PT HAS NEPHROSTOMY TUBE IN PLACE  . Hx of bronchitis   . Hx of pulmonary edema JULY 2015    Past Surgical History  Procedure Laterality Date  . Appendectomy    . Skin cancer excision    . Tubal ligation    . Colonoscopy N/A 05/24/2013    Procedure: COLONOSCOPY;  Surgeon: Rogene Houston, MD;  [facility];  Service: Endoscopy;  Laterality: N/A;  100  . Esophagogastroduodenoscopy N/A 05/24/2013   Procedure: ESOPHAGOGASTRODUODENOSCOPY (EGD);  Surgeon: Rogene Houston, MD;  Location[facility];  Service: Endoscopy;  Laterality: N/A;  . Tonsillectomy    . Cystoscopy with retrograde pyelogram, ureteroscopy and stent placement Left 09/18/2013    Procedure: CYSTOSCOPY WITH RETROGRADE PYELOGRAM,  URETEROSCOPY AND  STENT PLACEMENT, removal of nephrostomy tube;  Surgeon: Jorja Loa, MD;  Location: [facility] ORS;  Service: Urology;  Laterality: Left;  . Breast biopsy Right 02/02/2014    Procedure: RIGHT BREAST BIOPSY;  Surgeon: Jamesetta So, MD;  Location: [facility] ORS;  Service: General;  Laterality: Right;  . Cystoscopy with stent placement Left 09/04/2014    Procedure: CYSTOSCOPY WITH LEFT J2 STENT EXCHANGE;  Surgeon: Franchot Gallo, MD;  Location: [facility] ORS;  Service: Urology;  Laterality: Left;    There were no vitals filed for this visit.       Subjective Assessment - 05/09/15 1109    Subjective Pt stated skin was tender, no reports of pain today.  Dressings intact   Currently in Pain? No/denies            Wound Therapy - 05/09/15 1110  Subjective Pt stated skin was tender, no reports of pain today.  Dressings intact   Patient and Family Stated Goals wounds to heal    Date of Onset --  month ago   Pain Assessment No/denies pain  tender no pain scale given    Evaluation and Treatment Procedures Explained to Patient/Family Yes   Evaluation and Treatment Procedures agreed to   Wound Properties Date First Assessed: 05/06/15 Wound Type: Other (Comment) Location: Tibial Location Orientation: Left Wound Description (Comments): lateral L tibia  Present on Admission: Yes   Dressing Type Gauze (Comment)   Dressing Changed Changed   Dressing Status Old drainage   Dressing Change Frequency PRN   Site / Wound Assessment Clean;Dry;Pale   % Wound base Red or Granulating 35%   % Wound base Yellow 65%   % Wound base Black 0%   Peri-wound Assessment Pink   Drainage Amount Minimal    Drainage Description Serosanguineous   Treatment Cleansed;Debridement (Selective)   Selective Debridement - Location Lt LE multiple wounds   Selective Debridement - Tools Used Forceps;Scalpel;Scissors   Selective Debridement - Tissue Removed Dead skin and slough   Wound Therapy - Clinical Statement Pt with very dry skin, educated on importance of moisture.  Selective debridement for removal of slough mainly on lateral aspect of Lt shin and dry/dead skin perimeter to promote healing.  Skin very fragile, not reconmmend to apply medipore tape on skin.  Proximal wound with a lot of dead skin, removed skin to promote healing.  Xeroform placed on proximal wounds and honey applied to wounds with slough.  4x4 and gauze wrap applied, no compression necessary this session as no edema present.  No reports of pain at end of sessin   Wound Therapy - Functional Problem List open wounds L LE    Factors Delaying/Impairing Wound Healing Other (comment)  dementia   Wound Therapy - Frequency Other (comment)  2x/ week   Wound Therapy - Current Recommendations PT   Wound Plan Continue wound care with appropriate dressings.  No medipore tape to skin due to fragile skin   Dressing  medihoney on sloughy wounds, xeroform, lotion of skin, gauze wrap and netting             PT Short Term Goals - 05/06/15 1704    PT SHORT TERM GOAL #1   Title Patient will demonstrate slough 0% in all wounds in order to demonstrate improvement of general condition    Time 3   Period Weeks   Status New   PT SHORT TERM GOAL #2   Title Patient to demonstrate full resolution of two smaller wounds in order to demonstrate improvement of general condition    Time 3   Period Weeks   Status New   PT SHORT TERM GOAL #3   Title Patient to demonstrate reduction of size of largest wound by at least 50% in order to demonstrate improvement of condition    Time 3   Period Weeks   Status New           PT Long Term Goals - 05/06/15  1706    PT LONG TERM GOAL #1   Title Patient to demonstrate complete healing and closure of all 3 wounds in order to demonstrate improvement of condition    Time 6   Period Weeks   Status New   PT LONG TERM GOAL #2   Title Patient to be able to state 3 safety precautions for use in her  home in order to prevent falls or accidents that might open additional wounds in order to improve safety at home    Time 6   Period Weeks   Status New             Patient will benefit from skilled therapeutic intervention in order to improve the following deficits and impairments:     Visit Diagnosis: Unspecified open wound, left lower leg, sequela  Pain in left lower leg     Problem List Patient Active Problem List   Diagnosis Date Noted  . Bronchospasm 06/12/2014  . Acute respiratory failure with hypoxia ([place]) 06/12/2014  . Allergic reaction   . Esophageal reflux 08/17/2013  . COPD (chronic obstructive pulmonary disease) ([place]) 08/11/2013  . Acute pyelonephritis 08/03/2013  . Septic shock due to Enterococcus species ([place]) 08/03/2013  . Septicemia ([place]) 08/03/2013  . Gram negative septic shock ([place]) 08/03/2013  . NSTEMI (non-ST elevated myocardial infarction) type 2 07/26/2013  . Dehydration 07/25/2013  . AKI (acute kidney injury) ([place]) 07/25/2013  . Elevated troponin 07/25/2013  . Colitis 07/08/2013  . Nausea and vomiting 07/08/2013  . Abdominal pain, unspecified site 07/08/2013  . Hydronephrosis, left 07/08/2013  . Microscopic colitis 06/27/2013  . Memory loss 04/25/2013  . Depression 04/25/2013  . Loss of weight 04/20/2013  . Melena 04/20/2013  . Diarrhea 04/20/2013  . SAH (subarachnoid hemorrhage) ([place]) 11/22/2012  . Seizures secondary to [facility] 11/22/2012  . Bilateral lower leg cellulitis 11/13/2012  . Open wound of leg 11/13/2012  . Oral thrush 11/13/2012   Ihor Austin, [place]; [place]  Aldona Lento 05/09/2015, 11:29 AM  [place], [place], [address] Phone: [phone]   Fax:  [phone]  Name: BRYONY ROUGIER MRN: [id number] Date of Birth: [birth year]

## 2015-05-13 ENCOUNTER — Ambulatory Visit (HOSPITAL_COMMUNITY): Payer: Medicare Other | Admitting: Physical Therapy

## 2015-05-13 DIAGNOSIS — L98491 Non-pressure chronic ulcer of skin of other sites limited to breakdown of skin: Secondary | ICD-10-CM | POA: Diagnosis not present

## 2015-05-13 DIAGNOSIS — M79662 Pain in left lower leg: Secondary | ICD-10-CM | POA: Diagnosis not present

## 2015-05-13 DIAGNOSIS — S81802S Unspecified open wound, left lower leg, sequela: Secondary | ICD-10-CM

## 2015-05-13 DIAGNOSIS — I959 Hypotension, unspecified: Secondary | ICD-10-CM | POA: Diagnosis not present

## 2015-05-13 NOTE — Therapy (Signed)
[place], [place], [address] Phone: [phone]   Fax:  [phone]  Wound Care Therapy  Patient Details  Name: Bridget Ball MRN: [id number] Date of Birth: [birth year] No Data Recorded  Encounter Date: 05/13/2015      PT End of Session - 05/13/15 1820    Visit Number 3   Number of Visits 12   Date for PT Re-Evaluation 05/27/15   Authorization Type [employer] [facility] complete plan/HMO    Authorization Time Period 05/06/15 to 06/17/15   Authorization - Visit Number 3   Authorization - Number of Visits 10   PT Start Time 1608   PT Stop Time 1640   PT Time Calculation (min) 32 min   Activity Tolerance Patient tolerated treatment well   Behavior During Therapy [facility] for tasks assessed/performed      Past Medical History  Diagnosis Date  . COPD (chronic obstructive pulmonary disease) ([place])   . Arthritis   . Depression   . Memory difficulties   . Chronic diarrhea   . Hiatal hernia   . Cancer [facility])     Skin cancer  . Hyperlipidemia   . Myocardial infarction [facility])     unknown time   . Shortness of breath     with exertion  . Seizures ([place])     x 2 last year due to bleeding to the brain.  . Stroke [facility])     TIA four years ago / stroke NOV 2014  . Bruises easily   . GERD (gastroesophageal reflux disease)   . Microscopic colitis   . Colitis   . Hydronephrosis, left   . Hx of septic shock JULY 2015  . Dementia   . Anxiety   . Hx of pulmonary edema JULY 2015  . Attention to nephrostomy ([place])     PT HAS NEPHROSTOMY TUBE IN PLACE  . Hx of bronchitis   . Hx of pulmonary edema JULY 2015    Past Surgical History  Procedure Laterality Date  . Appendectomy    . Skin cancer excision    . Tubal ligation    . Colonoscopy N/A 05/24/2013    Procedure: COLONOSCOPY;  Surgeon: Rogene Houston, MD;  [facility];  Service: Endoscopy;  Laterality: N/A;  100  . Esophagogastroduodenoscopy N/A 05/24/2013     Procedure: ESOPHAGOGASTRODUODENOSCOPY (EGD);  Surgeon: Rogene Houston, MD;  Location: [facility] ENDO SU[facility];  Service: Endoscopy;  Laterality: N/A;  . Tonsillectomy    . Cystoscopy with retrograde pyelogram, ureteroscopy and stent placement Left 09/18/2013    Procedure: CYSTOSCOPY WITH RETROGRADE PYELOGRAM,  URETEROSCOPY AND  STENT PLACEMENT, removal of nephrostomy tube;  Surgeon: Jorja Loa, MD;  Location: [facility] ORS;  Service: Urology;  Laterality: Left;  . Breast biopsy Right 02/02/2014    Procedure: RIGHT BREAST BIOPSY;  Surgeon: Jamesetta So, MD;  Location: [facility] ORS;  Service: General;  Laterality: Right;  . Cystoscopy with stent placement Left 09/04/2014    Procedure: CYSTOSCOPY WITH LEFT J2 STENT EXCHANGE;  Surgeon: Franchot Gallo, MD;  Location: [facility] ORS;  Service: Urology;  Laterality: Left;    There were no vitals filed for this visit.                  Wound Therapy - 05/13/15 1815    Subjective Daughter states her bandages were coming down at the top.  No pain unless we mess with it.   Patient and Family  Stated Goals wounds to heal    Date of Onset --  month ago   Pain Assessment No/denies pain   Evaluation and Treatment Procedures Explained to Patient/Family Yes   Evaluation and Treatment Procedures agreed to   Wound Properties Date First Assessed: 05/06/15 Wound Type: Other (Comment) Location: Tibial Location Orientation: Left Wound Description (Comments): lateral L tibia  Present on Admission: Yes   Dressing Type Gauze (Comment)  medihoney distally and xeroform most proximal   Dressing Changed Changed   Dressing Status Old drainage   Dressing Change Frequency PRN   Site / Wound Assessment Clean;Dry;Pale   % Wound base Red or Granulating 40%   % Wound base Yellow 55%   % Wound base Black 0%   Peri-wound Assessment Pink   Drainage Amount Minimal   Drainage Description Serosanguineous   Treatment Cleansed;Debridement (Selective)   Selective Debridement -  Location Lt LE multiple wounds   Selective Debridement - Tools Used Forceps;Scalpel;Scissors   Selective Debridement - Tissue Removed Dead skin and slough   Wound Therapy - Clinical Statement Able to debride most slough from perimeter of most distal wound to encourage approximation.  Proximal wound is now 100% granulated without need for debridement.  Tiny medial wound is healed.  Overall responding well to treatment and able to tolerate gentle debridement.  continued with medihoney to most distal wound and xeroform to proximal.  No compression used.  Cleansed entire Rt LE and Moisturized well prior to rebandaging.  Used netting to secure dressing.   Wound Therapy - Functional Problem List open wounds L LE    Factors Delaying/Impairing Wound Healing Other (comment)  dementia   Wound Therapy - Frequency Other (comment)  2x/ week   Wound Therapy - Current Recommendations PT   Wound Plan Continue wound care with appropriate dressings.  No medipore tape to skin due to fragile skin   Dressing  medihoney on sloughy wounds, xeroform, lotion of skin, gauze wrap and netting                   PT Short Term Goals - 05/06/15 1704    PT SHORT TERM GOAL #1   Title Patient will demonstrate slough 0% in all wounds in order to demonstrate improvement of general condition    Time 3   Period Weeks   Status New   PT SHORT TERM GOAL #2   Title Patient to demonstrate full resolution of two smaller wounds in order to demonstrate improvement of general condition    Time 3   Period Weeks   Status New   PT SHORT TERM GOAL #3   Title Patient to demonstrate reduction of size of largest wound by at least 50% in order to demonstrate improvement of condition    Time 3   Period Weeks   Status New           PT Long Term Goals - 05/06/15 1706    PT LONG TERM GOAL #1   Title Patient to demonstrate complete healing and closure of all 3 wounds in order to demonstrate improvement of condition    Time 6    Period Weeks   Status New   PT LONG TERM GOAL #2   Title Patient to be able to state 3 safety precautions for use in her home in order to prevent falls or accidents that might open additional wounds in order to improve safety at home    Time 6   Period Weeks  Status New             Patient will benefit from skilled therapeutic intervention in order to improve the following deficits and impairments:     Visit Diagnosis: Unspecified open wound, left lower leg, sequela  Pain in left lower leg     Problem List Patient Active Problem List   Diagnosis Date Noted  . Bronchospasm 06/12/2014  . Acute respiratory failure with hypoxia ([place]) 06/12/2014  . Allergic reaction   . Esophageal reflux 08/17/2013  . COPD (chronic obstructive pulmonary disease) ([place]) 08/11/2013  . Acute pyelonephritis 08/03/2013  . Septic shock due to Enterococcus species ([place]) 08/03/2013  . Septicemia ([place]) 08/03/2013  . Gram negative septic shock ([place]) 08/03/2013  . NSTEMI (non-ST elevated myocardial infarction) type 2 07/26/2013  . Dehydration 07/25/2013  . AKI (acute kidney injury) ([place]) 07/25/2013  . Elevated troponin 07/25/2013  . Colitis 07/08/2013  . Nausea and vomiting 07/08/2013  . Abdominal pain, unspecified site 07/08/2013  . Hydronephrosis, left 07/08/2013  . Microscopic colitis 06/27/2013  . Memory loss 04/25/2013  . Depression 04/25/2013  . Loss of weight 04/20/2013  . Melena 04/20/2013  . Diarrhea 04/20/2013  . SAH (subarachnoid hemorrhage) ([place]) 11/22/2012  . Seizures secondary to [facility] 11/22/2012  . Bilateral lower leg cellulitis 11/13/2012  . Open wound of leg 11/13/2012  . Oral thrush 11/13/2012    Teena Irani, PTA/CLT [phone]  05/13/2015, 6:21 PM  [place] [address] [place], [place], [address] Phone: [phone]   Fax:  [phone]  Name: Bridget Ball MRN: [id number] Date of Birth: 09/13/1928

## 2015-05-16 ENCOUNTER — Ambulatory Visit (HOSPITAL_COMMUNITY): Payer: Medicare Other | Admitting: Physical Therapy

## 2015-05-16 DIAGNOSIS — S81802S Unspecified open wound, left lower leg, sequela: Secondary | ICD-10-CM | POA: Diagnosis not present

## 2015-05-16 DIAGNOSIS — M79662 Pain in left lower leg: Secondary | ICD-10-CM

## 2015-05-16 NOTE — Therapy (Signed)
[place], [place], [address] Phone: [phone]   Fax:  [phone]  Wound Care Therapy  Patient Details  Name: Bridget Ball MRN: [id number] Date of Birth: 12-31-1928 No Data Recorded  Encounter Date: 05/16/2015      PT End of Session - 05/16/15 1727    Visit Number 4   Number of Visits 12   Date for PT Re-Evaluation 05/27/15   Authorization Type [employer] [facility] complete plan/HMO    Authorization Time Period 05/06/15 to 06/17/15   Authorization - Visit Number 4   Authorization - Number of Visits 10   PT Start Time 1525   PT Stop Time 1600   PT Time Calculation (min) 35 min   Activity Tolerance Patient tolerated treatment well   Behavior During Therapy [facility] for tasks assessed/performed      Past Medical History  Diagnosis Date  . COPD (chronic obstructive pulmonary disease) ([place])   . Arthritis   . Depression   . Memory difficulties   . Chronic diarrhea   . Hiatal hernia   . Cancer [facility])     Skin cancer  . Hyperlipidemia   . Myocardial infarction [facility])     unknown time   . Shortness of breath     with exertion  . Seizures ([place])     x 2 last year due to bleeding to the brain.  . Stroke [facility])     TIA four years ago / stroke NOV 2014  . Bruises easily   . GERD (gastroesophageal reflux disease)   . Microscopic colitis   . Colitis   . Hydronephrosis, left   . Hx of septic shock JULY 2015  . Dementia   . Anxiety   . Hx of pulmonary edema JULY 2015  . Attention to nephrostomy ([place])     PT HAS NEPHROSTOMY TUBE IN PLACE  . Hx of bronchitis   . Hx of pulmonary edema JULY 2015    Past Surgical History  Procedure Laterality Date  . Appendectomy    . Skin cancer excision    . Tubal ligation    . Colonoscopy N/A 05/24/2013    Procedure: COLONOSCOPY;  Surgeon: Rogene Houston, MD;  [facility];  Service: Endoscopy;  Laterality: N/A;  100  . Esophagogastroduodenoscopy N/A 05/24/2013     Procedure: ESOPHAGOGASTRODUODENOSCOPY (EGD);  Surgeon: Rogene Houston, MD;  Location[facility];  Service: Endoscopy;  Laterality: N/A;  . Tonsillectomy    . Cystoscopy with retrograde pyelogram, ureteroscopy and stent placement Left 09/18/2013    Procedure: CYSTOSCOPY WITH RETROGRADE PYELOGRAM,  URETEROSCOPY AND  STENT PLACEMENT, removal of nephrostomy tube;  Surgeon: Jorja Loa, MD;  Location[facility] ORS;  Service: Urology;  Laterality: Left;  . Breast biopsy Right 02/02/2014    Procedure: RIGHT BREAST BIOPSY;  Surgeon: Jamesetta So, MD;  Location: [facility] ORS;  Service: General;  Laterality: Right;  . Cystoscopy with stent placement Left 09/04/2014    Procedure: CYSTOSCOPY WITH LEFT J2 STENT EXCHANGE;  Surgeon: Franchot Gallo, MD;  Location: [facility] ORS;  Service: Urology;  Laterality: Left;    There were no vitals filed for this visit.                  Wound Therapy - 05/16/15 1723    Subjective daughter states she medicated her well to tolerate debridement well.  Dressing slid down and had to fix it.   Patient and  Family Stated Goals wounds to heal    Date of Onset --  month ago   Pain Assessment 0-10   Pain Score 4    Evaluation and Treatment Procedures Explained to Patient/Family Yes   Wound Properties Date First Assessed: 05/06/15 Wound Type: Other (Comment) Location: Tibial Location Orientation: Left Wound Description (Comments): lateral L tibia  Present on Admission: Yes   Dressing Type Gauze (Comment)  medihoney distally and xeroform most proximal   Dressing Changed Changed   Dressing Status Old drainage   Dressing Change Frequency PRN   Site / Wound Assessment Clean;Dry;Pale   % Wound base Red or Granulating 40%   % Wound base Yellow 55%   % Wound base Black 0%   Peri-wound Assessment Pink   Drainage Amount Minimal   Drainage Description Serosanguineous   Treatment Cleansed;Debridement (Selective)   Selective Debridement - Location Lt LE multiple wounds    Selective Debridement - Tools Used Forceps;Scalpel;Scissors   Selective Debridement - Tissue Removed Dead skin and slough   Wound Therapy - Clinical Statement Thick slough covering all wounds today.  Able to debride via sharps and irrigate well.  Changed dressing to xeroform for all due to honey gauze sticking to wound.  Pt reported overall comfort with dressing.  Secured with #6 netting following bandaging.     Wound Therapy - Functional Problem List open wounds L LE    Factors Delaying/Impairing Wound Healing Other (comment)  dementia   Wound Therapy - Frequency Other (comment)  2x/ week   Wound Therapy - Current Recommendations PT   Wound Plan Continue wound care with appropriate dressings.  No medipore tape to skin due to fragile skin   Dressing  xeroform, lotion of skin, gauze wrap and netting                   PT Short Term Goals - 05/06/15 1704    PT SHORT TERM GOAL #1   Title Patient will demonstrate slough 0% in all wounds in order to demonstrate improvement of general condition    Time 3   Period Weeks   Status New   PT SHORT TERM GOAL #2   Title Patient to demonstrate full resolution of two smaller wounds in order to demonstrate improvement of general condition    Time 3   Period Weeks   Status New   PT SHORT TERM GOAL #3   Title Patient to demonstrate reduction of size of largest wound by at least 50% in order to demonstrate improvement of condition    Time 3   Period Weeks   Status New           PT Long Term Goals - 05/06/15 1706    PT LONG TERM GOAL #1   Title Patient to demonstrate complete healing and closure of all 3 wounds in order to demonstrate improvement of condition    Time 6   Period Weeks   Status New   PT LONG TERM GOAL #2   Title Patient to be able to state 3 safety precautions for use in her home in order to prevent falls or accidents that might open additional wounds in order to improve safety at home    Time 6   Period Weeks    Status New             Patient will benefit from skilled therapeutic intervention in order to improve the following deficits and impairments:     Visit Diagnosis: Unspecified  open wound, left lower leg, sequela  Pain in left lower leg     Problem List Patient Active Problem List   Diagnosis Date Noted  . Bronchospasm 06/12/2014  . Acute respiratory failure with hypoxia ([place]) 06/12/2014  . Allergic reaction   . Esophageal reflux 08/17/2013  . COPD (chronic obstructive pulmonary disease) ([place]) 08/11/2013  . Acute pyelonephritis 08/03/2013  . Septic shock due to Enterococcus species ([place]) 08/03/2013  . Septicemia ([place]) 08/03/2013  . Gram negative septic shock ([place]) 08/03/2013  . NSTEMI (non-ST elevated myocardial infarction) type 2 07/26/2013  . Dehydration 07/25/2013  . AKI (acute kidney injury) ([place]) 07/25/2013  . Elevated troponin 07/25/2013  . Colitis 07/08/2013  . Nausea and vomiting 07/08/2013  . Abdominal pain, unspecified site 07/08/2013  . Hydronephrosis, left 07/08/2013  . Microscopic colitis 06/27/2013  . Memory loss 04/25/2013  . Depression 04/25/2013  . Loss of weight 04/20/2013  . Melena 04/20/2013  . Diarrhea 04/20/2013  . SAH (subarachnoid hemorrhage) ([place]) 11/22/2012  . Seizures secondary to [facility] 11/22/2012  . Bilateral lower leg cellulitis 11/13/2012  . Open wound of leg 11/13/2012  . Oral thrush 11/13/2012    Teena Irani, PTA/CLT [phone]  05/16/2015, 5:27 PM  [place] [address] [place], [place], [address] Phone: [phone]   Fax:  [phone]  Name: TAYNA DOREMUS MRN: [id number] Date of Birth: 1928/09/13

## 2015-05-20 ENCOUNTER — Ambulatory Visit (HOSPITAL_COMMUNITY): Payer: Medicare Other | Admitting: Physical Therapy

## 2015-05-20 DIAGNOSIS — M79662 Pain in left lower leg: Secondary | ICD-10-CM

## 2015-05-20 DIAGNOSIS — S81802S Unspecified open wound, left lower leg, sequela: Secondary | ICD-10-CM | POA: Diagnosis not present

## 2015-05-20 NOTE — Therapy (Signed)
[place], [place], [address] Phone: [phone]   Fax:  [phone]  Wound Care Therapy  Patient Details  Name: Bridget Ball MRN: [id number] Date of Birth: [birth year] No Data Recorded  Encounter Date: 05/20/2015      PT End of Session - 05/20/15 1628    Visit Number 5   Number of Visits 12   Date for PT Re-Evaluation 05/27/15   Authorization Type [employer] [facility] complete plan/HMO    Authorization Time Period 05/06/15 to 06/17/15   Authorization - Visit Number 5   Authorization - Number of Visits 10   PT Start Time 1430   PT Stop Time 1508   PT Time Calculation (min) 38 min   Activity Tolerance Patient tolerated treatment well   Behavior During Therapy [facility] for tasks assessed/performed      Past Medical History  Diagnosis Date  . COPD (chronic obstructive pulmonary disease) ([place])   . Arthritis   . Depression   . Memory difficulties   . Chronic diarrhea   . Hiatal hernia   . Cancer [facility])     Skin cancer  . Hyperlipidemia   . Myocardial infarction [facility])     unknown time   . Shortness of breath     with exertion  . Seizures ([place])     x 2 last year due to bleeding to the brain.  . Stroke [facility])     TIA four years ago / stroke NOV 2014  . Bruises easily   . GERD (gastroesophageal reflux disease)   . Microscopic colitis   . Colitis   . Hydronephrosis, left   . Hx of septic shock JULY 2015  . Dementia   . Anxiety   . Hx of pulmonary edema JULY 2015  . Attention to nephrostomy ([place])     PT HAS NEPHROSTOMY TUBE IN PLACE  . Hx of bronchitis   . Hx of pulmonary edema JULY 2015    Past Surgical History  Procedure Laterality Date  . Appendectomy    . Skin cancer excision    . Tubal ligation    . Colonoscopy N/A 05/24/2013    Procedure: COLONOSCOPY;  Surgeon: Rogene Houston, MD;  Location[facility];  Service: Endoscopy;  Laterality: N/A;  100  . Esophagogastroduodenoscopy N/A 05/24/2013   Procedure: ESOPHAGOGASTRODUODENOSCOPY (EGD);  Surgeon: Rogene Houston, MD;  Location: [facility];  Service: Endoscopy;  Laterality: N/A;  . Tonsillectomy    . Cystoscopy with retrograde pyelogram, ureteroscopy and stent placement Left 09/18/2013    Procedure: CYSTOSCOPY WITH RETROGRADE PYELOGRAM,  URETEROSCOPY AND  STENT PLACEMENT, removal of nephrostomy tube;  Surgeon: Jorja Loa, MD;  Location: [facility]S;  Service: Urology;  Laterality: Left;  . Breast biopsy Right 02/02/2014    Procedure: RIGHT BREAST BIOPSY;  Surgeon: Jamesetta So, MD;  Location: [facility] ORS;  Service: General;  Laterality: Right;  . Cystoscopy with stent placement Left 09/04/2014    Procedure: CYSTOSCOPY WITH LEFT J2 STENT EXCHANGE;  Surgeon: Franchot Gallo, MD;  Location: [facility] ORS;  Service: Urology;  Laterality: Left;    There were no vitals filed for this visit.                  Wound Therapy - 05/20/15 1624    Subjective No pertinent complaints, only hurts with debridment.   Patient and Family Stated Goals wounds to heal    Date of Onset --  month ago   Pain Assessment No/denies pain   Wound Properties Date First Assessed: 05/06/15 Wound Type: Other (Comment) Location: Tibial Location Orientation: Left Wound Description (Comments): lateral L tibia  Present on Admission: Yes   Dressing Type Gauze (Comment)  medihoney distally and xeroform most proximal   Dressing Changed Changed   Dressing Status Old drainage   Dressing Change Frequency PRN   Site / Wound Assessment Clean;Dry;Pale   % Wound base Red or Granulating 50%   % Wound base Yellow 50%   % Wound base Black 0%   Peri-wound Assessment Pink   Drainage Amount Minimal   Drainage Description Serosanguineous   Treatment Cleansed;Debridement (Selective)   Selective Debridement - Location Lt LE multiple wounds   Selective Debridement - Tools Used Forceps;Scalpel   Selective Debridement - Tissue Removed Dead skin and slough   Wound Therapy  - Clinical Statement Thick slough covering most distal wound only but easily debrided from borders.  Most proximal wound is looking better with reduced slough.  Pt continues to have poor tolerance to debridement, requiring frequent breaks and comforting from daughter.  Continued with xeroform and completed a figure 8 around knee (with knee bent) to attempt to keep dressings up and covering most proximal wound.  Used #6 netting to secure bandages.    Wound Therapy - Functional Problem List open wounds L LE    Factors Delaying/Impairing Wound Healing Other (comment)  dementia   Wound Therapy - Frequency Other (comment)  2x/ week   Wound Therapy - Current Recommendations PT   Wound Plan Continue wound care with appropriate dressings.  No medipore tape to skin due to fragile skin   Dressing  xeroform, lotion of skin, gauze wrap and netting                   PT Short Term Goals - 05/06/15 1704    PT SHORT TERM GOAL #1   Title Patient will demonstrate slough 0% in all wounds in order to demonstrate improvement of general condition    Time 3   Period Weeks   Status New   PT SHORT TERM GOAL #2   Title Patient to demonstrate full resolution of two smaller wounds in order to demonstrate improvement of general condition    Time 3   Period Weeks   Status New   PT SHORT TERM GOAL #3   Title Patient to demonstrate reduction of size of largest wound by at least 50% in order to demonstrate improvement of condition    Time 3   Period Weeks   Status New           PT Long Term Goals - 05/06/15 1706    PT LONG TERM GOAL #1   Title Patient to demonstrate complete healing and closure of all 3 wounds in order to demonstrate improvement of condition    Time 6   Period Weeks   Status New   PT LONG TERM GOAL #2   Title Patient to be able to state 3 safety precautions for use in her home in order to prevent falls or accidents that might open additional wounds in order to improve safety at  home    Time 6   Period Weeks   Status New             Patient will benefit from skilled therapeutic intervention in order to improve the following deficits and impairments:     Visit Diagnosis: Unspecified open wound, left  lower leg, sequela  Pain in left lower leg     Problem List Patient Active Problem List   Diagnosis Date Noted  . Bronchospasm 06/12/2014  . Acute respiratory failure with hypoxia ([place]) 06/12/2014  . Allergic reaction   . Esophageal reflux 08/17/2013  . COPD (chronic obstructive pulmonary disease) ([place]) 08/11/2013  . Acute pyelonephritis 08/03/2013  . Septic shock due to Enterococcus species ([place]) 08/03/2013  . Septicemia ([place]) 08/03/2013  . Gram negative septic shock ([place]) 08/03/2013  . NSTEMI (non-ST elevated myocardial infarction) type 2 07/26/2013  . Dehydration 07/25/2013  . AKI (acute kidney injury) ([place]) 07/25/2013  . Elevated troponin 07/25/2013  . Colitis 07/08/2013  . Nausea and vomiting 07/08/2013  . Abdominal pain, unspecified site 07/08/2013  . Hydronephrosis, left 07/08/2013  . Microscopic colitis 06/27/2013  . Memory loss 04/25/2013  . Depression 04/25/2013  . Loss of weight 04/20/2013  . Melena 04/20/2013  . Diarrhea 04/20/2013  . SAH (subarachnoid hemorrhage) ([place]) 11/22/2012  . Seizures secondary to [facility] 11/22/2012  . Bilateral lower leg cellulitis 11/13/2012  . Open wound of leg 11/13/2012  . Oral thrush 11/13/2012    Teena Irani, PTA/CLT [phone]  05/20/2015, 4:29 PM  [place] [address] [place], [place], [address] Phone: [phone]   Fax:  [phone]  Name: GWENDY REDHEAD MRN: [id number] Date of Birth: [birth year]

## 2015-05-21 DIAGNOSIS — C44519 Basal cell carcinoma of skin of other part of trunk: Secondary | ICD-10-CM | POA: Diagnosis not present

## 2015-05-21 DIAGNOSIS — D0462 Carcinoma in situ of skin of left upper limb, including shoulder: Secondary | ICD-10-CM | POA: Diagnosis not present

## 2015-05-23 ENCOUNTER — Ambulatory Visit (HOSPITAL_COMMUNITY): Payer: Medicare Other | Admitting: Physical Therapy

## 2015-05-23 DIAGNOSIS — M79662 Pain in left lower leg: Secondary | ICD-10-CM

## 2015-05-23 DIAGNOSIS — S81802S Unspecified open wound, left lower leg, sequela: Secondary | ICD-10-CM

## 2015-05-23 NOTE — Therapy (Signed)
[place], [place], [address] Phone: [phone]   Fax:  [phone]  Wound Care Therapy  Patient Details  Name: Bridget Ball MRN: [id number] Date of Birth: 12/26/1928 No Data Recorded  Encounter Date: 05/23/2015      PT End of Session - 05/23/15 1537    Visit Number 6   Number of Visits 12   Date for PT Re-Evaluation 05/27/15   Authorization Type [employer] A[employer] [facility] complete plan/HMO    Authorization Time Period 05/06/15 to 06/17/15   Authorization - Visit Number 6   Authorization - Number of Visits 10   PT Start Time [id number]   PT Stop Time 1519   PT Time Calculation (min) 44 min   Activity Tolerance Patient tolerated treatment well   Behavior During Therapy [facility] for tasks assessed/performed      Past Medical History  Diagnosis Date  . COPD (chronic obstructive pulmonary disease) ([place])   . Arthritis   . Depression   . Memory difficulties   . Chronic diarrhea   . Hiatal hernia   . Cancer [facility])     Skin cancer  . Hyperlipidemia   . Myocardial infarction [facility])     unknown time   . Shortness of breath     with exertion  . Seizures ([place])     x 2 last year due to bleeding to the brain.  . Stroke [facility])     TIA four years ago / stroke NOV 2014  . Bruises easily   . GERD (gastroesophageal reflux disease)   . Microscopic colitis   . Colitis   . Hydronephrosis, left   . Hx of septic shock JULY 2015  . Dementia   . Anxiety   . Hx of pulmonary edema JULY 2015  . Attention to nephrostomy ([place])     PT HAS NEPHROSTOMY TUBE IN PLACE  . Hx of bronchitis   . Hx of pulmonary edema JULY 2015    Past Surgical History  Procedure Laterality Date  . Appendectomy    . Skin cancer excision    . Tubal ligation    . Colonoscopy N/A 05/24/2013    Procedure: COLONOSCOPY;  Surgeon: Rogene Houston, MD;  [facility]: [facility];  Service: Endoscopy;  Laterality: N/A;  100  . Esophagogastroduodenoscopy N/A 05/24/2013   Procedure: ESOPHAGOGASTRODUODENOSCOPY (EGD);  Surgeon: Rogene Houston, MD;  Location: [facility] ENDO SUITE;  Service: Endoscopy;  Laterality: N/A;  . Tonsillectomy    . Cystoscopy with retrograde pyelogram, ureteroscopy and stent placement Left 09/18/2013    Procedure: CYSTOSCOPY WITH RETROGRADE PYELOGRAM,  URETEROSCOPY AND  STENT PLACEMENT, removal of nephrostomy tube;  Surgeon: Jorja Loa, MD;  Location: [facility] ORS;  Service: Urology;  Laterality: Left;  . Breast biopsy Right 02/02/2014    Procedure: RIGHT BREAST BIOPSY;  Surgeon: Jamesetta So, MD;  Location: [facility] ORS;  Service: General;  Laterality: Right;  . Cystoscopy with stent placement Left 09/04/2014    Procedure: CYSTOSCOPY WITH LEFT J2 STENT EXCHANGE;  Surgeon: Franchot Gallo, MD;  Location: [facility] ORS;  Service: Urology;  Laterality: Left;    There were no vitals filed for this visit.                  Wound Therapy - 05/23/15 1530    Subjective Patient arrives with daughter, who rports that she gave her mother some extra pain medication before PT today, otherwise doing well. Noted quad cane being  used incorrectly and corrected today.    Patient and Family Stated Goals wounds to heal    Date of Onset --  month ago   Pain Assessment No/denies pain   Evaluation and Treatment Procedures Explained to Patient/Family Yes   Evaluation and Treatment Procedures agreed to   Wound Properties Date First Assessed: 05/06/15 Wound Type: Other (Comment) Location: Tibial Location Orientation: Left Wound Description (Comments): lateral L tibia  Present on Admission: Yes   Dressing Type Gauze (Comment)   Dressing Changed Changed   Dressing Status Old drainage   Dressing Change Frequency PRN   Site / Wound Assessment Clean;Dry;Pale   % Wound base Red or Granulating 50%   % Wound base Yellow 50%   % Wound base Black 0%   Peri-wound Assessment Pink   Drainage Amount Minimal   Drainage Description Serosanguineous   Treatment  Cleansed;Debridement (Selective);Other (Comment)  debrid, medihoney, gauze, netting    Selective Debridement - Location Lt LE multiple wounds   Selective Debridement - Tools Used Forceps;Scalpel   Selective Debridement - Tissue Removed Dead skin and slough   Wound Therapy - Clinical Statement Daugher reoprts taht patient had tried to take her dressings off, and the daughter had to replace them; noted that she did use tape on patient's skin, which was very carefuly removed but noted that daughter had put tape over a scab which caused a small skin tear on medial knee today. Debrided and dressed distal wounds with medihoney, and proximal wounds with xeroform today, included skin tear area from tape with xeroform today. Dressed LE with gauze in a combination of spiral and figure 8 wrap today. ADvised daughter to use ace wrap to secure dressings rather than tape.     Wound Therapy - Functional Problem List open wounds L LE    Factors Delaying/Impairing Wound Healing Other (comment)   Wound Therapy - Frequency Other (comment)   Wound Therapy - Current Recommendations PT   Wound Plan Continue wound care with appropriate dressings.  No medipore tape to skin due to fragile skin                 PT Education - 05/23/15 1537    Education provided Yes   Education Details use of ace wrap instead of tape on skin to secure dressings if they should come off at home    Person(s) Educated Patient   Methods Explanation   Comprehension Verbalized understanding          PT Short Term Goals - 05/06/15 1704    PT SHORT TERM GOAL #1   Title Patient will demonstrate slough 0% in all wounds in order to demonstrate improvement of general condition    Time 3   Period Weeks   Status New   PT SHORT TERM GOAL #2   Title Patient to demonstrate full resolution of two smaller wounds in order to demonstrate improvement of general condition    Time 3   Period Weeks   Status New   PT SHORT TERM GOAL #3    Title Patient to demonstrate reduction of size of largest wound by at least 50% in order to demonstrate improvement of condition    Time 3   Period Weeks   Status New           PT Long Term Goals - 05/06/15 1706    PT LONG TERM GOAL #1   Title Patient to demonstrate complete healing and closure of all 3 wounds in order  to demonstrate improvement of condition    Time 6   Period Weeks   Status New   PT LONG TERM GOAL #2   Title Patient to be able to state 3 safety precautions for use in her home in order to prevent falls or accidents that might open additional wounds in order to improve safety at home    Time 6   Period Weeks   Status New             Patient will benefit from skilled therapeutic intervention in order to improve the following deficits and impairments:     Visit Diagnosis: Unspecified open wound, left lower leg, sequela  Pain in left lower leg     Problem List Patient Active Problem List   Diagnosis Date Noted  . Bronchospasm 06/12/2014  . Acute respiratory failure with hypoxia ([place]) 06/12/2014  . Allergic reaction   . Esophageal reflux 08/17/2013  . COPD (chronic obstructive pulmonary disease) ([place]) 08/11/2013  . Acute pyelonephritis 08/03/2013  . Septic shock due to Enterococcus species ([place]) 08/03/2013  . Septicemia ([place]) 08/03/2013  . Gram negative septic shock ([place]) 08/03/2013  . NSTEMI (non-ST elevated myocardial infarction) type 2 07/26/2013  . Dehydration 07/25/2013  . AKI (acute kidney injury) ([place]) 07/25/2013  . Elevated troponin 07/25/2013  . Colitis 07/08/2013  . Nausea and vomiting 07/08/2013  . Abdominal pain, unspecified site 07/08/2013  . Hydronephrosis, left 07/08/2013  . Microscopic colitis 06/27/2013  . Memory loss 04/25/2013  . Depression 04/25/2013  . Loss of weight 04/20/2013  . Melena 04/20/2013  . Diarrhea 04/20/2013  . SAH (subarachnoid hemorrhage) ([place]) 11/22/2012  . Seizures secondary to [facility] 11/22/2012  .  Bilateral lower leg cellulitis 11/13/2012  . Open wound of leg 11/13/2012  . Oral thrush 11/13/2012    Deniece Ree PT, DPT [facility] [address] [place], [place], [address] Phone: [phone]   Fax:  [phone]  Name: AUTUMN MATHIEU MRN: [id number] Date of Birth: [birth year]

## 2015-05-28 ENCOUNTER — Ambulatory Visit (HOSPITAL_COMMUNITY): Payer: Medicare Other | Admitting: Physical Therapy

## 2015-05-28 DIAGNOSIS — M79662 Pain in left lower leg: Secondary | ICD-10-CM

## 2015-05-28 DIAGNOSIS — S81802S Unspecified open wound, left lower leg, sequela: Secondary | ICD-10-CM

## 2015-05-28 NOTE — Therapy (Signed)
[place], [place], [address] Phone: [phone]   Fax:  [phone]  Wound Care Therapy  Patient Details  Name: Bridget Ball MRN: [id number] Date of Birth: [birth year] No Data Recorded  Encounter Date: 05/28/2015      PT End of Session - 05/28/15 1516    Visit Number 7   Number of Visits 12   Date for PT Re-Evaluation 05/27/15   Authorization Type UHC AARP [facility] complete plan/HMO    Authorization Time Period 05/06/15 to 06/17/15   Authorization - Visit Number 7   Authorization - Number of Visits 10   PT Start Time 1350   PT Stop Time 1425   PT Time Calculation (min) 35 min   Activity Tolerance Patient tolerated treatment well   Behavior During Therapy [facility] for tasks assessed/performed      Past Medical History  Diagnosis Date  . COPD (chronic obstructive pulmonary disease) ([place])   . Arthritis   . Depression   . Memory difficulties   . Chronic diarrhea   . Hiatal hernia   . Cancer [facility])     Skin cancer  . Hyperlipidemia   . Myocardial infarction [facility])     unknown time   . Shortness of breath     with exertion  . Seizures ([place])     x 2 last year due to bleeding to the brain.  . Stroke [facility])     TIA four years ago / stroke NOV 2014  . Bruises easily   . GERD (gastroesophageal reflux disease)   . Microscopic colitis   . Colitis   . Hydronephrosis, left   . Hx of septic shock JULY 2015  . Dementia   . Anxiety   . Hx of pulmonary edema JULY 2015  . Attention to nephrostomy ([place])     PT HAS NEPHROSTOMY TUBE IN PLACE  . Hx of bronchitis   . Hx of pulmonary edema JULY 2015    Past Surgical History  Procedure Laterality Date  . Appendectomy    . Skin cancer excision    . Tubal ligation    . Colonoscopy N/A 05/24/2013    Procedure: COLONOSCOPY;  Surgeon: Rogene Houston, MD;  [facility];  Service: Endoscopy;  Laterality: N/A;  100  . Esophagogastroduodenoscopy N/A 05/24/2013   Procedure: ESOPHAGOGASTRODUODENOSCOPY (EGD);  Surgeon: Rogene Houston, MD;  Location[facility];  Service: Endoscopy;  Laterality: N/A;  . Tonsillectomy    . Cystoscopy with retrograde pyelogram, ureteroscopy and stent placement Left 09/18/2013    Procedure: CYSTOSCOPY WITH RETROGRADE PYELOGRAM,  URETEROSCOPY AND  STENT PLACEMENT, removal of nephrostomy tube;  Surgeon: Jorja Loa, MD;  Location: [facility] ORS;  Service: Urology;  Laterality: Left;  . Breast biopsy Right 02/02/2014    Procedure: RIGHT BREAST BIOPSY;  Surgeon: Jamesetta So, MD;  Location: [facility] ORS;  Service: General;  Laterality: Right;  . Cystoscopy with stent placement Left 09/04/2014    Procedure: CYSTOSCOPY WITH LEFT J2 STENT EXCHANGE;  Surgeon: Franchot Gallo, MD;  Location: [facility] ORS;  Service: Urology;  Laterality: Left;    There were no vitals filed for this visit.                  Wound Therapy - 05/28/15 1509    Subjective Patient arrives with daughter.  States she's had 2 pain pills and 1 nerve pill.  No pain currently but increases with debridment  Patient and Family Stated Goals wounds to heal    Date of Onset --  month ago   Pain Assessment No/denies pain   Wound Properties Date First Assessed: 05/06/15 Wound Type: Other (Comment) Location: Tibial Location Orientation: Left Wound Description (Comments): lateral L tibia  Present on Admission: Yes   Dressing Type Gauze (Comment)   Dressing Changed Changed   Dressing Status Old drainage   Dressing Change Frequency PRN   Site / Wound Assessment Clean;Dry;Pale   % Wound base Red or Granulating 60%   % Wound base Yellow 40%   % Wound base Black 0%   Peri-wound Assessment Pink   Wound Length (cm) 4 cm   Wound Width (cm) 3.8 cm   Drainage Amount Minimal   Drainage Description Serosanguineous   Treatment Cleansed;Debridement (Selective)   Selective Debridement - Location Lt LE multiple wounds   Selective Debridement - Tools Used Forceps;Scalpel    Selective Debridement - Tissue Removed Dead skin and slough   Wound Therapy - Clinical Statement Daughter states difficulty keeping dressings on despite what we try.  STates she's having to redress them everyday as they are falling down.  Added coban today to help secure and provide some compression that will also help wounds to heal.  slough less adherent today with abiltiy to remove more in central aspect of wound.  2 smallest wounds with most eschar, however all are healing well.    Wound Therapy - Functional Problem List open wounds L LE    Factors Delaying/Impairing Wound Healing Other (comment)   Wound Therapy - Frequency Other (comment)   Wound Therapy - Current Recommendations PT   Wound Plan Continue wound care with appropriate dressings.  No medipore tape to skin due to fragile skin   Dressing  xeroform, lotion of skin, gauze wrap, coban and netting                   PT Short Term Goals - 05/28/15 1518    PT SHORT TERM GOAL #1   Title Patient will demonstrate slough 0% in all wounds in order to demonstrate improvement of general condition    Time 3   Period Weeks   Status On-going   PT SHORT TERM GOAL #2   Title Patient to demonstrate full resolution of two smaller wounds in order to demonstrate improvement of general condition    Time 3   Period Weeks   Status On-going   PT SHORT TERM GOAL #3   Title Patient to demonstrate reduction of size of largest wound by at least 50% in order to demonstrate improvement of condition    Time 3   Period Weeks   Status On-going           PT Long Term Goals - 05/28/15 1518    PT LONG TERM GOAL #1   Title Patient to demonstrate complete healing and closure of all 3 wounds in order to demonstrate improvement of condition    Time 6   Period Weeks   Status On-going   PT LONG TERM GOAL #2   Title Patient to be able to state 3 safety precautions for use in her home in order to prevent falls or accidents that might open  additional wounds in order to improve safety at home    Time 6   Period Weeks   Status On-going             Patient will benefit from skilled therapeutic intervention in order  to improve the following deficits and impairments:     Visit Diagnosis: Unspecified open wound, left lower leg, sequela  Pain in left lower leg     Problem List Patient Active Problem List   Diagnosis Date Noted  . Bronchospasm 06/12/2014  . Acute respiratory failure with hypoxia ([place]) 06/12/2014  . Allergic reaction   . Esophageal reflux 08/17/2013  . COPD (chronic obstructive pulmonary disease) ([place]) 08/11/2013  . Acute pyelonephritis 08/03/2013  . Septic shock due to Enterococcus species ([place]) 08/03/2013  . Septicemia ([place]) 08/03/2013  . Gram negative septic shock ([place]) 08/03/2013  . NSTEMI (non-ST elevated myocardial infarction) type 2 07/26/2013  . Dehydration 07/25/2013  . AKI (acute kidney injury) ([place]) 07/25/2013  . Elevated troponin 07/25/2013  . Colitis 07/08/2013  . Nausea and vomiting 07/08/2013  . Abdominal pain, unspecified site 07/08/2013  . Hydronephrosis, left 07/08/2013  . Microscopic colitis 06/27/2013  . Memory loss 04/25/2013  . Depression 04/25/2013  . Loss of weight 04/20/2013  . Melena 04/20/2013  . Diarrhea 04/20/2013  . SAH (subarachnoid hemorrhage) ([place]) 11/22/2012  . Seizures secondary to [facility] 11/22/2012  . Bilateral lower leg cellulitis 11/13/2012  . Open wound of leg 11/13/2012  . Oral thrush 11/13/2012    Teena Irani, PTA/CLT [phone]  05/28/2015, 3:18 PM  [place] [address] [place], [place], [address] Phone: [phone]   Fax:  [phone]  Name: Bridget Ball MRN: [id number] Date of Birth: Jan 27, 1928

## 2015-05-30 ENCOUNTER — Ambulatory Visit (HOSPITAL_COMMUNITY): Payer: Medicare Other | Admitting: Physical Therapy

## 2015-05-30 DIAGNOSIS — S81802S Unspecified open wound, left lower leg, sequela: Secondary | ICD-10-CM | POA: Diagnosis not present

## 2015-05-30 DIAGNOSIS — M79662 Pain in left lower leg: Secondary | ICD-10-CM

## 2015-05-30 NOTE — Therapy (Signed)
[place], [place], [address] Phone: [phone]   Fax:  [phone]  Wound Care Therapy  Patient Details  Name: Bridget Ball MRN: [id number] Date of Birth: 1928-07-19 No Data Recorded  Encounter Date: 05/30/2015      PT End of Session - 05/30/15 1515    Visit Number 8   Number of Visits 12   Date for PT Re-Evaluation 05/27/15   Authorization Type [employer]HC A[employer] [facility] complete plan/HMO    Authorization Time Period 05/06/15 to 06/17/15   Authorization - Visit Number 8   Authorization - Number of Visits 10   PT Start Time 1430   PT Stop Time 1510   PT Time Calculation (min) 40 min      Past Medical History  Diagnosis Date  . COPD (chronic obstructive pulmonary disease) ([place])   . Arthritis   . Depression   . Memory difficulties   . Chronic diarrhea   . Hiatal hernia   . Cancer [facility])     Skin cancer  . Hyperlipidemia   . Myocardial infarction [facility])     unknown time   . Shortness of breath     with exertion  . Seizures ([place])     x 2 last year due to bleeding to the brain.  . Stroke [facility])     TIA four years ago / stroke NOV 2014  . Bruises easily   . GERD (gastroesophageal reflux disease)   . Microscopic colitis   . Colitis   . Hydronephrosis, left   . Hx of septic shock JULY 2015  . Dementia   . Anxiety   . Hx of pulmonary edema JULY 2015  . Attention to nephrostomy ([place])     PT HAS NEPHROSTOMY TUBE IN PLACE  . Hx of bronchitis   . Hx of pulmonary edema JULY 2015    Past Surgical History  Procedure Laterality Date  . Appendectomy    . Skin cancer excision    . Tubal ligation    . Colonoscopy N/A 05/24/2013    Procedure: COLONOSCOPY;  Surgeon: Rogene Houston, MD;  Location[facility];  Service: Endoscopy;  Laterality: N/A;  100  . Esophagogastroduodenoscopy N/A 05/24/2013    Procedure: ESOPHAGOGASTRODUODENOSCOPY (EGD);  Surgeon: Rogene Houston, MD;  Location: [facility] ENDO SU[facility];  Service:  Endoscopy;  Laterality: N/A;  . Tonsillectomy    . Cystoscopy with retrograde pyelogram, ureteroscopy and stent placement Left 09/18/2013    Procedure: CYSTOSCOPY WITH RETROGRADE PYELOGRAM,  URETEROSCOPY AND  STENT PLACEMENT, removal of nephrostomy tube;  Surgeon: Jorja Loa, MD;  Location: [facility] ORS;  Service: Urology;  Laterality: Left;  . Breast biopsy Right 02/02/2014    Procedure: RIGHT BREAST BIOPSY;  Surgeon: Jamesetta So, MD;  Location: [facility] ORS;  Service: General;  Laterality: Right;  . Cystoscopy with stent placement Left 09/04/2014    Procedure: CYSTOSCOPY WITH LEFT J2 STENT EXCHANGE;  Surgeon: Franchot Gallo, MD;  Location: [facility] ORS;  Service: Urology;  Laterality: Left;    There were no vitals filed for this visit.                  Wound Therapy - 05/30/15 1510    Subjective Pt states she has premedicated so hopefully we can work on her wounds longer    Patient and Family Stated Goals wounds to heal    Date of Onset --  month ago   Pain Assessment No/denies  pain  with debridement pt states pain increased to a 9/10   Wound Properties Date First Assessed: 05/06/15 Wound Type: Other (Comment) Location: Tibial Location Orientation: Left Wound Description (Comments): lateral L tibia  Present on Admission: Yes   Dressing Type Gauze (Comment);Compression wrap  honey to wounds vaseline to LE; compression with coban   Dressing Changed Changed   Dressing Status Old drainage   Dressing Change Frequency PRN   Site / Wound Assessment Clean;Dry;Pale   % Wound base Red or Granulating 50%   % Wound base Yellow 50%   % Wound base Black 0%   Peri-wound Assessment Pink   Drainage Amount Minimal   Drainage Description Serosanguineous   Treatment Cleansed;Debridement (Selective)   Selective Debridement - Location Lt LE multiple wounds   Selective Debridement - Tools Used Forceps;Scalpel   Selective Debridement - Tissue Removed Dead skin and slough   Wound Therapy -  Clinical Statement Dressing was better but still fell down.  Daughter request that dressing to above knee to thigh.  Small anterior wound on leg debrided with noted significant depth, ( at least .5 cm).  Other wounds have minimal depth.    Wound Therapy - Functional Problem List open wounds L LE    Factors Delaying/Impairing Wound Healing Other (comment)   Wound Therapy - Frequency Other (comment)   Wound Therapy - Current Recommendations PT   Wound Plan Continue wound care with appropriate dressings.  No medipore tape to skin due to fragile skin   Dressing  honey to lower wounds xeroform to upper wound.                    PT Short Term Goals - 05/28/15 1518    PT SHORT TERM GOAL #1   Title Patient will demonstrate slough 0% in all wounds in order to demonstrate improvement of general condition    Time 3   Period Weeks   Status On-going   PT SHORT TERM GOAL #2   Title Patient to demonstrate full resolution of two smaller wounds in order to demonstrate improvement of general condition    Time 3   Period Weeks   Status On-going   PT SHORT TERM GOAL #3   Title Patient to demonstrate reduction of size of largest wound by at least 50% in order to demonstrate improvement of condition    Time 3   Period Weeks   Status On-going           PT Long Term Goals - 05/28/15 1518    PT LONG TERM GOAL #1   Title Patient to demonstrate complete healing and closure of all 3 wounds in order to demonstrate improvement of condition    Time 6   Period Weeks   Status On-going   PT LONG TERM GOAL #2   Title Patient to be able to state 3 safety precautions for use in her home in order to prevent falls or accidents that might open additional wounds in order to improve safety at home    Time 6   Period Weeks   Status On-going             Patient will benefit from skilled therapeutic intervention in order to improve the following deficits and impairments:     Visit  Diagnosis: Unspecified open wound, left lower leg, sequela  Pain in left lower leg     Problem List Patient Active Problem List   Diagnosis Date Noted  . Bronchospasm 06/12/2014  .  Acute respiratory failure with hypoxia ([place]) 06/12/2014  . Allergic reaction   . Esophageal reflux 08/17/2013  . COPD (chronic obstructive pulmonary disease) ([place]) 08/11/2013  . Acute pyelonephritis 08/03/2013  . Septic shock due to Enterococcus species ([place]) 08/03/2013  . Septicemia ([place]) 08/03/2013  . Gram negative septic shock ([place]) 08/03/2013  . NSTEMI (non-ST elevated myocardial infarction) type 2 07/26/2013  . Dehydration 07/25/2013  . AKI (acute kidney injury) ([place]) 07/25/2013  . Elevated troponin 07/25/2013  . Colitis 07/08/2013  . Nausea and vomiting 07/08/2013  . Abdominal pain, unspecified site 07/08/2013  . Hydronephrosis, left 07/08/2013  . Microscopic colitis 06/27/2013  . Memory loss 04/25/2013  . Depression 04/25/2013  . Loss of weight 04/20/2013  . Melena 04/20/2013  . Diarrhea 04/20/2013  . SAH (subarachnoid hemorrhage) ([place]) 11/22/2012  . Seizures secondary to [facility] 11/22/2012  . Bilateral lower leg cellulitis 11/13/2012  . Open wound of leg 11/13/2012  . Oral thrush 11/13/2012   Rayetta Humphrey, PT CLT [phone] 05/30/2015, 3:16 PM  [place] [address] [place], [place], [address] Phone: [phone]   Fax:  [phone]  Name: SHAWNAY EAKEN MRN: [id number] Date of Birth: Jul 21, 1928

## 2015-06-04 ENCOUNTER — Ambulatory Visit (HOSPITAL_COMMUNITY): Payer: Medicare Other

## 2015-06-04 DIAGNOSIS — S81802S Unspecified open wound, left lower leg, sequela: Secondary | ICD-10-CM | POA: Diagnosis not present

## 2015-06-04 DIAGNOSIS — M79662 Pain in left lower leg: Secondary | ICD-10-CM

## 2015-06-04 NOTE — Therapy (Signed)
[place], [place], [address] Phone: [phone]   Fax:  [phone]  Wound Care Therapy (Re-Assessment)  Patient Details  Name: Bridget Ball MRN: [id number] Date of Birth: [birth year] No Data Recorded  Encounter Date: 06/04/2015      PT End of Session - 06/04/15 1452    Visit Number 9   Number of Visits 12   Date for PT Re-Evaluation [id number]  Cert ends [id number]; new one done 06/05/15   Authorization Type [employer] [facility] complete plan/HMO    Authorization Time Period 05/06/15 to 06/17/15   Authorization - Visit Number 9   Authorization - Number of Visits 10   PT Start Time [id number]   PT Stop Time 1433   PT Time Calculation (min) 45 min   Activity Tolerance Patient tolerated treatment well   Behavior During Therapy [facility] for tasks assessed/performed      Past Medical History  Diagnosis Date  . COPD (chronic obstructive pulmonary disease) ([place])   . Arthritis   . Depression   . Memory difficulties   . Chronic diarrhea   . Hiatal hernia   . Cancer [facility])     Skin cancer  . Hyperlipidemia   . Myocardial infarction [facility])     unknown time   . Shortness of breath     with exertion  . Seizures ([place])     x 2 last year due to bleeding to the brain.  . Stroke [facility])     TIA four years ago / stroke NOV 2014  . Bruises easily   . GERD (gastroesophageal reflux disease)   . Microscopic colitis   . Colitis   . Hydronephrosis, left   . Hx of septic shock JULY 2015  . Dementia   . Anxiety   . Hx of pulmonary edema JULY 2015  . Attention to nephrostomy ([place])     PT HAS NEPHROSTOMY TUBE IN PLACE  . Hx of bronchitis   . Hx of pulmonary edema JULY 2015    Past Surgical History  Procedure Laterality Date  . Appendectomy    . Skin cancer excision    . Tubal ligation    . Colonoscopy N/A 05/24/2013    Procedure: COLONOSCOPY;  Surgeon: Rogene Houston, MD;  [facility];  Service: Endoscopy;  Laterality:  N/A;  100  . Esophagogastroduodenoscopy N/A 05/24/2013    Procedure: ESOPHAGOGASTRODUODENOSCOPY (EGD);  Surgeon: Rogene Houston, MD;  Location: [facility] END[facility];  Service: Endoscopy;  Laterality: N/A;  . Tonsillectomy    . Cystoscopy with retrograde pyelogram, ureteroscopy and stent placement Left 09/18/2013    Procedure: CYSTOSCOPY WITH RETROGRADE PYELOGRAM,  URETEROSCOPY AND  STENT PLACEMENT, removal of nephrostomy tube;  Surgeon: Jorja Loa, MD;  Location: [facility] ORS;  Service: Urology;  Laterality: Left;  . Breast biopsy Right 02/02/2014    Procedure: RIGHT BREAST BIOPSY;  Surgeon: Jamesetta So, MD;  Location: [facility] ORS;  Service: General;  Laterality: Right;  . Cystoscopy with stent placement Left 09/04/2014    Procedure: CYSTOSCOPY WITH LEFT J2 STENT EXCHANGE;  Surgeon: Franchot Gallo, MD;  Location: [facility] ORS;  Service: Urology;  Laterality: Left;    There were no vitals filed for this visit.       Subjective Assessment - 06/04/15 1454    Subjective Daughter stated dressings are intact today though have slid down over knee, benefits with compression wrapping, did have to loosen up the top of the  dressings for comfort   Currently in Pain? No/denies             Wound Therapy - 06/04/15 1855    Subjective Daughter stated dressings are intact today though have slid down over knee, benefits with compression wrapping, did have to loosen up the top of the dressings for comfort   Patient and Family Stated Goals wounds to heal    Date of Onset --  month ago   Pain Assessment No/denies pain  increased pain to 6/10 with debridement   Wound Properties Date First Assessed: 05/06/15 Wound Type: Other (Comment) Location: Tibial Location Orientation: Left Wound Description (Comments): lateral L tibia  Present on Admission: Yes   Dressing Type Gauze (Comment);Compression wrap  honey to lower wounds xeroform to upper wound.    Dressing Changed Changed   Dressing Status Old drainage    Dressing Change Frequency PRN   Site / Wound Assessment Clean;Dry;Pale   % Wound base Red or Granulating 60%   % Wound base Yellow 40%   % Wound base Black 0%   Peri-wound Assessment Pink   Wound Length (cm) --  Proximal: L1.4xW1.5; Mid:L.8xW.6; Distal: L4.6xW2.9   Wound Width (cm) --  Proximal: L1.4xW1.5; Mid:L.8xW.6; Distal: L4.6xW2.9   Wound Depth (cm) 0 cm   Tunneling (cm) 0   Drainage Amount Minimal   Drainage Description Serosanguineous   Treatment Cleansed;Debridement (Selective)   Selective Debridement - Location Lt LE multiple wounds   Selective Debridement - Tools Used Forceps;Scalpel   Selective Debridement - Tissue Removed Dead skin and slough   Wound Therapy - Clinical Statement Positive results with compression dressings as pt. entered dept with original dressings intact, did slide down though all wounds covered.  LE cleansed, moisterized and selective debridement complete to remove slough and dry/dead skin perimeter of wound.  Wounds are improving in overall reduction of size and improved granulation tissues.  Continued with honey on slough and xeroform on more proximal wounds iwht 100% granulation.  Continues with compression dressings to assist with dressings to be intact.  Pt did report increased pain with debridement though resolved at EOS.     Wound Therapy - Functional Problem List open wounds L LE    Factors Delaying/Impairing Wound Healing Other (comment)  dementia   Wound Therapy - Frequency Other (comment)  2x/week   Wound Therapy - Current Recommendations PT   Wound Plan Continue wound care with appropriate dressings.  No medipore tape to skin due to fragile skin   Dressing  honey to lower wounds xeroform to upper wound.             PT Short Term Goals - 05/28/15 1518    PT SHORT TERM GOAL #1   Title Patient will demonstrate slough 0% in all wounds in order to demonstrate improvement of general condition    Time 3   Period Weeks   Status On-going   PT  SHORT TERM GOAL #2   Title Patient to demonstrate full resolution of two smaller wounds in order to demonstrate improvement of general condition    Time 3   Period Weeks   Status On-going   PT SHORT TERM GOAL #3   Title Patient to demonstrate reduction of size of largest wound by at least 50% in order to demonstrate improvement of condition    Time 3   Period Weeks   Status On-going           PT Long Term Goals - 05/28/15 [id number]  PT LONG TERM GOAL #1   Title Patient to demonstrate complete healing and closure of all 3 wounds in order to demonstrate improvement of condition    Time 6   Period Weeks   Status On-going   PT LONG TERM GOAL #2   Title Patient to be able to state 3 safety precautions for use in her home in order to prevent falls or accidents that might open additional wounds in order to improve safety at home    Time 6   Period Weeks   Status On-going             Patient will benefit from skilled therapeutic intervention in order to improve the following deficits and impairments:     Visit Diagnosis: Unspecified open wound, left lower leg, sequela  Pain in left lower leg   Problem [place] Patient Active Problem List   Diagnosis Date Noted  . Bronchospasm 06/12/2014  . Acute respiratory failure with hypoxia ([place]) 06/12/2014  . Allergic reaction   . Esophageal reflux 08/17/2013  . COPD (chronic obstructive pulmonary disease) ([place]) 08/11/2013  . Acute pyelonephritis 08/03/2013  . Septic shock due to Enterococcus species ([place]) 08/03/2013  . Septicemia ([place]) 08/03/2013  . Gram negative septic shock ([place]) 08/03/2013  . NSTEMI (non-ST elevated myocardial infarction) type 2 07/26/2013  . Dehydration 07/25/2013  . AKI (acute kidney injury) ([place]) 07/25/2013  . Elevated troponin 07/25/2013  . Colitis 07/08/2013  . Nausea and vomiting 07/08/2013  . Abdominal pain, unspecified site 07/08/2013  . Hydronephrosis, left 07/08/2013  . Microscopic colitis  06/27/2013  . Memory loss 04/25/2013  . Depression 04/25/2013  . Loss of weight 04/20/2013  . Melena 04/20/2013  . Diarrhea 04/20/2013  . SAH (subarachnoid hemorrhage) ([place]) 11/22/2012  . Seizures secondary to [facility] 11/22/2012  . Bilateral lower leg cellulitis 11/13/2012  . Open wound of leg 11/13/2012  . Oral thrush 11/13/2012   Ihor Austin, LPTA; CBIS [phone]      G-Codes - 06/13/15 0807    Functional Assessment Tool Used Based on skilled clinical assessment of slough, wound healing status, pain    Functional Limitation Other PT primary   Other PT Primary Current Status [phone]) At least 40 percent but less than 60 percent impaired, limited or restricted   Other PT Primary Goal Status [id number]) At least 20 percent but less than 40 percent impaired, limited or restricted      Deniece Ree PT, DPT [place], [place], [address] Phone: [phone]   Fax:  [phone]  Name: Bridget Ball MRN: [id number] Date of Birth: Sep 14, 1928

## 2015-06-05 DIAGNOSIS — S81802S Unspecified open wound, left lower leg, sequela: Secondary | ICD-10-CM | POA: Diagnosis not present

## 2015-06-05 DIAGNOSIS — M79662 Pain in left lower leg: Secondary | ICD-10-CM | POA: Diagnosis not present

## 2015-06-06 ENCOUNTER — Ambulatory Visit (HOSPITAL_COMMUNITY): Payer: Medicare Other | Attending: Internal Medicine | Admitting: Physical Therapy

## 2015-06-06 DIAGNOSIS — M79662 Pain in left lower leg: Secondary | ICD-10-CM | POA: Insufficient documentation

## 2015-06-06 DIAGNOSIS — S81802S Unspecified open wound, left lower leg, sequela: Secondary | ICD-10-CM | POA: Diagnosis not present

## 2015-06-06 NOTE — Therapy (Signed)
[place], [place], [address] Phone: [phone]   Fax:  [phone]  Wound Care Therapy  Patient Details  Name: Bridget Ball MRN: [id number] Date of Birth: May 31, 1928 No Data Recorded  Encounter Date: 06/06/2015      PT End of Session - 06/06/15 1547    Visit Number 10   Number of Visits 18   Date for PT Re-Evaluation [id number]  Cert ends [id number]   Authorization Type [place] [facility] complete plan/HMO    Authorization Time Period 06/04/15-07/05/15.  Gcode completed on visit 9   Authorization - Visit Number 10   Authorization - Number of Visits 19   PT Start Time [id number]   PT Stop Time 1440   PT Time Calculation (min) 45 min   Activity Tolerance Patient tolerated treatment well   Behavior During Therapy WFL for tasks assessed/performed      Past Medical History  Diagnosis Date  . COPD (chronic obstructive pulmonary disease) ([place])   . Arthritis   . Depression   . Memory difficulties   . Chronic diarrhea   . Hiatal hernia   . Cancer [facility])     Skin cancer  . Hyperlipidemia   . Myocardial infarction [facility])     unknown time   . Shortness of breath     with exertion  . Seizures ([place])     x 2 last year due to bleeding to the brain.  . Stroke [facility])     TIA four years ago / stroke NOV 2014  . Bruises easily   . GERD (gastroesophageal reflux disease)   . Microscopic colitis   . Colitis   . Hydronephrosis, left   . Hx of septic shock JULY 2015  . Dementia   . Anxiety   . Hx of pulmonary edema JULY 2015  . Attention to nephrostomy ([place])     PT HAS NEPHROSTOMY TUBE IN PLACE  . Hx of bronchitis   . Hx of pulmonary edema JULY 2015    Past Surgical History  Procedure Laterality Date  . Appendectomy    . Skin cancer excision    . Tubal ligation    . Colonoscopy N/A 05/24/2013    Procedure: COLONOSCOPY;  Surgeon: Rogene Houston, MD;  [facility];  Service: Endoscopy;  Laterality: N/A;  100  .  Esophagogastroduodenoscopy N/A 05/24/2013    Procedure: ESOPHAGOGASTRODUODENOSCOPY (EGD);  Surgeon: Rogene Houston, MD;  [facility];  Service: Endoscopy;  Laterality: N/A;  . Tonsillectomy    . Cystoscopy with retrograde pyelogram, ureteroscopy and stent placement Left 09/18/2013    Procedure: CYSTOSCOPY WITH RETROGRADE PYELOGRAM,  URETEROSCOPY AND  STENT PLACEMENT, removal of nephrostomy tube;  Surgeon: Jorja Loa, MD;  Location[facility]S;  Service: Urology;  Laterality: Left;  . Breast biopsy Right 02/02/2014    Procedure: RIGHT BREAST BIOPSY;  Surgeon: Jamesetta So, MD;  Location: [facility] ORS;  Service: General;  Laterality: Right;  . Cystoscopy with stent placement Left 09/04/2014    Procedure: CYSTOSCOPY WITH LEFT J2 STENT EXCHANGE;  Surgeon: Franchot Gallo, MD;  Location: [facility] ORS;  Service: Urology;  Laterality: Left;    There were no vitals filed for this visit.                  Wound Therapy - 06/06/15 1545    Subjective Daughter states dressing slid down. No pain.   Patient and Family Stated Goals wounds  to heal    Date of Onset --  month ago   Pain Assessment No/denies pain   Wound Properties Date First Assessed: 05/06/15 Wound Type: Other (Comment) Location: Tibial Location Orientation: Left Wound Description (Comments): lateral L tibia  Present on Admission: Yes   Dressing Type Gauze (Comment);Compression wrap  xeroform used to all wounds   Dressing Changed Changed   Dressing Status Old drainage   Dressing Change Frequency PRN   Site / Wound Assessment Clean;Dry;Pale   % Wound base Red or Granulating 75%   % Wound base Yellow 25%   % Wound base Black 0%   Peri-wound Assessment Pink   Drainage Amount Minimal   Drainage Description Serosanguineous   Treatment Cleansed;Debridement (Selective)   Selective Debridement - Location Lt LE multiple wounds   Selective Debridement - Tools Used Forceps;Scalpel   Selective Debridement - Tissue Removed  Dead skin and slough   Wound Therapy - Clinical Statement Drastic improvement in wound size and granlulation.  continued with compression dressings, however stopped these at knee and used a small piece of medipore to secure most superior wound.  LE moisterized and selective debridement complete to remove slough and dry/dead skin perimeter of wound.  Used xeroform on more all wounds due to increased granulation.  Pt did report increased pain with debridement though resolved at EOS.     Wound Therapy - Functional Problem List open wounds L LE    Factors Delaying/Impairing Wound Healing Other (comment)  dementia   Wound Therapy - Frequency Other (comment)  2x/week   Wound Therapy - Current Recommendations PT   Wound Plan Continue wound care with appropriate dressings.  Minimal use of medipore tape to skin due to fragile skin   Dressing  xeroform, kerlix and coban.                     PT Short Term Goals - 05/28/15 1518    PT SHORT TERM GOAL #1   Title Patient will demonstrate slough 0% in all wounds in order to demonstrate improvement of general condition    Time 3   Period Weeks   Status On-going   PT SHORT TERM GOAL #2   Title Patient to demonstrate full resolution of two smaller wounds in order to demonstrate improvement of general condition    Time 3   Period Weeks   Status On-going   PT SHORT TERM GOAL #3   Title Patient to demonstrate reduction of size of largest wound by at least 50% in order to demonstrate improvement of condition    Time 3   Period Weeks   Status On-going           PT Long Term Goals - 05/28/15 1518    PT LONG TERM GOAL #1   Title Patient to demonstrate complete healing and closure of all 3 wounds in order to demonstrate improvement of condition    Time 6   Period Weeks   Status On-going   PT LONG TERM GOAL #2   Title Patient to be able to state 3 safety precautions for use in her home in order to prevent falls or accidents that might open  additional wounds in order to improve safety at home    Time 6   Period Weeks   Status On-going             Patient will benefit from skilled therapeutic intervention in order to improve the following deficits and impairments:  Visit Diagnosis: Unspecified open wound, left lower leg, sequela  Pain in left lower leg      G-Codes - Jun 07, 2015 0807    Functional Assessment Tool Used Based on skilled clinical assessment of slough, wound healing status, pain    Functional Limitation Other PT primary   Other PT Primary Current Status [id number]) At least 40 percent but less than 60 percent impaired, limited or restricted   Other PT Primary Goal Status [id number]) At least 20 percent but less than 40 percent impaired, limited or restricted       Problem List Patient Active Problem List   Diagnosis Date Noted  . Bronchospasm 06/12/2014  . Acute respiratory failure with hypoxia ([place]) 06/12/2014  . Allergic reaction   . Esophageal reflux 08/17/2013  . COPD (chronic obstructive pulmonary disease) ([employer]) 08/11/2013  . Acute pyelonephritis 08/03/2013  . Septic shock due to Enterococcus species ([place]) 08/03/2013  . Septicemia ([place]) 08/03/2013  . Gram negative septic shock ([place]) 08/03/2013  . NSTEMI (non-ST elevated myocardial infarction) type 2 07/26/2013  . Dehydration 07/25/2013  . AKI (acute kidney injury) ([place]) 07/25/2013  . Elevated troponin 07/25/2013  . Colitis 07/08/2013  . Nausea and vomiting 07/08/2013  . Abdominal pain, unspecified site 07/08/2013  . Hydronephrosis, left 07/08/2013  . Microscopic colitis 06/27/2013  . Memory loss 04/25/2013  . Depression 04/25/2013  . Loss of weight 04/20/2013  . Melena 04/20/2013  . Diarrhea 04/20/2013  . SAH (subarachnoid hemorrhage) ([place]) 11/22/2012  . Seizures secondary to [facility] 11/22/2012  . Bilateral lower leg cellulitis 11/13/2012  . Open wound of leg 11/13/2012  . Oral thrush 11/13/2012    Teena Irani,  PTA/CLT [id number]  06/06/2015, 3:50 PM  [place] [address] [place], [place], [address] Phone: [phone]   Fax:  [phone]  Name: Bridget Ball MRN: [id number] Date of Birth: 1928/04/20

## 2015-06-07 ENCOUNTER — Other Ambulatory Visit: Payer: Self-pay | Admitting: Neurology

## 2015-06-11 ENCOUNTER — Ambulatory Visit (HOSPITAL_COMMUNITY): Payer: Medicare Other | Admitting: Physical Therapy

## 2015-06-11 ENCOUNTER — Other Ambulatory Visit: Payer: Self-pay

## 2015-06-11 ENCOUNTER — Ambulatory Visit (HOSPITAL_COMMUNITY): Payer: Medicare Other

## 2015-06-11 DIAGNOSIS — S81802S Unspecified open wound, left lower leg, sequela: Secondary | ICD-10-CM | POA: Diagnosis not present

## 2015-06-11 DIAGNOSIS — R339 Retention of urine, unspecified: Secondary | ICD-10-CM | POA: Diagnosis not present

## 2015-06-11 DIAGNOSIS — R338 Other retention of urine: Secondary | ICD-10-CM | POA: Diagnosis not present

## 2015-06-11 DIAGNOSIS — N1330 Unspecified hydronephrosis: Secondary | ICD-10-CM | POA: Diagnosis not present

## 2015-06-11 DIAGNOSIS — M79662 Pain in left lower leg: Secondary | ICD-10-CM | POA: Diagnosis not present

## 2015-06-11 MED ORDER — CITALOPRAM HYDROBROMIDE 10 MG PO TABS: ORAL_TABLET | ORAL | Status: DC

## 2015-06-11 NOTE — Therapy (Signed)
[place], [place], [address] Phone: [phone]   Fax:  [phone]  Wound Care Therapy  Patient Details  Name: Bridget Ball MRN: [id number] Date of Birth: 1928/03/04 No Data Recorded  Encounter Date: 06/11/2015      PT End of Session - 06/11/15 0947    Visit Number 11   Number of Visits 18   Date for PT Re-Evaluation [id number]  Cert ends [id number]   Authorization Type [facility] complete plan/HMO    Authorization Time Period 06/04/15-07/05/15.  Gcode completed on visit 9   Authorization - Visit Number 11   Authorization - Number of Visits 19   PT Start Time [phone]   PT Stop Time 0934   PT Time Calculation (min) 36 min   Activity Tolerance Patient tolerated treatment well   Behavior During Therapy [facility] for tasks assessed/performed      Past Medical History  Diagnosis Date  . COPD (chronic obstructive pulmonary disease) ([place])   . Arthritis   . Depression   . Memory difficulties   . Chronic diarrhea   . Hiatal hernia   . Cancer [facility])     Skin cancer  . Hyperlipidemia   . Myocardial infarction [facility])     unknown time   . Shortness of breath     with exertion  . Seizures ([place])     x 2 last year due to bleeding to the brain.  . Stroke [facility])     TIA four years ago / stroke NOV 2014  . Bruises easily   . GERD (gastroesophageal reflux disease)   . Microscopic colitis   . Colitis   . Hydronephrosis, left   . Hx of septic shock JULY 2015  . Dementia   . Anxiety   . Hx of pulmonary edema JULY 2015  . Attention to nephrostomy ([place])     PT HAS NEPHROSTOMY TUBE IN PLACE  . Hx of bronchitis   . Hx of pulmonary edema JULY 2015    Past Surgical History  Procedure Laterality Date  . Appendectomy    . Skin cancer excision    . Tubal ligation    . Colonoscopy N/A 05/24/2013    Procedure: COLONOSCOPY;  Surgeon: Rogene Houston, MD;  [facility] END[facility];  Service: Endoscopy;  Laterality: N/A;  100  .  Esophagogastroduodenoscopy N/A 05/24/2013    Procedure: ESOPHAGOGASTRODUODENOSCOPY (EGD);  Surgeon: Rogene Houston, MD;  Location: [facility] ENDO SUITE;  Service: Endoscopy;  Laterality: N/A;  . Tonsillectomy    . Cystoscopy with retrograde pyelogram, ureteroscopy and stent placement Left 09/18/2013    Procedure: CYSTOSCOPY WITH RETROGRADE PYELOGRAM,  URETEROSCOPY AND  STENT PLACEMENT, removal of nephrostomy tube;  Surgeon: Jorja Loa, MD;  Location: [facility] ORS;  Service: Urology;  Laterality: Left;  . Breast biopsy Right 02/02/2014    Procedure: RIGHT BREAST BIOPSY;  Surgeon: Jamesetta So, MD;  Location: [facility] ORS;  Service: General;  Laterality: Right;  . Cystoscopy with stent placement Left 09/04/2014    Procedure: CYSTOSCOPY WITH LEFT J2 STENT EXCHANGE;  Surgeon: Franchot Gallo, MD;  Location: [facility] ORS;  Service: Urology;  Laterality: Left;    There were no vitals filed for this visit.       Subjective Assessment - 06/11/15 0937    Subjective Daughter reported mother removed tape and started to unpill coban dressings, daughter added dressings to top.  No reports of pain today   Currently in  Pain? No/denies                   Wound Therapy - 06/11/15 0938    Subjective Daughter reported mother removed tape and started to unpill coban dressings, daughter added dressings to top.  No reports of pain today   Patient and Family Stated Goals wounds to heal    Date of Onset --  months ago   Pain Assessment No/denies pain   Wound Properties Date First Assessed: 05/06/15 Wound Type: Other (Comment) Location: Tibial Location Orientation: Left Wound Description (Comments): lateral L tibia  Present on Admission: Yes   Dressing Type Gauze (Comment);Impregnated gauze (petrolatum);Compression wrap  xeroform, vaseline, 4x4 with kerlix and coban with netting   Dressing Changed Changed   Dressing Status Old drainage   Dressing Change Frequency PRN   Site / Wound Assessment Clean;Dry;Pale   %  Wound base Red or Granulating 85%   % Wound base Yellow 15%   % Wound base Black 0%   Peri-wound Assessment Pink   Drainage Amount Minimal   Drainage Description Serosanguineous   Treatment Cleansed;Debridement (Selective)   Selective Debridement - Location Lt LE multiple wounds   Selective Debridement - Tools Used Forceps;Scalpel   Selective Debridement - Tissue Removed Dead skin and slough   Wound Therapy - Clinical Statement Compression continues to be beneficial towards overall healing of wounds.  Increased granulation tissues following debridement for removal of overall dead skin perimeter of wound.  Continued with xeroform, vaseline, with gause and coban.  Minimal reports of pain through session, pt did report increased tenderness with debridement that was resolved at EOS.  Utilized medipore tape with coban and kerlix and proximal dressings to assist with pt. not removing.    Wound Therapy - Functional Problem List open wounds L LE    Factors Delaying/Impairing Wound Healing Other (comment)  dementia   Wound Therapy - Frequency Other (comment)  2x/week   Wound Therapy - Current Recommendations PT   Wound Plan Continue wound care with appropriate dressings.  No medipore tape to skin due to fragile skin   Dressing  xeroform, kerlix and coban.                     PT Short Term Goals - 05/28/15 1518    PT SHORT TERM GOAL #1   Title Patient will demonstrate slough 0% in all wounds in order to demonstrate improvement of general condition    Time 3   Period Weeks   Status On-going   PT SHORT TERM GOAL #2   Title Patient to demonstrate full resolution of two smaller wounds in order to demonstrate improvement of general condition    Time 3   Period Weeks   Status On-going   PT SHORT TERM GOAL #3   Title Patient to demonstrate reduction of size of largest wound by at least 50% in order to demonstrate improvement of condition    Time 3   Period Weeks   Status On-going            PT Long Term Goals - 05/28/15 1518    PT LONG TERM GOAL #1   Title Patient to demonstrate complete healing and closure of all 3 wounds in order to demonstrate improvement of condition    Time 6   Period Weeks   Status On-going   PT LONG TERM GOAL #2   Title Patient to be able to state 3 safety precautions for use in  her home in order to prevent falls or accidents that might open additional wounds in order to improve safety at home    Time 6   Period Weeks   Status On-going             Patient will benefit from skilled therapeutic intervention in order to improve the following deficits and impairments:     Visit Diagnosis: Unspecified open wound, left lower leg, sequela  Pain in left lower leg     Problem List Patient Active Problem List   Diagnosis Date Noted  . Bronchospasm 06/12/2014  . Acute respiratory failure with hypoxia ([place]) 06/12/2014  . Allergic reaction   . Esophageal reflux 08/17/2013  . COPD (chronic obstructive pulmonary disease) ([place]) 08/11/2013  . Acute pyelonephritis 08/03/2013  . Septic shock due to Enterococcus species ([place]) 08/03/2013  . Septicemia ([place]) 08/03/2013  . Gram negative septic shock ([place]) 08/03/2013  . NSTEMI (non-ST elevated myocardial infarction) type 2 07/26/2013  . Dehydration 07/25/2013  . AKI (acute kidney injury) ([place]) 07/25/2013  . Elevated troponin 07/25/2013  . Colitis 07/08/2013  . Nausea and vomiting 07/08/2013  . Abdominal pain, unspecified site 07/08/2013  . Hydronephrosis, left 07/08/2013  . Microscopic colitis 06/27/2013  . Memory loss 04/25/2013  . Depression 04/25/2013  . Loss of weight 04/20/2013  . Melena 04/20/2013  . Diarrhea 04/20/2013  . SAH (subarachnoid hemorrhage) ([place]) 11/22/2012  . Seizures secondary to [facility] 11/22/2012  . Bilateral lower leg cellulitis 11/13/2012  . Open wound of leg 11/13/2012  . Oral thrush 11/13/2012   Ihor Austin, LPTA; [place]  Aldona Lento 06/11/2015, 9:49 AM  [place], [place], [address] Phone: [phone]   Fax:  [phone]  Name: KADIN SABEY MRN: [id number] Date of Birth: May 26, 1928

## 2015-06-11 NOTE — Telephone Encounter (Signed)
yes

## 2015-06-12 NOTE — Telephone Encounter (Signed)
Yes refill it.

## 2015-06-13 ENCOUNTER — Ambulatory Visit (HOSPITAL_COMMUNITY): Payer: Medicare Other | Admitting: Physical Therapy

## 2015-06-14 ENCOUNTER — Ambulatory Visit (HOSPITAL_COMMUNITY): Payer: Medicare Other

## 2015-06-14 DIAGNOSIS — S81802S Unspecified open wound, left lower leg, sequela: Secondary | ICD-10-CM | POA: Diagnosis not present

## 2015-06-14 DIAGNOSIS — M79662 Pain in left lower leg: Secondary | ICD-10-CM

## 2015-06-14 NOTE — Therapy (Signed)
[facility] [address] [place], [place], [address] Phone: [phone]   Fax:  [phone]  Physical Therapy Treatment  Patient Details  Name: Bridget Ball MRN: [id number] Date of Birth: [birth year] No Data Recorded  Encounter Date: 06/14/2015      PT End of Session - 06/14/15 1454    Visit Number 12   Number of Visits 18   Date for PT Re-Evaluation 07/04/15   Authorization Type [facility] complete plan/HMO    Authorization Time Period 06/04/15-07/05/15.  Gcode completed on visit 9   Authorization - Visit Number 12   Authorization - Number of Visits 19   PT Start Time 1350   PT Stop Time 1415   PT Time Calculation (min) 25 min   Activity Tolerance Patient tolerated treatment well   Behavior During Therapy WFL for tasks assessed/performed      Past Medical History  Diagnosis Date  . COPD (chronic obstructive pulmonary disease) ([place])   . Arthritis   . Depression   . Memory difficulties   . Chronic diarrhea   . Hiatal hernia   . Cancer [facility])     Skin cancer  . Hyperlipidemia   . Myocardial infarction [facility])     unknown time   . Shortness of breath     with exertion  . Seizures ([place])     x 2 last year due to bleeding to the brain.  . Stroke [facility])     TIA four years ago / stroke NOV 2014  . Bruises easily   . GERD (gastroesophageal reflux disease)   . Microscopic colitis   . Colitis   . Hydronephrosis, left   . Hx of septic shock JULY 2015  . Dementia   . Anxiety   . Hx of pulmonary edema JULY 2015  . Attention to nephrostomy ([place])     PT HAS NEPHROSTOMY TUBE IN PLACE  . Hx of bronchitis   . Hx of pulmonary edema JULY 2015    Past Surgical History  Procedure Laterality Date  . Appendectomy    . Skin cancer excision    . Tubal ligation    . Colonoscopy N/A 05/24/2013    Procedure: COLONOSCOPY;  Surgeon: Rogene Houston, MD;  Location[facility];  Service: Endoscopy;  Laterality: N/A;  100  .  Esophagogastroduodenoscopy N/A 05/24/2013    Procedure: ESOPHAGOGASTRODUODENOSCOPY (EGD);  Surgeon: Rogene Houston, MD;  Location: [facility];  Service: Endoscopy;  Laterality: N/A;  . Tonsillectomy    . Cystoscopy with retrograde pyelogram, ureteroscopy and stent placement Left 09/18/2013    Procedure: CYSTOSCOPY WITH RETROGRADE PYELOGRAM,  URETEROSCOPY AND  STENT PLACEMENT, removal of nephrostomy tube;  Surgeon: Jorja Loa, MD;  Location: [facility] ORS;  Service: Urology;  Laterality: Left;  . Breast biopsy Right 02/02/2014    Procedure: RIGHT BREAST BIOPSY;  Surgeon: Jamesetta So, MD;  Location: [facility] ORS;  Service: General;  Laterality: Right;  . Cystoscopy with stent placement Left 09/04/2014    Procedure: CYSTOSCOPY WITH LEFT J2 STENT EXCHANGE;  Surgeon: Franchot Gallo, MD;  Location: [facility] ORS;  Service: Urology;  Laterality: Left;    There were no vitals filed for this visit.      Subjective Assessment - 06/14/15 1425    Subjective Pt entered dept with dressings intact, no reports of pain today.   Currently in Pain? No/denies  Wound Therapy - 06/14/15 1426    Subjective Pt entered dept with dressings intact, no reports of pain today.   Patient and Family Stated Goals wounds to heal    Date of Onset --  months ago   Pain Assessment No/denies pain   Wound Properties Date First Assessed: 05/06/15 Wound Type: Other (Comment) Location: Tibial Location Orientation: Left Wound Description (Comments): lateral L tibia  Present on Admission: Yes   Dressing Type Gauze (Comment);Impregnated gauze (petrolatum);Compression wrap  vaseline, xeroform, kerlix, coban and netting   Dressing Changed Changed   Dressing Status Old drainage   Dressing Change Frequency PRN   Site / Wound Assessment Clean;Dry;Pale   % Wound base Red or Granulating 95%   % Wound base Yellow 5%   % Wound base Black 0%   Peri-wound Assessment Pink   Drainage Amount Scant   Drainage Description  Serosanguineous   Treatment Cleansed;Debridement (Selective)   Selective Debridement - Location Lt LE multiple wounds   Selective Debridement - Tools Used Other (comment)  cleansed   Selective Debridement - Tissue Removed Dead skin and slough   Wound Therapy - Clinical Statement Pt left dressings intact since last session with vast improvements noted today.  Minimal debridement required this session, due to increased granulation tissues upon removal of dressings, self care charged only.  Pt educated on importance of keeping skin moisturized.  Did continues with xeroform on wounds with kerlix and coban wiht minimal medipore tape on proximal dressings to reduce ability of pt. scratching LE.     Wound Therapy - Functional Problem List open wounds L LE    Factors Delaying/Impairing Wound Healing Other (comment)  dementia   Wound Therapy - Frequency Other (comment)  2x/ week   Wound Therapy - Current Recommendations PT   Wound Plan Continue wound care with appropriate dressings.  No medipore tape to skin due to fragile skin.  Anticipate D/C next few sessions coming.   Dressing  xeroform, kerlix and coban.               PT Short Term Goals - 05/28/15 1518    PT SHORT TERM GOAL #1   Title Patient will demonstrate slough 0% in all wounds in order to demonstrate improvement of general condition    Time 3   Period Weeks   Status On-going   PT SHORT TERM GOAL #2   Title Patient to demonstrate full resolution of two smaller wounds in order to demonstrate improvement of general condition    Time 3   Period Weeks   Status On-going   PT SHORT TERM GOAL #3   Title Patient to demonstrate reduction of size of largest wound by at least 50% in order to demonstrate improvement of condition    Time 3   Period Weeks   Status On-going           PT Long Term Goals - 05/28/15 1518    PT LONG TERM GOAL #1   Title Patient to demonstrate complete healing and closure of all 3 wounds in order to  demonstrate improvement of condition    Time 6   Period Weeks   Status On-going   PT LONG TERM GOAL #2   Title Patient to be able to state 3 safety precautions for use in her home in order to prevent falls or accidents that might open additional wounds in order to improve safety at home    Time 6   Period Weeks   Status  On-going             Patient will benefit from skilled therapeutic intervention in order to improve the following deficits and impairments:     Visit Diagnosis: Unspecified open wound, left lower leg, sequela  Pain in left lower leg     Problem List Patient Active Problem List   Diagnosis Date Noted  . Bronchospasm 06/12/2014  . Acute respiratory failure with hypoxia ([place]) 06/12/2014  . Allergic reaction   . Esophageal reflux 08/17/2013  . COPD (chronic obstructive pulmonary disease) ([place]) 08/11/2013  . Acute pyelonephritis 08/03/2013  . Septic shock due to Enterococcus species ([place]) 08/03/2013  . Septicemia ([place]) 08/03/2013  . Gram negative septic shock ([place]) 08/03/2013  . NSTEMI (non-ST elevated myocardial infarction) type 2 07/26/2013  . Dehydration 07/25/2013  . AKI (acute kidney injury) ([place]) 07/25/2013  . Elevated troponin 07/25/2013  . Colitis 07/08/2013  . Nausea and vomiting 07/08/2013  . Abdominal pain, unspecified site 07/08/2013  . Hydronephrosis, left 07/08/2013  . Microscopic colitis 06/27/2013  . Memory loss 04/25/2013  . Depression 04/25/2013  . Loss of weight 04/20/2013  . Melena 04/20/2013  . Diarrhea 04/20/2013  . SAH (subarachnoid hemorrhage) ([place]) 11/22/2012  . Seizures secondary to [facility] 11/22/2012  . Bilateral lower leg cellulitis 11/13/2012  . Open wound of leg 11/13/2012  . Oral thrush 11/13/2012   Ihor Austin, [place]; [place]  Aldona Lento 06/14/2015, 2:55 PM  [facility] [address] [place], [place], [address] Phone: [phone]   Fax:   [phone]  Name: SHENEQUA SYKORA MRN: [id number] Date of Birth: 12-27-1928

## 2015-06-17 ENCOUNTER — Ambulatory Visit (HOSPITAL_COMMUNITY): Payer: Medicare Other | Admitting: Physical Therapy

## 2015-06-17 DIAGNOSIS — M79662 Pain in left lower leg: Secondary | ICD-10-CM | POA: Diagnosis not present

## 2015-06-17 DIAGNOSIS — S81802S Unspecified open wound, left lower leg, sequela: Secondary | ICD-10-CM

## 2015-06-17 NOTE — Therapy (Signed)
[place], [place], [address] Phone: [phone]   Fax:  [phone]  Wound Care Therapy (Discharge)  Patient Details  Name: Bridget Ball MRN: [id number] Date of Birth: May 22, 1928 No Data Recorded  Encounter Date: 06/17/2015      PT End of Session - 06/17/15 1458    Visit Number 13   Number of Visits 18   Date for PT Re-Evaluation 07/04/15   Authorization Type [facility] complete plan/HMO    Authorization Time Period 06/04/15-07/05/15.  Gcode completed on visit 9   Authorization - Visit Number 13   Authorization - Number of Visits 19   PT Start Time 1430   PT Stop Time 1445   PT Time Calculation (min) 15 min   Activity Tolerance Patient tolerated treatment well   Behavior During Therapy WFL for tasks assessed/performed      Past Medical History  Diagnosis Date  . COPD (chronic obstructive pulmonary disease) ([place])   . Arthritis   . Depression   . Memory difficulties   . Chronic diarrhea   . Hiatal hernia   . Cancer [facility])     Skin cancer  . Hyperlipidemia   . Myocardial infarction [facility])     unknown time   . Shortness of breath     with exertion  . Seizures ([place])     x 2 last year due to bleeding to the brain.  . Stroke [facility])     TIA four years ago / stroke NOV 2014  . Bruises easily   . GERD (gastroesophageal reflux disease)   . Microscopic colitis   . Colitis   . Hydronephrosis, left   . Hx of septic shock JULY 2015  . Dementia   . Anxiety   . Hx of pulmonary edema JULY 2015  . Attention to nephrostomy ([place])     PT HAS NEPHROSTOMY TUBE IN PLACE  . Hx of bronchitis   . Hx of pulmonary edema JULY 2015    Past Surgical History  Procedure Laterality Date  . Appendectomy    . Skin cancer excision    . Tubal ligation    . Colonoscopy N/A 05/24/2013    Procedure: COLONOSCOPY;  Surgeon: Rogene Houston, MD;  Location: [facility] ENDO SUITE;  Service: Endoscopy;  Laterality: N/A;  100  .  Esophagogastroduodenoscopy N/A 05/24/2013    Procedure: ESOPHAGOGASTRODUODENOSCOPY (EGD);  Surgeon: Rogene Houston, MD;  Location: [facility] ENDO SUITE;  Service: Endoscopy;  Laterality: N/A;  . Tonsillectomy    . Cystoscopy with retrograde pyelogram, ureteroscopy and stent placement Left 09/18/2013    Procedure: CYSTOSCOPY WITH RETROGRADE PYELOGRAM,  URETEROSCOPY AND  STENT PLACEMENT, removal of nephrostomy tube;  Surgeon: Jorja Loa, MD;  Location: [facility] ORS;  Service: Urology;  Laterality: Left;  . Breast biopsy Right 02/02/2014    Procedure: RIGHT BREAST BIOPSY;  Surgeon: Jamesetta So, MD;  Location: [facility] ORS;  Service: General;  Laterality: Right;  . Cystoscopy with stent placement Left 09/04/2014    Procedure: CYSTOSCOPY WITH LEFT J2 STENT EXCHANGE;  Surgeon: Franchot Gallo, MD;  Location: [facility] ORS;  Service: Urology;  Laterality: Left;    There were no vitals filed for this visit.                  Wound Therapy - 06/17/15 1452    Subjective Dressings intact, no reports of pain today.   Patient and Family Stated Goals wounds to heal  Date of Onset --  months ago   Pain Assessment No/denies pain   Wound Properties Date First Assessed: 05/06/15 Wound Type: Other (Comment) Location: Tibial Location Orientation: Left Wound Description (Comments): lateral L tibia  Present on Admission: Yes   Dressing Type Gauze (Comment);Impregnated gauze (petrolatum);Compression wrap  vaseline, xeroform, kerlix, coban and netting   Dressing Changed Other (Comment)   Dressing Status None   Dressing Change Frequency PRN   Site / Wound Assessment Clean;Dry;Pale   % Wound base Red or Granulating 100%   % Wound base Yellow 0%   % Wound base Black 0%   Peri-wound Assessment Pink   Drainage Amount None   Drainage Description Serosanguineous   Treatment Cleansed   Wound Therapy - Clinical Statement Removed bandages revealing all areas completely healed.  Perimeter and over skin integrity much  improved.  Pt educated on skin care and injury prevention.  Cleansed and Moisturized Lt LE well prior to leaving.  All goals met without further skilled need at this time.    Factors Delaying/Impairing Wound Healing Other (comment)  dementia   Wound Therapy - Frequency Other (comment)  2x/ week   Wound Therapy - Current Recommendations PT   Wound Plan Discharge as wounds healed and no longer requires skilled therapy.   Dressing  none                 PT Education - 06/17/15 1506    Education provided Yes   Education Details Skin protection including moisturizing and prevention of injury   Person(s) Educated Patient   Methods Explanation   Comprehension Verbalized understanding          PT Short Term Goals - 06/17/15 1456    PT SHORT TERM GOAL #1   Title Patient will demonstrate slough 0% in all wounds in order to demonstrate improvement of general condition    Time 3   Period Weeks   Status Achieved   PT SHORT TERM GOAL #2   Title Patient to demonstrate full resolution of two smaller wounds in order to demonstrate improvement of general condition    Time 3   Period Weeks   Status Achieved   PT SHORT TERM GOAL #3   Title Patient to demonstrate reduction of size of largest wound by at least 50% in order to demonstrate improvement of condition    Time 3   Period Weeks   Status Achieved           PT Long Term Goals - 06/17/15 1456    PT LONG TERM GOAL #1   Title Patient to demonstrate complete healing and closure of all 3 wounds in order to demonstrate improvement of condition    Time 6   Period Weeks   Status Achieved   PT LONG TERM GOAL #2   Title Patient to be able to state 3 safety precautions for use in her home in order to prevent falls or accidents that might open additional wounds in order to improve safety at home    Time 6   Period Weeks   Status Achieved             Patient will benefit from skilled therapeutic intervention in order to  improve the following deficits and impairments:     Visit Diagnosis: Unspecified open wound, left lower leg, sequela  Pain in left lower leg    Problem List Patient Active Problem List   Diagnosis Date Noted  . Bronchospasm 06/12/2014  .  Acute respiratory failure with hypoxia ([place]) 06/12/2014  . Allergic reaction   . Esophageal reflux 08/17/2013  . COPD (chronic obstructive pulmonary disease) ([place]) 08/11/2013  . Acute pyelonephritis 08/03/2013  . Septic shock due to Enterococcus species ([place]) 08/03/2013  . Septicemia ([place]) 08/03/2013  . Gram negative septic shock ([place]) 08/03/2013  . NSTEMI (non-ST elevated myocardial infarction) type 2 07/26/2013  . Dehydration 07/25/2013  . AKI (acute kidney injury) ([place]) 07/25/2013  . Elevated troponin 07/25/2013  . Colitis 07/08/2013  . Nausea and vomiting 07/08/2013  . Abdominal pain, unspecified site 07/08/2013  . Hydronephrosis, left 07/08/2013  . Microscopic colitis 06/27/2013  . Memory loss 04/25/2013  . Depression 04/25/2013  . Loss of weight 04/20/2013  . Melena 04/20/2013  . Diarrhea 04/20/2013  . SAH (subarachnoid hemorrhage) ([place]) 11/22/2012  . Seizures secondary to [facility] 11/22/2012  . Bilateral lower leg cellulitis 11/13/2012  . Open wound of leg 11/13/2012  . Oral thrush 11/13/2012    Teena Irani, PTA/CLT [phone]  06-22-2015, 4:39 PM       G-Codes - 2015/06/22 1638    Functional Assessment Tool Used Based on skilled clinical assessment of slough, wound healing status, pain    Functional Limitation Other PT primary   Other PT Primary Goal Status (V3552) At least 20 percent but less than 40 percent impaired, limited or restricted   Other PT Primary Discharge Status [phone]) 0 percent impaired, limited or restricted     PHYSICAL THERAPY DISCHARGE SUMMARY  Visits from Start of Care: 13  Current functional level related to goals / functional outcomes: Patient arrived today with all wounds healed and was educated  on importance of skin care in preventing further wound occurrence. DC today as patient no longer in need of skilled wound care PT.    Remaining deficits: Wounds fully healed    Education / Equipment: Skin care moving forward  Plan: Patient agrees to discharge.  Patient goals were met. Patient is being discharged due to meeting the stated rehab goals.  ?????       Deniece Ree PT, DPT [place] [address] [place], [place], [address] Phone: [phone]   Fax:  [phone]  Name: Bridget Ball MRN: [id number] Date of Birth: 11-03-1928

## 2015-06-18 ENCOUNTER — Ambulatory Visit (INDEPENDENT_AMBULATORY_CARE_PROVIDER_SITE_OTHER): Payer: Medicare Other | Admitting: Internal Medicine

## 2015-06-18 ENCOUNTER — Ambulatory Visit (INDEPENDENT_AMBULATORY_CARE_PROVIDER_SITE_OTHER): Payer: Medicare Other | Admitting: Urology

## 2015-06-18 ENCOUNTER — Other Ambulatory Visit (HOSPITAL_COMMUNITY)
Admission: RE | Admit: 2015-06-18 | Discharge: 2015-06-18 | Disposition: A | Discharge status: Home / Self Care | Payer: Medicare Other | Source: Ambulatory Visit | Attending: Urology | Admitting: Urology

## 2015-06-18 ENCOUNTER — Encounter (INDEPENDENT_AMBULATORY_CARE_PROVIDER_SITE_OTHER): Payer: Self-pay | Admitting: Internal Medicine

## 2015-06-18 VITALS — BP 98/58 | HR 68 | Temp 98.6°F | Resp 18 | Ht 61.0 in | Wt 97.0 lb

## 2015-06-18 DIAGNOSIS — R634 Abnormal weight loss: Secondary | ICD-10-CM

## 2015-06-18 DIAGNOSIS — K52839 Microscopic colitis, unspecified: Secondary | ICD-10-CM

## 2015-06-18 DIAGNOSIS — R338 Other retention of urine: Secondary | ICD-10-CM

## 2015-06-18 DIAGNOSIS — N390 Urinary tract infection, site not specified: Secondary | ICD-10-CM | POA: Insufficient documentation

## 2015-06-18 LAB — URINALYSIS, ROUTINE W REFLEX MICROSCOPIC
Bilirubin Urine: NEGATIVE
GLUCOSE, UA: NEGATIVE mg/dL
Ketones, ur: NEGATIVE mg/dL
Nitrite: POSITIVE — AB
PH: 6 (ref 5.0–8.0)
PROTEIN: 100 mg/dL — AB
Specific Gravity, Urine: 1.02 (ref 1.005–1.030)

## 2015-06-18 LAB — URINE MICROSCOPIC-ADD ON

## 2015-06-18 MED ORDER — INULIN 1.5 G PO CHEW: 1.0000 | CHEWABLE_TABLET | Freq: Two times a day (BID) | ORAL | Status: DC

## 2015-06-18 NOTE — Patient Instructions (Signed)
Take Entocort EC 6 mg daily for 4 weeks and then 3 mg daily for 4 weeks until prescription runs out. Take Imodium OTC or loperamide OTC 2 mg before breakfast daily. Fiber choice 1 tablet once or twice daily. Can use Dulcolax suppository for constipation. Progress report in one month.

## 2015-06-18 NOTE — Progress Notes (Signed)
Presenting complaint;  Follow for diarrhea andweight loss.  Subjective:  Patient is 82 year old Caucasian female who is here for scheduled visit accompanied by her daughter [name]. Her daughter states she is doing better. She is having 2-3 bowel movements per day. She rarely has a formed stool. However she is not having nocturnal diarrhea. She had one accident since her last visit. She is on 2 tablets of Entocort EC every day. Her appetite is fair. Her weight is down by 2 pounds since her last visit. She denies melena or rectal bleeding or abdominal pain. Her daughter states she is spending more time with her and she is eating better. She is not taking Imodium on schedule.    Current Medications: Outpatient Encounter Prescriptions as of 06/18/2015  Medication Sig  . albuterol (PROVENTIL HFA;VENTOLIN HFA) 108 (90 BASE) MCG/ACT inhaler Inhale 2 puffs into the lungs every 6 (six) hours as needed for wheezing or shortness of breath.  Marland Kitchen atorvastatin (LIPITOR) 80 MG tablet Take 1 tablet (80 mg total) by mouth daily at 6 PM.  . budesonide (ENTOCORT EC) 3 MG 24 hr capsule Take 6 mg by mouth daily after breakfast.  . budesonide-formoterol (SYMBICORT) 160-4.5 MCG/ACT inhaler Inhale 2 puffs into the lungs 2 (two) times daily.  . calcium carbonate (TUMS - DOSED IN MG ELEMENTAL CALCIUM) 500 MG chewable tablet Chew 1 tablet by mouth as needed for indigestion or heartburn.  . citalopram (CELEXA) 10 MG tablet TAKE 1 TABLET (10 MG TOTAL) BY MOUTH DAILY.  Marland Kitchen levETIRAcetam (KEPPRA) 250 MG tablet Take 1 tablet (250 mg total) by mouth 2 (two) times daily.  Marland Kitchen loperamide (IMODIUM) 2 MG capsule Take 1 capsule (2 mg total) by mouth daily before breakfast.  . LORazepam (ATIVAN) 1 MG tablet 1 mg at bedtime. Per daughter only as needed for sleep.  . Methylfol-Algae-B12-Acetylcyst (CEREFOLIN NAC) 6-90.314-2-600 MG TABS Take 1 tablet by mouth daily.  . pantoprazole (PROTONIX) 40 MG tablet TAKE 1 TABLET BY MOUTH EVERY MORNING  WITH BREAKFAST  . solifenacin (VESICARE) 5 MG tablet Take 5 mg by mouth daily.  Marland Kitchen SPIRIVA HANDIHALER 18 MCG inhalation capsule Place 1 capsule into inhaler and inhale daily.  . [DISCONTINUED] feeding supplement, ENSURE COMPLETE, (ENSURE COMPLETE) LIQD Take 237 mLs by mouth 3 (three) times daily between meals. (Patient not taking: Reported on 06/18/2015)  . [DISCONTINUED] ibuprofen (ADVIL,MOTRIN) 200 MG tablet Take 200 mg by mouth as needed. Reported on 06/18/2015   No facility-administered encounter medications on file as of 06/18/2015.     Objective: Blood pressure 98/58, pulse 68, temperature 98.6 F (37 C), temperature source Oral, resp. rate 18, height 5\' 1"  (1.549 m), weight 97 lb (43.999 kg). Patient is alert and n no acute ditress. Conjunctiva is pink. Sclera is nonicteric Oropharyngeal mucosa is normal. No neck masses or thyromegaly noted. Cardiac exam with regular rhythm normal S1 and S2. No murmur or gallop noted. Lungs are clear to auscultation. Abdomen is flat and soft without tenderness organomegaly or masses. No LE edema or clubbing noted. Skin of upper extremities is very thin    Assessment:  #1. Chronic diarrhea secondary to microscopic colitis. She is doing much better on budesonide but she will have to come off it at some point. #2.  Weight loss. Her weight is down by 2 pounds. Weight loss does not appear to be secondary to diarrhea. Weight loss appears to be due to diminished oral intake. .  Plan:  Take Entocort EC as follows;  6 mg daily  for 4 weeks and then 3 mg daily until prescription runs out. Take Imodium OTC 2 mg very every morning and second dose on as basis. Must eat snacks between meals. Fiber choice 1-2 tablets per day. Office visit in 3 months.

## 2015-06-20 ENCOUNTER — Encounter (HOSPITAL_COMMUNITY): Payer: Self-pay

## 2015-06-20 ENCOUNTER — Ambulatory Visit (HOSPITAL_COMMUNITY): Payer: Medicare Other | Admitting: Physical Therapy

## 2015-06-20 ENCOUNTER — Emergency Department (HOSPITAL_COMMUNITY)
Admission: EM | Admit: 2015-06-20 | Discharge: 2015-06-20 | Disposition: A | Discharge status: Home / Self Care | Payer: Medicare Other | Attending: Emergency Medicine | Admitting: Emergency Medicine

## 2015-06-20 DIAGNOSIS — N390 Urinary tract infection, site not specified: Secondary | ICD-10-CM

## 2015-06-20 DIAGNOSIS — M1990 Unspecified osteoarthritis, unspecified site: Secondary | ICD-10-CM | POA: Diagnosis not present

## 2015-06-20 DIAGNOSIS — Z794 Long term (current) use of insulin: Secondary | ICD-10-CM | POA: Insufficient documentation

## 2015-06-20 DIAGNOSIS — E785 Hyperlipidemia, unspecified: Secondary | ICD-10-CM | POA: Diagnosis not present

## 2015-06-20 DIAGNOSIS — R339 Retention of urine, unspecified: Secondary | ICD-10-CM | POA: Diagnosis present

## 2015-06-20 DIAGNOSIS — F329 Major depressive disorder, single episode, unspecified: Secondary | ICD-10-CM | POA: Insufficient documentation

## 2015-06-20 DIAGNOSIS — Z87891 Personal history of nicotine dependence: Secondary | ICD-10-CM | POA: Diagnosis not present

## 2015-06-20 DIAGNOSIS — T839XXA Unspecified complication of genitourinary prosthetic device, implant and graft, initial encounter: Secondary | ICD-10-CM

## 2015-06-20 DIAGNOSIS — Y829 Unspecified medical devices associated with adverse incidents: Secondary | ICD-10-CM | POA: Insufficient documentation

## 2015-06-20 DIAGNOSIS — Z8673 Personal history of transient ischemic attack (TIA), and cerebral infarction without residual deficits: Secondary | ICD-10-CM | POA: Insufficient documentation

## 2015-06-20 DIAGNOSIS — T83098A Other mechanical complication of other indwelling urethral catheter, initial encounter: Secondary | ICD-10-CM | POA: Diagnosis not present

## 2015-06-20 DIAGNOSIS — Z79899 Other long term (current) drug therapy: Secondary | ICD-10-CM | POA: Insufficient documentation

## 2015-06-20 DIAGNOSIS — T83511A Infection and inflammatory reaction due to indwelling urethral catheter, initial encounter: Secondary | ICD-10-CM | POA: Diagnosis not present

## 2015-06-20 DIAGNOSIS — J449 Chronic obstructive pulmonary disease, unspecified: Secondary | ICD-10-CM | POA: Insufficient documentation

## 2015-06-20 DIAGNOSIS — I252 Old myocardial infarction: Secondary | ICD-10-CM | POA: Insufficient documentation

## 2015-06-20 MED ORDER — CIPROFLOXACIN HCL 500 MG PO TABS: 500.0000 mg | ORAL_TABLET | Freq: Two times a day (BID) | ORAL | Status: DC

## 2015-06-20 MED ORDER — CIPROFLOXACIN HCL 250 MG PO TABS
500.0000 mg | ORAL_TABLET | Freq: Once | ORAL | Status: AC
  Administered 2015-06-20: 500 mg via ORAL
  Filled 2015-06-20: qty 2

## 2015-06-20 NOTE — Discharge Instructions (Signed)

## 2015-06-20 NOTE — ED Provider Notes (Signed)
CSN: [id number]     Arrival date & time 06/20/15  0830 History  By signing my name below, I, Bridget Ball, attest that this documentation has been prepared under the direction and in the presence of Bridget Manifold, MD.   Electronically Signed: Nicole Ball, ED Scribe. 06/20/2015. 9:00 AM   Chief Complaint  Patient presents with  . Urinary Retention   The history is provided by the patient. No language interpreter was used.   HPI Comments: Bridget Ball is a 82 y.o. female with extensive PMHx as noted below and PSHx of kidney stent placement who presents to the Emergency Department complaining of gradual onset, urinary retention, ongoing for a few days. Pt had a foley catheter placed on 06/11/2015 but has had to change it three times because it has not drained properly. Pt reports feeling as if she needs to void but has not been draining into the bag. Daughter reports pt voided around the catheter this morning. No other associated symptoms noted. No worsening or alleviating factors noted. Pt denies back pain, abdominal pain, hematuria, or any other pertinent symptoms.   Past Medical History  Diagnosis Date  . COPD (chronic obstructive pulmonary disease) ([place])   . Arthritis   . Depression   . Memory difficulties   . Chronic diarrhea   . Hiatal hernia   . Cancer [facility])     Skin cancer  . Hyperlipidemia   . Myocardial infarction [facility])     unknown time   . Shortness of breath     with exertion  . Seizures ([place])     x 2 last year due to bleeding to the brain.  . Stroke [facility])     TIA four years ago / stroke NOV 2014  . Bruises easily   . GERD (gastroesophageal reflux disease)   . Microscopic colitis   . Colitis   . Hydronephrosis, left   . Hx of septic shock JULY 2015  . Dementia   . Anxiety   . Hx of pulmonary edema JULY 2015  . Attention to nephrostomy ([place])     PT HAS NEPHROSTOMY TUBE IN PLACE  . Hx of bronchitis   . Hx of pulmonary edema JULY 2015   Past Surgical  History  Procedure Laterality Date  . Appendectomy    . Skin cancer excision    . Tubal ligation    . Colonoscopy N/A 05/24/2013    Procedure: COLONOSCOPY;  Surgeon: Bridget Houston, MD;  Location: [facility] ENDO SU[facility];  Service: Endoscopy;  Laterality: N/A;  100  . Esophagogastroduodenoscopy N/A 05/24/2013    Procedure: ESOPHAGOGASTRODUODENOSCOPY (EGD);  Surgeon: Bridget Houston, MD;  Location: [facility] ENDO SUITE;  Service: Endoscopy;  Laterality: N/A;  . Tonsillectomy    . Cystoscopy with retrograde pyelogram, ureteroscopy and stent placement Left 09/18/2013    Procedure: CYSTOSCOPY WITH RETROGRADE PYELOGRAM,  URETEROSCOPY AND  STENT PLACEMENT, removal of nephrostomy tube;  Surgeon: Bridget Loa, MD;  Location: [facility] ORS;  Service: Urology;  Laterality: Left;  . Breast biopsy Right 02/02/2014    Procedure: RIGHT BREAST BIOPSY;  Surgeon: Bridget So, MD;  Location: [facility] ORS;  Service: General;  Laterality: Right;  . Cystoscopy with stent placement Left 09/04/2014    Procedure: CYSTOSCOPY WITH LEFT J2 STENT EXCHANGE;  Surgeon: Bridget Gallo, MD;  Location: [facility] ORS;  Service: Urology;  Laterality: Left;   Family History  Problem Relation Age of Onset  . Diabetes Mother   . Stroke Mother  Social History  Substance Use Topics  . Smoking status: Former Smoker -- 1.00 packs/day for 50 years    Types: Cigarettes    Quit date: 11/13/1997  . Smokeless tobacco: Never Used  . Alcohol Use: 4.2 oz/week    7 Glasses of wine per week     Comment: 2 glasses a night wine (none in a year)   OB History    No data available     Review of Systems  Gastrointestinal: Negative for abdominal pain.  Genitourinary: Positive for difficulty urinating. Negative for hematuria.       Urinary retention.  Musculoskeletal: Negative for back pain.  All other systems reviewed and are negative.     Allergies  Penicillins  Home Medications   Prior to Admission medications   Medication Sig Start Date End Date  Taking? Authorizing Provider  albuterol (PROVENTIL HFA;VENTOLIN HFA) 108 (90 BASE) MCG/ACT inhaler Inhale 2 puffs into the lungs every 6 (six) hours as needed for wheezing or shortness of breath. 08/03/13   Janece Canterbury, MD  atorvastatin (LIPITOR) 80 MG tablet Take 1 tablet (80 mg total) by mouth daily at 6 PM. 08/03/13   Janece Canterbury, MD  budesonide (ENTOCORT EC) 3 MG 24 hr capsule Take 6 mg by mouth daily after breakfast. 03/20/15   Historical Provider, MD  budesonide-formoterol (SYMBICORT) 160-4.5 MCG/ACT inhaler Inhale 2 puffs into the lungs 2 (two) times daily. 06/13/14   Bridget Cota, MD  calcium carbonate (TUMS - DOSED IN MG ELEMENTAL CALCIUM) 500 MG chewable tablet Chew 1 tablet by mouth as needed for indigestion or heartburn.    Historical Provider, MD  citalopram (CELEXA) 10 MG tablet TAKE 1 TABLET (10 MG TOTAL) BY MOUTH DAILY. 06/11/15   Bridget Fila, MD  Inulin (FIBER CHOICE) 1.5 g CHEW Chew 1 tablet by mouth 2 (two) times daily. 06/18/15   Bridget Houston, MD  levETIRAcetam (KEPPRA) 250 MG tablet Take 1 tablet (250 mg total) by mouth 2 (two) times daily. 03/20/15   Ward Givens, NP  loperamide (IMODIUM) 2 MG capsule Take 1 capsule (2 mg total) by mouth daily before breakfast. 04/09/15   Bridget Houston, MD  LORazepam (ATIVAN) 1 MG tablet 1 mg at bedtime. Per daughter only as needed for sleep. 05/16/15   Historical Provider, MD  Methylfol-Algae-B12-Acetylcyst (CEREFOLIN NAC) 6-90.314-2-600 MG TABS Take 1 tablet by mouth daily. 10/18/14   Bridget Fila, MD  pantoprazole (PROTONIX) 40 MG tablet TAKE 1 TABLET BY MOUTH EVERY MORNING WITH BREAKFAST 09/11/14   Bridget Penny, NP  solifenacin (VESICARE) 5 MG tablet Take 5 mg by mouth daily.    Historical Provider, MD  SPIRIVA HANDIHALER 18 MCG inhalation capsule Place 1 capsule into inhaler and inhale daily. 05/20/14   Historical Provider, MD   BP 118/81 mmHg  Pulse 88  Temp(Src) 98.3 F (36.8 C) (Oral)  Resp 18  Ht 5' (1.524 m)  Wt 97 lb  (43.999 kg)  BMI 18.94 kg/m2  SpO2 96% Physical Exam  Constitutional: She is oriented to person, place, and time. She appears well-developed and well-nourished. No distress.  HENT:  Head: Normocephalic and atraumatic.  Eyes: EOM are normal.  Neck: Normal range of motion.  Cardiovascular: Normal rate, regular rhythm and normal heart sounds.   Pulmonary/Chest: Effort normal and breath sounds normal.  Abdominal: Soft. She exhibits no distension. There is tenderness.  Mild suprapubic TTP.  Genitourinary:  Small amount of pale, yellow urine in foley bag.  Musculoskeletal: Normal  range of motion.  Neurological: She is alert and oriented to person, place, and time.  Skin: Skin is warm and dry.  Psychiatric: She has a normal mood and affect. Judgment normal.  Nursing note and vitals reviewed.   ED Course  Procedures (including critical care time) DIAGNOSTIC STUDIES: Oxygen Saturation is 96% on RA, normal by my interpretation.    COORDINATION OF CARE: 9:09 AM Discussed treatment plan with pt at bedside and pt agreed to plan.  Labs Review Labs Reviewed - No data to display  Imaging Review No results found. I have personally reviewed and evaluated these images and lab results as part of my medical decision-making.   EKG Interpretation None      MDM   Final diagnoses:  Foley catheter problem, initial encounter [facility])  Urinary tract infection associated with catheterization of urinary tract, initial encounter     Bridget Manifold, MD 07/03/15 1326

## 2015-06-20 NOTE — ED Notes (Signed)
Bladder scanner showed 560 mL of urine in bladder.

## 2015-06-20 NOTE — ED Notes (Signed)
[place] at bedside. 

## 2015-06-20 NOTE — ED Notes (Signed)
Daughter reports pt has a stent in one of her kidneys and started having back pain.  Reports went to the urologist on June 6th and was told she was not emptying her bladder fully.  They put in a foley but since then pt has had it changed 3 times because she was having problems with it not draining.  Reports pt feels the urge to void but not draining in the bag.  Daughter says pt voided around the cath a little bit this morning.

## 2015-06-23 ENCOUNTER — Encounter (HOSPITAL_COMMUNITY): Payer: Self-pay | Admitting: *Deleted

## 2015-06-23 ENCOUNTER — Emergency Department (HOSPITAL_COMMUNITY)
Admission: EM | Admit: 2015-06-23 | Discharge: 2015-06-23 | Disposition: A | Discharge status: Home / Self Care | Payer: Medicare Other | Attending: Emergency Medicine | Admitting: Emergency Medicine

## 2015-06-23 DIAGNOSIS — T83098A Other mechanical complication of other indwelling urethral catheter, initial encounter: Secondary | ICD-10-CM | POA: Diagnosis not present

## 2015-06-23 DIAGNOSIS — I252 Old myocardial infarction: Secondary | ICD-10-CM | POA: Insufficient documentation

## 2015-06-23 DIAGNOSIS — Z87891 Personal history of nicotine dependence: Secondary | ICD-10-CM | POA: Diagnosis not present

## 2015-06-23 DIAGNOSIS — R339 Retention of urine, unspecified: Secondary | ICD-10-CM | POA: Diagnosis present

## 2015-06-23 DIAGNOSIS — Z8673 Personal history of transient ischemic attack (TIA), and cerebral infarction without residual deficits: Secondary | ICD-10-CM | POA: Insufficient documentation

## 2015-06-23 DIAGNOSIS — F329 Major depressive disorder, single episode, unspecified: Secondary | ICD-10-CM | POA: Insufficient documentation

## 2015-06-23 DIAGNOSIS — Z85828 Personal history of other malignant neoplasm of skin: Secondary | ICD-10-CM | POA: Insufficient documentation

## 2015-06-23 DIAGNOSIS — J449 Chronic obstructive pulmonary disease, unspecified: Secondary | ICD-10-CM | POA: Diagnosis not present

## 2015-06-23 DIAGNOSIS — Y828 Other medical devices associated with adverse incidents: Secondary | ICD-10-CM | POA: Insufficient documentation

## 2015-06-23 DIAGNOSIS — E785 Hyperlipidemia, unspecified: Secondary | ICD-10-CM | POA: Diagnosis not present

## 2015-06-23 DIAGNOSIS — Z79899 Other long term (current) drug therapy: Secondary | ICD-10-CM | POA: Insufficient documentation

## 2015-06-23 DIAGNOSIS — M1990 Unspecified osteoarthritis, unspecified site: Secondary | ICD-10-CM | POA: Insufficient documentation

## 2015-06-23 DIAGNOSIS — T839XXA Unspecified complication of genitourinary prosthetic device, implant and graft, initial encounter: Secondary | ICD-10-CM

## 2015-06-23 DIAGNOSIS — Z794 Long term (current) use of insulin: Secondary | ICD-10-CM | POA: Diagnosis not present

## 2015-06-23 DIAGNOSIS — T8243XA Leakage of vascular dialysis catheter, initial encounter: Secondary | ICD-10-CM | POA: Diagnosis not present

## 2015-06-23 NOTE — Discharge Instructions (Signed)
Take your usual prescriptions as previously directed.  Call your regular Urologist on Monday to schedule a follow up appointment within the next 3 days.  Return to the Emergency Department immediately sooner if worsening.

## 2015-06-23 NOTE — ED Notes (Signed)
Pt states that she had a foley replaced on 06/20/2015, noticed that it had stopped draining tonight then started draining again,

## 2015-06-23 NOTE — ED Notes (Signed)
Pt stood and her leg bag began leaking from blue drainage tab. Foley bag to catheter with ectensive teaching regarding emptying, positioning, bag lower than bladder and caution with catheter - pt daughter appears anxious regarding catheter and the bag asking if she would be better with a leg bag. Pt's daughter is also anxious regarding leg bag leakage which is why she is given a larger foley bag. Pt dressed, to wheelchair and discharged with teach back and further reasurrances

## 2015-06-23 NOTE — ED Provider Notes (Signed)
CSN: [id number]     Arrival date & time 06/23/15  0133 History   First MD Initiated Contact with Patient 06/23/15 0144     Chief Complaint  Patient presents with  . Urinary Retention      HPI Pt was seen at [place]. Per pt and her daughter, c/o sudden onset and persistence of constant "foley bag leaking" that began PTA. Pt's daughter states the end piece of the bag will not stay tight and has been leaking. Pt's daughter states pt's foley also "stopped draining and then it drained again." Pt is currently being followed by Uro Dr. Diona Fanti for "bladder not draining fully." Denies any other complaints. Denies hematuria, no back/flank pain, no fevers, no abd pain, no N/V/D.     Past Medical History  Diagnosis Date  . COPD (chronic obstructive pulmonary disease) ([place])   . Arthritis   . Depression   . Memory difficulties   . Chronic diarrhea   . Hiatal hernia   . Cancer [facility])     Skin cancer  . Hyperlipidemia   . Myocardial infarction [facility])     unknown time   . Shortness of breath     with exertion  . Seizures ([place])     x 2 last year due to bleeding to the brain.  . Stroke [facility])     TIA four years ago / stroke NOV 2014  . Bruises easily   . GERD (gastroesophageal reflux disease)   . Microscopic colitis   . Colitis   . Hydronephrosis, left   . Hx of septic shock JULY 2015  . Dementia   . Anxiety   . Hx of pulmonary edema JULY 2015  . Attention to nephrostomy ([place])     PT HAS NEPHROSTOMY TUBE IN PLACE  . Hx of bronchitis   . Hx of pulmonary edema JULY 2015   Past Surgical History  Procedure Laterality Date  . Appendectomy    . Skin cancer excision    . Tubal ligation    . Colonoscopy N/A 05/24/2013    Procedure: COLONOSCOPY;  Surgeon: Rogene Houston, MD;  [facility];  Service: Endoscopy;  Laterality: N/A;  100  . Esophagogastroduodenoscopy N/A 05/24/2013    Procedure: ESOPHAGOGASTRODUODENOSCOPY (EGD);  Surgeon: Rogene Houston, MD;  Location: [facility];   Service: Endoscopy;  Laterality: N/A;  . Tonsillectomy    . Cystoscopy with retrograde pyelogram, ureteroscopy and stent placement Left 09/18/2013    Procedure: CYSTOSCOPY WITH RETROGRADE PYELOGRAM,  URETEROSCOPY AND  STENT PLACEMENT, removal of nephrostomy tube;  Surgeon: Jorja Loa, MD;  Location: [facility] ORS;  Service: Urology;  Laterality: Left;  . Breast biopsy Right 02/02/2014    Procedure: RIGHT BREAST BIOPSY;  Surgeon: Jamesetta So, MD;  Location: [facility] ORS;  Service: General;  Laterality: Right;  . Cystoscopy with stent placement Left 09/04/2014    Procedure: CYSTOSCOPY WITH LEFT J2 STENT EXCHANGE;  Surgeon: Franchot Gallo, MD;  Location: [facility] ORS;  Service: Urology;  Laterality: Left;   Family History  Problem Relation Age of Onset  . Diabetes Mother   . Stroke Mother    Social History  Substance Use Topics  . Smoking status: Former Smoker -- 1.00 packs/day for 50 years    Types: Cigarettes    Quit date: 11/13/1997  . Smokeless tobacco: Never Used  . Alcohol Use: 4.2 oz/week    7 Glasses of wine per week     Comment: 2 glasses a night wine (  none in a year)    Review of Systems ROS: Statement: All systems negative except as marked or noted in the HPI; Constitutional: Negative for fever and chills. ; ; Eyes: Negative for eye pain, redness and discharge. ; ; ENMT: Negative for ear pain, hoarseness, nasal congestion, sinus pressure and sore throat. ; ; Cardiovascular: Negative for chest pain, palpitations, diaphoresis, dyspnea and peripheral edema. ; ; Respiratory: Negative for cough, wheezing and stridor. ; ; Gastrointestinal: Negative for nausea, vomiting, diarrhea, abdominal pain, blood in stool, hematemesis, jaundice and rectal bleeding. . ; ; Genitourinary: +foley bag leaking. Negative for dysuria, flank pain and hematuria. ; ; Musculoskeletal: Negative for back pain and neck pain. Negative for swelling and trauma.; ; Skin: Negative for pruritus, rash, abrasions, blisters,  bruising and skin lesion.; ; Neuro: Negative for headache, lightheadedness and neck stiffness. Negative for weakness, altered level of consciousness, altered mental status, extremity weakness, paresthesias, involuntary movement, seizure and syncope.      Allergies  Penicillins  Home Medications   Prior to Admission medications   Medication Sig Start Date End Date Taking? Authorizing Provider  albuterol (PROVENTIL HFA;VENTOLIN HFA) 108 (90 BASE) MCG/ACT inhaler Inhale 2 puffs into the lungs every 6 (six) hours as needed for wheezing or shortness of breath. 08/03/13   Janece Canterbury, MD  atorvastatin (LIPITOR) 80 MG tablet Take 1 tablet (80 mg total) by mouth daily at 6 PM. 08/03/13   Janece Canterbury, MD  budesonide (ENTOCORT EC) 3 MG 24 hr capsule Take 6 mg by mouth daily after breakfast. 03/20/15   Historical Provider, MD  budesonide-formoterol (SYMBICORT) 160-4.5 MCG/ACT inhaler Inhale 2 puffs into the lungs 2 (two) times daily. 06/13/14   Samuella Cota, MD  calcium carbonate (TUMS - DOSED IN MG ELEMENTAL CALCIUM) 500 MG chewable tablet Chew 1 tablet by mouth as needed for indigestion or heartburn.    Historical Provider, MD  ciprofloxacin (CIPRO) 500 MG tablet Take 1 tablet (500 mg total) by mouth every 12 (twelve) hours. 06/20/15   [name], MD  citalopram (CELEXA) 10 MG tablet TAKE 1 TABLET (10 MG TOTAL) BY MOUTH DAILY. 06/11/15   Garvin Fila, MD  Inulin (FIBER CHOICE) 1.5 g CHEW Chew 1 tablet by mouth 2 (two) times daily. 06/18/15   Rogene Houston, MD  levETIRAcetam (KEPPRA) 250 MG tablet Take 1 tablet (250 mg total) by mouth 2 (two) times daily. 03/20/15   Ward Givens, NP  loperamide (IMODIUM) 2 MG capsule Take 1 capsule (2 mg total) by mouth daily before breakfast. 04/09/15   Rogene Houston, MD  LORazepam (ATIVAN) 1 MG tablet 1 mg at bedtime. Per daughter only as needed for sleep. 05/16/15   Historical Provider, MD  Methylfol-Algae-B12-Acetylcyst (CEREFOLIN NAC) 6-90.314-2-600 MG  TABS Take 1 tablet by mouth daily. 10/18/14   Garvin Fila, MD  pantoprazole (PROTONIX) 40 MG tablet TAKE 1 TABLET BY MOUTH EVERY MORNING WITH BREAKFAST 09/11/14   Butch Penny, NP  solifenacin (VESICARE) 5 MG tablet Take 5 mg by mouth daily.    Historical Provider, MD  SPIRIVA HANDIHALER 18 MCG inhalation capsule Place 1 capsule into inhaler and inhale daily. 05/20/14   Historical Provider, MD   BP 105/64 mmHg  Pulse 88  Temp(Src) 98.8 F (37.1 C) (Oral)  Resp 20  Ht 5' (1.524 m)  Wt 98 lb (44.453 kg)  BMI 19.14 kg/m2  SpO2 96% Physical Exam 0150: Physical examination:  Nursing notes reviewed; Vital signs and O2 SAT reviewed;  Constitutional: Well developed, Well nourished, Well hydrated, In no acute distress; Head:  Normocephalic, atraumatic; Eyes: EOMI, PERRL, No scleral icterus; ENMT: Mouth and pharynx normal, Mucous membranes moist; Neck: Supple, Full range of motion, No lymphadenopathy; Cardiovascular: Regular rate and rhythm, No gallop; Respiratory: Breath sounds clear & equal bilaterally, No wheezes.  Speaking full sentences with ease, Normal respiratory effort/excursion; Chest: Nontender, Movement normal; Abdomen: Soft, Nontender, Nondistended, Normal bowel sounds; Genitourinary: No CVA tenderness. +foley catheter in place draining clear/yellow urine with clear/yellow urine in bag.; Extremities: Pulses normal, No tenderness, No edema, No calf edema or asymmetry.; Neuro: AA&Ox3, Major CN grossly intact.  Speech clear. No gross focal motor or sensory deficits in extremities.; Skin: Color normal, Warm, Dry.   ED Course  Procedures (including critical care time) Labs Review  Imaging Review  I have personally reviewed and evaluated these images and lab results as part of my medical decision-making.   EKG Interpretation None      MDM  MDM Reviewed: previous chart, nursing note and vitals      0230:  Foley bag changed; urine draining freely. Bladder scan without urine in  bladder. Pt's daughter reassured. Pt states she is ready to go home now. Dx and testing d/w pt and family.  Questions answered.  Verb understanding, agreeable to d/c home with outpt f/u.   Francine Graven, DO 06/27/15 [phone]

## 2015-06-23 NOTE — ED Notes (Signed)
Pt and daughter here for complaint of leaking leg bag- daughter states that the end piece will not stay tight- bag replaced, Dr Thurnell Garbe in to assess- catheter draining urine at this time

## 2015-06-24 ENCOUNTER — Encounter: Payer: Self-pay | Admitting: Adult Health

## 2015-06-24 ENCOUNTER — Ambulatory Visit (INDEPENDENT_AMBULATORY_CARE_PROVIDER_SITE_OTHER): Payer: Medicare Other | Admitting: Adult Health

## 2015-06-24 ENCOUNTER — Other Ambulatory Visit (INDEPENDENT_AMBULATORY_CARE_PROVIDER_SITE_OTHER): Payer: Self-pay

## 2015-06-24 VITALS — BP 106/60 | HR 88 | Ht 60.0 in | Wt 98.8 lb

## 2015-06-24 DIAGNOSIS — R569 Unspecified convulsions: Secondary | ICD-10-CM

## 2015-06-24 DIAGNOSIS — Z5181 Encounter for therapeutic drug level monitoring: Secondary | ICD-10-CM

## 2015-06-24 DIAGNOSIS — Z0289 Encounter for other administrative examinations: Secondary | ICD-10-CM

## 2015-06-24 DIAGNOSIS — R413 Other amnesia: Secondary | ICD-10-CM

## 2015-06-24 NOTE — Progress Notes (Signed)
PATIENT: Bridget Ball DOB: 1928-08-30  REASON FOR VISIT: follow up- memory HISTORY FROM: patient  HISTORY OF PRESENT ILLNESS: Today 06/24/2015: Bridget Ball is an 82 year old female with a history of subarachnoid hemorrhage in 2014, seizures and memory impairment. She returns today for an evaluation. Her family reports that her memory has recently started to decline. They've noticed that she is more repetitive. She is able to complete all ADLs independently. She typically lives at home alone however her daughter recently had neck surgery since she moved in with her temporarily. The patient also has a history of a kidney stent. She reports it is malfunctioning and will need to be replaced. She is also been having bladder retention which caused a urinary tract infection. She just finished with her antibiotic. She is currently taking Cerefolin. However her family is interested in trying a different medication since they have noticed a decline in her memory. She continues on Keppra for seizure prevention. She's not had any additional seizure events. She returns today for an evaluation.  HISTORY  UDPATE 03/20/15: Bridget Ball is an 82 year old female with a history of subarachnoid hemorrhage in 2014, seizures and memory impairment. She returns today for an evaluation. The patient reports that her memory has remained stable. She is currently on Cerefolin. She states that she lives at home alone. She is able to complete all ADLs independently. She manages her own finances without difficulty. She is able to prepare her own meals without difficulty. Denies having to give up anything due to memory. The patient was called about the Cread study however she was not interested. The patient reports that she's not had any seizure episodes. She has continued on Keppra. She states about a month and a half ago she had a flareup with her colitis and has severe diarrhea. She became dehydrated and hypotensive. During that  time she had numbness in the left hand and arm-- her primary care contributed to her blood pressure. She reports that since her blood pressure has normalized she has not had these symptoms. She returns today for an evaluation.  HISTORY ([name]): Initial Consult 01/24/2013 ; 80 year Caucasian lady seen for followup following hospital admission on 11/19/12 for subarachnoid hemorrhage and seizures. She presented with recurrent transient episodes of left body paresthesias and difficulty speaking. The episode began in the left arm and gradually over minutes progressed to involve the face and lasted several minutes only. At times she was unable to form words and speak but she could understand what other people were saying. She described this as a feeling of pins and needles but without any impairment of consciousness. She had similar episodes in 2010 when she had a similar localized right frontal convexity subarachnoid hemorrhage. She had had no head trauma or significant headaches or had bumped her head. And at times MRI scan had shown similar findings to the current one which showed localized blood in the right frontal convexity with some diffusion positive changes suggesting microinfarction adjacent. She previously has cerebral catheter angiogram performed which showed no evidence of underlying AVM, aneurysms or vascular abnormalities. She was started on Neurontin for seizures and did well for a while but then gradually decided to stop the medication she did follow up with me for 1 year but subsequently was lost to followup. She had EEG during the previous admission and 2010 which did not show definite seizure activity.She had a repeat EEG on 11/21/12 which was normal. CT angiogram of the brain on 11/16/14and showed  no evidence of underlying aneurysm or vascular abnormality. Urine drug screen was negative. Patient was started on Keppra initially 500 twice a day subsequent when episodes persisted was increased to  thousand twice a day and Depakote ER was added. Patient states she's did well since discharge and has not had recurrent episodes of numbness however she has had confusion and agitation issues and hence has stopped the Depakote and in fact has even reduce the Keppra to 500 twice daily. Patient complains of short-term problems as well as intermittent confusion, balance issues. She forgets how to use a credit card and daughter has to help her in on several occasions. She previously had home so [place] which appear to be healing now and she does see a home care nurse twice a week. She hasn't never been diagnosed in the past with depression or been on any medication for that. She denies headache, focal extremity weakness, numbness or vision problems.  Update 03/21/2014 : She returns for follow-up today after last visit 6 months ago. She was started on Patch at last visit but did not tolerate it well due to behavioral outbursts and it was discontinued. She was hospitalized with kidney infection and septic shock and almost died. She underwent kidney stent and had a [facility]. She is doing a lot better now. She does take Cerefolin every day which seems to help. When she ran out of Cerefolin did notice cognitive worsening. She also is on Celexa for depression which keeps her mood stable. She has no new complaints today. On Mini-Mental status exam today she scored 30/30 which is an improvement from 27/30 at last visit. She has not had any seizures and is tolerating Keppra well without side effects Update 10/03/2014 : She returns for follow-up after last visit 6 months ago. Physical and my daughter. She states her short-term memory and cognitive difficulties about unchanged. She lives alone she is independent in all activities of daily living. She in fact takes her medications. She states her balance is good she's not had any recent falls. There have been no episodes of confusion,  delusional hallucinations or agitation noted. She continues to do Cerefolin NAC. She does read the but does not per spend time in doing cognitively challenging activities routinely.  REVIEW OF SYSTEMS: Out of a complete 14 system review of symptoms, the patient complains only of the following symptoms, and all other reviewed systems are negative.  Painful urination, incontinence of bladder, frequency of urination, memory loss, dizziness, numbness, confusion, decreased concentration, nervous/anxious, frequent waking, snoring, sleep talking, incontinence of bowels, diarrhea, shortness of breath, runny nose, hearing loss, fatigue, appetite change, activity change  ALLERGIES: Allergies  Allergen Reactions  . Penicillins Anaphylaxis    HOME MEDICATIONS: Outpatient Prescriptions Prior to Visit  Medication Sig Dispense Refill  . albuterol (PROVENTIL HFA;VENTOLIN HFA) 108 (90 BASE) MCG/ACT inhaler Inhale 2 puffs into the lungs every 6 (six) hours as needed for wheezing or shortness of breath. 1 Inhaler 0  . atorvastatin (LIPITOR) 80 MG tablet Take 1 tablet (80 mg total) by mouth daily at 6 PM. 30 tablet 0  . budesonide (ENTOCORT EC) 3 MG 24 hr capsule Take 6 mg by mouth daily after breakfast.    . budesonide-formoterol (SYMBICORT) 160-4.5 MCG/ACT inhaler Inhale 2 puffs into the lungs 2 (two) times daily. 1 Inhaler 0  . calcium carbonate (TUMS - DOSED IN MG ELEMENTAL CALCIUM) 500 MG chewable tablet Chew 1 tablet by mouth as needed for  indigestion or heartburn.    . ciprofloxacin (CIPRO) 500 MG tablet Take 1 tablet (500 mg total) by mouth every 12 (twelve) hours. 10 tablet 0  . citalopram (CELEXA) 10 MG tablet TAKE 1 TABLET (10 MG TOTAL) BY MOUTH DAILY. 90 tablet 1  . Inulin (FIBER CHOICE) 1.5 g CHEW Chew 1 tablet by mouth 2 (two) times daily.    Marland Kitchen levETIRAcetam (KEPPRA) 250 MG tablet Take 1 tablet (250 mg total) by mouth 2 (two) times daily. 60 tablet 11  . loperamide (IMODIUM) 2 MG capsule Take 1  capsule (2 mg total) by mouth daily before breakfast. 30 capsule 0  . LORazepam (ATIVAN) 1 MG tablet 1 mg at bedtime. Per daughter only as needed for sleep.    . Methylfol-Algae-B12-Acetylcyst (CEREFOLIN NAC) 6-90.314-2-600 MG TABS Take 1 tablet by mouth daily. 90 tablet 3  . pantoprazole (PROTONIX) 40 MG tablet TAKE 1 TABLET BY MOUTH EVERY MORNING WITH BREAKFAST 30 tablet 5  . solifenacin (VESICARE) 5 MG tablet Take 5 mg by mouth daily.    Marland Kitchen SPIRIVA HANDIHALER 18 MCG inhalation capsule Place 1 capsule into inhaler and inhale daily.  1   No facility-administered medications prior to visit.    PAST MEDICAL HISTORY: Past Medical History  Diagnosis Date  . COPD (chronic obstructive pulmonary disease) ([place])   . Arthritis   . Depression   . Memory difficulties   . Chronic diarrhea   . Hiatal hernia   . Cancer [facility])     Skin cancer  . Hyperlipidemia   . Myocardial infarction [facility])     unknown time   . Shortness of breath     with exertion  . Seizures ([place])     x 2 last year due to bleeding to the brain.  . Stroke [facility])     TIA four years ago / stroke NOV 2014  . Bruises easily   . GERD (gastroesophageal reflux disease)   . Microscopic colitis   . Colitis   . Hydronephrosis, left   . Hx of septic shock JULY 2015  . Dementia   . Anxiety   . Hx of pulmonary edema JULY 2015  . Attention to nephrostomy ([place])     PT HAS NEPHROSTOMY TUBE IN PLACE  . Hx of bronchitis   . Hx of pulmonary edema JULY 2015    PAST SURGICAL HISTORY: Past Surgical History  Procedure Laterality Date  . Appendectomy    . Skin cancer excision    . Tubal ligation    . Colonoscopy N/A 05/24/2013    Procedure: COLONOSCOPY;  Surgeon: Rogene Houston, MD;  [facility];  Service: Endoscopy;  Laterality: N/A;  100  . Esophagogastroduodenoscopy N/A 05/24/2013    Procedure: ESOPHAGOGASTRODUODENOSCOPY (EGD);  Surgeon: Rogene Houston, MD;  [facility];  Service: Endoscopy;  Laterality: N/A;   . Tonsillectomy    . Cystoscopy with retrograde pyelogram, ureteroscopy and stent placement Left 09/18/2013    Procedure: CYSTOSCOPY WITH RETROGRADE PYELOGRAM,  URETEROSCOPY AND  STENT PLACEMENT, removal of nephrostomy tube;  Surgeon: Jorja Loa, MD;  Location: [facility];  Service: Urology;  Laterality: Left;  . Breast biopsy Right 02/02/2014    Procedure: RIGHT BREAST BIOPSY;  Surgeon: Jamesetta So, MD;  Location: [facility] ORS;  Service: General;  Laterality: Right;  . Cystoscopy with stent placement Left 09/04/2014    Procedure: CYSTOSCOPY WITH LEFT J2 STENT EXCHANGE;  Surgeon: Franchot Gallo, MD;  Location: [facility] OR[facility];  Service: Urology;  Laterality: Left;    FAMILY HISTORY: Family History  Problem Relation Age of Onset  . Diabetes Mother   . Stroke Mother     SOCIAL HISTORY: Social History   Social History  . Marital Status: Widowed    Spouse Name: N/A  . Number of Children: 6  . Years of Education: college   Occupational History  . retired    Social History Main Topics  . Smoking status: Former Smoker -- 1.00 packs/day for 50 years    Types: Cigarettes    Quit date: 11/13/1997  . Smokeless tobacco: Never Used  . Alcohol Use: 4.2 oz/week    7 Glasses of wine per week     Comment: 2 glasses a night wine (none in a year)  . Drug Use: No  . Sexual Activity: Not Currently   Other Topics Concern  . Not on file   Social History Narrative   Patient is widowed with 6 children.   Patient is right handed.   Patient has college education.   Patient drinks 4 cups daily.      PHYSICAL EXAM  Filed Vitals:   06/24/15 1134  BP: 106/60  Pulse: 88  Height: 5' (1.524 m)  Weight: 98 lb 12.8 oz (44.815 kg)   Body mass index is 19.3 kg/(m^2). MMSE - Mini Mental State Exam 06/24/2015 03/20/2015 03/21/2014  Orientation to time 2 4 5   Orientation to Place 5 3 5   Registration 3 3 3   Attention/ Calculation 3 5 5   Recall 1 2 3   Language- name 2 objects 2 2 2   Language- repeat  1 1 1   Language- follow 3 step command 3 3 3   Language- read & follow direction 1 1 1   Write a sentence 1 1 1   Copy design 1 1 1   Total score 23 26 30     Generalized: Well developed, in no acute distress   Neurological examination  Mentation: Alert. Follows all commands speech and language fluent Cranial nerve II-XII: Pupils were equal round reactive to light. Extraocular movements were full, visual field were full on confrontational test. Facial sensation and strength were normal. Uvula tongue midline. Head turning and shoulder shrug  were normal and symmetric. Motor: The motor testing reveals 5 over 5 strength of all 4 extremities. Good symmetric motor tone is noted throughout.  Sensory: Sensory testing is intact to soft touch on all 4 extremities. No evidence of extinction is noted.  Coordination: Cerebellar testing reveals good finger-nose-finger and heel-to-shin bilaterally.  Gait and station: Patient uses a cane when ambulating..  Reflexes: Deep tendon reflexes are symmetric and normal bilaterally.   DIAGNOSTIC DATA (LABS, IMAGING, TESTING) - I reviewed patient records, labs, notes, testing and imaging myself where available.  Lab Results  Component Value Date   WBC 6.0 08/31/2014   HGB 12.3 08/31/2014   HCT 37.1 08/31/2014   MCV 98.4 08/31/2014   PLT 208 08/31/2014      Component Value Date/Time   NA 137 08/31/2014 1405   K 4.6 08/31/2014 1405   CL 103 08/31/2014 1405   CO2 28 08/31/2014 1405   GLUCOSE 95 08/31/2014 1405   BUN 13 08/31/2014 1405   CREATININE 0.89 08/31/2014 1405   CALCIUM 9.3 08/31/2014 1405   PROT 6.6 06/12/2014 0445   ALBUMIN 3.9 06/12/2014 0445   AST 21 06/12/2014 0445   ALT 17 06/12/2014 0445   ALKPHOS 49 06/12/2014 0445   BILITOT 0.4 06/12/2014 0445   GFRNONAA 58* 08/31/2014 1405  GFRAA >60 08/31/2014 1405          ASSESSMENT AND PLAN 82 y.o. year old female  has a past medical history of COPD (chronic obstructive pulmonary  disease) ([place]); Arthritis; Depression; Memory difficulties; Chronic diarrhea; Hiatal hernia; Cancer ([place]); Hyperlipidemia; Myocardial infarction ([place]); Shortness of breath; Seizures ([place]); Stroke [facility]); Bruises easily; GERD (gastroesophageal reflux disease); Microscopic colitis; Colitis; Hydronephrosis, left; septic shock (JULY 2015); Dementia; Anxiety; pulmonary edema (JULY 2015); Attention to nephrostomy ([place]); bronchitis; and pulmonary edema (JULY 2015). here with:  1. Memory disturbance 2. Seizures  The patient's memory score has slightly declined. MMSE is 23/30 was previously 26/30. The patient's decline in her memory score could be due to a change in her environment as well as recent UTI and bladder retention. However the family would like her to start on different memory medication. I have recommended Namenda. However I would like to check her kidney function before initiating this medication. The family is not aware of any abnormal kidney function. I have reviewed the side effects with the patient and her family. Advised that if her symptoms worsen or she develops any new symptoms she she'll let us know. She will follow-up in 3 months or sooner if needed.    Ward Givens, MSN, NP-C 06/24/2015, 11:56 AM [facility] [address], [place], [place] [address] [phone]

## 2015-06-24 NOTE — Patient Instructions (Signed)
Check blood work today Will try namenda after blood work  Memantine Tablets What is this medicine? MEMANTINE (MEM an teen) is used to treat dementia caused by Alzheimer's disease. This medicine may be used for other purposes; ask your health care provider or pharmacist if you have questions. What should I tell my health care provider before I take this medicine? They need to know if you have any of these conditions: -difficulty passing urine -kidney disease -liver disease -seizures -an unusual or allergic reaction to memantine, other medicines, foods, dyes, or preservatives -pregnant or trying to get pregnant -breast-feeding How should I use this medicine? Take this medicine by mouth with a glass of water. Follow the directions on the prescription label. You may take this medicine with or without food. Take your doses at regular intervals. Do not take your medicine more often than directed. Continue to take your medicine even if you feel better. Do not stop taking except on the advice of your doctor or health care professional. Talk to your pediatrician regarding the use of this medicine in children. Special care may be needed. Overdosage: If you think you have taken too much of this medicine contact a poison control center or emergency room at once. NOTE: This medicine is only for you. Do not share this medicine with others. What if I miss a dose? If you miss a dose, take it as soon as you can. If it is almost time for your next dose, take only that dose. Do not take double or extra doses. If you do not take your medicine for several days, contact your health care provider. Your dose may need to be changed. What may interact with this medicine? -acetazolamide -amantadine -cimetidine -dextromethorphan -dofetilide -hydrochlorothiazide -ketamine -metformin -methazolamide -quinidine -ranitidine -sodium bicarbonate -triamterene This list may not describe all possible interactions.  Give your health care provider a list of all the medicines, herbs, non-prescription drugs, or dietary supplements you use. Also tell them if you smoke, drink alcohol, or use illegal drugs. Some items may interact with your medicine. What should I watch for while using this medicine? Visit your doctor or health care professional for regular checks on your progress. Check with your doctor or health care professional if there is no improvement in your symptoms or if they get worse. You may get drowsy or dizzy. Do not drive, use machinery, or do anything that needs mental alertness until you know how this drug affects you. Do not stand or sit up quickly, especially if you are an older patient. This reduces the risk of dizzy or fainting spells. Alcohol can make you more drowsy and dizzy. Avoid alcoholic drinks. What side effects may I notice from receiving this medicine? Side effects that you should report to your doctor or health care professional as soon as possible: -allergic reactions like skin rash, itching or hives, swelling of the face, lips, or tongue -agitation or a feeling of restlessness -depressed mood -dizziness -hallucinations -redness, blistering, peeling or loosening of the skin, including inside the mouth -seizures -vomiting Side effects that usually do not require medical attention (report to your doctor or health care professional if they continue or are bothersome): -constipation -diarrhea -headache -nausea -trouble sleeping This list may not describe all possible side effects. Call your doctor for medical advice about side effects. You may report side effects to FDA at 1-800-FDA-1088. Where should I keep my medicine? Keep out of the reach of children. Store at room temperature between 15 degrees and 30  degrees C (59 degrees and 86 degrees F). Throw away any unused medicine after the expiration date. NOTE: This sheet is a summary. It may not cover all possible information. If  you have questions about this medicine, talk to your doctor, pharmacist, or health care provider.    2016, Elsevier/Gold Standard. (2012-10-10 14:10:42)

## 2015-06-24 NOTE — Progress Notes (Signed)
I agree with the assessment and plan as directed by NP .The patient is known to me .   Leslieann Whisman, MD  

## 2015-06-25 ENCOUNTER — Telehealth: Payer: Self-pay | Admitting: Adult Health

## 2015-06-25 ENCOUNTER — Ambulatory Visit (HOSPITAL_COMMUNITY)
Admission: RE | Admit: 2015-06-25 | Discharge: 2015-06-25 | Disposition: A | Discharge status: Home / Self Care | Payer: Medicare Other | Source: Ambulatory Visit | Attending: Urology | Admitting: Urology

## 2015-06-25 DIAGNOSIS — N1330 Unspecified hydronephrosis: Secondary | ICD-10-CM | POA: Insufficient documentation

## 2015-06-25 LAB — COMPREHENSIVE METABOLIC PANEL
ALBUMIN: 4 g/dL (ref 3.5–4.7)
ALT: 18 IU/L (ref 0–32)
AST: 18 IU/L (ref 0–40)
Albumin/Globulin Ratio: 1.8 (ref 1.2–2.2)
Alkaline Phosphatase: 72 IU/L (ref 39–117)
BUN / CREAT RATIO: 16 (ref 12–28)
BUN: 16 mg/dL (ref 8–27)
Bilirubin Total: 0.3 mg/dL (ref 0.0–1.2)
CO2: 22 mmol/L (ref 18–29)
CREATININE: 0.97 mg/dL (ref 0.57–1.00)
Calcium: 9.5 mg/dL (ref 8.7–10.3)
Chloride: 102 mmol/L (ref 96–106)
GFR calc non Af Amer: 54 mL/min/{1.73_m2} — ABNORMAL LOW (ref >59)
GFR, EST AFRICAN AMERICAN: 62 mL/min/{1.73_m2} (ref >59)
GLUCOSE: 92 mg/dL (ref 65–99)
Globulin, Total: 2.2 g/dL (ref 1.5–4.5)
Potassium: 4.5 mmol/L (ref 3.5–5.2)
Sodium: 142 mmol/L (ref 134–144)
TOTAL PROTEIN: 6.2 g/dL (ref 6.0–8.5)

## 2015-06-25 MED ORDER — MEMANTINE HCL 28 X 5 MG & 21 X 10 MG PO TABS: ORAL_TABLET | ORAL | Status: DC

## 2015-06-25 NOTE — Telephone Encounter (Signed)
I called the daughter. Lab work was okay. I will start the patient on a Namenda titration pack.

## 2015-06-26 ENCOUNTER — Other Ambulatory Visit: Payer: Self-pay | Admitting: Adult Health

## 2015-06-26 MED ORDER — MEMANTINE HCL 5 MG PO TABS: ORAL_TABLET | ORAL | Status: DC

## 2015-06-26 NOTE — Telephone Encounter (Signed)
I faxed prescription for memantine 5mg  tabs (to use in titration) then will switch to 10mg  tablets.  Fax confirmation received [phone] [place].

## 2015-06-26 NOTE — Telephone Encounter (Signed)
Tammy/[facility] [phone] called said insurance will not pay for memantine (NAMENDA TITRATION PAK) tablet pack . It will pay for the gen memantine. She said the gen can be called in and they can put the directions on the bottle. Please call

## 2015-07-02 ENCOUNTER — Ambulatory Visit (INDEPENDENT_AMBULATORY_CARE_PROVIDER_SITE_OTHER): Payer: Medicare Other | Admitting: Urology

## 2015-07-02 DIAGNOSIS — N1330 Unspecified hydronephrosis: Secondary | ICD-10-CM | POA: Diagnosis not present

## 2015-07-02 DIAGNOSIS — R339 Retention of urine, unspecified: Secondary | ICD-10-CM | POA: Diagnosis not present

## 2015-07-08 ENCOUNTER — Ambulatory Visit (HOSPITAL_COMMUNITY): Payer: Medicare Other | Admitting: Physical Therapy

## 2015-07-11 ENCOUNTER — Ambulatory Visit (HOSPITAL_COMMUNITY): Payer: Medicare Other

## 2015-07-15 ENCOUNTER — Ambulatory Visit (HOSPITAL_COMMUNITY): Payer: Medicare Other | Admitting: Physical Therapy

## 2015-07-16 ENCOUNTER — Ambulatory Visit (INDEPENDENT_AMBULATORY_CARE_PROVIDER_SITE_OTHER): Payer: Medicare Other | Admitting: Urology

## 2015-07-16 ENCOUNTER — Other Ambulatory Visit (HOSPITAL_COMMUNITY)
Admission: RE | Admit: 2015-07-16 | Discharge: 2015-07-16 | Disposition: A | Discharge status: Home / Self Care | Payer: Medicare Other | Source: Ambulatory Visit | Attending: Urology | Admitting: Urology

## 2015-07-16 DIAGNOSIS — R3914 Feeling of incomplete bladder emptying: Secondary | ICD-10-CM | POA: Diagnosis not present

## 2015-07-16 DIAGNOSIS — N1330 Unspecified hydronephrosis: Secondary | ICD-10-CM

## 2015-07-16 LAB — URINALYSIS, ROUTINE W REFLEX MICROSCOPIC
Bilirubin Urine: NEGATIVE
GLUCOSE, UA: NEGATIVE mg/dL
Ketones, ur: NEGATIVE mg/dL
Nitrite: POSITIVE — AB
PROTEIN: NEGATIVE mg/dL
Specific Gravity, Urine: <1.005 — ABNORMAL LOW (ref 1.005–1.030)
pH: 6 (ref 5.0–8.0)

## 2015-07-16 LAB — URINE MICROSCOPIC-ADD ON

## 2015-07-17 ENCOUNTER — Other Ambulatory Visit (HOSPITAL_COMMUNITY)
Admission: RE | Admit: 2015-07-17 | Discharge: 2015-07-17 | Disposition: A | Discharge status: Home / Self Care | Payer: Medicare Other | Source: Ambulatory Visit | Attending: Urology | Admitting: Urology

## 2015-07-17 DIAGNOSIS — Z87440 Personal history of urinary (tract) infections: Secondary | ICD-10-CM | POA: Diagnosis not present

## 2015-07-18 ENCOUNTER — Ambulatory Visit (HOSPITAL_COMMUNITY): Payer: Medicare Other | Admitting: Physical Therapy

## 2015-07-18 LAB — URINE CULTURE

## 2015-07-25 ENCOUNTER — Other Ambulatory Visit: Payer: Self-pay

## 2015-07-25 DIAGNOSIS — N201 Calculus of ureter: Secondary | ICD-10-CM

## 2015-07-31 NOTE — Patient Instructions (Signed)
Bridget Ball  07/31/2015     @PREFPERIOPPHARMACY @   Your procedure is scheduled on  08/06/2015   Report to [facility] at  60  A.M.  Call this number if you have problems the morning of surgery:  [phone]   Remember:  Do not eat food or drink liquids after midnight.  Take these medicines the morning of surgery with A SIP OF WATER  Celexa, keppra, ativan, namenda, protonix, vesicare. Take your inhalers before you come.   Do not wear jewelry, make-up or nail polish.  Do not wear lotions, powders, or perfumes.  You may wear deoderant.  Do not shave 48 hours prior to surgery.  Men may shave face and neck.  Do not bring valuables to the hospital.  [facility] is not responsible for any belongings or valuables.  Contacts, dentures or bridgework may not be worn into surgery.  Leave your suitcase in the car.  After surgery it may be brought to your room.  For patients admitted to the hospital, discharge time will be determined by your treatment team.  Patients discharged the day of surgery will not be allowed to drive home.   Name and phone number of your driver:   family Special instructions:  none  Please read over the following fact sheets that you were given. Coughing and Deep Breathing, Surgical Site Infection Prevention, Anesthesia Post-op Instructions and Care and Recovery After Surgery       Cystoscopy Cystoscopy is a procedure that is used to help your caregiver diagnose and sometimes treat conditions that affect your lower urinary tract. Your lower urinary tract includes your bladder and the tube through which urine passes from your bladder out of your body (urethra). Cystoscopy is performed with a thin, tube-shaped instrument (cystoscope). The cystoscope has lenses and a light at the end so that your caregiver can see inside your bladder. The cystoscope is inserted at the entrance of your urethra. Your caregiver guides it through your urethra and into  your bladder. There are two main types of cystoscopy:  Flexible cystoscopy (with a flexible cystoscope).  Rigid cystoscopy (with a rigid cystoscope). Cystoscopy may be recommended for many conditions, including:  Urinary tract infections.  Blood in your urine (hematuria).  Loss of bladder control (urinary incontinence) or overactive bladder.  Unusual cells found in a urine sample.  Urinary blockage.  Painful urination. Cystoscopy may also be done to remove a sample of your tissue to be checked under a microscope (biopsy). It may also be done to remove or destroy bladder stones. LET YOUR CAREGIVER KNOW ABOUT:  Allergies to food or medicine.  Medicines taken, including vitamins, herbs, eyedrops, over-the-counter medicines, and creams.  Use of steroids (by mouth or creams).  Previous problems with anesthetics or numbing medicines.  History of bleeding problems or blood clots.  Previous surgery.  Other health problems, including diabetes and kidney problems.  Possibility of pregnancy, if this applies. PROCEDURE The area around the opening to your urethra will be cleaned. A medicine to numb your urethra (local anesthetic) is used. If a tissue sample or stone is removed during the procedure, you may be given a medicine to make you sleep (general anesthetic). Your caregiver will gently insert the tip of the cystoscope into your urethra. The cystoscope will be slowly glided through your urethra and into your bladder. Sterile fluid will flow through the cystoscope and into your bladder. The fluid will expand and stretch  your bladder. This gives your caregiver a better view of your bladder walls. The procedure lasts about 15-20 minutes. AFTER THE PROCEDURE If a local anesthetic is used, you will be allowed to go home as soon as you are ready. If a general anesthetic is used, you will be taken to a recovery area until you are stable. You may have temporary bleeding and burning on  urination.   This information is not intended to replace advice given to you by your health care provider. Make sure you discuss any questions you have with your health care provider.   Document Released: 12/20/1999 Document Revised: 01/12/2014 Document Reviewed: 06/15/2011 Elsevier Interactive Patient Education 2016 [place]. Cystoscopy, Care After Refer to this sheet in the next few weeks. These instructions provide you with information on caring for yourself after your procedure. Your caregiver may also give you more specific instructions. Your treatment has been planned according to current medical practices, but problems sometimes occur. Call your caregiver if you have any problems or questions after your procedure. HOME CARE INSTRUCTIONS  Things you can do to ease any discomfort after your procedure include:  Drinking enough water and fluids to keep your urine clear or pale yellow.  Taking a warm bath to relieve any burning feelings. SEEK IMMEDIATE MEDICAL CARE IF:   You have an increase in blood in your urine.  You notice blood clots in your urine.  You have difficulty passing urine.  You have the chills.  You have abdominal pain.  You have a fever or persistent symptoms for more than 2-3 days.  You have a fever and your symptoms suddenly get worse. MAKE SURE YOU:   Understand these instructions.  Will watch your condition.  Will get help right away if you are not doing well or get worse.   This information is not intended to replace advice given to you by your health care provider. Make sure you discuss any questions you have with your health care provider.   Document Released: 07/11/2004 Document Revised: 01/12/2014 Document Reviewed: 06/15/2011 Elsevier Interactive Patient Education 2016 Elsevier Inc. PATIENT INSTRUCTIONS POST-ANESTHESIA  IMMEDIATELY FOLLOWING SURGERY:  Do not drive or operate machinery for the first twenty four hours after surgery.  Do not  make any important decisions for twenty four hours after surgery or while taking narcotic pain medications or sedatives.  If you develop intractable nausea and vomiting or a severe headache please notify your doctor immediately.  FOLLOW-UP:  Please make an appointment with your surgeon as instructed. You do not need to follow up with anesthesia unless specifically instructed to do so.  WOUND CARE INSTRUCTIONS (if applicable):  Keep a dry clean dressing on the anesthesia/puncture wound site if there is drainage.  Once the wound has quit draining you may leave it open to air.  Generally you should leave the bandage intact for twenty four hours unless there is drainage.  If the epidural site drains for more than 36-48 hours please call the anesthesia department.  QUESTIONS?:  Please feel free to call your physician or the hospital operator if you have any questions, and they will be happy to assist you.

## 2015-08-01 ENCOUNTER — Encounter (HOSPITAL_COMMUNITY): Payer: Self-pay

## 2015-08-01 ENCOUNTER — Other Ambulatory Visit: Payer: Self-pay

## 2015-08-01 ENCOUNTER — Encounter (HOSPITAL_COMMUNITY)
Admission: RE | Admit: 2015-08-01 | Discharge: 2015-08-01 | Disposition: A | Discharge status: Home / Self Care | Payer: Medicare Other | Source: Ambulatory Visit | Attending: Urology | Admitting: Urology

## 2015-08-01 DIAGNOSIS — Z01812 Encounter for preprocedural laboratory examination: Secondary | ICD-10-CM | POA: Diagnosis not present

## 2015-08-01 DIAGNOSIS — N135 Crossing vessel and stricture of ureter without hydronephrosis: Secondary | ICD-10-CM

## 2015-08-01 LAB — CBC WITH DIFFERENTIAL/PLATELET
Basophils Absolute: 0 10*3/uL (ref 0.0–0.1)
Basophils Relative: 0 %
EOS PCT: 3 %
Eosinophils Absolute: 0.2 10*3/uL (ref 0.0–0.7)
HCT: 34.1 % — ABNORMAL LOW (ref 36.0–46.0)
Hemoglobin: 11.1 g/dL — ABNORMAL LOW (ref 12.0–15.0)
LYMPHS ABS: 0.9 10*3/uL (ref 0.7–4.0)
LYMPHS PCT: 15 %
MCH: 31.4 pg (ref 26.0–34.0)
MCHC: 32.6 g/dL (ref 30.0–36.0)
MCV: 96.6 fL (ref 78.0–100.0)
MONO ABS: 0.5 10*3/uL (ref 0.1–1.0)
MONOS PCT: 9 %
Neutro Abs: 4.3 10*3/uL (ref 1.7–7.7)
Neutrophils Relative %: 73 %
PLATELETS: 203 10*3/uL (ref 150–400)
RBC: 3.53 MIL/uL — AB (ref 3.87–5.11)
RDW: 15.6 % — ABNORMAL HIGH (ref 11.5–15.5)
WBC: 5.9 10*3/uL (ref 4.0–10.5)

## 2015-08-01 LAB — BASIC METABOLIC PANEL
Anion gap: 3 — ABNORMAL LOW (ref 5–15)
BUN: 11 mg/dL (ref 6–20)
CO2: 27 mmol/L (ref 22–32)
Calcium: 8.6 mg/dL — ABNORMAL LOW (ref 8.9–10.3)
Chloride: 109 mmol/L (ref 101–111)
Creatinine, Ser: 0.85 mg/dL (ref 0.44–1.00)
GFR calc Af Amer: >60 mL/min (ref >60)
GLUCOSE: 89 mg/dL (ref 65–99)
POTASSIUM: 3.4 mmol/L — AB (ref 3.5–5.1)
Sodium: 139 mmol/L (ref 135–145)

## 2015-08-01 NOTE — Pre-Procedure Instructions (Signed)
Patient given information to sign up for my chart at home. 

## 2015-08-06 ENCOUNTER — Ambulatory Visit (HOSPITAL_COMMUNITY): Payer: Medicare Other

## 2015-08-06 ENCOUNTER — Encounter (HOSPITAL_COMMUNITY)
Admission: RE | Disposition: A | Discharge status: Home / Self Care | Payer: Self-pay | Source: Ambulatory Visit | Attending: Urology

## 2015-08-06 ENCOUNTER — Ambulatory Visit (HOSPITAL_COMMUNITY): Payer: Medicare Other | Admitting: Anesthesiology

## 2015-08-06 ENCOUNTER — Encounter (HOSPITAL_COMMUNITY): Payer: Self-pay

## 2015-08-06 ENCOUNTER — Ambulatory Visit (HOSPITAL_COMMUNITY)
Admission: RE | Admit: 2015-08-06 | Discharge: 2015-08-06 | Disposition: A | Discharge status: Home / Self Care | Payer: Medicare Other | Source: Ambulatory Visit | Attending: Urology | Admitting: Urology

## 2015-08-06 DIAGNOSIS — J9801 Acute bronchospasm: Secondary | ICD-10-CM

## 2015-08-06 DIAGNOSIS — F329 Major depressive disorder, single episode, unspecified: Secondary | ICD-10-CM | POA: Insufficient documentation

## 2015-08-06 DIAGNOSIS — E860 Dehydration: Secondary | ICD-10-CM

## 2015-08-06 DIAGNOSIS — N136 Pyonephrosis: Secondary | ICD-10-CM | POA: Insufficient documentation

## 2015-08-06 DIAGNOSIS — Z85828 Personal history of other malignant neoplasm of skin: Secondary | ICD-10-CM | POA: Diagnosis not present

## 2015-08-06 DIAGNOSIS — Z79899 Other long term (current) drug therapy: Secondary | ICD-10-CM | POA: Insufficient documentation

## 2015-08-06 DIAGNOSIS — I252 Old myocardial infarction: Secondary | ICD-10-CM | POA: Insufficient documentation

## 2015-08-06 DIAGNOSIS — J449 Chronic obstructive pulmonary disease, unspecified: Secondary | ICD-10-CM | POA: Diagnosis not present

## 2015-08-06 DIAGNOSIS — Z8719 Personal history of other diseases of the digestive system: Secondary | ICD-10-CM | POA: Insufficient documentation

## 2015-08-06 DIAGNOSIS — R6521 Severe sepsis with septic shock: Secondary | ICD-10-CM

## 2015-08-06 DIAGNOSIS — N135 Crossing vessel and stricture of ureter without hydronephrosis: Secondary | ICD-10-CM | POA: Diagnosis not present

## 2015-08-06 DIAGNOSIS — A4150 Gram-negative sepsis, unspecified: Secondary | ICD-10-CM

## 2015-08-06 DIAGNOSIS — K921 Melena: Secondary | ICD-10-CM

## 2015-08-06 DIAGNOSIS — R634 Abnormal weight loss: Secondary | ICD-10-CM

## 2015-08-06 DIAGNOSIS — F0390 Unspecified dementia without behavioral disturbance: Secondary | ICD-10-CM | POA: Diagnosis not present

## 2015-08-06 DIAGNOSIS — R413 Other amnesia: Secondary | ICD-10-CM

## 2015-08-06 DIAGNOSIS — F419 Anxiety disorder, unspecified: Secondary | ICD-10-CM | POA: Diagnosis not present

## 2015-08-06 DIAGNOSIS — N10 Acute tubulo-interstitial nephritis: Secondary | ICD-10-CM

## 2015-08-06 DIAGNOSIS — M1990 Unspecified osteoarthritis, unspecified site: Secondary | ICD-10-CM | POA: Diagnosis not present

## 2015-08-06 DIAGNOSIS — I214 Non-ST elevation (NSTEMI) myocardial infarction: Secondary | ICD-10-CM

## 2015-08-06 DIAGNOSIS — Z8673 Personal history of transient ischemic attack (TIA), and cerebral infarction without residual deficits: Secondary | ICD-10-CM | POA: Insufficient documentation

## 2015-08-06 DIAGNOSIS — Z87891 Personal history of nicotine dependence: Secondary | ICD-10-CM | POA: Diagnosis not present

## 2015-08-06 DIAGNOSIS — Z87448 Personal history of other diseases of urinary system: Secondary | ICD-10-CM | POA: Diagnosis not present

## 2015-08-06 DIAGNOSIS — K449 Diaphragmatic hernia without obstruction or gangrene: Secondary | ICD-10-CM | POA: Diagnosis not present

## 2015-08-06 DIAGNOSIS — F32A Depression, unspecified: Secondary | ICD-10-CM

## 2015-08-06 DIAGNOSIS — E785 Hyperlipidemia, unspecified: Secondary | ICD-10-CM | POA: Diagnosis not present

## 2015-08-06 DIAGNOSIS — K529 Noninfective gastroenteritis and colitis, unspecified: Secondary | ICD-10-CM

## 2015-08-06 DIAGNOSIS — A419 Sepsis, unspecified organism: Secondary | ICD-10-CM

## 2015-08-06 DIAGNOSIS — I609 Nontraumatic subarachnoid hemorrhage, unspecified: Secondary | ICD-10-CM

## 2015-08-06 DIAGNOSIS — J9601 Acute respiratory failure with hypoxia: Secondary | ICD-10-CM

## 2015-08-06 DIAGNOSIS — N179 Acute kidney failure, unspecified: Secondary | ICD-10-CM

## 2015-08-06 DIAGNOSIS — R569 Unspecified convulsions: Secondary | ICD-10-CM

## 2015-08-06 DIAGNOSIS — R7989 Other specified abnormal findings of blood chemistry: Secondary | ICD-10-CM

## 2015-08-06 DIAGNOSIS — K219 Gastro-esophageal reflux disease without esophagitis: Secondary | ICD-10-CM | POA: Insufficient documentation

## 2015-08-06 DIAGNOSIS — N1330 Unspecified hydronephrosis: Secondary | ICD-10-CM

## 2015-08-06 DIAGNOSIS — A4181 Sepsis due to Enterococcus: Secondary | ICD-10-CM

## 2015-08-06 DIAGNOSIS — R778 Other specified abnormalities of plasma proteins: Secondary | ICD-10-CM

## 2015-08-06 DIAGNOSIS — Q6211 Congenital occlusion of ureteropelvic junction: Secondary | ICD-10-CM | POA: Diagnosis not present

## 2015-08-06 DIAGNOSIS — G709 Myoneural disorder, unspecified: Secondary | ICD-10-CM | POA: Insufficient documentation

## 2015-08-06 DIAGNOSIS — L03115 Cellulitis of right lower limb: Secondary | ICD-10-CM

## 2015-08-06 DIAGNOSIS — B370 Candidal stomatitis: Secondary | ICD-10-CM

## 2015-08-06 DIAGNOSIS — L03116 Cellulitis of left lower limb: Secondary | ICD-10-CM

## 2015-08-06 HISTORY — PX: CYSTOSCOPY W/ URETERAL STENT PLACEMENT: SHX1429

## 2015-08-06 SURGERY — CYSTOSCOPY, FLEXIBLE, WITH STENT REPLACEMENT
Anesthesia: Monitor Anesthesia Care | Laterality: Left

## 2015-08-06 MED ORDER — MIDAZOLAM HCL 2 MG/2ML IJ SOLN
INTRAMUSCULAR | Status: AC
  Filled 2015-08-06: qty 2

## 2015-08-06 MED ORDER — DIATRIZOATE MEGLUMINE 30 % UR SOLN
URETHRAL | Status: AC
  Filled 2015-08-06: qty 300

## 2015-08-06 MED ORDER — LACTATED RINGERS IV SOLN
INTRAVENOUS | Status: DC
Start: 2015-08-06 — End: 2015-08-06

## 2015-08-06 MED ORDER — LACTATED RINGERS IV SOLN
INTRAVENOUS | Status: DC | PRN
  Administered 2015-08-06: 11:00:00 via INTRAVENOUS

## 2015-08-06 MED ORDER — FENTANYL CITRATE (PF) 100 MCG/2ML IJ SOLN
INTRAMUSCULAR | Status: DC | PRN
  Administered 2015-08-06 (×2): 25 ug via INTRAVENOUS

## 2015-08-06 MED ORDER — FENTANYL CITRATE (PF) 100 MCG/2ML IJ SOLN: 25.0000 ug | INTRAMUSCULAR | Status: DC | PRN

## 2015-08-06 MED ORDER — CHLORHEXIDINE GLUCONATE CLOTH 2 % EX PADS: 6.0000 | MEDICATED_PAD | Freq: Once | CUTANEOUS | Status: DC

## 2015-08-06 MED ORDER — FENTANYL CITRATE (PF) 250 MCG/5ML IJ SOLN
INTRAMUSCULAR | Status: AC
  Filled 2015-08-06: qty 5

## 2015-08-06 MED ORDER — OXYCODONE HCL 5 MG PO TABS: 5.0000 mg | ORAL_TABLET | ORAL | Status: DC | PRN

## 2015-08-06 MED ORDER — LIDOCAINE VISCOUS 2 % MT SOLN
OROMUCOSAL | Status: DC | PRN
  Administered 2015-08-06: 20 mL via OROMUCOSAL

## 2015-08-06 MED ORDER — PROPOFOL 10 MG/ML IV BOLUS
INTRAVENOUS | Status: AC
  Filled 2015-08-06: qty 20

## 2015-08-06 MED ORDER — ACETAMINOPHEN 650 MG RE SUPP: 650.0000 mg | RECTAL | Status: DC | PRN

## 2015-08-06 MED ORDER — LEVOFLOXACIN 500 MG PO TABS: 500.0000 mg | ORAL_TABLET | Freq: Every day | ORAL | Status: DC

## 2015-08-06 MED ORDER — FENTANYL CITRATE (PF) 100 MCG/2ML IJ SOLN
INTRAMUSCULAR | Status: AC
  Filled 2015-08-06: qty 2

## 2015-08-06 MED ORDER — LIDOCAINE VISCOUS 2 % MT SOLN
OROMUCOSAL | Status: AC
  Filled 2015-08-06: qty 15

## 2015-08-06 MED ORDER — PROPOFOL 500 MG/50ML IV EMUL
INTRAVENOUS | Status: DC | PRN
  Administered 2015-08-06: 50 ug/kg/min via INTRAVENOUS

## 2015-08-06 MED ORDER — ACETAMINOPHEN 325 MG PO TABS
650.0000 mg | ORAL_TABLET | ORAL | Status: DC | PRN
Start: 2015-08-06 — End: 2015-08-06

## 2015-08-06 MED ORDER — IOPAMIDOL (ISOVUE-300) INJECTION 61%
INTRAVENOUS | Status: AC
  Filled 2015-08-06: qty 50

## 2015-08-06 MED ORDER — STERILE WATER FOR IRRIGATION IR SOLN
Status: DC | PRN
  Administered 2015-08-06: 3000 mL

## 2015-08-06 MED ORDER — MIDAZOLAM HCL 5 MG/5ML IJ SOLN
INTRAMUSCULAR | Status: DC | PRN
  Administered 2015-08-06 (×2): 1 mg via INTRAVENOUS

## 2015-08-06 MED ORDER — SODIUM CHLORIDE 0.9% FLUSH: 3.0000 mL | INTRAVENOUS | Status: DC | PRN

## 2015-08-06 MED ORDER — DIATRIZOATE MEGLUMINE 30 % UR SOLN: URETHRAL | Status: DC | PRN

## 2015-08-06 MED ORDER — SODIUM CHLORIDE 0.9 % IV SOLN: 250.0000 mL | INTRAVENOUS | Status: DC | PRN

## 2015-08-06 MED ORDER — STERILE WATER FOR IRRIGATION IR SOLN
Status: DC | PRN
  Administered 2015-08-06: 1000 mL

## 2015-08-06 MED ORDER — SODIUM CHLORIDE 0.9% FLUSH: 3.0000 mL | Freq: Two times a day (BID) | INTRAVENOUS | Status: DC

## 2015-08-06 SURGICAL SUPPLY — 19 items
BAG DRAIN URO TABLE W/ADPT NS (DRAPE) ×3 IMPLANT
BAG DRN 8 ADPR NS SKTRN CSTL (DRAPE) ×1
BAG HAMPER (MISCELLANEOUS) ×3 IMPLANT
CATH INTERMIT  6FR 70CM (CATHETERS) ×1 IMPLANT
CLOTH BEACON ORANGE TIMEOUT ST (SAFETY) ×3 IMPLANT
GLOVE BIOGEL M 8.0 STRL (GLOVE) ×3 IMPLANT
GLOVE ECLIPSE 6.5 STRL STRAW (GLOVE) ×2 IMPLANT
GLOVE EXAM NITRILE PF MED BLUE (GLOVE) ×2 IMPLANT
GOWN STRL REUS W/ TWL LRG LVL3 (GOWN DISPOSABLE) ×1 IMPLANT
GOWN STRL REUS W/TWL LRG LVL3 (GOWN DISPOSABLE) ×3
GOWN STRL REUS W/TWL XL LVL3 (GOWN DISPOSABLE) ×3 IMPLANT
GUIDEWIRE STR DUAL SENSOR (WIRE) IMPLANT
KIT ROOM TURNOVER AP CYSTO (KITS) ×3 IMPLANT
MANIFOLD NEPTUNE II (INSTRUMENTS) ×3 IMPLANT
NS IRRIG 500ML POUR BTL (IV SOLUTION) IMPLANT
PACK CYSTO (CUSTOM PROCEDURE TRAY) ×3 IMPLANT
PAD ARMBOARD 7.5X6 YLW CONV (MISCELLANEOUS) ×3 IMPLANT
STENT CONTOUR 7FRX24 (STENTS) ×2 IMPLANT
TOWEL OR 17X26 4PK STRL BLUE (TOWEL DISPOSABLE) ×3 IMPLANT

## 2015-08-06 NOTE — Anesthesia Preprocedure Evaluation (Signed)
Anesthesia Evaluation  Patient identified by MRN, date of birth, ID band Patient awake    Reviewed: Allergy & Precautions, H&P , NPO status , Patient's Chart, lab work & pertinent test results  History of Anesthesia Complications Negative for: history of anesthetic complications  Airway Mallampati: II  TM Distance: >3 FB Neck ROM: Full    Dental  (+) Upper Dentures, Lower Dentures   Pulmonary shortness of breath, COPD,  COPD inhaler, former smoker,    Pulmonary exam normal breath sounds clear to auscultation       Cardiovascular hypertension, + Past MI  negative cardio ROS   Rhythm:Regular Rate:Normal     Neuro/Psych Seizures -, Well Controlled,  PSYCHIATRIC DISORDERS Anxiety Depression  Neuromuscular disease CVA    GI/Hepatic Neg liver ROS, hiatal hernia, GERD  Medicated and Controlled,  Endo/Other  negative endocrine ROS  Renal/GU Renal disease  negative genitourinary   Musculoskeletal  (+) Arthritis ,   Abdominal   Peds negative pediatric ROS (+)  Hematology negative hematology ROS (+)   Anesthesia Other Findings   Reproductive/Obstetrics negative OB ROS                             Anesthesia Physical Anesthesia Plan  ASA: III  Anesthesia Plan: MAC   Post-op Pain Management:    Induction: Intravenous  Airway Management Planned: Natural Airway, Nasal Cannula and Simple Face Mask  Additional Equipment: None  Intra-op Plan:   Post-operative Plan:   Informed Consent: I have reviewed the patients History and Physical, chart, labs and discussed the procedure including the risks, benefits and alternatives for the proposed anesthesia with the patient or authorized representative who has indicated his/her understanding and acceptance.   Dental advisory given  Plan Discussed with: CRNA and Surgeon  Anesthesia Plan Comments:         Anesthesia Quick Evaluation

## 2015-08-06 NOTE — Transfer of Care (Signed)
Immediate Anesthesia Transfer of Care Note  Patient: Bridget Ball  Procedure(s) Performed: Procedure(s): CYSTOSCOPY WITH LEFT URETERAL STENT EXCHANGE - JJ STENT (Left)  Patient Location: PACU  Anesthesia Type:MAC  Level of Consciousness: awake and patient cooperative  Airway & Oxygen Therapy: Patient Spontanous Breathing and Patient connected to face mask oxygen  Post-op Assessment: Report given to RN, Post -op Vital signs reviewed and stable and Patient moving all extremities  Post vital signs: Reviewed and stable  Last Vitals:  Vitals:   08/06/15 1117  BP: 108/75  Pulse: 87  Resp: 11  Temp: 36.8 C    Last Pain:  Vitals:   08/06/15 1117  TempSrc: Oral      Patients Stated Pain Goal: 5 ([id number])  Complications: No apparent anesthesia complications

## 2015-08-06 NOTE — Progress Notes (Signed)
Patient's daughter, Webb Silversmith signed patient's name to discharge instructions.  Verbalized understanding; told to follow up with doctor as scheduled.

## 2015-08-06 NOTE — H&P (Signed)
H&P  Chief Complaint:blocked kidney  History of Present Illness: Bridget Ball is a 82 y.o. year old female with a history of congenital UPJ obstruction with hydronephrosis, originally presented with pyelonephrosis.  She is now managed with stent placement/and change.  She currently is presenting for exchange of her left double-J stent.  Past Medical History:  Diagnosis Date  . Anxiety   . Arthritis   . Attention to nephrostomy ([place])    PT HAS NEPHROSTOMY TUBE IN PLACE  . Bruises easily   . Cancer [facility])    Skin cancer  . Chronic diarrhea   . Colitis   . COPD (chronic obstructive pulmonary disease) ([place])   . Dementia   . Depression   . GERD (gastroesophageal reflux disease)   . Hiatal hernia   . Hx of bronchitis   . Hx of pulmonary edema JULY 2015  . Hx of pulmonary edema JULY 2015  . Hx of septic shock JULY 2015  . Hydronephrosis, left   . Hyperlipidemia   . Memory difficulties   . Microscopic colitis   . Myocardial infarction [facility])    unknown time   . Seizures ([place])    last seizure was 4-5 years ago with "brain bleed"  . Shortness of breath    with exertion  . Stroke [facility])    TIA four years ago / stroke NOV 2014    Past Surgical History:  Procedure Laterality Date  . APPENDECTOMY    . BREAST BIOPSY Right 02/02/2014   Procedure: RIGHT BREAST BIOPSY;  Surgeon: Jamesetta So, MD;  Location: [facility] ORS;  Service: General;  Laterality: Right;  . COLONOSCOPY N/A 05/24/2013   Procedure: COLONOSCOPY;  Surgeon: Rogene Houston, MD;  Location: [facility] ENDO SUITE;  Service: Endoscopy;  Laterality: N/A;  100  . CYSTOSCOPY WITH RETROGRADE PYELOGRAM, URETEROSCOPY AND STENT PLACEMENT Left 09/18/2013   Procedure: CYSTOSCOPY WITH RETROGRADE PYELOGRAM,  URETEROSCOPY AND  STENT PLACEMENT, removal of nephrostomy tube;  Surgeon: Jorja Loa, MD;  Location: [facility] ORS;  Service: Urology;  Laterality: Left;  . CYSTOSCOPY WITH STENT PLACEMENT Left 09/04/2014   Procedure: CYSTOSCOPY WITH LEFT J2  STENT EXCHANGE;  Surgeon: Franchot Gallo, MD;  Location: [facility] ORS;  Service: Urology;  Laterality: Left;  . ESOPHAGOGASTRODUODENOSCOPY N/A 05/24/2013   Procedure: ESOPHAGOGASTRODUODENOSCOPY (EGD);  Surgeon: Rogene Houston, MD;  Location: [facility] ENDO SUITE;  Service: Endoscopy;  Laterality: N/A;  . KNEE ARTHROSCOPY Right   . SKIN CANCER EXCISION    . TONSILLECTOMY    . TUBAL LIGATION      Home Medications:  Medications Prior to Admission  Medication Sig Dispense Refill  . albuterol (PROVENTIL HFA;VENTOLIN HFA) 108 (90 BASE) MCG/ACT inhaler Inhale 2 puffs into the lungs every 6 (six) hours as needed for wheezing or shortness of breath. 1 Inhaler 0  . atorvastatin (LIPITOR) 80 MG tablet Take 1 tablet (80 mg total) by mouth daily at 6 PM. 30 tablet 0  . budesonide (ENTOCORT EC) 3 MG 24 hr capsule Take 6 mg by mouth daily as needed (diarrhea due to colitis.).     Marland Kitchen budesonide-formoterol (SYMBICORT) 160-4.5 MCG/ACT inhaler Inhale 2 puffs into the lungs 2 (two) times daily. 1 Inhaler 0  . calcium carbonate (TUMS - DOSED IN MG ELEMENTAL CALCIUM) 500 MG chewable tablet Chew 1 tablet by mouth as needed for indigestion or heartburn.    . citalopram (CELEXA) 10 MG tablet TAKE 1 TABLET (10 MG TOTAL) BY MOUTH DAILY. 90 tablet 1  .  levETIRAcetam (KEPPRA) 250 MG tablet Take 1 tablet (250 mg total) by mouth 2 (two) times daily. 60 tablet 11  . loperamide (IMODIUM) 2 MG capsule Take 1 capsule (2 mg total) by mouth daily before breakfast. (Patient taking differently: Take 2 mg by mouth as needed for diarrhea or loose stools. ) 30 capsule 0  . LORazepam (ATIVAN) 1 MG tablet 1 mg at bedtime. Per daughter only as needed for sleep.    . Methylfol-Algae-B12-Acetylcyst (CEREFOLIN NAC) 6-90.314-2-600 MG TABS Take 1 tablet by mouth daily. 90 tablet 3  . pantoprazole (PROTONIX) 40 MG tablet TAKE 1 TABLET BY MOUTH EVERY MORNING WITH BREAKFAST 30 tablet 5  . solifenacin (VESICARE) 5 MG tablet Take 5 mg by mouth daily.    Marland Kitchen  SPIRIVA HANDIHALER 18 MCG inhalation capsule Place 1 capsule into inhaler and inhale daily.  1  . ciprofloxacin (CIPRO) 500 MG tablet Take 1 tablet (500 mg total) by mouth every 12 (twelve) hours. (Patient not taking: Reported on 08/01/2015) 10 tablet 0  . Inulin (FIBER CHOICE) 1.5 g CHEW Chew 1 tablet by mouth 2 (two) times daily. (Patient not taking: Reported on 08/01/2015)    . memantine (NAMENDA TITRATION PAK) tablet pack 5 mg/day for =1 week; 5 mg twice daily for =1 week; 15 mg/day given in 5 mg and 10 mg separated doses for =1 week; then 10 mg twice daily (Patient not taking: Reported on 08/01/2015) 49 tablet 0  . memantine (NAMENDA) 5 MG tablet SIG:  Take one tablet daily for one week  then: take one tablet in AM and one tablet in PM for one week.  then: take one tablet in AM and two tablets in PM for one week.  then: take two tablets in AM and two tablets in PM.  If tolerating, then call us to refill. (Patient not taking: Reported on 08/01/2015) 70 tablet 0    Allergies:    Family History  Problem Relation Age of Onset  . Diabetes Mother   . Stroke Mother     Social History:  reports that she quit smoking about 17 years ago. Her smoking use included Cigarettes. She has a 50.00 pack-year smoking history. She has never used smokeless tobacco. She reports that she drinks about 4.2 oz of alcohol per week . She reports that she does not use drugs.  ROS: A complete review of systems was performed.  All systems are negative except for pertinent findings as noted.  Physical Exam:  Vital signs in last 24 hours: Temp:  [98.3 F (36.8 C)] 98.3 F (36.8 C) (08/01 1117) Pulse Rate:  [87] 87 (08/01 1117) Resp:  [11] 11 (08/01 1117) BP: (108)/(75) 108/75 (08/01 1117) SpO2:  [97 %] 97 % (08/01 1117) General:  Alert and oriented, No acute distress HEENT: Normocephalic, atraumatic Neck: No JVD or lymphadenopathy Cardiovascular: Regular rate and rhythm Lungs: Clear bilaterally Abdomen: Soft,  nontender, nondistended, no abdominal masses Back: No CVA tenderness Extremities: No edema Neurologic: Grossly intact  Laboratory Data:  No results found for this or any previous visit (from the past 24 hour(s)). No results found for this or any previous visit (from the past 240 hour(s)). Creatinine:  Recent Labs  08/01/15 0948  CREATININE 0.85    Radiologic Imaging: No results found.  Impression/Assessment:  Left hydronephrosis secondary to UPJ obstruction, for stent change  Plan:  Cystoscopy, left double-J stent exchange  Cheyenna Pankowski M 08/06/2015, 12:03 PM  Lillette Boxer. Silas Sedam MD

## 2015-08-06 NOTE — Anesthesia Postprocedure Evaluation (Signed)
Anesthesia Post Note  Patient: Bridget Ball  Procedure(s) Performed: Procedure(s) (LRB): CYSTOSCOPY WITH LEFT URETERAL STENT EXCHANGE - JJ STENT (Left)  Patient location during evaluation: PACU Anesthesia Type: MAC Level of consciousness: awake and alert, oriented and patient cooperative Pain management: pain level controlled Vital Signs Assessment: post-procedure vital signs reviewed and stable Respiratory status: nonlabored ventilation, respiratory function stable and spontaneous breathing Cardiovascular status: blood pressure returned to baseline Postop Assessment: no signs of nausea or vomiting Anesthetic complications: no    Last Vitals:  Vitals:   08/06/15 1245 08/06/15 1300  BP: 103/67 102/67  Pulse: 79 82  Resp: 15 15  Temp: 36.6 C     Last Pain:  Vitals:   08/06/15 1117  TempSrc: Oral                 Ridgely Anastacio J

## 2015-08-06 NOTE — Discharge Instructions (Signed)

## 2015-08-06 NOTE — Anesthesia Postprocedure Evaluation (Signed)
Anesthesia Post Note  Patient: DEEDRA BELIZAIRE  Procedure(s) Performed: Procedure(s) (LRB): CYSTOSCOPY WITH LEFT URETERAL STENT EXCHANGE - JJ STENT (Left)  Patient location during evaluation: PACU Anesthesia Type: MAC Level of consciousness: awake and alert, oriented and patient cooperative Pain management: pain level controlled Vital Signs Assessment: post-procedure vital signs reviewed and stable Respiratory status: spontaneous breathing, nonlabored ventilation and respiratory function stable Cardiovascular status: blood pressure returned to baseline and stable Postop Assessment: no signs of nausea or vomiting Anesthetic complications: no    Last Vitals:  Vitals:   08/06/15 1242 08/06/15 1245  BP: 103/67 103/67  Pulse:  79  Resp:  15  Temp:      Last Pain:  Vitals:   08/06/15 1117  TempSrc: Oral                 Katheen Aslin J

## 2015-08-06 NOTE — Op Note (Signed)
Preoperative diagnosis: History of left UPJ obstruction with history of pyonephrosis, now drained with double-J stent  Postoperative diagnosis: Same  Principal procedure: Cystoscopy, left double-J stent removal, placement of 7 French by 24 cm contour double-J stent without tether  Surgeon:  Oelkers  Anesthesia: Monitored anesthesia care  Complications: None  Drains: 7 French by 24 cm contour stent without tether  Indications: 82 year old female presenting in July, 2015 with pyonephrosis and a probable left UPJ obstruction. She was treated with stenting. Because of her age and concurrent medical condition, it was not felt that pyeloplasty was indicated. She has had an indwelling stent, and at this point presents for stent change.  Description of procedure: The patient was palmarly marked and identified in the holding area. She was taken to the operating room where IV sedation was performed. The patient was placed in the dorsolithotomy position. Genitalia and perineum were prepped and draped. Proper timeout was performed.  A 23 French panendoscope was advanced into her bladder which was inspected. It was normal except for the presence of a double-J stent emanating from the left ureteral orifice. This was grasped and brought out through the urethral meatus. A 0.038 inch sensor-tip guidewire was advanced through the distal double-J stent, and using fluoroscopic guidance, passed into the upper pole calyx. The stent was then removed over top of the guidewire. The guidewire was then backloaded through the scope, and using fluoroscopic and cystoscopic guidance, a new 7 [place] by 24 cm contour double-J stent was passed. Once the guidewire was removed, the stent was adequately deployed in the ureter. Excellent proximal and distal curls were seen. The bladder was then drained. The procedure was then terminated.  The patient was then removed turned to the PACU in stable condition.

## 2015-08-09 ENCOUNTER — Encounter (HOSPITAL_COMMUNITY): Payer: Self-pay | Admitting: Urology

## 2015-08-09 DIAGNOSIS — E782 Mixed hyperlipidemia: Secondary | ICD-10-CM | POA: Diagnosis not present

## 2015-08-11 NOTE — Addendum Note (Signed)
Addendum  created 08/11/15 0919 by Oleta Mouse, MD   Anesthesia Staff edited

## 2015-08-19 DIAGNOSIS — N182 Chronic kidney disease, stage 2 (mild): Secondary | ICD-10-CM | POA: Diagnosis not present

## 2015-08-19 DIAGNOSIS — D508 Other iron deficiency anemias: Secondary | ICD-10-CM | POA: Diagnosis not present

## 2015-08-19 DIAGNOSIS — E782 Mixed hyperlipidemia: Secondary | ICD-10-CM | POA: Diagnosis not present

## 2015-08-19 DIAGNOSIS — J449 Chronic obstructive pulmonary disease, unspecified: Secondary | ICD-10-CM | POA: Diagnosis not present

## 2015-08-19 DIAGNOSIS — K5100 Ulcerative (chronic) pancolitis without complications: Secondary | ICD-10-CM | POA: Diagnosis not present

## 2015-09-06 DIAGNOSIS — M7551 Bursitis of right shoulder: Secondary | ICD-10-CM | POA: Diagnosis not present

## 2015-09-06 DIAGNOSIS — Z23 Encounter for immunization: Secondary | ICD-10-CM | POA: Diagnosis not present

## 2015-09-10 ENCOUNTER — Ambulatory Visit (INDEPENDENT_AMBULATORY_CARE_PROVIDER_SITE_OTHER): Payer: Medicare Other | Admitting: Urology

## 2015-09-10 DIAGNOSIS — N131 Hydronephrosis with ureteral stricture, not elsewhere classified: Secondary | ICD-10-CM | POA: Diagnosis not present

## 2015-09-10 DIAGNOSIS — N312 Flaccid neuropathic bladder, not elsewhere classified: Secondary | ICD-10-CM | POA: Diagnosis not present

## 2015-09-24 DIAGNOSIS — Z85828 Personal history of other malignant neoplasm of skin: Secondary | ICD-10-CM | POA: Diagnosis not present

## 2015-09-24 DIAGNOSIS — L814 Other melanin hyperpigmentation: Secondary | ICD-10-CM | POA: Diagnosis not present

## 2015-09-24 DIAGNOSIS — L570 Actinic keratosis: Secondary | ICD-10-CM | POA: Diagnosis not present

## 2015-09-24 DIAGNOSIS — D1801 Hemangioma of skin and subcutaneous tissue: Secondary | ICD-10-CM | POA: Diagnosis not present

## 2015-09-24 DIAGNOSIS — L821 Other seborrheic keratosis: Secondary | ICD-10-CM | POA: Diagnosis not present

## 2015-09-26 ENCOUNTER — Ambulatory Visit (INDEPENDENT_AMBULATORY_CARE_PROVIDER_SITE_OTHER): Payer: Medicare Other | Admitting: Adult Health

## 2015-09-26 ENCOUNTER — Encounter: Payer: Self-pay | Admitting: Adult Health

## 2015-09-26 VITALS — BP 88/54 | HR 88 | Ht 60.0 in | Wt 95.8 lb

## 2015-09-26 DIAGNOSIS — R413 Other amnesia: Secondary | ICD-10-CM | POA: Diagnosis not present

## 2015-09-26 DIAGNOSIS — R569 Unspecified convulsions: Secondary | ICD-10-CM | POA: Diagnosis not present

## 2015-09-26 MED ORDER — MEMANTINE HCL 5 MG PO TABS: ORAL_TABLET | ORAL | 5 refills | Status: DC

## 2015-09-26 NOTE — Progress Notes (Signed)
PATIENT: Bridget Ball DOB: 1928-05-20  REASON FOR VISIT: follow up- memory impairment, seizures HISTORY FROM: patient  HISTORY OF PRESENT ILLNESS: Today 09/26/2015: [name] is an 82 year old female with a history of memory impairment and seizures. She returns today for follow-up. She reports that she did start Namenda however she lost her medication midway through her titration. She did not try to restart this. She states that her memory has remained stable. She is able to complete all ADLs independently. She doesn't operate a motor vehicle but only on [place] not very busy. She continues on Keppra for seizures. She has not had any seizure events. She returns today for an evaluation.  Update: 06/24/2015: Bridget Ball is an 82 year old female with a history of subarachnoid hemorrhage in 2014, seizures and memory impairment. She returns today for an evaluation. Her family reports that her memory has recently started to decline. They've noticed that she is more repetitive. She is able to complete all ADLs independently. She typically lives at home alone however her daughter recently had neck surgery since she moved in with her temporarily. The patient also has a history of a kidney stent. She reports it is malfunctioning and will need to be replaced. She is also been having bladder retention which caused a urinary tract infection. She just finished with her antibiotic. She is currently taking Cerefolin. However her family is interested in trying a different medication since they have noticed a decline in her memory. She continues on Keppra for seizure prevention. She's not had any additional seizure events. She returns today for an evaluation.    HISTORY (Bridget Ball): Initial Consult 01/24/2013 ; 44 year Caucasian lady seen for followup following hospital admission on 11/19/12 for subarachnoid hemorrhage and seizures. She presented with recurrent transient episodes of left body paresthesias and  difficulty speaking. The episode began in the left arm and gradually over minutes progressed to involve the face and lasted several minutes only. At times she was unable to form words and speak but she could understand what other people were saying. She described this as a feeling of pins and needles but without any impairment of consciousness. She had similar episodes in 2010 when she had a similar localized right frontal convexity subarachnoid hemorrhage. She had had no head trauma or significant headaches or had bumped her head. And at times MRI scan had shown similar findings to the current one which showed localized blood in the right frontal convexity with some diffusion positive changes suggesting microinfarction adjacent. She previously has cerebral catheter angiogram performed which showed no evidence of underlying AVM, aneurysms or vascular abnormalities. She was started on Neurontin for seizures and did well for a while but then gradually decided to stop the medication she did follow up with me for 1 year but subsequently was lost to followup. She had EEG during the previous admission and 2010 which did not show definite seizure activity.She had a repeat EEG on 11/21/12 which was normal. CT angiogram of the brain on 11/16/14and showed no evidence of underlying aneurysm or vascular abnormality. Urine drug screen was negative. Patient was started on Keppra initially 500 twice a day subsequent when episodes persisted was increased to thousand twice a day and Depakote ER was added. Patient states she's did well since discharge and has not had recurrent episodes of numbness however she has had confusion and agitation issues and hence has stopped the Depakote and in fact has even reduce the Keppra to 500 twice daily. Patient complains  of short-term problems as well as intermittent confusion, balance issues. She forgets how to use a credit card and daughter has to help her in on several occasions. She previously  had home so [place] which appear to be healing now and she does see a home care nurse twice a week. She hasn't never been diagnosed in the past with depression or been on any medication for that. She denies headache, focal extremity weakness, numbness or vision problems.   REVIEW OF SYSTEMS: Out of a complete 14 system review of symptoms, the patient complains only of the following symptoms, and all other reviewed systems are negative.  ALLERGIES: Allergies  Allergen Reactions  . Penicillins Anaphylaxis    Throat swelling as a child. Tolerated ceftriaxone 07/29/13.     HOME MEDICATIONS: Outpatient Medications Prior to Visit  Medication Sig Dispense Refill  . albuterol (PROVENTIL HFA;VENTOLIN HFA) 108 (90 BASE) MCG/ACT inhaler Inhale 2 puffs into the lungs every 6 (six) hours as needed for wheezing or shortness of breath. 1 Inhaler 0  . atorvastatin (LIPITOR) 80 MG tablet Take 1 tablet (80 mg total) by mouth daily at 6 PM. (Patient taking differently: Take 40 mg by mouth daily at 6 PM. ) 30 tablet 0  . budesonide (ENTOCORT EC) 3 MG 24 hr capsule Take 6 mg by mouth daily as needed (diarrhea due to colitis.).     Marland Kitchen budesonide-formoterol (SYMBICORT) 160-4.5 MCG/ACT inhaler Inhale 2 puffs into the lungs 2 (two) times daily. 1 Inhaler 0  . calcium carbonate (TUMS - DOSED IN MG ELEMENTAL CALCIUM) 500 MG chewable tablet Chew 1 tablet by mouth as needed for indigestion or heartburn.    . citalopram (CELEXA) 10 MG tablet TAKE 1 TABLET (10 MG TOTAL) BY MOUTH DAILY. 90 tablet 1  . levETIRAcetam (KEPPRA) 250 MG tablet Take 1 tablet (250 mg total) by mouth 2 (two) times daily. 60 tablet 11  . loperamide (IMODIUM) 2 MG capsule Take 1 capsule (2 mg total) by mouth daily before breakfast. (Patient taking differently: Take 2 mg by mouth as needed for diarrhea or loose stools. ) 30 capsule 0  . LORazepam (ATIVAN) 1 MG tablet 1 mg at bedtime. Per daughter only as needed for sleep.    . memantine (NAMENDA  TITRATION PAK) tablet pack 5 mg/day for =1 week; 5 mg twice daily for =1 week; 15 mg/day given in 5 mg and 10 mg separated doses for =1 week; then 10 mg twice daily 49 tablet 0  . memantine (NAMENDA) 5 MG tablet SIG:  Take one tablet daily for one week  then: take one tablet in AM and one tablet in PM for one week.  then: take one tablet in AM and two tablets in PM for one week.  then: take two tablets in AM and two tablets in PM.  If tolerating, then call us to refill. 70 tablet 0  . Methylfol-Algae-B12-Acetylcyst (CEREFOLIN NAC) 6-90.314-2-600 MG TABS Take 1 tablet by mouth daily. 90 tablet 3  . pantoprazole (PROTONIX) 40 MG tablet TAKE 1 TABLET BY MOUTH EVERY MORNING WITH BREAKFAST 30 tablet 5  . solifenacin (VESICARE) 5 MG tablet Take 5 mg by mouth daily.    Marland Kitchen SPIRIVA HANDIHALER 18 MCG inhalation capsule Place 1 capsule into inhaler and inhale daily.  1  . Inulin (FIBER CHOICE) 1.5 g CHEW Chew 1 tablet by mouth 2 (two) times daily. (Patient not taking: Reported on 08/01/2015)     No facility-administered medications prior to visit.  PAST MEDICAL HISTORY: Past Medical History:  Diagnosis Date  . Anxiety   . Arthritis   . Attention to nephrostomy ([place])    PT HAS NEPHROSTOMY TUBE IN PLACE  . Bruises easily   . Cancer [facility])    Skin cancer  . Chronic diarrhea   . Colitis   . COPD (chronic obstructive pulmonary disease) ([place])   . Dementia   . Depression   . GERD (gastroesophageal reflux disease)   . Hiatal hernia   . Hx of bronchitis   . Hx of pulmonary edema JULY 2015  . Hx of pulmonary edema JULY 2015  . Hx of septic shock JULY 2015  . Hydronephrosis, left   . Hyperlipidemia   . Memory difficulties   . Microscopic colitis   . Myocardial infarction [facility])    unknown time   . Seizures ([facility])    last seizure was 4-5 years ago with "brain bleed"  . Shortness of breath    with exertion  . Stroke [facility])    TIA four years ago / stroke NOV 2014    PAST SURGICAL HISTORY: Past  Surgical History:  Procedure Laterality Date  . APPENDECTOMY    . BREAST BIOPSY Right 02/02/2014   Procedure: RIGHT BREAST BIOPSY;  Surgeon: Jamesetta So, MD;  Location: [facility] ORS;  Service: General;  Laterality: Right;  . COLONOSCOPY N/A 05/24/2013   Procedure: COLONOSCOPY;  Surgeon: Rogene Houston, MD;  Location: [facility] ENDO SUITE;  Service: Endoscopy;  Laterality: N/A;  100  . CYSTOSCOPY W/ URETERAL STENT PLACEMENT Left 08/06/2015   Procedure: CYSTOSCOPY WITH LEFT URETERAL STENT EXCHANGE - Sammie Bench;  Surgeon: Franchot Gallo, MD;  Location: [facility] ORS;  Service: Urology;  Laterality: Left;  . CYSTOSCOPY WITH RETROGRADE PYELOGRAM, URETEROSCOPY AND STENT PLACEMENT Left 09/18/2013   Procedure: CYSTOSCOPY WITH RETROGRADE PYELOGRAM,  URETEROSCOPY AND  STENT PLACEMENT, removal of nephrostomy tube;  Surgeon: Jorja Loa, MD;  Location: [facility] ORS;  Service: Urology;  Laterality: Left;  . CYSTOSCOPY WITH STENT PLACEMENT Left 09/04/2014   Procedure: CYSTOSCOPY WITH LEFT J2 STENT EXCHANGE;  Surgeon: Franchot Gallo, MD;  Location: [facility];  Service: Urology;  Laterality: Left;  . ESOPHAGOGASTRODUODENOSCOPY N/A 05/24/2013   Procedure: ESOPHAGOGASTRODUODENOSCOPY (EGD);  Surgeon: Rogene Houston, MD;  Location: [facility];  Service: Endoscopy;  Laterality: N/A;  . KNEE ARTHROSCOPY Right   . SKIN CANCER EXCISION    . TONSILLECTOMY    . TUBAL LIGATION      FAMILY HISTORY: Family History  Problem Relation Age of Onset  . Diabetes Mother   . Stroke Mother     SOCIAL HISTORY: Social History   Social History  . Marital status: Widowed    Spouse name: N/A  . Number of children: 6  . Years of education: college   Occupational History  . retired    Social History Main Topics  . Smoking status: Former Smoker    Packs/day: 1.00    Years: 50.00    Types: Cigarettes    Quit date: 11/13/1997  . Smokeless tobacco: Never Used  . Alcohol use 4.2 oz/week    7 Glasses of wine per week     Comment: 2  glasses a night wine (none in a year)  . Drug use: No  . Sexual activity: Not Currently   Other Topics Concern  . Not on file   Social History Narrative   Patient is widowed with 6 children.   Patient is right handed.  Patient has college education.   Patient drinks 4 cups daily.      PHYSICAL EXAM  Vitals:   09/26/15 1424  BP: (!) 88/54  Pulse: 88  Weight: 95 lb 12.8 oz (43.5 kg)  Height: 5' (1.524 m)   Body mass index is 18.71 kg/m. MMSE - Mini Mental State Exam 09/26/2015 06/24/2015 03/20/2015  Orientation to time 4 2 4   Orientation to Place 3 5 3   Registration 3 3 3   Attention/ Calculation 5 3 5   Recall 1 1 2   Language- name 2 objects 2 2 2   Language- repeat 1 1 1   Language- follow 3 step command 3 3 3   Language- read & follow direction 1 1 1   Write a sentence 1 1 1   Copy design 1 1 1   Total score 25 23 26     Generalized: Well developed, in no acute distress   Neurological examination  Mentation: Alert. Follows all commands speech and language fluent Cranial nerve II-XII: Pupils were equal round reactive to light. Extraocular movements were full, visual field were full on confrontational test. Facial sensation and strength were normal. Uvula tongue midline. Head turning and shoulder shrug  were normal and symmetric. Motor: The motor testing reveals 5 over 5 strength of all 4 extremities. Good symmetric motor tone is noted throughout.  Sensory: Sensory testing is intact to soft touch on all 4 extremities. No evidence of extinction is noted.  Coordination: Cerebellar testing reveals good finger-nose-finger and heel-to-shin bilaterally.  Gait and station: Gait is normal.  Reflexes: Deep tendon reflexes are symmetric and normal bilaterally.   DIAGNOSTIC DATA (LABS, IMAGING, TESTING) - I reviewed patient records, labs, notes, testing and imaging myself where available.  Lab Results  Component Value Date   WBC 5.9 08/01/2015   HGB 11.1 (L) 08/01/2015   HCT 34.1  (L) 08/01/2015   MCV 96.6 08/01/2015   PLT 203 08/01/2015      Component Value Date/Time   NA 139 08/01/2015 0948   NA 142 06/24/2015 1425   K 3.4 (L) 08/01/2015 0948   CL 109 08/01/2015 0948   CO2 27 08/01/2015 0948   GLUCOSE 89 08/01/2015 0948   BUN 11 08/01/2015 0948   BUN 16 06/24/2015 1425   CREATININE 0.85 08/01/2015 0948   CALCIUM 8.6 (L) 08/01/2015 0948   PROT 6.2 06/24/2015 1425   ALBUMIN 4.0 06/24/2015 1425   AST 18 06/24/2015 1425   ALT 18 06/24/2015 1425   ALKPHOS 72 06/24/2015 1425   BILITOT 0.3 06/24/2015 1425   GFRNONAA >60 08/01/2015 0948   GFRAA >60 08/01/2015 0948         ASSESSMENT AND PLAN 82 y.o. year old female  has a past medical history of Anxiety; Arthritis; Attention to nephrostomy [facility]); Bruises easily; Cancer ([place]); Chronic diarrhea; Colitis; COPD (chronic obstructive pulmonary disease) ([place]); Dementia; Depression; GERD (gastroesophageal reflux disease); Hiatal hernia; bronchitis; pulmonary edema (JULY 2015); pulmonary edema (JULY 2015); septic shock (JULY 2015); Hydronephrosis, left; Hyperlipidemia; Memory difficulties; Microscopic colitis; Myocardial infarction ([place]); Seizures ([place]); Shortness of breath; and Stroke ([place]). here with:  1. Seizures 2. Memory impairment  Overall the patient's memory score has remained stable. We will restart Namenda. Patient will continue on Keppra for seizure prevention. Patient advised that if her symptoms worsen or she develops any new symptoms she should let us know. Follow-up in 6 months or sooner if needed.     Ward Givens, MSN, NP-C 09/26/2015, 2:55 PM [facility] [address], [address]  [place], [place] [address] [phone]

## 2015-09-26 NOTE — Patient Instructions (Signed)
Try Namenda Memory score stable If your symptoms worsen or you develop new symptoms please let us know.

## 2015-09-26 NOTE — Progress Notes (Signed)
I agree with the assessment and plan as directed by NP .The patient is known to me .   Keyna Blizard, MD  

## 2015-10-01 ENCOUNTER — Encounter (INDEPENDENT_AMBULATORY_CARE_PROVIDER_SITE_OTHER): Payer: Self-pay | Admitting: Internal Medicine

## 2015-10-01 ENCOUNTER — Ambulatory Visit (INDEPENDENT_AMBULATORY_CARE_PROVIDER_SITE_OTHER): Payer: Medicare Other | Admitting: Internal Medicine

## 2015-10-01 VITALS — BP 80/58 | HR 64 | Temp 98.1°F | Resp 18 | Ht 61.0 in | Wt 93.7 lb

## 2015-10-01 DIAGNOSIS — R197 Diarrhea, unspecified: Secondary | ICD-10-CM | POA: Diagnosis not present

## 2015-10-01 DIAGNOSIS — R634 Abnormal weight loss: Secondary | ICD-10-CM

## 2015-10-01 DIAGNOSIS — K52832 Lymphocytic colitis: Secondary | ICD-10-CM | POA: Diagnosis not present

## 2015-10-01 NOTE — Progress Notes (Signed)
Presenting complaint;  Follow-up for diarrhea and weight loss.  Database and Subjective:  Patient is 82 year old Caucasian female was history of chronic diarrhea secondary to microscopic colitis and history of weight loss. She is here for scheduled visit accompanied by her daughter [name]. She states she is feeling better. She is having an average of 2 loose stools per day. She rarely has formed stool. She is using Imodium on as-needed basis. She also has not taken Entocort recently. She says she has very good appetite and her daughter concurs. However she has lost 4 pounds since her last visit. She denies nausea vomiting fever or chills. She also denies abdominal pain. She has exertional dyspnea on walking half a block.   Current Medications: Outpatient Encounter Prescriptions as of 10/01/2015  Medication Sig  . albuterol (PROVENTIL HFA;VENTOLIN HFA) 108 (90 BASE) MCG/ACT inhaler Inhale 2 puffs into the lungs every 6 (six) hours as needed for wheezing or shortness of breath.  Marland Kitchen atorvastatin (LIPITOR) 80 MG tablet Take 1 tablet (80 mg total) by mouth daily at 6 PM. (Patient taking differently: Take 40 mg by mouth daily at 6 PM. )  . budesonide (ENTOCORT EC) 3 MG 24 hr capsule Take 6 mg by mouth daily as needed (diarrhea due to colitis.).   Marland Kitchen budesonide-formoterol (SYMBICORT) 160-4.5 MCG/ACT inhaler Inhale 2 puffs into the lungs 2 (two) times daily.  . calcium carbonate (TUMS - DOSED IN MG ELEMENTAL CALCIUM) 500 MG chewable tablet Chew 1 tablet by mouth as needed for indigestion or heartburn.  . citalopram (CELEXA) 10 MG tablet TAKE 1 TABLET (10 MG TOTAL) BY MOUTH DAILY.  Marland Kitchen levETIRAcetam (KEPPRA) 250 MG tablet Take 1 tablet (250 mg total) by mouth 2 (two) times daily.  Marland Kitchen loperamide (IMODIUM) 2 MG capsule Take 1 capsule (2 mg total) by mouth daily before breakfast. (Patient taking differently: Take 2 mg by mouth as needed for diarrhea or loose stools. )  . Methylfol-Algae-B12-Acetylcyst (CEREFOLIN  NAC) 6-90.314-2-600 MG TABS Take 1 tablet by mouth daily.  . pantoprazole (PROTONIX) 40 MG tablet TAKE 1 TABLET BY MOUTH EVERY MORNING WITH BREAKFAST  . solifenacin (VESICARE) 5 MG tablet Take 5 mg by mouth daily.  Marland Kitchen SPIRIVA HANDIHALER 18 MCG inhalation capsule Place 1 capsule into inhaler and inhale daily.  . memantine (NAMENDA) 5 MG tablet SIG:  Take one tablet daily for one week  then: take one tablet in AM and one tablet in PM for one week.  then: take one tablet in AM and two tablets in PM for one week.  then: take two tablets in AM and two tablets in PM. (Patient not taking: Reported on 10/01/2015)  . [DISCONTINUED] LORazepam (ATIVAN) 1 MG tablet 1 mg at bedtime. Per daughter only as needed for sleep.   No facility-administered encounter medications on file as of 10/01/2015.      Objective: Blood pressure (!) 80/58, pulse 64, temperature 98.1 F (36.7 C), temperature source Oral, resp. rate 18, height 5\' 1"  (1.549 m), weight 93 lb 11.2 oz (42.5 kg). Well-developed thin Caucasian female who is in no acute distress. She is in jovial mood. Conjunctiva is pink. Sclera is nonicteric Oropharyngeal mucosa is normal. No neck masses or thyromegaly noted. Cardiac exam with regular rhythm normal S1 and S2. No murmur or gallop noted. Lungs are clear to auscultation. Abdomen is flat. Bowel sounds are hyperactive. On palpation abdomen is soft and nontender without organomegaly or masses. She is ticklish.  No LE edema or clubbing noted.  Labs/studies  Results: Blood work from 08/01/2015  WBC 5.9, H&H 11.1 34.1 and platelet count 203K.    Assessment:  #1. Weight loss. He is continuing to lose weight gradually. Weight loss does not appear to be due to diarrhea and/or diminished intake. TSH back in April 2015 was normal. I do not have recent blood work. Clinically she does not appear to be hyperthyroid. I wonder if 8 losses due to chronic lung disease. #2. Diarrhea secondary to microscopic  colitis. She is not having copious diarrhea like she was. She is on Symbicort for lung problems and I wonder if it is also helping with colonic disease.  #3. GERD. Heartburn is well controlled with PPI.   Plan:  Patient advised to take Imodium OTC 2 mg every morning and second dose on as-needed basis.. Patient can use Dulcolax suppository or Fleet enema for constipation but should refrain from using stimulant laxatives. Weight check in 6-8 weeks. Office visit in 6 months

## 2015-10-01 NOTE — Patient Instructions (Signed)
Take Imodium OTC 2 mg by mouth every morning and second dose on as-needed basis. If you develop constipation you can use Dulcolax suppository or Fleet enema. Weight check in 6-8 weeks

## 2015-10-15 DIAGNOSIS — R638 Other symptoms and signs concerning food and fluid intake: Secondary | ICD-10-CM | POA: Diagnosis not present

## 2015-10-15 DIAGNOSIS — J449 Chronic obstructive pulmonary disease, unspecified: Secondary | ICD-10-CM | POA: Diagnosis not present

## 2015-10-15 DIAGNOSIS — E46 Unspecified protein-calorie malnutrition: Secondary | ICD-10-CM | POA: Diagnosis not present

## 2015-10-15 DIAGNOSIS — M79643 Pain in unspecified hand: Secondary | ICD-10-CM | POA: Diagnosis not present

## 2015-10-23 ENCOUNTER — Other Ambulatory Visit (INDEPENDENT_AMBULATORY_CARE_PROVIDER_SITE_OTHER): Payer: Self-pay | Admitting: Internal Medicine

## 2015-10-29 ENCOUNTER — Other Ambulatory Visit: Payer: Self-pay | Admitting: Urology

## 2015-10-29 DIAGNOSIS — N1330 Unspecified hydronephrosis: Secondary | ICD-10-CM

## 2015-11-01 ENCOUNTER — Observation Stay (HOSPITAL_COMMUNITY)
Admission: EM | Admit: 2015-11-01 | Discharge: 2015-11-04 | Disposition: A | Discharge status: Home / Self Care | Payer: Medicare Other | Attending: Family Medicine | Admitting: Family Medicine

## 2015-11-01 ENCOUNTER — Encounter (HOSPITAL_COMMUNITY): Payer: Self-pay | Admitting: Emergency Medicine

## 2015-11-01 ENCOUNTER — Emergency Department (HOSPITAL_COMMUNITY): Payer: Medicare Other

## 2015-11-01 DIAGNOSIS — L03115 Cellulitis of right lower limb: Principal | ICD-10-CM | POA: Insufficient documentation

## 2015-11-01 DIAGNOSIS — Z79899 Other long term (current) drug therapy: Secondary | ICD-10-CM | POA: Insufficient documentation

## 2015-11-01 DIAGNOSIS — D649 Anemia, unspecified: Secondary | ICD-10-CM

## 2015-11-01 DIAGNOSIS — S81809A Unspecified open wound, unspecified lower leg, initial encounter: Secondary | ICD-10-CM | POA: Diagnosis present

## 2015-11-01 DIAGNOSIS — E876 Hypokalemia: Secondary | ICD-10-CM | POA: Diagnosis not present

## 2015-11-01 DIAGNOSIS — M79604 Pain in right leg: Secondary | ICD-10-CM | POA: Diagnosis present

## 2015-11-01 DIAGNOSIS — K52839 Microscopic colitis, unspecified: Secondary | ICD-10-CM

## 2015-11-01 DIAGNOSIS — Z23 Encounter for immunization: Secondary | ICD-10-CM | POA: Insufficient documentation

## 2015-11-01 DIAGNOSIS — Z87891 Personal history of nicotine dependence: Secondary | ICD-10-CM | POA: Insufficient documentation

## 2015-11-01 DIAGNOSIS — R609 Edema, unspecified: Secondary | ICD-10-CM

## 2015-11-01 DIAGNOSIS — E871 Hypo-osmolality and hyponatremia: Secondary | ICD-10-CM | POA: Insufficient documentation

## 2015-11-01 DIAGNOSIS — Z85828 Personal history of other malignant neoplasm of skin: Secondary | ICD-10-CM | POA: Diagnosis not present

## 2015-11-01 DIAGNOSIS — L0390 Cellulitis, unspecified: Secondary | ICD-10-CM

## 2015-11-01 DIAGNOSIS — J449 Chronic obstructive pulmonary disease, unspecified: Secondary | ICD-10-CM | POA: Insufficient documentation

## 2015-11-01 DIAGNOSIS — S81801A Unspecified open wound, right lower leg, initial encounter: Secondary | ICD-10-CM | POA: Diagnosis not present

## 2015-11-01 LAB — CBC WITH DIFFERENTIAL/PLATELET
BASOS ABS: 0 10*3/uL (ref 0.0–0.1)
BASOS PCT: 0 %
EOS PCT: 0 %
Eosinophils Absolute: 0.1 10*3/uL (ref 0.0–0.7)
HCT: 35.3 % — ABNORMAL LOW (ref 36.0–46.0)
Hemoglobin: 11.8 g/dL — ABNORMAL LOW (ref 12.0–15.0)
LYMPHS PCT: 7 %
Lymphs Abs: 0.9 10*3/uL (ref 0.7–4.0)
MCH: 32.3 pg (ref 26.0–34.0)
MCHC: 33.4 g/dL (ref 30.0–36.0)
MCV: 96.7 fL (ref 78.0–100.0)
MONO ABS: 1.1 10*3/uL — AB (ref 0.1–1.0)
Monocytes Relative: 8 %
NEUTROS ABS: 11.2 10*3/uL — AB (ref 1.7–7.7)
Neutrophils Relative %: 85 %
PLATELETS: 215 10*3/uL (ref 150–400)
RBC: 3.65 MIL/uL — AB (ref 3.87–5.11)
RDW: 15.5 % (ref 11.5–15.5)
WBC: 13.4 10*3/uL — AB (ref 4.0–10.5)

## 2015-11-01 LAB — BASIC METABOLIC PANEL
ANION GAP: 7 (ref 5–15)
BUN: 17 mg/dL (ref 6–20)
CALCIUM: 9 mg/dL (ref 8.9–10.3)
CO2: 23 mmol/L (ref 22–32)
Chloride: 104 mmol/L (ref 101–111)
Creatinine, Ser: 1.1 mg/dL — ABNORMAL HIGH (ref 0.44–1.00)
GFR, EST AFRICAN AMERICAN: 52 mL/min — AB (ref >60)
GFR, EST NON AFRICAN AMERICAN: 44 mL/min — AB (ref >60)
Glucose, Bld: 114 mg/dL — ABNORMAL HIGH (ref 65–99)
POTASSIUM: 3 mmol/L — AB (ref 3.5–5.1)
Sodium: 134 mmol/L — ABNORMAL LOW (ref 135–145)

## 2015-11-01 LAB — URINALYSIS, ROUTINE W REFLEX MICROSCOPIC
Bilirubin Urine: NEGATIVE
Glucose, UA: NEGATIVE mg/dL
KETONES UR: NEGATIVE mg/dL
NITRITE: POSITIVE — AB
PROTEIN: NEGATIVE mg/dL
Specific Gravity, Urine: <1.005 — ABNORMAL LOW (ref 1.005–1.030)
pH: 6 (ref 5.0–8.0)

## 2015-11-01 LAB — URINE MICROSCOPIC-ADD ON

## 2015-11-01 MED ORDER — ACETAMINOPHEN 650 MG RE SUPP: 650.0000 mg | Freq: Four times a day (QID) | RECTAL | Status: DC | PRN

## 2015-11-01 MED ORDER — ACETAMINOPHEN 325 MG PO TABS
650.0000 mg | ORAL_TABLET | Freq: Four times a day (QID) | ORAL | Status: DC | PRN
Start: 2015-11-01 — End: 2015-11-04
  Administered 2015-11-01 – 2015-11-02 (×2): 650 mg via ORAL
  Filled 2015-11-01 (×2): qty 2

## 2015-11-01 MED ORDER — BUDESONIDE 3 MG PO CPEP
6.0000 mg | ORAL_CAPSULE | Freq: Every morning | ORAL | Status: DC
  Administered 2015-11-02 – 2015-11-04 (×3): 6 mg via ORAL
  Filled 2015-11-01 (×5): qty 2

## 2015-11-01 MED ORDER — DARIFENACIN HYDROBROMIDE ER 7.5 MG PO TB24
7.5000 mg | ORAL_TABLET | Freq: Every day | ORAL | Status: DC
  Administered 2015-11-01 – 2015-11-04 (×4): 7.5 mg via ORAL
  Filled 2015-11-01 (×4): qty 1

## 2015-11-01 MED ORDER — TIOTROPIUM BROMIDE MONOHYDRATE 18 MCG IN CAPS
1.0000 | ORAL_CAPSULE | Freq: Every day | RESPIRATORY_TRACT | Status: DC
  Administered 2015-11-02 – 2015-11-04 (×3): 18 ug via RESPIRATORY_TRACT
  Filled 2015-11-01: qty 5

## 2015-11-01 MED ORDER — ENOXAPARIN SODIUM 30 MG/0.3ML ~~LOC~~ SOLN
30.0000 mg | SUBCUTANEOUS | Status: DC
Start: 2015-11-01 — End: 2015-11-02
  Administered 2015-11-01: 30 mg via SUBCUTANEOUS
  Filled 2015-11-01: qty 0.3

## 2015-11-01 MED ORDER — POTASSIUM CHLORIDE 20 MEQ PO PACK
40.0000 meq | PACK | Freq: Once | ORAL | Status: AC
  Administered 2015-11-01: 40 meq via ORAL
  Filled 2015-11-01: qty 2

## 2015-11-01 MED ORDER — INFLUENZA VAC SPLIT QUAD 0.5 ML IM SUSY
0.5000 mL | PREFILLED_SYRINGE | INTRAMUSCULAR | Status: AC
  Administered 2015-11-02: 0.5 mL via INTRAMUSCULAR
  Filled 2015-11-01: qty 0.5

## 2015-11-01 MED ORDER — ALBUTEROL SULFATE (2.5 MG/3ML) 0.083% IN NEBU: 3.0000 mL | INHALATION_SOLUTION | Freq: Four times a day (QID) | RESPIRATORY_TRACT | Status: DC | PRN

## 2015-11-01 MED ORDER — ATORVASTATIN CALCIUM 40 MG PO TABS
40.0000 mg | ORAL_TABLET | Freq: Every day | ORAL | Status: DC
  Administered 2015-11-01 – 2015-11-03 (×3): 40 mg via ORAL
  Filled 2015-11-01 (×3): qty 1

## 2015-11-01 MED ORDER — ACETAMINOPHEN 325 MG PO TABS
650.0000 mg | ORAL_TABLET | Freq: Once | ORAL | Status: AC
  Administered 2015-11-01: 650 mg via ORAL
  Filled 2015-11-01: qty 2

## 2015-11-01 MED ORDER — VANCOMYCIN HCL IN DEXTROSE 1-5 GM/200ML-% IV SOLN
1000.0000 mg | Freq: Once | INTRAVENOUS | Status: AC
  Administered 2015-11-01: 1000 mg via INTRAVENOUS
  Filled 2015-11-01: qty 200

## 2015-11-01 MED ORDER — POTASSIUM CHLORIDE IN NACL 40-0.9 MEQ/L-% IV SOLN
INTRAVENOUS | Status: DC
  Administered 2015-11-01: 50 mL/h via INTRAVENOUS

## 2015-11-01 MED ORDER — LOPERAMIDE HCL 2 MG PO CAPS
2.0000 mg | ORAL_CAPSULE | ORAL | Status: DC | PRN
  Administered 2015-11-02 – 2015-11-03 (×2): 2 mg via ORAL
  Filled 2015-11-01 (×2): qty 1

## 2015-11-01 MED ORDER — MEGESTROL ACETATE 40 MG PO TABS
ORAL_TABLET | ORAL | Status: AC
  Filled 2015-11-01: qty 2

## 2015-11-01 MED ORDER — SODIUM CHLORIDE 0.9 % IV BOLUS (SEPSIS)
250.0000 mL | Freq: Once | INTRAVENOUS | Status: AC
  Administered 2015-11-01: 250 mL via INTRAVENOUS

## 2015-11-01 MED ORDER — PANTOPRAZOLE SODIUM 40 MG PO TBEC
40.0000 mg | DELAYED_RELEASE_TABLET | Freq: Every day | ORAL | Status: DC
  Administered 2015-11-01 – 2015-11-04 (×4): 40 mg via ORAL
  Filled 2015-11-01 (×4): qty 1

## 2015-11-01 MED ORDER — CEREFOLIN NAC 6-90.314-2-600 MG PO TABS
1.0000 | ORAL_TABLET | Freq: Every day | ORAL | Status: DC
  Filled 2015-11-01 (×3): qty 1

## 2015-11-01 MED ORDER — MOMETASONE FURO-FORMOTEROL FUM 200-5 MCG/ACT IN AERO
2.0000 | INHALATION_SPRAY | Freq: Two times a day (BID) | RESPIRATORY_TRACT | Status: DC
  Administered 2015-11-01 – 2015-11-04 (×6): 2 via RESPIRATORY_TRACT
  Filled 2015-11-01: qty 8.8

## 2015-11-01 MED ORDER — LEVETIRACETAM 250 MG PO TABS
250.0000 mg | ORAL_TABLET | Freq: Two times a day (BID) | ORAL | Status: DC
  Administered 2015-11-01 – 2015-11-04 (×6): 250 mg via ORAL
  Filled 2015-11-01 (×6): qty 1

## 2015-11-01 MED ORDER — CITALOPRAM HYDROBROMIDE 20 MG PO TABS
10.0000 mg | ORAL_TABLET | Freq: Every day | ORAL | Status: DC
  Administered 2015-11-01 – 2015-11-04 (×4): 10 mg via ORAL
  Filled 2015-11-01 (×4): qty 1

## 2015-11-01 MED ORDER — MEGESTROL ACETATE 40 MG PO TABS
80.0000 mg | ORAL_TABLET | Freq: Every day | ORAL | Status: DC
  Administered 2015-11-01 – 2015-11-04 (×4): 80 mg via ORAL
  Filled 2015-11-01 (×6): qty 2

## 2015-11-01 MED ORDER — LEVOFLOXACIN IN D5W 500 MG/100ML IV SOLN
500.0000 mg | Freq: Once | INTRAVENOUS | Status: AC
  Administered 2015-11-01: 500 mg via INTRAVENOUS
  Filled 2015-11-01: qty 100

## 2015-11-01 NOTE — ED Triage Notes (Addendum)
Pt c/o rle pain, swelling and discoloration. Pt states "she thinks she hit it on something". Pt is poor historian. Daughter with pt.

## 2015-11-01 NOTE — Progress Notes (Signed)
ANTIBIOTIC CONSULT NOTE-Preliminary  Pharmacy Consult for Vancomycin and Levaquin Indication: cellulitis and UTI  Allergies: PCN noted Patient Measurements:    Vital Signs: Temp: 98 F (36.7 C) (10/27 1703) Temp Source: Oral (10/27 1703) BP: 107/67 (10/27 2055) Pulse Rate: 107 (10/27 2055)  Labs:  Recent Labs  11/01/15 1811  WBC 13.4*  HGB 11.8*  PLT 215  CREATININE 1.10*   CrCl cannot be calculated (Unknown ideal weight.).  No results for input(s): VANCOTROUGH, VANCOPEAK, VANCORANDOM, GENTTROUGH, GENTPEAK, GENTRANDOM, TOBRATROUGH, TOBRAPEAK, TOBRARND, AMIKACINPEAK, AMIKACINTROU, AMIKACIN in the last 72 hours.   Microbiology: No results found for this or any previous visit (from the past 720 hour(s)).  Medical History: Past Medical History:  Diagnosis Date  . Anxiety   . Arthritis   . Attention to nephrostomy ([place])    PT HAS NEPHROSTOMY TUBE IN PLACE  . Bruises easily   . Cancer [facility])    Skin cancer  . Chronic diarrhea   . Colitis   . COPD (chronic obstructive pulmonary disease) ([place])   . Dementia   . Depression   . GERD (gastroesophageal reflux disease)   . Hiatal hernia   . Hx of bronchitis   . Hx of pulmonary edema JULY 2015  . Hx of pulmonary edema JULY 2015  . Hx of septic shock JULY 2015  . Hydronephrosis, left   . Hyperlipidemia   . Memory difficulties   . Microscopic colitis   . Myocardial infarction    unknown time   . Seizures ([place])    last seizure was 4-5 years ago with "brain bleed"  . Shortness of breath    with exertion  . Stroke [facility])    TIA four years ago / stroke NOV 2014   Medications:  Prescriptions Prior to Admission  Medication Sig Dispense Refill Last Dose  . albuterol (PROVENTIL HFA;VENTOLIN HFA) 108 (90 BASE) MCG/ACT inhaler Inhale 2 puffs into the lungs every 6 (six) hours as needed for wheezing or shortness of breath. 1 Inhaler 0 10/31/2015 at Unknown time  . atorvastatin (LIPITOR) 80 MG tablet Take 1 tablet (80 mg  total) by mouth daily at 6 PM. (Patient taking differently: Take 40 mg by mouth daily at 6 PM. ) 30 tablet 0 10/31/2015 at Unknown time  . budesonide (ENTOCORT EC) 3 MG 24 hr capsule Take 6 mg by mouth every morning.    10/31/2015 at Unknown time  . budesonide-formoterol (SYMBICORT) 160-4.5 MCG/ACT inhaler Inhale 2 puffs into the lungs 2 (two) times daily. 1 Inhaler 0 11/01/2015 at Unknown time  . citalopram (CELEXA) 10 MG tablet TAKE 1 TABLET (10 MG TOTAL) BY MOUTH DAILY. 90 tablet 1 10/31/2015 at Unknown time  . levETIRAcetam (KEPPRA) 250 MG tablet Take 1 tablet (250 mg total) by mouth 2 (two) times daily. 60 tablet 11 10/31/2015 at 2100  . loperamide (IMODIUM) 2 MG capsule Take 1 capsule (2 mg total) by mouth daily before breakfast. (Patient taking differently: Take 2 mg by mouth as needed for diarrhea or loose stools. ) 30 capsule 0 unknown  . megestrol (MEGACE) 40 MG tablet Take 80 mg by mouth daily.   10/31/2015 at Unknown time  . Methylfol-Algae-B12-Acetylcyst (CEREFOLIN NAC) 6-90.314-2-600 MG TABS Take 1 tablet by mouth daily. 90 tablet 3 10/31/2015 at Unknown time  . pantoprazole (PROTONIX) 40 MG tablet TAKE ONE TABLET ONCE DAILY WITH BREAKFAST 30 tablet 5 10/31/2015 at Unknown time  . solifenacin (VESICARE) 5 MG tablet Take 5 mg by mouth daily.   10/31/2015  at Unknown time  . SPIRIVA HANDIHALER 18 MCG inhalation capsule Place 1 capsule into inhaler and inhale daily.  1 10/31/2015 at Unknown time  . memantine (NAMENDA) 5 MG tablet SIG:  Take one tablet daily for one week  then: take one tablet in AM and one tablet in PM for one week.  then: take one tablet in AM and two tablets in PM for one week.  then: take two tablets in AM and two tablets in PM. (Patient not taking: Reported on 10/01/2015) 120 tablet 5 Not Taking   Assessment: 82yo female with h/o COPD, dementia and sz d/o.  Reportedly with slight fever.  Asked to initiate Vancomycin and Levaquin for cellulitis and UTI.  Goal of  Therapy:  Eradicate infection.  Plan:  Preliminary review of pertinent patient information completed.  Protocol will be initiated with a one-time dose(s) of Vancomycin 1000mg  (given) and Levaquin 500mg  (ordered).  Forestine Na clinical pharmacist will complete review during morning rounds to assess patient and finalize treatment regimen.  Hart Robinsons A, RPH 11/01/2015,9:30 PM

## 2015-11-01 NOTE — H&P (Signed)
TRH H&P   Patient Demographics:    Bridget Ball, is a 82 y.o. female  MRN: [id number]   DOB - 1928/08/20  Admit Date - 11/01/2015  Outpatient Primary MD for the patient is [name], MD  Referring MD/NP/PA: Dr. Oleta Mouse  Outpatient Specialists:     Patient coming from: home  Chief Complaint  Patient presents with  . Leg Pain      HPI:    Bridget Ball  is a 82 y.o. female, w Copd not on home o2, Dementia, SAH, seizure disorder apparently presents with c/o right distal lower ext redness just noticed today by her daughter.  Slight subjective fever.  Slight pain in the right distal lower ext, pt was noted to have mild hypokalemia as well as hyponatremia.  pt will be admitted for evaluation of cellulitis.    Review of systems:    In addition to the HPI above,  No Fever-chills, No Headache, No changes with Vision or hearing, No problems swallowing food or Liquids, No Chest pain, Cough or Shortness of Breath, No Abdominal pain, No Nausea or Vommitting, Bowel movements are regular, No Blood in stool or Urine, No dysuria,  No new joints pains-aches,  No new weakness, tingling, numbness in any extremity, No recent weight gain or loss, No polyuria, polydypsia or polyphagia, No significant Mental Stressors.  A full 10 point Review of Systems was done, except as stated above, all other Review of Systems were negative.   With Past History of the following :    Past Medical History:  Diagnosis Date  . Anxiety   . Arthritis   . Attention to nephrostomy ([place])    PT HAS NEPHROSTOMY TUBE IN PLACE  . Bruises easily   . Cancer [facility])    Skin cancer  . Chronic diarrhea   . Colitis   . COPD (chronic obstructive pulmonary disease) ([place])   . Dementia   . Depression   . GERD (gastroesophageal reflux disease)   . Hiatal hernia   . Hx of bronchitis   . Hx of pulmonary edema JULY  2015  . Hx of pulmonary edema JULY 2015  . Hx of septic shock JULY 2015  . Hydronephrosis, left   . Hyperlipidemia   . Memory difficulties   . Microscopic colitis   . Myocardial infarction    unknown time   . Seizures ([place])    last seizure was 4-5 years ago with "brain bleed"  . Shortness of breath    with exertion  . Stroke [facility])    TIA four years ago / stroke NOV 2014      Past Surgical History:  Procedure Laterality Date  . APPENDECTOMY    . BREAST BIOPSY Right 02/02/2014   Procedure: RIGHT BREAST BIOPSY;  Surgeon: Jamesetta So, MD;  Location: [facility] ORS;  Service: General;  Laterality: Right;  . COLONOSCOPY N/A 05/24/2013  Procedure: COLONOSCOPY;  Surgeon: Rogene Houston, MD;  Location: [facility] ENDO SUITE;  Service: Endoscopy;  Laterality: N/A;  100  . CYSTOSCOPY W/ URETERAL STENT PLACEMENT Left 08/06/2015   Procedure: CYSTOSCOPY WITH LEFT URETERAL STENT EXCHANGE - Sammie Bench;  Surgeon: Franchot Gallo, MD;  Location: [facility] ORS;  Service: Urology;  Laterality: Left;  . CYSTOSCOPY WITH RETROGRADE PYELOGRAM, URETEROSCOPY AND STENT PLACEMENT Left 09/18/2013   Procedure: CYSTOSCOPY WITH RETROGRADE PYELOGRAM,  URETEROSCOPY AND  STENT PLACEMENT, removal of nephrostomy tube;  Surgeon: Jorja Loa, MD;  Location: [facility] ORS;  Service: Urology;  Laterality: Left;  . CYSTOSCOPY WITH STENT PLACEMENT Left 09/04/2014   Procedure: CYSTOSCOPY WITH LEFT J2 STENT EXCHANGE;  Surgeon: Franchot Gallo, MD;  Location: [facility] ORS;  Service: Urology;  Laterality: Left;  . ESOPHAGOGASTRODUODENOSCOPY N/A 05/24/2013   Procedure: ESOPHAGOGASTRODUODENOSCOPY (EGD);  Surgeon: Rogene Houston, MD;  Location: [facility] ENDO SUITE;  Service: Endoscopy;  Laterality: N/A;  . KNEE ARTHROSCOPY Right   . SKIN CANCER EXCISION    . TONSILLECTOMY    . TUBAL LIGATION        Social History:     Social History  Substance Use Topics  . Smoking status: Former Smoker    Packs/day: 1.00    Years: 50.00    Types: Cigarettes    Quit  date: 11/13/1997  . Smokeless tobacco: Never Used  . Alcohol use 4.2 oz/week    7 Glasses of wine per week     Comment: 2 glasses a night wine (none in a year)     Lives - at home  Mobility -   Walks by self w cane   Family History :     Family History  Problem Relation Age of Onset  . Diabetes Mother   . Stroke Mother   . Heart attack Father       Home Medications:   Prior to Admission medications   Medication Sig Start Date End Date Taking? Authorizing Provider  albuterol (PROVENTIL HFA;VENTOLIN HFA) 108 (90 BASE) MCG/ACT inhaler Inhale 2 puffs into the lungs every 6 (six) hours as needed for wheezing or shortness of breath. 08/03/13  Yes Janece Canterbury, MD  atorvastatin (LIPITOR) 80 MG tablet Take 1 tablet (80 mg total) by mouth daily at 6 PM. Patient taking differently: Take 40 mg by mouth daily at 6 PM.  08/03/13  Yes Janece Canterbury, MD  budesonide (ENTOCORT EC) 3 MG 24 hr capsule Take 6 mg by mouth every morning.  03/20/15  Yes Historical Provider, MD  budesonide-formoterol (SYMBICORT) 160-4.5 MCG/ACT inhaler Inhale 2 puffs into the lungs 2 (two) times daily. 06/13/14  Yes Samuella Cota, MD  citalopram (CELEXA) 10 MG tablet TAKE 1 TABLET (10 MG TOTAL) BY MOUTH DAILY. 06/11/15  Yes Garvin Fila, MD  levETIRAcetam (KEPPRA) 250 MG tablet Take 1 tablet (250 mg total) by mouth 2 (two) times daily. 03/20/15  Yes Ward Givens, NP  loperamide (IMODIUM) 2 MG capsule Take 1 capsule (2 mg total) by mouth daily before breakfast. Patient taking differently: Take 2 mg by mouth as needed for diarrhea or loose stools.  04/09/15  Yes Rogene Houston, MD  megestrol (MEGACE) 40 MG tablet Take 80 mg by mouth daily. 10/15/15  Yes Historical Provider, MD  Methylfol-Algae-B12-Acetylcyst (CEREFOLIN NAC) 6-90.314-2-600 MG TABS Take 1 tablet by mouth daily. 10/18/14  Yes Garvin Fila, MD  pantoprazole (PROTONIX) 40 MG tablet TAKE ONE TABLET ONCE DAILY WITH BREAKFAST 10/23/15  Yes Butch Penny,  NP  solifenacin (VESICARE) 5 MG tablet Take 5 mg by mouth daily.   Yes Historical Provider, MD  SPIRIVA HANDIHALER 18 MCG inhalation capsule Place 1 capsule into inhaler and inhale daily. 05/20/14  Yes Historical Provider, MD  memantine (NAMENDA) 5 MG tablet SIG:  Take one tablet daily for one week  then: take one tablet in AM and one tablet in PM for one week.  then: take one tablet in AM and two tablets in PM for one week.  then: take two tablets in AM and two tablets in PM. Patient not taking: Reported on 10/01/2015 09/26/15   Ward Givens, NP     Allergies:    PCN   Physical Exam:   Vitals  Blood pressure 96/74, pulse 88, temperature 98 F (36.7 C), temperature source Oral, resp. rate 18, SpO2 97 %.   1. General lying in bed in NAD,    2. Normal affect and insight, Not Suicidal or Homicidal, Awake Alert, Oriented X 3.  3. No F.N deficits, ALL C.Nerves Intact, Strength 5/5 all 4 extremities, Sensation intact all 4 extremities, Plantars down going.  4. Ears and Eyes appear Normal, Conjunctivae clear, PERRLA. Moist Oral Mucosa.  5. Supple Neck, No JVD, No cervical lymphadenopathy appriciated, No Carotid Bruits.  6. Symmetrical Chest wall movement, Good air movement bilaterally, CTAB.  7. RRR, No Gallops, Rubs or Murmurs, No Parasternal Heave.  8. Positive Bowel Sounds, Abdomen Soft, No tenderness, No organomegaly appriciated,No rebound -guarding or rigidity.  9.  No Cyanosis, Normal Skin Turgor,redness extending from the wound on the right mid shin upwards to just above the knee on the lateral aspect  10. Good muscle tone,  joints appear normal , no effusions, Normal ROM.  11. No Palpable Lymph Nodes in Neck or Axillae    Data Review:    CBC  Recent Labs Lab 11/01/15 1811  WBC 13.4*  HGB 11.8*  HCT 35.3*  PLT 215  MCV 96.7  MCH 32.3  MCHC 33.4  RDW 15.5  LYMPHSABS 0.9  MONOABS 1.1*  EOSABS 0.1  BASOSABS 0.0    ------------------------------------------------------------------------------------------------------------------  Chemistries   Recent Labs Lab 11/01/15 1811  NA 134*  K 3.0*  CL 104  CO2 23  GLUCOSE 114*  BUN 17  CREATININE 1.10*  CALCIUM 9.0   ------------------------------------------------------------------------------------------------------------------ CrCl cannot be calculated (Unknown ideal weight.). ------------------------------------------------------------------------------------------------------------------ No results for input(s): TSH, T4TOTAL, T3FREE, THYROIDAB in the last 72 hours.  Invalid input(s): FREET3  Coagulation profile No results for input(s): INR, PROTIME in the last 168 hours. ------------------------------------------------------------------------------------------------------------------- No results for input(s): DDIMER in the last 72 hours. -------------------------------------------------------------------------------------------------------------------  Cardiac Enzymes No results for input(s): CKMB, TROPONINI, MYOGLOBIN in the last 168 hours.  Invalid input(s): CK ------------------------------------------------------------------------------------------------------------------    Component Value Date/Time   BNP 14.0 06/12/2014 0445     ---------------------------------------------------------------------------------------------------------------  Urinalysis    Component Value Date/Time   COLORURINE YELLOW 11/01/2015 1755   APPEARANCEUR HAZY (A) 11/01/2015 1755   LABSPEC <1.005 (L) 11/01/2015 1755   PHURINE 6.0 11/01/2015 1755   GLUCOSEU NEGATIVE 11/01/2015 1755   HGBUR SMALL (A) 11/01/2015 [place] NEGATIVE 11/01/2015 1755   KETONESUR NEGATIVE 11/01/2015 1755   PROTEINUR NEGATIVE 11/01/2015 1755   UROBILINOGEN 0.2 07/26/2013 2234   NITRITE POSITIVE (A) 11/01/2015 1755   LEUKOCYTESUR LARGE (A) 11/01/2015 1755     ----------------------------------------------------------------------------------------------------------------   Imaging Results:    Dg Tibia/fibula Right  Result Date: 11/01/2015 CLINICAL DATA:  Chronic wound from a fall.  Swelling  and pain EXAM: RIGHT TIBIA AND FIBULA - 2 VIEW COMPARISON:  None. FINDINGS: No acute fracture, malalignment, periostitis, or bone destruction. Moderate diffuse soft tissue swelling. Nonspecific soft tissue calcifications posterior calf. Vascular calcifications. Probable phleboliths distal posterior soft tissues. Bones appear osteopenic. Moderate narrowing and spurring of the medial joint space compartment. Mild narrowing and spurring of the patellofemoral and lateral compartments. Possible faint joint space calcification suggests chondrocalcinosis. IMPRESSION: 1. No fracture or suspicious bone lesion identified. 2. Moderate degenerative changes 3. Suspect chondrocalcinosis. 4. Vascular calcification Electronically Signed   By: Donavan Foil M.D.   On: 11/01/2015 19:20      Assessment & Plan:    Principal Problem:   Cellulitis Active Problems:   Open wound of leg   COPD (chronic obstructive pulmonary disease) (H[facility])   Hypokalemia   Anemia   Hyponatremia   Wound cellulitis    1.  Cellulitis right distal lower ext Check ESR vanco iv pharmacy to dose, levaquin iv pharmacy to dose  2. Skin wound on the mid right distal lower ext Wound care nurse consult  3. Hypokalemia Replete Check cmp in am  4. Anemia Check cbc in am  5. Copd Cont current medications  6. Hyponatremia Check cmp in am  7. UTI Vanco/levaquin iv Await culture  DVT Prophylaxis  Lovenox - SCDs   AM Labs Ordered, also please review Full Orders  Family Communication: Admission, patients condition and plan of care including tests being ordered have been discussed with the patient  who indicate understanding and agree with the plan and Code Status.  Code Status   DNR  Likely DC to  home  Condition GUARDED    Consults called:  none  Admission status: observation  Time spent in minutes : 45 minutes   Jani Gravel M.D on 11/01/2015 at 8:15 PM  Between 7am to 7pm - Pager - [phone]. After 7pm go to [email or web address] - password [facility]  [facility] - Office  [phone]

## 2015-11-01 NOTE — ED Provider Notes (Signed)
[facility] DEPT Provider Note   CSN: [id number] Arrival date & time: 11/01/15  1657     History   Chief Complaint Chief Complaint  Patient presents with  . Leg Pain    HPI Bridget Ball is a 82 y.o. female.  HPI  82 year old female who presents with right leg pain and swelling. History of mild dementia, HLD, and prior CVA. States chronic wound from fall for several months. More recently scab over wound on right shin peeled off. Over past 2 days with increased drainage from wound. Increased redness, swelling and pain to leg as well. With tactile fever at home. C/o associating generalized weakness. No nausea, vomiting, abd pain, sob, chest pain.   Past Medical History:  Diagnosis Date  . Anxiety   . Arthritis   . Attention to nephrostomy ([place])    PT HAS NEPHROSTOMY TUBE IN PLACE  . Bruises easily   . Cancer [facility])    Skin cancer  . Chronic diarrhea   . Colitis   . COPD (chronic obstructive pulmonary disease) ([place])   . Dementia   . Depression   . GERD (gastroesophageal reflux disease)   . Hiatal hernia   . Hx of bronchitis   . Hx of pulmonary edema JULY 2015  . Hx of pulmonary edema JULY 2015  . Hx of septic shock JULY 2015  . Hydronephrosis, left   . Hyperlipidemia   . Memory difficulties   . Microscopic colitis   . Myocardial infarction    unknown time   . Seizures ([place])    last seizure was 4-5 years ago with "brain bleed"  . Shortness of breath    with exertion  . Stroke [facility])    TIA four years ago / stroke NOV 2014    Patient Active Problem List   Diagnosis Date Noted  . Cellulitis 11/01/2015  . Hypokalemia 11/01/2015  . Anemia 11/01/2015  . Hyponatremia 11/01/2015  . Wound cellulitis 11/01/2015  . Bronchospasm 06/12/2014  . Acute respiratory failure with hypoxia ([place]) 06/12/2014  . Allergic reaction   . Esophageal reflux 08/17/2013  . COPD (chronic obstructive pulmonary disease) ([place]) 08/11/2013  . Acute pyelonephritis 08/03/2013  . Septic  shock due to Enterococcus species ([place]) 08/03/2013  . Septicemia ([place]) 08/03/2013  . Gram negative septic shock ([place]) 08/03/2013  . NSTEMI (non-ST elevated myocardial infarction) type 2 07/26/2013  . Dehydration 07/25/2013  . AKI (acute kidney injury) ([place]) 07/25/2013  . Elevated troponin 07/25/2013  . Colitis 07/08/2013  . Nausea and vomiting 07/08/2013  . Abdominal pain, unspecified site 07/08/2013  . Hydronephrosis, left 07/08/2013  . Microscopic colitis 06/27/2013  . Memory loss 04/25/2013  . Depression 04/25/2013  . Loss of weight 04/20/2013  . Melena 04/20/2013  . Diarrhea 04/20/2013  . SAH (subarachnoid hemorrhage) ([place]) 11/22/2012  . Seizures secondary to [facility] 11/22/2012  . Bilateral lower leg cellulitis 11/13/2012  . Open wound of leg 11/13/2012  . Oral thrush 11/13/2012    Past Surgical History:  Procedure Laterality Date  . APPENDECTOMY    . BREAST BIOPSY Right 02/02/2014   Procedure: RIGHT BREAST BIOPSY;  Surgeon: Jamesetta So, MD;  Location: [facility] ORS;  Service: General;  Laterality: Right;  . COLONOSCOPY N/A 05/24/2013   Procedure: COLONOSCOPY;  Surgeon: Rogene Houston, MD;  Location: [facility] ENDO SUITE;  Service: Endoscopy;  Laterality: N/A;  100  . CYSTOSCOPY W/ URETERAL STENT PLACEMENT Left 08/06/2015   Procedure: CYSTOSCOPY WITH LEFT URETERAL STENT EXCHANGE - JJ  STENT;  Surgeon: Franchot Gallo, MD;  Location: [facility] ORS;  Service: Urology;  Laterality: Left;  . CYSTOSCOPY WITH RETROGRADE PYELOGRAM, URETEROSCOPY AND STENT PLACEMENT Left 09/18/2013   Procedure: CYSTOSCOPY WITH RETROGRADE PYELOGRAM,  URETEROSCOPY AND  STENT PLACEMENT, removal of nephrostomy tube;  Surgeon: Jorja Loa, MD;  Location: [facility] ORS;  Service: Urology;  Laterality: Left;  . CYSTOSCOPY WITH STENT PLACEMENT Left 09/04/2014   Procedure: CYSTOSCOPY WITH LEFT J2 STENT EXCHANGE;  Surgeon: Franchot Gallo, MD;  Location: [facility] ORS;  Service: Urology;  Laterality: Left;  . ESOPHAGOGASTRODUODENOSCOPY N/A  05/24/2013   Procedure: ESOPHAGOGASTRODUODENOSCOPY (EGD);  Surgeon: Rogene Houston, MD;  Location: [facility] ENDO SUITE;  Service: Endoscopy;  Laterality: N/A;  . KNEE ARTHROSCOPY Right   . SKIN CANCER EXCISION    . TONSILLECTOMY    . TUBAL LIGATION      OB History    No data available       Home Medications    Prior to Admission medications   Medication Sig Start Date End Date Taking? Authorizing Provider  albuterol (PROVENTIL HFA;VENTOLIN HFA) 108 (90 BASE) MCG/ACT inhaler Inhale 2 puffs into the lungs every 6 (six) hours as needed for wheezing or shortness of breath. 08/03/13  Yes Janece Canterbury, MD  atorvastatin (LIPITOR) 80 MG tablet Take 1 tablet (80 mg total) by mouth daily at 6 PM. Patient taking differently: Take 40 mg by mouth daily at 6 PM.  08/03/13  Yes Janece Canterbury, MD  budesonide (ENTOCORT EC) 3 MG 24 hr capsule Take 6 mg by mouth every morning.  03/20/15  Yes Historical Provider, MD  budesonide-formoterol (SYMBICORT) 160-4.5 MCG/ACT inhaler Inhale 2 puffs into the lungs 2 (two) times daily. 06/13/14  Yes Samuella Cota, MD  citalopram (CELEXA) 10 MG tablet TAKE 1 TABLET (10 MG TOTAL) BY MOUTH DAILY. 06/11/15  Yes Garvin Fila, MD  levETIRAcetam (KEPPRA) 250 MG tablet Take 1 tablet (250 mg total) by mouth 2 (two) times daily. 03/20/15  Yes Ward Givens, NP  loperamide (IMODIUM) 2 MG capsule Take 1 capsule (2 mg total) by mouth daily before breakfast. Patient taking differently: Take 2 mg by mouth as needed for diarrhea or loose stools.  04/09/15  Yes Rogene Houston, MD  megestrol (MEGACE) 40 MG tablet Take 80 mg by mouth daily. 10/15/15  Yes Historical Provider, MD  Methylfol-Algae-B12-Acetylcyst (CEREFOLIN NAC) 6-90.314-2-600 MG TABS Take 1 tablet by mouth daily. 10/18/14  Yes Garvin Fila, MD  pantoprazole (PROTONIX) 40 MG tablet TAKE ONE TABLET ONCE DAILY WITH BREAKFAST 10/23/15  Yes Butch Penny, NP  solifenacin (VESICARE) 5 MG tablet Take 5 mg by mouth daily.   Yes  Historical Provider, MD  SPIRIVA HANDIHALER 18 MCG inhalation capsule Place 1 capsule into inhaler and inhale daily. 05/20/14  Yes Historical Provider, MD  memantine (NAMENDA) 5 MG tablet SIG:  Take one tablet daily for one week  then: take one tablet in AM and one tablet in PM for one week.  then: take one tablet in AM and two tablets in PM for one week.  then: take two tablets in AM and two tablets in PM. Patient not taking: Reported on 10/01/2015 09/26/15   Ward Givens, NP    Family History Family History  Problem Relation Age of Onset  . Diabetes Mother   . Stroke Mother   . Heart attack Father     Social History Social History  Substance Use Topics  . Smoking status: Former Smoker  Packs/day: 1.00    Years: 50.00    Types: Cigarettes    Quit date: 11/13/1997  . Smokeless tobacco: Never Used  . Alcohol use 4.2 oz/week    7 Glasses of wine per week     Comment: 2 glasses a night wine (none in a year)     Allergies   Penicillins   Review of Systems Review of Systems 10/14 systems reviewed and are negative other than those stated in the HPI   Physical Exam Updated Vital Signs BP 96/74 (BP Location: Left Arm)   Pulse 88   Temp 98 F (36.7 C) (Oral)   Resp 18   SpO2 97%   Physical Exam Physical Exam  Nursing note and vitals reviewed. Constitutional: Well developed, well nourished, non-toxic, and in no acute distress Head: Normocephalic and atraumatic.  Mouth/Throat: Oropharynx is clear and moist.  Neck: Normal range of motion. Neck supple.  Cardiovascular: Normal rate and regular rhythm.   Pulmonary/Chest: Effort normal and breath sounds normal. +2 DP pulses Abdominal: Soft. There is no tenderness. There is no rebound and no guarding.  Musculoskeletal: Normal range of motion.  Neurological: Alert, no facial droop, fluent speech, moves all extremities symmetrically Skin: Skin is warm and dry. open ulcer over anterior right shin with no active drainage.  Surrounding warmth and erythema around calf to ankle and up lateral thigh Psychiatric: Cooperative   ED Treatments / Results  Labs (all labs ordered are listed, but only abnormal results are displayed) Labs Reviewed  CBC WITH DIFFERENTIAL/PLATELET - Abnormal; Notable for the following:       Result Value   WBC 13.4 (*)    RBC 3.65 (*)    Hemoglobin 11.8 (*)    HCT 35.3 (*)    Neutro Abs 11.2 (*)    Monocytes Absolute 1.1 (*)    All other components within normal limits  BASIC METABOLIC PANEL - Abnormal; Notable for the following:    Sodium 134 (*)    Potassium 3.0 (*)    Glucose, Bld 114 (*)    Creatinine, Ser 1.10 (*)    GFR calc non Af Amer 44 (*)    GFR calc Af Amer 52 (*)    All other components within normal limits  URINALYSIS, ROUTINE W REFLEX MICROSCOPIC (NOT AT [facility]) - Abnormal; Notable for the following:    APPearance HAZY (*)    Specific Gravity, Urine <1.005 (*)    Hgb urine dipstick SMALL (*)    Nitrite POSITIVE (*)    Leukocytes, UA LARGE (*)    All other components within normal limits  URINE MICROSCOPIC-ADD ON - Abnormal; Notable for the following:    Squamous Epithelial / LPF 6-30 (*)    Bacteria, UA MANY (*)    All other components within normal limits    EKG  EKG Interpretation None       Radiology Dg Tibia/fibula Right  Result Date: 11/01/2015 CLINICAL DATA:  Chronic wound from a fall.  Swelling and pain EXAM: RIGHT TIBIA AND FIBULA - 2 VIEW COMPARISON:  None. FINDINGS: No acute fracture, malalignment, periostitis, or bone destruction. Moderate diffuse soft tissue swelling. Nonspecific soft tissue calcifications posterior calf. Vascular calcifications. Probable phleboliths distal posterior soft tissues. Bones appear osteopenic. Moderate narrowing and spurring of the medial joint space compartment. Mild narrowing and spurring of the patellofemoral and lateral compartments. Possible faint joint space calcification suggests chondrocalcinosis.  IMPRESSION: 1. No fracture or suspicious bone lesion identified. 2. Moderate degenerative changes 3. Suspect  chondrocalcinosis. 4. Vascular calcification Electronically Signed   By: Donavan Foil M.D.   On: 11/01/2015 19:20    Procedures Procedures (including critical care time)  Medications Ordered in ED Medications  potassium chloride (KLOR-CON) packet 40 mEq (not administered)  sodium chloride 0.9 % bolus 250 mL (250 mLs Intravenous New Bag/Given 11/01/15 1843)  vancomycin (VANCOCIN) IVPB 1000 mg/200 mL premix (1,000 mg Intravenous New Bag/Given 11/01/15 1843)     Initial Impression / Assessment and Plan / ED Course  I have reviewed the triage vital signs and the nursing notes.  Pertinent labs & imaging results that were available during my care of the patient were reviewed by me and considered in my medical decision making (see chart for details).  Clinical Course    Presenting with cellulitis of the right lower extremity. He is nontoxic in no acute distress, afebrile and hemodynamically stable. Blood work with a leukocytosis of 13.4. There is a small open wound over the anterior shin, without active drainage, but there is surrounding erythema, warmth extending from the ankle up to the lateral thigh. Patient lives alone and given the extensive nature of her cellulitis I feel that she may require observation in the hospital. She is started on vancomycin due to penicillin anaphylaxis. Discussed with Dr. Maudie Mercury who will admit to observation.  Final Clinical Impressions(s) / ED Diagnoses   Final diagnoses:  Wound cellulitis  Cellulitis of right leg    New Prescriptions New Prescriptions   No medications on file     Forde Dandy, MD 11/01/15 2013

## 2015-11-02 DIAGNOSIS — E871 Hypo-osmolality and hyponatremia: Secondary | ICD-10-CM | POA: Diagnosis not present

## 2015-11-02 DIAGNOSIS — L03115 Cellulitis of right lower limb: Secondary | ICD-10-CM | POA: Diagnosis not present

## 2015-11-02 DIAGNOSIS — E876 Hypokalemia: Secondary | ICD-10-CM

## 2015-11-02 DIAGNOSIS — L0390 Cellulitis, unspecified: Secondary | ICD-10-CM | POA: Diagnosis not present

## 2015-11-02 LAB — COMPREHENSIVE METABOLIC PANEL
ALBUMIN: 3.2 g/dL — AB (ref 3.5–5.0)
ALK PHOS: 35 U/L — AB (ref 38–126)
ALT: 15 U/L (ref 14–54)
ANION GAP: 6 (ref 5–15)
AST: 14 U/L — AB (ref 15–41)
BILIRUBIN TOTAL: 0.9 mg/dL (ref 0.3–1.2)
BUN: 12 mg/dL (ref 6–20)
CALCIUM: 8.8 mg/dL — AB (ref 8.9–10.3)
CO2: 22 mmol/L (ref 22–32)
Chloride: 112 mmol/L — ABNORMAL HIGH (ref 101–111)
Creatinine, Ser: 0.8 mg/dL (ref 0.44–1.00)
GFR calc Af Amer: >60 mL/min (ref >60)
GFR calc non Af Amer: >60 mL/min (ref >60)
GLUCOSE: 89 mg/dL (ref 65–99)
Potassium: 3.7 mmol/L (ref 3.5–5.1)
SODIUM: 140 mmol/L (ref 135–145)
TOTAL PROTEIN: 5.7 g/dL — AB (ref 6.5–8.1)

## 2015-11-02 LAB — CBC
HEMATOCRIT: 31.7 % — AB (ref 36.0–46.0)
HEMOGLOBIN: 10.5 g/dL — AB (ref 12.0–15.0)
MCH: 32.4 pg (ref 26.0–34.0)
MCHC: 33.1 g/dL (ref 30.0–36.0)
MCV: 97.8 fL (ref 78.0–100.0)
Platelets: 196 10*3/uL (ref 150–400)
RBC: 3.24 MIL/uL — ABNORMAL LOW (ref 3.87–5.11)
RDW: 15.6 % — AB (ref 11.5–15.5)
WBC: 10.1 10*3/uL (ref 4.0–10.5)

## 2015-11-02 LAB — SEDIMENTATION RATE: SED RATE: 66 mm/h — AB (ref 0–22)

## 2015-11-02 MED ORDER — TRAMADOL HCL 50 MG PO TABS
50.0000 mg | ORAL_TABLET | Freq: Two times a day (BID) | ORAL | Status: DC | PRN
  Administered 2015-11-02: 50 mg via ORAL
  Filled 2015-11-02: qty 1

## 2015-11-02 MED ORDER — ENOXAPARIN SODIUM 30 MG/0.3ML ~~LOC~~ SOLN
30.0000 mg | SUBCUTANEOUS | Status: DC
  Administered 2015-11-03: 30 mg via SUBCUTANEOUS

## 2015-11-02 MED ORDER — LEVOFLOXACIN IN D5W 250 MG/50ML IV SOLN
250.0000 mg | INTRAVENOUS | Status: DC
  Administered 2015-11-02: 250 mg via INTRAVENOUS
  Filled 2015-11-02 (×2): qty 50

## 2015-11-02 MED ORDER — SILVER SULFADIAZINE 1 % EX CREA
TOPICAL_CREAM | Freq: Two times a day (BID) | CUTANEOUS | Status: DC
  Administered 2015-11-02 – 2015-11-04 (×5): via TOPICAL
  Filled 2015-11-02: qty 50

## 2015-11-02 MED ORDER — ENOXAPARIN SODIUM 60 MG/0.6ML ~~LOC~~ SOLN
1.5000 mg/kg | Freq: Once | SUBCUTANEOUS | Status: AC
  Administered 2015-11-02: 60 mg via SUBCUTANEOUS
  Filled 2015-11-02: qty 0.6

## 2015-11-02 MED ORDER — VANCOMYCIN HCL IN DEXTROSE 750-5 MG/150ML-% IV SOLN
750.0000 mg | INTRAVENOUS | Status: DC
  Administered 2015-11-02 – 2015-11-03 (×2): 750 mg via INTRAVENOUS
  Filled 2015-11-02 (×3): qty 150

## 2015-11-02 NOTE — Progress Notes (Signed)
ANTIBIOTIC CONSULT NOTE  Pharmacy Consult for Vancomycin and Levaquin Indication: cellulitis and UTI  Allergies: PCN noted Patient Measurements: Height: 5\' 1"  (154.9 cm) Weight: 91 lb 12.8 oz (41.6 kg) IBW/kg (Calculated) : 47.8  Vital Signs: Temp: 97.9 F (36.6 C) (10/28 0510) Temp Source: Oral (10/28 0510) BP: 101/62 (10/28 0510) Pulse Rate: 116 (10/28 0510)  Labs:  Recent Labs  11/01/15 1811 11/02/15 0434  WBC 13.4* 10.1  HGB 11.8* 10.5*  PLT 215 196  CREATININE 1.10* 0.80   Estimated Creatinine Clearance: 33.8 mL/min (by C-G formula based on SCr of 0.8 mg/dL).  No results for input(s): VANCOTROUGH, VANCOPEAK, VANCORANDOM, GENTTROUGH, GENTPEAK, GENTRANDOM, TOBRATROUGH, TOBRAPEAK, TOBRARND, AMIKACINPEAK, AMIKACINTROU, AMIKACIN in the last 72 hours.   Microbiology: No results found for this or any previous visit (from the past 720 hour(s)).  Medical History: Past Medical History:  Diagnosis Date  . Anxiety   . Arthritis   . Attention to nephrostomy ([place])    PT HAS NEPHROSTOMY TUBE IN PLACE  . Bruises easily   . Cancer [facility])    Skin cancer  . Chronic diarrhea   . Colitis   . COPD (chronic obstructive pulmonary disease) ([place])   . Dementia   . Depression   . GERD (gastroesophageal reflux disease)   . Hiatal hernia   . Hx of bronchitis   . Hx of pulmonary edema JULY 2015  . Hx of pulmonary edema JULY 2015  . Hx of septic shock JULY 2015  . Hydronephrosis, left   . Hyperlipidemia   . Memory difficulties   . Microscopic colitis   . Myocardial infarction    unknown time   . Seizures ([place])    last seizure was 4-5 years ago with "brain bleed"  . Shortness of breath    with exertion  . Stroke [facility])    TIA four years ago / stroke NOV 2014   Medications:  Prescriptions Prior to Admission  Medication Sig Dispense Refill Last Dose  . albuterol (PROVENTIL HFA;VENTOLIN HFA) 108 (90 BASE) MCG/ACT inhaler Inhale 2 puffs into the lungs every 6 (six) hours as  needed for wheezing or shortness of breath. 1 Inhaler 0 10/31/2015 at Unknown time  . atorvastatin (LIPITOR) 80 MG tablet Take 1 tablet (80 mg total) by mouth daily at 6 PM. (Patient taking differently: Take 40 mg by mouth daily at 6 PM. ) 30 tablet 0 10/31/2015 at Unknown time  . budesonide (ENTOCORT EC) 3 MG 24 hr capsule Take 6 mg by mouth every morning.    10/31/2015 at Unknown time  . budesonide-formoterol (SYMBICORT) 160-4.5 MCG/ACT inhaler Inhale 2 puffs into the lungs 2 (two) times daily. 1 Inhaler 0 11/01/2015 at Unknown time  . citalopram (CELEXA) 10 MG tablet TAKE 1 TABLET (10 MG TOTAL) BY MOUTH DAILY. 90 tablet 1 10/31/2015 at Unknown time  . levETIRAcetam (KEPPRA) 250 MG tablet Take 1 tablet (250 mg total) by mouth 2 (two) times daily. 60 tablet 11 10/31/2015 at 2100  . loperamide (IMODIUM) 2 MG capsule Take 1 capsule (2 mg total) by mouth daily before breakfast. (Patient taking differently: Take 2 mg by mouth as needed for diarrhea or loose stools. ) 30 capsule 0 unknown  . megestrol (MEGACE) 40 MG tablet Take 80 mg by mouth daily.   10/31/2015 at Unknown time  . Methylfol-Algae-B12-Acetylcyst (CEREFOLIN NAC) 6-90.314-2-600 MG TABS Take 1 tablet by mouth daily. 90 tablet 3 10/31/2015 at Unknown time  . pantoprazole (PROTONIX) 40 MG tablet TAKE ONE TABLET  ONCE DAILY WITH BREAKFAST 30 tablet 5 10/31/2015 at Unknown time  . solifenacin (VESICARE) 5 MG tablet Take 5 mg by mouth daily.   10/31/2015 at Unknown time  . SPIRIVA HANDIHALER 18 MCG inhalation capsule Place 1 capsule into inhaler and inhale daily.  1 10/31/2015 at Unknown time  . memantine (NAMENDA) 5 MG tablet SIG:  Take one tablet daily for one week  then: take one tablet in AM and one tablet in PM for one week.  then: take one tablet in AM and two tablets in PM for one week.  then: take two tablets in AM and two tablets in PM. (Patient not taking: Reported on 10/01/2015) 120 tablet 5 Not Taking   Assessment: 82yo female with  h/o COPD, dementia and sz d/o.  Reportedly with slight fever.  Asked to initiate Vancomycin and Levaquin for cellulitis and UTI.  Labs reviewed.  Elderly female with small body habitus.    Goal of Therapy:  Eradicate infection. Vancomycin trough level 10-15  Plan:  Vancomycin 750mg  IV q24h Check trough at steady state Levaquin 250mg  IV q24hrs Monitor labs, renal fxn, progress and c/s Deescalate ABX when improved / appropriate.    Hart Robinsons, PharmD Clinical Pharmacist Pager:  [phone] 11/02/2015   11/02/2015,7:56 AM

## 2015-11-02 NOTE — Consult Note (Addendum)
[facility] Nurse wound consult note Reason for Consult: RLE ulcer, also noted is chronic ulcer on the LLE, both in the pretibial areas. Wound type: Arterial insufficiency in the presence of cellulitis Pressure Ulcer POA: No Measurement: LLE:  2cm x 1cm area of full thickness tissue loss, depth obscured by the presence of dried serum (scab).  Patient and daughter report that she "knocked off scab a few days ago" and that there was "not too much bleeding".  RLE with full thickness ulceration measuring 3cm x 0.8cm with depth obscured by the presence of eschar. Wound bed:As described above Drainage (amount, consistency, odor) None Periwound: Marked erythema of the RLE to the mid-thigh.  I have asked RN to use a skin marking pen to indicate the line of demarcation today.  Bilateral LEs with evidence of previous wound healing of large, full thickness ulcers.  Patient's daughter indicates that patient has been a patient out the outatient wound care center here at [facility] for many months in the past, but is not currently being seen by them.  Recommend HHRN for continued care post discharge.  If you agree, please order. Dressing procedure/placement/frequency: I will provide Nursing with guidance via the Orders for twice daily wound care using silver sulfadiazine.  A pressure redistributing chair cushion is provided for, with LE elevation, it increases the pressure to her coccyx/sacral areas (depanding on positioning) and we wish to prevent pressure injury in this frail elder. [facility] nursing team will not follow, but will remain available to this patient, the nursing and medical teams.  Please re-consult if needed. Thanks, Maudie Flakes, MSN, RN, [place], Serita Grammes, [place]  Pager# [phone] .

## 2015-11-02 NOTE — Progress Notes (Signed)
Patient ID: Bridget Ball, female   DOB: 07/02/1928, 82 y.o.   MRN: [id number]   PROGRESS NOTE    Bridget Ball  [id number] DOB: 28-Feb-1928 DOA: 11/01/2015 PCP: [name], MD   Subjective: Patient continues to have pain in right lower extremity. She notes that she does have pronounced calf tenderness with some swelling. Denies SOB, CP.  Brief Narrative:   Bridget Ball  is a 82 y.o. female, w Copd not on home o2, Dementia, SAH, seizure disorder apparently presents with c/o right distal lower ext redness just noticed today by her daughter.  Slight subjective fever.  Slight pain in the right distal lower ext, pt was noted to have mild hypokalemia as well as hyponatremia.  pt will be admitted for evaluation of cellulitis.    Assessment & Plan:   Principal Problem:   Cellulitis Active Problems:   Open wound of leg   COPD (chronic obstructive pulmonary disease) (H[facility])   Hypokalemia   Anemia   Hyponatremia   Wound cellulitis  #1 Cellulitis  Continue vancomycin and Levaquin  As patient has significant calf tenderness and swelling, will give patient 1 time therapeutic dose of Lovenox and obtain right lower extremity ultrasound to rule out DVT  Still has significant cellulitis that extends past the knee joint. #2 Open Wound of Leg  Wound care nurse consulted - appreciate their assistance in caring for the patient  #3 hypokalemia  Improved #4 hyponatremia  Improved #5 anemia  Hemoglobin dropped to 10.5. Patient hemodynamically stable. We'll continue to watch for now.   DVT prophylaxis: Treatment dose of Lovenox tonight - ultrasound tomorrow to rule out DVT Code Status: DO NOT RESUSCITATE Family Communication: None Disposition Plan:   Consultants:   Wound care  Procedures:   Antimicrobials:   Vancomycin: Day 2  Levofloxacin: Day 2  Objective: Vitals:   11/02/15 0510 11/02/15 0811 11/02/15 1201 11/02/15 1404  BP: 101/62   93/77  Pulse: (!) 116   93  Resp:  18   18  Temp: 97.9 F (36.6 C)   98.3 F (36.8 C)  TempSrc: Oral   Oral  SpO2: 98% 96% 96% 97%  Weight: 41.6 kg (91 lb 12.8 oz)     Height:        Intake/Output Summary (Last 24 hours) at 11/02/15 1922 Last data filed at 11/02/15 1734  Gross per 24 hour  Intake           868.33 ml  Output                0 ml  Net           868.33 ml   Filed Weights   11/01/15 2148 11/02/15 0510  Weight: 41.3 kg (91 lb) 41.6 kg (91 lb 12.8 oz)    Examination:  General exam: Appears calm and comfortable  Respiratory system: Clear to auscultation. Respiratory effort normal. Cardiovascular system: S1 & S2 heard, RRR. No JVD, murmurs, rubs, gallops or clicks. No pedal edema. Gastrointestinal system: Abdomen is nondistended, soft and nontender. No organomegaly or masses felt. Normal bowel sounds heard. Central nervous system: Alert and oriented. No focal neurological deficits. Extremities: Symmetric 5 x 5 power. Skin: Significant erythema of right lower leg on the lateral aspect that extends up past knee joint. There is tenderness to the right calf with mild swelling. Psychiatry: Judgement and insight appear normal. Mood & affect appropriate.     Data Reviewed: I have personally reviewed following labs and imaging  studies  CBC:  Recent Labs Lab 11/01/15 1811 11/02/15 0434  WBC 13.4* 10.1  NEUTROABS 11.2*  --   HGB 11.8* 10.5*  HCT 35.3* 31.7*  MCV 96.7 97.8  PLT 215 123456   Basic Metabolic Panel:  Recent Labs Lab 11/01/15 1811 11/02/15 0434  NA 134* 140  K 3.0* 3.7  CL 104 112*  CO2 23 22  GLUCOSE 114* 89  BUN 17 12  CREATININE 1.10* 0.80  CALCIUM 9.0 8.8*   GFR: Estimated Creatinine Clearance: 33.8 mL/min (by C-G formula based on SCr of 0.8 mg/dL). Liver Function Tests:  Recent Labs Lab 11/02/15 0434  AST 14*  ALT 15  ALKPHOS 35*  BILITOT 0.9  PROT 5.7*  ALBUMIN 3.2*   No results for input(s): LIPASE, AMYLASE in the last 168 hours. No results for input(s):  AMMONIA in the last 168 hours. Coagulation Profile: No results for input(s): INR, PROTIME in the last 168 hours. Cardiac Enzymes: No results for input(s): CKTOTAL, CKMB, CKMBINDEX, TROPONINI in the last 168 hours. BNP (last 3 results) No results for input(s): PROBNP in the last 8760 hours. HbA1C: No results for input(s): HGBA1C in the last 72 hours. CBG: No results for input(s): GLUCAP in the last 168 hours. Lipid Profile: No results for input(s): CHOL, HDL, LDLCALC, TRIG, CHOLHDL, LDLDIRECT in the last 72 hours. Thyroid Function Tests: No results for input(s): TSH, T4TOTAL, FREET4, T3FREE, THYROIDAB in the last 72 hours. Anemia Panel: No results for input(s): VITAMINB12, FOLATE, FERRITIN, TIBC, IRON, RETICCTPCT in the last 72 hours. Sepsis Labs: No results for input(s): PROCALCITON, LATICACIDVEN in the last 168 hours.  No results found for this or any previous visit (from the past 240 hour(s)).       Radiology Studies: Dg Tibia/fibula Right  Result Date: 11/01/2015 CLINICAL DATA:  Chronic wound from a fall.  Swelling and pain EXAM: RIGHT TIBIA AND FIBULA - 2 VIEW COMPARISON:  None. FINDINGS: No acute fracture, malalignment, periostitis, or bone destruction. Moderate diffuse soft tissue swelling. Nonspecific soft tissue calcifications posterior calf. Vascular calcifications. Probable phleboliths distal posterior soft tissues. Bones appear osteopenic. Moderate narrowing and spurring of the medial joint space compartment. Mild narrowing and spurring of the patellofemoral and lateral compartments. Possible faint joint space calcification suggests chondrocalcinosis. IMPRESSION: 1. No fracture or suspicious bone lesion identified. 2. Moderate degenerative changes 3. Suspect chondrocalcinosis. 4. Vascular calcification Electronically Signed   By: Donavan Foil M.D.   On: 11/01/2015 19:20        Scheduled Meds: . atorvastatin  40 mg Oral q1800  . budesonide  6 mg Oral q morning - 10a    . citalopram  10 mg Oral Daily  . darifenacin  7.5 mg Oral Daily  . enoxaparin (LOVENOX) injection  30 mg Subcutaneous Q24H  . levETIRAcetam  250 mg Oral BID  . levofloxacin (LEVAQUIN) IV  250 mg Intravenous Q24H  . megestrol  80 mg Oral Daily  . mometasone-formoterol  2 puff Inhalation BID  . pantoprazole  40 mg Oral Daily  . silver sulfADIAZINE   Topical BID  . tiotropium  1 capsule Inhalation Daily  . vancomycin  750 mg Intravenous Q24H   Continuous Infusions:    LOS: 0 days    Time spent: [place], DO [facility]  If 7PM-7AM, please contact night-coverage [email or web address] Password [email or web address] 11/02/2015, 7:22 PM

## 2015-11-02 NOTE — Progress Notes (Addendum)
Called by RN D/w Flow Manager that pt was left w my name as attending and not seen by [facility]  Her pcp is [employer] .  Flow manager will arrange for [facility] physician to see the patient.

## 2015-11-03 ENCOUNTER — Observation Stay (HOSPITAL_COMMUNITY): Payer: Medicare Other

## 2015-11-03 DIAGNOSIS — L03115 Cellulitis of right lower limb: Secondary | ICD-10-CM | POA: Diagnosis not present

## 2015-11-03 DIAGNOSIS — S81809A Unspecified open wound, unspecified lower leg, initial encounter: Secondary | ICD-10-CM | POA: Diagnosis not present

## 2015-11-03 DIAGNOSIS — M7989 Other specified soft tissue disorders: Secondary | ICD-10-CM | POA: Diagnosis not present

## 2015-11-03 LAB — BASIC METABOLIC PANEL
Anion gap: 5 (ref 5–15)
BUN: 10 mg/dL (ref 6–20)
CALCIUM: 8.9 mg/dL (ref 8.9–10.3)
CO2: 22 mmol/L (ref 22–32)
CREATININE: 0.76 mg/dL (ref 0.44–1.00)
Chloride: 111 mmol/L (ref 101–111)
GFR calc non Af Amer: >60 mL/min (ref >60)
Glucose, Bld: 103 mg/dL — ABNORMAL HIGH (ref 65–99)
Potassium: 4.1 mmol/L (ref 3.5–5.1)
SODIUM: 138 mmol/L (ref 135–145)

## 2015-11-03 NOTE — Progress Notes (Signed)
PROGRESS NOTE  Bridget Ball [id number] DOB: Feb 08, 1928 DOA: 11/01/2015 PCP: [name], MD  Brief Narrative: 82 year old woman admitted for right lower extremity cellulitis.  Assessment/Plan: 1. Right lower extremity cellulitis. Improving as would be expected with IV antibiotics. No evidence of complicating features. No significant right lower extremity edema. This Doppler pending but DVT is doubted. 2. Bilateral lower extremity pretibial ulcers. No evidence of infection. Present on admission. 3. Hyponatremia resolved. 4. Stable normocytic anemia.   Improving. Plan to continue IV antibiotic today. Stop Levaquin. No evidence to suggest gram-negative infection. Likely change to oral antibiotics and discharge on 10/30.  DVT prophylaxis: enoxaparin  Code Status: DNR Family Communication: daughter at bedside Disposition Plan: home  Murray Hodgkins, MD  [facility] Direct contact: [phone] --[place]ia [place]  --[email or web address]; password [email or web address]  7PM-7AM contact night coverage as above 11/03/2015, 1:03 PM  LOS: 0 days   Consultants:    Procedures:    Antimicrobials:  Vancomycin 10/27 >>   Levaquin 10/27 >>   CC: Follow-up cellulitis  Interval history/Subjective: Leg redness is much improved today. Only mild tenderness. Eating very well.  Objective: Vitals:   11/02/15 2005 11/02/15 2100 11/03/15 0615 11/03/15 0945  BP:  (!) 94/56 104/69   Pulse:  86 79   Resp:  18 18   Temp:  98.5 F (36.9 C) 98.6 F (37 C)   TempSrc:  Oral Oral   SpO2: 96% 95% 97% 91%  Weight:      Height:        Intake/Output Summary (Last 24 hours) at 11/03/15 1303 Last data filed at 11/03/15 0900  Gross per 24 hour  Intake              440 ml  Output              450 ml  Net              -10 ml     Filed Weights   11/01/15 2148 11/02/15 0510  Weight: 41.3 kg (91 lb) 41.6 kg (91 lb 12.8 oz)    Exam:    Constitutional:  . Appears calm and comfortable Respiratory:    . CTA bilaterally, no w/r/r.  . Respiratory effort normal. No retractions or accessory muscle use Cardiovascular:  . RRR, no m/r/g . No LE extremity edema   Musculoskeletal:  . Moves right lower extremity without difficulty. Skin:  . Mild tenderness of the right calf to palpation . Very minimal erythema of the lower leg. Ulcer noted without evidence of superinfection. No fluctuance, no abscess noted. No induration.  Ulcer left tibial area also noted Psychiatric:  . Mental status o Mood, affect appropriate  I have personally reviewed following labs and imaging studies:  Basic metabolic panel unremarkable  Urinalysis was grossly positive, no culture obtained. Patient asymptomatic.  Scheduled Meds: . atorvastatin  40 mg Oral q1800  . budesonide  6 mg Oral q morning - 10a  . citalopram  10 mg Oral Daily  . darifenacin  7.5 mg Oral Daily  . enoxaparin (LOVENOX) injection  30 mg Subcutaneous Q24H  . levETIRAcetam  250 mg Oral BID  . levofloxacin (LEVAQUIN) IV  250 mg Intravenous Q24H  . megestrol  80 mg Oral Daily  . mometasone-formoterol  2 puff Inhalation BID  . pantoprazole  40 mg Oral Daily  . silver sulfADIAZINE   Topical BID  . tiotropium  1 capsule Inhalation Daily  . vancomycin  750 mg Intravenous Q24H  Continuous Infusions:   Principal Problem:   Cellulitis Active Problems:   Open wound of leg   COPD (chronic obstructive pulmonary disease) (HCC)   Hypokalemia   Anemia   Hyponatremia   Wound cellulitis   LOS: 0 days

## 2015-11-03 NOTE — Care Management Obs Status (Signed)
[place] NOTIFICATION   Patient Details  Name: Bridget Ball MRN: [id number] Date of Birth: Mar 01, 1928   Medicare Observation Status Notification Given:  Yes  Family signed for patient.  Briant Sites, RN 11/03/2015, 1:48 PM

## 2015-11-04 ENCOUNTER — Ambulatory Visit (HOSPITAL_COMMUNITY): Admission: RE | Admit: 2015-11-04 | Payer: Medicare Other | Source: Ambulatory Visit

## 2015-11-04 DIAGNOSIS — L03115 Cellulitis of right lower limb: Secondary | ICD-10-CM | POA: Diagnosis not present

## 2015-11-04 DIAGNOSIS — S81809A Unspecified open wound, unspecified lower leg, initial encounter: Secondary | ICD-10-CM | POA: Diagnosis not present

## 2015-11-04 LAB — BASIC METABOLIC PANEL
ANION GAP: 7 (ref 5–15)
BUN: 14 mg/dL (ref 6–20)
CO2: 23 mmol/L (ref 22–32)
Calcium: 8.9 mg/dL (ref 8.9–10.3)
Chloride: 108 mmol/L (ref 101–111)
Creatinine, Ser: 0.71 mg/dL (ref 0.44–1.00)
GFR calc Af Amer: >60 mL/min (ref >60)
GLUCOSE: 96 mg/dL (ref 65–99)
POTASSIUM: 3.9 mmol/L (ref 3.5–5.1)
Sodium: 138 mmol/L (ref 135–145)

## 2015-11-04 MED ORDER — DOXYCYCLINE HYCLATE 100 MG PO TABS
100.0000 mg | ORAL_TABLET | Freq: Two times a day (BID) | ORAL | Status: DC
  Administered 2015-11-04: 100 mg via ORAL
  Filled 2015-11-04: qty 1

## 2015-11-04 MED ORDER — LOPERAMIDE HCL 2 MG PO CAPS: 2.0000 mg | ORAL_CAPSULE | ORAL | Status: DC | PRN

## 2015-11-04 MED ORDER — DOXYCYCLINE HYCLATE 100 MG PO TABS: 100.0000 mg | ORAL_TABLET | Freq: Two times a day (BID) | ORAL | 0 refills | Status: DC

## 2015-11-04 MED ORDER — SILVER SULFADIAZINE 1 % EX CREA: TOPICAL_CREAM | Freq: Two times a day (BID) | CUTANEOUS | 0 refills | Status: DC

## 2015-11-04 NOTE — Care Management Note (Signed)
Case Management Note  Patient Details  Name: Bridget Ball MRN: [id number] Date of Birth: 31-Jan-1928  Subjective/Objective:    Patient adm from home with cellulitis. She usually lives alone and she is ind with ADL's. She plans to stay with her daughter for awhile after discharge. She has a PCP, still drives to appointments. She has insurance with prescription drug coverage.                 Action/Plan: Offered Home health RN to patient, she declines.    Expected Discharge Date:        11/04/2015          Expected Discharge Plan:  Home/Self Care  In-House Referral:  NA  Discharge planning Services  CM Consult  Post Acute Care Choice:  Home Health Choice offered to:  Patient  DME Arranged:    DME Agency:     HH Arranged:  Patient Refused [place] Agency:     Status of Service:  Completed, signed off  If discussed at [employer], dates discussed:    Additional Comments:  Colene M[place], Chauncey Reading, RN 11/04/2015, 11:15 AM

## 2015-11-04 NOTE — Progress Notes (Signed)
Discharged PT per MD order and protocol. Discharge handouts reviewed/explained. Education completed.  Pt verbalized understanding and left with all belongings. VSS. IV catheter D/C.  Patient wheeled down by staff member.  

## 2015-11-04 NOTE — Discharge Instructions (Signed)

## 2015-11-04 NOTE — Progress Notes (Signed)
  PROGRESS NOTE  Bridget Ball [id number] DOB: [birth year] DOA: 11/01/2015 PCP: [name], MD  Brief Narrative: 82 year old woman admitted for right lower extremity cellulitis.  Assessment/Plan: 1. Right lower extremity cellulitis. Rapidly resolving. No evidence of complicating features. Right lower extremity venous ultrasound was negative for DVT. 2. Bilateral lower extremity pretibial ulcers. No evidence of infection. Present on admission. 3. Hyponatremia, resolved. 4. Stable normocytic anemia.   Home today on oral doxycycline.  Wound care change dressings BID, apply silver sulfadiazine BID.  HHRN for wound care recommended, patient refused.  Murray Hodgkins, MD  [facility] Direct contact: [phone] --Via amion app [place]  --[email or web address]; password [email or web address]  7PM-7AM contact night coverage as above 11/04/2015, 8:08 AM  LOS: 0 days   Consultants:    Procedures:    Antimicrobials:  Doxycycline 10/30 >> 11/2  Vancomycin 10/27 >> 10/30  Levaquin 10/27 >> 10/29  CC: Follow-up cellulitis  Interval history/Subjective: Feeling better. Still some "stinging" in RLE.  Objective: Vitals:   11/03/15 2015 11/04/15 0543 11/04/15 0756 11/04/15 0758  BP: 107/63 (!) 111/58    Pulse: 92 85    Resp: 18 18    Temp: 98.4 F (36.9 C) 98.3 F (36.8 C)    TempSrc: Oral Oral    SpO2: 97% 97% 96% 97%  Weight:  41.5 kg (91 lb 6.4 oz)    Height:        Intake/Output Summary (Last 24 hours) at 11/04/15 0808 Last data filed at 11/03/15 1809  Gross per 24 hour  Intake              720 ml  Output              450 ml  Net              270 ml     Filed Weights   11/01/15 2148 11/02/15 0510 11/04/15 0543  Weight: 41.3 kg (91 lb) 41.6 kg (91 lb 12.8 oz) 41.5 kg (91 lb 6.4 oz)    Exam:    Constitutional:   Appears calm and comfortable Respiratory:   CTA bilaterally, no w/r/r. Normal resp effort, speaks in full sentences Cardiovascular:   RRR, no r/g, soft 2/6  systolic murmur  No LE edema Skin:   Near resolution of RLE cellulitis, no edema, calf now minimally tender. RLE pretibial wound with some exudate, no evidence of infection. Psychiatric:   Grossly normal mood and affect   I have personally reviewed following labs and imaging studies:  BMP unremarkable.  Venous ultrasound negative for DVT.  Scheduled Meds: . atorvastatin  40 mg Oral q1800  . budesonide  6 mg Oral q morning - 10a  . citalopram  10 mg Oral Daily  . darifenacin  7.5 mg Oral Daily  . enoxaparin (LOVENOX) injection  30 mg Subcutaneous Q24H  . levETIRAcetam  250 mg Oral BID  . megestrol  80 mg Oral Daily  . mometasone-formoterol  2 puff Inhalation BID  . pantoprazole  40 mg Oral Daily  . silver sulfADIAZINE   Topical BID  . tiotropium  1 capsule Inhalation Daily  . vancomycin  750 mg Intravenous Q24H   Continuous Infusions:   Principal Problem:   Cellulitis Active Problems:   Open wound of leg   Anemia   LOS: 0 days

## 2015-11-04 NOTE — Discharge Summary (Addendum)
Physician Discharge Summary  Bridget Ball [id number] DOB: 19-Aug-1928 DOA: 11/01/2015  PCP: [name], MD  Admit date: 11/01/2015 Discharge date: 11/04/2015  Recommendations for Outpatient Follow-up:  1. Follow-up resolution of right lower extremity cellulitis 2. Ongoing wound care for bilateral lower extremity pretibial ulcers. Wound care recommended application of silver sulfadiazine twice a day and dressing changes twice a day. Patient refused home health RN.   Follow-up Information    [name], MD On 11/15/2015.   Specialty:  Internal Medicine Why:  at 10:40 am Contact information: [place] [address] [phone]          Discharge Diagnoses:  1. Right lower extremity cellulitis 2. Bilateral lower extremity pretibial ulcers, present on admission  Discharge Condition: improved Disposition: home  Diet recommendation: regular  Filed Weights   11/01/15 2148 11/02/15 0510 11/04/15 0543  Weight: 41.3 kg (91 lb) 41.6 kg (91 lb 12.8 oz) 41.5 kg (91 lb 6.4 oz)    History of present illness:  82 year old woman admitted for right lower extremity cellulitis.   Hospital Course:  Treated with empiric antibiotics with a rapid clinical improvement. Hospitalization was uncomplicated. Individual issues as below.  1. Right lower extremity cellulitis. Rapidly resolving. No evidence of complicating features. Right lower extremity venous ultrasound was negative for DVT. 2. Bilateral lower extremity pretibial ulcers. No evidence of infection. Present on admission. 3. Hyponatremia, resolved. 4. Stable normocytic anemia.   Home today on oral doxycycline.  Wound care change dressings BID, apply silver sulfadiazine BID.  HHRN for wound care recommended, patient refused.  Antimicrobials:  Doxycycline 10/30 >> 11/2  Vancomycin 10/27 >> 10/30  Levaquin 10/27 >> 10/29  Discharge Instructions  Discharge Instructions    Diet general    Complete  by:  As directed    Discharge instructions    Complete by:  As directed    Call your physician or seek immediate medical attention for pain, increased redness, swelling, drainage or worsening of condition.   Discharge wound care:    Complete by:  As directed    Apply silver sulfadiazine to wounds twice daily and keep covered with nonstick gauze.   Increase activity slowly    Complete by:  As directed        Medication List    TAKE these medications   albuterol 108 (90 Base) MCG/ACT inhaler Commonly known as:  PROVENTIL HFA;VENTOLIN HFA Inhale 2 puffs into the lungs every 6 (six) hours as needed for wheezing or shortness of breath.   atorvastatin 80 MG tablet Commonly known as:  LIPITOR Take 1 tablet (80 mg total) by mouth daily at 6 PM. What changed:  how much to take   budesonide 3 MG 24 hr capsule Commonly known as:  ENTOCORT EC Take 6 mg by mouth every morning.   budesonide-formoterol 160-4.5 MCG/ACT inhaler Commonly known as:  SYMBICORT Inhale 2 puffs into the lungs 2 (two) times daily.   CEREFOLIN NAC 6-90.314-2-600 MG Tabs Take 1 tablet by mouth daily.   citalopram 10 MG tablet Commonly known as:  CELEXA TAKE 1 TABLET (10 MG TOTAL) BY MOUTH DAILY.   doxycycline 100 MG tablet Commonly known as:  VIBRA-TABS Take 1 tablet (100 mg total) by mouth every 12 (twelve) hours.   levETIRAcetam 250 MG tablet Commonly known as:  KEPPRA Take 1 tablet (250 mg total) by mouth 2 (two) times daily.   loperamide 2 MG capsule Commonly known as:  IMODIUM Take 1 capsule (2 mg total)  by mouth as needed for diarrhea or loose stools.   megestrol 40 MG tablet Commonly known as:  MEGACE Take 80 mg by mouth daily.   memantine 5 MG tablet Commonly known as:  NAMENDA SIG:  Take one tablet daily for one week  then: take one tablet in AM and one tablet in PM for one week.  then: take one tablet in AM and two tablets in PM for one week.  then: take two tablets in AM and two tablets in  PM.   pantoprazole 40 MG tablet Commonly known as:  PROTONIX TAKE ONE TABLET ONCE DAILY WITH BREAKFAST   silver sulfADIAZINE 1 % cream Commonly known as:  SILVADENE Apply topically 2 (two) times daily.   solifenacin 5 MG tablet Commonly known as:  VESICARE Take 5 mg by mouth daily.   SPIRIVA HANDIHALER 18 MCG inhalation capsule Generic drug:  tiotropium Place 1 capsule into inhaler and inhale daily.      Allergies  Allergen Reactions  . Penicillins Anaphylaxis        The results of significant diagnostics from this hospitalization (including imaging, microbiology, ancillary and laboratory) are listed below for reference.    Significant Diagnostic Studies: Dg Tibia/fibula Right  Result Date: 11/01/2015 CLINICAL DATA:  Chronic wound from a fall.  Swelling and pain EXAM: RIGHT TIBIA AND FIBULA - 2 VIEW COMPARISON:  None. FINDINGS: No acute fracture, malalignment, periostitis, or bone destruction. Moderate diffuse soft tissue swelling. Nonspecific soft tissue calcifications posterior calf. Vascular calcifications. Probable phleboliths distal posterior soft tissues. Bones appear osteopenic. Moderate narrowing and spurring of the medial joint space compartment. Mild narrowing and spurring of the patellofemoral and lateral compartments. Possible faint joint space calcification suggests chondrocalcinosis. IMPRESSION: 1. No fracture or suspicious bone lesion identified. 2. Moderate degenerative changes 3. Suspect chondrocalcinosis. 4. Vascular calcification Electronically Signed   By: Donavan Foil M.D.   On: 11/01/2015 19:20   US Venous Img Lower Unilateral Right  Result Date: 11/03/2015 CLINICAL DATA:  Right lower extremity swelling with cellulitis EXAM: RIGHT LOWER EXTREMITY VENOUS DUPLEX ULTRASOUND TECHNIQUE: Doppler venous assessment of the right lower extremity deep venous system was performed, including characterization of spectral flow, compressibility, and phasicity. COMPARISON:   None. FINDINGS: There is complete compressibility of the right common femoral, femoral, and popliteal veins. Doppler analysis demonstrates respiratory phasicity and augmentation of flow with calf compression. No obvious superficial vein or calf vein thrombosis. IMPRESSION: No evidence of right lower extremity DVT. Electronically Signed   By: Marybelle Killings M.D.   On: 11/03/2015 13:06    Labs: Basic Metabolic Panel:  Recent Labs Lab 11/01/15 1811 11/02/15 0434 11/03/15 0522 11/04/15 0556  NA 134* 140 138 138  K 3.0* 3.7 4.1 3.9  CL 104 112* 111 108  CO2 23 22 22 23   GLUCOSE 114* 89 103* 96  BUN 17 12 10 14   CREATININE 1.10* 0.80 0.76 0.71  CALCIUM 9.0 8.8* 8.9 8.9   Liver Function Tests:  Recent Labs Lab 11/02/15 0434  AST 14*  ALT 15  ALKPHOS 35*  BILITOT 0.9  PROT 5.7*  ALBUMIN 3.2*   CBC:  Recent Labs Lab 11/01/15 1811 11/02/15 0434  WBC 13.4* 10.1  NEUTROABS 11.2*  --   HGB 11.8* 10.5*  HCT 35.3* 31.7*  MCV 96.7 97.8  PLT 215 196    Principal Problem:   Cellulitis Active Problems:   Open wound of leg   Anemia   Time coordinating discharge: 25 minutes  Signed:  Murray Hodgkins, MD [facility] 11/04/2015, 11:52 AM

## 2015-11-06 ENCOUNTER — Ambulatory Visit (HOSPITAL_COMMUNITY)
Admit: 2015-11-06 | Discharge: 2015-11-06 | Disposition: A | Discharge status: Home / Self Care | Payer: Medicare Other | Attending: Urology | Admitting: Urology

## 2015-11-06 DIAGNOSIS — N1330 Unspecified hydronephrosis: Secondary | ICD-10-CM | POA: Diagnosis not present

## 2015-11-08 ENCOUNTER — Emergency Department (HOSPITAL_COMMUNITY): Payer: Medicare Other

## 2015-11-08 ENCOUNTER — Emergency Department (HOSPITAL_COMMUNITY)
Admission: EM | Admit: 2015-11-08 | Discharge: 2015-11-08 | Disposition: A | Discharge status: Home / Self Care | Payer: Medicare Other | Attending: Emergency Medicine | Admitting: Emergency Medicine

## 2015-11-08 ENCOUNTER — Encounter (HOSPITAL_COMMUNITY): Payer: Self-pay | Admitting: Emergency Medicine

## 2015-11-08 DIAGNOSIS — R531 Weakness: Secondary | ICD-10-CM | POA: Insufficient documentation

## 2015-11-08 DIAGNOSIS — R5383 Other fatigue: Secondary | ICD-10-CM | POA: Insufficient documentation

## 2015-11-08 DIAGNOSIS — J449 Chronic obstructive pulmonary disease, unspecified: Secondary | ICD-10-CM | POA: Diagnosis not present

## 2015-11-08 DIAGNOSIS — Z85828 Personal history of other malignant neoplasm of skin: Secondary | ICD-10-CM | POA: Insufficient documentation

## 2015-11-08 DIAGNOSIS — R0602 Shortness of breath: Secondary | ICD-10-CM | POA: Insufficient documentation

## 2015-11-08 DIAGNOSIS — I252 Old myocardial infarction: Secondary | ICD-10-CM | POA: Diagnosis not present

## 2015-11-08 DIAGNOSIS — Z87891 Personal history of nicotine dependence: Secondary | ICD-10-CM | POA: Insufficient documentation

## 2015-11-08 DIAGNOSIS — Z79899 Other long term (current) drug therapy: Secondary | ICD-10-CM | POA: Insufficient documentation

## 2015-11-08 LAB — CBC WITH DIFFERENTIAL/PLATELET
BASOS ABS: 0 10*3/uL (ref 0.0–0.1)
BASOS PCT: 0 %
EOS PCT: 2 %
Eosinophils Absolute: 0.2 10*3/uL (ref 0.0–0.7)
HCT: 41.6 % (ref 36.0–46.0)
Hemoglobin: 13.5 g/dL (ref 12.0–15.0)
LYMPHS PCT: 12 %
Lymphs Abs: 1.2 10*3/uL (ref 0.7–4.0)
MCH: 32.2 pg (ref 26.0–34.0)
MCHC: 32.5 g/dL (ref 30.0–36.0)
MCV: 99.3 fL (ref 78.0–100.0)
MONO ABS: 0.5 10*3/uL (ref 0.1–1.0)
MONOS PCT: 5 %
Neutro Abs: 8.4 10*3/uL — ABNORMAL HIGH (ref 1.7–7.7)
Neutrophils Relative %: 81 %
PLATELETS: 325 10*3/uL (ref 150–400)
RBC: 4.19 MIL/uL (ref 3.87–5.11)
RDW: 15.3 % (ref 11.5–15.5)
WBC: 10.3 10*3/uL (ref 4.0–10.5)

## 2015-11-08 LAB — URINE MICROSCOPIC-ADD ON: RBC / HPF: NONE SEEN RBC/hpf (ref 0–5)

## 2015-11-08 LAB — URINALYSIS, ROUTINE W REFLEX MICROSCOPIC
Bilirubin Urine: NEGATIVE
Glucose, UA: NEGATIVE mg/dL
HGB URINE DIPSTICK: NEGATIVE
Ketones, ur: NEGATIVE mg/dL
NITRITE: NEGATIVE
PROTEIN: NEGATIVE mg/dL
SPECIFIC GRAVITY, URINE: 1.015 (ref 1.005–1.030)
pH: 6 (ref 5.0–8.0)

## 2015-11-08 LAB — COMPREHENSIVE METABOLIC PANEL
ALT: 21 U/L (ref 14–54)
ANION GAP: 5 (ref 5–15)
AST: 18 U/L (ref 15–41)
Albumin: 4.1 g/dL (ref 3.5–5.0)
Alkaline Phosphatase: 50 U/L (ref 38–126)
BILIRUBIN TOTAL: 0.4 mg/dL (ref 0.3–1.2)
BUN: 18 mg/dL (ref 6–20)
CHLORIDE: 104 mmol/L (ref 101–111)
CO2: 26 mmol/L (ref 22–32)
Calcium: 9.3 mg/dL (ref 8.9–10.3)
Creatinine, Ser: 0.94 mg/dL (ref 0.44–1.00)
GFR calc Af Amer: >60 mL/min (ref >60)
GFR, EST NON AFRICAN AMERICAN: 54 mL/min — AB (ref >60)
Glucose, Bld: 80 mg/dL (ref 65–99)
POTASSIUM: 3.6 mmol/L (ref 3.5–5.1)
Sodium: 135 mmol/L (ref 135–145)
TOTAL PROTEIN: 7.6 g/dL (ref 6.5–8.1)

## 2015-11-08 LAB — BRAIN NATRIURETIC PEPTIDE: B NATRIURETIC PEPTIDE 5: 24 pg/mL (ref 0.0–100.0)

## 2015-11-08 LAB — I-STAT TROPONIN, ED: TROPONIN I, POC: 0 ng/mL (ref 0.00–0.08)

## 2015-11-08 MED ORDER — IPRATROPIUM-ALBUTEROL 0.5-2.5 (3) MG/3ML IN SOLN
3.0000 mL | Freq: Once | RESPIRATORY_TRACT | Status: AC
  Administered 2015-11-08: 3 mL via RESPIRATORY_TRACT
  Filled 2015-11-08: qty 3

## 2015-11-08 NOTE — ED Notes (Signed)
Patient walked to the nurses station with 97% on room air. Patient denies short of breath and dizziness.

## 2015-11-08 NOTE — ED Triage Notes (Signed)
Pt was discharge earlier this week.  Pt has been extremely weak, family doctor advised them to come back to check on cellulitis .

## 2015-11-08 NOTE — Discharge Instructions (Signed)
Your blood work and work-up today is very reassuring.   Please follow up closely with her primary care doctor. Return without fail for worsening symptoms, including fever, confusion, difficulty walking, difficulty breathing, or any other symptoms concerning to you

## 2015-11-08 NOTE — ED Provider Notes (Signed)
[facility] DEPT Provider Note   CSN: [id number] Arrival date & time: 11/08/15  1257  By signing my name below, I, [name], attest that this documentation has been prepared under the direction and in the presence of Forde Dandy, MD . Electronically Signed: [name], Scribe. 11/08/2015. 1:18 PM.  History   Chief Complaint Chief Complaint  Patient presents with  . Weakness   The history is provided by the patient. No language interpreter was used.   HPI Comments: Bridget Ball is a 82 y.o. female with PMHx of COPD, who presents to the Emergency Department complaining of gradually worsening, weakness and fatigue that began ~1 week ago. Pt reports she visited the ED on 10/27 for a wound on her right leg and was admitted to the hospital for cellulitis. She notes she was discharged on 10/30 and finished her doxycycline yesterday. Per daughter, pt's symptoms of weakness began while she was in the hospital and have persisted since then. Has not worsened, but she notes pt can also have episodes of shortness of breath in which she appears winded walking around the house. She states she called her PCP this morning and he advised them to visit the ED. She denies shortness of breath in the ED, chest pain, fever, abdominal pain, vomiting, diarrhea, dysuria, urinary frequency, cough, congestion and rhinorrhea.   Past Medical History:  Diagnosis Date  . Anxiety   . Arthritis   . Attention to nephrostomy ([place])    PT HAS NEPHROSTOMY TUBE IN PLACE  . Bruises easily   . Cancer [facility])    Skin cancer  . Chronic diarrhea   . Colitis   . COPD (chronic obstructive pulmonary disease) ([place])   . Dementia   . Depression   . GERD (gastroesophageal reflux disease)   . Hiatal hernia   . Hx of bronchitis   . Hx of pulmonary edema JULY 2015  . Hx of pulmonary edema JULY 2015  . Hx of septic shock JULY 2015  . Hydronephrosis, left   . Hyperlipidemia   . Memory difficulties   . Microscopic colitis   .  Myocardial infarction    unknown time   . Seizures ([place])    last seizure was 4-5 years ago with "brain bleed"  . Shortness of breath    with exertion  . Stroke [facility])    TIA four years ago / stroke NOV 2014    Patient Active Problem List   Diagnosis Date Noted  . Cellulitis 11/01/2015  . Anemia 11/01/2015  . Wound cellulitis 11/01/2015  . Bronchospasm 06/12/2014  . Acute respiratory failure with hypoxia ([place]) 06/12/2014  . Allergic reaction   . Esophageal reflux 08/17/2013  . COPD (chronic obstructive pulmonary disease) ([place]) 08/11/2013  . Acute pyelonephritis 08/03/2013  . Septic shock due to Enterococcus species ([place]) 08/03/2013  . Septicemia ([place]) 08/03/2013  . Gram negative septic shock ([place]) 08/03/2013  . NSTEMI (non-ST elevated myocardial infarction) type 2 07/26/2013  . Dehydration 07/25/2013  . AKI (acute kidney injury) ([place]) 07/25/2013  . Elevated troponin 07/25/2013  . Colitis 07/08/2013  . Nausea and vomiting 07/08/2013  . Abdominal pain, unspecified site 07/08/2013  . Hydronephrosis, left 07/08/2013  . Microscopic colitis 06/27/2013  . Memory loss 04/25/2013  . Depression 04/25/2013  . Loss of weight 04/20/2013  . Melena 04/20/2013  . Diarrhea 04/20/2013  . SAH (subarachnoid hemorrhage) ([place]) 11/22/2012  . Seizures secondary to [facility] 11/22/2012  . Cellulitis of leg, right 11/13/2012  . Open  wound of leg 11/13/2012  . Oral thrush 11/13/2012    Past Surgical History:  Procedure Laterality Date  . APPENDECTOMY    . BREAST BIOPSY Right 02/02/2014   Procedure: RIGHT BREAST BIOPSY;  Surgeon: Jamesetta So, MD;  Location: [facility] ORS;  Service: General;  Laterality: Right;  . COLONOSCOPY N/A 05/24/2013   Procedure: COLONOSCOPY;  Surgeon: Rogene Houston, MD;  Location: [facility] ENDO SUITE;  Service: Endoscopy;  Laterality: N/A;  100  . CYSTOSCOPY W/ URETERAL STENT PLACEMENT Left 08/06/2015   Procedure: CYSTOSCOPY WITH LEFT URETERAL STENT EXCHANGE - Sammie Bench;  Surgeon: Franchot Gallo, MD;  Location: [facility] ORS;  Service: Urology;  Laterality: Left;  . CYSTOSCOPY WITH RETROGRADE PYELOGRAM, URETEROSCOPY AND STENT PLACEMENT Left 09/18/2013   Procedure: CYSTOSCOPY WITH RETROGRADE PYELOGRAM,  URETEROSCOPY AND  STENT PLACEMENT, removal of nephrostomy tube;  Surgeon: Jorja Loa, MD;  Location: [facility] ORS;  Service: Urology;  Laterality: Left;  . CYSTOSCOPY WITH STENT PLACEMENT Left 09/04/2014   Procedure: CYSTOSCOPY WITH LEFT J2 STENT EXCHANGE;  Surgeon: Franchot Gallo, MD;  Location: [facility] ORS;  Service: Urology;  Laterality: Left;  . ESOPHAGOGASTRODUODENOSCOPY N/A 05/24/2013   Procedure: ESOPHAGOGASTRODUODENOSCOPY (EGD);  Surgeon: Rogene Houston, MD;  Location: [facility] ENDO SUITE;  Service: Endoscopy;  Laterality: N/A;  . KNEE ARTHROSCOPY Right   . SKIN CANCER EXCISION    . TONSILLECTOMY    . TUBAL LIGATION      OB History    No data available     Home Medications    Prior to Admission medications   Medication Sig Start Date End Date Taking? Authorizing Provider  albuterol (PROVENTIL HFA;VENTOLIN HFA) 108 (90 BASE) MCG/ACT inhaler Inhale 2 puffs into the lungs every 6 (six) hours as needed for wheezing or shortness of breath. 08/03/13   Janece Canterbury, MD  atorvastatin (LIPITOR) 80 MG tablet Take 1 tablet (80 mg total) by mouth daily at 6 PM. Patient taking differently: Take 40 mg by mouth daily at 6 PM.  08/03/13   Janece Canterbury, MD  budesonide (ENTOCORT EC) 3 MG 24 hr capsule Take 6 mg by mouth every morning.  03/20/15   Historical Provider, MD  budesonide-formoterol (SYMBICORT) 160-4.5 MCG/ACT inhaler Inhale 2 puffs into the lungs 2 (two) times daily. 06/13/14   Samuella Cota, MD  citalopram (CELEXA) 10 MG tablet TAKE 1 TABLET (10 MG TOTAL) BY MOUTH DAILY. 06/11/15   Garvin Fila, MD  doxycycline (VIBRA-TABS) 100 MG tablet Take 1 tablet (100 mg total) by mouth every 12 (twelve) hours. 11/04/15   Samuella Cota, MD  levETIRAcetam (KEPPRA) 250 MG tablet Take 1  tablet (250 mg total) by mouth 2 (two) times daily. 03/20/15   Ward Givens, NP  loperamide (IMODIUM) 2 MG capsule Take 1 capsule (2 mg total) by mouth as needed for diarrhea or loose stools. 11/04/15   Samuella Cota, MD  megestrol (MEGACE) 40 MG tablet Take 80 mg by mouth daily. 10/15/15   Historical Provider, MD  memantine (NAMENDA) 5 MG tablet SIG:  Take one tablet daily for one week  then: take one tablet in AM and one tablet in PM for one week.  then: take one tablet in AM and two tablets in PM for one week.  then: take two tablets in AM and two tablets in PM. Patient not taking: Reported on 10/01/2015 09/26/15   Ward Givens, NP  Methylfol-Algae-B12-Acetylcyst (CEREFOLIN NAC) 6-90.314-2-600 MG TABS Take 1 tablet by mouth daily. 10/18/14  Garvin Fila, MD  pantoprazole (PROTONIX) 40 MG tablet TAKE ONE TABLET ONCE DAILY WITH BREAKFAST 10/23/15   Butch Penny, NP  silver sulfADIAZINE (SILVADENE) 1 % cream Apply topically 2 (two) times daily. 11/04/15   Samuella Cota, MD  solifenacin (VESICARE) 5 MG tablet Take 5 mg by mouth daily.    Historical Provider, MD  SPIRIVA HANDIHALER 18 MCG inhalation capsule Place 1 capsule into inhaler and inhale daily. 05/20/14   Historical Provider, MD    Family History Family History  Problem Relation Age of Onset  . Diabetes Mother   . Stroke Mother   . Heart attack Father     Social History Social History  Substance Use Topics  . Smoking status: Former Smoker    Packs/day: 1.00    Years: 50.00    Types: Cigarettes    Quit date: 11/13/1997  . Smokeless tobacco: Never Used  . Alcohol use 4.2 oz/week    7 Glasses of wine per week     Comment: 2 glasses a night wine (none in a year)     Allergies   Penicillins   Review of Systems Review of Systems  Constitutional: Positive for fatigue. Negative for fever.  HENT: Negative for congestion and rhinorrhea.   Respiratory: Negative for cough and shortness of breath.     Cardiovascular: Negative for chest pain.  Gastrointestinal: Negative for abdominal pain, diarrhea and vomiting.  Genitourinary: Negative for dysuria and frequency.  Skin: Positive for wound.  Neurological: Positive for weakness.  All other systems reviewed and are negative.  Physical Exam Updated Vital Signs BP 119/71 (BP Location: Left Arm)   Pulse 95   Temp 97.1 F (36.2 C) (Oral)   Resp 18   Ht 5\' 1"  (1.549 m)   Wt 91 lb (41.3 kg)   SpO2 98%   BMI 17.19 kg/m   Physical Exam Physical Exam  Nursing note and vitals reviewed. Constitutional: Well developed, thin and frail appearing, non-toxic, and in no acute distress Head: Normocephalic and atraumatic.  Mouth/Throat: Oropharynx is clear and moist.  Neck: Normal range of motion. Neck supple.  Cardiovascular: Normal rate and regular rhythm.   Pulmonary/Chest: Effort normal and breath sounds normal.  Abdominal: Soft. There is no tenderness. There is no rebound and no guarding.  Musculoskeletal: Normal range of motion. there are 2 non-healing ulcers over the right lower anterior leg without significant surrounding erythema, warmth or induration. Neurological: Alert, no facial droop, fluent speech, moves all extremities symmetrically Skin: Skin is warm and dry.  Psychiatric: Cooperative  ED Treatments / Results  Labs (all labs ordered are listed, but only abnormal results are displayed) Labs Reviewed - No data to display  EKG  EKG Interpretation None       Radiology US Renal  Result Date: 11/06/2015 CLINICAL DATA:  Hydronephrosis EXAM: RENAL / URINARY TRACT ULTRASOUND COMPLETE COMPARISON:  6/20/ 17 FINDINGS: Right Kidney: Length: 10 cm. There is normal echogenicity. There is mild dilatation of right renal pelvis and proximal right ureter. No frank hydronephrosis is noted. No renal calculi. Left Kidney: Length: 9.3 cm. Mild left hydronephrosis. Moderate dilatation of left renal pelvis up to 8.2 mm. Bladder: Appears  normal for degree of bladder distention. Partially visualized ureteral stent. Prevoid bladder measures 118 cc. Postvoid the bladder measures 55 cc. IMPRESSION: 1. Mild dilatation of right renal pelvis without frank hydronephrosis. 2. There is mild left hydronephrosis. Moderate dilatation of left renal pelvis. 3. Partially visualized ureteral stent within bladder. Electronically  Signed   By: Lahoma Crocker M.D.   On: 11/06/2015 17:05    Procedures Procedures (including critical care time)  Medications Ordered in ED Medications - No data to display  DIAGNOSTIC STUDIES:  Oxygen Saturation is 98% on RA, normal by my interpretation.    COORDINATION OF CARE:  1:15 PM Discussed treatment plan with pt at bedside and pt agreed to plan.  Initial Impression / Assessment and Plan / ED Course  I have reviewed the triage vital signs and the nursing notes.  Pertinent labs & imaging results that were available during my care of the patient were reviewed by me and considered in my medical decision making (see chart for details).  Clinical Course    82 year old female, recently discharged from hospital after treatment for right lower extremity cellulitis, who presents with persistent weakness. She is nontoxic in no acute distress. Vital signs are within normal limits. Her exam is overall nonfocal. She is chronic ulcers in the right lower extremity without evidence of acute cellulitis now. Her basic blood work is reassuring. There is no leukocytosis. She has no major electrolyte or metabolic derangements. EKG and troponin are unremarkable and I do not think that this is a presentation for heart failure ACS. Her urine is not suggestive of infection but sent for culture. Her chest x-ray is clear and does not show any acute cardiopulmonary processes. The patient does tolerate by mouth intake here, is able to ambulate here in the emergency department without fatigue or hypoxia. This time I do not feel that she  requires inpatient hospitalization. I feel that she is stable for discharge home to live with her daughter. Discussed close outpatient follow-up with the primary care doctor. Strict return and follow-up instructions are reviewed with patient and her daughter. She expressed understanding of all discharge instructions, and felt comfortable with the plan of care.  I personally performed the services described in this documentation, which was scribed in my presence. The recorded information has been reviewed and is accurate.   Final Clinical Impressions(s) / ED Diagnoses   Final diagnoses:  None    New Prescriptions New Prescriptions   No medications on file     Forde Dandy, MD 11/08/15 1819

## 2015-11-10 LAB — URINE CULTURE: Culture: NO GROWTH

## 2015-11-12 ENCOUNTER — Ambulatory Visit (INDEPENDENT_AMBULATORY_CARE_PROVIDER_SITE_OTHER): Payer: Medicare Other | Admitting: Urology

## 2015-11-12 ENCOUNTER — Other Ambulatory Visit (HOSPITAL_COMMUNITY)
Admission: RE | Admit: 2015-11-12 | Discharge: 2015-11-12 | Disposition: A | Discharge status: Home / Self Care | Payer: Medicare Other | Source: Ambulatory Visit | Attending: Internal Medicine | Admitting: Internal Medicine

## 2015-11-12 DIAGNOSIS — N131 Hydronephrosis with ureteral stricture, not elsewhere classified: Secondary | ICD-10-CM

## 2015-11-12 DIAGNOSIS — N312 Flaccid neuropathic bladder, not elsewhere classified: Secondary | ICD-10-CM | POA: Diagnosis not present

## 2015-11-13 LAB — URINE CULTURE

## 2015-11-19 ENCOUNTER — Telehealth: Payer: Self-pay | Admitting: Adult Health

## 2015-11-19 NOTE — Telephone Encounter (Signed)
Spoke with daughter, Ferd Hibbs her request.  Pt had not started the namenda titration yet.  Pharmacy told her that prescription was cancelled.  I will call in again to [facility].

## 2015-11-19 NOTE — Telephone Encounter (Signed)
Daughter [name] called regarding memantine (NAMENDA) 5 MG tablet, pharmacy advised E-script was cancelled, requests to re-send to [facility].

## 2015-11-20 MED ORDER — MEMANTINE HCL 5 MG PO TABS: ORAL_TABLET | ORAL | 0 refills | Status: DC

## 2015-11-20 NOTE — Telephone Encounter (Signed)
Rn call patients daughter Bridget Ball about [name] prescription. Rn stated the prescription was fax to [place] twice this am. Rn also stated there are titration directions for the Namenda. Rn requested that the pharmacy print out the directions for the titration. PT's daughter verbalized understanding.

## 2015-11-20 NOTE — Telephone Encounter (Signed)
Prescription printed and given to [facility]

## 2015-11-24 ENCOUNTER — Inpatient Hospital Stay (HOSPITAL_COMMUNITY): Payer: Medicare Other

## 2015-11-24 ENCOUNTER — Inpatient Hospital Stay (HOSPITAL_COMMUNITY)
Admission: EM | Admit: 2015-11-24 | Discharge: 2015-11-28 | DRG: 602 | Disposition: A | Discharge status: Home / Self Care | Payer: Medicare Other | Attending: Internal Medicine | Admitting: Internal Medicine

## 2015-11-24 ENCOUNTER — Encounter (HOSPITAL_COMMUNITY): Payer: Self-pay | Admitting: Emergency Medicine

## 2015-11-24 DIAGNOSIS — F0390 Unspecified dementia without behavioral disturbance: Secondary | ICD-10-CM | POA: Diagnosis present

## 2015-11-24 DIAGNOSIS — L97819 Non-pressure chronic ulcer of other part of right lower leg with unspecified severity: Secondary | ICD-10-CM | POA: Diagnosis not present

## 2015-11-24 DIAGNOSIS — K219 Gastro-esophageal reflux disease without esophagitis: Secondary | ICD-10-CM | POA: Diagnosis not present

## 2015-11-24 DIAGNOSIS — E860 Dehydration: Secondary | ICD-10-CM

## 2015-11-24 DIAGNOSIS — Z681 Body mass index (BMI) 19 or less, adult: Secondary | ICD-10-CM | POA: Diagnosis not present

## 2015-11-24 DIAGNOSIS — R413 Other amnesia: Secondary | ICD-10-CM | POA: Diagnosis not present

## 2015-11-24 DIAGNOSIS — L539 Erythematous condition, unspecified: Secondary | ICD-10-CM | POA: Diagnosis present

## 2015-11-24 DIAGNOSIS — R569 Unspecified convulsions: Secondary | ICD-10-CM | POA: Diagnosis not present

## 2015-11-24 DIAGNOSIS — Z880 Allergy status to penicillin: Secondary | ICD-10-CM

## 2015-11-24 DIAGNOSIS — J449 Chronic obstructive pulmonary disease, unspecified: Secondary | ICD-10-CM | POA: Diagnosis present

## 2015-11-24 DIAGNOSIS — I10 Essential (primary) hypertension: Secondary | ICD-10-CM | POA: Diagnosis present

## 2015-11-24 DIAGNOSIS — R2689 Other abnormalities of gait and mobility: Secondary | ICD-10-CM

## 2015-11-24 DIAGNOSIS — K52839 Microscopic colitis, unspecified: Secondary | ICD-10-CM | POA: Diagnosis present

## 2015-11-24 DIAGNOSIS — Z8249 Family history of ischemic heart disease and other diseases of the circulatory system: Secondary | ICD-10-CM | POA: Diagnosis not present

## 2015-11-24 DIAGNOSIS — Z936 Other artificial openings of urinary tract status: Secondary | ICD-10-CM

## 2015-11-24 DIAGNOSIS — L03114 Cellulitis of left upper limb: Secondary | ICD-10-CM | POA: Diagnosis not present

## 2015-11-24 DIAGNOSIS — I2699 Other pulmonary embolism without acute cor pulmonale: Secondary | ICD-10-CM

## 2015-11-24 DIAGNOSIS — Z87891 Personal history of nicotine dependence: Secondary | ICD-10-CM

## 2015-11-24 DIAGNOSIS — L03115 Cellulitis of right lower limb: Principal | ICD-10-CM

## 2015-11-24 DIAGNOSIS — Z833 Family history of diabetes mellitus: Secondary | ICD-10-CM

## 2015-11-24 DIAGNOSIS — L97829 Non-pressure chronic ulcer of other part of left lower leg with unspecified severity: Secondary | ICD-10-CM | POA: Diagnosis not present

## 2015-11-24 DIAGNOSIS — L0390 Cellulitis, unspecified: Secondary | ICD-10-CM

## 2015-11-24 DIAGNOSIS — Z823 Family history of stroke: Secondary | ICD-10-CM | POA: Diagnosis not present

## 2015-11-24 DIAGNOSIS — Z8673 Personal history of transient ischemic attack (TIA), and cerebral infarction without residual deficits: Secondary | ICD-10-CM | POA: Diagnosis not present

## 2015-11-24 DIAGNOSIS — E43 Unspecified severe protein-calorie malnutrition: Secondary | ICD-10-CM | POA: Diagnosis not present

## 2015-11-24 DIAGNOSIS — Z66 Do not resuscitate: Secondary | ICD-10-CM | POA: Diagnosis not present

## 2015-11-24 DIAGNOSIS — L97519 Non-pressure chronic ulcer of other part of right foot with unspecified severity: Secondary | ICD-10-CM

## 2015-11-24 DIAGNOSIS — J441 Chronic obstructive pulmonary disease with (acute) exacerbation: Secondary | ICD-10-CM | POA: Diagnosis present

## 2015-11-24 DIAGNOSIS — Z7951 Long term (current) use of inhaled steroids: Secondary | ICD-10-CM

## 2015-11-24 DIAGNOSIS — I214 Non-ST elevation (NSTEMI) myocardial infarction: Secondary | ICD-10-CM | POA: Diagnosis present

## 2015-11-24 DIAGNOSIS — Z85828 Personal history of other malignant neoplasm of skin: Secondary | ICD-10-CM

## 2015-11-24 DIAGNOSIS — E785 Hyperlipidemia, unspecified: Secondary | ICD-10-CM | POA: Diagnosis present

## 2015-11-24 DIAGNOSIS — L97529 Non-pressure chronic ulcer of other part of left foot with unspecified severity: Secondary | ICD-10-CM

## 2015-11-24 DIAGNOSIS — I252 Old myocardial infarction: Secondary | ICD-10-CM | POA: Diagnosis not present

## 2015-11-24 DIAGNOSIS — E876 Hypokalemia: Secondary | ICD-10-CM | POA: Diagnosis present

## 2015-11-24 DIAGNOSIS — J42 Unspecified chronic bronchitis: Secondary | ICD-10-CM | POA: Diagnosis not present

## 2015-11-24 LAB — BASIC METABOLIC PANEL
Anion gap: 6 (ref 5–15)
BUN: 14 mg/dL (ref 6–20)
CALCIUM: 9.2 mg/dL (ref 8.9–10.3)
CO2: 24 mmol/L (ref 22–32)
CREATININE: 0.96 mg/dL (ref 0.44–1.00)
Chloride: 107 mmol/L (ref 101–111)
GFR calc Af Amer: >60 mL/min (ref >60)
GFR, EST NON AFRICAN AMERICAN: 52 mL/min — AB (ref >60)
Glucose, Bld: 116 mg/dL — ABNORMAL HIGH (ref 65–99)
POTASSIUM: 3.4 mmol/L — AB (ref 3.5–5.1)
SODIUM: 137 mmol/L (ref 135–145)

## 2015-11-24 LAB — CBC WITH DIFFERENTIAL/PLATELET
Basophils Absolute: 0 10*3/uL (ref 0.0–0.1)
Basophils Relative: 0 %
EOS ABS: 0.1 10*3/uL (ref 0.0–0.7)
EOS PCT: 1 %
HCT: 37.8 % (ref 36.0–46.0)
Hemoglobin: 12.5 g/dL (ref 12.0–15.0)
LYMPHS ABS: 0.6 10*3/uL — AB (ref 0.7–4.0)
Lymphocytes Relative: 5 %
MCH: 32.2 pg (ref 26.0–34.0)
MCHC: 33.1 g/dL (ref 30.0–36.0)
MCV: 97.4 fL (ref 78.0–100.0)
MONO ABS: 0.5 10*3/uL (ref 0.1–1.0)
Monocytes Relative: 4 %
Neutro Abs: 11.7 10*3/uL — ABNORMAL HIGH (ref 1.7–7.7)
Neutrophils Relative %: 90 %
PLATELETS: 236 10*3/uL (ref 150–400)
RBC: 3.88 MIL/uL (ref 3.87–5.11)
RDW: 15.5 % (ref 11.5–15.5)
WBC: 12.9 10*3/uL — AB (ref 4.0–10.5)

## 2015-11-24 LAB — PROTIME-INR
INR: 0.92
PROTHROMBIN TIME: 12.3 s (ref 11.4–15.2)

## 2015-11-24 LAB — TSH: TSH: 1.094 u[IU]/mL (ref 0.350–4.500)

## 2015-11-24 MED ORDER — POLYVINYL ALCOHOL 1.4 % OP SOLN: 1.0000 [drp] | OPHTHALMIC | Status: DC | PRN

## 2015-11-24 MED ORDER — FENTANYL CITRATE (PF) 100 MCG/2ML IJ SOLN
50.0000 ug | Freq: Once | INTRAMUSCULAR | Status: AC
  Administered 2015-11-24: 50 ug via INTRAVENOUS
  Filled 2015-11-24: qty 2

## 2015-11-24 MED ORDER — SODIUM CHLORIDE 0.9 % IV BOLUS (SEPSIS)
1000.0000 mL | Freq: Once | INTRAVENOUS | Status: AC
  Administered 2015-11-24: 1000 mL via INTRAVENOUS

## 2015-11-24 MED ORDER — ALBUTEROL SULFATE (2.5 MG/3ML) 0.083% IN NEBU
2.5000 mg | INHALATION_SOLUTION | Freq: Four times a day (QID) | RESPIRATORY_TRACT | Status: DC | PRN
  Administered 2015-11-24: 2.5 mg via RESPIRATORY_TRACT
  Filled 2015-11-24: qty 3

## 2015-11-24 MED ORDER — VANCOMYCIN HCL IN DEXTROSE 1-5 GM/200ML-% IV SOLN
1000.0000 mg | Freq: Once | INTRAVENOUS | Status: AC
  Administered 2015-11-24: 1000 mg via INTRAVENOUS
  Filled 2015-11-24: qty 200

## 2015-11-24 MED ORDER — PRO-STAT SUGAR FREE PO LIQD
30.0000 mL | Freq: Two times a day (BID) | ORAL | Status: DC
  Administered 2015-11-24 – 2015-11-28 (×7): 30 mL via ORAL
  Filled 2015-11-24 (×7): qty 30

## 2015-11-24 MED ORDER — ONDANSETRON HCL 4 MG PO TABS: 4.0000 mg | ORAL_TABLET | Freq: Four times a day (QID) | ORAL | Status: DC | PRN

## 2015-11-24 MED ORDER — ZOLPIDEM TARTRATE 5 MG PO TABS: 5.0000 mg | ORAL_TABLET | Freq: Every evening | ORAL | Status: DC | PRN

## 2015-11-24 MED ORDER — ENOXAPARIN SODIUM 30 MG/0.3ML ~~LOC~~ SOLN
30.0000 mg | SUBCUTANEOUS | Status: DC
  Administered 2015-11-24 – 2015-11-27 (×4): 30 mg via SUBCUTANEOUS
  Filled 2015-11-24 (×4): qty 0.3

## 2015-11-24 MED ORDER — LORAZEPAM 1 MG PO TABS
1.0000 mg | ORAL_TABLET | Freq: Once | ORAL | Status: AC
  Administered 2015-11-24: 1 mg via ORAL
  Filled 2015-11-24: qty 1

## 2015-11-24 MED ORDER — POLYETHYL GLYCOL-PROPYL GLYCOL 0.4-0.3 % OP GEL: Freq: Once | OPHTHALMIC | Status: DC

## 2015-11-24 MED ORDER — DARIFENACIN HYDROBROMIDE ER 7.5 MG PO TB24
7.5000 mg | ORAL_TABLET | Freq: Every day | ORAL | Status: DC
  Administered 2015-11-24 – 2015-11-28 (×5): 7.5 mg via ORAL
  Filled 2015-11-24 (×4): qty 1

## 2015-11-24 MED ORDER — MOMETASONE FURO-FORMOTEROL FUM 200-5 MCG/ACT IN AERO
2.0000 | INHALATION_SPRAY | Freq: Two times a day (BID) | RESPIRATORY_TRACT | Status: DC
  Administered 2015-11-24 – 2015-11-28 (×8): 2 via RESPIRATORY_TRACT
  Filled 2015-11-24: qty 8.8

## 2015-11-24 MED ORDER — ALBUTEROL SULFATE HFA 108 (90 BASE) MCG/ACT IN AERS: 2.0000 | INHALATION_SPRAY | Freq: Four times a day (QID) | RESPIRATORY_TRACT | Status: DC | PRN

## 2015-11-24 MED ORDER — SILVER SULFADIAZINE 1 % EX CREA
TOPICAL_CREAM | Freq: Two times a day (BID) | CUTANEOUS | Status: DC
Start: 2015-11-24 — End: 2015-11-25
  Administered 2015-11-24: 20:00:00 via TOPICAL
  Filled 2015-11-24: qty 85

## 2015-11-24 MED ORDER — ATORVASTATIN CALCIUM 40 MG PO TABS
40.0000 mg | ORAL_TABLET | Freq: Every day | ORAL | Status: DC
  Administered 2015-11-24 – 2015-11-27 (×4): 40 mg via ORAL
  Filled 2015-11-24 (×4): qty 1

## 2015-11-24 MED ORDER — ACETAMINOPHEN 650 MG RE SUPP: 650.0000 mg | Freq: Four times a day (QID) | RECTAL | Status: DC | PRN

## 2015-11-24 MED ORDER — ACETAMINOPHEN 325 MG PO TABS
650.0000 mg | ORAL_TABLET | Freq: Once | ORAL | Status: AC
  Administered 2015-11-24: 650 mg via ORAL
  Filled 2015-11-24: qty 2

## 2015-11-24 MED ORDER — VANCOMYCIN HCL IN DEXTROSE 1-5 GM/200ML-% IV SOLN
1000.0000 mg | INTRAVENOUS | Status: DC
  Administered 2015-11-26: 1000 mg via INTRAVENOUS
  Filled 2015-11-24: qty 200

## 2015-11-24 MED ORDER — MEGESTROL ACETATE 40 MG PO TABS
80.0000 mg | ORAL_TABLET | Freq: Every day | ORAL | Status: DC
  Administered 2015-11-24 – 2015-11-28 (×5): 80 mg via ORAL
  Filled 2015-11-24 (×6): qty 2

## 2015-11-24 MED ORDER — TIOTROPIUM BROMIDE MONOHYDRATE 18 MCG IN CAPS
1.0000 | ORAL_CAPSULE | Freq: Every day | RESPIRATORY_TRACT | Status: DC
  Administered 2015-11-24 – 2015-11-28 (×5): 18 ug via RESPIRATORY_TRACT
  Filled 2015-11-24: qty 5

## 2015-11-24 MED ORDER — LEVETIRACETAM 250 MG PO TABS
250.0000 mg | ORAL_TABLET | Freq: Two times a day (BID) | ORAL | Status: DC
  Administered 2015-11-24 – 2015-11-28 (×8): 250 mg via ORAL
  Filled 2015-11-24 (×8): qty 1

## 2015-11-24 MED ORDER — ONDANSETRON HCL 4 MG/2ML IJ SOLN: 4.0000 mg | Freq: Four times a day (QID) | INTRAMUSCULAR | Status: DC | PRN

## 2015-11-24 MED ORDER — LOPERAMIDE HCL 2 MG PO CAPS: 2.0000 mg | ORAL_CAPSULE | ORAL | Status: DC | PRN

## 2015-11-24 MED ORDER — MEMANTINE HCL 10 MG PO TABS
10.0000 mg | ORAL_TABLET | Freq: Every day | ORAL | Status: DC
  Administered 2015-11-25 – 2015-11-28 (×4): 10 mg via ORAL
  Filled 2015-11-24 (×4): qty 1

## 2015-11-24 MED ORDER — POTASSIUM CHLORIDE CRYS ER 20 MEQ PO TBCR
40.0000 meq | EXTENDED_RELEASE_TABLET | Freq: Once | ORAL | Status: AC
  Administered 2015-11-26: 40 meq via ORAL
  Filled 2015-11-24: qty 2

## 2015-11-24 MED ORDER — BUDESONIDE 3 MG PO CPEP
6.0000 mg | ORAL_CAPSULE | Freq: Every morning | ORAL | Status: DC
  Administered 2015-11-24 – 2015-11-28 (×5): 6 mg via ORAL
  Filled 2015-11-24 (×6): qty 2

## 2015-11-24 MED ORDER — POLYETHYL GLYCOL-PROPYL GLYCOL 0.4-0.3 % OP GEL
Freq: Three times a day (TID) | OPHTHALMIC | Status: DC | PRN
  Filled 2015-11-24: qty 10

## 2015-11-24 MED ORDER — L-METHYLFOLATE-B6-B12 3-35-2 MG PO TABS
1.0000 | ORAL_TABLET | Freq: Every day | ORAL | Status: DC
Start: 2015-11-24 — End: 2015-11-28
  Administered 2015-11-25 – 2015-11-28 (×4): 1 via ORAL
  Filled 2015-11-24 (×6): qty 1

## 2015-11-24 MED ORDER — PANTOPRAZOLE SODIUM 40 MG PO TBEC
40.0000 mg | DELAYED_RELEASE_TABLET | Freq: Every day | ORAL | Status: DC
  Administered 2015-11-25 – 2015-11-28 (×4): 40 mg via ORAL
  Filled 2015-11-24 (×4): qty 1

## 2015-11-24 MED ORDER — ACETAMINOPHEN 325 MG PO TABS
650.0000 mg | ORAL_TABLET | Freq: Four times a day (QID) | ORAL | Status: DC | PRN
Start: 2015-11-24 — End: 2015-11-28
  Administered 2015-11-25 – 2015-11-27 (×3): 650 mg via ORAL
  Filled 2015-11-24 (×3): qty 2

## 2015-11-24 MED ORDER — FERROUS SULFATE 325 (65 FE) MG PO TBEC
325.0000 mg | DELAYED_RELEASE_TABLET | Freq: Every day | ORAL | Status: DC
  Administered 2015-11-25 – 2015-11-28 (×4): 325 mg via ORAL
  Filled 2015-11-24 (×5): qty 1

## 2015-11-24 NOTE — H&P (Addendum)
TRH H&P   Patient Demographics:    Bridget Ball, is a 82 y.o. female  MRN: [id number]   DOB - 10/28/1928  Admit Date - 11/24/2015  Outpatient Primary MD for the patient is [name], MD  Outpatient Specialists: Dr Laural Golden, Dr. Beatrix Fetters  Patient coming from: Home   Chief Complaint  Patient presents with  . Recurrent Skin Infections      HPI:    Bridget Ball  is a 82 y.o. female, with a past medical history significant for COPD, dementia, GERD, CVA, subdural bleed due to fall complicated with seizures,, HLD  and recurrent cellulitis of the LE, presented with complaints of erythema and warmness in the right lower extremity that onset two days ago. Patient was seen in the ED two weeks ago with right leg cellulitis. She denies anyitching and burning. He does have pain in the right leg which is constant and dull, nonradiating worse by bearing weight and better with rest and pain medicines, While in the ED, she was started on IV vancomycin and admitted for further evaluation of cellulitis of the right leg.      Review of systems:    In addition to the HPI above,  No Fever-chills, No Headache, No changes with Vision or hearing, No problems swallowing food or Liquids, No Chest pain, Cough or Shortness of Breath, No Abdominal pain, No Nausea or Vommitting, Bowel movements are regular, No Blood in stool or Urine, No dysuria, +ve Right mild constant leg pain and erythema  No new joints pains-aches, Except right leg pain as above No new weakness, tingling, numbness in any extremity, No recent weight gain or loss, No polyuria, polydypsia or polyphagia, No significant Mental Stressors.  A full 10 point Review of  Systems was done, except as stated above, all other Review of Systems were negative.   With Past History of the following :    Past Medical History:  Diagnosis Date  . Anxiety   . Arthritis   . Attention to nephrostomy ([place])    PT HAS NEPHROSTOMY TUBE IN PLACE  . Bruises easily   . Cancer [facility])    Skin cancer  . Chronic diarrhea   . Colitis   . COPD (chronic obstructive pulmonary disease) ([place])   . Dementia   . Depression   . GERD (gastroesophageal reflux  disease)   . Hiatal hernia   . Hx of bronchitis   . Hx of pulmonary edema JULY 2015  . Hx of pulmonary edema JULY 2015  . Hx of septic shock JULY 2015  . Hydronephrosis, left   . Hyperlipidemia   . Memory difficulties   . Microscopic colitis   . Myocardial infarction    unknown time   . Seizures ([place])    last seizure was 4-5 years ago with "brain bleed"  . Shortness of breath    with exertion  . Stroke [facility])    TIA four years ago / stroke NOV 2014      Past Surgical History:  Procedure Laterality Date  . APPENDECTOMY    . BREAST BIOPSY Right 02/02/2014   Procedure: RIGHT BREAST BIOPSY;  Surgeon: Jamesetta So, MD;  Location: [facility] ORS;  Service: General;  Laterality: Right;  . COLONOSCOPY N/A 05/24/2013   Procedure: COLONOSCOPY;  Surgeon: Rogene Houston, MD;  Location: [facility] ENDO SUITE;  Service: Endoscopy;  Laterality: N/A;  100  . CYSTOSCOPY W/ URETERAL STENT PLACEMENT Left 08/06/2015   Procedure: CYSTOSCOPY WITH LEFT URETERAL STENT EXCHANGE - Sammie Bench;  Surgeon: Franchot Gallo, MD;  Location: [facility];  Service: Urology;  Laterality: Left;  . CYSTOSCOPY WITH RETROGRADE PYELOGRAM, URETEROSCOPY AND STENT PLACEMENT Left 09/18/2013   Procedure: CYSTOSCOPY WITH RETROGRADE PYELOGRAM,  URETEROSCOPY AND  STENT PLACEMENT, removal of nephrostomy tube;  Surgeon: Jorja Loa, MD;  Location: [facility];  Service: Urology;  Laterality: Left;  . CYSTOSCOPY WITH STENT PLACEMENT Left 09/04/2014   Procedure: CYSTOSCOPY WITH LEFT J2  STENT EXCHANGE;  Surgeon: Franchot Gallo, MD;  Location: [facility];  Service: Urology;  Laterality: Left;  . ESOPHAGOGASTRODUODENOSCOPY N/A 05/24/2013   Procedure: ESOPHAGOGASTRODUODENOSCOPY (EGD);  Surgeon: Rogene Houston, MD;  Location: [facility];  Service: Endoscopy;  Laterality: N/A;  . KNEE ARTHROSCOPY Right   . SKIN CANCER EXCISION    . TONSILLECTOMY    . TUBAL LIGATION        Social History:     Social History  Substance Use Topics  . Smoking status: Former Smoker    Packs/day: 1.00    Years: 50.00    Types: Cigarettes    Quit date: 11/13/1997  . Smokeless tobacco: Never Used  . Alcohol use 4.2 oz/week    7 Glasses of wine per week     Comment: 2 glasses a night wine (none in a year)       Family History :     Family History  Problem Relation Age of Onset  . Diabetes Mother   . Stroke Mother   . Heart attack Father     Home Medications:   Prior to Admission medications   Medication Sig Start Date End Date Taking? Authorizing Provider  albuterol (PROVENTIL HFA;VENTOLIN HFA) 108 (90 BASE) MCG/ACT inhaler Inhale 2 puffs into the lungs every 6 (six) hours as needed for wheezing or shortness of breath. 08/03/13  Yes Janece Canterbury, MD  atorvastatin (LIPITOR) 80 MG tablet Take 1 tablet (80 mg total) by mouth daily at 6 PM. Patient taking differently: Take 40 mg by mouth daily at 6 PM.  08/03/13  Yes Janece Canterbury, MD  budesonide (ENTOCORT EC) 3 MG 24 hr capsule Take 6 mg by mouth every morning.  03/20/15  Yes Historical Provider, MD  budesonide-formoterol (SYMBICORT) 160-4.5 MCG/ACT inhaler Inhale 2 puffs into the lungs 2 (two) times daily. 06/13/14  Yes Samuella Cota, MD  citalopram (CELEXA) 10 MG tablet TAKE 1 TABLET (10 MG TOTAL) BY MOUTH DAILY. 06/11/15  Yes Garvin Fila, MD  ferrous sulfate 325 (65 FE) MG EC tablet Take 325 mg by mouth daily with breakfast.   Yes Historical Provider, MD  levETIRAcetam (KEPPRA) 250 MG tablet Take 1 tablet (250 mg total) by  mouth 2 (two) times daily. 03/20/15  Yes Ward Givens, NP  loperamide (IMODIUM) 2 MG capsule Take 1 capsule (2 mg total) by mouth as needed for diarrhea or loose stools. 11/04/15  Yes Samuella Cota, MD  megestrol (MEGACE) 40 MG tablet Take 80 mg by mouth daily. 10/15/15  Yes Historical Provider, MD  memantine (NAMENDA) 5 MG tablet SIG:  Take one tablet daily for one week  then: take one tablet in AM and one tablet in PM for one week.  then: take one tablet in AM and two tablets in PM for one week.  then: take two tablets in AM and two tablets in PM. 11/20/15  Yes Ward Givens, NP  Methylfol-Algae-B12-Acetylcyst (CEREFOLIN NAC) 6-90.314-2-600 MG TABS Take 1 tablet by mouth daily. 10/18/14  Yes Garvin Fila, MD  pantoprazole (PROTONIX) 40 MG tablet TAKE ONE TABLET ONCE DAILY WITH BREAKFAST 10/23/15  Yes Butch Penny, NP  Polyethyl Glycol-Propyl Glycol (SYSTANE OP) Apply 1 drop to eye 3 (three) times daily as needed.   Yes Historical Provider, MD  solifenacin (VESICARE) 5 MG tablet Take 5 mg by mouth daily.   Yes Historical Provider, MD  SPIRIVA HANDIHALER 18 MCG inhalation capsule Place 1 capsule into inhaler and inhale daily. 05/20/14  Yes Historical Provider, MD  doxycycline (VIBRA-TABS) 100 MG tablet Take 1 tablet (100 mg total) by mouth every 12 (twelve) hours. 11/04/15   Samuella Cota, MD  silver sulfADIAZINE (SILVADENE) 1 % cream Apply topically 2 (two) times daily. Patient not taking: Reported on 11/24/2015 11/04/15   Samuella Cota, MD     Allergies:     Allergies - PCN   Physical Exam:   Vitals  Blood pressure 106/74, pulse 106, temperature 98.6 F (37 C), temperature source Oral, height 5' (1.524 m), weight 41.7 kg (92 lb), SpO2 96 %.   1. General 82 year-old extremely frail white woman lying in bed in NAD  2. Normal affect and insight, Not Suicidal or Homicidal, Awake Alert, Oriented X 3.  3. No F.N deficits, ALL C.Nerves Intact, Strength 5/5 all 4  extremities, Sensation intact all 4 extremities, Plantars down going.  4. Ears and Eyes appear Normal, Conjunctivae clear, PERRLA. Moist Oral Mucosa.  5. Supple Neck, No JVD, No cervical lymphadenopathy appriciated, No Carotid Bruits.  6. Symmetrical Chest wall movement, Good air movement bilaterally, CTAB.  7. RRR, No Gallops, Rubs or Murmurs, No Parasternal Heave.  8. Positive Bowel Sounds, Abdomen Soft, No tenderness, No organomegaly appriciated,No rebound -guarding or rigidity.  9.  No Cyanosis, Normal Skin Turgor, Erythema and warmness of the right LE, some streaking along the right lateral side of the leg, skin is extremely dry and flaking, 2 healing ulcers both mid tibia noted  10. Good muscle tone,  joints appear normal , no effusions, Normal ROM. Patient has erythema and tenderness in the right posterior calf and right distal posterior thigh.    11. No Palpable Lymph Nodes in Neck or Axillae    Data Review:    CBC  Recent Labs Lab 11/24/15 1013  WBC 12.9*  HGB 12.5  HCT 37.8  PLT 236  MCV  97.4  MCH 32.2  MCHC 33.1  RDW 15.5  LYMPHSABS 0.6*  MONOABS 0.5  EOSABS 0.1  BASOSABS 0.0   ------------------------------------------------------------------------------------------------------------------  Chemistries   Recent Labs Lab 11/24/15 1013  NA 137  K 3.4*  CL 107  CO2 24  GLUCOSE 116*  BUN 14  CREATININE 0.96  CALCIUM 9.2   ------------------------------------------------------------------------------------------------------------------ estimated creatinine clearance is 28.2 mL/min (by C-G formula based on SCr of 0.96 mg/dL). ------------------------------------------------------------------------------------------------------------------ No results for input(s): TSH, T4TOTAL, T3FREE, THYROIDAB in the last 72 hours.  Invalid input(s): FREET3  Coagulation profile No results for input(s): INR, PROTIME in the last 168  hours. ------------------------------------------------------------------------------------------------------------------- No results for input(s): DDIMER in the last 72 hours. -------------------------------------------------------------------------------------------------------------------  Cardiac Enzymes No results for input(s): CKMB, TROPONINI, MYOGLOBIN in the last 168 hours.  Invalid input(s): CK ------------------------------------------------------------------------------------------------------------------    Component Value Date/Time   BNP 24.0 11/08/2015 1331     ---------------------------------------------------------------------------------------------------------------  Urinalysis    Component Value Date/Time   COLORURINE YELLOW 11/08/2015 1331   APPEARANCEUR CLEAR 11/08/2015 1331   LABSPEC 1.015 11/08/2015 1331   PHURINE 6.0 11/08/2015 1331   GLUCOSEU NEGATIVE 11/08/2015 1331   HGBUR NEGATIVE 11/08/2015 1331   BILIRUBINUR NEGATIVE 11/08/2015 1331   KETONESUR NEGATIVE 11/08/2015 1331   PROTEINUR NEGATIVE 11/08/2015 1331   UROBILINOGEN 0.2 07/26/2013 2234   NITRITE NEGATIVE 11/08/2015 1331   LEUKOCYTESUR SMALL (A) 11/08/2015 1331    ----------------------------------------------------------------------------------------------------------------   Imaging Results:    No results found.  My personal review of EKG: Rhythm Is tachycardia 96 bpm, no Acute ST changes   Assessment & Plan:     1. Cellulitis of the right lower extremity. Follow Blood cultures. Will continue IV vancomycin. Will obtain an US of the right leg to rule out DVT along with ABI to rule out poor circulation.  2. HTN. Pressures are stable. Table currently not on blood pressure medications.  3. COPD. Compensated at this time. Continue budesonide and albuterol nebs. Oxygen and supportive care if needed.  4. HLD. Continue statin.   5. GERD. Continue Protonix.   6. Dementia. Noted, at  risk for delirium, for now continue Namenda, minimize narcotics and benzodiazepines.  7. Severe protein calorie malnutrition. Place on protein supplementation outpatient follow-up with PCP.  8. History of microscopic colitis. Continue Entocort, Imodium when necessary outpatient follow-up with Dr. Laural Golden.  9. History of UPJ obstruction status post nephrostomy tube placement. Currently stable follow with Dr. Beatrix Fetters.  10. History of SDH related seizures. Stable on Keppra continue.    DVT Prophylaxis Heparin -  Lovenox   AM Labs Ordered, also please review Full Orders  Family Communication: Admission, patients condition and plan of care including tests being ordered have been discussed with the patient and daughter who indicate understanding and agree with the plan and Code Status.  Code Status FULL   Likely DC to home   Condition GUARDED   Consults called: None   Admission status: Inpatient   Time spent in minutes : 35 minutes    Kenyon Ana M.D on 11/24/2015 at 12:05 PM  Between 7am to 7pm - Pager - [phone]. After 7pm go to [email or web address] - password [email or web address]  [facility] - Office  [phone]     By signing my name below, I, Collene Leyden, attest that this documentation has been prepared under the direction and in the presence of Lala Lund , MD. Electronically signed: Collene Leyden, Scribe. 11/24/15

## 2015-11-24 NOTE — Progress Notes (Addendum)
Pharmacy Antibiotic Note  Bridget Ball is a 82 y.o. female admitted on 11/24/2015 with cellulitis.  Pharmacy has been consulted for Vancomycin dosing.  Plan: Vancomycin 1000mg  IV every 48 hours.  Goal trough 10-15 mcg/mL. F/U cultures and clinical progress Vancomycin trough as indicated  Height: 5' (152.4 cm) Weight: 94 lb 6 oz (42.8 kg) IBW/kg (Calculated) : 45.5  Temp (24hrs), Avg:99.2 F (37.3 C), Min:98.6 F (37 C), Max:99.5 F (37.5 C)   Recent Labs Lab 11/24/15 1013  WBC 12.9*  CREATININE 0.96    Estimated Creatinine Clearance: 28.9 mL/min (by C-G formula based on SCr of 0.96 mg/dL).    Allergy:  PCN- anaphylaxis  Antimicrobials this admission: Vancomycin 11/19 >>   Dose adjustments this admission: n/a  Microbiology results: 11/19 BCx: pending  Thank you for allowing pharmacy to be a part of this patient's care. Isac Sarna, BS Pharm D, [place] Clinical Pharmacist Pager [phone] 11/24/2015 2:17 PM

## 2015-11-24 NOTE — ED Triage Notes (Signed)
Patient c/o right leg pain. Per daughter patient was admitted here in hospital for 3 days for cellulitis to right lower leg and given Vancomycin and discharged with doxycycline. Patient now has red streaking from right lower leg to upper thigh. Patient also has low grade fever.

## 2015-11-24 NOTE — ED Notes (Signed)
Pt complaining of HA. Tylenol ordered

## 2015-11-24 NOTE — ED Notes (Signed)
Patient transported to X-ray 

## 2015-11-24 NOTE — ED Provider Notes (Signed)
[facility] DEPT Provider Note   CSN: [id number] Arrival date & time: 11/24/15  [id number]   By signing my name below, I, Macon Large, attest that this documentation has been prepared under the direction and in the presence of Nat Christen, MD. Electronically Signed: Macon Large, ED Scribe. 11/24/15. 10:06 AM.  History   Chief Complaint Chief Complaint  Patient presents with  . Recurrent Skin Infections   The history is provided by a relative. No language interpreter was used.   LEVEL 5 CAVEAT: HPI and ROS limited due to dementia.  HPI Comments: Bridget Ball is a 82 y.o. female with Hx of dementia, who presents to the Emergency Department complaining of moderate, constant, recurrent skin infections that occurred yesterday. Pt's daughter notes pt was last seen in ED two weeks ago for similar symptoms. Daughter notes pt has skin infections on her right posterior calf and distal posterior right thigh. Pt reports associated pain accompanied with redness to the affected areas. Daughter states the associated pain is a new symptom compared to prior skin infection flare up. Daughter reports no modifying factors noted. She also notes pt had a fever of 99.5 PTA. She denies appetite change. Pt is unable to ambulate at this time due to pain, but was able to ambulate without difficulty yesterday prior to having pain. Daughter states Pt lives on her own, but notes Pt stays with her on occasions. Pt also reports neck pain. Daughter denies any recent trauma or injury. No additional complaints at this time.    Past Medical History:  Diagnosis Date  . Anxiety   . Arthritis   . Attention to nephrostomy ([place])    PT HAS NEPHROSTOMY TUBE IN PLACE  . Bruises easily   . Cancer [facility])    Skin cancer  . Chronic diarrhea   . Colitis   . COPD (chronic obstructive pulmonary disease) ([place])   . Dementia   . Depression   . GERD (gastroesophageal reflux disease)   . Hiatal hernia   . Hx of bronchitis     . Hx of pulmonary edema JULY 2015  . Hx of pulmonary edema JULY 2015  . Hx of septic shock JULY 2015  . Hydronephrosis, left   . Hyperlipidemia   . Memory difficulties   . Microscopic colitis   . Myocardial infarction    unknown time   . Seizures ([place])    last seizure was 4-5 years ago with "brain bleed"  . Shortness of breath    with exertion  . Stroke [facility])    TIA four years ago / stroke NOV 2014    Patient Active Problem List   Diagnosis Date Noted  . Cellulitis 11/01/2015  . Anemia 11/01/2015  . Wound cellulitis 11/01/2015  . Bronchospasm 06/12/2014  . Acute respiratory failure with hypoxia ([place]) 06/12/2014  . Allergic reaction   . Esophageal reflux 08/17/2013  . COPD (chronic obstructive pulmonary disease) ([place]) 08/11/2013  . Acute pyelonephritis 08/03/2013  . Septic shock due to Enterococcus species ([place]) 08/03/2013  . Septicemia ([place]) 08/03/2013  . Gram negative septic shock ([place]) 08/03/2013  . NSTEMI (non-ST elevated myocardial infarction) type 2 07/26/2013  . Dehydration 07/25/2013  . AKI (acute kidney injury) ([place]) 07/25/2013  . Elevated troponin 07/25/2013  . Colitis 07/08/2013  . Nausea and vomiting 07/08/2013  . Abdominal pain, unspecified site 07/08/2013  . Hydronephrosis, left 07/08/2013  . Microscopic colitis 06/27/2013  . Memory loss 04/25/2013  . Depression 04/25/2013  . Loss of weight 04/20/2013  .  Melena 04/20/2013  . Diarrhea 04/20/2013  . SAH (subarachnoid hemorrhage) ([place]) 11/22/2012  . Seizures secondary to [facility] 11/22/2012  . Cellulitis of leg, right 11/13/2012  . Open wound of leg 11/13/2012  . Oral thrush 11/13/2012    Past Surgical History:  Procedure Laterality Date  . APPENDECTOMY    . BREAST BIOPSY Right 02/02/2014   Procedure: RIGHT BREAST BIOPSY;  Surgeon: Jamesetta So, MD;  Location: [facility] ORS;  Service: General;  Laterality: Right;  . COLONOSCOPY N/A 05/24/2013   Procedure: COLONOSCOPY;  Surgeon: Rogene [place], MD;  Location:  [facility] ENDO SUITE;  Service: Endoscopy;  Laterality: N/A;  100  . CYSTOSCOPY W/ URETERAL STENT PLACEMENT Left 08/06/2015   Procedure: CYSTOSCOPY WITH LEFT URETERAL STENT EXCHANGE - Sammie Bench;  Surgeon: Franchot Gallo, MD;  Location: [facility] ORS;  Service: Urology;  Laterality: Left;  . CYSTOSCOPY WITH RETROGRADE PYELOGRAM, URETEROSCOPY AND STENT PLACEMENT Left 09/18/2013   Procedure: CYSTOSCOPY WITH RETROGRADE PYELOGRAM,  URETEROSCOPY AND  STENT PLACEMENT, removal of nephrostomy tube;  Surgeon: Jorja Loa, MD;  Location: [facility] ORS;  Service: Urology;  Laterality: Left;  . CYSTOSCOPY WITH STENT PLACEMENT Left 09/04/2014   Procedure: CYSTOSCOPY WITH LEFT J2 STENT EXCHANGE;  Surgeon: Franchot Gallo, MD;  Location: [facility] ORS;  Service: Urology;  Laterality: Left;  . ESOPHAGOGASTRODUODENOSCOPY N/A 05/24/2013   Procedure: ESOPHAGOGASTRODUODENOSCOPY (EGD);  Surgeon: Rogene Houston, MD;  Location: [facility] ENDO SUITE;  Service: Endoscopy;  Laterality: N/A;  . KNEE ARTHROSCOPY Right   . SKIN CANCER EXCISION    . TONSILLECTOMY    . TUBAL LIGATION      OB History    Gravida Para Term Preterm AB Living   5 5 5     5    SAB TAB Ectopic Multiple Live Births                   Home Medications    Prior to Admission medications   Medication Sig Start Date End Date Taking? Authorizing Provider  albuterol (PROVENTIL HFA;VENTOLIN HFA) 108 (90 BASE) MCG/ACT inhaler Inhale 2 puffs into the lungs every 6 (six) hours as needed for wheezing or shortness of breath. 08/03/13  Yes Janece Canterbury, MD  atorvastatin (LIPITOR) 80 MG tablet Take 1 tablet (80 mg total) by mouth daily at 6 PM. Patient taking differently: Take 40 mg by mouth daily at 6 PM.  08/03/13  Yes Janece Canterbury, MD  budesonide (ENTOCORT EC) 3 MG 24 hr capsule Take 6 mg by mouth every morning.  03/20/15  Yes Historical Provider, MD  budesonide-formoterol (SYMBICORT) 160-4.5 MCG/ACT inhaler Inhale 2 puffs into the lungs 2 (two) times daily. 06/13/14  Yes Samuella Cota, MD  citalopram (CELEXA) 10 MG tablet TAKE 1 TABLET (10 MG TOTAL) BY MOUTH DAILY. 06/11/15  Yes Garvin Fila, MD  ferrous sulfate 325 (65 FE) MG EC tablet Take 325 mg by mouth daily with breakfast.   Yes Historical Provider, MD  levETIRAcetam (KEPPRA) 250 MG tablet Take 1 tablet (250 mg total) by mouth 2 (two) times daily. 03/20/15  Yes Ward Givens, NP  loperamide (IMODIUM) 2 MG capsule Take 1 capsule (2 mg total) by mouth as needed for diarrhea or loose stools. 11/04/15  Yes Samuella Cota, MD  megestrol (MEGACE) 40 MG tablet Take 80 mg by mouth daily. 10/15/15  Yes Historical Provider, MD  memantine (NAMENDA) 5 MG tablet SIG:  Take one tablet daily for one week  then: take one tablet in AM  and one tablet in PM for one week.  then: take one tablet in AM and two tablets in PM for one week.  then: take two tablets in AM and two tablets in PM. 11/20/15  Yes Ward Givens, NP  Methylfol-Algae-B12-Acetylcyst (CEREFOLIN NAC) 6-90.314-2-600 MG TABS Take 1 tablet by mouth daily. 10/18/14  Yes Garvin Fila, MD  pantoprazole (PROTONIX) 40 MG tablet TAKE ONE TABLET ONCE DAILY WITH BREAKFAST 10/23/15  Yes Butch Penny, NP  Polyethyl Glycol-Propyl Glycol (SYSTANE OP) Apply 1 drop to eye 3 (three) times daily as needed.   Yes Historical Provider, MD  solifenacin (VESICARE) 5 MG tablet Take 5 mg by mouth daily.   Yes Historical Provider, MD  SPIRIVA HANDIHALER 18 MCG inhalation capsule Place 1 capsule into inhaler and inhale daily. 05/20/14  Yes Historical Provider, MD  doxycycline (VIBRA-TABS) 100 MG tablet Take 1 tablet (100 mg total) by mouth every 12 (twelve) hours. 11/04/15   Samuella Cota, MD  silver sulfADIAZINE (SILVADENE) 1 % cream Apply topically 2 (two) times daily. Patient not taking: Reported on 11/24/2015 11/04/15   Samuella Cota, MD    Family History Family History  Problem Relation Age of Onset  . Diabetes Mother   . Stroke Mother   . Heart attack Father      Social History Social History  Substance Use Topics  . Smoking status: Former Smoker    Packs/day: 1.00    Years: 50.00    Types: Cigarettes    Quit date: 11/13/1997  . Smokeless tobacco: Never Used  . Alcohol use 4.2 oz/week    7 Glasses of wine per week     Comment: 2 glasses a night wine (none in a year)     Allergies   Penicillins   Review of Systems Review of Systems  Unable to perform ROS: Dementia  Constitutional: Negative for appetite change.  Musculoskeletal: Positive for neck pain.  Skin: Positive for rash.  All other systems reviewed and are negative.    Physical Exam Updated Vital Signs BP 106/74 (BP Location: Left Arm)   Pulse 106   Temp 98.6 F (37 C) (Oral)   Ht 5' (1.524 m)   Wt 92 lb (41.7 kg)   SpO2 96%   BMI 17.97 kg/m   Physical Exam  Constitutional: She is oriented to person, place, and time. She appears well-developed and well-nourished.  HENT:  Head: Normocephalic and atraumatic.  Eyes: Conjunctivae are normal.  Neck: Neck supple.  Cardiovascular: Normal rate and regular rhythm.   Pulmonary/Chest: Effort normal and breath sounds normal.  Abdominal: Soft. Bowel sounds are normal.  Musculoskeletal: Normal range of motion.  Neurological: She is alert and oriented to person, place, and time.  Skin: Skin is warm and dry. There is erythema.  Erythema and tenderness in the right posterior calf and right distal posterior right thigh. Flaking and plaque-like structure.   Psychiatric: She has a normal mood and affect. Her behavior is normal.  Nursing note and vitals reviewed.    ED Treatments / Results   DIAGNOSTIC STUDIES: Oxygen Saturation is 97% on RA, normal by my interpretation.    COORDINATION OF CARE: 9:54 AM Discussed treatment plan with pt and daughter at bedside which includes labs and daughter agreed to plan.   Labs (all labs ordered are listed, but only abnormal results are displayed) Labs Reviewed  CBC WITH  DIFFERENTIAL/PLATELET - Abnormal; Notable for the following:       Result Value  WBC 12.9 (*)    Neutro Abs 11.7 (*)    Lymphs Abs 0.6 (*)    All other components within normal limits  BASIC METABOLIC PANEL - Abnormal; Notable for the following:    Potassium 3.4 (*)    Glucose, Bld 116 (*)    GFR calc non Af Amer 52 (*)    All other components within normal limits  CULTURE, BLOOD (ROUTINE X 2)  CULTURE, BLOOD (ROUTINE X 2)    EKG  EKG Interpretation None       Radiology No results found.  Procedures Procedures (including critical care time)  Medications Ordered in ED Medications  silver sulfADIAZINE (SILVADENE) 1 % cream (not administered)  albuterol (PROVENTIL HFA;VENTOLIN HFA) 108 (90 Base) MCG/ACT inhaler 2 puff (not administered)  darifenacin (ENABLEX) 24 hr tablet 7.5 mg (not administered)  tiotropium (SPIRIVA) inhalation capsule 18 mcg (not administered)  mometasone-formoterol (DULERA) 200-5 MCG/ACT inhaler 2 puff (not administered)  CEREFOLIN NAC 6-90.314-2-600 MG TABS 1 tablet (not administered)  levETIRAcetam (KEPPRA) tablet 250 mg (not administered)  budesonide (ENTOCORT EC) 24 hr capsule 6 mg (not administered)  pantoprazole (PROTONIX) EC tablet 40 mg (not administered)  megestrol (MEGACE) tablet 80 mg (not administered)  loperamide (IMODIUM) capsule 2 mg (not administered)  ferrous sulfate EC tablet 325 mg (not administered)  polyethylene glycol 0.4% and propylene glycol 0.3% (SYSTANE) ophthalmic gel (not administered)  memantine (NAMENDA) tablet 10 mg (not administered)  atorvastatin (LIPITOR) tablet 40 mg (not administered)  sodium chloride 0.9 % bolus 1,000 mL (0 mLs Intravenous Stopped 11/24/15 1200)  fentaNYL (SUBLIMAZE) injection 50 mcg (50 mcg Intravenous Given 11/24/15 1017)  vancomycin (VANCOCIN) IVPB 1000 mg/200 mL premix (0 mg Intravenous Stopped 11/24/15 1126)  fentaNYL (SUBLIMAZE) injection 50 mcg (50 mcg Intravenous Given 11/24/15 1123)      Initial Impression / Assessment and Plan / ED Course  I have reviewed the triage vital signs and the nursing notes.  Pertinent labs & imaging results that were available during my care of the patient were reviewed by me and considered in my medical decision making (see chart for details).  Pt will be given IV antibiotics and pain management. She will be admitted.  Clinical Course     Patient has recurrent cellulitis of the posterior aspect of the right lower extremity. IV vancomycin. Admit to general medicine.  Final Clinical Impressions(s) / ED Diagnoses   Final diagnoses:  Cellulitis of right lower extremity    New Prescriptions New Prescriptions   No medications on file    I personally performed the services described in this documentation, which was scribed in my presence. The recorded information has been reviewed and is accurate.      Nat Christen, MD 11/24/15 1201

## 2015-11-25 ENCOUNTER — Other Ambulatory Visit: Payer: Self-pay | Admitting: Neurology

## 2015-11-25 ENCOUNTER — Inpatient Hospital Stay (HOSPITAL_COMMUNITY): Payer: Medicare Other

## 2015-11-25 LAB — CBC
HEMATOCRIT: 32.9 % — AB (ref 36.0–46.0)
HEMOGLOBIN: 10.9 g/dL — AB (ref 12.0–15.0)
MCH: 32.3 pg (ref 26.0–34.0)
MCHC: 33.1 g/dL (ref 30.0–36.0)
MCV: 97.6 fL (ref 78.0–100.0)
Platelets: 210 10*3/uL (ref 150–400)
RBC: 3.37 MIL/uL — ABNORMAL LOW (ref 3.87–5.11)
RDW: 15.6 % — ABNORMAL HIGH (ref 11.5–15.5)
WBC: 15.4 10*3/uL — ABNORMAL HIGH (ref 4.0–10.5)

## 2015-11-25 LAB — BASIC METABOLIC PANEL
Anion gap: 8 (ref 5–15)
BUN: 14 mg/dL (ref 6–20)
CO2: 23 mmol/L (ref 22–32)
Calcium: 8.9 mg/dL (ref 8.9–10.3)
Chloride: 104 mmol/L (ref 101–111)
Creatinine, Ser: 0.71 mg/dL (ref 0.44–1.00)
GFR calc Af Amer: >60 mL/min (ref >60)
GFR calc non Af Amer: >60 mL/min (ref >60)
Glucose, Bld: 107 mg/dL — ABNORMAL HIGH (ref 65–99)
Potassium: 3.3 mmol/L — ABNORMAL LOW (ref 3.5–5.1)
Sodium: 135 mmol/L (ref 135–145)

## 2015-11-25 MED ORDER — LORAZEPAM 0.5 MG PO TABS
0.5000 mg | ORAL_TABLET | Freq: Every evening | ORAL | Status: DC | PRN
  Administered 2015-11-25 – 2015-11-27 (×3): 0.5 mg via ORAL
  Filled 2015-11-25 (×3): qty 1

## 2015-11-25 MED ORDER — DEXTROSE 5 % IV SOLN
1.0000 g | Freq: Three times a day (TID) | INTRAVENOUS | Status: DC
  Administered 2015-11-25 – 2015-11-28 (×8): 1 g via INTRAVENOUS
  Filled 2015-11-25 (×13): qty 1

## 2015-11-25 NOTE — Consult Note (Signed)
[facility] Nurse wound consult note Reason for Consult:RLE areas of full and partial thickness tissue loss, erythema.  Right elbow skin tear.  Frail elder. Resolved or resolving cellulitis. Wound type: Trauma (skin tear), venous insufficiency, PAD Pressure Ulcer POA: No Measurement:RLE:  Three lesions, two with dried serum (scab) formation.  Proximal measures 0.6cm x 1cm, distal measures 0.4cm x 1.5cm.  Both are firmly adherent.  RLE Medial aspect partial thickness tissue loss:  1cm x 1.5cm x 0.1cm with pink, mist wound bed, no exudate.  Right elbow with sickle-shaped skin tear, Grade 2, 50% approximated with remaining opening (partial thickness) measuring 1.5cm x 0.4cm x 0.1cm, red, moist with scant exudate. Wound bed:As described above. Drainage (amount, consistency, odor) As described above. Periwound:intact with evidence of previous injuries, no erythema, induration or warmth. Dressing procedure/placement/frequency:I have provided Nursing with conservative guidance via the orders for topical care of the RLE and right elbow. I have discontinued the Silvadene. Preventive skin care orders are placed in an effort to avoid tissue injuries due to pressure. [facility] nursing team will not follow, but will remain available to this patient, the nursing and medical teams.  Please re-consult if needed. Thanks, Maudie Flakes, MSN, RN, [place], [facility]  Pager# [phone]

## 2015-11-25 NOTE — Progress Notes (Addendum)
PROGRESS NOTE                                                                                                                                                                                                             Patient Demographics:    Bridget Ball, is a 82 y.o. female, DOB - 1928/01/13, QB:[id number]  Admit date - 11/24/2015   Admitting Physician Thurnell Lose, MD  Outpatient Primary MD for the patient is [name], MD  LOS - 1  Chief Complaint  Patient presents with  . Recurrent Skin Infections       Brief Narrative    Bridget Ball  is a 82 y.o. female, with a past medical history significant for COPD, dementia, GERD, CVA, subdural bleed due to fall complicated with seizures,, HLD  and recurrent cellulitis of the LE, presented with complaints of erythema and warmness in the right lower extremity that onset two days ago. Patient was seen in the ED two weeks ago with right leg cellulitis. She denies anyitching and burning. He does have pain in the right leg which is constant and dull, nonradiating worse by bearing weight and better with rest and pain medicines, While in the ED, she was started on IV vancomycin and admitted for further evaluation of cellulitis of the right leg.    Subjective:    [name] today has, No headache, No chest pain, No abdominal pain - No Nausea, No new weakness tingling or numbness, No Cough - SOB. She feels better.     Assessment  & Plan :     1. Cellulitis of the right lower extremity. Follow Blood cultures.WBC higher on empiric vancomycin which will be continued but will add Meropenem, mark the site to monitor. Will obtain an venous US of the right leg to rule out DVT along with ABI to rule out poor circulation.  2. HTN. Pressures are stable. Stable currently not on blood pressure medications.  3. COPD. Compensated at this time. Continue budesonide and albuterol nebs.  Oxygen and supportive care if needed.  4. HLD. Continue statin.   5. GERD. Continue Protonix.   6. Dementia. Noted, at risk for delirium, for now continue Namenda, minimize narcotics and benzodiazepines.  7. Severe protein calorie malnutrition. Placed on protein supplementation outpatient follow-up with PCP.  8. History of microscopic colitis. Continue  Entocort, Imodium when necessary outpatient follow-up with Dr. Laural Golden.  9. History of UPJ obstruction status post nephrostomy tube placement. Currently stable follow with Dr. Beatrix Fetters.  10. History of SDH related seizures. Stable on Keppra continue.  11. History of tibial ulcers. Wound care consulted. These were present prior to admission. Kindly see wound care brought for details.    Family Communication  :  Daughter on the day of admission  Code Status :  DNR  Diet : Diet regular Room service appropriate? Yes; Fluid consistency: Thin    Disposition Plan  :  HHPT 1-2 days  Consults  :  None  Procedures  :    Venous ultrasound of the legs ABI  DVT Prophylaxis  :  Lovenox    Lab Results  Component Value Date   PLT 210 11/25/2015    Inpatient Medications  Scheduled Meds: . atorvastatin  40 mg Oral q1800  . budesonide  6 mg Oral q morning - 10a  . CEREFOLIN NAC  1 tablet Oral Daily  . darifenacin  7.5 mg Oral Daily  . enoxaparin (LOVENOX) injection  30 mg Subcutaneous Q24H  . feeding supplement (PRO-STAT SUGAR FREE 64)  30 mL Oral BID  . ferrous sulfate  325 mg Oral Q breakfast  . levETIRAcetam  250 mg Oral BID  . megestrol  80 mg Oral Daily  . memantine  10 mg Oral Daily  . mometasone-formoterol  2 puff Inhalation BID  . pantoprazole  40 mg Oral Daily  . potassium chloride  40 mEq Oral Once  . tiotropium  1 capsule Inhalation Daily  . [START ON 11/26/2015] vancomycin  1,000 mg Intravenous Q48H   Continuous Infusions: PRN Meds:.acetaminophen **OR** acetaminophen, albuterol, loperamide, LORazepam,  ondansetron **OR** ondansetron (ZOFRAN) IV, polyvinyl alcohol  Antibiotics  :    Anti-infectives    Start     Dose/Rate Route Frequency Ordered Stop   11/26/15 1000  vancomycin (VANCOCIN) IVPB 1000 mg/200 mL premix     1,000 mg 200 mL/hr over 60 Minutes Intravenous Every 48 hours 11/24/15 1426     11/24/15 1015  vancomycin (VANCOCIN) IVPB 1000 mg/200 mL premix     1,000 mg 200 mL/hr over 60 Minutes Intravenous  Once 11/24/15 1008 11/24/15 1126         Objective:   Vitals:   11/24/15 1948 11/24/15 2200 11/25/15 0100 11/25/15 0606  BP:  110/61  118/69  Pulse:  (!) 111  (!) 101  Resp:  16  16  Temp:  99 F (37.2 C)  98.4 F (36.9 C)  TempSrc:  Oral  Oral  SpO2: 96% 97% 96% 94%  Weight:    42.8 kg (94 lb 5.8 oz)  Height:        Wt Readings from Last 3 Encounters:  11/25/15 42.8 kg (94 lb 5.8 oz)  11/08/15 41.3 kg (91 lb)  11/04/15 41.5 kg (91 lb 6.4 oz)     Intake/Output Summary (Last 24 hours) at 11/25/15 0806 Last data filed at 11/25/15 0606  Gross per 24 hour  Intake             1360 ml  Output             1150 ml  Net              210 ml     Physical Exam  Awake Alert, Oriented X 3, No new F.N deficits, Normal affect [place].AT,PERRAL Supple Neck,No JVD, No cervical lymphadenopathy appriciated.  Symmetrical Chest wall movement, Good air movement bilaterally, CTAB RRR,No Gallops,Rubs or new Murmurs, No Parasternal Heave +ve B.Sounds, Abd Soft, No tenderness, No organomegaly appriciated, No rebound - guarding or rigidity. No Cyanosis, Clubbing or edema, No new Rash or bruise , Rt Leg cellulitis/streakning improving, tibial ulcer under bandage.    Data Review:    CBC  Recent Labs Lab 11/24/15 1013 11/25/15 0556  WBC 12.9* 15.4*  HGB 12.5 10.9*  HCT 37.8 32.9*  PLT 236 210  MCV 97.4 97.6  MCH 32.2 32.3  MCHC 33.1 33.1  RDW 15.5 15.6*  LYMPHSABS 0.6*  --   MONOABS 0.5  --   EOSABS 0.1  --   BASOSABS 0.0  --     Chemistries   Recent Labs Lab  11/24/15 1013 11/25/15 0556  NA 137 135  K 3.4* 3.3*  CL 107 104  CO2 24 23  GLUCOSE 116* 107*  BUN 14 14  CREATININE 0.96 0.71  CALCIUM 9.2 8.9   ------------------------------------------------------------------------------------------------------------------ No results for input(s): CHOL, HDL, LDLCALC, TRIG, CHOLHDL, LDLDIRECT in the last 72 hours.  Lab Results  Component Value Date   HGBA1C  01/24/2008    5.7 (NOTE)   The ADA recommends the following therapeutic goal for glycemic   control related to Hgb A1C measurement:   Goal of Therapy:   < 7.0% Hgb A1C   Reference: American Diabetes Association: Clinical Practice   Recommendations 2008, Diabetes Care,  2008, 31:(Suppl 1).   ------------------------------------------------------------------------------------------------------------------  Recent Labs  11/24/15 1013  TSH 1.094   ------------------------------------------------------------------------------------------------------------------ No results for input(s): VITAMINB12, FOLATE, FERRITIN, TIBC, IRON, RETICCTPCT in the last 72 hours.  Coagulation profile  Recent Labs Lab 11/24/15 1013  INR 0.92    No results for input(s): DDIMER in the last 72 hours.  Cardiac Enzymes No results for input(s): CKMB, TROPONINI, MYOGLOBIN in the last 168 hours.  Invalid input(s): CK ------------------------------------------------------------------------------------------------------------------    Component Value Date/Time   BNP 24.0 11/08/2015 1331    Micro Results Recent Results (from the past 240 hour(s))  Culture, blood (routine x 2)     Status: None (Preliminary result)   Collection Time: 11/24/15 12:29 PM  Result Value Ref Range Status   Specimen Description BLOOD LEFT ARM  Final   Special Requests BOTTLES DRAWN AEROBIC AND ANAEROBIC 8CC  Final   Culture PENDING  Incomplete   Report Status PENDING  Incomplete  Culture, blood (routine x 2)     Status: None  (Preliminary result)   Collection Time: 11/24/15 12:30 PM  Result Value Ref Range Status   Specimen Description BLOOD LEFT ARM  Final   Special Requests BOTTLES DRAWN AEROBIC AND ANAEROBIC 8CC  Final   Culture PENDING  Incomplete   Report Status PENDING  Incomplete    Radiology Reports Dg Chest 2 View  Result Date: 11/08/2015 CLINICAL DATA:  Shortness of breath. EXAM: CHEST  2 VIEW COMPARISON:  06/12/2014. FINDINGS: Mediastinum and hilar structures normal. Lungs are clear. COPD. Heart size normal. Basilar pleural thickening noted consistent with scarring. No pneumothorax . IMPRESSION: COPD. Bilateral pleural-parenchymal scarring. No acute abnormality. Electronically Signed   By: Marcello Moores  Register   On: 11/08/2015 14:40   Dg Tibia/fibula Right  Result Date: 11/01/2015 CLINICAL DATA:  Chronic wound from a fall.  Swelling and pain EXAM: RIGHT TIBIA AND FIBULA - 2 VIEW COMPARISON:  None. FINDINGS: No acute fracture, malalignment, periostitis, or bone destruction. Moderate diffuse soft tissue swelling. Nonspecific soft tissue calcifications posterior calf. Vascular calcifications.  Probable phleboliths distal posterior soft tissues. Bones appear osteopenic. Moderate narrowing and spurring of the medial joint space compartment. Mild narrowing and spurring of the patellofemoral and lateral compartments. Possible faint joint space calcification suggests chondrocalcinosis. IMPRESSION: 1. No fracture or suspicious bone lesion identified. 2. Moderate degenerative changes 3. Suspect chondrocalcinosis. 4. Vascular calcification Electronically Signed   By: Donavan Foil M.D.   On: 11/01/2015 19:20   US Renal  Result Date: 11/06/2015 CLINICAL DATA:  Hydronephrosis EXAM: RENAL / URINARY TRACT ULTRASOUND COMPLETE COMPARISON:  6/20/ 17 FINDINGS: Right Kidney: Length: 10 cm. There is normal echogenicity. There is mild dilatation of right renal pelvis and proximal right ureter. No frank hydronephrosis is noted. No  renal calculi. Left Kidney: Length: 9.3 cm. Mild left hydronephrosis. Moderate dilatation of left renal pelvis up to 8.2 mm. Bladder: Appears normal for degree of bladder distention. Partially visualized ureteral stent. Prevoid bladder measures 118 cc. Postvoid the bladder measures 55 cc. IMPRESSION: 1. Mild dilatation of right renal pelvis without frank hydronephrosis. 2. There is mild left hydronephrosis. Moderate dilatation of left renal pelvis. 3. Partially visualized ureteral stent within bladder. Electronically Signed   By: Lahoma Crocker M.D.   On: 11/06/2015 17:05   US Venous Img Lower Unilateral Right  Result Date: 11/03/2015 CLINICAL DATA:  Right lower extremity swelling with cellulitis EXAM: RIGHT LOWER EXTREMITY VENOUS DUPLEX ULTRASOUND TECHNIQUE: Doppler venous assessment of the right lower extremity deep venous system was performed, including characterization of spectral flow, compressibility, and phasicity. COMPARISON:  None. FINDINGS: There is complete compressibility of the right common femoral, femoral, and popliteal veins. Doppler analysis demonstrates respiratory phasicity and augmentation of flow with calf compression. No obvious superficial vein or calf vein thrombosis. IMPRESSION: No evidence of right lower extremity DVT. Electronically Signed   By: Marybelle Killings M.D.   On: 11/03/2015 13:06    Time Spent in minutes  30   Donne Baley K M.D on 11/25/2015 at 8:06 AM  Between 7am to 7pm - Pager - [phone]  After 7pm go to [email or web address] - password [email or web address]  [facility] -  Office  [phone]

## 2015-11-25 NOTE — Evaluation (Signed)
Physical Therapy Evaluation Patient Details Name: Bridget Ball MRN: [id number] DOB: 08/27/1928 Today's Date: 11/25/2015   History of Present Illness  82 y.o. female, with a past medical history significant for COPD, dementia, GERD, CVA, subdural bleed due to fall complicated with seizures,, HLD  and recurrent cellulitis of the LE, presented with complaints of erythema and warmness in the right lower extremity that onset two days ago. Patient was seen in the ED two weeks ago with right leg cellulitis. She denies anyitching and burning. He does have pain in the right leg which is constant and dull, nonradiating worse by bearing weight and better with rest and pain medicines, While in the ED, she was started on IV vancomycin and admitted for further evaluation of cellulitis of the right leg.   Clinical Impression  Pt received in bed, and was agreeable to PT evaluation.  Pt expressed that normally, she is independent with ambulation and occasionally will use a cane.  She also states that she is independent with ADL's, and IADL's.  She is still driving, and is a Hydrographic surveyor  During PT evaluation, she was modified independent for all functional mobility, except required min guard for gait.  She was able to ambulate 128f with HHA x1 due to not having her cane present.  Pt would likely have been Mod (I) if she had her cane.  Pt does not demonstrate need for continued acute PT, however she demonstrates some balance deficits, and she is recommended to f/u with OPPT.      Follow Up Recommendations Outpatient PT (Pt may possibly benefit from OPPT for wound care as well as balance. )    Equipment Recommendations  None recommended by PT    Recommendations for Other Services       Precautions / Restrictions Precautions Precautions: None Restrictions Weight Bearing Restrictions: No      Mobility  Bed Mobility Overal bed mobility: Modified Independent                Transfers Overall  transfer level: Modified independent Equipment used: 1 person hand held assist                Ambulation/Gait Ambulation/Gait assistance: Min guard Ambulation Distance (Feet): 150 Feet Assistive device: 1 person hand held assist Gait Pattern/deviations: Step-through pattern;Trunk flexed;Staggering right;Staggering left     General Gait Details: Pt demonstrates 2 instances where she takes some staggered steps.  Pt able to self correct.   Stairs            Wheelchair Mobility    Modified Rankin (Stroke Patients Only)       Balance Overall balance assessment: Needs assistance Sitting-balance support: Feet supported;Bilateral upper extremity supported Sitting balance-Leahy Scale: Fair     Standing balance support: Single extremity supported Standing balance-Leahy Scale: Poor                               Pertinent Vitals/Pain Pain Assessment: No/denies pain    Home Living   Living Arrangements: Alone Available Help at Discharge:  (Pt has a handy man that she can call if she needs to. ) Type of Home: House Home Access: Ramped entrance     Home Layout: Two level;Able to live on main level with bedroom/bathroom Home Equipment: CKasandra Knudsen- single point      Prior Function Level of Independence: Independent with assistive device(s)   Gait / Transfers Assistance Needed: Pt was  using the canes to get around with some of the time, but not all the time.    ADL's / Homemaking Assistance Needed: independent with dressing, and bathing, driving, community ambulator.          Hand Dominance   Dominant Hand: Right    Extremity/Trunk Assessment   Upper Extremity Assessment: Overall WFL for tasks assessed           Lower Extremity Assessment: Overall WFL for tasks assessed         Communication      Cognition Arousal/Alertness: Awake/alert Behavior During Therapy: WFL for tasks assessed/performed Overall Cognitive Status: Within Functional  Limits for tasks assessed                      General Comments      Exercises     Assessment/Plan    PT Assessment All further PT needs can be met in the next venue of care  PT Problem List Decreased strength;Decreased activity tolerance;Decreased balance;Decreased mobility;Decreased skin integrity;Decreased safety awareness          PT Treatment Interventions      PT Goals (Current goals can be found in the Care Plan section)  Acute Rehab PT Goals PT Goal Formulation: All assessment and education complete, DC therapy    Frequency     Barriers to discharge        Co-evaluation               End of Session Equipment Utilized During Treatment: Gait belt Activity Tolerance: Patient tolerated treatment well Patient left: in bed;with call bell/phone within reach      Functional Assessment Tool Used: [place] "6-clicks"  Functional Limitation: Mobility: Walking and moving around Mobility: Walking and Moving Around Current Status [phone]): At least 1 percent but less than 20 percent impaired, limited or restricted Mobility: Walking and Moving Around Goal Status [phone]): At least 1 percent but less than 20 percent impaired, limited or restricted Mobility: Walking and Moving Around Discharge Status [phone]): At least 1 percent but less than 20 percent impaired, limited or restricted    Time: [id number] PT Time Calculation (min) (ACUTE ONLY): 27 min   Charges:   PT Evaluation $PT Eval Low Complexity: 1 Procedure PT Treatments $Gait Training: 8-22 mins   PT G Codes:   PT G-Codes **NOT FOR INPATIENT CLASS** Functional Assessment Tool Used: [employer] "6-clicks"  Functional Limitation: Mobility: Walking and moving around Mobility: Walking and Moving Around Current Status [phone]): At least 1 percent but less than 20 percent impaired, limited or restricted Mobility: Walking and Moving Around Goal Status [phone]): At least 1 percent  but less than 20 percent impaired, limited or restricted Mobility: Walking and Moving Around Discharge Status [phone]): At least 1 percent but less than 20 percent impaired, limited or restricted    Beth Tequila Rottmann, PT, DPT X: [phone]

## 2015-11-25 NOTE — Progress Notes (Signed)
Pharmacy Antibiotic Note  Bridget Ball is a 82 y.o. female admitted on 11/24/2015 with cellulitis.  Pharmacy has been consulted for Vancomycin and Aztreonam dosing.  Plan: Aztreonam 1gm IV q8h Vancomycin 1000mg  IV every 48 hours.  Goal trough 10-15 mcg/mL. F/U cultures and clinical progress Vancomycin trough as indicated  Height: 5' (152.4 cm) Weight: 94 lb 5.8 oz (42.8 kg) IBW/kg (Calculated) : 45.5  Temp (24hrs), Avg:98.9 F (37.2 C), Min:98.4 F (36.9 C), Max:99.4 F (37.4 C)   Recent Labs Lab 11/24/15 1013 11/25/15 0556  WBC 12.9* 15.4*  CREATININE 0.96 0.71    Estimated Creatinine Clearance: 34.7 mL/min (by C-G formula based on SCr of 0.71 mg/dL).    Allergy:  PCN- anaphylaxis  Antimicrobials this admission: Vancomycin 11/19 >>  Aztreonam 11/20>> Dose adjustments this admission: n/a  Microbiology results: 11/19 BCx: no growth  Thank you for allowing pharmacy to be a part of this patient's care. Isac Sarna, BS Pharm D, [place] Clinical Pharmacist Pager [phone] 11/25/2015 11:12 AM

## 2015-11-26 ENCOUNTER — Inpatient Hospital Stay (HOSPITAL_COMMUNITY): Payer: Medicare Other

## 2015-11-26 ENCOUNTER — Other Ambulatory Visit (HOSPITAL_COMMUNITY): Payer: Self-pay | Admitting: Radiology

## 2015-11-26 ENCOUNTER — Inpatient Hospital Stay (HOSPITAL_COMMUNITY): Admit: 2015-11-26 | Payer: Medicare Other

## 2015-11-26 LAB — CBC
HEMATOCRIT: 32.4 % — AB (ref 36.0–46.0)
HEMOGLOBIN: 10.5 g/dL — AB (ref 12.0–15.0)
MCH: 31.9 pg (ref 26.0–34.0)
MCHC: 32.4 g/dL (ref 30.0–36.0)
MCV: 98.5 fL (ref 78.0–100.0)
Platelets: 204 10*3/uL (ref 150–400)
RBC: 3.29 MIL/uL — ABNORMAL LOW (ref 3.87–5.11)
RDW: 15.4 % (ref 11.5–15.5)
WBC: 10.3 10*3/uL (ref 4.0–10.5)

## 2015-11-26 MED ORDER — CALCIUM CARBONATE ANTACID 500 MG PO CHEW
1.0000 | CHEWABLE_TABLET | Freq: Three times a day (TID) | ORAL | Status: DC | PRN
  Administered 2015-11-26: 200 mg via ORAL
  Filled 2015-11-26: qty 1

## 2015-11-26 NOTE — Progress Notes (Signed)
PROGRESS NOTE                                                                                                                                                                                                             Patient Demographics:    Bridget Ball, is a 82 y.o. female, DOB - 10-Sep-1928, [id number]  Admit date - 11/24/2015   Admitting Physician Thurnell Lose, MD  Outpatient Primary MD for the patient is [name], MD  LOS - 2  Chief Complaint  Patient presents with  . Recurrent Skin Infections       Brief Narrative    Bridget Ball  is a 82 y.o. female, with a past medical history significant for COPD, dementia, GERD, CVA, subdural bleed due to fall complicated with seizures,, HLD  and recurrent cellulitis of the LE, presented with complaints of erythema and warmness in the right lower extremity that onset two days ago. Patient was seen in the ED two weeks ago with right leg cellulitis. She denies anyitching and burning. He does have pain in the right leg which is constant and dull, nonradiating worse by bearing weight and better with rest and pain medicines, Was initially started on IV vancomycin without much benefit, meropenem was added yesterday with remarkable improvement.    Subjective:    Bridget Ball today has, No headache, No chest pain, No abdominal pain - No Nausea, No new weakness tingling or numbness, No Cough - SOB. She feels better.     Assessment  & Plan :     1. Cellulitis of the right lower extremity. Follow Blood cultures.WBC higher on empiric vancomycin which will be continued but will add Meropenem, mark the site to monitor. Negative venous duplex ruling out DVT, ABIs elevated suggesting chronic calcification in both lower extremities . After addition of meropenem her cellulitis is remarkably better. She did not respond well to outpatient doxycycline and IV vancomycin alone suggesting  clinically she probably has a gram-negative bug. If continues to improve she can be placed on combination of Bactrim with close renal function monitoring along with Vantin for 5 more days.  2. HTN. Pressures are stable. Stable currently not on blood pressure medications.  3. COPD. Compensated at this time. Continue budesonide and albuterol nebs. Oxygen and supportive care if needed.  4. HLD. Continue statin.  5. GERD. Continue Protonix.   6. Dementia. Noted, at risk for delirium, for now continue Namenda, minimize narcotics and benzodiazepines.  7. Severe protein calorie malnutrition. Placed on protein supplementation outpatient follow-up with PCP.  8. History of microscopic colitis. Continue Entocort, Imodium when necessary outpatient follow-up with Dr. Laural Golden.  9. History of UPJ obstruction status post nephrostomy tube placement. Currently stable follow with Dr. Beatrix Fetters.  10. History of SDH related seizures. Stable on Keppra continue.  11. History of tibial ulcers. Wound care consulted. These were present prior to admission. Kindly see wound care brought for details.  12. Hypokalemia. Replaced. Recheck in the morning with magnesium.    Family Communication  :  Daughter on the day of admission  Code Status :  DNR  Diet : Diet regular Room service appropriate? Yes; Fluid consistency: Thin    Disposition Plan  :  HHPT 1-2 days  Consults  :  None  Procedures  :    Venous ultrasound of the legs ABI  DVT Prophylaxis  :  Lovenox    Lab Results  Component Value Date   PLT 204 11/26/2015    Inpatient Medications  Scheduled Meds: . atorvastatin  40 mg Oral q1800  . aztreonam  1 g Intravenous Q8H  . budesonide  6 mg Oral q morning - 10a  . darifenacin  7.5 mg Oral Daily  . enoxaparin (LOVENOX) injection  30 mg Subcutaneous Q24H  . feeding supplement (PRO-STAT SUGAR FREE 64)  30 mL Oral BID  . ferrous sulfate  325 mg Oral Q breakfast  .  l-methylfolate-B6-B12  1 tablet Oral Daily  . levETIRAcetam  250 mg Oral BID  . megestrol  80 mg Oral Daily  . memantine  10 mg Oral Daily  . mometasone-formoterol  2 puff Inhalation BID  . pantoprazole  40 mg Oral Daily  . potassium chloride  40 mEq Oral Once  . tiotropium  1 capsule Inhalation Daily  . vancomycin  1,000 mg Intravenous Q48H   Continuous Infusions: PRN Meds:.acetaminophen **OR** acetaminophen, albuterol, loperamide, LORazepam, ondansetron **OR** ondansetron (ZOFRAN) IV, polyvinyl alcohol  Antibiotics  :    Anti-infectives    Start     Dose/Rate Route Frequency Ordered Stop   11/26/15 1000  vancomycin (VANCOCIN) IVPB 1000 mg/200 mL premix     1,000 mg 200 mL/hr over 60 Minutes Intravenous Every 48 hours 11/24/15 1426     11/25/15 1300  aztreonam (AZACTAM) 1 g in dextrose 5 % 50 mL IVPB     1 g 100 mL/hr over 30 Minutes Intravenous Every 8 hours 11/25/15 1146     11/24/15 1015  vancomycin (VANCOCIN) IVPB 1000 mg/200 mL premix     1,000 mg 200 mL/hr over 60 Minutes Intravenous  Once 11/24/15 1008 11/24/15 1126         Objective:   Vitals:   11/25/15 2013 11/25/15 2138 11/26/15 0450 11/26/15 0850  BP:  (!) 98/59 109/67   Pulse:  93 89   Resp:   16   Temp:  99 F (37.2 C) 98.2 F (36.8 C)   TempSrc:  Oral Oral   SpO2: 92% 98% 96% 95%  Weight:   42.4 kg (93 lb 8 oz)   Height:        Wt Readings from Last 3 Encounters:  11/26/15 42.4 kg (93 lb 8 oz)  11/08/15 41.3 kg (91 lb)  11/04/15 41.5 kg (91 lb 6.4 oz)     Intake/Output Summary (Last 24 hours)  at 11/26/15 0914 Last data filed at 11/25/15 1853  Gross per 24 hour  Intake              480 ml  Output              200 ml  Net              280 ml     Physical Exam  Awake Alert, Oriented X 3, No new F.N deficits, Normal affect [place].AT,PERRAL Supple Neck,No JVD, No cervical lymphadenopathy appriciated.  Symmetrical Chest wall movement, Good air movement bilaterally, CTAB RRR,No Gallops,Rubs or  new Murmurs, No Parasternal Heave +ve B.Sounds, Abd Soft, No tenderness, No organomegaly appriciated, No rebound - guarding or rigidity. No Cyanosis, Clubbing or edema, No new Rash or bruise , Rt Leg cellulitis/streakning improving, tibial ulcer under bandage.    Data Review:    CBC  Recent Labs Lab 11/24/15 1013 11/25/15 0556 11/26/15 0613  WBC 12.9* 15.4* 10.3  HGB 12.5 10.9* 10.5*  HCT 37.8 32.9* 32.4*  PLT 236 210 204  MCV 97.4 97.6 98.5  MCH 32.2 32.3 31.9  MCHC 33.1 33.1 32.4  RDW 15.5 15.6* 15.4  LYMPHSABS 0.6*  --   --   MONOABS 0.5  --   --   EOSABS 0.1  --   --   BASOSABS 0.0  --   --     Chemistries   Recent Labs Lab 11/24/15 1013 11/25/15 0556  NA 137 135  K 3.4* 3.3*  CL 107 104  CO2 24 23  GLUCOSE 116* 107*  BUN 14 14  CREATININE 0.96 0.71  CALCIUM 9.2 8.9   ------------------------------------------------------------------------------------------------------------------ No results for input(s): CHOL, HDL, LDLCALC, TRIG, CHOLHDL, LDLDIRECT in the last 72 hours.  Lab Results  Component Value Date   HGBA1C  01/24/2008    5.7 (NOTE)   The ADA recommends the following therapeutic goal for glycemic   control related to Hgb A1C measurement:   Goal of Therapy:   < 7.0% Hgb A1C   Reference: American Diabetes Association: Clinical Practice   Recommendations 2008, Diabetes Care,  2008, 31:(Suppl 1).   ------------------------------------------------------------------------------------------------------------------  Recent Labs  11/24/15 1013  TSH 1.094   ------------------------------------------------------------------------------------------------------------------ No results for input(s): VITAMINB12, FOLATE, FERRITIN, TIBC, IRON, RETICCTPCT in the last 72 hours.  Coagulation profile  Recent Labs Lab 11/24/15 1013  INR 0.92    No results for input(s): DDIMER in the last 72 hours.  Cardiac Enzymes No results for input(s): CKMB,  TROPONINI, MYOGLOBIN in the last 168 hours.  Invalid input(s): CK ------------------------------------------------------------------------------------------------------------------    Component Value Date/Time   BNP 24.0 11/08/2015 1331    Micro Results Recent Results (from the past 240 hour(s))  Culture, blood (routine x 2)     Status: None (Preliminary result)   Collection Time: 11/24/15 12:29 PM  Result Value Ref Range Status   Specimen Description BLOOD LEFT ARM  Final   Special Requests BOTTLES DRAWN AEROBIC AND ANAEROBIC 8CC  Final   Culture NO GROWTH < 24 HOURS  Final   Report Status PENDING  Incomplete  Culture, blood (routine x 2)     Status: None (Preliminary result)   Collection Time: 11/24/15 12:30 PM  Result Value Ref Range Status   Specimen Description BLOOD LEFT ARM  Final   Special Requests BOTTLES DRAWN AEROBIC AND ANAEROBIC 8CC  Final   Culture NO GROWTH < 24 HOURS  Final   Report Status PENDING  Incomplete  Radiology Reports Dg Chest 2 View  Result Date: 11/08/2015 CLINICAL DATA:  Shortness of breath. EXAM: CHEST  2 VIEW COMPARISON:  06/12/2014. FINDINGS: Mediastinum and hilar structures normal. Lungs are clear. COPD. Heart size normal. Basilar pleural thickening noted consistent with scarring. No pneumothorax . IMPRESSION: COPD. Bilateral pleural-parenchymal scarring. No acute abnormality. Electronically Signed   By: Marcello Moores  Register   On: 11/08/2015 14:40   Dg Tibia/fibula Right  Result Date: 11/01/2015 CLINICAL DATA:  Chronic wound from a fall.  Swelling and pain EXAM: RIGHT TIBIA AND FIBULA - 2 VIEW COMPARISON:  None. FINDINGS: No acute fracture, malalignment, periostitis, or bone destruction. Moderate diffuse soft tissue swelling. Nonspecific soft tissue calcifications posterior calf. Vascular calcifications. Probable phleboliths distal posterior soft tissues. Bones appear osteopenic. Moderate narrowing and spurring of the medial joint space compartment.  Mild narrowing and spurring of the patellofemoral and lateral compartments. Possible faint joint space calcification suggests chondrocalcinosis. IMPRESSION: 1. No fracture or suspicious bone lesion identified. 2. Moderate degenerative changes 3. Suspect chondrocalcinosis. 4. Vascular calcification Electronically Signed   By: Donavan Foil M.D.   On: 11/01/2015 19:20   US Renal  Result Date: 11/06/2015 CLINICAL DATA:  Hydronephrosis EXAM: RENAL / URINARY TRACT ULTRASOUND COMPLETE COMPARISON:  6/20/ 17 FINDINGS: Right Kidney: Length: 10 cm. There is normal echogenicity. There is mild dilatation of right renal pelvis and proximal right ureter. No frank hydronephrosis is noted. No renal calculi. Left Kidney: Length: 9.3 cm. Mild left hydronephrosis. Moderate dilatation of left renal pelvis up to 8.2 mm. Bladder: Appears normal for degree of bladder distention. Partially visualized ureteral stent. Prevoid bladder measures 118 cc. Postvoid the bladder measures 55 cc. IMPRESSION: 1. Mild dilatation of right renal pelvis without frank hydronephrosis. 2. There is mild left hydronephrosis. Moderate dilatation of left renal pelvis. 3. Partially visualized ureteral stent within bladder. Electronically Signed   By: Lahoma Crocker M.D.   On: 11/06/2015 17:05   US Venous Img Lower Unilateral Right  Result Date: 11/25/2015 CLINICAL DATA:  Acute on chronic cellulitis EXAM: RIGHT LOWER EXTREMITY VENOUS DOPPLER ULTRASOUND TECHNIQUE: Gray-scale sonography with compression, as well as color and duplex ultrasound, were performed to evaluate the deep venous system from the level of the common femoral vein through the popliteal and proximal calf veins. COMPARISON:  11/03/2015 FINDINGS: Normal compressibility of the common femoral, superficial femoral, and popliteal veins, as well as the proximal calf veins. No filling defects to suggest DVT on grayscale or color Doppler imaging. Doppler waveforms show normal direction of venous flow,  normal respiratory phasicity and response to augmentation. Visualized segments of the saphenous venous system normal in caliber and compressibility. Survey views of the contralateral common femoral vein are unremarkable. IMPRESSION: No evidence of  lower extremity deep vein thrombosis, right. Electronically Signed   By: Lucrezia Europe M.D.   On: 11/25/2015 10:51   US Venous Img Lower Unilateral Right  Result Date: 11/03/2015 CLINICAL DATA:  Right lower extremity swelling with cellulitis EXAM: RIGHT LOWER EXTREMITY VENOUS DUPLEX ULTRASOUND TECHNIQUE: Doppler venous assessment of the right lower extremity deep venous system was performed, including characterization of spectral flow, compressibility, and phasicity. COMPARISON:  None. FINDINGS: There is complete compressibility of the right common femoral, femoral, and popliteal veins. Doppler analysis demonstrates respiratory phasicity and augmentation of flow with calf compression. No obvious superficial vein or calf vein thrombosis. IMPRESSION: No evidence of right lower extremity DVT. Electronically Signed   By: Marybelle Killings M.D.   On: 11/03/2015 13:06   US  Arterial Seg Single  Result Date: 11/25/2015 CLINICAL DATA:  Bilateral toe ulcers. Right lower extremity cellulitis. EXAM: NONINVASIVE PHYSIOLOGIC VASCULAR STUDY OF BILATERAL LOWER EXTREMITIES TECHNIQUE: Evaluation of both lower extremities were performed at rest, including calculation of ankle-brachial indices with single level Doppler, pressure and pulse volume recording. COMPARISON:  None. FINDINGS: Right ABI:  1.46 Left ABI:  1.80 Right Lower Extremity: Normal triphasic Doppler waveform in the right posterior tibial artery. Left Lower Extremity: Triphasic Doppler waveforms in the left ankle. IMPRESSION: Ankle-brachial indices are markedly elevated. Findings suggest heavily calcified arteries. No significant abnormality involving the ankle waveforms. Electronically Signed   By: Markus Daft M.D.   On:  11/25/2015 10:26    Time Spent in minutes  30   Bridget Ball K M.D on 11/26/2015 at 9:14 AM  Between 7am to 7pm - Pager - [phone]  After 7pm go to [email or web address] - password [facility]  [facility] -  Office  [phone]

## 2015-11-26 NOTE — Consult Note (Signed)
   [facility] CM Inpatient Consult   11/26/2015  Bridget Ball [birth year] [id number]   Chart review revealed patient eligible for [facility] services and post hospital discharge follow up related to a diagnosis of COPD. Patient was evaluated for community based chronic disease management services with [facility] care Management Program as a benefit of patient's [employer]. Met with the patient at the bedside to explain [facility] services. Patient endorses her primary care provider to be Alesia Morin. Patient states she plans to go home at discharge. Patient states she is concerned because she has had a large weight loss recently. Consent form signed. Patient gave [phone] as the best number to reach her. She also gave written permission to call her daughter Karin Golden at [phone] if she can not be reached.Patient will receive post hospital discharge calls and be evaluated for monthly home visits. [facility] Care Management services does not interfere with or replace any services arranged by the inpatient care management team. RNCM left contact information and [facility] literature at the bedside. Made inpatient RNCM aware that [facility] will be following for care management. For additional questions please contact:   Marna Weniger RN, [facility] Liaison  [phone]) Business Mobile [phone]) Toll free office

## 2015-11-26 NOTE — Care Management Note (Signed)
Case Management Note  Patient Details  Name: Bridget Ball MRN: [id number] Date of Birth: 1928-07-06  Subjective/Objective:                  Pt is from home, admitted for cellulitis. Pt lives alone and has family who checks on her. Pt refuses HH and has been recommended for OP PT. Pt is agreeable to this and would like to go here in [place]. Referral ordered for OP rehab in [place]. MD anticipates DC home tomorrow.   Action/Plan: No further CM needs anticipated.   Expected Discharge Date:  11/27/15               Expected Discharge Plan:  Home/Self Care  In-House Referral:  NA  Discharge planning Services  CM Consult  Post Acute Care Choice:  NA Choice offered to:  NA  Status of Service:  Completed, signed off  Sherald Barge, RN 11/26/2015, 1:20 PM

## 2015-11-27 ENCOUNTER — Other Ambulatory Visit: Payer: Self-pay

## 2015-11-27 ENCOUNTER — Other Ambulatory Visit: Payer: Self-pay | Admitting: *Deleted

## 2015-11-27 ENCOUNTER — Telehealth: Payer: Self-pay

## 2015-11-27 DIAGNOSIS — R413 Other amnesia: Secondary | ICD-10-CM

## 2015-11-27 DIAGNOSIS — J42 Unspecified chronic bronchitis: Secondary | ICD-10-CM

## 2015-11-27 DIAGNOSIS — E785 Hyperlipidemia, unspecified: Secondary | ICD-10-CM

## 2015-11-27 LAB — BASIC METABOLIC PANEL
Anion gap: 6 (ref 5–15)
BUN: 17 mg/dL (ref 6–20)
CHLORIDE: 110 mmol/L (ref 101–111)
CO2: 23 mmol/L (ref 22–32)
CREATININE: 0.77 mg/dL (ref 0.44–1.00)
Calcium: 9.2 mg/dL (ref 8.9–10.3)
GFR calc non Af Amer: >60 mL/min (ref >60)
GLUCOSE: 129 mg/dL — AB (ref 65–99)
Potassium: 3.9 mmol/L (ref 3.5–5.1)
Sodium: 139 mmol/L (ref 135–145)

## 2015-11-27 LAB — MAGNESIUM: Magnesium: 1.8 mg/dL (ref 1.7–2.4)

## 2015-11-27 MED ORDER — CEREFOLIN NAC 6-90.314-2-600 MG PO TABS
1.0000 | ORAL_TABLET | Freq: Every day | ORAL | 3 refills | Status: DC
Start: 2015-11-27 — End: 2015-11-27

## 2015-11-27 MED ORDER — CEREFOLIN NAC 6-90.314-2-600 MG PO TABS
1.0000 | ORAL_TABLET | Freq: Every day | ORAL | 3 refills | Status: DC
Start: 2015-11-27 — End: 2015-12-02

## 2015-11-27 NOTE — Progress Notes (Signed)
PROGRESS NOTE    Bridget Ball  [id number] DOB: October 12, 1928 DOA: 11/24/2015 PCP: [name], MD    Brief Narrative: [name],with a past medical history significant for COPD, dementia, GERD, CVA, subdural bleed due to fall complicated with seizures,, HLD and recurrent cellulitis of the [place], presented with complaints of erythema and warmness in the right lower extremity and was Tx with IV Van/Doxy with no improvement.  Azactam was added and she improved.    Assessment & Plan:   Principal Problem:   Cellulitis of leg, right Active Problems:   Seizures secondary to [facility]   Memory loss   Microscopic colitis   NSTEMI (non-ST elevated myocardial infarction) type 2   COPD (chronic obstructive pulmonary disease) (HCC)   Esophageal reflux   Cellulitis of right leg   Dyslipidemia   Cellulitis of right lower leg   1. Cellulitis:  Improved.  Will d/c Lucianne Lei and continue with Azactam.   I will try to d/c her tomorrow.  PT has evaluated her and recommended out patient PT.   2. HTN. Pressures are stable. Stable currently not on blood pressure medications.  3. COPD. Compensated at this time. Continue budesonide and albuterol nebs. Oxygen and supportive care if needed.  4. HLD. Continue statin.   5. GERD. Continue Protonix.   6. Dementia. Noted, at risk for delirium, for now continue Namenda, minimize narcotics and benzodiazepines.  7.Severe protein calorie malnutrition. Placed on protein supplementation outpatient follow-up with PCP.  8.History of microscopic colitis. Continue Entocort, Imodium when necessary outpatient follow-up with Dr. Laural Golden.  9.History of UPJ obstruction status post nephrostomy tube placement. Currently stable follow with Dr. Beatrix Fetters.  10.History of SDH related seizures. Stable on Keppra continue.  11. History of tibial ulcers. Wound care consulted. These were present prior to admission. Kindly see wound care brought for  details.  12. Hypokalemia. Replaced. Recheck in the morning with magnesium.  DVT prophylaxis: Lovenox Code Status: DNR.  Family Communication: None.  Disposition Plan: Home with PT   Consultants:   None.   Procedures:   None.   Antimicrobials: Anti-infectives    Start     Dose/Rate Route Frequency Ordered Stop   11/26/15 1000  vancomycin (VANCOCIN) IVPB 1000 mg/200 mL premix  Status:  Discontinued     1,000 mg 200 mL/hr over 60 Minutes Intravenous Every 48 hours 11/24/15 1426 11/27/15 0844   11/25/15 1300  aztreonam (AZACTAM) 1 g in dextrose 5 % 50 mL IVPB     1 g 100 mL/hr over 30 Minutes Intravenous Every 8 hours 11/25/15 1146     11/24/15 1015  vancomycin (VANCOCIN) IVPB 1000 mg/200 mL premix     1,000 mg 200 mL/hr over 60 Minutes Intravenous  Once 11/24/15 1008 11/24/15 1126       Subjective: Leg is feeling better.  Not ready to go home today.  Objective: Vitals:   11/26/15 2153 11/27/15 0617 11/27/15 0809 11/27/15 0810  BP: (!) 94/50 101/67    Pulse: (!) 42 93    Resp: 20 20    Temp: 98.7 F (37.1 C) 98.3 F (36.8 C)    TempSrc: Oral Oral    SpO2: 96% 97% 98% 99%  Weight:  41.4 kg (91 lb 3.2 oz)    Height:        Intake/Output Summary (Last 24 hours) at 11/27/15 0844 Last data filed at 11/27/15 0542  Gross per 24 hour  Intake  770 ml  Output              404 ml  Net              366 ml   Filed Weights   11/25/15 0606 11/26/15 0450 11/27/15 0617  Weight: 42.8 kg (94 lb 5.8 oz) 42.4 kg (93 lb 8 oz) 41.4 kg (91 lb 3.2 oz)    Examination:  General exam: Appears calm and comfortable  Respiratory system: Clear to auscultation. Respiratory effort normal. Cardiovascular system: S1 & S2 heard, RRR. No JVD, murmurs, rubs, gallops or clicks. No pedal edema. Gastrointestinal system: Abdomen is nondistended, soft and nontender. No organomegaly or masses felt. Normal bowel sounds heard. Central nervous system: Alert and oriented. No focal  neurological deficits. Extremities: Symmetric 5 x 5 power. Skin: No rashes, lesions or ulcers Psychiatry: Judgement and insight appear normal. Mood & affect appropriate.   CBC:  Recent Labs Lab 11/24/15 1013 11/25/15 0556 11/26/15 0613  WBC 12.9* 15.4* 10.3  NEUTROABS 11.7*  --   --   HGB 12.5 10.9* 10.5*  HCT 37.8 32.9* 32.4*  MCV 97.4 97.6 98.5  PLT 236 210 0000000   Basic Metabolic Panel:  Recent Labs Lab 11/24/15 1013 11/25/15 0556 11/27/15 0500  NA 137 135 139  K 3.4* 3.3* 3.9  CL 107 104 110  CO2 24 23 23   GLUCOSE 116* 107* 129*  BUN 14 14 17   CREATININE 0.96 0.71 0.77  CALCIUM 9.2 8.9 9.2  MG  --   --  1.8    Recent Labs Lab 11/24/15 1013  INR 0.92   Thyroid Function Tests:  Recent Labs  11/24/15 1013  TSH 1.094    Recent Results (from the past 240 hour(s))  Culture, blood (routine x 2)     Status: None (Preliminary result)   Collection Time: 11/24/15 12:29 PM  Result Value Ref Range Status   Specimen Description BLOOD LEFT ARM  Final   Special Requests BOTTLES DRAWN AEROBIC AND ANAEROBIC 8CC  Final   Culture NO GROWTH 2 DAYS  Final   Report Status PENDING  Incomplete  Culture, blood (routine x 2)     Status: None (Preliminary result)   Collection Time: 11/24/15 12:30 PM  Result Value Ref Range Status   Specimen Description BLOOD LEFT ARM  Final   Special Requests BOTTLES DRAWN AEROBIC AND ANAEROBIC 8CC  Final   Culture NO GROWTH 2 DAYS  Final   Report Status PENDING  Incomplete     Radiology Studies: US Venous Img Lower Unilateral Right  Result Date: 11/25/2015 CLINICAL DATA:  Acute on chronic cellulitis EXAM: RIGHT LOWER EXTREMITY VENOUS DOPPLER ULTRASOUND TECHNIQUE: Gray-scale sonography with compression, as well as color and duplex ultrasound, were performed to evaluate the deep venous system from the level of the common femoral vein through the popliteal and proximal calf veins. COMPARISON:  11/03/2015 FINDINGS: Normal compressibility of  the common femoral, superficial femoral, and popliteal veins, as well as the proximal calf veins. No filling defects to suggest DVT on grayscale or color Doppler imaging. Doppler waveforms show normal direction of venous flow, normal respiratory phasicity and response to augmentation. Visualized segments of the saphenous venous system normal in caliber and compressibility. Survey views of the contralateral common femoral vein are unremarkable. IMPRESSION: No evidence of  lower extremity deep vein thrombosis, right. Electronically Signed   By: Lucrezia Europe M.D.   On: 11/25/2015 10:51   US Arterial Seg Single  Result Date:  11/25/2015 CLINICAL DATA:  Bilateral toe ulcers. Right lower extremity cellulitis. EXAM: NONINVASIVE PHYSIOLOGIC VASCULAR STUDY OF BILATERAL LOWER EXTREMITIES TECHNIQUE: Evaluation of both lower extremities were performed at rest, including calculation of ankle-brachial indices with single level Doppler, pressure and pulse volume recording. COMPARISON:  None. FINDINGS: Right ABI:  1.46 Left ABI:  1.80 Right Lower Extremity: Normal triphasic Doppler waveform in the right posterior tibial artery. Left Lower Extremity: Triphasic Doppler waveforms in the left ankle. IMPRESSION: Ankle-brachial indices are markedly elevated. Findings suggest heavily calcified arteries. No significant abnormality involving the ankle waveforms. Electronically Signed   By: Markus Daft M.D.   On: 11/25/2015 10:26    Scheduled Meds: . atorvastatin  40 mg Oral q1800  . aztreonam  1 g Intravenous Q8H  . budesonide  6 mg Oral q morning - 10a  . darifenacin  7.5 mg Oral Daily  . enoxaparin (LOVENOX) injection  30 mg Subcutaneous Q24H  . feeding supplement (PRO-STAT SUGAR FREE 64)  30 mL Oral BID  . ferrous sulfate  325 mg Oral Q breakfast  . l-methylfolate-B6-B12  1 tablet Oral Daily  . levETIRAcetam  250 mg Oral BID  . megestrol  80 mg Oral Daily  . memantine  10 mg Oral Daily  . mometasone-formoterol  2 puff  Inhalation BID  . pantoprazole  40 mg Oral Daily  . tiotropium  1 capsule Inhalation Daily   Continuous Infusions:   LOS: 3 days   Josten Warmuth, MD FACP Hospitalist.   If 7PM-7AM, please contact night-coverage [email or web address] Password [facility] 11/27/2015, 8:44 AM

## 2015-11-27 NOTE — Care Management Important Message (Signed)
Important Message  Patient Details  Name: Bridget Ball MRN: [id number] Date of Birth: [birth year]   Medicare Important Message Given:  Yes    Sherald Barge, RN 11/27/2015, 12:22 PM

## 2015-11-27 NOTE — Patient Outreach (Signed)
[facility]) Care Management  11/27/2015  [name] 1928-11-05 [id number]  [name] with  past medical history of COPD, dementia, GERD, CVA, subdural bleed due to fall complicated with seizures, HLD and was recently admitted to the hospital with recurrent cellulitis of the LE. She is being treated with IV antibiotics and earliest anticipated discharge date is 11/28/15.   Bridget Ball was referred to [place] [facility] for transition of care assessments. I will follow her progress and reach out to her/her family by phone at discharge.    [place] [facility]  [phone]

## 2015-11-28 MED ORDER — SULFAMETHOXAZOLE-TRIMETHOPRIM 800-160 MG PO TABS: 1.0000 | ORAL_TABLET | Freq: Two times a day (BID) | ORAL | 0 refills | Status: DC

## 2015-11-28 MED ORDER — PRO-STAT SUGAR FREE PO LIQD: 30.0000 mL | Freq: Two times a day (BID) | ORAL | 0 refills | Status: DC

## 2015-11-28 NOTE — Progress Notes (Signed)
Discharged PT per MD order and protocol. Discharge handouts reviewed/explained. Education completed.  Pt verbalized understanding and left with all belongings. VSS. IV catheter D/C.  Prescriptions explained. Patient wheeled down by staff member.

## 2015-11-28 NOTE — Discharge Summary (Signed)
Physician Discharge Summary  KATALEENA LEBARON [id number] DOB: 01-Oct-1928 DOA: 11/24/2015  PCP: [name], MD  Admit date: 11/24/2015 Discharge date: 11/28/2015  Admitted From: Home.  Disposition:  Home.   Recommendations for Outpatient Follow-up:  1. Follow up with PCP in 1-2 weeks  Home Health: None.  Equipment/Devices: None.  Discharge Condition: Much improved.  No cellulitis. No fever, chills, able to ambulate.  CODE STATUS: DNR. Diet recommendation: As tolerated.   Brief/Interim Summary: Patient was admitted for recurrent cellulitis by Dr Candiss Norse on Nov 24, 2015.  As per his H and P:  " Bridget Ball  is a 82 y.o. female, with a past medical history significant for COPD, dementia, GERD, CVA, subdural bleed due to fall complicated with seizures,, HLD  and recurrent cellulitis of the [place], presented with complaints of erythema and warmness in the right lower extremity that onset two days ago. Patient was seen in the ED two weeks ago with right leg cellulitis. She denies anyitching and burning. He does have pain in the right leg which is constant and dull, nonradiating worse by bearing weight and better with rest and pain medicines, While in the ED, she was started on IV vancomycin and admitted for further evaluation of cellulitis of the right leg.    HOSPITAL COURSE:  Patient was originally started on IV Vancomycin, and her cellulitis was not improving.  She was taking Doxycycline prior to being admitted.  The area of cellulitis was demarcated, and Azactam was added, and her cellulitis improved immediately.  She has allergy to PCN as having anaphylaxis, and thus, she will be discharged on Bactrim for another 5 days.  She did have [place] which showed no DVT, and arterial study showed marked elevation of ABI, indicative of calcified arteries, but normal wave forms.  Side effect including rash, nausea, and vomiting discussed.  She will follow up with her PCP in one week.  She is stable for discharge  today.  Thank you and Good Day.   Discharge Diagnoses:  Principal Problem:   Cellulitis of leg, right Active Problems:   Seizures secondary to [facility]   Memory loss   Microscopic colitis   NSTEMI (non-ST elevated myocardial infarction) type 2   COPD (chronic obstructive pulmonary disease) (HCC)   Esophageal reflux   Cellulitis of right leg   Dyslipidemia   Cellulitis of right lower leg   Discharge Instructions:  Finish your antibiotics. Follow up with your PCP next week.   Discharge Instructions    AMB Referral to [facility]    Complete by:  As directed    Reason for consult:  post hospital discharge follow up   Diagnoses of:  COPD/ Pneumonia   Expected date of contact:  1-3 days (reserved for hospital discharges)   Please assign patient for community nurse to engage for transition of care calls and evaluate for monthly home visits. For questions please contact:   Janci Minor RN, [facility] Liaison [phone])   Ambulatory referral to Physical Therapy    Complete by:  As directed    Diet - low sodium heart healthy    Complete by:  As directed    Discharge instructions    Complete by:  As directed    Follow up with your PCP next week.  Finish your antibiotics.   Increase activity slowly    Complete by:  As directed        Medication List    STOP taking these medications  doxycycline 100 MG tablet Commonly known as:  VIBRA-TABS   loperamide 2 MG capsule Commonly known as:  IMODIUM   silver sulfADIAZINE 1 % cream Commonly known as:  SILVADENE   SYSTANE OP     TAKE these medications   albuterol 108 (90 Base) MCG/ACT inhaler Commonly known as:  PROVENTIL HFA;VENTOLIN HFA Inhale 2 puffs into the lungs every 6 (six) hours as needed for wheezing or shortness of breath.   atorvastatin 80 MG tablet Commonly known as:  LIPITOR Take 1 tablet (80 mg total) by mouth daily at 6 PM. What changed:  how much to take   budesonide 3 MG 24 hr  capsule Commonly known as:  ENTOCORT EC Take 6 mg by mouth every morning.   budesonide-formoterol 160-4.5 MCG/ACT inhaler Commonly known as:  SYMBICORT Inhale 2 puffs into the lungs 2 (two) times daily.   CEREFOLIN NAC 6-90.314-2-600 MG Tabs Take 1 tablet by mouth daily.   citalopram 10 MG tablet Commonly known as:  CELEXA TAKE 1 TABLET (10 MG TOTAL) BY MOUTH DAILY.   feeding supplement (PRO-STAT SUGAR FREE 64) Liqd Take 30 mLs by mouth 2 (two) times daily.   ferrous sulfate 325 (65 FE) MG EC tablet Take 325 mg by mouth daily with breakfast.   levETIRAcetam 250 MG tablet Commonly known as:  KEPPRA Take 1 tablet (250 mg total) by mouth 2 (two) times daily.   megestrol 40 MG tablet Commonly known as:  MEGACE Take 80 mg by mouth daily.   memantine 5 MG tablet Commonly known as:  NAMENDA SIG:  Take one tablet daily for one week  then: take one tablet in AM and one tablet in PM for one week.  then: take one tablet in AM and two tablets in PM for one week.  then: take two tablets in AM and two tablets in PM.   pantoprazole 40 MG tablet Commonly known as:  PROTONIX TAKE ONE TABLET ONCE DAILY WITH BREAKFAST   solifenacin 5 MG tablet Commonly known as:  VESICARE Take 5 mg by mouth daily.   SPIRIVA HANDIHALER 18 MCG inhalation capsule Generic drug:  tiotropium Place 1 capsule into inhaler and inhale daily.   sulfamethoxazole-trimethoprim 800-160 MG tablet Commonly known as:  BACTRIM DS,SEPTRA DS Take 1 tablet by mouth 2 (two) times daily.       Consultations:  None.   Procedures/Studies: Dg Chest 2 View  Result Date: 11/08/2015 CLINICAL DATA:  Shortness of breath. EXAM: CHEST  2 VIEW COMPARISON:  06/12/2014. FINDINGS: Mediastinum and hilar structures normal. Lungs are clear. COPD. Heart size normal. Basilar pleural thickening noted consistent with scarring. No pneumothorax . IMPRESSION: COPD. Bilateral pleural-parenchymal scarring. No acute abnormality. Electronically  Signed   By: Marcello Moores  Register   On: 11/08/2015 14:40   Dg Tibia/fibula Right  Result Date: 11/01/2015 CLINICAL DATA:  Chronic wound from a fall.  Swelling and pain EXAM: RIGHT TIBIA AND FIBULA - 2 VIEW COMPARISON:  None. FINDINGS: No acute fracture, malalignment, periostitis, or bone destruction. Moderate diffuse soft tissue swelling. Nonspecific soft tissue calcifications posterior calf. Vascular calcifications. Probable phleboliths distal posterior soft tissues. Bones appear osteopenic. Moderate narrowing and spurring of the medial joint space compartment. Mild narrowing and spurring of the patellofemoral and lateral compartments. Possible faint joint space calcification suggests chondrocalcinosis. IMPRESSION: 1. No fracture or suspicious bone lesion identified. 2. Moderate degenerative changes 3. Suspect chondrocalcinosis. 4. Vascular calcification Electronically Signed   By: Donavan Foil M.D.   On: 11/01/2015 19:20  US Renal  Result Date: 11/06/2015 CLINICAL DATA:  Hydronephrosis EXAM: RENAL / URINARY TRACT ULTRASOUND COMPLETE COMPARISON:  6/20/ 17 FINDINGS: Right Kidney: Length: 10 cm. Bridget is normal echogenicity. Bridget is mild dilatation of right renal pelvis and proximal right ureter. No frank hydronephrosis is noted. No renal calculi. Left Kidney: Length: 9.3 cm. Mild left hydronephrosis. Moderate dilatation of left renal pelvis up to 8.2 mm. Bladder: Appears normal for degree of bladder distention. Partially visualized ureteral stent. Prevoid bladder measures 118 cc. Postvoid the bladder measures 55 cc. IMPRESSION: 1. Mild dilatation of right renal pelvis without frank hydronephrosis. 2. Bridget is mild left hydronephrosis. Moderate dilatation of left renal pelvis. 3. Partially visualized ureteral stent within bladder. Electronically Signed   By: Lahoma Crocker M.D.   On: 11/06/2015 17:05   US Venous Img Lower Unilateral Right  Result Date: 11/25/2015 CLINICAL DATA:  Acute on chronic cellulitis  EXAM: RIGHT LOWER EXTREMITY VENOUS DOPPLER ULTRASOUND TECHNIQUE: Gray-scale sonography with compression, as well as color and duplex ultrasound, were performed to evaluate the deep venous system from the level of the common femoral vein through the popliteal and proximal calf veins. COMPARISON:  11/03/2015 FINDINGS: Normal compressibility of the common femoral, superficial femoral, and popliteal veins, as well as the proximal calf veins. No filling defects to suggest DVT on grayscale or color Doppler imaging. Doppler waveforms show normal direction of venous flow, normal respiratory phasicity and response to augmentation. Visualized segments of the saphenous venous system normal in caliber and compressibility. Survey views of the contralateral common femoral vein are unremarkable. IMPRESSION: No evidence of  lower extremity deep vein thrombosis, right. Electronically Signed   By: Lucrezia Europe M.D.   On: 11/25/2015 10:51   US Venous Img Lower Unilateral Right  Result Date: 11/03/2015 CLINICAL DATA:  Right lower extremity swelling with cellulitis EXAM: RIGHT LOWER EXTREMITY VENOUS DUPLEX ULTRASOUND TECHNIQUE: Doppler venous assessment of the right lower extremity deep venous system was performed, including characterization of spectral flow, compressibility, and phasicity. COMPARISON:  None. FINDINGS: Bridget is complete compressibility of the right common femoral, femoral, and popliteal veins. Doppler analysis demonstrates respiratory phasicity and augmentation of flow with calf compression. No obvious superficial vein or calf vein thrombosis. IMPRESSION: No evidence of right lower extremity DVT. Electronically Signed   By: Marybelle Killings M.D.   On: 11/03/2015 13:06   US Arterial Seg Single  Result Date: 11/25/2015 CLINICAL DATA:  Bilateral toe ulcers. Right lower extremity cellulitis. EXAM: NONINVASIVE PHYSIOLOGIC VASCULAR STUDY OF BILATERAL LOWER EXTREMITIES TECHNIQUE: Evaluation of both lower extremities were  performed at rest, including calculation of ankle-brachial indices with single level Doppler, pressure and pulse volume recording. COMPARISON:  None. FINDINGS: Right ABI:  1.46 Left ABI:  1.80 Right Lower Extremity: Normal triphasic Doppler waveform in the right posterior tibial artery. Left Lower Extremity: Triphasic Doppler waveforms in the left ankle. IMPRESSION: Ankle-brachial indices are markedly elevated. Findings suggest heavily calcified arteries. No significant abnormality involving the ankle waveforms. Electronically Signed   By: Markus Daft M.D.   On: 11/25/2015 10:26     Subjective: I am ready to go home.   Discharge Exam: Vitals:   11/27/15 2150 11/28/15 0511  BP: 124/77 118/65  Pulse: 84 (!) 101  Resp: 18 20  Temp: 98.2 F (36.8 C) 98.1 F (36.7 C)   Vitals:   11/27/15 2048 11/27/15 2150 11/28/15 0511 11/28/15 0729  BP:  124/77 118/65   Pulse: 91 84 (!) 101   Resp: 18 18 20  Temp:  98.2 F (36.8 C) 98.1 F (36.7 C)   TempSrc:  Oral Oral   SpO2: 97% 97% 99% 97%  Weight:   42 kg (92 lb 9.6 oz)   Height:        General: Pt is alert, awake, not in acute distress Cardiovascular: RRR, S1/S2 +, no rubs, no gallops Respiratory: CTA bilaterally, no wheezing, no rhonchi Abdominal: Soft, NT, ND, bowel sounds + Extremities: no edema, no cyanosis    The results of significant diagnostics from this hospitalization (including imaging, microbiology, ancillary and laboratory) are listed below for reference.     Microbiology: Recent Results (from the past 240 hour(s))  Culture, blood (routine x 2)     Status: None (Preliminary result)   Collection Time: 11/24/15 12:29 PM  Result Value Ref Range Status   Specimen Description BLOOD LEFT ARM  Final   Special Requests BOTTLES DRAWN AEROBIC AND ANAEROBIC 8CC  Final   Culture NO GROWTH 4 DAYS  Final   Report Status PENDING  Incomplete  Culture, blood (routine x 2)     Status: None (Preliminary result)   Collection Time:  11/24/15 12:30 PM  Result Value Ref Range Status   Specimen Description BLOOD LEFT ARM  Final   Special Requests BOTTLES DRAWN AEROBIC AND ANAEROBIC 8CC  Final   Culture NO GROWTH 4 DAYS  Final   Report Status PENDING  Incomplete     Labs: BNP (last 3 results)  Recent Labs  11/08/15 1331  BNP 0000000   Basic Metabolic Panel:  Recent Labs Lab 11/24/15 1013 11/25/15 0556 11/27/15 0500  NA 137 135 139  K 3.4* 3.3* 3.9  CL 107 104 110  CO2 24 23 23   GLUCOSE 116* 107* 129*  BUN 14 14 17   CREATININE 0.96 0.71 0.77  CALCIUM 9.2 8.9 9.2  MG  --   --  1.8    Recent Labs Lab 11/24/15 1013 11/25/15 0556 11/26/15 0613  WBC 12.9* 15.4* 10.3  NEUTROABS 11.7*  --   --   HGB 12.5 10.9* 10.5*  HCT 37.8 32.9* 32.4*  MCV 97.4 97.6 98.5  PLT 236 210 204   Urinalysis    Component Value Date/Time   COLORURINE YELLOW 11/08/2015 1331   APPEARANCEUR CLEAR 11/08/2015 1331   LABSPEC 1.015 11/08/2015 1331   PHURINE 6.0 11/08/2015 1331   GLUCOSEU NEGATIVE 11/08/2015 1331   HGBUR NEGATIVE 11/08/2015 1331   BILIRUBINUR NEGATIVE 11/08/2015 1331   KETONESUR NEGATIVE 11/08/2015 1331   PROTEINUR NEGATIVE 11/08/2015 1331   UROBILINOGEN 0.2 07/26/2013 2234   NITRITE NEGATIVE 11/08/2015 1331   LEUKOCYTESUR SMALL (A) 11/08/2015 1331   Microbiology Recent Results (from the past 240 hour(s))  Culture, blood (routine x 2)     Status: None (Preliminary result)   Collection Time: 11/24/15 12:29 PM  Result Value Ref Range Status   Specimen Description BLOOD LEFT ARM  Final   Special Requests BOTTLES DRAWN AEROBIC AND ANAEROBIC 8CC  Final   Culture NO GROWTH 4 DAYS  Final   Report Status PENDING  Incomplete  Culture, blood (routine x 2)     Status: None (Preliminary result)   Collection Time: 11/24/15 12:30 PM  Result Value Ref Range Status   Specimen Description BLOOD LEFT ARM  Final   Special Requests BOTTLES DRAWN AEROBIC AND ANAEROBIC 8CC  Final   Culture NO GROWTH 4 DAYS  Final    Report Status PENDING  Incomplete    Time coordinating discharge: Over 30 minutes  SIGNEDOrvan Falconer, MD FACP [facility] 11/28/2015, 11:32 AM   If 7PM-7AM, please contact night-coverage [email or web address] Password [email or web address]

## 2015-11-28 NOTE — Discharge Instructions (Signed)
Cellulitis, Adult °Introduction °Cellulitis is a skin infection. The infected area is usually red and sore. This condition occurs most often in the arms and lower legs. It is very important to get treated for this condition. °Follow these instructions at home: °· Take over-the-counter and prescription medicines only as told by your doctor. °· If you were prescribed an antibiotic medicine, take it as told by your doctor. Do not stop taking the antibiotic even if you start to feel better. °· Drink enough fluid to keep your pee (urine) clear or pale yellow. °· Do not touch or rub the infected area. °· Raise (elevate) the infected area above the level of your heart while you are sitting or lying down. °· Place warm or cold wet cloths (warm or cold compresses) on the infected area. Do this as told by your doctor. °· Keep all follow-up visits as told by your doctor. This is important. These visits let your doctor make sure your infection is not getting worse. °Contact a doctor if: °· You have a fever. °· Your symptoms do not get better after 1-2 days of treatment. °· Your bone or joint under the infected area starts to hurt after the skin has healed. °· Your infection comes back. This can happen in the same area or another area. °· You have a swollen bump in the infected area. °· You have new symptoms. °· You feel ill and also have muscle aches and pains. °Get help right away if: °· Your symptoms get worse. °· You feel very sleepy. °· You throw up (vomit) or have watery poop (diarrhea) for a long time. °· There are red streaks coming from the infected area. °· Your red area gets larger. °· Your red area turns darker. °This information is not intended to replace advice given to you by your health care provider. Make sure you discuss any questions you have with your health care provider. °Document Released: 06/10/2007 Document Revised: 05/30/2015 Document Reviewed: 10/31/2014 °© 2017 Elsevier ° °

## 2015-11-29 ENCOUNTER — Other Ambulatory Visit: Payer: Self-pay | Admitting: *Deleted

## 2015-11-29 NOTE — Patient Outreach (Signed)
[facility]) Care Management  11/29/2015  ANZLEIGH Ball 1928/03/15 [id number]  I reached out by phone today to [name], dtr of [name] for transition of care assessment and post hospital discharge follow up. I was able to reach [name] who told me she was on the way out of town and that her sister, [name] was providing care for [name] at home. She confirmed [name] contact information and asked me to call her. I reached out to [name] but did not get an answer. I left a HIPPA compliant message requesting a return call.   Plan: If I do not hear from [name] today, I will reach out to her again on Monday.   [place] Management  [phone]

## 2015-11-30 LAB — CULTURE, BLOOD (ROUTINE X 2)
CULTURE: NO GROWTH
Culture: NO GROWTH

## 2015-12-02 ENCOUNTER — Encounter: Payer: Self-pay | Admitting: *Deleted

## 2015-12-02 ENCOUNTER — Other Ambulatory Visit: Payer: Self-pay | Admitting: *Deleted

## 2015-12-02 ENCOUNTER — Other Ambulatory Visit: Payer: Self-pay

## 2015-12-02 MED ORDER — CEREFOLIN NAC 6-90.314-2-600 MG PO TABS: 1.0000 | ORAL_TABLET | Freq: Every day | ORAL | 3 refills | Status: DC

## 2015-12-02 NOTE — Telephone Encounter (Signed)
Cerefolin was sent to wrong pharmacy. PT [facility] meds [facility]. Med [facility] to [facility].

## 2015-12-02 NOTE — Telephone Encounter (Signed)
Bridget Ball/[facility] called to advise Methylfol-Algae-B12-Acetylcyst (CEREFOLIN NAC) 6-90.314-2-600 MG TABS is not covered under pt's insurance.

## 2015-12-02 NOTE — Patient Outreach (Addendum)
[facility]) Care Management  12/02/2015  Bridget Ball 05-Oct-1928 [id number]  Bridget Ball a 82 y.o.female with  past medical history of COPD, dementia, GERD, CVA, subdural bleed due to fall complicated with seizures, HLD and was recently admitted to the hospital with recurrent cellulitis of the LE. She is being treated with IV antibiotics and earliest anticipated discharge date is 11/28/15.   Bridget Ball was referred to [place] [facility] for transition of care assessments. I was unable to reach Bridget Ball or her daughter Bridget Ball who has been providing care since Bridget Ball discharge from the hospital on 11/23. However, I was able to reach Bridget Ball today. Bridget Ball is staying in Bridget Ball this week. Bridget Ball is providing round the clock care and is doing daily dressing changes to Bridget Ball' leg (Xeroform gauze). Bridget Ball says Bridget Ball leg looks "oh so much better". She says she feels her mom is weak but is making progress. Patient was recently discharged from hospital and all medications have been reviewed. Bridget Ball does not have a follow up appointment scheduled with  Bridget Ball. I have sent a request to Dr. Juel Burrow office to see if someone will outreach to Bridget Ball.   Plan: In my absence next week, Bridget Lair NP will follow up with Bridget Ball and her daughter for ongoing transition of care assessments. I will reach out to Bridget Ball upon my return the week of 12/16/15.   [facility] CM Care Plan Problem One   Flowsheet Row Most Recent Value  Care Plan Problem One  Acute Health Condition (Cellulitis)  Role Documenting the Problem One  Care Management Coordinator  Care Plan for Problem One  Active  THN Long Term Goal (31-90 days)  Over the next 31 days, patient and/or caregivers/daughters will verbalize understanding of plan of care for treatment of cellulitis  Interventions for Problem One Long Term Goal  Reviewed discharge plan and  instructions with dtr,  discussed plan of care for management and post hospital care for cellulitis  THN CM Short Term Goal #1 (0-30 days)  Over the next 30 days, patient will take all prescribed medications  Interventions for Short Term Goal #1  Medicaitons reviewed with daughter  THN CM Short Term Goal #2 (0-30 days)  Over the next 14 days, patient will attend PCP follow up appointment as scheduled  THN CM Short Term Goal #2 Start Date  12/02/15  Interventions for Short Term Goal #2  reached out to PCP to request appointment  THN CM Short Term Goal #3 (0-30 days)  Over the next 30 days, patient/caregivers will perform skin care as prescribed  THN CM Short Term Goal #3 Start Date  12/02/15  Interventions for Short Tern Goal #3  reviewed prescribed dressing instructions with daughter/primary caregiver        [place] [facility]  [phone]

## 2015-12-13 ENCOUNTER — Other Ambulatory Visit: Payer: Self-pay | Admitting: *Deleted

## 2015-12-13 ENCOUNTER — Encounter (HOSPITAL_COMMUNITY): Payer: Self-pay

## 2015-12-13 ENCOUNTER — Inpatient Hospital Stay (HOSPITAL_COMMUNITY): Payer: Medicare Other

## 2015-12-13 ENCOUNTER — Inpatient Hospital Stay (HOSPITAL_COMMUNITY)
Admission: EM | Admit: 2015-12-13 | Discharge: 2015-12-21 | DRG: 871 | Disposition: A | Discharge status: Home / Self Care | Payer: Medicare Other | Attending: Internal Medicine | Admitting: Internal Medicine

## 2015-12-13 DIAGNOSIS — E43 Unspecified severe protein-calorie malnutrition: Secondary | ICD-10-CM | POA: Diagnosis not present

## 2015-12-13 DIAGNOSIS — D649 Anemia, unspecified: Secondary | ICD-10-CM | POA: Diagnosis present

## 2015-12-13 DIAGNOSIS — A419 Sepsis, unspecified organism: Secondary | ICD-10-CM | POA: Diagnosis not present

## 2015-12-13 DIAGNOSIS — Z79899 Other long term (current) drug therapy: Secondary | ICD-10-CM

## 2015-12-13 DIAGNOSIS — Z8673 Personal history of transient ischemic attack (TIA), and cerebral infarction without residual deficits: Secondary | ICD-10-CM

## 2015-12-13 DIAGNOSIS — I82441 Acute embolism and thrombosis of right tibial vein: Secondary | ICD-10-CM | POA: Diagnosis present

## 2015-12-13 DIAGNOSIS — L03115 Cellulitis of right lower limb: Secondary | ICD-10-CM | POA: Diagnosis not present

## 2015-12-13 DIAGNOSIS — I252 Old myocardial infarction: Secondary | ICD-10-CM | POA: Diagnosis not present

## 2015-12-13 DIAGNOSIS — J42 Unspecified chronic bronchitis: Secondary | ICD-10-CM | POA: Diagnosis not present

## 2015-12-13 DIAGNOSIS — Z833 Family history of diabetes mellitus: Secondary | ICD-10-CM | POA: Diagnosis not present

## 2015-12-13 DIAGNOSIS — E860 Dehydration: Secondary | ICD-10-CM | POA: Diagnosis not present

## 2015-12-13 DIAGNOSIS — F05 Delirium due to known physiological condition: Secondary | ICD-10-CM | POA: Diagnosis not present

## 2015-12-13 DIAGNOSIS — I82431 Acute embolism and thrombosis of right popliteal vein: Secondary | ICD-10-CM | POA: Diagnosis present

## 2015-12-13 DIAGNOSIS — I872 Venous insufficiency (chronic) (peripheral): Secondary | ICD-10-CM | POA: Diagnosis not present

## 2015-12-13 DIAGNOSIS — F419 Anxiety disorder, unspecified: Secondary | ICD-10-CM | POA: Diagnosis present

## 2015-12-13 DIAGNOSIS — N390 Urinary tract infection, site not specified: Secondary | ICD-10-CM | POA: Diagnosis not present

## 2015-12-13 DIAGNOSIS — Z01810 Encounter for preprocedural cardiovascular examination: Secondary | ICD-10-CM | POA: Diagnosis not present

## 2015-12-13 DIAGNOSIS — I723 Aneurysm of iliac artery: Secondary | ICD-10-CM | POA: Diagnosis not present

## 2015-12-13 DIAGNOSIS — J449 Chronic obstructive pulmonary disease, unspecified: Secondary | ICD-10-CM | POA: Diagnosis present

## 2015-12-13 DIAGNOSIS — I82439 Acute embolism and thrombosis of unspecified popliteal vein: Secondary | ICD-10-CM | POA: Diagnosis present

## 2015-12-13 DIAGNOSIS — K219 Gastro-esophageal reflux disease without esophagitis: Secondary | ICD-10-CM | POA: Diagnosis not present

## 2015-12-13 DIAGNOSIS — B9689 Other specified bacterial agents as the cause of diseases classified elsewhere: Secondary | ICD-10-CM | POA: Diagnosis not present

## 2015-12-13 DIAGNOSIS — L03116 Cellulitis of left lower limb: Secondary | ICD-10-CM | POA: Diagnosis not present

## 2015-12-13 DIAGNOSIS — Z85828 Personal history of other malignant neoplasm of skin: Secondary | ICD-10-CM

## 2015-12-13 DIAGNOSIS — D500 Iron deficiency anemia secondary to blood loss (chronic): Secondary | ICD-10-CM | POA: Diagnosis not present

## 2015-12-13 DIAGNOSIS — F329 Major depressive disorder, single episode, unspecified: Secondary | ICD-10-CM | POA: Diagnosis present

## 2015-12-13 DIAGNOSIS — R569 Unspecified convulsions: Secondary | ICD-10-CM

## 2015-12-13 DIAGNOSIS — Z880 Allergy status to penicillin: Secondary | ICD-10-CM | POA: Diagnosis not present

## 2015-12-13 DIAGNOSIS — Z87891 Personal history of nicotine dependence: Secondary | ICD-10-CM | POA: Diagnosis not present

## 2015-12-13 DIAGNOSIS — E785 Hyperlipidemia, unspecified: Secondary | ICD-10-CM | POA: Diagnosis not present

## 2015-12-13 DIAGNOSIS — N179 Acute kidney failure, unspecified: Secondary | ICD-10-CM | POA: Diagnosis not present

## 2015-12-13 DIAGNOSIS — L539 Erythematous condition, unspecified: Secondary | ICD-10-CM | POA: Diagnosis not present

## 2015-12-13 DIAGNOSIS — F0390 Unspecified dementia without behavioral disturbance: Secondary | ICD-10-CM | POA: Diagnosis present

## 2015-12-13 DIAGNOSIS — E440 Moderate protein-calorie malnutrition: Secondary | ICD-10-CM | POA: Insufficient documentation

## 2015-12-13 DIAGNOSIS — Z66 Do not resuscitate: Secondary | ICD-10-CM | POA: Diagnosis present

## 2015-12-13 DIAGNOSIS — J441 Chronic obstructive pulmonary disease with (acute) exacerbation: Secondary | ICD-10-CM | POA: Diagnosis not present

## 2015-12-13 DIAGNOSIS — B9620 Unspecified Escherichia coli [E. coli] as the cause of diseases classified elsewhere: Secondary | ICD-10-CM | POA: Diagnosis present

## 2015-12-13 DIAGNOSIS — G40909 Epilepsy, unspecified, not intractable, without status epilepticus: Secondary | ICD-10-CM | POA: Diagnosis present

## 2015-12-13 DIAGNOSIS — Z6820 Body mass index (BMI) 20.0-20.9, adult: Secondary | ICD-10-CM

## 2015-12-13 DIAGNOSIS — I724 Aneurysm of artery of lower extremity: Secondary | ICD-10-CM | POA: Diagnosis not present

## 2015-12-13 HISTORY — DX: Disorder of kidney and ureter, unspecified: N28.9

## 2015-12-13 LAB — CBC WITH DIFFERENTIAL/PLATELET
BASOS PCT: 0 %
Basophils Absolute: 0 10*3/uL (ref 0.0–0.1)
EOS ABS: 0 10*3/uL (ref 0.0–0.7)
EOS PCT: 0 %
HCT: 37.8 % (ref 36.0–46.0)
HEMOGLOBIN: 12.3 g/dL (ref 12.0–15.0)
LYMPHS ABS: 0.4 10*3/uL — AB (ref 0.7–4.0)
Lymphocytes Relative: 3 %
MCH: 31.5 pg (ref 26.0–34.0)
MCHC: 32.5 g/dL (ref 30.0–36.0)
MCV: 96.9 fL (ref 78.0–100.0)
MONO ABS: 0.3 10*3/uL (ref 0.1–1.0)
MONOS PCT: 3 %
NEUTROS PCT: 94 %
Neutro Abs: 12.6 10*3/uL — ABNORMAL HIGH (ref 1.7–7.7)
PLATELETS: 287 10*3/uL (ref 150–400)
RBC: 3.9 MIL/uL (ref 3.87–5.11)
RDW: 15.8 % — AB (ref 11.5–15.5)
WBC: 13.3 10*3/uL — ABNORMAL HIGH (ref 4.0–10.5)

## 2015-12-13 LAB — URINALYSIS, ROUTINE W REFLEX MICROSCOPIC
Bilirubin Urine: NEGATIVE
GLUCOSE, UA: NEGATIVE mg/dL
KETONES UR: NEGATIVE mg/dL
NITRITE: POSITIVE — AB
PH: 5 (ref 5.0–8.0)
Protein, ur: NEGATIVE mg/dL
SPECIFIC GRAVITY, URINE: 1.028 (ref 1.005–1.030)

## 2015-12-13 LAB — COMPREHENSIVE METABOLIC PANEL
ALK PHOS: 40 U/L (ref 38–126)
ALT: 20 U/L (ref 14–54)
AST: 24 U/L (ref 15–41)
Albumin: 3.5 g/dL (ref 3.5–5.0)
Anion gap: 9 (ref 5–15)
BUN: 13 mg/dL (ref 6–20)
CALCIUM: 9.2 mg/dL (ref 8.9–10.3)
CHLORIDE: 102 mmol/L (ref 101–111)
CO2: 24 mmol/L (ref 22–32)
CREATININE: 1.22 mg/dL — AB (ref 0.44–1.00)
GFR, EST AFRICAN AMERICAN: 45 mL/min — AB (ref >60)
GFR, EST NON AFRICAN AMERICAN: 39 mL/min — AB (ref >60)
Glucose, Bld: 113 mg/dL — ABNORMAL HIGH (ref 65–99)
Potassium: 3.4 mmol/L — ABNORMAL LOW (ref 3.5–5.1)
SODIUM: 135 mmol/L (ref 135–145)
Total Bilirubin: 1.2 mg/dL (ref 0.3–1.2)
Total Protein: 6 g/dL — ABNORMAL LOW (ref 6.5–8.1)

## 2015-12-13 LAB — APTT: APTT: 31 s (ref 24–36)

## 2015-12-13 LAB — CORTISOL: Cortisol, Plasma: 20.7 ug/dL

## 2015-12-13 LAB — SEDIMENTATION RATE: Sed Rate: 25 mm/hr — ABNORMAL HIGH (ref 0–22)

## 2015-12-13 LAB — PROTIME-INR
INR: 1.25
PROTHROMBIN TIME: 15.8 s — AB (ref 11.4–15.2)

## 2015-12-13 LAB — I-STAT CG4 LACTIC ACID, ED
Lactic Acid, Venous: 2.39 mmol/L (ref 0.5–1.9)
Lactic Acid, Venous: 2.02 mmol/L (ref 0.5–1.9)

## 2015-12-13 LAB — PHOSPHORUS: Phosphorus: 3.2 mg/dL (ref 2.5–4.6)

## 2015-12-13 LAB — PROCALCITONIN: Procalcitonin: 6.87 ng/mL

## 2015-12-13 LAB — MAGNESIUM: MAGNESIUM: 1.5 mg/dL — AB (ref 1.7–2.4)

## 2015-12-13 LAB — GLUCOSE, CAPILLARY: GLUCOSE-CAPILLARY: 104 mg/dL — AB (ref 65–99)

## 2015-12-13 LAB — C-REACTIVE PROTEIN: CRP: 8.1 mg/dL — ABNORMAL HIGH (ref <1.0)

## 2015-12-13 LAB — MRSA PCR SCREENING: MRSA by PCR: NEGATIVE

## 2015-12-13 MED ORDER — SODIUM CHLORIDE 0.9% FLUSH
3.0000 mL | Freq: Two times a day (BID) | INTRAVENOUS | Status: DC
  Administered 2015-12-13 – 2015-12-21 (×9): 3 mL via INTRAVENOUS

## 2015-12-13 MED ORDER — HEPARIN (PORCINE) IN NACL 100-0.45 UNIT/ML-% IJ SOLN
1150.0000 [IU]/h | INTRAMUSCULAR | Status: DC
  Administered 2015-12-13: 750 [IU]/h via INTRAVENOUS
  Administered 2015-12-14: 1100 [IU]/h via INTRAVENOUS
  Administered 2015-12-15 – 2015-12-21 (×6): 1250 [IU]/h via INTRAVENOUS
  Filled 2015-12-13 (×10): qty 250

## 2015-12-13 MED ORDER — AZTREONAM 1 G IJ SOLR
1.0000 g | Freq: Once | INTRAMUSCULAR | Status: AC
  Administered 2015-12-13: 1 g via INTRAVENOUS
  Filled 2015-12-13: qty 1

## 2015-12-13 MED ORDER — DARIFENACIN HYDROBROMIDE ER 7.5 MG PO TB24
7.5000 mg | ORAL_TABLET | Freq: Every day | ORAL | Status: DC
  Administered 2015-12-14 – 2015-12-21 (×8): 7.5 mg via ORAL
  Filled 2015-12-13 (×8): qty 1

## 2015-12-13 MED ORDER — DEXTROSE 5 % IV SOLN: 2.0000 g | Freq: Once | INTRAVENOUS | Status: DC

## 2015-12-13 MED ORDER — ENOXAPARIN SODIUM 40 MG/0.4ML ~~LOC~~ SOLN: 40.0000 mg | SUBCUTANEOUS | Status: DC

## 2015-12-13 MED ORDER — SODIUM CHLORIDE 0.9 % IV BOLUS (SEPSIS): 500.0000 mL | Freq: Once | INTRAVENOUS | Status: DC

## 2015-12-13 MED ORDER — ACETAMINOPHEN 650 MG RE SUPP
650.0000 mg | Freq: Four times a day (QID) | RECTAL | Status: DC | PRN
  Administered 2015-12-13: 650 mg via RECTAL
  Filled 2015-12-13: qty 1

## 2015-12-13 MED ORDER — BUDESONIDE 3 MG PO CPEP
6.0000 mg | ORAL_CAPSULE | Freq: Every morning | ORAL | Status: DC
  Administered 2015-12-14 – 2015-12-21 (×8): 6 mg via ORAL
  Filled 2015-12-13 (×8): qty 2

## 2015-12-13 MED ORDER — POTASSIUM CHLORIDE CRYS ER 20 MEQ PO TBCR
40.0000 meq | EXTENDED_RELEASE_TABLET | Freq: Once | ORAL | Status: AC
  Administered 2015-12-13: 40 meq via ORAL
  Filled 2015-12-13: qty 2

## 2015-12-13 MED ORDER — HEPARIN BOLUS VIA INFUSION
2500.0000 [IU] | Freq: Once | INTRAVENOUS | Status: AC
  Administered 2015-12-13: 2500 [IU] via INTRAVENOUS
  Filled 2015-12-13: qty 2500

## 2015-12-13 MED ORDER — DEXTROSE 5 % IV SOLN
1.0000 g | Freq: Three times a day (TID) | INTRAVENOUS | Status: DC
  Filled 2015-12-13 (×2): qty 1

## 2015-12-13 MED ORDER — TIOTROPIUM BROMIDE MONOHYDRATE 18 MCG IN CAPS
1.0000 | ORAL_CAPSULE | Freq: Every day | RESPIRATORY_TRACT | Status: DC
  Administered 2015-12-14 – 2015-12-21 (×5): 18 ug via RESPIRATORY_TRACT
  Filled 2015-12-13 (×2): qty 5

## 2015-12-13 MED ORDER — HYDROMORPHONE HCL 2 MG/ML IJ SOLN
0.3000 mg | Freq: Once | INTRAMUSCULAR | Status: DC
  Filled 2015-12-13: qty 1

## 2015-12-13 MED ORDER — ACETAMINOPHEN 325 MG PO TABS
650.0000 mg | ORAL_TABLET | Freq: Four times a day (QID) | ORAL | Status: DC | PRN
  Administered 2015-12-18 – 2015-12-20 (×3): 650 mg via ORAL
  Filled 2015-12-13 (×3): qty 2

## 2015-12-13 MED ORDER — CITALOPRAM HYDROBROMIDE 10 MG PO TABS
10.0000 mg | ORAL_TABLET | Freq: Every day | ORAL | Status: DC
  Administered 2015-12-14 – 2015-12-21 (×8): 10 mg via ORAL
  Filled 2015-12-13 (×8): qty 1

## 2015-12-13 MED ORDER — ALBUTEROL SULFATE (2.5 MG/3ML) 0.083% IN NEBU
2.5000 mg | INHALATION_SOLUTION | Freq: Four times a day (QID) | RESPIRATORY_TRACT | Status: DC | PRN
  Administered 2015-12-20: 2.5 mg via RESPIRATORY_TRACT

## 2015-12-13 MED ORDER — FERROUS SULFATE 325 (65 FE) MG PO TABS
325.0000 mg | ORAL_TABLET | Freq: Every day | ORAL | Status: DC
  Administered 2015-12-14 – 2015-12-21 (×8): 325 mg via ORAL
  Filled 2015-12-13 (×8): qty 1

## 2015-12-13 MED ORDER — SODIUM CHLORIDE 0.9 % IV BOLUS (SEPSIS)
1000.0000 mL | Freq: Once | INTRAVENOUS | Status: AC
  Administered 2015-12-13: 1000 mL via INTRAVENOUS

## 2015-12-13 MED ORDER — NITROGLYCERIN 2 % TD OINT: 1.0000 [in_us] | TOPICAL_OINTMENT | Freq: Once | TRANSDERMAL | Status: DC

## 2015-12-13 MED ORDER — SODIUM CHLORIDE 0.9 % IV SOLN
INTRAVENOUS | Status: DC
  Administered 2015-12-13 – 2015-12-14 (×2): via INTRAVENOUS
  Administered 2015-12-14: 125 mL/h via INTRAVENOUS

## 2015-12-13 MED ORDER — SODIUM CHLORIDE 0.9 % IV BOLUS (SEPSIS)
500.0000 mL | Freq: Once | INTRAVENOUS | Status: AC
  Administered 2015-12-13: 500 mL via INTRAVENOUS

## 2015-12-13 MED ORDER — IOPAMIDOL (ISOVUE-300) INJECTION 61%
INTRAVENOUS | Status: AC
  Administered 2015-12-13: 75 mL
  Filled 2015-12-13: qty 75

## 2015-12-13 MED ORDER — FENTANYL CITRATE (PF) 100 MCG/2ML IJ SOLN
25.0000 ug | Freq: Once | INTRAMUSCULAR | Status: AC
  Administered 2015-12-13: 25 ug via INTRAVENOUS
  Filled 2015-12-13: qty 2

## 2015-12-13 MED ORDER — METRONIDAZOLE IN NACL 5-0.79 MG/ML-% IV SOLN
500.0000 mg | Freq: Three times a day (TID) | INTRAVENOUS | Status: DC
  Administered 2015-12-13 – 2015-12-14 (×3): 500 mg via INTRAVENOUS
  Filled 2015-12-13 (×6): qty 100

## 2015-12-13 MED ORDER — VANCOMYCIN HCL IN DEXTROSE 1-5 GM/200ML-% IV SOLN
1000.0000 mg | INTRAVENOUS | Status: DC
  Administered 2015-12-15: 1000 mg via INTRAVENOUS
  Filled 2015-12-13: qty 200

## 2015-12-13 MED ORDER — MEMANTINE HCL 10 MG PO TABS
10.0000 mg | ORAL_TABLET | Freq: Two times a day (BID) | ORAL | Status: DC
  Administered 2015-12-13 – 2015-12-21 (×16): 10 mg via ORAL
  Filled 2015-12-13 (×17): qty 1

## 2015-12-13 MED ORDER — HYDROMORPHONE HCL 2 MG/ML IJ SOLN: 1.0000 mg | Freq: Once | INTRAMUSCULAR | Status: DC

## 2015-12-13 MED ORDER — DEXTROSE 5 % IV SOLN
1.0000 g | Freq: Three times a day (TID) | INTRAVENOUS | Status: DC
  Administered 2015-12-13 – 2015-12-14 (×2): 1 g via INTRAVENOUS
  Filled 2015-12-13 (×5): qty 1

## 2015-12-13 MED ORDER — MORPHINE SULFATE (PF) 4 MG/ML IV SOLN
1.0000 mg | INTRAVENOUS | Status: DC | PRN
  Administered 2015-12-13: 1 mg via INTRAVENOUS
  Filled 2015-12-13: qty 1

## 2015-12-13 MED ORDER — MEGESTROL ACETATE 40 MG PO TABS
80.0000 mg | ORAL_TABLET | Freq: Every day | ORAL | Status: DC
  Filled 2015-12-13: qty 2

## 2015-12-13 MED ORDER — ATORVASTATIN CALCIUM 40 MG PO TABS
40.0000 mg | ORAL_TABLET | Freq: Every day | ORAL | Status: DC
  Administered 2015-12-15 – 2015-12-20 (×6): 40 mg via ORAL
  Filled 2015-12-13 (×7): qty 1

## 2015-12-13 MED ORDER — VANCOMYCIN HCL IN DEXTROSE 1-5 GM/200ML-% IV SOLN
1000.0000 mg | Freq: Once | INTRAVENOUS | Status: AC
  Administered 2015-12-13: 1000 mg via INTRAVENOUS
  Filled 2015-12-13: qty 200

## 2015-12-13 MED ORDER — LEVETIRACETAM 250 MG PO TABS
250.0000 mg | ORAL_TABLET | Freq: Two times a day (BID) | ORAL | Status: DC
  Administered 2015-12-13 – 2015-12-21 (×16): 250 mg via ORAL
  Filled 2015-12-13 (×17): qty 1

## 2015-12-13 MED ORDER — VANCOMYCIN HCL IN DEXTROSE 1-5 GM/200ML-% IV SOLN: 1000.0000 mg | Freq: Once | INTRAVENOUS | Status: DC

## 2015-12-13 NOTE — ED Notes (Addendum)
PA aware that bp is dropping.  1L fluid completed.  However, pt mentation improving with increased pain control.

## 2015-12-13 NOTE — Progress Notes (Signed)
ANTICOAGULATION CONSULT NOTE - Initial Consult  Pharmacy Consult for heparin Indication: Right popliteal artery aneurysm with DVT  Patient Measurements: Height: 5\' 1"  (154.9 cm) Weight: 103 lb 13.4 oz (47.1 kg) IBW/kg (Calculated) : 47.8 Heparin Dosing Weight: 47 kg  Vital Signs: Temp: 100.9 F (38.3 C) (12/08 1747) Temp Source: Oral (12/08 1747) BP: 102/58 (12/08 1700) Pulse Rate: 114 (12/08 1747)  Labs:  Recent Labs  12/13/15 1158 12/13/15 1600  HGB 12.3  --   HCT 37.8  --   PLT 287  --   APTT  --  31  LABPROT  --  15.8*  INR  --  1.25  CREATININE 1.22*  --     Estimated Creatinine Clearance: 25.1 mL/min (by C-G formula based on SCr of 1.22 mg/dL (H)).   Medical History: Past Medical History:  Diagnosis Date  . Anxiety   . Arthritis   . Attention to nephrostomy ([place])    PT HAS NEPHROSTOMY TUBE IN PLACE  . Bruises easily   . Cancer [facility])    Skin cancer  . Chronic diarrhea   . Colitis   . COPD (chronic obstructive pulmonary disease) ([place])   . Dementia   . Depression   . GERD (gastroesophageal reflux disease)   . Hiatal hernia   . Hx of bronchitis   . Hx of pulmonary edema JULY 2015  . Hx of pulmonary edema JULY 2015  . Hx of septic shock JULY 2015  . Hydronephrosis, left   . Hyperlipidemia   . Memory difficulties   . Microscopic colitis   . Myocardial infarction    unknown time   . Renal insufficiency   . Seizures ([place])    last seizure was 4-5 years ago with "brain bleed"  . Shortness of breath    with exertion  . Stroke [facility])    TIA four years ago / stroke NOV 2014    Assessment: 56 YOF admitted with recurrent redness to her right leg. 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 Pharmacy is consulted to start IV heparin. Pt is not on anticoagulation PTA, baseline Hgb/Pltc wnl. lovenox 40 mg was ordered, but pt hasn't received any dose  yet.  Goal of Therapy:  Heparin level 0.3-0.7 units/ml Monitor platelets by anticoagulation protocol: Yes   Plan:  Heparin bolus 2500 units Heparin infusion 750 units/hr F/u 8 hr heparin level with AM labs Daily heparin level and CBC D/c lovenox  Maryanna Shape, PharmD, BCPS  Clinical Pharmacist  Pager: [phone]   12/13/2015,7:41 PM

## 2015-12-13 NOTE — Progress Notes (Addendum)
Pharmacy Antibiotic Note  Bridget Ball is a 82 y.o. female admitted on 12/13/2015 with cellulitis.  Pharmacy has been consulted for vanc/aztreonam/flagyl dosing.Patient previously completed a course of IV antibiotics on 11/23  (vanc and aztreonam). She then completed 5 days of Bactrim. WBC count is 13.3 and patient is afebrile. Patient has low body mass and creatinine is 1.22. CrCl ~ 20 ml/min.  Plan: Aztreonam 1 gram every 8 hours Flagyl 500 mg every 8 hours Vancomycin 1 gm every 48 hours Vanc random if therapy continued due to poor renal function. Monitor length of therapy, ability to narrow, and renal function.   Height: 5' (152.4 cm) Weight: 93 lb (42.2 kg) IBW/kg (Calculated) : 45.5  Temp (24hrs), Avg:98.5 F (36.9 C), Min:98.5 F (36.9 C), Max:98.5 F (36.9 C)   Recent Labs Lab 12/13/15 1158 12/13/15 1217  WBC 13.3*  --   CREATININE 1.22*  --   LATICACIDVEN  --  2.39*    Estimated Creatinine Clearance: 22.5 mL/min (by C-G formula based on SCr of 1.22 mg/dL (H)).     Antimicrobials this admission: 12/8 Vanc>> 12/8 Aztreonam>> 12/8 Flagyl>>   Microbiology results: 11/8 BCx: Pending 11/8 UCx: Pending  Thank you for allowing pharmacy to be a part of this patient's care.  Ihor Austin, PharmD PGY1 Pharmacy Resident [phone] 12/13/2015 1:35 PM

## 2015-12-13 NOTE — Consult Note (Signed)
ORTHOPAEDIC CONSULTATION  REQUESTING PHYSICIAN: Elwin Mocha, MD  Chief Complaint: r/o Bridget Ball  HPI: Bridget Ball is a 82 y.o. female who presents with RLE cellulitis that's been ongoing since November that improves while on abx but reappears once she's off abx.  She's septic in the ER.  Pain is severe in the legs that's worse with touching.  Denies DM.  Denies f/c/n/v.  Ortho consulted to r/o nec fasc.  Past Medical History:  Diagnosis Date  . Anxiety   . Arthritis   . Attention to nephrostomy ([place])    PT HAS NEPHROSTOMY TUBE IN PLACE  . Bruises easily   . Cancer [facility])    Skin cancer  . Chronic diarrhea   . Colitis   . COPD (chronic obstructive pulmonary disease) ([place])   . Dementia   . Depression   . GERD (gastroesophageal reflux disease)   . Hiatal hernia   . Hx of bronchitis   . Hx of pulmonary edema JULY 2015  . Hx of pulmonary edema JULY 2015  . Hx of septic shock JULY 2015  . Hydronephrosis, left   . Hyperlipidemia   . Memory difficulties   . Microscopic colitis   . Myocardial infarction    unknown time   . Renal insufficiency   . Seizures ([place])    last seizure was 4-5 years ago with "brain bleed"  . Shortness of breath    with exertion  . Stroke [facility])    TIA four years ago / stroke NOV 2014   Past Surgical History:  Procedure Laterality Date  . APPENDECTOMY    . BREAST BIOPSY Right 02/02/2014   Procedure: RIGHT BREAST BIOPSY;  Surgeon: Jamesetta So, MD;  Location: [facility] ORS;  Service: General;  Laterality: Right;  . COLONOSCOPY N/A 05/24/2013   Procedure: COLONOSCOPY;  Surgeon: Rogene Houston, MD;  Location: [facility] ENDO SUITE;  Service: Endoscopy;  Laterality: N/A;  100  . CYSTOSCOPY W/ URETERAL STENT PLACEMENT Left 08/06/2015   Procedure: CYSTOSCOPY WITH LEFT URETERAL STENT EXCHANGE - Sammie Bench;  Surgeon: Franchot Gallo, MD;  Location: [facility] ORS;  Service: Urology;  Laterality: Left;  . CYSTOSCOPY WITH RETROGRADE PYELOGRAM, URETEROSCOPY AND STENT PLACEMENT  Left 09/18/2013   Procedure: CYSTOSCOPY WITH RETROGRADE PYELOGRAM,  URETEROSCOPY AND  STENT PLACEMENT, removal of nephrostomy tube;  Surgeon: Jorja Loa, MD;  Location: [facility] ORS;  Service: Urology;  Laterality: Left;  . CYSTOSCOPY WITH STENT PLACEMENT Left 09/04/2014   Procedure: CYSTOSCOPY WITH LEFT J2 STENT EXCHANGE;  Surgeon: Franchot Gallo, MD;  Location: [facility] ORS;  Service: Urology;  Laterality: Left;  . ESOPHAGOGASTRODUODENOSCOPY N/A 05/24/2013   Procedure: ESOPHAGOGASTRODUODENOSCOPY (EGD);  Surgeon: Rogene Houston, MD;  Location: [facility] ENDO SUITE;  Service: Endoscopy;  Laterality: N/A;  . KNEE ARTHROSCOPY Right   . SKIN CANCER EXCISION    . TONSILLECTOMY    . TUBAL LIGATION     Social History   Social History  . Marital status: Widowed    Spouse name: N/A  . Number of children: 6  . Years of education: college   Occupational History  . retired    Social History Main Topics  . Smoking status: Former Smoker    Packs/day: 1.00    Years: 50.00    Types: Cigarettes    Quit date: 11/13/1997  . Smokeless tobacco: Never Used  . Alcohol use 4.2 oz/week    7 Glasses of wine per week     Comment: 2 glasses a  night wine (none in a year)  . Drug use: No  . Sexual activity: Not Currently   Other Topics Concern  . None   Social History Narrative   Patient is widowed with 6 children.   Patient is right handed.   Patient has college education.   Patient drinks 4 cups daily.   Family History  Problem Relation Age of Onset  . Diabetes Mother   . Stroke Mother   . Heart attack Father    - negative except otherwise stated in the family history section  Prior to Admission medications   Medication Sig Start Date End Date Taking? Authorizing Provider  albuterol (PROVENTIL HFA;VENTOLIN HFA) 108 (90 BASE) MCG/ACT inhaler Inhale 2 puffs into the lungs every 6 (six) hours as needed for wheezing or shortness of breath. 08/03/13  Yes Janece Canterbury, MD  Amino Acids-Protein Hydrolys  (FEEDING SUPPLEMENT, PRO-STAT SUGAR FREE 64,) LIQD Take 30 mLs by mouth 2 (two) times daily. 11/28/15  Yes Orvan Falconer, MD  atorvastatin (LIPITOR) 80 MG tablet Take 1 tablet (80 mg total) by mouth daily at 6 PM. Patient taking differently: Take 40 mg by mouth daily at 6 PM.  08/03/13  Yes Janece Canterbury, MD  budesonide (ENTOCORT EC) 3 MG 24 hr capsule Take 6 mg by mouth every morning.  03/20/15  Yes Historical Provider, MD  citalopram (CELEXA) 10 MG tablet TAKE 1 TABLET (10 MG TOTAL) BY MOUTH DAILY. 06/11/15  Yes Garvin Fila, MD  ferrous sulfate 325 (65 FE) MG EC tablet Take 325 mg by mouth daily with breakfast.   Yes Historical Provider, MD  levETIRAcetam (KEPPRA) 250 MG tablet Take 1 tablet (250 mg total) by mouth 2 (two) times daily. 03/20/15  Yes Ward Givens, NP  megestrol (MEGACE) 40 MG tablet Take 80 mg by mouth daily. 10/15/15  Yes Historical Provider, MD  memantine (NAMENDA) 5 MG tablet SIG:  Take one tablet daily for one week  then: take one tablet in AM and one tablet in PM for one week.  then: take one tablet in AM and two tablets in PM for one week.  then: take two tablets in AM and two tablets in PM. 11/20/15  Yes Ward Givens, NP  Methylfol-Algae-B12-Acetylcyst (CEREFOLIN NAC) 6-90.314-2-600 MG TABS Take 1 tablet by mouth daily. 12/02/15  Yes Garvin Fila, MD  pantoprazole (PROTONIX) 40 MG tablet TAKE ONE TABLET ONCE DAILY WITH BREAKFAST 10/23/15  Yes Butch Penny, NP  POTASSIUM PO Take 1 capsule by mouth daily. OTC   Yes Historical Provider, MD  solifenacin (VESICARE) 5 MG tablet Take 5 mg by mouth daily.   Yes Historical Provider, MD  SPIRIVA HANDIHALER 18 MCG inhalation capsule Place 1 capsule into inhaler and inhale daily. 05/20/14  Yes Historical Provider, MD  budesonide-formoterol (SYMBICORT) 160-4.5 MCG/ACT inhaler Inhale 2 puffs into the lungs 2 (two) times daily. Patient not taking: Reported on 12/13/2015 06/13/14   Samuella Cota, MD  sulfamethoxazole-trimethoprim  (BACTRIM DS,SEPTRA DS) 800-160 MG tablet Take 1 tablet by mouth 2 (two) times daily. Patient not taking: Reported on 12/13/2015 11/28/15   Orvan Falconer, MD   Ct Femur Right W Contrast  Result Date: 12/13/2015 CLINICAL DATA:  Right leg cellulitis, worsening.  Arthritis. EXAM: CT OF THE RIGHT FEMUR WITHOUT CONTRAST CT OF THE RIGHT TIBIA/ FIBULA WITHOUT CONTRAST TECHNIQUE: Multidetector CT imaging of the lower right extremity was performed according to the standard protocol following intravenous contrast administration. COMPARISON:  None. CONTRAST:  39mL ISOVUE-300 IOPAMIDOL (  ISOVUE-300) INJECTION 61% FINDINGS: RIGHT FEMUR: No findings of active osteomyelitis. Moderate osteoarthritis of the knee with tricompartmental spurring and a trace knee effusion. There is a left ureteral stent extending into the urinary bladder. Small left inguinal lymph nodes are present. Atherosclerotic calcification in the common femoral artery and superficial femoral artery ; there is a saccular aneurysm of the SFA centered about 12 cm above the joint line of the knee, mild surrounding stranding, the aneurysm measures 3.3 by 2.8 by 2.7 cm and is partially thrombosed. There is partially occlusive deep vein thrombosis in the right popliteal vein extending down into the tibial veins. Subcutaneous edema laterally in the distal thigh. No abscess identified. RIGHT TIBIA/FIBULA: No findings of osteomyelitis. Subcutaneous edema fairly circumferentially in the calf and extending down to the ankle where it is again circumferential. This could reflect cellulitis but is nonspecific and there is no abscess or gas tracking in the soft tissues. No abnormal effacement of fat planes along the calf musculature. Aside from the clot in the popliteal vein, there is branching clot extending in the proximal aspects of the anterior tibial vein and posterior tibial vein. Atherosclerotic calcification of arterial structures noted, patent to the arterial structures is  difficult to assess as today' s exam was not performed as an angiogram type of study, correlation with distal pulses recommended. IMPRESSION: 1. 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 At the upper margin of this aneurysm there is some narrowing of the artery. Urgent vascular surgical consultation recommended for further workup. 2. Likely acute popliteal vein partially occlusive DVT extending into the anterior and posterior tibial veins. This was not shown on the prior ultrasound exam from 11/25/2015. 3. Circumferential subcutaneous edema in the calf and ankle, along with some edema in the subcutaneous tissues of the lateral thigh. Although cellulitis could possibly give this appearance, the appearance could also be related to the DVT, venous stasis, or other conditions. No abscess or findings suggesting myositis/fasciitis. 4. Moderate osteoarthritis of the knee with small knee effusion. Radiology assistant personnel have been notified to put me in telephone contact with the referring physician or the referring physician's clinical representative in order to discuss these findings. Once this communication is established I will issue an addendum to this report for documentation purposes. Electronically Signed   By: Van Clines M.D.   On: 12/13/2015 17:25   Ct Tibia Fibula Right W Contrast  Result Date: 12/13/2015 CLINICAL DATA:  Right leg cellulitis, worsening.  Arthritis. EXAM: CT OF THE RIGHT FEMUR WITHOUT CONTRAST CT OF THE RIGHT TIBIA/ FIBULA WITHOUT CONTRAST TECHNIQUE: Multidetector CT imaging of the lower right extremity was performed according to the standard protocol following intravenous contrast administration. COMPARISON:  None. CONTRAST:  1mL ISOVUE-300 IOPAMIDOL (ISOVUE-300) INJECTION 61% FINDINGS: RIGHT FEMUR: No findings of active osteomyelitis. Moderate osteoarthritis of the knee with tricompartmental  spurring and a trace knee effusion. There is a left ureteral stent extending into the urinary bladder. Small left inguinal lymph nodes are present. Atherosclerotic calcification in the common femoral artery and superficial femoral artery ; there is a saccular aneurysm of the SFA centered about 12 cm above the joint line of the knee, mild surrounding stranding, the aneurysm measures 3.3 by 2.8 by 2.7 cm and is partially thrombosed. There is partially occlusive deep vein thrombosis in the right popliteal vein extending down into the tibial veins. Subcutaneous edema laterally in the distal thigh. No abscess identified. RIGHT TIBIA/FIBULA: No  findings of osteomyelitis. Subcutaneous edema fairly circumferentially in the calf and extending down to the ankle where it is again circumferential. This could reflect cellulitis but is nonspecific and there is no abscess or gas tracking in the soft tissues. No abnormal effacement of fat planes along the calf musculature. Aside from the clot in the popliteal vein, there is branching clot extending in the proximal aspects of the anterior tibial vein and posterior tibial vein. Atherosclerotic calcification of arterial structures noted, patent to the arterial structures is difficult to assess as today' s exam was not performed as an angiogram type of study, correlation with distal pulses recommended. IMPRESSION: 1. 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 At the upper margin of this aneurysm there is some narrowing of the artery. Urgent vascular surgical consultation recommended for further workup. 2. Likely acute popliteal vein partially occlusive DVT extending into the anterior and posterior tibial veins. This was not shown on the prior ultrasound exam from 11/25/2015. 3. Circumferential subcutaneous edema in the calf and ankle, along with some edema in the subcutaneous tissues of the lateral thigh.  Although cellulitis could possibly give this appearance, the appearance could also be related to the DVT, venous stasis, or other conditions. No abscess or findings suggesting myositis/fasciitis. 4. Moderate osteoarthritis of the knee with small knee effusion. Radiology assistant personnel have been notified to put me in telephone contact with the referring physician or the referring physician's clinical representative in order to discuss these findings. Once this communication is established I will issue an addendum to this report for documentation purposes. Electronically Signed   By: Van Clines M.D.   On: 12/13/2015 17:25   - pertinent xrays, CT, MRI studies were reviewed and independently interpreted  Positive ROS: All other systems have been reviewed and were otherwise negative with the exception of those mentioned in the HPI and as above.  Physical Exam: General: Alert, no acute distress Cardiovascular: No pedal edema Respiratory: No cyanosis, no use of accessory musculature GI: No organomegaly, abdomen is soft and non-tender Skin: No lesions in the area of chief complaint Neurologic: Sensation intact distally Psychiatric: Patient is competent for consent with normal mood and affect Lymphatic: No axillary or cervical lymphadenopathy  MUSCULOSKELETAL:  - very impressive cellulitis of the RLE - no fluctuance, induration, crepitance, drainage - leg is ttp throughout cellulitis  Assessment: RLE cellulitis  Plan: - CT scan is neg for nec fasc or abscess - recommend ID consult given relapsing cellulitis - no surgical issues at this time  Thank you for the consult and the opportunity to see [name]. Eduard Roux, MD [place] 5:30 PM

## 2015-12-13 NOTE — ED Provider Notes (Signed)
[place] DEPT Provider Note   CSN: [id number] Arrival date & time: 12/13/15  1129     History   Chief Complaint Chief Complaint  Patient presents with  . leg pain/cellulitis    HPI   Blood pressure 102/55, pulse (!) 125, temperature 98.5 F (36.9 C), temperature source Oral, resp. rate 18, height 5' (1.524 m), weight 42.2 kg, SpO2 100 %.  Bridget Ball is a 82 y.o. female complaining of severe pain and erythema to right lower extremity worsening over the course of last 24 hours. Patient has history of multiple admissions over the course of last several months for cellulitis, she's had multiple DVT studies which were negative. She denies fevers, chills, nausea, vomiting, inciting trauma. Daughter-in-law states that she had some mild pain and erythema yesterday but it has progressed rapidly. Level V caveat secondary to dementia  PCP: [facility] [facility]  Past Medical History:  Diagnosis Date  . Anxiety   . Arthritis   . Attention to nephrostomy ([place])    PT HAS NEPHROSTOMY TUBE IN PLACE  . Bruises easily   . Cancer [facility])    Skin cancer  . Chronic diarrhea   . Colitis   . COPD (chronic obstructive pulmonary disease) ([place])   . Dementia   . Depression   . GERD (gastroesophageal reflux disease)   . Hiatal hernia   . Hx of bronchitis   . Hx of pulmonary edema JULY 2015  . Hx of pulmonary edema JULY 2015  . Hx of septic shock JULY 2015  . Hydronephrosis, left   . Hyperlipidemia   . Memory difficulties   . Microscopic colitis   . Myocardial infarction    unknown time   . Seizures ([place])    last seizure was 4-5 years ago with "brain bleed"  . Shortness of breath    with exertion  . Stroke [facility])    TIA four years ago / stroke NOV 2014    Patient Active Problem List   Diagnosis Date Noted  . Sepsis affecting skin ([place]) 12/13/2015  . Dementia 12/13/2015  . GERD (gastroesophageal reflux disease) 12/13/2015  . Severe protein-calorie malnutrition ([place]) 12/13/2015  .  Dyslipidemia 11/24/2015  . Cellulitis of right lower leg 11/24/2015  . Anemia 11/01/2015  . Esophageal reflux 08/17/2013  . COPD (chronic obstructive pulmonary disease) ([place]) 08/11/2013  . NSTEMI (non-ST elevated myocardial infarction) type 2 07/26/2013  . Dehydration 07/25/2013  . AKI (acute kidney injury) ([place]) 07/25/2013  . Colitis 07/08/2013  . Hydronephrosis, left 07/08/2013  . Microscopic colitis 06/27/2013  . Memory loss 04/25/2013  . Depression 04/25/2013  . SAH (subarachnoid hemorrhage) ([place]) 11/22/2012  . Seizure disorder secondary to prior [facility] 11/22/2012    Past Surgical History:  Procedure Laterality Date  . APPENDECTOMY    . BREAST BIOPSY Right 02/02/2014   Procedure: RIGHT BREAST BIOPSY;  Surgeon: Jamesetta So, MD;  Location: [facility] ORS;  Service: General;  Laterality: Right;  . COLONOSCOPY N/A 05/24/2013   Procedure: COLONOSCOPY;  Surgeon: Rogene Houston, MD;  Location: [facility] ENDO SUITE;  Service: Endoscopy;  Laterality: N/A;  100  . CYSTOSCOPY W/ URETERAL STENT PLACEMENT Left 08/06/2015   Procedure: CYSTOSCOPY WITH LEFT URETERAL STENT EXCHANGE - Sammie Bench;  Surgeon: Franchot Gallo, MD;  Location: [facility] ORS;  Service: Urology;  Laterality: Left;  . CYSTOSCOPY WITH RETROGRADE PYELOGRAM, URETEROSCOPY AND STENT PLACEMENT Left 09/18/2013   Procedure: CYSTOSCOPY WITH RETROGRADE PYELOGRAM,  URETEROSCOPY AND  STENT PLACEMENT, removal of nephrostomy tube;  Surgeon: Annie Main  Faythe Casa, MD;  Location: [facility] ORS;  Service: Urology;  Laterality: Left;  . CYSTOSCOPY WITH STENT PLACEMENT Left 09/04/2014   Procedure: CYSTOSCOPY WITH LEFT J2 STENT EXCHANGE;  Surgeon: Franchot Gallo, MD;  Location: [facility] ORS;  Service: Urology;  Laterality: Left;  . ESOPHAGOGASTRODUODENOSCOPY N/A 05/24/2013   Procedure: ESOPHAGOGASTRODUODENOSCOPY (EGD);  Surgeon: Rogene Houston, MD;  Location: [facility] ENDO SUITE;  Service: Endoscopy;  Laterality: N/A;  . KNEE ARTHROSCOPY Right   . SKIN CANCER EXCISION    .  TONSILLECTOMY    . TUBAL LIGATION      OB History    Gravida Para Term Preterm AB Living   5 5 5     5    SAB TAB Ectopic Multiple Live Births                   Home Medications    Prior to Admission medications   Medication Sig Start Date End Date Taking? Authorizing Provider  albuterol (PROVENTIL HFA;VENTOLIN HFA) 108 (90 BASE) MCG/ACT inhaler Inhale 2 puffs into the lungs every 6 (six) hours as needed for wheezing or shortness of breath. 08/03/13  Yes Janece Canterbury, MD  Amino Acids-Protein Hydrolys (FEEDING SUPPLEMENT, PRO-STAT SUGAR FREE 64,) LIQD Take 30 mLs by mouth 2 (two) times daily. 11/28/15  Yes Orvan Falconer, MD  atorvastatin (LIPITOR) 80 MG tablet Take 1 tablet (80 mg total) by mouth daily at 6 PM. Patient taking differently: Take 40 mg by mouth daily at 6 PM.  08/03/13  Yes Janece Canterbury, MD  budesonide (ENTOCORT EC) 3 MG 24 hr capsule Take 6 mg by mouth every morning.  03/20/15  Yes Historical Provider, MD  citalopram (CELEXA) 10 MG tablet TAKE 1 TABLET (10 MG TOTAL) BY MOUTH DAILY. 06/11/15  Yes Garvin Fila, MD  ferrous sulfate 325 (65 FE) MG EC tablet Take 325 mg by mouth daily with breakfast.   Yes Historical Provider, MD  levETIRAcetam (KEPPRA) 250 MG tablet Take 1 tablet (250 mg total) by mouth 2 (two) times daily. 03/20/15  Yes Ward Givens, NP  megestrol (MEGACE) 40 MG tablet Take 80 mg by mouth daily. 10/15/15  Yes Historical Provider, MD  memantine (NAMENDA) 5 MG tablet SIG:  Take one tablet daily for one week  then: take one tablet in AM and one tablet in PM for one week.  then: take one tablet in AM and two tablets in PM for one week.  then: take two tablets in AM and two tablets in PM. 11/20/15  Yes Ward Givens, NP  Methylfol-Algae-B12-Acetylcyst (CEREFOLIN NAC) 6-90.314-2-600 MG TABS Take 1 tablet by mouth daily. 12/02/15  Yes Garvin Fila, MD  pantoprazole (PROTONIX) 40 MG tablet TAKE ONE TABLET ONCE DAILY WITH BREAKFAST 10/23/15  Yes Butch Penny, NP    POTASSIUM PO Take 1 capsule by mouth daily. OTC   Yes Historical Provider, MD  solifenacin (VESICARE) 5 MG tablet Take 5 mg by mouth daily.   Yes Historical Provider, MD  SPIRIVA HANDIHALER 18 MCG inhalation capsule Place 1 capsule into inhaler and inhale daily. 05/20/14  Yes Historical Provider, MD  budesonide-formoterol (SYMBICORT) 160-4.5 MCG/ACT inhaler Inhale 2 puffs into the lungs 2 (two) times daily. Patient not taking: Reported on 12/13/2015 06/13/14   Samuella Cota, MD  sulfamethoxazole-trimethoprim (BACTRIM DS,SEPTRA DS) 800-160 MG tablet Take 1 tablet by mouth 2 (two) times daily. Patient not taking: Reported on 12/13/2015 11/28/15   Orvan Falconer, MD    Family History Family History  Problem Relation Age of Onset  . Diabetes Mother   . Stroke Mother   . Heart attack Father     Social History Social History  Substance Use Topics  . Smoking status: Former Smoker    Packs/day: 1.00    Years: 50.00    Types: Cigarettes    Quit date: 11/13/1997  . Smokeless tobacco: Never Used  . Alcohol use 4.2 oz/week    7 Glasses of wine per week     Comment: 2 glasses a night wine (none in a year)     Allergies   Penicillins   Review of Systems Review of Systems  Unable to perform ROS: Dementia      Physical Exam Updated Vital Signs BP (!) 88/61   Pulse 109   Temp 98.5 F (36.9 C) (Oral)   Resp 19   Ht 5' (1.524 m)   Wt 42.2 kg   SpO2 96%   BMI 18.16 kg/m   Physical Exam  Constitutional: She is oriented to person, place, and time. She appears well-developed and well-nourished. No distress.  HENT:  Head: Normocephalic and atraumatic.  Mouth/Throat: Oropharynx is clear and moist.  Eyes: Conjunctivae and EOM are normal. Pupils are equal, round, and reactive to light.  Neck: Normal range of motion.  Cardiovascular: Regular rhythm and intact distal pulses.   Tachycardic in the 120s  Pulmonary/Chest: Effort normal and breath sounds normal.  Abdominal: Soft. There is  no tenderness.  Musculoskeletal: Normal range of motion. She exhibits edema and tenderness.  Significant warmth, tenderness to palpation and erythema to the right lower extremity is photographed, DP and PT pulses are nonpalpable but easily auscultated by Doppler.  Neurological: She is alert and oriented to person, place, and time.  Skin: She is not diaphoretic.  Psychiatric: She has a normal mood and affect.  Nursing note and vitals reviewed.          ED Treatments / Results  Labs (all labs ordered are listed, but only abnormal results are displayed) Labs Reviewed  COMPREHENSIVE METABOLIC PANEL - Abnormal; Notable for the following:       Result Value   Potassium 3.4 (*)    Glucose, Bld 113 (*)    Creatinine, Ser 1.22 (*)    Total Protein 6.0 (*)    GFR calc non Af Amer 39 (*)    GFR calc Af Amer 45 (*)    All other components within normal limits  CBC WITH DIFFERENTIAL/PLATELET - Abnormal; Notable for the following:    WBC 13.3 (*)    RDW 15.8 (*)    Neutro Abs 12.6 (*)    Lymphs Abs 0.4 (*)    All other components within normal limits  I-STAT CG4 LACTIC ACID, ED - Abnormal; Notable for the following:    Lactic Acid, Venous 2.39 (*)    All other components within normal limits  CULTURE, BLOOD (ROUTINE X 2)  CULTURE, BLOOD (ROUTINE X 2)  URINE CULTURE  URINALYSIS, ROUTINE W REFLEX MICROSCOPIC  PROCALCITONIN  PROTIME-INR  APTT  SEDIMENTATION RATE  C-REACTIVE PROTEIN  CORTISOL  I-STAT CG4 LACTIC ACID, ED  I-STAT CG4 LACTIC ACID, ED  I-STAT CG4 LACTIC ACID, ED    EKG  EKG Interpretation None       Radiology No results found.  Procedures Procedures (including critical care time)  CRITICAL CARE Performed by: Monico Blitz   Total critical care time: 35 minutes  Critical care time was exclusive of separately billable procedures and treating  other patients.  Critical care was necessary to treat or prevent imminent or life-threatening  deterioration.  Critical care was time spent personally by me on the following activities: development of treatment plan with patient and/or surrogate as well as nursing, discussions with consultants, evaluation of patient's response to treatment, examination of patient, obtaining history from patient or surrogate, ordering and performing treatments and interventions, ordering and review of laboratory studies, ordering and review of radiographic studies, pulse oximetry and re-evaluation of patient's condition.   Medications Ordered in ED Medications  metroNIDAZOLE (FLAGYL) IVPB 500 mg (500 mg Intravenous New Bag/Given 12/13/15 1339)  vancomycin (VANCOCIN) IVPB 1000 mg/200 mL premix (1,000 mg Intravenous New Bag/Given 12/13/15 1545)  vancomycin (VANCOCIN) IVPB 1000 mg/200 mL premix (not administered)  aztreonam (AZACTAM) 1 g in dextrose 5 % 50 mL IVPB (not administered)  morphine 4 MG/ML injection 1-4 mg (not administered)  sodium chloride 0.9 % bolus 1,000 mL (0 mLs Intravenous Stopped 12/13/15 1408)  aztreonam (AZACTAM) 1 g in dextrose 5 % 50 mL IVPB (0 g Intravenous Stopped 12/13/15 1549)  potassium chloride SA (K-DUR,KLOR-CON) CR tablet 40 mEq (40 mEq Oral Given 12/13/15 1359)  fentaNYL (SUBLIMAZE) injection 25 mcg (25 mcg Intravenous Given 12/13/15 1355)  fentaNYL (SUBLIMAZE) injection 25 mcg (25 mcg Intravenous Given 12/13/15 1435)  sodium chloride 0.9 % bolus 500 mL (0 mLs Intravenous Stopped 12/13/15 1541)     Initial Impression / Assessment and Plan / ED Course  I have reviewed the triage vital signs and the nursing notes.  Pertinent labs & imaging results that were available during my care of the patient were reviewed by me and considered in my medical decision making (see chart for details).  Clinical Course     Vitals:   12/13/15 1515 12/13/15 1530 12/13/15 1545 12/13/15 1600  BP: 97/64 (!) 85/72 94/70 (!) 88/61  Pulse: 111 117 112 109  Resp: 19 (!) 28 (!) 27 19  Temp:       TempSrc:      SpO2: 96% 95% 97% 96%  Weight:      Height:        Medications  metroNIDAZOLE (FLAGYL) IVPB 500 mg (500 mg Intravenous New Bag/Given 12/13/15 1339)  vancomycin (VANCOCIN) IVPB 1000 mg/200 mL premix (1,000 mg Intravenous New Bag/Given 12/13/15 1545)  vancomycin (VANCOCIN) IVPB 1000 mg/200 mL premix (not administered)  aztreonam (AZACTAM) 1 g in dextrose 5 % 50 mL IVPB (not administered)  morphine 4 MG/ML injection 1-4 mg (not administered)  sodium chloride 0.9 % bolus 1,000 mL (0 mLs Intravenous Stopped 12/13/15 1408)  aztreonam (AZACTAM) 1 g in dextrose 5 % 50 mL IVPB (0 g Intravenous Stopped 12/13/15 1549)  potassium chloride SA (K-DUR,KLOR-CON) CR tablet 40 mEq (40 mEq Oral Given 12/13/15 1359)  fentaNYL (SUBLIMAZE) injection 25 mcg (25 mcg Intravenous Given 12/13/15 1355)  fentaNYL (SUBLIMAZE) injection 25 mcg (25 mcg Intravenous Given 12/13/15 1435)  sodium chloride 0.9 % bolus 500 mL (0 mLs Intravenous Stopped 12/13/15 1541)    EATHER POEPPING is 82 y.o. female presenting with Rapidly progressing pain, erythema and swelling to right lower extremity, recurrent cellulitis. She tachycardic in the left acid of 2.39 with a white count of 13.3. Code sepsis called, 30 mL per KG bolus initiated, patient pen allergic, imipenem, Vanco and Flagyl given.  1 L fluid bolus given, patient remains alert and awake, blood pressure is 83/62, patient put in for another 500 mL. Will be admitted to [facility] hospitalist NP Lissa Merlin who accepts  admission with Dr. Aggie Moats  Final Clinical Impressions(s) / ED Diagnoses   Final diagnoses:  Cellulitis of right leg    New Prescriptions New Prescriptions   No medications on file     Monico Blitz, PA-C 12/13/15 [address] PA-C 12/13/15 1603    Orlie Dakin, MD 12/13/15 1652

## 2015-12-13 NOTE — H&P (Signed)
History and Physical    Bridget Ball TMH:[id number] DOB: March 24, 1928 DOA: 12/13/2015   PCP: [name], MD   Patient coming from/Resides with: Private residence  Admission status: Inpatient/stepdown -medically necessary to stay a minimum 2 midnights to rule out impending and/or unexpected changes in physiologic status that may differ from initial evaluation performed in the ER and/or at time of admission. Presents with recurrent cellulitic changes to right lower extremity as evidenced by rapid and marked erythema. The edema and pain in right lower extremity onset less than 24 hours ago and has been associated with rapid progression in symptoms. Patient will require broad spectrum IV antibiotics, stat CT of the leg to rule out necrotizing soft tissue process, orthopedic consultation. She will be NPO until clarified urgent surgical intervention not required. She may require infectious disease consultation this admission.  Chief Complaint: Redness with significant pain and swelling right leg less than 24 hours  HPI: Bridget Ball is a 82 y.o. female with medical history significant for COPD, dementia, history of seizures secondary to prior subarachnoid hemorrhage, anemia, dyslipidemia, GERD who was recently discharged on 11/23 from [facility] after being treated for right lower extremity cellulitis. During that hospitalization she was initially treated with IV vancomycin without improvement in her symptoms so Azactam was added (PCN allergy) and was subsequently discharged on Bactrim which she completed on 11/28. During that hospitalization she had normal venous duplex of the lower extremities and ABIs with extensive calcification but otherwise apparently normal arterial flow. According to the patient's daughter after completion of antibiotics patient's right leg completely cleared. 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 Today when she went to check the wound because the drsg had slipped off the daughter noticed marked change in the leg as described above. Patient is currently complaining of significant pain even with minimal touch to the leg. Her sensation remains intact. Patient is complaining of feeling cold. In the ER the patient had relative hypotension with sepsis physiology criteria met therefore ER initiated fluid challenges and other sepsis protocol orders.  ED Course:  Vital Signs: BP 97/64   Pulse 111   Temp 98.5 F (36.9 C) (Oral)   Resp 19   Ht 5' (1.524 m)   Wt 42.2 kg (93 lb)   SpO2 96%   BMI 18.16 kg/m  Lab data: Sodium135,potassium3.4,BUN13,creatinine1.2to glucose 113, LFTs normal, lactic acid 2.39, white count 13,300 with neutrophils 94% and absolute neutrophils 12.6%, hemoglobin 12.3, platelets 287,000. Blood cultures obtained in the ER. Medications and treatments: Dilaudid 0.3 mg IV 1, normal saline bolus 1500 mL, Azactam 1 g IV 1, Flagyl 500 mg IV 1, vancomycin 1000 mg IV 1, potassium 40 mEq by mouth 1, Dilaudid 1 mg IV 1, fentanyl 25 g IV 2  Review of Systems:  In addition to the HPI above,  No Fever No Headache, changes with Vision or hearing, new weakness, tingling, numbness in any extremity, dizziness, dysarthria or word finding difficulty, gait disturbance or imbalance, tremors or seizure activity No problems swallowing food or Liquids, indigestion/reflux, choking or coughing while eating, abdominal pain with or after eating No Chest pain, Cough or Shortness of Breath, palpitations, orthopnea or DOE No Abdominal pain, N/V, melena,hematochezia, dark tarry stools, constipation No dysuria, malodorous urine, hematuria or flank pain No new joint pains, aches No recent unintentional weight gain or loss No polyuria, polydypsia or polyphagia  Past Medical History:  Diagnosis Date  . Anxiety   . Arthritis   . Attention to  nephrostomy ([place])    PT HAS NEPHROSTOMY TUBE IN PLACE  . Bruises easily   . Cancer [facility])    Skin cancer  . Chronic diarrhea   . Colitis   . COPD (chronic obstructive pulmonary disease) ([place])   . Dementia   . Depression   . GERD (gastroesophageal reflux disease)   . Hiatal hernia   . Hx of bronchitis   . Hx of pulmonary edema JULY 2015  . Hx of pulmonary edema JULY 2015  . Hx of septic shock JULY 2015  . Hydronephrosis, left   . Hyperlipidemia   . Memory difficulties   . Microscopic colitis   . Myocardial infarction    unknown time   . Seizures ([place])    last seizure was 4-5 years ago with "brain bleed"  . Shortness of breath    with exertion  . Stroke [facility])    TIA four years ago / stroke NOV 2014    Past Surgical History:  Procedure Laterality Date  . APPENDECTOMY    . BREAST BIOPSY Right 02/02/2014   Procedure: RIGHT BREAST BIOPSY;  Surgeon: Jamesetta So, MD;  Location: [facility] ORS;  Service: General;  Laterality: Right;  . COLONOSCOPY N/A 05/24/2013   Procedure: COLONOSCOPY;  Surgeon: Rogene Houston, MD;  Location: [facility] ENDO SUITE;  Service: Endoscopy;  Laterality: N/A;  100  . CYSTOSCOPY W/ URETERAL STENT PLACEMENT Left 08/06/2015   Procedure: CYSTOSCOPY WITH LEFT URETERAL STENT EXCHANGE - Sammie Bench;  Surgeon: Franchot Gallo, MD;  Location: [facility] ORS;  Service: Urology;  Laterality: Left;  . CYSTOSCOPY WITH RETROGRADE PYELOGRAM, URETEROSCOPY AND STENT PLACEMENT Left 09/18/2013   Procedure: CYSTOSCOPY WITH RETROGRADE PYELOGRAM,  URETEROSCOPY AND  STENT PLACEMENT, removal of nephrostomy tube;  Surgeon: Jorja Loa, MD;  Location: [facility] ORS;  Service: Urology;  Laterality: Left;  . CYSTOSCOPY WITH STENT PLACEMENT Left 09/04/2014   Procedure: CYSTOSCOPY WITH LEFT J2 STENT EXCHANGE;  Surgeon: Franchot Gallo, MD;  Location: [facility] ORS;  Service: Urology;  Laterality: Left;  . ESOPHAGOGASTRODUODENOSCOPY N/A 05/24/2013   Procedure: ESOPHAGOGASTRODUODENOSCOPY (EGD);  Surgeon: Rogene Houston, MD;  Location: [facility];  Service: Endoscopy;  Laterality: N/A;  . KNEE ARTHROSCOPY Right   . SKIN CANCER EXCISION    . TONSILLECTOMY    . TUBAL LIGATION      Social History   Social History  . Marital status: Widowed    Spouse name: N/A  . Number of children: 6  . Years of education: college   Occupational History  . retired    Social History Main Topics  . Smoking status: Former Smoker    Packs/day: 1.00    Years: 50.00    Types: Cigarettes    Quit date: 11/13/1997  . Smokeless tobacco: Never Used  . Alcohol use 4.2 oz/week    7 Glasses of wine per week     Comment: 2 glasses a night wine (none in a year)  . Drug use: No  . Sexual activity: Not Currently   Other Topics Concern  . Not on file   Social History Narrative   Patient is widowed with 6 children.   Patient is right handed.   Patient has college education.   Patient drinks 4 cups daily.    Mobility: Utilizes a cane Work history: Not obtained   Allergies  Allergen Reactions  . Penicillins Anaphylaxis  Throat swelling as a child. Tolerated ceftriaxone 07/29/13. Has patient had a PCN reaction causing immediate rash, facial/tongue/throat swelling, SOB or lightheadedness with hypotension: yes Has patient had a PCN reaction causing severe rash involving mucus membranes or skin necrosis: }yes Has patient had a PCN reaction that required hospitalization unknown Has patient had a PCN reaction occurring within the last 10 years:no If all     Family History  Problem Relation Age of Onset  . Diabetes Mother   . Stroke Mother   . Heart attack Father      Prior to Admission medications   Medication Sig Start Date End Date Taking? Authorizing Provider  albuterol (PROVENTIL HFA;VENTOLIN HFA) 108 (90 BASE) MCG/ACT inhaler Inhale 2 puffs into the lungs every 6 (six) hours as needed for wheezing or shortness of breath. 08/03/13  Yes Janece Canterbury, MD  Amino Acids-Protein Hydrolys (FEEDING  SUPPLEMENT, PRO-STAT SUGAR FREE 64,) LIQD Take 30 mLs by mouth 2 (two) times daily. 11/28/15  Yes Orvan Falconer, MD  atorvastatin (LIPITOR) 80 MG tablet Take 1 tablet (80 mg total) by mouth daily at 6 PM. Patient taking differently: Take 40 mg by mouth daily at 6 PM.  08/03/13  Yes Janece Canterbury, MD  budesonide (ENTOCORT EC) 3 MG 24 hr capsule Take 6 mg by mouth every morning.  03/20/15  Yes Historical Provider, MD  citalopram (CELEXA) 10 MG tablet TAKE 1 TABLET (10 MG TOTAL) BY MOUTH DAILY. 06/11/15  Yes Garvin Fila, MD  ferrous sulfate 325 (65 FE) MG EC tablet Take 325 mg by mouth daily with breakfast.   Yes Historical Provider, MD  levETIRAcetam (KEPPRA) 250 MG tablet Take 1 tablet (250 mg total) by mouth 2 (two) times daily. 03/20/15  Yes Ward Givens, NP  megestrol (MEGACE) 40 MG tablet Take 80 mg by mouth daily. 10/15/15  Yes Historical Provider, MD  memantine (NAMENDA) 5 MG tablet SIG:  Take one tablet daily for one week  then: take one tablet in AM and one tablet in PM for one week.  then: take one tablet in AM and two tablets in PM for one week.  then: take two tablets in AM and two tablets in PM. 11/20/15  Yes Ward Givens, NP  Methylfol-Algae-B12-Acetylcyst (CEREFOLIN NAC) 6-90.314-2-600 MG TABS Take 1 tablet by mouth daily. 12/02/15  Yes Garvin Fila, MD  pantoprazole (PROTONIX) 40 MG tablet TAKE ONE TABLET ONCE DAILY WITH BREAKFAST 10/23/15  Yes Butch Penny, NP  POTASSIUM PO Take 1 capsule by mouth daily. OTC   Yes Historical Provider, MD  solifenacin (VESICARE) 5 MG tablet Take 5 mg by mouth daily.   Yes Historical Provider, MD  SPIRIVA HANDIHALER 18 MCG inhalation capsule Place 1 capsule into inhaler and inhale daily. 05/20/14  Yes Historical Provider, MD  budesonide-formoterol (SYMBICORT) 160-4.5 MCG/ACT inhaler Inhale 2 puffs into the lungs 2 (two) times daily. Patient not taking: Reported on 12/13/2015 06/13/14   Samuella Cota, MD  sulfamethoxazole-trimethoprim (BACTRIM  DS,SEPTRA DS) 800-160 MG tablet Take 1 tablet by mouth 2 (two) times daily. Patient not taking: Reported on 12/13/2015 11/28/15   Orvan Falconer, MD    Physical Exam: Vitals:   12/13/15 1400 12/13/15 1415 12/13/15 1445 12/13/15 1515  BP: 90/63 94/64 (!) 81/56 97/64  Pulse: 116 107 113 111  Resp: 19 16 16 19   Temp:      TempSrc:      SpO2: 95% 96% 94% 96%  Weight:      Height:  Constitutional: NAD, Restless, uncomfortable 2/2 pain in right lower extremity Eyes: PERRL, lids and conjunctivae normal ENMT: Mucous membranes are dry. Posterior pharynx clear of any exudate or lesions. Normal dentition.  Neck: normal, supple, no masses, no thyromegaly Respiratory: clear to auscultation bilaterally, no wheezing, no crackles. Normal respiratory effort. No accessory muscle use.  Cardiovascular: Regular tachycardic rate and rhythm, no murmurs / rubs / gallops. No extremity edema. 1+ pedal pulses. No carotid bruits. Systolic blood pressure has ranged from 81-102 with MAP greater than 65 except for one reading Abdomen: no tenderness, no masses palpated. No hepatosplenomegaly. Bowel sounds positive.  Musculoskeletal: no clubbing / cyanosis. No joint deformity upper and lower extremities. Good ROM, no contractures. Normal muscle tone.  Skin: Left lower extremity with changes consistent with chronic venous stasis notable hemosiderin changes, distended veins primarily anterior tibialis and foot; right lower extremity with marked erythematous changes that in certain areas has evolved into an ecchymotic appearance and this is associated with pitting edema but is sparing the foot in the patellar region (please see photos below to better clarify description); patient is having significant pain disproportionate to clinical findings Neurologic: CN 2-12 grossly intact. Sensation intact including 2 right lower extremity, DTR normal. Strength 5/5 x all 4 extremities.  Psychiatric: Normal judgment and insight.  Alert and oriented x 3. Anxious mood.   Pics RLE cellulitis:        Labs on Admission: I have personally reviewed following labs and imaging studies  CBC:  Recent Labs Lab 12/13/15 1158  WBC 13.3*  NEUTROABS 12.6*  HGB 12.3  HCT 37.8  MCV 96.9  PLT 491   Basic Metabolic Panel:  Recent Labs Lab 12/13/15 1158  NA 135  K 3.4*  CL 102  CO2 24  GLUCOSE 113*  BUN 13  CREATININE 1.22*  CALCIUM 9.2   GFR: Estimated Creatinine Clearance: 22.5 mL/min (by C-G formula based on SCr of 1.22 mg/dL (H)). Liver Function Tests:  Recent Labs Lab 12/13/15 1158  AST 24  ALT 20  ALKPHOS 40  BILITOT 1.2  PROT 6.0*  ALBUMIN 3.5   No results for input(s): LIPASE, AMYLASE in the last 168 hours. No results for input(s): AMMONIA in the last 168 hours. Coagulation Profile: No results for input(s): INR, PROTIME in the last 168 hours. Cardiac Enzymes: No results for input(s): CKTOTAL, CKMB, CKMBINDEX, TROPONINI in the last 168 hours. BNP (last 3 results) No results for input(s): PROBNP in the last 8760 hours. HbA1C: No results for input(s): HGBA1C in the last 72 hours. CBG: No results for input(s): GLUCAP in the last 168 hours. Lipid Profile: No results for input(s): CHOL, HDL, LDLCALC, TRIG, CHOLHDL, LDLDIRECT in the last 72 hours. Thyroid Function Tests: No results for input(s): TSH, T4TOTAL, FREET4, T3FREE, THYROIDAB in the last 72 hours. Anemia Panel: No results for input(s): VITAMINB12, FOLATE, FERRITIN, TIBC, IRON, RETICCTPCT in the last 72 hours. Urine analysis:    Component Value Date/Time   COLORURINE YELLOW 11/08/2015 1331   APPEARANCEUR CLEAR 11/08/2015 1331   LABSPEC 1.015 11/08/2015 1331   PHURINE 6.0 11/08/2015 1331   GLUCOSEU NEGATIVE 11/08/2015 1331   HGBUR NEGATIVE 11/08/2015 1331   BILIRUBINUR NEGATIVE 11/08/2015 1331   KETONESUR NEGATIVE 11/08/2015 1331   PROTEINUR NEGATIVE 11/08/2015 1331   UROBILINOGEN 0.2 07/26/2013 2234   NITRITE NEGATIVE  11/08/2015 1331   LEUKOCYTESUR SMALL (A) 11/08/2015 1331   Sepsis Labs: @LABRCNTIP (procalcitonin:4,lacticidven:4) )No results found for this or any previous visit (from the past 240 hour(s)).  Radiological Exams on Admission: No results found.   Assessment/Plan Principal Problem:   Sepsis affecting skin /Recurrent Cellulitis of right lower leg -Patient presents with rapid onset and progression of significant right lower extremity erythema and edema with pain disproportionate to clinical findings; this is concerning for possible soft tissue necrotic process -Discussed with orthopedic physician/ Dr [name] and recommend stat CT of the lower extremity which will help clarify if truly necrotizing process-discussed with radiology and plans are to use reduced dose IV contrast for best images-see below regarding IV fluid hydration -Broad spectrum antibiotics with pharmacy dosing (IV vancomycin, Flagyl and Azactam) -Continue to cycle lactic acid -Procalcitonin, ESR, CRP, cortisol level -Follow up on blood cultures  -Has completed 1500 mL bolus as recommended by sepsis protocol and will continue normal saline at 125/HR -IV morphine for pain management **1617: SBP dropped to 82 again so give 500cc NS bolus again  Active Problems:   Dehydration/ AKI (acute kidney injury)  -Baseline renal function on 11/22: 17/0.77 -Current renal function: 13/1.22 -Continue IV fluids as above and repeat chemistries in a.m.    Seizure disorder secondary to prior SAH -Continue preadmission Keppra -If remains NPO we'll need to convert to IV dosing -Seizure precautions    COPD (chronic obstructive pulmonary disease) -Currently stable without wheezing  -Continue oral budesonide    Dementia/depression -Daughter assists with patient's ADLs/IADLs at home -Continue preadmission Namenda and Celexa    Anemia -Baseline hemoglobin 10.5 -Current hemoglobin 12.3 and setting of hemoconcentration from dehydration -CBC  in a.m. -Continue preadmission iron    Dyslipidemia -Not on medications prior to admission    GERD (gastroesophageal reflux disease -Holding preadmission PPI and setting of sepsis physiology, need for broad-spectrum antibiotics i.e. wish to minimize risk of developing C. difficile colitis    Severe protein-calorie malnutrition  -Continue preadmission Megace -Nutrition consultation      DVT prophylaxis: Lovenox  Code Status: DO NOT RESUSCITATE  Family Communication: Daughter at bedside  Disposition Plan: Anticipate discharge to preadmission home environment pending above evaluation -if patient would require surgical intervention this admission this may change discharge disposition  Consults called: Orthopedics/Xu    Lennon Richins L. ANP-BC [facility] Pager [phone]   If 7PM-7AM, please contact night-coverage [email or web address] Password [facility]  12/13/2015, 3:33 PM

## 2015-12-13 NOTE — ED Provider Notes (Signed)
Level V caveat dementia. History is obtained from her daughter Patient with reddened swollen painful right leg since yesterday. Patient's daughter reports her leg looked normal 2 days ago. No vomiting. No fever on exam patient chronically ill-appearing, cachectic heart tachycardic regular rhythm lungs clear auscultation abdomen nondistended nontender. Right lower extremity diffusely reddened and tender at lower leg and at distal two thirds of thigh. DP pulse 2+.   Orlie Dakin, MD 12/13/15 [phone]

## 2015-12-13 NOTE — ED Notes (Signed)
Pt given a [place] sandwich meal and a ginger ale with ice, per Hassan Rowan, Therapist, sports.

## 2015-12-13 NOTE — ED Notes (Signed)
Ortho at bedside.

## 2015-12-13 NOTE — ED Triage Notes (Addendum)
Patient reports recurrent right leg swelling, redness and pain. Has had recent admission for cellulitis and symptoms returned last night. Right leg with significant swelling and redness, states severe pain. Swelling and redness from thigh to ankle

## 2015-12-13 NOTE — Consult Note (Signed)
Consult Note  Patient name: Bridget Ball MRN: [id number] DOB: [birth year] Sex: female  Consulting Physician:  Dr. Lissa Merlin  Reason for Consult:  Chief Complaint  Patient presents with  . leg pain/cellulitis    HISTORY OF PRESENT ILLNESS: This is an 82 year old female who presents with recurrent redness to her right leg.  She has previously been admitted to the [facility] several times since November for similar problem.  She states that it improved with antibiotics but recurred when she was transitioned to oral antibiotics.  She denies any fevers chills nausea or vomiting.  Orthopedics was initially consult and to rule out necrotizing fasciitis which was done.  The patient has an elevated white count and lactate.  She complains of significant pain to her leg with touching it.  She does complain of swelling.  The patient met criteria for sepsis physiology based on relative hypotension.  She was given fluid challenges.  The patient suffers from COPD.  She is a former smoker.  She has a history of stroke in 2014.  She has chronic renal insufficiency, stage III.  Past Medical History:  Diagnosis Date  . Anxiety   . Arthritis   . Attention to nephrostomy ([place])    PT HAS NEPHROSTOMY TUBE IN PLACE  . Bruises easily   . Cancer [facility])    Skin cancer  . Chronic diarrhea   . Colitis   . COPD (chronic obstructive pulmonary disease) ([place])   . Dementia   . Depression   . GERD (gastroesophageal reflux disease)   . Hiatal hernia   . Hx of bronchitis   . Hx of pulmonary edema JULY 2015  . Hx of pulmonary edema JULY 2015  . Hx of septic shock JULY 2015  . Hydronephrosis, left   . Hyperlipidemia   . Memory difficulties   . Microscopic colitis   . Myocardial infarction    unknown time   . Renal insufficiency   . Seizures ([place])    last seizure was 4-5 years ago with "brain bleed"  . Shortness of breath    with exertion  . Stroke [facility])    TIA four years ago / stroke NOV 2014     Past Surgical History:  Procedure Laterality Date  . APPENDECTOMY    . BREAST BIOPSY Right 02/02/2014   Procedure: RIGHT BREAST BIOPSY;  Surgeon: Jamesetta So, MD;  Location: [facility] ORS;  Service: General;  Laterality: Right;  . COLONOSCOPY N/A 05/24/2013   Procedure: COLONOSCOPY;  Surgeon: Rogene Houston, MD;  Location: [facility] ENDO SUITE;  Service: Endoscopy;  Laterality: N/A;  100  . CYSTOSCOPY W/ URETERAL STENT PLACEMENT Left 08/06/2015   Procedure: CYSTOSCOPY WITH LEFT URETERAL STENT EXCHANGE - Sammie Bench;  Surgeon: Franchot Gallo, MD;  Location: [facility] ORS;  Service: Urology;  Laterality: Left;  . CYSTOSCOPY WITH RETROGRADE PYELOGRAM, URETEROSCOPY AND STENT PLACEMENT Left 09/18/2013   Procedure: CYSTOSCOPY WITH RETROGRADE PYELOGRAM,  URETEROSCOPY AND  STENT PLACEMENT, removal of nephrostomy tube;  Surgeon: Jorja Loa, MD;  Location: [facility] ORS;  Service: Urology;  Laterality: Left;  . CYSTOSCOPY WITH STENT PLACEMENT Left 09/04/2014   Procedure: CYSTOSCOPY WITH LEFT J2 STENT EXCHANGE;  Surgeon: Franchot Gallo, MD;  Location: [facility] ORS;  Service: Urology;  Laterality: Left;  . ESOPHAGOGASTRODUODENOSCOPY N/A 05/24/2013   Procedure: ESOPHAGOGASTRODUODENOSCOPY (EGD);  Surgeon: Rogene Houston, MD;  Location: [facility] ENDO SUITE;  Service: Endoscopy;  Laterality: N/A;  . KNEE ARTHROSCOPY Right   .  SKIN CANCER EXCISION    . TONSILLECTOMY    . TUBAL LIGATION      Social History   Social History  . Marital status: Widowed    Spouse name: N/A  . Number of children: 6  . Years of education: college   Occupational History  . retired    Social History Main Topics  . Smoking status: Former Smoker    Packs/day: 1.00    Years: 50.00    Types: Cigarettes    Quit date: 11/13/1997  . Smokeless tobacco: Never Used  . Alcohol use 4.2 oz/week    7 Glasses of wine per week     Comment: 2 glasses a night wine (none in a year)  . Drug use: No  . Sexual activity: Not Currently   Other Topics Concern  .  Not on file   Social History Narrative   Patient is widowed with 6 children.   Patient is right handed.   Patient has college education.   Patient drinks 4 cups daily.    Family History  Problem Relation Age of Onset  . Diabetes Mother   . Stroke Mother   . Heart attack Father     Allergies as of 12/13/2015 - Review Complete 12/13/2015  Allergen Reaction Noted  . Penicillins Anaphylaxis 11/12/2012    No current facility-administered medications on file prior to encounter.    Current Outpatient Prescriptions on File Prior to Encounter  Medication Sig Dispense Refill  . albuterol (PROVENTIL HFA;VENTOLIN HFA) 108 (90 BASE) MCG/ACT inhaler Inhale 2 puffs into the lungs every 6 (six) hours as needed for wheezing or shortness of breath. 1 Inhaler 0  . Amino Acids-Protein Hydrolys (FEEDING SUPPLEMENT, PRO-STAT SUGAR FREE 64,) LIQD Take 30 mLs by mouth 2 (two) times daily. 900 mL 0  . atorvastatin (LIPITOR) 80 MG tablet Take 1 tablet (80 mg total) by mouth daily at 6 PM. (Patient taking differently: Take 40 mg by mouth daily at 6 PM. ) 30 tablet 0  . budesonide (ENTOCORT EC) 3 MG 24 hr capsule Take 6 mg by mouth every morning.     . citalopram (CELEXA) 10 MG tablet TAKE 1 TABLET (10 MG TOTAL) BY MOUTH DAILY. 90 tablet 1  . ferrous sulfate 325 (65 FE) MG EC tablet Take 325 mg by mouth daily with breakfast.    . levETIRAcetam (KEPPRA) 250 MG tablet Take 1 tablet (250 mg total) by mouth 2 (two) times daily. 60 tablet 11  . megestrol (MEGACE) 40 MG tablet Take 80 mg by mouth daily.    . memantine (NAMENDA) 5 MG tablet SIG:  Take one tablet daily for one week  then: take one tablet in AM and one tablet in PM for one week.  then: take one tablet in AM and two tablets in PM for one week.  then: take two tablets in AM and two tablets in PM. 120 tablet 0  . Methylfol-Algae-B12-Acetylcyst (CEREFOLIN NAC) 6-90.314-2-600 MG TABS Take 1 tablet by mouth daily. 90 tablet 3  . pantoprazole (PROTONIX)  40 MG tablet TAKE ONE TABLET ONCE DAILY WITH BREAKFAST 30 tablet 5  . solifenacin (VESICARE) 5 MG tablet Take 5 mg by mouth daily.    . SPIRIVA HANDIHALER 18 MCG inhalation capsule Place 1 capsule into inhaler and inhale daily.  1  . budesonide-formoterol (SYMBICORT) 160-4.5 MCG/ACT inhaler Inhale 2 puffs into the lungs 2 (two) times daily. (Patient not taking: Reported on 12/13/2015) 1 Inhaler 0  . sulfamethoxazole-trimethoprim (BACTRIM DS,SEPTRA   DS) 800-160 MG tablet Take 1 tablet by mouth 2 (two) times daily. (Patient not taking: Reported on 12/13/2015) 10 tablet 0     REVIEW OF SYSTEMS: Cardiovascular: No chest pain, chest pressure, palpitations, orthopnea, or dyspnea on exertion. No claudication or rest pain,   Pulmonary: No productive cough, asthma or wheezing. Neurologic: No weakness, paresthesias, aphasia, or amaurosis. No dizziness. Hematologic: No bleeding problems or clotting disorders. Musculoskeletal: Right leg pain and swelling. Gastrointestinal: No blood in stool or hematemesis Genitourinary: No dysuria or hematuria. Psychiatric:: No history of major depression. Integumentary: No rashes or ulcers. Constitutional: No fever or chills.  PHYSICAL EXAMINATION: General: The patient appears their stated age.  Vital signs are BP 102/58   Pulse (!) 114   Temp (!) 100.9 F (38.3 C) (Oral)   Resp (!) 24   Ht 5' 1" (1.549 m)   Wt 103 lb 13.4 oz (47.1 kg)   SpO2 95%   BMI 19.62 kg/m  Pulmonary: Respirations are non-labored HEENT:  No gross abnormalities Abdomen: Soft and non-tender  Musculoskeletal: There are no major deformities.   Neurologic: No focal weakness or paresthesias are detected, Skin: The right leg is swollen with venous stasis changes.  There is redness throughout the right leg which has been marked.  There is been no expansion. Psychiatric: The patient has normal affect. Cardiovascular: There is a regular rate and rhythm.  Palpable right dorsalis pedis  pulse  Diagnostic Studies: I have reviewed her CT scan with the following findings: 1. 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 At the upper margin of this aneurysm there is some narrowing of the artery. Urgent vascular surgical consultation recommended for further workup. 2. Likely acute popliteal vein partially occlusive DVT extending into the anterior and posterior tibial veins. This was not shown on the prior ultrasound exam from 11/25/2015. 3. Circumferential subcutaneous edema in the calf and ankle, along with some edema in the subcutaneous tissues of the lateral thigh. Although cellulitis could possibly give this appearance, the appearance could also be related to the DVT, venous stasis, or other conditions. No abscess or findings suggesting myositis/fasciitis. 4. Moderate osteoarthritis of the knee with small knee effusion.   Assessment:  Right popliteal artery aneurysm with DVT Plan: I agree with Dr. Xu, I do not think this is necrotizing fasciitis.  The patient does have a DVT as evidenced on CT scan.  This is likely secondary to the compression of the popliteal aneurysm.  It is unclear to me at this point why the patient has developed such skin color changes based on a DVT.  However, since she has had several episodes in the past that responded to antibiotics and completely cleared up the redness, I would recommend broad-spectrum antibiotics at this time.  She will also need to be on IV heparin for DVT.  I will consider treatment of her popliteal aneurysm once she starts improving clinically.  I suspect this will need to be done open given the compressive effects of the aneurysm on the popliteal vein which likely caused the thrombus.  If I placed a covered stent, she would still have the compressive effect.  If she does indeed end up with an open operation, she would need cardiac clearance.  I will  continue to follow the patient while she is in the hospital.     V. Wells  IV, M.D. [facility] Office: [phone] Pager:  [phone] 

## 2015-12-14 DIAGNOSIS — L539 Erythematous condition, unspecified: Secondary | ICD-10-CM

## 2015-12-14 DIAGNOSIS — Z833 Family history of diabetes mellitus: Secondary | ICD-10-CM

## 2015-12-14 DIAGNOSIS — I872 Venous insufficiency (chronic) (peripheral): Secondary | ICD-10-CM

## 2015-12-14 DIAGNOSIS — Z823 Family history of stroke: Secondary | ICD-10-CM

## 2015-12-14 DIAGNOSIS — Z8249 Family history of ischemic heart disease and other diseases of the circulatory system: Secondary | ICD-10-CM

## 2015-12-14 DIAGNOSIS — I82439 Acute embolism and thrombosis of unspecified popliteal vein: Secondary | ICD-10-CM | POA: Diagnosis present

## 2015-12-14 DIAGNOSIS — I723 Aneurysm of iliac artery: Secondary | ICD-10-CM | POA: Diagnosis present

## 2015-12-14 DIAGNOSIS — I724 Aneurysm of artery of lower extremity: Secondary | ICD-10-CM

## 2015-12-14 DIAGNOSIS — Z87891 Personal history of nicotine dependence: Secondary | ICD-10-CM

## 2015-12-14 LAB — URINE CULTURE

## 2015-12-14 LAB — CBC
HCT: 27.1 % — ABNORMAL LOW (ref 36.0–46.0)
Hemoglobin: 9 g/dL — ABNORMAL LOW (ref 12.0–15.0)
MCH: 32.4 pg (ref 26.0–34.0)
MCHC: 33.2 g/dL (ref 30.0–36.0)
MCV: 97.5 fL (ref 78.0–100.0)
PLATELETS: 209 10*3/uL (ref 150–400)
RBC: 2.78 MIL/uL — AB (ref 3.87–5.11)
RDW: 16 % — AB (ref 11.5–15.5)
WBC: 12.5 10*3/uL — AB (ref 4.0–10.5)

## 2015-12-14 LAB — COMPREHENSIVE METABOLIC PANEL
ALK PHOS: 30 U/L — AB (ref 38–126)
ALT: 14 U/L (ref 14–54)
AST: 15 U/L (ref 15–41)
Albumin: 2.4 g/dL — ABNORMAL LOW (ref 3.5–5.0)
Anion gap: 4 — ABNORMAL LOW (ref 5–15)
BILIRUBIN TOTAL: 0.9 mg/dL (ref 0.3–1.2)
BUN: 9 mg/dL (ref 6–20)
CALCIUM: 7.9 mg/dL — AB (ref 8.9–10.3)
CHLORIDE: 109 mmol/L (ref 101–111)
CO2: 23 mmol/L (ref 22–32)
CREATININE: 0.83 mg/dL (ref 0.44–1.00)
Glucose, Bld: 95 mg/dL (ref 65–99)
Potassium: 3.4 mmol/L — ABNORMAL LOW (ref 3.5–5.1)
Sodium: 136 mmol/L (ref 135–145)
TOTAL PROTEIN: 4.4 g/dL — AB (ref 6.5–8.1)

## 2015-12-14 LAB — HEPARIN LEVEL (UNFRACTIONATED): HEPARIN UNFRACTIONATED: 0.29 [IU]/mL — AB (ref 0.30–0.70)

## 2015-12-14 MED ORDER — SODIUM CHLORIDE 0.9 % IV BOLUS (SEPSIS)
250.0000 mL | Freq: Once | INTRAVENOUS | Status: AC
  Administered 2015-12-14: 250 mL via INTRAVENOUS

## 2015-12-14 MED ORDER — POTASSIUM CHLORIDE IN NACL 20-0.9 MEQ/L-% IV SOLN
INTRAVENOUS | Status: DC
  Administered 2015-12-14: 75 mL/h via INTRAVENOUS
  Administered 2015-12-15: 05:00:00 via INTRAVENOUS
  Filled 2015-12-14 (×2): qty 1000

## 2015-12-14 MED ORDER — MAGNESIUM SULFATE 2 GM/50ML IV SOLN
2.0000 g | Freq: Once | INTRAVENOUS | Status: AC
  Administered 2015-12-14: 2 g via INTRAVENOUS
  Filled 2015-12-14: qty 50

## 2015-12-14 MED ORDER — HEPARIN BOLUS VIA INFUSION
1500.0000 [IU] | Freq: Once | INTRAVENOUS | Status: AC
  Administered 2015-12-14: 1500 [IU] via INTRAVENOUS
  Filled 2015-12-14: qty 1500

## 2015-12-14 MED ORDER — ENSURE ENLIVE PO LIQD
237.0000 mL | Freq: Two times a day (BID) | ORAL | Status: DC
  Administered 2015-12-15 – 2015-12-21 (×10): 237 mL via ORAL

## 2015-12-14 MED ORDER — SODIUM CHLORIDE 0.9 % IV BOLUS (SEPSIS)
1000.0000 mL | Freq: Once | INTRAVENOUS | Status: AC
  Administered 2015-12-14: 1000 mL via INTRAVENOUS

## 2015-12-14 NOTE — Progress Notes (Signed)
Erythema of the thigh as advanced proximally.  Pain is improved.  CT scan reviewed which does not show any abscesses or evidence of nec fasc or gas forming infection.  No ortho surgical issues at this time.  Patient and family updated at bedside today.

## 2015-12-14 NOTE — Progress Notes (Signed)
ANTICOAGULATION CONSULT NOTE  Pharmacy Consult for heparin Indication:  DVT  Patient Measurements: Height: 5\' 1"  (154.9 cm) Weight: 106 lb 11.2 oz (48.4 kg) IBW/kg (Calculated) : 47.8 Heparin Dosing Weight: 47 kg  Vital Signs: Temp: 98.9 F (37.2 C) (12/09 1216) Temp Source: Oral (12/09 1216) BP: 94/65 (12/09 1216) Pulse Rate: 114 (12/09 1216)  Labs:  Recent Labs  12/13/15 1158 12/13/15 1600 12/14/15 0307 12/14/15 1239  HGB 12.3  --  9.0*  --   HCT 37.8  --  27.1*  --   PLT 287  --  209  --   APTT  --  31  --   --   LABPROT  --  15.8*  --   --   INR  --  1.25  --   --   HEPARINUNFRC  --   --  <0.10* 0.29*  CREATININE 1.22*  --  0.83  --     Estimated Creatinine Clearance: 37.4 mL/min (by C-G formula based on SCr of 0.83 mg/dL).  Assessment: 82 y.o. female with RLE DVT continues on heparin Heparin level = 0.29 this afternoon  Goal of Therapy:  Heparin level 0.3-0.7 units/ml Monitor platelets by anticoagulation protocol: Yes   Plan:  Heparin to 1100 units / hr AM heparin level  Thank you  Anette Guarneri, PharmD [phone] 12/14/2015,1:57 PM

## 2015-12-14 NOTE — Progress Notes (Signed)
Initial Nutrition Assessment  DOCUMENTATION CODES:   Non-severe (moderate) malnutrition in context of chronic illness  INTERVENTION:  Provide Ensure Enlive po BID, each supplement provides 350 kcal and 20 grams of protein.  Encourage adequate PO intake.   NUTRITION DIAGNOSIS:   Malnutrition (Moderate) related to chronic illness as evidenced by severe depletion of body fat, moderate depletions of muscle mass.  GOAL:   Patient will meet greater than or equal to 90% of their needs  MONITOR:   PO intake, Supplement acceptance, Labs, Weight trends, Skin, I & O's  REASON FOR ASSESSMENT:   Consult Assessment of nutrition requirement/status  ASSESSMENT:   82 y.o. female with medical history significant for COPD, dementia, history of seizures secondary to prior subarachnoid hemorrhage, anemia, dyslipidemia, GERD who was recently discharged on 11/23 from [facility] after being treated for right lower extremity cellulitis.In the ER the patient had relative hypotension with sepsis physiology criteria met therefore ER initiated fluid challenges and other sepsis protocol orders.  Pt reports appetite has been decreased since admission. Pt reports not liking her breakfast this AM as she reports her foods were "too salty" and was only able to consume a few bites of food at her meal. Pt reports eating well PTA with usual consumption of at least 3 meals a day with a protein shake at least once daily. She reports her daughter and son have been encouraging to eat well at meals at home. Pt with no weight loss per weight records. RD to order Ensure to aid in caloric and protein needs. Pt encouraged to eat at meals.   Nutrition-Focused physical exam completed. Findings are severe fat depletion, moderate muscle depletion, and mild edema.   Labs and medications reviewed. Potassium low at 3.4 (currently repleating). Magnesium low at 1.5 (currently repleating). Phosphorous WNL.   Diet Order:  Diet  regular Room service appropriate? Yes; Fluid consistency: Thin  Skin:  Wound (see comment) (cellulititis lower R leg)  Last BM:  Unknown  Height:   Ht Readings from Last 1 Encounters:  12/13/15 5' 1"  (1.549 m)    Weight:   Wt Readings from Last 1 Encounters:  12/14/15 106 lb 11.2 oz (48.4 kg)    Ideal Body Weight:  47.7 kg  BMI:  Body mass index is 20.16 kg/m.  Estimated Nutritional Needs:   Kcal:  1400-1600  Protein:  60-75 grams  Fluid:  >/= 1.5 L/day  EDUCATION NEEDS:   No education needs identified at this time  Corrin Parker, MS, RD, LDN Pager # [phone] After hours/ weekend pager # [phone]

## 2015-12-14 NOTE — Consult Note (Signed)
[facility] for Infectious Disease    Date of Admission:  12/13/2015           Day 2 vancomycin        Day 2 aztreonam        Day 2 metronidazole       Reason for Consult: Right lower extremity erythema possibly due to cellulitis   Referring Physician: Dr. 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 atorvastatin  40 mg Oral q1800  . aztreonam  1 g Intravenous Q8H  . budesonide  6 mg Oral q morning - 10a  . citalopram  10 mg Oral Daily  . darifenacin  7.5 mg Oral Daily  . feeding supplement (ENSURE ENLIVE)  237 mL Oral BID BM  . ferrous sulfate  325 mg Oral Q breakfast  . levETIRAcetam  250 mg Oral BID  . memantine  10 mg Oral BID  . metronidazole  500 mg Intravenous Q8H  . sodium chloride flush  3 mL Intravenous Q12H  . tiotropium  1 capsule Inhalation Daily  . [START ON 12/15/2015] vancomycin  1,000 mg Intravenous Q48H    Recommendations: 1. Continue vancomycin 2. Discontinue aztreonam and metronidazole 3. Await final blood cultures   Assessment: She has recurrent erythema of her right leg. I'm not actually certain that this is infectious cellulitis. She did have a low-grade fever on admission and was felt to be septic because she was also hypotensive. However I note that her baseline blood pressures tend to run very low. Her blood pressure was 80/58 when she saw Dr. Laural Golden in September for a routine outpatient visit. She probably has a component of acute on chronic venous insufficiency. She certainly has no evidence of deeper soft tissue infection. If this is cellulitis, infection with  gram-negative rods and anaerobes is extremely unlikely. I do not feel she needs empiric therapy with aztreonam and metronidazole. I suspect the improvement she had last month following the addition of aztreonam was coincidental.    HPI: Bridget Ball is a 82 y.o. female with multiple medical problems. She was treated for an open wound on her right lower leg cellulitis in 2014. She was admitted in October of this year with bilateral shin ulcers and right lower extremity cellulitis. She was readmitted with a similar presentation in late November. She had not improved on outpatient doxycycline. She was started on IV vancomycin but did not improve until after aztreonam was added. She was discharged on 11/28/2015 on oral trimethoprim sulfamethoxazole. She was readmitted yesterday with increase in right lower extremity erythema associated with pain. She had temperature of 100.9 and hypotension. She was presumed to be septic and started on broad-spectrum antibiotic therapy. CT scan revealed a right superficial femoral artery aneurysm with a distal partially occluding popliteal vein DVT. Admission blood cultures are negative to date.   Review of Systems: Review of Systems  Unable to perform ROS: Mental acuity    Past Medical History:  Diagnosis Date  . Anxiety   . Arthritis   . Attention to nephrostomy ([place])  PT HAS NEPHROSTOMY TUBE IN PLACE  . Bruises easily   . Cancer [facility])    Skin cancer  . Chronic diarrhea   . Colitis   . COPD (chronic obstructive pulmonary disease) ([place])   . Dementia   . Depression   . GERD (gastroesophageal reflux disease)   . Hiatal hernia   . Hx of bronchitis   . Hx of pulmonary edema JULY 2015  . Hx of pulmonary edema JULY 2015  . Hx of septic shock JULY 2015  . Hydronephrosis, left   . Hyperlipidemia   . Memory difficulties   . Microscopic colitis   . Myocardial infarction    unknown time   . Renal insufficiency   . Seizures ([place])    last seizure was 4-5  years ago with "brain bleed"  . Shortness of breath    with exertion  . Stroke [facility]    TIA four years ago / stroke NOV 2014    Social History  Substance Use Topics  . Smoking status: Former Smoker    Packs/day: 1.00    Years: 50.00    Types: Cigarettes    Quit date: 11/13/1997  . Smokeless tobacco: Never Used  . Alcohol use 4.2 oz/week    7 Glasses of wine per week     Comment: 2 glasses a night wine (none in a year)    Family History  Problem Relation Age of Onset  . Diabetes Mother   . Stroke Mother   . Heart attack Father     OBJECTIVE: Blood pressure 94/65, pulse (!) 114, temperature 98.9 F (37.2 C), temperature source Oral, resp. rate 20, height 5\' 1"  (1.549 m), weight 106 lb 11.2 oz (48.4 kg), SpO2 100 %.  Physical Exam  Constitutional:  She is alert, pleasant and confused.  Cardiovascular: Normal rate and regular rhythm.   No murmur heard. Pulmonary/Chest: Effort normal and breath sounds normal. She has no wheezes. She has no rales.  Abdominal: Soft. She exhibits no distension. There is no tenderness.  Neurological: She is alert.  She is trying to climb out of bed.  Skin:  She has venous stasis dermatitis of both lower extremities. She has brawny erythema of her right leg from midshin to proximal thigh. Her leg is slightly warm compared to her left. Currently, she does not have any pain with palpation of her right leg.  Psychiatric: Mood and affect normal.    Lab Results Lab Results  Component Value Date   WBC 12.5 (H) 12/14/2015   HGB 9.0 (L) 12/14/2015   HCT 27.1 (L) 12/14/2015   MCV 97.5 12/14/2015   PLT 209 12/14/2015    Lab Results  Component Value Date   CREATININE 0.83 12/14/2015   BUN 9 12/14/2015   NA 136 12/14/2015   K 3.4 (L) 12/14/2015   CL 109 12/14/2015   CO2 23 12/14/2015    Lab Results  Component Value Date   ALT 14 12/14/2015   AST 15 12/14/2015   ALKPHOS 30 (L) 12/14/2015   BILITOT 0.9 12/14/2015      Microbiology: Recent Results (from the past 240 hour(s))  MRSA PCR Screening     Status: None   Collection Time: 12/13/15  5:56 PM  Result Value Ref Range Status   MRSA by PCR NEGATIVE NEGATIVE Final    Comment:        The GeneXpert MRSA Assay (FDA approved for NASAL specimens only), is one component of a comprehensive MRSA colonization surveillance program.  It is not intended to diagnose MRSA infection nor to guide or monitor treatment for MRSA infections.     Michel Bickers, MD [facility] [phone] pager   [phone] cell 12/14/2015, 3:13 PM

## 2015-12-14 NOTE — Progress Notes (Addendum)
[facility] PROGRESS NOTE  Bridget Ball YTK:[id number] DOB: [birth year] DOA: 12/13/2015   PCP: [name], MD     Assessment/Plan: Principal Problem:   Sepsis affecting skin ([place]) Active Problems:   Seizure disorder secondary to prior SAH   Dehydration   AKI (acute kidney injury) ([place])   COPD (chronic obstructive pulmonary disease) ([place])   Anemia   Dyslipidemia   Cellulitis of right lower leg   Dementia   GERD (gastroesophageal reflux disease)   Severe protein-calorie malnutrition ([place])  82 y.o. female with medical history significant for COPD, dementia, history of seizures secondary to prior subarachnoid hemorrhage, anemia, dyslipidemia, GERD who was recently discharged on 11/23 from [facility] after being treated for right lower extremity cellulitis.In the ER the patient had relative hypotension with sepsis physiology criteria met therefore ER initiated fluid challenges and other sepsis protocol orders.   Assessment and plan Sepsis affecting skin /Recurrent Cellulitis of right lower leg -Patient presents with rapid onset and progression of significant right lower extremity erythema and edema with pain disproportionate to clinical findings; this is concerning for possible soft tissue necrotic process -Discussed with orthopedic physician/ Dr [name]  , no evidence of necrotizing fasciitis Continue-Broad spectrum antibiotics with pharmacy dosing (IV vancomycin, Flagyl and Azactam) -Continue to cycle lactic acid -Procalcitonin, ESR, CRP, cortisol level -Follow up on blood cultures   CT scan showed 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 Trula Slade, continue heparin drip pending plans for further intervention     Dehydration/ AKI (acute kidney injury)  -Baseline renal function on 11/22: 17/0.77 -Current renal function: 13/1.22 Creatinine is now 0.83    Seizure disorder secondary to prior [place] -Continue preadmission Keppra -If remains NPO we'll need to convert to IV dosing -Seizure precautions    COPD (chronic obstructive pulmonary disease) -Currently stable without wheezing  -Continue oral budesonide    Dementia/depression -Daughter assists with patient's ADLs/IADLs at home -Continue preadmission Namenda and Celexa    Anemia -Baseline hemoglobin 10.5>9.0 -Current hemoglobin 12.3 and setting of hemoconcentration from dehydration -CBC in a.m. -Continue preadmission iron    Dyslipidemia -Not on medications prior to admission    GERD (gastroesophageal reflux disease -Holding preadmission PPI and setting of sepsis physiology, need for broad-spectrum antibiotics i.e. wish to minimize risk of developing C. difficile colitis    Severe protein-calorie malnutrition  -Continue preadmission Megace -Nutrition consultation    Hypomagnesemia-replete     DVT prophylaxsis heparin drip  Code Status:  DO NOT RESUSCITATE   Family Communication: Discussed in detail with the patient/ daughter , all imaging results, lab results explained to the patient   Disposition Plan: 2-3 days     Consultants:  Orthopedics  Vascular surgery  Procedures:  None  Antibiotics: Anti-infectives    Start     Dose/Rate Route Frequency Ordered Stop   12/15/15 1400  vancomycin (VANCOCIN) IVPB 1000 mg/200 mL premix     1,000 mg 200 mL/hr over 60 Minutes Intravenous Every 48 hours 12/13/15 1328     12/13/15 2300  aztreonam (AZACTAM) 1 g in dextrose 5 % 50 mL IVPB     1 g 100 mL/hr over 30 Minutes Intravenous Every 8 hours 12/13/15 1451     12/13/15 2200  aztreonam (AZACTAM) 1  g in dextrose 5 % 50 mL IVPB  Status:  Discontinued     1 g 100 mL/hr over 30 Minutes Intravenous Every 8 hours 12/13/15  1321 12/13/15 1451   12/13/15 1330  metroNIDAZOLE (FLAGYL) IVPB 500 mg     500 mg 100 mL/hr over 60 Minutes Intravenous Every 8 hours 12/13/15 1304     12/13/15 1330  vancomycin (VANCOCIN) IVPB 1000 mg/200 mL premix     1,000 mg 200 mL/hr over 60 Minutes Intravenous  Once 12/13/15 1306 12/13/15 1645   12/13/15 1315  aztreonam (AZACTAM) 1 g in dextrose 5 % 50 mL IVPB     1 g 100 mL/hr over 30 Minutes Intravenous  Once 12/13/15 1301 12/13/15 1549   12/13/15 1300  aztreonam (AZACTAM) 2 g in dextrose 5 % 50 mL IVPB  Status:  Discontinued     2 g 100 mL/hr over 30 Minutes Intravenous  Once 12/13/15 1251 12/13/15 1300   12/13/15 1300  vancomycin (VANCOCIN) IVPB 1000 mg/200 mL premix  Status:  Discontinued     1,000 mg 200 mL/hr over 60 Minutes Intravenous  Once 12/13/15 1251 12/13/15 1306         HPI/Subjective: rle redness has progressed per daughter  Objective: Vitals:   12/14/15 0029 12/14/15 0423 12/14/15 0427 12/14/15 0429  BP: (!) 95/56   (!) 83/59  Pulse: (!) 108   100  Resp: 16   (!) 26  Temp:   99.6 F (37.6 C)   TempSrc:   Oral   SpO2: 98%   98%  Weight:  48.4 kg (106 lb 11.2 oz)    Height:        Intake/Output Summary (Last 24 hours) at 12/14/15 0817 Last data filed at 12/14/15 0723  Gross per 24 hour  Intake          3790.81 ml  Output                0 ml  Net          3790.81 ml    Exam:  Examination: Cardiovascular: Regular tachycardic rate and rhythm, no murmurs / rubs / gallops. No extremity edema. 1+ pedal pulses. No carotid bruits. Systolic blood pressure has ranged from 81-102 with MAP greater than 65 except for one reading Abdomen: no tenderness, no masses palpated. No hepatosplenomegaly. Bowel sounds positive.  Musculoskeletal: no clubbing / cyanosis. No joint deformity upper and lower extremities. Good ROM, no contractures. Normal muscle tone.  Skin: Left lower extremity with changes consistent with chronic venous stasis notable hemosiderin  changes, distended veins primarily anterior tibialis and foot; right lower extremity with marked erythematous changes that in certain areas has evolved into an ecchymotic appearance and this is associated with pitting edema but is sparing the foot in the patellar region (please see photos below to better clarify description); patient is having significant pain disproportionate to clinical findings    Data Reviewed: I have personally reviewed following labs and imaging studies  Micro Results Recent Results (from the past 240 hour(s))  MRSA PCR Screening     Status: None   Collection Time: 12/13/15  5:56 PM  Result Value Ref Range Status   MRSA by PCR NEGATIVE NEGATIVE Final    Comment:        The GeneXpert MRSA Assay (FDA approved for NASAL specimens only), is one component of a comprehensive MRSA colonization surveillance program. It is not intended to diagnose MRSA infection nor to guide or monitor treatment  for MRSA infections.     Radiology Reports Ct Femur Right W Contrast  Addendum Date: 12/13/2015   ADDENDUM REPORT: 12/13/2015 17:58 ADDENDUM: The original report was by Dr. Van Clines. The following addendum is by Dr. Van Clines: These results were called by telephone at the time of interpretation on 12/13/2015 at 5:56 pm to Dr. Doren Custard HOBBS, who verbally acknowledged these results. Electronically Signed   By: Van Clines M.D.   On: 12/13/2015 17:58   Result Date: 12/13/2015 CLINICAL DATA:  Right leg cellulitis, worsening.  Arthritis. EXAM: CT OF THE RIGHT FEMUR WITHOUT CONTRAST CT OF THE RIGHT TIBIA/ FIBULA WITHOUT CONTRAST TECHNIQUE: Multidetector CT imaging of the lower right extremity was performed according to the standard protocol following intravenous contrast administration. COMPARISON:  None. CONTRAST:  27m ISOVUE-300 IOPAMIDOL (ISOVUE-300) INJECTION 61% FINDINGS: RIGHT FEMUR: No findings of active osteomyelitis. Moderate osteoarthritis of the knee  with tricompartmental spurring and a trace knee effusion. There is a left ureteral stent extending into the urinary bladder. Small left inguinal lymph nodes are present. Atherosclerotic calcification in the common femoral artery and superficial femoral artery ; there is a saccular aneurysm of the SFA centered about 12 cm above the joint line of the knee, mild surrounding stranding, the aneurysm measures 3.3 by 2.8 by 2.7 cm and is partially thrombosed. There is partially occlusive deep vein thrombosis in the right popliteal vein extending down into the tibial veins. Subcutaneous edema laterally in the distal thigh. No abscess identified. RIGHT TIBIA/FIBULA: No findings of osteomyelitis. Subcutaneous edema fairly circumferentially in the calf and extending down to the ankle where it is again circumferential. This could reflect cellulitis but is nonspecific and there is no abscess or gas tracking in the soft tissues. No abnormal effacement of fat planes along the calf musculature. Aside from the clot in the popliteal vein, there is branching clot extending in the proximal aspects of the anterior tibial vein and posterior tibial vein. Atherosclerotic calcification of arterial structures noted, patent to the arterial structures is difficult to assess as today' s exam was not performed as an angiogram type of study, correlation with distal pulses recommended. IMPRESSION: 1. 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 At the upper margin of this aneurysm there is some narrowing of the artery. Urgent vascular surgical consultation recommended for further workup. 2. Likely acute popliteal vein partially occlusive DVT extending into the anterior and posterior tibial veins. This was not shown on the prior ultrasound exam from 11/25/2015. 3. Circumferential subcutaneous edema in the calf and ankle, along with some edema in the subcutaneous tissues  of the lateral thigh. Although cellulitis could possibly give this appearance, the appearance could also be related to the DVT, venous stasis, or other conditions. No abscess or findings suggesting myositis/fasciitis. 4. Moderate osteoarthritis of the knee with small knee effusion. Radiology assistant personnel have been notified to put me in telephone contact with the referring physician or the referring physician's clinical representative in order to discuss these findings. Once this communication is established I will issue an addendum to this report for documentation purposes. Electronically Signed: By: WVan ClinesM.D. On: 12/13/2015 17:25   Ct Tibia Fibula Right W Contrast  Addendum Date: 12/13/2015   ADDENDUM REPORT: 12/13/2015 17:58 ADDENDUM: The original report was by Dr. WVan Clines The following addendum is by Dr. WVan Clines These results were called by telephone at the time of interpretation on 12/13/2015 at 5:56 pm  to Dr. Doren Custard HOBBS, who verbally acknowledged these results. Electronically Signed   By: Van Clines M.D.   On: 12/13/2015 17:58   Result Date: 12/13/2015 CLINICAL DATA:  Right leg cellulitis, worsening.  Arthritis. EXAM: CT OF THE RIGHT FEMUR WITHOUT CONTRAST CT OF THE RIGHT TIBIA/ FIBULA WITHOUT CONTRAST TECHNIQUE: Multidetector CT imaging of the lower right extremity was performed according to the standard protocol following intravenous contrast administration. COMPARISON:  None. CONTRAST:  86m ISOVUE-300 IOPAMIDOL (ISOVUE-300) INJECTION 61% FINDINGS: RIGHT FEMUR: No findings of active osteomyelitis. Moderate osteoarthritis of the knee with tricompartmental spurring and a trace knee effusion. There is a left ureteral stent extending into the urinary bladder. Small left inguinal lymph nodes are present. Atherosclerotic calcification in the common femoral artery and superficial femoral artery ; there is a saccular aneurysm of the SFA centered about 12 cm  above the joint line of the knee, mild surrounding stranding, the aneurysm measures 3.3 by 2.8 by 2.7 cm and is partially thrombosed. There is partially occlusive deep vein thrombosis in the right popliteal vein extending down into the tibial veins. Subcutaneous edema laterally in the distal thigh. No abscess identified. RIGHT TIBIA/FIBULA: No findings of osteomyelitis. Subcutaneous edema fairly circumferentially in the calf and extending down to the ankle where it is again circumferential. This could reflect cellulitis but is nonspecific and there is no abscess or gas tracking in the soft tissues. No abnormal effacement of fat planes along the calf musculature. Aside from the clot in the popliteal vein, there is branching clot extending in the proximal aspects of the anterior tibial vein and posterior tibial vein. Atherosclerotic calcification of arterial structures noted, patent to the arterial structures is difficult to assess as today' s exam was not performed as an angiogram type of study, correlation with distal pulses recommended. IMPRESSION: 1. 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 At the upper margin of this aneurysm there is some narrowing of the artery. Urgent vascular surgical consultation recommended for further workup. 2. Likely acute popliteal vein partially occlusive DVT extending into the anterior and posterior tibial veins. This was not shown on the prior ultrasound exam from 11/25/2015. 3. Circumferential subcutaneous edema in the calf and ankle, along with some edema in the subcutaneous tissues of the lateral thigh. Although cellulitis could possibly give this appearance, the appearance could also be related to the DVT, venous stasis, or other conditions. No abscess or findings suggesting myositis/fasciitis. 4. Moderate osteoarthritis of the knee with small knee effusion. Radiology assistant personnel have been  notified to put me in telephone contact with the referring physician or the referring physician's clinical representative in order to discuss these findings. Once this communication is established I will issue an addendum to this report for documentation purposes. Electronically Signed: By: WVan ClinesM.D. On: 12/13/2015 17:25   [place]Venous Img Lower Unilateral Right  Result Date: 11/25/2015 CLINICAL DATA:  Acute on chronic cellulitis EXAM: RIGHT LOWER EXTREMITY VENOUS DOPPLER ULTRASOUND TECHNIQUE: Gray-scale sonography with compression, as well as color and duplex ultrasound, were performed to evaluate the deep venous system from the level of the common femoral vein through the popliteal and proximal calf veins. COMPARISON:  11/03/2015 FINDINGS: Normal compressibility of the common femoral, superficial femoral, and popliteal veins, as well as the proximal calf veins. No filling defects to suggest DVT on grayscale or color Doppler imaging. Doppler waveforms show normal direction of venous flow, normal respiratory phasicity and response  to augmentation. Visualized segments of the saphenous venous system normal in caliber and compressibility. Survey views of the contralateral common femoral vein are unremarkable. IMPRESSION: No evidence of  lower extremity deep vein thrombosis, right. Electronically Signed   By: Lucrezia Europe M.D.   On: 11/25/2015 10:51   US Arterial Seg Single  Result Date: 11/25/2015 CLINICAL DATA:  Bilateral toe ulcers. Right lower extremity cellulitis. EXAM: NONINVASIVE PHYSIOLOGIC VASCULAR STUDY OF BILATERAL LOWER EXTREMITIES TECHNIQUE: Evaluation of both lower extremities were performed at rest, including calculation of ankle-brachial indices with single level Doppler, pressure and pulse volume recording. COMPARISON:  None. FINDINGS: Right ABI:  1.46 Left ABI:  1.80 Right Lower Extremity: Normal triphasic Doppler waveform in the right posterior tibial artery. Left Lower Extremity:  Triphasic Doppler waveforms in the left ankle. IMPRESSION: Ankle-brachial indices are markedly elevated. Findings suggest heavily calcified arteries. No significant abnormality involving the ankle waveforms. Electronically Signed   By: Markus Daft M.D.   On: 11/25/2015 10:26     CBC  Recent Labs Lab 12/13/15 1158 12/14/15 0307  WBC 13.3* 12.5*  HGB 12.3 9.0*  HCT 37.8 27.1*  PLT 287 209  MCV 96.9 97.5  MCH 31.5 32.4  MCHC 32.5 33.2  RDW 15.8* 16.0*  LYMPHSABS 0.4*  --   MONOABS 0.3  --   EOSABS 0.0  --   BASOSABS 0.0  --     Chemistries   Recent Labs Lab 12/13/15 1158 12/13/15 1759 12/14/15 0307  NA 135  --  136  K 3.4*  --  3.4*  CL 102  --  109  CO2 24  --  23  GLUCOSE 113*  --  95  BUN 13  --  9  CREATININE 1.22*  --  0.83  CALCIUM 9.2  --  7.9*  MG  --  1.5*  --   AST 24  --  15  ALT 20  --  14  ALKPHOS 40  --  30*  BILITOT 1.2  --  0.9   ------------------------------------------------------------------------------------------------------------------ estimated creatinine clearance is 37.4 mL/min (by C-G formula based on SCr of 0.83 mg/dL). ------------------------------------------------------------------------------------------------------------------ No results for input(s): HGBA1C in the last 72 hours. ------------------------------------------------------------------------------------------------------------------ No results for input(s): CHOL, HDL, LDLCALC, TRIG, CHOLHDL, LDLDIRECT in the last 72 hours. ------------------------------------------------------------------------------------------------------------------ No results for input(s): TSH, T4TOTAL, T3FREE, THYROIDAB in the last 72 hours.  Invalid input(s): FREET3 ------------------------------------------------------------------------------------------------------------------ No results for input(s): VITAMINB12, FOLATE, FERRITIN, TIBC, IRON, RETICCTPCT in the last 72 hours.  Coagulation  profile  Recent Labs Lab 12/13/15 1600  INR 1.25    No results for input(s): DDIMER in the last 72 hours.  Cardiac Enzymes No results for input(s): CKMB, TROPONINI, MYOGLOBIN in the last 168 hours.  Invalid input(s): CK ------------------------------------------------------------------------------------------------------------------ Invalid input(s): POCBNP   CBG:  Recent Labs Lab 12/13/15 2127  GLUCAP 104*       Studies: Ct Femur Right W Contrast  Addendum Date: 12/13/2015   ADDENDUM REPORT: 12/13/2015 17:58 ADDENDUM: The original report was by Dr. Van Clines. The following addendum is by Dr. Van Clines: These results were called by telephone at the time of interpretation on 12/13/2015 at 5:56 pm to Dr. Doren Custard HOBBS, who verbally acknowledged these results. Electronically Signed   By: Van Clines M.D.   On: 12/13/2015 17:58   Result Date: 12/13/2015 CLINICAL DATA:  Right leg cellulitis, worsening.  Arthritis. EXAM: CT OF THE RIGHT FEMUR WITHOUT CONTRAST CT OF THE RIGHT TIBIA/ FIBULA WITHOUT CONTRAST TECHNIQUE: Multidetector CT imaging of  the lower right extremity was performed according to the standard protocol following intravenous contrast administration. COMPARISON:  None. CONTRAST:  31m ISOVUE-300 IOPAMIDOL (ISOVUE-300) INJECTION 61% FINDINGS: RIGHT FEMUR: No findings of active osteomyelitis. Moderate osteoarthritis of the knee with tricompartmental spurring and a trace knee effusion. There is a left ureteral stent extending into the urinary bladder. Small left inguinal lymph nodes are present. Atherosclerotic calcification in the common femoral artery and superficial femoral artery ; there is a saccular aneurysm of the SFA centered about 12 cm above the joint line of the knee, mild surrounding stranding, the aneurysm measures 3.3 by 2.8 by 2.7 cm and is partially thrombosed. There is partially occlusive deep vein thrombosis in the right popliteal vein  extending down into the tibial veins. Subcutaneous edema laterally in the distal thigh. No abscess identified. RIGHT TIBIA/FIBULA: No findings of osteomyelitis. Subcutaneous edema fairly circumferentially in the calf and extending down to the ankle where it is again circumferential. This could reflect cellulitis but is nonspecific and there is no abscess or gas tracking in the soft tissues. No abnormal effacement of fat planes along the calf musculature. Aside from the clot in the popliteal vein, there is branching clot extending in the proximal aspects of the anterior tibial vein and posterior tibial vein. Atherosclerotic calcification of arterial structures noted, patent to the arterial structures is difficult to assess as today' s exam was not performed as an angiogram type of study, correlation with distal pulses recommended. IMPRESSION: 1. 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 At the upper margin of this aneurysm there is some narrowing of the artery. Urgent vascular surgical consultation recommended for further workup. 2. Likely acute popliteal vein partially occlusive DVT extending into the anterior and posterior tibial veins. This was not shown on the prior ultrasound exam from 11/25/2015. 3. Circumferential subcutaneous edema in the calf and ankle, along with some edema in the subcutaneous tissues of the lateral thigh. Although cellulitis could possibly give this appearance, the appearance could also be related to the DVT, venous stasis, or other conditions. No abscess or findings suggesting myositis/fasciitis. 4. Moderate osteoarthritis of the knee with small knee effusion. Radiology assistant personnel have been notified to put me in telephone contact with the referring physician or the referring physician's clinical representative in order to discuss these findings. Once this communication is established I will issue an  addendum to this report for documentation purposes. Electronically Signed: By: WVan ClinesM.D. On: 12/13/2015 17:25   Ct Tibia Fibula Right W Contrast  Addendum Date: 12/13/2015   ADDENDUM REPORT: 12/13/2015 17:58 ADDENDUM: The original report was by Dr. WVan Clines The following addendum is by Dr. WVan Clines These results were called by telephone at the time of interpretation on 12/13/2015 at 5:56 pm to Dr. PDoren CustardHOBBS, who verbally acknowledged these results. Electronically Signed   By: WVan ClinesM.D.   On: 12/13/2015 17:58   Result Date: 12/13/2015 CLINICAL DATA:  Right leg cellulitis, worsening.  Arthritis. EXAM: CT OF THE RIGHT FEMUR WITHOUT CONTRAST CT OF THE RIGHT TIBIA/ FIBULA WITHOUT CONTRAST TECHNIQUE: Multidetector CT imaging of the lower right extremity was performed according to the standard protocol following intravenous contrast administration. COMPARISON:  None. CONTRAST:  76mISOVUE-300 IOPAMIDOL (ISOVUE-300) INJECTION 61% FINDINGS: RIGHT FEMUR: No findings of active osteomyelitis. Moderate osteoarthritis of the knee with tricompartmental spurring and a trace knee effusion. There is a left ureteral stent extending into the urinary  bladder. Small left inguinal lymph nodes are present. Atherosclerotic calcification in the common femoral artery and superficial femoral artery ; there is a saccular aneurysm of the SFA centered about 12 cm above the joint line of the knee, mild surrounding stranding, the aneurysm measures 3.3 by 2.8 by 2.7 cm and is partially thrombosed. There is partially occlusive deep vein thrombosis in the right popliteal vein extending down into the tibial veins. Subcutaneous edema laterally in the distal thigh. No abscess identified. RIGHT TIBIA/FIBULA: No findings of osteomyelitis. Subcutaneous edema fairly circumferentially in the calf and extending down to the ankle where it is again circumferential. This could reflect cellulitis but is  nonspecific and there is no abscess or gas tracking in the soft tissues. No abnormal effacement of fat planes along the calf musculature. Aside from the clot in the popliteal vein, there is branching clot extending in the proximal aspects of the anterior tibial vein and posterior tibial vein. Atherosclerotic calcification of arterial structures noted, patent to the arterial structures is difficult to assess as today' s exam was not performed as an angiogram type of study, correlation with distal pulses recommended. IMPRESSION: 1. 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 At the upper margin of this aneurysm there is some narrowing of the artery. Urgent vascular surgical consultation recommended for further workup. 2. Likely acute popliteal vein partially occlusive DVT extending into the anterior and posterior tibial veins. This was not shown on the prior ultrasound exam from 11/25/2015. 3. Circumferential subcutaneous edema in the calf and ankle, along with some edema in the subcutaneous tissues of the lateral thigh. Although cellulitis could possibly give this appearance, the appearance could also be related to the DVT, venous stasis, or other conditions. No abscess or findings suggesting myositis/fasciitis. 4. Moderate osteoarthritis of the knee with small knee effusion. Radiology assistant personnel have been notified to put me in telephone contact with the referring physician or the referring physician's clinical representative in order to discuss these findings. Once this communication is established I will issue an addendum to this report for documentation purposes. Electronically Signed: By: Van Clines M.D. On: 12/13/2015 17:25      Lab Results  Component Value Date   HGBA1C  01/24/2008    5.7 (NOTE)   The ADA recommends the following therapeutic goal for glycemic   control related to Hgb A1C measurement:   Goal  of Therapy:   < 7.0% Hgb A1C   Reference: American Diabetes Association: Clinical Practice   Recommendations 2008, Diabetes Care,  2008, 31:(Suppl 1).   Lab Results  Component Value Date   LDLCALC 118 (H) 11/21/2012   CREATININE 0.83 12/14/2015       Scheduled Meds: . atorvastatin  40 mg Oral q1800  . aztreonam  1 g Intravenous Q8H  . budesonide  6 mg Oral q morning - 10a  . citalopram  10 mg Oral Daily  . darifenacin  7.5 mg Oral Daily  . ferrous sulfate  325 mg Oral Q breakfast  . levETIRAcetam  250 mg Oral BID  . megestrol  80 mg Oral Daily  . memantine  10 mg Oral BID  . metronidazole  500 mg Intravenous Q8H  . sodium chloride flush  3 mL Intravenous Q12H  . tiotropium  1 capsule Inhalation Daily  . [START ON 12/15/2015] vancomycin  1,000 mg Intravenous Q48H   Continuous Infusions: . sodium chloride 125 mL/hr (12/14/15 0726)  . heparin  950 Units/hr (12/14/15 0432)     LOS: 1 day    Time spent: >30 MINS    [facility] Pager [phone]. If 7PM-7AM, please contact night-coverage at [email or web address], password [facility] 12/14/2015, 8:17 AM  LOS: 1 day

## 2015-12-14 NOTE — Progress Notes (Addendum)
[facility]  Subjective  - Comfortable over all.   Objective (!) 83/59 100 99.6 F (37.6 C) (Oral) (!) 26 98%  Intake/Output Summary (Last 24 hours) at 12/14/15 0835 Last data filed at 12/14/15 [id number]  Gross per 24 hour  Intake          3790.81 ml  Output                0 ml  Net          3790.81 ml   Right leg erythema unchanged from yesterday per makings on her skin. Lungs non lbored breathing  Heart RRR   Assessment/Planning: Right popliteal artery aneurysm with DVT  On Heparin and IV antibiotics Vancomycin and Flagyl Tm 100.9 WBC 13.3  treatment of her popliteal aneurysm per Dr. Trula Slade in the future.  Laurence Slate [facility] 12/14/2015 8:35 AM --  Laboratory Lab Results:  Recent Labs  12/13/15 1158 12/14/15 0307  WBC 13.3* 12.5*  HGB 12.3 9.0*  HCT 37.8 27.1*  PLT 287 209   BMET  Recent Labs  12/13/15 1158 12/14/15 0307  NA 135 136  K 3.4* 3.4*  CL 102 109  CO2 24 23  GLUCOSE 113* 95  BUN 13 9  CREATININE 1.22* 0.83  CALCIUM 9.2 7.9*    COAG Lab Results  Component Value Date   INR 1.25 12/13/2015   INR 0.92 11/24/2015   INR 1.00 07/28/2013   No results found for: PTT  I agree with the above.  I have seen and examined the patient.  She has had slight expansion of the rednesss in the superior aspect of her leg.  She continues to have a palpable pedal pulse  Cellulitis:  Her daughter stated that Vancomycin has not worked in the past.  ID consult has been recommended for ABX management DVT:  Continue IV heparin POP aneurysm:  Will consider repair next week if she improves clinically.  She will need cardiology clearance for surgery   [name] [phone]

## 2015-12-14 NOTE — Progress Notes (Signed)
Patient noted to be increasingly confused.  States the date is "30" and that she is in her own home.  Affect inappropriate to situation / laughs inappropriately at times.  Patient is impulsive and attempts to get up on own.  Deemed to be a high fall risk based on multiple factors, including age, history of multiple falls in the past, and dementia.  Order obtained for telemonitor sitter for safety.  Bed in low position and bed alarm on.

## 2015-12-14 NOTE — Progress Notes (Signed)
ANTICOAGULATION CONSULT NOTE  Pharmacy Consult for heparin Indication:  DVT  Patient Measurements: Height: 5\' 1"  (154.9 cm) Weight: 106 lb 11.2 oz (48.4 kg) IBW/kg (Calculated) : 47.8 Heparin Dosing Weight: 47 kg  Vital Signs: Temp: 98.9 F (37.2 C) (12/09 0027) Temp Source: Oral (12/09 0027) BP: 95/56 (12/09 0029) Pulse Rate: 108 (12/09 0029)  Labs:  Recent Labs  12/13/15 1158 12/13/15 1600 12/14/15 0307  HGB 12.3  --  9.0*  HCT 37.8  --  27.1*  PLT 287  --  209  APTT  --  31  --   LABPROT  --  15.8*  --   INR  --  1.25  --   HEPARINUNFRC  --   --  <0.10*  CREATININE 1.22*  --   --     Estimated Creatinine Clearance: 25.4 mL/min (by C-G formula based on SCr of 1.22 mg/dL (H)).  Assessment: 82 y.o. female with RLE DVT for heparin  Goal of Therapy:  Heparin level 0.3-0.7 units/ml Monitor platelets by anticoagulation protocol: Yes   Plan:  Heparin 1500 units IV bolus, then increase heparin 950 units/hr   Check heparin level in 8 hours.   Phillis Knack, PharmD, BCPS  12/14/2015,4:25 AM

## 2015-12-15 ENCOUNTER — Inpatient Hospital Stay (HOSPITAL_COMMUNITY): Payer: Medicare Other

## 2015-12-15 DIAGNOSIS — E440 Moderate protein-calorie malnutrition: Secondary | ICD-10-CM | POA: Insufficient documentation

## 2015-12-15 DIAGNOSIS — Z01810 Encounter for preprocedural cardiovascular examination: Secondary | ICD-10-CM

## 2015-12-15 DIAGNOSIS — B9689 Other specified bacterial agents as the cause of diseases classified elsewhere: Secondary | ICD-10-CM

## 2015-12-15 LAB — CBC
HCT: 28.9 % — ABNORMAL LOW (ref 36.0–46.0)
HEMATOCRIT: 31.2 % — AB (ref 36.0–46.0)
HEMOGLOBIN: 9.4 g/dL — AB (ref 12.0–15.0)
Hemoglobin: 10.1 g/dL — ABNORMAL LOW (ref 12.0–15.0)
MCH: 32 pg (ref 26.0–34.0)
MCH: 31.9 pg (ref 26.0–34.0)
MCHC: 32.5 g/dL (ref 30.0–36.0)
MCHC: 32.4 g/dL (ref 30.0–36.0)
MCV: 98.3 fL (ref 78.0–100.0)
MCV: 98.4 fL (ref 78.0–100.0)
PLATELETS: 193 10*3/uL (ref 150–400)
PLATELETS: 219 10*3/uL (ref 150–400)
RBC: 2.94 MIL/uL — AB (ref 3.87–5.11)
RBC: 3.17 MIL/uL — ABNORMAL LOW (ref 3.87–5.11)
RDW: 16.1 % — ABNORMAL HIGH (ref 11.5–15.5)
RDW: 15.8 % — AB (ref 11.5–15.5)
WBC: 15.2 10*3/uL — AB (ref 4.0–10.5)
WBC: 14.9 10*3/uL — ABNORMAL HIGH (ref 4.0–10.5)

## 2015-12-15 LAB — COMPREHENSIVE METABOLIC PANEL
ALK PHOS: 45 U/L (ref 38–126)
ALT: 18 U/L (ref 14–54)
AST: 17 U/L (ref 15–41)
Albumin: 2.6 g/dL — ABNORMAL LOW (ref 3.5–5.0)
Anion gap: 5 (ref 5–15)
BUN: 7 mg/dL (ref 6–20)
CALCIUM: 8.1 mg/dL — AB (ref 8.9–10.3)
CHLORIDE: 108 mmol/L (ref 101–111)
CO2: 22 mmol/L (ref 22–32)
CREATININE: 0.83 mg/dL (ref 0.44–1.00)
GFR calc non Af Amer: >60 mL/min (ref >60)
GLUCOSE: 89 mg/dL (ref 65–99)
Potassium: 3.4 mmol/L — ABNORMAL LOW (ref 3.5–5.1)
SODIUM: 135 mmol/L (ref 135–145)
Total Bilirubin: 0.6 mg/dL (ref 0.3–1.2)
Total Protein: 5.1 g/dL — ABNORMAL LOW (ref 6.5–8.1)

## 2015-12-15 LAB — VAS US CAROTID
LCCAPDIAS: -10 cm/s
LCCAPSYS: -45 cm/s
LEFT ECA DIAS: 10 cm/s
LEFT VERTEBRAL DIAS: -16 cm/s
LICADDIAS: -16 cm/s
LICAPSYS: -40 cm/s
Left CCA dist dias: 11 cm/s
Left CCA dist sys: 49 cm/s
Left ICA dist sys: -42 cm/s
Left ICA prox dias: -13 cm/s
RCCADSYS: -39 cm/s
RCCAPDIAS: -14 cm/s
RIGHT ECA DIAS: -9 cm/s
RIGHT VERTEBRAL DIAS: 12 cm/s
Right CCA prox sys: -51 cm/s

## 2015-12-15 LAB — HEPARIN LEVEL (UNFRACTIONATED)
Heparin Unfractionated: 0.24 IU/mL — ABNORMAL LOW (ref 0.30–0.70)
Heparin Unfractionated: 0.53 IU/mL (ref 0.30–0.70)

## 2015-12-15 MED ORDER — ALPRAZOLAM 0.25 MG PO TABS
0.2500 mg | ORAL_TABLET | Freq: Once | ORAL | Status: AC
  Administered 2015-12-15: 0.25 mg via ORAL
  Filled 2015-12-15: qty 1

## 2015-12-15 MED ORDER — LORAZEPAM 2 MG/ML IJ SOLN
0.5000 mg | Freq: Once | INTRAMUSCULAR | Status: AC
  Administered 2015-12-15: 0.5 mg via INTRAVENOUS
  Filled 2015-12-15: qty 1

## 2015-12-15 MED ORDER — MAGNESIUM OXIDE 400 (241.3 MG) MG PO TABS
400.0000 mg | ORAL_TABLET | Freq: Two times a day (BID) | ORAL | Status: DC
  Administered 2015-12-15 – 2015-12-21 (×13): 400 mg via ORAL
  Filled 2015-12-15 (×13): qty 1

## 2015-12-15 MED ORDER — POTASSIUM CHLORIDE IN NACL 40-0.9 MEQ/L-% IV SOLN
INTRAVENOUS | Status: DC
  Administered 2015-12-15 – 2015-12-16 (×3): 75 mL/h via INTRAVENOUS
  Administered 2015-12-17 – 2015-12-20 (×4): 50 mL/h via INTRAVENOUS
  Filled 2015-12-15 (×12): qty 1000

## 2015-12-15 NOTE — Progress Notes (Signed)
Subjective  -   Resting comfortably   Physical Exam:  Erythema in right leg is much less intense, however it has expanded more in the upper right thigh Much less tender today       Assessment/Plan:    Appreciate ID input, continue abx Needs cardiology clearance for possible surgery later this week to address SFA/Pop aneurysm DVT:  Continue heparin gtt Will obtain LE vein mapping and carotid duplex  Bridget Ball, [place] 12/15/2015 1:09 PM --  Vitals:   12/15/15 1100 12/15/15 1200  BP: 137/85 117/72  Pulse: (!) 39 75  Resp: (!) 21   Temp:  98.4 F (36.9 C)    Intake/Output Summary (Last 24 hours) at 12/15/15 1309 Last data filed at 12/15/15 1302  Gross per 24 hour  Intake          2649.73 ml  Output             2100 ml  Net           549.73 ml     Laboratory CBC    Component Value Date/Time   WBC 14.9 (H) 12/15/2015 1044   HGB 10.1 (L) 12/15/2015 1044   HCT 31.2 (L) 12/15/2015 1044   PLT 219 12/15/2015 1044    BMET    Component Value Date/Time   NA 135 12/15/2015 0131   NA 142 06/24/2015 1425   K 3.4 (L) 12/15/2015 0131   CL 108 12/15/2015 0131   CO2 22 12/15/2015 0131   GLUCOSE 89 12/15/2015 0131   BUN 7 12/15/2015 0131   BUN 16 06/24/2015 1425   CREATININE 0.83 12/15/2015 0131   CALCIUM 8.1 (L) 12/15/2015 0131   GFRNONAA >60 12/15/2015 0131   GFRAA >60 12/15/2015 0131    COAG Lab Results  Component Value Date   INR 1.25 12/13/2015   INR 0.92 11/24/2015   INR 1.00 07/28/2013   No results found for: PTT  Antibiotics Anti-infectives    Start     Dose/Rate Route Frequency Ordered Stop   12/15/15 1400  vancomycin (VANCOCIN) IVPB 1000 mg/200 mL premix     1,000 mg 200 mL/hr over 60 Minutes Intravenous Every 48 hours 12/13/15 1328     12/13/15 2300  aztreonam (AZACTAM) 1 g in dextrose 5 % 50 mL IVPB  Status:  Discontinued     1 g 100 mL/hr over 30 Minutes Intravenous Every 8 hours 12/13/15 1451 12/14/15 1526   12/13/15 2200  aztreonam  (AZACTAM) 1 g in dextrose 5 % 50 mL IVPB  Status:  Discontinued     1 g 100 mL/hr over 30 Minutes Intravenous Every 8 hours 12/13/15 1321 12/13/15 1451   12/13/15 1330  metroNIDAZOLE (FLAGYL) IVPB 500 mg  Status:  Discontinued     500 mg 100 mL/hr over 60 Minutes Intravenous Every 8 hours 12/13/15 1304 12/14/15 1526   12/13/15 1330  vancomycin (VANCOCIN) IVPB 1000 mg/200 mL premix     1,000 mg 200 mL/hr over 60 Minutes Intravenous  Once 12/13/15 1306 12/13/15 1645   12/13/15 1315  aztreonam (AZACTAM) 1 g in dextrose 5 % 50 mL IVPB     1 g 100 mL/hr over 30 Minutes Intravenous  Once 12/13/15 1301 12/13/15 1549   12/13/15 1300  aztreonam (AZACTAM) 2 g in dextrose 5 % 50 mL IVPB  Status:  Discontinued     2 g 100 mL/hr over 30 Minutes Intravenous  Once 12/13/15 1251 12/13/15 1300   12/13/15 1300  vancomycin (  VANCOCIN) IVPB 1000 mg/200 mL premix  Status:  Discontinued     1,000 mg 200 mL/hr over 60 Minutes Intravenous  Once 12/13/15 1251 12/13/15 1306       V. Leia Alf, M.D. [facility] Office: [phone] Pager:  [phone]

## 2015-12-15 NOTE — Plan of Care (Signed)
Problem: Education: Goal: Knowledge of [place] [facility] Education information/materials will improve Outcome: Progressing Discussed plan of care with patient and daughter in room with some teach back displayed.  Was asked to not wake her for medications at bedtime if she was asleep by family

## 2015-12-15 NOTE — Progress Notes (Signed)
[facility] PROGRESS NOTE  Bridget Ball VEL:[id number] DOB: 1928/01/10 DOA: 12/13/2015   PCP: [name], MD     Assessment/Plan: Active Problems:   Seizure disorder secondary to prior [facility]   Dehydration   AKI (acute kidney injury) ([place])   COPD (chronic obstructive pulmonary disease) (HCC)   Anemia   Dyslipidemia   Erythema of lower extremity   Dementia   GERD (gastroesophageal reflux disease)   Severe protein-calorie malnutrition (HCC)   DVT of popliteal vein (HCC)   Aneurysm of left internal iliac artery ([place])  82 y.o. female with medical history significant for COPD, dementia, history of seizures secondary to prior subarachnoid hemorrhage, anemia, dyslipidemia, GERD who was recently discharged on 11/23 from [facility] after being treated for right lower extremity cellulitis.In the ER the patient had relative hypotension with sepsis physiology criteria met therefore ER initiated fluid challenges and other sepsis protocol orders.   Assessment and plan Sepsis affecting skin /Recurrent Cellulitis of right lower leg -Patient presents with rapid onset and progression of significant right lower extremity erythema and edema with pain disproportionate to clinical findings; this is concerning for possible soft tissue necrotic process -Discussed with orthopedic physician/ Dr [name]  , no evidence of necrotizing fasciitis  Antibiotics narrowed as per infectious disease recommendations-Dr. Megan Salon Cortisol level within normal limits. Pro-calcitonin 6.87, CRP 8.1, lactic acid 2.02 Blood cultures no growth so far  CT scan showed 3.3 cm pseudoaneurysm-distal right superficial femoral artery/acute DVT extending into the anterior and posterior tibial veins     Right popliteal artery aneurysm with DVT Currently on a heparin drip as recommended by vascular surgery   popliteal aneurysm -per Dr. Trula Slade, continue heparin drip pending plans for further intervention  Will  consider repair next week if she improves clinically She will need cardiology clearance for surgery, will order preop echo     Dehydration/ AKI (acute kidney injury)  -Baseline renal function on 11/22: 17/0.77 -Current renal function: 13/1.22 Creatinine is now 0.83    Seizure disorder secondary to prior [place] -Continue preadmission Keppra -If remains NPO we'll need to convert to IV dosing -Seizure precautions    COPD (chronic obstructive pulmonary disease) -Currently stable without wheezing  -Continue oral budesonide    Dementia/depression -Daughter assists with patient's ADLs/IADLs at home -Continue preadmission Namenda and Celexa    Anemia -Baseline hemoglobin 10.5>9.0 -Current hemoglobin 12.3 and setting of hemoconcentration from dehydration -CBC in a.m.      Dyslipidemia -Not on medications prior to admission    GERD (gastroesophageal reflux disease -Holding preadmission PPI and setting of sepsis physiology, need for broad-spectrum antibiotics i.e. wish to minimize risk of developing C. difficile colitis    Severe protein-calorie malnutrition  Megace discontinued due to increased risk of DVTs -Nutrition consultation    Hypomagnesemia-replete     DVT prophylaxsis heparin drip  Code Status:  DO NOT RESUSCITATE   Family Communication: Discussed in detail with the patient/ daughter , all imaging results, lab results explained to the patient   Disposition Plan: 2-3 days, transferred to telemetry     Consultants:  Orthopedics  Vascular surgery  Procedures:  None  Antibiotics: Anti-infectives    Start     Dose/Rate Route Frequency Ordered Stop   12/15/15 1400  vancomycin (VANCOCIN) IVPB 1000 mg/200 mL premix     1,000 mg 200 mL/hr over 60 Minutes Intravenous Every 48 hours 12/13/15 1328     12/13/15 2300  aztreonam (AZACTAM) 1 g in  dextrose 5 % 50 mL IVPB  Status:  Discontinued     1 g 100 mL/hr over 30 Minutes Intravenous Every 8 hours  12/13/15 1451 12/14/15 1526   12/13/15 2200  aztreonam (AZACTAM) 1 g in dextrose 5 % 50 mL IVPB  Status:  Discontinued     1 g 100 mL/hr over 30 Minutes Intravenous Every 8 hours 12/13/15 1321 12/13/15 1451   12/13/15 1330  metroNIDAZOLE (FLAGYL) IVPB 500 mg  Status:  Discontinued     500 mg 100 mL/hr over 60 Minutes Intravenous Every 8 hours 12/13/15 1304 12/14/15 1526   12/13/15 1330  vancomycin (VANCOCIN) IVPB 1000 mg/200 mL premix     1,000 mg 200 mL/hr over 60 Minutes Intravenous  Once 12/13/15 1306 12/13/15 1645   12/13/15 1315  aztreonam (AZACTAM) 1 g in dextrose 5 % 50 mL IVPB     1 g 100 mL/hr over 30 Minutes Intravenous  Once 12/13/15 1301 12/13/15 1549   12/13/15 1300  aztreonam (AZACTAM) 2 g in dextrose 5 % 50 mL IVPB  Status:  Discontinued     2 g 100 mL/hr over 30 Minutes Intravenous  Once 12/13/15 1251 12/13/15 1300   12/13/15 1300  vancomycin (VANCOCIN) IVPB 1000 mg/200 mL premix  Status:  Discontinued     1,000 mg 200 mL/hr over 60 Minutes Intravenous  Once 12/13/15 1251 12/13/15 1306         HPI/Subjective: Confused last night with sun downing   Objective: Vitals:   12/15/15 0336 12/15/15 0400 12/15/15 0700 12/15/15 0744  BP:  133/74 140/81 138/82  Pulse: (!) 108 (!) 106  100  Resp: 20 (!) 23  (!) 24  Temp: 98.4 F (36.9 C)   97.5 F (36.4 C)  TempSrc: Oral   Axillary  SpO2: 100% 99%  99%  Weight: 48.9 kg (107 lb 12.8 oz)     Height:        Intake/Output Summary (Last 24 hours) at 12/15/15 0941 Last data filed at 12/15/15 0900  Gross per 24 hour  Intake          2649.73 ml  Output             1000 ml  Net          1649.73 ml    Exam:  Examination: Cardiovascular: Regular tachycardic rate and rhythm, no murmurs / rubs / gallops. No extremity edema. 1+ pedal pulses. No carotid bruits. Systolic blood pressure has ranged from 81-102 with MAP greater than 65 except for one reading Abdomen: no tenderness, no masses palpated. No hepatosplenomegaly.  Bowel sounds positive.  Musculoskeletal: no clubbing / cyanosis. No joint deformity upper and lower extremities. Good ROM, no contractures. Normal muscle tone.  Skin: Left lower extremity with changes consistent with chronic venous stasis notable hemosiderin changes, distended veins primarily anterior tibialis and foot; right lower extremity with marked erythematous changes that in certain areas has evolved into an ecchymotic appearance and this is associated with pitting edema but is sparing the foot in the patellar region (please see photos below to better clarify description); patient is having significant pain disproportionate to clinical findings    Data Reviewed: I have personally reviewed following labs and imaging studies  Micro Results Recent Results (from the past 240 hour(s))  Blood Culture (routine x 2)     Status: None (Preliminary result)   Collection Time: 12/13/15 12:25 PM  Result Value Ref Range Status   Specimen Description BLOOD LEFT FOREARM  Final   Special Requests BOTTLES DRAWN AEROBIC AND ANAEROBIC 5CC  Final   Culture NO GROWTH 1 DAY  Final   Report Status PENDING  Incomplete  Blood Culture (routine x 2)     Status: None (Preliminary result)   Collection Time: 12/13/15  1:15 PM  Result Value Ref Range Status   Specimen Description BLOOD RIGHT ANTECUBITAL  Final   Special Requests BOTTLES DRAWN AEROBIC AND ANAEROBIC 5CC  Final   Culture NO GROWTH 1 DAY  Final   Report Status PENDING  Incomplete  MRSA PCR Screening     Status: None   Collection Time: 12/13/15  5:56 PM  Result Value Ref Range Status   MRSA by PCR NEGATIVE NEGATIVE Final    Comment:        The GeneXpert MRSA Assay (FDA approved for NASAL specimens only), is one component of a comprehensive MRSA colonization surveillance program. It is not intended to diagnose MRSA infection nor to guide or monitor treatment for MRSA infections.   Urine culture     Status: Abnormal   Collection Time:  12/13/15  6:08 PM  Result Value Ref Range Status   Specimen Description URINE, RANDOM  Final   Special Requests NONE  Final   Culture MULTIPLE SPECIES PRESENT, SUGGEST RECOLLECTION (A)  Final   Report Status 12/14/2015 FINAL  Final    Radiology Reports Ct Femur Right W Contrast  Addendum Date: 12/13/2015   ADDENDUM REPORT: 12/13/2015 17:58 ADDENDUM: The original report was by Dr. Van Clines. The following addendum is by Dr. Van Clines: These results were called by telephone at the time of interpretation on 12/13/2015 at 5:56 pm to Dr. Doren Custard HOBBS, who verbally acknowledged these results. Electronically Signed   By: Van Clines M.D.   On: 12/13/2015 17:58   Result Date: 12/13/2015 CLINICAL DATA:  Right leg cellulitis, worsening.  Arthritis. EXAM: CT OF THE RIGHT FEMUR WITHOUT CONTRAST CT OF THE RIGHT TIBIA/ FIBULA WITHOUT CONTRAST TECHNIQUE: Multidetector CT imaging of the lower right extremity was performed according to the standard protocol following intravenous contrast administration. COMPARISON:  None. CONTRAST:  53m ISOVUE-300 IOPAMIDOL (ISOVUE-300) INJECTION 61% FINDINGS: RIGHT FEMUR: No findings of active osteomyelitis. Moderate osteoarthritis of the knee with tricompartmental spurring and a trace knee effusion. There is a left ureteral stent extending into the urinary bladder. Small left inguinal lymph nodes are present. Atherosclerotic calcification in the common femoral artery and superficial femoral artery ; there is a saccular aneurysm of the SFA centered about 12 cm above the joint line of the knee, mild surrounding stranding, the aneurysm measures 3.3 by 2.8 by 2.7 cm and is partially thrombosed. There is partially occlusive deep vein thrombosis in the right popliteal vein extending down into the tibial veins. Subcutaneous edema laterally in the distal thigh. No abscess identified. RIGHT TIBIA/FIBULA: No findings of osteomyelitis. Subcutaneous edema fairly  circumferentially in the calf and extending down to the ankle where it is again circumferential. This could reflect cellulitis but is nonspecific and there is no abscess or gas tracking in the soft tissues. No abnormal effacement of fat planes along the calf musculature. Aside from the clot in the popliteal vein, there is branching clot extending in the proximal aspects of the anterior tibial vein and posterior tibial vein. Atherosclerotic calcification of arterial structures noted, patent to the arterial structures is difficult to assess as today' s exam was not performed as an angiogram type of study, correlation with distal pulses recommended. IMPRESSION: 1. 3.3  cm pseudoaneurysm or saccular aneurysm of the distal right superficial femoral artery, with mild surrounding stranding. At the upper margin of this aneurysm there is some narrowing of the artery. Urgent vascular surgical consultation recommended for further workup. 2. Likely acute popliteal vein partially occlusive DVT extending into the anterior and posterior tibial veins. This was not shown on the prior ultrasound exam from 11/25/2015. 3. Circumferential subcutaneous edema in the calf and ankle, along with some edema in the subcutaneous tissues of the lateral thigh. Although cellulitis could possibly give this appearance, the appearance could also be related to the DVT, venous stasis, or other conditions. No abscess or findings suggesting myositis/fasciitis. 4. Moderate osteoarthritis of the knee with small knee effusion. Radiology assistant personnel have been notified to put me in telephone contact with the referring physician or the referring physician's clinical representative in order to discuss these findings. Once this communication is established I will issue an addendum to this report for documentation purposes. Electronically Signed: By: Van Clines M.D. On: 12/13/2015 17:25   Ct Tibia Fibula Right W Contrast  Addendum Date:  12/13/2015   ADDENDUM REPORT: 12/13/2015 17:58 ADDENDUM: The original report was by Dr. Van Clines. The following addendum is by Dr. Van Clines: These results were called by telephone at the time of interpretation on 12/13/2015 at 5:56 pm to Dr. Doren Custard HOBBS, who verbally acknowledged these results. Electronically Signed   By: Van Clines M.D.   On: 12/13/2015 17:58   Result Date: 12/13/2015 CLINICAL DATA:  Right leg cellulitis, worsening.  Arthritis. EXAM: CT OF THE RIGHT FEMUR WITHOUT CONTRAST CT OF THE RIGHT TIBIA/ FIBULA WITHOUT CONTRAST TECHNIQUE: Multidetector CT imaging of the lower right extremity was performed according to the standard protocol following intravenous contrast administration. COMPARISON:  None. CONTRAST:  36m ISOVUE-300 IOPAMIDOL (ISOVUE-300) INJECTION 61% FINDINGS: RIGHT FEMUR: No findings of active osteomyelitis. Moderate osteoarthritis of the knee with tricompartmental spurring and a trace knee effusion. There is a left ureteral stent extending into the urinary bladder. Small left inguinal lymph nodes are present. Atherosclerotic calcification in the common femoral artery and superficial femoral artery ; there is a saccular aneurysm of the SFA centered about 12 cm above the joint line of the knee, mild surrounding stranding, the aneurysm measures 3.3 by 2.8 by 2.7 cm and is partially thrombosed. There is partially occlusive deep vein thrombosis in the right popliteal vein extending down into the tibial veins. Subcutaneous edema laterally in the distal thigh. No abscess identified. RIGHT TIBIA/FIBULA: No findings of osteomyelitis. Subcutaneous edema fairly circumferentially in the calf and extending down to the ankle where it is again circumferential. This could reflect cellulitis but is nonspecific and there is no abscess or gas tracking in the soft tissues. No abnormal effacement of fat planes along the calf musculature. Aside from the clot in the popliteal vein,  there is branching clot extending in the proximal aspects of the anterior tibial vein and posterior tibial vein. Atherosclerotic calcification of arterial structures noted, patent to the arterial structures is difficult to assess as today' s exam was not performed as an angiogram type of study, correlation with distal pulses recommended. IMPRESSION: 1. 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 At the upper margin of this aneurysm there is some narrowing of the artery. Urgent vascular surgical consultation recommended for further workup. 2. Likely acute popliteal vein partially occlusive DVT extending into the anterior and posterior tibial veins. This was not  shown on the prior ultrasound exam from 11/25/2015. 3. Circumferential subcutaneous edema in the calf and ankle, along with some edema in the subcutaneous tissues of the lateral thigh. Although cellulitis could possibly give this appearance, the appearance could also be related to the DVT, venous stasis, or other conditions. No abscess or findings suggesting myositis/fasciitis. 4. Moderate osteoarthritis of the knee with small knee effusion. Radiology assistant personnel have been notified to put me in telephone contact with the referring physician or the referring physician's clinical representative in order to discuss these findings. Once this communication is established I will issue an addendum to this report for documentation purposes. Electronically Signed: By: Van Clines M.D. On: 12/13/2015 17:25   US Venous Img Lower Unilateral Right  Result Date: 11/25/2015 CLINICAL DATA:  Acute on chronic cellulitis EXAM: RIGHT LOWER EXTREMITY VENOUS DOPPLER ULTRASOUND TECHNIQUE: Gray-scale sonography with compression, as well as color and duplex ultrasound, were performed to evaluate the deep venous system from the level of the common femoral vein through the popliteal and  proximal calf veins. COMPARISON:  11/03/2015 FINDINGS: Normal compressibility of the common femoral, superficial femoral, and popliteal veins, as well as the proximal calf veins. No filling defects to suggest DVT on grayscale or color Doppler imaging. Doppler waveforms show normal direction of venous flow, normal respiratory phasicity and response to augmentation. Visualized segments of the saphenous venous system normal in caliber and compressibility. Survey views of the contralateral common femoral vein are unremarkable. IMPRESSION: No evidence of  lower extremity deep vein thrombosis, right. Electronically Signed   By: Lucrezia Europe M.D.   On: 11/25/2015 10:51   US Arterial Seg Single  Result Date: 11/25/2015 CLINICAL DATA:  Bilateral toe ulcers. Right lower extremity cellulitis. EXAM: NONINVASIVE PHYSIOLOGIC VASCULAR STUDY OF BILATERAL LOWER EXTREMITIES TECHNIQUE: Evaluation of both lower extremities were performed at rest, including calculation of ankle-brachial indices with single level Doppler, pressure and pulse volume recording. COMPARISON:  None. FINDINGS: Right ABI:  1.46 Left ABI:  1.80 Right Lower Extremity: Normal triphasic Doppler waveform in the right posterior tibial artery. Left Lower Extremity: Triphasic Doppler waveforms in the left ankle. IMPRESSION: Ankle-brachial indices are markedly elevated. Findings suggest heavily calcified arteries. No significant abnormality involving the ankle waveforms. Electronically Signed   By: Markus Daft M.D.   On: 11/25/2015 10:26     CBC  Recent Labs Lab 12/13/15 1158 12/14/15 0307 12/15/15 0131  WBC 13.3* 12.5* 15.2*  HGB 12.3 9.0* 9.4*  HCT 37.8 27.1* 28.9*  PLT 287 209 193  MCV 96.9 97.5 98.3  MCH 31.5 32.4 32.0  MCHC 32.5 33.2 32.5  RDW 15.8* 16.0* 16.1*  LYMPHSABS 0.4*  --   --   MONOABS 0.3  --   --   EOSABS 0.0  --   --   BASOSABS 0.0  --   --     Chemistries   Recent Labs Lab 12/13/15 1158 12/13/15 1759 12/14/15 0307  12/15/15 0131  NA 135  --  136 135  K 3.4*  --  3.4* 3.4*  CL 102  --  109 108  CO2 24  --  23 22  GLUCOSE 113*  --  95 89  BUN 13  --  9 7  CREATININE 1.22*  --  0.83 0.83  CALCIUM 9.2  --  7.9* 8.1*  MG  --  1.5*  --   --   AST 24  --  15 17  ALT 20  --  14 18  ALKPHOS 40  --  30* 45  BILITOT 1.2  --  0.9 0.6   ------------------------------------------------------------------------------------------------------------------ estimated creatinine clearance is 37.4 mL/min (by C-G formula based on SCr of 0.83 mg/dL). ------------------------------------------------------------------------------------------------------------------ No results for input(s): HGBA1C in the last 72 hours. ------------------------------------------------------------------------------------------------------------------ No results for input(s): CHOL, HDL, LDLCALC, TRIG, CHOLHDL, LDLDIRECT in the last 72 hours. ------------------------------------------------------------------------------------------------------------------ No results for input(s): TSH, T4TOTAL, T3FREE, THYROIDAB in the last 72 hours.  Invalid input(s): FREET3 ------------------------------------------------------------------------------------------------------------------ No results for input(s): VITAMINB12, FOLATE, FERRITIN, TIBC, IRON, RETICCTPCT in the last 72 hours.  Coagulation profile  Recent Labs Lab 12/13/15 1600  INR 1.25    No results for input(s): DDIMER in the last 72 hours.  Cardiac Enzymes No results for input(s): CKMB, TROPONINI, MYOGLOBIN in the last 168 hours.  Invalid input(s): CK ------------------------------------------------------------------------------------------------------------------ Invalid input(s): POCBNP   CBG:  Recent Labs Lab 12/13/15 2127  GLUCAP 104*       Studies: Ct Femur Right W Contrast  Addendum Date: 12/13/2015   ADDENDUM REPORT: 12/13/2015 17:58 ADDENDUM: The original  report was by Dr. Van Clines. The following addendum is by Dr. Van Clines: These results were called by telephone at the time of interpretation on 12/13/2015 at 5:56 pm to Dr. Doren Custard HOBBS, who verbally acknowledged these results. Electronically Signed   By: Van Clines M.D.   On: 12/13/2015 17:58   Result Date: 12/13/2015 CLINICAL DATA:  Right leg cellulitis, worsening.  Arthritis. EXAM: CT OF THE RIGHT FEMUR WITHOUT CONTRAST CT OF THE RIGHT TIBIA/ FIBULA WITHOUT CONTRAST TECHNIQUE: Multidetector CT imaging of the lower right extremity was performed according to the standard protocol following intravenous contrast administration. COMPARISON:  None. CONTRAST:  52m ISOVUE-300 IOPAMIDOL (ISOVUE-300) INJECTION 61% FINDINGS: RIGHT FEMUR: No findings of active osteomyelitis. Moderate osteoarthritis of the knee with tricompartmental spurring and a trace knee effusion. There is a left ureteral stent extending into the urinary bladder. Small left inguinal lymph nodes are present. Atherosclerotic calcification in the common femoral artery and superficial femoral artery ; there is a saccular aneurysm of the SFA centered about 12 cm above the joint line of the knee, mild surrounding stranding, the aneurysm measures 3.3 by 2.8 by 2.7 cm and is partially thrombosed. There is partially occlusive deep vein thrombosis in the right popliteal vein extending down into the tibial veins. Subcutaneous edema laterally in the distal thigh. No abscess identified. RIGHT TIBIA/FIBULA: No findings of osteomyelitis. Subcutaneous edema fairly circumferentially in the calf and extending down to the ankle where it is again circumferential. This could reflect cellulitis but is nonspecific and there is no abscess or gas tracking in the soft tissues. No abnormal effacement of fat planes along the calf musculature. Aside from the clot in the popliteal vein, there is branching clot extending in the proximal aspects of the  anterior tibial vein and posterior tibial vein. Atherosclerotic calcification of arterial structures noted, patent to the arterial structures is difficult to assess as today' s exam was not performed as an angiogram type of study, correlation with distal pulses recommended. IMPRESSION: 1. 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 At the upper margin of this aneurysm there is some narrowing of the artery. Urgent vascular surgical consultation recommended for further workup. 2. Likely acute popliteal vein partially occlusive DVT extending into the anterior and posterior tibial veins. This was not shown on the prior ultrasound exam from 11/25/2015. 3. Circumferential subcutaneous edema in the calf and ankle, along with  some edema in the subcutaneous tissues of the lateral thigh. Although cellulitis could possibly give this appearance, the appearance could also be related to the DVT, venous stasis, or other conditions. No abscess or findings suggesting myositis/fasciitis. 4. Moderate osteoarthritis of the knee with small knee effusion. Radiology assistant personnel have been notified to put me in telephone contact with the referring physician or the referring physician's clinical representative in order to discuss these findings. Once this communication is established I will issue an addendum to this report for documentation purposes. Electronically Signed: By: Van Clines M.D. On: 12/13/2015 17:25   Ct Tibia Fibula Right W Contrast  Addendum Date: 12/13/2015   ADDENDUM REPORT: 12/13/2015 17:58 ADDENDUM: The original report was by Dr. Van Clines. The following addendum is by Dr. Van Clines: These results were called by telephone at the time of interpretation on 12/13/2015 at 5:56 pm to Dr. Doren Custard HOBBS, who verbally acknowledged these results. Electronically Signed   By: Van Clines M.D.   On: 12/13/2015  17:58   Result Date: 12/13/2015 CLINICAL DATA:  Right leg cellulitis, worsening.  Arthritis. EXAM: CT OF THE RIGHT FEMUR WITHOUT CONTRAST CT OF THE RIGHT TIBIA/ FIBULA WITHOUT CONTRAST TECHNIQUE: Multidetector CT imaging of the lower right extremity was performed according to the standard protocol following intravenous contrast administration. COMPARISON:  None. CONTRAST:  42m ISOVUE-300 IOPAMIDOL (ISOVUE-300) INJECTION 61% FINDINGS: RIGHT FEMUR: No findings of active osteomyelitis. Moderate osteoarthritis of the knee with tricompartmental spurring and a trace knee effusion. There is a left ureteral stent extending into the urinary bladder. Small left inguinal lymph nodes are present. Atherosclerotic calcification in the common femoral artery and superficial femoral artery ; there is a saccular aneurysm of the SFA centered about 12 cm above the joint line of the knee, mild surrounding stranding, the aneurysm measures 3.3 by 2.8 by 2.7 cm and is partially thrombosed. There is partially occlusive deep vein thrombosis in the right popliteal vein extending down into the tibial veins. Subcutaneous edema laterally in the distal thigh. No abscess identified. RIGHT TIBIA/FIBULA: No findings of osteomyelitis. Subcutaneous edema fairly circumferentially in the calf and extending down to the ankle where it is again circumferential. This could reflect cellulitis but is nonspecific and there is no abscess or gas tracking in the soft tissues. No abnormal effacement of fat planes along the calf musculature. Aside from the clot in the popliteal vein, there is branching clot extending in the proximal aspects of the anterior tibial vein and posterior tibial vein. Atherosclerotic calcification of arterial structures noted, patent to the arterial structures is difficult to assess as today' s exam was not performed as an angiogram type of study, correlation with distal pulses recommended. IMPRESSION: 1. 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 At the upper margin of this aneurysm there is some narrowing of the artery. Urgent vascular surgical consultation recommended for further workup. 2. Likely acute popliteal vein partially occlusive DVT extending into the anterior and posterior tibial veins. This was not shown on the prior ultrasound exam from 11/25/2015. 3. Circumferential subcutaneous edema in the calf and ankle, along with some edema in the subcutaneous tissues of the lateral thigh. Although cellulitis could possibly give this appearance, the appearance could also be related to the DVT, venous stasis, or other conditions. No abscess or findings suggesting myositis/fasciitis. 4. Moderate osteoarthritis of the knee with small knee effusion. Radiology assistant personnel have been notified to put me in  telephone contact with the referring physician or the referring physician's clinical representative in order to discuss these findings. Once this communication is established I will issue an addendum to this report for documentation purposes. Electronically Signed: By: Van Clines M.D. On: 12/13/2015 17:25      Lab Results  Component Value Date   HGBA1C  01/24/2008    5.7 (NOTE)   The ADA recommends the following therapeutic goal for glycemic   control related to Hgb A1C measurement:   Goal of Therapy:   < 7.0% Hgb A1C   Reference: American Diabetes Association: Clinical Practice   Recommendations 2008, Diabetes Care,  2008, 31:(Suppl 1).   Lab Results  Component Value Date   LDLCALC 118 (H) 11/21/2012   CREATININE 0.83 12/15/2015       Scheduled Meds: . atorvastatin  40 mg Oral q1800  . budesonide  6 mg Oral q morning - 10a  . citalopram  10 mg Oral Daily  . darifenacin  7.5 mg Oral Daily  . feeding supplement (ENSURE ENLIVE)  237 mL Oral BID BM  . ferrous sulfate  325 mg Oral Q breakfast  . levETIRAcetam  250 mg Oral BID   . memantine  10 mg Oral BID  . sodium chloride flush  3 mL Intravenous Q12H  . tiotropium  1 capsule Inhalation Daily  . vancomycin  1,000 mg Intravenous Q48H   Continuous Infusions: . 0.9 % NaCl with KCl 20 mEq / L 75 mL/hr at 12/15/15 0527  . heparin 1,250 Units/hr (12/15/15 0337)     LOS: 2 days    Time spent: >30 MINS    [facility] Pager [phone]. If 7PM-7AM, please contact night-coverage at [email or web address], password [facility] 12/15/2015, 9:41 AM  LOS: 2 days

## 2015-12-15 NOTE — Progress Notes (Signed)
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 atorvastatin  40 mg Oral q1800  . budesonide  6 mg Oral q morning - 10a  . citalopram  10 mg Oral Daily  . darifenacin  7.5 mg Oral Daily  . feeding supplement (ENSURE ENLIVE)  237 mL Oral BID BM  . ferrous sulfate  325 mg Oral Q breakfast  . levETIRAcetam  250 mg Oral BID  . magnesium oxide  400 mg Oral BID  . memantine  10 mg Oral BID  . sodium chloride flush  3 mL Intravenous Q12H  . tiotropium  1 capsule Inhalation Daily  . vancomycin  1,000 mg Intravenous Q48H    Review of Systems: Review of Systems  Unable to perform ROS: Mental acuity    Past Medical History:  Diagnosis Date  . Anxiety   . Arthritis   . Attention to nephrostomy ([place])    PT HAS NEPHROSTOMY TUBE IN PLACE  . Bruises easily   . Cancer [facility])    Skin cancer  . Chronic diarrhea   . Colitis   . COPD (chronic obstructive pulmonary disease) ([place])   . Dementia   . Depression   . GERD (gastroesophageal reflux disease)   . Hiatal hernia   . Hx of bronchitis   . Hx of pulmonary edema JULY 2015  . Hx of pulmonary edema JULY 2015  . Hx of septic shock JULY 2015  . Hydronephrosis, left   . Hyperlipidemia   . Memory difficulties   . Microscopic colitis   . Myocardial infarction    unknown time   . Renal insufficiency   . Seizures ([place])    last seizure was 4-5 years ago with "brain bleed"  . Shortness of breath    with exertion  . Stroke [facility])    TIA four years  ago / stroke NOV 2014    Social History  Substance Use Topics  . Smoking status: Former Smoker    Packs/day: 1.00    Years: 50.00    Types: Cigarettes    Quit date: 11/13/1997  . Smokeless tobacco: Never Used  . Alcohol use 4.2 oz/week    7 Glasses of wine per week     Comment: 2 glasses a night wine (none in a year)    Family History  Problem Relation Age of Onset  . Diabetes Mother   . Stroke Mother   . Heart attack Father     OBJECTIVE: Vitals:   12/15/15 0400 12/15/15 0700 12/15/15 0744 12/15/15 1100  BP: 133/74 140/81 138/82 137/85  Pulse: (!) 106  100 (!) 39  Resp: (!) 23  (!) 24 (!) 21  Temp:   97.5 F (36.4 C)   TempSrc:   Axillary   SpO2: 99%  99% 100%  Weight:      Height:       Body mass index is 20.37  kg/m.  Physical Exam  Constitutional:  She was asleep when I entered the room. Her daughter, Lattie Haw, arrived and had difficulty waking her up. She was able to tell me that she is not having any pain in her right leg.  Cardiovascular: Normal rate and regular rhythm.   No murmur heard. Pulmonary/Chest: Effort normal and breath sounds normal. She has no wheezes. She has no rales.  Abdominal: Soft. There is no tenderness.  Skin:  There is some more redness and bruising of her right thigh medially but overall the erythema is much less intense. He remained slightly warm to touch but it is not painful with palpation.    Lab Results Lab Results  Component Value Date   WBC 14.9 (H) 12/15/2015   HGB 10.1 (L) 12/15/2015   HCT 31.2 (L) 12/15/2015   MCV 98.4 12/15/2015   PLT 219 12/15/2015    Lab Results  Component Value Date   CREATININE 0.83 12/15/2015   BUN 7 12/15/2015   NA 135 12/15/2015   K 3.4 (L) 12/15/2015   CL 108 12/15/2015   CO2 22 12/15/2015    Lab Results  Component Value Date   ALT 18 12/15/2015   AST 17 12/15/2015   ALKPHOS 45 12/15/2015   BILITOT 0.6 12/15/2015     Microbiology: Recent Results (from the past 240 hour(s))  Blood  Culture (routine x 2)     Status: None (Preliminary result)   Collection Time: 12/13/15 12:25 PM  Result Value Ref Range Status   Specimen Description BLOOD LEFT FOREARM  Final   Special Requests BOTTLES DRAWN AEROBIC AND ANAEROBIC 5CC  Final   Culture NO GROWTH 1 DAY  Final   Report Status PENDING  Incomplete  Blood Culture (routine x 2)     Status: None (Preliminary result)   Collection Time: 12/13/15  1:15 PM  Result Value Ref Range Status   Specimen Description BLOOD RIGHT ANTECUBITAL  Final   Special Requests BOTTLES DRAWN AEROBIC AND ANAEROBIC 5CC  Final   Culture NO GROWTH 1 DAY  Final   Report Status PENDING  Incomplete  MRSA PCR Screening     Status: None   Collection Time: 12/13/15  5:56 PM  Result Value Ref Range Status   MRSA by PCR NEGATIVE NEGATIVE Final    Comment:        The GeneXpert MRSA Assay (FDA approved for NASAL specimens only), is one component of a comprehensive MRSA colonization surveillance program. It is not intended to diagnose MRSA infection nor to guide or monitor treatment for MRSA infections.   Urine culture     Status: Abnormal   Collection Time: 12/13/15  6:08 PM  Result Value Ref Range Status   Specimen Description URINE, RANDOM  Final   Special Requests NONE  Final   Culture MULTIPLE SPECIES PRESENT, SUGGEST RECOLLECTION (A)  Final   Report Status 12/14/2015 FINAL  Final     ASSESSMENT: She is improving on vancomycin for possible recurrent cellulitis.  PLAN: 1. Continue vancomycin  Michel Bickers, MD [facility] for Infectious [place] [facility] [phone] pager   [phone] cell 12/15/2015, 12:22 PM

## 2015-12-15 NOTE — Progress Notes (Signed)
ANTICOAGULATION CONSULT NOTE - Follow Up Consult  Pharmacy Consult for Heparin  Indication: DVT  Patient Measurements: Height: 5\' 1"  (154.9 cm) Weight: 106 lb 11.2 oz (48.4 kg) IBW/kg (Calculated) : 47.8  Vital Signs: Temp: 98.5 F (36.9 C) (12/09 2335) Temp Source: Oral (12/09 2335) BP: 132/75 (12/09 2335) Pulse Rate: 106 (12/09 2335)  Labs:  Recent Labs  12/13/15 1158 12/13/15 1600 12/14/15 0307 12/14/15 1239 12/15/15 0131  HGB 12.3  --  9.0*  --  9.4*  HCT 37.8  --  27.1*  --  28.9*  PLT 287  --  209  --  193  APTT  --  31  --   --   --   LABPROT  --  15.8*  --   --   --   INR  --  1.25  --   --   --   HEPARINUNFRC  --   --  <0.10* 0.29* 0.24*  CREATININE 1.22*  --  0.83  --  0.83    Estimated Creatinine Clearance: 37.4 mL/min (by C-G formula based on SCr of 0.83 mg/dL).  Assessment: Heparin for RLE DVT, heparin level remains low despite rate increase  Goal of Therapy:  Heparin level 0.3-0.7 units/ml Monitor platelets by anticoagulation protocol: Yes   Plan:  -Inc heparin to 1250 units/hr -1130 HL  Ariell Gunnels 12/15/2015,2:59 AM

## 2015-12-15 NOTE — Progress Notes (Signed)
ANTICOAGULATION CONSULT NOTE  Pharmacy Consult for heparin Indication:  DVT  Patient Measurements: Height: 5\' 1"  (154.9 cm) Weight: 107 lb 12.8 oz (48.9 kg) IBW/kg (Calculated) : 47.8 Heparin Dosing Weight: 47 kg  Vital Signs: Temp: 97.5 F (36.4 C) (12/10 0744) Temp Source: Axillary (12/10 0744) BP: 137/85 (12/10 1100) Pulse Rate: 39 (12/10 1100)  Labs:  Recent Labs  12/13/15 1158 12/13/15 1600  12/14/15 0307 12/14/15 1239 12/15/15 0131 12/15/15 1044  HGB 12.3  --   --  9.0*  --  9.4* 10.1*  HCT 37.8  --   --  27.1*  --  28.9* 31.2*  PLT 287  --   --  209  --  193 219  APTT  --  31  --   --   --   --   --   LABPROT  --  15.8*  --   --   --   --   --   INR  --  1.25  --   --   --   --   --   HEPARINUNFRC  --   --   < > <0.10* 0.29* 0.24* 0.53  CREATININE 1.22*  --   --  0.83  --  0.83  --   < > = values in this interval not displayed.  Estimated Creatinine Clearance: 37.4 mL/min (by C-G formula based on SCr of 0.83 mg/dL).  Assessment: 82 y.o. female with RLE DVT continues on heparin Heparin level = 0.53 this afternoon  Goal of Therapy:  Heparin level 0.3-0.7 units/ml Monitor platelets by anticoagulation protocol: Yes   Plan:  Continue heparin at 1250 units / hr AM heparin level  Thank you  Anette Guarneri, PharmD [phone] 12/15/2015,12:14 PM

## 2015-12-15 NOTE — Progress Notes (Signed)
**  Preliminary report by tech**  Carotid artery duplex complete. The study was technically limited due to movement, depth of vessels, poor patient cooperation, respiratory disturbance, and acoustic shadow.  Findings are consistent with a 1-39 percent stenosis involving the right internal carotid artery and the left internal carotid artery. The vertebral arteries demonstrate antegrade flow.   Right Lower Extremity Vein Map  Right Great Saphenous Vein   Segment Diameter Comment  1. Origin 4 mm Branch  2. High Thigh 5.5 mm Branch  3. Mid Thigh 5.2 mm   4. Low Thigh 4.7 mm   5. At Knee 4.8 mm   6. High Calf 5.1 mm Branch  7. Low Calf 3.8 mm Branch  8. Ankle 1.8 mm Branch   Left Great Saphenous Vein   Segment Diameter Comment  1. Origin 6 mm   2. High Thigh 3.8 mm   3. Mid Thigh 3.7 mm   4. Low Thigh 3.3 mm   5. At Knee 2.9 mm   6. High Calf 2.9 mm Branch  7. Low Calf 2 mm   8. Ankle 3.4 mm Branch

## 2015-12-16 ENCOUNTER — Inpatient Hospital Stay (HOSPITAL_COMMUNITY): Payer: Medicare Other

## 2015-12-16 DIAGNOSIS — Z01810 Encounter for preprocedural cardiovascular examination: Secondary | ICD-10-CM

## 2015-12-16 DIAGNOSIS — D500 Iron deficiency anemia secondary to blood loss (chronic): Secondary | ICD-10-CM

## 2015-12-16 DIAGNOSIS — I82431 Acute embolism and thrombosis of right popliteal vein: Secondary | ICD-10-CM

## 2015-12-16 LAB — CBC
HEMATOCRIT: 28.2 % — AB (ref 36.0–46.0)
HEMOGLOBIN: 9.3 g/dL — AB (ref 12.0–15.0)
MCH: 31.7 pg (ref 26.0–34.0)
MCHC: 33 g/dL (ref 30.0–36.0)
MCV: 96.2 fL (ref 78.0–100.0)
Platelets: 225 10*3/uL (ref 150–400)
RBC: 2.93 MIL/uL — ABNORMAL LOW (ref 3.87–5.11)
RDW: 15.5 % (ref 11.5–15.5)
WBC: 10.9 10*3/uL — AB (ref 4.0–10.5)

## 2015-12-16 LAB — BASIC METABOLIC PANEL
Anion gap: 7 (ref 5–15)
BUN: 11 mg/dL (ref 6–20)
CALCIUM: 8.1 mg/dL — AB (ref 8.9–10.3)
CO2: 20 mmol/L — AB (ref 22–32)
CREATININE: 0.69 mg/dL (ref 0.44–1.00)
Chloride: 111 mmol/L (ref 101–111)
GFR calc non Af Amer: >60 mL/min (ref >60)
GLUCOSE: 117 mg/dL — AB (ref 65–99)
Potassium: 3.9 mmol/L (ref 3.5–5.1)
Sodium: 138 mmol/L (ref 135–145)

## 2015-12-16 LAB — ECHOCARDIOGRAM COMPLETE
HEIGHTINCHES: 61 in
WEIGHTICAEL: 1728 [oz_av]

## 2015-12-16 LAB — HEPARIN LEVEL (UNFRACTIONATED): Heparin Unfractionated: 0.52 IU/mL (ref 0.30–0.70)

## 2015-12-16 LAB — MAGNESIUM: Magnesium: 1.8 mg/dL (ref 1.7–2.4)

## 2015-12-16 MED ORDER — VANCOMYCIN HCL 500 MG IV SOLR
500.0000 mg | INTRAVENOUS | Status: AC
  Administered 2015-12-16 – 2015-12-20 (×5): 500 mg via INTRAVENOUS
  Filled 2015-12-16 (×5): qty 500

## 2015-12-16 NOTE — Progress Notes (Signed)
INFECTIOUS DISEASE PROGRESS NOTE  ID: Bridget Ball is a 82 y.o. 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  Greets me enthusiastically.   Abtx:  Anti-infectives    Start     Dose/Rate Route Frequency Ordered Stop   12/16/15 2300  vancomycin (VANCOCIN) 500 mg in sodium chloride 0.9 % 100 mL IVPB     500 mg 100 mL/hr over 60 Minutes Intravenous Every 24 hours 12/16/15 1132     12/15/15 1400  vancomycin (VANCOCIN) IVPB 1000 mg/200 mL premix  Status:  Discontinued     1,000 mg 200 mL/hr over 60 Minutes Intravenous Every 48 hours 12/13/15 1328 12/16/15 1132   12/13/15 2300  aztreonam (AZACTAM) 1 g in dextrose 5 % 50 mL IVPB  Status:  Discontinued     1 g 100 mL/hr over 30 Minutes Intravenous Every 8 hours 12/13/15 1451 12/14/15 1526   12/13/15 2200  aztreonam (AZACTAM) 1 g in dextrose 5 % 50 mL IVPB  Status:  Discontinued     1 g 100 mL/hr over 30 Minutes Intravenous Every 8 hours 12/13/15 1321 12/13/15 1451   12/13/15 1330  metroNIDAZOLE (FLAGYL) IVPB 500 mg  Status:  Discontinued     500 mg 100 mL/hr over 60 Minutes Intravenous Every 8 hours 12/13/15 1304 12/14/15 1526   12/13/15 1330  vancomycin (VANCOCIN) IVPB 1000 mg/200 mL premix     1,000 mg 200 mL/hr over 60 Minutes Intravenous  Once 12/13/15 1306 12/13/15 1645   12/13/15 1315  aztreonam (AZACTAM) 1 g in dextrose 5 % 50 mL IVPB     1 g 100 mL/hr over 30 Minutes Intravenous  Once 12/13/15 1301 12/13/15 1549   12/13/15 1300  aztreonam (AZACTAM) 2 g in dextrose 5 % 50 mL IVPB  Status:  Discontinued      2 g 100 mL/hr over 30 Minutes Intravenous  Once 12/13/15 1251 12/13/15 1300   12/13/15 1300  vancomycin (VANCOCIN) IVPB 1000 mg/200 mL premix  Status:  Discontinued     1,000 mg 200 mL/hr over 60 Minutes Intravenous  Once 12/13/15 1251 12/13/15 1306      Medications:  Scheduled: . atorvastatin  40 mg Oral q1800  . budesonide  6 mg Oral q morning - 10a  . citalopram  10 mg Oral Daily  . darifenacin  7.5 mg Oral Daily  . feeding supplement (ENSURE ENLIVE)  237 mL Oral BID BM  . ferrous sulfate  325 mg Oral Q breakfast  . levETIRAcetam  250 mg Oral BID  . magnesium oxide  400 mg Oral BID  . memantine  10 mg Oral BID  . sodium chloride flush  3 mL Intravenous Q12H  . tiotropium  1 capsule Inhalation Daily  . vancomycin  500 mg Intravenous Q24H    Objective: Vital signs in last 24 hours: Temp:  [98.2 F (36.8 C)] 98.2 F (36.8 C) (12/11 1148) Pulse Rate:  [78-107] 107 (12/11 1148) Resp:  [18-33] 31 (12/11 1148) BP: (114-145)/(71-85) 114/71 (12/11 1148) SpO2:  [92 %-100 %] 94 % (  12/11 1148) Weight:  [49 kg (108 lb)] 49 kg (108 lb) (12/11 0000)   General appearance: alert, cooperative and no distress Resp: clear to auscultation bilaterally Cardio: regular rate and rhythm GI: normal findings: bowel sounds normal and soft, non-tender Extremities: RLE- decreased (per daughter) erythema in calf area. thigh has increased erythema, warmth.   Lab Results  Recent Labs  12/15/15 0131 12/15/15 1044 12/16/15 0458  WBC 15.2* 14.9* 10.9*  HGB 9.4* 10.1* 9.3*  HCT 28.9* 31.2* 28.2*  NA 135  --  138  K 3.4*  --  3.9  CL 108  --  111  CO2 22  --  20*  BUN 7  --  11  CREATININE 0.83  --  0.69   Liver Panel  Recent Labs  12/14/15 0307 12/15/15 0131  PROT 4.4* 5.1*  ALBUMIN 2.4* 2.6*  AST 15 17  ALT 14 18  ALKPHOS 30* 45  BILITOT 0.9 0.6   Sedimentation Rate No results for input(s): ESRSEDRATE in the last 72 hours. C-Reactive Protein No results for input(s): CRP  in the last 72 hours.  Microbiology: Recent Results (from the past 240 hour(s))  Blood Culture (routine x 2)     Status: None (Preliminary result)   Collection Time: 12/13/15 12:25 PM  Result Value Ref Range Status   Specimen Description BLOOD LEFT FOREARM  Final   Special Requests BOTTLES DRAWN AEROBIC AND ANAEROBIC 5CC  Final   Culture NO GROWTH 3 DAYS  Final   Report Status PENDING  Incomplete  Blood Culture (routine x 2)     Status: None (Preliminary result)   Collection Time: 12/13/15  1:15 PM  Result Value Ref Range Status   Specimen Description BLOOD RIGHT ANTECUBITAL  Final   Special Requests BOTTLES DRAWN AEROBIC AND ANAEROBIC 5CC  Final   Culture NO GROWTH 3 DAYS  Final   Report Status PENDING  Incomplete  MRSA PCR Screening     Status: None   Collection Time: 12/13/15  5:56 PM  Result Value Ref Range Status   MRSA by PCR NEGATIVE NEGATIVE Final    Comment:        The GeneXpert MRSA Assay (FDA approved for NASAL specimens only), is one component of a comprehensive MRSA colonization surveillance program. It is not intended to diagnose MRSA infection nor to guide or monitor treatment for MRSA infections.   Urine culture     Status: Abnormal   Collection Time: 12/13/15  6:08 PM  Result Value Ref Range Status   Specimen Description URINE, RANDOM  Final   Special Requests NONE  Final   Culture MULTIPLE SPECIES PRESENT, SUGGEST RECOLLECTION (A)  Final   Report Status 12/14/2015 FINAL  Final    Studies/Results: No results found.   Assessment/Plan: Popliteal Artery Aneurysm DVT Cellulitis RLE  Total days of antibiotics: 4 vanco  Will continue her vanco Explained inflammatory response to her daughter.  Some improvement, question if worsening of thigh is due to dependence.          Bobby Rumpf Infectious Diseases (pager) [phone] [email or web address] 12/16/2015, 4:33 PM  LOS: 3 days

## 2015-12-16 NOTE — Progress Notes (Signed)
  Echocardiogram 2D Echocardiogram has been performed.  Bridget Ball 12/16/2015, 1:11 PM

## 2015-12-16 NOTE — Progress Notes (Signed)
ANTICOAGULATION CONSULT NOTE  Pharmacy Consult for heparin Indication:  DVT  Patient Measurements: Height: 5\' 1"  (154.9 cm) Weight: 108 lb (49 kg) IBW/kg (Calculated) : 47.8 Heparin Dosing Weight: 47 kg  Vital Signs: Temp: 98.2 F (36.8 C) (12/11 0300) Temp Source: Oral (12/11 0300) BP: 141/85 (12/11 0400) Pulse Rate: 88 (12/11 0400)  Labs:  Recent Labs  12/13/15 1600 12/14/15 0307  12/15/15 0131 12/15/15 1044 12/16/15 0458 12/16/15 0904  HGB  --  9.0*  --  9.4* 10.1* 9.3*  --   HCT  --  27.1*  --  28.9* 31.2* 28.2*  --   PLT  --  209  --  193 219 225  --   APTT 31  --   --   --   --   --   --   LABPROT 15.8*  --   --   --   --   --   --   INR 1.25  --   --   --   --   --   --   HEPARINUNFRC  --  <0.10*  < > 0.24* 0.53  --  0.52  CREATININE  --  0.83  --  0.83  --  0.69  --   < > = values in this interval not displayed.  Estimated Creatinine Clearance: 38.8 mL/min (by C-G formula based on SCr of 0.69 mg/dL).  Assessment: 82 y.o. female with right popliteal artery aneurysm and DVT. Pharmacy has been consulted for heparin dosing. Patient now has 2 therapeutic levels on 1250 units/hr. CBC is stable and no signs of bleeding have been noted in the chart.   Goal of Therapy:  Heparin level 0.3-0.7 units/ml Monitor platelets by anticoagulation protocol: Yes   Plan:  Continue heparin at 1250 units / hr Daily heparin level and CBC Monitor for signs and symptoms of bleeding.   Uvaldo Bristle, PharmD PGY1 Pharmacy Resident  Pager: [phone]  12/16/2015,10:00 AM

## 2015-12-16 NOTE — Consult Note (Signed)
Cardiology Consult    Patient ID: NEVAEN TRAMA MRN: [id number], DOB/AGE: 04/25/1928   Admit date: 12/13/2015 Date of Consult: 12/16/2015  Primary Physician: [name], MD Reason for Consult: Pre op Primary Cardiologist: New  Requesting Provider: Dr. Trula Slade   Patient Profile    Ms. Buendia is a 82 year old female with a past medical history of COPD, dementia, anemia,CVA in 2014, chronic renal insufficiency and dyslipidemia. She presented to [facility] on 12/13/15 with significant right lower extremity erythema, edema and pain. 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 Vascular surgery consulted, needs cardiac clearance for surgery.   History of Present Illness    Ms. Ideker does not have a cardiac history. She does have a family history of CAD, with her father having an MI at the age of 19.   She lives in her son's house and is able to preform all of her ADL's on her own. She has stairs in her house and does get SOB when she has to climb them. She says that she used to love walking but gets more SOB with activity now. She is able to walk around in the grocery store without SOB or fatigue.   She denies chest pain with activity, says that she has never had chest pain. Vascular surgery plans for surgery later this week to address her SFA/popliteal aneurysm. Currently she is on IV antibiotics for right lower extremity cellulitis as well.     Past Medical History   Past Medical History:  Diagnosis Date  . Anxiety   . Arthritis   . Attention to nephrostomy ([place])    PT HAS NEPHROSTOMY TUBE IN PLACE  . Bruises easily   . Cancer [facility])    Skin cancer  . Chronic diarrhea   . Colitis   . COPD (chronic obstructive pulmonary disease) ([place])   . Dementia   . Depression   . GERD (gastroesophageal reflux disease)   . Hiatal hernia   . Hx of bronchitis   . Hx of  pulmonary edema JULY 2015  . Hx of pulmonary edema JULY 2015  . Hx of septic shock JULY 2015  . Hydronephrosis, left   . Hyperlipidemia   . Memory difficulties   . Microscopic colitis   . Myocardial infarction    unknown time   . Renal insufficiency   . Seizures ([place])    last seizure was 4-5 years ago with "brain bleed"  . Shortness of breath    with exertion  . Stroke [facility])    TIA four years ago / stroke NOV 2014    Past Surgical History:  Procedure Laterality Date  . APPENDECTOMY    . BREAST BIOPSY Right 02/02/2014   Procedure: RIGHT BREAST BIOPSY;  Surgeon: Jamesetta So, MD;  Location: [facility] ORS;  Service: General;  Laterality: Right;  . COLONOSCOPY N/A 05/24/2013   Procedure: COLONOSCOPY;  Surgeon: Rogene Houston, MD;  Location: [facility] ENDO SUITE;  Service: Endoscopy;  Laterality: N/A;  100  . CYSTOSCOPY W/ URETERAL STENT PLACEMENT Left 08/06/2015   Procedure: CYSTOSCOPY WITH LEFT URETERAL STENT EXCHANGE - Sammie Bench;  Surgeon: Franchot Gallo, MD;  Location: [facility] ORS;  Service: Urology;  Laterality: Left;  . CYSTOSCOPY WITH RETROGRADE PYELOGRAM, URETEROSCOPY AND STENT PLACEMENT Left 09/18/2013   Procedure: CYSTOSCOPY WITH RETROGRADE PYELOGRAM,  URETEROSCOPY AND  STENT PLACEMENT, removal of nephrostomy tube;  Surgeon: Lillette Boxer  Dahlstedt, MD;  Location: [facility] ORS;  Service: Urology;  Laterality: Left;  . CYSTOSCOPY WITH STENT PLACEMENT Left 09/04/2014   Procedure: CYSTOSCOPY WITH LEFT J2 STENT EXCHANGE;  Surgeon: Franchot Gallo, MD;  Location: [facility];  Service: Urology;  Laterality: Left;  . ESOPHAGOGASTRODUODENOSCOPY N/A 05/24/2013   Procedure: ESOPHAGOGASTRODUODENOSCOPY (EGD);  Surgeon: Rogene Houston, MD;  Location: [facility] SU[facility];  Service: Endoscopy;  Laterality: N/A;  . KNEE ARTHROSCOPY Right   . SKIN CANCER EXCISION    . TONSILLECTOMY    . TUBAL LIGATION       Allergies: PCN   Inpatient Medications    . atorvastatin  40 mg Oral q1800  . budesonide  6 mg Oral q morning - 10a    . citalopram  10 mg Oral Daily  . darifenacin  7.5 mg Oral Daily  . feeding supplement (ENSURE ENLIVE)  237 mL Oral BID BM  . ferrous sulfate  325 mg Oral Q breakfast  . levETIRAcetam  250 mg Oral BID  . magnesium oxide  400 mg Oral BID  . memantine  10 mg Oral BID  . sodium chloride flush  3 mL Intravenous Q12H  . tiotropium  1 capsule Inhalation Daily  . vancomycin  1,000 mg Intravenous Q48H    Family History    Family History  Problem Relation Age of Onset  . Diabetes Mother   . Stroke Mother   . Heart attack Father     Social History    Social History   Social History  . Marital status: Widowed    Spouse name: N/A  . Number of children: 6  . Years of education: college   Occupational History  . retired    Social History Main Topics  . Smoking status: Former Smoker    Packs/day: 1.00    Years: 50.00    Types: Cigarettes    Quit date: 11/13/1997  . Smokeless tobacco: Never Used  . Alcohol use 4.2 oz/week    7 Glasses of wine per week     Comment: 2 glasses a night wine (none in a year)  . Drug use: No  . Sexual activity: Not Currently   Other Topics Concern  . Not on file   Social History Narrative   Patient is widowed with 6 children.   Patient is right handed.   Patient has college education.   Patient drinks 4 cups daily.     Review of Systems    General:  No chills, fever, night sweats or weight changes.  Cardiovascular:  No chest pain, dyspnea on exertion, edema, orthopnea, palpitations, paroxysmal nocturnal dyspnea. Dermatological: No rash, lesions/masses Respiratory: No cough, dyspnea Urologic: No hematuria, dysuria Abdominal:   No nausea, vomiting, diarrhea, bright red blood per rectum, melena, or hematemesis Neurologic:  No visual changes, wkns, changes in mental status. All other systems reviewed and are otherwise negative except as noted above.  Physical Exam    Blood pressure (!) 141/85, pulse 88, temperature 98.2 F (36.8 C),  temperature source Oral, resp. rate 20, height 5\' 1"  (1.549 m), weight 108 lb (49 kg), SpO2 100 %.  General: Elderly female  Psych: Normal affect. Neuro: Alert and oriented X 3. Moves all extremities spontaneously. HEENT: Normal  Neck: Supple without bruits or JVD. Lungs:  Resp regular and unlabored, CTA. Heart: RRR no s3, s4, or murmurs. Abdomen: Soft, non-tender, non-distended, BS + x 4.  Extremities: Right lower extremity erythema.   Lab Results  Component Value Date  WBC 10.9 (H) 12/16/2015   HGB 9.3 (L) 12/16/2015   HCT 28.2 (L) 12/16/2015   MCV 96.2 12/16/2015   PLT 225 12/16/2015    Recent Labs Lab 12/15/15 0131 12/16/15 0458  NA 135 138  K 3.4* 3.9  CL 108 111  CO2 22 20*  BUN 7 11  CREATININE 0.83 0.69  CALCIUM 8.1* 8.1*  PROT 5.1*  --   BILITOT 0.6  --   ALKPHOS 45  --   ALT 18  --   AST 17  --   GLUCOSE 89 117*   Lab Results  Component Value Date   CHOL 206 (H) 11/21/2012   HDL 62 11/21/2012   LDLCALC 118 (H) 11/21/2012   TRIG 129 11/21/2012   Lab Results  Component Value Date   DDIMER 10.60 (H) 07/28/2013     Radiology Studies    Ct Femur Right W Contrast  Addendum Date: 12/13/2015   ADDENDUM REPORT: 12/13/2015 17:58 ADDENDUM: The original report was by Dr. Van Clines. The following addendum is by Dr. Van Clines: These results were called by telephone at the time of interpretation on 12/13/2015 at 5:56 pm to Dr. Doren Custard HOBBS, who verbally acknowledged these results. Electronically Signed   By: Van Clines M.D.   On: 12/13/2015 17:58   Result Date: 12/13/2015 CLINICAL DATA:  Right leg cellulitis, worsening.  Arthritis. EXAM: CT OF THE RIGHT FEMUR WITHOUT CONTRAST CT OF THE RIGHT TIBIA/ FIBULA WITHOUT CONTRAST TECHNIQUE: Multidetector CT imaging of the lower right extremity was performed according to the standard protocol following intravenous contrast administration. COMPARISON:  None. CONTRAST:  1mL ISOVUE-300 IOPAMIDOL  (ISOVUE-300) INJECTION 61% FINDINGS: RIGHT FEMUR: No findings of active osteomyelitis. Moderate osteoarthritis of the knee with tricompartmental spurring and a trace knee effusion. There is a left ureteral stent extending into the urinary bladder. Small left inguinal lymph nodes are present. Atherosclerotic calcification in the common femoral artery and superficial femoral artery ; there is a saccular aneurysm of the SFA centered about 12 cm above the joint line of the knee, mild surrounding stranding, the aneurysm measures 3.3 by 2.8 by 2.7 cm and is partially thrombosed. There is partially occlusive deep vein thrombosis in the right popliteal vein extending down into the tibial veins. Subcutaneous edema laterally in the distal thigh. No abscess identified. RIGHT TIBIA/FIBULA: No findings of osteomyelitis. Subcutaneous edema fairly circumferentially in the calf and extending down to the ankle where it is again circumferential. This could reflect cellulitis but is nonspecific and there is no abscess or gas tracking in the soft tissues. No abnormal effacement of fat planes along the calf musculature. Aside from the clot in the popliteal vein, there is branching clot extending in the proximal aspects of the anterior tibial vein and posterior tibial vein. Atherosclerotic calcification of arterial structures noted, patent to the arterial structures is difficult to assess as today' s exam was not performed as an angiogram type of study, correlation with distal pulses recommended. IMPRESSION: 1. 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 At the upper margin of this aneurysm there is some narrowing of the artery. Urgent vascular surgical consultation recommended for further workup. 2. Likely acute popliteal vein partially occlusive DVT extending into the anterior and posterior tibial veins. This was not shown on the prior ultrasound exam from  11/25/2015. 3. Circumferential subcutaneous edema in the calf and ankle, along with some edema in the subcutaneous tissues of the lateral thigh.  Although cellulitis could possibly give this appearance, the appearance could also be related to the DVT, venous stasis, or other conditions. No abscess or findings suggesting myositis/fasciitis. 4. Moderate osteoarthritis of the knee with small knee effusion. Radiology assistant personnel have been notified to put me in telephone contact with the referring physician or the referring physician's clinical representative in order to discuss these findings. Once this communication is established I will issue an addendum to this report for documentation purposes. Electronically Signed: By: Van Clines M.D. On: 12/13/2015 17:25   Ct Tibia Fibula Right W Contrast  Addendum Date: 12/13/2015   ADDENDUM REPORT: 12/13/2015 17:58 ADDENDUM: The original report was by Dr. Van Clines. The following addendum is by Dr. Van Clines: These results were called by telephone at the time of interpretation on 12/13/2015 at 5:56 pm to Dr. Doren Custard HOBBS, who verbally acknowledged these results. Electronically Signed   By: Van Clines M.D.   On: 12/13/2015 17:58   Result Date: 12/13/2015 CLINICAL DATA:  Right leg cellulitis, worsening.  Arthritis. EXAM: CT OF THE RIGHT FEMUR WITHOUT CONTRAST CT OF THE RIGHT TIBIA/ FIBULA WITHOUT CONTRAST TECHNIQUE: Multidetector CT imaging of the lower right extremity was performed according to the standard protocol following intravenous contrast administration. COMPARISON:  None. CONTRAST:  64mL ISOVUE-300 IOPAMIDOL (ISOVUE-300) INJECTION 61% FINDINGS: RIGHT FEMUR: No findings of active osteomyelitis. Moderate osteoarthritis of the knee with tricompartmental spurring and a trace knee effusion. There is a left ureteral stent extending into the urinary bladder. Small left inguinal lymph nodes are present. Atherosclerotic calcification  in the common femoral artery and superficial femoral artery ; there is a saccular aneurysm of the SFA centered about 12 cm above the joint line of the knee, mild surrounding stranding, the aneurysm measures 3.3 by 2.8 by 2.7 cm and is partially thrombosed. There is partially occlusive deep vein thrombosis in the right popliteal vein extending down into the tibial veins. Subcutaneous edema laterally in the distal thigh. No abscess identified. RIGHT TIBIA/FIBULA: No findings of osteomyelitis. Subcutaneous edema fairly circumferentially in the calf and extending down to the ankle where it is again circumferential. This could reflect cellulitis but is nonspecific and there is no abscess or gas tracking in the soft tissues. No abnormal effacement of fat planes along the calf musculature. Aside from the clot in the popliteal vein, there is branching clot extending in the proximal aspects of the anterior tibial vein and posterior tibial vein. Atherosclerotic calcification of arterial structures noted, patent to the arterial structures is difficult to assess as today' s exam was not performed as an angiogram type of study, correlation with distal pulses recommended. IMPRESSION: 1. 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 At the upper margin of this aneurysm there is some narrowing of the artery. Urgent vascular surgical consultation recommended for further workup. 2. Likely acute popliteal vein partially occlusive DVT extending into the anterior and posterior tibial veins. This was not shown on the prior ultrasound exam from 11/25/2015. 3. Circumferential subcutaneous edema in the calf and ankle, along with some edema in the subcutaneous tissues of the lateral thigh. Although cellulitis could possibly give this appearance, the appearance could also be related to the DVT, venous stasis, or other conditions. No abscess or findings suggesting  myositis/fasciitis. 4. Moderate osteoarthritis of the knee with small knee effusion. Radiology assistant personnel have been notified to put me in telephone contact with the referring physician or the referring physician's  clinical representative in order to discuss these findings. Once this communication is established I will issue an addendum to this report for documentation purposes. Electronically Signed: By: Van Clines M.D. On: 12/13/2015 17:25   US Venous Img Lower Unilateral Right  Result Date: 11/25/2015 CLINICAL DATA:  Acute on chronic cellulitis EXAM: RIGHT LOWER EXTREMITY VENOUS DOPPLER ULTRASOUND TECHNIQUE: Gray-scale sonography with compression, as well as color and duplex ultrasound, were performed to evaluate the deep venous system from the level of the common femoral vein through the popliteal and proximal calf veins. COMPARISON:  11/03/2015 FINDINGS: Normal compressibility of the common femoral, superficial femoral, and popliteal veins, as well as the proximal calf veins. No filling defects to suggest DVT on grayscale or color Doppler imaging. Doppler waveforms show normal direction of venous flow, normal respiratory phasicity and response to augmentation. Visualized segments of the saphenous venous system normal in caliber and compressibility. Survey views of the contralateral common femoral vein are unremarkable. IMPRESSION: No evidence of  lower extremity deep vein thrombosis, right. Electronically Signed   By: Lucrezia Europe M.D.   On: 11/25/2015 10:51   US Arterial Seg Single  Result Date: 11/25/2015 CLINICAL DATA:  Bilateral toe ulcers. Right lower extremity cellulitis. EXAM: NONINVASIVE PHYSIOLOGIC VASCULAR STUDY OF BILATERAL LOWER EXTREMITIES TECHNIQUE: Evaluation of both lower extremities were performed at rest, including calculation of ankle-brachial indices with single level Doppler, pressure and pulse volume recording. COMPARISON:  None. FINDINGS: Right ABI:  1.46 Left ABI:   1.80 Right Lower Extremity: Normal triphasic Doppler waveform in the right posterior tibial artery. Left Lower Extremity: Triphasic Doppler waveforms in the left ankle. IMPRESSION: Ankle-brachial indices are markedly elevated. Findings suggest heavily calcified arteries. No significant abnormality involving the ankle waveforms. Electronically Signed   By: Markus Daft M.D.   On: 11/25/2015 10:26    EKG & Cardiac Imaging    EKG: NSR   Echocardiogram: Last was in 2015, LV EF 0000000, grade 1 diastolic dysfunction.   Assessment & Plan    1. Evaluation of cardiac risk for surgery: Does not describe ischemic symptoms at this time. She does have some SOB with activity, but hard to determine if this is due to deconditioning or her COPD.   EKG non ischemic. Risk factors for CAD include prior smoking history, family history, age, and HTN. Will put in order for Echo to evaluate for wall motion abnormality, but with no ischemic symptoms would not recommend any ischemic evaluation.   Further MD recommendations to follow.     Signed, Arbutus Leas, NP 12/16/2015, 10:32 AM Pager: [phone]   I have seen, examined and evaluated the patient this AM along with Jettie Booze, NP.  After reviewing all the available data and chart, we discussed the patients laboratory, study & physical findings as well as symptoms in detail. I agree with her findings, examination as well as impression recommendations as per our discussion.    82 year old woman with no prior cardiac history who sense with essentially ischemic right leg in part related to pseudoaneurysm of the right superficial femoral artery and associated DVT. She needs surgical repair. This is a relatively acute procedure and would likely benefit from not being delayed by any further evaluation.  Unfortunately with deconditioning she is not all that active, but has not had any angina or heart failure symptoms. Her only cardiovascular risk factor is the fact  she had a TIA couple years ago. She's never had diabetes does not take insulin, no active heart failure  or anginal symptoms.   An extraperitoneal fashion for surgery was still not be considered high risk surgery from a cardiac standpoint. Check an echocardiogram may be helpful just to get a better assessment of her overall cardiac function, but I would not her necessary surgery for any ischemic evaluation with stress test. It would not change her management, as I would not consider PCI in a patient without symptoms.  We will be available postoperatively the complications do occur.   Glenetta Hew, M.D., M.S. Interventional Cardiologist   Pager # [phone] Phone # [phone] [address] [place], [place] [address]

## 2015-12-16 NOTE — Plan of Care (Signed)
Problem: Safety: Goal: Ability to remain free from injury will improve Outcome: Progressing Discussed with the patient importance of using the call bell to reach items and call for the bathroom with some teach back displayed.

## 2015-12-16 NOTE — Progress Notes (Addendum)
Pharmacy Antibiotic Note  Bridget Ball is a 82 y.o. female admitted on 12/13/2015 with recurrent  cellulitis.  Pharmacy has been consulted for vancomycin dosing.Patient previously completed a course of IV antibiotics on 11/23  (vanc and aztreonam). She then completed 5 days of Bactrim. WBC count is down to 10.9 and patient is afebrile. Patient has low body mass and creatinine is 0.69. CrCl ~39 ml/min. ID is following patient and antibiotics narrowed to vancomycin based on their recommendations.   Plan: Adjust Vancomycin to 500 mg every 24 hours due to improved renal function Monitor length of therapy, ability to narrow, and renal function.   Height: 5\' 1"  (154.9 cm) Weight: 108 lb (49 kg) IBW/kg (Calculated) : 47.8  Temp (24hrs), Avg:98.3 F (36.8 C), Min:98.2 F (36.8 C), Max:98.4 F (36.9 C)   Recent Labs Lab 12/13/15 1158 12/13/15 1217 12/13/15 1628 12/14/15 0307 12/15/15 0131 12/15/15 1044 12/16/15 0458  WBC 13.3*  --   --  12.5* 15.2* 14.9* 10.9*  CREATININE 1.22*  --   --  0.83 0.83  --  0.69  LATICACIDVEN  --  2.39* 2.02*  --   --   --   --     Estimated Creatinine Clearance: 38.8 mL/min (by C-G formula based on SCr of 0.69 mg/dL).     Antimicrobials this admission: 12/8 Vanc>> 12/8 Aztreonam>>12/9 12/8 Flagyl>>12/9  Dose adjustments this admission: 12/11- Vanc 1 gm every 48 hours >> Vanc 500 mg every 24 hours   Microbiology results: 12/8 BCx: NGTD 12/8 [id number] species, recollect 12/8 MRSA PCR Negative   Thank you for allowing pharmacy to be a part of this patient's care.  Ihor Austin, PharmD PGY1 Pharmacy Resident [phone] 12/16/2015 10:06 AM

## 2015-12-16 NOTE — Progress Notes (Addendum)
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 female with medical history significant for COPD, dementia, history of seizures secondary to prior subarachnoid hemorrhage, anemia, dyslipidemia, GERD who was recently discharged on 11/23 from [facility] after being treated for right lower extremity cellulitis.In the ER the patient had relative hypotension with sepsis physiology criteria met therefore ER initiated fluid challenges and other sepsis protocol orders.   Assessment and plan Sepsis affecting skin /Recurrent Cellulitis of right lower leg -Patient presents with rapid onset and progression of significant right lower extremity erythema and edema with pain disproportionate to clinical findings; this is concerning for possible soft tissue necrotic process -Discussed with orthopedic physician/ Dr [name]  , no evidence of necrotizing fasciitis Antibiotics narrowed as per infectious disease recommendations-Dr. Megan Salon Cortisol level within normal limits. 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 Trula Slade, continue heparin drip pending plans for further intervention Vascular to address  SFA/Pop aneurysm, later in the week  [name] a 2-D echo, cardiology clearance is requested     Dehydration/ AKI (acute kidney injury)  -Baseline renal function on 11/22: 17/0.77 -Current renal function: 13/1.22 Creatinine is now 0.69    Seizure disorder secondary to prior [place] -Continue preadmission Keppra -If   NPO we'll need to convert to IV dosing -Seizure precautions    COPD (chronic obstructive pulmonary disease) -Currently stable without wheezing  -Continue oral budesonide    Dementia/depression -Daughter assists with patient's ADLs/IADLs at home -Continue preadmission Namenda and Celexa    Anemia -Baseline hemoglobin 10.5>9.0>9.3  hemoglobin 12.3 in setting of hemoconcentration from dehydration -CBC in a.m.      Dyslipidemia -Not on medications prior to admission    GERD (gastroesophageal reflux disease -Holding preadmission PPI and setting of sepsis physiology, need for broad-spectrum antibiotics i.e. wish to minimize risk of developing C. difficile colitis    Severe protein-calorie malnutrition  Megace discontinued due to increased risk of DVTs -Nutrition consultation    Hypomagnesemia-replete     DVT prophylaxsis heparin drip  Code Status:  DO NOT RESUSCITATE   Family Communication: Discussed in detail with the patient/ daughter , all imaging results, lab results explained to the patient   Disposition Plan: 2-3 days, transferred to telemetry     Consultants:  Orthopedics  Vascular surgery  Procedures:  None  Antibiotics: Anti-infectives    Start     Dose/Rate Route Frequency Ordered Stop   12/15/15 1400  vancomycin (VANCOCIN) IVPB 1000 mg/200 mL premix     1,000  mg 200 mL/hr over 60 Minutes Intravenous Every 48 hours 12/13/15 1328     12/13/15 2300  aztreonam  (AZACTAM) 1 g in dextrose 5 % 50 mL IVPB  Status:  Discontinued     1 g 100 mL/hr over 30 Minutes Intravenous Every 8 hours 12/13/15 1451 12/14/15 1526   12/13/15 2200  aztreonam (AZACTAM) 1 g in dextrose 5 % 50 mL IVPB  Status:  Discontinued     1 g 100 mL/hr over 30 Minutes Intravenous Every 8 hours 12/13/15 1321 12/13/15 1451   12/13/15 1330  metroNIDAZOLE (FLAGYL) IVPB 500 mg  Status:  Discontinued     500 mg 100 mL/hr over 60 Minutes Intravenous Every 8 hours 12/13/15 1304 12/14/15 1526   12/13/15 1330  vancomycin (VANCOCIN) IVPB 1000 mg/200 mL premix     1,000 mg 200 mL/hr over 60 Minutes Intravenous  Once 12/13/15 1306 12/13/15 1645   12/13/15 1315  aztreonam (AZACTAM) 1 g in dextrose 5 % 50 mL IVPB     1 g 100 mL/hr over 30 Minutes Intravenous  Once 12/13/15 1301 12/13/15 1549   12/13/15 1300  aztreonam (AZACTAM) 2 g in dextrose 5 % 50 mL IVPB  Status:  Discontinued     2 g 100 mL/hr over 30 Minutes Intravenous  Once 12/13/15 1251 12/13/15 1300   12/13/15 1300  vancomycin (VANCOCIN) IVPB 1000 mg/200 mL premix  Status:  Discontinued     1,000 mg 200 mL/hr over 60 Minutes Intravenous  Once 12/13/15 1251 12/13/15 1306         HPI/Subjective: Patient is alert and oriented in the morning, somewhat confused and disoriented in the evenings, denies any leg pain  Objective: Vitals:   12/16/15 0200 12/16/15 0300 12/16/15 0400 12/16/15 0752  BP: 125/75 126/82 (!) 141/85   Pulse: 78 81 88   Resp: 19 19 20    Temp:  98.2 F (36.8 C)    TempSrc:  Oral    SpO2: 99% 99% 96% 100%  Weight:      Height:        Intake/Output Summary (Last 24 hours) at 12/16/15 0828 Last data filed at 12/16/15 0403  Gross per 24 hour  Intake          3115.83 ml  Output             1800 ml  Net          1315.83 ml    Exam:  Examination: Cardiovascular: Regular tachycardic rate and rhythm, no murmurs / rubs / gallops. No extremity edema. 1+ pedal pulses. No carotid bruits. Systolic blood  pressure has ranged from 81-102 with MAP greater than 65 except for one reading Abdomen: no tenderness, no masses palpated. No hepatosplenomegaly. Bowel sounds positive.  Musculoskeletal: no clubbing / cyanosis. No joint deformity upper and lower extremities. Good ROM, no contractures. Normal muscle tone.  Skin: Left lower extremity with changes consistent with chronic venous stasis notable hemosiderin changes, distended veins primarily anterior tibialis and foot; right lower extremity with marked erythematous changes that in certain areas has evolved into an ecchymotic appearance and this is associated with pitting edema but is sparing the foot in the patellar region (please see photos below to better clarify description); patient is having significant pain disproportionate to clinical findings    Data Reviewed: I have personally reviewed following labs and imaging studies  Micro Results Recent Results (from the past 240 hour(s))  Blood Culture (routine x 2)  Status: None (Preliminary result)   Collection Time: 12/13/15 12:25 PM  Result Value Ref Range Status   Specimen Description BLOOD LEFT FOREARM  Final   Special Requests BOTTLES DRAWN AEROBIC AND ANAEROBIC 5CC  Final   Culture NO GROWTH 2 DAYS  Final   Report Status PENDING  Incomplete  Blood Culture (routine x 2)     Status: None (Preliminary result)   Collection Time: 12/13/15  1:15 PM  Result Value Ref Range Status   Specimen Description BLOOD RIGHT ANTECUBITAL  Final   Special Requests BOTTLES DRAWN AEROBIC AND ANAEROBIC 5CC  Final   Culture NO GROWTH 2 DAYS  Final   Report Status PENDING  Incomplete  MRSA PCR Screening     Status: None   Collection Time: 12/13/15  5:56 PM  Result Value Ref Range Status   MRSA by PCR NEGATIVE NEGATIVE Final    Comment:        The GeneXpert MRSA Assay (FDA approved for NASAL specimens only), is one component of a comprehensive MRSA colonization surveillance program. It is not intended  to diagnose MRSA infection nor to guide or monitor treatment for MRSA infections.   Urine culture     Status: Abnormal   Collection Time: 12/13/15  6:08 PM  Result Value Ref Range Status   Specimen Description URINE, RANDOM  Final   Special Requests NONE  Final   Culture MULTIPLE SPECIES PRESENT, SUGGEST RECOLLECTION (A)  Final   Report Status 12/14/2015 FINAL  Final    Radiology Reports Ct Femur Right W Contrast  Addendum Date: 12/13/2015   ADDENDUM REPORT: 12/13/2015 17:58 ADDENDUM: The original report was by Dr. Van Clines. The following addendum is by Dr. Van Clines: These results were called by telephone at the time of interpretation on 12/13/2015 at 5:56 pm to Dr. Doren Custard HOBBS, who verbally acknowledged these results. Electronically Signed   By: Van Clines M.D.   On: 12/13/2015 17:58   Result Date: 12/13/2015 CLINICAL DATA:  Right leg cellulitis, worsening.  Arthritis. EXAM: CT OF THE RIGHT FEMUR WITHOUT CONTRAST CT OF THE RIGHT TIBIA/ FIBULA WITHOUT CONTRAST TECHNIQUE: Multidetector CT imaging of the lower right extremity was performed according to the standard protocol following intravenous contrast administration. COMPARISON:  None. CONTRAST:  24m ISOVUE-300 IOPAMIDOL (ISOVUE-300) INJECTION 61% FINDINGS: RIGHT FEMUR: No findings of active osteomyelitis. Moderate osteoarthritis of the knee with tricompartmental spurring and a trace knee effusion. There is a left ureteral stent extending into the urinary bladder. Small left inguinal lymph nodes are present. Atherosclerotic calcification in the common femoral artery and superficial femoral artery ; there is a saccular aneurysm of the SFA centered about 12 cm above the joint line of the knee, mild surrounding stranding, the aneurysm measures 3.3 by 2.8 by 2.7 cm and is partially thrombosed. There is partially occlusive deep vein thrombosis in the right popliteal vein extending down into the tibial veins. Subcutaneous  edema laterally in the distal thigh. No abscess identified. RIGHT TIBIA/FIBULA: No findings of osteomyelitis. Subcutaneous edema fairly circumferentially in the calf and extending down to the ankle where it is again circumferential. This could reflect cellulitis but is nonspecific and there is no abscess or gas tracking in the soft tissues. No abnormal effacement of fat planes along the calf musculature. Aside from the clot in the popliteal vein, there is branching clot extending in the proximal aspects of the anterior tibial vein and posterior tibial vein. Atherosclerotic calcification of arterial structures noted, patent to the arterial structures  is difficult to assess as today' s exam was not performed as an angiogram type of study, correlation with distal pulses recommended. IMPRESSION: 1. 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 At the upper margin of this aneurysm there is some narrowing of the artery. Urgent vascular surgical consultation recommended for further workup. 2. Likely acute popliteal vein partially occlusive DVT extending into the anterior and posterior tibial veins. This was not shown on the prior ultrasound exam from 11/25/2015. 3. Circumferential subcutaneous edema in the calf and ankle, along with some edema in the subcutaneous tissues of the lateral thigh. Although cellulitis could possibly give this appearance, the appearance could also be related to the DVT, venous stasis, or other conditions. No abscess or findings suggesting myositis/fasciitis. 4. Moderate osteoarthritis of the knee with small knee effusion. Radiology assistant personnel have been notified to put me in telephone contact with the referring physician or the referring physician's clinical representative in order to discuss these findings. Once this communication is established I will issue an addendum to this report for documentation  purposes. Electronically Signed: By: Van Clines M.D. On: 12/13/2015 17:25   Ct Tibia Fibula Right W Contrast  Addendum Date: 12/13/2015   ADDENDUM REPORT: 12/13/2015 17:58 ADDENDUM: The original report was by Dr. Van Clines. The following addendum is by Dr. Van Clines: These results were called by telephone at the time of interpretation on 12/13/2015 at 5:56 pm to Dr. Doren Custard HOBBS, who verbally acknowledged these results. Electronically Signed   By: Van Clines M.D.   On: 12/13/2015 17:58   Result Date: 12/13/2015 CLINICAL DATA:  Right leg cellulitis, worsening.  Arthritis. EXAM: CT OF THE RIGHT FEMUR WITHOUT CONTRAST CT OF THE RIGHT TIBIA/ FIBULA WITHOUT CONTRAST TECHNIQUE: Multidetector CT imaging of the lower right extremity was performed according to the standard protocol following intravenous contrast administration. COMPARISON:  None. CONTRAST:  24m ISOVUE-300 IOPAMIDOL (ISOVUE-300) INJECTION 61% FINDINGS: RIGHT FEMUR: No findings of active osteomyelitis. Moderate osteoarthritis of the knee with tricompartmental spurring and a trace knee effusion. There is a left ureteral stent extending into the urinary bladder. Small left inguinal lymph nodes are present. Atherosclerotic calcification in the common femoral artery and superficial femoral artery ; there is a saccular aneurysm of the SFA centered about 12 cm above the joint line of the knee, mild surrounding stranding, the aneurysm measures 3.3 by 2.8 by 2.7 cm and is partially thrombosed. There is partially occlusive deep vein thrombosis in the right popliteal vein extending down into the tibial veins. Subcutaneous edema laterally in the distal thigh. No abscess identified. RIGHT TIBIA/FIBULA: No findings of osteomyelitis. Subcutaneous edema fairly circumferentially in the calf and extending down to the ankle where it is again circumferential. This could reflect cellulitis but is nonspecific and there is no abscess or gas  tracking in the soft tissues. No abnormal effacement of fat planes along the calf musculature. Aside from the clot in the popliteal vein, there is branching clot extending in the proximal aspects of the anterior tibial vein and posterior tibial vein. Atherosclerotic calcification of arterial structures noted, patent to the arterial structures is difficult to assess as today' s exam was not performed as an angiogram type of study, correlation with distal pulses recommended. IMPRESSION: 1. 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 At the upper margin of this aneurysm there is some narrowing of the artery. Urgent vascular  surgical consultation recommended for further workup. 2. Likely acute popliteal vein partially occlusive DVT extending into the anterior and posterior tibial veins. This was not shown on the prior ultrasound exam from 11/25/2015. 3. Circumferential subcutaneous edema in the calf and ankle, along with some edema in the subcutaneous tissues of the lateral thigh. Although cellulitis could possibly give this appearance, the appearance could also be related to the DVT, venous stasis, or other conditions. No abscess or findings suggesting myositis/fasciitis. 4. Moderate osteoarthritis of the knee with small knee effusion. Radiology assistant personnel have been notified to put me in telephone contact with the referring physician or the referring physician's clinical representative in order to discuss these findings. Once this communication is established I will issue an addendum to this report for documentation purposes. Electronically Signed: By: Van Clines M.D. On: 12/13/2015 17:25   US Venous Img Lower Unilateral Right  Result Date: 11/25/2015 CLINICAL DATA:  Acute on chronic cellulitis EXAM: RIGHT LOWER EXTREMITY VENOUS DOPPLER ULTRASOUND TECHNIQUE: Gray-scale sonography with compression, as well as color and  duplex ultrasound, were performed to evaluate the deep venous system from the level of the common femoral vein through the popliteal and proximal calf veins. COMPARISON:  11/03/2015 FINDINGS: Normal compressibility of the common femoral, superficial femoral, and popliteal veins, as well as the proximal calf veins. No filling defects to suggest DVT on grayscale or color Doppler imaging. Doppler waveforms show normal direction of venous flow, normal respiratory phasicity and response to augmentation. Visualized segments of the saphenous venous system normal in caliber and compressibility. Survey views of the contralateral common femoral vein are unremarkable. IMPRESSION: No evidence of  lower extremity deep vein thrombosis, right. Electronically Signed   By: Lucrezia Europe M.D.   On: 11/25/2015 10:51   US Arterial Seg Single  Result Date: 11/25/2015 CLINICAL DATA:  Bilateral toe ulcers. Right lower extremity cellulitis. EXAM: NONINVASIVE PHYSIOLOGIC VASCULAR STUDY OF BILATERAL LOWER EXTREMITIES TECHNIQUE: Evaluation of both lower extremities were performed at rest, including calculation of ankle-brachial indices with single level Doppler, pressure and pulse volume recording. COMPARISON:  None. FINDINGS: Right ABI:  1.46 Left ABI:  1.80 Right Lower Extremity: Normal triphasic Doppler waveform in the right posterior tibial artery. Left Lower Extremity: Triphasic Doppler waveforms in the left ankle. IMPRESSION: Ankle-brachial indices are markedly elevated. Findings suggest heavily calcified arteries. No significant abnormality involving the ankle waveforms. Electronically Signed   By: Markus Daft M.D.   On: 11/25/2015 10:26     CBC  Recent Labs Lab 12/13/15 1158 12/14/15 0307 12/15/15 0131 12/15/15 1044 12/16/15 0458  WBC 13.3* 12.5* 15.2* 14.9* 10.9*  HGB 12.3 9.0* 9.4* 10.1* 9.3*  HCT 37.8 27.1* 28.9* 31.2* 28.2*  PLT 287 209 193 219 225  MCV 96.9 97.5 98.3 98.4 96.2  MCH 31.5 32.4 32.0 31.9 31.7   MCHC 32.5 33.2 32.5 32.4 33.0  RDW 15.8* 16.0* 16.1* 15.8* 15.5  LYMPHSABS 0.4*  --   --   --   --   MONOABS 0.3  --   --   --   --   EOSABS 0.0  --   --   --   --   BASOSABS 0.0  --   --   --   --     Chemistries   Recent Labs Lab 12/13/15 1158 12/13/15 1759 12/14/15 0307 12/15/15 0131 12/16/15 0458  NA 135  --  136 135 138  K 3.4*  --  3.4* 3.4* 3.9  CL 102  --  109 108 111  CO2 24  --  23 22 20*  GLUCOSE 113*  --  95 89 117*  BUN 13  --  9 7 11   CREATININE 1.22*  --  0.83 0.83 0.69  CALCIUM 9.2  --  7.9* 8.1* 8.1*  MG  --  1.5*  --   --  1.8  AST 24  --  15 17  --   ALT 20  --  14 18  --   ALKPHOS 40  --  30* 45  --   BILITOT 1.2  --  0.9 0.6  --    ------------------------------------------------------------------------------------------------------------------ estimated creatinine clearance is 38.8 mL/min (by C-G formula based on SCr of 0.69 mg/dL). ------------------------------------------------------------------------------------------------------------------ No results for input(s): HGBA1C in the last 72 hours. ------------------------------------------------------------------------------------------------------------------ No results for input(s): CHOL, HDL, LDLCALC, TRIG, CHOLHDL, LDLDIRECT in the last 72 hours. ------------------------------------------------------------------------------------------------------------------ No results for input(s): TSH, T4TOTAL, T3FREE, THYROIDAB in the last 72 hours.  Invalid input(s): FREET3 ------------------------------------------------------------------------------------------------------------------ No results for input(s): VITAMINB12, FOLATE, FERRITIN, TIBC, IRON, RETICCTPCT in the last 72 hours.  Coagulation profile  Recent Labs Lab 12/13/15 1600  INR 1.25    No results for input(s): DDIMER in the last 72 hours.  Cardiac Enzymes No results for input(s): CKMB, TROPONINI, MYOGLOBIN in the last 168  hours.  Invalid input(s): CK ------------------------------------------------------------------------------------------------------------------ Invalid input(s): POCBNP   CBG:  Recent Labs Lab 12/13/15 2127  GLUCAP 104*       Studies: No results found.    Lab Results  Component Value Date   HGBA1C  01/24/2008    5.7 (NOTE)   The ADA recommends the following therapeutic goal for glycemic   control related to Hgb A1C measurement:   Goal of Therapy:   < 7.0% Hgb A1C   Reference: American Diabetes Association: Clinical Practice   Recommendations 2008, Diabetes Care,  2008, 31:(Suppl 1).   Lab Results  Component Value Date   LDLCALC 118 (H) 11/21/2012   CREATININE 0.69 12/16/2015       Scheduled Meds: . atorvastatin  40 mg Oral q1800  . budesonide  6 mg Oral q morning - 10a  . citalopram  10 mg Oral Daily  . darifenacin  7.5 mg Oral Daily  . feeding supplement (ENSURE ENLIVE)  237 mL Oral BID BM  . ferrous sulfate  325 mg Oral Q breakfast  . levETIRAcetam  250 mg Oral BID  . magnesium oxide  400 mg Oral BID  . memantine  10 mg Oral BID  . sodium chloride flush  3 mL Intravenous Q12H  . tiotropium  1 capsule Inhalation Daily  . vancomycin  1,000 mg Intravenous Q48H   Continuous Infusions: . 0.9 % NaCl with KCl 40 mEq / L 75 mL/hr (12/15/15 2338)  . heparin 1,250 Units/hr (12/16/15 0223)     LOS: 3 days    Time spent: >30 MINS    [facility] Pager [phone]. If 7PM-7AM, please contact night-coverage at [email or web address], password [facility] 12/16/2015, 8:28 AM  LOS: 3 days

## 2015-12-17 ENCOUNTER — Other Ambulatory Visit: Payer: Self-pay | Admitting: *Deleted

## 2015-12-17 DIAGNOSIS — J449 Chronic obstructive pulmonary disease, unspecified: Secondary | ICD-10-CM

## 2015-12-17 DIAGNOSIS — N179 Acute kidney failure, unspecified: Secondary | ICD-10-CM

## 2015-12-17 DIAGNOSIS — I723 Aneurysm of iliac artery: Secondary | ICD-10-CM

## 2015-12-17 DIAGNOSIS — I82439 Acute embolism and thrombosis of unspecified popliteal vein: Secondary | ICD-10-CM

## 2015-12-17 DIAGNOSIS — L03115 Cellulitis of right lower limb: Secondary | ICD-10-CM

## 2015-12-17 DIAGNOSIS — D649 Anemia, unspecified: Secondary | ICD-10-CM

## 2015-12-17 DIAGNOSIS — K219 Gastro-esophageal reflux disease without esophagitis: Secondary | ICD-10-CM

## 2015-12-17 DIAGNOSIS — E43 Unspecified severe protein-calorie malnutrition: Secondary | ICD-10-CM

## 2015-12-17 DIAGNOSIS — F0390 Unspecified dementia without behavioral disturbance: Secondary | ICD-10-CM

## 2015-12-17 DIAGNOSIS — E860 Dehydration: Secondary | ICD-10-CM

## 2015-12-17 DIAGNOSIS — L538 Other specified erythematous conditions: Secondary | ICD-10-CM

## 2015-12-17 DIAGNOSIS — E785 Hyperlipidemia, unspecified: Secondary | ICD-10-CM

## 2015-12-17 LAB — HEPARIN LEVEL (UNFRACTIONATED): HEPARIN UNFRACTIONATED: 0.62 [IU]/mL (ref 0.30–0.70)

## 2015-12-17 LAB — CBC
HEMATOCRIT: 27.9 % — AB (ref 36.0–46.0)
Hemoglobin: 9 g/dL — ABNORMAL LOW (ref 12.0–15.0)
MCH: 31.4 pg (ref 26.0–34.0)
MCHC: 32.3 g/dL (ref 30.0–36.0)
MCV: 97.2 fL (ref 78.0–100.0)
PLATELETS: 218 10*3/uL (ref 150–400)
RBC: 2.87 MIL/uL — ABNORMAL LOW (ref 3.87–5.11)
RDW: 15.8 % — AB (ref 11.5–15.5)
WBC: 6.2 10*3/uL (ref 4.0–10.5)

## 2015-12-17 NOTE — Progress Notes (Signed)
    Echo reviewed -   Study Conclusions  - Left ventricle: The cavity size was normal. Wall thickness was   increased in a pattern of mild LVH. Systolic function was normal.   The estimated ejection fraction was in the range of 55% to 60%.   Doppler parameters are consistent with abnormal left ventricular   relaxation (grade 1 diastolic dysfunction). The E/e&' ratio is   >15, suggesting elevated LV filling pressure. - Aortic valve: Sclerosis without stenosis. There was trivial   regurgitation. - Mitral valve: Mildly thickened leaflets . There was trivial   regurgitation. - Left atrium: The atrium was normal in size. - Right atrium: The atrium was normal in size. - Inferior vena cava: The vessel was dilated. The respirophasic   diameter changes were blunted (< 50%), consistent with elevated   central venous pressure.  Impressions:  - Compared to a prior study in 2015, there have been no significant   changes.  With essentially normal Echo for age & no active Angina/CHF Sx, would recommend proceeding with Vascular Procedures without any further Cardiology evaluation.  We will be available as needed peri-op. Will follow peripherally.    Glenetta Hew, MD

## 2015-12-17 NOTE — Patient Outreach (Signed)
[facility]) Care Management  12/17/2015  Bridget Ball 1928-04-25 [id number]  [name] with past medical history of COPD, dementia, GERD, CVA, subdural bleed due to fall complicated with seizures, HLD and was admitted to the hospital in November withrecurrent cellulitis of the LE.   Bridget Ball was referred to [facility] for transition of care assessments. I spoke with Bridget Ball daughter Bridget Ball who has been providing care since Bridget Ball's discharge from the hospital on 11/23. We last spoke on 11/27 when Bridget Ball said she Bridget Ball had shown drastic improvement in symptoms related to her lower extremity cellulitis.   Bridget Ball was readmitted to the hospital on 12/13/15 with sepsis.   Plan: [facility] Care Management will follow Bridget Ball progress closely and resume Transition of Care Services at discharge.    [facility]  [phone]

## 2015-12-17 NOTE — Progress Notes (Signed)
INFECTIOUS DISEASE PROGRESS NOTE  ID: Bridget Ball is a 82 y.o. 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   Abtx:  Anti-infectives    Start     Dose/Rate Route Frequency Ordered Stop   12/16/15 2300  vancomycin (VANCOCIN) 500 mg in sodium chloride 0.9 % 100 mL IVPB     500 mg 100 mL/hr over 60 Minutes Intravenous Every 24 hours 12/16/15 1132     12/15/15 1400  vancomycin (VANCOCIN) IVPB 1000 mg/200 mL premix  Status:  Discontinued     1,000 mg 200 mL/hr over 60 Minutes Intravenous Every 48 hours 12/13/15 1328 12/16/15 1132   12/13/15 2300  aztreonam (AZACTAM) 1 g in dextrose 5 % 50 mL IVPB  Status:  Discontinued     1 g 100 mL/hr over 30 Minutes Intravenous Every 8 hours 12/13/15 1451 12/14/15 1526   12/13/15 2200  aztreonam (AZACTAM) 1 g in dextrose 5 % 50 mL IVPB  Status:  Discontinued     1 g 100 mL/hr over 30 Minutes Intravenous Every 8 hours 12/13/15 1321 12/13/15 1451   12/13/15 1330  metroNIDAZOLE (FLAGYL) IVPB 500 mg  Status:  Discontinued     500 mg 100 mL/hr over 60 Minutes Intravenous Every 8 hours 12/13/15 1304 12/14/15 1526   12/13/15 1330  vancomycin (VANCOCIN) IVPB 1000 mg/200 mL premix     1,000 mg 200 mL/hr over 60 Minutes Intravenous  Once 12/13/15 1306 12/13/15 1645   12/13/15 1315  aztreonam (AZACTAM) 1 g in dextrose 5 % 50 mL IVPB     1 g 100 mL/hr over 30 Minutes Intravenous  Once 12/13/15 1301 12/13/15 1549   12/13/15 1300  aztreonam (AZACTAM) 2 g in dextrose 5 % 50 mL IVPB  Status:  Discontinued     2 g 100 mL/hr over 30 Minutes  Intravenous  Once 12/13/15 1251 12/13/15 1300   12/13/15 1300  vancomycin (VANCOCIN) IVPB 1000 mg/200 mL premix  Status:  Discontinued     1,000 mg 200 mL/hr over 60 Minutes Intravenous  Once 12/13/15 1251 12/13/15 1306      Medications:  Scheduled: . atorvastatin  40 mg Oral q1800  . budesonide  6 mg Oral q morning - 10a  . citalopram  10 mg Oral Daily  . darifenacin  7.5 mg Oral Daily  . feeding supplement (ENSURE ENLIVE)  237 mL Oral BID BM  . ferrous sulfate  325 mg Oral Q breakfast  . levETIRAcetam  250 mg Oral BID  . magnesium oxide  400 mg Oral BID  . memantine  10 mg Oral BID  . sodium chloride flush  3 mL Intravenous Q12H  . tiotropium  1 capsule Inhalation Daily  . vancomycin  500 mg Intravenous Q24H    Objective: Vital signs in last 24 hours: Temp:  [97.5 F (36.4 C)-99 F (37.2 C)] 98.3 F (36.8 C) (12/12 0914) Pulse Rate:  [68-120] 68 (12/12 0600) Resp:  [15-31] 19 (12/12 0600) BP: (104-138)/(65-114) 129/79 (12/12 0400) SpO2:  [94 %-100 %] 100 % (  12/12 0600) Weight:  [49.1 kg (108 lb 4.8 oz)] 49.1 kg (108 lb 4.8 oz) (12/12 0311)   General appearance: alert, cooperative and no distress Extremities: decreased erythema, decreased tenderness, decreased warmth RLE/thigh vs previous.   Lab Results  Recent Labs  12/15/15 0131  12/16/15 0458 12/17/15 0536  WBC 15.2*  < > 10.9* 6.2  HGB 9.4*  < > 9.3* 9.0*  HCT 28.9*  < > 28.2* 27.9*  NA 135  --  138  --   K 3.4*  --  3.9  --   CL 108  --  111  --   CO2 22  --  20*  --   BUN 7  --  11  --   CREATININE 0.83  --  0.69  --   < > = values in this interval not displayed. Liver Panel  Recent Labs  12/15/15 0131  PROT 5.1*  ALBUMIN 2.6*  AST 17  ALT 18  ALKPHOS 45  BILITOT 0.6   Sedimentation Rate No results for input(s): ESRSEDRATE in the last 72 hours. C-Reactive Protein No results for input(s): CRP in the last 72 hours.  Microbiology: Recent Results (from the past 240 hour(s))  Blood Culture  (routine x 2)     Status: None (Preliminary result)   Collection Time: 12/13/15 12:25 PM  Result Value Ref Range Status   Specimen Description BLOOD LEFT FOREARM  Final   Special Requests BOTTLES DRAWN AEROBIC AND ANAEROBIC 5CC  Final   Culture NO GROWTH 3 DAYS  Final   Report Status PENDING  Incomplete  Blood Culture (routine x 2)     Status: None (Preliminary result)   Collection Time: 12/13/15  1:15 PM  Result Value Ref Range Status   Specimen Description BLOOD RIGHT ANTECUBITAL  Final   Special Requests BOTTLES DRAWN AEROBIC AND ANAEROBIC 5CC  Final   Culture NO GROWTH 3 DAYS  Final   Report Status PENDING  Incomplete  MRSA PCR Screening     Status: None   Collection Time: 12/13/15  5:56 PM  Result Value Ref Range Status   MRSA by PCR NEGATIVE NEGATIVE Final    Comment:        The GeneXpert MRSA Assay (FDA approved for NASAL specimens only), is one component of a comprehensive MRSA colonization surveillance program. It is not intended to diagnose MRSA infection nor to guide or monitor treatment for MRSA infections.   Urine culture     Status: Abnormal   Collection Time: 12/13/15  6:08 PM  Result Value Ref Range Status   Specimen Description URINE, RANDOM  Final   Special Requests NONE  Final   Culture MULTIPLE SPECIES PRESENT, SUGGEST RECOLLECTION (A)  Final   Report Status 12/14/2015 FINAL  Final    Studies/Results: No results found.   Assessment/Plan: Popliteal Artery Aneurysm DVT Cellulitis RLE  Total days of antibiotics: 4 vanco  \she is improving.  Would continue vanco for 3 more days (at most) then onto po (doxy) for another week.  Available as needed.          Bobby Rumpf Infectious Diseases (pager) [phone] [email or web address] 12/17/2015, 11:45 AM  LOS: 4 days

## 2015-12-17 NOTE — Progress Notes (Addendum)
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 female with medical history significant for COPD, dementia, history of seizures secondary to prior subarachnoid hemorrhage, anemia, dyslipidemia, GERD who was recently discharged on 11/23 from [facility] after being treated for right lower extremity cellulitis. Patient evaluated by vascular surgery and infectious disease. Vascular surgery plans intervention for  Popliteal Artery Aneurysm this week   Assessment and plan Sepsis affecting skin /Recurrent Cellulitis of right lower leg -Patient presents with rapid onset and progression of significant right lower extremity erythema and edema with pain disproportionate to clinical findings; this is concerning for possible soft tissue necrotic process -Discussed with orthopedic physician/ Dr Bridget Ball  , no evidence of necrotizing fasciitis Antibiotics narrowed as per infectious disease recommendations-Dr. Campbell/[name] , change vanc to doxy in a few days  Cortisol level within normal limits. Pro-calcitonin 6.87, CRP 8.1, lactic acid 2.02 Blood cultures no growth so far CT scan showed 3.3 cm pseudoaneurysm-distal right superficial femoral artery/acute DVT extending into the anterior and posterior tibial veins Carotid artery duplex -  1-39 percent stenosis involving the right internal carotid artery and the left internal carotid artery With essentially normal Echo  for age & no active Angina/CHF Sx, would recommend proceeding with Vascular Procedures without any further Cardiology evaluation.   Right popliteal artery aneurysm with DVT Currently on a heparin drip as recommended by vascular surgery   popliteal aneurysm -per Dr. Trula Ball, continue heparin drip pending plans for further intervention Vascular to address  SFA/Pop aneurysm, later in the week  [name] a 2-D echo, cardiology clearance completed. Plan per Dr. Trula Ball      Dehydration/ AKI (acute kidney injury)   Creatinine stable, improved from 1.22>0.69    Seizure disorder secondary to prior Bridget Ball -Continue preadmission Keppra -If   NPO we'll need to convert to IV dosing -Seizure precautions    COPD (chronic obstructive pulmonary disease) -Currently stable without wheezing  -Continue oral budesonide    Dementia/depression -Daughter assists with patient's ADLs/IADLs at home -Continue preadmission Namenda and Celexa    Anemia -Baseline hemoglobin 10.5>9.0>9.3  hemoglobin 12.3 in setting of hemoconcentration from dehydration -CBC in a.m.      Dyslipidemia -Not on medications prior to admission    GERD (gastroesophageal reflux disease -Holding preadmission PPI and setting of sepsis physiology, need for broad-spectrum antibiotics i.e. wish to minimize risk of developing C. difficile colitis    Severe protein-calorie malnutrition  Megace discontinued due to increased risk of DVTs -Nutrition consultation    Hypomagnesemia-repleted     DVT prophylaxsis heparin drip  Code Status:  DO NOT RESUSCITATE   Family Communication: Discussed in detail with the patient/ daughter , all imaging results, lab results explained to the patient   Disposition Plan: 2-3 days, transferred to telemetry   Consultants:  Orthopedics  Vascular surgery  Cardiology  Procedures:  None  Antibiotics: Anti-infectives    Start  Dose/Rate Route Frequency Ordered Stop   12/16/15  2300  vancomycin (VANCOCIN) 500 mg in sodium chloride 0.9 % 100 mL IVPB     500 mg 100 mL/hr over 60 Minutes Intravenous Every 24 hours 12/16/15 1132     12/15/15 1400  vancomycin (VANCOCIN) IVPB 1000 mg/200 mL premix  Status:  Discontinued     1,000 mg 200 mL/hr over 60 Minutes Intravenous Every 48 hours 12/13/15 1328 12/16/15 1132   12/13/15 2300  aztreonam (AZACTAM) 1 g in dextrose 5 % 50 mL IVPB  Status:  Discontinued     1 g 100 mL/hr over 30 Minutes Intravenous Every 8 hours 12/13/15 1451 12/14/15 1526   12/13/15 2200  aztreonam (AZACTAM) 1 g in dextrose 5 % 50 mL IVPB  Status:  Discontinued     1 g 100 mL/hr over 30 Minutes Intravenous Every 8 hours 12/13/15 1321 12/13/15 1451   12/13/15 1330  metroNIDAZOLE (FLAGYL) IVPB 500 mg  Status:  Discontinued     500 mg 100 mL/hr over 60 Minutes Intravenous Every 8 hours 12/13/15 1304 12/14/15 1526   12/13/15 1330  vancomycin (VANCOCIN) IVPB 1000 mg/200 mL premix     1,000 mg 200 mL/hr over 60 Minutes Intravenous  Once 12/13/15 1306 12/13/15 1645   12/13/15 1315  aztreonam (AZACTAM) 1 g in dextrose 5 % 50 mL IVPB     1 g 100 mL/hr over 30 Minutes Intravenous  Once 12/13/15 1301 12/13/15 1549   12/13/15 1300  aztreonam (AZACTAM) 2 g in dextrose 5 % 50 mL IVPB  Status:  Discontinued     2 g 100 mL/hr over 30 Minutes Intravenous  Once 12/13/15 1251 12/13/15 1300   12/13/15 1300  vancomycin (VANCOCIN) IVPB 1000 mg/200 mL premix  Status:  Discontinued     1,000 mg 200 mL/hr over 60 Minutes Intravenous  Once 12/13/15 1251 12/13/15 1306         HPI/Subjective: Patient is alert and oriented. Anxious about upcoming procedure   Objective: Vitals:   12/17/15 0400 12/17/15 0500 12/17/15 0600 12/17/15 0914  BP: 129/79     Pulse: 79 93 68   Resp: 15 17 19    Temp:    98.3 F (36.8 C)  TempSrc:    Oral  SpO2: 100% 100% 100%   Weight:      Height:        Intake/Output Summary (Last 24 hours) at 12/17/15 1100 Last data filed at  12/17/15 0600  Gross per 24 hour  Intake           1772.5 ml  Output             1075 ml  Net            697.5 ml    Exam:  Examination: Cardiovascular: Regular tachycardic rate and rhythm, no murmurs / rubs / gallops. No extremity edema. 1+ pedal pulses. No carotid bruits. Systolic blood pressure has ranged from 81-102 with MAP greater than 65 except for one reading Abdomen: no tenderness, no masses palpated. No hepatosplenomegaly. Bowel sounds positive.  Musculoskeletal: no clubbing / cyanosis. No joint deformity upper and lower extremities. Good ROM, no contractures. Normal muscle tone.  Skin: Left lower extremity with changes consistent with chronic venous stasis notable hemosiderin changes, distended veins primarily anterior tibialis and foot; right lower extremity with marked erythematous changes that in certain areas has evolved into an ecchymotic appearance and this is associated with pitting edema but is sparing the foot  in the patellar region (please see photos below to better clarify description); patient is having significant pain disproportionate to clinical findings    Data Reviewed: I have personally reviewed following labs and imaging studies  Micro Results Recent Results (from the past 240 hour(s))  Blood Culture (routine x 2)     Status: None (Preliminary result)   Collection Time: 12/13/15 12:25 PM  Result Value Ref Range Status   Specimen Description BLOOD LEFT FOREARM  Final   Special Requests BOTTLES DRAWN AEROBIC AND ANAEROBIC 5CC  Final   Culture NO GROWTH 3 DAYS  Final   Report Status PENDING  Incomplete  Blood Culture (routine x 2)     Status: None (Preliminary result)   Collection Time: 12/13/15  1:15 PM  Result Value Ref Range Status   Specimen Description BLOOD RIGHT ANTECUBITAL  Final   Special Requests BOTTLES DRAWN AEROBIC AND ANAEROBIC 5CC  Final   Culture NO GROWTH 3 DAYS  Final   Report Status PENDING  Incomplete  MRSA PCR Screening     Status:  None   Collection Time: 12/13/15  5:56 PM  Result Value Ref Range Status   MRSA by PCR NEGATIVE NEGATIVE Final    Comment:        The GeneXpert MRSA Assay (FDA approved for NASAL specimens only), is one component of a comprehensive MRSA colonization surveillance program. It is not intended to diagnose MRSA infection nor to guide or monitor treatment for MRSA infections.   Urine culture     Status: Abnormal   Collection Time: 12/13/15  6:08 PM  Result Value Ref Range Status   Specimen Description URINE, RANDOM  Final   Special Requests NONE  Final   Culture MULTIPLE SPECIES PRESENT, SUGGEST RECOLLECTION (A)  Final   Report Status 12/14/2015 FINAL  Final    Radiology Reports Ct Femur Right W Contrast  Addendum Date: 12/13/2015   ADDENDUM REPORT: 12/13/2015 17:58 ADDENDUM: The original report was by Dr. Van Clines. The following addendum is by Dr. Van Clines: These results were called by telephone at the time of interpretation on 12/13/2015 at 5:56 pm to Dr. Doren Custard HOBBS, who verbally acknowledged these results. Electronically Signed   By: Van Clines M.D.   On: 12/13/2015 17:58   Result Date: 12/13/2015 CLINICAL DATA:  Right leg cellulitis, worsening.  Arthritis. EXAM: CT OF THE RIGHT FEMUR WITHOUT CONTRAST CT OF THE RIGHT TIBIA/ FIBULA WITHOUT CONTRAST TECHNIQUE: Multidetector CT imaging of the lower right extremity was performed according to the standard protocol following intravenous contrast administration. COMPARISON:  None. CONTRAST:  13mL ISOVUE-300 IOPAMIDOL (ISOVUE-300) INJECTION 61% FINDINGS: RIGHT FEMUR: No findings of active osteomyelitis. Moderate osteoarthritis of the knee with tricompartmental spurring and a trace knee effusion. There is a left ureteral stent extending into the urinary bladder. Small left inguinal lymph nodes are present. Atherosclerotic calcification in the common femoral artery and superficial femoral artery ; there is a saccular  aneurysm of the SFA centered about 12 cm above the joint line of the knee, mild surrounding stranding, the aneurysm measures 3.3 by 2.8 by 2.7 cm and is partially thrombosed. There is partially occlusive deep vein thrombosis in the right popliteal vein extending down into the tibial veins. Subcutaneous edema laterally in the distal thigh. No abscess identified. RIGHT TIBIA/FIBULA: No findings of osteomyelitis. Subcutaneous edema fairly circumferentially in the calf and extending down to the ankle where it is again circumferential. This could reflect cellulitis but is nonspecific and there is no  abscess or gas tracking in the soft tissues. No abnormal effacement of fat planes along the calf musculature. Aside from the clot in the popliteal vein, there is branching clot extending in the proximal aspects of the anterior tibial vein and posterior tibial vein. Atherosclerotic calcification of arterial structures noted, patent to the arterial structures is difficult to assess as today' s exam was not performed as an angiogram type of study, correlation with distal pulses recommended. IMPRESSION: 1. 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 At the upper margin of this aneurysm there is some narrowing of the artery. Urgent vascular surgical consultation recommended for further workup. 2. Likely acute popliteal vein partially occlusive DVT extending into the anterior and posterior tibial veins. This was not shown on the prior ultrasound exam from 11/25/2015. 3. Circumferential subcutaneous edema in the calf and ankle, along with some edema in the subcutaneous tissues of the lateral thigh. Although cellulitis could possibly give this appearance, the appearance could also be related to the DVT, venous stasis, or other conditions. No abscess or findings suggesting myositis/fasciitis. 4. Moderate osteoarthritis of the knee with small knee effusion.  Radiology assistant personnel have been notified to put me in telephone contact with the referring physician or the referring physician's clinical representative in order to discuss these findings. Once this communication is established I will issue an addendum to this report for documentation purposes. Electronically Signed: By: Van Clines M.D. On: 12/13/2015 17:25   Ct Tibia Fibula Right W Contrast  Addendum Date: 12/13/2015   ADDENDUM REPORT: 12/13/2015 17:58 ADDENDUM: The original report was by Dr. Van Clines. The following addendum is by Dr. Van Clines: These results were called by telephone at the time of interpretation on 12/13/2015 at 5:56 pm to Dr. Doren Custard HOBBS, who verbally acknowledged these results. Electronically Signed   By: Van Clines M.D.   On: 12/13/2015 17:58   Result Date: 12/13/2015 CLINICAL DATA:  Right leg cellulitis, worsening.  Arthritis. EXAM: CT OF THE RIGHT FEMUR WITHOUT CONTRAST CT OF THE RIGHT TIBIA/ FIBULA WITHOUT CONTRAST TECHNIQUE: Multidetector CT imaging of the lower right extremity was performed according to the standard protocol following intravenous contrast administration. COMPARISON:  None. CONTRAST:  75mL ISOVUE-300 IOPAMIDOL (ISOVUE-300) INJECTION 61% FINDINGS: RIGHT FEMUR: No findings of active osteomyelitis. Moderate osteoarthritis of the knee with tricompartmental spurring and a trace knee effusion. There is a left ureteral stent extending into the urinary bladder. Small left inguinal lymph nodes are present. Atherosclerotic calcification in the common femoral artery and superficial femoral artery ; there is a saccular aneurysm of the SFA centered about 12 cm above the joint line of the knee, mild surrounding stranding, the aneurysm measures 3.3 by 2.8 by 2.7 cm and is partially thrombosed. There is partially occlusive deep vein thrombosis in the right popliteal vein extending down into the tibial veins. Subcutaneous edema laterally in  the distal thigh. No abscess identified. RIGHT TIBIA/FIBULA: No findings of osteomyelitis. Subcutaneous edema fairly circumferentially in the calf and extending down to the ankle where it is again circumferential. This could reflect cellulitis but is nonspecific and there is no abscess or gas tracking in the soft tissues. No abnormal effacement of fat planes along the calf musculature. Aside from the clot in the popliteal vein, there is branching clot extending in the proximal aspects of the anterior tibial vein and posterior tibial vein. Atherosclerotic calcification of arterial structures noted, patent to the arterial structures is difficult  to assess as today' s exam was not performed as an angiogram type of study, correlation with distal pulses recommended. IMPRESSION: 1. 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 At the upper margin of this aneurysm there is some narrowing of the artery. Urgent vascular surgical consultation recommended for further workup. 2. Likely acute popliteal vein partially occlusive DVT extending into the anterior and posterior tibial veins. This was not shown on the prior ultrasound exam from 11/25/2015. 3. Circumferential subcutaneous edema in the calf and ankle, along with some edema in the subcutaneous tissues of the lateral thigh. Although cellulitis could possibly give this appearance, the appearance could also be related to the DVT, venous stasis, or other conditions. No abscess or findings suggesting myositis/fasciitis. 4. Moderate osteoarthritis of the knee with small knee effusion. Radiology assistant personnel have been notified to put me in telephone contact with the referring physician or the referring physician's clinical representative in order to discuss these findings. Once this communication is established I will issue an addendum to this report for documentation purposes. Electronically  Signed: By: Van Clines M.D. On: 12/13/2015 17:25   US Venous Img Lower Unilateral Right  Result Date: 11/25/2015 CLINICAL DATA:  Acute on chronic cellulitis EXAM: RIGHT LOWER EXTREMITY VENOUS DOPPLER ULTRASOUND TECHNIQUE: Gray-scale sonography with compression, as well as color and duplex ultrasound, were performed to evaluate the deep venous system from the level of the common femoral vein through the popliteal and proximal calf veins. COMPARISON:  11/03/2015 FINDINGS: Normal compressibility of the common femoral, superficial femoral, and popliteal veins, as well as the proximal calf veins. No filling defects to suggest DVT on grayscale or color Doppler imaging. Doppler waveforms show normal direction of venous flow, normal respiratory phasicity and response to augmentation. Visualized segments of the saphenous venous system normal in caliber and compressibility. Survey views of the contralateral common femoral vein are unremarkable. IMPRESSION: No evidence of  lower extremity deep vein thrombosis, right. Electronically Signed   By: Lucrezia Europe M.D.   On: 11/25/2015 10:51   US Arterial Seg Single  Result Date: 11/25/2015 CLINICAL DATA:  Bilateral toe ulcers. Right lower extremity cellulitis. EXAM: NONINVASIVE PHYSIOLOGIC VASCULAR STUDY OF BILATERAL LOWER EXTREMITIES TECHNIQUE: Evaluation of both lower extremities were performed at rest, including calculation of ankle-brachial indices with single level Doppler, pressure and pulse volume recording. COMPARISON:  None. FINDINGS: Right ABI:  1.46 Left ABI:  1.80 Right Lower Extremity: Normal triphasic Doppler waveform in the right posterior tibial artery. Left Lower Extremity: Triphasic Doppler waveforms in the left ankle. IMPRESSION: Ankle-brachial indices are markedly elevated. Findings suggest heavily calcified arteries. No significant abnormality involving the ankle waveforms. Electronically Signed   By: Markus Daft M.D.   On: 11/25/2015 10:26      CBC  Recent Labs Lab 12/13/15 1158 12/14/15 0307 12/15/15 0131 12/15/15 1044 12/16/15 0458 12/17/15 0536  WBC 13.3* 12.5* 15.2* 14.9* 10.9* 6.2  HGB 12.3 9.0* 9.4* 10.1* 9.3* 9.0*  HCT 37.8 27.1* 28.9* 31.2* 28.2* 27.9*  PLT 287 209 193 219 225 218  MCV 96.9 97.5 98.3 98.4 96.2 97.2  MCH 31.5 32.4 32.0 31.9 31.7 31.4  MCHC 32.5 33.2 32.5 32.4 33.0 32.3  RDW 15.8* 16.0* 16.1* 15.8* 15.5 15.8*  LYMPHSABS 0.4*  --   --   --   --   --   MONOABS 0.3  --   --   --   --   --  EOSABS 0.0  --   --   --   --   --   BASOSABS 0.0  --   --   --   --   --     Chemistries   Recent Labs Lab 12/13/15 1158 12/13/15 1759 12/14/15 0307 12/15/15 0131 12/16/15 0458  NA 135  --  136 135 138  K 3.4*  --  3.4* 3.4* 3.9  CL 102  --  109 108 111  CO2 24  --  23 22 20*  GLUCOSE 113*  --  95 89 117*  BUN 13  --  9 7 11   CREATININE 1.22*  --  0.83 0.83 0.69  CALCIUM 9.2  --  7.9* 8.1* 8.1*  MG  --  1.5*  --   --  1.8  AST 24  --  15 17  --   ALT 20  --  14 18  --   ALKPHOS 40  --  30* 45  --   BILITOT 1.2  --  0.9 0.6  --    ------------------------------------------------------------------------------------------------------------------ estimated creatinine clearance is 38.8 mL/min (by C-G formula based on SCr of 0.69 mg/dL). ------------------------------------------------------------------------------------------------------------------ No results for input(s): HGBA1C in the last 72 hours. ------------------------------------------------------------------------------------------------------------------ No results for input(s): CHOL, HDL, LDLCALC, TRIG, CHOLHDL, LDLDIRECT in the last 72 hours. ------------------------------------------------------------------------------------------------------------------ No results for input(s): TSH, T4TOTAL, T3FREE, THYROIDAB in the last 72 hours.  Invalid input(s):  FREET3 ------------------------------------------------------------------------------------------------------------------ No results for input(s): VITAMINB12, FOLATE, FERRITIN, TIBC, IRON, RETICCTPCT in the last 72 hours.  Coagulation profile  Recent Labs Lab 12/13/15 1600  INR 1.25    No results for input(s): DDIMER in the last 72 hours.  Cardiac Enzymes No results for input(s): CKMB, TROPONINI, MYOGLOBIN in the last 168 hours.  Invalid input(s): CK ------------------------------------------------------------------------------------------------------------------ Invalid input(s): POCBNP   CBG:  Recent Labs Lab 12/13/15 2127  GLUCAP 104*       Studies: No results found.    Lab Results  Component Value Date   HGBA1C  01/24/2008    5.7 (NOTE)   The ADA recommends the following therapeutic goal for glycemic   control related to Hgb A1C measurement:   Goal of Therapy:   < 7.0% Hgb A1C   Reference: American Diabetes Association: Clinical Practice   Recommendations 2008, Diabetes Care,  2008, 31:(Suppl 1).   Lab Results  Component Value Date   LDLCALC 118 (H) 11/21/2012   CREATININE 0.69 12/16/2015       Scheduled Meds: . atorvastatin  40 mg Oral q1800  . budesonide  6 mg Oral q morning - 10a  . citalopram  10 mg Oral Daily  . darifenacin  7.5 mg Oral Daily  . feeding supplement (ENSURE ENLIVE)  237 mL Oral BID BM  . ferrous sulfate  325 mg Oral Q breakfast  . levETIRAcetam  250 mg Oral BID  . magnesium oxide  400 mg Oral BID  . memantine  10 mg Oral BID  . sodium chloride flush  3 mL Intravenous Q12H  . tiotropium  1 capsule Inhalation Daily  . vancomycin  500 mg Intravenous Q24H   Continuous Infusions: . 0.9 % NaCl with KCl 40 mEq / L 75 mL/hr (12/16/15 1432)  . heparin 1,250 Units/hr (12/16/15 0223)     LOS: 4 days    Time spent: >30 MINS    [facility] Pager [phone]. If 7PM-7AM, please contact night-coverage at  [email or web address], password [facility] 12/17/2015, 11:00 AM  LOS: 4 days

## 2015-12-17 NOTE — Care Management Important Message (Signed)
Important Message  Patient Details  Name: Bridget Ball MRN: [id number] Date of Birth: 11-08-1928   Medicare Important Message Given:  Yes    [name] Abena 12/17/2015, 10:32 AM

## 2015-12-17 NOTE — Progress Notes (Signed)
ANTICOAGULATION CONSULT NOTE  Pharmacy Consult for heparin Indication:  DVT  Patient Measurements: Height: 5\' 1"  (154.9 cm) Weight: 108 lb 4.8 oz (49.1 kg) IBW/kg (Calculated) : 47.8 Heparin Dosing Weight: 47 kg  Vital Signs: Temp: 98 F (36.7 C) (12/12 0311) Temp Source: Oral (12/12 0311) BP: 129/79 (12/12 0400) Pulse Rate: 68 (12/12 0600)  Labs:  Recent Labs  12/15/15 0131 12/15/15 1044 12/16/15 0458 12/16/15 0904 12/17/15 0536  HGB 9.4* 10.1* 9.3*  --  9.0*  HCT 28.9* 31.2* 28.2*  --  27.9*  PLT 193 219 225  --  218  HEPARINUNFRC 0.24* 0.53  --  0.52 0.62  CREATININE 0.83  --  0.69  --   --     Estimated Creatinine Clearance: 38.8 mL/min (by C-G formula based on SCr of 0.69 mg/dL).  Assessment: 82 y.o. female with R popliteal artery aneurysm and DVT. Pharmacy has been consulted for heparin dosing, and patient may undergo surgery this week per vascular. Anti-Xa level remains therapeutic this morning at 0.62. Hgb low but stable at 9.0, platelets wnl, no S/Sx bleeding documented.   Goal of Therapy:  Heparin level 0.3-0.7 units/ml Monitor platelets by anticoagulation protocol: Yes   Plan:  -Continue heparin at 1250 units / hr -Monitor anti-Xa level, CBC, S/Sx bleeding daily  Arrie Senate, PharmD PGY-1 Pharmacy Resident Pager: [phone] 12/17/2015

## 2015-12-17 NOTE — Progress Notes (Signed)
Final work pending for pre-operative planning.    Carotid duplex: Summary: Findings are consistent with a 1-39 percent stenosis involving the right internal carotid artery and the left internal carotid artery. The vertebral arteries demonstrate antegrade flow.     +----+------------------+--------+------------+-------------------+ !Vein!Location          !Diameter!Observations!Comments           ! +----+------------------+--------+------------+-------------------+ !GSV !Origin right      !4 mm    !Branch      !Patent Compressible! !    !greater saphenous !        !            !                   ! +----+------------------+--------+------------+-------------------+ !GSV !Proximal right    !5.5 mm  !Branch      !Patent Compressible! !    !thigh             !        !            !                   ! +----+------------------+--------+------------+-------------------+ !GSV !Mid right thigh   !5.2 mm  !------------!Patent Compressible! +----+------------------+--------+------------+-------------------+ !GSV !Distal right thigh!4.7 mm  !------------!Patent Compressible! +----+------------------+--------+------------+-------------------+ !GSV !Right knee        !4.8 mm  !------------!Patent Compressible! +----+------------------+--------+------------+-------------------+ !GSV !Proximal right    !5.1 mm  !Branch      !Patent Compressible! !    !calf              !        !            !                   ! +----+------------------+--------+------------+-------------------+ !GSV !Mid right calf    !3.8 mm  !Branch      !Patent Compressible! +----+------------------+--------+------------+-------------------+ !GSV !Right ankle       !1.8 mm  !Branch      !Patent Compressible! +----+------------------+--------+------------+-------------------+ !GSV !Origin left       !6 mm    !------------!Patent Compressible! !    !greater saphenous !        !            !                    ! +----+------------------+--------+------------+-------------------+ !GSV !Proximal left     !3.8 mm  !------------!Patent Compressible! !    !thigh             !        !            !                   ! +----+------------------+--------+------------+-------------------+ !GSV !Mid left thigh    !3.7 mm  !------------!Patent Compressible! +----+------------------+--------+------------+-------------------+ !GSV !Distal left thigh !3.3 mm  !------------!Patent Compressible! +----+------------------+--------+------------+-------------------+ !GSV !Left knee         !2.9 mm  !------------!Patent Compressible! +----+------------------+--------+------------+-------------------+ !GSV !Proximal left calf!2.9 mm  !Branch      !Patent Compressible! +----+------------------+--------+------------+-------------------+ !GSV !Mid left calf     !2 mm    !------------!Patent Compressible! +----+------------------+--------+------------+-------------------+ !GSV !Left ankle        !3.4 mm  !Branch      !Patent Compressible! +----+------------------+--------+------------+-------------------+  ------------------------------------------------------------------- Summary: Refer to table above for  complete vein mapping observations. Prepared and Electronically Authenticated by  Cardiology work in progress  1. Evaluation of cardiac risk for surgery: Does not describe ischemic symptoms at this time. She does have some SOB with activity, but hard to determine if this is due to deconditioning or her COPD.   EKG non ischemic. Risk factors for CAD include prior smoking history, family history, age, and HTN. Will put in order for Echo to evaluate for wall motion abnormality, but with no ischemic symptoms would not recommend any ischemic evaluation.   Further MD recommendations to follow.   Bobby Rumpf Infectious Diseases  Assessment/Plan: Popliteal Artery Aneurysm DVT Cellulitis RLE  Total days of  antibiotics: 4 vanco  Will continue her vanco Explained inflammatory response to her daughter.   Plan per Dr. Trula Slade once cardiology work up complete. Some improvement, question if worsening of thigh is due to dependence.   Darlene Brozowski MAUREEN PA-C

## 2015-12-18 LAB — COMPREHENSIVE METABOLIC PANEL
ALBUMIN: 2.4 g/dL — AB (ref 3.5–5.0)
ALT: 18 U/L (ref 14–54)
ANION GAP: 9 (ref 5–15)
AST: 15 U/L (ref 15–41)
Alkaline Phosphatase: 36 U/L — ABNORMAL LOW (ref 38–126)
BUN: 9 mg/dL (ref 6–20)
CHLORIDE: 111 mmol/L (ref 101–111)
CO2: 21 mmol/L — AB (ref 22–32)
Calcium: 8.5 mg/dL — ABNORMAL LOW (ref 8.9–10.3)
Creatinine, Ser: 0.73 mg/dL (ref 0.44–1.00)
GFR calc non Af Amer: >60 mL/min (ref >60)
Glucose, Bld: 111 mg/dL — ABNORMAL HIGH (ref 65–99)
Potassium: 4.1 mmol/L (ref 3.5–5.1)
SODIUM: 141 mmol/L (ref 135–145)
Total Bilirubin: 0.2 mg/dL — ABNORMAL LOW (ref 0.3–1.2)
Total Protein: 4.7 g/dL — ABNORMAL LOW (ref 6.5–8.1)

## 2015-12-18 LAB — URINALYSIS, ROUTINE W REFLEX MICROSCOPIC
BACTERIA UA: NONE SEEN
BILIRUBIN URINE: NEGATIVE
Glucose, UA: NEGATIVE mg/dL
Hgb urine dipstick: NEGATIVE
KETONES UR: NEGATIVE mg/dL
NITRITE: NEGATIVE
PROTEIN: NEGATIVE mg/dL
Specific Gravity, Urine: 1.003 — ABNORMAL LOW (ref 1.005–1.030)
Squamous Epithelial / LPF: NONE SEEN
pH: 8 (ref 5.0–8.0)

## 2015-12-18 LAB — HEPARIN LEVEL (UNFRACTIONATED): HEPARIN UNFRACTIONATED: 0.49 [IU]/mL (ref 0.30–0.70)

## 2015-12-18 LAB — CBC
HCT: 29.5 % — ABNORMAL LOW (ref 36.0–46.0)
HEMOGLOBIN: 9.6 g/dL — AB (ref 12.0–15.0)
MCH: 31.6 pg (ref 26.0–34.0)
MCHC: 32.5 g/dL (ref 30.0–36.0)
MCV: 97 fL (ref 78.0–100.0)
Platelets: 260 10*3/uL (ref 150–400)
RBC: 3.04 MIL/uL — ABNORMAL LOW (ref 3.87–5.11)
RDW: 15.7 % — AB (ref 11.5–15.5)
WBC: 5.7 10*3/uL (ref 4.0–10.5)

## 2015-12-18 LAB — CULTURE, BLOOD (ROUTINE X 2)
CULTURE: NO GROWTH
Culture: NO GROWTH

## 2015-12-18 MED ORDER — DOXYCYCLINE HYCLATE 100 MG PO TABS
100.0000 mg | ORAL_TABLET | Freq: Two times a day (BID) | ORAL | Status: DC
  Administered 2015-12-21: 100 mg via ORAL
  Filled 2015-12-18: qty 1

## 2015-12-18 NOTE — Progress Notes (Signed)
ANTICOAGULATION CONSULT NOTE  Pharmacy Consult for heparin Indication:  DVT  Patient Measurements: Height: 5\' 1"  (154.9 cm) Weight: 108 lb 4.8 oz (49.1 kg) IBW/kg (Calculated) : 47.8 Heparin Dosing Weight: 47 kg  Vital Signs: Temp: 96.9 F (36.1 C) (12/13 0923) Temp Source: Axillary (12/13 0923) BP: 150/78 (12/13 0923) Pulse Rate: 96 (12/13 0427)  Labs:  Recent Labs  12/16/15 0458 12/16/15 0904 12/17/15 0536 12/18/15 0236  HGB 9.3*  --  9.0* 9.6*  HCT 28.2*  --  27.9* 29.5*  PLT 225  --  218 260  HEPARINUNFRC  --  0.52 0.62 0.49  CREATININE 0.69  --   --  0.73    Estimated Creatinine Clearance: 38.8 mL/min (by C-G formula based on SCr of 0.73 mg/dL).  Assessment: 82 y.o. female with R popliteal artery aneurysm and DVT. Pharmacy has been consulted for heparin dosing. Per vascular surgery, OK to transition to PO anticoagulation and once acute right leg issues are resolved then surgery will be considered at outpatient follow-up.  Heparin level remains therapeutic at 0.49. Hgb low stable at 9.6, Plt wnl, no s/sx bleeding documented.   Goal of Therapy:  Heparin level 0.3-0.7 units/ml Monitor platelets by anticoagulation protocol: Yes   Plan:  -Continue heparin at 1250 units/hr -Daily HL, CBC -F/u transition to Xarelto vs Eliquis pending insurance coverage   Gwenlyn Perking, PharmD PGY1 Pharmacy Resident Pager: [phone] 12/18/2015 10:45 AM

## 2015-12-18 NOTE — Progress Notes (Signed)
[facility]                                                                              Patient Demographics  Bridget Ball, is a 82 y.o. female, DOB - May 26, 1928, [id number]  Admit date - 12/13/2015   Admitting Physician Elwin Mocha, MD  Outpatient Primary MD for the patient is [name], MD  Outpatient specialists:   LOS - 5  days    Chief Complaint  Patient presents with  . leg pain/cellulitis       Brief summary   82 y.o.femalewith medical history significant for COPD, dementia, history of seizures secondary to prior subarachnoid hemorrhage, anemia, dyslipidemia, GERD who was recently discharged on 11/23 from [facility] after being treated for right lower extremity cellulitis. Patient evaluated by vascular surgery and infectious disease. Vascular surgery plans intervention for  Popliteal Artery Aneurysm this week   Assessment & Plan    Recurrent Cellulitis of right lower leg -Patient presented with rapid onset and progression of significant right lower extremity erythema and edema with pain disproportionate to clinical findings; concerning for possible soft tissue necrotic process -Per Dr [name], orthopedics, no evidence of necrotizing fasciitis - Per ID, recommended vancomycin for 3 more days and then doxycycline for another week.  - Cortisol level within normal limits. Pro-calcitonin 6.87, CRP 8.1, lactic acid 2.02 - Blood cultures negative to date - CT scan showed 3.3 cm pseudoaneurysm-distal right superficial femoral artery/acute DVT extending into the anterior and posterior tibial veins - Carotid artery duplex showed 1-39 percent stenosis involving the right internal carotid artery and the left internal carotid artery - Cleared by cardiology for right femoral popliteal aneurysm repair.    Right popliteal artery aneurysm with DVT - Currently on a heparin drip as recommended by vascular surgery. Vascular surgery, she can start NOAC for  anticoagulation - Case management consult placed for insurance co-pays for xarelto vs eliquis  - per vasc surgery, once acute right leg issues are resolved then surgery will be considered at outpatient follow-up  Dehydration/AKI (acute kidney injury)  -  Creatinine stable, improved from 1.22 to 0.73 today  Seizure disorder secondary to prior [facility] -Continue preadmission Keppra -Seizure precautions  COPD (chronic obstructive pulmonary disease) -Currently stable without wheezing  -Continue oral budesonide  Dementia/depression -Daughter assists with patient's ADLs/IADLs at home -Continue preadmission Namenda and Celexa  Anemia -Baseline hemoglobin 9-10 - Hemoglobin currently at baseline    Dyslipidemia -Not on medications prior to admission  GERD (gastroesophageal reflux disease -Holding preadmission PPI and setting of sepsis physiology, need for broad-spectrum antibiotics i.e. wish to minimize risk of developing C. difficile colitis  Severe protein-calorie malnutrition  Megace discontinued due to increased risk of DVTs -Nutrition consultation  Code Status: DNR  DVT Prophylaxis:  heparin drip Family Communication: Discussed in detail with the patient, all imaging results, lab results explained to the patient   Disposition Plan: Hopefully DC in 1-2 days, transfer to telemetry floor  Time Spent in minutes   25 minutes  Procedures:  2-D echo   Consultants:   Vascular surgery  Cardiology ID  Antimicrobials:  IV vancomycin  Medications  Scheduled Meds: . atorvastatin  40 mg Oral q1800  . budesonide  6 mg Oral q morning - 10a  . citalopram  10 mg Oral Daily  . darifenacin  7.5 mg Oral Daily  . feeding supplement (ENSURE ENLIVE)  237 mL Oral BID BM  . ferrous sulfate  325 mg Oral Q breakfast  . levETIRAcetam  250 mg Oral BID  . magnesium oxide  400 mg Oral BID  . memantine  10 mg Oral BID  . sodium chloride flush  3 mL Intravenous Q12H    . tiotropium  1 capsule Inhalation Daily  . vancomycin  500 mg Intravenous Q24H   Continuous Infusions: . 0.9 % NaCl with KCl 40 mEq / L 50 mL/hr (12/17/15 2106)  . heparin 1,250 Units/hr (12/18/15 0829)   PRN Meds:.acetaminophen **OR** acetaminophen, albuterol, morphine injection   Antibiotics   Anti-infectives    Start     Dose/Rate Route Frequency Ordered Stop   12/16/15 2300  vancomycin (VANCOCIN) 500 mg in sodium chloride 0.9 % 100 mL IVPB     500 mg 100 mL/hr over 60 Minutes Intravenous Every 24 hours 12/16/15 1132     12/15/15 1400  vancomycin (VANCOCIN) IVPB 1000 mg/200 mL premix  Status:  Discontinued     1,000 mg 200 mL/hr over 60 Minutes Intravenous Every 48 hours 12/13/15 1328 12/16/15 1132   12/13/15 2300  aztreonam (AZACTAM) 1 g in dextrose 5 % 50 mL IVPB  Status:  Discontinued     1 g 100 mL/hr over 30 Minutes Intravenous Every 8 hours 12/13/15 1451 12/14/15 1526   12/13/15 2200  aztreonam (AZACTAM) 1 g in dextrose 5 % 50 mL IVPB  Status:  Discontinued     1 g 100 mL/hr over 30 Minutes Intravenous Every 8 hours 12/13/15 1321 12/13/15 1451   12/13/15 1330  metroNIDAZOLE (FLAGYL) IVPB 500 mg  Status:  Discontinued     500 mg 100 mL/hr over 60 Minutes Intravenous Every 8 hours 12/13/15 1304 12/14/15 1526   12/13/15 1330  vancomycin (VANCOCIN) IVPB 1000 mg/200 mL premix     1,000 mg 200 mL/hr over 60 Minutes Intravenous  Once 12/13/15 1306 12/13/15 1645   12/13/15 1315  aztreonam (AZACTAM) 1 g in dextrose 5 % 50 mL IVPB     1 g 100 mL/hr over 30 Minutes Intravenous  Once 12/13/15 1301 12/13/15 1549   12/13/15 1300  aztreonam (AZACTAM) 2 g in dextrose 5 % 50 mL IVPB  Status:  Discontinued     2 g 100 mL/hr over 30 Minutes Intravenous  Once 12/13/15 1251 12/13/15 1300   12/13/15 1300  vancomycin (VANCOCIN) IVPB 1000 mg/200 mL premix  Status:  Discontinued     1,000 mg 200 mL/hr over 60 Minutes Intravenous  Once 12/13/15 1251 12/13/15 1306        Subjective:    Bridget Ball was seen and examined today. Patient denies dizziness, chest pain, shortness of breath, abdominal pain, N/V/D/C, new weakness, numbess, tingling. No acute events overnight.    Objective:   Vitals:   12/18/15 0200 12/18/15 0427 12/18/15 0838 12/18/15 0923  BP: 119/71 131/84  (!) 150/78  Pulse: 72 96    Resp: (!) 9 20    Temp:  98.8 F (37.1 C)  (!) 96.9 F (36.1 C)  TempSrc:  Oral  Axillary  SpO2: 99% 99% 99%   Weight:      Height:        Intake/Output Summary (  Last 24 hours) at 12/18/15 1000 Last data filed at 12/18/15 [id number]  Gross per 24 hour  Intake          2128.25 ml  Output             2300 ml  Net          -171.75 ml     Wt Readings from Last 3 Encounters:  12/17/15 49.1 kg (108 lb 4.8 oz)  11/28/15 42 kg (92 lb 9.6 oz)  11/08/15 41.3 kg (91 lb)     Exam  General: Alert and oriented x 3, NAD  HEENT:  PERRLA, EOMI, Anicteric Sclera, mucous membranes moist.   Neck: Supple, no JVD  Cardiovascular: S1 S2 auscultated, no rubs, murmurs or gallops. Regular rate and rhythm.  Respiratory: Clear to auscultation bilaterally, no wheezing, rales or rhonchi  Gastrointestinal: Soft, nontender, nondistended, + bowel sounds  Ext: no cyanosis clubbing or edema, Chronic venous stasis changes  Neuro: AAOx3, Cr N's II- XII. Strength 5/5 upper and lower extremities bilaterally  Skin: No rashes  Psych: Normal affect and demeanor, alert and oriented x3    Data Reviewed:  I have personally reviewed following labs and imaging studies  Micro Results Recent Results (from the past 240 hour(s))  Blood Culture (routine x 2)     Status: None (Preliminary result)   Collection Time: 12/13/15 12:25 PM  Result Value Ref Range Status   Specimen Description BLOOD LEFT FOREARM  Final   Special Requests BOTTLES DRAWN AEROBIC AND ANAEROBIC 5CC  Final   Culture NO GROWTH 4 DAYS  Final   Report Status PENDING  Incomplete  Blood Culture (routine x 2)     Status: None  (Preliminary result)   Collection Time: 12/13/15  1:15 PM  Result Value Ref Range Status   Specimen Description BLOOD RIGHT ANTECUBITAL  Final   Special Requests BOTTLES DRAWN AEROBIC AND ANAEROBIC 5CC  Final   Culture NO GROWTH 4 DAYS  Final   Report Status PENDING  Incomplete  MRSA PCR Screening     Status: None   Collection Time: 12/13/15  5:56 PM  Result Value Ref Range Status   MRSA by PCR NEGATIVE NEGATIVE Final    Comment:        The GeneXpert MRSA Assay (FDA approved for NASAL specimens only), is one component of a comprehensive MRSA colonization surveillance program. It is not intended to diagnose MRSA infection nor to guide or monitor treatment for MRSA infections.   Urine culture     Status: Abnormal   Collection Time: 12/13/15  6:08 PM  Result Value Ref Range Status   Specimen Description URINE, RANDOM  Final   Special Requests NONE  Final   Culture MULTIPLE SPECIES PRESENT, SUGGEST RECOLLECTION (A)  Final   Report Status 12/14/2015 FINAL  Final    Radiology Reports Ct Femur Right W Contrast  Addendum Date: 12/13/2015   ADDENDUM REPORT: 12/13/2015 17:58 ADDENDUM: The original report was by Dr. Van Clines. The following addendum is by Dr. Van Clines: These results were called by telephone at the time of interpretation on 12/13/2015 at 5:56 pm to Dr. Doren Custard HOBBS, who verbally acknowledged these results. Electronically Signed   By: Van Clines M.D.   On: 12/13/2015 17:58   Result Date: 12/13/2015 CLINICAL DATA:  Right leg cellulitis, worsening.  Arthritis. EXAM: CT OF THE RIGHT FEMUR WITHOUT CONTRAST CT OF THE RIGHT TIBIA/ FIBULA WITHOUT CONTRAST TECHNIQUE: Multidetector CT imaging of the lower right extremity  was performed according to the standard protocol following intravenous contrast administration. COMPARISON:  None. CONTRAST:  78mL ISOVUE-300 IOPAMIDOL (ISOVUE-300) INJECTION 61% FINDINGS: RIGHT FEMUR: No findings of active osteomyelitis.  Moderate osteoarthritis of the knee with tricompartmental spurring and a trace knee effusion. There is a left ureteral stent extending into the urinary bladder. Small left inguinal lymph nodes are present. Atherosclerotic calcification in the common femoral artery and superficial femoral artery ; there is a saccular aneurysm of the SFA centered about 12 cm above the joint line of the knee, mild surrounding stranding, the aneurysm measures 3.3 by 2.8 by 2.7 cm and is partially thrombosed. There is partially occlusive deep vein thrombosis in the right popliteal vein extending down into the tibial veins. Subcutaneous edema laterally in the distal thigh. No abscess identified. RIGHT TIBIA/FIBULA: No findings of osteomyelitis. Subcutaneous edema fairly circumferentially in the calf and extending down to the ankle where it is again circumferential. This could reflect cellulitis but is nonspecific and there is no abscess or gas tracking in the soft tissues. No abnormal effacement of fat planes along the calf musculature. Aside from the clot in the popliteal vein, there is branching clot extending in the proximal aspects of the anterior tibial vein and posterior tibial vein. Atherosclerotic calcification of arterial structures noted, patent to the arterial structures is difficult to assess as today' s exam was not performed as an angiogram type of study, correlation with distal pulses recommended. IMPRESSION: 1. 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 At the upper margin of this aneurysm there is some narrowing of the artery. Urgent vascular surgical consultation recommended for further workup. 2. Likely acute popliteal vein partially occlusive DVT extending into the anterior and posterior tibial veins. This was not shown on the prior ultrasound exam from 11/25/2015. 3. Circumferential subcutaneous edema in the calf and ankle, along with  some edema in the subcutaneous tissues of the lateral thigh. Although cellulitis could possibly give this appearance, the appearance could also be related to the DVT, venous stasis, or other conditions. No abscess or findings suggesting myositis/fasciitis. 4. Moderate osteoarthritis of the knee with small knee effusion. Radiology assistant personnel have been notified to put me in telephone contact with the referring physician or the referring physician's clinical representative in order to discuss these findings. Once this communication is established I will issue an addendum to this report for documentation purposes. Electronically Signed: By: Van Clines M.D. On: 12/13/2015 17:25   Ct Tibia Fibula Right W Contrast  Addendum Date: 12/13/2015   ADDENDUM REPORT: 12/13/2015 17:58 ADDENDUM: The original report was by Dr. Van Clines. The following addendum is by Dr. Van Clines: These results were called by telephone at the time of interpretation on 12/13/2015 at 5:56 pm to Dr. Doren Custard HOBBS, who verbally acknowledged these results. Electronically Signed   By: Van Clines M.D.   On: 12/13/2015 17:58   Result Date: 12/13/2015 CLINICAL DATA:  Right leg cellulitis, worsening.  Arthritis. EXAM: CT OF THE RIGHT FEMUR WITHOUT CONTRAST CT OF THE RIGHT TIBIA/ FIBULA WITHOUT CONTRAST TECHNIQUE: Multidetector CT imaging of the lower right extremity was performed according to the standard protocol following intravenous contrast administration. COMPARISON:  None. CONTRAST:  38mL ISOVUE-300 IOPAMIDOL (ISOVUE-300) INJECTION 61% FINDINGS: RIGHT FEMUR: No findings of active osteomyelitis. Moderate osteoarthritis of the knee with tricompartmental spurring and a trace knee effusion. There is a left ureteral stent extending into the urinary bladder. Small left inguinal  lymph nodes are present. Atherosclerotic calcification in the common femoral artery and superficial femoral artery ; there is a saccular  aneurysm of the SFA centered about 12 cm above the joint line of the knee, mild surrounding stranding, the aneurysm measures 3.3 by 2.8 by 2.7 cm and is partially thrombosed. There is partially occlusive deep vein thrombosis in the right popliteal vein extending down into the tibial veins. Subcutaneous edema laterally in the distal thigh. No abscess identified. RIGHT TIBIA/FIBULA: No findings of osteomyelitis. Subcutaneous edema fairly circumferentially in the calf and extending down to the ankle where it is again circumferential. This could reflect cellulitis but is nonspecific and there is no abscess or gas tracking in the soft tissues. No abnormal effacement of fat planes along the calf musculature. Aside from the clot in the popliteal vein, there is branching clot extending in the proximal aspects of the anterior tibial vein and posterior tibial vein. Atherosclerotic calcification of arterial structures noted, patent to the arterial structures is difficult to assess as today' s exam was not performed as an angiogram type of study, correlation with distal pulses recommended. IMPRESSION: 1. 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 At the upper margin of this aneurysm there is some narrowing of the artery. Urgent vascular surgical consultation recommended for further workup. 2. Likely acute popliteal vein partially occlusive DVT extending into the anterior and posterior tibial veins. This was not shown on the prior ultrasound exam from 11/25/2015. 3. Circumferential subcutaneous edema in the calf and ankle, along with some edema in the subcutaneous tissues of the lateral thigh. Although cellulitis could possibly give this appearance, the appearance could also be related to the DVT, venous stasis, or other conditions. No abscess or findings suggesting myositis/fasciitis. 4. Moderate osteoarthritis of the knee with small knee effusion.  Radiology assistant personnel have been notified to put me in telephone contact with the referring physician or the referring physician's clinical representative in order to discuss these findings. Once this communication is established I will issue an addendum to this report for documentation purposes. Electronically Signed: By: Van Clines M.D. On: 12/13/2015 17:25   US Venous Img Lower Unilateral Right  Result Date: 11/25/2015 CLINICAL DATA:  Acute on chronic cellulitis EXAM: RIGHT LOWER EXTREMITY VENOUS DOPPLER ULTRASOUND TECHNIQUE: Gray-scale sonography with compression, as well as color and duplex ultrasound, were performed to evaluate the deep venous system from the level of the common femoral vein through the popliteal and proximal calf veins. COMPARISON:  11/03/2015 FINDINGS: Normal compressibility of the common femoral, superficial femoral, and popliteal veins, as well as the proximal calf veins. No filling defects to suggest DVT on grayscale or color Doppler imaging. Doppler waveforms show normal direction of venous flow, normal respiratory phasicity and response to augmentation. Visualized segments of the saphenous venous system normal in caliber and compressibility. Survey views of the contralateral common femoral vein are unremarkable. IMPRESSION: No evidence of  lower extremity deep vein thrombosis, right. Electronically Signed   By: Lucrezia Europe M.D.   On: 11/25/2015 10:51   US Arterial Seg Single  Result Date: 11/25/2015 CLINICAL DATA:  Bilateral toe ulcers. Right lower extremity cellulitis. EXAM: NONINVASIVE PHYSIOLOGIC VASCULAR STUDY OF BILATERAL LOWER EXTREMITIES TECHNIQUE: Evaluation of both lower extremities were performed at rest, including calculation of ankle-brachial indices with single level Doppler, pressure and pulse volume recording. COMPARISON:  None. FINDINGS: Right ABI:  1.46 Left ABI:  1.80 Right Lower Extremity: Normal triphasic  Doppler waveform in the right  posterior tibial artery. Left Lower Extremity: Triphasic Doppler waveforms in the left ankle. IMPRESSION: Ankle-brachial indices are markedly elevated. Findings suggest heavily calcified arteries. No significant abnormality involving the ankle waveforms. Electronically Signed   By: Markus Daft M.D.   On: 11/25/2015 10:26    Lab Data:  CBC:  Recent Labs Lab 12/13/15 1158  12/15/15 0131 12/15/15 1044 12/16/15 0458 12/17/15 0536 12/18/15 0236  WBC 13.3*  < > 15.2* 14.9* 10.9* 6.2 5.7  NEUTROABS 12.6*  --   --   --   --   --   --   HGB 12.3  < > 9.4* 10.1* 9.3* 9.0* 9.6*  HCT 37.8  < > 28.9* 31.2* 28.2* 27.9* 29.5*  MCV 96.9  < > 98.3 98.4 96.2 97.2 97.0  PLT 287  < > 193 219 225 218 260  < > = values in this interval not displayed. Basic Metabolic Panel:  Recent Labs Lab 12/13/15 1158 12/13/15 1759 12/14/15 0307 12/15/15 0131 12/16/15 0458 12/18/15 0236  NA 135  --  136 135 138 141  K 3.4*  --  3.4* 3.4* 3.9 4.1  CL 102  --  109 108 111 111  CO2 24  --  23 22 20* 21*  GLUCOSE 113*  --  95 89 117* 111*  BUN 13  --  9 7 11 9   CREATININE 1.22*  --  0.83 0.83 0.69 0.73  CALCIUM 9.2  --  7.9* 8.1* 8.1* 8.5*  MG  --  1.5*  --   --  1.8  --   PHOS  --  3.2  --   --   --   --    GFR: Estimated Creatinine Clearance: 38.8 mL/min (by C-G formula based on SCr of 0.73 mg/dL). Liver Function Tests:  Recent Labs Lab 12/13/15 1158 12/14/15 0307 12/15/15 0131 12/18/15 0236  AST 24 15 17 15   ALT 20 14 18 18   ALKPHOS 40 30* 45 36*  BILITOT 1.2 0.9 0.6 0.2*  PROT 6.0* 4.4* 5.1* 4.7*  ALBUMIN 3.5 2.4* 2.6* 2.4*   No results for input(s): LIPASE, AMYLASE in the last 168 hours. No results for input(s): AMMONIA in the last 168 hours. Coagulation Profile:  Recent Labs Lab 12/13/15 1600  INR 1.25   Cardiac Enzymes: No results for input(s): CKTOTAL, CKMB, CKMBINDEX, TROPONINI in the last 168 hours. BNP (last 3 results) No results for input(s): PROBNP in the last 8760  hours. HbA1C: No results for input(s): HGBA1C in the last 72 hours. CBG:  Recent Labs Lab 12/13/15 2127  GLUCAP 104*   Lipid Profile: No results for input(s): CHOL, HDL, LDLCALC, TRIG, CHOLHDL, LDLDIRECT in the last 72 hours. Thyroid Function Tests: No results for input(s): TSH, T4TOTAL, FREET4, T3FREE, THYROIDAB in the last 72 hours. Anemia Panel: No results for input(s): VITAMINB12, FOLATE, FERRITIN, TIBC, IRON, RETICCTPCT in the last 72 hours. Urine analysis:    Component Value Date/Time   COLORURINE STRAW (A) 12/18/2015 0812   APPEARANCEUR CLEAR 12/18/2015 0812   LABSPEC 1.003 (L) 12/18/2015 0812   PHURINE 8.0 12/18/2015 0812   GLUCOSEU NEGATIVE 12/18/2015 0812   HGBUR NEGATIVE 12/18/2015 0812   BILIRUBINUR NEGATIVE 12/18/2015 0812   KETONESUR NEGATIVE 12/18/2015 0812   PROTEINUR NEGATIVE 12/18/2015 0812   UROBILINOGEN 0.2 07/26/2013 2234   NITRITE NEGATIVE 12/18/2015 0812   LEUKOCYTESUR TRACE (A) 12/18/2015 [id number]     Elison Worrel M.D. [facility] 12/18/2015, 10:00 AM  Pager: [phone] Between  7am to 7pm - call Pager - [phone]  After 7pm go to [email or web address] - password [email or web address]  Call night coverage person covering after 7pm

## 2015-12-18 NOTE — Care Management Note (Addendum)
Case Management Note  Patient Details  Name: Bridget Ball MRN: [id number] Date of Birth: [birth year]  Subjective/Objective:  Pt lives alone but is planning to go to daughter's home when discharged and will stay with her as long as needed.  Per CMA, pt is currently in coverage gap for prescriptions, co-pay will be 41% of price when covered, prior [name] is required.  Provided pt with card for 30-day free trial of Xarelto.  CM called Margaret Pyle, Xarelto rep, and he will communicate with pt's PCP, Dr [name], and wll ensure that pt will be able to afford her Xarelto on an ongoing basis.  Provided pt with contact information for [facility] - pt to call to see if she qualifies for low-income subsidy.                              Expected Discharge Plan:  [place]  Discharge planning Services  CM Consult, Medication Assistance  Status of Service:  In process, will continue to follow  Girard Cooter, RN 12/18/2015, 3:05 PM

## 2015-12-18 NOTE — Progress Notes (Signed)
Subjective  -   Patient states her leg feels much better   Physical Exam:  Decreased redness to the right leg with decreased tenderness.  He remains well perfused.       Assessment/Plan:    Patient has received cardiology clearance for surgery She still has some discoloration in her leg, however this is markedly improved.  She continues on IV antibiotics which we transitioned to oral antibiotics.  I discussed with the patient and her son that I would like for her acute right leg issues to be resolved prior to proceeding with right femoral popliteal aneurysm repair.  I would recommend discharge to home once medically stable.  She can start a NOAC for anticoagulation.  I will schedule her to follow-up with me in several weeks for further discussions of surgery.   [name] 12/18/2015 8:09 AM --  Vitals:   12/18/15 0200 12/18/15 0427  BP: 119/71 131/84  Pulse: 72 96  Resp: (!) 9 20  Temp:  98.8 F (37.1 C)    Intake/Output Summary (Last 24 hours) at 12/18/15 0809 Last data filed at 12/18/15 [id number]  Gross per 24 hour  Intake          2248.25 ml  Output             2000 ml  Net           248.25 ml     Laboratory CBC    Component Value Date/Time   WBC 5.7 12/18/2015 0236   HGB 9.6 (L) 12/18/2015 0236   HCT 29.5 (L) 12/18/2015 0236   PLT 260 12/18/2015 0236    BMET    Component Value Date/Time   NA 141 12/18/2015 0236   NA 142 06/24/2015 1425   K 4.1 12/18/2015 0236   CL 111 12/18/2015 0236   CO2 21 (L) 12/18/2015 0236   GLUCOSE 111 (H) 12/18/2015 0236   BUN 9 12/18/2015 0236   BUN 16 06/24/2015 1425   CREATININE 0.73 12/18/2015 0236   CALCIUM 8.5 (L) 12/18/2015 0236   GFRNONAA >60 12/18/2015 0236   GFRAA >60 12/18/2015 0236    COAG Lab Results  Component Value Date   INR 1.25 12/13/2015   INR 0.92 11/24/2015   INR 1.00 07/28/2013   No results found for: PTT  Antibiotics Anti-infectives    Start     Dose/Rate Route Frequency Ordered  Stop   12/16/15 2300  vancomycin (VANCOCIN) 500 mg in sodium chloride 0.9 % 100 mL IVPB     500 mg 100 mL/hr over 60 Minutes Intravenous Every 24 hours 12/16/15 1132     12/15/15 1400  vancomycin (VANCOCIN) IVPB 1000 mg/200 mL premix  Status:  Discontinued     1,000 mg 200 mL/hr over 60 Minutes Intravenous Every 48 hours 12/13/15 1328 12/16/15 1132   12/13/15 2300  aztreonam (AZACTAM) 1 g in dextrose 5 % 50 mL IVPB  Status:  Discontinued     1 g 100 mL/hr over 30 Minutes Intravenous Every 8 hours 12/13/15 1451 12/14/15 1526   12/13/15 2200  aztreonam (AZACTAM) 1 g in dextrose 5 % 50 mL IVPB  Status:  Discontinued     1 g 100 mL/hr over 30 Minutes Intravenous Every 8 hours 12/13/15 1321 12/13/15 1451   12/13/15 1330  metroNIDAZOLE (FLAGYL) IVPB 500 mg  Status:  Discontinued     500 mg 100 mL/hr over 60 Minutes Intravenous Every 8 hours 12/13/15 1304 12/14/15 1526   12/13/15 1330  vancomycin (VANCOCIN) IVPB 1000 mg/200 mL premix     1,000 mg 200 mL/hr over 60 Minutes Intravenous  Once 12/13/15 1306 12/13/15 1645   12/13/15 1315  aztreonam (AZACTAM) 1 g in dextrose 5 % 50 mL IVPB     1 g 100 mL/hr over 30 Minutes Intravenous  Once 12/13/15 1301 12/13/15 1549   12/13/15 1300  aztreonam (AZACTAM) 2 g in dextrose 5 % 50 mL IVPB  Status:  Discontinued     2 g 100 mL/hr over 30 Minutes Intravenous  Once 12/13/15 1251 12/13/15 1300   12/13/15 1300  vancomycin (VANCOCIN) IVPB 1000 mg/200 mL premix  Status:  Discontinued     1,000 mg 200 mL/hr over 60 Minutes Intravenous  Once 12/13/15 1251 12/13/15 1306       V. Leia Alf, M.D. [facility] Office: [phone] Pager:  [phone]

## 2015-12-18 NOTE — Consult Note (Signed)
   [facility] CM Inpatient Consult   12/18/2015  DESEREA EIDSON 09/12/1928 [id number]   Chart reviewed. Ms. Makhoul is active with [place] [facility] program. [facility] RNCM already aware of admission. Spoke with Ms. Campusano at bedside to discuss [facility] following. She states, "my daughter usually takes care of those kind of things". Made her aware that [place] [facility] will continue to follow along and follow post hospital discharge. She endorses she will be having surgery as well. Contact information left at bedside. Mrs. Eanes, patient's daughter, not present during bedside encounter. Made inpatient RNCM on [facility] aware that [place] [facility] is active. Will make [place] referral if needed after reading prior stepdown unit inpatient RNCM's notes regarding medications.    Marthenia Rolling, MSN-Ed, RN,BSN [facility] Liaison [phone]

## 2015-12-19 ENCOUNTER — Other Ambulatory Visit: Payer: Self-pay | Admitting: *Deleted

## 2015-12-19 DIAGNOSIS — R569 Unspecified convulsions: Secondary | ICD-10-CM

## 2015-12-19 DIAGNOSIS — J42 Unspecified chronic bronchitis: Secondary | ICD-10-CM

## 2015-12-19 LAB — CBC
HCT: 33.1 % — ABNORMAL LOW (ref 36.0–46.0)
HEMOGLOBIN: 10.5 g/dL — AB (ref 12.0–15.0)
MCH: 31.4 pg (ref 26.0–34.0)
MCHC: 31.7 g/dL (ref 30.0–36.0)
MCV: 99.1 fL (ref 78.0–100.0)
Platelets: 286 10*3/uL (ref 150–400)
RBC: 3.34 MIL/uL — ABNORMAL LOW (ref 3.87–5.11)
RDW: 15.8 % — AB (ref 11.5–15.5)
WBC: 7.8 10*3/uL (ref 4.0–10.5)

## 2015-12-19 LAB — HEPARIN LEVEL (UNFRACTIONATED): HEPARIN UNFRACTIONATED: 0.59 [IU]/mL (ref 0.30–0.70)

## 2015-12-19 NOTE — Progress Notes (Signed)
ANTICOAGULATION CONSULT NOTE  Pharmacy Consult for heparin Indication:  DVT  Patient Measurements: Height: 5\' 1"  (154.9 cm) Weight: 95 lb 3.8 oz (43.2 kg) IBW/kg (Calculated) : 47.8 Heparin Dosing Weight: 47 kg  Vital Signs: Temp: 98.4 F (36.9 C) (12/14 0444) Temp Source: Oral (12/14 0444) BP: 142/78 (12/14 0444) Pulse Rate: 90 (12/14 0444)  Labs:  Recent Labs  12/17/15 0536 12/18/15 0236 12/19/15 0529  HGB 9.0* 9.6* 10.5*  HCT 27.9* 29.5* 33.1*  PLT 218 260 286  HEPARINUNFRC 0.62 0.49 0.59  CREATININE  --  0.73  --     Estimated Creatinine Clearance: 35.1 mL/min (by C-G formula based on SCr of 0.73 mg/dL).  Assessment: 82 y.o. female admitted on 12/13/2015 with cellulitis.    PMH: COPD, Dementia, Hx of seizures, SAH, GERD  Anticoag: On heparin gtt for right popliteal artery aneurysm with DVT, HL = 0.59 on 1250 units/hr - pending change to NOAC prior to dc  Nephro: Scr 0.73  Heme/Onc: H&H 10.5/33.1, Plt 286  Goal of Therapy:  Heparin level 0.3-0.7 units/ml Monitor platelets by anticoagulation protocol: Yes   Plan:  -Continue heparin at 1250 units/hr -Daily HL, CBC -F/u transition to Xarelto vs Eliquis pending insurance coverage  Levester Fresh, PharmD, BCPS, BCCCP Clinical Pharmacist Clinical phone for 12/19/2015 from 7a-3:30p: [phone] If after 3:30p, please call main pharmacy at: [phone] 12/19/2015 9:43 AM

## 2015-12-19 NOTE — Patient Outreach (Addendum)
[facility]) Care Management  12/19/2015  HEYAB GAUNTT [birth year] [id number]  [facility] Case Management continues to follow Mrs. Brizendine progress closely.   Plan: I will reach out to Mrs. Billiot within 48 hours after discharge and will plan close follow up via transition of care services.    [place] Management  [phone]

## 2015-12-19 NOTE — Progress Notes (Signed)
Nutrition Follow-up  DOCUMENTATION CODES:   Non-severe (moderate) malnutrition in context of chronic illness  INTERVENTION:  Continue Ensure Enlive po BID, each supplement provides 350 kcal and 20 grams of protein.   Encourage adequate PO intake.   NUTRITION DIAGNOSIS:   Malnutrition (Moderate) related to chronic illness as evidenced by severe depletion of body fat, moderate depletions of muscle mass; ongoing  GOAL:   Patient will meet greater than or equal to 90% of their needs; met  MONITOR:   PO intake, Supplement acceptance, Labs, Weight trends, Skin, I & O's  REASON FOR ASSESSMENT:   Consult Assessment of nutrition requirement/status  ASSESSMENT:   82 y.o. female with medical history significant for COPD, dementia, history of seizures secondary to prior subarachnoid hemorrhage, anemia, dyslipidemia, GERD who was recently discharged on 11/23 from [facility] after being treated for right lower extremity cellulitis.In the ER the patient had relative hypotension with sepsis physiology criteria met therefore ER initiated fluid challenges and other sepsis protocol orders. Per MD, Vascular surgery plans intervention for Popliteal Artery Aneurysm this week.  Meal completion has been 50%. Pt currently has Ensure to aid in caloric and protein needs and has been consuming them. RD to continue with current orders. Pt encouraged to eat her food at meals and to drink her supplements. Labs and medications reviewed.   Diet Order:  Diet regular Room service appropriate? Yes; Fluid consistency: Thin  Skin:  Wound (see comment) (cellulitis lower R leg)  Last BM:  12/13  Height:   Ht Readings from Last 1 Encounters:  12/13/15 5' 1"  (1.549 m)    Weight:   Wt Readings from Last 1 Encounters:  12/19/15 95 lb 3.8 oz (43.2 kg)    Ideal Body Weight:  47.7 kg  BMI:  Body mass index is 18 kg/m.  Estimated Nutritional Needs:   Kcal:  1400-1600  Protein:  60-75  grams  Fluid:  >/= 1.5 L/day  EDUCATION NEEDS:   No education needs identified at this time  Corrin Parker, MS, RD, LDN Pager # [phone] After hours/ weekend pager # [phone]

## 2015-12-19 NOTE — Progress Notes (Signed)
[facility]                                                                              Patient Demographics  Bridget Ball, is a 82 y.o. female, DOB - 03/04/1928, [id number]  Admit date - 12/13/2015   Admitting Physician Elwin Mocha, MD  Outpatient Primary MD for the patient is [name], MD  Outpatient specialists:   LOS - 6  days    Chief Complaint  Patient presents with  . leg pain/cellulitis       Brief summary   82 y.o.femalewith medical history significant for COPD, dementia, history of seizures secondary to prior subarachnoid hemorrhage, anemia, dyslipidemia, GERD who was recently discharged on 11/23 from [facility] after being treated for right lower extremity cellulitis. Patient evaluated by vascular surgery and infectious disease. Vascular surgery plans intervention for  Popliteal Artery Aneurysm when her infection improves.    Assessment & Plan    Recurrent Cellulitis of right lower leg -Patient presented with rapid onset and progression of significant right lower extremity erythema and edema with pain disproportionate to clinical findings; concerning for possible soft tissue necrotic process -Per Dr [name], orthopedics, no evidence of necrotizing fasciitis - Per ID, recommended vancomycin for 1 more day and then doxycycline for another week.  - Cortisol level within normal limits. Pro-calcitonin 6.87, CRP 8.1, lactic acid 2.02 - Blood cultures negative to date - CT scan showed 3.3 cm pseudoaneurysm-distal right superficial femoral artery/acute DVT extending into the anterior and posterior tibial veins - Carotid artery duplex showed 1-39 percent stenosis involving the right internal carotid artery and the left internal carotid artery - Cleared by cardiology for right femoral popliteal aneurysm repair.  - pt and family reports persistent pain.    Right popliteal artery aneurysm with DVT - Currently on a heparin drip as recommended  by vascular surgery. Vascular surgery, recommended she can start NOAC for anticoagulation - Case management consult placed for insurance co-pays for xarelto vs eliquis . Patient and family does nto want to use xarelto as they cannot afford it.  - per vasc surgery, once acute right leg issues are resolved then surgery will be considered at outpatient follow-up  Dehydration/AKI (acute kidney injury)  -  Creatinine stable, improved from 1.22 to 0.73 .  - no new labs today.   Seizure disorder secondary to prior SAH -Continue preadmission Keppra -Seizure precautions - no new seizures  COPD (chronic obstructive pulmonary disease) -Currently stable without wheezing  -Continue oral budesonide  Dementia/depression -Daughter assists with patient's ADLs/IADLs at home -Continue preadmission Namenda and Celexa  Anemia -Baseline hemoglobin 9-10 - Hemoglobin currently at baseline    Dyslipidemia -Not on medications prior to admission  GERD (gastroesophageal reflux disease -stable.   Severe protein-calorie malnutrition  Megace discontinued due to increased risk of DVTs -Nutrition consultation  Code Status: DNR  DVT Prophylaxis:  heparin drip Family Communication: discussed with daughter at bedside.   Disposition Plan: Hopefully DC in 1-2 days,  Time Spent in minutes   25 minutes  Procedures:  2-D echo   Consultants:   Vascular surgery  Cardiology ID  Antimicrobials:  IV vancomycin  Medications  Scheduled Meds: . atorvastatin  40 mg Oral q1800  . budesonide  6 mg Oral q morning - 10a  . citalopram  10 mg Oral Daily  . darifenacin  7.5 mg Oral Daily  . [START ON 12/21/2015] doxycycline  100 mg Oral Q12H  . feeding supplement (ENSURE ENLIVE)  237 mL Oral BID BM  . ferrous sulfate  325 mg Oral Q breakfast  . levETIRAcetam  250 mg Oral BID  . magnesium oxide  400 mg Oral BID  . memantine  10 mg Oral BID  . sodium chloride flush  3 mL Intravenous  Q12H  . tiotropium  1 capsule Inhalation Daily  . vancomycin  500 mg Intravenous Q24H   Continuous Infusions: . 0.9 % NaCl with KCl 40 mEq / L 50 mL/hr (12/19/15 1535)  . heparin 1,250 Units/hr (12/19/15 0530)   PRN Meds:.acetaminophen **OR** acetaminophen, albuterol, morphine injection   Antibiotics   Anti-infectives    Start     Dose/Rate Route Frequency Ordered Stop   12/21/15 1000  doxycycline (VIBRA-TABS) tablet 100 mg     100 mg Oral Every 12 hours 12/18/15 1035 12/28/15 0959   12/16/15 2300  vancomycin (VANCOCIN) 500 mg in sodium chloride 0.9 % 100 mL IVPB     500 mg 100 mL/hr over 60 Minutes Intravenous Every 24 hours 12/16/15 1132 12/20/15 2359   12/15/15 1400  vancomycin (VANCOCIN) IVPB 1000 mg/200 mL premix  Status:  Discontinued     1,000 mg 200 mL/hr over 60 Minutes Intravenous Every 48 hours 12/13/15 1328 12/16/15 1132   12/13/15 2300  aztreonam (AZACTAM) 1 g in dextrose 5 % 50 mL IVPB  Status:  Discontinued     1 g 100 mL/hr over 30 Minutes Intravenous Every 8 hours 12/13/15 1451 12/14/15 1526   12/13/15 2200  aztreonam (AZACTAM) 1 g in dextrose 5 % 50 mL IVPB  Status:  Discontinued     1 g 100 mL/hr over 30 Minutes Intravenous Every 8 hours 12/13/15 1321 12/13/15 1451   12/13/15 1330  metroNIDAZOLE (FLAGYL) IVPB 500 mg  Status:  Discontinued     500 mg 100 mL/hr over 60 Minutes Intravenous Every 8 hours 12/13/15 1304 12/14/15 1526   12/13/15 1330  vancomycin (VANCOCIN) IVPB 1000 mg/200 mL premix     1,000 mg 200 mL/hr over 60 Minutes Intravenous  Once 12/13/15 1306 12/13/15 1645   12/13/15 1315  aztreonam (AZACTAM) 1 g in dextrose 5 % 50 mL IVPB     1 g 100 mL/hr over 30 Minutes Intravenous  Once 12/13/15 1301 12/13/15 1549   12/13/15 1300  aztreonam (AZACTAM) 2 g in dextrose 5 % 50 mL IVPB  Status:  Discontinued     2 g 100 mL/hr over 30 Minutes Intravenous  Once 12/13/15 1251 12/13/15 1300   12/13/15 1300  vancomycin (VANCOCIN) IVPB 1000 mg/200 mL premix   Status:  Discontinued     1,000 mg 200 mL/hr over 60 Minutes Intravenous  Once 12/13/15 1251 12/13/15 1306        Subjective:   Bridget Ball was seen and examined today. Patient reports persistent pain behind the right knee and in the right calf.   Objective:   Vitals:   12/18/15 1540 12/18/15 2048 12/19/15 0444 12/19/15 1100  BP: 106/65 131/73 (!) 142/78 124/60  Pulse: 88 92 90 98  Resp: 18 16 16 18   Temp: 98 F (36.7 C) 98.2 F (36.8 C) 98.4 F (36.9  C) 98.4 F (36.9 C)  TempSrc: Oral Oral Oral Oral  SpO2: 98% 97% 97% 98%  Weight:   43.2 kg (95 lb 3.8 oz)   Height:        Intake/Output Summary (Last 24 hours) at 12/19/15 1853 Last data filed at 12/19/15 1300  Gross per 24 hour  Intake          1207.09 ml  Output                0 ml  Net          1207.09 ml     Wt Readings from Last 3 Encounters:  12/19/15 43.2 kg (95 lb 3.8 oz)  11/28/15 42 kg (92 lb 9.6 oz)  11/08/15 41.3 kg (91 lb)     Exam  General: Alert and oriented x 3, NAD  HEENT:  PERRLA, EOMI, Anicteric Sclera, mucous membranes moist.   Neck: Supple, no JVD  Cardiovascular: S1 S2 auscultated, no rubs, murmurs or gallops. Regular rate and rhythm.  Respiratory: Clear to auscultation bilaterally, no wheezing, rales or rhonchi  Gastrointestinal: Soft, nontender, nondistended, + bowel sounds  Ext: no cyanosis clubbing or edema, Chronic venous stasis changes  Neuro: AAOx3, Cr N's II- XII. Strength 5/5 upper and lower extremities bilaterally  Skin: No rashes  Psych: Normal affect and demeanor, alert and oriented x3    Data Reviewed:  I have personally reviewed following labs and imaging studies  Micro Results Recent Results (from the past 240 hour(s))  Blood Culture (routine x 2)     Status: None   Collection Time: 12/13/15 12:25 PM  Result Value Ref Range Status   Specimen Description BLOOD LEFT FOREARM  Final   Special Requests BOTTLES DRAWN AEROBIC AND ANAEROBIC 5CC  Final   Culture  NO GROWTH 5 DAYS  Final   Report Status 12/18/2015 FINAL  Final  Blood Culture (routine x 2)     Status: None   Collection Time: 12/13/15  1:15 PM  Result Value Ref Range Status   Specimen Description BLOOD RIGHT ANTECUBITAL  Final   Special Requests BOTTLES DRAWN AEROBIC AND ANAEROBIC 5CC  Final   Culture NO GROWTH 5 DAYS  Final   Report Status 12/18/2015 FINAL  Final  MRSA PCR Screening     Status: None   Collection Time: 12/13/15  5:56 PM  Result Value Ref Range Status   MRSA by PCR NEGATIVE NEGATIVE Final    Comment:        The GeneXpert MRSA Assay (FDA approved for NASAL specimens only), is one component of a comprehensive MRSA colonization surveillance program. It is not intended to diagnose MRSA infection nor to guide or monitor treatment for MRSA infections.   Urine culture     Status: Abnormal   Collection Time: 12/13/15  6:08 PM  Result Value Ref Range Status   Specimen Description URINE, RANDOM  Final   Special Requests NONE  Final   Culture MULTIPLE SPECIES PRESENT, SUGGEST RECOLLECTION (A)  Final   Report Status 12/14/2015 FINAL  Final  Culture, Urine     Status: Abnormal (Preliminary result)   Collection Time: 12/18/15  8:12 AM  Result Value Ref Range Status   Specimen Description URINE, RANDOM  Final   Special Requests NONE  Final   Culture (A)  Final    >=100,000 COLONIES/mL GRAM NEGATIVE RODS IDENTIFICATION AND SUSCEPTIBILITIES TO FOLLOW    Report Status PENDING  Incomplete    Radiology Reports Ct Femur Right  W Contrast  Addendum Date: 12/13/2015   ADDENDUM REPORT: 12/13/2015 17:58 ADDENDUM: The original report was by Dr. Van Clines. The following addendum is by Dr. Van Clines: These results were called by telephone at the time of interpretation on 12/13/2015 at 5:56 pm to Dr. Doren Custard HOBBS, who verbally acknowledged these results. Electronically Signed   By: Van Clines M.D.   On: 12/13/2015 17:58   Result Date: 12/13/2015 CLINICAL  DATA:  Right leg cellulitis, worsening.  Arthritis. EXAM: CT OF THE RIGHT FEMUR WITHOUT CONTRAST CT OF THE RIGHT TIBIA/ FIBULA WITHOUT CONTRAST TECHNIQUE: Multidetector CT imaging of the lower right extremity was performed according to the standard protocol following intravenous contrast administration. COMPARISON:  None. CONTRAST:  65mL ISOVUE-300 IOPAMIDOL (ISOVUE-300) INJECTION 61% FINDINGS: RIGHT FEMUR: No findings of active osteomyelitis. Moderate osteoarthritis of the knee with tricompartmental spurring and a trace knee effusion. There is a left ureteral stent extending into the urinary bladder. Small left inguinal lymph nodes are present. Atherosclerotic calcification in the common femoral artery and superficial femoral artery ; there is a saccular aneurysm of the SFA centered about 12 cm above the joint line of the knee, mild surrounding stranding, the aneurysm measures 3.3 by 2.8 by 2.7 cm and is partially thrombosed. There is partially occlusive deep vein thrombosis in the right popliteal vein extending down into the tibial veins. Subcutaneous edema laterally in the distal thigh. No abscess identified. RIGHT TIBIA/FIBULA: No findings of osteomyelitis. Subcutaneous edema fairly circumferentially in the calf and extending down to the ankle where it is again circumferential. This could reflect cellulitis but is nonspecific and there is no abscess or gas tracking in the soft tissues. No abnormal effacement of fat planes along the calf musculature. Aside from the clot in the popliteal vein, there is branching clot extending in the proximal aspects of the anterior tibial vein and posterior tibial vein. Atherosclerotic calcification of arterial structures noted, patent to the arterial structures is difficult to assess as today' s exam was not performed as an angiogram type of study, correlation with distal pulses recommended. IMPRESSION: 1. 3.3 cm pseudoaneurysm or saccular aneurysm of the distal right  superficial femoral artery, with mild surrounding stranding. At the upper margin of this aneurysm there is some narrowing of the artery. Urgent vascular surgical consultation recommended for further workup. 2. Likely acute popliteal vein partially occlusive DVT extending into the anterior and posterior tibial veins. This was not shown on the prior ultrasound exam from 11/25/2015. 3. Circumferential subcutaneous edema in the calf and ankle, along with some edema in the subcutaneous tissues of the lateral thigh. Although cellulitis could possibly give this appearance, the appearance could also be related to the DVT, venous stasis, or other conditions. No abscess or findings suggesting myositis/fasciitis. 4. Moderate osteoarthritis of the knee with small knee effusion. Radiology assistant personnel have been notified to put me in telephone contact with the referring physician or the referring physician's clinical representative in order to discuss these findings. Once this communication is established I will issue an addendum to this report for documentation purposes. Electronically Signed: By: Van Clines M.D. On: 12/13/2015 17:25   Ct Tibia Fibula Right W Contrast  Addendum Date: 12/13/2015   ADDENDUM REPORT: 12/13/2015 17:58 ADDENDUM: The original report was by Dr. Van Clines. The following addendum is by Dr. Van Clines: These results were called by telephone at the time of interpretation on 12/13/2015 at 5:56 pm to Dr. Doren Custard HOBBS, who verbally acknowledged these results. Electronically Signed  By: Van Clines M.D.   On: 12/13/2015 17:58   Result Date: 12/13/2015 CLINICAL DATA:  Right leg cellulitis, worsening.  Arthritis. EXAM: CT OF THE RIGHT FEMUR WITHOUT CONTRAST CT OF THE RIGHT TIBIA/ FIBULA WITHOUT CONTRAST TECHNIQUE: Multidetector CT imaging of the lower right extremity was performed according to the standard protocol following intravenous contrast administration.  COMPARISON:  None. CONTRAST:  44mL ISOVUE-300 IOPAMIDOL (ISOVUE-300) INJECTION 61% FINDINGS: RIGHT FEMUR: No findings of active osteomyelitis. Moderate osteoarthritis of the knee with tricompartmental spurring and a trace knee effusion. There is a left ureteral stent extending into the urinary bladder. Small left inguinal lymph nodes are present. Atherosclerotic calcification in the common femoral artery and superficial femoral artery ; there is a saccular aneurysm of the SFA centered about 12 cm above the joint line of the knee, mild surrounding stranding, the aneurysm measures 3.3 by 2.8 by 2.7 cm and is partially thrombosed. There is partially occlusive deep vein thrombosis in the right popliteal vein extending down into the tibial veins. Subcutaneous edema laterally in the distal thigh. No abscess identified. RIGHT TIBIA/FIBULA: No findings of osteomyelitis. Subcutaneous edema fairly circumferentially in the calf and extending down to the ankle where it is again circumferential. This could reflect cellulitis but is nonspecific and there is no abscess or gas tracking in the soft tissues. No abnormal effacement of fat planes along the calf musculature. Aside from the clot in the popliteal vein, there is branching clot extending in the proximal aspects of the anterior tibial vein and posterior tibial vein. Atherosclerotic calcification of arterial structures noted, patent to the arterial structures is difficult to assess as today' s exam was not performed as an angiogram type of study, correlation with distal pulses recommended. IMPRESSION: 1. 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 At the upper margin of this aneurysm there is some narrowing of the artery. Urgent vascular surgical consultation recommended for further workup. 2. Likely acute popliteal vein partially occlusive DVT extending into the anterior and posterior tibial  veins. This was not shown on the prior ultrasound exam from 11/25/2015. 3. Circumferential subcutaneous edema in the calf and ankle, along with some edema in the subcutaneous tissues of the lateral thigh. Although cellulitis could possibly give this appearance, the appearance could also be related to the DVT, venous stasis, or other conditions. No abscess or findings suggesting myositis/fasciitis. 4. Moderate osteoarthritis of the knee with small knee effusion. Radiology assistant personnel have been notified to put me in telephone contact with the referring physician or the referring physician's clinical representative in order to discuss these findings. Once this communication is established I will issue an addendum to this report for documentation purposes. Electronically Signed: By: Van Clines M.D. On: 12/13/2015 17:25   US Venous Img Lower Unilateral Right  Result Date: 11/25/2015 CLINICAL DATA:  Acute on chronic cellulitis EXAM: RIGHT LOWER EXTREMITY VENOUS DOPPLER ULTRASOUND TECHNIQUE: Gray-scale sonography with compression, as well as color and duplex ultrasound, were performed to evaluate the deep venous system from the level of the common femoral vein through the popliteal and proximal calf veins. COMPARISON:  11/03/2015 FINDINGS: Normal compressibility of the common femoral, superficial femoral, and popliteal veins, as well as the proximal calf veins. No filling defects to suggest DVT on grayscale or color Doppler imaging. Doppler waveforms show normal direction of venous flow, normal respiratory phasicity and response to augmentation. Visualized segments of the saphenous venous system normal in caliber and  compressibility. Survey views of the contralateral common femoral vein are unremarkable. IMPRESSION: No evidence of  lower extremity deep vein thrombosis, right. Electronically Signed   By: Lucrezia Europe M.D.   On: 11/25/2015 10:51   US Arterial Seg Single  Result Date:  11/25/2015 CLINICAL DATA:  Bilateral toe ulcers. Right lower extremity cellulitis. EXAM: NONINVASIVE PHYSIOLOGIC VASCULAR STUDY OF BILATERAL LOWER EXTREMITIES TECHNIQUE: Evaluation of both lower extremities were performed at rest, including calculation of ankle-brachial indices with single level Doppler, pressure and pulse volume recording. COMPARISON:  None. FINDINGS: Right ABI:  1.46 Left ABI:  1.80 Right Lower Extremity: Normal triphasic Doppler waveform in the right posterior tibial artery. Left Lower Extremity: Triphasic Doppler waveforms in the left ankle. IMPRESSION: Ankle-brachial indices are markedly elevated. Findings suggest heavily calcified arteries. No significant abnormality involving the ankle waveforms. Electronically Signed   By: Markus Daft M.D.   On: 11/25/2015 10:26    Lab Data:  CBC:  Recent Labs Lab 12/13/15 1158  12/15/15 1044 12/16/15 0458 12/17/15 0536 12/18/15 0236 12/19/15 0529  WBC 13.3*  < > 14.9* 10.9* 6.2 5.7 7.8  NEUTROABS 12.6*  --   --   --   --   --   --   HGB 12.3  < > 10.1* 9.3* 9.0* 9.6* 10.5*  HCT 37.8  < > 31.2* 28.2* 27.9* 29.5* 33.1*  MCV 96.9  < > 98.4 96.2 97.2 97.0 99.1  PLT 287  < > 219 225 218 260 286  < > = values in this interval not displayed. Basic Metabolic Panel:  Recent Labs Lab 12/13/15 1158 12/13/15 1759 12/14/15 0307 12/15/15 0131 12/16/15 0458 12/18/15 0236  NA 135  --  136 135 138 141  K 3.4*  --  3.4* 3.4* 3.9 4.1  CL 102  --  109 108 111 111  CO2 24  --  23 22 20* 21*  GLUCOSE 113*  --  95 89 117* 111*  BUN 13  --  9 7 11 9   CREATININE 1.22*  --  0.83 0.83 0.69 0.73  CALCIUM 9.2  --  7.9* 8.1* 8.1* 8.5*  MG  --  1.5*  --   --  1.8  --   PHOS  --  3.2  --   --   --   --    GFR: Estimated Creatinine Clearance: 35.1 mL/min (by C-G formula based on SCr of 0.73 mg/dL). Liver Function Tests:  Recent Labs Lab 12/13/15 1158 12/14/15 0307 12/15/15 0131 12/18/15 0236  AST 24 15 17 15   ALT 20 14 18 18   ALKPHOS  40 30* 45 36*  BILITOT 1.2 0.9 0.6 0.2*  PROT 6.0* 4.4* 5.1* 4.7*  ALBUMIN 3.5 2.4* 2.6* 2.4*   No results for input(s): LIPASE, AMYLASE in the last 168 hours. No results for input(s): AMMONIA in the last 168 hours. Coagulation Profile:  Recent Labs Lab 12/13/15 1600  INR 1.25   Cardiac Enzymes: No results for input(s): CKTOTAL, CKMB, CKMBINDEX, TROPONINI in the last 168 hours. BNP (last 3 results) No results for input(s): PROBNP in the last 8760 hours. HbA1C: No results for input(s): HGBA1C in the last 72 hours. CBG:  Recent Labs Lab 12/13/15 2127  GLUCAP 104*   Lipid Profile: No results for input(s): CHOL, HDL, LDLCALC, TRIG, CHOLHDL, LDLDIRECT in the last 72 hours. Thyroid Function Tests: No results for input(s): TSH, T4TOTAL, FREET4, T3FREE, THYROIDAB in the last 72 hours. Anemia Panel: No results for input(s): VITAMINB12, FOLATE, FERRITIN,  TIBC, IRON, RETICCTPCT in the last 72 hours. Urine analysis:    Component Value Date/Time   COLORURINE STRAW (A) 12/18/2015 0812   APPEARANCEUR CLEAR 12/18/2015 0812   LABSPEC 1.003 (L) 12/18/2015 0812   PHURINE 8.0 12/18/2015 0812   GLUCOSEU NEGATIVE 12/18/2015 0812   HGBUR NEGATIVE 12/18/2015 0812   BILIRUBINUR NEGATIVE 12/18/2015 0812   KETONESUR NEGATIVE 12/18/2015 0812   PROTEINUR NEGATIVE 12/18/2015 0812   UROBILINOGEN 0.2 07/26/2013 2234   NITRITE NEGATIVE 12/18/2015 0812   LEUKOCYTESUR TRACE (A) 12/18/2015 [id number]     Andranik Jeune M.D. [facility] 12/19/2015, 6:53 PM  Pager: [phone]  After 7pm go to [email or web address] - password [email or web address]  Call night coverage person covering after 7pm

## 2015-12-20 LAB — CBC
HCT: 32.7 % — ABNORMAL LOW (ref 36.0–46.0)
Hemoglobin: 10.6 g/dL — ABNORMAL LOW (ref 12.0–15.0)
MCH: 31.5 pg (ref 26.0–34.0)
MCHC: 32.4 g/dL (ref 30.0–36.0)
MCV: 97 fL (ref 78.0–100.0)
PLATELETS: 337 10*3/uL (ref 150–400)
RBC: 3.37 MIL/uL — ABNORMAL LOW (ref 3.87–5.11)
RDW: 16.1 % — AB (ref 11.5–15.5)
WBC: 7.8 10*3/uL (ref 4.0–10.5)

## 2015-12-20 LAB — URINE CULTURE

## 2015-12-20 LAB — HEPARIN LEVEL (UNFRACTIONATED): Heparin Unfractionated: 0.57 IU/mL (ref 0.30–0.70)

## 2015-12-20 NOTE — Care Management (Signed)
Case manager explained to patient and her daughter about the insurance gap and the that she has received discount card for Xarelto that will get her medication until the first of the year. Also informed Dr. Karleen Hampshire that patient will need prior authorization for the refills. Patient requesting a dollar amount for her copay and CM has explained that pharmacist is unable to give that prior to running prescription after she has exhausted her 30 day free card. Ricki Miller, RN BSN Case Manager [phone].

## 2015-12-20 NOTE — Progress Notes (Signed)
[facility]                                                                              Patient Demographics  Bridget Ball, is a 82 y.o. female, DOB - [birth year], [id number]  Admit date - 12/13/2015   Admitting Physician Elwin Mocha, MD  Outpatient Primary MD for the patient is [name], MD  Outpatient specialists:   LOS - 7  days    Chief Complaint  Patient presents with  . leg pain/cellulitis       Brief summary   82 y.o.femalewith medical history significant for COPD, dementia, history of seizures secondary to prior subarachnoid hemorrhage, anemia, dyslipidemia, GERD who was recently discharged on 11/23 from [facility] after being treated for right lower extremity cellulitis. Patient evaluated by vascular surgery and infectious disease. Vascular surgery plans intervention for  Popliteal Artery Aneurysm when her infection improves.    Assessment & Plan    Recurrent Cellulitis of right lower leg -Patient presented with rapid onset and progression of significant right lower extremity erythema and edema with pain disproportionate to clinical findings; concerning for possible soft tissue necrotic process -Per Dr [name], orthopedics, no evidence of necrotizing fasciitis - Per ID, recommended vancomycin today and then doxycycline for another week.  - Cortisol level within normal limits. Pro-calcitonin 6.87, CRP 8.1, lactic acid 2.02 - Blood cultures negative to date - CT scan showed 3.3 cm pseudoaneurysm-distal right superficial femoral artery/acute DVT extending into the anterior and posterior tibial veins - Carotid artery duplex showed 1-39 percent stenosis involving the right internal carotid artery and the left internal carotid artery - Cleared by cardiology for right femoral popliteal aneurysm repair.  - pt doing better today.    Right popliteal artery aneurysm with DVT - Currently on a heparin drip as recommended by vascular surgery.  Vascular surgery, recommended she can start NOAC for anticoagulation - Case management consult placed for insurance co-pays for xarelto vs eliquis . Patient and family does nto want to use xarelto as they are not sure how much its going to cost them. They received a month free card for xarelto.  - per vasc surgery, once acute right leg issues are resolved then surgery will be considered at outpatient follow-up  Dehydration/AKI (acute kidney injury)  -  Creatinine stable, improved from 1.22 to 0.73 .  - no new labs today.   Seizure disorder secondary to prior SAH -Continue preadmission Keppra -Seizure precautions - no new seizures  COPD (chronic obstructive pulmonary disease) -Currently stable without wheezing  -Continue oral budesonide  Dementia/depression -Daughter assists with patient's ADLs/IADLs at home -Continue preadmission Namenda and Celexa  Anemia -Baseline hemoglobin 9-10 - Hemoglobin currently at baseline    Dyslipidemia -Not on medications prior to admission  GERD (gastroesophageal reflux disease -stable.   Severe protein-calorie malnutrition  Megace discontinued due to increased risk of DVTs -Nutrition consultation  Code Status: DNR  DVT Prophylaxis:  heparin drip Family Communication: discussed with daughter at bedside.   Disposition Plan: Hopefully DC tomorrow. Time Spent in minutes   25 minutes  Procedures:  2-D echo   Consultants:  Vascular surgery  Cardiology ID  Antimicrobials:  IV vancomycin  Medications  Scheduled Meds: . atorvastatin  40 mg Oral q1800  . budesonide  6 mg Oral q morning - 10a  . citalopram  10 mg Oral Daily  . darifenacin  7.5 mg Oral Daily  . [START ON 12/21/2015] doxycycline  100 mg Oral Q12H  . feeding supplement (ENSURE ENLIVE)  237 mL Oral BID BM  . ferrous sulfate  325 mg Oral Q breakfast  . levETIRAcetam  250 mg Oral BID  . magnesium oxide  400 mg Oral BID  . memantine  10 mg Oral  BID  . sodium chloride flush  3 mL Intravenous Q12H  . tiotropium  1 capsule Inhalation Daily  . vancomycin  500 mg Intravenous Q24H   Continuous Infusions: . 0.9 % NaCl with KCl 40 mEq / L 50 mL/hr (12/19/15 1535)  . heparin 1,250 Units/hr (12/20/15 0454)   PRN Meds:.acetaminophen **OR** acetaminophen, albuterol, morphine injection   Antibiotics   Anti-infectives    Start     Dose/Rate Route Frequency Ordered Stop   12/21/15 1000  doxycycline (VIBRA-TABS) tablet 100 mg     100 mg Oral Every 12 hours 12/18/15 1035 12/28/15 0959   12/16/15 2300  vancomycin (VANCOCIN) 500 mg in sodium chloride 0.9 % 100 mL IVPB     500 mg 100 mL/hr over 60 Minutes Intravenous Every 24 hours 12/16/15 1132 12/20/15 2359   12/15/15 1400  vancomycin (VANCOCIN) IVPB 1000 mg/200 mL premix  Status:  Discontinued     1,000 mg 200 mL/hr over 60 Minutes Intravenous Every 48 hours 12/13/15 1328 12/16/15 1132   12/13/15 2300  aztreonam (AZACTAM) 1 g in dextrose 5 % 50 mL IVPB  Status:  Discontinued     1 g 100 mL/hr over 30 Minutes Intravenous Every 8 hours 12/13/15 1451 12/14/15 1526   12/13/15 2200  aztreonam (AZACTAM) 1 g in dextrose 5 % 50 mL IVPB  Status:  Discontinued     1 g 100 mL/hr over 30 Minutes Intravenous Every 8 hours 12/13/15 1321 12/13/15 1451   12/13/15 1330  metroNIDAZOLE (FLAGYL) IVPB 500 mg  Status:  Discontinued     500 mg 100 mL/hr over 60 Minutes Intravenous Every 8 hours 12/13/15 1304 12/14/15 1526   12/13/15 1330  vancomycin (VANCOCIN) IVPB 1000 mg/200 mL premix     1,000 mg 200 mL/hr over 60 Minutes Intravenous  Once 12/13/15 1306 12/13/15 1645   12/13/15 1315  aztreonam (AZACTAM) 1 g in dextrose 5 % 50 mL IVPB     1 g 100 mL/hr over 30 Minutes Intravenous  Once 12/13/15 1301 12/13/15 1549   12/13/15 1300  aztreonam (AZACTAM) 2 g in dextrose 5 % 50 mL IVPB  Status:  Discontinued     2 g 100 mL/hr over 30 Minutes Intravenous  Once 12/13/15 1251 12/13/15 1300   12/13/15 1300   vancomycin (VANCOCIN) IVPB 1000 mg/200 mL premix  Status:  Discontinued     1,000 mg 200 mL/hr over 60 Minutes Intravenous  Once 12/13/15 1251 12/13/15 1306        Subjective:   Bridget Ball was seen and examined today. Sitting in chair, denies any chest pain , sob .   Objective:   Vitals:   12/20/15 0533 12/20/15 0929 12/20/15 1233 12/20/15 1437  BP: 104/75  107/63   Pulse: 76  93   Resp: 16  16   Temp: 99.2 F (37.3 C)  98.9 F (  37.2 C)   TempSrc: Oral  Oral   SpO2: 96% 98% 97% 95%  Weight: 44.6 kg (98 lb 5.2 oz)     Height:        Intake/Output Summary (Last 24 hours) at 12/20/15 1658 Last data filed at 12/20/15 0858  Gross per 24 hour  Intake              240 ml  Output                0 ml  Net              240 ml     Wt Readings from Last 3 Encounters:  12/20/15 44.6 kg (98 lb 5.2 oz)  11/28/15 42 kg (92 lb 9.6 oz)  11/08/15 41.3 kg (91 lb)     Exam  General: Alert and oriented x 3, NAD  Cardiovascular: S1 S2 auscultated, no rubs, murmurs or gallops. Regular rate and rhythm.  Respiratory: Clear to auscultation bilaterally, no wheezing, rales or rhonchi  Gastrointestinal: Soft, nontender, nondistended, + bowel sounds  Ext: no cyanosis clubbing or edema, Chronic venous stasis changes, tender calf on the right.  Neuro: AAOx3, Cr N's II- XII. Strength 5/5 upper and lower extremities bilaterally  Skin: No rashes  Psych: Normal affect and demeanor, alert and oriented x3    Data Reviewed:  I have personally reviewed following labs and imaging studies  Micro Results Recent Results (from the past 240 hour(s))  Blood Culture (routine x 2)     Status: None   Collection Time: 12/13/15 12:25 PM  Result Value Ref Range Status   Specimen Description BLOOD LEFT FOREARM  Final   Special Requests BOTTLES DRAWN AEROBIC AND ANAEROBIC 5CC  Final   Culture NO GROWTH 5 DAYS  Final   Report Status 12/18/2015 FINAL  Final  Blood Culture (routine x 2)     Status:  None   Collection Time: 12/13/15  1:15 PM  Result Value Ref Range Status   Specimen Description BLOOD RIGHT ANTECUBITAL  Final   Special Requests BOTTLES DRAWN AEROBIC AND ANAEROBIC 5CC  Final   Culture NO GROWTH 5 DAYS  Final   Report Status 12/18/2015 FINAL  Final  MRSA PCR Screening     Status: None   Collection Time: 12/13/15  5:56 PM  Result Value Ref Range Status   MRSA by PCR NEGATIVE NEGATIVE Final    Comment:        The GeneXpert MRSA Assay (FDA approved for NASAL specimens only), is one component of a comprehensive MRSA colonization surveillance program. It is not intended to diagnose MRSA infection nor to guide or monitor treatment for MRSA infections.   Urine culture     Status: Abnormal   Collection Time: 12/13/15  6:08 PM  Result Value Ref Range Status   Specimen Description URINE, RANDOM  Final   Special Requests NONE  Final   Culture MULTIPLE SPECIES PRESENT, SUGGEST RECOLLECTION (A)  Final   Report Status 12/14/2015 FINAL  Final  Culture, Urine     Status: Abnormal   Collection Time: 12/18/15  8:12 AM  Result Value Ref Range Status   Specimen Description URINE, RANDOM  Final   Special Requests NONE  Final   Culture (A)  Final    >=100,000 COLONIES/mL ESCHERICHIA COLI >=100,000 COLONIES/mL PSEUDOMONAS AERUGINOSA    Report Status 12/20/2015 FINAL  Final   Organism ID, Bacteria ESCHERICHIA COLI (A)  Final   Organism ID, Bacteria PSEUDOMONAS  AERUGINOSA (A)  Final      Susceptibility   Escherichia coli - MIC*    AMPICILLIN >=32 RESISTANT Resistant     CEFAZOLIN <=4 SENSITIVE Sensitive     CEFTRIAXONE <=1 SENSITIVE Sensitive     CIPROFLOXACIN >=4 RESISTANT Resistant     GENTAMICIN <=1 SENSITIVE Sensitive     IMIPENEM <=0.25 SENSITIVE Sensitive     NITROFURANTOIN <=16 SENSITIVE Sensitive     TRIMETH/SULFA <=20 SENSITIVE Sensitive     AMPICILLIN/SULBACTAM 16 INTERMEDIATE Intermediate     PIP/TAZO <=4 SENSITIVE Sensitive     Extended ESBL NEGATIVE  Sensitive     * >=100,000 COLONIES/mL ESCHERICHIA COLI   Pseudomonas aeruginosa - MIC*    CEFTAZIDIME 4 SENSITIVE Sensitive     CIPROFLOXACIN 0.5 SENSITIVE Sensitive     GENTAMICIN <=1 SENSITIVE Sensitive     IMIPENEM 1 SENSITIVE Sensitive     PIP/TAZO 8 SENSITIVE Sensitive     CEFEPIME 4 SENSITIVE Sensitive     * >=100,000 COLONIES/mL PSEUDOMONAS AERUGINOSA    Radiology Reports Ct Femur Right W Contrast  Addendum Date: 12/13/2015   ADDENDUM REPORT: 12/13/2015 17:58 ADDENDUM: The original report was by Dr. Van Clines. The following addendum is by Dr. Van Clines: These results were called by telephone at the time of interpretation on 12/13/2015 at 5:56 pm to Dr. Doren Custard HOBBS, who verbally acknowledged these results. Electronically Signed   By: Van Clines M.D.   On: 12/13/2015 17:58   Result Date: 12/13/2015 CLINICAL DATA:  Right leg cellulitis, worsening.  Arthritis. EXAM: CT OF THE RIGHT FEMUR WITHOUT CONTRAST CT OF THE RIGHT TIBIA/ FIBULA WITHOUT CONTRAST TECHNIQUE: Multidetector CT imaging of the lower right extremity was performed according to the standard protocol following intravenous contrast administration. COMPARISON:  None. CONTRAST:  81mL ISOVUE-300 IOPAMIDOL (ISOVUE-300) INJECTION 61% FINDINGS: RIGHT FEMUR: No findings of active osteomyelitis. Moderate osteoarthritis of the knee with tricompartmental spurring and a trace knee effusion. There is a left ureteral stent extending into the urinary bladder. Small left inguinal lymph nodes are present. Atherosclerotic calcification in the common femoral artery and superficial femoral artery ; there is a saccular aneurysm of the SFA centered about 12 cm above the joint line of the knee, mild surrounding stranding, the aneurysm measures 3.3 by 2.8 by 2.7 cm and is partially thrombosed. There is partially occlusive deep vein thrombosis in the right popliteal vein extending down into the tibial veins. Subcutaneous edema  laterally in the distal thigh. No abscess identified. RIGHT TIBIA/FIBULA: No findings of osteomyelitis. Subcutaneous edema fairly circumferentially in the calf and extending down to the ankle where it is again circumferential. This could reflect cellulitis but is nonspecific and there is no abscess or gas tracking in the soft tissues. No abnormal effacement of fat planes along the calf musculature. Aside from the clot in the popliteal vein, there is branching clot extending in the proximal aspects of the anterior tibial vein and posterior tibial vein. Atherosclerotic calcification of arterial structures noted, patent to the arterial structures is difficult to assess as today' s exam was not performed as an angiogram type of study, correlation with distal pulses recommended. IMPRESSION: 1. 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 At the upper margin of this aneurysm there is some narrowing of the artery. Urgent vascular surgical consultation recommended for further workup. 2. Likely acute popliteal vein partially occlusive DVT extending into the anterior and posterior tibial veins. This was not shown  on the prior ultrasound exam from 11/25/2015. 3. Circumferential subcutaneous edema in the calf and ankle, along with some edema in the subcutaneous tissues of the lateral thigh. Although cellulitis could possibly give this appearance, the appearance could also be related to the DVT, venous stasis, or other conditions. No abscess or findings suggesting myositis/fasciitis. 4. Moderate osteoarthritis of the knee with small knee effusion. Radiology assistant personnel have been notified to put me in telephone contact with the referring physician or the referring physician's clinical representative in order to discuss these findings. Once this communication is established I will issue an addendum to this report for documentation purposes.  Electronically Signed: By: Van Clines M.D. On: 12/13/2015 17:25   Ct Tibia Fibula Right W Contrast  Addendum Date: 12/13/2015   ADDENDUM REPORT: 12/13/2015 17:58 ADDENDUM: The original report was by Dr. Van Clines. The following addendum is by Dr. Van Clines: These results were called by telephone at the time of interpretation on 12/13/2015 at 5:56 pm to Dr. Doren Custard HOBBS, who verbally acknowledged these results. Electronically Signed   By: Van Clines M.D.   On: 12/13/2015 17:58   Result Date: 12/13/2015 CLINICAL DATA:  Right leg cellulitis, worsening.  Arthritis. EXAM: CT OF THE RIGHT FEMUR WITHOUT CONTRAST CT OF THE RIGHT TIBIA/ FIBULA WITHOUT CONTRAST TECHNIQUE: Multidetector CT imaging of the lower right extremity was performed according to the standard protocol following intravenous contrast administration. COMPARISON:  None. CONTRAST:  74mL ISOVUE-300 IOPAMIDOL (ISOVUE-300) INJECTION 61% FINDINGS: RIGHT FEMUR: No findings of active osteomyelitis. Moderate osteoarthritis of the knee with tricompartmental spurring and a trace knee effusion. There is a left ureteral stent extending into the urinary bladder. Small left inguinal lymph nodes are present. Atherosclerotic calcification in the common femoral artery and superficial femoral artery ; there is a saccular aneurysm of the SFA centered about 12 cm above the joint line of the knee, mild surrounding stranding, the aneurysm measures 3.3 by 2.8 by 2.7 cm and is partially thrombosed. There is partially occlusive deep vein thrombosis in the right popliteal vein extending down into the tibial veins. Subcutaneous edema laterally in the distal thigh. No abscess identified. RIGHT TIBIA/FIBULA: No findings of osteomyelitis. Subcutaneous edema fairly circumferentially in the calf and extending down to the ankle where it is again circumferential. This could reflect cellulitis but is nonspecific and there is no abscess or gas tracking in  the soft tissues. No abnormal effacement of fat planes along the calf musculature. Aside from the clot in the popliteal vein, there is branching clot extending in the proximal aspects of the anterior tibial vein and posterior tibial vein. Atherosclerotic calcification of arterial structures noted, patent to the arterial structures is difficult to assess as today' s exam was not performed as an angiogram type of study, correlation with distal pulses recommended. IMPRESSION: 1. 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 At the upper margin of this aneurysm there is some narrowing of the artery. Urgent vascular surgical consultation recommended for further workup. 2. Likely acute popliteal vein partially occlusive DVT extending into the anterior and posterior tibial veins. This was not shown on the prior ultrasound exam from 11/25/2015. 3. Circumferential subcutaneous edema in the calf and ankle, along with some edema in the subcutaneous tissues of the lateral thigh. Although cellulitis could possibly give this appearance, the appearance could also be related to the DVT, venous stasis, or other conditions. No abscess or findings suggesting myositis/fasciitis. 4. Moderate  osteoarthritis of the knee with small knee effusion. Radiology assistant personnel have been notified to put me in telephone contact with the referring physician or the referring physician's clinical representative in order to discuss these findings. Once this communication is established I will issue an addendum to this report for documentation purposes. Electronically Signed: By: Van Clines M.D. On: 12/13/2015 17:25   US Venous Img Lower Unilateral Right  Result Date: 11/25/2015 CLINICAL DATA:  Acute on chronic cellulitis EXAM: RIGHT LOWER EXTREMITY VENOUS DOPPLER ULTRASOUND TECHNIQUE: Gray-scale sonography with compression, as well as color and duplex  ultrasound, were performed to evaluate the deep venous system from the level of the common femoral vein through the popliteal and proximal calf veins. COMPARISON:  11/03/2015 FINDINGS: Normal compressibility of the common femoral, superficial femoral, and popliteal veins, as well as the proximal calf veins. No filling defects to suggest DVT on grayscale or color Doppler imaging. Doppler waveforms show normal direction of venous flow, normal respiratory phasicity and response to augmentation. Visualized segments of the saphenous venous system normal in caliber and compressibility. Survey views of the contralateral common femoral vein are unremarkable. IMPRESSION: No evidence of  lower extremity deep vein thrombosis, right. Electronically Signed   By: Lucrezia Europe M.D.   On: 11/25/2015 10:51   US Arterial Seg Single  Result Date: 11/25/2015 CLINICAL DATA:  Bilateral toe ulcers. Right lower extremity cellulitis. EXAM: NONINVASIVE PHYSIOLOGIC VASCULAR STUDY OF BILATERAL LOWER EXTREMITIES TECHNIQUE: Evaluation of both lower extremities were performed at rest, including calculation of ankle-brachial indices with single level Doppler, pressure and pulse volume recording. COMPARISON:  None. FINDINGS: Right ABI:  1.46 Left ABI:  1.80 Right Lower Extremity: Normal triphasic Doppler waveform in the right posterior tibial artery. Left Lower Extremity: Triphasic Doppler waveforms in the left ankle. IMPRESSION: Ankle-brachial indices are markedly elevated. Findings suggest heavily calcified arteries. No significant abnormality involving the ankle waveforms. Electronically Signed   By: Markus Daft M.D.   On: 11/25/2015 10:26    Lab Data:  CBC:  Recent Labs Lab 12/16/15 0458 12/17/15 0536 12/18/15 0236 12/19/15 0529 12/20/15 0500  WBC 10.9* 6.2 5.7 7.8 7.8  HGB 9.3* 9.0* 9.6* 10.5* 10.6*  HCT 28.2* 27.9* 29.5* 33.1* 32.7*  MCV 96.2 97.2 97.0 99.1 97.0  PLT 225 218 260 286 [id number]   Basic Metabolic Panel:  Recent  Labs Lab 12/13/15 1759 12/14/15 0307 12/15/15 0131 12/16/15 0458 12/18/15 0236  NA  --  136 135 138 141  K  --  3.4* 3.4* 3.9 4.1  CL  --  109 108 111 111  CO2  --  23 22 20* 21*  GLUCOSE  --  95 89 117* 111*  BUN  --  9 7 11 9   CREATININE  --  0.83 0.83 0.69 0.73  CALCIUM  --  7.9* 8.1* 8.1* 8.5*  MG 1.5*  --   --  1.8  --   PHOS 3.2  --   --   --   --    GFR: Estimated Creatinine Clearance: 36.2 mL/min (by C-G formula based on SCr of 0.73 mg/dL). Liver Function Tests:  Recent Labs Lab 12/14/15 0307 12/15/15 0131 12/18/15 0236  AST 15 17 15   ALT 14 18 18   ALKPHOS 30* 45 36*  BILITOT 0.9 0.6 0.2*  PROT 4.4* 5.1* 4.7*  ALBUMIN 2.4* 2.6* 2.4*   No results for input(s): LIPASE, AMYLASE in the last 168 hours. No results for input(s): AMMONIA in the last 168 hours. Coagulation  Profile: No results for input(s): INR, PROTIME in the last 168 hours. Cardiac Enzymes: No results for input(s): CKTOTAL, CKMB, CKMBINDEX, TROPONINI in the last 168 hours. BNP (last 3 results) No results for input(s): PROBNP in the last 8760 hours. HbA1C: No results for input(s): HGBA1C in the last 72 hours. CBG:  Recent Labs Lab 12/13/15 2127  GLUCAP 104*   Lipid Profile: No results for input(s): CHOL, HDL, LDLCALC, TRIG, CHOLHDL, LDLDIRECT in the last 72 hours. Thyroid Function Tests: No results for input(s): TSH, T4TOTAL, FREET4, T3FREE, THYROIDAB in the last 72 hours. Anemia Panel: No results for input(s): VITAMINB12, FOLATE, FERRITIN, TIBC, IRON, RETICCTPCT in the last 72 hours. Urine analysis:    Component Value Date/Time   COLORURINE STRAW (A) 12/18/2015 0812   APPEARANCEUR CLEAR 12/18/2015 0812   LABSPEC 1.003 (L) 12/18/2015 0812   PHURINE 8.0 12/18/2015 0812   GLUCOSEU NEGATIVE 12/18/2015 0812   HGBUR NEGATIVE 12/18/2015 0812   BILIRUBINUR NEGATIVE 12/18/2015 0812   KETONESUR NEGATIVE 12/18/2015 0812   PROTEINUR NEGATIVE 12/18/2015 0812   UROBILINOGEN 0.2 07/26/2013 2234     NITRITE NEGATIVE 12/18/2015 0812   LEUKOCYTESUR TRACE (A) 12/18/2015 [id number]     Jamarl Pew M.D. [facility] 12/20/2015, 4:58 PM  Pager: [phone]  After 7pm go to [email or web address] - password [email or web address]  Call night coverage person covering after 7pm

## 2015-12-20 NOTE — Progress Notes (Signed)
ANTICOAGULATION CONSULT NOTE - Follow Up Consult  Pharmacy Consult for heparin Indication: DVT  Patient Measurements: Height: 5\' 1"  (154.9 cm) Weight: 98 lb 5.2 oz (44.6 kg) IBW/kg (Calculated) : 47.8 Heparin Dosing Weight: 47  Vital Signs: Temp: 99.2 F (37.3 C) (12/15 0533) Temp Source: Oral (12/15 0533) BP: 104/75 (12/15 0533) Pulse Rate: 76 (12/15 0533)  Labs:  Recent Labs  12/18/15 0236 12/19/15 0529 12/20/15 0500  HGB 9.6* 10.5* 10.6*  HCT 29.5* 33.1* 32.7*  PLT 260 286 337  HEPARINUNFRC 0.49 0.59 0.57  CREATININE 0.73  --   --     Estimated Creatinine Clearance: 36.2 mL/min (by C-G formula based on SCr of 0.73 mg/dL).  Assessment: 82 y.o. female admitted on 12/13/2015 with cellulitis.    PMH: COPD, Dementia, Hx of seizures, SAH, GERD  Anticoag: On heparin gtt for right popliteal artery aneurysm with DVT, HL = 0.57 on 1250 units/hr, Hg stable, pltc wnl.   Pending change to NOAC prior to dc, options for insurance coverage being assessed.  Goal of Therapy:  Heparin level 0.3-0.7 units/ml Monitor platelets by anticoagulation protocol: Yes   Plan:  Continue Heparin 1250 units/hr Daily HL, CBC F/U plans for change to [place], PharmD Clinical Pharmacist [facility]

## 2015-12-21 LAB — CBC
HCT: 33.5 % — ABNORMAL LOW (ref 36.0–46.0)
Hemoglobin: 10.9 g/dL — ABNORMAL LOW (ref 12.0–15.0)
MCH: 32 pg (ref 26.0–34.0)
MCHC: 32.5 g/dL (ref 30.0–36.0)
MCV: 98.2 fL (ref 78.0–100.0)
PLATELETS: 343 10*3/uL (ref 150–400)
RBC: 3.41 MIL/uL — ABNORMAL LOW (ref 3.87–5.11)
RDW: 16.5 % — ABNORMAL HIGH (ref 11.5–15.5)
WBC: 7.9 10*3/uL (ref 4.0–10.5)

## 2015-12-21 LAB — HEPARIN LEVEL (UNFRACTIONATED)
Heparin Unfractionated: 0.83 IU/mL — ABNORMAL HIGH (ref 0.30–0.70)
Heparin Unfractionated: 0.91 IU/mL — ABNORMAL HIGH (ref 0.30–0.70)

## 2015-12-21 MED ORDER — CIPROFLOXACIN HCL 500 MG PO TABS: 500.0000 mg | ORAL_TABLET | Freq: Two times a day (BID) | ORAL | 0 refills | Status: DC

## 2015-12-21 MED ORDER — ENSURE ENLIVE PO LIQD: 237.0000 mL | Freq: Two times a day (BID) | ORAL | 12 refills | Status: AC

## 2015-12-21 MED ORDER — MAGNESIUM OXIDE 400 (241.3 MG) MG PO TABS: 400.0000 mg | ORAL_TABLET | Freq: Two times a day (BID) | ORAL | 0 refills | Status: DC

## 2015-12-21 MED ORDER — RIVAROXABAN 20 MG PO TABS: 20.0000 mg | ORAL_TABLET | Freq: Every day | ORAL | 0 refills | Status: DC

## 2015-12-21 MED ORDER — RIVAROXABAN 15 MG PO TABS: 15.0000 mg | ORAL_TABLET | Freq: Two times a day (BID) | ORAL | 0 refills | Status: DC

## 2015-12-21 MED ORDER — RIVAROXABAN 15 MG PO TABS
15.0000 mg | ORAL_TABLET | Freq: Two times a day (BID) | ORAL | Status: DC
  Administered 2015-12-21: 15 mg via ORAL
  Filled 2015-12-21 (×2): qty 1

## 2015-12-21 MED ORDER — DOXYCYCLINE HYCLATE 100 MG PO TABS: 100.0000 mg | ORAL_TABLET | Freq: Two times a day (BID) | ORAL | 0 refills | Status: DC

## 2015-12-21 NOTE — Plan of Care (Signed)
Problem: Pain Managment: Goal: General experience of comfort will improve Outcome: Progressing Medicated once for pain with full relief  Problem: Physical Regulation: Goal: Will remain free from infection Outcome: Progressing No signs of infection noted  Problem: Tissue Perfusion: Goal: Risk factors for ineffective tissue perfusion will decrease Outcome: Progressing On heparin drip   Problem: Activity: Goal: Risk for activity intolerance will decrease Outcome: Progressing OOB to [facility] with assistance, tolerates activities well  Problem: Bowel/Gastric: Goal: Will not experience complications related to bowel motility Outcome: Progressing No issues reported, had a BM on 12/20/2015

## 2015-12-21 NOTE — Progress Notes (Addendum)
ANTICOAGULATION CONSULT NOTE - Initial Consult  Pharmacy Consult for Rivaroxaban Indication: DVT  Patient Measurements: Height: 5\' 1"  (154.9 cm) Weight: 98 lb 1.7 oz (44.5 kg) IBW/kg (Calculated) : 47.8  Vital Signs: Temp: 98.4 F (36.9 C) (12/16 0357) Temp Source: Oral (12/16 0357) BP: 127/60 (12/16 0357) Pulse Rate: 78 (12/16 0357)  Labs:  Recent Labs  12/19/15 0529 12/20/15 0500 12/21/15 0445  HGB 10.5* 10.6* 10.9*  HCT 33.1* 32.7* 33.5*  PLT 286 337 343  HEPARINUNFRC 0.59 0.57 0.83*    Estimated Creatinine Clearance: 36.1 mL/min (by C-G formula based on SCr of 0.73 mg/dL).   Medical History: Past Medical History:  Diagnosis Date  . Anxiety   . Arthritis   . Attention to nephrostomy ([place])    PT HAS NEPHROSTOMY TUBE IN PLACE  . Bruises easily   . Cancer [facility])    Skin cancer  . Chronic diarrhea   . Colitis   . COPD (chronic obstructive pulmonary disease) ([place])   . Dementia   . Depression   . GERD (gastroesophageal reflux disease)   . Hiatal hernia   . Hx of bronchitis   . Hx of pulmonary edema JULY 2015  . Hx of pulmonary edema JULY 2015  . Hx of septic shock JULY 2015  . Hydronephrosis, left   . Hyperlipidemia   . Memory difficulties   . Microscopic colitis   . Myocardial infarction    unknown time   . Renal insufficiency   . Seizures ([place])    last seizure was 4-5 years ago with "brain bleed"  . Shortness of breath    with exertion  . Stroke [facility])    TIA four years ago / stroke NOV 2014    Medications:  Scheduled:  . atorvastatin  40 mg Oral q1800  . budesonide  6 mg Oral q morning - 10a  . citalopram  10 mg Oral Daily  . darifenacin  7.5 mg Oral Daily  . doxycycline  100 mg Oral Q12H  . feeding supplement (ENSURE ENLIVE)  237 mL Oral BID BM  . ferrous sulfate  325 mg Oral Q breakfast  . levETIRAcetam  250 mg Oral BID  . magnesium oxide  400 mg Oral BID  . memantine  10 mg Oral BID  . sodium chloride flush  3 mL Intravenous Q12H  .  tiotropium  1 capsule Inhalation Daily    Assessment: 87yof with DVT, on heparin infusion and now to change to Xarelto.  CrCl 35-58ml/min.  No bleeding.  Goal of Therapy:  treatment of DVT Monitor platelets by anticoagulation protocol: Yes   Plan:  Xarelto 15mg  po bid x 21 days, then 20mg  daily Consider using reduced dose of Xarelto 15mg  daily after 3wks bid regimen, per [place] Criteria, due to age and renal function D/C heparin at time of first dose of Xarelto D/C all heparin labs Xarelto education  Gracy Bruins, PharmD Clinical Pharmacist [facility]   12/21/15  Heparin level 0.91 Will have RN stop heparin and give first dose of Xarelto 1hr afterward.  Gracy Bruins, PharmD Clinical Pharmacist [facility]

## 2015-12-21 NOTE — Progress Notes (Signed)
Reviewed discharge information/medication with patient and patient's daughter.  Answered all of their questions.  Patient will be ready for discharge once xarelto is given.

## 2015-12-21 NOTE — Progress Notes (Signed)
ANTICOAGULATION CONSULT NOTE - Follow Up Consult  Pharmacy Consult for heparin Indication: DVT  Patient Measurements: Height: 5\' 1"  (154.9 cm) Weight: 98 lb 5.2 oz (44.6 kg) IBW/kg (Calculated) : 47.8 Heparin Dosing Weight: 47  Vital Signs: Temp: 98.4 F (36.9 C) (12/16 0357) Temp Source: Oral (12/16 0357) BP: 127/60 (12/16 0357) Pulse Rate: 78 (12/16 0357)  Labs:  Recent Labs  12/19/15 0529 12/20/15 0500 12/21/15 0445  HGB 10.5* 10.6* 10.9*  HCT 33.1* 32.7* 33.5*  PLT 286 337 343  HEPARINUNFRC 0.59 0.57 0.83*    Estimated Creatinine Clearance: 36.2 mL/min (by C-G formula based on SCr of 0.73 mg/dL).  Assessment: 82 y.o. female on heparin gtt for right popliteal artery aneurysm with DVT. Heparin level supratherapeutic (0.83) on gtt at 1250 units/hr. CBC stable. No bleeding noted. Pending change to NOAC prior to dc, options for insurance coverage being assessed.  Goal of Therapy:  Heparin level 0.3-0.7 units/ml Monitor platelets by anticoagulation protocol: Yes   Plan:  Decrease heparin to 1150 units/hr Will f/u 8 hr heparin level F/U plans for change to [place], PharmD, BCPS Clinical pharmacist, pager [phone] 12/21/2015 5:56 AM

## 2015-12-21 NOTE — Discharge Instructions (Signed)
Information on my medicine - XARELTO (rivaroxaban)  This medication education was reviewed with me or my healthcare representative as part of my discharge preparation.  The pharmacist that spoke with me during my hospital stay was:  Jaquita Folds, [place]? Xarelto was prescribed to treat blood clots that may have been found in the veins of your legs (deep vein thrombosis) or in your lungs (pulmonary embolism) and to reduce the risk of them occurring again.  What do you need to know about Xarelto? The starting dose is one 15 mg tablet taken TWICE daily with food for the FIRST 21 DAYS then on Jan 11, 2016  the dose is changed to one 20 mg tablet taken ONCE A DAY with your evening meal.  DO NOT stop taking Xarelto without talking to the health care provider who prescribed the medication.  Refill your prescription for 20 mg tablets before you run out.  After discharge, you should have regular check-up appointments with your healthcare provider that is prescribing your Xarelto.  In the future your dose may need to be changed if your kidney function changes by a significant amount.  What do you do if you miss a dose? If you are taking Xarelto TWICE DAILY and you miss a dose, take it as soon as you remember. You may take two 15 mg tablets (total 30 mg) at the same time then resume your regularly scheduled 15 mg twice daily the next day.  If you are taking Xarelto ONCE DAILY and you miss a dose, take it as soon as you remember on the same day then continue your regularly scheduled once daily regimen the next day. Do not take two doses of Xarelto at the same time.   Important Safety Information Xarelto is a blood thinner medicine that can cause bleeding. You should call your healthcare provider right away if you experience any of the following: ? Bleeding from an injury or your nose that does not stop. ? Unusual colored urine (red or dark brown) or unusual  colored stools (red or black). ? Unusual bruising for unknown reasons. ? A serious fall or if you hit your head (even if there is no bleeding).  Some medicines may interact with Xarelto and might increase your risk of bleeding while on Xarelto. To help avoid this, consult your healthcare provider or pharmacist prior to using any new prescription or non-prescription medications, including herbals, vitamins, non-steroidal anti-inflammatory drugs (NSAIDs) and supplements.  This website has more information on Xarelto: [email or web address]

## 2015-12-25 ENCOUNTER — Encounter: Payer: Self-pay | Admitting: *Deleted

## 2015-12-26 ENCOUNTER — Other Ambulatory Visit: Payer: Self-pay | Admitting: *Deleted

## 2015-12-26 NOTE — Discharge Summary (Signed)
Physician Discharge Summary  KALYNDA POPAL [id number] DOB: [birth year] DOA: 12/13/2015  PCP: [name], MD  Admit date: 12/13/2015 Discharge date: 12/21/2015  Admitted From:Home.  Disposition:  HOme.   Recommendations for Outpatient Follow-up:  1. Follow up with PCP in 1-2 weeks 2. Please obtain BMP/CBC in one week 3. Please follow up with vascular surgery as recommended.     Discharge Condition:stable.  CODE STATUS: full code.  Diet recommendation: Heart Healthy   Brief/Interim Summary: 82 y.o.femalewith medical history significant for COPD, dementia, history of seizures secondary to prior subarachnoid hemorrhage, anemia, dyslipidemia, GERD who was recently discharged on 11/23 from [facility] after being treated for right lower extremity cellulitis.Patient evaluated by vascular surgery and infectious disease. Vascular surgery plans intervention for Popliteal Artery Aneurysm when her infection improves.   Discharge Diagnoses:  Active Problems:   Seizure disorder secondary to prior SAH   Dehydration   AKI (acute kidney injury) ([place])   COPD (chronic obstructive pulmonary disease) (HCC)   Anemia   Dyslipidemia   Erythema of lower extremity   Dementia   GERD (gastroesophageal reflux disease)   Severe protein-calorie malnutrition (HCC)   DVT of popliteal vein (HCC)   Aneurysm of left internal iliac artery (HCC)   Malnutrition of moderate degree   Recurrent Cellulitis of right lower leg -Patient presented with rapid onset and progression of significant right lower extremity erythema and edema with pain disproportionate to clinical findings; concerning for possible soft tissue necrotic process -Per Dr [name], orthopedics, no evidence of necrotizing fasciitis - Per ID, doxycycline for another week.  - Cortisol level within normal limits. Pro-calcitonin 6.87, CRP 8.1, lactic acid 2.02 - Blood cultures negative to date - CT scan showed 3.3 cm pseudoaneurysm-distal  right superficial femoral artery/acute DVT extending into the anterior and posterior tibial veins - Carotid artery duplex showed1-39 percent stenosis involving the right internal carotid artery and the left internal carotid artery - Cleared by cardiology for right femoral popliteal aneurysm repair.    Right popliteal artery aneurysm with DVT - Currently on a heparin drip as recommended by vascular surgery. Vascular surgery, recommended she can start NOAC for anticoagulation - Case management consult placed for insurance co-pays for xarelto vs eliquis . She will be discharged on xarelto.   - per vasc surgery, once acute right leg issues are resolved then surgery will be considered at outpatient follow-up  Dehydration/AKI (acute kidney injury)  - Creatinine stable, improved from 1.22 to 0.73 .  - no new labs today.   Seizure disorder secondary to prior SAH -Continue preadmission Keppra -Seizure precautions - no new seizures  COPD (chronic obstructive pulmonary disease) -Currently stable without wheezing  -Continue oral budesonide  Dementia/depression -Daughter assists with patient's ADLs/IADLs at home -Continue preadmission Namenda and Celexa  Anemia -Baseline hemoglobin 9-10 - Hemoglobin currently at baseline   Dyslipidemia -Not on medications prior to admission  GERD (gastroesophageal reflux disease -stable.   Severe protein-calorie malnutrition  Megace discontinued due to increased risk of DVTs -Nutrition consultation   Urinary tract infection:  Urine cultures grew pseudomonas and E coli. Discussed with Dr Dorina Hoyer, recommended treating her for pseudomonas as she has h/o of ureteral stent.  Will discharge her on one week of ciprofloxacin.    Discharge Instructions  Discharge Instructions    Call MD for:  difficulty breathing, headache or visual disturbances    Complete by:  As directed    Call MD for:  extreme fatigue    Complete by:  As directed    Call MD for:  persistant dizziness or light-headedness    Complete by:  As directed    Call MD for:  persistant nausea and vomiting    Complete by:  As directed    Call MD for:  redness, tenderness, or signs of infection (pain, swelling, redness, odor or green/yellow discharge around incision site)    Complete by:  As directed    Call MD for:  severe uncontrolled pain    Complete by:  As directed    Discharge instructions    Complete by:  As directed    Please follow up with vascular surgery in one week.  Please follow up with PCP in one week.     Allergies as of 12/21/2015      Reactions   Penicillins Anaphylaxis      Medication List    STOP taking these medications   megestrol 40 MG tablet Commonly known as:  MEGACE   sulfamethoxazole-trimethoprim 800-160 MG tablet Commonly known as:  BACTRIM DS,SEPTRA DS     TAKE these medications   albuterol 108 (90 Base) MCG/ACT inhaler Commonly known as:  PROVENTIL HFA;VENTOLIN HFA Inhale 2 puffs into the lungs every 6 (six) hours as needed for wheezing or shortness of breath.   atorvastatin 80 MG tablet Commonly known as:  LIPITOR Take 1 tablet (80 mg total) by mouth daily at 6 PM. What changed:  how much to take   budesonide 3 MG 24 hr capsule Commonly known as:  ENTOCORT EC Take 6 mg by mouth every morning.   budesonide-formoterol 160-4.5 MCG/ACT inhaler Commonly known as:  SYMBICORT Inhale 2 puffs into the lungs 2 (two) times daily.   CEREFOLIN NAC 6-90.314-2-600 MG Tabs Take 1 tablet by mouth daily.   ciprofloxacin 500 MG tablet Commonly known as:  CIPRO Take 1 tablet (500 mg total) by mouth 2 (two) times daily.   citalopram 10 MG tablet Commonly known as:  CELEXA TAKE 1 TABLET (10 MG TOTAL) BY MOUTH DAILY.   doxycycline 100 MG tablet Commonly known as:  VIBRA-TABS Take 1 tablet (100 mg total) by mouth every 12 (twelve) hours.   feeding supplement (ENSURE ENLIVE) Liqd Take 237 mLs by mouth 2  (two) times daily between meals.   feeding supplement (PRO-STAT SUGAR FREE 64) Liqd Take 30 mLs by mouth 2 (two) times daily.   ferrous sulfate 325 (65 FE) MG EC tablet Take 325 mg by mouth daily with breakfast.   levETIRAcetam 250 MG tablet Commonly known as:  KEPPRA Take 1 tablet (250 mg total) by mouth 2 (two) times daily.   magnesium oxide 400 (241.3 Mg) MG tablet Commonly known as:  MAG-OX Take 1 tablet (400 mg total) by mouth 2 (two) times daily.   memantine 5 MG tablet Commonly known as:  NAMENDA SIG:  Take one tablet daily for one week  then: take one tablet in AM and one tablet in PM for one week.  then: take one tablet in AM and two tablets in PM for one week.  then: take two tablets in AM and two tablets in PM.   pantoprazole 40 MG tablet Commonly known as:  PROTONIX TAKE ONE TABLET ONCE DAILY WITH BREAKFAST   POTASSIUM PO Take 1 capsule by mouth daily. OTC   Rivaroxaban 15 MG Tabs tablet Commonly known as:  XARELTO Take 1 tablet (15 mg total) by mouth 2 (two) times daily with a meal.   rivaroxaban 20 MG Tabs tablet Commonly  known as:  XARELTO Take 1 tablet (20 mg total) by mouth daily with supper. Start taking on:  01/12/2016   solifenacin 5 MG tablet Commonly known as:  VESICARE Take 5 mg by mouth daily.   SPIRIVA HANDIHALER 18 MCG inhalation capsule Generic drug:  tiotropium Place 1 capsule into inhaler and inhale daily.      Follow-up Information    [name], MD. Schedule an appointment as soon as possible for a visit in 1 week(s).   Specialty:  Internal Medicine Contact information: [place] [address] [phone]        [name], MD. Schedule an appointment as soon as possible for a visit in 1 week(s).   Specialties:  Vascular Surgery, Cardiology Why:  please follow up with vascular surgery in 1 to 2 weeks.  Contact information: [address] [place] [address] [phone]          Allergies  Allergen  Reactions  . Penicillins Anaphylaxis    Consultations:  Cardiology  Vascular surgery  Spoke to Dr Dorina Hoyer urology over the phone.    Procedures/Studies: Ct Femur Right W Contrast  Addendum Date: 12/13/2015   ADDENDUM REPORT: 12/13/2015 17:58 ADDENDUM: The original report was by Dr. Van Clines. The following addendum is by Dr. Van Clines: These results were called by telephone at the time of interpretation on 12/13/2015 at 5:56 pm to Dr. Doren Custard HOBBS, who verbally acknowledged these results. Electronically Signed   By: Van Clines M.D.   On: 12/13/2015 17:58   Result Date: 12/13/2015 CLINICAL DATA:  Right leg cellulitis, worsening.  Arthritis. EXAM: CT OF THE RIGHT FEMUR WITHOUT CONTRAST CT OF THE RIGHT TIBIA/ FIBULA WITHOUT CONTRAST TECHNIQUE: Multidetector CT imaging of the lower right extremity was performed according to the standard protocol following intravenous contrast administration. COMPARISON:  None. CONTRAST:  93mL ISOVUE-300 IOPAMIDOL (ISOVUE-300) INJECTION 61% FINDINGS: RIGHT FEMUR: No findings of active osteomyelitis. Moderate osteoarthritis of the knee with tricompartmental spurring and a trace knee effusion. There is a left ureteral stent extending into the urinary bladder. Small left inguinal lymph nodes are present. Atherosclerotic calcification in the common femoral artery and superficial femoral artery ; there is a saccular aneurysm of the SFA centered about 12 cm above the joint line of the knee, mild surrounding stranding, the aneurysm measures 3.3 by 2.8 by 2.7 cm and is partially thrombosed. There is partially occlusive deep vein thrombosis in the right popliteal vein extending down into the tibial veins. Subcutaneous edema laterally in the distal thigh. No abscess identified. RIGHT TIBIA/FIBULA: No findings of osteomyelitis. Subcutaneous edema fairly circumferentially in the calf and extending down to the ankle where it is again circumferential. This  could reflect cellulitis but is nonspecific and there is no abscess or gas tracking in the soft tissues. No abnormal effacement of fat planes along the calf musculature. Aside from the clot in the popliteal vein, there is branching clot extending in the proximal aspects of the anterior tibial vein and posterior tibial vein. Atherosclerotic calcification of arterial structures noted, patent to the arterial structures is difficult to assess as today' s exam was not performed as an angiogram type of study, correlation with distal pulses recommended. IMPRESSION: 1. 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 At the upper margin of this aneurysm there is some narrowing of the artery. Urgent vascular surgical consultation recommended for further workup. 2. Likely acute popliteal vein partially occlusive DVT  extending into the anterior and posterior tibial veins. This was not shown on the prior ultrasound exam from 11/25/2015. 3. Circumferential subcutaneous edema in the calf and ankle, along with some edema in the subcutaneous tissues of the lateral thigh. Although cellulitis could possibly give this appearance, the appearance could also be related to the DVT, venous stasis, or other conditions. No abscess or findings suggesting myositis/fasciitis. 4. Moderate osteoarthritis of the knee with small knee effusion. Radiology assistant personnel have been notified to put me in telephone contact with the referring physician or the referring physician's clinical representative in order to discuss these findings. Once this communication is established I will issue an addendum to this report for documentation purposes. Electronically Signed: By: Van Clines M.D. On: 12/13/2015 17:25   Ct Tibia Fibula Right W Contrast  Addendum Date: 12/13/2015   ADDENDUM REPORT: 12/13/2015 17:58 ADDENDUM: The original report was by Dr. Van Clines.  The following addendum is by Dr. Van Clines: These results were called by telephone at the time of interpretation on 12/13/2015 at 5:56 pm to Dr. Doren Custard HOBBS, who verbally acknowledged these results. Electronically Signed   By: Van Clines M.D.   On: 12/13/2015 17:58   Result Date: 12/13/2015 CLINICAL DATA:  Right leg cellulitis, worsening.  Arthritis. EXAM: CT OF THE RIGHT FEMUR WITHOUT CONTRAST CT OF THE RIGHT TIBIA/ FIBULA WITHOUT CONTRAST TECHNIQUE: Multidetector CT imaging of the lower right extremity was performed according to the standard protocol following intravenous contrast administration. COMPARISON:  None. CONTRAST:  59mL ISOVUE-300 IOPAMIDOL (ISOVUE-300) INJECTION 61% FINDINGS: RIGHT FEMUR: No findings of active osteomyelitis. Moderate osteoarthritis of the knee with tricompartmental spurring and a trace knee effusion. There is a left ureteral stent extending into the urinary bladder. Small left inguinal lymph nodes are present. Atherosclerotic calcification in the common femoral artery and superficial femoral artery ; there is a saccular aneurysm of the SFA centered about 12 cm above the joint line of the knee, mild surrounding stranding, the aneurysm measures 3.3 by 2.8 by 2.7 cm and is partially thrombosed. There is partially occlusive deep vein thrombosis in the right popliteal vein extending down into the tibial veins. Subcutaneous edema laterally in the distal thigh. No abscess identified. RIGHT TIBIA/FIBULA: No findings of osteomyelitis. Subcutaneous edema fairly circumferentially in the calf and extending down to the ankle where it is again circumferential. This could reflect cellulitis but is nonspecific and there is no abscess or gas tracking in the soft tissues. No abnormal effacement of fat planes along the calf musculature. Aside from the clot in the popliteal vein, there is branching clot extending in the proximal aspects of the anterior tibial vein and posterior tibial  vein. Atherosclerotic calcification of arterial structures noted, patent to the arterial structures is difficult to assess as today' s exam was not performed as an angiogram type of study, correlation with distal pulses recommended. IMPRESSION: 1. 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 At the upper margin of this aneurysm there is some narrowing of the artery. Urgent vascular surgical consultation recommended for further workup. 2. Likely acute popliteal vein partially occlusive DVT extending into the anterior and posterior tibial veins. This was not shown on the prior ultrasound exam from 11/25/2015. 3. Circumferential subcutaneous edema in the calf and ankle, along with some edema in the subcutaneous tissues of the lateral thigh. Although cellulitis could possibly give this appearance, the appearance could also be related to the DVT, venous  stasis, or other conditions. No abscess or findings suggesting myositis/fasciitis. 4. Moderate osteoarthritis of the knee with small knee effusion. Radiology assistant personnel have been notified to put me in telephone contact with the referring physician or the referring physician's clinical representative in order to discuss these findings. Once this communication is established I will issue an addendum to this report for documentation purposes. Electronically Signed: By: Van Clines M.D. On: 12/13/2015 17:25    Subjective:  No new complaints.  Discharge Exam: Vitals:   12/21/15 0357 12/21/15 1432  BP: 127/60 112/64  Pulse: 78 97  Resp: 16 16  Temp: 98.4 F (36.9 C) 97.8 F (36.6 C)   Vitals:   12/21/15 0357 12/21/15 0555 12/21/15 1345 12/21/15 1432  BP: 127/60   112/64  Pulse: 78   97  Resp: 16   16  Temp: 98.4 F (36.9 C)   97.8 F (36.6 C)  TempSrc: Oral   Oral  SpO2: 97%  98% 95%  Weight:  44.5 kg (98 lb 1.7 oz)    Height:        General: Pt is alert,  awake, not in acute distress Cardiovascular: RRR, S1/S2 +, no rubs, no gallops Respiratory: CTA bilaterally, no wheezing, no rhonchi Abdominal: Soft, NT, ND, bowel sounds + Extremities: no edema, no cyanosis    The results of significant diagnostics from this hospitalization (including imaging, microbiology, ancillary and laboratory) are listed below for reference.     Microbiology: Recent Results (from the past 240 hour(s))  Culture, Urine     Status: Abnormal   Collection Time: 12/18/15  8:12 AM  Result Value Ref Range Status   Specimen Description URINE, RANDOM  Final   Special Requests NONE  Final   Culture (A)  Final    >=100,000 COLONIES/mL ESCHERICHIA COLI >=100,000 COLONIES/mL PSEUDOMONAS AERUGINOSA    Report Status 12/20/2015 FINAL  Final   Organism ID, Bacteria ESCHERICHIA COLI (A)  Final   Organism ID, Bacteria PSEUDOMONAS AERUGINOSA (A)  Final      Susceptibility   Escherichia coli - MIC*    AMPICILLIN >=32 RESISTANT Resistant     CEFAZOLIN <=4 SENSITIVE Sensitive     CEFTRIAXONE <=1 SENSITIVE Sensitive     CIPROFLOXACIN >=4 RESISTANT Resistant     GENTAMICIN <=1 SENSITIVE Sensitive     IMIPENEM <=0.25 SENSITIVE Sensitive     NITROFURANTOIN <=16 SENSITIVE Sensitive     TRIMETH/SULFA <=20 SENSITIVE Sensitive     AMPICILLIN/SULBACTAM 16 INTERMEDIATE Intermediate     PIP/TAZO <=4 SENSITIVE Sensitive     Extended ESBL NEGATIVE Sensitive     * >=100,000 COLONIES/mL ESCHERICHIA COLI   Pseudomonas aeruginosa - MIC*    CEFTAZIDIME 4 SENSITIVE Sensitive     CIPROFLOXACIN 0.5 SENSITIVE Sensitive     GENTAMICIN <=1 SENSITIVE Sensitive     IMIPENEM 1 SENSITIVE Sensitive     PIP/TAZO 8 SENSITIVE Sensitive     CEFEPIME 4 SENSITIVE Sensitive     * >=100,000 COLONIES/mL PSEUDOMONAS AERUGINOSA     Labs: BNP (last 3 results)  Recent Labs  11/08/15 1331  BNP 0000000   Basic Metabolic Panel: No results for input(s): NA, K, CL, CO2, GLUCOSE, BUN, CREATININE, CALCIUM, MG,  PHOS in the last 168 hours. Liver Function Tests: No results for input(s): AST, ALT, ALKPHOS, BILITOT, PROT, ALBUMIN in the last 168 hours. No results for input(s): LIPASE, AMYLASE in the last 168 hours. No results for input(s): AMMONIA in the last 168 hours. CBC:  Recent Labs  Lab 12/20/15 0500 12/21/15 0445  WBC 7.8 7.9  HGB 10.6* 10.9*  HCT 32.7* 33.5*  MCV 97.0 98.2  PLT 337 343   Cardiac Enzymes: No results for input(s): CKTOTAL, CKMB, CKMBINDEX, TROPONINI in the last 168 hours. BNP: Invalid input(s): POCBNP CBG: No results for input(s): GLUCAP in the last 168 hours. D-Dimer No results for input(s): DDIMER in the last 72 hours. Hgb A1c No results for input(s): HGBA1C in the last 72 hours. Lipid Profile No results for input(s): CHOL, HDL, LDLCALC, TRIG, CHOLHDL, LDLDIRECT in the last 72 hours. Thyroid function studies No results for input(s): TSH, T4TOTAL, T3FREE, THYROIDAB in the last 72 hours.  Invalid input(s): FREET3 Anemia work up No results for input(s): VITAMINB12, FOLATE, FERRITIN, TIBC, IRON, RETICCTPCT in the last 72 hours. Urinalysis    Component Value Date/Time   COLORURINE STRAW (A) 12/18/2015 0812   APPEARANCEUR CLEAR 12/18/2015 0812   LABSPEC 1.003 (L) 12/18/2015 0812   PHURINE 8.0 12/18/2015 0812   GLUCOSEU NEGATIVE 12/18/2015 0812   HGBUR NEGATIVE 12/18/2015 0812   BILIRUBINUR NEGATIVE 12/18/2015 0812   KETONESUR NEGATIVE 12/18/2015 0812   PROTEINUR NEGATIVE 12/18/2015 0812   UROBILINOGEN 0.2 07/26/2013 2234   NITRITE NEGATIVE 12/18/2015 0812   LEUKOCYTESUR TRACE (A) 12/18/2015 0812   Sepsis Labs Invalid input(s): PROCALCITONIN,  WBC,  LACTICIDVEN Microbiology Recent Results (from the past 240 hour(s))  Culture, Urine     Status: Abnormal   Collection Time: 12/18/15  8:12 AM  Result Value Ref Range Status   Specimen Description URINE, RANDOM  Final   Special Requests NONE  Final   Culture (A)  Final    >=100,000 COLONIES/mL ESCHERICHIA  COLI >=100,000 COLONIES/mL PSEUDOMONAS AERUGINOSA    Report Status 12/20/2015 FINAL  Final   Organism ID, Bacteria ESCHERICHIA COLI (A)  Final   Organism ID, Bacteria PSEUDOMONAS AERUGINOSA (A)  Final      Susceptibility   Escherichia coli - MIC*    AMPICILLIN >=32 RESISTANT Resistant     CEFAZOLIN <=4 SENSITIVE Sensitive     CEFTRIAXONE <=1 SENSITIVE Sensitive     CIPROFLOXACIN >=4 RESISTANT Resistant     GENTAMICIN <=1 SENSITIVE Sensitive     IMIPENEM <=0.25 SENSITIVE Sensitive     NITROFURANTOIN <=16 SENSITIVE Sensitive     TRIMETH/SULFA <=20 SENSITIVE Sensitive     AMPICILLIN/SULBACTAM 16 INTERMEDIATE Intermediate     PIP/TAZO <=4 SENSITIVE Sensitive     Extended ESBL NEGATIVE Sensitive     * >=100,000 COLONIES/mL ESCHERICHIA COLI   Pseudomonas aeruginosa - MIC*    CEFTAZIDIME 4 SENSITIVE Sensitive     CIPROFLOXACIN 0.5 SENSITIVE Sensitive     GENTAMICIN <=1 SENSITIVE Sensitive     IMIPENEM 1 SENSITIVE Sensitive     PIP/TAZO 8 SENSITIVE Sensitive     CEFEPIME 4 SENSITIVE Sensitive     * >=100,000 COLONIES/mL PSEUDOMONAS AERUGINOSA     Time coordinating discharge: Over 30 minutes  SIGNED:   Hosie Poisson, MD  [facility] 12/26/2015, 11:19 AM Pager   If 7PM-7AM, please contact night-coverage [email or web address] Password [email or web address]

## 2015-12-26 NOTE — Patient Outreach (Signed)
[place] [facility]) Care Management  12/26/2015  Bridget Ball [birth year] [id number]   I reached out to the home and both daughters of Bridget Ball today to follow up and provide transition of care services to her after her recent discharge from a readmission for recurrent cellulitis of the lower extremities. I was unable to reach anyone but left a HIPPA compliant voice message requesting a return call and have also reached out to Dr. Juel Burrow office to inquire about her disposition.   Plan: I will continue to outreach to Bridget Ball in an effort to inquire about her willingness to continue participation in [place] Management services and to provide transition of care support.    [place] Management  [phone]

## 2015-12-27 ENCOUNTER — Encounter: Payer: Self-pay | Admitting: Surgery

## 2015-12-27 DIAGNOSIS — L03116 Cellulitis of left lower limb: Secondary | ICD-10-CM | POA: Diagnosis not present

## 2016-01-03 ENCOUNTER — Other Ambulatory Visit: Payer: Self-pay | Admitting: *Deleted

## 2016-01-03 NOTE — Patient Outreach (Signed)
[place] [facility]) Care Management  01/03/2016  Bridget Ball 12-27-1928 [id number]  Last week, I reached out to the home and both daughters of Bridget Ball and was unable to reach anyone but left a HIPPA compliant voice message requesting a return call and also reached out to Dr. Juel Burrow office to inquire about her disposition. I was unable to reach Bridget Ball by phone today as well.  Plan: I will continue to outreach to Bridget Ball in an effort to inquire about her willingness to continue participation in [place] Management services and to provide transition of care support. If I do not reach her next week, I will notify Dr. Juel Burrow office and will send a letter to her home offering our support if she wishes to engage.    [place] Management  [phone]

## 2016-01-07 ENCOUNTER — Other Ambulatory Visit: Payer: Self-pay | Admitting: *Deleted

## 2016-01-07 NOTE — Patient Outreach (Signed)
[place] [facility]) Care Management  01/07/2016  Bridget Ball 06/27/1928 [id number]  Bridget Ball a 83 y.o.female with past medical history of COPD, dementia, GERD, CVA, subdural bleed due to fall complicated with seizures, HLD and was admitted to the hospital in November withrecurrent cellulitis of the LE.   Bridget Ball was referred to [place] [facility] for transition of care assessments. I spoke with Bridget Ball daughter Bridget Ball who has been providing care since Bridget Ball's discharge from the hospital on 11/23. We last spoke on 11/27 when Bridget Ball said she Bridget Ball had shown drastic improvement in symptoms related to Bridget lower extremity cellulitis.   Bridget Ball was readmitted to the hospital on 12/13/15 with sepsis. I have tried to make contact with Bridget Ball and/or Bridget Ball by phone 5 times, including today, since 12/17/15 but have been unsuccessful. I reached out to Bridget provider office to inquire about Bridget Ball's disposition as well but have been unable to make contact with Bridget Ball or Bridget Ball.   Plan: I will send a letter to Bridget Ball and Bridget Ball, offering our continued support should she be interested in case management services. If I do not hear from Bridget Ball or Bridget Ball in 10 days, we will close Bridget case but happily reopen at any time in the future should she wish to engage in case management services.    [place] Management  [phone]

## 2016-01-08 ENCOUNTER — Encounter: Payer: Self-pay | Admitting: *Deleted

## 2016-01-13 ENCOUNTER — Ambulatory Visit: Payer: Medicare Other | Admitting: Surgery

## 2016-01-13 ENCOUNTER — Telehealth: Payer: Self-pay | Admitting: *Deleted

## 2016-01-13 NOTE — Telephone Encounter (Signed)
Patient's daughter, Bridget Ball, called to ask that Bridget Ball be prescribed a cheaper alternative to Xarelto. Patient was scheduled to come in to see Dr. Trula Slade today but we had to close the office d/t frozen water pipes. Bridget Ball was rescheduled to 01-20-16 to discuss popliteal aneurysm surgery. Per Dr. Trula Slade, the patient will need to be on anticoagulant therapy for at least 3 months. She may be switched to Coumadin but this will need to come from her PCP, Dr. [name] d/t the monitoring envolved. Bridget voiced understanding of this plan and will contact Dr. Juel Burrow office to get Rx and arrange blood work monitoring by his office.

## 2016-01-14 ENCOUNTER — Other Ambulatory Visit: Payer: Self-pay | Admitting: Neurology

## 2016-01-16 ENCOUNTER — Encounter: Payer: Self-pay | Admitting: Surgery

## 2016-01-16 DIAGNOSIS — J449 Chronic obstructive pulmonary disease, unspecified: Secondary | ICD-10-CM | POA: Diagnosis not present

## 2016-01-16 DIAGNOSIS — I679 Cerebrovascular disease, unspecified: Secondary | ICD-10-CM | POA: Diagnosis not present

## 2016-01-17 ENCOUNTER — Other Ambulatory Visit: Payer: Self-pay | Admitting: Urology

## 2016-01-17 DIAGNOSIS — N131 Hydronephrosis with ureteral stricture, not elsewhere classified: Secondary | ICD-10-CM

## 2016-01-20 ENCOUNTER — Other Ambulatory Visit: Payer: Self-pay | Admitting: *Deleted

## 2016-01-20 ENCOUNTER — Other Ambulatory Visit: Payer: Self-pay | Admitting: Adult Health

## 2016-01-20 ENCOUNTER — Ambulatory Visit (INDEPENDENT_AMBULATORY_CARE_PROVIDER_SITE_OTHER): Payer: Medicare Other | Admitting: Surgery

## 2016-01-20 ENCOUNTER — Encounter: Payer: Self-pay | Admitting: Surgery

## 2016-01-20 ENCOUNTER — Encounter: Payer: Self-pay | Admitting: *Deleted

## 2016-01-20 VITALS — BP 100/65 | HR 82 | Temp 99.1°F | Resp 18 | Ht 61.0 in | Wt 90.8 lb

## 2016-01-20 DIAGNOSIS — I724 Aneurysm of artery of lower extremity: Secondary | ICD-10-CM

## 2016-01-20 NOTE — Progress Notes (Signed)
[facility]in [facility]ist [facility]  Patient name: Bridget Ball MRN: [id number] DOB: April 20, 1928 Sex: female  REASON FOR VISIT: follow up  HPI: Bridget Ball is a 83 y.o. female returns today for follow-up of her right popliteal artery aneurysm.  She initially presented in transfer her to [facility] from [facility] with recurrent cellulitis of her right leg.  There was concern for possible necrotizing fasciitis and therefore CT scan was performed.  The CT scan revealed a large right popliteal aneurysm compressing the femoral vein which was likely the source of her DVT.  The discoloration in her leg improved with IV antibiotics as well as heparin.  She was ultimately able to be discharged to home with Bridget Ball.  While in the hospital she did receive cardiology clearance to proceed with surgery.  I also performed vein mapping which showed an adequate saphenous vein.  Past Medical History:  Diagnosis Date  . Anxiety   . Arthritis   . Attention to nephrostomy ([place])    PT HAS NEPHROSTOMY TUBE IN PLACE  . Bruises easily   . Cancer [facility])    Skin cancer  . Chronic diarrhea   . Colitis   . COPD (chronic obstructive pulmonary disease) ([place])   . Dementia   . Depression   . GERD (gastroesophageal reflux disease)   . Hiatal hernia   . Hx of bronchitis   . Hx of pulmonary edema JULY 2015  . Hx of pulmonary edema JULY 2015  . Hx of septic shock JULY 2015  . Hydronephrosis, left   . Hyperlipidemia   . Memory difficulties   . Microscopic colitis   . Myocardial infarction    unknown time   . Renal insufficiency   . Seizures ([place])    last seizure was 4-5 years ago with "brain bleed"  . Shortness of breath    with exertion  . Stroke [facility])    TIA four years ago / stroke NOV 2014    Family History  Problem Relation Age of Onset  . Diabetes Mother   . Stroke Mother   . Heart attack Father     SOCIAL HISTORY: Social History  Substance Use Topics    . Smoking status: Former Smoker    Packs/day: 1.00    Years: 50.00    Types: Cigarettes    Quit date: 11/13/1997  . Smokeless tobacco: Never Used  . Alcohol use 4.2 oz/week    7 Glasses of wine per week     Comment: 2 glasses a night wine (none in a year)      Current Outpatient Prescriptions  Medication Sig Dispense Refill  . albuterol (PROVENTIL HFA;VENTOLIN HFA) 108 (90 BASE) MCG/ACT inhaler Inhale 2 puffs into the lungs every 6 (six) hours as needed for wheezing or shortness of breath. 1 Inhaler 0  . atorvastatin (LIPITOR) 80 MG tablet Take 1 tablet (80 mg total) by mouth daily at 6 PM. (Patient taking differently: Take 40 mg by mouth daily at 6 PM. ) 30 tablet 0  . budesonide (ENTOCORT EC) 3 MG 24 hr capsule Take 6 mg by mouth every morning.     . budesonide-formoterol (SYMBICORT) 160-4.5 MCG/ACT inhaler Inhale 2 puffs into the lungs 2 (two) times daily. 1 Inhaler 0  . citalopram (CELEXA) 10 MG tablet TAKE 1 TABLET BY MOUTH DAILY 90 tablet 0  . feeding supplement, ENSURE ENLIVE, (ENSURE ENLIVE) LIQD Take 237 mLs by mouth 2 (two) times daily between meals. 237 mL 12  .  levETIRAcetam (KEPPRA) 250 MG tablet Take 1 tablet (250 mg total) by mouth 2 (two) times daily. 60 tablet 11  . memantine (NAMENDA) 5 MG tablet SIG:  Take one tablet daily for one week  then: take one tablet in AM and one tablet in PM for one week.  then: take one tablet in AM and two tablets in PM for one week.  then: take two tablets in AM and two tablets in PM. 120 tablet 0  . Methylfol-Algae-B12-Acetylcyst (CEREFOLIN NAC) 6-90.314-2-600 MG TABS Take 1 tablet by mouth daily. 90 tablet 3  . pantoprazole (PROTONIX) 40 MG tablet TAKE ONE TABLET ONCE DAILY WITH BREAKFAST 30 tablet 5  . POTASSIUM PO Take 1 capsule by mouth daily. OTC    . solifenacin (VESICARE) 5 MG tablet Take 5 mg by mouth daily.    Marland Kitchen SPIRIVA HANDIHALER 18 MCG inhalation capsule Place 1 capsule into inhaler and inhale daily.  1  . Amino  Acids-Protein Hydrolys (FEEDING SUPPLEMENT, PRO-STAT SUGAR FREE 64,) LIQD Take 30 mLs by mouth 2 (two) times daily. (Patient not taking: Reported on 01/20/2016) 900 mL 0  . ciprofloxacin (CIPRO) 500 MG tablet Take 1 tablet (500 mg total) by mouth 2 (two) times daily. (Patient not taking: Reported on 01/20/2016) 14 tablet 0  . doxycycline (VIBRA-TABS) 100 MG tablet Take 1 tablet (100 mg total) by mouth every 12 (twelve) hours. (Patient not taking: Reported on 01/20/2016) 14 tablet 0  . ferrous sulfate 325 (65 FE) MG EC tablet Take 325 mg by mouth daily with breakfast.    . magnesium oxide (MAG-OX) 400 (241.3 Mg) MG tablet Take 1 tablet (400 mg total) by mouth 2 (two) times daily. (Patient not taking: Reported on 01/20/2016) 30 tablet 0  . Rivaroxaban (XARELTO) 15 MG TABS tablet Take 1 tablet (15 mg total) by mouth 2 (two) times daily with a meal. 42 tablet 0  . rivaroxaban (XARELTO) 20 MG TABS tablet Take 1 tablet (20 mg total) by mouth daily with supper. (Patient not taking: Reported on 01/20/2016) 30 tablet 0   No current facility-administered medications for this visit.     REVIEW OF SYSTEMS:  [X]  denotes positive finding, [ ]  denotes negative finding Cardiac  Comments:  Chest pain or chest pressure:    Shortness of breath upon exertion: x   Short of breath when lying flat:    Irregular heart rhythm:        Vascular    Pain in calf, thigh, or hip brought on by ambulation:    Pain in feet at night that wakes you up from your sleep:     Blood clot in your veins: x   Leg swelling:  x       Pulmonary    Oxygen at home:    Productive cough:     Wheezing:  x       Neurologic    Sudden weakness in arms or legs:     Sudden numbness in arms or legs:     Sudden onset of difficulty speaking or slurred speech:    Temporary loss of vision in one eye:     Problems with dizziness:         Gastrointestinal    Blood in stool:     Vomited blood:         Genitourinary    Burning when urinating:      Blood in urine:        Psychiatric    Major depression:  Hematologic    Bleeding problems:    Problems with blood clotting too easily:        Skin    Rashes or ulcers:        Constitutional    Fever or chills:      PHYSICAL EXAM: Vitals:   01/20/16 1616  BP: 100/65  Pulse: 82  Resp: 18  Temp: 99.1 F (37.3 C)  TempSrc: Oral  SpO2: 96%  Weight: 90 lb 12.8 oz (41.2 kg)  Height: 5\' 1"  (1.549 m)    GENERAL: The patient is a well-nourished female, in no acute distress. The vital signs are documented above. CARDIAC: There is a regular rate and rhythm.  VASCULAR: Significant improvement in the discoloration of the right leg with minimal edema PULMONARY: Non-labored respirations ABDOMEN: Soft and non-tender with normal pitched bowel sounds.  MUSCULOSKELETAL: There are no major deformities or cyanosis. NEUROLOGIC: No focal weakness or paresthesias are detected. SKIN: There are no ulcers or rashes noted. PSYCHIATRIC: The patient has a normal affect.  DATA:  None  MEDICAL ISSUES: Right popliteal artery aneurysm: I discussed proceeding with open repair of her popliteal aneurysm as I do not think endovascular repair will resolve the compression into the femoral vein.  I will harvest the saphenous vein from the left leg given the DVT in the right leg.  My plan is to isolate the aneurysm and do an interposition graft, however I did discuss that she may require a femoral-popliteal bypass graft, depending on the quality of the artery proximal and distal to the aneurysm.  I contemplated placing an IVC filter preoperatively, however she has been on anticoagulation for at least 6 weeks and therefore I think the likelihood of mobilization of the DVT is low.  I will try to initiate heparin therapy shortly after starting the operation.  This has been scheduled for late January/early February.  She will stop her Bridget Ball 2 days before operation   [name], MD Vascular and Vein  Special[facility] of [facility] [phone] Pager [phone]

## 2016-01-20 NOTE — Patient Outreach (Signed)
[facility]) Care Management  01/20/2016  Bridget Ball Apr 16, 1928 [id number]  [name] a 83 y.o.female with past medical history of COPD, dementia, GERD, CVA, subdural bleed due to fall complicated with seizures, HLD and was admitted to the hospital in November withrecurrent cellulitis of the LE.   [name] was referred to [facility] for transition of care assessments. I spoke withMrs. Ball'sdaughter Bridget Ball who had been providing care since Bridget Ball's discharge from the hospital on 11/23. We last spoke on 11/27 when Bridget Ball said she Bridget Ball had shown drastic improvement in symptoms related to her lower extremity cellulitis.   Bridget Ball was readmitted to the hospital on 12/13/15 with sepsis. I have tried to make contact with Bridget Ball and/or her daughters by phone 5 times, including today, since 12/17/15 but have been unsuccessful. I reached out to her provider office to inquire about Bridget Ball's disposition as well but have been unable to make contact with Bridget Ball or her daughters.   On 01/07/16,  I sent a letter to Bridget Ball and her daughters, offering our continued support should she be interested in case management services. I have not received any communication from Bridget Ball or her daughters in 10 days. Therefore, I will close her case but happily reopen at any time in the future should she wish to engage in case management services.   Plan: Case Closure. Letter sent to patient and primary care provider.    [facility]  [phone]

## 2016-01-24 ENCOUNTER — Telehealth: Payer: Self-pay | Admitting: Adult Health

## 2016-01-24 MED ORDER — MEMANTINE HCL 10 MG PO TABS
10.0000 mg | ORAL_TABLET | Freq: Two times a day (BID) | ORAL | 11 refills | Status: DC
Start: 2016-01-24 — End: 2016-03-31

## 2016-01-24 NOTE — Telephone Encounter (Signed)
New prescription sent

## 2016-01-24 NOTE — Addendum Note (Signed)
Addended by: Trudie Buckler on: 01/24/2016 12:06 PM   Modules accepted: Orders

## 2016-01-24 NOTE — Telephone Encounter (Signed)
Tammy with [facility] called in reference to memantine (NAMENDA) 5 MG tablet.  Patients insurance will not cover the 5mg  , but will cover the 10mg .  Tammy was wanting to know if this is able to be switched.  Please call

## 2016-01-30 ENCOUNTER — Encounter (HOSPITAL_COMMUNITY)
Admission: RE | Admit: 2016-01-30 | Discharge: 2016-01-30 | Disposition: A | Discharge status: Home / Self Care | Payer: Medicare Other | Source: Ambulatory Visit | Attending: Surgery | Admitting: Surgery

## 2016-01-30 ENCOUNTER — Encounter (HOSPITAL_COMMUNITY): Payer: Self-pay

## 2016-01-30 DIAGNOSIS — Z01812 Encounter for preprocedural laboratory examination: Secondary | ICD-10-CM | POA: Insufficient documentation

## 2016-01-30 DIAGNOSIS — I10 Essential (primary) hypertension: Secondary | ICD-10-CM | POA: Diagnosis not present

## 2016-01-30 DIAGNOSIS — Z01810 Encounter for preprocedural cardiovascular examination: Secondary | ICD-10-CM | POA: Diagnosis not present

## 2016-01-30 LAB — CBC
HCT: 36.3 % (ref 36.0–46.0)
HEMOGLOBIN: 11.4 g/dL — AB (ref 12.0–15.0)
MCH: 31.1 pg (ref 26.0–34.0)
MCHC: 31.4 g/dL (ref 30.0–36.0)
MCV: 99.2 fL (ref 78.0–100.0)
Platelets: 235 10*3/uL (ref 150–400)
RBC: 3.66 MIL/uL — AB (ref 3.87–5.11)
RDW: 14.6 % (ref 11.5–15.5)
WBC: 5.8 10*3/uL (ref 4.0–10.5)

## 2016-01-30 LAB — TYPE AND SCREEN
ABO/RH(D): A POS
Antibody Screen: NEGATIVE

## 2016-01-30 LAB — URINALYSIS, ROUTINE W REFLEX MICROSCOPIC
Bilirubin Urine: NEGATIVE
GLUCOSE, UA: NEGATIVE mg/dL
KETONES UR: NEGATIVE mg/dL
Nitrite: NEGATIVE
PH: 5 (ref 5.0–8.0)
Protein, ur: NEGATIVE mg/dL
SPECIFIC GRAVITY, URINE: 1.014 (ref 1.005–1.030)

## 2016-01-30 LAB — COMPREHENSIVE METABOLIC PANEL
ALBUMIN: 3.8 g/dL (ref 3.5–5.0)
ALK PHOS: 52 U/L (ref 38–126)
ALT: 19 U/L (ref 14–54)
AST: 22 U/L (ref 15–41)
Anion gap: 10 (ref 5–15)
BUN: 6 mg/dL (ref 6–20)
CALCIUM: 9.3 mg/dL (ref 8.9–10.3)
CHLORIDE: 100 mmol/L — AB (ref 101–111)
CO2: 26 mmol/L (ref 22–32)
CREATININE: 0.88 mg/dL (ref 0.44–1.00)
GFR calc Af Amer: >60 mL/min (ref >60)
GFR calc non Af Amer: 58 mL/min — ABNORMAL LOW (ref >60)
GLUCOSE: 93 mg/dL (ref 65–99)
Potassium: 3.7 mmol/L (ref 3.5–5.1)
SODIUM: 136 mmol/L (ref 135–145)
Total Bilirubin: 0.4 mg/dL (ref 0.3–1.2)
Total Protein: 6.2 g/dL — ABNORMAL LOW (ref 6.5–8.1)

## 2016-01-30 LAB — PROTIME-INR
INR: 1.66
Prothrombin Time: 19.8 seconds — ABNORMAL HIGH (ref 11.4–15.2)

## 2016-01-30 LAB — ABO/RH: ABO/RH(D): A POS

## 2016-01-30 LAB — APTT: APTT: 41 s — AB (ref 24–36)

## 2016-01-30 LAB — SURGICAL PCR SCREEN
MRSA, PCR: NEGATIVE
STAPHYLOCOCCUS AUREUS: NEGATIVE

## 2016-01-30 MED ORDER — CHLORHEXIDINE GLUCONATE CLOTH 2 % EX PADS: 6.0000 | MEDICATED_PAD | Freq: Once | CUTANEOUS | Status: DC

## 2016-01-30 NOTE — Pre-Procedure Instructions (Signed)
    Bridget Ball  01/30/2016      [place] - [address] [address] [facility] [address] [place] [address] Phone: [phone] Fax: [phone]  [place], [place] [address] Phone: [phone] Fax: [phone]  [place], [address] [place] [address] Phone: [phone] Fax: [phone]    Your procedure is scheduled on 02/05/16.  [facility] Admitting at 530 A.M.  Call this number if you have problems the morning of surgery:  [phone]   Remember:  Do not eat food or drink liquids after midnight.  Take these medicines the morning of surgery with A SIP OF WATER --all inhalers,celexa,keppra,namenda,protonix   Do not wear jewelry, make-up or nail polish.  Do not wear lotions, powders, or perfumes, or deoderant.  Do not shave 48 hours prior to surgery.  Men may shave face and neck.  Do not bring valuables to the hospital.  [facility] is not responsible for any belongings or valuables.  Contacts, dentures or bridgework may not be worn into surgery.  Leave your suitcase in the car.  After surgery it may be brought to your room.  For patients admitted to the hospital, discharge time will be determined by your treatment team.  Patients discharged the day of surgery will not be allowed to drive home.   Name and phone number of your driver:   Special instructions: Do not take any aspirin,anti-inflammatories,vitamins,or herbal supplements 5-7 days prior to surgery.  Please read over the following fact sheets that you were given. MRSA Information

## 2016-02-03 NOTE — Progress Notes (Signed)
Anesthesia Chart Review:  Pt is an 83 year old female scheduled for L popliteal to popliteal bypass graft, L saphenous vein harvest on 02/05/2016 with Harold Barban, MD.   - PCP is Delphina Cahill, MD.   PMH includes:  MI, stroke, hyperlipidemia, renal insufficiency, COPD, seizures (secondary to prior subarachnoid hemorrhage 2014), dementia, GERD. Former smoker. BMI 17.5  - Pt hospitalized 12/8-12/16/17 for RLE cellulitis, complicated by AKI, malnutrition, dehydration, DVT of popliteal vein and R popliteal aneurysm. Pt saw Glenetta Hew, MD with cardiology for cardiac evaluation prior to vascular surgery and was cleared for surgery after echo (results below).   - Pt hospitalized 11/19-11/23/17 and 10/27-10/30/17 for RLE cellulitis  Medications include: albuterol, lipitor, entocort EC, symbicort, iron, keppra, memantine, protonix, potassium, xarelto, spriva. Pt to stop xarelto 02/01/16.   Preoperative labs reviewed.  PT 19.8, PTT 41. Will recheck DOS.   CXR 11/08/15: COPD. Bilateral pleural-parenchymal scarring. No acute abnormality.  EKG 01/30/16: Sinus tachycardia (104 bpm). Low voltage QRS.   Echo 12/16/15:  - Left ventricle: The cavity size was normal. Wall thickness was increased in a pattern of mild LVH. Systolic function was normal. The estimated ejection fraction was in the range of 55% to 60%. Doppler parameters are consistent with abnormal left ventricular   relaxation (grade 1 diastolic dysfunction). The E/e&' ratio is >15, suggesting elevated LV filling pressure. - Aortic valve: Sclerosis without stenosis. There was trivial regurgitation. - Mitral valve: Mildly thickened leaflets . There was trivial regurgitation. - Left atrium: The atrium was normal in size. - Right atrium: The atrium was normal in size. - Inferior vena cava: The vessel was dilated. The respirophasic diameter changes were blunted (< 50%), consistent with elevated central venous pressure. - Impressions: Compared to a  prior study in 2015, there have been no significant changes.  Carotid US 12/15/15: 1-39 percent stenosis B ICA  If labs acceptable DOS, I anticipate pt can proceed as scheduled.   Willeen Cass, FNP-BC [facility]/Anesthesiology Phone: [phone] 02/03/2016 2:27 PM

## 2016-02-04 ENCOUNTER — Encounter: Payer: Self-pay | Admitting: Internal Medicine

## 2016-02-05 ENCOUNTER — Encounter (HOSPITAL_COMMUNITY)
Admission: RE | Disposition: A | Discharge status: Home Health | Payer: Self-pay | Source: Ambulatory Visit | Attending: Surgery

## 2016-02-05 ENCOUNTER — Inpatient Hospital Stay (HOSPITAL_COMMUNITY): Payer: Medicare Other | Admitting: Emergency Medicine

## 2016-02-05 ENCOUNTER — Inpatient Hospital Stay (HOSPITAL_COMMUNITY)
Admission: RE | Admit: 2016-02-05 | Discharge: 2016-02-08 | DRG: 252 | Disposition: A | Discharge status: Home Health | Payer: Medicare Other | Source: Ambulatory Visit | Attending: Surgery | Admitting: Surgery

## 2016-02-05 ENCOUNTER — Encounter (HOSPITAL_COMMUNITY): Payer: Self-pay | Admitting: *Deleted

## 2016-02-05 DIAGNOSIS — I723 Aneurysm of iliac artery: Secondary | ICD-10-CM | POA: Diagnosis not present

## 2016-02-05 DIAGNOSIS — Z8249 Family history of ischemic heart disease and other diseases of the circulatory system: Secondary | ICD-10-CM

## 2016-02-05 DIAGNOSIS — I252 Old myocardial infarction: Secondary | ICD-10-CM | POA: Diagnosis not present

## 2016-02-05 DIAGNOSIS — Z86718 Personal history of other venous thrombosis and embolism: Secondary | ICD-10-CM | POA: Diagnosis not present

## 2016-02-05 DIAGNOSIS — Z7901 Long term (current) use of anticoagulants: Secondary | ICD-10-CM

## 2016-02-05 DIAGNOSIS — Z833 Family history of diabetes mellitus: Secondary | ICD-10-CM

## 2016-02-05 DIAGNOSIS — Z23 Encounter for immunization: Secondary | ICD-10-CM

## 2016-02-05 DIAGNOSIS — E785 Hyperlipidemia, unspecified: Secondary | ICD-10-CM | POA: Diagnosis present

## 2016-02-05 DIAGNOSIS — E43 Unspecified severe protein-calorie malnutrition: Secondary | ICD-10-CM | POA: Diagnosis not present

## 2016-02-05 DIAGNOSIS — Z87891 Personal history of nicotine dependence: Secondary | ICD-10-CM | POA: Diagnosis not present

## 2016-02-05 DIAGNOSIS — Z7951 Long term (current) use of inhaled steroids: Secondary | ICD-10-CM

## 2016-02-05 DIAGNOSIS — J449 Chronic obstructive pulmonary disease, unspecified: Secondary | ICD-10-CM | POA: Diagnosis not present

## 2016-02-05 DIAGNOSIS — D62 Acute posthemorrhagic anemia: Secondary | ICD-10-CM | POA: Diagnosis not present

## 2016-02-05 DIAGNOSIS — Z681 Body mass index (BMI) 19 or less, adult: Secondary | ICD-10-CM

## 2016-02-05 DIAGNOSIS — I724 Aneurysm of artery of lower extremity: Secondary | ICD-10-CM | POA: Diagnosis not present

## 2016-02-05 DIAGNOSIS — Z85828 Personal history of other malignant neoplasm of skin: Secondary | ICD-10-CM | POA: Diagnosis not present

## 2016-02-05 DIAGNOSIS — Z8673 Personal history of transient ischemic attack (TIA), and cerebral infarction without residual deficits: Secondary | ICD-10-CM

## 2016-02-05 DIAGNOSIS — K219 Gastro-esophageal reflux disease without esophagitis: Secondary | ICD-10-CM | POA: Diagnosis not present

## 2016-02-05 DIAGNOSIS — F0390 Unspecified dementia without behavioral disturbance: Secondary | ICD-10-CM | POA: Diagnosis not present

## 2016-02-05 DIAGNOSIS — Z79899 Other long term (current) drug therapy: Secondary | ICD-10-CM

## 2016-02-05 DIAGNOSIS — Z823 Family history of stroke: Secondary | ICD-10-CM | POA: Diagnosis not present

## 2016-02-05 HISTORY — PX: FALSE ANEURYSM REPAIR: SHX5152

## 2016-02-05 HISTORY — PX: ENDOVASCULAR REPAIR OF POPLITEAL ARTERY ANEURYSM: SHX5811

## 2016-02-05 LAB — PROTIME-INR
INR: 0.97
Prothrombin Time: 12.8 seconds (ref 11.4–15.2)

## 2016-02-05 LAB — APTT
APTT: 29 s (ref 24–36)
aPTT: 30 seconds (ref 24–36)

## 2016-02-05 LAB — CBC
HCT: 30.7 % — ABNORMAL LOW (ref 36.0–46.0)
HEMOGLOBIN: 9.7 g/dL — AB (ref 12.0–15.0)
MCH: 31.1 pg (ref 26.0–34.0)
MCHC: 31.6 g/dL (ref 30.0–36.0)
MCV: 98.4 fL (ref 78.0–100.0)
Platelets: 209 10*3/uL (ref 150–400)
RBC: 3.12 MIL/uL — ABNORMAL LOW (ref 3.87–5.11)
RDW: 14.5 % (ref 11.5–15.5)
WBC: 7.9 10*3/uL (ref 4.0–10.5)

## 2016-02-05 SURGERY — REPAIR, PSEUDOANEURYSM
Anesthesia: General | Laterality: Right

## 2016-02-05 MED ORDER — ONDANSETRON HCL 4 MG/2ML IJ SOLN
INTRAMUSCULAR | Status: DC | PRN
  Administered 2016-02-05: 4 mg via INTRAVENOUS

## 2016-02-05 MED ORDER — FENTANYL CITRATE (PF) 100 MCG/2ML IJ SOLN
INTRAMUSCULAR | Status: DC | PRN
  Administered 2016-02-05: 100 ug via INTRAVENOUS

## 2016-02-05 MED ORDER — ALBUTEROL SULFATE (2.5 MG/3ML) 0.083% IN NEBU: 3.0000 mL | INHALATION_SOLUTION | Freq: Four times a day (QID) | RESPIRATORY_TRACT | Status: DC | PRN

## 2016-02-05 MED ORDER — BOOST / RESOURCE BREEZE PO LIQD
1.0000 | Freq: Three times a day (TID) | ORAL | Status: DC
  Administered 2016-02-05 – 2016-02-06 (×2): 1 via ORAL

## 2016-02-05 MED ORDER — LABETALOL HCL 5 MG/ML IV SOLN: 10.0000 mg | INTRAVENOUS | Status: DC | PRN

## 2016-02-05 MED ORDER — MAGNESIUM SULFATE 2 GM/50ML IV SOLN
2.0000 g | Freq: Every day | INTRAVENOUS | Status: DC | PRN
  Filled 2016-02-05: qty 50

## 2016-02-05 MED ORDER — DARIFENACIN HYDROBROMIDE ER 7.5 MG PO TB24
15.0000 mg | ORAL_TABLET | Freq: Every day | ORAL | Status: DC
  Administered 2016-02-05 – 2016-02-07 (×3): 15 mg via ORAL
  Filled 2016-02-05 (×2): qty 1
  Filled 2016-02-05: qty 2

## 2016-02-05 MED ORDER — FERROUS SULFATE 325 (65 FE) MG PO TABS
325.0000 mg | ORAL_TABLET | Freq: Every day | ORAL | Status: DC
  Administered 2016-02-06 – 2016-02-07 (×2): 325 mg via ORAL
  Filled 2016-02-05 (×2): qty 1

## 2016-02-05 MED ORDER — LEVETIRACETAM 250 MG PO TABS
250.0000 mg | ORAL_TABLET | Freq: Two times a day (BID) | ORAL | Status: DC
  Administered 2016-02-05 – 2016-02-07 (×5): 250 mg via ORAL
  Filled 2016-02-05 (×7): qty 1

## 2016-02-05 MED ORDER — LACTATED RINGERS IV SOLN
INTRAVENOUS | Status: DC | PRN
  Administered 2016-02-05: 07:00:00 via INTRAVENOUS

## 2016-02-05 MED ORDER — PHENOL 1.4 % MT LIQD: 1.0000 | OROMUCOSAL | Status: DC | PRN

## 2016-02-05 MED ORDER — EPHEDRINE SULFATE 50 MG/ML IJ SOLN
INTRAMUSCULAR | Status: DC | PRN
  Administered 2016-02-05 (×2): 5 mg via INTRAVENOUS

## 2016-02-05 MED ORDER — PNEUMOCOCCAL VAC POLYVALENT 25 MCG/0.5ML IJ INJ
0.5000 mL | INJECTION | INTRAMUSCULAR | Status: AC
  Administered 2016-02-06: 0.5 mL via INTRAMUSCULAR
  Filled 2016-02-05: qty 0.5

## 2016-02-05 MED ORDER — VANCOMYCIN HCL IN DEXTROSE 1-5 GM/200ML-% IV SOLN: 1000.0000 mg | Freq: Two times a day (BID) | INTRAVENOUS | Status: DC

## 2016-02-05 MED ORDER — SUGAMMADEX SODIUM 200 MG/2ML IV SOLN
INTRAVENOUS | Status: DC | PRN
  Administered 2016-02-05: 83.8 mg via INTRAVENOUS

## 2016-02-05 MED ORDER — TIOTROPIUM BROMIDE MONOHYDRATE 18 MCG IN CAPS
1.0000 | ORAL_CAPSULE | Freq: Every day | RESPIRATORY_TRACT | Status: DC
  Administered 2016-02-06 – 2016-02-08 (×2): 18 ug via RESPIRATORY_TRACT
  Filled 2016-02-05: qty 5

## 2016-02-05 MED ORDER — GUAIFENESIN-DM 100-10 MG/5ML PO SYRP: 15.0000 mL | ORAL_SOLUTION | ORAL | Status: DC | PRN

## 2016-02-05 MED ORDER — PHENYLEPHRINE HCL 10 MG/ML IJ SOLN
INTRAMUSCULAR | Status: DC | PRN
  Administered 2016-02-05: 120 ug via INTRAVENOUS
  Administered 2016-02-05: 40 ug via INTRAVENOUS
  Administered 2016-02-05: 80 ug via INTRAVENOUS

## 2016-02-05 MED ORDER — OXYCODONE-ACETAMINOPHEN 5-325 MG PO TABS
1.0000 | ORAL_TABLET | Freq: Four times a day (QID) | ORAL | Status: DC | PRN
  Administered 2016-02-06: 1 via ORAL
  Filled 2016-02-05: qty 1

## 2016-02-05 MED ORDER — VANCOMYCIN HCL IN DEXTROSE 1-5 GM/200ML-% IV SOLN
1000.0000 mg | INTRAVENOUS | Status: AC
  Administered 2016-02-05: 1000 mg via INTRAVENOUS
  Filled 2016-02-05: qty 200

## 2016-02-05 MED ORDER — PROPOFOL 10 MG/ML IV BOLUS
INTRAVENOUS | Status: DC | PRN
  Administered 2016-02-05: 110 mg via INTRAVENOUS

## 2016-02-05 MED ORDER — MEMANTINE HCL 10 MG PO TABS
10.0000 mg | ORAL_TABLET | Freq: Two times a day (BID) | ORAL | Status: DC
  Administered 2016-02-05 – 2016-02-07 (×5): 10 mg via ORAL
  Filled 2016-02-05 (×5): qty 1

## 2016-02-05 MED ORDER — ACETAMINOPHEN 325 MG PO TABS
325.0000 mg | ORAL_TABLET | ORAL | Status: DC | PRN
  Administered 2016-02-05: 650 mg via ORAL
  Filled 2016-02-05: qty 2

## 2016-02-05 MED ORDER — MOMETASONE FURO-FORMOTEROL FUM 200-5 MCG/ACT IN AERO
2.0000 | INHALATION_SPRAY | Freq: Two times a day (BID) | RESPIRATORY_TRACT | Status: DC
  Administered 2016-02-05 – 2016-02-08 (×5): 2 via RESPIRATORY_TRACT
  Filled 2016-02-05 (×2): qty 8.8

## 2016-02-05 MED ORDER — POTASSIUM CHLORIDE CRYS ER 20 MEQ PO TBCR
20.0000 meq | EXTENDED_RELEASE_TABLET | Freq: Every day | ORAL | Status: DC | PRN
Start: 2016-02-05 — End: 2016-02-08

## 2016-02-05 MED ORDER — OXYCODONE HCL 5 MG PO TABS
5.0000 mg | ORAL_TABLET | Freq: Once | ORAL | Status: AC | PRN
  Administered 2016-02-05: 5 mg via ORAL

## 2016-02-05 MED ORDER — ALUM & MAG HYDROXIDE-SIMETH 200-200-20 MG/5ML PO SUSP: 15.0000 mL | ORAL | Status: DC | PRN

## 2016-02-05 MED ORDER — DOCUSATE SODIUM 100 MG PO CAPS
100.0000 mg | ORAL_CAPSULE | Freq: Every day | ORAL | Status: DC
  Administered 2016-02-06 – 2016-02-07 (×2): 100 mg via ORAL
  Filled 2016-02-05 (×2): qty 1

## 2016-02-05 MED ORDER — MORPHINE SULFATE (PF) 2 MG/ML IV SOLN
1.0000 mg | INTRAVENOUS | Status: DC | PRN
  Administered 2016-02-05 – 2016-02-06 (×2): 1 mg via INTRAVENOUS
  Filled 2016-02-05 (×2): qty 1

## 2016-02-05 MED ORDER — ROCURONIUM BROMIDE 100 MG/10ML IV SOLN
INTRAVENOUS | Status: DC | PRN
  Administered 2016-02-05: 40 mg via INTRAVENOUS
  Administered 2016-02-05: 10 mg via INTRAVENOUS
  Administered 2016-02-05: 20 mg via INTRAVENOUS

## 2016-02-05 MED ORDER — OXYCODONE HCL 5 MG PO TABS
ORAL_TABLET | ORAL | Status: AC
  Filled 2016-02-05: qty 1

## 2016-02-05 MED ORDER — FENTANYL CITRATE (PF) 100 MCG/2ML IJ SOLN
INTRAMUSCULAR | Status: AC
  Filled 2016-02-05: qty 2

## 2016-02-05 MED ORDER — ACETAMINOPHEN 650 MG RE SUPP: 325.0000 mg | RECTAL | Status: DC | PRN

## 2016-02-05 MED ORDER — POLYETHYLENE GLYCOL 3350 17 G PO PACK: 17.0000 g | PACK | Freq: Every day | ORAL | Status: DC | PRN

## 2016-02-05 MED ORDER — SODIUM CHLORIDE 0.9 % IV SOLN
500.0000 mL | Freq: Once | INTRAVENOUS | Status: AC | PRN
  Administered 2016-02-05: 500 mL via INTRAVENOUS

## 2016-02-05 MED ORDER — OXYCODONE HCL 5 MG/5ML PO SOLN: 5.0000 mg | Freq: Once | ORAL | Status: AC | PRN

## 2016-02-05 MED ORDER — BISACODYL 10 MG RE SUPP: 10.0000 mg | Freq: Every day | RECTAL | Status: DC | PRN

## 2016-02-05 MED ORDER — BUDESONIDE 3 MG PO CPEP
6.0000 mg | ORAL_CAPSULE | Freq: Every day | ORAL | Status: DC | PRN
  Filled 2016-02-05: qty 2

## 2016-02-05 MED ORDER — 0.9 % SODIUM CHLORIDE (POUR BTL) OPTIME
TOPICAL | Status: DC | PRN
  Administered 2016-02-05: 1000 mL

## 2016-02-05 MED ORDER — HEPARIN SODIUM (PORCINE) 1000 UNIT/ML IJ SOLN
INTRAMUSCULAR | Status: DC | PRN
  Administered 2016-02-05: 4000 [IU] via INTRAVENOUS

## 2016-02-05 MED ORDER — OXYCODONE-ACETAMINOPHEN 5-325 MG PO TABS
ORAL_TABLET | ORAL | Status: AC
  Filled 2016-02-05: qty 1

## 2016-02-05 MED ORDER — ATORVASTATIN CALCIUM 40 MG PO TABS
40.0000 mg | ORAL_TABLET | Freq: Every day | ORAL | Status: DC
  Administered 2016-02-06 – 2016-02-07 (×2): 40 mg via ORAL
  Filled 2016-02-05 (×2): qty 1

## 2016-02-05 MED ORDER — HEPARIN SODIUM (PORCINE) 5000 UNIT/ML IJ SOLN
INTRAMUSCULAR | Status: DC | PRN
  Administered 2016-02-05: 08:00:00

## 2016-02-05 MED ORDER — ONDANSETRON HCL 4 MG/2ML IJ SOLN: 4.0000 mg | Freq: Once | INTRAMUSCULAR | Status: DC | PRN

## 2016-02-05 MED ORDER — HYDRALAZINE HCL 20 MG/ML IJ SOLN: 5.0000 mg | INTRAMUSCULAR | Status: DC | PRN

## 2016-02-05 MED ORDER — PROTAMINE SULFATE 10 MG/ML IV SOLN
INTRAVENOUS | Status: DC | PRN
  Administered 2016-02-05: 50 mg via INTRAVENOUS

## 2016-02-05 MED ORDER — HEMOSTATIC AGENTS (NO CHARGE) OPTIME
TOPICAL | Status: DC | PRN
  Administered 2016-02-05: 1 via TOPICAL

## 2016-02-05 MED ORDER — FENTANYL CITRATE (PF) 100 MCG/2ML IJ SOLN
INTRAMUSCULAR | Status: AC
  Administered 2016-02-05: 25 ug via INTRAVENOUS
  Filled 2016-02-05: qty 2

## 2016-02-05 MED ORDER — LOPERAMIDE HCL 2 MG PO CAPS: 2.0000 mg | ORAL_CAPSULE | Freq: Four times a day (QID) | ORAL | Status: DC | PRN

## 2016-02-05 MED ORDER — FENTANYL CITRATE (PF) 100 MCG/2ML IJ SOLN
25.0000 ug | INTRAMUSCULAR | Status: DC | PRN
  Administered 2016-02-05 (×3): 25 ug via INTRAVENOUS

## 2016-02-05 MED ORDER — PANTOPRAZOLE SODIUM 40 MG PO TBEC
40.0000 mg | DELAYED_RELEASE_TABLET | Freq: Every day | ORAL | Status: DC
  Administered 2016-02-06 – 2016-02-07 (×2): 40 mg via ORAL
  Filled 2016-02-05 (×2): qty 1

## 2016-02-05 MED ORDER — SODIUM CHLORIDE 0.9 % IV SOLN: INTRAVENOUS | Status: DC

## 2016-02-05 MED ORDER — PHENYLEPHRINE HCL 10 MG/ML IJ SOLN
INTRAVENOUS | Status: DC | PRN
  Administered 2016-02-05: 25 ug/min via INTRAVENOUS

## 2016-02-05 MED ORDER — METOPROLOL TARTRATE 5 MG/5ML IV SOLN: 2.0000 mg | INTRAVENOUS | Status: DC | PRN

## 2016-02-05 MED ORDER — ENSURE ENLIVE PO LIQD: 237.0000 mL | Freq: Two times a day (BID) | ORAL | Status: DC

## 2016-02-05 MED ORDER — CITALOPRAM HYDROBROMIDE 20 MG PO TABS
10.0000 mg | ORAL_TABLET | Freq: Every day | ORAL | Status: DC
  Administered 2016-02-06 – 2016-02-07 (×2): 10 mg via ORAL
  Filled 2016-02-05 (×2): qty 1

## 2016-02-05 MED ORDER — CEREFOLIN NAC 6-90.314-2-600 MG PO TABS: 1.0000 | ORAL_TABLET | Freq: Every day | ORAL | Status: DC

## 2016-02-05 MED ORDER — PROPOFOL 10 MG/ML IV BOLUS
INTRAVENOUS | Status: AC
  Filled 2016-02-05: qty 40

## 2016-02-05 MED ORDER — SODIUM CHLORIDE 0.9 % IV SOLN
INTRAVENOUS | Status: DC
  Administered 2016-02-05: 18:00:00 via INTRAVENOUS

## 2016-02-05 MED ORDER — HEPARIN (PORCINE) IN NACL 100-0.45 UNIT/ML-% IJ SOLN
500.0000 [IU]/h | INTRAMUSCULAR | Status: AC
  Administered 2016-02-05: 500 [IU]/h via INTRAVENOUS
  Filled 2016-02-05: qty 250

## 2016-02-05 MED ORDER — ONDANSETRON HCL 4 MG/2ML IJ SOLN: 4.0000 mg | Freq: Four times a day (QID) | INTRAMUSCULAR | Status: DC | PRN

## 2016-02-05 MED ORDER — LIDOCAINE HCL (CARDIAC) 20 MG/ML IV SOLN
INTRAVENOUS | Status: DC | PRN
  Administered 2016-02-05: 60 mg via INTRAVENOUS
  Administered 2016-02-05: 20 mg via INTRAVENOUS

## 2016-02-05 SURGICAL SUPPLY — 48 items
ADH SKN CLS APL DERMABOND .7 (GAUZE/BANDAGES/DRESSINGS) ×2
BAG ISL DRAPE 18X18 STRL (DRAPES) ×2
BAG ISOLATION DRAPE 18X18 (DRAPES) ×1 IMPLANT
CANISTER SUCTION 2500CC (MISCELLANEOUS) ×4 IMPLANT
CANNULA VESSEL 3MM 2 BLNT TIP (CANNULA) ×3 IMPLANT
CATH EMB 4FR 80CM (CATHETERS) ×3 IMPLANT
CLIP TI MEDIUM 24 (CLIP) ×4 IMPLANT
CLIP TI WIDE RED SMALL 24 (CLIP) ×4 IMPLANT
DERMABOND ADVANCED (GAUZE/BANDAGES/DRESSINGS) ×2
DERMABOND ADVANCED .7 DNX12 (GAUZE/BANDAGES/DRESSINGS) ×2 IMPLANT
DRAPE ISOLATION BAG 18X18 (DRAPES) ×2
ELECT REM PT RETURN 9FT ADLT (ELECTROSURGICAL) ×4
ELECTRODE REM PT RTRN 9FT ADLT (ELECTROSURGICAL) ×2 IMPLANT
GLOVE BIO SURGEON STRL SZ 6.5 (GLOVE) ×4 IMPLANT
GLOVE BIO SURGEONS STRL SZ 6.5 (GLOVE) ×2
GLOVE BIOGEL PI IND STRL 6.5 (GLOVE) ×4 IMPLANT
GLOVE BIOGEL PI IND STRL 7.0 (GLOVE) ×2 IMPLANT
GLOVE BIOGEL PI IND STRL 7.5 (GLOVE) ×2 IMPLANT
GLOVE BIOGEL PI INDICATOR 6.5 (GLOVE) ×8
GLOVE BIOGEL PI INDICATOR 7.0 (GLOVE) ×4
GLOVE BIOGEL PI INDICATOR 7.5 (GLOVE) ×2
GLOVE SURG SS PI 6.5 STRL IVOR (GLOVE) ×9 IMPLANT
GLOVE SURG SS PI 7.0 STRL IVOR (GLOVE) ×3 IMPLANT
GLOVE SURG SS PI 7.5 STRL IVOR (GLOVE) ×4 IMPLANT
GOWN STRL REUS W/ TWL LRG LVL3 (GOWN DISPOSABLE) ×6 IMPLANT
GOWN STRL REUS W/ TWL XL LVL3 (GOWN DISPOSABLE) ×3 IMPLANT
GOWN STRL REUS W/TWL LRG LVL3 (GOWN DISPOSABLE) ×16
GOWN STRL REUS W/TWL XL LVL3 (GOWN DISPOSABLE) ×8
HEMOSTAT SNOW SURGICEL 2X4 (HEMOSTASIS) ×3 IMPLANT
KIT BASIN OR (CUSTOM PROCEDURE TRAY) ×4 IMPLANT
KIT ROOM TURNOVER OR (KITS) ×4 IMPLANT
NS IRRIG 1000ML POUR BTL (IV SOLUTION) ×8 IMPLANT
PACK PERIPHERAL VASCULAR (CUSTOM PROCEDURE TRAY) ×4 IMPLANT
PAD ARMBOARD 7.5X6 YLW CONV (MISCELLANEOUS) ×8 IMPLANT
SUT PROLENE 5 0 C 1 24 (SUTURE) ×4 IMPLANT
SUT PROLENE 6 0 BV (SUTURE) ×4 IMPLANT
SUT SILK 3 0 (SUTURE) ×4
SUT SILK 3-0 18XBRD TIE 12 (SUTURE) ×1 IMPLANT
SUT VIC AB 2-0 CT1 27 (SUTURE) ×8
SUT VIC AB 2-0 CT1 TAPERPNT 27 (SUTURE) ×4 IMPLANT
SUT VIC AB 3-0 SH 27 (SUTURE) ×8
SUT VIC AB 3-0 SH 27X BRD (SUTURE) ×4 IMPLANT
SUT VICRYL 4-0 PS2 18IN ABS (SUTURE) ×8 IMPLANT
SYR 3ML LL SCALE MARK (SYRINGE) ×3 IMPLANT
TAPE UMBILICAL COTTON 1/8X30 (MISCELLANEOUS) IMPLANT
TRAY FOLEY W/METER SILVER 16FR (SET/KITS/TRAYS/PACK) ×4 IMPLANT
UNDERPAD 30X30 (UNDERPADS AND DIAPERS) ×4 IMPLANT
WATER STERILE IRR 1000ML POUR (IV SOLUTION) ×4 IMPLANT

## 2016-02-05 NOTE — OR Nursing (Signed)
Spoke with pt daughter who is POA about previous DNR and if that was still the same. Pt stated that the DNR was to stay the same and that she forgot the POA paperwork at home.

## 2016-02-05 NOTE — Anesthesia Preprocedure Evaluation (Signed)
Anesthesia Evaluation  Patient identified by MRN, date of birth, ID band Patient awake    Reviewed: Allergy & Precautions, NPO status , Patient's Chart, lab work & pertinent test results  Airway Mallampati: II  TM Distance: >3 FB Neck ROM: Full    Dental  (+) Edentulous Upper, Partial Lower   Pulmonary former smoker,    breath sounds clear to auscultation       Cardiovascular  Rhythm:Regular Rate:Normal     Neuro/Psych    GI/Hepatic   Endo/Other    Renal/GU      Musculoskeletal   Abdominal   Peds  Hematology   Anesthesia Other Findings   Reproductive/Obstetrics                             Anesthesia Physical Anesthesia Plan  ASA: III  Anesthesia Plan: General   Post-op Pain Management:    Induction: Intravenous  Airway Management Planned: Oral ETT  Additional Equipment:   Intra-op Plan:   Post-operative Plan: Extubation in OR  Informed Consent: I have reviewed the patients History and Physical, chart, labs and discussed the procedure including the risks, benefits and alternatives for the proposed anesthesia with the patient or authorized representative who has indicated his/her understanding and acceptance.   Dental advisory given  Plan Discussed with: CRNA and Anesthesiologist  Anesthesia Plan Comments:         Anesthesia Quick Evaluation

## 2016-02-05 NOTE — Anesthesia Procedure Notes (Signed)
Procedure Name: Intubation Date/Time: 02/05/2016 7:51 AM Performed by: Tressia Miners LEFFEW Pre-anesthesia Checklist: Patient identified, Patient being monitored, Timeout performed, Emergency Drugs available and Suction available Patient Re-evaluated:Patient Re-evaluated prior to inductionOxygen Delivery Method: Circle System Utilized Preoxygenation: Pre-oxygenation with 100% oxygen Intubation Type: IV induction Ventilation: Mask ventilation without difficulty Laryngoscope Size: Mac and 3 Grade View: Grade I Tube type: Oral Tube size: 7.5 mm Number of attempts: 1 Airway Equipment and Method: Stylet Placement Confirmation: ETT inserted through vocal cords under direct vision,  positive ETCO2 and breath sounds checked- equal and bilateral Secured at: 20 cm Tube secured with: Tape Dental Injury: Teeth and Oropharynx as per pre-operative assessment

## 2016-02-05 NOTE — H&P (View-Only) (Signed)
[facility]ist of [place]  Patient name: Bridget Ball MRN: [id number] DOB: 04-16-1928 Sex: female  REASON FOR VISIT: follow up  HPI: Bridget Ball is a 83 y.o. female returns today for follow-up of her right popliteal artery aneurysm.  She initially presented in transfer her to [facility] from [facility] with recurrent cellulitis of her right leg.  There was concern for possible necrotizing fasciitis and therefore CT scan was performed.  The CT scan revealed a large right popliteal aneurysm compressing the femoral vein which was likely the source of her DVT.  The discoloration in her leg improved with IV antibiotics as well as heparin.  She was ultimately able to be discharged to home with [name].  While in the hospital she did receive cardiology clearance to proceed with surgery.  I also performed vein mapping which showed an adequate saphenous vein.  Past Medical History:  Diagnosis Date  . Anxiety   . Arthritis   . Attention to nephrostomy ([place])    PT HAS NEPHROSTOMY TUBE IN PLACE  . Bruises easily   . Cancer [facility])    Skin cancer  . Chronic diarrhea   . Colitis   . COPD (chronic obstructive pulmonary disease) ([place])   . Dementia   . Depression   . GERD (gastroesophageal reflux disease)   . Hiatal hernia   . Hx of bronchitis   . Hx of pulmonary edema JULY 2015  . Hx of pulmonary edema JULY 2015  . Hx of septic shock JULY 2015  . Hydronephrosis, left   . Hyperlipidemia   . Memory difficulties   . Microscopic colitis   . Myocardial infarction    unknown time   . Renal insufficiency   . Seizures ([place])    last seizure was 4-5 years ago with "brain bleed"  . Shortness of breath    with exertion  . Stroke [facility])    TIA four years ago / stroke NOV 2014    Family History  Problem Relation Age of Onset  . Diabetes Mother   . Stroke Mother   . Heart attack Father     SOCIAL HISTORY: Social History  Substance Use Topics    . Smoking status: Former Smoker    Packs/day: 1.00    Years: 50.00    Types: Cigarettes    Quit date: 11/13/1997  . Smokeless tobacco: Never Used  . Alcohol use 4.2 oz/week    7 Glasses of wine per week     Comment: 2 glasses a night wine (none in a year)      Current Outpatient Prescriptions  Medication Sig Dispense Refill  . albuterol (PROVENTIL HFA;VENTOLIN HFA) 108 (90 BASE) MCG/ACT inhaler Inhale 2 puffs into the lungs every 6 (six) hours as needed for wheezing or shortness of breath. 1 Inhaler 0  . atorvastatin (LIPITOR) 80 MG tablet Take 1 tablet (80 mg total) by mouth daily at 6 PM. (Patient taking differently: Take 40 mg by mouth daily at 6 PM. ) 30 tablet 0  . budesonide (ENTOCORT EC) 3 MG 24 hr capsule Take 6 mg by mouth every morning.     . budesonide-formoterol (SYMBICORT) 160-4.5 MCG/ACT inhaler Inhale 2 puffs into the lungs 2 (two) times daily. 1 Inhaler 0  . citalopram (CELEXA) 10 MG tablet TAKE 1 TABLET BY MOUTH DAILY 90 tablet 0  . feeding supplement, ENSURE ENLIVE, (ENSURE ENLIVE) LIQD Take 237 mLs by mouth 2 (two) times daily between meals. 237 mL 12  .  levETIRAcetam (KEPPRA) 250 MG tablet Take 1 tablet (250 mg total) by mouth 2 (two) times daily. 60 tablet 11  . memantine (NAMENDA) 5 MG tablet SIG:  Take one tablet daily for one week  then: take one tablet in AM and one tablet in PM for one week.  then: take one tablet in AM and two tablets in PM for one week.  then: take two tablets in AM and two tablets in PM. 120 tablet 0  . Methylfol-Algae-B12-Acetylcyst (CEREFOLIN NAC) 6-90.314-2-600 MG TABS Take 1 tablet by mouth daily. 90 tablet 3  . pantoprazole (PROTONIX) 40 MG tablet TAKE ONE TABLET ONCE DAILY WITH BREAKFAST 30 tablet 5  . POTASSIUM PO Take 1 capsule by mouth daily. OTC    . solifenacin (VESICARE) 5 MG tablet Take 5 mg by mouth daily.    Marland Kitchen SPIRIVA HANDIHALER 18 MCG inhalation capsule Place 1 capsule into inhaler and inhale daily.  1  . Amino  Acids-Protein Hydrolys (FEEDING SUPPLEMENT, PRO-STAT SUGAR FREE 64,) LIQD Take 30 mLs by mouth 2 (two) times daily. (Patient not taking: Reported on 01/20/2016) 900 mL 0  . ciprofloxacin (CIPRO) 500 MG tablet Take 1 tablet (500 mg total) by mouth 2 (two) times daily. (Patient not taking: Reported on 01/20/2016) 14 tablet 0  . doxycycline (VIBRA-TABS) 100 MG tablet Take 1 tablet (100 mg total) by mouth every 12 (twelve) hours. (Patient not taking: Reported on 01/20/2016) 14 tablet 0  . ferrous sulfate 325 (65 FE) MG EC tablet Take 325 mg by mouth daily with breakfast.    . magnesium oxide (MAG-OX) 400 (241.3 Mg) MG tablet Take 1 tablet (400 mg total) by mouth 2 (two) times daily. (Patient not taking: Reported on 01/20/2016) 30 tablet 0  . Rivaroxaban (XARELTO) 15 MG TABS tablet Take 1 tablet (15 mg total) by mouth 2 (two) times daily with a meal. 42 tablet 0  . rivaroxaban (XARELTO) 20 MG TABS tablet Take 1 tablet (20 mg total) by mouth daily with supper. (Patient not taking: Reported on 01/20/2016) 30 tablet 0   No current facility-administered medications for this visit.     REVIEW OF SYSTEMS:  [X]  denotes positive finding, [ ]  denotes negative finding Cardiac  Comments:  Chest pain or chest pressure:    Shortness of breath upon exertion: x   Short of breath when lying flat:    Irregular heart rhythm:        Vascular    Pain in calf, thigh, or hip brought on by ambulation:    Pain in feet at night that wakes you up from your sleep:     Blood clot in your veins: x   Leg swelling:  x       Pulmonary    Oxygen at home:    Productive cough:     Wheezing:  x       Neurologic    Sudden weakness in arms or legs:     Sudden numbness in arms or legs:     Sudden onset of difficulty speaking or slurred speech:    Temporary loss of vision in one eye:     Problems with dizziness:         Gastrointestinal    Blood in stool:     Vomited blood:         Genitourinary    Burning when urinating:      Blood in urine:        Psychiatric    Major depression:  Hematologic    Bleeding problems:    Problems with blood clotting too easily:        Skin    Rashes or ulcers:        Constitutional    Fever or chills:      PHYSICAL EXAM: Vitals:   01/20/16 1616  BP: 100/65  Pulse: 82  Resp: 18  Temp: 99.1 F (37.3 C)  TempSrc: Oral  SpO2: 96%  Weight: 90 lb 12.8 oz (41.2 kg)  Height: 5\' 1"  (1.549 m)    GENERAL: The patient is a well-nourished female, in no acute distress. The vital signs are documented above. CARDIAC: There is a regular rate and rhythm.  VASCULAR: Significant improvement in the discoloration of the right leg with minimal edema PULMONARY: Non-labored respirations ABDOMEN: Soft and non-tender with normal pitched bowel sounds.  MUSCULOSKELETAL: There are no major deformities or cyanosis. NEUROLOGIC: No focal weakness or paresthesias are detected. SKIN: There are no ulcers or rashes noted. PSYCHIATRIC: The patient has a normal affect.  DATA:  None  MEDICAL ISSUES: Right popliteal artery aneurysm: I discussed proceeding with open repair of her popliteal aneurysm as I do not think endovascular repair will resolve the compression into the femoral vein.  I will harvest the saphenous vein from the left leg given the DVT in the right leg.  My plan is to isolate the aneurysm and do an interposition graft, however I did discuss that she may require a femoral-popliteal bypass graft, depending on the quality of the artery proximal and distal to the aneurysm.  I contemplated placing an IVC filter preoperatively, however she has been on anticoagulation for at least 6 weeks and therefore I think the likelihood of mobilization of the DVT is low.  I will try to initiate heparin therapy shortly after starting the operation.  This has been scheduled for late January/early February.  She will stop her [name] 2 days before operation   [name], MD Vascular and Vein  Special[facility] [phone] Pager [phone]

## 2016-02-05 NOTE — Care Management Note (Addendum)
Case Management Note  Patient Details  Name: Bridget Ball MRN: [id number] Date of Birth: [birth year]  Subjective/Objective:     s/p resection of popliteal aneurysm with end to end anastomosis of popliteal artery.   She lives with her daughter Ann Held, she has a walker at home that she does not use and she has a cane, she has a PCP, she has no problems getting medications and she has transportation at dc. Await pt/ot eval.  NCM will cont to follow for dc needs.                Action/Plan:   Expected Discharge Date:                  Expected Discharge Plan:  [place]  In-House Referral:     Discharge planning Services  CM Consult  Post Acute Care Choice:    Choice offered to:     DME Arranged:    DME Agency:     HH Arranged:    [place] Agency:     Status of Service:  In process, will continue to follow  If discussed at Long Length of Stay Meetings, dates discussed:    Additional Comments:  Zenon Mayo, RN 02/05/2016, 6:31 PM

## 2016-02-05 NOTE — Anesthesia Postprocedure Evaluation (Signed)
Anesthesia Post Note  Patient: Bridget Ball  Procedure(s) Performed: Procedure(s) (LRB): REPAIR RIGHT POLITEAL  ANEURYSM (Right)  Patient location during evaluation: PACU Anesthesia Type: General Level of consciousness: awake, awake and alert and oriented Pain management: pain level controlled Vital Signs Assessment: post-procedure vital signs reviewed and stable Respiratory status: spontaneous breathing, nonlabored ventilation and respiratory function stable Cardiovascular status: blood pressure returned to baseline Postop Assessment: no headache Anesthetic complications: no       Last Vitals:  Vitals:   02/05/16 1629 02/05/16 1700  BP: 98/66 109/72  Pulse: 93 99  Resp: 17 14  Temp:      Last Pain:  Vitals:   02/05/16 1541  TempSrc: Oral  PainSc:                  Adileny Delon COKER

## 2016-02-05 NOTE — Progress Notes (Signed)
  Day of Surgery Note    Subjective:  C/o being cold  Vitals:   02/05/16 0621  BP: 106/70  Pulse: 99  Resp: 16  Temp: 98.7 F (37.1 C)    Incisions:   Clean and dry with residual dermabond  Extremities:  + doppler signal right DP/PT/peroneal Cardiac:  regular Lungs:  Non labored   Assessment/Plan:  This is a 83 y.o. female who is s/p resection of popliteal aneurysm with end to end anastomosis of popliteal artery.  -pt with doppler signals present right DP/PT/peroneal -pt has had previous DNR orders-RN to speak to family to verify.  If she is DNR, will change order -heparin gtt 500U/hr to start at 1800 per Dr. Trula Slade (hx DVT).  If pt with no evidence of bleeding tomorrow, will restart Xarelto.  -to [place], [place] 02/05/2016 10:06 AM [phone]

## 2016-02-05 NOTE — Transfer of Care (Signed)
Immediate Anesthesia Transfer of Care Note  Patient: Bridget Ball  Procedure(s) Performed: Procedure(s): REPAIR RIGHT POLITEAL  ANEURYSM (Right)  Patient Location: PACU  Anesthesia Type:General  Level of Consciousness: awake, alert , patient cooperative and responds to stimulation  Airway & Oxygen Therapy: Patient Spontanous Breathing and Patient connected to face mask oxygen  Post-op Assessment: Report given to RN, Post -op Vital signs reviewed and stable and Patient moving all extremities X 4  Post vital signs: Reviewed and stable  Last Vitals:  Vitals:   02/05/16 0621  BP: 106/70  Pulse: 99  Resp: 16  Temp: 37.1 C    Last Pain:  Vitals:   02/05/16 0621  TempSrc: Oral  PainSc: 5          Complications: No apparent anesthesia complications

## 2016-02-05 NOTE — Op Note (Signed)
    Patient name: Bridget Ball MRN: [id number] DOB: [birth year] Sex: female  02/05/2016 Pre-operative Diagnosis: Right popliteal aneurysm Post-operative diagnosis:  Same Surgeon:  [name] Assistants:  Gerri Lins Procedure:   Primary repair of right popliteal artery aneurysm Anesthesia:  general Blood Loss:  See anesthesia record Specimens:  None  Findings:  Focal aneurysm off the distal superficial femoral/proximal popliteal artery.  This was able to be resected and there was enough redundant artery to perform a primary end to end anastomosis  Indications:  The patient was found to have a popliteal aneurysm when she presented with edema and erythema of the right leg secondary to a right leg DVT.  It appears that the aneurysm is causing compression of the femoral vein causing the DVT.  She is been on anticoagulation for at least a month and now comes in today for her operation.  Procedure:  The patient was identified in the holding area and taken to [place] 11  The patient was then placed supine on the table. general anesthesia was administered.  The patient was prepped and draped in the usual sterile fashion.  A time out was called and antibiotics were administered.  A medial above-knee incision was made after locating the aneurysm with ultrasound.  Cautery was used in the subcutaneous tissue down to the fascia which was opened sharply.  The aneurysm was easily palpable.  Combination of Bovie cautery and sharp dissection was used to isolate the aneurysm.  I mobilized the superficial femoral and popliteal artery proximal and distally.  I felt that there was enough mobility within the artery to attempt primary repair.  The patient was fully heparinized.  After this circulated the artery was occluded proximal and distal to the aneurysm.  Aneurysm was opened with an 11 blade and extended longitudinally with Potts scissors.  I ended up transecting the aneurysm, and had enough length to  perform a primary end to end anastomosis.  This was done after spatulating the artery and performing end-to-end anastomosis with 5-0 Prolene.  Prior to completion I passed a #4 Fogarty proximal and distal to make sure there were no clamp injury as there was a mild amount of calcification within the artery.  The anastomosis was completed and blood flow was reestablished to the leg.  The patient had brisk Doppler signals in all 3 tibial vessels.  No arterial gram was performed.  50 mg protamine was given.  Once hemostasis was satisfactory the fascia was reapproximated with 2-0 Vicryl and subcutaneous tissue was reapproximated to of Vicryl followed by 4-0 Vicryl and skin and Dermabond.  There were no complications.   Disposition:  To PACU in stable condition.   Theotis Burrow, M.D. [facility] of [place] Office: [phone] Pager:  [phone]

## 2016-02-05 NOTE — Progress Notes (Signed)
Pt blood pressure is 91/63.Pt is asymptomatic. Giving 500cc bolus per md order. Will continue to monitor.

## 2016-02-05 NOTE — Interval H&P Note (Signed)
History and Physical Interval Note:  02/05/2016 7:22 AM  Bridget Ball  has presented today for surgery, with the diagnosis of Right popliteal aneurysm  The various methods of treatment have been discussed with the patient and family. After consideration of risks, benefits and other options for treatment, the patient has consented to  Procedure(s): BYPASS GRAFT POPLITEAL TO POPLITEAL WITH LEFT SAPHENOUS VEIN (Right) LEFT SAPHENOUS VEIN HARVEST (Left) as a surgical intervention .  The patient's history has been reviewed, patient examined, no change in status, stable for surgery.  I have reviewed the patient's chart and labs.  Questions were answered to the patient's satisfaction.     [name]

## 2016-02-06 ENCOUNTER — Inpatient Hospital Stay (HOSPITAL_COMMUNITY): Payer: Medicare Other

## 2016-02-06 ENCOUNTER — Encounter (HOSPITAL_COMMUNITY): Payer: Self-pay | Admitting: Surgery

## 2016-02-06 DIAGNOSIS — I724 Aneurysm of artery of lower extremity: Secondary | ICD-10-CM

## 2016-02-06 LAB — BASIC METABOLIC PANEL
Anion gap: 6 (ref 5–15)
BUN: 5 mg/dL — AB (ref 6–20)
CO2: 24 mmol/L (ref 22–32)
Calcium: 8.1 mg/dL — ABNORMAL LOW (ref 8.9–10.3)
Chloride: 112 mmol/L — ABNORMAL HIGH (ref 101–111)
Creatinine, Ser: 0.78 mg/dL (ref 0.44–1.00)
GFR calc Af Amer: >60 mL/min (ref >60)
GLUCOSE: 79 mg/dL (ref 65–99)
POTASSIUM: 3.5 mmol/L (ref 3.5–5.1)
Sodium: 142 mmol/L (ref 135–145)

## 2016-02-06 LAB — CBC
HEMATOCRIT: 29 % — AB (ref 36.0–46.0)
HEMOGLOBIN: 9.2 g/dL — AB (ref 12.0–15.0)
MCH: 31.2 pg (ref 26.0–34.0)
MCHC: 31.7 g/dL (ref 30.0–36.0)
MCV: 98.3 fL (ref 78.0–100.0)
Platelets: 188 10*3/uL (ref 150–400)
RBC: 2.95 MIL/uL — ABNORMAL LOW (ref 3.87–5.11)
RDW: 14.6 % (ref 11.5–15.5)
WBC: 5.5 10*3/uL (ref 4.0–10.5)

## 2016-02-06 LAB — APTT: aPTT: 40 seconds — ABNORMAL HIGH (ref 24–36)

## 2016-02-06 LAB — HEPARIN LEVEL (UNFRACTIONATED)

## 2016-02-06 MED ORDER — ADULT MULTIVITAMIN W/MINERALS CH
1.0000 | ORAL_TABLET | Freq: Every day | ORAL | Status: DC
  Administered 2016-02-06 – 2016-02-07 (×2): 1 via ORAL
  Filled 2016-02-06 (×2): qty 1

## 2016-02-06 MED ORDER — ENSURE ENLIVE PO LIQD
237.0000 mL | Freq: Three times a day (TID) | ORAL | Status: DC
  Administered 2016-02-06 – 2016-02-07 (×2): 237 mL via ORAL

## 2016-02-06 MED ORDER — SODIUM CHLORIDE 0.9 % IV BOLUS (SEPSIS)
500.0000 mL | Freq: Once | INTRAVENOUS | Status: AC
  Administered 2016-02-06: 500 mL via INTRAVENOUS

## 2016-02-06 NOTE — Evaluation (Signed)
Physical Therapy Evaluation Patient Details Name: QUINTASHA SCHOENDORF MRN: [id number] DOB: [birth year] Today's Date: 02/06/2016   History of Present Illness  Pt is an 83 y/o female s/p R popliteal artery aneurysm repair PMH: COPD, former smoker,seizures, stroke, memory deficit.  Clinical Impression  Pt presented supine in bed with HOB elevated, awake and willing to participate in therapy session. Prior to admission, pt reported that she was mod I with use of quad cane to ambulate and independent with ADLs. Pt currently requires min guard to achieve sitting EOB, mod A to return to supine, min A for stability with transfers and min guard for safety with ambulation with use of RW. Pt would continue to benefit from skilled physical therapy services at this time while admitted and after d/c to address her limitations in order to improve her overall safety and independence with functional mobility.      Follow Up Recommendations Home health PT;Supervision/Assistance - 24 hour    Equipment Recommendations  3in1 (PT)    Recommendations for Other Services       Precautions / Restrictions Precautions Precautions: Fall Restrictions Weight Bearing Restrictions: No      Mobility  Bed Mobility Overal bed mobility: Needs Assistance Bed Mobility: Supine to Sit;Sit to Supine     Supine to sit: Min guard;HOB elevated Sit to supine: Mod assist   General bed mobility comments: increased time and inefficient with supine to sit, assist for LEs back into bed  Transfers Overall transfer level: Needs assistance Equipment used: Rolling walker (2 wheeled) Transfers: Sit to/from Stand Sit to Stand: Min assist         General transfer comment: cues for hand placement, steadying assist  Ambulation/Gait Ambulation/Gait assistance: Min guard Ambulation Distance (Feet): 30 Feet Assistive device: Rolling walker (2 wheeled) Gait Pattern/deviations: Step-to pattern;Step-through pattern;Decreased step  length - right;Decreased step length - left;Decreased stride length;Trunk flexed Gait velocity: decreased Gait velocity interpretation: Below normal speed for age/gender General Gait Details: mild instability but no LOB, min guard for safety  Stairs            Wheelchair Mobility    Modified Rankin (Stroke Patients Only)       Balance Overall balance assessment: Needs assistance   Sitting balance-Leahy Scale: Poor Sitting balance - Comments: extending LEs due to pain     Standing balance-Leahy Scale: Poor Standing balance comment: dependent on B UE support of walker                             Pertinent Vitals/Pain Pain Assessment: 0-10 Pain Score: 10-Worst pain ever Pain Location: L LE Pain Descriptors / Indicators: Aching;Operative site guarding;Grimacing;Guarding Pain Intervention(s): Repositioned;Monitored during session    Home Living Family/patient expects to be discharged to:: Private residence Living Arrangements: Children (daughter) Available Help at Discharge: Family;Available 24 hours/day Type of Home: House Home Access: Level entry     Home Layout: One level Home Equipment: Mining engineer - 2 wheels Additional Comments: has been with her daughter for 2 weeks    Prior Function Level of Independence: Independent with assistive device(s)         Comments: assists daughter with cooking     Hand Dominance   Dominant Hand: Right    Extremity/Trunk Assessment   Upper Extremity Assessment Upper Extremity Assessment: Defer to OT evaluation    Lower Extremity Assessment Lower Extremity Assessment: Generalized weakness       Communication  Communication: No difficulties  Cognition Arousal/Alertness: Awake/alert Behavior During Therapy: WFL for tasks assessed/performed Overall Cognitive Status: No family/caregiver present to determine baseline cognitive functioning Area of Impairment: Memory     Memory: Decreased  short-term memory         General Comments: per chart, pt has baseline memory deficits    General Comments      Exercises     Assessment/Plan    PT Assessment Patient needs continued PT services  PT Problem List Decreased strength;Decreased activity tolerance;Decreased balance;Decreased mobility;Decreased coordination;Decreased cognition;Decreased knowledge of use of DME;Decreased safety awareness;Pain          PT Treatment Interventions DME instruction;Gait training;Stair training;Functional mobility training;Therapeutic activities;Therapeutic exercise;Balance training;Neuromuscular re-education;Cognitive remediation;Patient/family education    PT Goals (Current goals can be found in the Care Plan section)  Acute Rehab PT Goals Patient Stated Goal: return home with her daughter's home PT Goal Formulation: With patient Time For Goal Achievement: 02/20/16 Potential to Achieve Goals: Good    Frequency Min 3X/week   Barriers to discharge        Co-evaluation PT/OT/SLP Co-Evaluation/Treatment: Yes Reason for Co-Treatment: For patient/therapist safety;To address functional/ADL transfers PT goals addressed during session: Mobility/safety with mobility;Balance;Proper use of DME;Strengthening/ROM OT goals addressed during session: ADL's and self-care       End of Session Equipment Utilized During Treatment: Gait belt Activity Tolerance: Patient limited by pain Patient left: in bed;with call bell/phone within reach;with bed alarm set Nurse Communication: Mobility status         Time: [id number] PT Time Calculation (min) (ACUTE ONLY): 27 min   Charges:   PT Evaluation $PT Eval Moderate Complexity: 1 Procedure     PT G CodesClearnce Sorrel Shyleigh Daughtry 02/06/2016, 12:26 PM Sherie Don, [place], DPT [phone]

## 2016-02-06 NOTE — Progress Notes (Addendum)
Initial Nutrition Assessment  DOCUMENTATION CODES:   Severe malnutrition in context of chronic illness, Underweight  INTERVENTION:    Ensure Enlive PO TID, each supplement provides 350 kcal and 20 grams of protein  MVI daily  NUTRITION DIAGNOSIS:   Malnutrition related to chronic illness as evidenced by severe depletion of body fat, severe depletion of muscle mass.  GOAL:   Patient will meet greater than or equal to 90% of their needs  MONITOR:   PO intake, Supplement acceptance, Labs, Skin  REASON FOR ASSESSMENT:   Malnutrition Screening Tool    ASSESSMENT:   83 yo female admitted on 1/31 for surgery. S/P primary repair of right popliteal artery aneurysm on admission.  Patient reports that she lost 40 lbs 4 months ago. From review of usual weights in EMR, patient has been 95-105 lbs for the past 2.5 years. Noted hx of memory difficulties. She says she has been drinking Ensure TID at home, but just can't put on any weight. She likes the [place] supplement that is ordered, but would prefer Ensure; RD will change supplement order. Nutrition-Focused physical exam completed. Findings are severe fat depletion, severe muscle depletion, and mild edema.  Patient with severe PCM. Labs reviewed. Medications reviewed and include colace and ferrous sulfate.  Diet Order:  Diet Heart Room service appropriate? Yes; Fluid consistency: Thin  Skin:  Wound (see comment) (BLE wounds)  Last BM:  1/30  Height:   Ht Readings from Last 1 Encounters:  02/05/16 5\' 1"  (1.549 m)    Weight:   Wt Readings from Last 1 Encounters:  02/05/16 90 lb 13.3 oz (41.2 kg)    Ideal Body Weight:  47.7 kg  BMI:  Body mass index is 17.16 kg/m. Underweight  Estimated Nutritional Needs:   Kcal:  1250-1450  Protein:  60-70 gm  Fluid:  1.5 L  EDUCATION NEEDS:   No education needs identified at this time  Molli Barrows, [place], [place], [place] Pager [phone] After Hours Pager [phone]

## 2016-02-06 NOTE — Progress Notes (Signed)
VASCULAR LAB PRELIMINARY  ARTERIAL  ABI completed:    RIGHT    LEFT    PRESSURE WAVEFORM  PRESSURE WAVEFORM  BRACHIAL 102 Triphasic BRACHIAL 91 Triphasic  DP 85 Biphasic DP 71 Monophasic  AT   AT    PT 132 Monophasic PT 135 Monophasic  PER   PER    GREAT TOE  NA GREAT TOE  NA    RIGHT LEFT  ABI 1.29 1.32    Bilateral ABIs are slightly elevated, suggestive of medial calcification.  02/06/2016 2:14 PM Maudry Mayhew, BS, RVT, RDCS, RDMS

## 2016-02-06 NOTE — Progress Notes (Signed)
RN attempted to call report for second time, RN on lunch, and Charge RN busy with patient, asking RN to try to call back again. RN will attempt to call back to give report for third time. Patient transferring to 2W

## 2016-02-06 NOTE — Progress Notes (Signed)
Received report from [facility]east. Cleatus Goodin, Bettina Gavia RN

## 2016-02-06 NOTE — Progress Notes (Signed)
RN tried to called report to RN on 2w. Secretary said RN was in another room, asked if I could call back. RN will attempt to call back

## 2016-02-06 NOTE — Progress Notes (Signed)
  Progress Note    02/06/2016 7:46 AM 1 Day Post-Op  Subjective:  "I slept beautifully last night and just woke up"  Afebrile HR  70's-90's NSR [id number] systolic [id number] RA  Gtts:  heparin  Vitals:   02/06/16 0600 02/06/16 0743  BP: 102/66 (!) 116/58  Pulse:  88  Resp: 18 16  Temp:      Physical Exam: Cardiac:  regular Lungs:  Non labored (just received breathing treatment) Incisions:  Clean and dry with some ecchymosis Extremities:  Easily palpable right DP; motor/sensation are in tact   CBC    Component Value Date/Time   WBC 5.5 02/06/2016 0411   RBC 2.95 (L) 02/06/2016 0411   HGB 9.2 (L) 02/06/2016 0411   HCT 29.0 (L) 02/06/2016 0411   PLT 188 02/06/2016 0411   MCV 98.3 02/06/2016 0411   MCH 31.2 02/06/2016 0411   MCHC 31.7 02/06/2016 0411   RDW 14.6 02/06/2016 0411   LYMPHSABS 0.4 (L) 12/13/2015 1158   MONOABS 0.3 12/13/2015 1158   EOSABS 0.0 12/13/2015 1158   BASOSABS 0.0 12/13/2015 1158    BMET    Component Value Date/Time   NA 142 02/06/2016 0411   NA 142 06/24/2015 1425   K 3.5 02/06/2016 0411   CL 112 (H) 02/06/2016 0411   CO2 24 02/06/2016 0411   GLUCOSE 79 02/06/2016 0411   BUN 5 (L) 02/06/2016 0411   BUN 16 06/24/2015 1425   CREATININE 0.78 02/06/2016 0411   CALCIUM 8.1 (L) 02/06/2016 0411   GFRNONAA >60 02/06/2016 0411   GFRAA >60 02/06/2016 0411    INR    Component Value Date/Time   INR 0.97 02/05/2016 0606     Intake/Output Summary (Last 24 hours) at 02/06/16 0746 Last data filed at 02/06/16 [id number]  Gross per 24 hour  Intake          2386.66 ml  Output             1965 ml  Net           421.66 ml     Assessment:  83 y.o. female is s/p:   Primary repair of right popliteal artery aneurysm  1 Day Post-Op  Plan: -pt is doing well this morning with easily palpable right DP pulse.  Motor/sensation are in tact.   -she has not been out of bed yet this morning-will start mobilizing today -acute surgical blood loss  anemia-tolerating -DVT prophylaxis:  Heparin gtt-most likely will restart Xarelto today-will d/w Dr. Trula Slade. -continue nutritional supplement shakes -transfer to [place], PA-C [facility] [phone] 02/06/2016 7:46 AM  Agree with the above Incisions ok Palpable DP pulse on right Transfer to [place] to start [name] for h/o DVT  Bridget Ball

## 2016-02-06 NOTE — Evaluation (Signed)
Occupational Therapy Evaluation Patient Details Name: Bridget Ball MRN: [id number] DOB: 1928/05/16 Today's Date: 02/06/2016    History of Present Illness (P) Pt is an 83 y/o female s/p R popliteal artery aneurysm repair PMH: COPD, former smoker,seizures, stroke, memory deficit.   Clinical Impression   Pt reports walking with a quad cane and being able to care for herself at her daughter's home prior to admission. She prepares meals in conjunction with her daughter. Pt presents with impaired memory, R LE pain and decreased balance interfering with ability to perform at her baseline. Pt requires min assist for OOB mobility and min to max assist for ADL. Focus of therapy will be on educating family in care of pt at home.    Follow Up Recommendations  Home health OT;Supervision/Assistance - 24 hour    Equipment Recommendations  3 in 1 bedside commode    Recommendations for Other Services       Precautions / Restrictions Precautions Precautions: (P) Fall Restrictions Weight Bearing Restrictions: (P) No      Mobility Bed Mobility Overal bed mobility: Needs Assistance Bed Mobility: Supine to Sit;Sit to Supine     Supine to sit: Min guard;HOB elevated Sit to supine: Mod assist   General bed mobility comments: increased time and inefficient with supine to sit, assist for LEs back into bed  Transfers Overall transfer level: Needs assistance Equipment used: Rolling walker (2 wheeled) Transfers: Sit to/from Stand Sit to Stand: Min assist         General transfer comment: cues for hand placement, steadying assist    Balance Overall balance assessment: Needs assistance   Sitting balance-Leahy Scale: Poor Sitting balance - Comments: extending LEs due to pain     Standing balance-Leahy Scale: Poor Standing balance comment: dependent on B UE support of walker                            ADL Overall ADL's : Needs assistance/impaired Eating/Feeding: Set  up;Sitting   Grooming: Wash/dry hands;Sitting;Set up   Upper Body Bathing: Minimal assistance;Sitting   Lower Body Bathing: Maximal assistance;Sit to/from stand   Upper Body Dressing : Minimal assistance;Sitting   Lower Body Dressing: Maximal assistance;Sit to/from stand   Toilet Transfer: Minimal assistance;Ambulation;BSC;RW   Toileting- Clothing Manipulation and Hygiene: Minimal assistance;Sit to/from stand       Functional mobility during ADLs: Minimal assistance;Rolling walker;Cueing for safety       Vision     Perception     Praxis      Pertinent Vitals/Pain Pain Assessment: 0-10 Pain Score: 10-Worst pain ever Pain Location: L LE Pain Descriptors / Indicators: Aching;Operative site guarding;Grimacing;Guarding Pain Intervention(s): Repositioned;Premedicated before session;Monitored during session;Limited activity within patient's tolerance     Hand Dominance Right   Extremity/Trunk Assessment Upper Extremity Assessment Upper Extremity Assessment: Overall WFL for tasks assessed   Lower Extremity Assessment Lower Extremity Assessment: Defer to PT evaluation       Communication Communication Communication: No difficulties   Cognition Arousal/Alertness: Awake/alert Behavior During Therapy: WFL for tasks assessed/performed Overall Cognitive Status: No family/caregiver present to determine baseline cognitive functioning Area of Impairment: Memory     Memory: Decreased short-term memory         General Comments: per chart, pt has baseline memory deficits   General Comments       Exercises       Shoulder Instructions      Home Living Family/patient expects to  be discharged to:: (P) Private residence Living Arrangements: (P) Children (daughter) Available Help at Discharge: (P) Family;Available 24 hours/day Type of Home: (P) House Home Access: (P) Level entry     Home Layout: (P) One level     Bathroom Shower/Tub: (P) Tub/shower unit    Bathroom Toilet: (P) Standard     Home Equipment: (P) Cane - quad;Walker - 2 wheels   Additional Comments: (P) has been with her daughter for 2 weeks      Prior Functioning/Environment Level of Independence: Independent with assistive device(s)        Comments: assists daughter with cooking        OT Problem List: Decreased strength;Decreased activity tolerance;Impaired balance (sitting and/or standing);Decreased cognition;Decreased safety awareness;Decreased knowledge of use of DME or AE;Pain   OT Treatment/Interventions: Self-care/ADL training;DME and/or AE instruction;Patient/family education;Balance training    OT Goals(Current goals can be found in the care plan section) Acute Rehab OT Goals Patient Stated Goal: return home with her daughter's home OT Goal Formulation: With patient Time For Goal Achievement: 02/13/16 Potential to Achieve Goals: Good ADL Goals Pt Will Perform Grooming: with supervision;standing Pt Will Perform Lower Body Bathing: with min assist;sit to/from stand;with caregiver independent in assisting Pt Will Perform Lower Body Dressing: with min assist;with caregiver independent in assisting;sit to/from stand Pt Will Transfer to Toilet: with supervision;ambulating;bedside commode (over toilet) Pt Will Perform Toileting - Clothing Manipulation and hygiene: with supervision;sit to/from stand  OT Frequency: Min 2X/week   Barriers to D/C:            Co-evaluation PT/OT/SLP Co-Evaluation/Treatment: Yes Reason for Co-Treatment: (P) For patient/therapist safety;To address functional/ADL transfers PT goals addressed during session: (P) Mobility/safety with mobility;Balance;Proper use of DME;Strengthening/ROM OT goals addressed during session: ADL's and self-care      End of Session Equipment Utilized During Treatment: Gait belt;Rolling walker Nurse Communication: Mobility status  Activity Tolerance: Patient tolerated treatment well Patient left: in  bed;with call bell/phone within reach;with bed alarm set   Time: [id number] OT Time Calculation (min): 27 min Charges:  OT General Charges $OT Visit: 1 Procedure OT Evaluation $OT Eval Moderate Complexity: 1 Procedure G-Codes:    Malka So 02/06/2016, 12:23 PM  [phone]

## 2016-02-06 NOTE — Progress Notes (Signed)
Systolic BP mid 99991111; pt asymptomatic resting in bed.   500cc Normal Saline bolus administered per MD electronic order.

## 2016-02-06 NOTE — Progress Notes (Signed)
Improved BP s/p 500cc Normal Saline bolus.  Maintaining systolic BP [id number].  Pt remains asymptomatic resting in bed.  Lower extremity pulses easily palpable (see vascular assessments for detail).  Denies any pain or needs at this time.

## 2016-02-06 NOTE — Consult Note (Signed)
   [facility] CM Inpatient Consult   02/06/2016  Bridget Ball 10-Oct-1928 [id number]     Patient screened for [facility] services. Chart reviewed. Noted [facility] has attempted multiple times to reach patient without success. Went to bedside to speak with patient to discuss whether she wanted [place] [facility] follow up. She states " I just have so much going on, I do not know what I need. They have been trying to figure out what is going on with me".   Discussed that [place] has attempted to reach her to engage for [facility] services, as well her daughter. [name] simply laughed. Denies having issues with transportation or with medications. Has supportive family. Lives alone. States she will call [place] [facility] should she become interested in the future. Left contact information and [facility] Care Management brochure. Made inpatient RNCM aware [name] pleasantly declined [place] [facility] at this time. Will make [facility] office aware.    Marthenia Rolling, MSN-Ed, RN,BSN [facility] Liaison [phone]

## 2016-02-06 NOTE — Progress Notes (Signed)
Patient arrived to 2w 19, monitor applied CCMD made aware, and vital signs received. Patient to chair call bell with in reach will monitor patient. Cayleigh Paull, Bettina Gavia RN

## 2016-02-07 LAB — HEPARIN LEVEL (UNFRACTIONATED)

## 2016-02-07 LAB — APTT: APTT: 42 s — AB (ref 24–36)

## 2016-02-07 MED ORDER — RIVAROXABAN 20 MG PO TABS: 20.0000 mg | ORAL_TABLET | Freq: Every day | ORAL | 2 refills | Status: DC

## 2016-02-07 MED ORDER — RIVAROXABAN 20 MG PO TABS
20.0000 mg | ORAL_TABLET | Freq: Every day | ORAL | Status: DC
  Administered 2016-02-07: 20 mg via ORAL
  Filled 2016-02-07: qty 1

## 2016-02-07 MED ORDER — RIVAROXABAN 15 MG PO TABS: 15.0000 mg | ORAL_TABLET | Freq: Every day | ORAL | Status: DC

## 2016-02-07 MED ORDER — RIVAROXABAN 20 MG PO TABS: 20.0000 mg | ORAL_TABLET | Freq: Every day | ORAL | Status: DC

## 2016-02-07 MED ORDER — OXYCODONE-ACETAMINOPHEN 5-325 MG PO TABS: 1.0000 | ORAL_TABLET | Freq: Four times a day (QID) | ORAL | 0 refills | Status: DC | PRN

## 2016-02-07 NOTE — Progress Notes (Signed)
  [facility] Progress Note  Subjective  - POD #2  Feels ok this am. Doesn't remember walking yesterday.   Objective Vitals:   02/06/16 2123 02/07/16 0417  BP: 134/71 117/65  Pulse: (!) 113 91  Resp: 18 18  Temp: 98.5 F (36.9 C) 98.6 F (37 C)    Intake/Output Summary (Last 24 hours) at 02/07/16 [id number] Last data filed at 02/06/16 1600  Gross per 24 hour  Intake           285.17 ml  Output              400 ml  Net          -114.83 ml   Right medial leg incision is clean and intact. There is some ecchymosis/erythema that is soft around the incision, more significant proximally.  2+ right DP pulse.   Data  ABIs 02/06/16   RIGHT    LEFT    PRESSURE WAVEFORM  PRESSURE WAVEFORM  BRACHIAL 102 Triphasic BRACHIAL 91 Triphasic  DP 85 Biphasic DP 71 Monophasic  AT   AT    PT 132 Monophasic PT 135 Monophasic  PER   PER    GREAT TOE  NA GREAT TOE  NA    RIGHT LEFT  ABI 1.29 1.32     Assessment/Planning: 83 y.o. female is s/p: primary repair of right popliteal aneurysm 2 Days Post-Op   Ecchymosis/red discoloration incision right leg likely secondary to anticoagulation and fragile tissue. Xarelto to start today for h/o DVT.  PT/OT recommending HH. Patient does not remember working with therapy. Mobilize patient again today. She will be able to stay with daughter after discharge. Likely d/c over weekend once increasing mobilization.   Bridget Ball 02/07/2016 8:33 AM --  Laboratory CBC    Component Value Date/Time   WBC 5.5 02/06/2016 0411   HGB 9.2 (L) 02/06/2016 0411   HCT 29.0 (L) 02/06/2016 0411   PLT 188 02/06/2016 0411    BMET    Component Value Date/Time   NA 142 02/06/2016 0411   NA 142 06/24/2015 1425   K 3.5 02/06/2016 0411   CL 112 (H) 02/06/2016 0411   CO2 24 02/06/2016 0411   GLUCOSE 79 02/06/2016 0411   BUN 5 (L) 02/06/2016 0411   BUN 16 06/24/2015 1425   CREATININE 0.78 02/06/2016 0411   CALCIUM 8.1  (L) 02/06/2016 0411   GFRNONAA >60 02/06/2016 0411   GFRAA >60 02/06/2016 0411    COAG Lab Results  Component Value Date   INR 0.97 02/05/2016   INR 1.66 01/30/2016   INR 1.25 12/13/2015   No results found for: PTT  Antibiotics Anti-infectives    Start     Dose/Rate Route Frequency Ordered Stop   02/05/16 1315  vancomycin (VANCOCIN) IVPB 1000 mg/200 mL premix  Status:  Discontinued     1,000 mg 200 mL/hr over 60 Minutes Intravenous Every 12 hours 02/05/16 1314 02/05/16 1332   02/05/16 0556  vancomycin (VANCOCIN) IVPB 1000 mg/200 mL premix     1,000 mg 200 mL/hr over 60 Minutes Intravenous 60 min pre-op 02/05/16 0556 02/05/16 [place], PA-C [facility] Office: [phone] Pager: [phone] 02/07/2016 8:33 AM

## 2016-02-07 NOTE — Care Management Note (Signed)
Case Management Note Previous CM note initiated by Zenon Mayo, RN--02/05/2016, 6:31 PM   Patient Details  Name: Bridget Ball MRN: [id number] Date of Birth: 1928-02-12  Subjective/Objective:     s/p resection of popliteal aneurysm with end to end anastomosis of popliteal artery.   She lives with her daughter Ann Held, she has a walker at home that she does not use and she has a cane, she has a PCP, she has no problems getting medications and she has transportation at dc. Await pt/ot eval.  NCM will cont to follow for dc needs.                Action/Plan: Pt tx from 4E to 2W- per PT/OT evals recommendations for [facility] therapy and 3n1- will need orders placed for HPT/OT with F2F- for discharge.  Expected Discharge Date:    02/08/16              Expected Discharge Plan:  [place]  In-House Referral:     Discharge planning Services  CM Consult  Post Acute Care Choice:  Home Health, Durable Medical Equipment Choice offered to:  Patient, Adult Children  DME Arranged:  3-N-1 DME Agency:  [place]:    [place]:  [place]  Status of Service:  In process, will continue to follow  If discussed at Long Length of Stay Meetings, dates discussed:    Discharge Disposition: Home with home Health   Additional Comments:  02/08/16- 1600- Marvetta Gibbons RN, CM- noted pt for d/c tomorrow- recommendations for HHPT/OT and 3n1- order placed for 3n1- notified Brad with [facility] for DME need- 3n1 to be delivered to room  Prior to discharge-  Spoke with pt and son at bedside regarding [place]- choice offered for [facility] agency in [facility]- per choice they would like to use [place] for [facility] services- referral called to [facility] with Alvis Lemmings- who accepted referral for PT/OT- (they will not be able to provide RN)- awaiting orders for HHPT/OT-   Dawayne Patricia, RN 02/07/2016, 4:24 PM [phone]

## 2016-02-07 NOTE — Discharge Instructions (Signed)
Information on my medicine - XARELTO (rivaroxaban)  This medication education was reviewed with me or my healthcare representative as part of my discharge preparation.  The pharmacist that spoke with me during my hospital stay was:  Eudelia Bunch, [place]? Xarelto was prescribed to treat blood clots that may have been found in the veins of your legs (deep vein thrombosis) or in your lungs (pulmonary embolism) and to reduce the risk of them occurring again.  What do you need to know about Xarelto? Your dose has changed to one 20 mg tablet taken ONCE A DAY with your evening meal.  You have finished taking 15 mg po twice daily.   DO NOT stop taking Xarelto without talking to the health care provider who prescribed the medication.  Refill your prescription for 20 mg tablets before you run out.  After discharge, you should have regular check-up appointments with your healthcare provider that is prescribing your Xarelto.  In the future your dose may need to be changed if your kidney function changes by a significant amount.  What do you do if you miss a dose? If you are taking Xarelto TWICE DAILY and you miss a dose, take it as soon as you remember. You may take two 15 mg tablets (total 30 mg) at the same time then resume your regularly scheduled 15 mg twice daily the next day.  If you are taking Xarelto ONCE DAILY and you miss a dose, take it as soon as you remember on the same day then continue your regularly scheduled once daily regimen the next day. Do not take two doses of Xarelto at the same time.   Important Safety Information Xarelto is a blood thinner medicine that can cause bleeding. You should call your healthcare provider right away if you experience any of the following: ? Bleeding from an injury or your nose that does not stop. ? Unusual colored urine (red or dark brown) or unusual colored stools (red or black). ? Unusual bruising for  unknown reasons. ? A serious fall or if you hit your head (even if there is no bleeding).  Some medicines may interact with Xarelto and might increase your risk of bleeding while on Xarelto. To help avoid this, consult your healthcare provider or pharmacist prior to using any new prescription or non-prescription medications, including herbals, vitamins, non-steroidal anti-inflammatory drugs (NSAIDs) and supplements.  This website has more information on Xarelto: [email or web address]

## 2016-02-08 DIAGNOSIS — Z23 Encounter for immunization: Secondary | ICD-10-CM | POA: Diagnosis not present

## 2016-02-08 MED ORDER — RIVAROXABAN 20 MG PO TABS: 20.0000 mg | ORAL_TABLET | Freq: Every day | ORAL | 2 refills | Status: DC

## 2016-02-08 MED ORDER — ACETAMINOPHEN-CODEINE #3 300-30 MG PO TABS: 1.0000 | ORAL_TABLET | Freq: Four times a day (QID) | ORAL | 0 refills | Status: DC | PRN

## 2016-02-08 NOTE — Progress Notes (Signed)
Physical Therapy Treatment Patient Details Name: Bridget Ball MRN: [id number] DOB: 07-Feb-1928 Today's Date: 02/08/2016    History of Present Illness Pt is an 83 y/o female s/p R popliteal artery aneurysm repair PMH: COPD, former smoker,seizures, stroke, memory deficit.    PT Comments    Patient with improved mobility and gait today.  Able to ambulate 67' with RW and supervision.  Patient ready for d/c from PT perspective with f/u HHPT.  Follow Up Recommendations  Home health PT;Supervision/Assistance - 24 hour     Equipment Recommendations  3in1 (PT)    Recommendations for Other Services       Precautions / Restrictions Precautions Precautions: Fall Restrictions Weight Bearing Restrictions: No    Mobility  Bed Mobility               General bed mobility comments: Patient in chair  Transfers Overall transfer level: Needs assistance Equipment used: Rolling walker (2 wheeled) Transfers: Sit to/from Stand Sit to Stand: Supervision         General transfer comment: Cues for hand placement.  No physical assist required.  Ambulation/Gait Ambulation/Gait assistance: Supervision Ambulation Distance (Feet): 64 Feet Assistive device: Rolling walker (2 wheeled) Gait Pattern/deviations: Step-through pattern;Decreased stance time - right;Decreased step length - left;Decreased stride length;Decreased weight shift to right;Antalgic;Trunk flexed Gait velocity: decreased Gait velocity interpretation: Below normal speed for age/gender General Gait Details: Verbal reminders for safe use of RW and to look up during gait.   Stairs            Wheelchair Mobility    Modified Rankin (Stroke Patients Only)       Balance           Standing balance support: Single extremity supported Standing balance-Leahy Scale: Poor                      Cognition Arousal/Alertness: Awake/alert Behavior During Therapy: WFL for tasks assessed/performed Overall  Cognitive Status: History of cognitive impairments - at baseline Area of Impairment: Memory     Memory: Decreased short-term memory         General Comments: per chart, pt has baseline memory deficits    Exercises      General Comments        Pertinent Vitals/Pain Pain Assessment: Faces Faces Pain Scale: Hurts a little bit Pain Location: RLE Pain Descriptors / Indicators: Sore Pain Intervention(s): Monitored during session;Repositioned    Home Living                      Prior Function            PT Goals (current goals can now be found in the care plan section) Acute Rehab PT Goals Patient Stated Goal: return home with her daughter's home Progress towards PT goals: Progressing toward goals    Frequency    Min 3X/week      PT Plan Current plan remains appropriate    Co-evaluation             End of Session Equipment Utilized During Treatment: Gait belt Activity Tolerance: Patient tolerated treatment well Patient left: in chair;with call bell/phone within reach;with family/visitor present;with nursing/sitter in room     Time: 1051-1101 PT Time Calculation (min) (ACUTE ONLY): 10 min  Charges:  $Gait Training: 8-22 mins                    G Codes:  Despina Pole 02/08/2016, 11:58 AM Carita Pian. Sanjuana Kava, [place] Pager [phone]

## 2016-02-08 NOTE — Progress Notes (Signed)
Discharged to home with family office visits in place teaching done  

## 2016-02-08 NOTE — Progress Notes (Addendum)
  [facility] Progress Note  Subjective  - POD #3  Right knee hurt this morning. Says it feels "stiff."  Otherwise ready to go home.   Objective Vitals:   02/07/16 2205 02/08/16 0604  BP: 119/75 114/68  Pulse: 93 81  Resp: 18 18  Temp: 98.5 F (36.9 C) 97.8 F (36.6 C)    Intake/Output Summary (Last 24 hours) at 02/08/16 0855 Last data filed at 02/07/16 1800  Gross per 24 hour  Intake              480 ml  Output                0 ml  Net              480 ml   Right above knee incision clean and intact. No hematoma. Mild ecchymosis around incision.  Right knee without pain to palpation. No pain with flexion or extension.  2+ right DP pulse.   Assessment/Planning: 83 y.o. female is s/p: primary repair of right popliteal aneurysm 3 Days Post-Op   Doing well with PT and OT Started on Xarelto yesterday for h/o DVT D/c home today once [facility] services set up.   [name] 02/08/2016 8:55 AM --  Laboratory CBC    Component Value Date/Time   WBC 5.5 02/06/2016 0411   HGB 9.2 (L) 02/06/2016 0411   HCT 29.0 (L) 02/06/2016 0411   PLT 188 02/06/2016 0411    BMET    Component Value Date/Time   NA 142 02/06/2016 0411   NA 142 06/24/2015 1425   K 3.5 02/06/2016 0411   CL 112 (H) 02/06/2016 0411   CO2 24 02/06/2016 0411   GLUCOSE 79 02/06/2016 0411   BUN 5 (L) 02/06/2016 0411   BUN 16 06/24/2015 1425   CREATININE 0.78 02/06/2016 0411   CALCIUM 8.1 (L) 02/06/2016 0411   GFRNONAA >60 02/06/2016 0411   GFRAA >60 02/06/2016 0411    COAG Lab Results  Component Value Date   INR 0.97 02/05/2016   INR 1.66 01/30/2016   INR 1.25 12/13/2015   No results found for: PTT  Antibiotics Anti-infectives    Start     Dose/Rate Route Frequency Ordered Stop   02/05/16 1315  vancomycin (VANCOCIN) IVPB 1000 mg/200 mL premix  Status:  Discontinued     1,000 mg 200 mL/hr over 60 Minutes Intravenous Every 12 hours 02/05/16 1314 02/05/16 1332   02/05/16 0556   vancomycin (VANCOCIN) IVPB 1000 mg/200 mL premix     1,000 mg 200 mL/hr over 60 Minutes Intravenous 60 min pre-op 02/05/16 0556 02/05/16 0833       Virgina Jock, PA-C [facility] Office: [phone] Pager: [phone] 02/08/2016 8:55 AM   I have examined the patient, reviewed and agree with above.  Curt Jews, MD 02/08/2016 11:52 AM

## 2016-02-11 DIAGNOSIS — M79606 Pain in leg, unspecified: Secondary | ICD-10-CM | POA: Diagnosis not present

## 2016-02-11 DIAGNOSIS — J449 Chronic obstructive pulmonary disease, unspecified: Secondary | ICD-10-CM | POA: Diagnosis not present

## 2016-02-11 DIAGNOSIS — R531 Weakness: Secondary | ICD-10-CM | POA: Diagnosis not present

## 2016-02-11 DIAGNOSIS — I724 Aneurysm of artery of lower extremity: Secondary | ICD-10-CM | POA: Diagnosis not present

## 2016-02-12 NOTE — Discharge Summary (Signed)
[facility] Discharge Summary  Bridget Ball [birth year] 83 y.o. female  [id number]  Admission Date: 02/05/2016  Discharge Date: 02/08/2016  Physician: [name], MD  Admission Diagnosis: Right popliteal aneurysm  HPI:   This is a 83 y.o. female who presented for evaluation of right popliteal artery aneurysm.  She initially presented in transfer here to [facility] from [facility] with recurrent cellulitis of her right leg.  There was concern for possible necrotizing fasciitis and therefore CT scan was performed. The CT scan revealed a large right popliteal aneurysm compressing the femoral vein which was likely the source of her DVT.  The discoloration in her leg improved with IV antibiotics as well as heparin.  She was ultimately able to be discharged to home with [name].  While in the hospital she did receive cardiology clearance to proceed with surgery.   Vein mapping showed an adequate saphenous vein.  Hospital Course:   The patient was admitted to the hospital and taken to the operating room on 02/05/2016 and underwent: primary repair of right popliteal artery aneurysm     The patient tolerated the procedure well and was transported to the PACU in stable condition. She was started on heparin post-operatively for history of DVT.   The patient did well on POD 1. She had a palpable right dorsalis pedis pulse. Her incisions were healing well. She was restarted on Xarelto. She had acute surgical blood loss anemia but was asymptomatic.    RIGHT    LEFT    PRESSURE WAVEFORM  PRESSURE WAVEFORM  BRACHIAL 102 Triphasic BRACHIAL 91 Triphasic  DP 85 Biphasic DP 71 Monophasic  AT   AT    PT 132 Monophasic PT 135 Monophasic  PER   PER    GREAT TOE  NA GREAT TOE  NA    RIGHT LEFT  ABI 1.29 1.32    She was transferred to the floor on POD 1. The remainder of her hospitalization consisted of increasing mobilization. By POD 3, the patient felt like  she was ambulating adequately with assistance. She was discharged home in good condition on POD 3.    CBC    Component Value Date/Time   WBC 5.5 02/06/2016 0411   RBC 2.95 (L) 02/06/2016 0411   HGB 9.2 (L) 02/06/2016 0411   HCT 29.0 (L) 02/06/2016 0411   PLT 188 02/06/2016 0411   MCV 98.3 02/06/2016 0411   MCH 31.2 02/06/2016 0411   MCHC 31.7 02/06/2016 0411   RDW 14.6 02/06/2016 0411   LYMPHSABS 0.4 (L) 12/13/2015 1158   MONOABS 0.3 12/13/2015 1158   EOSABS 0.0 12/13/2015 1158   BASOSABS 0.0 12/13/2015 1158    BMET    Component Value Date/Time   NA 142 02/06/2016 0411   NA 142 06/24/2015 1425   K 3.5 02/06/2016 0411   CL 112 (H) 02/06/2016 0411   CO2 24 02/06/2016 0411   GLUCOSE 79 02/06/2016 0411   BUN 5 (L) 02/06/2016 0411   BUN 16 06/24/2015 1425   CREATININE 0.78 02/06/2016 0411   CALCIUM 8.1 (L) 02/06/2016 0411   GFRNONAA >60 02/06/2016 0411   GFRAA >60 02/06/2016 0411     Discharge Instructions:   The patient is discharged to home with extensive instructions on wound care and progressive ambulation.  They are instructed not to drive or perform any heavy lifting until returning to see the physician in his office.  Discharge Instructions    Call MD for:  redness, tenderness, or  signs of infection (pain, swelling, bleeding, redness, odor or green/yellow discharge around incision site)    Complete by:  As directed    Call MD for:  severe or increased pain, loss or decreased feeling  in affected limb(s)    Complete by:  As directed    Call MD for:  temperature >100.5    Complete by:  As directed    Discharge instructions    Complete by:  As directed    You may shower PRN   Driving Restrictions    Complete by:  As directed    No driving for 1 week   Increase activity slowly    Complete by:  As directed    Walk with assistance use walker or cane as needed   Lifting restrictions    Complete by:  As directed    No  heavylifting for 4 weeks   Resume previous  diet    Complete by:  As directed       Discharge Diagnosis:  Right popliteal aneurysm  Secondary Diagnosis: Patient Active Problem List   Diagnosis Date Noted  . Popliteal artery aneurysm ([place]) 02/05/2016  . Malnutrition of moderate degree 12/15/2015  . DVT of popliteal vein ([place]) 12/14/2015  . Aneurysm of left internal iliac artery ([place]) 12/14/2015  . Sepsis affecting skin ([place]) 12/13/2015  . Dementia 12/13/2015  . GERD (gastroesophageal reflux disease) 12/13/2015  . Severe protein-calorie malnutrition ([place]) 12/13/2015  . Cellulitis of right leg 11/24/2015  . Dyslipidemia 11/24/2015  . Erythema of lower extremity 11/24/2015  . Anemia 11/01/2015  . Esophageal reflux 08/17/2013  . COPD (chronic obstructive pulmonary disease) ([place]) 08/11/2013  . NSTEMI (non-ST elevated myocardial infarction) type 2 07/26/2013  . Dehydration 07/25/2013  . AKI (acute kidney injury) ([place]) 07/25/2013  . Colitis 07/08/2013  . Hydronephrosis, left 07/08/2013  . Microscopic colitis 06/27/2013  . Memory loss 04/25/2013  . Depression 04/25/2013  . SAH (subarachnoid hemorrhage) ([place]) 11/22/2012  . Seizure disorder secondary to prior [facility] 11/22/2012   Past Medical History:  Diagnosis Date  . Anxiety   . Arthritis   . Attention to nephrostomy ([place])    PT HAS NEPHROSTOMY TUBE IN PLACE  . Bruises easily   . Cancer [facility])    Skin cancer  . Chronic diarrhea   . Colitis   . COPD (chronic obstructive pulmonary disease) ([place])   . Dementia   . Depression   . GERD (gastroesophageal reflux disease)   . Hiatal hernia   . Hx of bronchitis   . Hx of pulmonary edema JULY 2015  . Hx of pulmonary edema JULY 2015  . Hx of septic shock JULY 2015  . Hydronephrosis, left   . Hyperlipidemia   . Memory difficulties   . Microscopic colitis   . Myocardial infarction    unknown time   . Renal insufficiency   . Seizures ([place])    last seizure was 4-5 years ago with "brain bleed"  . Shortness of breath    with  exertion  . Stroke [facility])    TIA four years ago / stroke NOV 2014    Tylenol # 3 #20 No Refill  Disposition: Home  Patient's condition: is Good  Follow up: 1. Dr. Trula Slade in 2 weeks   Virgina Jock, PA-C [facility] [phone] 02/12/2016  1:33 PM  - For VQI Registry use --- Instructions: Press F2 to tab through selections.  Delete question if not applicable.   Post-op:  Wound infection: No  Graft infection: No  Transfusion: No  New Arrhythmia: No Ipsilateral amputation: No, [ ]  Minor, [ ]  BKA, [ ]  AKA Discharge ABI: R 1.29, L 1.32 D/C Ambulatory Status: Ambulatory with Assistance  Complications: MI: No, [ ]  Troponin only, [ ]  EKG or Clinical CHF: No Resp failure:No, [ ]  Pneumonia, [ ]  Ventilator Chg in renal function: No, [ ]  Inc. Cr > 0.5, [ ]  Temp. Dialysis, [ ]  Permanent dialysis Stroke: No, [ ]  Minor, [ ]  Major Return to OR: No  Reason for return to OR: [ ]  Bleeding, [ ]  Infection, [ ]  Thrombosis, [ ]  Revision  Discharge medications: Statin use:  yes ASA use:  no Plavix use:  no Beta blocker use: no Coumadin use: no, on Xarelto

## 2016-02-13 DIAGNOSIS — J449 Chronic obstructive pulmonary disease, unspecified: Secondary | ICD-10-CM | POA: Diagnosis not present

## 2016-02-13 DIAGNOSIS — R531 Weakness: Secondary | ICD-10-CM | POA: Diagnosis not present

## 2016-02-13 DIAGNOSIS — M79606 Pain in leg, unspecified: Secondary | ICD-10-CM | POA: Diagnosis not present

## 2016-02-13 DIAGNOSIS — I724 Aneurysm of artery of lower extremity: Secondary | ICD-10-CM | POA: Diagnosis not present

## 2016-02-17 ENCOUNTER — Encounter: Payer: Medicare Other | Admitting: Surgery

## 2016-02-17 ENCOUNTER — Telehealth: Payer: Self-pay | Admitting: *Deleted

## 2016-02-17 NOTE — Telephone Encounter (Signed)
Returned call to patients daughter, [name].  She states that her mother is having severe pain in her right leg.  The patient states the pain is at # 7.  Patient denies fever or drainage.  Also states that a knot is present beside of her right knee.  No warmth noted upon touching.  Notes that ankles are swelling as well.  An appointment was scheduled tomorrow, 02/18/2016 at 11:15 with [place]  NP.

## 2016-02-18 ENCOUNTER — Ambulatory Visit (INDEPENDENT_AMBULATORY_CARE_PROVIDER_SITE_OTHER): Payer: Self-pay | Admitting: Family

## 2016-02-18 ENCOUNTER — Encounter: Payer: Self-pay | Admitting: Family

## 2016-02-18 VITALS — BP 100/68 | HR 89 | Temp 98.2°F | Resp 14 | Ht 61.0 in | Wt 87.0 lb

## 2016-02-18 DIAGNOSIS — J449 Chronic obstructive pulmonary disease, unspecified: Secondary | ICD-10-CM | POA: Diagnosis not present

## 2016-02-18 DIAGNOSIS — I724 Aneurysm of artery of lower extremity: Secondary | ICD-10-CM | POA: Diagnosis not present

## 2016-02-18 DIAGNOSIS — R531 Weakness: Secondary | ICD-10-CM | POA: Diagnosis not present

## 2016-02-18 DIAGNOSIS — G8918 Other acute postprocedural pain: Secondary | ICD-10-CM

## 2016-02-18 DIAGNOSIS — M79606 Pain in leg, unspecified: Secondary | ICD-10-CM | POA: Diagnosis not present

## 2016-02-18 NOTE — Progress Notes (Signed)
Postoperative Visit   History of Present Illness  Bridget Ball is a 83 y.o. year old female who is s/p primary repair of right popliteal artery aneurysm on 02-05-16 by Dr. Trula Slade.  Pt had a focal aneurysm off the distal superficial femoral/proximal popliteal artery.  This was able to be resected and there was enough redundant artery to perform a primary end to end anastomosis The patient was found to have a popliteal aneurysm when she presented with edema and erythema of the right leg secondary to a right leg DVT.  It appeared that the aneurysm was causing compression of the femoral vein causing the DVT.  She had been on anticoagulation for at least a month prior to the surgery.  Pt returns today after phone call yesterday from patients daughter, Bridget Ball.  She states that her mother is having severe pain in her right leg.  The patient states the pain is at # 7.  Patient denies fever, chills, or drainage.  Also states that a knot is present beside of her right knee.  No warmth noted upon touching.   Daughter states her mother has dementia, and that her mother is currently living with her.  She states her mother is able to sleep well overnight, pain does not keep her awake.   The patient's right medial leg incision is healing well.    For VQI Use Only  PRE-ADM LIVING: Home  AMB STATUS: Ambulatory   Past Medical History:  Diagnosis Date  . Anxiety   . Arthritis   . Attention to nephrostomy ([place])    PT HAS NEPHROSTOMY TUBE IN PLACE  . Bruises easily   . Cancer [facility])    Skin cancer  . Chronic diarrhea   . Colitis   . COPD (chronic obstructive pulmonary disease) ([place])   . Dementia   . Depression   . GERD (gastroesophageal reflux disease)   . Hiatal hernia   . Hx of bronchitis   . Hx of pulmonary edema JULY 2015  . Hx of pulmonary edema JULY 2015  . Hx of septic shock JULY 2015  . Hydronephrosis, left   . Hyperlipidemia   . Memory difficulties   . Microscopic colitis   .  Myocardial infarction    unknown time   . Renal insufficiency   . Seizures ([place])    last seizure was 4-5 years ago with "brain bleed"  . Shortness of breath    with exertion  . Stroke [facility])    TIA four years ago / stroke NOV 2014    Past Surgical History:  Procedure Laterality Date  . APPENDECTOMY    . BREAST BIOPSY Right 02/02/2014   Procedure: RIGHT BREAST BIOPSY;  Surgeon: Jamesetta So, MD;  Location: [facility] ORS;  Service: General;  Laterality: Right;  . COLONOSCOPY N/A 05/24/2013   Procedure: COLONOSCOPY;  Surgeon: Rogene Houston, MD;  Location: [facility] ENDO SUITE;  Service: Endoscopy;  Laterality: N/A;  100  . CYSTOSCOPY W/ URETERAL STENT PLACEMENT Left 08/06/2015   Procedure: CYSTOSCOPY WITH LEFT URETERAL STENT EXCHANGE - Sammie Bench;  Surgeon: Franchot Gallo, MD;  Location: [facility] ORS;  Service: Urology;  Laterality: Left;  . CYSTOSCOPY WITH RETROGRADE PYELOGRAM, URETEROSCOPY AND STENT PLACEMENT Left 09/18/2013   Procedure: CYSTOSCOPY WITH RETROGRADE PYELOGRAM,  URETEROSCOPY AND  STENT PLACEMENT, removal of nephrostomy tube;  Surgeon: Jorja Loa, MD;  Location: [facility] ORS;  Service: Urology;  Laterality: Left;  . CYSTOSCOPY WITH STENT PLACEMENT Left 09/04/2014  Procedure: CYSTOSCOPY WITH LEFT J2 STENT EXCHANGE;  Surgeon: Franchot Gallo, MD;  Location: [facility] ORS;  Service: Urology;  Laterality: Left;  . ENDOVASCULAR REPAIR OF POPLITEAL ARTERY ANEURYSM  02/05/2016  . ESOPHAGOGASTRODUODENOSCOPY N/A 05/24/2013   Procedure: ESOPHAGOGASTRODUODENOSCOPY (EGD);  Surgeon: Rogene Houston, MD;  Location: [facility] ENDO SUITE;  Service: Endoscopy;  Laterality: N/A;  . FALSE ANEURYSM REPAIR Right 02/05/2016   Procedure: REPAIR RIGHT POLITEAL  ANEURYSM;  Surgeon: Serafina Mitchell, MD;  Location: [place];  Service: Vascular;  Laterality: Right;  . KNEE ARTHROSCOPY Right   . SKIN CANCER EXCISION    . TONSILLECTOMY    . TUBAL LIGATION       Social History   Social History  . Marital status: Widowed    Spouse  name: N/A  . Number of children: 6  . Years of education: college   Occupational History  . retired    Social History Main Topics  . Smoking status: Former Smoker    Packs/day: 1.00    Years: 50.00    Types: Cigarettes    Quit date: 11/13/1997  . Smokeless tobacco: Never Used  . Alcohol use 4.2 oz/week    7 Glasses of wine per week     Comment: 2 glasses a night wine (none in a year)  . Drug use: No  . Sexual activity: Not Currently   Other Topics Concern  . Not on file   Social History Narrative   Patient is widowed with 6 children.   Patient is right handed.   Patient has college education.   Patient drinks 4 cups daily.   Family History  Problem Relation Age of Onset  . Diabetes Mother   . Stroke Mother   . Heart attack Father    Reaction to PCN: Throat swelling as a child. Tolerated ceftriaxone 07/29/13 (review of records).   Current Outpatient Prescriptions on File Prior to Visit  Medication Sig Dispense Refill  . albuterol (PROVENTIL HFA;VENTOLIN HFA) 108 (90 BASE) MCG/ACT inhaler Inhale 2 puffs into the lungs every 6 (six) hours as needed for wheezing or shortness of breath. 1 Inhaler 0  . atorvastatin (LIPITOR) 80 MG tablet Take 1 tablet (80 mg total) by mouth daily at 6 PM. (Patient taking differently: Take 40 mg by mouth daily. TAKES 0.5 TABLET) 30 tablet 0  . budesonide (ENTOCORT EC) 3 MG 24 hr capsule Take 6 mg by mouth daily as needed (diarrhea due to colitis.).     Marland Kitchen budesonide-formoterol (SYMBICORT) 160-4.5 MCG/ACT inhaler Inhale 2 puffs into the lungs 2 (two) times daily. 1 Inhaler 0  . citalopram (CELEXA) 10 MG tablet TAKE 1 TABLET BY MOUTH DAILY 90 tablet 0  . feeding supplement, ENSURE ENLIVE, (ENSURE ENLIVE) LIQD Take 237 mLs by mouth 2 (two) times daily between meals. 237 mL 12  . Ferrous Sulfate 27 MG TABS Take 27 mg by mouth daily.    Marland Kitchen levETIRAcetam (KEPPRA) 250 MG tablet Take 1 tablet (250 mg total) by mouth 2 (two) times daily. 60 tablet 11  .  memantine (NAMENDA) 10 MG tablet Take 1 tablet (10 mg total) by mouth 2 (two) times daily. 60 tablet 11  . Methylfol-Algae-B12-Acetylcyst (CEREFOLIN NAC) 6-90.314-2-600 MG TABS Take 1 tablet by mouth daily. 90 tablet 3  . pantoprazole (PROTONIX) 40 MG tablet TAKE ONE TABLET ONCE DAILY WITH BREAKFAST 30 tablet 5  . Potassium 99 MG TABS Take 99 mg by mouth daily.    . rivaroxaban (XARELTO) 20 MG TABS tablet  Take 1 tablet (20 mg total) by mouth daily with supper. 30 tablet 2  . SPIRIVA HANDIHALER 18 MCG inhalation capsule Place 1 capsule into inhaler and inhale daily.  1  . VESICARE 10 MG tablet Take 5 mg by mouth daily. TAKES 0.5 TABLET    . Amino Acids-Protein Hydrolys (FEEDING SUPPLEMENT, PRO-STAT SUGAR FREE 64,) LIQD Take 30 mLs by mouth 2 (two) times daily. (Patient not taking: Reported on 01/20/2016) 900 mL 0  . loperamide (IMODIUM A-D) 2 MG tablet Take 2 mg by mouth 4 (four) times daily as needed for diarrhea or loose stools.    . magnesium oxide (MAG-OX) 400 (241.3 Mg) MG tablet Take 1 tablet (400 mg total) by mouth 2 (two) times daily. (Patient not taking: Reported on 01/20/2016) 30 tablet 0  . naproxen sodium (ANAPROX) 220 MG tablet Take 440 mg by mouth daily as needed (HEADACHE).     No current facility-administered medications on file prior to visit.     Physical Examination  Vitals:   02/18/16 1127  BP: 100/68  Pulse: 89  Resp: 14  Temp: 98.2 F (36.8 C)  SpO2: 99%  Weight: 87 lb (39.5 kg)  Height: 5\' 1"  (1.549 m)   Body mass index is 16.44 kg/m.  Right leg incision that is medial to knee is healing well, incision edges are well proximated, no erythema, no drainage. There is what appears to be a hematoma or seroma at the posterior aspect of the incision, measures about 4 cm x 8 cm.  Bilateral popliteal and pedal pulses are palpable.   Medical Decision Making  Bridget Ball is a 83 y.o. year old female who presents s/p primary repair of right popliteal artery aneurysm on  02-05-16. She is taking Xarelto which increases the chance of a hematoma forming. She is also receiving home physical therapy.  Seroma rf hematoma dependent to incision at medial aspect right leg.  Daughter states that pt is able to sleep well at night, but has c/o pain, taking Tylenol #3, daughter states is not helping the pain at the incision medial aspect right knee. Daughter asked for a prescription medication to help her mother's pain, was going to prescribe tramadol but it is contraindicated in the elderly with a history of seizures; pt has a history of seizures. Daughter states she will try giving her mother no more than 750 mg Tylenol every 8 hours for pain since Tylenol (300 mg) with codeine (30 mg) is not helping.    Follow up as already scheduled with Dr. Trula Slade on 03-02-16.  I advised daughter to notify us sooner for further concerns.   Thank you for allowing [place] to participate in this patient's care.  Kaira Stringfield, Sharmon Leyden, RN, MSN, FNP-C [facility] [place] Office: [phone]  02/18/2016, 12:32 PM  Clinic MD: Early

## 2016-02-20 DIAGNOSIS — M79606 Pain in leg, unspecified: Secondary | ICD-10-CM | POA: Diagnosis not present

## 2016-02-20 DIAGNOSIS — J449 Chronic obstructive pulmonary disease, unspecified: Secondary | ICD-10-CM | POA: Diagnosis not present

## 2016-02-20 DIAGNOSIS — I724 Aneurysm of artery of lower extremity: Secondary | ICD-10-CM | POA: Diagnosis not present

## 2016-02-20 DIAGNOSIS — R531 Weakness: Secondary | ICD-10-CM | POA: Diagnosis not present

## 2016-02-24 DIAGNOSIS — R531 Weakness: Secondary | ICD-10-CM | POA: Diagnosis not present

## 2016-02-24 DIAGNOSIS — M79606 Pain in leg, unspecified: Secondary | ICD-10-CM | POA: Diagnosis not present

## 2016-02-24 DIAGNOSIS — J449 Chronic obstructive pulmonary disease, unspecified: Secondary | ICD-10-CM | POA: Diagnosis not present

## 2016-02-24 DIAGNOSIS — I724 Aneurysm of artery of lower extremity: Secondary | ICD-10-CM | POA: Diagnosis not present

## 2016-02-25 ENCOUNTER — Encounter: Payer: Self-pay | Admitting: Surgery

## 2016-03-02 ENCOUNTER — Encounter: Payer: Self-pay | Admitting: Surgery

## 2016-03-02 ENCOUNTER — Ambulatory Visit (INDEPENDENT_AMBULATORY_CARE_PROVIDER_SITE_OTHER): Payer: Self-pay | Admitting: Surgery

## 2016-03-02 VITALS — BP 113/71 | HR 72 | Temp 97.7°F | Resp 16 | Ht 61.0 in | Wt 94.1 lb

## 2016-03-02 DIAGNOSIS — I724 Aneurysm of artery of lower extremity: Secondary | ICD-10-CM

## 2016-03-02 NOTE — Progress Notes (Signed)
Patient name: Bridget Ball MRN: [id number] DOB: 09/20/1928 Sex: female  REASON FOR VISIT:     post-op  HISTORY OF PRESENT ILLNESS:   Bridget Ball is a 83 y.o. female in the hospital where there was concern for possible necrotizing fasciitis.  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  I felt this was the source of her newly diagnosed DVT and subsequent cellulitis.  She got better with IV heparin and IV antibiotics and was ultimately discharged home on Xaralto.  On 02/05/2016 she underwent primary repair of her right popliteal aneurysm.  I was able to do this by resecting the aneurysm and mobilizing the edges to perform a primary end to end anastomosis.  The patient's done well postoperatively.  She remains on Xaralto.  She was seen in the office for concerns of her pain and swelling in the leg.  The [place] soft.  She does not endorse much swelling  CURRENT MEDICATIONS:    Current Outpatient Prescriptions  Medication Sig Dispense Refill  . albuterol (PROVENTIL HFA;VENTOLIN HFA) 108 (90 BASE) MCG/ACT inhaler Inhale 2 puffs into the lungs every 6 (six) hours as needed for wheezing or shortness of breath. 1 Inhaler 0  . Amino Acids-Protein Hydrolys (FEEDING SUPPLEMENT, PRO-STAT SUGAR FREE 64,) LIQD Take 30 mLs by mouth 2 (two) times daily. 900 mL 0  . atorvastatin (LIPITOR) 80 MG tablet Take 1 tablet (80 mg total) by mouth daily at 6 PM. (Patient taking differently: Take 40 mg by mouth daily. TAKES 0.5 TABLET) 30 tablet 0  . budesonide (ENTOCORT EC) 3 MG 24 hr capsule Take 6 mg by mouth daily as needed (diarrhea due to colitis.).     Marland Kitchen budesonide-formoterol (SYMBICORT) 160-4.5 MCG/ACT inhaler Inhale 2 puffs into the lungs 2 (two) times daily. 1 Inhaler 0  . citalopram (CELEXA) 10 MG tablet TAKE 1 TABLET BY MOUTH DAILY 90 tablet 0  . feeding supplement, ENSURE  ENLIVE, (ENSURE ENLIVE) LIQD Take 237 mLs by mouth 2 (two) times daily between meals. 237 mL 12  . Ferrous Sulfate 27 MG TABS Take 27 mg by mouth daily.    Marland Kitchen levETIRAcetam (KEPPRA) 250 MG tablet Take 1 tablet (250 mg total) by mouth 2 (two) times daily. 60 tablet 11  . loperamide (IMODIUM A-D) 2 MG tablet Take 2 mg by mouth 4 (four) times daily as needed for diarrhea or loose stools.    . magnesium oxide (MAG-OX) 400 (241.3 Mg) MG tablet Take 1 tablet (400 mg total) by mouth 2 (two) times daily. 30 tablet 0  . memantine (NAMENDA) 10 MG tablet Take 1 tablet (10 mg total) by mouth 2 (two) times daily. 60 tablet 11  . Methylfol-Algae-B12-Acetylcyst (CEREFOLIN NAC) 6-90.314-2-600 MG TABS Take 1 tablet by mouth daily. 90 tablet 3  . naproxen sodium (ANAPROX) 220 MG tablet Take 440 mg by mouth daily as needed (HEADACHE).    . pantoprazole (PROTONIX) 40 MG tablet TAKE ONE TABLET ONCE DAILY WITH BREAKFAST 30 tablet 5  . Potassium 99 MG TABS Take 99 mg by mouth daily.    . rivaroxaban (XARELTO) 20 MG TABS tablet Take 1 tablet (20 mg total) by mouth daily with supper. 30 tablet 2  . SPIRIVA HANDIHALER 18 MCG inhalation capsule Place 1 capsule into inhaler and inhale daily.  1  . VESICARE 10 MG tablet Take 5 mg by mouth daily. TAKES 0.5 TABLET  No current facility-administered medications for this visit.     REVIEW OF SYSTEMS:   [X]  denotes positive finding, [ ]  denotes negative finding Cardiac  Comments:  Chest pain or chest pressure:    Shortness of breath upon exertion:    Short of breath when lying flat:    Irregular heart rhythm:    Constitutional    Fever or chills:      PHYSICAL EXAM:   Vitals:   03/02/16 1031  BP: 113/71  Pulse: 72  Resp: 16  Temp: 97.7 F (36.5 C)  TempSrc: Oral  SpO2: 96%  Weight: 94 lb 1.6 oz (42.7 kg)  Height: 5\' 1"  (1.549 m)    GENERAL: The patient is a well-nourished female, in no acute distress. The vital signs are documented above. CARDIOVASCULAR:  There is a regular rate and rhythm. PULMONARY: Non-labored respirations Right dorsalis pedis pulse The incision is healing nicely without evidence of hematoma or ecchymosis  STUDIES:   None   MEDICAL ISSUES:   I'll schedule the patient to follow up in approximate 2 months.  At that time I will get a duplex and ABIs of her popliteal artery to evaluate the repair.  I will also order a right lower extremity venous Doppler study to evaluate the popliteal DVT.  I will most likely discontinue her anticoagulation pending the results of the studies.  [name], MD [facility] [phone] Pager [phone]

## 2016-03-03 DIAGNOSIS — J449 Chronic obstructive pulmonary disease, unspecified: Secondary | ICD-10-CM | POA: Diagnosis not present

## 2016-03-03 DIAGNOSIS — I724 Aneurysm of artery of lower extremity: Secondary | ICD-10-CM | POA: Diagnosis not present

## 2016-03-03 DIAGNOSIS — M79606 Pain in leg, unspecified: Secondary | ICD-10-CM | POA: Diagnosis not present

## 2016-03-03 DIAGNOSIS — R531 Weakness: Secondary | ICD-10-CM | POA: Diagnosis not present

## 2016-03-05 ENCOUNTER — Other Ambulatory Visit: Payer: Self-pay

## 2016-03-05 NOTE — Addendum Note (Signed)
Addended by: Lianne Cure A on: 03/05/2016 02:14 PM   Modules accepted: Orders

## 2016-03-05 NOTE — Addendum Note (Signed)
Addended by: Lianne Cure A on: 03/05/2016 04:03 PM   Modules accepted: Orders

## 2016-03-09 ENCOUNTER — Other Ambulatory Visit: Payer: Self-pay

## 2016-03-09 DIAGNOSIS — I82431 Acute embolism and thrombosis of right popliteal vein: Secondary | ICD-10-CM

## 2016-03-18 DIAGNOSIS — L814 Other melanin hyperpigmentation: Secondary | ICD-10-CM | POA: Diagnosis not present

## 2016-03-18 DIAGNOSIS — Z8679 Personal history of other diseases of the circulatory system: Secondary | ICD-10-CM | POA: Diagnosis not present

## 2016-03-18 DIAGNOSIS — I679 Cerebrovascular disease, unspecified: Secondary | ICD-10-CM | POA: Diagnosis not present

## 2016-03-18 DIAGNOSIS — L570 Actinic keratosis: Secondary | ICD-10-CM | POA: Diagnosis not present

## 2016-03-18 DIAGNOSIS — D1801 Hemangioma of skin and subcutaneous tissue: Secondary | ICD-10-CM | POA: Diagnosis not present

## 2016-03-18 DIAGNOSIS — L539 Erythematous condition, unspecified: Secondary | ICD-10-CM | POA: Diagnosis not present

## 2016-03-18 DIAGNOSIS — L821 Other seborrheic keratosis: Secondary | ICD-10-CM | POA: Diagnosis not present

## 2016-03-18 DIAGNOSIS — Z85828 Personal history of other malignant neoplasm of skin: Secondary | ICD-10-CM | POA: Diagnosis not present

## 2016-03-18 DIAGNOSIS — Z86718 Personal history of other venous thrombosis and embolism: Secondary | ICD-10-CM | POA: Diagnosis not present

## 2016-03-31 ENCOUNTER — Encounter: Payer: Self-pay | Admitting: Neurology

## 2016-03-31 ENCOUNTER — Encounter (INDEPENDENT_AMBULATORY_CARE_PROVIDER_SITE_OTHER): Payer: Self-pay

## 2016-03-31 ENCOUNTER — Ambulatory Visit (INDEPENDENT_AMBULATORY_CARE_PROVIDER_SITE_OTHER): Payer: Medicare Other | Admitting: Neurology

## 2016-03-31 VITALS — BP 106/61 | HR 82 | Ht 61.0 in | Wt 93.2 lb

## 2016-03-31 DIAGNOSIS — G301 Alzheimer's disease with late onset: Secondary | ICD-10-CM | POA: Diagnosis not present

## 2016-03-31 DIAGNOSIS — F0280 Dementia in other diseases classified elsewhere without behavioral disturbance: Secondary | ICD-10-CM

## 2016-03-31 MED ORDER — MEMANTINE HCL ER 7 MG PO CP24: 28.0000 mg | ORAL_CAPSULE | Freq: Every day | ORAL | Status: DC

## 2016-03-31 NOTE — Patient Instructions (Signed)
I had a long discussion the patient and her daughter regarding her dementia which appears more or less stable but recommend increasing dose of Namenda XR 28 mg daily if tolerated and affordable. Continue Xarelto for stroke prevention. Advise her to follow-up with primary care physician to have lipid profile checked. She will return for follow-up in 6 months or call earlier if necessary

## 2016-03-31 NOTE — Progress Notes (Signed)
PATIENT: Bridget Ball DOB: 1928-05-20  REASON FOR VISIT: follow up- memory impairment, seizures HISTORY FROM: patient  HISTORY OF PRESENT ILLNESS: Today 09/26/2015: Bridget Ball is an 83 year old female with a history of memory impairment and seizures. She returns today for follow-up. She reports that she did start Namenda however she lost her medication midway through her titration. She did not try to restart this. She states that her memory has remained stable. She is able to complete all ADLs independently. She doesn't operate a motor vehicle but only on [place] not very busy. She continues on Keppra for seizures. She has not had any seizure events. She returns today for an evaluation.  Update: 06/24/2015: Bridget Ball is an 83 year old female with a history of subarachnoid hemorrhage in 2014, seizures and memory impairment. She returns today for an evaluation. Her family reports that her memory has recently started to decline. They've noticed that she is more repetitive. She is able to complete all ADLs independently. She typically lives at home alone however her daughter recently had neck surgery since she moved in with her temporarily. The patient also has a history of a kidney stent. She reports it is malfunctioning and will need to be replaced. She is also been having bladder retention which caused a urinary tract infection. She just finished with her antibiotic. She is currently taking Cerefolin. However her family is interested in trying a different medication since they have noticed a decline in her memory. She continues on Keppra for seizure prevention. She's not had any additional seizure events. She returns today for an evaluation.    HISTORY ([place]): Initial Consult 01/24/2013 ; 44 year Caucasian lady seen for followup following hospital admission on 11/19/12 for subarachnoid hemorrhage and seizures. She presented with recurrent transient episodes of left body paresthesias and  difficulty speaking. The episode began in the left arm and gradually over minutes progressed to involve the face and lasted several minutes only. At times she was unable to form words and speak but she could understand what other people were saying. She described this as a feeling of pins and needles but without any impairment of consciousness. She had similar episodes in 2010 when she had a similar localized right frontal convexity subarachnoid hemorrhage. She had had no head trauma or significant headaches or had bumped her head. And at times MRI scan had shown similar findings to the current one which showed localized blood in the right frontal convexity with some diffusion positive changes suggesting microinfarction adjacent. She previously has cerebral catheter angiogram performed which showed no evidence of underlying AVM, aneurysms or vascular abnormalities. She was started on Neurontin for seizures and did well for a while but then gradually decided to stop the medication she did follow up with me for 1 year but subsequently was lost to followup. She had EEG during the previous admission and 2010 which did not show definite seizure activity.She had a repeat EEG on 11/21/12 which was normal. CT angiogram of the brain on 11/16/14and showed no evidence of underlying aneurysm or vascular abnormality. Urine drug screen was negative. Patient was started on Keppra initially 500 twice a day subsequent when episodes persisted was increased to thousand twice a day and Depakote ER was added. Patient states she's did well since discharge and has not had recurrent episodes of numbness however she has had confusion and agitation issues and hence has stopped the Depakote and in fact has even reduce the Keppra to 500 twice daily. Patient complains  of short-term problems as well as intermittent confusion, balance issues. She forgets how to use a credit card and daughter has to help her in on several occasions. She previously  had home so [place] which appear to be healing now and she does see a home care nurse twice a week. She hasn't never been diagnosed in the past with depression or been on any medication for that. She denies headache, focal extremity weakness, numbness or vision problems.  Update 03/31/2016 : She returns for follow-up after last visit to 6 months ago. He is recumbent in by daughter. She is tolerated Namenda 10 mg twice daily without side effects. Dr. Oneida Alar memory difficulties about the same and at times she feels she may be slightly worse. Patient has had a few episodes of transient left face and hand numbness lasting about 10 minutes. She had 2 or 3 of these episodes several months ago but not recently. She remains on Xarelto which is taking every day. She states her blood pressure is well controlled. She had lipid profile checked by primary physician but cannot to me when but it was apparently satisfactory. She's had no recent seizures. She remains on Keppra 500 twice daily and started eating well without any side effects.  REVIEW OF SYSTEMS: Out of a complete 14 system review of symptoms, the patient complains only of the following symptoms, and all other reviewed systems are negative. Fatigue, shortness of breath, murmur, numbness, memory loss, easy bruising and all other systems negative ALLERGIES: Allergies  Allergen Reactions  . Penicillins Anaphylaxis    Throat swelling as a child. Tolerated ceftriaxone 07/29/13.     HOME MEDICATIONS: Outpatient Medications Prior to Visit  Medication Sig Dispense Refill  . albuterol (PROVENTIL HFA;VENTOLIN HFA) 108 (90 BASE) MCG/ACT inhaler Inhale 2 puffs into the lungs every 6 (six) hours as needed for wheezing or shortness of breath. 1 Inhaler 0  . Amino Acids-Protein Hydrolys (FEEDING SUPPLEMENT, PRO-STAT SUGAR FREE 64,) LIQD Take 30 mLs by mouth 2 (two) times daily. 900 mL 0  . atorvastatin (LIPITOR) 80 MG tablet Take 1 tablet (80 mg total) by mouth daily  at 6 PM. (Patient taking differently: Take 40 mg by mouth daily. TAKES 0.5 TABLET) 30 tablet 0  . budesonide (ENTOCORT EC) 3 MG 24 hr capsule Take 6 mg by mouth daily as needed (diarrhea due to colitis.).     Marland Kitchen budesonide-formoterol (SYMBICORT) 160-4.5 MCG/ACT inhaler Inhale 2 puffs into the lungs 2 (two) times daily. 1 Inhaler 0  . citalopram (CELEXA) 10 MG tablet TAKE 1 TABLET BY MOUTH DAILY 90 tablet 0  . feeding supplement, ENSURE ENLIVE, (ENSURE ENLIVE) LIQD Take 237 mLs by mouth 2 (two) times daily between meals. 237 mL 12  . Ferrous Sulfate 27 MG TABS Take 27 mg by mouth daily.    Marland Kitchen levETIRAcetam (KEPPRA) 250 MG tablet Take 1 tablet (250 mg total) by mouth 2 (two) times daily. 60 tablet 11  . loperamide (IMODIUM A-D) 2 MG tablet Take 2 mg by mouth 4 (four) times daily as needed for diarrhea or loose stools.    . magnesium oxide (MAG-OX) 400 (241.3 Mg) MG tablet Take 1 tablet (400 mg total) by mouth 2 (two) times daily. 30 tablet 0  . Methylfol-Algae-B12-Acetylcyst (CEREFOLIN NAC) 6-90.314-2-600 MG TABS Take 1 tablet by mouth daily. 90 tablet 3  . naproxen sodium (ANAPROX) 220 MG tablet Take 440 mg by mouth daily as needed (HEADACHE).    . pantoprazole (PROTONIX) 40 MG tablet TAKE  ONE TABLET ONCE DAILY WITH BREAKFAST 30 tablet 5  . Potassium 99 MG TABS Take 99 mg by mouth daily.    . rivaroxaban (XARELTO) 20 MG TABS tablet Take 1 tablet (20 mg total) by mouth daily with supper. 30 tablet 2  . SPIRIVA HANDIHALER 18 MCG inhalation capsule Place 1 capsule into inhaler and inhale daily.  1  . VESICARE 10 MG tablet Take 5 mg by mouth daily. TAKES 0.5 TABLET    . memantine (NAMENDA) 10 MG tablet Take 1 tablet (10 mg total) by mouth 2 (two) times daily. 60 tablet 11   No facility-administered medications prior to visit.     PAST MEDICAL HISTORY: Past Medical History:  Diagnosis Date  . Anxiety   . Arthritis   . Attention to nephrostomy ([place])    PT HAS NEPHROSTOMY TUBE IN PLACE  . Bruises  easily   . Cancer [facility])    Skin cancer  . Chronic diarrhea   . Colitis   . COPD (chronic obstructive pulmonary disease) ([place])   . Dementia   . Depression   . GERD (gastroesophageal reflux disease)   . Hiatal hernia   . Hx of bronchitis   . Hx of pulmonary edema JULY 2015  . Hx of pulmonary edema JULY 2015  . Hx of septic shock JULY 2015  . Hydronephrosis, left   . Hyperlipidemia   . Memory difficulties   . Microscopic colitis   . Myocardial infarction    unknown time   . Renal insufficiency   . Seizures ([place])    last seizure was 4-5 years ago with "brain bleed"  . Shortness of breath    with exertion  . Stroke [facility])    TIA four years ago / stroke NOV 2014    PAST SURGICAL HISTORY: Past Surgical History:  Procedure Laterality Date  . APPENDECTOMY    . BREAST BIOPSY Right 02/02/2014   Procedure: RIGHT BREAST BIOPSY;  Surgeon: Jamesetta So, MD;  Location: [facility] ORS;  Service: General;  Laterality: Right;  . COLONOSCOPY N/A 05/24/2013   Procedure: COLONOSCOPY;  Surgeon: Rogene Houston, MD;  Location: [facility] ENDO SUITE;  Service: Endoscopy;  Laterality: N/A;  100  . CYSTOSCOPY W/ URETERAL STENT PLACEMENT Left 08/06/2015   Procedure: CYSTOSCOPY WITH LEFT URETERAL STENT EXCHANGE - Sammie Bench;  Surgeon: Franchot Gallo, MD;  Location: [facility] ORS;  Service: Urology;  Laterality: Left;  . CYSTOSCOPY WITH RETROGRADE PYELOGRAM, URETEROSCOPY AND STENT PLACEMENT Left 09/18/2013   Procedure: CYSTOSCOPY WITH RETROGRADE PYELOGRAM,  URETEROSCOPY AND  STENT PLACEMENT, removal of nephrostomy tube;  Surgeon: Jorja Loa, MD;  Location: [facility] OR[facility];  Service: Urology;  Laterality: Left;  . CYSTOSCOPY WITH STENT PLACEMENT Left 09/04/2014   Procedure: CYSTOSCOPY WITH LEFT J2 STENT EXCHANGE;  Surgeon: Franchot Gallo, MD;  Location: [facility] OR[facility];  Service: Urology;  Laterality: Left;  . ENDOVASCULAR REPAIR OF POPLITEAL ARTERY ANEURYSM  02/05/2016  . ESOPHAGOGASTRODUODENOSCOPY N/A 05/24/2013   Procedure:  ESOPHAGOGASTRODUODENOSCOPY (EGD);  Surgeon: Rogene Houston, MD;  Location: [facility];  Service: Endoscopy;  Laterality: N/A;  . FALSE ANEURYSM REPAIR Right 02/05/2016   Procedure: REPAIR RIGHT POLITEAL  ANEURYSM;  Surgeon: Serafina Mitchell, MD;  Location: [facility];  Service: Vascular;  Laterality: Right;  . KNEE ARTHROSCOPY Right   . SKIN CANCER EXCISION    . TONSILLECTOMY    . TUBAL LIGATION      FAMILY HISTORY: Family History  Problem Relation Age of Onset  . Diabetes  Mother   . Stroke Mother   . Heart attack Father     SOCIAL HISTORY: Social History   Social History  . Marital status: Widowed    Spouse name: N/A  . Number of children: 6  . Years of education: college   Occupational History  . retired    Social History Main Topics  . Smoking status: Former Smoker    Packs/day: 1.00    Years: 50.00    Types: Cigarettes    Quit date: 11/13/1997  . Smokeless tobacco: Never Used  . Alcohol use 4.2 oz/week    7 Glasses of wine per week     Comment: 2 glasses a night wine (none in a year)  . Drug use: No  . Sexual activity: Not Currently   Other Topics Concern  . Not on file   Social History Narrative   Patient is widowed with 6 children.   Patient is right handed.   Patient has college education.   Patient drinks 4 cups daily.      PHYSICAL EXAM  Vitals:   03/31/16 1645  BP: 106/61  Pulse: 82  Weight: 93 lb 3.2 oz (42.3 kg)  Height: 5\' 1"  (1.549 m)   Body mass index is 17.61 kg/m. MMSE - Mini Mental State Exam 03/31/2016 09/26/2015 06/24/2015  Orientation to time 2 4 2   Orientation to Place 5 3 5   Registration 3 3 3   Attention/ Calculation 5 5 3   Recall 0 1 1  Language- name 2 objects 2 2 2   Language- repeat 1 1 1   Language- follow 3 step command 3 3 3   Language- read & follow direction 1 1 1   Write a sentence 1 1 1   Copy design 0 1 1  Total score 23 25 23     Generalized: Frail elderly Caucasian lady in no acute distress   . Afebrile. Head is  nontraumatic. Neck is supple without bruit.    Cardiac exam no murmur or gallop. Lungs are clear to auscultation. Distal pulses are well felt. Neurological examination  Mentation: Alert. Follows all commands speech and language fluent MMSE 23/30 today.Clock drawing 2/4. Able to name 14 4 legged animals Cranial nerve II-XII: Pupils were equal round reactive to light. Extraocular movements were full, visual field were full on confrontational test. Facial sensation and strength were normal. Uvula tongue midline. Head turning and shoulder shrug  were normal and symmetric. Motor: The motor testing reveals 5 over 5 strength of all 4 extremities. Good symmetric motor tone is noted throughout.  Sensory: Sensory testing is intact to soft touch on all 4 extremities. No evidence of extinction is noted.  Coordination: Cerebellar testing reveals good finger-nose-finger and heel-to-shin bilaterally.  Gait and station: Gait is normal.  Reflexes: Deep tendon reflexes are symmetric and normal bilaterally.   DIAGNOSTIC DATA (LABS, IMAGING, TESTING) - I reviewed patient records, labs, notes, testing and imaging myself where available.  Lab Results  Component Value Date   WBC 5.5 02/06/2016   HGB 9.2 (L) 02/06/2016   HCT 29.0 (L) 02/06/2016   MCV 98.3 02/06/2016   PLT 188 02/06/2016      Component Value Date/Time   NA 142 02/06/2016 0411   NA 142 06/24/2015 1425   K 3.5 02/06/2016 0411   CL 112 (H) 02/06/2016 0411   CO2 24 02/06/2016 0411   GLUCOSE 79 02/06/2016 0411   BUN 5 (L) 02/06/2016 0411   BUN 16 06/24/2015 1425   CREATININE 0.78 02/06/2016 0411  CALCIUM 8.1 (L) 02/06/2016 0411   PROT 6.2 (L) 01/30/2016 1522   PROT 6.2 06/24/2015 1425   ALBUMIN 3.8 01/30/2016 1522   ALBUMIN 4.0 06/24/2015 1425   AST 22 01/30/2016 1522   ALT 19 01/30/2016 1522   ALKPHOS 52 01/30/2016 1522   BILITOT 0.4 01/30/2016 1522   BILITOT 0.3 06/24/2015 1425   GFRNONAA >60 02/06/2016 0411   GFRAA >60 02/06/2016  0411         ASSESSMENT AND PLAN 83 y.o. year old female  has a past medical history of Anxiety; Arthritis; Attention to nephrostomy [facility]); Bruises easily; Cancer ([place]); Chronic diarrhea; Colitis; COPD (chronic obstructive pulmonary disease) ([place]); Dementia; Depression; GERD (gastroesophageal reflux disease); Hiatal hernia; bronchitis; pulmonary edema (JULY 2015); pulmonary edema (JULY 2015); septic shock (JULY 2015); Hydronephrosis, left; Hyperlipidemia; Memory difficulties; Microscopic colitis; Myocardial infarction; Renal insufficiency; Seizures ([place]); Shortness of breath; and Stroke ([place]). here with:  1. Seizures 2. Dementia I had a long discussion the patient and her daughter regarding her dementia which appears more or less stable but recommend increasing dose of Namenda XR 28 mg daily if tolerated and affordable. Continue Xarelto for stroke prevention. Advise her to follow-up with primary care physician to have lipid profile checked. She will return for follow-up in 6 months or call earlier if necessaryGreater than 50% time during this 25 minute visit was spent on counseling and coordination of care about TIAs, seizures and dementia and answering questions.   Antony Contras, MD 03/31/2016, 5:50 PM [facility] [address] [place], [place] [address] [phone]

## 2016-04-06 ENCOUNTER — Other Ambulatory Visit: Payer: Self-pay | Admitting: *Deleted

## 2016-04-06 MED ORDER — MEMANTINE HCL ER 28 MG PO CP24: 28.0000 mg | ORAL_CAPSULE | Freq: Every day | ORAL | 3 refills | Status: DC

## 2016-04-06 NOTE — Telephone Encounter (Signed)
New prescription to [facility] for memantine ER 28mg  po once daily #90 3 refills.

## 2016-04-07 ENCOUNTER — Encounter (INDEPENDENT_AMBULATORY_CARE_PROVIDER_SITE_OTHER): Payer: Self-pay | Admitting: Internal Medicine

## 2016-04-07 ENCOUNTER — Ambulatory Visit (INDEPENDENT_AMBULATORY_CARE_PROVIDER_SITE_OTHER): Payer: Medicare Other | Admitting: Internal Medicine

## 2016-04-07 VITALS — BP 100/62 | HR 60 | Temp 97.3°F | Resp 18 | Ht 61.0 in | Wt 93.7 lb

## 2016-04-07 DIAGNOSIS — K2100 Gastro-esophageal reflux disease with esophagitis, without bleeding: Secondary | ICD-10-CM

## 2016-04-07 DIAGNOSIS — R197 Diarrhea, unspecified: Secondary | ICD-10-CM

## 2016-04-07 NOTE — Progress Notes (Addendum)
Presenting complaint;  Follow-up for chronic diarrhea and weight loss.  Subjective:  Patient is 83 year old Caucasian female was history of chronic diarrhea secondary to lymphocytic colitis as well as weight loss in addition to multiple other problems. She was last seen on 10/01/2015. She is here for scheduled visit by herself. She states she has very good appetite. She is not sure why she is not gaining weight. She denies nausea vomiting abdominal pain or diarrhea. She is having 1-2 formed stools per day. She says she is drinking 2 cans of ensure every day and she has not experienced bloating or diarrhea. She is not sure if she is still taking magnesium which is listed as one of her medications.   Current Medications: Outpatient Encounter Prescriptions as of 04/07/2016  Medication Sig  . albuterol (PROVENTIL HFA;VENTOLIN HFA) 108 (90 BASE) MCG/ACT inhaler Inhale 2 puffs into the lungs every 6 (six) hours as needed for wheezing or shortness of breath.  Marland Kitchen atorvastatin (LIPITOR) 80 MG tablet Take 1 tablet (80 mg total) by mouth daily at 6 PM. (Patient taking differently: Take 40 mg by mouth daily. TAKES 0.5 TABLET)  . budesonide (ENTOCORT EC) 3 MG 24 hr capsule Take 6 mg by mouth daily as needed (diarrhea due to colitis.).   Marland Kitchen budesonide-formoterol (SYMBICORT) 160-4.5 MCG/ACT inhaler Inhale 2 puffs into the lungs 2 (two) times daily.  . citalopram (CELEXA) 10 MG tablet TAKE 1 TABLET BY MOUTH DAILY  . feeding supplement, ENSURE ENLIVE, (ENSURE ENLIVE) LIQD Take 237 mLs by mouth 2 (two) times daily between meals.  . Ferrous Sulfate 27 MG TABS Take 27 mg by mouth daily.  Marland Kitchen levETIRAcetam (KEPPRA) 250 MG tablet Take 1 tablet (250 mg total) by mouth 2 (two) times daily.  Marland Kitchen loperamide (IMODIUM A-D) 2 MG tablet Take 2 mg by mouth 4 (four) times daily as needed for diarrhea or loose stools.  . magnesium oxide (MAG-OX) 400 (241.3 Mg) MG tablet Take 1 tablet (400 mg total) by mouth 2 (two) times daily.  .  memantine (NAMENDA XR) 28 MG CP24 24 hr capsule Take 1 capsule (28 mg total) by mouth daily.  . Methylfol-Algae-B12-Acetylcyst (CEREFOLIN NAC) 6-90.314-2-600 MG TABS Take 1 tablet by mouth daily.  . naproxen sodium (ANAPROX) 220 MG tablet Take 440 mg by mouth daily as needed (HEADACHE).  . pantoprazole (PROTONIX) 40 MG tablet TAKE ONE TABLET ONCE DAILY WITH BREAKFAST  . Potassium 99 MG TABS Take 99 mg by mouth daily.  . rivaroxaban (XARELTO) 20 MG TABS tablet Take 1 tablet (20 mg total) by mouth daily with supper.  Marland Kitchen SPIRIVA HANDIHALER 18 MCG inhalation capsule Place 1 capsule into inhaler and inhale daily.  . VESICARE 10 MG tablet Take 5 mg by mouth daily. TAKES 0.5 TABLET  . [DISCONTINUED] Amino Acids-Protein Hydrolys (FEEDING SUPPLEMENT, PRO-STAT SUGAR FREE 64,) LIQD Take 30 mLs by mouth 2 (two) times daily. (Patient not taking: Reported on 04/07/2016)  . [DISCONTINUED] memantine (NAMENDA) 10 MG tablet    No facility-administered encounter medications on file as of 04/07/2016.      Objective: Blood pressure 100/62, pulse 60, temperature 97.3 F (36.3 C), temperature source Oral, resp. rate 18, height 5\' 1"  (1.549 m), weight 93 lb 11.2 oz (42.5 kg). Patient is alert and in no acute distress. She is in good mood. Conjunctiva is pink. Sclera is nonicteric Oropharyngeal mucosa is normal. She has partial upper and lower dental plates. No neck masses or thyromegaly noted. Cardiac exam with regular rhythm normal S1  and S2. No murmur or gallop noted. Lungs are clear to auscultation. Abdomen is flat. Bowel sounds are normal. On palpation abdomen is soft and nontender without organomegaly or masses. No LE edema or clubbing noted but extremities are thin.   Assessment:  #1.Chronic diarrhea secondary to lymphocytic colitis. Patient is having formed stools daily. She has not taken Imodium in the past few weeks. Therefore she appears to be in remission. #2. Chronic GERD. Symptoms well controlled  with PPI. #3. Weight loss. Weight loss appears to be multifactorial. She seems to have leveled off. #4. History of hypomagnesemia. This was discovered when she was hospitalized with acute illness. I do not believe she needs to continue magnesium indefinitely.   Plan:  Discontinue by mouth magnesium. Will check serum magnesium level in 2 weeks. Patient advised to keep Aleve/Naprosyn use to minimum. She is on Xarelto and therefore at increased risk for GI bleed. Office visit in 6 months.

## 2016-04-07 NOTE — Patient Instructions (Addendum)
Will check serum magnesium in 2 weeks. Keep Aleve or Naprosyn use to minimum.

## 2016-04-16 ENCOUNTER — Encounter: Payer: Self-pay | Admitting: Surgery

## 2016-04-20 DIAGNOSIS — M542 Cervicalgia: Secondary | ICD-10-CM | POA: Diagnosis not present

## 2016-04-20 DIAGNOSIS — I679 Cerebrovascular disease, unspecified: Secondary | ICD-10-CM | POA: Diagnosis not present

## 2016-04-20 DIAGNOSIS — Z8679 Personal history of other diseases of the circulatory system: Secondary | ICD-10-CM | POA: Diagnosis not present

## 2016-04-20 DIAGNOSIS — Z86718 Personal history of other venous thrombosis and embolism: Secondary | ICD-10-CM | POA: Diagnosis not present

## 2016-04-25 ENCOUNTER — Other Ambulatory Visit: Payer: Self-pay | Admitting: Adult Health

## 2016-04-27 ENCOUNTER — Ambulatory Visit (INDEPENDENT_AMBULATORY_CARE_PROVIDER_SITE_OTHER): Payer: Self-pay | Admitting: Surgery

## 2016-04-27 ENCOUNTER — Encounter: Payer: Self-pay | Admitting: Surgery

## 2016-04-27 ENCOUNTER — Ambulatory Visit (HOSPITAL_COMMUNITY)
Admission: RE | Admit: 2016-04-27 | Discharge: 2016-04-27 | Disposition: A | Discharge status: Home / Self Care | Payer: Medicare Other | Source: Ambulatory Visit | Attending: Surgery | Admitting: Surgery

## 2016-04-27 VITALS — BP 124/80 | HR 76 | Temp 97.1°F | Resp 18 | Ht 61.0 in | Wt 93.0 lb

## 2016-04-27 DIAGNOSIS — I724 Aneurysm of artery of lower extremity: Secondary | ICD-10-CM

## 2016-04-27 DIAGNOSIS — I82431 Acute embolism and thrombosis of right popliteal vein: Secondary | ICD-10-CM | POA: Diagnosis not present

## 2016-04-27 NOTE — Progress Notes (Signed)
Patient name: Bridget Ball MRN: [id number] DOB: 26-Jul-1928 Sex: female  REASON FOR VISIT:     follow up  HISTORY OF PRESENT ILLNESS:   ZAKIYA SPORRER is a 83 y.o. female in the hospital where there was concern for possible necrotizing fasciitis.  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  I felt this was the source of her newly diagnosed DVT and subsequent cellulitis.  She got better with IV heparin and IV antibiotics and was ultimately discharged home on Xaralto.  On 02/05/2016 she underwent primary repair of her right popliteal aneurysm.  I was able to do this by resecting the aneurysm and mobilizing the edges to perform a primary end to end anastomosis.    She has no complaints today.  CURRENT MEDICATIONS:    Current Outpatient Prescriptions  Medication Sig Dispense Refill  . albuterol (PROVENTIL HFA;VENTOLIN HFA) 108 (90 BASE) MCG/ACT inhaler Inhale 2 puffs into the lungs every 6 (six) hours as needed for wheezing or shortness of breath. 1 Inhaler 0  . atorvastatin (LIPITOR) 80 MG tablet Take 1 tablet (80 mg total) by mouth daily at 6 PM. (Patient taking differently: Take 40 mg by mouth daily. TAKES 0.5 TABLET) 30 tablet 0  . budesonide (ENTOCORT EC) 3 MG 24 hr capsule Take 6 mg by mouth daily as needed (diarrhea due to colitis.).     Marland Kitchen budesonide-formoterol (SYMBICORT) 160-4.5 MCG/ACT inhaler Inhale 2 puffs into the lungs 2 (two) times daily. 1 Inhaler 0  . citalopram (CELEXA) 10 MG tablet TAKE 1 TABLET BY MOUTH DAILY 90 tablet 0  . feeding supplement, ENSURE ENLIVE, (ENSURE ENLIVE) LIQD Take 237 mLs by mouth 2 (two) times daily between meals. 237 mL 12  . Ferrous Sulfate 27 MG TABS Take 27 mg by mouth daily.    Marland Kitchen levETIRAcetam (KEPPRA) 250 MG tablet TAKE ONE TABLET TWICE DAILY 60 tablet 5  . loperamide (IMODIUM A-D) 2 MG tablet Take 2 mg by mouth 4  (four) times daily as needed for diarrhea or loose stools.    . memantine (NAMENDA XR) 28 MG CP24 24 hr capsule Take 1 capsule (28 mg total) by mouth daily. 90 capsule 3  . Methylfol-Algae-B12-Acetylcyst (CEREFOLIN NAC) 6-90.314-2-600 MG TABS Take 1 tablet by mouth daily. 90 tablet 3  . naproxen sodium (ANAPROX) 220 MG tablet Take 440 mg by mouth daily as needed (HEADACHE).    . pantoprazole (PROTONIX) 40 MG tablet TAKE ONE TABLET ONCE DAILY WITH BREAKFAST 30 tablet 5  . Potassium 99 MG TABS Take 99 mg by mouth daily.    . rivaroxaban (XARELTO) 20 MG TABS tablet Take 1 tablet (20 mg total) by mouth daily with supper. 30 tablet 2  . SPIRIVA HANDIHALER 18 MCG inhalation capsule Place 1 capsule into inhaler and inhale daily.  1  . VESICARE 10 MG tablet Take 5 mg by mouth daily. TAKES 0.5 TABLET     No current facility-administered medications for this visit.     REVIEW OF SYSTEMS:   [X]  denotes positive finding, [ ]  denotes negative finding Cardiac  Comments:  Chest pain or chest pressure:    Shortness of breath upon exertion:    Short of breath when lying flat:    Irregular heart rhythm:    Constitutional    Fever or chills:      PHYSICAL EXAM:   Vitals:   04/27/16 1552  BP:  124/80  Pulse: 76  Resp: 18  Temp: 97.1 F (36.2 C)  TempSrc: Oral  SpO2: 96%  Weight: 93 lb (42.2 kg)  Height: 5\' 1"  (1.549 m)    GENERAL: The patient is a well-nourished female, in no acute distress. The vital signs are documented above. CARDIOVASCULAR: There is a regular rate and rhythm. PULMONARY: Non-labored respirations Palpable dorsalis pedis pulse in the right.  Subtenon's nodule at the incision on the right which is mobile.  STUDIES:   ABIs are normal on the right. Arterial ultrasound shows widely patent superficial femoral artery with no stenosis at the level of the anastomosis Venous ultrasound negative for DVT   MEDICAL ISSUES:   I'm going to stop the patient's anticoagulation as  her DVT was secondary to compression of her femoral vein from her superficial femoral artery aneurysm which has been repaired.  There is no residual DVT on ultrasound today. The patient is scheduled to follow up in 6 months with repeat lower extremity arterial studies and DVT study.  [name], MD [facility] [phone] Pager [phone]

## 2016-04-28 NOTE — Addendum Note (Signed)
Addended by: Lianne Cure A on: 04/28/2016 11:23 AM   Modules accepted: Orders

## 2016-05-01 ENCOUNTER — Other Ambulatory Visit: Payer: Self-pay | Admitting: Neurology

## 2016-05-05 ENCOUNTER — Other Ambulatory Visit: Payer: Self-pay

## 2016-05-05 MED ORDER — CITALOPRAM HYDROBROMIDE 10 MG PO TABS: 10.0000 mg | ORAL_TABLET | Freq: Every day | ORAL | 2 refills | Status: DC

## 2016-05-06 DIAGNOSIS — L819 Disorder of pigmentation, unspecified: Secondary | ICD-10-CM | POA: Diagnosis not present

## 2016-05-06 DIAGNOSIS — M542 Cervicalgia: Secondary | ICD-10-CM | POA: Diagnosis not present

## 2016-05-06 DIAGNOSIS — Z8669 Personal history of other diseases of the nervous system and sense organs: Secondary | ICD-10-CM | POA: Diagnosis not present

## 2016-05-06 DIAGNOSIS — J449 Chronic obstructive pulmonary disease, unspecified: Secondary | ICD-10-CM | POA: Diagnosis not present

## 2016-05-27 DIAGNOSIS — Z960 Presence of urogenital implants: Secondary | ICD-10-CM | POA: Diagnosis not present

## 2016-05-27 DIAGNOSIS — Z8679 Personal history of other diseases of the circulatory system: Secondary | ICD-10-CM | POA: Diagnosis not present

## 2016-05-27 DIAGNOSIS — D508 Other iron deficiency anemias: Secondary | ICD-10-CM | POA: Diagnosis not present

## 2016-05-27 DIAGNOSIS — D519 Vitamin B12 deficiency anemia, unspecified: Secondary | ICD-10-CM | POA: Diagnosis not present

## 2016-05-27 DIAGNOSIS — J449 Chronic obstructive pulmonary disease, unspecified: Secondary | ICD-10-CM | POA: Diagnosis not present

## 2016-05-27 DIAGNOSIS — E782 Mixed hyperlipidemia: Secondary | ICD-10-CM | POA: Diagnosis not present

## 2016-05-27 DIAGNOSIS — M542 Cervicalgia: Secondary | ICD-10-CM | POA: Diagnosis not present

## 2016-06-02 ENCOUNTER — Ambulatory Visit (HOSPITAL_COMMUNITY)
Admission: RE | Admit: 2016-06-02 | Discharge: 2016-06-02 | Disposition: A | Discharge status: Home / Self Care | Payer: Medicare Other | Source: Ambulatory Visit | Attending: Urology | Admitting: Urology

## 2016-06-02 DIAGNOSIS — N1330 Unspecified hydronephrosis: Secondary | ICD-10-CM | POA: Diagnosis not present

## 2016-06-02 DIAGNOSIS — N131 Hydronephrosis with ureteral stricture, not elsewhere classified: Secondary | ICD-10-CM

## 2016-06-09 ENCOUNTER — Ambulatory Visit (INDEPENDENT_AMBULATORY_CARE_PROVIDER_SITE_OTHER): Payer: Medicare Other | Admitting: Urology

## 2016-06-09 DIAGNOSIS — N1330 Unspecified hydronephrosis: Secondary | ICD-10-CM

## 2016-06-10 ENCOUNTER — Other Ambulatory Visit (INDEPENDENT_AMBULATORY_CARE_PROVIDER_SITE_OTHER): Payer: Self-pay | Admitting: Internal Medicine

## 2016-06-15 ENCOUNTER — Other Ambulatory Visit: Payer: Self-pay | Admitting: Urology

## 2016-06-30 DIAGNOSIS — H52223 Regular astigmatism, bilateral: Secondary | ICD-10-CM | POA: Diagnosis not present

## 2016-06-30 DIAGNOSIS — Z961 Presence of intraocular lens: Secondary | ICD-10-CM | POA: Diagnosis not present

## 2016-06-30 DIAGNOSIS — H5203 Hypermetropia, bilateral: Secondary | ICD-10-CM | POA: Diagnosis not present

## 2016-06-30 DIAGNOSIS — H524 Presbyopia: Secondary | ICD-10-CM | POA: Diagnosis not present

## 2016-07-01 NOTE — Patient Instructions (Signed)
Bridget Ball  07/01/2016     @PREFPERIOPPHARMACY @   Your procedure is scheduled on  07/07/2016   Report to [facility] at  1045  A.M.  Call this number if you have problems the morning of surgery:  [phone]   Remember:  Do not eat food or drink liquids after midnight.  Take these medicines the morning of surgery with A SIP OF WATER  Entocort, celexa, keppra, namenda, protonix, vesicare. Take your inhaler before you come.   Do not wear jewelry, make-up or nail polish.  Do not wear lotions, powders, or perfumes, or deoderant.  Do not shave 48 hours prior to surgery.  Men may shave face and neck.  Do not bring valuables to the hospital.  [facility] is not responsible for any belongings or valuables.  Contacts, dentures or bridgework may not be worn into surgery.  Leave your suitcase in the car.  After surgery it may be brought to your room.  For patients admitted to the hospital, discharge time will be determined by your treatment team.  Patients discharged the day of surgery will not be allowed to drive home.   Name and phone number of your driver:   family Special instructions:  None  Please read over the following fact sheets that you were given. Anesthesia Post-op Instructions and Care and Recovery After Surgery       Cystoscopy Cystoscopy is a procedure that is used to help diagnose and sometimes treat conditions that affect that lower urinary tract. The lower urinary tract includes the bladder and the tube that drains urine from the bladder out of the body (urethra). Cystoscopy is performed with a thin, tube-shaped instrument with a light and camera at the end (cystoscope). The cystoscope may be hard (rigid) or flexible, depending on the goal of the procedure.The cystoscope is inserted through the urethra, into the bladder. Cystoscopy may be recommended if you have:  Urinary tractinfections that keep coming back (recurring).  Blood in the  urine (hematuria).  Loss of bladder control (urinary incontinence) or an overactive bladder.  Unusual cells found in a urine sample.  A blockage in the urethra.  Painful urination.  An abnormality in the bladder found during an intravenous pyelogram (IVP) or CT scan.  Cystoscopy may also be done to remove a sample of tissue to be examined under a microscope (biopsy). Tell a health care provider about:  Any allergies you have.  All medicines you are taking, including vitamins, herbs, eye drops, creams, and over-the-counter medicines.  Any problems you or family members have had with anesthetic medicines.  Any blood disorders you have.  Any surgeries you have had.  Any medical conditions you have.  Whether you are pregnant or may be pregnant. What are the risks? Generally, this is a safe procedure. However, problems may occur, including:  Infection.  Bleeding.  Allergic reactions to medicines.  Damage to other structures or organs.  What happens before the procedure?  Ask your health care provider about: ? Changing or stopping your regular medicines. This is especially important if you are taking diabetes medicines or blood thinners. ? Taking medicines such as aspirin and ibuprofen. These medicines can thin your blood. Do not take these medicines before your procedure if your health care provider instructs you not to.  Follow instructions from your health care provider about eating or drinking restrictions.  You may be given antibiotic medicine  to help prevent infection.  You may have an exam or testing, such as X-rays of the bladder, urethra, or kidneys.  You may have urine tests to check for signs of infection.  Plan to have someone take you home after the procedure. What happens during the procedure?  To reduce your risk of infection,your health care team will wash or sanitize their hands.  You will be given one or more of the following: ? A medicine to  help you relax (sedative). ? A medicine to numb the area (local anesthetic).  The area around the opening of your urethra will be cleaned.  The cystoscope will be passed through your urethra into your bladder.  Germ-free (sterile)fluid will flow through the cystoscope to fill your bladder. The fluid will stretch your bladder so that your surgeon can clearly examine your bladder walls.  The cystoscope will be removed and your bladder will be emptied. The procedure may vary among health care providers and hospitals. What happens after the procedure?  You may have some soreness or pain in your abdomen and urethra. Medicines will be available to help you.  You may have some blood in your urine.  Do not drive for 24 hours if you received a sedative. This information is not intended to replace advice given to you by your health care provider. Make sure you discuss any questions you have with your health care provider. Document Released: 12/20/1999 Document Revised: 05/02/2015 Document Reviewed: 11/08/2014 Elsevier Interactive Patient Education  2017 [place].  Cystoscopy, Care After Refer to this sheet in the next few weeks. These instructions provide you with information about caring for yourself after your procedure. Your health care provider may also give you more specific instructions. Your treatment has been planned according to current medical practices, but problems sometimes occur. Call your health care provider if you have any problems or questions after your procedure. What can I expect after the procedure? After the procedure, it is common to have:  Mild pain when you urinate. Pain should stop within a few minutes after you urinate. This may last for up to 1 week.  A small amount of blood in your urine for several days.  Feeling like you need to urinate but producing only a small amount of urine.  Follow these instructions at home:  Medicines  Take over-the-counter and  prescription medicines only as told by your health care provider.  If you were prescribed an antibiotic medicine, take it as told by your health care provider. Do not stop taking the antibiotic even if you start to feel better. General instructions   Return to your normal activities as told by your health care provider. Ask your health care provider what activities are safe for you.  Do not drive for 24 hours if you received a sedative.  Watch for any blood in your urine. If the amount of blood in your urine increases, call your health care provider.  Follow instructions from your health care provider about eating or drinking restrictions.  If a tissue sample was removed for testing (biopsy) during your procedure, it is your responsibility to get your test results. Ask your health care provider or the department performing the test when your results will be ready.  Drink enough fluid to keep your urine clear or pale yellow.  Keep all follow-up visits as told by your health care provider. This is important. Contact a health care provider if:  You have pain that gets worse or does  not get better with medicine, especially pain when you urinate.  You have difficulty urinating. Get help right away if:  You have more blood in your urine.  You have blood clots in your urine.  You have abdominal pain.  You have a fever or chills.  You are unable to urinate. This information is not intended to replace advice given to you by your health care provider. Make sure you discuss any questions you have with your health care provider. Document Released: 07/11/2004 Document Revised: 05/30/2015 Document Reviewed: 11/08/2014 Elsevier Interactive Patient Education  2017 [place].  Ureteral Stent Implantation Ureteral stent implantation is a procedure to insert (implant) a flexible, soft, plastic tube (stent) into a tube (ureter) that drains urine from the kidneys. The stent supports the ureter  while it heals and helps to drain urine from the kidneys. You may have a ureteral stent implanted after having a procedure to remove a blockage from the ureter (ureterolysis or pyeloplasty).You may also have a stent implanted to open the flow of urine when you have a blockage caused by a kidney stone, tumor, blood clot, or infection. You have two ureters, one on each side of the body. The ureters connect the kidneys to the organ that holds urine until it passes out of the body (bladder). The stent is placed so that one end is in the kidney, and one end is in the bladder. The stent is usually taken out after your ureter has healed. Depending on your condition, you may have a stent for just a few weeks, or you may have a long-term stent that will need to be replaced every few months. Tell a health care provider about:  Any allergies you have.  All medicines you are taking, including vitamins, herbs, eye drops, creams, and over-the-counter medicines.  Any problems you or family members have had with anesthetic medicines.  Any blood disorders you have.  Any surgeries you have had.  Any medical conditions you have.  Whether you are pregnant or may be pregnant. What are the risks? Generally, this is a safe procedure. However, problems may occur, including:  Infection.  Bleeding.  Allergic reactions to medicines.  Damage to other structures or organs. Tearing (perforation) of the ureter is possible.  Movement of the stent away from where it is placed during surgery (migration).  What happens before the procedure?  Ask your health care provider about: ? Changing or stopping your regular medicines. This is especially important if you are taking diabetes medicines or blood thinners. ? Taking medicines such as aspirin and ibuprofen. These medicines can thin your blood. Do not take these medicines before your procedure if your health care provider instructs you not to.  Follow instructions  from your health care provider about eating or drinking restrictions.  Do not drink alcohol and do not use any tobacco products before your procedure, as told by your health care provider.  You may be given antibiotic medicine to help prevent infection.  Plan to have someone take you home after the procedure.  If you go home right after the procedure, plan to have someone with you for 24 hours. What happens during the procedure?  An IV tube will be inserted into one of your veins.  You will be given a medicine to make you fall asleep (general anesthetic). You may also be given a medicine to help you relax (sedative).  A thin, tube-shaped instrument with a light and tiny camera at the end (cystoscope) will  be inserted into your urethra. The urethra is the tube that drains urine from the bladder out of the body. In men, the urethra opens at the end of the penis. In women, the urethra opens in front of the vaginal opening.  The cystoscope will be passed into your bladder.  A thin wire (guide wire) will be passed through your bladder and into your ureter. This is used to guide the stent into your ureter.  The stent will be inserted into your ureter.  The guide wire and the cystoscope will be removed.  A flexible tube (catheter) will be inserted through your urethra so that one end is in your bladder. This helps to drain urine from your bladder. The procedure may vary among hospitals and health care providers. What happens after the procedure?  Your blood pressure, heart rate, breathing rate, and blood oxygen level will be monitored often until the medicines you were given have worn off.  You may continue to receive medicine and fluids through an IV tube.  You may have some soreness or pain in your abdomen and urethra. Medicines will be available to help you.  You will be encouraged to get up and walk around as soon as you can.  You will have a catheter draining your urine.  You  will have some blood in your urine.  Do not drive for 24 hours if you received a sedative. This information is not intended to replace advice given to you by your health care provider. Make sure you discuss any questions you have with your health care provider. Document Released: 12/20/1999 Document Revised: 05/30/2015 Document Reviewed: 07/06/2014 Elsevier Interactive Patient Education  2018 [place].  Ureteral Stent Implantation, Care After Refer to this sheet in the next few weeks. These instructions provide you with information about caring for yourself after your procedure. Your health care provider may also give you more specific instructions. Your treatment has been planned according to current medical practices, but problems sometimes occur. Call your health care provider if you have any problems or questions after your procedure. What can I expect after the procedure? After the procedure, it is common to have:  Nausea.  Mild pain when you urinate. You may feel this pain in your lower back or lower abdomen. Pain should stop within a few minutes after you urinate. This may last for up to 1 week.  A small amount of blood in your urine for several days.  Follow these instructions at home:  Medicines  Take over-the-counter and prescription medicines only as told by your health care provider.  If you were prescribed an antibiotic medicine, take it as told by your health care provider. Do not stop taking the antibiotic even if you start to feel better.  Do not drive for 24 hours if you received a sedative.  Do not drive or operate heavy machinery while taking prescription pain medicines. Activity  Return to your normal activities as told by your health care provider. Ask your health care provider what activities are safe for you.  Do not lift anything that is heavier than 10 lb (4.5 kg). Follow this limit for 1 week after your procedure, or for as long as told by your health  care provider. General instructions  Watch for any blood in your urine. Call your health care provider if the amount of blood in your urine increases.  If you have a catheter: ? Follow instructions from your health care provider about taking care of  your catheter and collection bag. ? Do not take baths, swim, or use a hot tub until your health care provider approves.  Drink enough fluid to keep your urine clear or pale yellow.  Keep all follow-up visits as told by your health care provider. This is important. Contact a health care provider if:  You have pain that gets worse or does not get better with medicine, especially pain when you urinate.  You have difficulty urinating.  You feel nauseous or you vomit repeatedly during a period of more than 2 days after the procedure. Get help right away if:  Your urine is dark red or has blood clots in it.  You are leaking urine (have incontinence).  The end of the stent comes out of your urethra.  You cannot urinate.  You have sudden, sharp, or severe pain in your abdomen or lower back.  You have a fever. This information is not intended to replace advice given to you by your health care provider. Make sure you discuss any questions you have with your health care provider. Document Released: 08/24/2012 Document Revised: 05/30/2015 Document Reviewed: 07/06/2014 Elsevier Interactive Patient Education  2018 [place] Anesthesia, Adult General anesthesia is the use of medicines to make a person "go to sleep" (be unconscious) for a medical procedure. General anesthesia is often recommended when a procedure:  Is long.  Requires you to be still or in an unusual position.  Is major and can cause you to lose blood.  Is impossible to do without general anesthesia.  The medicines used for general anesthesia are called general anesthetics. In addition to making you sleep, the medicines:  Prevent pain.  Control your blood  pressure.  Relax your muscles.  Tell a health care provider about:  Any allergies you have.  All medicines you are taking, including vitamins, herbs, eye drops, creams, and over-the-counter medicines.  Any problems you or family members have had with anesthetic medicines.  Types of anesthetics you have had in the past.  Any bleeding disorders you have.  Any surgeries you have had.  Any medical conditions you have.  Any history of heart or lung conditions, such as heart failure, sleep apnea, or chronic obstructive pulmonary disease (COPD).  Whether you are pregnant or may be pregnant.  Whether you use tobacco, alcohol, marijuana, or street drugs.  Any history of Armed forces logistics/support/administrative officer.  Any history of depression or anxiety. What are the risks? Generally, this is a safe procedure. However, problems may occur, including:  Allergic reaction to anesthetics.  Lung and heart problems.  Inhaling food or liquids from your stomach into your lungs (aspiration).  Injury to nerves.  Waking up during your procedure and being unable to move (rare).  Extreme agitation or a state of mental confusion (delirium) when you wake up from the anesthetic.  Air in the bloodstream, which can lead to stroke.  These problems are more likely to develop if you are having a major surgery or if you have an advanced medical condition. You can prevent some of these complications by answering all of your health care provider's questions thoroughly and by following all pre-procedure instructions. General anesthesia can cause side effects, including:  Nausea or vomiting  A sore throat from the breathing tube.  Feeling cold or shivery.  Feeling tired, washed out, or achy.  Sleepiness or drowsiness.  Confusion or agitation.  What happens before the procedure? Staying hydrated Follow instructions from your health care provider about hydration, which  may include:  Up to 2 hours before the procedure  - you may continue to drink clear liquids, such as water, clear fruit juice, black coffee, and plain tea.  Eating and drinking restrictions Follow instructions from your health care provider about eating and drinking, which may include:  8 hours before the procedure - stop eating heavy meals or foods such as meat, fried foods, or fatty foods.  6 hours before the procedure - stop eating light meals or foods, such as toast or cereal.  6 hours before the procedure - stop drinking milk or drinks that contain milk.  2 hours before the procedure - stop drinking clear liquids.  Medicines  Ask your health care provider about: ? Changing or stopping your regular medicines. This is especially important if you are taking diabetes medicines or blood thinners. ? Taking medicines such as aspirin and ibuprofen. These medicines can thin your blood. Do not take these medicines before your procedure if your health care provider instructs you not to. ? Taking new dietary supplements or medicines. Do not take these during the week before your procedure unless your health care provider approves them.  If you are told to take a medicine or to continue taking a medicine on the day of the procedure, take the medicine with sips of water. General instructions   Ask if you will be going home the same day, the following day, or after a longer hospital stay. ? Plan to have someone take you home. ? Plan to have someone stay with you for the first 24 hours after you leave the hospital or clinic.  For 3-6 weeks before the procedure, try not to use any tobacco products, such as cigarettes, chewing tobacco, and e-cigarettes.  You may brush your teeth on the morning of the procedure, but make sure to spit out the toothpaste. What happens during the procedure?  You will be given anesthetics through a mask and through an IV tube in one of your veins.  You may receive medicine to help you relax (sedative).  As soon  as you are asleep, a breathing tube may be used to help you breathe.  An anesthesia specialist will stay with you throughout the procedure. He or she will help keep you comfortable and safe by continuing to give you medicines and adjusting the amount of medicine that you get. He or she will also watch your blood pressure, pulse, and oxygen levels to make sure that the anesthetics do not cause any problems.  If a breathing tube was used to help you breathe, it will be removed before you wake up. The procedure may vary among health care providers and hospitals. What happens after the procedure?  You will wake up, often slowly, after the procedure is complete, usually in a recovery area.  Your blood pressure, heart rate, breathing rate, and blood oxygen level will be monitored until the medicines you were given have worn off.  You may be given medicine to help you calm down if you feel anxious or agitated.  If you will be going home the same day, your health care provider may check to make sure you can stand, drink, and urinate.  Your health care providers will treat your pain and side effects before you go home.  Do not drive for 24 hours if you received a sedative.  You may: ? Feel nauseous and vomit. ? Have a sore throat. ? Have mental slowness. ? Feel cold or shivery. ? Feel sleepy. ?  Feel tired. ? Feel sore or achy, even in parts of your body where you did not have surgery. This information is not intended to replace advice given to you by your health care provider. Make sure you discuss any questions you have with your health care provider. Document Released: 03/31/2007 Document Revised: 06/04/2015 Document Reviewed: 12/06/2014 Elsevier Interactive Patient Education  2018 [place] Anesthesia, Adult, Care After These instructions provide you with information about caring for yourself after your procedure. Your health care provider may also give you more specific  instructions. Your treatment has been planned according to current medical practices, but problems sometimes occur. Call your health care provider if you have any problems or questions after your procedure. What can I expect after the procedure? After the procedure, it is common to have:  Vomiting.  A sore throat.  Mental slowness.  It is common to feel:  Nauseous.  Cold or shivery.  Sleepy.  Tired.  Sore or achy, even in parts of your body where you did not have surgery.  Follow these instructions at home: For at least 24 hours after the procedure:  Do not: ? Participate in activities where you could fall or become injured. ? Drive. ? Use heavy machinery. ? Drink alcohol. ? Take sleeping pills or medicines that cause drowsiness. ? Make important decisions or sign legal documents. ? Take care of children on your own.  Rest. Eating and drinking  If you vomit, drink water, juice, or soup when you can drink without vomiting.  Drink enough fluid to keep your urine clear or pale yellow.  Make sure you have little or no nausea before eating solid foods.  Follow the diet recommended by your health care provider. General instructions  Have a responsible adult stay with you until you are awake and alert.  Return to your normal activities as told by your health care provider. Ask your health care provider what activities are safe for you.  Take over-the-counter and prescription medicines only as told by your health care provider.  If you smoke, do not smoke without supervision.  Keep all follow-up visits as told by your health care provider. This is important. Contact a health care provider if:  You continue to have nausea or vomiting at home, and medicines are not helpful.  You cannot drink fluids or start eating again.  You cannot urinate after 8-12 hours.  You develop a skin rash.  You have fever.  You have increasing redness at the site of your  procedure. Get help right away if:  You have difficulty breathing.  You have chest pain.  You have unexpected bleeding.  You feel that you are having a life-threatening or urgent problem. This information is not intended to replace advice given to you by your health care provider. Make sure you discuss any questions you have with your health care provider. Document Released: 03/30/2000 Document Revised: 05/27/2015 Document Reviewed: 12/06/2014 Elsevier Interactive Patient Education  [employer].

## 2016-07-06 ENCOUNTER — Encounter (HOSPITAL_COMMUNITY): Payer: Self-pay

## 2016-07-06 ENCOUNTER — Encounter (HOSPITAL_COMMUNITY)
Admission: RE | Admit: 2016-07-06 | Discharge: 2016-07-06 | Disposition: A | Discharge status: Home / Self Care | Payer: Medicare Other | Source: Ambulatory Visit | Attending: Urology | Admitting: Urology

## 2016-07-06 DIAGNOSIS — F0390 Unspecified dementia without behavioral disturbance: Secondary | ICD-10-CM | POA: Diagnosis not present

## 2016-07-06 DIAGNOSIS — N135 Crossing vessel and stricture of ureter without hydronephrosis: Secondary | ICD-10-CM | POA: Diagnosis present

## 2016-07-06 DIAGNOSIS — Z87891 Personal history of nicotine dependence: Secondary | ICD-10-CM | POA: Diagnosis not present

## 2016-07-06 DIAGNOSIS — E785 Hyperlipidemia, unspecified: Secondary | ICD-10-CM | POA: Diagnosis not present

## 2016-07-06 DIAGNOSIS — K219 Gastro-esophageal reflux disease without esophagitis: Secondary | ICD-10-CM | POA: Diagnosis not present

## 2016-07-06 DIAGNOSIS — Z960 Presence of urogenital implants: Secondary | ICD-10-CM | POA: Diagnosis not present

## 2016-07-06 DIAGNOSIS — Z8673 Personal history of transient ischemic attack (TIA), and cerebral infarction without residual deficits: Secondary | ICD-10-CM | POA: Diagnosis not present

## 2016-07-06 DIAGNOSIS — N131 Hydronephrosis with ureteral stricture, not elsewhere classified: Secondary | ICD-10-CM | POA: Diagnosis not present

## 2016-07-06 DIAGNOSIS — F329 Major depressive disorder, single episode, unspecified: Secondary | ICD-10-CM | POA: Diagnosis not present

## 2016-07-06 DIAGNOSIS — F419 Anxiety disorder, unspecified: Secondary | ICD-10-CM | POA: Diagnosis not present

## 2016-07-06 DIAGNOSIS — J449 Chronic obstructive pulmonary disease, unspecified: Secondary | ICD-10-CM | POA: Diagnosis not present

## 2016-07-06 DIAGNOSIS — Z79899 Other long term (current) drug therapy: Secondary | ICD-10-CM | POA: Diagnosis not present

## 2016-07-06 DIAGNOSIS — I252 Old myocardial infarction: Secondary | ICD-10-CM | POA: Diagnosis not present

## 2016-07-06 DIAGNOSIS — Z85828 Personal history of other malignant neoplasm of skin: Secondary | ICD-10-CM | POA: Diagnosis not present

## 2016-07-06 LAB — CBC WITH DIFFERENTIAL/PLATELET
Basophils Absolute: 0 10*3/uL (ref 0.0–0.1)
Basophils Relative: 0 %
EOS PCT: 5 %
Eosinophils Absolute: 0.3 10*3/uL (ref 0.0–0.7)
HCT: 38.8 % (ref 36.0–46.0)
HEMOGLOBIN: 12.6 g/dL (ref 12.0–15.0)
Lymphocytes Relative: 22 %
Lymphs Abs: 1.1 10*3/uL (ref 0.7–4.0)
MCH: 31.7 pg (ref 26.0–34.0)
MCHC: 32.5 g/dL (ref 30.0–36.0)
MCV: 97.7 fL (ref 78.0–100.0)
MONOS PCT: 10 %
Monocytes Absolute: 0.5 10*3/uL (ref 0.1–1.0)
Neutro Abs: 3.1 10*3/uL (ref 1.7–7.7)
Neutrophils Relative %: 63 %
PLATELETS: 194 10*3/uL (ref 150–400)
RBC: 3.97 MIL/uL (ref 3.87–5.11)
RDW: 14.6 % (ref 11.5–15.5)
WBC: 5 10*3/uL (ref 4.0–10.5)

## 2016-07-06 LAB — BASIC METABOLIC PANEL
Anion gap: 9 (ref 5–15)
BUN: 18 mg/dL (ref 6–20)
CHLORIDE: 102 mmol/L (ref 101–111)
CO2: 27 mmol/L (ref 22–32)
CREATININE: 0.92 mg/dL (ref 0.44–1.00)
Calcium: 9.1 mg/dL (ref 8.9–10.3)
GFR calc Af Amer: >60 mL/min (ref >60)
GFR calc non Af Amer: 55 mL/min — ABNORMAL LOW (ref >60)
Glucose, Bld: 93 mg/dL (ref 65–99)
Potassium: 3.9 mmol/L (ref 3.5–5.1)
SODIUM: 138 mmol/L (ref 135–145)

## 2016-07-07 ENCOUNTER — Ambulatory Visit (HOSPITAL_COMMUNITY): Payer: Medicare Other | Admitting: Anesthesiology

## 2016-07-07 ENCOUNTER — Encounter (HOSPITAL_COMMUNITY)
Admission: RE | Disposition: A | Discharge status: Home / Self Care | Payer: Self-pay | Source: Ambulatory Visit | Attending: Urology

## 2016-07-07 ENCOUNTER — Encounter (HOSPITAL_COMMUNITY): Payer: Self-pay | Admitting: *Deleted

## 2016-07-07 ENCOUNTER — Ambulatory Visit (HOSPITAL_COMMUNITY)
Admission: RE | Admit: 2016-07-07 | Discharge: 2016-07-07 | Disposition: A | Discharge status: Home / Self Care | Payer: Medicare Other | Source: Ambulatory Visit | Attending: Urology | Admitting: Urology

## 2016-07-07 ENCOUNTER — Ambulatory Visit (HOSPITAL_COMMUNITY): Payer: Medicare Other

## 2016-07-07 DIAGNOSIS — Z87891 Personal history of nicotine dependence: Secondary | ICD-10-CM | POA: Insufficient documentation

## 2016-07-07 DIAGNOSIS — Z79899 Other long term (current) drug therapy: Secondary | ICD-10-CM | POA: Insufficient documentation

## 2016-07-07 DIAGNOSIS — N131 Hydronephrosis with ureteral stricture, not elsewhere classified: Secondary | ICD-10-CM | POA: Insufficient documentation

## 2016-07-07 DIAGNOSIS — K219 Gastro-esophageal reflux disease without esophagitis: Secondary | ICD-10-CM | POA: Insufficient documentation

## 2016-07-07 DIAGNOSIS — Z8673 Personal history of transient ischemic attack (TIA), and cerebral infarction without residual deficits: Secondary | ICD-10-CM | POA: Diagnosis not present

## 2016-07-07 DIAGNOSIS — I252 Old myocardial infarction: Secondary | ICD-10-CM | POA: Insufficient documentation

## 2016-07-07 DIAGNOSIS — Z960 Presence of urogenital implants: Secondary | ICD-10-CM | POA: Diagnosis not present

## 2016-07-07 DIAGNOSIS — E785 Hyperlipidemia, unspecified: Secondary | ICD-10-CM | POA: Insufficient documentation

## 2016-07-07 DIAGNOSIS — Z85828 Personal history of other malignant neoplasm of skin: Secondary | ICD-10-CM | POA: Diagnosis not present

## 2016-07-07 DIAGNOSIS — N1330 Unspecified hydronephrosis: Secondary | ICD-10-CM | POA: Diagnosis not present

## 2016-07-07 DIAGNOSIS — F419 Anxiety disorder, unspecified: Secondary | ICD-10-CM | POA: Diagnosis not present

## 2016-07-07 DIAGNOSIS — J449 Chronic obstructive pulmonary disease, unspecified: Secondary | ICD-10-CM | POA: Insufficient documentation

## 2016-07-07 DIAGNOSIS — F329 Major depressive disorder, single episode, unspecified: Secondary | ICD-10-CM | POA: Diagnosis not present

## 2016-07-07 DIAGNOSIS — F0390 Unspecified dementia without behavioral disturbance: Secondary | ICD-10-CM | POA: Insufficient documentation

## 2016-07-07 HISTORY — PX: CYSTOSCOPY W/ URETERAL STENT PLACEMENT: SHX1429

## 2016-07-07 SURGERY — CYSTOSCOPY, FLEXIBLE, WITH STENT REPLACEMENT
Anesthesia: Monitor Anesthesia Care | Laterality: Left

## 2016-07-07 MED ORDER — SULFAMETHOXAZOLE-TRIMETHOPRIM 800-160 MG PO TABS: 1.0000 | ORAL_TABLET | Freq: Two times a day (BID) | ORAL | 0 refills | Status: DC

## 2016-07-07 MED ORDER — FENTANYL CITRATE (PF) 100 MCG/2ML IJ SOLN
INTRAMUSCULAR | Status: DC | PRN
  Administered 2016-07-07 (×2): 25 ug via INTRAVENOUS

## 2016-07-07 MED ORDER — PROPOFOL 500 MG/50ML IV EMUL
INTRAVENOUS | Status: DC | PRN
  Administered 2016-07-07: 50 ug/kg/min via INTRAVENOUS

## 2016-07-07 MED ORDER — STERILE WATER FOR IRRIGATION IR SOLN
Status: DC | PRN
  Administered 2016-07-07: 1000 mL

## 2016-07-07 MED ORDER — MIDAZOLAM HCL 2 MG/2ML IJ SOLN
INTRAMUSCULAR | Status: AC
  Filled 2016-07-07: qty 2

## 2016-07-07 MED ORDER — DIATRIZOATE MEGLUMINE 30 % UR SOLN
URETHRAL | Status: DC | PRN
  Administered 2016-07-07: 10 mL

## 2016-07-07 MED ORDER — LACTATED RINGERS IV SOLN
INTRAVENOUS | Status: DC
  Administered 2016-07-07: 12:00:00 via INTRAVENOUS

## 2016-07-07 MED ORDER — MIDAZOLAM HCL 2 MG/2ML IJ SOLN
1.0000 mg | INTRAMUSCULAR | Status: AC
  Administered 2016-07-07: 1 mg via INTRAVENOUS

## 2016-07-07 MED ORDER — LIDOCAINE VISCOUS 2 % MT SOLN
OROMUCOSAL | Status: DC | PRN
  Administered 2016-07-07: 15 mL

## 2016-07-07 MED ORDER — GENTAMICIN SULFATE 40 MG/ML IJ SOLN
5.0000 mg/kg | INTRAVENOUS | Status: AC
  Administered 2016-07-07: 210 mg via INTRAVENOUS
  Filled 2016-07-07: qty 5.25

## 2016-07-07 MED ORDER — FENTANYL CITRATE (PF) 100 MCG/2ML IJ SOLN: 25.0000 ug | INTRAMUSCULAR | Status: DC | PRN

## 2016-07-07 MED ORDER — LIDOCAINE VISCOUS 2 % MT SOLN
OROMUCOSAL | Status: AC
Start: 2016-07-07 — End: 2016-07-07
  Filled 2016-07-07: qty 15

## 2016-07-07 MED ORDER — FENTANYL CITRATE (PF) 100 MCG/2ML IJ SOLN
INTRAMUSCULAR | Status: AC
  Filled 2016-07-07: qty 2

## 2016-07-07 MED ORDER — IPRATROPIUM-ALBUTEROL 0.5-2.5 (3) MG/3ML IN SOLN
3.0000 mL | Freq: Once | RESPIRATORY_TRACT | Status: AC
  Administered 2016-07-07: 3 mL via RESPIRATORY_TRACT

## 2016-07-07 MED ORDER — IPRATROPIUM-ALBUTEROL 0.5-2.5 (3) MG/3ML IN SOLN
RESPIRATORY_TRACT | Status: AC
  Filled 2016-07-07: qty 3

## 2016-07-07 MED ORDER — LIDOCAINE HCL 2 % EX GEL
CUTANEOUS | Status: AC
  Filled 2016-07-07: qty 10

## 2016-07-07 MED ORDER — SODIUM CHLORIDE 0.9 % IR SOLN
Status: DC | PRN
  Administered 2016-07-07: 3000 mL via INTRAVESICAL

## 2016-07-07 MED ORDER — DIATRIZOATE MEGLUMINE 30 % UR SOLN
URETHRAL | Status: AC
  Filled 2016-07-07: qty 300

## 2016-07-07 MED ORDER — EPHEDRINE SULFATE 50 MG/ML IJ SOLN
INTRAMUSCULAR | Status: DC | PRN
  Administered 2016-07-07: 10 mg via INTRAVENOUS

## 2016-07-07 MED ORDER — PROPOFOL 10 MG/ML IV BOLUS
INTRAVENOUS | Status: AC
  Filled 2016-07-07: qty 40

## 2016-07-07 MED ORDER — MIDAZOLAM HCL 5 MG/5ML IJ SOLN
INTRAMUSCULAR | Status: DC | PRN
  Administered 2016-07-07 (×2): 1 mg via INTRAVENOUS

## 2016-07-07 SURGICAL SUPPLY — 18 items
BAG DRAIN URO TABLE W/ADPT NS (DRAPE) ×3 IMPLANT
BAG DRN 8 ADPR NS SKTRN CSTL (DRAPE) ×1
BAG HAMPER (MISCELLANEOUS) ×3 IMPLANT
CATH INTERMIT  6FR 70CM (CATHETERS) ×3 IMPLANT
CLOTH BEACON ORANGE TIMEOUT ST (SAFETY) ×3 IMPLANT
GLOVE BIOGEL M 8.0 STRL (GLOVE) ×3 IMPLANT
GLOVE ECLIPSE 6.5 STRL STRAW (GLOVE) ×2 IMPLANT
GOWN STRL REUS W/ TWL LRG LVL3 (GOWN DISPOSABLE) ×1 IMPLANT
GOWN STRL REUS W/TWL LRG LVL3 (GOWN DISPOSABLE) ×3
GOWN STRL REUS W/TWL XL LVL3 (GOWN DISPOSABLE) ×3 IMPLANT
GUIDEWIRE STR DUAL SENSOR (WIRE) ×2 IMPLANT
KIT ROOM TURNOVER AP CYSTO (KITS) ×3 IMPLANT
MANIFOLD NEPTUNE II (INSTRUMENTS) ×3 IMPLANT
PACK CYSTO (CUSTOM PROCEDURE TRAY) ×3 IMPLANT
PAD ARMBOARD 7.5X6 YLW CONV (MISCELLANEOUS) ×3 IMPLANT
STENT URET 6FRX24 CONTOUR (STENTS) ×2 IMPLANT
TOWEL OR 17X26 4PK STRL BLUE (TOWEL DISPOSABLE) ×3 IMPLANT
WATER STERILE IRR 1000ML POUR (IV SOLUTION) ×2 IMPLANT

## 2016-07-07 NOTE — Interval H&P Note (Signed)
History and Physical Interval Note:  07/07/2016 12:35 PM  Bridget Ball  has presented today for surgery, with the diagnosis of LEFT HYDRONEPHROSIS  The various methods of treatment have been discussed with the patient and family. After consideration of risks, benefits and other options for treatment, the patient has consented to  Procedure(s): CYSTOSCOPY WITH LEFT URETERAL STENT REPLACEMENT (Left) as a surgical intervention .  The patient's history has been reviewed, patient examined, no change in status, stable for surgery.  I have reviewed the patient's chart and labs.  Questions were answered to the patient's satisfaction.     Jorja Loa

## 2016-07-07 NOTE — Anesthesia Postprocedure Evaluation (Signed)
Anesthesia Post Note  Patient: Bridget Ball  Procedure(s) Performed: Procedure(s) (LRB): CYSTOSCOPY, LEFT RETROGRADE PYELOGRAM WITH LEFT URETERAL STENT REPLACEMENT (Left)  Patient location during evaluation: PACU Anesthesia Type: MAC Level of consciousness: awake and alert and patient cooperative Pain management: pain level controlled Vital Signs Assessment: post-procedure vital signs reviewed and stable Respiratory status: spontaneous breathing, nonlabored ventilation and respiratory function stable Cardiovascular status: blood pressure returned to baseline Postop Assessment: no signs of nausea or vomiting Anesthetic complications: no     Last Vitals:  Vitals:   07/07/16 1230 07/07/16 1307  BP: 109/62 115/70  Pulse:  98  Resp: 17 14  Temp:  36.5 C    Last Pain:  Vitals:   07/07/16 1121  TempSrc: Oral                 Yamaira Spinner J

## 2016-07-07 NOTE — Discharge Instructions (Signed)

## 2016-07-07 NOTE — Transfer of Care (Signed)
Immediate Anesthesia Transfer of Care Note  Patient: Bridget Ball  Procedure(s) Performed: Procedure(s): CYSTOSCOPY, LEFT RETROGRADE PYELOGRAM WITH LEFT URETERAL STENT REPLACEMENT (Left)  Patient Location: PACU  Anesthesia Type:MAC  Level of Consciousness: awake and patient cooperative  Airway & Oxygen Therapy: Patient Spontanous Breathing and Patient connected to face mask oxygen  Post-op Assessment: Report given to RN, Post -op Vital signs reviewed and stable and Patient moving all extremities  Post vital signs: Reviewed and stable  Last Vitals:  Vitals:   07/07/16 1226 07/07/16 1230  BP:  109/62  Pulse:    Resp: (!) 21 17  Temp:      Last Pain:  Vitals:   07/07/16 1121  TempSrc: Oral         Complications: No apparent anesthesia complications

## 2016-07-07 NOTE — Op Note (Signed)
Preoperative diagnosis: Left UPJ obstruction/hydronephrosis, with indwelling stent.  Postoperative diagnosis: Same  Principal procedure: Cystoscopy, extraction of left double-J stent, left retrograde ureteropyelogram with fluoroscopic interpretation, placement of 6 French by 24 centimeter contour stent without tether  Surgeon: Eber Ferrufino  Anesthesia: Monitored anesthesia care.  Specimens: None.  Drains: None.  Complications: None.  Estimated blood loss: Less than 5 milliliters  Indications: 83 year old female 3 years out from pyonephrosis secondary to a more than likely chronic UPJ obstruction.  The patient has been managed with a stent.  Since that time.  Due to her age and medical condition.  She presents at this time for stent exchange.  Description of procedure: The patient was properly identified in the holding area.  Surgical site was marked and she received preoperative IV antibiotics.  She was then taken to the operating room where she was placed in the dorsolithotomy position with monitored anesthesia care being administered.  Timeout was performed.  A 21 French panendoscope was advanced into the bladder which was inspected and found to be normal except for the indwelling left ureteral stent.  There were encrustations on the bladder and of this.  Was easily extracted.  6 [place] open-ended catheter was used to perform a gentle retrograde ureteropyelogram using Omnipaque.  Ureteropyelogram revealed a normal ureter distally was some tortuosity proximally.  There was beaking at the UPJ consistent with an obstruction.  There was minimal pyelocaliectasis.  No filling defect for seen.  Following the ureteropyelogram, a guidewire was advanced into the upper pole calyceal system through the open-ended catheter.  The open-ended catheter was removed and then a 24 centimeter by 6 [place] contour double-J stent, with the string removed, was adequately deployed in the ureter following adequate  guidance using fluoroscopy and cystoscopy.  At this point, the bladder was drained, the scope was removed.  The patient was awakened and taken to the PACU in stable condition, having tolerated the procedure well.

## 2016-07-07 NOTE — H&P (Signed)
H&P  Chief Complaint: blocked kidney  History of Present Illness: Bridget Ball is a 83 y.o. year old female ho presents at this time for cystoscopy and double-J stent exchange on the left.  He initially presented approximately 3years ago with an infected, obstructed left renal unit.  She underwent urgent decompression and has had a stent since that time.  Past Medical History:  Diagnosis Date  . Anxiety   . Arthritis   . Attention to nephrostomy ([place])    PT HAS NEPHROSTOMY TUBE IN PLACE  . Bruises easily   . Cancer [facility])    Skin cancer  . Chronic diarrhea   . Colitis   . COPD (chronic obstructive pulmonary disease) ([place])   . Dementia   . Depression   . GERD (gastroesophageal reflux disease)   . Hiatal hernia   . Hx of bronchitis   . Hx of pulmonary edema JULY 2015  . Hx of pulmonary edema JULY 2015  . Hx of septic shock JULY 2015  . Hydronephrosis, left   . Hyperlipidemia   . Memory difficulties   . Microscopic colitis   . Myocardial infarction [facility])    unknown time   . Renal insufficiency   . Seizures ([place])    last seizure was 4-5 years ago with "brain bleed"  . Shortness of breath    with exertion  . Stroke [facility])    TIA four years ago / stroke NOV 2014    Past Surgical History:  Procedure Laterality Date  . APPENDECTOMY    . BREAST BIOPSY Right 02/02/2014   Procedure: RIGHT BREAST BIOPSY;  Surgeon: Jamesetta So, MD;  Location: [facility] ORS;  Service: General;  Laterality: Right;  . COLONOSCOPY N/A 05/24/2013   Procedure: COLONOSCOPY;  Surgeon: Rogene Houston, MD;  Location: [facility] ENDO SUITE;  Service: Endoscopy;  Laterality: N/A;  100  . CYSTOSCOPY W/ URETERAL STENT PLACEMENT Left 08/06/2015   Procedure: CYSTOSCOPY WITH LEFT URETERAL STENT EXCHANGE - Sammie Bench;  Surgeon: Franchot Gallo, MD;  Location: [facility] OR[facility];  Service: Urology;  Laterality: Left;  . CYSTOSCOPY WITH RETROGRADE PYELOGRAM, URETEROSCOPY AND STENT PLACEMENT Left 09/18/2013   Procedure: CYSTOSCOPY WITH  RETROGRADE PYELOGRAM,  URETEROSCOPY AND  STENT PLACEMENT, removal of nephrostomy tube;  Surgeon: Jorja Loa, MD;  Location: [facility] OR[facility];  Service: Urology;  Laterality: Left;  . CYSTOSCOPY WITH STENT PLACEMENT Left 09/04/2014   Procedure: CYSTOSCOPY WITH LEFT J2 STENT EXCHANGE;  Surgeon: Franchot Gallo, MD;  Location: [facility];  Service: Urology;  Laterality: Left;  . ENDOVASCULAR REPAIR OF POPLITEAL ARTERY ANEURYSM  02/05/2016  . ESOPHAGOGASTRODUODENOSCOPY N/A 05/24/2013   Procedure: ESOPHAGOGASTRODUODENOSCOPY (EGD);  Surgeon: Rogene Houston, MD;  Location[facility];  Service: Endoscopy;  Laterality: N/A;  . FALSE ANEURYSM REPAIR Right 02/05/2016   Procedure: REPAIR RIGHT POLITEAL  ANEURYSM;  Surgeon: Serafina Mitchell, MD;  Location: [place];  Service: Vascular;  Laterality: Right;  . KNEE ARTHROSCOPY Right   . SKIN CANCER EXCISION    . TONSILLECTOMY    . TUBAL LIGATION      Home Medications:  No prescriptions prior to admission.    Allergies:    Family History  Problem Relation Age of Onset  . Diabetes Mother   . Stroke Mother   . Heart attack Father     Social History:  reports that she quit smoking about 18 years ago. Her smoking use included Cigarettes. She has a 50.00 pack-year smoking history. She has never used  smokeless tobacco. She reports that she drinks about 4.2 oz of alcohol per week . She reports that she does not use drugs.  ROS: A complete review of systems was performed.  All systems are negative except for pertinent findings as noted.  Physical Exam:  Vital signs in last 24 hours: Temp:  [98.2 F (36.8 C)] 98.2 F (36.8 C) (07/02 1014) Pulse Rate:  [68] 68 (07/02 1014) Resp:  [18] 18 (07/02 1014) BP: (108)/(67) 108/67 (07/02 1014) SpO2:  [98 %] 98 % (07/02 1014) Weight:  [42.5 kg (93 lb 9.6 oz)] 42.5 kg (93 lb 9.6 oz) (07/02 1014) General:  Alert and oriented, No acute distress. Very thin. HEENT: Normocephalic, atraumatic Neck: No JVD or  lymphadenopathy Cardiovascular: Regular rate and rhythm Lungs: Clear bilaterally Abdomen: Soft, nontender, nondistended, no abdominal masses Back: No CVA tenderness Extremities: No edema Neurologic: Grossly intact  Laboratory Data:  Results for orders placed or performed during the hospital encounter of 07/06/16 (from the past 24 hour(s))  CBC with Differential/Platelet     Status: None   Collection Time: 07/06/16 10:20 AM  Result Value Ref Range   WBC 5.0 4.0 - 10.5 K/uL   RBC 3.97 3.87 - 5.11 MIL/uL   Hemoglobin 12.6 12.0 - 15.0 g/dL   HCT 38.8 36.0 - 46.0 %   MCV 97.7 78.0 - 100.0 fL   MCH 31.7 26.0 - 34.0 pg   MCHC 32.5 30.0 - 36.0 g/dL   RDW 14.6 11.5 - 15.5 %   Platelets 194 150 - 400 K/uL   Neutrophils Relative % 63 %   Neutro Abs 3.1 1.7 - 7.7 K/uL   Lymphocytes Relative 22 %   Lymphs Abs 1.1 0.7 - 4.0 K/uL   Monocytes Relative 10 %   Monocytes Absolute 0.5 0.1 - 1.0 K/uL   Eosinophils Relative 5 %   Eosinophils Absolute 0.3 0.0 - 0.7 K/uL   Basophils Relative 0 %   Basophils Absolute 0.0 0.0 - 0.1 K/uL  Basic metabolic panel     Status: Abnormal   Collection Time: 07/06/16 10:20 AM  Result Value Ref Range   Sodium 138 135 - 145 mmol/L   Potassium 3.9 3.5 - 5.1 mmol/L   Chloride 102 101 - 111 mmol/L   CO2 27 22 - 32 mmol/L   Glucose, Bld 93 65 - 99 mg/dL   BUN 18 6 - 20 mg/dL   Creatinine, Ser 0.92 0.44 - 1.00 mg/dL   Calcium 9.1 8.9 - 10.3 mg/dL   GFR calc non Af Amer 55 (L) >60 mL/min   GFR calc Af Amer >60 >60 mL/min   Anion gap 9 5 - 15   No results found for this or any previous visit (from the past 240 hour(s)). Creatinine:  Recent Labs  07/06/16 1020  CREATININE 0.92    Radiologic Imaging: No results found.  Impression/Assessment:  Left UPJ obstruction with stent in place  Plan:  Cystoscopy, left J2 stent exchange  Dragon Thrush M 07/07/2016, 8:22 AM  Lillette Boxer. Taos Tapp MD

## 2016-07-07 NOTE — Anesthesia Preprocedure Evaluation (Addendum)
Anesthesia Evaluation  Patient identified by MRN, date of birth, ID band Patient awake    Reviewed: Allergy & Precautions, H&P , NPO status , Patient's Chart, lab work & pertinent test results  History of Anesthesia Complications Negative for: history of anesthetic complications  Airway Mallampati: II  TM Distance: >3 FB Neck ROM: Full    Dental no notable dental hx. (+) Edentulous Upper, Edentulous Lower   Pulmonary shortness of breath, COPD,  COPD inhaler, former smoker,    Pulmonary exam normal breath sounds clear to auscultation       Cardiovascular hypertension, + Past MI  negative cardio ROS   Rhythm:Regular Rate:Normal     Neuro/Psych Seizures -, Well Controlled,  PSYCHIATRIC DISORDERS Anxiety Depression  Neuromuscular disease CVA    GI/Hepatic Neg liver ROS, hiatal hernia, GERD  Medicated and Controlled,  Endo/Other  negative endocrine ROS  Renal/GU Renal disease     Musculoskeletal  (+) Arthritis ,   Abdominal   Peds  Hematology negative hematology ROS (+)   Anesthesia Other Findings   Reproductive/Obstetrics negative OB ROS                             Anesthesia Physical Anesthesia Plan  ASA: III  Anesthesia Plan: MAC   Post-op Pain Management:    Induction: Intravenous  PONV Risk Score and Plan:   Airway Management Planned: LMA  Additional Equipment:   Intra-op Plan:   Post-operative Plan: Extubation in OR  Informed Consent: I have reviewed the patients History and Physical, chart, labs and discussed the procedure including the risks, benefits and alternatives for the proposed anesthesia with the patient or authorized representative who has indicated his/her understanding and acceptance.     Plan Discussed with:   Anesthesia Plan Comments:        Anesthesia Quick Evaluation

## 2016-07-07 NOTE — Addendum Note (Signed)
Addendum  created 07/07/16 1331 by Lerry Liner, MD   Anesthesia Attestations filed

## 2016-07-09 ENCOUNTER — Encounter (HOSPITAL_COMMUNITY): Payer: Self-pay | Admitting: Urology

## 2016-09-08 ENCOUNTER — Other Ambulatory Visit: Payer: Self-pay

## 2016-09-08 ENCOUNTER — Telehealth: Payer: Self-pay | Admitting: Neurology

## 2016-09-08 MED ORDER — MEMANTINE HCL ER 28 MG PO CP24: 28.0000 mg | ORAL_CAPSULE | Freq: Every day | ORAL | 3 refills | Status: DC

## 2016-09-08 NOTE — Telephone Encounter (Addendum)
Rn spoke with daughter. Pt only wants [facility] to [facility]. [facility] does not keep it on stock at times. [facility],and [facility]refolin at [facility] direct.[facility] [place].

## 2016-09-08 NOTE — Telephone Encounter (Signed)
Pt daughter calling re: the memantine (NAMENDA XR) 28 MG CP24 24 hr capsule, she states the pharmacy ([facility] [place] [phone] )has been faxing the office for refill.  Pt has been without medication for about 2 weeks now, please fill

## 2016-09-14 DIAGNOSIS — D225 Melanocytic nevi of trunk: Secondary | ICD-10-CM | POA: Diagnosis not present

## 2016-09-14 DIAGNOSIS — L814 Other melanin hyperpigmentation: Secondary | ICD-10-CM | POA: Diagnosis not present

## 2016-09-14 DIAGNOSIS — L570 Actinic keratosis: Secondary | ICD-10-CM | POA: Diagnosis not present

## 2016-09-14 DIAGNOSIS — L821 Other seborrheic keratosis: Secondary | ICD-10-CM | POA: Diagnosis not present

## 2016-09-14 DIAGNOSIS — D1801 Hemangioma of skin and subcutaneous tissue: Secondary | ICD-10-CM | POA: Diagnosis not present

## 2016-09-28 ENCOUNTER — Ambulatory Visit: Payer: Medicare Other | Admitting: Nurse Practitioner

## 2016-09-28 DIAGNOSIS — I10 Essential (primary) hypertension: Secondary | ICD-10-CM | POA: Diagnosis not present

## 2016-09-30 DIAGNOSIS — D508 Other iron deficiency anemias: Secondary | ICD-10-CM | POA: Diagnosis not present

## 2016-09-30 DIAGNOSIS — M25511 Pain in right shoulder: Secondary | ICD-10-CM | POA: Diagnosis not present

## 2016-09-30 DIAGNOSIS — R944 Abnormal results of kidney function studies: Secondary | ICD-10-CM | POA: Diagnosis not present

## 2016-09-30 DIAGNOSIS — M542 Cervicalgia: Secondary | ICD-10-CM | POA: Diagnosis not present

## 2016-09-30 DIAGNOSIS — Z8679 Personal history of other diseases of the circulatory system: Secondary | ICD-10-CM | POA: Diagnosis not present

## 2016-10-06 ENCOUNTER — Encounter (INDEPENDENT_AMBULATORY_CARE_PROVIDER_SITE_OTHER): Payer: Self-pay

## 2016-10-06 ENCOUNTER — Encounter (INDEPENDENT_AMBULATORY_CARE_PROVIDER_SITE_OTHER): Payer: Self-pay | Admitting: Internal Medicine

## 2016-10-06 ENCOUNTER — Ambulatory Visit (INDEPENDENT_AMBULATORY_CARE_PROVIDER_SITE_OTHER): Payer: Medicare Other | Admitting: Internal Medicine

## 2016-10-06 VITALS — BP 100/64 | HR 70 | Temp 98.0°F | Wt 100.4 lb

## 2016-10-06 DIAGNOSIS — K52832 Lymphocytic colitis: Secondary | ICD-10-CM | POA: Diagnosis not present

## 2016-10-06 DIAGNOSIS — K2100 Gastro-esophageal reflux disease with esophagitis, without bleeding: Secondary | ICD-10-CM

## 2016-10-06 DIAGNOSIS — R634 Abnormal weight loss: Secondary | ICD-10-CM

## 2016-10-06 MED ORDER — PANTOPRAZOLE SODIUM 40 MG PO TBEC: 40.0000 mg | DELAYED_RELEASE_TABLET | ORAL | 1 refills | Status: DC

## 2016-10-06 NOTE — Patient Instructions (Addendum)
Notify if Colace stops working. Notify if heartburn not controlled with every other Pantoprazole. Follow-up with Dr. [name] regarding right knee problem.

## 2016-10-06 NOTE — Progress Notes (Signed)
Presenting complaint;  Follow-up for lymphocytic colitis/diarrhea. Loss and GERD.  Subjective:  Patient is 83 year old Caucasian female who is here for scheduled visit accompanied by his daughter and. She was last seen on 04/07/2016. She has gained 7 pounds is that visit. She is not having diarrhea anymore. If anything she feels constipated at times. She is taking Colace and it is helping. She generally has 1 formed stool daily. She says her appetite is very good. She also drinks 2 cans of an sure every day. She denies nausea vomiting abdominal pain melena or rectal bleeding. Other past few months she has been living with her daughter [name]. She complains of right knee pain. Her daughter states this is the first time she has voiced this symptom. She fell 1 week ago at home did not sustain any injuries. She takes 1 or 2 pills of Aleve per week for back pain.    Current Medications: Outpatient Encounter Prescriptions as of 10/06/2016  Medication Sig  . albuterol (PROVENTIL HFA;VENTOLIN HFA) 108 (90 BASE) MCG/ACT inhaler Inhale 2 puffs into the lungs every 6 (six) hours as needed for wheezing or shortness of breath.  Marland Kitchen atorvastatin (LIPITOR) 80 MG tablet Take 1 tablet (80 mg total) by mouth daily at 6 PM. (Patient taking differently: Take 40 mg by mouth daily. TAKES 0.5 TABLET)  . budesonide-formoterol (SYMBICORT) 160-4.5 MCG/ACT inhaler Inhale 2 puffs into the lungs 2 (two) times daily.  . citalopram (CELEXA) 10 MG tablet Take 1 tablet (10 mg total) by mouth daily.  Marland Kitchen docusate sodium (COLACE) 100 MG capsule Take 200 mg by mouth daily as needed for mild constipation.  . feeding supplement, ENSURE ENLIVE, (ENSURE ENLIVE) LIQD Take 237 mLs by mouth 2 (two) times daily between meals.  . Ferrous Sulfate 27 MG TABS Take 27 mg by mouth daily.  Marland Kitchen levETIRAcetam (KEPPRA) 250 MG tablet TAKE ONE TABLET TWICE DAILY  . memantine (NAMENDA XR) 28 MG CP24 24 hr capsule Take 1 capsule (28 mg total) by mouth daily.   . Methylfol-Algae-B12-Acetylcyst (CEREFOLIN NAC) 6-90.314-2-600 MG TABS Take 1 tablet by mouth daily.  . naproxen sodium (ANAPROX) 220 MG tablet Take 440 mg by mouth daily as needed (HEADACHE).  . pantoprazole (PROTONIX) 40 MG tablet TAKE ONE TABLET ONCE DAILY WITH BREAKFAST.  Marland Kitchen Potassium 99 MG TABS Take 99 mg by mouth daily.  Marland Kitchen SPIRIVA HANDIHALER 18 MCG inhalation capsule Place 1 capsule into inhaler and inhale daily.  . VESICARE 10 MG tablet Take 5 mg by mouth daily. TAKES 0.5 TABLET  . [DISCONTINUED] budesonide (ENTOCORT EC) 3 MG 24 hr capsule Take 6 mg by mouth daily as needed (diarrhea due to colitis.).   . [DISCONTINUED] loperamide (IMODIUM A-D) 2 MG tablet Take 2 mg by mouth 4 (four) times daily as needed for diarrhea or loose stools.  . [DISCONTINUED] meloxicam (MOBIC) 7.5 MG tablet Take 7.5 mg by mouth daily.  . [DISCONTINUED] rivaroxaban (XARELTO) 20 MG TABS tablet Take 1 tablet (20 mg total) by mouth daily with supper. (Patient not taking: Reported on 07/02/2016)  . [DISCONTINUED] sulfamethoxazole-trimethoprim (BACTRIM DS,SEPTRA DS) 800-160 MG tablet Take 1 tablet by mouth 2 (two) times daily. (Patient not taking: Reported on 10/06/2016)   No facility-administered encounter medications on file as of 10/06/2016.      Objective: Blood pressure 100/64, pulse 70, temperature 98 F (36.7 C), temperature source Oral, weight 100 lb 7 oz (45.6 kg). Patient is alert and in no acute distress. Conjunctiva is pink. Sclera is nonicteric Oropharyngeal  mucosa is normal. No neck masses or thyromegaly noted. Cardiac exam with regular rhythm normal S1 and S2. No murmur or gallop noted. Lungs are clear to auscultation. Abdomen is flat soft and nontender without organomegaly or masses. No LE edema or clubbing noted. She has increased pigmentation of skin of both legs.   Assessment:  #1. History of lymphocytic colitis. It appears she is in remission since she is now having normal bowel movements  and prone to constipation. She should continue Colace on schedule and use Dulcolax suppository on as-needed basis.  #2. Weight loss. She has gained 7 pounds since her last visit which is very reassuring. Weight gain appears to be due to improved oral intake since she is living with her daughter.  #3. GERD. She is doing well with daily PPI. Therefore dose could be decreased.   Plan:  Decrease pantoprazole to every other day. Take Colace 100 or 200 mg by mouth daily at bedtime. Notify if pantoprazole every other day not effective in controlling heartburn. Can use Dulcolax suppository on as-needed basis for constipation. Follow-up with Dr. [name] regarding right knee pain. Office visit in 6 months.

## 2016-10-12 ENCOUNTER — Emergency Department (HOSPITAL_COMMUNITY): Payer: Medicare Other

## 2016-10-12 ENCOUNTER — Observation Stay (HOSPITAL_COMMUNITY): Payer: Medicare Other

## 2016-10-12 ENCOUNTER — Observation Stay (HOSPITAL_COMMUNITY)
Admission: EM | Admit: 2016-10-12 | Discharge: 2016-10-14 | Disposition: A | Discharge status: Home / Self Care | Payer: Medicare Other | Attending: Internal Medicine | Admitting: Internal Medicine

## 2016-10-12 ENCOUNTER — Encounter (HOSPITAL_COMMUNITY): Payer: Self-pay | Admitting: *Deleted

## 2016-10-12 DIAGNOSIS — E46 Unspecified protein-calorie malnutrition: Secondary | ICD-10-CM | POA: Diagnosis not present

## 2016-10-12 DIAGNOSIS — N1330 Unspecified hydronephrosis: Secondary | ICD-10-CM | POA: Insufficient documentation

## 2016-10-12 DIAGNOSIS — N261 Atrophy of kidney (terminal): Secondary | ICD-10-CM | POA: Diagnosis not present

## 2016-10-12 DIAGNOSIS — M1990 Unspecified osteoarthritis, unspecified site: Secondary | ICD-10-CM | POA: Insufficient documentation

## 2016-10-12 DIAGNOSIS — I609 Nontraumatic subarachnoid hemorrhage, unspecified: Secondary | ICD-10-CM | POA: Insufficient documentation

## 2016-10-12 DIAGNOSIS — E785 Hyperlipidemia, unspecified: Secondary | ICD-10-CM | POA: Insufficient documentation

## 2016-10-12 DIAGNOSIS — G9341 Metabolic encephalopathy: Secondary | ICD-10-CM | POA: Insufficient documentation

## 2016-10-12 DIAGNOSIS — Z7901 Long term (current) use of anticoagulants: Secondary | ICD-10-CM | POA: Diagnosis not present

## 2016-10-12 DIAGNOSIS — I724 Aneurysm of artery of lower extremity: Secondary | ICD-10-CM | POA: Insufficient documentation

## 2016-10-12 DIAGNOSIS — K5900 Constipation, unspecified: Secondary | ICD-10-CM | POA: Diagnosis not present

## 2016-10-12 DIAGNOSIS — R569 Unspecified convulsions: Secondary | ICD-10-CM

## 2016-10-12 DIAGNOSIS — G40909 Epilepsy, unspecified, not intractable, without status epilepticus: Secondary | ICD-10-CM | POA: Diagnosis not present

## 2016-10-12 DIAGNOSIS — Z85828 Personal history of other malignant neoplasm of skin: Secondary | ICD-10-CM | POA: Insufficient documentation

## 2016-10-12 DIAGNOSIS — Z79899 Other long term (current) drug therapy: Secondary | ICD-10-CM | POA: Diagnosis not present

## 2016-10-12 DIAGNOSIS — Z905 Acquired absence of kidney: Secondary | ICD-10-CM | POA: Insufficient documentation

## 2016-10-12 DIAGNOSIS — Z7982 Long term (current) use of aspirin: Secondary | ICD-10-CM | POA: Diagnosis not present

## 2016-10-12 DIAGNOSIS — Z8673 Personal history of transient ischemic attack (TIA), and cerebral infarction without residual deficits: Secondary | ICD-10-CM | POA: Diagnosis not present

## 2016-10-12 DIAGNOSIS — F0390 Unspecified dementia without behavioral disturbance: Secondary | ICD-10-CM | POA: Insufficient documentation

## 2016-10-12 DIAGNOSIS — R338 Other retention of urine: Secondary | ICD-10-CM | POA: Diagnosis not present

## 2016-10-12 DIAGNOSIS — R339 Retention of urine, unspecified: Secondary | ICD-10-CM | POA: Diagnosis not present

## 2016-10-12 DIAGNOSIS — K219 Gastro-esophageal reflux disease without esophagitis: Secondary | ICD-10-CM | POA: Insufficient documentation

## 2016-10-12 DIAGNOSIS — G459 Transient cerebral ischemic attack, unspecified: Secondary | ICD-10-CM | POA: Diagnosis not present

## 2016-10-12 DIAGNOSIS — Z880 Allergy status to penicillin: Secondary | ICD-10-CM | POA: Diagnosis not present

## 2016-10-12 DIAGNOSIS — R42 Dizziness and giddiness: Secondary | ICD-10-CM | POA: Diagnosis not present

## 2016-10-12 DIAGNOSIS — J42 Unspecified chronic bronchitis: Secondary | ICD-10-CM

## 2016-10-12 DIAGNOSIS — J449 Chronic obstructive pulmonary disease, unspecified: Secondary | ICD-10-CM | POA: Diagnosis not present

## 2016-10-12 DIAGNOSIS — Z681 Body mass index (BMI) 19 or less, adult: Secondary | ICD-10-CM | POA: Insufficient documentation

## 2016-10-12 DIAGNOSIS — Z86718 Personal history of other venous thrombosis and embolism: Secondary | ICD-10-CM | POA: Insufficient documentation

## 2016-10-12 DIAGNOSIS — M25561 Pain in right knee: Secondary | ICD-10-CM | POA: Diagnosis not present

## 2016-10-12 DIAGNOSIS — I252 Old myocardial infarction: Secondary | ICD-10-CM | POA: Diagnosis not present

## 2016-10-12 DIAGNOSIS — J441 Chronic obstructive pulmonary disease with (acute) exacerbation: Secondary | ICD-10-CM | POA: Diagnosis present

## 2016-10-12 DIAGNOSIS — Z87891 Personal history of nicotine dependence: Secondary | ICD-10-CM | POA: Insufficient documentation

## 2016-10-12 LAB — URINALYSIS, ROUTINE W REFLEX MICROSCOPIC
Bacteria, UA: NONE SEEN
Bilirubin Urine: NEGATIVE
Glucose, UA: NEGATIVE mg/dL
Hgb urine dipstick: NEGATIVE
Ketones, ur: NEGATIVE mg/dL
Nitrite: NEGATIVE
Protein, ur: NEGATIVE mg/dL
Specific Gravity, Urine: 1.01 (ref 1.005–1.030)
pH: 7 (ref 5.0–8.0)

## 2016-10-12 LAB — CBC WITH DIFFERENTIAL/PLATELET
Basophils Absolute: 0 10*3/uL (ref 0.0–0.1)
Basophils Relative: 0 %
Eosinophils Absolute: 0.2 10*3/uL (ref 0.0–0.7)
Eosinophils Relative: 3 %
HCT: 41.3 % (ref 36.0–46.0)
Hemoglobin: 13.3 g/dL (ref 12.0–15.0)
Lymphocytes Relative: 15 %
Lymphs Abs: 1.1 10*3/uL (ref 0.7–4.0)
MCH: 32.5 pg (ref 26.0–34.0)
MCHC: 32.2 g/dL (ref 30.0–36.0)
MCV: 101 fL — ABNORMAL HIGH (ref 78.0–100.0)
Monocytes Absolute: 0.5 10*3/uL (ref 0.1–1.0)
Monocytes Relative: 7 %
Neutro Abs: 5.5 10*3/uL (ref 1.7–7.7)
Neutrophils Relative %: 75 %
Platelets: 196 10*3/uL (ref 150–400)
RBC: 4.09 MIL/uL (ref 3.87–5.11)
RDW: 14.4 % (ref 11.5–15.5)
WBC: 7.3 10*3/uL (ref 4.0–10.5)

## 2016-10-12 LAB — BASIC METABOLIC PANEL
Anion gap: 8 (ref 5–15)
BUN: 19 mg/dL (ref 6–20)
CO2: 29 mmol/L (ref 22–32)
Calcium: 9.4 mg/dL (ref 8.9–10.3)
Chloride: 101 mmol/L (ref 101–111)
Creatinine, Ser: 0.95 mg/dL (ref 0.44–1.00)
GFR calc Af Amer: >60 mL/min (ref >60)
GFR calc non Af Amer: 53 mL/min — ABNORMAL LOW (ref >60)
Glucose, Bld: 78 mg/dL (ref 65–99)
Potassium: 4 mmol/L (ref 3.5–5.1)
Sodium: 138 mmol/L (ref 135–145)

## 2016-10-12 MED ORDER — STROKE: EARLY STAGES OF RECOVERY BOOK
Freq: Once | Status: DC
  Filled 2016-10-12: qty 1

## 2016-10-12 MED ORDER — MEMANTINE HCL ER 28 MG PO CP24
28.0000 mg | ORAL_CAPSULE | Freq: Every day | ORAL | Status: DC
  Administered 2016-10-13 – 2016-10-14 (×2): 28 mg via ORAL
  Filled 2016-10-12 (×2): qty 1

## 2016-10-12 MED ORDER — ATORVASTATIN CALCIUM 40 MG PO TABS
40.0000 mg | ORAL_TABLET | Freq: Every day | ORAL | Status: DC
  Administered 2016-10-13 – 2016-10-14 (×2): 40 mg via ORAL
  Filled 2016-10-12 (×2): qty 1

## 2016-10-12 MED ORDER — SENNOSIDES-DOCUSATE SODIUM 8.6-50 MG PO TABS: 1.0000 | ORAL_TABLET | Freq: Every evening | ORAL | Status: DC | PRN

## 2016-10-12 MED ORDER — ACETAMINOPHEN 650 MG RE SUPP: 650.0000 mg | Freq: Four times a day (QID) | RECTAL | Status: DC | PRN

## 2016-10-12 MED ORDER — BISACODYL 10 MG RE SUPP: 10.0000 mg | Freq: Every day | RECTAL | Status: DC | PRN

## 2016-10-12 MED ORDER — MOMETASONE FURO-FORMOTEROL FUM 200-5 MCG/ACT IN AERO
2.0000 | INHALATION_SPRAY | Freq: Two times a day (BID) | RESPIRATORY_TRACT | Status: DC
  Administered 2016-10-13 – 2016-10-14 (×3): 2 via RESPIRATORY_TRACT
  Filled 2016-10-12: qty 8.8

## 2016-10-12 MED ORDER — ASPIRIN 325 MG PO TABS
325.0000 mg | ORAL_TABLET | Freq: Every day | ORAL | Status: DC
  Administered 2016-10-13 – 2016-10-14 (×2): 325 mg via ORAL
  Filled 2016-10-12 (×2): qty 1

## 2016-10-12 MED ORDER — LEVETIRACETAM 250 MG PO TABS
250.0000 mg | ORAL_TABLET | Freq: Two times a day (BID) | ORAL | Status: DC
  Administered 2016-10-13 – 2016-10-14 (×4): 250 mg via ORAL
  Filled 2016-10-12 (×4): qty 1

## 2016-10-12 MED ORDER — SODIUM CHLORIDE 0.9 % IV SOLN
INTRAVENOUS | Status: DC
  Administered 2016-10-12: 23:00:00 via INTRAVENOUS

## 2016-10-12 MED ORDER — HYDROCODONE-ACETAMINOPHEN 5-325 MG PO TABS: 1.0000 | ORAL_TABLET | ORAL | Status: DC | PRN

## 2016-10-12 MED ORDER — CITALOPRAM HYDROBROMIDE 20 MG PO TABS
10.0000 mg | ORAL_TABLET | Freq: Every day | ORAL | Status: DC
  Administered 2016-10-13 – 2016-10-14 (×2): 10 mg via ORAL
  Filled 2016-10-12 (×2): qty 1

## 2016-10-12 MED ORDER — ASPIRIN 300 MG RE SUPP: 300.0000 mg | Freq: Every day | RECTAL | Status: DC

## 2016-10-12 MED ORDER — ACETAMINOPHEN 325 MG PO TABS: 650.0000 mg | ORAL_TABLET | Freq: Four times a day (QID) | ORAL | Status: DC | PRN

## 2016-10-12 MED ORDER — POLYETHYLENE GLYCOL 3350 17 G PO PACK: 17.0000 g | PACK | Freq: Every day | ORAL | Status: DC | PRN

## 2016-10-12 MED ORDER — ONDANSETRON HCL 4 MG PO TABS: 4.0000 mg | ORAL_TABLET | Freq: Four times a day (QID) | ORAL | Status: DC | PRN

## 2016-10-12 MED ORDER — PANTOPRAZOLE SODIUM 40 MG PO TBEC
40.0000 mg | DELAYED_RELEASE_TABLET | ORAL | Status: DC
  Administered 2016-10-13: 40 mg via ORAL
  Filled 2016-10-12: qty 1

## 2016-10-12 MED ORDER — ONDANSETRON HCL 4 MG/2ML IJ SOLN: 4.0000 mg | Freq: Four times a day (QID) | INTRAMUSCULAR | Status: DC | PRN

## 2016-10-12 NOTE — ED Notes (Signed)
PT ASSISTED TO BATHROOM BY DAUGHTER. PT CONTINUES TO COMPLAINT OF "FULL BLADDER AND INABILITY TO VOID" AFTER URINATING. PT STATES SHE WENT TO BATHROOM A SMALL AMOUNT. BLADDER SCAN POST VOID RESULTS 200MLS

## 2016-10-12 NOTE — ED Notes (Signed)
Bladder Scan - 200 ml

## 2016-10-12 NOTE — ED Notes (Signed)
Post void bladder scan 235ml

## 2016-10-12 NOTE — ED Notes (Signed)
[facility] at bedside for transport. 

## 2016-10-12 NOTE — ED Provider Notes (Signed)
[facility] DEPT Provider Note   CSN: [id number] Arrival date & time: 10/12/16  1209     History   Chief Complaint Chief Complaint  Patient presents with  . Dizziness    HPI KYELLE URBAS is a 83 y.o. female with PMHX including microscopic colitis microscopic colitis, COPD, dementia, hydronephrosis with nephrostomy stent, seizures 2/2 prior SAH who presents today accompanied by daughter with chief complaint acute onset, intermittent lightheadedness and urinary symptoms for 2 days. Patient lives with her daughter but on Saturday 2 days ago she was at her home. Patient's daughter called for her to come into her house but she stated that patient felt dizzy so she went to pick her up. Patient describes the sensation as lightheadedness and not dizziness. She states "this feels different than my usual vertigo ". Patient's daughter gave her meclizine with mild improvement in symptoms. 2 days ago patient also endorses diplopia and blurry vision which have since resolved. She is not lightheaded at this time. No aggravating or alleviating factors noted. Patient also endorses 2 days of urinary frequency and urgency as well as feelings of retention. Denies dysuria, hematuria, abdominal pain, nausea, vomiting, CP, SOB, fevers, chills, headaches, melena, hematochezia. She does have a history of chronic diarrhea, which is unchanged. Patient's daughter states that she has been more confused than usual and repeating more questions although she does have mild dementia at baseline. She ambulates without the assistance of a cane or walker at baseline, however daughter states that she has been holding onto the wall and other objects to steady herself over the past few days.   The history is provided by the patient and a relative.    Past Medical History:  Diagnosis Date  . Anxiety   . Arthritis   . Attention to nephrostomy ([place])    PT HAS NEPHROSTOMY TUBE IN PLACE  . Bruises easily   . Cancer [facility])    Skin cancer  . Chronic diarrhea   . Colitis   . COPD (chronic obstructive pulmonary disease) ([place])   . Dementia   . Depression   . GERD (gastroesophageal reflux disease)   . Hiatal hernia   . Hx of bronchitis   . Hx of pulmonary edema JULY 2015  . Hx of pulmonary edema JULY 2015  . Hx of septic shock JULY 2015  . Hydronephrosis, left   . Hyperlipidemia   . Memory difficulties   . Microscopic colitis   . Myocardial infarction [facility])    unknown time   . Renal insufficiency   . Seizures ([place])    last seizure was 4-5 years ago with "brain bleed"  . Shortness of breath    with exertion  . Stroke [facility])    TIA four years ago / stroke NOV 2014    Patient Active Problem List   Diagnosis Date Noted  . TIA (transient ischemic attack) 10/12/2016  . Acute urinary retention 10/12/2016  . Popliteal artery aneurysm ([place]) 02/05/2016  . Malnutrition of moderate degree 12/15/2015  . DVT of popliteal vein ([place]) 12/14/2015  . 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 Sepsis affecting skin ([place]) 12/13/2015  . Dementia 12/13/2015  . GERD (gastroesophageal reflux disease) 12/13/2015  . Severe protein-calorie malnutrition ([place]) 12/13/2015  . Cellulitis of right leg 11/24/2015  . Dyslipidemia 11/24/2015  . Erythema of lower extremity 11/24/2015  . Anemia 11/01/2015  . Esophageal reflux 08/17/2013  . COPD (chronic obstructive pulmonary disease) ([place]) 08/11/2013  . NSTEMI (non-ST elevated  myocardial infarction) type 2 07/26/2013  . Dehydration 07/25/2013  . AKI (acute kidney injury) ([place]) 07/25/2013  . Colitis 07/08/2013  . Hydronephrosis, left 07/08/2013  . Microscopic colitis 06/27/2013  . Memory loss 04/25/2013  . Depression 04/25/2013  . SAH (subarachnoid hemorrhage) ([place]) 11/22/2012  . Seizure disorder secondary to prior [facility] 11/22/2012    Past Surgical History:  Procedure Laterality Date  . APPENDECTOMY    . BREAST BIOPSY Right 02/02/2014   Procedure: RIGHT BREAST  BIOPSY;  Surgeon: Jamesetta So, MD;  Location: [facility] ORS;  Service: General;  Laterality: Right;  . COLONOSCOPY N/A 05/24/2013   Procedure: COLONOSCOPY;  Surgeon: Rogene Houston, MD;  Location: [facility] ENDO SUITE;  Service: Endoscopy;  Laterality: N/A;  100  . CYSTOSCOPY W/ URETERAL STENT PLACEMENT Left 08/06/2015   Procedure: CYSTOSCOPY WITH LEFT URETERAL STENT EXCHANGE - Sammie Bench;  Surgeon: Franchot Gallo, MD;  Location: [facility] ORS;  Service: Urology;  Laterality: Left;  . CYSTOSCOPY W/ URETERAL STENT PLACEMENT Left 07/07/2016   Procedure: CYSTOSCOPY, LEFT RETROGRADE PYELOGRAM WITH LEFT URETERAL STENT REPLACEMENT;  Surgeon: Franchot Gallo, MD;  Location: [facility] ORS;  Service: Urology;  Laterality: Left;  . CYSTOSCOPY WITH RETROGRADE PYELOGRAM, URETEROSCOPY AND STENT PLACEMENT Left 09/18/2013   Procedure: CYSTOSCOPY WITH RETROGRADE PYELOGRAM,  URETEROSCOPY AND  STENT PLACEMENT, removal of nephrostomy tube;  Surgeon: Jorja Loa, MD;  Location: [facility] ORS;  Service: Urology;  Laterality: Left;  . CYSTOSCOPY WITH STENT PLACEMENT Left 09/04/2014   Procedure: CYSTOSCOPY WITH LEFT J2 STENT EXCHANGE;  Surgeon: Franchot Gallo, MD;  Location: [facility] ORS;  Service: Urology;  Laterality: Left;  . ENDOVASCULAR REPAIR OF POPLITEAL ARTERY ANEURYSM  02/05/2016  . ESOPHAGOGASTRODUODENOSCOPY N/A 05/24/2013   Procedure: ESOPHAGOGASTRODUODENOSCOPY (EGD);  Surgeon: Rogene Houston, MD;  Location: [facility];  Service: Endoscopy;  Laterality: N/A;  . FALSE ANEURYSM REPAIR Right 02/05/2016   Procedure: REPAIR RIGHT POLITEAL  ANEURYSM;  Surgeon: Serafina Mitchell, MD;  Location: [facility];  Service: Vascular;  Laterality: Right;  . KNEE ARTHROSCOPY Right   . SKIN CANCER EXCISION    . TONSILLECTOMY    . TUBAL LIGATION      OB History    Gravida Para Term Preterm AB Living   5 5 5     5    SAB TAB Ectopic Multiple Live Births                   Home Medications    Prior to Admission medications   Medication Sig Start Date  End Date Taking? Authorizing Provider  albuterol (PROVENTIL HFA;VENTOLIN HFA) 108 (90 BASE) MCG/ACT inhaler Inhale 2 puffs into the lungs every 6 (six) hours as needed for wheezing or shortness of breath. 08/03/13  Yes Short, Noah Delaine, MD  atorvastatin (LIPITOR) 80 MG tablet Take 1 tablet (80 mg total) by mouth daily at 6 PM. Patient taking differently: Take 40 mg by mouth daily. TAKES 0.5 TABLET 08/03/13  Yes Short, Noah Delaine, MD  budesonide-formoterol [facility]) 160-4.5 MCG/ACT inhaler Inhale 2 puffs into the lungs 2 (two) times daily. 06/13/14  Yes Samuella Cota, MD  citalopram (CELEXA) 10 MG tablet Take 1 tablet (10 mg total) by mouth daily. 05/05/16  Yes Garvin Fila, MD  docusate sodium (COLACE) 100 MG capsule Take 200 mg by mouth daily.    Yes [provider]  feeding supplement, ENSURE ENLIVE, (ENSURE ENLIVE) LIQD Take 237 mLs by mouth 2 (two) times daily between meals. 12/22/15  Yes Hosie Poisson, MD  Ferrous Sulfate 27 MG TABS Take 27 mg by mouth daily.   Yes [provider]  levETIRAcetam (KEPPRA) 250 MG tablet TAKE ONE TABLET TWICE DAILY 04/27/16  Yes Garvin Fila, MD  meloxicam (MOBIC) 7.5 MG tablet Take 7.5 mg by mouth 2 (two) times daily. 10/06/16  Yes [provider]  memantine (NAMENDA XR) 28 MG CP24 24 hr capsule Take 1 capsule (28 mg total) by mouth daily. 09/08/16  Yes Garvin Fila, MD  Methylfol-Algae-B12-Acetylcyst (CEREFOLIN NAC) 6-90.314-2-600 MG TABS Take 1 tablet by mouth daily. 12/02/15  Yes Garvin Fila, MD  naproxen sodium (ANAPROX) 220 MG tablet Take 440 mg by mouth daily as needed (HA, back pain).    Yes [provider]  pantoprazole (PROTONIX) 40 MG tablet Take 1 tablet (40 mg total) by mouth every other day. 10/06/16  Yes Rehman, Mechele Dawley, MD  Potassium 99 MG TABS Take 99 mg by mouth daily.   Yes [provider]  SPIRIVA HANDIHALER 18 MCG inhalation capsule Place 1 capsule into inhaler and inhale daily. 05/20/14  Yes  [provider]  VESICARE 10 MG tablet Take 5 mg by mouth daily.  01/07/16  Yes [provider]    Family History Family History  Problem Relation Age of Onset  . Diabetes Mother   . Stroke Mother   . Heart attack Father     Social History Social History  Substance Use Topics  . Smoking status: Former Smoker    Packs/day: 1.00    Years: 50.00    Types: Cigarettes    Quit date: 11/13/1997  . Smokeless tobacco: Never Used  . Alcohol use 4.2 oz/week    7 Glasses of wine per week     Comment: 2 glasses a night wine (none in a year)     Allergies   Penicillins   Review of Systems Review of Systems  Constitutional: Negative for chills and fever.  Eyes: Positive for visual disturbance (resolved).  Respiratory: Negative for shortness of breath.   Cardiovascular: Negative for chest pain.  Gastrointestinal: Negative for abdominal pain, nausea and vomiting.  Genitourinary: Positive for decreased urine volume, frequency and urgency. Negative for dysuria, flank pain and hematuria.     Physical Exam Updated Vital Signs BP 113/63 (BP Location: Left Arm)   Pulse 88   Temp 97.7 F (36.5 C) (Oral)   Resp 18   Ht 5\' 1"  (1.549 m)   Wt 39 kg (85 lb 14.4 oz)   SpO2 95%   BMI 16.23 kg/m   Physical Exam  Constitutional: She appears well-developed and well-nourished. No distress.  HENT:  Head: Normocephalic and atraumatic.  Right Ear: External ear normal.  Left Ear: External ear normal.  Eyes: Pupils are equal, round, and reactive to light. Conjunctivae and EOM are normal. Right eye exhibits no discharge. Left eye exhibits no discharge.  Neck: Normal range of motion. Neck supple. No JVD present. No tracheal deviation present.  No midline spine TTP, no paraspinal muscle tenderness, no deformity, crepitus, or step-off noted   Cardiovascular: Normal rate, regular rhythm and intact distal pulses.   Distant heart sounds, 2+ radial and DP/PT pulses bl, negative  Homan's bl  Pulmonary/Chest: Effort normal and breath sounds normal. She exhibits no tenderness.  Globally diminished breath sounds, equal rise and fall of chest, no increased work of breathing  Abdominal: Soft. Bowel sounds are normal. She exhibits no distension. There is no tenderness.  Musculoskeletal: She exhibits tenderness. She exhibits no  edema.  5/5 strength of BLE and BLE major muscle groups. No midline spine TTP, no paraspinal muscle tenderness, no deformity, crepitus, or step-off noted. Right knee with mild tenderness to palpation overlying the MCL and medial joint line. Normal range of motion, no varus or valgus deformity, negative anterior/posterior drawer test. No significant effusion noted. Diffuse ecchymosis to the lower extremities bilaterally, patient states is baseline  Neurological: She is alert. A sensory deficit is present. No cranial nerve deficit. She exhibits normal muscle tone.  Mental Status:  A&O to person and place but not time, thought content appropriate, able to give a mostly coherent history. Speech fluent without evidence of aphasia. Able to follow 2 step commands without difficulty.  Cranial Nerves:  II:  Peripheral visual fields grossly normal, pupils equal, round, reactive to light III,IV, VI: ptosis not present, extra-ocular motions intact bilaterally  V,VII: smile symmetric, facial light touch sensation equal VIII: hearing grossly normal to voice  X: uvula elevates symmetrically  XI: bilateral shoulder shrug symmetric and strong XII: midline tongue extension without fassiculations Motor:  Normal tone. 5/5 strength of BUE and BLE major muscle groups including strong and equal grip strength and dorsiflexion/plantar flexion Sensory: Decreased sensation on the right to soft touch of the face, upper and lower extremities  Cerebellar: normal finger-to-nose with bilateral upper extremities, normal heel-to-shin with BLE Gait: normal gait and balance. Able to walk  on toes and heels with ease.  CV: 2+ radial and DP/PT pulses  No pronator drift, negative Romberg, no nystagmus   Skin: Skin is warm and dry. Capillary refill takes less than 2 seconds. No erythema.  Psychiatric: She has a normal mood and affect. Her behavior is normal.  Nursing note and vitals reviewed.    ED Treatments / Results  Labs (all labs ordered are listed, but only abnormal results are displayed) Labs Reviewed  URINALYSIS, ROUTINE W REFLEX MICROSCOPIC - Abnormal; Notable for the following:       Result Value   Leukocytes, UA SMALL (*)    Squamous Epithelial / LPF 0-5 (*)    All other components within normal limits  BASIC METABOLIC PANEL - Abnormal; Notable for the following:    GFR calc non Af Amer 53 (*)    All other components within normal limits  CBC WITH DIFFERENTIAL/PLATELET - Abnormal; Notable for the following:    MCV 101.0 (*)    All other components within normal limits  URINE CULTURE  TROPONIN I  MAGNESIUM  PHOSPHORUS  TSH  COMPREHENSIVE METABOLIC PANEL  CBC  HEMOGLOBIN A1C  LIPID PANEL  URINALYSIS, DIPSTICK ONLY    EKG  EKG Interpretation  Date/Time:  Monday October 12 2016 15:30:11 EDT Ventricular Rate:  85 PR Interval:    QRS Duration: 104 QT Interval:  417 QTC Calculation: 487 R Axis:   -34 Text Interpretation:  SR with PACs Low voltage, extremity and precordial leads Slow R progression--noted previously Confirmed by Tanna Furry [phone]) on 10/12/2016 3:51:45 PM       Radiology Dg Abd 1 View  Result Date: 10/12/2016 CLINICAL DATA:  Renal stent, vertigo, dizziness, obstipation, COPD, prior MI EXAM: ABDOMEN - 1 VIEW COMPARISON:  06/11/2015 FINDINGS: LEFT ureteral stent. Osseous demineralization. Atherosclerotic calcifications aorta and iliac arteries. Scattered stool and gas in colon. Nonobstructive bowel gas pattern without bowel dilatation or wall thickening. No definite urinary tract calcification. IMPRESSION: No acute abnormalities.  Electronically Signed   By: Lavonia Dana M.D.   On: 10/12/2016 22:14   Ct  Head Wo Contrast  Result Date: 10/12/2016 CLINICAL DATA:  Vertigo and dizziness for 2 days. EXAM: CT HEAD WITHOUT CONTRAST TECHNIQUE: Contiguous axial images were obtained from the base of the skull through the vertex without intravenous contrast. COMPARISON:  11/19/2012 FINDINGS: Brain: Stable age related cerebral atrophy, ventriculomegaly and periventricular white matter disease. No extra-axial fluid collections are identified. No CT findings for acute hemispheric infarction or intracranial hemorrhage. No mass lesions. Stable curvilinear cortical calcifications at the right vertex. The brainstem and cerebellum are normal. Vascular: Stable vascular calcifications. No hyperdense vessels or aneurysm. Skull: No skull fracture or bone lesion. Sinuses/Orbits: The paranasal sinuses and mastoid air cells are clear. The globes are intact. Other: No scalp lesion or hematoma. IMPRESSION: 1. Stable age related cerebral atrophy, ventriculomegaly and periventricular white matter disease. 2. No acute intracranial findings or mass lesions. Electronically Signed   By: Marijo Sanes M.D.   On: 10/12/2016 16:13   US Renal  Result Date: 10/12/2016 CLINICAL DATA:  Urinary retention for 3 days EXAM: RENAL / URINARY TRACT ULTRASOUND COMPLETE COMPARISON:  06/02/2016 FINDINGS: Right Kidney: Length: 9.9 cm. Normal cortical thickness and echogenicity. No mass, hydronephrosis or shadowing calcification. Left Kidney: Length: 7.6 cm. Cortical atrophy. No gross mass or hydronephrosis. No shadowing calcification. Bladder: Decompressed by Foley catheter. Ureteral stent seen on the previous exam is not identified on the current study. IMPRESSION: Atrophic LEFT kidney. Otherwise negative exam. Electronically Signed   By: Lavonia Dana M.D.   On: 10/12/2016 20:16   Dg Knee Complete 4 Views Right  Result Date: 10/12/2016 CLINICAL DATA:  Fall today with generalized  right knee pain. Insert image EXAM: RIGHT KNEE - COMPLETE 4+ VIEW COMPARISON:  None. FINDINGS: No evidence of fracture, dislocation, or joint effusion. Osteopenia and mild degenerative spurring. Atherosclerotic calcification. IMPRESSION: No acute finding. Electronically Signed   By: Monte Fantasia M.D.   On: 10/12/2016 16:20    Procedures Procedures (including critical care time)  Medications Ordered in ED Medications  pantoprazole (PROTONIX) EC tablet 40 mg (not administered)  memantine (NAMENDA XR) 24 hr capsule 28 mg (not administered)  citalopram (CELEXA) tablet 10 mg (not administered)  levETIRAcetam (KEPPRA) tablet 250 mg (250 mg Oral Given 10/13/16 0023)  mometasone-formoterol (DULERA) 200-5 MCG/ACT inhaler 2 puff (not administered)  atorvastatin (LIPITOR) tablet 40 mg (not administered)  acetaminophen (TYLENOL) tablet 650 mg (not administered)    Or  acetaminophen (TYLENOL) suppository 650 mg (not administered)  HYDROcodone-acetaminophen (NORCO/VICODIN) 5-325 MG per tablet 1-2 tablet (not administered)  ondansetron (ZOFRAN) tablet 4 mg (not administered)    Or  ondansetron (ZOFRAN) injection 4 mg (not administered)  polyethylene glycol (MIRALAX / GLYCOLAX) packet 17 g (not administered)  bisacodyl (DULCOLAX) suppository 10 mg (not administered)   stroke: mapping our early stages of recovery book (not administered)  0.9 %  sodium chloride infusion (not administered)  senna-docusate (Senokot-S) tablet 1 tablet (not administered)  aspirin suppository 300 mg (not administered)    Or  aspirin tablet 325 mg (not administered)     Initial Impression / Assessment and Plan / ED Course  I have reviewed the triage vital signs and the nursing notes.  Pertinent labs & imaging results that were available during my care of the patient were reviewed by me and considered in my medical decision making (see chart for details).     Patient with transient vision changes, persisting  lightheadedness and difficulty ambulating for 2 days as well as left sided numbness. Afebrile, vital signs are stable.  She is also complaining of urinary retention. UA is not concerning for UTI, however post void residual is consistently 200 mL; Foley catheter placed. CT head shows stable age related cerebral atrophy, ventriculomegaly and periventricular white matter disease and no acute intracranial findings or mass lesions. On evaluation by Dr. Sherry Ruffing, patient states numbness has resolved. Spoke with neurologist Dr. Malen Gauze, who recommends admission for TIA versus stroke evaluation. Recommends transfer to [facility] for further workup. Spoke with hospitalist Dr. Roel Cluck, who agrees to assume care of patient and bring her into the hospital for further management and workup. We'll also obtain renal ultrasound to evaluate for patient's prior hydronephrosis and urinary symptoms today. Patient seen and evaluated by Dr. Sherry Ruffing, who agrees with assessment and plan at this time.  Final Clinical Impressions(s) / ED Diagnoses   Final diagnoses:  Obstipation  TIA (transient ischemic attack)    New Prescriptions Current Discharge Medication List       Debroah Baller 10/13/16 [id number]    Tegeler, Gwenyth Allegra, MD 10/13/16 1753

## 2016-10-12 NOTE — ED Notes (Signed)
Patient transported to X-ray 

## 2016-10-12 NOTE — Progress Notes (Signed)
Pt arrived to unit from [facility] via EMS. No c/o pain or distress at this time. Oriented to unit. Will continue to monitor.

## 2016-10-12 NOTE — ED Notes (Signed)
ED Provider at bedside.TEGELER 

## 2016-10-12 NOTE — ED Triage Notes (Signed)
Pt complains of vertigo, dizziness x 2 days. Pt has stent in right kidney, daughter thinks the pt's stent is not emptying completely. Pt states "I feel drunk as a skunk".

## 2016-10-12 NOTE — H&P (Addendum)
Bridget Ball FMB:[id number] DOB: 18-Jul-1928 DOA: 10/12/2016     PCP: Celene Squibb, MD   Outpatient Specialists:   Neurology Leonie Man, Urology [facility] [place] Patient coming from:   home Lives   With family    Chief Complaint: vertigo  HPI: Bridget Ball is a 83 y.o. female with medical history significant of right popliteal aneurysm status post repair history of DVT on Xarelto, lymphocytic colitis followed by GI history of subarachnoid bleed resulting in seizures  COPD, dementia, hydronephrosis with nephrostomy stent  Presented with 2 day history of intermittent vertigo and confusion trouble of ambulation. She states this feels different from her usual vertigo. Currently asymptomatic she have had some urinary frequency urgency and difficulty emptying her bladder. In ER postvoid residual 200 ml. But very symptomatic. No abdominal pain no nausea vomiting or chest pain shortness of breath no fevers or chills At her baseline patient has mild dementia but has been more confused lately she also usually at baseline able to walk now has trouble and has to hold onto walls. Family states she was very unbalanced.   It was some possible numbness on the numbness on the left side.  Regarding pertinent Chronic problems: History of seizure disorder on Keppra history of right DVT secondary to popliteal aneurysm causing compression of femoral vein  Anticoagulation has been DC's few months ago.    IN ER:  Temp (24hrs), Avg:98 F (36.7 C), Min:97.8 F (36.6 C), Max:98.2 F (36.8 C)      on arrival  ED Triage Vitals  Enc Vitals Group     BP 10/12/16 1221 121/77     Pulse Rate 10/12/16 1221 78     Resp 10/12/16 1221 18     Temp 10/12/16 1221 98.1 F (36.7 C)     Temp Source 10/12/16 1221 Oral     SpO2 10/12/16 1221 94 %     Weight --      Height --      Head Circumference --      Peak Flow --      Pain Score 10/12/16 1656 0     Pain Loc --      Pain Edu? --      Excl. in [place]? --      Latest 18  99%  Hr 85 BP122/90 Na 138 K 4.0  WBC 7.3 Hg 13.3 plt 196  Following Medications were ordered in ER: Medications - No data to display   ER provider discussed case with:Neurology Who recommends: Transfer to [place]: A workup for TIA/CVA We'll see patient in consult tonight   Hospitalist was called for admission for Possible TIA and symptomatic urinary retetnion  Review of Systems:    Pertinent positives include: gait abnormality, confusion urgency or frequency.  Constitutional:  No weight loss, night sweats, Fevers, chills, fatigue, weight loss  HEENT:  No headaches, Difficulty swallowing,Tooth/dental problems,Sore throat,  No sneezing, itching, ear ache, nasal congestion, post nasal drip,  Cardio-vascular:  No chest pain, Orthopnea, PND, anasarca, dizziness, palpitations.no Bilateral lower extremity swelling  GI:  No heartburn, indigestion, abdominal pain, nausea, vomiting, diarrhea, change in bowel habits, loss of appetite, melena, blood in stool, hematemesis Resp:  no shortness of breath at rest. No dyspnea on exertion, No excess mucus, no productive cough, No non-productive cough, No coughing up of blood.No change in color of mucus.No wheezing. Skin:  no rash or lesions. No jaundice GU:  no dysuria, change in color of urine, no  No straining to urinate.  No flank pain.  Musculoskeletal:  No joint pain or no joint swelling. No decreased range of motion. No back pain.  Psych:  No change in mood or affect. No depression or anxiety. No memory loss.  Neuro:   no tingling, no weakness, no double vision, no  no slurred speech,     As per HPI otherwise 10 point review of systems negative.   Past Medical History: Past Medical History:  Diagnosis Date  . Anxiety   . Arthritis   . Attention to nephrostomy ([place])    PT HAS NEPHROSTOMY TUBE IN PLACE  . Bruises easily   . Cancer [facility])    Skin cancer  . Chronic diarrhea   . Colitis   . COPD (chronic obstructive  pulmonary disease) ([place])   . Dementia   . Depression   . GERD (gastroesophageal reflux disease)   . Hiatal hernia   . Hx of bronchitis   . Hx of pulmonary edema JULY 2015  . Hx of pulmonary edema JULY 2015  . Hx of septic shock JULY 2015  . Hydronephrosis, left   . Hyperlipidemia   . Memory difficulties   . Microscopic colitis   . Myocardial infarction [facility])    unknown time   . Renal insufficiency   . Seizures ([place])    last seizure was 4-5 years ago with "brain bleed"  . Shortness of breath    with exertion  . Stroke [facility])    TIA four years ago / stroke NOV 2014   Past Surgical History:  Procedure Laterality Date  . APPENDECTOMY    . BREAST BIOPSY Right 02/02/2014   Procedure: RIGHT BREAST BIOPSY;  Surgeon: Jamesetta So, MD;  Location: [facility] ORS;  Service: General;  Laterality: Right;  . COLONOSCOPY N/A 05/24/2013   Procedure: COLONOSCOPY;  Surgeon: Rogene Houston, MD;  Location: [facility] ENDO SUITE;  Service: Endoscopy;  Laterality: N/A;  100  . CYSTOSCOPY W/ URETERAL STENT PLACEMENT Left 08/06/2015   Procedure: CYSTOSCOPY WITH LEFT URETERAL STENT EXCHANGE - Sammie Bench;  Surgeon: Franchot Gallo, MD;  Location: [facility] ORS;  Service: Urology;  Laterality: Left;  . CYSTOSCOPY W/ URETERAL STENT PLACEMENT Left 07/07/2016   Procedure: CYSTOSCOPY, LEFT RETROGRADE PYELOGRAM WITH LEFT URETERAL STENT REPLACEMENT;  Surgeon: Franchot Gallo, MD;  Location: [facility] ORS;  Service: Urology;  Laterality: Left;  . CYSTOSCOPY WITH RETROGRADE PYELOGRAM, URETEROSCOPY AND STENT PLACEMENT Left 09/18/2013   Procedure: CYSTOSCOPY WITH RETROGRADE PYELOGRAM,  URETEROSCOPY AND  STENT PLACEMENT, removal of nephrostomy tube;  Surgeon: Jorja Loa, MD;  Location: [facility] ORS;  Service: Urology;  Laterality: Left;  . CYSTOSCOPY WITH STENT PLACEMENT Left 09/04/2014   Procedure: CYSTOSCOPY WITH LEFT J2 STENT EXCHANGE;  Surgeon: Franchot Gallo, MD;  Location: [facility] ORS;  Service: Urology;  Laterality: Left;  . ENDOVASCULAR  REPAIR OF POPLITEAL ARTERY ANEURYSM  02/05/2016  . ESOPHAGOGASTRODUODENOSCOPY N/A 05/24/2013   Procedure: ESOPHAGOGASTRODUODENOSCOPY (EGD);  Surgeon: Rogene Houston, MD;  Location: [facility];  Service: Endoscopy;  Laterality: N/A;  . FALSE ANEURYSM REPAIR Right 02/05/2016   Procedure: REPAIR RIGHT POLITEAL  ANEURYSM;  Surgeon: Serafina Mitchell, MD;  Location: [place];  Service: Vascular;  Laterality: Right;  . KNEE ARTHROSCOPY Right   . SKIN CANCER EXCISION    . TONSILLECTOMY    . TUBAL LIGATION       Social History:  Ambulatory   Independently     reports that she quit smoking about 18 years  ago. Her smoking use included Cigarettes. She has a 50.00 pack-year smoking history. She has never used smokeless tobacco. She reports that she drinks about 4.2 oz of alcohol per week . She reports that she does not use drugs.  Allergies:   Allergies  Allergen Reactions  . Penicillins Anaphylaxis    Throat swelling as a child. Tolerated ceftriaxone 07/29/13.        Family History:   Family History  Problem Relation Age of Onset  . Diabetes Mother   . Stroke Mother   . Heart attack Father     Medications: Prior to Admission medications   Medication Sig Start Date End Date Taking? Authorizing Provider  albuterol (PROVENTIL HFA;VENTOLIN HFA) 108 (90 BASE) MCG/ACT inhaler Inhale 2 puffs into the lungs every 6 (six) hours as needed for wheezing or shortness of breath. 08/03/13  Yes Short, Noah Delaine, MD  atorvastatin (LIPITOR) 80 MG tablet Take 1 tablet (80 mg total) by mouth daily at 6 PM. Patient taking differently: Take 40 mg by mouth daily. TAKES 0.5 TABLET 08/03/13  Yes Short, Noah Delaine, MD  budesonide-formoterol [facility]) 160-4.5 MCG/ACT inhaler Inhale 2 puffs into the lungs 2 (two) times daily. 06/13/14  Yes Samuella Cota, MD  citalopram (CELEXA) 10 MG tablet Take 1 tablet (10 mg total) by mouth daily. 05/05/16  Yes Garvin Fila, MD  docusate sodium (COLACE) 100 MG capsule Take  200 mg by mouth daily.    Yes [provider]  feeding supplement, ENSURE ENLIVE, (ENSURE ENLIVE) LIQD Take 237 mLs by mouth 2 (two) times daily between meals. 12/22/15  Yes Hosie Poisson, MD  Ferrous Sulfate 27 MG TABS Take 27 mg by mouth daily.   Yes [provider]  levETIRAcetam (KEPPRA) 250 MG tablet TAKE ONE TABLET TWICE DAILY 04/27/16  Yes Garvin Fila, MD  meloxicam (MOBIC) 7.5 MG tablet Take 7.5 mg by mouth 2 (two) times daily. 10/06/16  Yes [provider]  memantine (NAMENDA XR) 28 MG CP24 24 hr capsule Take 1 capsule (28 mg total) by mouth daily. 09/08/16  Yes Garvin Fila, MD  Methylfol-Algae-B12-Acetylcyst (CEREFOLIN NAC) 6-90.314-2-600 MG TABS Take 1 tablet by mouth daily. 12/02/15  Yes Garvin Fila, MD  naproxen sodium (ANAPROX) 220 MG tablet Take 440 mg by mouth daily as needed (HA, back pain).    Yes [provider]  pantoprazole (PROTONIX) 40 MG tablet Take 1 tablet (40 mg total) by mouth every other day. 10/06/16  Yes Rehman, Mechele Dawley, MD  Potassium 99 MG TABS Take 99 mg by mouth daily.   Yes [provider]  SPIRIVA HANDIHALER 18 MCG inhalation capsule Place 1 capsule into inhaler and inhale daily. 05/20/14  Yes [provider]  VESICARE 10 MG tablet Take 5 mg by mouth daily.  01/07/16  Yes [provider]    Physical Exam: Patient Vitals for the past 24 hrs:  BP Temp Temp src Pulse Resp SpO2  10/12/16 1808 122/90 - - 85 18 99 %  10/12/16 1804 - 97.8 F (36.6 C) - - - -  10/12/16 1730 136/87 - - 89 16 98 %  10/12/16 1438 (!) 144/84 - - 77 18 99 %  10/12/16 1222 (!) 101/57 98.2 F (36.8 C) Oral 77 16 100 %  10/12/16 1221 121/77 98.1 F (36.7 C) Oral 78 18 94 %    1. General:  in No Acute distress  well  -appearing 2. Psychological: Alert and   Oriented 3. Head/ENT:  Dry Mucous Membranes                          Head Non traumatic, neck supple                           Poor Dentition 4. SKIN:   decreased Skin turgor,  Skin clean Dry and intact no rash 5. Heart: Regular rate and rhythm no Murmur, no Rub or gallop 6. Lungs:  no wheezes or crackles   7. Abdomen: Soft,  non-tender, Non distended  bowel sounds present 8. Lower extremities: no clubbing, cyanosis, or edema 9. Neurologically  strength 5 out of 5 in all 4 extremities cranial nerves II through XII intact 10. MSK: Normal range of motion   body mass index is unknown because there is no height or weight on file.  Labs on Admission:   Labs on Admission: I have personally reviewed following labs and imaging studies  CBC:  Recent Labs Lab 10/12/16 1637  WBC 7.3  NEUTROABS 5.5  HGB 13.3  HCT 41.3  MCV 101.0*  PLT 027   Basic Metabolic Panel:  Recent Labs Lab 10/12/16 1637  NA 138  K 4.0  CL 101  CO2 29  GLUCOSE 78  BUN 19  CREATININE 0.95  CALCIUM 9.4   GFR: Estimated Creatinine Clearance: 30.5 mL/min (by C-G formula based on SCr of 0.95 mg/dL). Liver Function Tests: No results for input(s): AST, ALT, ALKPHOS, BILITOT, PROT, ALBUMIN in the last 168 hours. No results for input(s): LIPASE, AMYLASE in the last 168 hours. No results for input(s): AMMONIA in the last 168 hours. Coagulation Profile: No results for input(s): INR, PROTIME in the last 168 hours. Cardiac Enzymes: No results for input(s): CKTOTAL, CKMB, CKMBINDEX, TROPONINI in the last 168 hours. BNP (last 3 results) No results for input(s): PROBNP in the last 8760 hours. HbA1C: No results for input(s): HGBA1C in the last 72 hours. CBG: No results for input(s): GLUCAP in the last 168 hours. Lipid Profile: No results for input(s): CHOL, HDL, LDLCALC, TRIG, CHOLHDL, LDLDIRECT in the last 72 hours. Thyroid Function Tests: No results for input(s): TSH, T4TOTAL, FREET4, T3FREE, THYROIDAB in the last 72 hours. Anemia Panel: No results for input(s): VITAMINB12, FOLATE, FERRITIN, TIBC, IRON, RETICCTPCT in the last 72 hours. Urine analysis:      Component Value Date/Time   COLORURINE YELLOW 10/12/2016 [place] 10/12/2016 1524   LABSPEC 1.010 10/12/2016 1524   PHURINE 7.0 10/12/2016 1524   GLUCOSEU NEGATIVE 10/12/2016 1524   HGBUR NEGATIVE 10/12/2016 [place] 10/12/2016 1524   KETONESUR NEGATIVE 10/12/2016 1524   PROTEINUR NEGATIVE 10/12/2016 1524   UROBILINOGEN 0.2 07/26/2013 2234   NITRITE NEGATIVE 10/12/2016 1524   LEUKOCYTESUR SMALL (A) 10/12/2016 1524   Sepsis Labs: @LABRCNTIP (procalcitonin:4,lacticidven:4) )No results found for this or any previous visit (from the past 240 hour(s)).    UA   no evidence of UTI    Lab Results  Component Value Date   HGBA1C  01/24/2008    5.7 (NOTE)   The ADA recommends the following therapeutic goal for glycemic   control related to Hgb A1C measurement:   Goal of Therapy:   < 7.0% Hgb A1C   Reference: American Diabetes Association: Clinical Practice   Recommendations 2008, Diabetes Care,  2008, 31:(Suppl 1).    Estimated Creatinine Clearance: 30.5 mL/min (by C-G formula based on SCr of  0.95 mg/dL).  BNP (last 3 results) No results for input(s): PROBNP in the last 8760 hours.   ECG REPORT  Independently reviewed Rate:  85  Rhythm: SR with PAC ST&T Change: No acute ischemic changes   QTC 487   There were no vitals filed for this visit.   Cultures:    Component Value Date/Time   SDES URINE, RANDOM 12/18/2015 0812   SPECREQUEST NONE 12/18/2015 0812   CULT (A) 12/18/2015 0812    >=100,000 COLONIES/mL ESCHERICHIA COLI >=100,000 COLONIES/mL PSEUDOMONAS AERUGINOSA    REPTSTATUS 12/20/2015 FINAL 12/18/2015 1610     Radiological Exams on Admission: Ct Head Wo Contrast  Result Date: 10/12/2016 CLINICAL DATA:  Vertigo and dizziness for 2 days. EXAM: CT HEAD WITHOUT CONTRAST TECHNIQUE: Contiguous axial images were obtained from the base of the skull through the vertex without intravenous contrast. COMPARISON:  11/19/2012 FINDINGS: Brain:  Stable age related cerebral atrophy, ventriculomegaly and periventricular white matter disease. No extra-axial fluid collections are identified. No CT findings for acute hemispheric infarction or intracranial hemorrhage. No mass lesions. Stable curvilinear cortical calcifications at the right vertex. The brainstem and cerebellum are normal. Vascular: Stable vascular calcifications. No hyperdense vessels or aneurysm. Skull: No skull fracture or bone lesion. Sinuses/Orbits: The paranasal sinuses and mastoid air cells are clear. The globes are intact. Other: No scalp lesion or hematoma. IMPRESSION: 1. Stable age related cerebral atrophy, ventriculomegaly and periventricular white matter disease. 2. No acute intracranial findings or mass lesions. Electronically Signed   By: Marijo Sanes M.D.   On: 10/12/2016 16:13   Dg Knee Complete 4 Views Right  Result Date: 10/12/2016 CLINICAL DATA:  Fall today with generalized right knee pain. Insert image EXAM: RIGHT KNEE - COMPLETE 4+ VIEW COMPARISON:  None. FINDINGS: No evidence of fracture, dislocation, or joint effusion. Osteopenia and mild degenerative spurring. Atherosclerotic calcification. IMPRESSION: No acute finding. Electronically Signed   By: Monte Fantasia M.D.   On: 10/12/2016 16:20    Chart has been reviewed    Assessment/Plan  83 y.o. female with medical history significant of right popliteal aneurysm status post repair history of DVT on Xarelto, lymphocytic colitis followed by GI history of subarachnoid bleed resulting in seizures  COPD, dementia, hydronephrosis with nephrostomy stent  Admitted for possible TIA  Present on Admission: . TIA (transient ischemic attack) -  - will admit based on TIA/CVA protocol, await results of MRA/MRI, Carotid Doppler and Echo, obtain cardiac enzymes,  ECG,   Lipid panel, TSH. Order PT/OT evaluation. Will make sure patient is on antiplatelet agent.   Neurology consulted.   Request transfer to [facility]   . COPD  (chronic obstructive pulmonary disease) ([place]) stable restart home medications . Dementia and is stable patient is alert and oriented we will continue home medications . GERD (gastroesophageal reflux disease) stable continue home medications . Hydronephrosis, left - currently improved ultrasound did not visualize stent will order KUB  . Acute urinary retention - Hold Vesicare, placed Foley if does not improve will need urology consult in AM history of hydronephrosis currently not visualized renal ultrasound, ordred KUB History of seizure disorder  -continue Keppra  Other plan as per orders.  DVT prophylaxis:  SCD      Code Status:  FULL CODE  as per patient    Family Communication:   Family   at  Bedside  plan of care was discussed with   Daughter,  Disposition Plan:      To home once workup is complete  and patient is stable                         Consults called: Neurology  Admission status:    obs   Level of care    tele          I have spent a total of 56 min on this admission   Bridget Ball 10/12/2016, 9:52 PM    [facility]  Pager [phone]   after 2 AM please page floor coverage PA If 7AM-7PM, please contact the day team taking care of the patient  [email or web address]  Password [email or web address]

## 2016-10-12 NOTE — ED Notes (Addendum)
FAMILY AND PT MADE AWARE

## 2016-10-12 NOTE — ED Notes (Signed)
Patient transported to CT 

## 2016-10-12 NOTE — ED Notes (Addendum)
DAUGHTER STATES PT HAS BEEN EXPERIENCES DIZZINESS FOR SEVERAL DAYS AND RESPONDED TO MECLIZINE GIVEN OVER THE PAST SEVERAL DAYS. DAUGHTER STATES SHE DID NOT GIVE MECLIZINE TODAY. PT STATES SHE FELT LIKE SHE WAS "SPACEY'. PT STATES SHE DOES NOT HAVE ANY NUMBNESS AT THIS TIME HOWEVER STATES HAD NUMBNESS EARLIER

## 2016-10-13 ENCOUNTER — Observation Stay (HOSPITAL_COMMUNITY): Payer: Medicare Other

## 2016-10-13 ENCOUNTER — Ambulatory Visit: Payer: Medicare Other | Admitting: Urology

## 2016-10-13 ENCOUNTER — Observation Stay (HOSPITAL_BASED_OUTPATIENT_CLINIC_OR_DEPARTMENT_OTHER): Payer: Medicare Other

## 2016-10-13 DIAGNOSIS — I6200 Nontraumatic subdural hemorrhage, unspecified: Secondary | ICD-10-CM | POA: Diagnosis not present

## 2016-10-13 DIAGNOSIS — R569 Unspecified convulsions: Secondary | ICD-10-CM | POA: Diagnosis not present

## 2016-10-13 DIAGNOSIS — F0390 Unspecified dementia without behavioral disturbance: Secondary | ICD-10-CM | POA: Diagnosis not present

## 2016-10-13 DIAGNOSIS — J449 Chronic obstructive pulmonary disease, unspecified: Secondary | ICD-10-CM | POA: Diagnosis not present

## 2016-10-13 DIAGNOSIS — G459 Transient cerebral ischemic attack, unspecified: Secondary | ICD-10-CM

## 2016-10-13 DIAGNOSIS — N1330 Unspecified hydronephrosis: Secondary | ICD-10-CM | POA: Diagnosis not present

## 2016-10-13 DIAGNOSIS — I639 Cerebral infarction, unspecified: Secondary | ICD-10-CM | POA: Diagnosis not present

## 2016-10-13 DIAGNOSIS — R42 Dizziness and giddiness: Secondary | ICD-10-CM | POA: Diagnosis not present

## 2016-10-13 DIAGNOSIS — R270 Ataxia, unspecified: Secondary | ICD-10-CM | POA: Diagnosis not present

## 2016-10-13 DIAGNOSIS — Z86718 Personal history of other venous thrombosis and embolism: Secondary | ICD-10-CM | POA: Diagnosis not present

## 2016-10-13 DIAGNOSIS — J42 Unspecified chronic bronchitis: Secondary | ICD-10-CM | POA: Diagnosis not present

## 2016-10-13 DIAGNOSIS — I609 Nontraumatic subarachnoid hemorrhage, unspecified: Secondary | ICD-10-CM | POA: Diagnosis not present

## 2016-10-13 DIAGNOSIS — G40909 Epilepsy, unspecified, not intractable, without status epilepticus: Secondary | ICD-10-CM | POA: Diagnosis not present

## 2016-10-13 DIAGNOSIS — R338 Other retention of urine: Secondary | ICD-10-CM | POA: Diagnosis not present

## 2016-10-13 DIAGNOSIS — K219 Gastro-esophageal reflux disease without esophagitis: Secondary | ICD-10-CM | POA: Diagnosis not present

## 2016-10-13 LAB — HEMOGLOBIN A1C
HEMOGLOBIN A1C: 5.5 % (ref 4.8–5.6)
MEAN PLASMA GLUCOSE: 111.15 mg/dL

## 2016-10-13 LAB — CBC
HEMATOCRIT: 37.8 % (ref 36.0–46.0)
Hemoglobin: 12.1 g/dL (ref 12.0–15.0)
MCH: 32 pg (ref 26.0–34.0)
MCHC: 32 g/dL (ref 30.0–36.0)
MCV: 100 fL (ref 78.0–100.0)
PLATELETS: 179 10*3/uL (ref 150–400)
RBC: 3.78 MIL/uL — ABNORMAL LOW (ref 3.87–5.11)
RDW: 14.2 % (ref 11.5–15.5)
WBC: 5.9 10*3/uL (ref 4.0–10.5)

## 2016-10-13 LAB — COMPREHENSIVE METABOLIC PANEL
ALT: 27 U/L (ref 14–54)
AST: 31 U/L (ref 15–41)
Albumin: 3.4 g/dL — ABNORMAL LOW (ref 3.5–5.0)
Alkaline Phosphatase: 44 U/L (ref 38–126)
Anion gap: 6 (ref 5–15)
BUN: 19 mg/dL (ref 6–20)
CO2: 30 mmol/L (ref 22–32)
Calcium: 9.1 mg/dL (ref 8.9–10.3)
Chloride: 102 mmol/L (ref 101–111)
Creatinine, Ser: 0.89 mg/dL (ref 0.44–1.00)
GFR calc Af Amer: >60 mL/min (ref >60)
GFR calc non Af Amer: 57 mL/min — ABNORMAL LOW (ref >60)
Glucose, Bld: 127 mg/dL — ABNORMAL HIGH (ref 65–99)
Potassium: 3.9 mmol/L (ref 3.5–5.1)
Sodium: 138 mmol/L (ref 135–145)
Total Bilirubin: 0.7 mg/dL (ref 0.3–1.2)
Total Protein: 5.6 g/dL — ABNORMAL LOW (ref 6.5–8.1)

## 2016-10-13 LAB — URINALYSIS, DIPSTICK ONLY
Bilirubin Urine: NEGATIVE
Glucose, UA: NEGATIVE mg/dL
Ketones, ur: 5 mg/dL — AB
NITRITE: NEGATIVE
PROTEIN: 30 mg/dL — AB
SPECIFIC GRAVITY, URINE: 1.019 (ref 1.005–1.030)
pH: 6 (ref 5.0–8.0)

## 2016-10-13 LAB — LIPID PANEL
Cholesterol: 147 mg/dL (ref 0–200)
HDL: 75 mg/dL (ref >40)
LDL CALC: 59 mg/dL (ref 0–99)
Total CHOL/HDL Ratio: 2 RATIO
Triglycerides: 66 mg/dL (ref <150)
VLDL: 13 mg/dL (ref 0–40)

## 2016-10-13 LAB — TROPONIN I

## 2016-10-13 LAB — MAGNESIUM: MAGNESIUM: 2 mg/dL (ref 1.7–2.4)

## 2016-10-13 LAB — TSH: TSH: 0.984 u[IU]/mL (ref 0.350–4.500)

## 2016-10-13 LAB — PHOSPHORUS: Phosphorus: 3.2 mg/dL (ref 2.5–4.6)

## 2016-10-13 MED ORDER — TAMSULOSIN HCL 0.4 MG PO CAPS
0.4000 mg | ORAL_CAPSULE | Freq: Every day | ORAL | Status: DC
  Administered 2016-10-14: 0.4 mg via ORAL
  Filled 2016-10-13: qty 1

## 2016-10-13 MED ORDER — IOPAMIDOL (ISOVUE-370) INJECTION 76%
INTRAVENOUS | Status: AC
  Administered 2016-10-13: 50 mL
  Filled 2016-10-13: qty 50

## 2016-10-13 NOTE — Progress Notes (Signed)
10/13/2016      Vestibular Assessment - 10/13/16 1407      Vestibular Assessment   General Observation Eye alingment normal, wears bifocals has had vision checked in the last year, reports h/o vertigo episodes with spinning this is different, she feels like people are " far away" and are not, like she is floating along, off balance. No tinnitus, fullness, no recent URI, no recent antibiotic use.       Symptom Behavior   Type of Dizziness Imbalance   Frequency of Dizziness when up and walking   Duration of Dizziness near constant when up   Aggravating Factors Activity in general   Relieving Factors Not applicable     Occulomotor Exam   Occulomotor Alignment Normal   Spontaneous Absent   Gaze-induced Absent   Smooth Pursuits Intact   Saccades Intact     Vestibulo-Occular Reflex   VOR 1 Head Only (x 1 viewing) normal as best can be assessed due to stiff/poor neck ROM and pt reports of neck "crick" or pain in right posterior paraspinals.    Comment HIT difficult due to pt guarding as best I can tell it is negative.      Auditory   Comments equal grossly     Positional Testing   Dix-Hallpike Dix-Hallpike Right;Dix-Hallpike Left   Horizontal Canal Testing Horizontal Canal Right;Horizontal Canal Left     Dix-Hallpike Right   Dix-Hallpike Right Duration 0   Dix-Hallpike Right Symptoms No nystagmus     Dix-Hallpike Left   Dix-Hallpike Left Duration 0   Dix-Hallpike Left Symptoms No nystagmus     Horizontal Canal Right   Horizontal Canal Right Duration 0   Horizontal Canal Right Symptoms Normal     Horizontal Canal Left   Horizontal Canal Left Duration 0   Horizontal Canal Left Symptoms Normal     10/13/2016 Pt with no signs of vestibular dysfunction.  Heat applied to sore neck.  Remains appropriate for HHPT.  PT will continue to follow acutely to mobilize.    Bridget Ball, PT, DPT [phone]  Charges: 11:15-11:38/2 TA

## 2016-10-13 NOTE — Care Management Obs Status (Signed)
[place] NOTIFICATION   Patient Details  Name: Bridget Ball MRN: [id number] Date of Birth: 03-Dec-1928   Medicare Observation Status Notification Given:  Yes    Pollie Friar, RN 10/13/2016, 4:35 PM

## 2016-10-13 NOTE — Evaluation (Signed)
Occupational Therapy Evaluation Patient Details Name: Bridget Ball MRN: [id number] DOB: Mar 02, 1928 Today's Date: 10/13/2016    History of Present Illness 83 y.o.femalepresenting with 2 days of intermittant vertigo and confusion with trouble ambulating. PHM including medical history significant of right popliteal aneurysm status post repair history of DVT on Xarelto, lymphocytic colitis followed by GI history of subarachnoid bleed resulting in seizures, COPD, dementia, and hydronephrosis with nephrostomy stent. CT showing no acute intracranial findings.    Clinical Impression   PTA, pt was living with her daughter and was performing her ADLs. Pt currently performing ADLs and functional mobility at supervision-Min Guard level. Pt presenting with minimal weakness and decreased coordination of RUE (dominant) compared to LUE. Pt would benefit from further acute OT to facilitate safe dc. Recommend dc home once medically stable per physician with 24 hour assistance/supervision and safety.     Follow Up Recommendations  No OT follow up;Supervision/Assistance - 24 hour    Equipment Recommendations  None recommended by OT    Recommendations for Other Services PT consult     Precautions / Restrictions        Mobility Bed Mobility Overal bed mobility: Needs Assistance Bed Mobility: Supine to Sit     Supine to sit: Min guard;HOB elevated     General bed mobility comments: Min Guard for safety.   Transfers Overall transfer level: Needs assistance Equipment used: Rolling walker (2 wheeled) Transfers: Sit to/from Stand Sit to Stand: Min guard         General transfer comment: [employer] for safety. Pt standing without RW and needing Min A to maintain balance. With RW, pt standing with Min Guard for safety and demonstrated increased balance with UE support    Balance Overall balance assessment: Needs assistance Sitting-balance support: No upper extremity supported;Feet  supported Sitting balance-Leahy Scale: Good     Standing balance support: Bilateral upper extremity supported;During functional activity Standing balance-Leahy Scale: Fair Standing balance comment: Able to maintain standing at sink without UE support. Benefited from UE support for initial sit<>Stand                           ADL either performed or assessed with clinical judgement   ADL Overall ADL's : Needs assistance/impaired Eating/Feeding: Set up;Sitting   Grooming: Oral care;Brushing hair;Supervision/safety;Min guard;Standing Grooming Details (indicate cue type and reason): Performed grooming with Min Guard A for safety. Upper Body Bathing: Set up;Supervision/ safety;Sitting   Lower Body Bathing: Min guard;Sit to/from stand   Upper Body Dressing : Set up;Supervision/safety;Sitting   Lower Body Dressing: Min guard;Sit to/from stand Lower Body Dressing Details (indicate cue type and reason): Donned socks at EOB without difficulty Toilet Transfer: Min guard;RW;Ambulation (Simulated to recliner)           Functional mobility during ADLs: Min guard;Rolling walker General ADL Comments: Pt performing ADLs and functional mobility at [place] guard-supervision level for safety. Demosntrating near baseline funciton. Noted slight weakness and decreased coordination in R (donimant) hand during testing; did not impact funcitonal performance during ADLs.      Vision         Perception     Praxis      Pertinent Vitals/Pain Pain Assessment: Faces Faces Pain Scale: No hurt Pain Intervention(s): Monitored during session     Hand Dominance Right   Extremity/Trunk Assessment Upper Extremity Assessment Upper Extremity Assessment: RUE deficits/detail RUE Deficits / Details: Slight weakness of RUE in grasp, biceps, and triceps.  R hand dominant.  Pt performing grooming tasks without difficulty. Decreased coordination with finger opposition and nose-to-finger test.  RUE  Coordination: decreased fine motor   Lower Extremity Assessment Lower Extremity Assessment: Defer to PT evaluation   Cervical / Trunk Assessment Cervical / Trunk Assessment: Kyphotic   Communication Communication Communication: No difficulties   Cognition Arousal/Alertness: Awake/alert Behavior During Therapy: WFL for tasks assessed/performed Overall Cognitive Status: History of cognitive impairments - at baseline                                 General Comments: Baseline dementia. Pt requires Min VCs to recall which tasks she was asked to perform. Some jumping from topic to topic during conversation but overall able to maintain good conversation   General Comments       Exercises     Shoulder Instructions      Home Living Family/patient expects to be discharged to:: Private residence Living Arrangements: Children (Daughter) Available Help at Discharge: Family;Available 24 hours/day Type of Home: House Home Access: Level entry     Home Layout: One level     Bathroom Shower/Tub: Teacher, early years/pre: Standard     Home Equipment: Mining engineer - 2 wheels;Shower seat          Prior Functioning/Environment Level of Independence: Independent with assistive device(s)        Comments: pt performing ADLs and daughter does IADLs.        OT Problem List: Decreased strength;Decreased activity tolerance;Impaired balance (sitting and/or standing);Decreased knowledge of use of DME or AE;Decreased knowledge of precautions      OT Treatment/Interventions: Self-care/ADL training;Therapeutic exercise;Energy conservation;DME and/or AE instruction;Therapeutic activities;Patient/family education    OT Goals(Current goals can be found in the care plan section) Acute Rehab OT Goals Patient Stated Goal: Go home OT Goal Formulation: With patient Time For Goal Achievement: 10/27/16 Potential to Achieve Goals: Good ADL Goals Pt Will Perform  Grooming: with modified independence;standing Pt Will Perform Upper Body Dressing: with modified independence;standing Pt Will Perform Lower Body Dressing: with modified independence;sit to/from stand Pt Will Transfer to Toilet: with modified independence;ambulating;regular height toilet Pt Will Perform Toileting - Clothing Manipulation and hygiene: with modified independence;sit to/from stand Pt Will Perform Tub/Shower Transfer: Tub transfer;ambulating;shower seat;rolling walker;with supervision;with set-up  OT Frequency: Min 2X/week   Barriers to D/C:            Co-evaluation              AM-PAC PT "6 Clicks" Daily Activity     Outcome Measure Help from another person eating meals?: None Help from another person taking care of personal grooming?: A Little Help from another person toileting, which includes using toliet, bedpan, or urinal?: A Little Help from another person bathing (including washing, rinsing, drying)?: A Little Help from another person to put on and taking off regular upper body clothing?: None Help from another person to put on and taking off regular lower body clothing?: A Little 6 Click Score: 20   End of Session Equipment Utilized During Treatment: Gait belt;Rolling walker Nurse Communication: Mobility status  Activity Tolerance: Patient tolerated treatment well Patient left: in chair;with call bell/phone within reach;with chair alarm set  OT Visit Diagnosis: Unsteadiness on feet (R26.81);Other abnormalities of gait and mobility (R26.89);Muscle weakness (generalized) (M62.81)                Time: 2952-8413 OT  Time Calculation (min): 36 min Charges:  OT General Charges $OT Visit: 1 Visit OT Evaluation $OT Eval Moderate Complexity: 1 Mod OT Treatments $Self Care/Home Management : 8-22 mins G-Codes: OT G-codes **NOT FOR INPATIENT CLASS** Functional Assessment Tool Used: Clinical judgement Functional Limitation: Self care Self Care Current Status  (R7116): At least 1 percent but less than 20 percent impaired, limited or restricted Self Care Goal Status (F7903): 0 percent impaired, limited or restricted   [place], OTR/L Acute Rehab Pager: [phone] Office: [place] 10/13/2016, 9:02 AM

## 2016-10-13 NOTE — Progress Notes (Signed)
Pt unavailable for EEG; will perform EEG tomorrow as schedule permits.

## 2016-10-13 NOTE — Progress Notes (Signed)
  Echocardiogram 2D Echocardiogram has been performed.  Bridget Ball 10/13/2016, 3:56 PM

## 2016-10-13 NOTE — Progress Notes (Addendum)
PROGRESS NOTE    Bridget Ball  HYW:[id number] DOB: [birth year] DOA: 10/12/2016 PCP: Celene Squibb, MD   Chief Complaint  Patient presents with  . Dizziness    Brief Narrative:  Admitted for TIA vs CVA, pending workup. Neurology consulted and appreciated. Also had some urinary retention, however, foley removed and urinating. Assessment & Plan   Vertigo, Gait Unsteadiness, rule out TIA vs CVA -CT head shows stable age-related cerebral atrophy, ventriculomegaly, periventricular white matter disease. No acute intracranial findings or mass lesions -Pending MRI brain, CTA head and neck, echocardiogram -Neurology consulted and appreciated -LDL 59, hemoglobin A1c 5.5 -Continue aspirin, statin -OT evaluate patient, no further needs -PT recommended home health -Vestibular PT: Patient with no signs of vestibular dysfunction recommended home health PT -Patient is follow-up with Hoyle Sauer and G NA on 10/20/2016  Possible encephalopathy -with history of underlying dementia -may be complicated by acute urinary retention -appears to be improving  Seizure disorder/History of subarachnoid hemorrhage -EEG ordered and pending -Continue Keppra   COPD -Appears to be stable -Continue Spiriva, Dulera (substituted for Symbicort), albuterol every 6 hours as needed  Dementia -Stable, continue Namenda  GERD -Continue PPI  Left hydronephrosis/ Acute urinary retention  -patient follows with Dr. Diona Fanti -UA showed small leukocytes, no bacteria, 0-5 WBCs. Urine culture pending -Foley catheter was inserted -Vesicare was held -Have placed on Flomax -Removed foley catheter, voiding trial -Renal ultrasound showed atrophic left kidney, otherwise negative exam  History of DVT/Right popliteal artery aneursym  -Was on Xarelto of RLE DVT due to aneurysm  -repeat LE Doppler in April 2018 was negative for DVT, therefore Xarelto was discontinued -Status post repair with Dr. Arita Miss in January  2018 -Patient has follow-up appointment with Dr. Trula Slade later this month  DVT Prophylaxis  SCDs  Code Status: Full  Family Communication: None at bedside  Disposition Plan: Observation. Home when workup is complete, likely within the next 24 hours  Consultants Neurology  Procedures  None  Antibiotics   Anti-infectives    None      Subjective:   Bridget Ball seen and examined today.  Patient states she still feels funny. Denies any current chest pain, shortness breath, abdominal pain, nausea vomiting, diarrhea or constipation, headache. Objective:   Vitals:   10/13/16 0856 10/13/16 0901 10/13/16 1059 10/13/16 1318  BP: 104/65   119/65  Pulse: 98   (!) 103  Resp: 16   20  Temp: 98.4 F (36.9 C)   97.9 F (36.6 C)  TempSrc: Oral   Oral  SpO2: 95% 96% 96% 95%  Weight:      Height:        Intake/Output Summary (Last 24 hours) at 10/13/16 1401 Last data filed at 10/13/16 1309  Gross per 24 hour  Intake              680 ml  Output              989 ml  Net             -309 ml   Filed Weights   10/12/16 2241  Weight: 39 kg (85 lb 14.4 oz)    Exam  General: Well developed, Elderly, thin, no apparent distress  HEENT: NCAT, mucous membranes moist.   Cardiovascular: S1 S2 auscultated, RRR  Respiratory: Clear to auscultation bilaterally with equal chest rise  Abdomen: Soft, nontender, nondistended, + bowel sounds  Extremities: warm dry without cyanosis clubbing or edema  Neuro: AAOx3, nonfocal  Psych: appropriate  Data Reviewed: I have personally reviewed following labs and imaging studies  CBC:  Recent Labs Lab 10/12/16 1637 10/13/16 0322  WBC 7.3 5.9  NEUTROABS 5.5  --   HGB 13.3 12.1  HCT 41.3 37.8  MCV 101.0* 100.0  PLT 196 294   Basic Metabolic Panel:  Recent Labs Lab 10/12/16 1637 10/13/16 0322  NA 138 138  K 4.0 3.9  CL 101 102  CO2 29 30  GLUCOSE 78 127*  BUN 19 19  CREATININE 0.95 0.89  CALCIUM 9.4 9.1  MG  --  2.0   PHOS  --  3.2   GFR: Estimated Creatinine Clearance: 27.9 mL/min (by C-G formula based on SCr of 0.89 mg/dL). Liver Function Tests:  Recent Labs Lab 10/13/16 0322  AST 31  ALT 27  ALKPHOS 44  BILITOT 0.7  PROT 5.6*  ALBUMIN 3.4*   No results for input(s): LIPASE, AMYLASE in the last 168 hours. No results for input(s): AMMONIA in the last 168 hours. Coagulation Profile: No results for input(s): INR, PROTIME in the last 168 hours. Cardiac Enzymes:  Recent Labs Lab 10/12/16 2322  TROPONINI <0.03   BNP (last 3 results) No results for input(s): PROBNP in the last 8760 hours. HbA1C:  Recent Labs  10/13/16 0322  HGBA1C 5.5   CBG: No results for input(s): GLUCAP in the last 168 hours. Lipid Profile:  Recent Labs  10/13/16 0322  CHOL 147  HDL 75  LDLCALC 59  TRIG 66  CHOLHDL 2.0   Thyroid Function Tests:  Recent Labs  10/13/16 0322  TSH 0.984   Anemia Panel: No results for input(s): VITAMINB12, FOLATE, FERRITIN, TIBC, IRON, RETICCTPCT in the last 72 hours. Urine analysis:    Component Value Date/Time   COLORURINE YELLOW 10/13/2016 0309   APPEARANCEUR HAZY (A) 10/13/2016 0309   LABSPEC 1.019 10/13/2016 0309   PHURINE 6.0 10/13/2016 0309   GLUCOSEU NEGATIVE 10/13/2016 0309   HGBUR SMALL (A) 10/13/2016 0309   BILIRUBINUR NEGATIVE 10/13/2016 0309   KETONESUR 5 (A) 10/13/2016 0309   PROTEINUR 30 (A) 10/13/2016 0309   UROBILINOGEN 0.2 07/26/2013 2234   NITRITE NEGATIVE 10/13/2016 0309   LEUKOCYTESUR LARGE (A) 10/13/2016 0309   Sepsis Labs: @LABRCNTIP (procalcitonin:4,lacticidven:4)  )No results found for this or any previous visit (from the past 240 hour(s)).    Radiology Studies: Dg Abd 1 View  Result Date: 10/12/2016 CLINICAL DATA:  Renal stent, vertigo, dizziness, obstipation, COPD, prior MI EXAM: ABDOMEN - 1 VIEW COMPARISON:  06/11/2015 FINDINGS: LEFT ureteral stent. Osseous demineralization. Atherosclerotic calcifications aorta and iliac  arteries. Scattered stool and gas in colon. Nonobstructive bowel gas pattern without bowel dilatation or wall thickening. No definite urinary tract calcification. IMPRESSION: No acute abnormalities. Electronically Signed   By: Lavonia Dana M.D.   On: 10/12/2016 22:14   Ct Head Wo Contrast  Result Date: 10/12/2016 CLINICAL DATA:  Vertigo and dizziness for 2 days. EXAM: CT HEAD WITHOUT CONTRAST TECHNIQUE: Contiguous axial images were obtained from the base of the skull through the vertex without intravenous contrast. COMPARISON:  11/19/2012 FINDINGS: Brain: Stable age related cerebral atrophy, ventriculomegaly and periventricular white matter disease. No extra-axial fluid collections are identified. No CT findings for acute hemispheric infarction or intracranial hemorrhage. No mass lesions. Stable curvilinear cortical calcifications at the right vertex. The brainstem and cerebellum are normal. Vascular: Stable vascular calcifications. No hyperdense vessels or aneurysm. Skull: No skull fracture or bone lesion. Sinuses/Orbits: The paranasal sinuses and mastoid air cells are clear. The globes are  intact. Other: No scalp lesion or hematoma. IMPRESSION: 1. Stable age related cerebral atrophy, ventriculomegaly and periventricular white matter disease. 2. No acute intracranial findings or mass lesions. Electronically Signed   By: Marijo Sanes M.D.   On: 10/12/2016 16:13   US Renal  Result Date: 10/12/2016 CLINICAL DATA:  Urinary retention for 3 days EXAM: RENAL / URINARY TRACT ULTRASOUND COMPLETE COMPARISON:  06/02/2016 FINDINGS: Right Kidney: Length: 9.9 cm. Normal cortical thickness and echogenicity. No mass, hydronephrosis or shadowing calcification. Left Kidney: Length: 7.6 cm. Cortical atrophy. No gross mass or hydronephrosis. No shadowing calcification. Bladder: Decompressed by Foley catheter. Ureteral stent seen on the previous exam is not identified on the current study. IMPRESSION: Atrophic LEFT kidney.  Otherwise negative exam. Electronically Signed   By: Lavonia Dana M.D.   On: 10/12/2016 20:16   Dg Knee Complete 4 Views Right  Result Date: 10/12/2016 CLINICAL DATA:  Fall today with generalized right knee pain. Insert image EXAM: RIGHT KNEE - COMPLETE 4+ VIEW COMPARISON:  None. FINDINGS: No evidence of fracture, dislocation, or joint effusion. Osteopenia and mild degenerative spurring. Atherosclerotic calcification. IMPRESSION: No acute finding. Electronically Signed   By: Monte Fantasia M.D.   On: 10/12/2016 16:20     Scheduled Meds: .  stroke: mapping our early stages of recovery book   Does not apply Once  . aspirin  300 mg Rectal Daily   Or  . aspirin  325 mg Oral Daily  . atorvastatin  40 mg Oral Daily  . citalopram  10 mg Oral Daily  . levETIRAcetam  250 mg Oral BID  . memantine  28 mg Oral Daily  . mometasone-formoterol  2 puff Inhalation BID  . pantoprazole  40 mg Oral QODAY   Continuous Infusions: . sodium chloride 75 mL/hr at 10/12/16 2300     LOS: 0 days   Time Spent in minutes   30 minutes  [place] Samford D.O. on 10/13/2016 at 2:01 PM  Between 7am to 7pm - Pager - [phone]  After 7pm go to [email or web address] - password [email or web address]  And look for the night coverage person covering for me after hours  [facility] Office  [phone]

## 2016-10-13 NOTE — Progress Notes (Signed)
Foley cath removed without difficulty, 350cc collected, call bell in place.

## 2016-10-13 NOTE — Progress Notes (Signed)
Physical Therapy Evaluation Patient Details Name: Bridget Ball MRN: [id number] DOB: 11-Dec-1928 Today's Date: 10/13/2016   History of Present Illness  83 y.o.femalepresenting with 2 days of intermittant vertigo and confusion with trouble ambulating. PMH including right popliteal aneurysm s/p repair history of DVT on Xarelto, lymphocytic colitis followed by GI history of subarachnoid bleed resulting in seizures, COPD, dementia, and hydronephrosis with nephrostomy stent. CT showing no acute intracranial findings.   Clinical Impression  Patient presents with the above. Evaluated patient and noted findings below. Prior to admission patient was modified independent with mobility and ADLs. Patient reports feeling "spacey" during ambulation and driving being unable to break focus from a specific target. Otherwise no significant deficits or LOB noted throughout session. Recommending HHPT with a vestibular specialist to address deficits. PT will continue to follow acutely and progress as tolerated.    Follow Up Recommendations Home health PT    Equipment Recommendations  None recommended by PT    Recommendations for Other Services       Precautions / Restrictions Precautions Precautions: Fall Restrictions Weight Bearing Restrictions: No      Mobility  Bed Mobility Overal bed mobility: Needs Assistance Bed Mobility: Supine to Sit     Supine to sit: Min guard;HOB elevated     General bed mobility comments: Pt OOB in recliner upon arrival  Transfers Overall transfer level: Needs assistance Equipment used: Rolling walker (2 wheeled) Transfers: Sit to/from Stand Sit to Stand: Min guard         General transfer comment: min guard for safety; VCs for proper hand placement to RW.  Ambulation/Gait Ambulation/Gait assistance: Min guard Ambulation Distance (Feet): 150 Feet Assistive device: None Gait Pattern/deviations: Step-through pattern;Decreased stride length;Trunk  flexed;Narrow base of support;Shuffle Gait velocity: decreased   General Gait Details: brief trial with walker but able to ambulate without it. Needing min guard for safety unable to challenge gait significantly secondary to patient reports of feeling "spacey" when she is not focused on line in floor. Slow and steady gait with no significant LOB noted. Shuffling is evident intermittently when starting and toward the end of session.  Stairs            Wheelchair Mobility    Modified Rankin (Stroke Patients Only)       Balance Overall balance assessment: Needs assistance Sitting-balance support: No upper extremity supported;Feet supported Sitting balance-Leahy Scale: Good     Standing balance support: During functional activity;No upper extremity supported Standing balance-Leahy Scale: Fair Standing balance comment: Able to maintain standing at sink without UE support. Benefited from UE support for initial sit<>Stand             High level balance activites: Turns;Direction changes High Level Balance Comments: requires several small steps and increased time to turn and change directions; no LOB noted             Pertinent Vitals/Pain Pain Assessment: No/denies pain Faces Pain Scale: No hurt Pain Intervention(s): Monitored during session    Home Living Family/patient expects to be discharged to:: Private residence Living Arrangements:  (Daughter) Available Help at Discharge: Family;Available 24 hours/day Type of Home: House Home Access: Level entry     Home Layout: One level Home Equipment: Kasandra Knudsen - quad;Walker - 2 wheels;Shower seat      Prior Function Level of Independence: Independent with assistive device(s)         Comments: pt performing ADLs and daughter does IADLs. Uses AD PRN     Hand Dominance  Dominant Hand: Right    Extremity/Trunk Assessment   Upper Extremity Assessment Upper Extremity Assessment: Defer to OT evaluation RUE Deficits  / Details: Slight weakness of RUE in grasp, biceps, and triceps. R hand dominant.  Pt performing grooming tasks without difficulty. Decreased coordination with finger opposition and nose-to-finger test.  RUE Coordination: decreased fine motor    Lower Extremity Assessment Lower Extremity Assessment: RLE deficits/detail;LLE deficits/detail RLE Sensation: decreased light touch (over dorsum of foot) LLE Sensation: decreased light touch (over dorsum of foot)    Cervical / Trunk Assessment Cervical / Trunk Assessment: Kyphotic  Communication   Communication: No difficulties  Cognition Arousal/Alertness: Awake/alert Behavior During Therapy: WFL for tasks assessed/performed Overall Cognitive Status: History of cognitive impairments - at baseline                                 General Comments: Baseline h/o dementia. Mild tangential thoughts throughout session but able to maintain good conversation. Min VCs to orient to time.       General Comments      Exercises     Assessment/Plan    PT Assessment Patient needs continued PT services  PT Problem List Decreased activity tolerance;Decreased balance;Decreased mobility;Decreased cognition;Decreased knowledge of use of DME       PT Treatment Interventions DME instruction;Gait training;Stair training;Functional mobility training;Therapeutic activities;Therapeutic exercise;Balance training;Neuromuscular re-education;Patient/family education    PT Goals (Current goals can be found in the Care Plan section)  Acute Rehab PT Goals Patient Stated Goal: Go home PT Goal Formulation: With patient Time For Goal Achievement: 10/27/16 Potential to Achieve Goals: Good    Frequency Min 3X/week   Barriers to discharge        Co-evaluation               AM-PAC PT "6 Clicks" Daily Activity  Outcome Measure Difficulty turning over in bed (including adjusting bedclothes, sheets and blankets)?: A Lot Difficulty moving from  lying on back to sitting on the side of the bed? : A Lot Difficulty sitting down on and standing up from a chair with arms (e.g., wheelchair, bedside commode, etc,.)?: A Little Help needed moving to and from a bed to chair (including a wheelchair)?: A Little Help needed walking in hospital room?: A Little Help needed climbing 3-5 steps with a railing? : A Lot 6 Click Score: 15    End of Session Equipment Utilized During Treatment: Gait belt Activity Tolerance: Patient tolerated treatment well Patient left: in chair;with chair alarm set;with call bell/phone within reach;with nursing/sitter in room Nurse Communication: Mobility status PT Visit Diagnosis: Unsteadiness on feet (R26.81);Other abnormalities of gait and mobility (R26.89);Other symptoms and signs involving the nervous system (R29.898)    Time: 8676-1950 PT Time Calculation (min) (ACUTE ONLY): 21 min   Charges:   PT Evaluation $PT Eval Low Complexity: 1 Low     PT G Codes:   PT G-Codes **NOT FOR INPATIENT CLASS** Functional Assessment Tool Used: Clinical judgement Functional Limitation: Mobility: Walking and moving around Mobility: Walking and Moving Around Current Status (D3267): At least 1 percent but less than 20 percent impaired, limited or restricted Mobility: Walking and Moving Around Goal Status [phone]): 0 percent impaired, limited or restricted    A. Quintella Reichert, SPT [phone] office   Margarita Grizzle 10/13/2016, 10:16 AM

## 2016-10-13 NOTE — Progress Notes (Signed)
STROKE TEAM PROGRESS NOTE   SUBJECTIVE (INTERVAL HISTORY) Her therapist is at the bedside.  Overall she feels her condition is completely resolved. She stated that she had difficulty with urination yesterday and she also had strange feelings at that time, felt like people and space were going away from her but actually people were coming over to her. She denies room spinning, denies dizziness. She did felt her gait was wobbly yesterday. Currently her symptoms resolved.    OBJECTIVE Temp:  [97.7 F (36.5 C)-98.4 F (36.9 C)] 98.4 F (36.9 C) (10/09 0856) Pulse Rate:  [77-98] 98 (10/09 0856) Cardiac Rhythm: Normal sinus rhythm (10/09 0835) Resp:  [14-20] 16 (10/09 0856) BP: (104-144)/(63-90) 104/65 (10/09 0856) SpO2:  [92 %-99 %] 96 % (10/09 1059) Weight:  [85 lb 14.4 oz (39 kg)] 85 lb 14.4 oz (39 kg) (10/08 2241)  No results for input(s): GLUCAP in the last 168 hours.  Recent Labs Lab 10/12/16 1637 10/13/16 0322  NA 138 138  K 4.0 3.9  CL 101 102  CO2 29 30  GLUCOSE 78 127*  BUN 19 19  CREATININE 0.95 0.89  CALCIUM 9.4 9.1  MG  --  2.0  PHOS  --  3.2    Recent Labs Lab 10/13/16 0322  AST 31  ALT 27  ALKPHOS 44  BILITOT 0.7  PROT 5.6*  ALBUMIN 3.4*    Recent Labs Lab 10/12/16 1637 10/13/16 0322  WBC 7.3 5.9  NEUTROABS 5.5  --   HGB 13.3 12.1  HCT 41.3 37.8  MCV 101.0* 100.0  PLT 196 179    Recent Labs Lab 10/12/16 2322  TROPONINI <0.03   No results for input(s): LABPROT, INR in the last 72 hours.  Recent Labs  10/12/16 1524 10/13/16 0309  COLORURINE YELLOW YELLOW  LABSPEC 1.010 1.019  PHURINE 7.0 6.0  GLUCOSEU NEGATIVE NEGATIVE  HGBUR NEGATIVE SMALL*  BILIRUBINUR NEGATIVE NEGATIVE  KETONESUR NEGATIVE 5*  PROTEINUR NEGATIVE 30*  NITRITE NEGATIVE NEGATIVE  LEUKOCYTESUR SMALL* LARGE*       Component Value Date/Time   CHOL 147 10/13/2016 0322   TRIG 66 10/13/2016 0322   HDL 75 10/13/2016 0322   CHOLHDL 2.0 10/13/2016 0322   VLDL 13  10/13/2016 0322   LDLCALC 59 10/13/2016 0322   Lab Results  Component Value Date   HGBA1C 5.5 10/13/2016      Component Value Date/Time   LABOPIA NONE DETECTED 11/19/2012 1653   COCAINSCRNUR NONE DETECTED 11/19/2012 1653   LABBENZ NONE DETECTED 11/19/2012 1653   AMPHETMU NONE DETECTED 11/19/2012 1653   THCU NONE DETECTED 11/19/2012 1653   LABBARB NONE DETECTED 11/19/2012 1653    No results for input(s): ETH in the last 168 hours.  I have personally reviewed the radiological images below and agree with the radiology interpretations.  Ct Head Wo Contrast 10/12/2016 IMPRESSION: 1. Stable age related cerebral atrophy, ventriculomegaly and periventricular white matter disease. 2. No acute intracranial findings or mass lesions.   US Renal 10/12/2016 IMPRESSION: Atrophic LEFT kidney. Otherwise negative exam. Electronically Signed   By: Lavonia Dana M.D.   On: 10/12/2016 20:16   CTA head and neck pending  MRI brain pending  2D Echocardiogram  Pending  EEG pending   PHYSICAL EXAM  Temp:  [97.7 F (36.5 C)-98.4 F (36.9 C)] 98.4 F (36.9 C) (10/09 0856) Pulse Rate:  [77-98] 98 (10/09 0856) Resp:  [14-20] 16 (10/09 0856) BP: (104-144)/(63-90) 104/65 (10/09 0856) SpO2:  [92 %-99 %] 96 % (10/09 1059)  Weight:  [85 lb 14.4 oz (39 kg)] 85 lb 14.4 oz (39 kg) (10/08 2241)  General - Well nourished, well developed, in no apparent distress.  Ophthalmologic - Sharp disc margins OU.  Cardiovascular - Regular rate and rhythm with no murmur.  Mental Status -  Level of arousal and orientation to time, place, and person were intact. Language including expression, naming, repetition, comprehension was assessed and found intact. Fund of Knowledge was assessed and was intact.  Cranial Nerves II - XII - II - Visual field intact OU. III, IV, VI - Extraocular movements intact. V - Facial sensation intact bilaterally. VII - Facial movement intact bilaterally. VIII - Hearing & vestibular  intact bilaterally. X - Palate elevates symmetrically. XI - Chin turning & shoulder shrug intact bilaterally XII - Tongue protrusion intact.  Motor Strength - The patient's strength was normal in all extremities and pronator drift was absent.  Bulk was normal and fasciculations were absent.   Motor Tone - Muscle tone was assessed at the neck and appendages and was normal.  Reflexes - The patient's reflexes were symmetrical in all extremities and she had no pathological reflexes.  Sensory - Light touch, temperature/pinprick were assessed and were symmetrical.    Coordination - The patient had normal movements in the hands and feet with no ataxia or dysmetria.  Tremor was absent.  Gait and Station - deferred   ASSESSMENT/PLAN [name] is a 83 y.o. female with history of HLD, CAD/MI, DVT, s/p right popliteal aneurysm repair, s/p nephrectomy tube placement, dementia on namenda, SAH with seizure in 2014 following with Dr. Leonie Man admitted for difficulty with urination. Had symptoms of transient strange feeling and slight wobbly gait.    Possible encephalopathy in the setting of urinary difficulty - less likely seizure  Resultant - strange feeling and slight wobbly gait resolved.  MRI  pending  CTA head and neck - pending  2D Echo  Pending  EEG pending  LDL 59  HgbA1c 5.5  SCDs for VTE prophylaxis  Diet Heart Room service appropriate? Yes; Fluid consistency: Thin   No antithrombotic prior to admission, now on aspirin 325 mg daily. Continue ASA on discharge.  Patient counseled to be compliant with her antithrombotic medications  Ongoing aggressive stroke risk factor management  Therapy recommendations:  pending  Disposition:  pending  Acute urinary retention  Hx of hydronephrosis   S/p nephrostomy tube  Management as per primary team  Hyperlipidemia  Home meds:  lipitor 80   Currently on lipitor 40  LDL 59, goal < 70  Continue statin at  discharge  Hx of right popliteal artery aneurysm  S/p repair with Dr. Trula Slade in 01/2016  Had RLE DVT due to the aneurysm - was on Xarelto  Repeat LE venous doppler 04/2016 - negative DVT - Xarelto stopped  Follow up with Dr. Trula Slade later this month to repeat RLE testing  Hx of SAH with seizure in 11/2012  EEG negative at that time  Was on keppra and depakote  depakote discontinued  Still on keppra 250mg  bid  Continue keppra   Other Stroke Risk Factors  Advanced age  quit smoking 18 years ago  ETOH use - recommend < 1 drink per day  Other Active Problems  Malnutrition - Body mass index is 16.23 kg/m.   Dementia - on namenda   Follows with Dr. Leonie Man and Hoyle Sauer at [facility] 10/20/16  Hospital day # 0   Rosalin Hawking, MD PhD Stroke Neurology 10/13/2016 12:44  PM    To contact Stroke Continuity provider, please refer to [email or web address] After hours, contact General Neurology

## 2016-10-13 NOTE — Consult Note (Signed)
NEURO HOSPITALIST CONSULT NOTE   Requestig physician: Dr. Roel Cluck  Reason for Consult: Vertigo and gait unsteadiness  History obtained from:  Patient and Chart    HPI:                                                                                                                                          ICYSS SKOG is an 83 y.o. female who presented to the [facility] ED with stated inability to void. Subsequently, daughter stated that the patient had been experiencing dizziness for several days, which had responded to meclizine. She was "spacey" per daughter today. The patient states that when she woke up on Monday morning, she was normal, but then developed vertigo and gait unsteadiness. She then went back to sleep. Later she was taken to the ED for evaluation of the above symptoms. She denies having had problems with speech, vision, limb strength or coordination. She endorses having had sensory numbness earlier on Monday.  She has a history of mild dementia; she has been more confused lately. 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 Had been taking Xarelto up until a few months ago for RLE DVT. She has a prior history of stroke in 2014.   Past Medical History:  Diagnosis Date  . Anxiety   . Arthritis   . Attention to nephrostomy ([place])    PT HAS NEPHROSTOMY TUBE IN PLACE  . Bruises easily   . Cancer [facility])    Skin cancer  . Chronic diarrhea   . Colitis   . COPD (chronic obstructive pulmonary disease) ([place])   . Dementia   . Depression   . GERD (gastroesophageal reflux disease)   . Hiatal hernia   . Hx of bronchitis   . Hx of pulmonary edema JULY 2015  . Hx of pulmonary edema JULY 2015  . Hx of septic shock JULY 2015  . Hydronephrosis, left   . Hyperlipidemia   . Memory difficulties   . Microscopic colitis   . Myocardial infarction [facility])    unknown time   . Renal insufficiency   .  Seizures ([place])    last seizure was 4-5 years ago with "brain bleed"  . Shortness of breath    with exertion  . Stroke [facility])    TIA four years ago / stroke NOV 2014    Past Surgical History:  Procedure Laterality Date  . APPENDECTOMY    . BREAST BIOPSY Right 02/02/2014   Procedure: RIGHT BREAST BIOPSY;  Surgeon: Jamesetta So, MD;  Location: [facility] ORS;  Service: General;  Laterality: Right;  . COLONOSCOPY N/A 05/24/2013   Procedure: COLONOSCOPY;  Surgeon: Rogene Houston, MD;  Location: [facility] ENDO SUITE;  Service: Endoscopy;  Laterality: N/A;  100  . CYSTOSCOPY W/ URETERAL STENT PLACEMENT Left 08/06/2015   Procedure: CYSTOSCOPY WITH LEFT URETERAL STENT EXCHANGE - Sammie Bench;  Surgeon: Franchot Gallo, MD;  Location: [facility] ORS;  Service: Urology;  Laterality: Left;  . CYSTOSCOPY W/ URETERAL STENT PLACEMENT Left 07/07/2016   Procedure: CYSTOSCOPY, LEFT RETROGRADE PYELOGRAM WITH LEFT URETERAL STENT REPLACEMENT;  Surgeon: Franchot Gallo, MD;  Location: [facility] ORS;  Service: Urology;  Laterality: Left;  . CYSTOSCOPY WITH RETROGRADE PYELOGRAM, URETEROSCOPY AND STENT PLACEMENT Left 09/18/2013   Procedure: CYSTOSCOPY WITH RETROGRADE PYELOGRAM,  URETEROSCOPY AND  STENT PLACEMENT, removal of nephrostomy tube;  Surgeon: Jorja Loa, MD;  Location: [facility] ORS;  Service: Urology;  Laterality: Left;  . CYSTOSCOPY WITH STENT PLACEMENT Left 09/04/2014   Procedure: CYSTOSCOPY WITH LEFT J2 STENT EXCHANGE;  Surgeon: Franchot Gallo, MD;  Location: [facility] OR[facility];  Service: Urology;  Laterality: Left;  . ENDOVASCULAR REPAIR OF POPLITEAL ARTERY ANEURYSM  02/05/2016  . ESOPHAGOGASTRODUODENOSCOPY N/A 05/24/2013   Procedure: ESOPHAGOGASTRODUODENOSCOPY (EGD);  Surgeon: Rogene Houston, MD;  Location[facility];  Service: Endoscopy;  Laterality: N/A;  . FALSE ANEURYSM REPAIR Right 02/05/2016   Procedure: REPAIR RIGHT POLITEAL  ANEURYSM;  Surgeon: Serafina Mitchell, MD;  Location: [place];  Service: Vascular;  Laterality: Right;  .  KNEE ARTHROSCOPY Right   . SKIN CANCER EXCISION    . TONSILLECTOMY    . TUBAL LIGATION      Family History  Problem Relation Age of Onset  . Diabetes Mother   . Stroke Mother   . Heart attack Father    Social History:  reports that she quit smoking about 18 years ago. Her smoking use included Cigarettes. She has a 50.00 pack-year smoking history. She has never used smokeless tobacco. She reports that she drinks about 4.2 oz of alcohol per week . She reports that she does not use drugs.  Allergies  Allergen Reactions  . Penicillins Anaphylaxis    Throat swelling as a child. Tolerated ceftriaxone 07/29/13.     MEDICATIONS:                                                                                                                     Prior to Admission:  Prescriptions Prior to Admission  Medication Sig Dispense Refill Last Dose  . albuterol (PROVENTIL HFA;VENTOLIN HFA) 108 (90 BASE) MCG/ACT inhaler Inhale 2 puffs into the lungs every 6 (six) hours as needed for wheezing or shortness of breath. 1 Inhaler 0 [place]  . atorvastatin (LIPITOR) 80 MG tablet Take 1 tablet (80 mg total) by mouth daily at 6 PM. (Patient taking differently: Take 40 mg by mouth daily. TAKES 0.5 TABLET) 30 tablet 0 10/11/2016 at Unknown time  . budesonide-formoterol (SYMBICORT) 160-4.5 MCG/ACT inhaler Inhale 2 puffs into the lungs 2 (two) times daily. 1 Inhaler 0 10/12/2016 at Unknown time  . citalopram (CELEXA) 10 MG tablet Take 1 tablet (10 mg total) by mouth  daily. 90 tablet 2 10/11/2016 at Unknown time  . docusate sodium (COLACE) 100 MG capsule Take 200 mg by mouth daily.    10/11/2016 at Unknown time  . feeding supplement, ENSURE ENLIVE, (ENSURE ENLIVE) LIQD Take 237 mLs by mouth 2 (two) times daily between meals. 237 mL 12 Taking  . Ferrous Sulfate 27 MG TABS Take 27 mg by mouth daily.   10/11/2016 at Unknown time  . levETIRAcetam (KEPPRA) 250 MG tablet TAKE ONE TABLET TWICE DAILY 60 tablet 5 10/11/2016 at Unknown time   . meloxicam (MOBIC) 7.5 MG tablet Take 7.5 mg by mouth 2 (two) times daily.   10/11/2016 at Unknown time  . memantine (NAMENDA XR) 28 MG CP24 24 hr capsule Take 1 capsule (28 mg total) by mouth daily. 90 capsule 3 10/11/2016 at Unknown time  . Methylfol-Algae-B12-Acetylcyst (CEREFOLIN NAC) 6-90.314-2-600 MG TABS Take 1 tablet by mouth daily. 90 tablet 3 10/11/2016 at Unknown time  . naproxen sodium (ANAPROX) 220 MG tablet Take 440 mg by mouth daily as needed (HA, back pain).    Past Week at Unknown time  . pantoprazole (PROTONIX) 40 MG tablet Take 1 tablet (40 mg total) by mouth every other day. 45 tablet 1 10/11/2016 at Unknown time  . Potassium 99 MG TABS Take 99 mg by mouth daily.   10/11/2016 at Unknown time  . SPIRIVA HANDIHALER 18 MCG inhalation capsule Place 1 capsule into inhaler and inhale daily.  1 10/11/2016 at Unknown time  . VESICARE 10 MG tablet Take 5 mg by mouth daily.    10/11/2016 at Unknown time   Scheduled: .  stroke: mapping our early stages of recovery book   Does not apply Once  . aspirin  300 mg Rectal Daily   Or  . aspirin  325 mg Oral Daily  . atorvastatin  40 mg Oral Daily  . citalopram  10 mg Oral Daily  . levETIRAcetam  250 mg Oral BID  . memantine  28 mg Oral Daily  . mometasone-formoterol  2 puff Inhalation BID  . pantoprazole  40 mg Oral QODAY     ROS:                                                                                                                                       As per HPI.   BP 113/63 (BP Location: Left Arm)   Pulse 88   Temp 97.7 F (36.5 C) (Oral)   Resp 18   Ht 5\' 1"  (1.549 m)   Wt 39 kg (85 lb 14.4 oz)   SpO2 95%   BMI 16.23 kg/m   HEENT: [place]/AT Lungs: Respirations unlabored Ext: No edema  Neurological Examination Mental Status: Alert, fully oriented, thought content appropriate.  Speech fluent with intact comprehension and naming.  Able to follow all commands without difficulty. Cranial Nerves: II: Visual fields intact.  PERRL.  III,IV, VI: EOMI. No  nystagmus. No ptosis.  V,VII: smile symmetric, facial temp sensation normal bilaterally VIII: hearing intact to conversation IX,X: No hypophonia XI: Symmetric XII: midline tongue extension Motor: Right : Upper extremity   5/5    Left:     Upper extremity   5/5  Lower extremity   5/5     Lower extremity   5/5 Sensory: Temp and light touch intact x 4 without extinction Deep Tendon Reflexes: Normoactive x 4 Cerebellar: RUE normal. LUE with subtle dyssinergia and past pointing. Gait: Deferred due to falls risk concerns   Lab Results: Basic Metabolic Panel:  Recent Labs Lab 10/12/16 1637 10/13/16 0322  NA 138 138  K 4.0 3.9  CL 101 102  CO2 29 30  GLUCOSE 78 127*  BUN 19 19  CREATININE 0.95 0.89  CALCIUM 9.4 9.1  MG  --  2.0  PHOS  --  3.2    Liver Function Tests:  Recent Labs Lab 10/13/16 0322  AST 31  ALT 27  ALKPHOS 44  BILITOT 0.7  PROT 5.6*  ALBUMIN 3.4*   No results for input(s): LIPASE, AMYLASE in the last 168 hours. No results for input(s): AMMONIA in the last 168 hours.  CBC:  Recent Labs Lab 10/12/16 1637 10/13/16 0322  WBC 7.3 5.9  NEUTROABS 5.5  --   HGB 13.3 12.1  HCT 41.3 37.8  MCV 101.0* 100.0  PLT 196 179    Cardiac Enzymes:  Recent Labs Lab 10/12/16 2322  TROPONINI <0.03    Lipid Panel:  Recent Labs Lab 10/13/16 0322  CHOL 147  TRIG 66  HDL 75  CHOLHDL 2.0  VLDL 13  LDLCALC 59    CBG: No results for input(s): GLUCAP in the last 168 hours.  Microbiology: Results for orders placed or performed during the hospital encounter of 01/30/16  Surgical pcr screen     Status: None   Collection Time: 01/30/16  3:42 PM  Result Value Ref Range Status   MRSA, PCR NEGATIVE NEGATIVE Final   Staphylococcus aureus NEGATIVE NEGATIVE Final    Comment:        The Xpert SA Assay (FDA approved for NASAL specimens in patients over 25 years of age), is one component of a comprehensive  surveillance program.  Test performance has been validated by [facility] for patients greater than or equal to 33 year old. It is not intended to diagnose infection nor to guide or monitor treatment.     Coagulation Studies: No results for input(s): LABPROT, INR in the last 72 hours.  Imaging: Dg Abd 1 View  Result Date: 10/12/2016 CLINICAL DATA:  Renal stent, vertigo, dizziness, obstipation, COPD, prior MI EXAM: ABDOMEN - 1 VIEW COMPARISON:  06/11/2015 FINDINGS: LEFT ureteral stent. Osseous demineralization. Atherosclerotic calcifications aorta and iliac arteries. Scattered stool and gas in colon. Nonobstructive bowel gas pattern without bowel dilatation or wall thickening. No definite urinary tract calcification. IMPRESSION: No acute abnormalities. Electronically Signed   By: Lavonia Dana M.D.   On: 10/12/2016 22:14   Ct Head Wo Contrast  Result Date: 10/12/2016 CLINICAL DATA:  Vertigo and dizziness for 2 days. EXAM: CT HEAD WITHOUT CONTRAST TECHNIQUE: Contiguous axial images were obtained from the base of the skull through the vertex without intravenous contrast. COMPARISON:  11/19/2012 FINDINGS: Brain: Stable age related cerebral atrophy, ventriculomegaly and periventricular white matter disease. No extra-axial fluid collections are identified. No CT findings for acute hemispheric infarction or intracranial hemorrhage. No mass lesions. Stable curvilinear cortical calcifications at the  right vertex. The brainstem and cerebellum are normal. Vascular: Stable vascular calcifications. No hyperdense vessels or aneurysm. Skull: No skull fracture or bone lesion. Sinuses/Orbits: The paranasal sinuses and mastoid air cells are clear. The globes are intact. Other: No scalp lesion or hematoma. IMPRESSION: 1. Stable age related cerebral atrophy, ventriculomegaly and periventricular white matter disease. 2. No acute intracranial findings or mass lesions. Electronically Signed   By: Marijo Sanes M.D.   On:  10/12/2016 16:13   US Renal  Result Date: 10/12/2016 CLINICAL DATA:  Urinary retention for 3 days EXAM: RENAL / URINARY TRACT ULTRASOUND COMPLETE COMPARISON:  06/02/2016 FINDINGS: Right Kidney: Length: 9.9 cm. Normal cortical thickness and echogenicity. No mass, hydronephrosis or shadowing calcification. Left Kidney: Length: 7.6 cm. Cortical atrophy. No gross mass or hydronephrosis. No shadowing calcification. Bladder: Decompressed by Foley catheter. Ureteral stent seen on the previous exam is not identified on the current study. IMPRESSION: Atrophic LEFT kidney. Otherwise negative exam. Electronically Signed   By: Lavonia Dana M.D.   On: 10/12/2016 20:16   Dg Knee Complete 4 Views Right  Result Date: 10/12/2016 CLINICAL DATA:  Fall today with generalized right knee pain. Insert image EXAM: RIGHT KNEE - COMPLETE 4+ VIEW COMPARISON:  None. FINDINGS: No evidence of fracture, dislocation, or joint effusion. Osteopenia and mild degenerative spurring. Atherosclerotic calcification. IMPRESSION: No acute finding. Electronically Signed   By: Monte Fantasia M.D.   On: 10/12/2016 16:20    Assessment: 83 year old female presenting with vertigo, gait unsteadiness and mild LUE ataxia 1. Examination reveals no nystagmus. However, there is mild LUE ataxia. DDx includes small acute/subacute left cerebellar stroke. 2. CT reveals stable age related cerebral atrophy, ventriculomegaly and periventricular white matter disease. No acute intracranial findings or mass lesions.  3. History of mild dementia; she has been more confused lately.  4. History of seizures. On Keppra for this.  Recommendations: 1. Continue ASA and atorvastatin.  2. Continue Keppra.  3. MRI and MRA brain. 4. Carotid ultrasound 5. TTE. 6. Cardiac telemetry 7. Consider a trial of Aricept 8. PT consult  Electronically signed: Dr. Kerney Elbe 10/13/2016, 5:20 AM

## 2016-10-14 ENCOUNTER — Observation Stay (HOSPITAL_COMMUNITY): Payer: Medicare Other

## 2016-10-14 DIAGNOSIS — R569 Unspecified convulsions: Secondary | ICD-10-CM | POA: Diagnosis not present

## 2016-10-14 DIAGNOSIS — N1330 Unspecified hydronephrosis: Secondary | ICD-10-CM | POA: Diagnosis not present

## 2016-10-14 DIAGNOSIS — F0390 Unspecified dementia without behavioral disturbance: Secondary | ICD-10-CM | POA: Diagnosis not present

## 2016-10-14 DIAGNOSIS — G459 Transient cerebral ischemic attack, unspecified: Secondary | ICD-10-CM | POA: Diagnosis not present

## 2016-10-14 DIAGNOSIS — R4182 Altered mental status, unspecified: Secondary | ICD-10-CM

## 2016-10-14 DIAGNOSIS — R338 Other retention of urine: Secondary | ICD-10-CM | POA: Diagnosis not present

## 2016-10-14 DIAGNOSIS — K219 Gastro-esophageal reflux disease without esophagitis: Secondary | ICD-10-CM | POA: Diagnosis not present

## 2016-10-14 DIAGNOSIS — J42 Unspecified chronic bronchitis: Secondary | ICD-10-CM | POA: Diagnosis not present

## 2016-10-14 LAB — URINE CULTURE

## 2016-10-14 LAB — ECHOCARDIOGRAM COMPLETE
HEIGHTINCHES: 61 in
WEIGHTICAEL: 1374.4 [oz_av]

## 2016-10-14 MED ORDER — ATORVASTATIN CALCIUM 40 MG PO TABS: 80.0000 mg | ORAL_TABLET | Freq: Every day | ORAL | 3 refills | Status: DC

## 2016-10-14 MED ORDER — TAMSULOSIN HCL 0.4 MG PO CAPS: 0.4000 mg | ORAL_CAPSULE | Freq: Every day | ORAL | 2 refills | Status: DC

## 2016-10-14 MED ORDER — ASPIRIN 325 MG PO TABS: 325.0000 mg | ORAL_TABLET | Freq: Every day | ORAL | 3 refills | Status: DC

## 2016-10-14 MED ORDER — ATORVASTATIN CALCIUM 40 MG PO TABS: 40.0000 mg | ORAL_TABLET | Freq: Every day | ORAL | 3 refills | Status: DC

## 2016-10-14 MED ORDER — LEVETIRACETAM 250 MG PO TABS
250.0000 mg | ORAL_TABLET | Freq: Two times a day (BID) | ORAL | 5 refills | Status: DC
Start: 2016-10-14 — End: 2017-06-17

## 2016-10-14 NOTE — Progress Notes (Signed)
EEG Completed; Results Pending  

## 2016-10-14 NOTE — Discharge Summary (Signed)
Physician Discharge Summary   Patient ID: Bridget Ball MRN: [id number] DOB/AGE: Nov 29, 1928 83 y.o.  Admit date: 10/12/2016 Discharge date: 10/14/2016  Primary Care Physician:  Celene Squibb, MD  Discharge Diagnoses:   . TIA (transient ischemic attack) . Acute urinary retention . COPD (chronic obstructive pulmonary disease) ([place]) . Dementia . GERD (gastroesophageal reflux disease) . Hydronephrosis, left    Consults:  neurology  Recommendations for Outpatient Follow-up:  1. PT evaluation recommended home health PT 2. Please repeat CBC/BMET at next visit   DIET: heart healthy diet    Allergies:   Allergies  Allergen Reactions  . Penicillins Anaphylaxis    Throat swelling as a child. Tolerated ceftriaxone 07/29/13.      DISCHARGE MEDICATIONS: Current Discharge Medication List    START taking these medications   Details  aspirin 325 MG tablet Take 1 tablet (325 mg total) by mouth daily. Qty: 30 tablet, Refills: 3    tamsulosin (FLOMAX) 0.4 MG CAPS capsule Take 1 capsule (0.4 mg total) by mouth daily. Qty: 30 capsule, Refills: 2      CONTINUE these medications which have CHANGED   Details  atorvastatin (LIPITOR) 40 MG tablet Take 1 tablet (40 mg total) by mouth at bedtime. Qty: 30 tablet, Refills: 3    levETIRAcetam (KEPPRA) 250 MG tablet Take 1 tablet (250 mg total) by mouth 2 (two) times daily. Qty: 60 tablet, Refills: 5      CONTINUE these medications which have NOT CHANGED   Details  albuterol (PROVENTIL HFA;VENTOLIN HFA) 108 (90 BASE) MCG/ACT inhaler Inhale 2 puffs into the lungs every 6 (six) hours as needed for wheezing or shortness of breath. Qty: 1 Inhaler, Refills: 0    budesonide-formoterol (SYMBICORT) 160-4.5 MCG/ACT inhaler Inhale 2 puffs into the lungs 2 (two) times daily. Qty: 1 Inhaler, Refills: 0    citalopram (CELEXA) 10 MG tablet Take 1 tablet (10 mg total) by mouth daily. Qty: 90 tablet, Refills: 2    docusate sodium (COLACE) 100 MG  capsule Take 200 mg by mouth daily.     feeding supplement, ENSURE ENLIVE, (ENSURE ENLIVE) LIQD Take 237 mLs by mouth 2 (two) times daily between meals. Qty: 237 mL, Refills: 12    Ferrous Sulfate 27 MG TABS Take 27 mg by mouth daily.    memantine (NAMENDA XR) 28 MG CP24 24 hr capsule Take 1 capsule (28 mg total) by mouth daily. Qty: 90 capsule, Refills: 3    Methylfol-Algae-B12-Acetylcyst (CEREFOLIN NAC) 6-90.314-2-600 MG TABS Take 1 tablet by mouth daily. Qty: 90 tablet, Refills: 3    naproxen sodium (ANAPROX) 220 MG tablet Take 440 mg by mouth daily as needed (HA, back pain).     pantoprazole (PROTONIX) 40 MG tablet Take 1 tablet (40 mg total) by mouth every other day. Qty: 45 tablet, Refills: 1    Potassium 99 MG TABS Take 99 mg by mouth daily.    SPIRIVA HANDIHALER 18 MCG inhalation capsule Place 1 capsule into inhaler and inhale daily. Refills: 1      STOP taking these medications     meloxicam (MOBIC) 7.5 MG tablet      VESICARE 10 MG tablet          Brief H and P: For complete details please refer to admission H and P, but in brief Bridget Ball is a 83 y.o. female with medical history significant of right popliteal aneurysm status post repair history of DVT on Xarelto, lymphocytic colitis followed by GI history of  subarachnoid bleed resulting in seizures. She presented to the history of intermittent vertigo, confusion, trouble ambulating. Patient felt this different from her usual vertigo.Also reported urinary frequency, urgency, difficulty emptying her bladder. At her baseline, has mild dementia.   Hospital Course:    TIA - Patient presented with intermittent vertigo, confusion and trouble or bleeding - MRI of the brain showed no acute findings or  infarct. Moderate chronic small vessel ischemia, generalized atrophy - LDL 59, hemoglobin A1c 5.5 - 2-D echo showed EF of 65-70% - Continue aspirin, statin - CT angiogram of the head and neck showed no emergent  finding, no flow-limiting stenosis or branch occlusion - PT recommended home health, no signs of vestibular dysfunction - Outpatient follow-up with neurology scheduled - EEG normal   Left Hydronephrosis Renal ultrasound showed atrophic left kidney, bladder decompressed by Foley catheter Follows Dr Luberta Robertson, Foley catheter was inserted, vesicare held, started on flomax Outpatient follow-up with urology   Mild acute metabolic encephalopathy - Resolved, history of underlying dementia, mainly complicated by acute urinary retention  COPD - Stable, continue Spiriva, symbicort, albuterol   Dementia - Stable, continue Namenda   GERD - Continue PPI   History of DVT/right popliteal artery aneurysm-  Patient was on xarelto, repeat lower extremity Doppler in April 2018, was negative for DVT, xarelto was discontinued. Status post repair with Dr. Trula Slade in 01/2016 Outpatient follow-up   Day of Discharge BP 109/75 (BP Location: Left Arm)   Pulse 83   Temp 98.3 F (36.8 C) (Oral)   Resp 20   Ht 5\' 1"  (1.549 m)   Wt 39 kg (85 lb 14.4 oz)   SpO2 95%   BMI 16.23 kg/m   Physical Exam: General: Alert and awake oriented x3 not in any acute distress. HEENT: anicteric sclera, pupils reactive to light and accommodation CVS: S1-S2 clear no murmur rubs or gallops Chest: clear to auscultation bilaterally, no wheezing rales or rhonchi Abdomen: soft nontender, nondistended, normal bowel sounds Extremities: no cyanosis, clubbing or edema noted bilaterally Neuro: Cranial nerves II-XII intact, no focal neurological deficits   The results of significant diagnostics from this hospitalization (including imaging, microbiology, ancillary and laboratory) are listed below for reference.    LAB RESULTS: Basic Metabolic Panel:  Recent Labs Lab 10/12/16 1637 10/13/16 0322  NA 138 138  K 4.0 3.9  CL 101 102  CO2 29 30  GLUCOSE 78 127*  BUN 19 19  CREATININE 0.95 0.89  CALCIUM 9.4 9.1   MG  --  2.0  PHOS  --  3.2   Liver Function Tests:  Recent Labs Lab 10/13/16 0322  AST 31  ALT 27  ALKPHOS 44  BILITOT 0.7  PROT 5.6*  ALBUMIN 3.4*   No results for input(s): LIPASE, AMYLASE in the last 168 hours. No results for input(s): AMMONIA in the last 168 hours. CBC:  Recent Labs Lab 10/12/16 1637 10/13/16 0322  WBC 7.3 5.9  NEUTROABS 5.5  --   HGB 13.3 12.1  HCT 41.3 37.8  MCV 101.0* 100.0  PLT 196 179   Cardiac Enzymes:  Recent Labs Lab 10/12/16 2322  TROPONINI <0.03   BNP: Invalid input(s): POCBNP CBG: No results for input(s): GLUCAP in the last 168 hours.  Significant Diagnostic Studies:  Ct Angio Head W Or Wo Contrast  Result Date: 10/13/2016 CLINICAL DATA:  Stroke follow-up. Transient strange feeling and wobbly gait. Currently back to baseline. EXAM: CT ANGIOGRAPHY HEAD AND NECK TECHNIQUE: Multidetector CT imaging of the head and  neck was performed using the standard protocol during bolus administration of intravenous contrast. Multiplanar CT image reconstructions and MIPs were obtained to evaluate the vascular anatomy. Carotid stenosis measurements (when applicable) are obtained utilizing NASCET criteria, using the distal internal carotid diameter as the denominator. CONTRAST:  50 cc Isovue 370 intravenous COMPARISON:  Head CT from yesterday FINDINGS: CTA NECK FINDINGS Aortic arch: Atherosclerotic plaque diffusely. Three vessel branching pattern. The brachiocephalic origin is partial covered. Right carotid system: Mild common carotid atherosclerotic calcification. Mild calcified plaque at the common carotid bifurcation. No stenosis or ulceration. Left carotid system: Mild calcified plaque at the common carotid origin and bifurcation without stenosis or ulceration. Negative for dissection or beading. Vertebral arteries: No proximal subclavian stenosis. Mild right vertebral origins stenosis. No narrowing of the left vertebral artery. Vertebral arteries are  codominant and symmetrically opacified. Skeleton: No acute or aggressive finding. Multilevel degenerative disc narrowing. Advanced facet arthropathy at C3-4 with mild anterolisthesis. Hypertrophic anterior arch of C1 which remodels the anterior dens. Other neck: No acute inflammation or aggressive process. Isthmic thyroid nodule measuring 14 mm, below size threshold for strict imaging follow-up based on consensus guidelines. Upper chest: No acute finding.  Emphysema. Review of the MIP images confirms the above findings CTA HEAD FINDINGS Anterior circulation: Atherosclerotic plaque on the carotid siphons. No branch occlusion or proximal flow limiting stenosis. Negative for aneurysm. Posterior circulation: Codominant vertebral arteries. Symmetric tapering of the V4 segment after dural penetration and pica branching. There is a proximal basilar fenestration. No superimposed focal stenosis or atheromatous irregularity. Symmetric opacification of posterior cerebral arteries. Venous sinuses: Patent on the delayed phase Anatomic variants: As above Delayed phase: No abnormal intracranial enhancement. Small volume linear calcification in the right parietal cortex from nonspecific remote insult. Review of the MIP images confirms the above findings IMPRESSION: 1. No emergent finding. 2. Mild for age cervical and intracranial atherosclerosis without flow limiting stenosis or branch occlusion. 3.  Emphysema (ICD10-J43.9). Electronically Signed   By: Monte Fantasia M.D.   On: 10/13/2016 17:15   Dg Chest 2 View  Result Date: 10/13/2016 CLINICAL DATA:  TIA. EXAM: CHEST  2 VIEW COMPARISON:  Chest x-ray dated November 08, 2015. FINDINGS: The cardiomediastinal silhouette is normal in size. Normal pulmonary vascularity. Atherosclerotic calcification of the aortic arch. The lungs remain hyperinflated with emphysematous changes. No focal consolidation, pleural effusion, or pneumothorax. Scattered pleuroparenchymal scarring is again  noted. No acute osseous abnormality. IMPRESSION: COPD.  No active cardiopulmonary disease. Electronically Signed   By: Titus Dubin M.D.   On: 10/13/2016 18:59   Dg Abd 1 View  Result Date: 10/12/2016 CLINICAL DATA:  Renal stent, vertigo, dizziness, obstipation, COPD, prior MI EXAM: ABDOMEN - 1 VIEW COMPARISON:  06/11/2015 FINDINGS: LEFT ureteral stent. Osseous demineralization. Atherosclerotic calcifications aorta and iliac arteries. Scattered stool and gas in colon. Nonobstructive bowel gas pattern without bowel dilatation or wall thickening. No definite urinary tract calcification. IMPRESSION: No acute abnormalities. Electronically Signed   By: Lavonia Dana M.D.   On: 10/12/2016 22:14   Ct Head Wo Contrast  Result Date: 10/12/2016 CLINICAL DATA:  Vertigo and dizziness for 2 days. EXAM: CT HEAD WITHOUT CONTRAST TECHNIQUE: Contiguous axial images were obtained from the base of the skull through the vertex without intravenous contrast. COMPARISON:  11/19/2012 FINDINGS: Brain: Stable age related cerebral atrophy, ventriculomegaly and periventricular white matter disease. No extra-axial fluid collections are identified. No CT findings for acute hemispheric infarction or intracranial hemorrhage. No mass lesions. Stable curvilinear cortical calcifications  at the right vertex. The brainstem and cerebellum are normal. Vascular: Stable vascular calcifications. No hyperdense vessels or aneurysm. Skull: No skull fracture or bone lesion. Sinuses/Orbits: The paranasal sinuses and mastoid air cells are clear. The globes are intact. Other: No scalp lesion or hematoma. IMPRESSION: 1. Stable age related cerebral atrophy, ventriculomegaly and periventricular white matter disease. 2. No acute intracranial findings or mass lesions. Electronically Signed   By: Marijo Sanes M.D.   On: 10/12/2016 16:13   Ct Angio Neck W Or Wo Contrast  Result Date: 10/13/2016 CLINICAL DATA:  Stroke follow-up. Transient strange feeling and  wobbly gait. Currently back to baseline. EXAM: CT ANGIOGRAPHY HEAD AND NECK TECHNIQUE: Multidetector CT imaging of the head and neck was performed using the standard protocol during bolus administration of intravenous contrast. Multiplanar CT image reconstructions and MIPs were obtained to evaluate the vascular anatomy. Carotid stenosis measurements (when applicable) are obtained utilizing NASCET criteria, using the distal internal carotid diameter as the denominator. CONTRAST:  50 cc Isovue 370 intravenous COMPARISON:  Head CT from yesterday FINDINGS: CTA NECK FINDINGS Aortic arch: Atherosclerotic plaque diffusely. Three vessel branching pattern. The brachiocephalic origin is partial covered. Right carotid system: Mild common carotid atherosclerotic calcification. Mild calcified plaque at the common carotid bifurcation. No stenosis or ulceration. Left carotid system: Mild calcified plaque at the common carotid origin and bifurcation without stenosis or ulceration. Negative for dissection or beading. Vertebral arteries: No proximal subclavian stenosis. Mild right vertebral origins stenosis. No narrowing of the left vertebral artery. Vertebral arteries are codominant and symmetrically opacified. Skeleton: No acute or aggressive finding. Multilevel degenerative disc narrowing. Advanced facet arthropathy at C3-4 with mild anterolisthesis. Hypertrophic anterior arch of C1 which remodels the anterior dens. Other neck: No acute inflammation or aggressive process. Isthmic thyroid nodule measuring 14 mm, below size threshold for strict imaging follow-up based on consensus guidelines. Upper chest: No acute finding.  Emphysema. Review of the MIP images confirms the above findings CTA HEAD FINDINGS Anterior circulation: Atherosclerotic plaque on the carotid siphons. No branch occlusion or proximal flow limiting stenosis. Negative for aneurysm. Posterior circulation: Codominant vertebral arteries. Symmetric tapering of the V4  segment after dural penetration and pica branching. There is a proximal basilar fenestration. No superimposed focal stenosis or atheromatous irregularity. Symmetric opacification of posterior cerebral arteries. Venous sinuses: Patent on the delayed phase Anatomic variants: As above Delayed phase: No abnormal intracranial enhancement. Small volume linear calcification in the right parietal cortex from nonspecific remote insult. Review of the MIP images confirms the above findings IMPRESSION: 1. No emergent finding. 2. Mild for age cervical and intracranial atherosclerosis without flow limiting stenosis or branch occlusion. 3.  Emphysema (ICD10-J43.9). Electronically Signed   By: Monte Fantasia M.D.   On: 10/13/2016 17:15   Mr Brain Wo Contrast  Result Date: 10/13/2016 CLINICAL DATA:  Ataxia, stroke suspected. EXAM: MRI HEAD WITHOUT CONTRAST TECHNIQUE: Multiplanar, multiecho pulse sequences of the brain and surrounding structures were obtained without intravenous contrast. COMPARISON:  Head CT and CTA from earlier today. Brain MRI 02/10/2013 FINDINGS: Brain: No acute infarction, hemorrhage, hydrocephalus, extra-axial collection or mass lesion. Extensive superficial siderosis along the cerebral convexities. Hemosiderin is most confluent at the frontal parietal vertex and in the high anterior left frontal convexity. No convincing progression since prior when allowing for differences in field strength. Few parenchymal hemorrhagic foci superficially, most notably in the anterior left frontal lobe. Patient has undergone previous vascular workup, including catheter angiography in 2010 and head CTA from earlier today.  Moderate chronic microvascular ischemic gliosis in the cerebral white matter. No definite progression since 2015. Cerebral atrophy that is mild to moderate for age. Vascular: CTA earlier today.  Normal flow voids. Skull and upper cervical spine: Negative for marrow lesion. Advanced cervical facet  arthropathy on the right at C3-4 with anterolisthesis. Sinuses/Orbits: Negative IMPRESSION: 1. No acute finding, including infarct. 2. Extensive superficial siderosis with stable pattern compared to 2015 brain MRI. No apparent source. Subarachnoid hemorrhage and superficial siderosis has been noted since 2010 at least; catheter angiography performed 01/25/2008. 3. Moderate chronic small vessel ischemia.  Generalized atrophy. Electronically Signed   By: Monte Fantasia M.D.   On: 10/13/2016 19:48   US Renal  Result Date: 10/12/2016 CLINICAL DATA:  Urinary retention for 3 days EXAM: RENAL / URINARY TRACT ULTRASOUND COMPLETE COMPARISON:  06/02/2016 FINDINGS: Right Kidney: Length: 9.9 cm. Normal cortical thickness and echogenicity. No mass, hydronephrosis or shadowing calcification. Left Kidney: Length: 7.6 cm. Cortical atrophy. No gross mass or hydronephrosis. No shadowing calcification. Bladder: Decompressed by Foley catheter. Ureteral stent seen on the previous exam is not identified on the current study. IMPRESSION: Atrophic LEFT kidney. Otherwise negative exam. Electronically Signed   By: Lavonia Dana M.D.   On: 10/12/2016 20:16   Dg Knee Complete 4 Views Right  Result Date: 10/12/2016 CLINICAL DATA:  Fall today with generalized right knee pain. Insert image EXAM: RIGHT KNEE - COMPLETE 4+ VIEW COMPARISON:  None. FINDINGS: No evidence of fracture, dislocation, or joint effusion. Osteopenia and mild degenerative spurring. Atherosclerotic calcification. IMPRESSION: No acute finding. Electronically Signed   By: Monte Fantasia M.D.   On: 10/12/2016 16:20    2D ECHO: Study Conclusions  - Left ventricle: The cavity size was normal. Systolic function was   vigorous. The estimated ejection fraction was in the range of 65%   to 70%. Wall motion was normal; there were no regional wall   motion abnormalities. There was an increased relative   contribution of atrial contraction to ventricular filling.    Doppler parameters are consistent with abnormal left ventricular   relaxation (grade 1 diastolic dysfunction). - Aortic valve: The AV is poorly visualized but there does appear   to be some calcification of the leaflets. Cannot determine   leaflet mobility from this study. - Pulmonary arteries: PA peak pressure: 32 mm Hg (S).  Disposition and Follow-up: Discharge Instructions    Diet - low sodium heart healthy    Complete by:  As directed    Increase activity slowly    Complete by:  As directed        DISPOSITION: home with home health   DISCHARGE FOLLOW-UP Follow-up Information    Celene Squibb, MD. Schedule an appointment as soon as possible for a visit in 2 week(s).   Specialty:  Internal Medicine Contact information: [place] [address] [phone]        Rosalin Hawking, MD. Schedule an appointment as soon as possible for a visit in 4 week(s).   Specialty:  Neurology Contact information: [place] [address] [phone]            Time spent on Discharge: 25 minutes  Signed:   Estill Cotta M.D. [facility] 10/14/2016, 11:58 AM Pager: [phone]

## 2016-10-14 NOTE — Care Management Note (Signed)
Case Management Note  Patient Details  Name: Bridget Ball MRN: [id number] Date of Birth: October 11, 1928  Subjective/Objective:                    Action/Plan: Pt discharging home with self care. Pt with orders for [facility] PT. CM spoke to patient and her daughter and daughter is refusing [place] services.  Daughter providing transportation home.   Expected Discharge Date:  10/14/16               Expected Discharge Plan:  [place]  In-House Referral:     Discharge planning Services  CM Consult  Post Acute Care Choice:  Home Health (daughter refused ) Choice offered to:  Patient, Adult Children  DME Arranged:    DME Agency:     HH Arranged:    [place] Agency:     Status of Service:  Completed, signed off  If discussed at [employer] of Stay Meetings, dates discussed:    Additional Comments:  Bridget Friar, RN 10/14/2016, 4:54 PM

## 2016-10-14 NOTE — Progress Notes (Signed)
STROKE TEAM PROGRESS NOTE   SUBJECTIVE (INTERVAL HISTORY) No family is at the bedside. Patient sitting in chair, no complains. EEG finished, normal study.   OBJECTIVE Temp:  [98.3 F (36.8 C)] 98.3 F (36.8 C) (10/10 0602) Pulse Rate:  [83-95] 83 (10/10 0602) Cardiac Rhythm: Normal sinus rhythm (10/10 0700) Resp:  [20] 20 (10/10 0602) BP: (102-123)/(67-76) 109/75 (10/10 0602) SpO2:  [93 %-97 %] 95 % (10/10 0809)  No results for input(s): GLUCAP in the last 168 hours.  Recent Labs Lab 10/12/16 1637 10/13/16 0322  NA 138 138  K 4.0 3.9  CL 101 102  CO2 29 30  GLUCOSE 78 127*  BUN 19 19  CREATININE 0.95 0.89  CALCIUM 9.4 9.1  MG  --  2.0  PHOS  --  3.2    Recent Labs Lab 10/13/16 0322  AST 31  ALT 27  ALKPHOS 44  BILITOT 0.7  PROT 5.6*  ALBUMIN 3.4*    Recent Labs Lab 10/12/16 1637 10/13/16 0322  WBC 7.3 5.9  NEUTROABS 5.5  --   HGB 13.3 12.1  HCT 41.3 37.8  MCV 101.0* 100.0  PLT 196 179    Recent Labs Lab 10/12/16 2322  TROPONINI <0.03   No results for input(s): LABPROT, INR in the last 72 hours.  Recent Labs  10/12/16 1524 10/13/16 0309  COLORURINE YELLOW YELLOW  LABSPEC 1.010 1.019  PHURINE 7.0 6.0  GLUCOSEU NEGATIVE NEGATIVE  HGBUR NEGATIVE SMALL*  BILIRUBINUR NEGATIVE NEGATIVE  KETONESUR NEGATIVE 5*  PROTEINUR NEGATIVE 30*  NITRITE NEGATIVE NEGATIVE  LEUKOCYTESUR SMALL* LARGE*       Component Value Date/Time   CHOL 147 10/13/2016 0322   TRIG 66 10/13/2016 0322   HDL 75 10/13/2016 0322   CHOLHDL 2.0 10/13/2016 0322   VLDL 13 10/13/2016 0322   LDLCALC 59 10/13/2016 0322   Lab Results  Component Value Date   HGBA1C 5.5 10/13/2016      Component Value Date/Time   LABOPIA NONE DETECTED 11/19/2012 1653   COCAINSCRNUR NONE DETECTED 11/19/2012 1653   LABBENZ NONE DETECTED 11/19/2012 1653   AMPHETMU NONE DETECTED 11/19/2012 1653   THCU NONE DETECTED 11/19/2012 1653   LABBARB NONE DETECTED 11/19/2012 1653    No results for  input(s): ETH in the last 168 hours.  I have personally reviewed the radiological images below and agree with the radiology interpretations.  Ct Head Wo Contrast 10/12/2016 IMPRESSION: 1. Stable age related cerebral atrophy, ventriculomegaly and periventricular white matter disease. 2. No acute intracranial findings or mass lesions.   US Renal 10/12/2016 IMPRESSION: Atrophic LEFT kidney. Otherwise negative exam. Electronically Signed   By: Lavonia Dana M.D.   On: 10/12/2016 20:16   Ct Angio Head and neck W Or Wo Contrast 10/13/2016 IMPRESSION: 1. No emergent finding. 2. Mild for age cervical and intracranial atherosclerosis without flow limiting stenosis or branch occlusion. 3.  Emphysema (ICD10-J43.9).   Dg Chest 2 View 10/13/2016 IMPRESSION: COPD.  No active cardiopulmonary disease.   Mr Brain Wo Contrast 10/13/2016 IMPRESSION: 1. No acute finding, including infarct. 2. Extensive superficial siderosis with stable pattern compared to 2015 brain MRI. No apparent source. Subarachnoid hemorrhage and superficial siderosis has been noted since 2010 at least; catheter angiography performed 01/25/2008. 3. Moderate chronic small vessel ischemia.  Generalized atrophy.    2D Echocardiogram  - Left ventricle: The cavity size was normal. Systolic function was   vigorous. The estimated ejection fraction was in the range of 65%   to 70%. Wall  motion was normal; there were no regional wall   motion abnormalities. There was an increased relative   contribution of atrial contraction to ventricular filling.   Doppler parameters are consistent with abnormal left ventricular   relaxation (grade 1 diastolic dysfunction). - Aortic valve: The AV is poorly visualized but there does appear   to be some calcification of the leaflets. Cannot determine   leaflet mobility from this study. - Pulmonary arteries: PA peak pressure: 32 mm Hg (S).  EEG normal study   PHYSICAL EXAM  Temp:  [98.3 F (36.8 C)] 98.3  F (36.8 C) (10/10 0602) Pulse Rate:  [83-95] 83 (10/10 0602) Resp:  [20] 20 (10/10 0602) BP: (102-123)/(67-76) 109/75 (10/10 0602) SpO2:  [93 %-97 %] 95 % (10/10 0809)  General - Well nourished, well developed, in no apparent distress.  Ophthalmologic - Sharp disc margins OU.  Cardiovascular - Regular rate and rhythm with no murmur.  Mental Status -  Level of arousal and orientation to time, place, and person were intact. Language including expression, naming, repetition, comprehension was assessed and found intact. Fund of Knowledge was assessed and was intact.  Cranial Nerves II - XII - II - Visual field intact OU. III, IV, VI - Extraocular movements intact. V - Facial sensation intact bilaterally. VII - Facial movement intact bilaterally. VIII - Hearing & vestibular intact bilaterally. X - Palate elevates symmetrically. XI - Chin turning & shoulder shrug intact bilaterally XII - Tongue protrusion intact.  Motor Strength - The patient's strength was normal in all extremities and pronator drift was absent.  Bulk was normal and fasciculations were absent.   Motor Tone - Muscle tone was assessed at the neck and appendages and was normal.  Reflexes - The patient's reflexes were symmetrical in all extremities and she had no pathological reflexes.  Sensory - Light touch, temperature/pinprick were assessed and were symmetrical.    Coordination - The patient had normal movements in the hands and feet with no ataxia or dysmetria.  Tremor was absent.  Gait and Station - deferred   ASSESSMENT/PLAN [name] is a 83 y.o. female with history of HLD, CAD/MI, DVT, s/p right popliteal aneurysm repair, s/p nephrectomy tube placement, dementia on namenda, SAH with seizure in 2014 following with Dr. Leonie Ball admitted for difficulty with urination. Had symptoms of transient strange feeling and slight wobbly gait.    Possible encephalopathy in the setting of urinary difficulty    Resultant - strange feeling and slight wobbly gait resolved.  MRI no acute infarct  CTA head and neck - unremarkable  2D Echo EF 65-70%  EEG normal  LDL 59  HgbA1c 5.5  SCDs for VTE prophylaxis Diet Heart Room service appropriate? Yes; Fluid consistency: Thin  Diet - low sodium heart healthy   No antithrombotic prior to admission, now on aspirin 325 mg daily. Continue ASA on discharge.  Patient counseled to be compliant with her antithrombotic medications  Ongoing aggressive stroke risk factor management  Therapy recommendations:  pending  Disposition:  pending  Acute urinary retention  Hx of hydronephrosis   S/p nephrostomy tube  Management as per primary team  Hyperlipidemia  Home meds:  lipitor 80   Currently on lipitor 40  LDL 59, goal < 70  Continue statin at discharge  Hx of right popliteal artery aneurysm  S/p repair with Dr. Trula Slade in 01/2016  Had RLE DVT due to the aneurysm - was on Xarelto  Repeat LE venous doppler 04/2016 - negative  DVT - Xarelto stopped  Follow up with Dr. Trula Slade later this month to repeat RLE testing  Hx of SAH with seizure in 11/2012  EEG negative at that time  Was on keppra and depakote  depakote discontinued  Still on keppra 250mg  bid  Continue keppra   Other Stroke Risk Factors  Advanced age  quit smoking 18 years ago  ETOH use - recommend < 1 drink per day  Other Active Problems  Malnutrition - Body mass index is 16.23 kg/m.   Dementia - on namenda   Follows with Dr. Leonie Ball and Cecille Rubin NP at [facility] on 10/20/16  Hospital day # 0  Neurology will sign off. Please call with questions. Pt will follow up with Cecille Rubin, NP, at [facility] on 10/20/16. Thanks for the consult.  Rosalin Hawking, MD PhD Stroke Neurology 10/14/2016 5:27 PM    To contact Stroke Continuity provider, please refer to [email or web address] After hours, contact General Neurology

## 2016-10-14 NOTE — Progress Notes (Signed)
SLP Cancellation Note  Patient Details Name: Bridget Ball MRN: [id number] DOB: 08-15-1928   Cancelled treatment:       Reason Eval/Treat Not Completed: SLP screened, no needs identified, will sign off   Daquisha Clermont, Katherene Ponto 10/14/2016, 7:52 AM

## 2016-10-14 NOTE — Procedures (Signed)
ELECTROENCEPHALOGRAM REPORT  Date of Study: 10/14/2016  Patient's Name: Bridget Ball MRN: [id number] Date of Birth: September 29, 1928  Referring Provider: Dr. Rosalin Hawking  Clinical History: This is an 83 year old woman with altered mental status.  Medications: levETIRAcetam (KEPPRA) tablet 250 mg  acetaminophen (TYLENOL) tablet 650 mg  aspirin tablet 325 mg  atorvastatin (LIPITOR) tablet 40 mg  bisacodyl (DULCOLAX) suppository 10 mg  citalopram (CELEXA) tablet 10 mg  HYDROcodone-acetaminophen (NORCO/VICODIN) 5-325 MG per tablet 1-2 tablet  memantine (NAMENDA XR) 24 hr capsule 28 mg  mometasone-formoterol (DULERA) 200-5 MCG/ACT inhaler 2 puff  pantoprazole (PROTONIX) EC tablet 40 mg  polyethylene glycol (MIRALAX / GLYCOLAX) packet 17 g  senna-docusate (Senokot-S) tablet 1 tablet  tamsulosin (FLOMAX) capsule 0.4 mg   Technical Summary: A multichannel digital EEG recording measured by the international 10-20 system with electrodes applied with paste and impedances below 5000 ohms performed in our laboratory with EKG monitoring in an awake and drowsy patient.  Hyperventilation and photic stimulation were not performed.  The digital EEG was referentially recorded, reformatted, and digitally filtered in a variety of bipolar and referential montages for optimal display.    Description: The patient is awake and drowsy during the recording.  During maximal wakefulness, there is a symmetric, medium voltage 9 Hz posterior dominant rhythm that attenuates with eye opening.  The record is symmetric.  During drowsiness, there is an increase in theta slowing of the background. Deeper stages of sleep were not seen.  Hyperventilation and photic stimulation were not performed.  There were no epileptiform discharges or electrographic seizures seen.    EKG lead was unremarkable.  Impression: This awake and drowsy EEG is normal.    Clinical Correlation: A normal EEG does not exclude a clinical diagnosis of  epilepsy. Clinical correlation is advised.   Ellouise Newer, M.D.

## 2016-10-19 NOTE — Progress Notes (Signed)
[facility]  PATIENT: Bridget Ball DOB: Aug 31, 1928   REASON FOR VISIT: Hospital follow-up for admission difficulty with urination had symptoms of transient strange feel of slight wobbly  Gait,seen here in the past for dementia and seizure disorder HISTORY FROM:patient and daughter    HISTORY OF PRESENT ILLNESS:Update 3/27/2018PS  : She returns for follow-up after last visit to 6 months ago. He is recumbent in by daughter. She is tolerated Namenda 10 mg twice daily without side effects. Dr. Oneida Alar memory difficulties about the same and at times she feels she may be slightly worse. Patient has had a few episodes of transient left face and hand numbness lasting about 10 minutes. She had 2 or 3 of these episodes several months ago but not recently. She remains on Xarelto which is taking every day. She states her blood pressure is well controlled. She had lipid profile checked by primary physician but cannot to me when but it was apparently satisfactory. She's had no recent seizures. She remains on Keppra 500 twice daily and started eating well without any side effects.  10/12/16 FROM RECORD Patient was admitted for difficulty with urination.Her therapist is at the bedside.  Overall she feels her condition is completely resolved. She stated that she had difficulty with urination yesterday and she also had strange feelings at that time, felt like people and space were going away from her but actually people were coming over to her. She denies room spinning, denies dizziness. She did felt her gait was wobbly yesterday. Currently her symptoms resolved. Interval history 10/16/2018CM Bridget Ball returns for hospital follow-up. MRI no acute infarct. 2-D echo with EF 65-70%. CTA head and neck unremarkable. EEG. LDL 59. Hemoglobin A1c 5.5. No anti thrombotic prior to admission now on aspirin 325 daily, but not taking . She has a history of dementia. MMSE today 25/30. She also has history of  seizure disorder and is currently on Keppra 250mg   twice daily. She is previously being seen in this office by Dr. Leonie Man and Ward Givens nurse practitionershe returns for reevaluation REVIEW OF SYSTEMS: Full 14 system review of systems performed and notable only for those listed, all others are neg:  Constitutional: neg  Cardiovascular: neg Ear/Nose/Throat: neg  Skin: neg Eyes: neg Respiratory: neg Gastroitestinal: difficulty urinating  Hematology/Lymphatic: neg  Endocrine: neg Musculoskeletal:neg Allergy/Immunology: neg Neurological: memory loss, seizure disorder Psychiatric: neg Sleep : neg   ALLERGIES: Allergies  Allergen Reactions  . Penicillins Anaphylaxis    Throat swelling as a child. Tolerated ceftriaxone 07/29/13.     HOME MEDICATIONS: Outpatient Medications Prior to Visit  Medication Sig Dispense Refill  . albuterol (PROVENTIL HFA;VENTOLIN HFA) 108 (90 BASE) MCG/ACT inhaler Inhale 2 puffs into the lungs every 6 (six) hours as needed for wheezing or shortness of breath. 1 Inhaler 0  . atorvastatin (LIPITOR) 40 MG tablet Take 1 tablet (40 mg total) by mouth at bedtime. 30 tablet 3  . budesonide-formoterol (SYMBICORT) 160-4.5 MCG/ACT inhaler Inhale 2 puffs into the lungs 2 (two) times daily. 1 Inhaler 0  . citalopram (CELEXA) 10 MG tablet Take 1 tablet (10 mg total) by mouth daily. 90 tablet 2  . docusate sodium (COLACE) 100 MG capsule Take 200 mg by mouth daily.     . feeding supplement, ENSURE ENLIVE, (ENSURE ENLIVE) LIQD Take 237 mLs by mouth 2 (two) times daily between meals. 237 mL 12  . Ferrous Sulfate 27 MG TABS Take 27 mg by mouth daily.    Marland Kitchen levETIRAcetam (  KEPPRA) 250 MG tablet Take 1 tablet (250 mg total) by mouth 2 (two) times daily. 60 tablet 5  . memantine (NAMENDA XR) 28 MG CP24 24 hr capsule Take 1 capsule (28 mg total) by mouth daily. 90 capsule 3  . Methylfol-Algae-B12-Acetylcyst (CEREFOLIN NAC) 6-90.314-2-600 MG TABS Take 1 tablet by mouth daily. 90  tablet 3  . naproxen sodium (ANAPROX) 220 MG tablet Take 440 mg by mouth daily as needed (HA, back pain).     . pantoprazole (PROTONIX) 40 MG tablet Take 1 tablet (40 mg total) by mouth every other day. 45 tablet 1  . Potassium 99 MG TABS Take 99 mg by mouth daily.    Marland Kitchen SPIRIVA HANDIHALER 18 MCG inhalation capsule Place 1 capsule into inhaler and inhale daily.  1  . tamsulosin (FLOMAX) 0.4 MG CAPS capsule Take 1 capsule (0.4 mg total) by mouth daily. 30 capsule 2  . aspirin 325 MG tablet Take 1 tablet (325 mg total) by mouth daily. 30 tablet 3   No facility-administered medications prior to visit.     PAST MEDICAL HISTORY: Past Medical History:  Diagnosis Date  . Anxiety   . Arthritis   . Attention to nephrostomy ([place])    PT HAS NEPHROSTOMY TUBE IN PLACE  . Bruises easily   . Cancer [facility])    Skin cancer  . Chronic diarrhea   . Colitis   . COPD (chronic obstructive pulmonary disease) ([place])   . Dementia   . Depression   . GERD (gastroesophageal reflux disease)   . Hiatal hernia   . Hx of bronchitis   . Hx of pulmonary edema JULY 2015  . Hx of pulmonary edema JULY 2015  . Hx of septic shock JULY 2015  . Hydronephrosis, left   . Hyperlipidemia   . Memory difficulties   . Microscopic colitis   . Myocardial infarction [facility])    unknown time   . Renal insufficiency   . Seizures ([place])    last seizure was 4-5 years ago with "brain bleed"  . Shortness of breath    with exertion  . Stroke [facility])    TIA four years ago / stroke NOV 2014    PAST SURGICAL HISTORY: Past Surgical History:  Procedure Laterality Date  . APPENDECTOMY    . BREAST BIOPSY Right 02/02/2014   Procedure: RIGHT BREAST BIOPSY;  Surgeon: Jamesetta So, MD;  Location: [facility] ORS;  Service: General;  Laterality: Right;  . COLONOSCOPY N/A 05/24/2013   Procedure: COLONOSCOPY;  Surgeon: Rogene Houston, MD;  Location: [facility] END[facility] SU[facility];  Service: Endoscopy;  Laterality: N/A;  100  . CYSTOSCOPY W/ URETERAL STENT PLACEMENT  Left 08/06/2015   Procedure: CYSTOSCOPY WITH LEFT URETERAL STENT EXCHANGE - Sammie Bench;  Surgeon: Franchot Gallo, MD;  Location: [facility];  Service: Urology;  Laterality: Left;  . CYSTOSCOPY W/ URETERAL STENT PLACEMENT Left 07/07/2016   Procedure: CYSTOSCOPY, LEFT RETROGRADE PYELOGRAM WITH LEFT URETERAL STENT REPLACEMENT;  Surgeon: Franchot Gallo, MD;  Location: [facility];  Service: Urology;  Laterality: Left;  . CYSTOSCOPY WITH RETROGRADE PYELOGRAM, URETEROSCOPY AND STENT PLACEMENT Left 09/18/2013   Procedure: CYSTOSCOPY WITH RETROGRADE PYELOGRAM,  URETEROSCOPY AND  STENT PLACEMENT, removal of nephrostomy tube;  Surgeon: Jorja Loa, MD;  Location: [facility];  Service: Urology;  Laterality: Left;  . CYSTOSCOPY WITH STENT PLACEMENT Left 09/04/2014   Procedure: CYSTOSCOPY WITH LEFT J2 STENT EXCHANGE;  Surgeon: Franchot Gallo, MD;  Location: [facility];  Service: Urology;  Laterality: Left;  .  ENDOVASCULAR REPAIR OF POPLITEAL ARTERY ANEURYSM  02/05/2016  . ESOPHAGOGASTRODUODENOSCOPY N/A 05/24/2013   Procedure: ESOPHAGOGASTRODUODENOSCOPY (EGD);  Surgeon: Rogene Houston, MD;  Location[facility];  Service: Endoscopy;  Laterality: N/A;  . FALSE ANEURYSM REPAIR Right 02/05/2016   Procedure: REPAIR RIGHT POLITEAL  ANEURYSM;  Surgeon: Serafina Mitchell, MD;  Location: [place];  Service: Vascular;  Laterality: Right;  . KNEE ARTHROSCOPY Right   . SKIN CANCER EXCISION    . TONSILLECTOMY    . TUBAL LIGATION      FAMILY HISTORY: Family History  Problem Relation Age of Onset  . Diabetes Mother   . Stroke Mother   . Heart attack Father     SOCIAL HISTORY: Social History   Social History  . Marital status: Widowed    Spouse name: N/A  . Number of children: 6  . Years of education: college   Occupational History  . retired    Social History Main Topics  . Smoking status: Former Smoker    Packs/day: 1.00    Years: 50.00    Types: Cigarettes    Quit date: 11/13/1997  . Smokeless tobacco: Never  Used  . Alcohol use 4.2 oz/week    7 Glasses of wine per week     Comment: 2 glasses a night wine (none in a year)  . Drug use: No  . Sexual activity: Not Currently   Other Topics Concern  . Not on file   Social History Narrative   Patient is widowed with 6 children.   Patient is right handed.   Patient has college education.   Patient drinks 4 cups daily.     PHYSICAL EXAM  Vitals:   10/20/16 1521  BP: 97/63  Pulse: 90  Weight: 101 lb (45.8 kg)  Height: 5\' 1"  (1.549 m)   Body mass index is 19.08 kg/m.  Generalized: Well developed frail female no acute distress  Head: normocephalic and atraumatic,. Oropharynx benign  Neck: Supple, no carotid bruits  Cardiac: Regular rate rhythm, no murmur  Musculoskeletal: No deformity   Neurological examination   Mentation: Alert.AFT 7. Clock drawing 2/4 MMSE - Mini Mental State Exam 10/20/2016 03/31/2016 09/26/2015  Orientation to time 5 2 4   Orientation to Place 4 5 3   Registration 3 3 3   Attention/ Calculation 4 5 5   Recall 1 0 1  Language- name 2 objects 2 2 2   Language- repeat 1 1 1   Language- follow 3 step command 3 3 3   Language- read & follow direction 1 1 1   Write a sentence 1 1 1   Copy design 0 0 1  Total score 25 23 25    .  Follows all commands speech and language fluent.   Cranial nerve II-XII: Pupils were equal round reactive to light extraocular movements were full, visual field were full on confrontational test. Facial sensation and strength were normal. hearing was intact to finger rubbing bilaterally. Uvula tongue midline. head turning and shoulder shrug were normal and symmetric.Tongue protrusion into cheek strength was normal. Motor: normal bulk and tone, full strength in the BUE, BLE,  Sensory: normal and symmetric to light touch, pinprick, and  Vibration, in the upper and lower extremities  Coordination: finger-nose-finger, heel-to-shin bilaterally, no dysmetria Reflexes: 1 + upper lower and symmetric,   plantar responses were flexor bilaterally. Gait and Station: Rising up from seated position without assistance, normal stance,  Gait is steady no assistive device DIAGNOSTIC DATA (LABS, IMAGING, TESTING) - I reviewed patient records,  labs, notes, testing and imaging myself where available.  Lab Results  Component Value Date   WBC 5.9 10/13/2016   HGB 12.1 10/13/2016   HCT 37.8 10/13/2016   MCV 100.0 10/13/2016   PLT 179 10/13/2016      Component Value Date/Time   NA 138 10/13/2016 0322   NA 142 06/24/2015 1425   K 3.9 10/13/2016 0322   CL 102 10/13/2016 0322   CO2 30 10/13/2016 0322   GLUCOSE 127 (H) 10/13/2016 0322   BUN 19 10/13/2016 0322   BUN 16 06/24/2015 1425   CREATININE 0.89 10/13/2016 0322   CALCIUM 9.1 10/13/2016 0322   PROT 5.6 (L) 10/13/2016 0322   PROT 6.2 06/24/2015 1425   ALBUMIN 3.4 (L) 10/13/2016 0322   ALBUMIN 4.0 06/24/2015 1425   AST 31 10/13/2016 0322   ALT 27 10/13/2016 0322   ALKPHOS 44 10/13/2016 0322   BILITOT 0.7 10/13/2016 0322   BILITOT 0.3 06/24/2015 1425   GFRNONAA 57 (L) 10/13/2016 0322   GFRAA >60 10/13/2016 0322   Lab Results  Component Value Date   CHOL 147 10/13/2016   HDL 75 10/13/2016   LDLCALC 59 10/13/2016   TRIG 66 10/13/2016   CHOLHDL 2.0 10/13/2016   Lab Results  Component Value Date   HGBA1C 5.5 10/13/2016   No results found for: [id number] Lab Results  Component Value Date   TSH 0.984 10/13/2016      ASSESSMENT AND PLAN  83 y.o. year old female  has a past medical history of Anxiety; Arthritis; Attention to nephrostomy [facility]); Bruises easily; Cancer ([place]); Chronic diarrhea; Colitis; COPD (chronic obstructive pulmonary disease) ([place]); Dementia; Depression; GERD (gastroesophageal reflux disease); Hiatal hernia; bronchitis; pulmonary edema (JULY 2015); pulmonary edema (JULY 2015); septic shock (JULY 2015); Hydronephrosis, left; Hyperlipidemia; Memory difficulties; Microscopic colitis; Myocardial infarction ([place]); Renal  insufficiency; Seizures ([place]); Shortness of breath; and Stroke ([place]). here hospital follow-up problems with urination Stroke ruled out   PLAN: Restart Aspirin 325 mg daily EC for stroke prevention  continue Namenda XR  28 mg daily  continue Keppra 250 twice daily Call for any seizure activity  continue Lipitor 40 mg daily  continue follow-up with Dr.Brabham  follow-up in 6 months next with Megan who has seen her in the past I spent  in total face to face time with the patient more than 50% of which was spent counseling and coordination of care, reviewing hospital record and test results reviewing medications and discussing and reviewing the diagnosis of memory loss seizure disorder and history of stroke and management of risk factors , Dennie Bible, [facility], [facility], APRN  [facility] [address] [place], [place] [address] [phone]

## 2016-10-20 ENCOUNTER — Encounter: Payer: Self-pay | Admitting: Nurse Practitioner

## 2016-10-20 ENCOUNTER — Ambulatory Visit (INDEPENDENT_AMBULATORY_CARE_PROVIDER_SITE_OTHER): Payer: Medicare Other | Admitting: Nurse Practitioner

## 2016-10-20 VITALS — BP 97/63 | HR 90 | Ht 61.0 in | Wt 101.0 lb

## 2016-10-20 DIAGNOSIS — E785 Hyperlipidemia, unspecified: Secondary | ICD-10-CM | POA: Diagnosis not present

## 2016-10-20 DIAGNOSIS — R569 Unspecified convulsions: Secondary | ICD-10-CM | POA: Diagnosis not present

## 2016-10-20 DIAGNOSIS — R269 Unspecified abnormalities of gait and mobility: Secondary | ICD-10-CM | POA: Diagnosis not present

## 2016-10-20 DIAGNOSIS — R413 Other amnesia: Secondary | ICD-10-CM

## 2016-10-20 DIAGNOSIS — Z8673 Personal history of transient ischemic attack (TIA), and cerebral infarction without residual deficits: Secondary | ICD-10-CM | POA: Diagnosis not present

## 2016-10-20 NOTE — Patient Instructions (Signed)
Restart Aspirin 325 mg daily EC  continue Namenda XR  28 mg daily  continue Keppra 250 twice daily Call for any seizure activity  continue Lipitor 40 mg daily  continue follow-up with Dr.Brabham  follow-up in 6 months next with Megan who has seen her in the past

## 2016-10-21 NOTE — Progress Notes (Signed)
I reviewed above note and agree with the assessment and plan.  Rosalin Hawking, MD PhD Stroke Neurology 10/21/2016 3:17 PM

## 2016-10-23 DIAGNOSIS — S81802A Unspecified open wound, left lower leg, initial encounter: Secondary | ICD-10-CM | POA: Diagnosis not present

## 2016-10-28 ENCOUNTER — Ambulatory Visit (HOSPITAL_COMMUNITY): Payer: Medicare Other | Attending: Internal Medicine | Admitting: Physical Therapy

## 2016-10-28 DIAGNOSIS — X58XXXS Exposure to other specified factors, sequela: Secondary | ICD-10-CM | POA: Insufficient documentation

## 2016-10-28 DIAGNOSIS — F0390 Unspecified dementia without behavioral disturbance: Secondary | ICD-10-CM | POA: Diagnosis not present

## 2016-10-28 DIAGNOSIS — M79662 Pain in left lower leg: Secondary | ICD-10-CM | POA: Insufficient documentation

## 2016-10-28 DIAGNOSIS — K219 Gastro-esophageal reflux disease without esophagitis: Secondary | ICD-10-CM | POA: Diagnosis not present

## 2016-10-28 DIAGNOSIS — J449 Chronic obstructive pulmonary disease, unspecified: Secondary | ICD-10-CM | POA: Insufficient documentation

## 2016-10-28 DIAGNOSIS — E785 Hyperlipidemia, unspecified: Secondary | ICD-10-CM | POA: Diagnosis not present

## 2016-10-28 DIAGNOSIS — S81802S Unspecified open wound, left lower leg, sequela: Secondary | ICD-10-CM

## 2016-10-28 DIAGNOSIS — Z8673 Personal history of transient ischemic attack (TIA), and cerebral infarction without residual deficits: Secondary | ICD-10-CM | POA: Diagnosis not present

## 2016-10-28 NOTE — Therapy (Signed)
[place], [place], [address] Phone: [phone]   Fax:  [phone]  Wound Care Evaluation  Patient Details  Name: Bridget Ball MRN: [id number] Date of Birth: Feb 24, 1928 No Data Recorded  Encounter Date: 10/28/2016      PT End of Session - 10/28/16 1212    Visit Number 1   Number of Visits 8   Date for PT Re-Evaluation 11/27/16   Authorization Type [employer] medicare   Authorization - Visit Number 1   Authorization - Number of Visits 8   PT Start Time 1120   PT Stop Time 1155   PT Time Calculation (min) 35 min   Behavior During Therapy [facility] for tasks assessed/performed      Past Medical History:  Diagnosis Date  . Anxiety   . Arthritis   . Attention to nephrostomy ([place])    PT HAS NEPHROSTOMY TUBE IN PLACE  . Bruises easily   . Cancer [facility])    Skin cancer  . Chronic diarrhea   . Colitis   . COPD (chronic obstructive pulmonary disease) ([place])   . Dementia   . Depression   . GERD (gastroesophageal reflux disease)   . Hiatal hernia   . Hx of bronchitis   . Hx of pulmonary edema JULY 2015  . Hx of pulmonary edema JULY 2015  . Hx of septic shock JULY 2015  . Hydronephrosis, left   . Hyperlipidemia   . Memory difficulties   . Microscopic colitis   . Myocardial infarction [facility])    unknown time   . Renal insufficiency   . Seizures ([place])    last seizure was 4-5 years ago with "brain bleed"  . Shortness of breath    with exertion  . Stroke [facility])    TIA four years ago / stroke NOV 2014    Past Surgical History:  Procedure Laterality Date  . APPENDECTOMY    . BREAST BIOPSY Right 02/02/2014   Procedure: RIGHT BREAST BIOPSY;  Surgeon: Jamesetta So, MD;  Location: [facility] ORS;  Service: General;  Laterality: Right;  . COLONOSCOPY N/A 05/24/2013   Procedure: COLONOSCOPY;  Surgeon: Rogene Houston, MD;  Location: [facility] ENDO SUITE;  Service: Endoscopy;  Laterality: N/A;  100  . CYSTOSCOPY W/ URETERAL STENT  PLACEMENT Left 08/06/2015   Procedure: CYSTOSCOPY WITH LEFT URETERAL STENT EXCHANGE - Sammie Bench;  Surgeon: Franchot Gallo, MD;  Location: [facility] ORS;  Service: Urology;  Laterality: Left;  . CYSTOSCOPY W/ URETERAL STENT PLACEMENT Left 07/07/2016   Procedure: CYSTOSCOPY, LEFT RETROGRADE PYELOGRAM WITH LEFT URETERAL STENT REPLACEMENT;  Surgeon: Franchot Gallo, MD;  Location: [facility] ORS;  Service: Urology;  Laterality: Left;  . CYSTOSCOPY WITH RETROGRADE PYELOGRAM, URETEROSCOPY AND STENT PLACEMENT Left 09/18/2013   Procedure: CYSTOSCOPY WITH RETROGRADE PYELOGRAM,  URETEROSCOPY AND  STENT PLACEMENT, removal of nephrostomy tube;  Surgeon: Jorja Loa, MD;  Location: [facility] ORS;  Service: Urology;  Laterality: Left;  . CYSTOSCOPY WITH STENT PLACEMENT Left 09/04/2014   Procedure: CYSTOSCOPY WITH LEFT J2 STENT EXCHANGE;  Surgeon: Franchot Gallo, MD;  Location: [facility] ORS;  Service: Urology;  Laterality: Left;  . ENDOVASCULAR REPAIR OF POPLITEAL ARTERY ANEURYSM  02/05/2016  . ESOPHAGOGASTRODUODENOSCOPY N/A 05/24/2013   Procedure: ESOPHAGOGASTRODUODENOSCOPY (EGD);  Surgeon: Rogene Houston, MD;  Location: [facility] END[facility] SUITE;  Service: Endoscopy;  Laterality: N/A;  . FALSE ANEURYSM REPAIR Right 02/05/2016   Procedure: REPAIR RIGHT POLITEAL  ANEURYSM;  Surgeon: Serafina Mitchell, MD;  Location:  [facility] OR;  Service: Vascular;  Laterality: Right;  . KNEE ARTHROSCOPY Right   . SKIN CANCER EXCISION    . TONSILLECTOMY    . TUBAL LIGATION      There were no vitals filed for this visit.         Wound Therapy - 10/28/16 1159    Subjective Pt's daughter states that her mother started to fall but she was able to catch her before she hit the ground last week.  During this incident her Rt leg hit a table and a bruise came up almost immediately.  The bruise became a wound and then it started looking bad therefore she sought medical advice who referred her to physical therapy.  The pt has a history of cellulitis from non healing  wounds.    Patient and Family Stated Goals wound to heal   Date of Onset 11/19/16   Prior Treatments self care    Pain Assessment Faces   Pain Score 4   goes as high as an 8 with debridement.   Pain Type Acute pain   Pain Location Leg   Pain Orientation Anterior   Pain Descriptors / Indicators Burning   Pain Onset Other (Comment)  with debridement   Patients Stated Pain Goal 0   Evaluation and Treatment Procedures Explained to Patient/Family Yes   Evaluation and Treatment Procedures agreed to   Wound Properties Date First Assessed: 10/28/16 Time First Assessed: 1127 Wound Type: Laceration Location: Leg Location Orientation: Left;Anterior Wound Description (Comments): on lower shin Present on Admission: Yes   Dressing Type Gauze (Comment)   Dressing Changed Changed   Dressing Status Old drainage   Dressing Change Frequency PRN   Site / Wound Assessment Dusky;Brown   % Wound base Red or Granulating 0%   % Wound base Yellow/Fibrinous Exudate 100%   Peri-wound Assessment Intact;Erythema (blanchable)   Wound Length (cm) 4.5 cm   Wound Width (cm) 3.5 cm   Wound Surface Area (cm^2) 15.75 cm^2   Drainage Amount Scant   Drainage Description Sanguineous   Treatment Cleansed;Debridement (Selective)   Selective Debridement - Location entire wound bed   Selective Debridement - Tools Used Forceps   Selective Debridement - Tissue Removed slough and biofilm    Wound Therapy - Clinical Statement Ms. Schey is an 83 yo female with hx of cellulitis from non healing wounds.  She injured herself over a week ago and the wound is not healing, and in fact, is worsening.  She sought medical attention who referred her to skilled physical therapy for wound care.  Ms. Stivers will benefit from skilled physical therapy to promote a healing environment to allow her wound to heal to prevent cellulitis.    Wound Therapy - Functional Problem List pain with walking    Factors Delaying/Impairing Wound Healing  Multiple medical problems;Vascular compromise   Hydrotherapy Plan Debridement;Dressing change;Patient/family education   Wound Therapy - Frequency Other (comment)  2x a week for 4 weeks    Wound Therapy - Current Recommendations PT   Wound Plan cleansing, moisturizing, debridement and dressing change   Dressing  medihoney followed by application of profore lite.             Objective measurements completed on examination: See above findings.                PT Education - 10/28/16 1211    Education provided Yes   Education Details keep dressing dry, if dressing becomes painful take one layer  off at a time.    Person(s) Educated Child(ren)   Methods Explanation   Comprehension Verbalized understanding          PT Short Term Goals - November 13, 2016 1213      PT SHORT TERM GOAL #1   Title PT wound to be 80% granulated to reduce risk of cellulitis    Time 2   Period Weeks   Status New   Target Date 11/11/16     PT SHORT TERM GOAL #2   Title PT and daughter to be able to verbalize the risk of cellulitis; the signs and symptoms and the need to seek medical attention    Time 2   Period Weeks           PT Long Term Goals - 11-13-2016 1215      PT LONG TERM GOAL #1   Title Pt wound to be 100% granulated to reduce risk for cellulitis    Time 4   Period Weeks   Status New   Target Date 11/25/16     PT LONG TERM GOAL #2   Title Pt wound to be decreased in size to no greater than 2 x 2 cm to allow daughter to feel comfortable is self care for discharge.    Time 4   Period Weeks   Status New     PT LONG TERM GOAL #3   Title PT Pain level with Left leg to be no greater than a 3/10 to allow pt to be able to walk for 5 minutes without increased pain.    Time 4   Period Weeks   Status New              Plan - November 13, 2016 1213    Clinical Impression Statement see above    Clinical Presentation Stable   Clinical Decision Making Low      Patient will  benefit from skilled therapeutic intervention in order to improve the following deficits and impairments:  Pain, Other (comment) (non-healing wound )  Visit Diagnosis: Pain in left lower leg  Unspecified open wound, left lower leg, sequela      G-Codes - Nov 13, 2016 1247    Functional Assessment Tool Used (Outpatient Only) clinical judgement:  size of wound/% of granulation.    Functional Limitation Other PT primary   Mobility: Walking and Moving Around Current Status [phone]) At least 60 percent but less than 80 percent impaired, limited or restricted   Mobility: Walking and Moving Around Goal Status [phone]) At least 1 percent but less than 20 percent impaired, limited or restricted      Problem List Patient Active Problem List   Diagnosis Date Noted  . History of stroke 10/20/2016  . TIA (transient ischemic attack) 10/12/2016  . Acute urinary retention 10/12/2016  . Popliteal artery aneurysm ([place]) 02/05/2016  . Malnutrition of moderate degree 12/15/2015  . DVT of popliteal vein ([place]) 12/14/2015  . Aneurysm of left internal iliac artery ([place]) 12/14/2015  . Sepsis affecting skin ([place]) 12/13/2015  . Dementia 12/13/2015  . GERD (gastroesophageal reflux disease) 12/13/2015  . Severe protein-calorie malnutrition ([place]) 12/13/2015  . Cellulitis of right leg 11/24/2015  . Dyslipidemia 11/24/2015  . Erythema of lower extremity 11/24/2015  . Anemia 11/01/2015  . Esophageal reflux 08/17/2013  . COPD (chronic obstructive pulmonary disease) ([place]) 08/11/2013  . NSTEMI (non-ST elevated myocardial infarction) type 2 07/26/2013  . Dehydration 07/25/2013  . AKI (acute kidney injury) ([place]) 07/25/2013  . Colitis 07/08/2013  .  Hydronephrosis, left 07/08/2013  . Microscopic colitis 06/27/2013  . Memory loss 04/25/2013  . Depression 04/25/2013  . SAH (subarachnoid hemorrhage) ([place]) 11/22/2012  . Seizure disorder secondary to prior [facility] 11/22/2012   Rayetta Humphrey, PT  CLT [phone] 10/28/2016, 12:49 PM  [place] [address] [place], [place], [address] Phone: [phone]   Fax:  [phone]  Name: ROSS HEFFERAN MRN: [id number] Date of Birth: 05-21-1928

## 2016-10-30 ENCOUNTER — Ambulatory Visit (HOSPITAL_COMMUNITY): Payer: Medicare Other

## 2016-10-30 ENCOUNTER — Encounter (HOSPITAL_COMMUNITY): Payer: Self-pay

## 2016-10-30 DIAGNOSIS — M79662 Pain in left lower leg: Secondary | ICD-10-CM

## 2016-10-30 DIAGNOSIS — J449 Chronic obstructive pulmonary disease, unspecified: Secondary | ICD-10-CM | POA: Diagnosis not present

## 2016-10-30 DIAGNOSIS — S81802S Unspecified open wound, left lower leg, sequela: Secondary | ICD-10-CM

## 2016-10-30 DIAGNOSIS — K219 Gastro-esophageal reflux disease without esophagitis: Secondary | ICD-10-CM | POA: Diagnosis not present

## 2016-10-30 DIAGNOSIS — E785 Hyperlipidemia, unspecified: Secondary | ICD-10-CM | POA: Diagnosis not present

## 2016-10-30 DIAGNOSIS — Z8673 Personal history of transient ischemic attack (TIA), and cerebral infarction without residual deficits: Secondary | ICD-10-CM | POA: Diagnosis not present

## 2016-10-30 NOTE — Therapy (Signed)
[place], [place], [address] Phone: [phone]   Fax:  [phone]  Wound Care Therapy  Patient Details  Name: Bridget Ball MRN: [id number] Date of Birth: 1928/12/30 No Data Recorded  Encounter Date: 10/30/2016      PT End of Session - 10/30/16 1621    Visit Number 2   Number of Visits 8   Date for PT Re-Evaluation 11/27/16   Authorization Type [place] - Visit Number 2   Authorization - Number of Visits 8   PT Start Time 1520   PT Stop Time 1553   PT Time Calculation (min) 33 min   Activity Tolerance Patient tolerated treatment well;No increased pain   Behavior During Therapy WFL for tasks assessed/performed      Past Medical History:  Diagnosis Date  . Anxiety   . Arthritis   . Attention to nephrostomy ([place])    PT HAS NEPHROSTOMY TUBE IN PLACE  . Bruises easily   . Cancer [facility])    Skin cancer  . Chronic diarrhea   . Colitis   . COPD (chronic obstructive pulmonary disease) ([place])   . Dementia   . Depression   . GERD (gastroesophageal reflux disease)   . Hiatal hernia   . Hx of bronchitis   . Hx of pulmonary edema JULY 2015  . Hx of pulmonary edema JULY 2015  . Hx of septic shock JULY 2015  . Hydronephrosis, left   . Hyperlipidemia   . Memory difficulties   . Microscopic colitis   . Myocardial infarction [facility])    unknown time   . Renal insufficiency   . Seizures ([place])    last seizure was 4-5 years ago with "brain bleed"  . Shortness of breath    with exertion  . Stroke [facility])    TIA four years ago / stroke NOV 2014    Past Surgical History:  Procedure Laterality Date  . APPENDECTOMY    . BREAST BIOPSY Right 02/02/2014   Procedure: RIGHT BREAST BIOPSY;  Surgeon: Jamesetta So, MD;  Location: [facility] ORS;  Service: General;  Laterality: Right;  . COLONOSCOPY N/A 05/24/2013   Procedure: COLONOSCOPY;  Surgeon: Rogene Houston, MD;  Location[facility];  Service:  Endoscopy;  Laterality: N/A;  100  . CYSTOSCOPY W/ URETERAL STENT PLACEMENT Left 08/06/2015   Procedure: CYSTOSCOPY WITH LEFT URETERAL STENT EXCHANGE - Sammie Bench;  Surgeon: Franchot Gallo, MD;  Location: [facility];  Service: Urology;  Laterality: Left;  . CYSTOSCOPY W/ URETERAL STENT PLACEMENT Left 07/07/2016   Procedure: CYSTOSCOPY, LEFT RETROGRADE PYELOGRAM WITH LEFT URETERAL STENT REPLACEMENT;  Surgeon: Franchot Gallo, MD;  Location: [facility];  Service: Urology;  Laterality: Left;  . CYSTOSCOPY WITH RETROGRADE PYELOGRAM, URETEROSCOPY AND STENT PLACEMENT Left 09/18/2013   Procedure: CYSTOSCOPY WITH RETROGRADE PYELOGRAM,  URETEROSCOPY AND  STENT PLACEMENT, removal of nephrostomy tube;  Surgeon: Jorja Loa, MD;  [facility];  Service: Urology;  Laterality: Left;  . CYSTOSCOPY WITH STENT PLACEMENT Left 09/04/2014   Procedure: CYSTOSCOPY WITH LEFT J2 STENT EXCHANGE;  Surgeon: Franchot Gallo, MD;  Location: [facility];  Service: Urology;  Laterality: Left;  . ENDOVASCULAR REPAIR OF POPLITEAL ARTERY ANEURYSM  02/05/2016  . ESOPHAGOGASTRODUODENOSCOPY N/A 05/24/2013   Procedure: ESOPHAGOGASTRODUODENOSCOPY (EGD);  Surgeon: Rogene Houston, MD;  [facility];  Service: Endoscopy;  Laterality: N/A;  . FALSE ANEURYSM REPAIR Right 02/05/2016   Procedure: REPAIR RIGHT POLITEAL  ANEURYSM;  Surgeon: Serafina Mitchell, MD;  Location: [place];  Service: Vascular;  Laterality: Right;  . KNEE ARTHROSCOPY Right   . SKIN CANCER EXCISION    . TONSILLECTOMY    . TUBAL LIGATION      There were no vitals filed for this visit.       Subjective Assessment - 10/30/16 1553    Subjective Pt arrived with daughter with dressings intact, no reports of pain but does reports she is nervous about debridement.   Patient is accompained by: Family member                   Wound Therapy - 10/30/16 1554    Subjective Pt arrived with daughter with dressings intact, no reports of pain but does reports  she is nervous about debridement.   Patient and Family Stated Goals wound to heal   Date of Onset 11/19/16   Prior Treatments self care    Pain Assessment No/denies pain   Evaluation and Treatment Procedures Explained to Patient/Family Yes   Evaluation and Treatment Procedures agreed to   Wound Properties Date First Assessed: 10/28/16 Time First Assessed: 1127 Wound Type: Laceration Location: Leg Location Orientation: Left;Anterior Wound Description (Comments): on lower shin Present on Admission: Yes   Dressing Type Impregnated gauze (petrolatum);Compression wrap  lotion, xeroform, profore lite with extra cotton for cone    Dressing Changed Changed   Dressing Status Old drainage   Dressing Change Frequency PRN   Site / Wound Assessment Dusky;Brown   % Wound base Red or Granulating 100%   % Wound base Yellow/Fibrinous Exudate 0%   Peri-wound Assessment Intact;Erythema (blanchable);Maceration  maceration on left side of wound   Wound Length (cm) 4.5 cm   Wound Width (cm) --  superior at 4cm; inferior at 2.5   Wound Depth (cm) 0 cm   Drainage Amount Scant   Drainage Description Sanguineous   Treatment Cleansed;Debridement (Selective)   Selective Debridement - Location entire wound bed   Selective Debridement - Tools Used Forceps;Scalpel   Selective Debridement - Tissue Removed slough, biofilm and dry blood on wound bed   Wound Therapy - Clinical Statement Able to fully debridement all slough, biofilm and dried blood with 100% granulation following.  Changed dressing to xeroform with profore lite and netting.  No reoprts of pain through session, was nervous/anxious through session.     Wound Therapy - Functional Problem List pain with walking    Factors Delaying/Impairing Wound Healing Multiple medical problems;Vascular compromise   Hydrotherapy Plan Debridement;Dressing change;Patient/family education   Wound Therapy - Frequency --  2x/week for 4 weeks   Wound Therapy - Current  Recommendations PT   Wound Plan cleansing, moisturizing, debridement and dressing change   Dressing  xeroform, lotion, profore lite with addional cotton for cone shape                   PT Short Term Goals - 10/28/16 1213      PT SHORT TERM GOAL #1   Title PT wound to be 80% granulated to reduce risk of cellulitis    Time 2   Period Weeks   Status New   Target Date 11/11/16     PT SHORT TERM GOAL #2   Title PT and daughter to be able to verbalize the risk of cellulitis; the signs and symptoms and the need to seek medical attention    Time 2   Period Weeks  PT Long Term Goals - 10/28/16 1215      PT LONG TERM GOAL #1   Title Pt wound to be 100% granulated to reduce risk for cellulitis    Time 4   Period Weeks   Status New   Target Date 11/25/16     PT LONG TERM GOAL #2   Title Pt wound to be decreased in size to no greater than 2 x 2 cm to allow daughter to feel comfortable is self care for discharge.    Time 4   Period Weeks   Status New     PT LONG TERM GOAL #3   Title PT Pain level with Left leg to be no greater than a 3/10 to allow pt to be able to walk for 5 minutes without increased pain.    Time 4   Period Weeks   Status New             Patient will benefit from skilled therapeutic intervention in order to improve the following deficits and impairments:     Visit Diagnosis: Pain in left lower leg  Unspecified open wound, left lower leg, sequela     Problem List Patient Active Problem List   Diagnosis Date Noted  . History of stroke 10/20/2016  . TIA (transient ischemic attack) 10/12/2016  . Acute urinary retention 10/12/2016  . Popliteal artery aneurysm ([place]) 02/05/2016  . Malnutrition of moderate degree 12/15/2015  . DVT of popliteal vein ([place]) 12/14/2015  . Aneurysm of left internal iliac artery ([place]) 12/14/2015  . Sepsis affecting skin ([place]) 12/13/2015  . Dementia 12/13/2015  . GERD (gastroesophageal reflux  disease) 12/13/2015  . Severe protein-calorie malnutrition ([place]) 12/13/2015  . Cellulitis of right leg 11/24/2015  . Dyslipidemia 11/24/2015  . Erythema of lower extremity 11/24/2015  . Anemia 11/01/2015  . Esophageal reflux 08/17/2013  . COPD (chronic obstructive pulmonary disease) ([place]) 08/11/2013  . NSTEMI (non-ST elevated myocardial infarction) type 2 07/26/2013  . Dehydration 07/25/2013  . AKI (acute kidney injury) ([place]) 07/25/2013  . Colitis 07/08/2013  . Hydronephrosis, left 07/08/2013  . Microscopic colitis 06/27/2013  . Memory loss 04/25/2013  . Depression 04/25/2013  . SAH (subarachnoid hemorrhage) ([place]) 11/22/2012  . Seizure disorder secondary to prior [facility] 11/22/2012   Ihor [place], [place]; [place]  Aldona Lento 10/30/2016, 4:24 PM  [place] [address] [place], [place], [address] Phone: [phone]   Fax:  [phone]  Name: Bridget Ball MRN: [id number] Date of Birth: Oct 08, 1928

## 2016-11-02 ENCOUNTER — Ambulatory Visit (INDEPENDENT_AMBULATORY_CARE_PROVIDER_SITE_OTHER): Payer: Medicare Other | Admitting: Surgery

## 2016-11-02 ENCOUNTER — Encounter: Payer: Self-pay | Admitting: Surgery

## 2016-11-02 ENCOUNTER — Ambulatory Visit (HOSPITAL_COMMUNITY)
Admission: RE | Admit: 2016-11-02 | Discharge: 2016-11-02 | Disposition: A | Discharge status: Home / Self Care | Payer: Medicare Other | Source: Ambulatory Visit | Attending: Surgery | Admitting: Surgery

## 2016-11-02 ENCOUNTER — Ambulatory Visit (INDEPENDENT_AMBULATORY_CARE_PROVIDER_SITE_OTHER)
Admission: RE | Admit: 2016-11-02 | Discharge: 2016-11-02 | Disposition: A | Discharge status: Home / Self Care | Payer: Medicare Other | Source: Ambulatory Visit | Attending: Surgery | Admitting: Surgery

## 2016-11-02 VITALS — BP 119/72 | HR 104 | Temp 97.2°F | Resp 14 | Ht 61.0 in | Wt 98.0 lb

## 2016-11-02 DIAGNOSIS — I724 Aneurysm of artery of lower extremity: Secondary | ICD-10-CM | POA: Diagnosis not present

## 2016-11-02 LAB — VAS US LOWER EXTREMITY ARTERIAL DUPLEX
RATIBDISTSYS: 27 cm/s
RIGHT POST TIB DIST SYS: 119 cm/s
RSFPPSV: -36 cm/s
Right peroneal sys PSV: 18 cm/s
Right super femoral dist sys PSV: -282 cm/s
Right super femoral mid sys PSV: -50 cm/s

## 2016-11-02 NOTE — Progress Notes (Signed)
Vascular and Vein Specialist of [place]  Patient name: Bridget Ball MRN: [id number] DOB: 17-Mar-1928 Sex: female   REASON FOR VISIT:    Follow up  [place]:    Bridget Meneely Knightis a 83 y.o.femalein the hospital where there was concern for possible necrotizing fasciitis. 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 I felt this was the source of her newly diagnosed DVT and subsequent cellulitis. She got better with IV heparin and IV antibiotics and was ultimately discharged home on Xaralto. On 02/05/2016 she underwent primary repair of her right popliteal aneurysm. I was able to do this by resecting the aneurysm and mobilizing the edges to perform a primary end to end anastomosis.    Since her last visit, she has fallen and has a wound on her left leg which is being treated at the wound center.  She denies symptoms of claudication.  PAST MEDICAL HISTORY:   Past Medical History:  Diagnosis Date  . Anxiety   . Arthritis   . Attention to nephrostomy ([place])    PT HAS NEPHROSTOMY TUBE IN PLACE  . Bruises easily   . Cancer [facility])    Skin cancer  . Chronic diarrhea   . Colitis   . COPD (chronic obstructive pulmonary disease) ([place])   . Dementia   . Depression   . GERD (gastroesophageal reflux disease)   . Hiatal hernia   . Hx of bronchitis   . Hx of pulmonary edema JULY 2015  . Hx of pulmonary edema JULY 2015  . Hx of septic shock JULY 2015  . Hydronephrosis, left   . Hyperlipidemia   . Memory difficulties   . Microscopic colitis   . Myocardial infarction [facility])    unknown time   . Renal insufficiency   . Seizures ([place])    last seizure was 4-5 years ago with "brain bleed"  . Shortness of breath    with exertion  . Stroke [facility])    TIA four years ago / stroke NOV 2014     FAMILY HISTORY:   Family History  Problem  Relation Age of Onset  . Diabetes Mother   . Stroke Mother   . Heart attack Father     SOCIAL HISTORY:   Social History  Substance Use Topics  . Smoking status: Former Smoker    Packs/day: 1.00    Years: 50.00    Types: Cigarettes    Quit date: 11/13/1997  . Smokeless tobacco: Never Used  . Alcohol use 4.2 oz/week    7 Glasses of wine per week     Comment: 2 glasses a night wine (none in a year)     ALLERGIES:   Allergies  Allergen Reactions  . Penicillins Anaphylaxis    Throat swelling as a child. Tolerated ceftriaxone 07/29/13.      CURRENT MEDICATIONS:   Current Outpatient Prescriptions  Medication Sig Dispense Refill  . albuterol (PROVENTIL HFA;VENTOLIN HFA) 108 (90 BASE) MCG/ACT inhaler Inhale 2 puffs into the lungs every 6 (six) hours as needed for wheezing or shortness of breath. 1 Inhaler 0  . aspirin 325 MG tablet Take 325 mg by mouth daily.    Marland Kitchen atorvastatin (LIPITOR) 40 MG tablet Take 1 tablet (40 mg total) by mouth at bedtime. 30 tablet 3  . budesonide-formoterol (SYMBICORT) 160-4.5 MCG/ACT inhaler Inhale 2 puffs into the lungs 2 (two) times daily. 1 Inhaler  0  . citalopram (CELEXA) 10 MG tablet Take 1 tablet (10 mg total) by mouth daily. 90 tablet 2  . docusate sodium (COLACE) 100 MG capsule Take 200 mg by mouth daily.     . feeding supplement, ENSURE ENLIVE, (ENSURE ENLIVE) LIQD Take 237 mLs by mouth 2 (two) times daily between meals. 237 mL 12  . Ferrous Sulfate 27 MG TABS Take 27 mg by mouth daily.    Marland Kitchen levETIRAcetam (KEPPRA) 250 MG tablet Take 1 tablet (250 mg total) by mouth 2 (two) times daily. 60 tablet 5  . memantine (NAMENDA XR) 28 MG CP24 24 hr capsule Take 1 capsule (28 mg total) by mouth daily. 90 capsule 3  . Methylfol-Algae-B12-Acetylcyst (CEREFOLIN NAC) 6-90.314-2-600 MG TABS Take 1 tablet by mouth daily. 90 tablet 3  . naproxen sodium (ANAPROX) 220 MG tablet Take 440 mg by mouth daily as needed (HA, back pain).     . pantoprazole (PROTONIX) 40  MG tablet Take 1 tablet (40 mg total) by mouth every other day. 45 tablet 1  . Potassium 99 MG TABS Take 99 mg by mouth daily.    Marland Kitchen SPIRIVA HANDIHALER 18 MCG inhalation capsule Place 1 capsule into inhaler and inhale daily.  1  . tamsulosin (FLOMAX) 0.4 MG CAPS capsule Take 1 capsule (0.4 mg total) by mouth daily. 30 capsule 2   No current facility-administered medications for this visit.     REVIEW OF SYSTEMS:   [X]  denotes positive finding, [ ]  denotes negative finding Cardiac  Comments:  Chest pain or chest pressure:    Shortness of breath upon exertion:    Short of breath when lying flat:    Irregular heart rhythm:        Vascular    Pain in calf, thigh, or hip brought on by ambulation:    Pain in feet at night that wakes you up from your sleep:     Blood clot in your veins:    Leg swelling:         Pulmonary    Oxygen at home:    Productive cough:     Wheezing:         Neurologic    Sudden weakness in arms or legs:     Sudden numbness in arms or legs:     Sudden onset of difficulty speaking or slurred speech:    Temporary loss of vision in one eye:     Problems with dizziness:         Gastrointestinal    Blood in stool:     Vomited blood:         Genitourinary    Burning when urinating:     Blood in urine:        Psychiatric    Major depression:         Hematologic    Bleeding problems:    Problems with blood clotting too easily:        Skin    Rashes or ulcers:        Constitutional    Fever or chills:      PHYSICAL EXAM:   Vitals:   11/02/16 1540  BP: 119/72  Pulse: (!) 104  Resp: 14  Temp: (!) 97.2 F (36.2 C)  SpO2: 96%  Weight: 98 lb (44.5 kg)  Height: 5\' 1"  (1.549 m)    GENERAL: The patient is a well-nourished female, in no acute distress. The vital signs are documented above. CARDIAC: There is  a regular rate and rhythm.  VASCULAR: Palpable right dorsalis pedis pulse.  No significant edema. PULMONARY: Non-labored  respirations MUSCULOSKELETAL: There are no major deformities or cyanosis. NEUROLOGIC: No focal weakness or paresthesias are detected. SKIN: Dressing over the left leg PSYCHIATRIC: The patient has a normal affect.  STUDIES:   I have reviewed her duplex today.  There has been a increase in the velocity in the distal superficial femoral artery suggesting a 50-74 percent stenosis.  Velocity is 282.  MEDICAL ISSUES:   I discussed the above ultrasound findings with the patient.  I think it is important to continue to monitor the stenosis.  I discussed what to do should she develop a sudden change in her symptoms.  I have her scheduled for follow-up in 6 months with a repeat duplex  The patient is now off anticoagulation and continues to take an aspirin.     [name], MD V[facility] and Vein Specialists of [facility] [phone] Pager [phone]

## 2016-11-04 ENCOUNTER — Ambulatory Visit (HOSPITAL_COMMUNITY): Payer: Medicare Other | Admitting: Physical Therapy

## 2016-11-04 DIAGNOSIS — K219 Gastro-esophageal reflux disease without esophagitis: Secondary | ICD-10-CM | POA: Diagnosis not present

## 2016-11-04 DIAGNOSIS — M79662 Pain in left lower leg: Secondary | ICD-10-CM

## 2016-11-04 DIAGNOSIS — E785 Hyperlipidemia, unspecified: Secondary | ICD-10-CM | POA: Diagnosis not present

## 2016-11-04 DIAGNOSIS — Z8673 Personal history of transient ischemic attack (TIA), and cerebral infarction without residual deficits: Secondary | ICD-10-CM | POA: Diagnosis not present

## 2016-11-04 DIAGNOSIS — J449 Chronic obstructive pulmonary disease, unspecified: Secondary | ICD-10-CM | POA: Diagnosis not present

## 2016-11-04 DIAGNOSIS — S81802S Unspecified open wound, left lower leg, sequela: Secondary | ICD-10-CM | POA: Diagnosis not present

## 2016-11-04 NOTE — Therapy (Signed)
Bridget Ball, [place], [address] Phone: [phone]   Fax:  [phone]  Wound Care Therapy  Patient Details  Name: Bridget Ball MRN: [id number] Date of Birth: 1928/01/24 No Data Recorded  Encounter Date: 11/04/2016      PT End of Session - 11/04/16 1733    Visit Number 3   Number of Visits 8   Date for PT Re-Evaluation 11/27/16   Authorization Type [place] - Visit Number 3   Authorization - Number of Visits 8   PT Start Time 5625  pt was late   PT Stop Time 1725   PT Time Calculation (min) 30 min   Activity Tolerance Patient tolerated treatment well;No increased pain   Behavior During Therapy WFL for tasks assessed/performed      Past Medical History:  Diagnosis Date  . Anxiety   . Arthritis   . Attention to nephrostomy ([place])    PT HAS NEPHROSTOMY TUBE IN PLACE  . Bruises easily   . Cancer [facility])    Skin cancer  . Chronic diarrhea   . Colitis   . COPD (chronic obstructive pulmonary disease) ([place])   . Dementia   . Depression   . GERD (gastroesophageal reflux disease)   . Hiatal hernia   . Hx of bronchitis   . Hx of pulmonary edema JULY 2015  . Hx of pulmonary edema JULY 2015  . Hx of septic shock JULY 2015  . Hydronephrosis, left   . Hyperlipidemia   . Memory difficulties   . Microscopic colitis   . Myocardial infarction [facility])    unknown time   . Renal insufficiency   . Seizures ([place])    last seizure was 4-5 years ago with "brain bleed"  . Shortness of breath    with exertion  . Stroke [facility])    TIA four years ago / stroke NOV 2014    Past Surgical History:  Procedure Laterality Date  . APPENDECTOMY    . BREAST BIOPSY Right 02/02/2014   Procedure: RIGHT BREAST BIOPSY;  Surgeon: Jamesetta So, MD;  Location: [facility] ORS;  Service: General;  Laterality: Right;  . COLONOSCOPY N/A 05/24/2013   Procedure: COLONOSCOPY;  Surgeon: Rogene Houston, MD;  Location: [facility] ENDO  SUITE;  Service: Endoscopy;  Laterality: N/A;  100  . CYSTOSCOPY W/ URETERAL STENT PLACEMENT Left 08/06/2015   Procedure: CYSTOSCOPY WITH LEFT URETERAL STENT EXCHANGE - Sammie Bench;  Surgeon: Franchot Gallo, MD;  Location: [facility] ORS;  Service: Urology;  Laterality: Left;  . CYSTOSCOPY W/ URETERAL STENT PLACEMENT Left 07/07/2016   Procedure: CYSTOSCOPY, LEFT RETROGRADE PYELOGRAM WITH LEFT URETERAL STENT REPLACEMENT;  Surgeon: Franchot Gallo, MD;  Location: [facility] ORS;  Service: Urology;  Laterality: Left;  . CYSTOSCOPY WITH RETROGRADE PYELOGRAM, URETEROSCOPY AND STENT PLACEMENT Left 09/18/2013   Procedure: CYSTOSCOPY WITH RETROGRADE PYELOGRAM,  URETEROSCOPY AND  STENT PLACEMENT, removal of nephrostomy tube;  Surgeon: Jorja Loa, MD;  Location: [facility] ORS;  Service: Urology;  Laterality: Left;  . CYSTOSCOPY WITH STENT PLACEMENT Left 09/04/2014   Procedure: CYSTOSCOPY WITH LEFT J2 STENT EXCHANGE;  Surgeon: Franchot Gallo, MD;  Location: [facility] ORS;  Service: Urology;  Laterality: Left;  . ENDOVASCULAR REPAIR OF POPLITEAL ARTERY ANEURYSM  02/05/2016  . ESOPHAGOGASTRODUODENOSCOPY N/A 05/24/2013   Procedure: ESOPHAGOGASTRODUODENOSCOPY (EGD);  Surgeon: Rogene Houston, MD;  Location: [facility] ENDO SUITE;  Service: Endoscopy;  Laterality: N/A;  . FALSE ANEURYSM REPAIR Right 02/05/2016  Procedure: REPAIR RIGHT POLITEAL  ANEURYSM;  Surgeon: Serafina Mitchell, MD;  Location: [place];  Service: Vascular;  Laterality: Right;  . KNEE ARTHROSCOPY Right   . SKIN CANCER EXCISION    . TONSILLECTOMY    . TUBAL LIGATION      There were no vitals filed for this visit.       Subjective Assessment - 11/04/16 1731    Subjective Pt states her leg is feeling fine today. Dressings intact.                    Wound Therapy - 11/04/16 1731    Subjective Pt arrived with daughter.  Dressings intact without pain.   Patient and Family Stated Goals wound to heal   Date of Onset 11/19/16   Prior Treatments self care     Pain Assessment No/denies pain   Evaluation and Treatment Procedures Explained to Patient/Family Yes   Evaluation and Treatment Procedures agreed to   Wound Properties Date First Assessed: 10/28/16 Time First Assessed: 1127 Wound Type: Laceration Location: Leg Location Orientation: Left;Anterior Wound Description (Comments): on lower shin Present on Admission: Yes   Dressing Type Impregnated gauze (petrolatum);Compression wrap  lotion, xeroform, profore lite with extra cotton for cone    Dressing Changed Changed   Dressing Status Old drainage   Dressing Change Frequency PRN   Site / Wound Assessment Dusky;Brown   % Wound base Red or Granulating 100%   % Wound base Yellow/Fibrinous Exudate 0%   Peri-wound Assessment Intact;Erythema (blanchable);Maceration  maceration on left side of wound   Drainage Amount Scant   Drainage Description Sanguineous   Treatment Cleansed;Debridement (Selective)   Selective Debridement - Location entire wound bed   Selective Debridement - Tools Used Forceps;Scalpel   Selective Debridement - Tissue Removed slough, biofilm and dry blood on wound bed   Wound Therapy - Clinical Statement wound only with slough in distal portion and easiliy debrided with forcepts.  Pt tolerated debridement well.  Moisturized LE well prior to rebandaging with profore lite.  Xeroform used to maintain moisture in wound bed.    Wound Therapy - Functional Problem List pain with walking    Factors Delaying/Impairing Wound Healing Multiple medical problems;Vascular compromise   Hydrotherapy Plan Debridement;Dressing change;Patient/family education   Wound Therapy - Frequency --  2x/week for 4 weeks   Wound Therapy - Current Recommendations PT   Wound Plan cleansing, moisturizing, debridement and dressing change   Dressing  xeroform, lotion, profore lite with addional cotton for cone shape                   PT Short Term Goals - 10/28/16 1213      PT SHORT TERM GOAL #1    Title PT wound to be 80% granulated to reduce risk of cellulitis    Time 2   Period Weeks   Status New   Target Date 11/11/16     PT SHORT TERM GOAL #2   Title PT and daughter to be able to verbalize the risk of cellulitis; the signs and symptoms and the need to seek medical attention    Time 2   Period Weeks           PT Long Term Goals - 10/28/16 1215      PT LONG TERM GOAL #1   Title Pt wound to be 100% granulated to reduce risk for cellulitis    Time 4   Period Weeks   Status New  Target Date 11/25/16     PT LONG TERM GOAL #2   Title Pt wound to be decreased in size to no greater than 2 x 2 cm to allow daughter to feel comfortable is self care for discharge.    Time 4   Period Weeks   Status New     PT LONG TERM GOAL #3   Title PT Pain level with Left leg to be no greater than a 3/10 to allow pt to be able to walk for 5 minutes without increased pain.    Time 4   Period Weeks   Status New             Patient will benefit from skilled therapeutic intervention in order to improve the following deficits and impairments:     Visit Diagnosis: Pain in left lower leg  Unspecified open wound, left lower leg, sequela     Problem List Patient Active Problem List   Diagnosis Date Noted  . History of stroke 10/20/2016  . TIA (transient ischemic attack) 10/12/2016  . Acute urinary retention 10/12/2016  . Popliteal artery aneurysm ([place]) 02/05/2016  . Malnutrition of moderate degree 12/15/2015  . DVT of popliteal vein ([place]) 12/14/2015  . Aneurysm of left internal iliac artery ([place]) 12/14/2015  . Sepsis affecting skin ([place]) 12/13/2015  . Dementia 12/13/2015  . GERD (gastroesophageal reflux disease) 12/13/2015  . Severe protein-calorie malnutrition ([place]) 12/13/2015  . Cellulitis of right leg 11/24/2015  . Dyslipidemia 11/24/2015  . Erythema of lower extremity 11/24/2015  . Anemia 11/01/2015  . Esophageal reflux 08/17/2013  . COPD (chronic obstructive  pulmonary disease) ([place]) 08/11/2013  . NSTEMI (non-ST elevated myocardial infarction) type 2 07/26/2013  . Dehydration 07/25/2013  . AKI (acute kidney injury) ([place]) 07/25/2013  . Colitis 07/08/2013  . Hydronephrosis, left 07/08/2013  . Microscopic colitis 06/27/2013  . Memory loss 04/25/2013  . Depression 04/25/2013  . SAH (subarachnoid hemorrhage) ([place]) 11/22/2012  . Seizure disorder secondary to prior [facility] 11/22/2012   Teena Irani, PTA/CLT [phone]  Teena Irani 11/04/2016, 5:34 PM  [place] [address] [place], [place], [address] Phone: [phone]   Fax:  [phone]  Name: KENSY BLIZARD MRN: [id number] Date of Birth: 08-May-1928

## 2016-11-06 ENCOUNTER — Ambulatory Visit (HOSPITAL_COMMUNITY): Payer: Medicare Other | Attending: Internal Medicine

## 2016-11-06 ENCOUNTER — Encounter (HOSPITAL_COMMUNITY): Payer: Self-pay

## 2016-11-06 DIAGNOSIS — X58XXXS Exposure to other specified factors, sequela: Secondary | ICD-10-CM | POA: Insufficient documentation

## 2016-11-06 DIAGNOSIS — S81802S Unspecified open wound, left lower leg, sequela: Secondary | ICD-10-CM

## 2016-11-06 DIAGNOSIS — M79662 Pain in left lower leg: Secondary | ICD-10-CM

## 2016-11-06 NOTE — Therapy (Signed)
[place], [place], [address] Phone: [phone]   Fax:  [phone]  Wound Care Therapy  Patient Details  Name: Bridget Ball MRN: [id number] Date of Birth: 02/14/1928 No Data Recorded  Encounter Date: 11/06/2016      PT End of Session - 11/06/16 1512    Visit Number 4   Number of Visits 8   Date for PT Re-Evaluation 11/27/16   Authorization Type [place] - Visit Number 4   Authorization - Number of Visits 8   PT Start Time 0160   PT Stop Time 1508   PT Time Calculation (min) 33 min   Activity Tolerance Patient tolerated treatment well;No increased pain   Behavior During Therapy WFL for tasks assessed/performed      Past Medical History:  Diagnosis Date  . Anxiety   . Arthritis   . Attention to nephrostomy ([place])    PT HAS NEPHROSTOMY TUBE IN PLACE  . Bruises easily   . Cancer [facility])    Skin cancer  . Chronic diarrhea   . Colitis   . COPD (chronic obstructive pulmonary disease) ([place])   . Dementia   . Depression   . GERD (gastroesophageal reflux disease)   . Hiatal hernia   . Hx of bronchitis   . Hx of pulmonary edema JULY 2015  . Hx of pulmonary edema JULY 2015  . Hx of septic shock JULY 2015  . Hydronephrosis, left   . Hyperlipidemia   . Memory difficulties   . Microscopic colitis   . Myocardial infarction [facility])    unknown time   . Renal insufficiency   . Seizures ([place])    last seizure was 4-5 years ago with "brain bleed"  . Shortness of breath    with exertion  . Stroke [facility])    TIA four years ago / stroke NOV 2014    Past Surgical History:  Procedure Laterality Date  . APPENDECTOMY    . BREAST BIOPSY Right 02/02/2014   Procedure: RIGHT BREAST BIOPSY;  Surgeon: Jamesetta So, MD;  Location: [facility] ORS;  Service: General;  Laterality: Right;  . COLONOSCOPY N/A 05/24/2013   Procedure: COLONOSCOPY;  Surgeon: Rogene Houston, MD;  Location: [facility] ENDO SUITE;  Service:  Endoscopy;  Laterality: N/A;  100  . CYSTOSCOPY W/ URETERAL STENT PLACEMENT Left 08/06/2015   Procedure: CYSTOSCOPY WITH LEFT URETERAL STENT EXCHANGE - Sammie Bench;  Surgeon: Franchot Gallo, MD;  Location: [facility] ORS;  Service: Urology;  Laterality: Left;  . CYSTOSCOPY W/ URETERAL STENT PLACEMENT Left 07/07/2016   Procedure: CYSTOSCOPY, LEFT RETROGRADE PYELOGRAM WITH LEFT URETERAL STENT REPLACEMENT;  Surgeon: Franchot Gallo, MD;  Location: [facility] ORS;  Service: Urology;  Laterality: Left;  . CYSTOSCOPY WITH RETROGRADE PYELOGRAM, URETEROSCOPY AND STENT PLACEMENT Left 09/18/2013   Procedure: CYSTOSCOPY WITH RETROGRADE PYELOGRAM,  URETEROSCOPY AND  STENT PLACEMENT, removal of nephrostomy tube;  Surgeon: Jorja Loa, MD;  Location: [facility];  Service: Urology;  Laterality: Left;  . CYSTOSCOPY WITH STENT PLACEMENT Left 09/04/2014   Procedure: CYSTOSCOPY WITH LEFT J2 STENT EXCHANGE;  Surgeon: Franchot Gallo, MD;  Location: [facility] ORS;  Service: Urology;  Laterality: Left;  . ENDOVASCULAR REPAIR OF POPLITEAL ARTERY ANEURYSM  02/05/2016  . ESOPHAGOGASTRODUODENOSCOPY N/A 05/24/2013   Procedure: ESOPHAGOGASTRODUODENOSCOPY (EGD);  Surgeon: Rogene Houston, MD;  Location: [facility];  Service: Endoscopy;  Laterality: N/A;  . FALSE ANEURYSM REPAIR Right 02/05/2016   Procedure: REPAIR RIGHT POLITEAL  ANEURYSM;  Surgeon: Serafina Mitchell, MD;  Location: [place];  Service: Vascular;  Laterality: Right;  . KNEE ARTHROSCOPY Right   . SKIN CANCER EXCISION    . TONSILLECTOMY    . TUBAL LIGATION      There were no vitals filed for this visit.       Subjective Assessment - 11/06/16 1508    Subjective No reports of pain today.  Dressings intact   Currently in Pain? No/denies                   Wound Therapy - 11/06/16 1508    Subjective No reports of pain today.  Dressings intact   Patient and Family Stated Goals wound to heal   Date of Onset 11/19/16   Prior Treatments self care    Pain Assessment  No/denies pain   Evaluation and Treatment Procedures Explained to Patient/Family Yes   Evaluation and Treatment Procedures agreed to   Wound Properties Date First Assessed: 10/28/16 Time First Assessed: 1127 Wound Type: Laceration Location: Leg Location Orientation: Left;Anterior Wound Description (Comments): on lower shin Present on Admission: Yes   Dressing Type Impregnated gauze (petrolatum);Compression wrap  xeroform, 4x4, lotion and profore lite with netting   Dressing Changed Changed   Dressing Status Old drainage   Dressing Change Frequency PRN   Site / Wound Assessment Dusky;Brown   % Wound base Red or Granulating 100%   % Wound base Yellow/Fibrinous Exudate 0%   Peri-wound Assessment Intact;Erythema (blanchable);Maceration   Wound Length (cm) 4.5 cm   Wound Width (cm) 3.3 cm   Wound Depth (cm) 0 cm   Wound Volume (cm^3) 0 cm^3   Wound Surface Area (cm^2) 14.85 cm^2   Drainage Amount Scant   Drainage Description Sanguineous   Treatment Cleansed;Debridement (Selective)   Selective Debridement - Location entire wound bed   Selective Debridement - Tools Used Forceps;Scalpel   Selective Debridement - Tissue Removed biofilm on wound bed   Wound Therapy - Clinical Statement Biofilm only on wound bed.  Wound continues to improve in approximation of granulation tissues.  Continues with xeroform with profore lite compression.  No reports of pain through session.  .   Wound Therapy - Functional Problem List pain with walking    Factors Delaying/Impairing Wound Healing Multiple medical problems;Vascular compromise   Hydrotherapy Plan Debridement;Dressing change;Patient/family education   Wound Therapy - Frequency --  2x/week for 4 weeks   Wound Therapy - Current Recommendations PT   Wound Plan cleansing, moisturizing, debridement and dressing change   Dressing  xeroform, lotion, profore lite with addional cotton for cone shape                   PT Short Term Goals -  10/28/16 1213      PT SHORT TERM GOAL #1   Title PT wound to be 80% granulated to reduce risk of cellulitis    Time 2   Period Weeks   Status New   Target Date 11/11/16     PT SHORT TERM GOAL #2   Title PT and daughter to be able to verbalize the risk of cellulitis; the signs and symptoms and the need to seek medical attention    Time 2   Period Weeks           PT Long Term Goals - 10/28/16 1215      PT LONG TERM GOAL #1   Title Pt wound to be 100% granulated to reduce risk  for cellulitis    Time 4   Period Weeks   Status New   Target Date 11/25/16     PT LONG TERM GOAL #2   Title Pt wound to be decreased in size to no greater than 2 x 2 cm to allow daughter to feel comfortable is self care for discharge.    Time 4   Period Weeks   Status New     PT LONG TERM GOAL #3   Title PT Pain level with Left leg to be no greater than a 3/10 to allow pt to be able to walk for 5 minutes without increased pain.    Time 4   Period Weeks   Status New             Patient will benefit from skilled therapeutic intervention in order to improve the following deficits and impairments:     Visit Diagnosis: Pain in left lower leg  Unspecified open wound, left lower leg, sequela     Problem List Patient Active Problem List   Diagnosis Date Noted  . History of stroke 10/20/2016  . TIA (transient ischemic attack) 10/12/2016  . Acute urinary retention 10/12/2016  . Popliteal artery aneurysm ([place]) 02/05/2016  . Malnutrition of moderate degree 12/15/2015  . DVT of popliteal vein ([place]) 12/14/2015  . Aneurysm of left internal iliac artery ([place]) 12/14/2015  . Sepsis affecting skin ([place]) 12/13/2015  . Dementia 12/13/2015  . GERD (gastroesophageal reflux disease) 12/13/2015  . Severe protein-calorie malnutrition ([place]) 12/13/2015  . Cellulitis of right leg 11/24/2015  . Dyslipidemia 11/24/2015  . Erythema of lower extremity 11/24/2015  . Anemia 11/01/2015  . Esophageal  reflux 08/17/2013  . COPD (chronic obstructive pulmonary disease) ([place]) 08/11/2013  . NSTEMI (non-ST elevated myocardial infarction) type 2 07/26/2013  . Dehydration 07/25/2013  . AKI (acute kidney injury) ([place]) 07/25/2013  . Colitis 07/08/2013  . Hydronephrosis, left 07/08/2013  . Microscopic colitis 06/27/2013  . Memory loss 04/25/2013  . Depression 04/25/2013  . SAH (subarachnoid hemorrhage) ([place]) 11/22/2012  . Seizure disorder secondary to prior [facility] 11/22/2012   Ihor Austin, [place]; [place]  Aldona Lento 11/06/2016, 3:12 PM  [place], [place], [address] Phone: [phone]   Fax:  [phone]  Name: Bridget Ball MRN: [id number] Date of Birth: 1928/04/08

## 2016-11-06 NOTE — Addendum Note (Signed)
Addended by: Lianne Cure A on: 11/06/2016 01:08 PM   Modules accepted: Orders

## 2016-11-09 ENCOUNTER — Encounter (HOSPITAL_COMMUNITY): Payer: Self-pay | Admitting: Physical Therapy

## 2016-11-09 ENCOUNTER — Ambulatory Visit (HOSPITAL_COMMUNITY): Payer: Medicare Other | Admitting: Physical Therapy

## 2016-11-09 DIAGNOSIS — S81802S Unspecified open wound, left lower leg, sequela: Secondary | ICD-10-CM | POA: Diagnosis not present

## 2016-11-09 DIAGNOSIS — M79662 Pain in left lower leg: Secondary | ICD-10-CM

## 2016-11-09 NOTE — Therapy (Signed)
[place], [place], [address] Phone: [phone]   Fax:  [phone]  Wound Care Therapy  Patient Details  Name: Bridget Ball MRN: [id number] Date of Birth: 1928-02-25 No Data Recorded  Encounter Date: 11/09/2016  PT End of Session - 11/09/16 1612    Visit Number  5    Number of Visits  8    Date for PT Re-Evaluation  11/27/16    Authorization Type  UHC medicare    Authorization - Visit Number  5    Authorization - Number of Visits  8    PT Start Time  1520    PT Stop Time  1555    PT Time Calculation (min)  35 min    Activity Tolerance  Patient tolerated treatment well    Behavior During Therapy  [facility] for tasks assessed/performed       Past Medical History:  Diagnosis Date  . Anxiety   . Arthritis   . Attention to nephrostomy ([place])    PT HAS NEPHROSTOMY TUBE IN PLACE  . Bruises easily   . Cancer [facility])    Skin cancer  . Chronic diarrhea   . Colitis   . COPD (chronic obstructive pulmonary disease) ([place])   . Dementia   . Depression   . GERD (gastroesophageal reflux disease)   . Hiatal hernia   . Hx of bronchitis   . Hx of pulmonary edema JULY 2015  . Hx of pulmonary edema JULY 2015  . Hx of septic shock JULY 2015  . Hydronephrosis, left   . Hyperlipidemia   . Memory difficulties   . Microscopic colitis   . Myocardial infarction [facility])    unknown time   . Renal insufficiency   . Seizures ([place])    last seizure was 4-5 years ago with "brain bleed"  . Shortness of breath    with exertion  . Stroke [facility])    TIA four years ago / stroke NOV 2014    Past Surgical History:  Procedure Laterality Date  . APPENDECTOMY    . ENDOVASCULAR REPAIR OF POPLITEAL ARTERY ANEURYSM  02/05/2016  . KNEE ARTHROSCOPY Right   . SKIN CANCER EXCISION    . TONSILLECTOMY    . TUBAL LIGATION      There were no vitals filed for this visit.              Wound Therapy - 11/09/16 1605    Subjective   Patient arrives with her daughter, no major changes, no pain     Patient and Family Stated Goals  wound to heal    Date of Onset  11/19/16    Prior Treatments  self care     Pain Assessment  No/denies pain    Evaluation and Treatment Procedures Explained to Patient/Family  Yes    Evaluation and Treatment Procedures  agreed to    Wound Properties Date First Assessed: 10/28/16 Time First Assessed: 1127 Wound Type: Laceration Location: Leg Location Orientation: Left;Anterior Wound Description (Comments): on lower shin Present on Admission: Yes   Dressing Type  Impregnated gauze (petrolatum);Compression wrap    Dressing Changed  Changed    Dressing Status  Old drainage    Dressing Change Frequency  PRN    Site / Wound Assessment  Red;Pink    % Wound base Red or Granulating  80%    % Wound base Other/Granulation Tissue (Comment)  20% pink    pink  Drainage Amount  Scant    Drainage Description  Serosanguineous    Treatment  Cleansed;Debridement (Selective);Packing (Impregnated strip);Pressure applied;Other (Comment) debrided, xeroform, vasoline, profore lite    debrided, xeroform, vasoline, profore lite    Selective Debridement - Location  lower, more medial wound     Selective Debridement - Tools Used  Forceps    Selective Debridement - Tissue Removed  biofilm on wound bed    Wound Therapy - Clinical Statement   Patient arrives today with her daughter, both in good spirits; wound appears to be healing well upon examination today and is starting to heal, it has now separated into two separate wounds. Debrided biofilm off of LE wound, continued to dress with xeroform and profore lite.    Wound Therapy - Functional Problem List  pain with walking     Factors Delaying/Impairing Wound Healing  Multiple medical problems;Vascular compromise    Hydrotherapy Plan  Debridement;Dressing change    Wound Therapy - Current Recommendations  PT    Wound Plan  cleansing, moisturizing, debridement and  dressing change    Dressing   xeroform, lotion, profore lite with addional cotton for cone shape              PT Education - 11/09/16 1612    Education provided  Yes    Education Details  genearl wound status     Person(s) Educated  Patient;Child(ren)    Methods  Explanation    Comprehension  Verbalized understanding       PT Short Term Goals - 10/28/16 1213      PT SHORT TERM GOAL #1   Title  PT wound to be 80% granulated to reduce risk of cellulitis     Time  2    Period  Weeks    Status  New    Target Date  11/11/16      PT SHORT TERM GOAL #2   Title  PT and daughter to be able to verbalize the risk of cellulitis; the signs and symptoms and the need to seek medical attention     Time  2    Period  Weeks        PT Long Term Goals - 10/28/16 1215      PT LONG TERM GOAL #1   Title  Pt wound to be 100% granulated to reduce risk for cellulitis     Time  4    Period  Weeks    Status  New    Target Date  11/25/16      PT LONG TERM GOAL #2   Title  Pt wound to be decreased in size to no greater than 2 x 2 cm to allow daughter to feel comfortable is self care for discharge.     Time  4    Period  Weeks    Status  New      PT LONG TERM GOAL #3   Title  PT Pain level with Left leg to be no greater than a 3/10 to allow pt to be able to walk for 5 minutes without increased pain.     Time  4    Period  Weeks    Status  New              Patient will benefit from skilled therapeutic intervention in order to improve the following deficits and impairments:     Visit Diagnosis: Pain in left lower leg  Unspecified open wound, left lower leg,  sequela     Problem List Patient Active Problem List   Diagnosis Date Noted  . History of stroke 10/20/2016  . TIA (transient ischemic attack) 10/12/2016  . Acute urinary retention 10/12/2016  . Popliteal artery aneurysm ([place]) 02/05/2016  . Malnutrition of moderate degree 12/15/2015  . DVT of popliteal vein  ([place]) 12/14/2015  . Aneurysm of left internal iliac artery ([place]) 12/14/2015  . Sepsis affecting skin ([place]) 12/13/2015  . Dementia 12/13/2015  . GERD (gastroesophageal reflux disease) 12/13/2015  . Severe protein-calorie malnutrition ([place]) 12/13/2015  . Cellulitis of right leg 11/24/2015  . Dyslipidemia 11/24/2015  . Erythema of lower extremity 11/24/2015  . Anemia 11/01/2015  . Esophageal reflux 08/17/2013  . COPD (chronic obstructive pulmonary disease) ([place]) 08/11/2013  . NSTEMI (non-ST elevated myocardial infarction) type 2 07/26/2013  . Dehydration 07/25/2013  . AKI (acute kidney injury) ([place]) 07/25/2013  . Colitis 07/08/2013  . Hydronephrosis, left 07/08/2013  . Microscopic colitis 06/27/2013  . Memory loss 04/25/2013  . Depression 04/25/2013  . SAH (subarachnoid hemorrhage) ([place]) 11/22/2012  . Seizure disorder secondary to prior [facility] 11/22/2012     Deniece Ree PT, DPT [place] [address] [place], [place], [address] Phone: [phone]   Fax:  [phone]  Name: Bridget Ball MRN: [id number] Date of Birth: [birth year]

## 2016-11-11 ENCOUNTER — Telehealth (HOSPITAL_COMMUNITY): Payer: Self-pay | Admitting: Internal Medicine

## 2016-11-11 ENCOUNTER — Ambulatory Visit (HOSPITAL_COMMUNITY): Payer: Medicare Other

## 2016-11-11 DIAGNOSIS — M25511 Pain in right shoulder: Secondary | ICD-10-CM | POA: Diagnosis not present

## 2016-11-11 DIAGNOSIS — J449 Chronic obstructive pulmonary disease, unspecified: Secondary | ICD-10-CM | POA: Diagnosis not present

## 2016-11-11 NOTE — Telephone Encounter (Signed)
11/11/16  daughter called to reschedule said that patient had a busy day today

## 2016-11-12 ENCOUNTER — Ambulatory Visit (HOSPITAL_COMMUNITY): Payer: Medicare Other | Admitting: Physical Therapy

## 2016-11-12 DIAGNOSIS — J449 Chronic obstructive pulmonary disease, unspecified: Secondary | ICD-10-CM | POA: Diagnosis not present

## 2016-11-12 DIAGNOSIS — S81802S Unspecified open wound, left lower leg, sequela: Secondary | ICD-10-CM | POA: Diagnosis not present

## 2016-11-12 DIAGNOSIS — M79662 Pain in left lower leg: Secondary | ICD-10-CM

## 2016-11-16 ENCOUNTER — Ambulatory Visit (HOSPITAL_COMMUNITY): Payer: Medicare Other | Admitting: Physical Therapy

## 2016-11-16 ENCOUNTER — Encounter (HOSPITAL_COMMUNITY): Payer: Self-pay | Admitting: Physical Therapy

## 2016-11-16 ENCOUNTER — Other Ambulatory Visit: Payer: Self-pay

## 2016-11-16 DIAGNOSIS — S81802S Unspecified open wound, left lower leg, sequela: Secondary | ICD-10-CM

## 2016-11-16 DIAGNOSIS — M79662 Pain in left lower leg: Secondary | ICD-10-CM

## 2016-11-16 NOTE — Therapy (Signed)
[place], [place], [address] Phone: [phone]   Fax:  [phone]  Wound Care Therapy  Patient Details  Name: Bridget Ball MRN: [id number] Date of Birth: 10/10/1928 No Data Recorded  Encounter Date: 11/12/2016  PT End of Session - 11/16/16 1200    Visit Number  6    Number of Visits  8    Date for PT Re-Evaluation  11/27/16    Authorization Type  UHC medicare    Authorization - Visit Number  6    Authorization - Number of Visits  8    PT Start Time  1120    PT Stop Time  1145    PT Time Calculation (min)  25 min    Activity Tolerance  Patient tolerated treatment well    Behavior During Therapy  [facility] for tasks assessed/performed       Past Medical History:  Diagnosis Date  . Anxiety   . Arthritis   . Attention to nephrostomy ([place])    PT HAS NEPHROSTOMY TUBE IN PLACE  . Bruises easily   . Cancer [facility])    Skin cancer  . Chronic diarrhea   . Colitis   . COPD (chronic obstructive pulmonary disease) ([place])   . Dementia   . Depression   . GERD (gastroesophageal reflux disease)   . Hiatal hernia   . Hx of bronchitis   . Hx of pulmonary edema JULY 2015  . Hx of pulmonary edema JULY 2015  . Hx of septic shock JULY 2015  . Hydronephrosis, left   . Hyperlipidemia   . Memory difficulties   . Microscopic colitis   . Myocardial infarction [facility])    unknown time   . Renal insufficiency   . Seizures ([place])    last seizure was 4-5 years ago with "brain bleed"  . Shortness of breath    with exertion  . Stroke [facility])    TIA four years ago / stroke NOV 2014    Past Surgical History:  Procedure Laterality Date  . APPENDECTOMY    . ENDOVASCULAR REPAIR OF POPLITEAL ARTERY ANEURYSM  02/05/2016  . KNEE ARTHROSCOPY Right   . SKIN CANCER EXCISION    . TONSILLECTOMY    . TUBAL LIGATION      There were no vitals filed for this visit.              Wound Therapy - 11/16/16 1158    Subjective  Pt  reports no issues or pain.    Patient and Family Stated Goals  wound to heal    Date of Onset  11/19/16    Prior Treatments  self care     Evaluation and Treatment Procedures Explained to Patient/Family  Yes    Evaluation and Treatment Procedures  agreed to    Wound Properties Date First Assessed: 10/28/16 Time First Assessed: 1127 Wound Type: Laceration Location: Leg Location Orientation: Left;Anterior Wound Description (Comments): on lower shin Present on Admission: Yes   Dressing Type  Impregnated gauze (petrolatum);Compression wrap    Dressing Status  Old drainage    Dressing Change Frequency  PRN    Site / Wound Assessment  Red;Pink    % Wound base Red or Granulating  80%    % Wound base Other/Granulation Tissue (Comment)  20% pink     Drainage Amount  Scant    Drainage Description  Serosanguineous    Treatment  Cleansed;Debridement (Selective)    Selective  Debridement - Location  lower, more medial wound     Selective Debridement - Tools Used  Forceps    Selective Debridement - Tissue Removed  biofilm on wound bed    Wound Therapy - Clinical Statement  Wound nearly 100% healed with only small amount remaining.  Able to discontinue profore and complete with small bandage using medipore tape and netting.  Pt reported overall comfort.      Wound Therapy - Functional Problem List  pain with walking     Factors Delaying/Impairing Wound Healing  Multiple medical problems;Vascular compromise    Hydrotherapy Plan  Debridement;Dressing change    Wound Therapy - Current Recommendations  PT    Wound Plan  cleansing, moisturizing, debridement and dressing change    Dressing   xeroform, gauze, medipore tape                PT Short Term Goals - 10/28/16 1213      PT SHORT TERM GOAL #1   Title  PT wound to be 80% granulated to reduce risk of cellulitis     Time  2    Period  Weeks    Status  New    Target Date  11/11/16      PT SHORT TERM GOAL #2   Title  PT and daughter to be  able to verbalize the risk of cellulitis; the signs and symptoms and the need to seek medical attention     Time  2    Period  Weeks        PT Long Term Goals - 10/28/16 1215      PT LONG TERM GOAL #1   Title  Pt wound to be 100% granulated to reduce risk for cellulitis     Time  4    Period  Weeks    Status  New    Target Date  11/25/16      PT LONG TERM GOAL #2   Title  Pt wound to be decreased in size to no greater than 2 x 2 cm to allow daughter to feel comfortable is self care for discharge.     Time  4    Period  Weeks    Status  New      PT LONG TERM GOAL #3   Title  PT Pain level with Left leg to be no greater than a 3/10 to allow pt to be able to walk for 5 minutes without increased pain.     Time  4    Period  Weeks    Status  New              Patient will benefit from skilled therapeutic intervention in order to improve the following deficits and impairments:     Visit Diagnosis: Pain in left lower leg  Unspecified open wound, left lower leg, sequela     Problem List Patient Active Problem List   Diagnosis Date Noted  . History of stroke 10/20/2016  . TIA (transient ischemic attack) 10/12/2016  . Acute urinary retention 10/12/2016  . Popliteal artery aneurysm ([place]) 02/05/2016  . Malnutrition of moderate degree 12/15/2015  . DVT of popliteal vein ([place]) 12/14/2015  . Aneurysm of left internal iliac artery ([place]) 12/14/2015  . Sepsis affecting skin ([place]) 12/13/2015  . Dementia 12/13/2015  . GERD (gastroesophageal reflux disease) 12/13/2015  . Severe protein-calorie malnutrition ([place]) 12/13/2015  . Cellulitis of right leg 11/24/2015  . Dyslipidemia 11/24/2015  . Erythema of  lower extremity 11/24/2015  . Anemia 11/01/2015  . Esophageal reflux 08/17/2013  . COPD (chronic obstructive pulmonary disease) ([place]) 08/11/2013  . NSTEMI (non-ST elevated myocardial infarction) type 2 07/26/2013  . Dehydration 07/25/2013  . AKI (acute kidney injury) ([place])  07/25/2013  . Colitis 07/08/2013  . Hydronephrosis, left 07/08/2013  . Microscopic colitis 06/27/2013  . Memory loss 04/25/2013  . Depression 04/25/2013  . SAH (subarachnoid hemorrhage) ([place]) 11/22/2012  . Seizure disorder secondary to prior [facility] 11/22/2012   Teena Irani, PTA/CLT [phone]  Teena Irani 11/16/2016, 12:00 PM  [place] [address] [place], [place], [address] Phone: [phone]   Fax:  [phone]  Name: Bridget Ball MRN: [id number] Date of Birth: 1928-01-29

## 2016-11-16 NOTE — Therapy (Addendum)
[place], [place], [address] Phone: [phone]   Fax:  [phone]  Wound Care Therapy/Discharge  Patient Details  Name: Bridget Ball MRN: [id number] Date of Birth: 1928-05-16 No Data Recorded  Encounter Date: 11/16/2016  PT End of Session - 11/16/16 1501    Visit Number  7    Number of Visits  7    Date for PT Re-Evaluation  11/27/16    Authorization Type  UHC medicare    Authorization - Visit Number  7    Authorization - Number of Visits  7    PT Start Time  1430    PT Stop Time  1450    PT Time Calculation (min)  20 min    Activity Tolerance  Patient tolerated treatment well    Behavior During Therapy  [facility] for tasks assessed/performed       Past Medical History:  Diagnosis Date  . Anxiety   . Arthritis   . Attention to nephrostomy ([place])    PT HAS NEPHROSTOMY TUBE IN PLACE  . Bruises easily   . Cancer [facility])    Skin cancer  . Chronic diarrhea   . Colitis   . COPD (chronic obstructive pulmonary disease) ([place])   . Dementia   . Depression   . GERD (gastroesophageal reflux disease)   . Hiatal hernia   . Hx of bronchitis   . Hx of pulmonary edema JULY 2015  . Hx of pulmonary edema JULY 2015  . Hx of septic shock JULY 2015  . Hydronephrosis, left   . Hyperlipidemia   . Memory difficulties   . Microscopic colitis   . Myocardial infarction [facility])    unknown time   . Renal insufficiency   . Seizures ([place])    last seizure was 4-5 years ago with "brain bleed"  . Shortness of breath    with exertion  . Stroke [facility])    TIA four years ago / stroke NOV 2014    Past Surgical History:  Procedure Laterality Date  . APPENDECTOMY    . ENDOVASCULAR REPAIR OF POPLITEAL ARTERY ANEURYSM  02/05/2016  . KNEE ARTHROSCOPY Right   . SKIN CANCER EXCISION    . TONSILLECTOMY    . TUBAL LIGATION      There were no vitals filed for this visit.              Wound Therapy - 11/16/16 1456    Subjective  Pt reports no issues or pain.    Patient and Family Stated Goals  wound to heal    Date of Onset  11/19/16    Prior Treatments  self care     Pain Assessment  No/denies pain    Evaluation and Treatment Procedures Explained to Patient/Family  Yes    Evaluation and Treatment Procedures  agreed to    Wound Properties Date First Assessed: 10/28/16 Time First Assessed: 1127 Wound Type: Laceration Location: Leg Location Orientation: Left;Anterior Wound Description (Comments): on lower shin Present on Admission: Yes   Dressing Type  Impregnated gauze (petrolatum);Compression wrap    Dressing Changed  Changed    Dressing Status  Clean;Dry    Dressing Change Frequency  PRN    Site / Wound Assessment  Red;Pink    % Wound base Red or Granulating  100%    % Wound base Other/Granulation Tissue (Comment)  -- pink     Wound Length (cm)  1 cm was 4.5  Wound Width (cm)  1 cm was 3.3     Wound Surface Area (cm^2)  1 cm^2    Drainage Amount  None    Treatment  Cleansed;Debridement (Selective)    Selective Debridement - Location  lower, more medial wound     Selective Debridement - Tools Used  Other (comment) moistened gauze     Selective Debridement - Tissue Removed  dry skin    Wound Therapy - Clinical Statement  No need for mechanical debridement.  Wound can now be taken care of by family. Instructed family to cleanse pat dry and moisturize LE everyday.  Pt daughter had no questions regarding care for the wound.     Wound Therapy - Functional Problem List  none    Factors Delaying/Impairing Wound Healing  Multiple medical problems;Vascular compromise    Hydrotherapy Plan  -- Discharge to family for care of wound.    Wound Therapy - Current Recommendations  PT    Wound Plan  -- discharge no need for skilled care.     Dressing   bandaid                 PT Short Term Goals - 2016-11-26 1503      PT SHORT TERM GOAL #1   Title  PT wound to be 80% granulated to reduce risk of  cellulitis     Time  2    Period  Weeks    Status  Achieved      PT SHORT TERM GOAL #2   Title  PT and daughter to be able to verbalize the risk of cellulitis; the signs and symptoms and the need to seek medical attention     Time  2    Period  Weeks    Status  Achieved        PT Long Term Goals - 11/26/16 1503      PT LONG TERM GOAL #1   Title  Pt wound to be 100% granulated to reduce risk for cellulitis     Time  4    Period  Weeks    Status  Achieved      PT LONG TERM GOAL #2   Title  Pt wound to be decreased in size to no greater than 2 x 2 cm to allow daughter to feel comfortable is self care for discharge.     Time  4    Period  Weeks    Status  Achieved      PT LONG TERM GOAL #3   Title  PT Pain level with Left leg to be no greater than a 3/10 to allow pt to be able to walk for 5 minutes without increased pain.     Time  4    Period  Weeks    Status  Achieved            Plan - 2016/11/26 1502    Clinical Impression Statement  see above    PT Next Visit Plan  Discharge pt no longer needs skilled therapy.       Patient will benefit from skilled therapeutic intervention in order to improve the following deficits and impairments:  Pain, Other (comment)(non-healing wound )  Visit Diagnosis: Pain in left lower leg  Unspecified open wound, left lower leg, sequela  G-Codes - 11-26-16 1503    Functional Limitation  Other PT primary    Other PT Primary Goal Status (T5176)  At least 1 percent but less than  20 percent impaired, limited or restricted    Other PT Primary Discharge Status [phone])  At least 1 percent but less than 20 percent impaired, limited or restricted        Problem List Patient Active Problem List   Diagnosis Date Noted  . History of stroke 10/20/2016  . TIA (transient ischemic attack) 10/12/2016  . Acute urinary retention 10/12/2016  . Popliteal artery aneurysm ([place]) 02/05/2016  . Malnutrition of moderate degree 12/15/2015  . DVT of  popliteal vein ([place]) 12/14/2015  . Aneurysm of left internal iliac artery ([place]) 12/14/2015  . Sepsis affecting skin ([place]) 12/13/2015  . Dementia 12/13/2015  . GERD (gastroesophageal reflux disease) 12/13/2015  . Severe protein-calorie malnutrition ([place]) 12/13/2015  . Cellulitis of right leg 11/24/2015  . Dyslipidemia 11/24/2015  . Erythema of lower extremity 11/24/2015  . Anemia 11/01/2015  . Esophageal reflux 08/17/2013  . COPD (chronic obstructive pulmonary disease) ([place]) 08/11/2013  . NSTEMI (non-ST elevated myocardial infarction) type 2 07/26/2013  . Dehydration 07/25/2013  . AKI (acute kidney injury) ([place]) 07/25/2013  . Colitis 07/08/2013  . Hydronephrosis, left 07/08/2013  . Microscopic colitis 06/27/2013  . Memory loss 04/25/2013  . Depression 04/25/2013  . SAH (subarachnoid hemorrhage) ([place]) 11/22/2012  . Seizure disorder secondary to prior [facility] 11/22/2012   Rayetta Humphrey, PT CLT [phone] 11/16/2016, 3:04 PM  [place], [place], [address] Phone: [phone]   Fax:  [phone]  Name: Bridget Ball MRN: [id number] Date of Birth: 03-Dec-1928  PHYSICAL THERAPY DISCHARGE SUMMARY  Visits from Start of Care: 7  Current functional level related to goals / functional outcomes: See above Remaining deficits: See above   Education / Equipment: Care of wound  Plan: Patient agrees to discharge.  Patient goals were met. Patient is being discharged due to meeting the stated rehab goals.  ?????    Rayetta Humphrey, [place] CLT [phone]

## 2016-11-18 ENCOUNTER — Ambulatory Visit (HOSPITAL_COMMUNITY): Payer: Medicare Other | Admitting: Physical Therapy

## 2016-12-03 ENCOUNTER — Ambulatory Visit (HOSPITAL_COMMUNITY): Payer: Medicare Other

## 2016-12-07 ENCOUNTER — Ambulatory Visit: Payer: Medicare Other

## 2016-12-07 ENCOUNTER — Encounter: Payer: Self-pay | Admitting: Orthopedic Surgery

## 2016-12-07 ENCOUNTER — Ambulatory Visit (INDEPENDENT_AMBULATORY_CARE_PROVIDER_SITE_OTHER): Payer: Medicare Other

## 2016-12-07 ENCOUNTER — Ambulatory Visit: Payer: Medicare Other | Admitting: Orthopedic Surgery

## 2016-12-07 VITALS — BP 103/70 | HR 101 | Ht 61.0 in | Wt 99.0 lb

## 2016-12-07 DIAGNOSIS — M25512 Pain in left shoulder: Secondary | ICD-10-CM | POA: Diagnosis not present

## 2016-12-07 DIAGNOSIS — M19011 Primary osteoarthritis, right shoulder: Secondary | ICD-10-CM | POA: Diagnosis not present

## 2016-12-07 DIAGNOSIS — M25511 Pain in right shoulder: Secondary | ICD-10-CM

## 2016-12-07 DIAGNOSIS — M75101 Unspecified rotator cuff tear or rupture of right shoulder, not specified as traumatic: Secondary | ICD-10-CM

## 2016-12-07 DIAGNOSIS — M75102 Unspecified rotator cuff tear or rupture of left shoulder, not specified as traumatic: Secondary | ICD-10-CM | POA: Diagnosis not present

## 2016-12-07 NOTE — Progress Notes (Addendum)
NEW PATIENT OFFICE VISIT    Chief Complaint  Patient presents with  . Shoulder Pain    bilateral, decreased ROM and pain right x 53months left x 3 weeks     83 year old female comes in for evaluation of bilateral shoulder pain  On the right side she has had pain in the right shoulder for about 6 months associated with dull aching sensation in the muscle of the deltoid associated with difficulty raising her arm above her head and nighttime symptoms  On the left side about 3 weeks ago she fell and injured her left shoulder no films were taken but she does complain of similar symptoms of dull aching pain and difficulty with forward elevation  She did try some Mobic for pain without any improvement  Prior treatment includes various creams and muscle rubs.  The Mobic seem to make her feel funny so she did not really take it for very long    Review of Systems  Constitutional: Negative for fever.  Respiratory: Negative for shortness of breath.   Cardiovascular: Negative for chest pain.  Skin: Negative.   Neurological: Negative for tingling.     Past Medical History:  Diagnosis Date  . Anxiety   . Arthritis   . Attention to nephrostomy ([place])    PT HAS NEPHROSTOMY TUBE IN PLACE  . Bruises easily   . Cancer [facility])    Skin cancer  . Chronic diarrhea   . Colitis   . COPD (chronic obstructive pulmonary disease) ([place])   . Dementia   . Depression   . GERD (gastroesophageal reflux disease)   . Hiatal hernia   . Hx of bronchitis   . Hx of pulmonary edema JULY 2015  . Hx of pulmonary edema JULY 2015  . Hx of septic shock JULY 2015  . Hydronephrosis, left   . Hyperlipidemia   . Memory difficulties   . Microscopic colitis   . Myocardial infarction [facility])    unknown time   . Renal insufficiency   . Seizures ([place])    last seizure was 4-5 years ago with "brain bleed"  . Shortness of breath    with exertion  . Stroke [facility])    TIA four years ago / stroke NOV 2014    Past  Surgical History:  Procedure Laterality Date  . APPENDECTOMY    . BREAST BIOPSY Right 02/02/2014   Procedure: RIGHT BREAST BIOPSY;  Surgeon: Jamesetta So, MD;  Location: [facility] ORS;  Service: General;  Laterality: Right;  . COLONOSCOPY N/A 05/24/2013   Procedure: COLONOSCOPY;  Surgeon: Rogene Houston, MD;  Location: [facility] ENDO SUITE;  Service: Endoscopy;  Laterality: N/A;  100  . CYSTOSCOPY W/ URETERAL STENT PLACEMENT Left 08/06/2015   Procedure: CYSTOSCOPY WITH LEFT URETERAL STENT EXCHANGE - Sammie Bench;  Surgeon: Franchot Gallo, MD;  Location: [facility] ORS;  Service: Urology;  Laterality: Left;  . CYSTOSCOPY W/ URETERAL STENT PLACEMENT Left 07/07/2016   Procedure: CYSTOSCOPY, LEFT RETROGRADE PYELOGRAM WITH LEFT URETERAL STENT REPLACEMENT;  Surgeon: Franchot Gallo, MD;  Location: [facility] ORS;  Service: Urology;  Laterality: Left;  . CYSTOSCOPY WITH RETROGRADE PYELOGRAM, URETEROSCOPY AND STENT PLACEMENT Left 09/18/2013   Procedure: CYSTOSCOPY WITH RETROGRADE PYELOGRAM,  URETEROSCOPY AND  STENT PLACEMENT, removal of nephrostomy tube;  Surgeon: Jorja Loa, MD;  Location: [facility] ORS;  Service: Urology;  Laterality: Left;  . CYSTOSCOPY WITH STENT PLACEMENT Left 09/04/2014   Procedure: CYSTOSCOPY WITH LEFT J2 STENT EXCHANGE;  Surgeon: Franchot Gallo, MD;  Location: [facility]  ORS;  Service: Urology;  Laterality: Left;  . ENDOVASCULAR REPAIR OF POPLITEAL ARTERY ANEURYSM  02/05/2016  . ESOPHAGOGASTRODUODENOSCOPY N/A 05/24/2013   Procedure: ESOPHAGOGASTRODUODENOSCOPY (EGD);  Surgeon: Rogene Houston, MD;  Location: [facility] ENDO SUITE;  Service: Endoscopy;  Laterality: N/A;  . FALSE ANEURYSM REPAIR Right 02/05/2016   Procedure: REPAIR RIGHT POLITEAL  ANEURYSM;  Surgeon: Serafina Mitchell, MD;  Location: [place];  Service: Vascular;  Laterality: Right;  . KNEE ARTHROSCOPY Right   . SKIN CANCER EXCISION    . TONSILLECTOMY    . TUBAL LIGATION      Family History  Problem Relation Age of Onset  . Diabetes Mother   . Stroke Mother    . Heart attack Father    Social History   Tobacco Use  . Smoking status: Former Smoker    Packs/day: 1.00    Years: 50.00    Pack years: 50.00    Types: Cigarettes    Last attempt to quit: 11/13/1997    Years since quitting: 19.0  . Smokeless tobacco: Never Used  Substance Use Topics  . Alcohol use: Yes    Alcohol/week: 4.2 oz    Types: 7 Glasses of wine per week    Comment: 2 glasses a night wine (none in a year)  . Drug use: No   Allergies  Allergen Reactions  . Penicillins Anaphylaxis    Throat swelling as a child. Tolerated ceftriaxone 07/29/13.     No outpatient medications have been marked as taking for the 12/07/16 encounter (Office Visit) with Carole Civil, MD.    BP 103/70   Pulse (!) 101   Ht 5\' 1"  (1.549 m)   Wt 99 lb (44.9 kg)   BMI 18.71 kg/m   Physical Exam  Constitutional: She is oriented to person, place, and time. She appears well-developed and well-nourished.  Thin ectomorphic  Neurological: She is alert and oriented to person, place, and time. Gait normal.  Psychiatric: She has a normal mood and affect.    Ortho Exam Right shoulder.  Deltoid tenderness.  120 degrees of flexion no limitation in external rotation mild internal rotation limitation.  Shoulder stable in abduction external rotation.  Mild but not significant weakness in abduction skin without erythema laceration or warmth pulse normal and axillary lymph nodes negative normal sensation right hand  Left shoulder flexion 110 degrees external rotation normal limits internal rotation limited to back pocket.  Stable in abduction external rotation mild weakness in abduction skin no laceration or ulceration no erythema no warmth pulse good lymph nodes negative and sensation intact  X-rays  She had a CT scan of her head and neck on October 13, 2016 she has spondylosis with anterolisthesis of C3 on C4  AP and lateral both shoulders  Bilateral shoulder x-rays  Bilateral shoulder pain  decreased range of motion  X-ray left shoulder AP lateral glenohumeral joint looks normal.  No proximal migration of the humeral head is noted.  Mild osteopenia.  Acromion may have a gentle curve probably a type I  Right shoulder x-ray AP and lateral  Glenohumeral joint space looks relatively normal however there is proximal migration of the humerus on this view and there is a mild small inferior metaphyseal osteophyte with cyst formation in the greater tuberosity and mild head sclerosis  Impression osteoarthritis right shoulder  Acromion is gently curved probably a type I   Diagnosis   Encounter Diagnoses  Name Primary?  . Acute pain of both shoulders   .  Bilateral rotator cuff syndrome Yes  . Arthritis of right shoulder region     Procedure note the subacromial injection shoulder left   Verbal consent was obtained to inject the  Left   Shoulder  Timeout was completed to confirm the injection site is a subacromial space of the  left  shoulder  Medication used Depo-Medrol 40 mg and lidocaine 1% 3 cc  Anesthesia was provided by ethyl chloride  The injection was performed in the left  posterior subacromial space. After pinning the skin with alcohol and anesthetized the skin with ethyl chloride the subacromial space was injected using a 20-gauge needle. There were no complications  Sterile dressing was applied.          Procedure note the subacromial injection shoulder RIGHT  Verbal consent was obtained to inject the  RIGHT   Shoulder  Timeout was completed to confirm the injection site is a subacromial space of the  RIGHT  shoulder   Medication used Depo-Medrol 40 mg and lidocaine 1% 3 cc  Anesthesia was provided by ethyl chloride  The injection was performed in the RIGHT  posterior subacromial space. After pinning the skin with alcohol and anesthetized the skin with ethyl chloride the subacromial space was injected using a 20-gauge needle. There were no  complications  Sterile dressing was applied.   PLAN:   Home exercises and bilateral shoulder injection

## 2016-12-10 DIAGNOSIS — N390 Urinary tract infection, site not specified: Secondary | ICD-10-CM | POA: Diagnosis not present

## 2016-12-10 DIAGNOSIS — M7550 Bursitis of unspecified shoulder: Secondary | ICD-10-CM | POA: Diagnosis not present

## 2016-12-10 DIAGNOSIS — Z8669 Personal history of other diseases of the nervous system and sense organs: Secondary | ICD-10-CM | POA: Diagnosis not present

## 2016-12-12 DIAGNOSIS — J449 Chronic obstructive pulmonary disease, unspecified: Secondary | ICD-10-CM | POA: Diagnosis not present

## 2016-12-22 ENCOUNTER — Ambulatory Visit: Payer: Medicare Other | Admitting: Urology

## 2016-12-22 ENCOUNTER — Other Ambulatory Visit (HOSPITAL_COMMUNITY)
Admission: RE | Admit: 2016-12-22 | Discharge: 2016-12-22 | Disposition: A | Discharge status: Home / Self Care | Payer: Medicare Other | Source: Other Acute Inpatient Hospital | Attending: Urology | Admitting: Urology

## 2016-12-22 DIAGNOSIS — R310 Gross hematuria: Secondary | ICD-10-CM

## 2016-12-22 DIAGNOSIS — N952 Postmenopausal atrophic vaginitis: Secondary | ICD-10-CM | POA: Diagnosis not present

## 2016-12-22 DIAGNOSIS — N131 Hydronephrosis with ureteral stricture, not elsewhere classified: Secondary | ICD-10-CM | POA: Insufficient documentation

## 2016-12-24 ENCOUNTER — Telehealth: Payer: Self-pay | Admitting: Orthopedic Surgery

## 2016-12-24 LAB — URINE CULTURE

## 2016-12-24 NOTE — Telephone Encounter (Signed)
Next available appointment per Dr Aline Brochure.

## 2016-12-24 NOTE — Telephone Encounter (Signed)
Called patient (daughter) - left voice message to return call for scheduling.

## 2016-12-24 NOTE — Telephone Encounter (Signed)
Patient's daughter Ann Held (designated contact) called to relay that patient still is complaining of both right and left shoulder pain, and that injections only helped a little.  Asking for another appointment. Offered next available - 2nd week in January, however, if any recommendations other than appointment, please advise. (Pharmacy is [facility]).  Daughter's ph# [phone]

## 2017-01-11 ENCOUNTER — Ambulatory Visit: Payer: Medicare Other | Admitting: Orthopedic Surgery

## 2017-01-11 ENCOUNTER — Encounter: Payer: Self-pay | Admitting: Orthopedic Surgery

## 2017-01-11 VITALS — BP 120/77 | HR 105 | Ht 61.0 in | Wt 98.0 lb

## 2017-01-11 DIAGNOSIS — M25512 Pain in left shoulder: Secondary | ICD-10-CM | POA: Diagnosis not present

## 2017-01-11 DIAGNOSIS — G8929 Other chronic pain: Secondary | ICD-10-CM

## 2017-01-11 DIAGNOSIS — M25511 Pain in right shoulder: Secondary | ICD-10-CM | POA: Diagnosis not present

## 2017-01-11 NOTE — Progress Notes (Signed)
[place] 84 year old female previously seen for bilateral shoulder pain started on home exercise program comes back for reevaluation  She complains of inability to reach behind her back and weakness in both shoulders  Exam today shows that she can elevate both shoulders left better than right but has 150 degrees of flexion but only limited internal rotation bilaterally and mild weakness in the rotator cuff  Recommend twice a week therapy for 6 weeks and follow-up in 7 weeks.

## 2017-01-11 NOTE — Patient Instructions (Signed)
  Physical therapy has been ordered for you at [place] [phone] is the phone number to call if you want to call to schedule. Please let us know if you do not hear anything within one week.  

## 2017-01-12 DIAGNOSIS — J449 Chronic obstructive pulmonary disease, unspecified: Secondary | ICD-10-CM | POA: Diagnosis not present

## 2017-01-18 DIAGNOSIS — L989 Disorder of the skin and subcutaneous tissue, unspecified: Secondary | ICD-10-CM | POA: Diagnosis not present

## 2017-01-20 ENCOUNTER — Ambulatory Visit (HOSPITAL_COMMUNITY): Payer: Medicare Other

## 2017-01-20 ENCOUNTER — Telehealth (HOSPITAL_COMMUNITY): Payer: Self-pay | Admitting: Internal Medicine

## 2017-01-20 NOTE — Telephone Encounter (Signed)
01/20/17  daughter left a message that they wanted to reschedule this appt

## 2017-01-22 ENCOUNTER — Ambulatory Visit (HOSPITAL_COMMUNITY): Payer: Medicare Other | Attending: Internal Medicine

## 2017-01-22 ENCOUNTER — Other Ambulatory Visit: Payer: Self-pay

## 2017-01-22 ENCOUNTER — Encounter (HOSPITAL_COMMUNITY): Payer: Self-pay

## 2017-01-22 DIAGNOSIS — M79662 Pain in left lower leg: Secondary | ICD-10-CM | POA: Diagnosis not present

## 2017-01-22 DIAGNOSIS — M25511 Pain in right shoulder: Secondary | ICD-10-CM | POA: Diagnosis not present

## 2017-01-22 DIAGNOSIS — S81802S Unspecified open wound, left lower leg, sequela: Secondary | ICD-10-CM | POA: Diagnosis not present

## 2017-01-22 DIAGNOSIS — M25612 Stiffness of left shoulder, not elsewhere classified: Secondary | ICD-10-CM | POA: Insufficient documentation

## 2017-01-22 DIAGNOSIS — M25512 Pain in left shoulder: Secondary | ICD-10-CM | POA: Insufficient documentation

## 2017-01-22 DIAGNOSIS — M25611 Stiffness of right shoulder, not elsewhere classified: Secondary | ICD-10-CM | POA: Diagnosis not present

## 2017-01-22 DIAGNOSIS — S51802S Unspecified open wound of left forearm, sequela: Secondary | ICD-10-CM | POA: Insufficient documentation

## 2017-01-22 DIAGNOSIS — R29898 Other symptoms and signs involving the musculoskeletal system: Secondary | ICD-10-CM | POA: Diagnosis not present

## 2017-01-22 NOTE — Patient Instructions (Signed)
Complete each table slide for 10-15 repetitions. Complete 1 set. 2-3 times a day.   SHOULDER: Flexion On Table   Place hands on table, elbows straight. Move hips away from body. Press hands down into table. per week  Abduction (Passive)   With arm out to side, resting on table, lower head toward arm, keeping trunk away from table.   Copyright  VHI. All rights reserved.     Internal Rotation (Assistive)   Seated with elbow bent at right angle and held against side, slide arm on table surface in an inward arc.  Activity: Use this motion to brush crumbs off the table.  Copyright  VHI. All rights reserved.

## 2017-01-22 NOTE — Therapy (Signed)
[facility] [address] [place], [place], [address] Phone: [phone]   Fax:  [phone]  Occupational Therapy Evaluation  Patient Details  Name: Bridget Ball MRN: [id number] Date of Birth: 10-27-1928 Referring Provider: Arther Abbott, MD   Encounter Date: 01/22/2017  OT End of Session - 01/22/17 1619    Visit Number  1    Number of Visits  12    Date for OT Re-Evaluation  03/05/17    Authorization Type  [employer] medicare    OT Start Time  1523    OT Stop Time  1610    OT Time Calculation (min)  47 min    Activity Tolerance  Patient tolerated treatment well    Behavior During Therapy  [facility] for tasks assessed/performed       Past Medical History:  Diagnosis Date  . Anxiety   . Arthritis   . Attention to nephrostomy ([place])    PT HAS NEPHROSTOMY TUBE IN PLACE  . Bruises easily   . Cancer [facility])    Skin cancer  . Chronic diarrhea   . Colitis   . COPD (chronic obstructive pulmonary disease) ([place])   . Dementia   . Depression   . GERD (gastroesophageal reflux disease)   . Hiatal hernia   . Hx of bronchitis   . Hx of pulmonary edema JULY 2015  . Hx of pulmonary edema JULY 2015  . Hx of septic shock JULY 2015  . Hydronephrosis, left   . Hyperlipidemia   . Memory difficulties   . Microscopic colitis   . Myocardial infarction [facility])    unknown time   . Renal insufficiency   . Seizures ([place])    last seizure was 4-5 years ago with "brain bleed"  . Shortness of breath    with exertion  . Stroke [facility])    TIA four years ago / stroke NOV 2014    Past Surgical History:  Procedure Laterality Date  . APPENDECTOMY    . BREAST BIOPSY Right 02/02/2014   Procedure: RIGHT BREAST BIOPSY;  Surgeon: Jamesetta So, MD;  Location: [facility] ORS;  Service: General;  Laterality: Right;  . COLONOSCOPY N/A 05/24/2013   Procedure: COLONOSCOPY;  Surgeon: Rogene Houston, MD;  Location: [facility] ENDO SUITE;  Service: Endoscopy;  Laterality: N/A;  100  .  CYSTOSCOPY W/ URETERAL STENT PLACEMENT Left 08/06/2015   Procedure: CYSTOSCOPY WITH LEFT URETERAL STENT EXCHANGE - Sammie Bench;  Surgeon: Franchot Gallo, MD;  Location: [facility] ORS;  Service: Urology;  Laterality: Left;  . CYSTOSCOPY W/ URETERAL STENT PLACEMENT Left 07/07/2016   Procedure: CYSTOSCOPY, LEFT RETROGRADE PYELOGRAM WITH LEFT URETERAL STENT REPLACEMENT;  Surgeon: Franchot Gallo, MD;  Location: [facility] ORS;  Service: Urology;  Laterality: Left;  . CYSTOSCOPY WITH RETROGRADE PYELOGRAM, URETEROSCOPY AND STENT PLACEMENT Left 09/18/2013   Procedure: CYSTOSCOPY WITH RETROGRADE PYELOGRAM,  URETEROSCOPY AND  STENT PLACEMENT, removal of nephrostomy tube;  Surgeon: Jorja Loa, MD;  Location[facility];  Service: Urology;  Laterality: Left;  . CYSTOSCOPY WITH STENT PLACEMENT Left 09/04/2014   Procedure: CYSTOSCOPY WITH LEFT J2 STENT EXCHANGE;  Surgeon: Franchot Gallo, MD;  Location: [facility] ORS;  Service: Urology;  Laterality: Left;  . ENDOVASCULAR REPAIR OF POPLITEAL ARTERY ANEURYSM  02/05/2016  . ESOPHAGOGASTRODUODENOSCOPY N/A 05/24/2013   Procedure: ESOPHAGOGASTRODUODENOSCOPY (EGD);  Surgeon: Rogene Houston, MD;  [facility];  Service: Endoscopy;  Laterality: N/A;  . FALSE ANEURYSM REPAIR Right 02/05/2016   Procedure: REPAIR RIGHT POLITEAL  ANEURYSM;  Surgeon: Serafina Mitchell, MD;  Location: [place];  Service: Vascular;  Laterality: Right;  . KNEE ARTHROSCOPY Right   . SKIN CANCER EXCISION    . TONSILLECTOMY    . TUBAL LIGATION      There were no vitals filed for this visit.  Subjective Assessment - 01/22/17 1524    Subjective   S: I think they gradually got worse.    Pertinent History  patient is a 84 y/o female S/P bilateral shoulder pain which has been progressively getting worse since approximately Decemeber 2018. patient did try to complete home exercises on her own without any progress. Right shoulder is worse than left shoulder. X-ray performed in December 2018 shows ostoarthritis  in the right shoulder. Dr. Aline Brochure has referred patient to occupational therapy for evaluation and treatment.     Special Tests  FOTO score: 53/100    Patient Stated Goals  To be able to use my arms.    Currently in Pain?  Yes    Pain Score  7     Pain Location  Shoulder    Pain Orientation  Right;Left    Pain Descriptors / Indicators  Aching    Pain Type  Acute pain    Pain Radiating Towards  N/A    Pain Onset  In the past 7 days    Pain Frequency  Constant    Aggravating Factors   Getting dressed. use and movement.     Pain Relieving Factors  Rest    Effect of Pain on Daily Activities  Max effect    Multiple Pain Sites  No        [facility] OT Assessment - 01/22/17 1540      Assessment   Medical Diagnosis  Bilateral shoulder pain    Referring Provider  Arther Abbott, MD    Onset Date/Surgical Date  -- December 2018    Hand Dominance  Right    Prior Therapy  None      Precautions   Precautions  None      Restrictions   Weight Bearing Restrictions  No      Balance Screen   Has the patient fallen in the past 6 months  Yes    How many times?  1 fell over dog one month      [place] With  Family      Prior Function   Level of Independence  Independent    Vocation  Retired      ADL   ADL comments  Difficulty raising arm up, getting dressed, combing hair.      Mobility   Mobility Status  Independent      Written Expression   Dominant Hand  Right      Vision - History   Baseline Vision  No visual deficits      Cognition   Overall Cognitive Status  History of cognitive impairments - at baseline      Observation/Other Assessments   Focus on Therapeutic Outcomes (FOTO)   53/100      ROM / Strength   AROM / PROM / Strength  Strength;PROM;AROM      Palpation   Palpation comment  Mod fascial restrictions in bilateral scapularis and trapezius regions.       AROM   Overall AROM Comments  Assessed seated. IR/er adducted.    AROM Assessment Site   Shoulder    Right/Left Shoulder  Right;Left    Right  Shoulder Flexion  75 Degrees    Right Shoulder ABduction  102 Degrees    Right Shoulder Internal Rotation  90 Degrees    Right Shoulder External Rotation  25 Degrees    Left Shoulder Flexion  135 Degrees    Left Shoulder ABduction  110 Degrees    Left Shoulder Internal Rotation  90 Degrees    Left Shoulder External Rotation  87 Degrees      PROM   Overall PROM Comments  Assessed supine. IR/er adducted.     PROM Assessment Site  Shoulder    Right/Left Shoulder  Left;Right    Right Shoulder Flexion  150 Degrees    Right Shoulder ABduction  140 Degrees    Right Shoulder Internal Rotation  90 Degrees    Right Shoulder External Rotation  73 Degrees    Left Shoulder Flexion  165 Degrees    Left Shoulder ABduction  150 Degrees    Left Shoulder Internal Rotation  90 Degrees    Left Shoulder External Rotation  85 Degrees      Strength   Overall Strength Comments  Assessed seated. IR/er adducted.     Strength Assessment Site  Shoulder    Right/Left Shoulder  Right;Left    Right Shoulder Flexion  3-/5    Right Shoulder ABduction  3-/5    Right Shoulder Internal Rotation  3+/5    Right Shoulder External Rotation  3/5    Left Shoulder Flexion  3+/5    Left Shoulder Extension  3+/5    Left Shoulder ABduction  3+/5    Left Shoulder Internal Rotation  3+/5             OT Education - 01/22/17 1618    Education provided  Yes    Education Details  table slides (mainly for right arm)    Person(s) Educated  Patient    Methods  Explanation;Verbal cues;Handout    Comprehension  Verbalized understanding;Returned demonstration;Need further instruction;Verbal cues required       OT Short Term Goals - 01/22/17 1624      OT SHORT TERM GOAL #1   Title  Patient will be educated and independent with HEP in order to faciliate progress in therapy and allow her to use her BUE's with increased comfort during daily tasks.     Time  3     Period  Weeks    Status  New    Target Date  02/12/17      OT SHORT TERM GOAL #2   Title  Patient will increase BUE P/ROM to [facility] to increase ability to complete dressing tasks with less difficulty.     Time  3    Period  Weeks    Status  New      OT SHORT TERM GOAL #3   Title  Patient will increase BUE strength to 4-/5 to increase ability to complete normal household tasks with greater strength and ease.     Time  3    Period  Weeks    Status  New      OT SHORT TERM GOAL #4   Title  Patient will report a decreased pain level of 5/10 in BUE's while completing daily tasks.     Time  3    Period  Weeks    Status  New        OT Long Term Goals - 01/22/17 1626      OT LONG TERM GOAL #1  Title  Patient will return to highest level of independence while completing all daily tasks using BUE's with reports of decreased pain of 3/10 or less.     Time  6    Period  Weeks    Status  New    Target Date  03/05/17      OT LONG TERM GOAL #2   Title  Patient will increase A/ROM of RUE to [facility] to increase ability to return to using right arm as dominant extremity.     Time  6    Period  Weeks    Status  New      OT LONG TERM GOAL #3   Title  Patient will increase BUE strength to 4/10 overall in order to complete normal household tasks with less difficulty.     Time  6    Period  Weeks    Status  New      OT LONG TERM GOAL #4   Title  Patient will decrease fasical restrictions in min amount or less in order to increase functional mobility needed for reaching overhead.     Time  6    Period  Weeks    Status  New            Plan - 01/22/17 1620    Clinical Impression Statement  A: Patient is a 84 y/o female S/P bilateral shoulder pain causing increased fascial restrictions and decreased ROM and strength resulting in difficulty completing daily tasks using both arms.     Occupational Profile and client history currently impacting functional performance  motivated to return to  prior level of function. strong social support at home.     Occupational performance deficits (Please refer to evaluation for details):  ADL's;Rest and Sleep;Leisure    Rehab Potential  Excellent    Current Impairments/barriers affecting progress:  age, osteoarthritis in right shoulder    OT Frequency  2x / week    OT Duration  6 weeks    OT Treatment/Interventions  Self-care/ADL training;Ultrasound;Patient/family education;DME and/or AE instruction;Cryotherapy;Electrical Stimulation;Moist Heat;Neuromuscular education;Therapeutic activities;Manual Therapy;Therapeutic exercise;Passive range of motion    Plan  P: Patient will benefit from skilled OT services to increase functional performance during daily tasks while using bilateral UE's. Treatment Plan: Myofascial release, manual stretching, P/ROM, AA/ROM, A/ROM, and general strengthening. Next session: Attempt supine AA/ROM.     Clinical Decision Making  Limited treatment options, no task modification necessary    Consulted and Agree with Plan of Care  Patient;Family member/caregiver    Family Member Consulted  Daughter: Lattie Haw       Patient will benefit from skilled therapeutic intervention in order to improve the following deficits and impairments:  Pain, Decreased range of motion, Increased fascial restrictions, Impaired UE functional use, Decreased strength  Visit Diagnosis: Other symptoms and signs involving the musculoskeletal system  Acute pain of left shoulder  Acute pain of right shoulder  Stiffness of right shoulder, not elsewhere classified  Stiffness of left shoulder, not elsewhere classified    Problem List Patient Active Problem List   Diagnosis Date Noted  . History of stroke 10/20/2016  . TIA (transient ischemic attack) 10/12/2016  . Acute urinary retention 10/12/2016  . Popliteal artery aneurysm ([place]) 02/05/2016  . Malnutrition of moderate degree 12/15/2015  . DVT of popliteal vein ([place]) 12/14/2015  . Aneurysm of  left internal iliac artery ([place]) 12/14/2015  . Sepsis affecting skin ([place]) 12/13/2015  . Dementia 12/13/2015  . GERD (gastroesophageal reflux disease)  12/13/2015  . Severe protein-calorie malnutrition ([place]) 12/13/2015  . Cellulitis of right leg 11/24/2015  . Dyslipidemia 11/24/2015  . Erythema of lower extremity 11/24/2015  . Anemia 11/01/2015  . Esophageal reflux 08/17/2013  . COPD (chronic obstructive pulmonary disease) ([place]) 08/11/2013  . NSTEMI (non-ST elevated myocardial infarction) type 2 07/26/2013  . Dehydration 07/25/2013  . AKI (acute kidney injury) ([place]) 07/25/2013  . Colitis 07/08/2013  . Hydronephrosis, left 07/08/2013  . Microscopic colitis 06/27/2013  . Memory loss 04/25/2013  . Depression 04/25/2013  . SAH (subarachnoid hemorrhage) ([place]) 11/22/2012  . Seizure disorder secondary to prior [facility] 11/22/2012   Ailene Ravel, OTR/L,CBIS  [phone]   01/22/2017, 4:29 PM  [facility] [address] [place], [place], [address] Phone: [phone]   Fax:  [phone]  Name: RIELY OETKEN MRN: [id number] Date of Birth: 08/23/1928

## 2017-01-25 DIAGNOSIS — L97929 Non-pressure chronic ulcer of unspecified part of left lower leg with unspecified severity: Secondary | ICD-10-CM | POA: Diagnosis not present

## 2017-01-26 ENCOUNTER — Ambulatory Visit (HOSPITAL_COMMUNITY): Payer: Medicare Other | Admitting: Physical Therapy

## 2017-01-26 ENCOUNTER — Encounter (HOSPITAL_COMMUNITY): Payer: Self-pay | Admitting: Physical Therapy

## 2017-01-26 DIAGNOSIS — M79662 Pain in left lower leg: Secondary | ICD-10-CM

## 2017-01-26 DIAGNOSIS — S81802S Unspecified open wound, left lower leg, sequela: Secondary | ICD-10-CM

## 2017-01-26 DIAGNOSIS — M25612 Stiffness of left shoulder, not elsewhere classified: Secondary | ICD-10-CM | POA: Diagnosis not present

## 2017-01-26 DIAGNOSIS — S51802S Unspecified open wound of left forearm, sequela: Secondary | ICD-10-CM

## 2017-01-26 DIAGNOSIS — M25611 Stiffness of right shoulder, not elsewhere classified: Secondary | ICD-10-CM | POA: Diagnosis not present

## 2017-01-26 DIAGNOSIS — M25511 Pain in right shoulder: Secondary | ICD-10-CM | POA: Diagnosis not present

## 2017-01-26 DIAGNOSIS — M25512 Pain in left shoulder: Secondary | ICD-10-CM | POA: Diagnosis not present

## 2017-01-26 DIAGNOSIS — R29898 Other symptoms and signs involving the musculoskeletal system: Secondary | ICD-10-CM | POA: Diagnosis not present

## 2017-01-26 NOTE — Therapy (Signed)
[place], [place], [address] Phone: [phone]   Fax:  [phone]  Wound Care Evaluation  Patient Details  Name: Bridget Ball MRN: [id number] Date of Birth: Oct 29, 1928 Referring Provider: Edwinna Areola hall   Encounter Date: 01/26/2017  PT End of Session - 01/26/17 1633    Visit Number  1    Number of Visits  8    Date for PT Re-Evaluation  02/25/17    Authorization Type  [employer] medicare    Authorization - Visit Number  1    Authorization - Number of Visits  8    PT Start Time  1520    PT Stop Time  1610    PT Time Calculation (min)  50 min    Activity Tolerance  Patient limited by pain    Behavior During Therapy  Anxious       Past Medical History:  Diagnosis Date  . Anxiety   . Arthritis   . Attention to nephrostomy ([place])    PT HAS NEPHROSTOMY TUBE IN PLACE  . Bruises easily   . Cancer [facility])    Skin cancer  . Chronic diarrhea   . Colitis   . COPD (chronic obstructive pulmonary disease) ([place])   . Dementia   . Depression   . GERD (gastroesophageal reflux disease)   . Hiatal hernia   . Hx of bronchitis   . Hx of pulmonary edema JULY 2015  . Hx of pulmonary edema JULY 2015  . Hx of septic shock JULY 2015  . Hydronephrosis, left   . Hyperlipidemia   . Memory difficulties   . Microscopic colitis   . Myocardial infarction [facility])    unknown time   . Renal insufficiency   . Seizures ([place])    last seizure was 4-5 years ago with "brain bleed"  . Shortness of breath    with exertion  . Stroke [facility])    TIA four years ago / stroke NOV 2014    Past Surgical History:  Procedure Laterality Date  . APPENDECTOMY    . BREAST BIOPSY Right 02/02/2014   Procedure: RIGHT BREAST BIOPSY;  Surgeon: Jamesetta So, MD;  Location: [facility] ORS;  Service: General;  Laterality: Right;  . COLONOSCOPY N/A 05/24/2013   Procedure: COLONOSCOPY;  Surgeon: Rogene Houston, MD;  Location: [facility] ENDO SUITE;  Service: Endoscopy;   Laterality: N/A;  100  . CYSTOSCOPY W/ URETERAL STENT PLACEMENT Left 08/06/2015   Procedure: CYSTOSCOPY WITH LEFT URETERAL STENT EXCHANGE - Sammie Bench;  Surgeon: Franchot Gallo, MD;  Location: [facility] ORS;  Service: Urology;  Laterality: Left;  . CYSTOSCOPY W/ URETERAL STENT PLACEMENT Left 07/07/2016   Procedure: CYSTOSCOPY, LEFT RETROGRADE PYELOGRAM WITH LEFT URETERAL STENT REPLACEMENT;  Surgeon: Franchot Gallo, MD;  Location: [facility] ORS;  Service: Urology;  Laterality: Left;  . CYSTOSCOPY WITH RETROGRADE PYELOGRAM, URETEROSCOPY AND STENT PLACEMENT Left 09/18/2013   Procedure: CYSTOSCOPY WITH RETROGRADE PYELOGRAM,  URETEROSCOPY AND  STENT PLACEMENT, removal of nephrostomy tube;  Surgeon: Jorja Loa, MD;  Location: [facility] ORS;  Service: Urology;  Laterality: Left;  . CYSTOSCOPY WITH STENT PLACEMENT Left 09/04/2014   Procedure: CYSTOSCOPY WITH LEFT J2 STENT EXCHANGE;  Surgeon: Franchot Gallo, MD;  Location: [facility] ORS;  Service: Urology;  Laterality: Left;  . ENDOVASCULAR REPAIR OF POPLITEAL ARTERY ANEURYSM  02/05/2016  . ESOPHAGOGASTRODUODENOSCOPY N/A 05/24/2013   Procedure: ESOPHAGOGASTRODUODENOSCOPY (EGD);  Surgeon: Rogene Houston, MD;  Location: [facility] ENDO SUITE;  Service: Endoscopy;  Laterality: N/A;  . FALSE ANEURYSM REPAIR Right 02/05/2016   Procedure: REPAIR RIGHT POLITEAL  ANEURYSM;  Surgeon: Serafina Mitchell, MD;  Location: [place];  Service: Vascular;  Laterality: Right;  . KNEE ARTHROSCOPY Right   . SKIN CANCER EXCISION    . TONSILLECTOMY    . TUBAL LIGATION      There were no vitals filed for this visit.    [facility] PT Assessment - 01/26/17 0001      Assessment   Medical Diagnosis  Non healing leg and arm wound     Referring Provider  John Z hall    Onset Date/Surgical Date  01/05/17    Prior Therapy  none      Precautions   Precautions  Fall      Restrictions   Weight Bearing Restrictions  No      Prior Function   Level of Independence  Independent    Vocation  Retired       Wound Therapy - 01/26/17 1614    Subjective  Bridget Ball's daughter states that approximately three weeks ago Bridget Ball was putting lotion on her Lt leg and broke the skin with her nails.  The daughter has been trying to take care of it but the wound keeps getting bigger therefore she took her to the MD who referred her to physical therapy.  The MD noted that she had a bandage on her Lt arm as well.  This was from a dog scratch but appears to be healing better than her leg.  Dr. Nevada Crane requested Bridget Ball to be seen for both her leg and her arm wounds.     Patient and Family Stated Goals  less pain and for the wounds to be healed     Date of Onset  01/05/17    Pain Assessment  No/denies pain    Pain Score  10-Worst pain ever    Pain Type  Acute pain    Pain Location  Leg    Pain Orientation  Left;Anterior    Pain Descriptors / Indicators  Burning    Patients Stated Pain Goal  0    Evaluation and Treatment Procedures Explained to Patient/Family  Yes    Evaluation and Treatment Procedures  agreed to    Wound Properties Date First Assessed: 01/26/17 Time First Assessed: 1527 Wound Type: Laceration Location: Leg Location Orientation: Left;Anterior Wound Description (Comments): on  shin Present on Admission: Yes   Dressing Type  Gauze (Comment)    Dressing Changed  Changed    Dressing Status  Old drainage    Dressing Change Frequency  PRN    Site / Wound Assessment  Dusky    % Wound base Red or Granulating  30%    % Wound base Other/Granulation Tissue (Comment)  70%    Peri-wound Assessment  Intact above the wound there is a deep purple bruise    Wound Length (cm)  7.5 cm    Wound Width (cm)  4 cm    Wound Depth (cm)  0.1 cm    Wound Volume (cm^3)  3 cm^3    Wound Surface Area (cm^2)  30 cm^2    Drainage Amount  Minimal    Drainage Description  Serosanguineous    Treatment  Cleansed;Debridement (Selective);Other (Comment)    Wound Properties Date First Assessed: 01/26/17 Time First  Assessed: 1539 Wound Type: Laceration Location: Arm Location Orientation: Left Wound Description (Comments): Lt arm 2 wounds one 1.0x 1.5 the second 1.2  x 1.5 cm  Present on Admission: Yes   Dressing Type  -- see above     % Wound base Red or Granulating  80%    % Wound base Yellow/Fibrinous Exudate  20%    Wound Length (cm)  1.2 cm second wound is right next to this wound 1 x 1.5     Wound Width (cm)  1.5 cm    Wound Surface Area (cm^2)  1.8 cm^2    Drainage Amount  Scant    Drainage Description  Serous    Treatment  Cleansed;Debridement (Selective);Other (Comment) Dressing     Selective Debridement - Location  -- all wounds     Selective Debridement - Tools Used  Forceps    Selective Debridement - Tissue Removed  -- slough and biofilm    Wound Therapy - Clinical Statement  Ms. Meares is a n 84 yo female who has a history of nonhealing wounds.  She does not eat and is malnurished with poor venous return.  She scratched her Lt leg while putting lotion on and at this point the wound continues to get bigger.  At approximately the same time her dog scratched her Lt arm causing two wounds.  She is currently being referred to skilled physical therapy to create a healing environment to allow all of her wounds to heal.      Wound Therapy - Functional Problem List  difficulty walking due to pain     Factors Delaying/Impairing Wound Healing  Altered sensation;Incontinence;Multiple medical problems;Polypharmacy;Vascular compromise    Hydrotherapy Plan  Debridement;Dressing change    Wound Therapy - Frequency  2X / week for 4 weeks     Wound Therapy - Current Recommendations  PT    Wound Plan  cleanse, moisturize, debride  and compresion dressing .    Dressing   Lt LE :  medihoney  followed by profore.    Dressing  Lt UE: medihoney, gauze and coban.              Objective measurements completed on examination: See above findings.            PT Education - 01/26/17 1632    Education  provided  Yes    Education Details  do not get dressing wet; take dressing off if it becomes to tight or painful.     Person(s) Educated  Child(ren);Patient    Methods  Explanation    Comprehension  Verbalized understanding       PT Short Term Goals - 01/26/17 1635      PT SHORT TERM GOAL #1   Title  PT wound to be 80% granulated to reduce risk of cellulitis     Time  2    Period  Weeks    Status  New    Target Date  02/09/17      PT SHORT TERM GOAL #2   Title  PT and daughter to be able to verbalize the risk of cellulitis; the signs and symptoms and the need to seek medical attention     Time  2    Period  Weeks    Status  New      PT SHORT TERM GOAL #3   Title  Lt Leg wound size to be no greater than 5x3cm     Time  2    Period  Weeks    Status  New      PT SHORT TERM GOAL #4   Title  Smaller  of Lt arm wound to be healed     Time  2    Period  Weeks    Status  New        PT Long Term Goals - 01/26/17 1636      PT LONG TERM GOAL #1   Title  Pt wound to be 100% granulated to reduce risk for cellulitis     Time  4    Period  Weeks    Status  New    Target Date  02/23/17      PT LONG TERM GOAL #2   Title  Pt LT leg wound to be decreased in size to no greater than 2 x 2 cm to allow daughter to feel comfortable is self care for discharge.     Time  4    Period  Weeks    Status  New      PT LONG TERM GOAL #3   Title  PT Pain level with Left leg to be no greater than a 3/10 to allow pt to be able to walk for 5 minutes without increased pain.     Time  4    Period  Days    Status  New      PT LONG TERM GOAL #4   Title  PT Larger Lt arm wound to be healed     Time  4    Period  Weeks    Status  New           Plan - 01/26/17 1634    Clinical Impression Statement  see above     Clinical Presentation  Stable    Clinical Decision Making  Low    Rehab Potential  Good    PT Frequency  2x / week    PT Duration  4 weeks    PT Treatment/Interventions   Other (comment);Compression bandaging debridement, cleansing and dressing change     PT Next Visit Plan  as above        Patient will benefit from skilled therapeutic intervention in order to improve the following deficits and impairments:  Pain, Other (comment)(non-healing wound )  Visit Diagnosis: Pain in left lower leg  Unspecified open wound, left lower leg, sequela  Open wound of left forearm with complication, sequela    Problem List Patient Active Problem List   Diagnosis Date Noted  . History of stroke 10/20/2016  . TIA (transient ischemic attack) 10/12/2016  . Acute urinary retention 10/12/2016  . Popliteal artery aneurysm ([place]) 02/05/2016  . Malnutrition of moderate degree 12/15/2015  . DVT of popliteal vein ([place]) 12/14/2015  . Aneurysm of left internal iliac artery ([place]) 12/14/2015  . Sepsis affecting skin ([place]) 12/13/2015  . Dementia 12/13/2015  . GERD (gastroesophageal reflux disease) 12/13/2015  . Severe protein-calorie malnutrition ([place]) 12/13/2015  . Cellulitis of right leg 11/24/2015  . Dyslipidemia 11/24/2015  . Erythema of lower extremity 11/24/2015  . Anemia 11/01/2015  . Esophageal reflux 08/17/2013  . COPD (chronic obstructive pulmonary disease) ([place]) 08/11/2013  . NSTEMI (non-ST elevated myocardial infarction) type 2 07/26/2013  . Dehydration 07/25/2013  . AKI (acute kidney injury) ([place]) 07/25/2013  . Colitis 07/08/2013  . Hydronephrosis, left 07/08/2013  . Microscopic colitis 06/27/2013  . Memory loss 04/25/2013  . Depression 04/25/2013  . SAH (subarachnoid hemorrhage) ([place]) 11/22/2012  . Seizure disorder secondary to prior [facility] 11/22/2012   Rayetta Humphrey, PT CLT [phone] 01/26/2017, 4:39 PM  [place], [place], [address] Phone: [phone]   Fax:  [phone]  Name: CLOVIA REINE MRN: [id number] Date of Birth: August 12, 1928

## 2017-01-27 ENCOUNTER — Encounter (HOSPITAL_COMMUNITY): Payer: Self-pay | Admitting: Occupational Therapy

## 2017-01-27 ENCOUNTER — Ambulatory Visit (HOSPITAL_COMMUNITY): Payer: Medicare Other | Admitting: Occupational Therapy

## 2017-01-27 DIAGNOSIS — M79662 Pain in left lower leg: Secondary | ICD-10-CM | POA: Diagnosis not present

## 2017-01-27 DIAGNOSIS — M25611 Stiffness of right shoulder, not elsewhere classified: Secondary | ICD-10-CM | POA: Diagnosis not present

## 2017-01-27 DIAGNOSIS — J449 Chronic obstructive pulmonary disease, unspecified: Secondary | ICD-10-CM | POA: Diagnosis not present

## 2017-01-27 DIAGNOSIS — M25512 Pain in left shoulder: Secondary | ICD-10-CM | POA: Diagnosis not present

## 2017-01-27 DIAGNOSIS — R29898 Other symptoms and signs involving the musculoskeletal system: Secondary | ICD-10-CM | POA: Diagnosis not present

## 2017-01-27 DIAGNOSIS — M25612 Stiffness of left shoulder, not elsewhere classified: Secondary | ICD-10-CM | POA: Diagnosis not present

## 2017-01-27 DIAGNOSIS — S51802S Unspecified open wound of left forearm, sequela: Secondary | ICD-10-CM | POA: Diagnosis not present

## 2017-01-27 DIAGNOSIS — M25511 Pain in right shoulder: Secondary | ICD-10-CM | POA: Diagnosis not present

## 2017-01-27 DIAGNOSIS — S81802S Unspecified open wound, left lower leg, sequela: Secondary | ICD-10-CM | POA: Diagnosis not present

## 2017-01-27 NOTE — Therapy (Addendum)
[place], [place], [address] Phone: [phone]   Fax:  [phone]  Occupational Therapy Treatment  Patient Details  Name: Bridget Ball MRN: [id number] Date of Birth: 12-Jul-1928 Referring Provider: Edwinna Areola hall   Encounter Date: 01/27/2017  OT End of Session - 01/27/17 1458    Visit Number  2    Number of Visits  12    Date for OT Re-Evaluation  03/05/17    Authorization Type  [employer] medicare    OT Start Time  1305    OT Stop Time  1345    OT Time Calculation (min)  40 min    Activity Tolerance  Patient tolerated treatment well    Behavior During Therapy  [facility] for tasks assessed/performed       Past Medical History:  Diagnosis Date  . Anxiety   . Arthritis   . Attention to nephrostomy ([place])    PT HAS NEPHROSTOMY TUBE IN PLACE  . Bruises easily   . Cancer [facility])    Skin cancer  . Chronic diarrhea   . Colitis   . COPD (chronic obstructive pulmonary disease) ([place])   . Dementia   . Depression   . GERD (gastroesophageal reflux disease)   . Hiatal hernia   . Hx of bronchitis   . Hx of pulmonary edema JULY 2015  . Hx of pulmonary edema JULY 2015  . Hx of septic shock JULY 2015  . Hydronephrosis, left   . Hyperlipidemia   . Memory difficulties   . Microscopic colitis   . Myocardial infarction [facility])    unknown time   . Renal insufficiency   . Seizures ([place])    last seizure was 4-5 years ago with "brain bleed"  . Shortness of breath    with exertion  . Stroke [facility])    TIA four years ago / stroke NOV 2014    Past Surgical History:  Procedure Laterality Date  . APPENDECTOMY    . BREAST BIOPSY Right 02/02/2014   Procedure: RIGHT BREAST BIOPSY;  Surgeon: Jamesetta So, MD;  Location: [facility] ORS;  Service: General;  Laterality: Right;  . COLONOSCOPY N/A 05/24/2013   Procedure: COLONOSCOPY;  Surgeon: Rogene Houston, MD;  Location: [facility] ENDO SUITE;  Service: Endoscopy;  Laterality: N/A;  100  . CYSTOSCOPY W/  URETERAL STENT PLACEMENT Left 08/06/2015   Procedure: CYSTOSCOPY WITH LEFT URETERAL STENT EXCHANGE - Sammie Bench;  Surgeon: Franchot Gallo, MD;  Location: [facility] ORS;  Service: Urology;  Laterality: Left;  . CYSTOSCOPY W/ URETERAL STENT PLACEMENT Left 07/07/2016   Procedure: CYSTOSCOPY, LEFT RETROGRADE PYELOGRAM WITH LEFT URETERAL STENT REPLACEMENT;  Surgeon: Franchot Gallo, MD;  Location: [facility] ORS;  Service: Urology;  Laterality: Left;  . CYSTOSCOPY WITH RETROGRADE PYELOGRAM, URETEROSCOPY AND STENT PLACEMENT Left 09/18/2013   Procedure: CYSTOSCOPY WITH RETROGRADE PYELOGRAM,  URETEROSCOPY AND  STENT PLACEMENT, removal of nephrostomy tube;  Surgeon: Jorja Loa, MD;  Location: [facility] ORS;  Service: Urology;  Laterality: Left;  . CYSTOSCOPY WITH STENT PLACEMENT Left 09/04/2014   Procedure: CYSTOSCOPY WITH LEFT J2 STENT EXCHANGE;  Surgeon: Franchot Gallo, MD;  Location: [facility] ORS;  Service: Urology;  Laterality: Left;  . ENDOVASCULAR REPAIR OF POPLITEAL ARTERY ANEURYSM  02/05/2016  . ESOPHAGOGASTRODUODENOSCOPY N/A 05/24/2013   Procedure: ESOPHAGOGASTRODUODENOSCOPY (EGD);  Surgeon: Rogene Houston, MD;  Location: [facility];  Service: Endoscopy;  Laterality: N/A;  . FALSE ANEURYSM REPAIR Right 02/05/2016   Procedure: REPAIR RIGHT POLITEAL  ANEURYSM;  Surgeon: Serafina Mitchell, MD;  Location: [place];  Service: Vascular;  Laterality: Right;  . KNEE ARTHROSCOPY Right   . SKIN CANCER EXCISION    . TONSILLECTOMY    . TUBAL LIGATION      There were no vitals filed for this visit.  Subjective Assessment - 01/27/17 1312    Subjective   S: It's not comfortable but it's not hurting.     Currently in Pain?  No/denies         [facility] OT Assessment - 01/27/17 1312      Assessment   Medical Diagnosis  Bilateral Shoulder pain      Precautions   Precautions  Fall      Restrictions   Weight Bearing Restrictions  No              OT Treatments/Exercises (OP) - 01/27/17 1325      Exercises    Exercises  Shoulder      Shoulder Exercises: Supine   Protraction  PROM;5 reps;AAROM;10 reps    Horizontal ABduction  PROM;5 reps;AAROM;10 reps    External Rotation  PROM;5 reps;AROM;10 reps    Internal Rotation  PROM;5 reps;AROM;10 reps    Flexion  PROM;5 reps;AAROM;10 reps    ABduction  PROM;5 reps      Shoulder Exercises: Seated   Elevation  AROM;10 reps    Extension  AROM;10 reps    Row  AROM;10 reps      Shoulder Exercises: ROM/Strengthening   Wall Wash  1' each arm      Manual Therapy   Manual Therapy  Myofascial release    Manual therapy comments  completed separately from therapeutic exercises    Myofascial Release  Myofascial release to right upper arm, bilateral trapezius and scapularis regions to decrease pain and fascial restrictions and increase joint range of motion               OT Short Term Goals - 01/27/17 1501      OT SHORT TERM GOAL #1   Title  Patient will be educated and independent with HEP in order to faciliate progress in therapy and allow her to use her BUE's with increased comfort during daily tasks.     Time  3    Period  Weeks    Status  On-going      OT SHORT TERM GOAL #2   Title  Patient will increase BUE P/ROM to [facility] to increase ability to complete dressing tasks with less difficulty.     Time  3    Period  Weeks    Status  On-going      OT SHORT TERM GOAL #3   Title  Patient will increase BUE strength to 4-/5 to increase ability to complete normal household tasks with greater strength and ease.     Time  3    Period  Weeks    Status  On-going      OT SHORT TERM GOAL #4   Title  Patient will report a decreased pain level of 5/10 in BUE's while completing daily tasks.     Time  3    Period  Weeks    Status  On-going        OT Long Term Goals - 01/27/17 1501      OT LONG TERM GOAL #1   Title  Patient will return to highest level of independence while completing all daily tasks using BUE's with reports of decreased  pain of  3/10 or less.     Time  6    Period  Weeks    Status  On-going      OT LONG TERM GOAL #2   Title  Patient will increase A/ROM of RUE to [facility] to increase ability to return to using right arm as dominant extremity.     Time  6    Period  Weeks    Status  On-going      OT LONG TERM GOAL #3   Title  Patient will increase BUE strength to 4/10 overall in order to complete normal household tasks with less difficulty.     Time  6    Period  Weeks    Status  On-going      OT LONG TERM GOAL #4   Title  Patient will decrease fasical restrictions in min amount or less in order to increase functional mobility needed for reaching overhead.     Time  6    Period  Weeks    Status  On-going            Plan - 01/27/17 1458    Clinical Impression Statement  A: Initiated myofascial release, manual therapy, P/ROM, and AA/ROM this session. Pt reporting weakness and pain in RUE, rest breaks provided as needed during exercises. PTA checked pt wound on arm due to pt complaints of pulling and sliding, no problems noted with bandage and pt will be seen tomorrow for wound care session. Verbal cuing for form and technique required during session.     Plan  P: Continue with AA/ROM RUE and attempt A/ROM for LUE. Add ball stretches    Consulted and Agree with Plan of Care  Patient       Patient will benefit from skilled therapeutic intervention in order to improve the following deficits and impairments:  Pain, Decreased range of motion, Increased fascial restrictions, Impaired UE functional use, Decreased strength  Visit Diagnosis: Other symptoms and signs involving the musculoskeletal system  Acute pain of left shoulder  Acute pain of right shoulder  Stiffness of right shoulder, not elsewhere classified  Stiffness of left shoulder, not elsewhere classified    Problem List Patient Active Problem List   Diagnosis Date Noted  . History of stroke 10/20/2016  . TIA (transient ischemic attack)  10/12/2016  . Acute urinary retention 10/12/2016  . Popliteal artery aneurysm ([place]) 02/05/2016  . Malnutrition of moderate degree 12/15/2015  . DVT of popliteal vein ([place]) 12/14/2015  . Aneurysm of left internal iliac artery ([place]) 12/14/2015  . Sepsis affecting skin ([place]) 12/13/2015  . Dementia 12/13/2015  . GERD (gastroesophageal reflux disease) 12/13/2015  . Severe protein-calorie malnutrition ([place]) 12/13/2015  . Cellulitis of right leg 11/24/2015  . Dyslipidemia 11/24/2015  . Erythema of lower extremity 11/24/2015  . Anemia 11/01/2015  . Esophageal reflux 08/17/2013  . COPD (chronic obstructive pulmonary disease) ([place]) 08/11/2013  . NSTEMI (non-ST elevated myocardial infarction) type 2 07/26/2013  . Dehydration 07/25/2013  . AKI (acute kidney injury) ([place]) 07/25/2013  . Colitis 07/08/2013  . Hydronephrosis, left 07/08/2013  . Microscopic colitis 06/27/2013  . Memory loss 04/25/2013  . Depression 04/25/2013  . SAH (subarachnoid hemorrhage) ([place]) 11/22/2012  . Seizure disorder secondary to prior [facility] 11/22/2012   Guadelupe Sabin, OTR/L  [phone] 01/27/2017, 3:02 PM  [place] [address] [place], [place], [address] Phone: [phone]   Fax:  [phone]  Name: Bridget Ball MRN:  [id number] Date of Birth: Nov 30, 1928

## 2017-01-28 ENCOUNTER — Ambulatory Visit (HOSPITAL_COMMUNITY): Payer: Medicare Other | Admitting: Physical Therapy

## 2017-01-28 ENCOUNTER — Other Ambulatory Visit: Payer: Self-pay

## 2017-01-28 DIAGNOSIS — M25511 Pain in right shoulder: Secondary | ICD-10-CM | POA: Diagnosis not present

## 2017-01-28 DIAGNOSIS — S81802S Unspecified open wound, left lower leg, sequela: Secondary | ICD-10-CM | POA: Diagnosis not present

## 2017-01-28 DIAGNOSIS — M25512 Pain in left shoulder: Secondary | ICD-10-CM | POA: Diagnosis not present

## 2017-01-28 DIAGNOSIS — M25612 Stiffness of left shoulder, not elsewhere classified: Secondary | ICD-10-CM | POA: Diagnosis not present

## 2017-01-28 DIAGNOSIS — M79662 Pain in left lower leg: Secondary | ICD-10-CM | POA: Diagnosis not present

## 2017-01-28 DIAGNOSIS — S51802S Unspecified open wound of left forearm, sequela: Secondary | ICD-10-CM

## 2017-01-28 DIAGNOSIS — R29898 Other symptoms and signs involving the musculoskeletal system: Secondary | ICD-10-CM | POA: Diagnosis not present

## 2017-01-28 DIAGNOSIS — M25611 Stiffness of right shoulder, not elsewhere classified: Secondary | ICD-10-CM | POA: Diagnosis not present

## 2017-01-28 NOTE — Therapy (Signed)
[place], [place], [address] Phone: [phone]   Fax:  [phone]  Wound Care Therapy  Patient Details  Name: Bridget Ball MRN: [id number] Date of Birth: June 27, 1928 Referring Provider: Edwinna Areola hall   Encounter Date: 01/28/2017  PT End of Session - 01/28/17 1222    Visit Number  2    Number of Visits  8    Date for PT Re-Evaluation  02/25/17    Authorization Type  [employer] medicare    Authorization - Visit Number  2    Authorization - Number of Visits  8    PT Start Time  6962    PT Stop Time  1205    PT Time Calculation (min)  50 min    Activity Tolerance  Patient limited by pain    Behavior During Therapy  Anxious       Past Medical History:  Diagnosis Date  . Anxiety   . Arthritis   . Attention to nephrostomy ([place])    PT HAS NEPHROSTOMY TUBE IN PLACE  . Bruises easily   . Cancer [facility])    Skin cancer  . Chronic diarrhea   . Colitis   . COPD (chronic obstructive pulmonary disease) ([place])   . Dementia   . Depression   . GERD (gastroesophageal reflux disease)   . Hiatal hernia   . Hx of bronchitis   . Hx of pulmonary edema JULY 2015  . Hx of pulmonary edema JULY 2015  . Hx of septic shock JULY 2015  . Hydronephrosis, left   . Hyperlipidemia   . Memory difficulties   . Microscopic colitis   . Myocardial infarction [facility])    unknown time   . Renal insufficiency   . Seizures ([place])    last seizure was 4-5 years ago with "brain bleed"  . Shortness of breath    with exertion  . Stroke [facility])    TIA four years ago / stroke NOV 2014    Past Surgical History:  Procedure Laterality Date  . APPENDECTOMY    . BREAST BIOPSY Right 02/02/2014   Procedure: RIGHT BREAST BIOPSY;  Surgeon: Jamesetta So, MD;  Location: [facility] ORS;  Service: General;  Laterality: Right;  . COLONOSCOPY N/A 05/24/2013   Procedure: COLONOSCOPY;  Surgeon: Rogene Houston, MD;  Location: [facility];  Service: Endoscopy;   Laterality: N/A;  100  . CYSTOSCOPY W/ URETERAL STENT PLACEMENT Left 08/06/2015   Procedure: CYSTOSCOPY WITH LEFT URETERAL STENT EXCHANGE - Sammie Bench;  Surgeon: Franchot Gallo, MD;  Location: [facility];  Service: Urology;  Laterality: Left;  . CYSTOSCOPY W/ URETERAL STENT PLACEMENT Left 07/07/2016   Procedure: CYSTOSCOPY, LEFT RETROGRADE PYELOGRAM WITH LEFT URETERAL STENT REPLACEMENT;  Surgeon: Franchot Gallo, MD;  Location: [facility];  Service: Urology;  Laterality: Left;  . CYSTOSCOPY WITH RETROGRADE PYELOGRAM, URETEROSCOPY AND STENT PLACEMENT Left 09/18/2013   Procedure: CYSTOSCOPY WITH RETROGRADE PYELOGRAM,  URETEROSCOPY AND  STENT PLACEMENT, removal of nephrostomy tube;  Surgeon: Jorja Loa, MD;  [facility]: [facility];  Service: Urology;  Laterality: Left;  . CYSTOSCOPY WITH STENT PLACEMENT Left 09/04/2014   Procedure: CYSTOSCOPY WITH LEFT J2 STENT EXCHANGE;  Surgeon: Franchot Gallo, MD;  Location: [facility];  Service: Urology;  Laterality: Left;  . ENDOVASCULAR REPAIR OF POPLITEAL ARTERY ANEURYSM  02/05/2016  . ESOPHAGOGASTRODUODENOSCOPY N/A 05/24/2013   Procedure: ESOPHAGOGASTRODUODENOSCOPY (EGD);  Surgeon: Rogene Houston, MD;  Location[facility];  Service: Endoscopy;  Laterality: N/A;  . FALSE ANEURYSM REPAIR Right 02/05/2016   Procedure: REPAIR RIGHT POLITEAL  ANEURYSM;  Surgeon: Serafina Mitchell, MD;  Location: [place];  Service: Vascular;  Laterality: Right;  . KNEE ARTHROSCOPY Right   . SKIN CANCER EXCISION    . TONSILLECTOMY    . TUBAL LIGATION      There were no vitals filed for this visit.              Wound Therapy - 01/28/17 1213    Subjective  Ms. Kocher has no complaints she is very anxious about the treatment    Patient and Family Stated Goals  less pain and for the wounds to be healed     Date of Onset  01/05/17    Pain Assessment  0-10    Pain Score  10-Worst pain ever with debridement    Pain Type  Acute pain    Pain Location  Leg    Pain Orientation   Left;Anterior    Patients Stated Pain Goal  0    Evaluation and Treatment Procedures Explained to Patient/Family  Yes    Evaluation and Treatment Procedures  agreed to    Wound Properties Date First Assessed: 01/26/17 Time First Assessed: 1527 Wound Type: Laceration Location: Leg Location Orientation: Left;Anterior Wound Description (Comments): on  shin Present on Admission: Yes   Dressing Type  Compression wrap    Dressing Changed  Changed    Dressing Status  Old drainage    Dressing Change Frequency  PRN    Site / Wound Assessment  Bleeding;Granulation Ball;Painful;Yellow    % Wound base Red or Granulating  55%    % Wound base Other/Granulation Ball (Comment)  45%    Peri-wound Assessment  Intact above the wound there is a deep purple bruise    Drainage Amount  Minimal    Drainage Description  Serosanguineous    Treatment  Cleansed;Debridement (Selective)    Wound Properties Date First Assessed: 01/26/17 Time First Assessed: 1539 Wound Type: Laceration Location: Arm Location Orientation: Left Wound Description (Comments): Lt arm 2 wounds one 1.0x 1.5 the second 1.2 x 1.5 cm  Present on Admission: Yes   Dressing Type  Gauze (Comment) see above     Dressing Changed  Changed    Dressing Change Frequency  PRN    Site / Wound Assessment  Bleeding;Red    % Wound base Red or Granulating  80%    % Wound base Yellow/Fibrinous Exudate  20%    Drainage Amount  Minimal    Drainage Description  Serosanguineous    Treatment  Cleansed;Debridement (Selective)    Selective Debridement - Location  all wounds  all wounds     Selective Debridement - Tools Used  Forceps;Scalpel;Scissors    Selective Debridement - Ball Removed  slough and biofilm    Wound Therapy - Clinical Statement  All wounds with improved granulation today.  Biofilm remains on Lt LE wound.      Wound Therapy - Functional Problem List  difficulty walking due to pain     Factors Delaying/Impairing Wound Healing  Altered  sensation;Incontinence;Multiple medical problems;Polypharmacy;Vascular compromise    Hydrotherapy Plan  Debridement;Dressing change    Wound Therapy - Frequency  2X / week for 4 weeks     Wound Therapy - Current Recommendations  PT    Wound Plan  Measure wounds on Tuesdays; cleanse, moisturize, debride  and compresion dressing .    Dressing   Lt LE :  medihoney  followed by profore.    Dressing  Lt UE: medihoney, gauze and coban.                 PT Short Term Goals - 01/28/17 1223      PT SHORT TERM GOAL #1   Title  PT wound to be 80% granulated to reduce risk of cellulitis     Time  2    Period  Weeks    Status  On-going      PT SHORT TERM GOAL #2   Title  PT and daughter to be able to verbalize the risk of cellulitis; the signs and symptoms and the need to seek medical attention     Time  2    Period  Weeks    Status  On-going      PT SHORT TERM GOAL #3   Title  Lt Leg wound size to be no greater than 5x3cm     Time  2    Period  Weeks    Status  On-going      PT SHORT TERM GOAL #4   Title  Smaller of Lt arm wound to be healed     Time  2    Period  Weeks    Status  On-going        PT Long Term Goals - 01/28/17 1223      PT LONG TERM GOAL #1   Title  Pt wound to be 100% granulated to reduce risk for cellulitis     Time  4    Period  Weeks    Status  On-going      PT LONG TERM GOAL #2   Title  Pt LT leg wound to be decreased in size to no greater than 2 x 2 cm to allow daughter to feel comfortable is self care for discharge.     Time  4    Period  Weeks    Status  On-going      PT LONG TERM GOAL #3   Title  PT Pain level with Left leg to be no greater than a 3/10 to allow pt to be able to walk for 5 minutes without increased pain.     Time  4    Period  Days    Status  On-going      PT LONG TERM GOAL #4   Title  PT Larger Lt arm wound to be healed     Time  4    Period  Weeks    Status  On-going            Plan - 01/28/17 1222     Clinical Impression Statement  see above     Rehab Potential  Good    PT Frequency  2x / week    PT Duration  4 weeks    PT Treatment/Interventions  Other (comment);Compression bandaging debridement, cleansing and dressing change     PT Next Visit Plan  as above        Patient will benefit from skilled therapeutic intervention in order to improve the following deficits and impairments:  Pain, Other (comment)(non-healing wound )  Visit Diagnosis: Open wound of left forearm with complication, sequela  Pain in left lower leg  Unspecified open wound, left lower leg, sequela     Problem List Patient Active Problem List   Diagnosis Date Noted  . History of stroke 10/20/2016  . TIA (transient ischemic attack) 10/12/2016  .  Acute urinary retention 10/12/2016  . Popliteal artery aneurysm ([place]) 02/05/2016  . Malnutrition of moderate degree 12/15/2015  . DVT of popliteal vein ([place]) 12/14/2015  . Aneurysm of left internal iliac artery ([place]) 12/14/2015  . Sepsis affecting skin ([place]) 12/13/2015  . Dementia 12/13/2015  . GERD (gastroesophageal reflux disease) 12/13/2015  . Severe protein-calorie malnutrition ([place]) 12/13/2015  . Cellulitis of right leg 11/24/2015  . Dyslipidemia 11/24/2015  . Erythema of lower extremity 11/24/2015  . Anemia 11/01/2015  . Esophageal reflux 08/17/2013  . COPD (chronic obstructive pulmonary disease) ([place]) 08/11/2013  . NSTEMI (non-ST elevated myocardial infarction) type 2 07/26/2013  . Dehydration 07/25/2013  . AKI (acute kidney injury) ([place]) 07/25/2013  . Colitis 07/08/2013  . Hydronephrosis, left 07/08/2013  . Microscopic colitis 06/27/2013  . Memory loss 04/25/2013  . Depression 04/25/2013  . SAH (subarachnoid hemorrhage) ([place]) 11/22/2012  . Seizure disorder secondary to prior [facility] 11/22/2012    Rayetta Humphrey, PT CLT [phone] 01/28/2017, 12:26 PM  [place] [address] [place], [place],  [address] Phone: [phone]   Fax:  [phone]  Name: SILVIE OBREMSKI MRN: [id number] Date of Birth: Feb 08, 1928

## 2017-01-29 ENCOUNTER — Ambulatory Visit (HOSPITAL_COMMUNITY): Payer: Medicare Other | Admitting: Specialist

## 2017-01-29 ENCOUNTER — Telehealth (HOSPITAL_COMMUNITY): Payer: Self-pay | Admitting: Internal Medicine

## 2017-01-29 NOTE — Telephone Encounter (Signed)
01/29/17  daughter called and cx said her mom's leg was really hurting from yesterday and just not able to have therapy on her arm today

## 2017-02-01 ENCOUNTER — Other Ambulatory Visit: Payer: Self-pay

## 2017-02-01 ENCOUNTER — Telehealth (HOSPITAL_COMMUNITY): Payer: Self-pay | Admitting: Internal Medicine

## 2017-02-01 ENCOUNTER — Ambulatory Visit (HOSPITAL_COMMUNITY): Payer: Medicare Other

## 2017-02-01 ENCOUNTER — Ambulatory Visit (HOSPITAL_COMMUNITY): Payer: Medicare Other | Admitting: Physical Therapy

## 2017-02-01 ENCOUNTER — Encounter (HOSPITAL_COMMUNITY): Payer: Self-pay

## 2017-02-01 DIAGNOSIS — M25511 Pain in right shoulder: Secondary | ICD-10-CM

## 2017-02-01 DIAGNOSIS — M25611 Stiffness of right shoulder, not elsewhere classified: Secondary | ICD-10-CM | POA: Diagnosis not present

## 2017-02-01 DIAGNOSIS — S81802S Unspecified open wound, left lower leg, sequela: Secondary | ICD-10-CM

## 2017-02-01 DIAGNOSIS — M25512 Pain in left shoulder: Secondary | ICD-10-CM

## 2017-02-01 DIAGNOSIS — M25612 Stiffness of left shoulder, not elsewhere classified: Secondary | ICD-10-CM

## 2017-02-01 DIAGNOSIS — S51802S Unspecified open wound of left forearm, sequela: Secondary | ICD-10-CM | POA: Diagnosis not present

## 2017-02-01 DIAGNOSIS — R29898 Other symptoms and signs involving the musculoskeletal system: Secondary | ICD-10-CM | POA: Diagnosis not present

## 2017-02-01 DIAGNOSIS — M79662 Pain in left lower leg: Secondary | ICD-10-CM

## 2017-02-01 NOTE — Therapy (Signed)
[place], [place], [address] Phone: [phone]   Fax:  [phone]  Wound Care Therapy  Patient Details  Name: Bridget Ball MRN: [id number] Date of Birth: [birth year] Referring Provider: Edwinna Areola hall   Encounter Date: 02/01/2017  PT End of Session - 02/01/17 1454    Visit Number  3    Number of Visits  8    Date for PT Re-Evaluation  02/25/17    Authorization Type  [employer] medicare    Authorization - Visit Number  3    Authorization - Number of Visits  8    PT Start Time  1350    PT Stop Time  1440    PT Time Calculation (min)  50 min    Activity Tolerance  Patient limited by pain    Behavior During Therapy  Anxious       Past Medical History:  Diagnosis Date  . Anxiety   . Arthritis   . Attention to nephrostomy ([place])    PT HAS NEPHROSTOMY TUBE IN PLACE  . Bruises easily   . Cancer [facility])    Skin cancer  . Chronic diarrhea   . Colitis   . COPD (chronic obstructive pulmonary disease) ([place])   . Dementia   . Depression   . GERD (gastroesophageal reflux disease)   . Hiatal hernia   . Hx of bronchitis   . Hx of pulmonary edema JULY 2015  . Hx of pulmonary edema JULY 2015  . Hx of septic shock JULY 2015  . Hydronephrosis, left   . Hyperlipidemia   . Memory difficulties   . Microscopic colitis   . Myocardial infarction [facility])    unknown time   . Renal insufficiency   . Seizures ([place])    last seizure was 4-5 years ago with "brain bleed"  . Shortness of breath    with exertion  . Stroke [facility])    TIA four years ago / stroke NOV 2014    Past Surgical History:  Procedure Laterality Date  . APPENDECTOMY    . BREAST BIOPSY Right 02/02/2014   Procedure: RIGHT BREAST BIOPSY;  Surgeon: Jamesetta So, MD;  Location: [facility];  Service: General;  Laterality: Right;  . COLONOSCOPY N/A 05/24/2013   Procedure: COLONOSCOPY;  Surgeon: Rogene Houston, MD;  [facility];  Service: Endoscopy;   Laterality: N/A;  100  . CYSTOSCOPY W/ URETERAL STENT PLACEMENT Left 08/06/2015   Procedure: CYSTOSCOPY WITH LEFT URETERAL STENT EXCHANGE - Sammie Bench;  Surgeon: Franchot Gallo, MD;  Location: [facility];  Service: Urology;  Laterality: Left;  . CYSTOSCOPY W/ URETERAL STENT PLACEMENT Left 07/07/2016   Procedure: CYSTOSCOPY, LEFT RETROGRADE PYELOGRAM WITH LEFT URETERAL STENT REPLACEMENT;  Surgeon: Franchot Gallo, MD;  Location: [facility];  Service: Urology;  Laterality: Left;  . CYSTOSCOPY WITH RETROGRADE PYELOGRAM, URETEROSCOPY AND STENT PLACEMENT Left 09/18/2013   Procedure: CYSTOSCOPY WITH RETROGRADE PYELOGRAM,  URETEROSCOPY AND  STENT PLACEMENT, removal of nephrostomy tube;  Surgeon: Jorja Loa, MD;  [facility];  Service: Urology;  Laterality: Left;  . CYSTOSCOPY WITH STENT PLACEMENT Left 09/04/2014   Procedure: CYSTOSCOPY WITH LEFT J2 STENT EXCHANGE;  Surgeon: Franchot Gallo, MD;  Location: [facility];  Service: Urology;  Laterality: Left;  . ENDOVASCULAR REPAIR OF POPLITEAL ARTERY ANEURYSM  02/05/2016  . ESOPHAGOGASTRODUODENOSCOPY N/A 05/24/2013   Procedure: ESOPHAGOGASTRODUODENOSCOPY (EGD);  Surgeon: Rogene Houston, MD;  [facility];  Service: Endoscopy;  Laterality: N/A;  . FALSE ANEURYSM REPAIR Right 02/05/2016   Procedure: REPAIR RIGHT POLITEAL  ANEURYSM;  Surgeon: Serafina Mitchell, MD;  Location: [place];  Service: Vascular;  Laterality: Right;  . KNEE ARTHROSCOPY Right   . SKIN CANCER EXCISION    . TONSILLECTOMY    . TUBAL LIGATION      There were no vitals filed for this visit.              Wound Therapy - 02/01/17 1446    Subjective  Pt states that she is very nervous due to anticipating the debridement     Patient and Family Stated Goals  less pain and for the wounds to be healed     Date of Onset  01/05/17    Pain Assessment  0-10    Pain Score  10-Worst pain ever With debridement    Pain Type  Acute pain    Pain Location  Leg    Pain Orientation   Anterior    Patients Stated Pain Goal  0    Evaluation and Treatment Procedures Explained to Patient/Family  Yes    Evaluation and Treatment Procedures  agreed to    Wound Properties Date First Assessed: 01/26/17 Time First Assessed: 1527 Wound Type: Laceration Location: Leg Location Orientation: Left;Anterior Wound Description (Comments): on  shin Present on Admission: Yes   Dressing Type  Compression wrap    Dressing Changed  Changed    Dressing Status  Old drainage    Dressing Change Frequency  PRN    Site / Wound Assessment  Bleeding;Granulation tissue;Painful;Yellow    % Wound base Red or Granulating  70%    % Wound base Other/Granulation Tissue (Comment)  30%    Peri-wound Assessment  Intact above the wound there is a deep purple bruise    Drainage Amount  Minimal    Drainage Description  Sanguineous    Treatment  Cleansed;Debridement (Selective)    Wound Properties Date First Assessed: 01/26/17 Time First Assessed: 1539 Wound Type: Laceration Location: Arm Location Orientation: Left Wound Description (Comments): Lt arm 2 wounds one 1.0x 1.5 the second 1.2 x 1.5 cm  Present on Admission: Yes   Dressing Type  Gauze (Comment) see above     Dressing Changed  Changed    Dressing Change Frequency  PRN    Site / Wound Assessment  Bleeding;Red    % Wound base Red or Granulating  90%    % Wound base Yellow/Fibrinous Exudate  10%    Drainage Amount  Minimal    Drainage Description  Serosanguineous    Treatment  Cleansed;Debridement (Selective)    Selective Debridement - Location  all wounds  all wounds     Selective Debridement - Tools Used  Forceps;Scalpel;Scissors    Selective Debridement - Tissue Removed  slough and biofilm    Wound Therapy - Clinical Statement  Wounds have increased granulation today. Leg wound at distal end appears to have some depth but this is difficult to assess due to pt high pain and anxiety.  Pt bandaging on forearm continues to slide therefore thereapist went  above the elbow with a figure 8 wrap.;  Pt will continue to benefit ffrom skilled PT services to promote a healing environment.      Wound Therapy - Functional Problem List  difficulty walking due to pain     Factors Delaying/Impairing Wound Healing  Altered sensation;Incontinence;Multiple medical problems;Polypharmacy;Vascular compromise    Hydrotherapy Plan  Debridement;Dressing change  Wound Therapy - Frequency  2X / week for 4 weeks     Wound Therapy - Current Recommendations  PT    Wound Plan  Measure wounds next session.    Dressing   Lt LE :  medihoney  followed by profore.    Dressing  Lt UE: medihoney, gauze and netting                PT Short Term Goals - 01/28/17 1223      PT SHORT TERM GOAL #1   Title  PT wound to be 80% granulated to reduce risk of cellulitis     Time  2    Period  Weeks    Status  On-going      PT SHORT TERM GOAL #2   Title  PT and daughter to be able to verbalize the risk of cellulitis; the signs and symptoms and the need to seek medical attention     Time  2    Period  Weeks    Status  On-going      PT SHORT TERM GOAL #3   Title  Lt Leg wound size to be no greater than 5x3cm     Time  2    Period  Weeks    Status  On-going      PT SHORT TERM GOAL #4   Title  Smaller of Lt arm wound to be healed     Time  2    Period  Weeks    Status  On-going        PT Long Term Goals - 01/28/17 1223      PT LONG TERM GOAL #1   Title  Pt wound to be 100% granulated to reduce risk for cellulitis     Time  4    Period  Weeks    Status  On-going      PT LONG TERM GOAL #2   Title  Pt LT leg wound to be decreased in size to no greater than 2 x 2 cm to allow daughter to feel comfortable is self care for discharge.     Time  4    Period  Weeks    Status  On-going      PT LONG TERM GOAL #3   Title  PT Pain level with Left leg to be no greater than a 3/10 to allow pt to be able to walk for 5 minutes without increased pain.     Time  4     Period  Days    Status  On-going      PT LONG TERM GOAL #4   Title  PT Larger Lt arm wound to be healed     Time  4    Period  Weeks    Status  On-going            Plan - 02/01/17 1454    Clinical Impression Statement  see above     Rehab Potential  Good    PT Frequency  2x / week    PT Duration  4 weeks    PT Treatment/Interventions  Other (comment);Compression bandaging debridement, cleansing and dressing change     PT Next Visit Plan  as above        Patient will benefit from skilled therapeutic intervention in order to improve the following deficits and impairments:  Pain, Other (comment)(non-healing wound )  Visit Diagnosis: Open wound of left forearm with complication, sequela  Pain in left lower leg  Unspecified open wound, left lower leg, sequela     Problem List Patient Active Problem List   Diagnosis Date Noted  . History of stroke 10/20/2016  . TIA (transient ischemic attack) 10/12/2016  . Acute urinary retention 10/12/2016  . Popliteal artery aneurysm ([place]) 02/05/2016  . Malnutrition of moderate degree 12/15/2015  . DVT of popliteal vein ([place]) 12/14/2015  . Aneurysm of left internal iliac artery ([place]) 12/14/2015  . Sepsis affecting skin ([place]) 12/13/2015  . Dementia 12/13/2015  . GERD (gastroesophageal reflux disease) 12/13/2015  . Severe protein-calorie malnutrition ([place]) 12/13/2015  . Cellulitis of right leg 11/24/2015  . Dyslipidemia 11/24/2015  . Erythema of lower extremity 11/24/2015  . Anemia 11/01/2015  . Esophageal reflux 08/17/2013  . COPD (chronic obstructive pulmonary disease) ([place]) 08/11/2013  . NSTEMI (non-ST elevated myocardial infarction) type 2 07/26/2013  . Dehydration 07/25/2013  . AKI (acute kidney injury) ([place]) 07/25/2013  . Colitis 07/08/2013  . Hydronephrosis, left 07/08/2013  . Microscopic colitis 06/27/2013  . Memory loss 04/25/2013  . Depression 04/25/2013  . SAH (subarachnoid hemorrhage) ([place]) 11/22/2012  .  Seizure disorder secondary to prior [facility] 11/22/2012    Rayetta Humphrey, PT CLT [phone] 02/01/2017, 2:55 PM  [place] [address] [place], [place], [address] Phone: [phone]   Fax:  [phone]  Name: ARNETHA SILVERTHORNE MRN: [id number] Date of Birth: [birth year]

## 2017-02-01 NOTE — Therapy (Signed)
[place], [place], [address] Phone: [phone]   Fax:  [phone]  Occupational Therapy Treatment  Patient Details  Name: Bridget Ball MRN: [id number] Date of Birth: [birth year] Referring Provider: Edwinna Areola hall   Encounter Date: 02/01/2017  OT End of Session - 02/01/17 1510    Visit Number  3    Number of Visits  12    Date for OT Re-Evaluation  03/05/17    Authorization Type  [employer] medicare    OT Start Time  0350 billable time decreased as patient used bathroom 1305-1319 (14 minutes)    OT Stop Time  1345    OT Time Calculation (min)  40 min    Activity Tolerance  Patient tolerated treatment well    Behavior During Therapy  [facility] for tasks assessed/performed       Past Medical History:  Diagnosis Date  . Anxiety   . Arthritis   . Attention to nephrostomy ([place])    PT HAS NEPHROSTOMY TUBE IN PLACE  . Bruises easily   . Cancer [facility])    Skin cancer  . Chronic diarrhea   . Colitis   . COPD (chronic obstructive pulmonary disease) ([place])   . Dementia   . Depression   . GERD (gastroesophageal reflux disease)   . Hiatal hernia   . Hx of bronchitis   . Hx of pulmonary edema JULY 2015  . Hx of pulmonary edema JULY 2015  . Hx of septic shock JULY 2015  . Hydronephrosis, left   . Hyperlipidemia   . Memory difficulties   . Microscopic colitis   . Myocardial infarction [facility])    unknown time   . Renal insufficiency   . Seizures ([place])    last seizure was 4-5 years ago with "brain bleed"  . Shortness of breath    with exertion  . Stroke [facility])    TIA four years ago / stroke NOV 2014    Past Surgical History:  Procedure Laterality Date  . APPENDECTOMY    . BREAST BIOPSY Right 02/02/2014   Procedure: RIGHT BREAST BIOPSY;  Surgeon: Jamesetta So, MD;  Location: [facility] ORS;  Service: General;  Laterality: Right;  . COLONOSCOPY N/A 05/24/2013   Procedure: COLONOSCOPY;  Surgeon: Rogene Houston, MD;  Location: [facility]  ENDO SUITE;  Service: Endoscopy;  Laterality: N/A;  100  . CYSTOSCOPY W/ URETERAL STENT PLACEMENT Left 08/06/2015   Procedure: CYSTOSCOPY WITH LEFT URETERAL STENT EXCHANGE - Sammie Bench;  Surgeon: Franchot Gallo, MD;  Location: [facility] ORS;  Service: Urology;  Laterality: Left;  . CYSTOSCOPY W/ URETERAL STENT PLACEMENT Left 07/07/2016   Procedure: CYSTOSCOPY, LEFT RETROGRADE PYELOGRAM WITH LEFT URETERAL STENT REPLACEMENT;  Surgeon: Franchot Gallo, MD;  Location: [facility] ORS;  Service: Urology;  Laterality: Left;  . CYSTOSCOPY WITH RETROGRADE PYELOGRAM, URETEROSCOPY AND STENT PLACEMENT Left 09/18/2013   Procedure: CYSTOSCOPY WITH RETROGRADE PYELOGRAM,  URETEROSCOPY AND  STENT PLACEMENT, removal of nephrostomy tube;  Surgeon: Jorja Loa, MD;  Location: [facility] ORS;  Service: Urology;  Laterality: Left;  . CYSTOSCOPY WITH STENT PLACEMENT Left 09/04/2014   Procedure: CYSTOSCOPY WITH LEFT J2 STENT EXCHANGE;  Surgeon: Franchot Gallo, MD;  Location: [facility] ORS;  Service: Urology;  Laterality: Left;  . ENDOVASCULAR REPAIR OF POPLITEAL ARTERY ANEURYSM  02/05/2016  . ESOPHAGOGASTRODUODENOSCOPY N/A 05/24/2013   Procedure: ESOPHAGOGASTRODUODENOSCOPY (EGD);  Surgeon: Rogene Houston, MD;  Location: [facility] ENDO SUITE;  Service: Endoscopy;  Laterality: N/A;  . FALSE  ANEURYSM REPAIR Right 02/05/2016   Procedure: REPAIR RIGHT POLITEAL  ANEURYSM;  Surgeon: Serafina Mitchell, MD;  Location: [place];  Service: Vascular;  Laterality: Right;  . KNEE ARTHROSCOPY Right   . SKIN CANCER EXCISION    . TONSILLECTOMY    . TUBAL LIGATION      There were no vitals filed for this visit.  Subjective Assessment - 02/01/17 1456    Currently in Pain?  Yes    Pain Score  7  5/10 right arm    Pain Location  Arm    Pain Orientation  Left    Pain Type  Acute pain    Pain Radiating Towards  N/A    Pain Onset  In the past 7 days    Pain Frequency  Constant    Aggravating Factors   getting dress, use and movement    Pain Relieving Factors  rest     Effect of Pain on Daily Activities  max effect    Multiple Pain Sites  No         [facility] OT Assessment - 02/01/17 1500      Assessment   Medical Diagnosis  Bilateral Shoulder pain      Precautions   Precautions  Fall           OT Treatments/Exercises (OP) - 02/01/17 1500      Exercises   Exercises  Shoulder      Shoulder Exercises: Supine   Protraction  PROM;5 reps;AROM;Left;AAROM;10 reps;Right    Horizontal ABduction  PROM;5 reps;Both    External Rotation  PROM;5 reps;AROM;10 reps;Both    Internal Rotation  PROM;5 reps;AROM;10 reps;Both    Flexion  PROM;5 reps;AAROM;Left;10 reps    ABduction  PROM;5 reps      Manual Therapy   Manual Therapy  Myofascial release;Joint mobilization    Manual therapy comments  completed separately from therapeutic exercises    Joint Mobilization  Joint mobilization and stretch completed to right upper arm to increase ROM and mobilize soft tissue and joint    Myofascial Release  Myofascial release to right upper arm, bilateral trapezius and scapularis regions to decrease pain and fascial restrictions and increase joint range of motion               OT Short Term Goals - 01/27/17 1501      OT SHORT TERM GOAL #1   Title  Patient will be educated and independent with HEP in order to faciliate progress in therapy and allow her to use her BUE's with increased comfort during daily tasks.     Time  3    Period  Weeks    Status  On-going      OT SHORT TERM GOAL #2   Title  Patient will increase BUE P/ROM to [facility] to increase ability to complete dressing tasks with less difficulty.     Time  3    Period  Weeks    Status  On-going      OT SHORT TERM GOAL #3   Title  Patient will increase BUE strength to 4-/5 to increase ability to complete normal household tasks with greater strength and ease.     Time  3    Period  Weeks    Status  On-going      OT SHORT TERM GOAL #4   Title  Patient will report a decreased pain level of 5/10  in BUE's while completing daily tasks.     Time  3    Period  Weeks    Status  On-going        OT Long Term Goals - 01/27/17 1501      OT LONG TERM GOAL #1   Title  Patient will return to highest level of independence while completing all daily tasks using BUE's with reports of decreased pain of 3/10 or less.     Time  6    Period  Weeks    Status  On-going      OT LONG TERM GOAL #2   Title  Patient will increase A/ROM of RUE to [facility] to increase ability to return to using right arm as dominant extremity.     Time  6    Period  Weeks    Status  On-going      OT LONG TERM GOAL #3   Title  Patient will increase BUE strength to 4/10 overall in order to complete normal household tasks with less difficulty.     Time  6    Period  Weeks    Status  On-going      OT LONG TERM GOAL #4   Title  Patient will decrease fasical restrictions in min amount or less in order to increase functional mobility needed for reaching overhead.     Time  6    Period  Weeks    Status  On-going            Plan - 02/01/17 1513    Clinical Impression Statement  A: Session limited by patient requiring to use the bathroom at beginning of session. Only able to complete a portion of AA/ROM and A/ROM supine. Patient with increased pain in left although was able to complete the exercises with light facilitation as needed for guidance. VC for all exercises for form and technique.     Plan  P: Continue with AA/ROM RUE and attempt A/ROM for LUE. Add ball stretches    Consulted and Agree with Plan of Care  Patient       Patient will benefit from skilled therapeutic intervention in order to improve the following deficits and impairments:  Pain, Decreased range of motion, Increased fascial restrictions, Impaired UE functional use, Decreased strength  Visit Diagnosis: Acute pain of left shoulder  Acute pain of right shoulder  Stiffness of right shoulder, not elsewhere classified  Stiffness of left  shoulder, not elsewhere classified  Other symptoms and signs involving the musculoskeletal system    Problem List Patient Active Problem List   Diagnosis Date Noted  . History of stroke 10/20/2016  . TIA (transient ischemic attack) 10/12/2016  . Acute urinary retention 10/12/2016  . Popliteal artery aneurysm ([place]) 02/05/2016  . Malnutrition of moderate degree 12/15/2015  . DVT of popliteal vein ([place]) 12/14/2015  . Aneurysm of left internal iliac artery ([place]) 12/14/2015  . Sepsis affecting skin ([place]) 12/13/2015  . Dementia 12/13/2015  . GERD (gastroesophageal reflux disease) 12/13/2015  . Severe protein-calorie malnutrition ([place]) 12/13/2015  . Cellulitis of right leg 11/24/2015  . Dyslipidemia 11/24/2015  . Erythema of lower extremity 11/24/2015  . Anemia 11/01/2015  . Esophageal reflux 08/17/2013  . COPD (chronic obstructive pulmonary disease) ([place]) 08/11/2013  . NSTEMI (non-ST elevated myocardial infarction) type 2 07/26/2013  . Dehydration 07/25/2013  . AKI (acute kidney injury) ([place]) 07/25/2013  . Colitis 07/08/2013  . Hydronephrosis, left 07/08/2013  . Microscopic colitis 06/27/2013  . Memory loss 04/25/2013  . Depression 04/25/2013  . SAH (subarachnoid hemorrhage) (  [place]) 11/22/2012  . Seizure disorder secondary to prior [facility] 11/22/2012   Ailene Ravel, OTR/L,CBIS  [phone]  02/01/2017, 3:15 PM  [place] [address] [place], [place], [address] Phone: [phone]   Fax:  [phone]  Name: ALEERA GILCREASE MRN: [id number] Date of Birth: 12-28-1928

## 2017-02-02 ENCOUNTER — Ambulatory Visit: Payer: Medicare Other | Admitting: Urology

## 2017-02-02 ENCOUNTER — Other Ambulatory Visit (HOSPITAL_COMMUNITY)
Admission: RE | Admit: 2017-02-02 | Discharge: 2017-02-02 | Disposition: A | Discharge status: Home / Self Care | Payer: Medicare Other | Source: Ambulatory Visit | Attending: Urology | Admitting: Urology

## 2017-02-02 DIAGNOSIS — R350 Frequency of micturition: Secondary | ICD-10-CM

## 2017-02-02 DIAGNOSIS — N952 Postmenopausal atrophic vaginitis: Secondary | ICD-10-CM | POA: Diagnosis not present

## 2017-02-02 DIAGNOSIS — N312 Flaccid neuropathic bladder, not elsewhere classified: Secondary | ICD-10-CM

## 2017-02-02 DIAGNOSIS — N131 Hydronephrosis with ureteral stricture, not elsewhere classified: Secondary | ICD-10-CM

## 2017-02-04 ENCOUNTER — Ambulatory Visit (HOSPITAL_COMMUNITY): Payer: Medicare Other | Admitting: Physical Therapy

## 2017-02-04 ENCOUNTER — Ambulatory Visit (HOSPITAL_COMMUNITY): Payer: Medicare Other | Admitting: Occupational Therapy

## 2017-02-04 ENCOUNTER — Encounter (HOSPITAL_COMMUNITY): Payer: Self-pay | Admitting: Occupational Therapy

## 2017-02-04 DIAGNOSIS — S51802S Unspecified open wound of left forearm, sequela: Secondary | ICD-10-CM | POA: Diagnosis not present

## 2017-02-04 DIAGNOSIS — M25612 Stiffness of left shoulder, not elsewhere classified: Secondary | ICD-10-CM

## 2017-02-04 DIAGNOSIS — M25611 Stiffness of right shoulder, not elsewhere classified: Secondary | ICD-10-CM | POA: Diagnosis not present

## 2017-02-04 DIAGNOSIS — M25511 Pain in right shoulder: Secondary | ICD-10-CM | POA: Diagnosis not present

## 2017-02-04 DIAGNOSIS — M25512 Pain in left shoulder: Secondary | ICD-10-CM | POA: Diagnosis not present

## 2017-02-04 DIAGNOSIS — S81802S Unspecified open wound, left lower leg, sequela: Secondary | ICD-10-CM | POA: Diagnosis not present

## 2017-02-04 DIAGNOSIS — M79662 Pain in left lower leg: Secondary | ICD-10-CM | POA: Diagnosis not present

## 2017-02-04 DIAGNOSIS — R29898 Other symptoms and signs involving the musculoskeletal system: Secondary | ICD-10-CM | POA: Diagnosis not present

## 2017-02-04 LAB — URINE CULTURE

## 2017-02-04 NOTE — Therapy (Signed)
[place], [place], [address] Phone: [phone]   Fax:  [phone]  Occupational Therapy Treatment  Patient Details  Name: Bridget Ball MRN: [id number] Date of Birth: 1928/03/21 Referring Provider: Edwinna Areola hall   Encounter Date: 02/04/2017  OT End of Session - 02/04/17 1117    Visit Number  4    Number of Visits  12    Date for OT Re-Evaluation  03/05/17    Authorization Type  [employer] medicare    OT Start Time  1034    OT Stop Time  1115    OT Time Calculation (min)  41 min    Activity Tolerance  Patient tolerated treatment well    Behavior During Therapy  [facility] for tasks assessed/performed       Past Medical History:  Diagnosis Date  . Anxiety   . Arthritis   . Attention to nephrostomy ([place])    PT HAS NEPHROSTOMY TUBE IN PLACE  . Bruises easily   . Cancer [facility])    Skin cancer  . Chronic diarrhea   . Colitis   . COPD (chronic obstructive pulmonary disease) ([place])   . Dementia   . Depression   . GERD (gastroesophageal reflux disease)   . Hiatal hernia   . Hx of bronchitis   . Hx of pulmonary edema JULY 2015  . Hx of pulmonary edema JULY 2015  . Hx of septic shock JULY 2015  . Hydronephrosis, left   . Hyperlipidemia   . Memory difficulties   . Microscopic colitis   . Myocardial infarction [facility])    unknown time   . Renal insufficiency   . Seizures ([place])    last seizure was 4-5 years ago with "brain bleed"  . Shortness of breath    with exertion  . Stroke [facility])    TIA four years ago / stroke NOV 2014    Past Surgical History:  Procedure Laterality Date  . APPENDECTOMY    . BREAST BIOPSY Right 02/02/2014   Procedure: RIGHT BREAST BIOPSY;  Surgeon: Jamesetta So, MD;  Location: [facility] ORS;  Service: General;  Laterality: Right;  . COLONOSCOPY N/A 05/24/2013   Procedure: COLONOSCOPY;  Surgeon: Rogene Houston, MD;  Location: [facility] ENDO SUITE;  Service: Endoscopy;  Laterality: N/A;  100  . CYSTOSCOPY W/  URETERAL STENT PLACEMENT Left 08/06/2015   Procedure: CYSTOSCOPY WITH LEFT URETERAL STENT EXCHANGE - Sammie Bench;  Surgeon: Franchot Gallo, MD;  Location: [facility] ORS;  Service: Urology;  Laterality: Left;  . CYSTOSCOPY W/ URETERAL STENT PLACEMENT Left 07/07/2016   Procedure: CYSTOSCOPY, LEFT RETROGRADE PYELOGRAM WITH LEFT URETERAL STENT REPLACEMENT;  Surgeon: Franchot Gallo, MD;  Location: [facility] ORS;  Service: Urology;  Laterality: Left;  . CYSTOSCOPY WITH RETROGRADE PYELOGRAM, URETEROSCOPY AND STENT PLACEMENT Left 09/18/2013   Procedure: CYSTOSCOPY WITH RETROGRADE PYELOGRAM,  URETEROSCOPY AND  STENT PLACEMENT, removal of nephrostomy tube;  Surgeon: Jorja Loa, MD;  Location: [facility] ORS;  Service: Urology;  Laterality: Left;  . CYSTOSCOPY WITH STENT PLACEMENT Left 09/04/2014   Procedure: CYSTOSCOPY WITH LEFT J2 STENT EXCHANGE;  Surgeon: Franchot Gallo, MD;  Location: [facility] ORS;  Service: Urology;  Laterality: Left;  . ENDOVASCULAR REPAIR OF POPLITEAL ARTERY ANEURYSM  02/05/2016  . ESOPHAGOGASTRODUODENOSCOPY N/A 05/24/2013   Procedure: ESOPHAGOGASTRODUODENOSCOPY (EGD);  Surgeon: Rogene Houston, MD;  Location: [facility] ENDO SUITE;  Service: Endoscopy;  Laterality: N/A;  . FALSE ANEURYSM REPAIR Right 02/05/2016   Procedure: REPAIR RIGHT POLITEAL  ANEURYSM;  Surgeon: Serafina Mitchell, MD;  Location: [place];  Service: Vascular;  Laterality: Right;  . KNEE ARTHROSCOPY Right   . SKIN CANCER EXCISION    . TONSILLECTOMY    . TUBAL LIGATION      There were no vitals filed for this visit.  Subjective Assessment - 02/04/17 1034    Subjective   S: This one is kind of iffy. (right)    Currently in Pain?  Yes    Pain Score  2     Pain Location  Shoulder    Pain Orientation  Right    Pain Descriptors / Indicators  Sore    Pain Type  Acute pain    Pain Radiating Towards  n/a    Pain Onset  In the past 7 days    Pain Frequency  Constant    Aggravating Factors   getting dressed, use, movement    Pain Relieving  Factors  rest    Effect of Pain on Daily Activities  max effect on ADL completion    Multiple Pain Sites  No         [facility] OT Assessment - 02/04/17 1034      Assessment   Medical Diagnosis  Bilateral Shoulder pain      Precautions   Precautions  Fall      Restrictions   Weight Bearing Restrictions  No             OT Treatments/Exercises (OP) - 02/04/17 1038      Exercises   Exercises  Shoulder      Shoulder Exercises: Supine   Protraction  PROM;5 reps;AROM;Left;AAROM;10 reps;Right    Horizontal ABduction  PROM;5 reps;Both;AROM;Left;10 reps    External Rotation  PROM;5 reps;AROM;10 reps;Both    Internal Rotation  PROM;5 reps;AROM;10 reps;Both    Flexion  PROM;5 reps;AAROM;Both;10 reps    ABduction  PROM;5 reps      Shoulder Exercises: Therapy Ball   Flexion  10 reps    ABduction  10 reps      Shoulder Exercises: ROM/Strengthening   Wall Wash  1' each arm      Manual Therapy   Manual Therapy  Myofascial release    Manual therapy comments  completed separately from therapeutic exercises    Myofascial Release  Myofascial release to right upper arm, bilateral trapezius and scapularis regions to decrease pain and fascial restrictions and increase joint range of motion               OT Short Term Goals - 01/27/17 1501      OT SHORT TERM GOAL #1   Title  Patient will be educated and independent with HEP in order to faciliate progress in therapy and allow her to use her BUE's with increased comfort during daily tasks.     Time  3    Period  Weeks    Status  On-going      OT SHORT TERM GOAL #2   Title  Patient will increase BUE P/ROM to [facility] to increase ability to complete dressing tasks with less difficulty.     Time  3    Period  Weeks    Status  On-going      OT SHORT TERM GOAL #3   Title  Patient will increase BUE strength to 4-/5 to increase ability to complete normal household tasks with greater strength and ease.     Time  3    Period  Weeks  Status  On-going      OT SHORT TERM GOAL #4   Title  Patient will report a decreased pain level of 5/10 in BUE's while completing daily tasks.     Time  3    Period  Weeks    Status  On-going        OT Long Term Goals - 01/27/17 1501      OT LONG TERM GOAL #1   Title  Patient will return to highest level of independence while completing all daily tasks using BUE's with reports of decreased pain of 3/10 or less.     Time  6    Period  Weeks    Status  On-going      OT LONG TERM GOAL #2   Title  Patient will increase A/ROM of RUE to [facility] to increase ability to return to using right arm as dominant extremity.     Time  6    Period  Weeks    Status  On-going      OT LONG TERM GOAL #3   Title  Patient will increase BUE strength to 4/10 overall in order to complete normal household tasks with less difficulty.     Time  6    Period  Weeks    Status  On-going      OT LONG TERM GOAL #4   Title  Patient will decrease fasical restrictions in min amount or less in order to increase functional mobility needed for reaching overhead.     Time  6    Period  Weeks    Status  On-going            Plan - 02/04/17 1055    Clinical Impression Statement  A: Pt's daughter reporting pt is not using her arms much at home. Continued with a combination of A/ROM and AA/ROM in supine, pt able to complete A/ROM protraction  and horizontal abduction in supine. Consistent verbal cuing required for correct form and technique during exercises. Some tactile facilitation required for exercises intermittently during session. Added ball stretches this session, cuing for hand placement and technique.     Plan  P: Conitnue with A/ROM and/or AA/ROM progressing to seated exercises if able.        Patient will benefit from skilled therapeutic intervention in order to improve the following deficits and impairments:  Pain, Decreased range of motion, Increased fascial restrictions, Impaired UE functional use,  Decreased strength  Visit Diagnosis: Acute pain of left shoulder  Acute pain of right shoulder  Stiffness of right shoulder, not elsewhere classified  Stiffness of left shoulder, not elsewhere classified  Other symptoms and signs involving the musculoskeletal system    Problem List Patient Active Problem List   Diagnosis Date Noted  . History of stroke 10/20/2016  . TIA (transient ischemic attack) 10/12/2016  . Acute urinary retention 10/12/2016  . Popliteal artery aneurysm ([place]) 02/05/2016  . Malnutrition of moderate degree 12/15/2015  . DVT of popliteal vein ([place]) 12/14/2015  . Aneurysm of left internal iliac artery ([place]) 12/14/2015  . Sepsis affecting skin ([place]) 12/13/2015  . Dementia 12/13/2015  . GERD (gastroesophageal reflux disease) 12/13/2015  . Severe protein-calorie malnutrition ([place]) 12/13/2015  . Cellulitis of right leg 11/24/2015  . Dyslipidemia 11/24/2015  . Erythema of lower extremity 11/24/2015  . Anemia 11/01/2015  . Esophageal reflux 08/17/2013  . COPD (chronic obstructive pulmonary disease) ([place]) 08/11/2013  . NSTEMI (non-ST elevated myocardial infarction) type 2 07/26/2013  .  Dehydration 07/25/2013  . AKI (acute kidney injury) ([place]) 07/25/2013  . Colitis 07/08/2013  . Hydronephrosis, left 07/08/2013  . Microscopic colitis 06/27/2013  . Memory loss 04/25/2013  . Depression 04/25/2013  . SAH (subarachnoid hemorrhage) ([place]) 11/22/2012  . Seizure disorder secondary to prior [facility] 11/22/2012   Guadelupe Sabin, OTR/L  [phone] 02/04/2017, 11:18 AM  [place], [place], [address] Phone: [phone]   Fax:  [phone]  Name: SHELANDA DUVALL MRN: [id number] Date of Birth: 06-Mar-1928

## 2017-02-04 NOTE — Therapy (Signed)
Bridget Ball, [place], [address] Phone: [phone]   Fax:  [phone]  Wound Care Therapy  Patient Details  Name: Bridget Ball MRN: [id number] Date of Birth: 06-30-1928 Referring Provider: Edwinna Areola hall   Encounter Date: 02/04/2017    Past Medical History:  Diagnosis Date  . Anxiety   . Arthritis   . Attention to nephrostomy ([place])    PT HAS NEPHROSTOMY TUBE IN PLACE  . Bruises easily   . Cancer [facility])    Skin cancer  . Chronic diarrhea   . Colitis   . COPD (chronic obstructive pulmonary disease) ([place])   . Dementia   . Depression   . GERD (gastroesophageal reflux disease)   . Hiatal hernia   . Hx of bronchitis   . Hx of pulmonary edema JULY 2015  . Hx of pulmonary edema JULY 2015  . Hx of septic shock JULY 2015  . Hydronephrosis, left   . Hyperlipidemia   . Memory difficulties   . Microscopic colitis   . Myocardial infarction [facility])    unknown time   . Renal insufficiency   . Seizures ([place])    last seizure was 4-5 years ago with "brain bleed"  . Shortness of breath    with exertion  . Stroke [facility])    TIA four years ago / stroke NOV 2014    Past Surgical History:  Procedure Laterality Date  . APPENDECTOMY    . BREAST BIOPSY Right 02/02/2014   Procedure: RIGHT BREAST BIOPSY;  Surgeon: Jamesetta So, MD;  Location: [facility] ORS;  Service: General;  Laterality: Right;  . COLONOSCOPY N/A 05/24/2013   Procedure: COLONOSCOPY;  Surgeon: Rogene Houston, MD;  Location: [facility] ENDO SUITE;  Service: Endoscopy;  Laterality: N/A;  100  . CYSTOSCOPY W/ URETERAL STENT PLACEMENT Left 08/06/2015   Procedure: CYSTOSCOPY WITH LEFT URETERAL STENT EXCHANGE - Sammie Bench;  Surgeon: Franchot Gallo, MD;  Location: [facility] ORS;  Service: Urology;  Laterality: Left;  . CYSTOSCOPY W/ URETERAL STENT PLACEMENT Left 07/07/2016   Procedure: CYSTOSCOPY, LEFT RETROGRADE PYELOGRAM WITH LEFT URETERAL STENT REPLACEMENT;  Surgeon: Franchot Gallo,  MD;  Location: [facility] OR[facility];  Service: Urology;  Laterality: Left;  . CYSTOSCOPY WITH RETROGRADE PYELOGRAM, URETEROSCOPY AND STENT PLACEMENT Left 09/18/2013   Procedure: CYSTOSCOPY WITH RETROGRADE PYELOGRAM,  URETEROSCOPY AND  STENT PLACEMENT, removal of nephrostomy tube;  Surgeon: Jorja Loa, MD;  [facility];  Service: Urology;  Laterality: Left;  . CYSTOSCOPY WITH STENT PLACEMENT Left 09/04/2014   Procedure: CYSTOSCOPY WITH LEFT J2 STENT EXCHANGE;  Surgeon: Franchot Gallo, MD;  Location: [facility] OR[facility];  Service: Urology;  Laterality: Left;  . ENDOVASCULAR REPAIR OF POPLITEAL ARTERY ANEURYSM  02/05/2016  . ESOPHAGOGASTRODUODENOSCOPY N/A 05/24/2013   Procedure: ESOPHAGOGASTRODUODENOSCOPY (EGD);  Surgeon: Rogene Houston, MD;  Location[facility];  Service: Endoscopy;  Laterality: N/A;  . FALSE ANEURYSM REPAIR Right 02/05/2016   Procedure: REPAIR RIGHT POLITEAL  ANEURYSM;  Surgeon: Serafina Mitchell, MD;  Location: [place];  Service: Vascular;  Laterality: Right;  . KNEE ARTHROSCOPY Right   . SKIN CANCER EXCISION    . TONSILLECTOMY    . TUBAL LIGATION      There were no vitals filed for this visit.              Wound Therapy - 02/04/17 1318    Subjective  daughter states she gave her an ativan to help calm her today.  Pt  reports no pain or discomfort    Pain Assessment  No/denies pain    Evaluation and Treatment Procedures Explained to Patient/Family  Yes    Evaluation and Treatment Procedures  agreed to    Wound Properties Date First Assessed: 01/26/17 Time First Assessed: 6578 Wound Type: Laceration Location: Arm Location Orientation: Left Wound Description (Comments): Lt arm 2 wounds one 1.0x 1.5 the second 1.2 x 1.5 cm  Present on Admission: Yes   Dressing Type  Gauze (Comment)    Dressing Changed  Changed    Dressing Status  Intact;Old drainage    Dressing Change Frequency  PRN    Site / Wound Assessment  Granulation tissue;Friable    % Wound base Red or Granulating  95%     % Wound base Yellow/Fibrinous Exudate  5%    Wound Length (cm)  4.2 cm    Wound Width (cm)  2.8 cm    Wound Depth (cm)  0 cm    Wound Volume (cm^3)  0 cm^3    Wound Surface Area (cm^2)  11.76 cm^2    Drainage Amount  Minimal    Drainage Description  Serosanguineous    Treatment  Cleansed;Debridement (Selective)    Wound Properties Date First Assessed: 01/26/17 Time First Assessed: 1527 Wound Type: Laceration Location: Leg Location Orientation: Left;Anterior Wound Description (Comments): on  shin Present on Admission: Yes   Dressing Type  Compression wrap    Dressing Changed  Changed    Dressing Status  Intact;Old drainage    Dressing Change Frequency  PRN    Site / Wound Assessment  Granulation tissue;Friable    % Wound base Red or Granulating  95%    % Wound base Yellow/Fibrinous Exudate  5%    Peri-wound Assessment  Intact    Wound Length (cm)  7.2 cm    Wound Width (cm)  4 cm    Wound Depth (cm)  0 cm    Wound Volume (cm^3)  0 cm^3    Wound Surface Area (cm^2)  28.8 cm^2    Drainage Amount  Minimal    Drainage Description  Serosanguineous    Treatment  Cleansed;Debridement (Selective)    Selective Debridement - Location  all wounds     Selective Debridement - Tools Used  Forceps gauze    Selective Debridement - Tissue Removed  slough and biofilm    Wound Therapy - Clinical Statement  UE and LE wounds are now nearly 100% granualted.  ABle to remove most of the remaining slough from wounds and perimeter to promote approximation.  PT tolerated debridment very well today.  Changed dressing to xeroform on both wounds, changed to medipore tape dressing on Lt UE due to discomfort, sliding down and sticking.  Continued with profore on Lt LE.   Measurements reveal improvment, UE measurement increased from original, however measured entire circumference area.    Wound Plan  continue with woundcare and appropriate dressing changes.    Dressing   Lt LE :  xeroform  followed by profore.     Dressing  Lt UE: xeroform, gauze, medipore and netting                PT Short Term Goals - 01/28/17 1223      PT SHORT TERM GOAL #1   Title  PT wound to be 80% granulated to reduce risk of cellulitis     Time  2    Period  Weeks    Status  On-going  PT SHORT TERM GOAL #2   Title  PT and daughter to be able to verbalize the risk of cellulitis; the signs and symptoms and the need to seek medical attention     Time  2    Period  Weeks    Status  On-going      PT SHORT TERM GOAL #3   Title  Lt Leg wound size to be no greater than 5x3cm     Time  2    Period  Weeks    Status  On-going      PT SHORT TERM GOAL #4   Title  Smaller of Lt arm wound to be healed     Time  2    Period  Weeks    Status  On-going        PT Long Term Goals - 01/28/17 1223      PT LONG TERM GOAL #1   Title  Pt wound to be 100% granulated to reduce risk for cellulitis     Time  4    Period  Weeks    Status  On-going      PT LONG TERM GOAL #2   Title  Pt LT leg wound to be decreased in size to no greater than 2 x 2 cm to allow daughter to feel comfortable is self care for discharge.     Time  4    Period  Weeks    Status  On-going      PT LONG TERM GOAL #3   Title  PT Pain level with Left leg to be no greater than a 3/10 to allow pt to be able to walk for 5 minutes without increased pain.     Time  4    Period  Days    Status  On-going      PT LONG TERM GOAL #4   Title  PT Larger Lt arm wound to be healed     Time  4    Period  Weeks    Status  On-going              Patient will benefit from skilled therapeutic intervention in order to improve the following deficits and impairments:     Visit Diagnosis: Open wound of left forearm with complication, sequela  Pain in left lower leg     Problem List Patient Active Problem List   Diagnosis Date Noted  . History of stroke 10/20/2016  . TIA (transient ischemic attack) 10/12/2016  . Acute urinary retention  10/12/2016  . Popliteal artery aneurysm ([place]) 02/05/2016  . Malnutrition of moderate degree 12/15/2015  . DVT of popliteal vein ([place]) 12/14/2015  . Aneurysm of left internal iliac artery ([place]) 12/14/2015  . Sepsis affecting skin ([place]) 12/13/2015  . Dementia 12/13/2015  . GERD (gastroesophageal reflux disease) 12/13/2015  . Severe protein-calorie malnutrition ([place]) 12/13/2015  . Cellulitis of right leg 11/24/2015  . Dyslipidemia 11/24/2015  . Erythema of lower extremity 11/24/2015  . Anemia 11/01/2015  . Esophageal reflux 08/17/2013  . COPD (chronic obstructive pulmonary disease) ([place]) 08/11/2013  . NSTEMI (non-ST elevated myocardial infarction) type 2 07/26/2013  . Dehydration 07/25/2013  . AKI (acute kidney injury) ([place]) 07/25/2013  . Colitis 07/08/2013  . Hydronephrosis, left 07/08/2013  . Microscopic colitis 06/27/2013  . Memory loss 04/25/2013  . Depression 04/25/2013  . SAH (subarachnoid hemorrhage) ([place]) 11/22/2012  . Seizure disorder secondary to prior [facility] 11/22/2012   Teena Irani, PTA/CLT [phone]  Teena Irani 02/04/2017, 1:27 PM  [facility] [address] [place], [place], [address] Phone: [phone]   Fax:  [phone]  Name: Bridget Ball MRN: [id number] Date of Birth: 1928/10/10

## 2017-02-07 DIAGNOSIS — J441 Chronic obstructive pulmonary disease with (acute) exacerbation: Secondary | ICD-10-CM | POA: Diagnosis not present

## 2017-02-07 DIAGNOSIS — L03113 Cellulitis of right upper limb: Secondary | ICD-10-CM | POA: Diagnosis not present

## 2017-02-07 DIAGNOSIS — L97909 Non-pressure chronic ulcer of unspecified part of unspecified lower leg with unspecified severity: Secondary | ICD-10-CM | POA: Diagnosis not present

## 2017-02-08 ENCOUNTER — Ambulatory Visit (HOSPITAL_COMMUNITY): Payer: Medicare Other | Admitting: Physical Therapy

## 2017-02-08 ENCOUNTER — Other Ambulatory Visit: Payer: Self-pay

## 2017-02-08 ENCOUNTER — Ambulatory Visit (HOSPITAL_COMMUNITY): Payer: Medicare Other | Attending: Internal Medicine

## 2017-02-08 DIAGNOSIS — R29898 Other symptoms and signs involving the musculoskeletal system: Secondary | ICD-10-CM | POA: Diagnosis not present

## 2017-02-08 DIAGNOSIS — S81802S Unspecified open wound, left lower leg, sequela: Secondary | ICD-10-CM | POA: Diagnosis not present

## 2017-02-08 DIAGNOSIS — M25511 Pain in right shoulder: Secondary | ICD-10-CM

## 2017-02-08 DIAGNOSIS — M25612 Stiffness of left shoulder, not elsewhere classified: Secondary | ICD-10-CM

## 2017-02-08 DIAGNOSIS — M25611 Stiffness of right shoulder, not elsewhere classified: Secondary | ICD-10-CM | POA: Diagnosis not present

## 2017-02-08 DIAGNOSIS — S51802S Unspecified open wound of left forearm, sequela: Secondary | ICD-10-CM

## 2017-02-08 DIAGNOSIS — M79662 Pain in left lower leg: Secondary | ICD-10-CM | POA: Diagnosis not present

## 2017-02-08 DIAGNOSIS — M25512 Pain in left shoulder: Secondary | ICD-10-CM

## 2017-02-08 NOTE — Therapy (Signed)
[place], [place], [address] Phone: [phone]   Fax:  [phone]  Wound Care Therapy  Patient Details  Name: Bridget Ball MRN: [id number] Date of Birth: [birth year] Referring Provider: Edwinna Areola hall   Encounter Date: 02/08/2017  PT End of Session - 02/08/17 1608    Visit Number  4    Number of Visits  8    Date for PT Re-Evaluation  02/25/17    Authorization Type  [employer] medicare    Authorization - Visit Number  4    Authorization - Number of Visits  8    PT Start Time  1520    PT Stop Time  1600    PT Time Calculation (min)  40 min    Activity Tolerance  Patient limited by pain    Behavior During Therapy  Anxious       Past Medical History:  Diagnosis Date  . Anxiety   . Arthritis   . Attention to nephrostomy ([place])    PT HAS NEPHROSTOMY TUBE IN PLACE  . Bruises easily   . Cancer [facility])    Skin cancer  . Chronic diarrhea   . Colitis   . COPD (chronic obstructive pulmonary disease) ([place])   . Dementia   . Depression   . GERD (gastroesophageal reflux disease)   . Hiatal hernia   . Hx of bronchitis   . Hx of pulmonary edema JULY 2015  . Hx of pulmonary edema JULY 2015  . Hx of septic shock JULY 2015  . Hydronephrosis, left   . Hyperlipidemia   . Memory difficulties   . Microscopic colitis   . Myocardial infarction [facility])    unknown time   . Renal insufficiency   . Seizures ([place])    last seizure was 4-5 years ago with "brain bleed"  . Shortness of breath    with exertion  . Stroke [facility])    TIA four years ago / stroke NOV 2014    Past Surgical History:  Procedure Laterality Date  . APPENDECTOMY    . BREAST BIOPSY Right 02/02/2014   Procedure: RIGHT BREAST BIOPSY;  Surgeon: Jamesetta So, MD;  Location: [facility] ORS;  Service: General;  Laterality: Right;  . COLONOSCOPY N/A 05/24/2013   Procedure: COLONOSCOPY;  Surgeon: Rogene Houston, MD;  Location: [facility];  Service: Endoscopy;  Laterality:  N/A;  100  . CYSTOSCOPY W/ URETERAL STENT PLACEMENT Left 08/06/2015   Procedure: CYSTOSCOPY WITH LEFT URETERAL STENT EXCHANGE - Sammie Bench;  Surgeon: Franchot Gallo, MD;  Location: [facility];  Service: Urology;  Laterality: Left;  . CYSTOSCOPY W/ URETERAL STENT PLACEMENT Left 07/07/2016   Procedure: CYSTOSCOPY, LEFT RETROGRADE PYELOGRAM WITH LEFT URETERAL STENT REPLACEMENT;  Surgeon: Franchot Gallo, MD;  Location: [facility];  Service: Urology;  Laterality: Left;  . CYSTOSCOPY WITH RETROGRADE PYELOGRAM, URETEROSCOPY AND STENT PLACEMENT Left 09/18/2013   Procedure: CYSTOSCOPY WITH RETROGRADE PYELOGRAM,  URETEROSCOPY AND  STENT PLACEMENT, removal of nephrostomy tube;  Surgeon: Jorja Loa, MD;  Location: [facility];  Service: Urology;  Laterality: Left;  . CYSTOSCOPY WITH STENT PLACEMENT Left 09/04/2014   Procedure: CYSTOSCOPY WITH LEFT J2 STENT EXCHANGE;  Surgeon: Franchot Gallo, MD;  Location: [facility];  Service: Urology;  Laterality: Left;  . ENDOVASCULAR REPAIR OF POPLITEAL ARTERY ANEURYSM  02/05/2016  . ESOPHAGOGASTRODUODENOSCOPY N/A 05/24/2013   Procedure: ESOPHAGOGASTRODUODENOSCOPY (EGD);  Surgeon: Rogene Houston, MD;  [facility];  Service: Endoscopy;  Laterality: N/A;  . FALSE ANEURYSM REPAIR Right 02/05/2016   Procedure: REPAIR RIGHT POLITEAL  ANEURYSM;  Surgeon: Serafina Mitchell, MD;  Location: [place];  Service: Vascular;  Laterality: Right;  . KNEE ARTHROSCOPY Right   . SKIN CANCER EXCISION    . TONSILLECTOMY    . TUBAL LIGATION      There were no vitals filed for this visit.              Wound Therapy - 02/08/17 1603    Subjective  daughter states she had to take pt to urgent care secondary to elbow swelling/redness and fever.  Noted to have cellulities.  Small abraison on elbow but without drainage.  Daughter states pt removed UE bandage and tried to push down the one on her LE.  Pt appears anxious this session; no meds have been given.     Pain Assessment   No/denies pain    Evaluation and Treatment Procedures Explained to Patient/Family  Yes    Evaluation and Treatment Procedures  agreed to    Wound Properties Date First Assessed: 01/26/17 Time First Assessed: 5465 Wound Type: Laceration Location: Arm Location Orientation: Left Wound Description (Comments): Lt arm 2 wounds one 1.0x 1.5 the second 1.2 x 1.5 cm  Present on Admission: Yes   Dressing Type  Gauze (Comment)    Dressing Changed  Changed    Dressing Status  Intact;Old drainage    Dressing Change Frequency  PRN    Site / Wound Assessment  Granulation tissue;Friable    % Wound base Red or Granulating  95%    % Wound base Yellow/Fibrinous Exudate  5%    Drainage Amount  Scant    Drainage Description  Serosanguineous    Treatment  Cleansed;Debridement (Selective)    Wound Properties Date First Assessed: 01/26/17 Time First Assessed: 1527 Wound Type: Laceration Location: Leg Location Orientation: Left;Anterior Wound Description (Comments): on  shin Present on Admission: Yes   Dressing Type  Compression wrap    Dressing Changed  Changed    Dressing Status  Intact;Old drainage    Dressing Change Frequency  PRN    Site / Wound Assessment  Granulation tissue;Friable    % Wound base Red or Granulating  95%    % Wound base Yellow/Fibrinous Exudate  5%    Peri-wound Assessment  Intact    Drainage Amount  Minimal    Drainage Description  Serosanguineous    Treatment  Cleansed;Debridement (Selective)    Selective Debridement - Location  all wounds     Selective Debridement - Tools Used  Forceps gauze    Selective Debridement - Tissue Removed  slough and biofilm    Wound Therapy - Clinical Statement  Continued improvment and approximation of all wounds.  Debrided borders of Lt LE, UE did not need sharps debridment.  Cnontinued with xeroform.   Noted small area on tip of elbow.  Cleansed and covered this, however no other care provided.     Wound Plan  continue with woundcare and appropriate  dressing changes.    Dressing   Lt LE :  xeroform  followed by profore.    Dressing  Lt UE: xeroform, gauze, medipore and netting                PT Short Term Goals - 01/28/17 1223      PT SHORT TERM GOAL #1   Title  PT wound to be 80% granulated to reduce risk of cellulitis  Time  2    Period  Weeks    Status  On-going      PT SHORT TERM GOAL #2   Title  PT and daughter to be able to verbalize the risk of cellulitis; the signs and symptoms and the need to seek medical attention     Time  2    Period  Weeks    Status  On-going      PT SHORT TERM GOAL #3   Title  Lt Leg wound size to be no greater than 5x3cm     Time  2    Period  Weeks    Status  On-going      PT SHORT TERM GOAL #4   Title  Smaller of Lt arm wound to be healed     Time  2    Period  Weeks    Status  On-going        PT Long Term Goals - 01/28/17 1223      PT LONG TERM GOAL #1   Title  Pt wound to be 100% granulated to reduce risk for cellulitis     Time  4    Period  Weeks    Status  On-going      PT LONG TERM GOAL #2   Title  Pt LT leg wound to be decreased in size to no greater than 2 x 2 cm to allow daughter to feel comfortable is self care for discharge.     Time  4    Period  Weeks    Status  On-going      PT LONG TERM GOAL #3   Title  PT Pain level with Left leg to be no greater than a 3/10 to allow pt to be able to walk for 5 minutes without increased pain.     Time  4    Period  Days    Status  On-going      PT LONG TERM GOAL #4   Title  PT Larger Lt arm wound to be healed     Time  4    Period  Weeks    Status  On-going              Patient will benefit from skilled therapeutic intervention in order to improve the following deficits and impairments:     Visit Diagnosis: Open wound of left forearm with complication, sequela  Pain in left lower leg     Problem List Patient Active Problem List   Diagnosis Date Noted  . History of stroke 10/20/2016  .  TIA (transient ischemic attack) 10/12/2016  . Acute urinary retention 10/12/2016  . Popliteal artery aneurysm ([place]) 02/05/2016  . Malnutrition of moderate degree 12/15/2015  . DVT of popliteal vein ([place]) 12/14/2015  . Aneurysm of left internal iliac artery ([place]) 12/14/2015  . Sepsis affecting skin ([place]) 12/13/2015  . Dementia 12/13/2015  . GERD (gastroesophageal reflux disease) 12/13/2015  . Severe protein-calorie malnutrition ([place]) 12/13/2015  . Cellulitis of right leg 11/24/2015  . Dyslipidemia 11/24/2015  . Erythema of lower extremity 11/24/2015  . Anemia 11/01/2015  . Esophageal reflux 08/17/2013  . COPD (chronic obstructive pulmonary disease) ([place]) 08/11/2013  . NSTEMI (non-ST elevated myocardial infarction) type 2 07/26/2013  . Dehydration 07/25/2013  . AKI (acute kidney injury) ([place]) 07/25/2013  . Colitis 07/08/2013  . Hydronephrosis, left 07/08/2013  . Microscopic colitis 06/27/2013  . Memory loss 04/25/2013  . Depression 04/25/2013  . SAH (  subarachnoid hemorrhage) ([place]) 11/22/2012  . Seizure disorder secondary to prior [facility] 11/22/2012   Teena Irani, PTA/CLT [phone]  Teena Irani 02/08/2017, 4:09 PM  [facility] [address] [place], [place], [address] Phone: [phone]   Fax:  [phone]  Name: Bridget Ball MRN: [id number] Date of Birth: [birth year]

## 2017-02-08 NOTE — Therapy (Signed)
[place], [place], [address] Phone: [phone]   Fax:  [phone]  Occupational Therapy Treatment  Patient Details  Name: Bridget Ball MRN: [id number] Date of Birth: 1928-11-07 Referring Provider: Edwinna Areola hall   Encounter Date: 02/08/2017  OT End of Session - 02/08/17 1726    Visit Number  5    Number of Visits  12    Date for OT Re-Evaluation  03/05/17    Authorization Type  [employer] medicare    OT Start Time  1600    OT Stop Time  1635    OT Time Calculation (min)  35 min    Activity Tolerance  Patient tolerated treatment well    Behavior During Therapy  [facility] for tasks assessed/performed       Past Medical History:  Diagnosis Date  . Anxiety   . Arthritis   . Attention to nephrostomy ([place])    PT HAS NEPHROSTOMY TUBE IN PLACE  . Bruises easily   . Cancer [facility])    Skin cancer  . Chronic diarrhea   . Colitis   . COPD (chronic obstructive pulmonary disease) ([place])   . Dementia   . Depression   . GERD (gastroesophageal reflux disease)   . Hiatal hernia   . Hx of bronchitis   . Hx of pulmonary edema JULY 2015  . Hx of pulmonary edema JULY 2015  . Hx of septic shock JULY 2015  . Hydronephrosis, left   . Hyperlipidemia   . Memory difficulties   . Microscopic colitis   . Myocardial infarction [facility])    unknown time   . Renal insufficiency   . Seizures ([place])    last seizure was 4-5 years ago with "brain bleed"  . Shortness of breath    with exertion  . Stroke [facility])    TIA four years ago / stroke NOV 2014    Past Surgical History:  Procedure Laterality Date  . APPENDECTOMY    . BREAST BIOPSY Right 02/02/2014   Procedure: RIGHT BREAST BIOPSY;  Surgeon: Jamesetta So, MD;  Location: [facility] ORS;  Service: General;  Laterality: Right;  . COLONOSCOPY N/A 05/24/2013   Procedure: COLONOSCOPY;  Surgeon: Rogene Houston, MD;  Location: [facility] ENDO SUITE;  Service: Endoscopy;  Laterality: N/A;  100  . CYSTOSCOPY W/  URETERAL STENT PLACEMENT Left 08/06/2015   Procedure: CYSTOSCOPY WITH LEFT URETERAL STENT EXCHANGE - Sammie Bench;  Surgeon: Franchot Gallo, MD;  Location: [facility] ORS;  Service: Urology;  Laterality: Left;  . CYSTOSCOPY W/ URETERAL STENT PLACEMENT Left 07/07/2016   Procedure: CYSTOSCOPY, LEFT RETROGRADE PYELOGRAM WITH LEFT URETERAL STENT REPLACEMENT;  Surgeon: Franchot Gallo, MD;  Location: [facility] ORS;  Service: Urology;  Laterality: Left;  . CYSTOSCOPY WITH RETROGRADE PYELOGRAM, URETEROSCOPY AND STENT PLACEMENT Left 09/18/2013   Procedure: CYSTOSCOPY WITH RETROGRADE PYELOGRAM,  URETEROSCOPY AND  STENT PLACEMENT, removal of nephrostomy tube;  Surgeon: Jorja Loa, MD;  Location: [facility] ORS;  Service: Urology;  Laterality: Left;  . CYSTOSCOPY WITH STENT PLACEMENT Left 09/04/2014   Procedure: CYSTOSCOPY WITH LEFT J2 STENT EXCHANGE;  Surgeon: Franchot Gallo, MD;  Location: [facility] ORS;  Service: Urology;  Laterality: Left;  . ENDOVASCULAR REPAIR OF POPLITEAL ARTERY ANEURYSM  02/05/2016  . ESOPHAGOGASTRODUODENOSCOPY N/A 05/24/2013   Procedure: ESOPHAGOGASTRODUODENOSCOPY (EGD);  Surgeon: Rogene Houston, MD;  Location: [facility] ENDO SUITE;  Service: Endoscopy;  Laterality: N/A;  . FALSE ANEURYSM REPAIR Right 02/05/2016   Procedure: REPAIR RIGHT POLITEAL  ANEURYSM;  Surgeon: Serafina Mitchell, MD;  Location: [place];  Service: Vascular;  Laterality: Right;  . KNEE ARTHROSCOPY Right   . SKIN CANCER EXCISION    . TONSILLECTOMY    . TUBAL LIGATION      There were no vitals filed for this visit.  Subjective Assessment - 02/08/17 1714    Subjective   S: Why arm I so winded?    Currently in Pain?  No/denies         [facility] OT Assessment - 02/08/17 1715      Assessment   Medical Diagnosis  Bilateral Shoulder pain      Precautions   Precautions  Fall            OT Treatments/Exercises (OP) - 02/08/17 0001      Exercises   Exercises  Shoulder      Shoulder Exercises: Supine   Protraction  PROM;5  reps;AROM;Right;AAROM;Left;10 reps    Horizontal ABduction  PROM;5 reps;Both    External Rotation  PROM;5 reps;AROM;Both;10 reps    Internal Rotation  PROM;5 reps;AROM;Both;10 reps    Flexion  PROM;5 reps;AROM;Right;AAROM;Left;10 reps    ABduction  PROM;5 reps      Shoulder Exercises: ROM/Strengthening   Wall Wash  1' alternating between right and left due to pain    Other ROM/Strengthening Exercises  seated PVC pipe slide; 10X flexion with each arm.               OT Short Term Goals - 01/27/17 1501      OT SHORT TERM GOAL #1   Title  Patient will be educated and independent with HEP in order to faciliate progress in therapy and allow her to use her BUE's with increased comfort during daily tasks.     Time  3    Period  Weeks    Status  On-going      OT SHORT TERM GOAL #2   Title  Patient will increase BUE P/ROM to [facility] to increase ability to complete dressing tasks with less difficulty.     Time  3    Period  Weeks    Status  On-going      OT SHORT TERM GOAL #3   Title  Patient will increase BUE strength to 4-/5 to increase ability to complete normal household tasks with greater strength and ease.     Time  3    Period  Weeks    Status  On-going      OT SHORT TERM GOAL #4   Title  Patient will report a decreased pain level of 5/10 in BUE's while completing daily tasks.     Time  3    Period  Weeks    Status  On-going        OT Long Term Goals - 01/27/17 1501      OT LONG TERM GOAL #1   Title  Patient will return to highest level of independence while completing all daily tasks using BUE's with reports of decreased pain of 3/10 or less.     Time  6    Period  Weeks    Status  On-going      OT LONG TERM GOAL #2   Title  Patient will increase A/ROM of RUE to [facility] to increase ability to return to using right arm as dominant extremity.     Time  6    Period  Weeks    Status  On-going  OT LONG TERM GOAL #3   Title  Patient will increase BUE strength to  4/10 overall in order to complete normal household tasks with less difficulty.     Time  6    Period  Weeks    Status  On-going      OT LONG TERM GOAL #4   Title  Patient will decrease fasical restrictions in min amount or less in order to increase functional mobility needed for reaching overhead.     Time  6    Period  Weeks    Status  On-going            Plan - 02/08/17 1731    Clinical Impression Statement  A: Session ended early as patient had reports of not feeling well, she felt winded and her arms were not up for the exercises. patient continues to have difficulty completing all exercises at A/ROM supine. Daughter reports that she is not using her arms at home and patient's progress in therapy is confirming this. Verbal and physical curing is needed for all exercises.     Plan  P: Continue to focus on functional activities such as dressing by doing ball circles around waist and head.     Consulted and Agree with Plan of Care  Patient;Family member/caregiver    Family Member Consulted  Daughter: Lattie Haw       Patient will benefit from skilled therapeutic intervention in order to improve the following deficits and impairments:  Pain, Decreased range of motion, Increased fascial restrictions, Impaired UE functional use, Decreased strength  Visit Diagnosis: Acute pain of left shoulder  Acute pain of right shoulder  Stiffness of right shoulder, not elsewhere classified  Stiffness of left shoulder, not elsewhere classified  Other symptoms and signs involving the musculoskeletal system    Problem List Patient Active Problem List   Diagnosis Date Noted  . History of stroke 10/20/2016  . TIA (transient ischemic attack) 10/12/2016  . Acute urinary retention 10/12/2016  . Popliteal artery aneurysm ([place]) 02/05/2016  . Malnutrition of moderate degree 12/15/2015  . DVT of popliteal vein ([place]) 12/14/2015  . Aneurysm of left internal iliac artery ([place]) 12/14/2015  . Sepsis  affecting skin ([place]) 12/13/2015  . Dementia 12/13/2015  . GERD (gastroesophageal reflux disease) 12/13/2015  . Severe protein-calorie malnutrition ([place]) 12/13/2015  . Cellulitis of right leg 11/24/2015  . Dyslipidemia 11/24/2015  . Erythema of lower extremity 11/24/2015  . Anemia 11/01/2015  . Esophageal reflux 08/17/2013  . COPD (chronic obstructive pulmonary disease) ([place]) 08/11/2013  . NSTEMI (non-ST elevated myocardial infarction) type 2 07/26/2013  . Dehydration 07/25/2013  . AKI (acute kidney injury) ([place]) 07/25/2013  . Colitis 07/08/2013  . Hydronephrosis, left 07/08/2013  . Microscopic colitis 06/27/2013  . Memory loss 04/25/2013  . Depression 04/25/2013  . SAH (subarachnoid hemorrhage) ([place]) 11/22/2012  . Seizure disorder secondary to prior [facility] 11/22/2012   Ailene Ravel, OTR/L,CBIS  [phone]  02/08/2017, 5:50 PM  [place] [address] [place], [place], [address] Phone: [phone]   Fax:  [phone]  Name: Bridget Ball MRN: [id number] Date of Birth: [birth year]

## 2017-02-11 ENCOUNTER — Ambulatory Visit (HOSPITAL_COMMUNITY): Payer: Medicare Other

## 2017-02-11 ENCOUNTER — Ambulatory Visit (HOSPITAL_COMMUNITY): Payer: Medicare Other | Admitting: Physical Therapy

## 2017-02-11 ENCOUNTER — Encounter (HOSPITAL_COMMUNITY): Payer: Self-pay

## 2017-02-11 ENCOUNTER — Other Ambulatory Visit: Payer: Self-pay

## 2017-02-11 DIAGNOSIS — R29898 Other symptoms and signs involving the musculoskeletal system: Secondary | ICD-10-CM

## 2017-02-11 DIAGNOSIS — S51802S Unspecified open wound of left forearm, sequela: Secondary | ICD-10-CM | POA: Diagnosis not present

## 2017-02-11 DIAGNOSIS — M25611 Stiffness of right shoulder, not elsewhere classified: Secondary | ICD-10-CM | POA: Diagnosis not present

## 2017-02-11 DIAGNOSIS — M79662 Pain in left lower leg: Secondary | ICD-10-CM | POA: Diagnosis not present

## 2017-02-11 DIAGNOSIS — S81802S Unspecified open wound, left lower leg, sequela: Secondary | ICD-10-CM

## 2017-02-11 DIAGNOSIS — M25512 Pain in left shoulder: Secondary | ICD-10-CM | POA: Diagnosis not present

## 2017-02-11 DIAGNOSIS — M25612 Stiffness of left shoulder, not elsewhere classified: Secondary | ICD-10-CM

## 2017-02-11 DIAGNOSIS — M25511 Pain in right shoulder: Secondary | ICD-10-CM | POA: Diagnosis not present

## 2017-02-11 NOTE — Therapy (Signed)
[place], [place], [address] Phone: [phone]   Fax:  [phone]  Wound Care Therapy  Patient Details  Name: Bridget Ball MRN: [id number] Date of Birth: 1928/03/15 Referring Provider: Edwinna Areola hall   Encounter Date: 02/11/2017  PT End of Session - 02/11/17 1747    Visit Number  5    Number of Visits  8    Date for PT Re-Evaluation  02/25/17    Authorization Type  [employer] medicare    Authorization - Visit Number  5    Authorization - Number of Visits  8    Activity Tolerance  Patient limited by pain    Behavior During Therapy  Anxious       Past Medical History:  Diagnosis Date  . Anxiety   . Arthritis   . Attention to nephrostomy ([place])    PT HAS NEPHROSTOMY TUBE IN PLACE  . Bruises easily   . Cancer [facility])    Skin cancer  . Chronic diarrhea   . Colitis   . COPD (chronic obstructive pulmonary disease) ([place])   . Dementia   . Depression   . GERD (gastroesophageal reflux disease)   . Hiatal hernia   . Hx of bronchitis   . Hx of pulmonary edema JULY 2015  . Hx of pulmonary edema JULY 2015  . Hx of septic shock JULY 2015  . Hydronephrosis, left   . Hyperlipidemia   . Memory difficulties   . Microscopic colitis   . Myocardial infarction [facility])    unknown time   . Renal insufficiency   . Seizures ([place])    last seizure was 4-5 years ago with "brain bleed"  . Shortness of breath    with exertion  . Stroke [facility])    TIA four years ago / stroke NOV 2014    Past Surgical History:  Procedure Laterality Date  . APPENDECTOMY    . BREAST BIOPSY Right 02/02/2014   Procedure: RIGHT BREAST BIOPSY;  Surgeon: Jamesetta So, MD;  Location: [facility] ORS;  Service: General;  Laterality: Right;  . COLONOSCOPY N/A 05/24/2013   Procedure: COLONOSCOPY;  Surgeon: Rogene Houston, MD;  Location: [facility] ENDO SUITE;  Service: Endoscopy;  Laterality: N/A;  100  . CYSTOSCOPY W/ URETERAL STENT PLACEMENT Left 08/06/2015   Procedure:  CYSTOSCOPY WITH LEFT URETERAL STENT EXCHANGE - Sammie Bench;  Surgeon: Franchot Gallo, MD;  Location: [facility] ORS;  Service: Urology;  Laterality: Left;  . CYSTOSCOPY W/ URETERAL STENT PLACEMENT Left 07/07/2016   Procedure: CYSTOSCOPY, LEFT RETROGRADE PYELOGRAM WITH LEFT URETERAL STENT REPLACEMENT;  Surgeon: Franchot Gallo, MD;  Location: [facility] ORS;  Service: Urology;  Laterality: Left;  . CYSTOSCOPY WITH RETROGRADE PYELOGRAM, URETEROSCOPY AND STENT PLACEMENT Left 09/18/2013   Procedure: CYSTOSCOPY WITH RETROGRADE PYELOGRAM,  URETEROSCOPY AND  STENT PLACEMENT, removal of nephrostomy tube;  Surgeon: Jorja Loa, MD;  Location: [facility] ORS;  Service: Urology;  Laterality: Left;  . CYSTOSCOPY WITH STENT PLACEMENT Left 09/04/2014   Procedure: CYSTOSCOPY WITH LEFT J2 STENT EXCHANGE;  Surgeon: Franchot Gallo, MD;  Location: [facility] ORS;  Service: Urology;  Laterality: Left;  . ENDOVASCULAR REPAIR OF POPLITEAL ARTERY ANEURYSM  02/05/2016  . ESOPHAGOGASTRODUODENOSCOPY N/A 05/24/2013   Procedure: ESOPHAGOGASTRODUODENOSCOPY (EGD);  Surgeon: Rogene Houston, MD;  Location: [facility] ENDO SUITE;  Service: Endoscopy;  Laterality: N/A;  . FALSE ANEURYSM REPAIR Right 02/05/2016   Procedure: REPAIR RIGHT POLITEAL  ANEURYSM;  Surgeon: Serafina Mitchell, MD;  Location:  [facility] OR;  Service: Vascular;  Laterality: Right;  . KNEE ARTHROSCOPY Right   . SKIN CANCER EXCISION    . TONSILLECTOMY    . TUBAL LIGATION      There were no vitals filed for this visit.              Wound Therapy - 02/11/17 1700    Subjective  able to keep all dressings intact this session.    Pain Assessment  No/denies pain    Evaluation and Treatment Procedures Explained to Patient/Family  Yes    Evaluation and Treatment Procedures  agreed to    Wound Properties Date First Assessed: 01/26/17 Time First Assessed: 9417 Wound Type: Laceration Location: Arm Location Orientation: Left Wound Description (Comments): Lt arm 2 wounds one 1.0x 1.5 the second  1.2 x 1.5 cm  Present on Admission: Yes   Dressing Type  Gauze (Comment)    Dressing Changed  Changed    Dressing Status  Intact;Old drainage    Dressing Change Frequency  PRN    Site / Wound Assessment  Granulation tissue;Friable    % Wound base Red or Granulating  100%    % Wound base Yellow/Fibrinous Exudate  0%    Wound Length (cm)  1.5 cm was 4.2    Wound Width (cm)  2 cm was 2.8    Wound Depth (cm)  0 cm    Wound Volume (cm^3)  0 cm^3    Wound Surface Area (cm^2)  3 cm^2    Drainage Amount  Scant    Drainage Description  Serosanguineous    Treatment  Cleansed;Debridement (Selective)    Wound Properties Date First Assessed: 01/26/17 Time First Assessed: 1527 Wound Type: Laceration Location: Leg Location Orientation: Left;Anterior Wound Description (Comments): on  shin Present on Admission: Yes   Dressing Type  Compression wrap    Dressing Status  Intact;Old drainage    Dressing Change Frequency  PRN    Site / Wound Assessment  Granulation tissue;Friable    % Wound base Red or Granulating  95%    % Wound base Yellow/Fibrinous Exudate  5%    Peri-wound Assessment  Intact    Wound Length (cm)  4.5 cm was 7.2 cm    Wound Width (cm)  3 cm was 4 cm    Wound Depth (cm)  0 cm    Wound Volume (cm^3)  0 cm^3    Wound Surface Area (cm^2)  13.5 cm^2    Drainage Amount  Minimal    Drainage Description  Serosanguineous    Treatment  Cleansed;Debridement (Selective)    Wound Properties Date First Assessed: 02/11/17 Time First Assessed: 1705 Wound Type: Other (Comment) Location: Elbow Location Orientation: Left Wound Description (Comments): tip of elbow  Present on Admission: No   Dressing Changed  New    Dressing Status  New drainage    Dressing Change Frequency  PRN    Site / Wound Assessment  Pale;Yellow    % Wound base Red or Granulating  0%    % Wound base Yellow/Fibrinous Exudate  100%    Peri-wound Assessment  Edema;Erythema (blanchable)    Wound Length (cm)  0.5 cm    Wound  Width (cm)  0.8 cm    Wound Depth (cm)  1 cm    Wound Volume (cm^3)  0.4 cm^3    Wound Surface Area (cm^2)  0.4 cm^2    Drainage Amount  Minimal    Drainage Description  Serous  Treatment  Cleansed;Debridement (Selective)    Incision Properties Date First Assessed: 02/05/16 Time First Assessed: 0932 Location: Leg Location Orientation: Right   Selective Debridement - Location  all wounds     Selective Debridement - Tools Used  Forceps gauze    Selective Debridement - Tissue Removed  slough and biofilm    Wound Therapy - Clinical Statement  Drainage as well as increased slough and definitive wound borders noted at Lt elbow this session.  Wound measured with also unknown depth but at least 1 cm.  Perimeter stuill with some swelling and erythema, however much improved from last visit.  Pt will need woundcare added to Lt elbow.  Forearm wound and LE wound also remeasured with noted reduction in size and increased granualation.  These wounds are improving well.      Wound Plan  continue with woundcare with addition of Lt elbow and appropriate dressing changes.    Dressing   Lt LE :  xeroform  followed by profore., #6 netting    Dressing  Lt UE: xeroform, gauze, medipore and #5 netting    Dressing  Lt elbow:  medihoney gel gauze, medipore, #5 netting                PT Short Term Goals - 01/28/17 1223      PT SHORT TERM GOAL #1   Title  PT wound to be 80% granulated to reduce risk of cellulitis     Time  2    Period  Weeks    Status  On-going      PT SHORT TERM GOAL #2   Title  PT and daughter to be able to verbalize the risk of cellulitis; the signs and symptoms and the need to seek medical attention     Time  2    Period  Weeks    Status  On-going      PT SHORT TERM GOAL #3   Title  Lt Leg wound size to be no greater than 5x3cm     Time  2    Period  Weeks    Status  On-going      PT SHORT TERM GOAL #4   Title  Smaller of Lt arm wound to be healed     Time  2    Period   Weeks    Status  On-going        PT Long Term Goals - 01/28/17 1223      PT LONG TERM GOAL #1   Title  Pt wound to be 100% granulated to reduce risk for cellulitis     Time  4    Period  Weeks    Status  On-going      PT LONG TERM GOAL #2   Title  Pt LT leg wound to be decreased in size to no greater than 2 x 2 cm to allow daughter to feel comfortable is self care for discharge.     Time  4    Period  Weeks    Status  On-going      PT LONG TERM GOAL #3   Title  PT Pain level with Left leg to be no greater than a 3/10 to allow pt to be able to walk for 5 minutes without increased pain.     Time  4    Period  Days    Status  On-going      PT LONG TERM GOAL #4   Title  PT Larger Lt arm wound to be healed     Time  4    Period  Weeks    Status  On-going              Patient will benefit from skilled therapeutic intervention in order to improve the following deficits and impairments:     Visit Diagnosis: Open wound of left forearm with complication, sequela  Pain in left lower leg  Unspecified open wound, left lower leg, sequela     Problem List Patient Active Problem List   Diagnosis Date Noted  . History of stroke 10/20/2016  . TIA (transient ischemic attack) 10/12/2016  . Acute urinary retention 10/12/2016  . Popliteal artery aneurysm ([place]) 02/05/2016  . Malnutrition of moderate degree 12/15/2015  . DVT of popliteal vein ([place]) 12/14/2015  . Aneurysm of left internal iliac artery ([place]) 12/14/2015  . Sepsis affecting skin ([place]) 12/13/2015  . Dementia 12/13/2015  . GERD (gastroesophageal reflux disease) 12/13/2015  . Severe protein-calorie malnutrition ([place]) 12/13/2015  . Cellulitis of right leg 11/24/2015  . Dyslipidemia 11/24/2015  . Erythema of lower extremity 11/24/2015  . Anemia 11/01/2015  . Esophageal reflux 08/17/2013  . COPD (chronic obstructive pulmonary disease) ([place]) 08/11/2013  . NSTEMI (non-ST elevated myocardial infarction) type 2  07/26/2013  . Dehydration 07/25/2013  . AKI (acute kidney injury) ([place]) 07/25/2013  . Colitis 07/08/2013  . Hydronephrosis, left 07/08/2013  . Microscopic colitis 06/27/2013  . Memory loss 04/25/2013  . Depression 04/25/2013  . SAH (subarachnoid hemorrhage) ([place]) 11/22/2012  . Seizure disorder secondary to prior [facility] 11/22/2012   Teena Irani, PTA/CLT [phone]  Teena Irani 02/11/2017, 5:48 PM  [place] [address] [place], [place], [address] Phone: [phone]   Fax:  [phone]  Name: TINE MABEE MRN: [id number] Date of Birth: [birth year]

## 2017-02-11 NOTE — Patient Instructions (Addendum)
Repeat all exercises 10-15 times, 1-2 times per day.  1) Shoulder Protraction    Begin with elbows by your side, slowly "punch" straight out in front of you.      2) Shoulder Flexion  Sitting:         Begin with arms at your side with thumbs pointed up, slowly raise both arms up and forward towards overhead.      3) Horizontal abduction/adduction        Straight arms holding cane at shoulder height, bring cane to right, center, left. Repeat starting to left.  4) Internal & External Rotation    *No band* -Sit with elbows at the side and elbows bent 90 degrees. Move your forearms away from your body, then bring back inward toward the body.     5) Shoulder Abduction  Sitting:  * Try to raise arms to shoulder level at least.       begin with your arms next to your side. Slowly move your arms out to the side so that they go overhead, in a jumping jack or snow angel movement.

## 2017-02-12 DIAGNOSIS — J449 Chronic obstructive pulmonary disease, unspecified: Secondary | ICD-10-CM | POA: Diagnosis not present

## 2017-02-12 NOTE — Therapy (Signed)
[place], [place], [address] Phone: [phone]   Fax:  [phone]  Occupational Therapy Treatment  Patient Details  Name: Bridget Ball MRN: [id number] Date of Birth: 1928-10-01 Referring Provider: Edwinna Areola hall   Encounter Date: 02/11/2017  OT End of Session - 02/11/17 1703    Visit Number  6    Number of Visits  12    Date for OT Re-Evaluation  03/05/17    Authorization Type  [employer] medicare    OT Start Time  1700 Pt finished PT session late then used the bathroom.    OT Stop Time  1733    OT Time Calculation (min)  33 min    Activity Tolerance  Patient tolerated treatment well    Behavior During Therapy  WFL for tasks assessed/performed       Past Medical History:  Diagnosis Date  . Anxiety   . Arthritis   . Attention to nephrostomy ([place])    PT HAS NEPHROSTOMY TUBE IN PLACE  . Bruises easily   . Cancer [facility])    Skin cancer  . Chronic diarrhea   . Colitis   . COPD (chronic obstructive pulmonary disease) ([place])   . Dementia   . Depression   . GERD (gastroesophageal reflux disease)   . Hiatal hernia   . Hx of bronchitis   . Hx of pulmonary edema JULY 2015  . Hx of pulmonary edema JULY 2015  . Hx of septic shock JULY 2015  . Hydronephrosis, left   . Hyperlipidemia   . Memory difficulties   . Microscopic colitis   . Myocardial infarction [facility])    unknown time   . Renal insufficiency   . Seizures ([place])    last seizure was 4-5 years ago with "brain bleed"  . Shortness of breath    with exertion  . Stroke [facility])    TIA four years ago / stroke NOV 2014    Past Surgical History:  Procedure Laterality Date  . APPENDECTOMY    . BREAST BIOPSY Right 02/02/2014   Procedure: RIGHT BREAST BIOPSY;  Surgeon: Jamesetta So, MD;  Location: [facility] ORS;  Service: General;  Laterality: Right;  . COLONOSCOPY N/A 05/24/2013   Procedure: COLONOSCOPY;  Surgeon: Rogene Houston, MD;  Location: [facility] ENDO SUITE;  Service:  Endoscopy;  Laterality: N/A;  100  . CYSTOSCOPY W/ URETERAL STENT PLACEMENT Left 08/06/2015   Procedure: CYSTOSCOPY WITH LEFT URETERAL STENT EXCHANGE - Sammie Bench;  Surgeon: Franchot Gallo, MD;  Location: [facility] ORS;  Service: Urology;  Laterality: Left;  . CYSTOSCOPY W/ URETERAL STENT PLACEMENT Left 07/07/2016   Procedure: CYSTOSCOPY, LEFT RETROGRADE PYELOGRAM WITH LEFT URETERAL STENT REPLACEMENT;  Surgeon: Franchot Gallo, MD;  Location: [facility] ORS;  Service: Urology;  Laterality: Left;  . CYSTOSCOPY WITH RETROGRADE PYELOGRAM, URETEROSCOPY AND STENT PLACEMENT Left 09/18/2013   Procedure: CYSTOSCOPY WITH RETROGRADE PYELOGRAM,  URETEROSCOPY AND  STENT PLACEMENT, removal of nephrostomy tube;  Surgeon: Jorja Loa, MD;  Location: [facility] ORS;  Service: Urology;  Laterality: Left;  . CYSTOSCOPY WITH STENT PLACEMENT Left 09/04/2014   Procedure: CYSTOSCOPY WITH LEFT J2 STENT EXCHANGE;  Surgeon: Franchot Gallo, MD;  Location: [facility] ORS;  Service: Urology;  Laterality: Left;  . ENDOVASCULAR REPAIR OF POPLITEAL ARTERY ANEURYSM  02/05/2016  . ESOPHAGOGASTRODUODENOSCOPY N/A 05/24/2013   Procedure: ESOPHAGOGASTRODUODENOSCOPY (EGD);  Surgeon: Rogene Houston, MD;  Location: [facility] ENDO SUITE;  Service: Endoscopy;  Laterality: N/A;  . FALSE ANEURYSM  REPAIR Right 02/05/2016   Procedure: REPAIR RIGHT POLITEAL  ANEURYSM;  Surgeon: Serafina Mitchell, MD;  Location: [place];  Service: Vascular;  Laterality: Right;  . KNEE ARTHROSCOPY Right   . SKIN CANCER EXCISION    . TONSILLECTOMY    . TUBAL LIGATION      There were no vitals filed for this visit.  Subjective Assessment - 02/11/17 1705    Subjective   Daughter reports that she did the exercises with her and she needs to be pushed during the session.     Currently in Pain?  Yes    Pain Score  6     Pain Location  Shoulder    Pain Orientation  Right;Left    Pain Descriptors / Indicators  Sore    Pain Type  Acute pain    Pain Radiating Towards  N/A    Pain Onset  1 to 4  weeks ago    Pain Frequency  Constant    Aggravating Factors   movement and use    Pain Relieving Factors  rest    Effect of Pain on Daily Activities  mod effect. She will stop do to pain.     Multiple Pain Sites  No         [facility] OT Assessment - 02/11/17 1657      Assessment   Medical Diagnosis  Bilateral Shoulder pain      Precautions   Precautions  Fall               OT Treatments/Exercises (OP) - 02/11/17 1657      Exercises   Exercises  Shoulder      Shoulder Exercises: Seated   Protraction  AAROM;10 reps    Horizontal ABduction  AAROM;10 reps    External Rotation  AROM;10 reps    Internal Rotation  AROM;10 reps    Flexion  AAROM;10 reps    Abduction  AROM;10 reps      Shoulder Exercises: ROM/Strengthening   X to V Arms  5X    Proximal Shoulder Strengthening, Seated  10X only able to do paddle and criss cross. No circles.     Other ROM/Strengthening Exercises  standing PVC pipe slide; 10X with each arm. Required light muscle tapping ocassionally to complete.       Functional Reaching Activities   Mid Level  Pt completed functional reaching task with resistive clothespin task while seated. Pt able to use right UE to place pins on vertical to 14 inches from base and removed with left hand.              OT Education - 02/11/17 1727    Education provided  Yes     Educations: A/ROM and AA/ROM exercises seated. Provided VC and visual demonstration. Handout.  Patient/Daughter: verbalized understanding. Pt demonstrated understanding with direct supervision.  OT Short Term Goals - 01/27/17 1501      OT SHORT TERM GOAL #1   Title  Patient will be educated and independent with HEP in order to faciliate progress in therapy and allow her to use her BUE's with increased comfort during daily tasks.     Time  3    Period  Weeks    Status  On-going      OT SHORT TERM GOAL #2   Title  Patient will increase BUE P/ROM to [facility] to increase ability to complete  dressing tasks with less difficulty.     Time  3  Period  Weeks    Status  On-going      OT SHORT TERM GOAL #3   Title  Patient will increase BUE strength to 4-/5 to increase ability to complete normal household tasks with greater strength and ease.     Time  3    Period  Weeks    Status  On-going      OT SHORT TERM GOAL #4   Title  Patient will report a decreased pain level of 5/10 in BUE's while completing daily tasks.     Time  3    Period  Weeks    Status  On-going        OT Long Term Goals - 01/27/17 1501      OT LONG TERM GOAL #1   Title  Patient will return to highest level of independence while completing all daily tasks using BUE's with reports of decreased pain of 3/10 or less.     Time  6    Period  Weeks    Status  On-going      OT LONG TERM GOAL #2   Title  Patient will increase A/ROM of RUE to [facility] to increase ability to return to using right arm as dominant extremity.     Time  6    Period  Weeks    Status  On-going      OT LONG TERM GOAL #3   Title  Patient will increase BUE strength to 4/10 overall in order to complete normal household tasks with less difficulty.     Time  6    Period  Weeks    Status  On-going      OT LONG TERM GOAL #4   Title  Patient will decrease fasical restrictions in min amount or less in order to increase functional mobility needed for reaching overhead.     Time  6    Period  Weeks    Status  On-going            Plan - 02/12/17 0813    Clinical Impression Statement  A: Daughter requesting that we firmly push patient as she needs to be motivated to complete exercises. Session focused seated A/ROM and AA/ROM exercises as well as functional reaching. Pt did well although did require continous encouragement to complete all reps. Rest breaks taken as needed.  VC for form and technique.    Plan  P: D/C myofascial release and passive stretching. Focus on functional tasks while using BUE such as functional reaching and  dressing. Complete ball circles and/or ball circles around waist and head. Follow with daughter on new HEP.     Consulted and Agree with Plan of Care  Patient;Family member/caregiver    Family Member Consulted  Daughter: Lattie Haw       Patient will benefit from skilled therapeutic intervention in order to improve the following deficits and impairments:  Pain, Decreased range of motion, Increased fascial restrictions, Impaired UE functional use, Decreased strength  Visit Diagnosis: Stiffness of right shoulder, not elsewhere classified  Acute pain of right shoulder  Acute pain of left shoulder  Stiffness of left shoulder, not elsewhere classified  Other symptoms and signs involving the musculoskeletal system    Problem List Patient Active Problem List   Diagnosis Date Noted  . History of stroke 10/20/2016  . TIA (transient ischemic attack) 10/12/2016  . Acute urinary retention 10/12/2016  . Popliteal artery aneurysm ([place]) 02/05/2016  . Malnutrition of moderate degree 12/15/2015  .  DVT of popliteal vein ([place]) 12/14/2015  . Aneurysm of left internal iliac artery ([place]) 12/14/2015  . Sepsis affecting skin ([place]) 12/13/2015  . Dementia 12/13/2015  . GERD (gastroesophageal reflux disease) 12/13/2015  . Severe protein-calorie malnutrition ([place]) 12/13/2015  . Cellulitis of right leg 11/24/2015  . Dyslipidemia 11/24/2015  . Erythema of lower extremity 11/24/2015  . Anemia 11/01/2015  . Esophageal reflux 08/17/2013  . COPD (chronic obstructive pulmonary disease) ([place]) 08/11/2013  . NSTEMI (non-ST elevated myocardial infarction) type 2 07/26/2013  . Dehydration 07/25/2013  . AKI (acute kidney injury) ([place]) 07/25/2013  . Colitis 07/08/2013  . Hydronephrosis, left 07/08/2013  . Microscopic colitis 06/27/2013  . Memory loss 04/25/2013  . Depression 04/25/2013  . SAH (subarachnoid hemorrhage) ([place]) 11/22/2012  . Seizure disorder secondary to prior [facility] 11/22/2012   Ailene Ravel,  OTR/L,CBIS  [phone]  02/12/2017, 8:23 AM  [place], [place], [address] Phone: [phone]   Fax:  [phone]  Name: Bridget Ball MRN: [id number] Date of Birth: 07-22-1928

## 2017-02-15 ENCOUNTER — Telehealth (HOSPITAL_COMMUNITY): Payer: Self-pay | Admitting: Physical Therapy

## 2017-02-15 ENCOUNTER — Encounter (HOSPITAL_COMMUNITY): Payer: Self-pay

## 2017-02-15 ENCOUNTER — Emergency Department (HOSPITAL_COMMUNITY): Payer: Medicare Other

## 2017-02-15 ENCOUNTER — Ambulatory Visit (HOSPITAL_COMMUNITY): Payer: Medicare Other

## 2017-02-15 ENCOUNTER — Inpatient Hospital Stay (HOSPITAL_COMMUNITY)
Admission: EM | Admit: 2017-02-15 | Discharge: 2017-02-17 | DRG: 190 | Disposition: A | Discharge status: Home / Self Care | Payer: Medicare Other | Attending: Family Medicine | Admitting: Family Medicine

## 2017-02-15 ENCOUNTER — Ambulatory Visit (HOSPITAL_COMMUNITY): Payer: Medicare Other | Admitting: Physical Therapy

## 2017-02-15 ENCOUNTER — Other Ambulatory Visit: Payer: Self-pay

## 2017-02-15 DIAGNOSIS — G40909 Epilepsy, unspecified, not intractable, without status epilepticus: Secondary | ICD-10-CM | POA: Diagnosis present

## 2017-02-15 DIAGNOSIS — Z681 Body mass index (BMI) 19 or less, adult: Secondary | ICD-10-CM

## 2017-02-15 DIAGNOSIS — I252 Old myocardial infarction: Secondary | ICD-10-CM | POA: Diagnosis not present

## 2017-02-15 DIAGNOSIS — I83029 Varicose veins of left lower extremity with ulcer of unspecified site: Secondary | ICD-10-CM | POA: Diagnosis not present

## 2017-02-15 DIAGNOSIS — F419 Anxiety disorder, unspecified: Secondary | ICD-10-CM | POA: Diagnosis present

## 2017-02-15 DIAGNOSIS — Z880 Allergy status to penicillin: Secondary | ICD-10-CM | POA: Diagnosis not present

## 2017-02-15 DIAGNOSIS — J441 Chronic obstructive pulmonary disease with (acute) exacerbation: Secondary | ICD-10-CM | POA: Diagnosis not present

## 2017-02-15 DIAGNOSIS — R05 Cough: Secondary | ICD-10-CM | POA: Diagnosis not present

## 2017-02-15 DIAGNOSIS — F329 Major depressive disorder, single episode, unspecified: Secondary | ICD-10-CM | POA: Diagnosis present

## 2017-02-15 DIAGNOSIS — Z87891 Personal history of nicotine dependence: Secondary | ICD-10-CM | POA: Diagnosis not present

## 2017-02-15 DIAGNOSIS — F0390 Unspecified dementia without behavioral disturbance: Secondary | ICD-10-CM | POA: Diagnosis present

## 2017-02-15 DIAGNOSIS — J9601 Acute respiratory failure with hypoxia: Secondary | ICD-10-CM | POA: Diagnosis not present

## 2017-02-15 DIAGNOSIS — Z79899 Other long term (current) drug therapy: Secondary | ICD-10-CM

## 2017-02-15 DIAGNOSIS — K219 Gastro-esophageal reflux disease without esophagitis: Secondary | ICD-10-CM | POA: Diagnosis present

## 2017-02-15 DIAGNOSIS — Z85828 Personal history of other malignant neoplasm of skin: Secondary | ICD-10-CM

## 2017-02-15 DIAGNOSIS — Z7951 Long term (current) use of inhaled steroids: Secondary | ICD-10-CM | POA: Diagnosis not present

## 2017-02-15 DIAGNOSIS — R0602 Shortness of breath: Secondary | ICD-10-CM

## 2017-02-15 DIAGNOSIS — E43 Unspecified severe protein-calorie malnutrition: Secondary | ICD-10-CM | POA: Diagnosis present

## 2017-02-15 DIAGNOSIS — S81802A Unspecified open wound, left lower leg, initial encounter: Secondary | ICD-10-CM

## 2017-02-15 DIAGNOSIS — E785 Hyperlipidemia, unspecified: Secondary | ICD-10-CM | POA: Diagnosis not present

## 2017-02-15 DIAGNOSIS — L97929 Non-pressure chronic ulcer of unspecified part of left lower leg with unspecified severity: Secondary | ICD-10-CM | POA: Diagnosis not present

## 2017-02-15 DIAGNOSIS — R569 Unspecified convulsions: Secondary | ICD-10-CM | POA: Diagnosis not present

## 2017-02-15 DIAGNOSIS — Z7982 Long term (current) use of aspirin: Secondary | ICD-10-CM | POA: Diagnosis not present

## 2017-02-15 DIAGNOSIS — I69398 Other sequelae of cerebral infarction: Secondary | ICD-10-CM | POA: Diagnosis not present

## 2017-02-15 DIAGNOSIS — R0902 Hypoxemia: Secondary | ICD-10-CM

## 2017-02-15 DIAGNOSIS — S81802D Unspecified open wound, left lower leg, subsequent encounter: Secondary | ICD-10-CM | POA: Diagnosis not present

## 2017-02-15 DIAGNOSIS — J9690 Respiratory failure, unspecified, unspecified whether with hypoxia or hypercapnia: Secondary | ICD-10-CM | POA: Diagnosis present

## 2017-02-15 LAB — CBC WITH DIFFERENTIAL/PLATELET
BASOS ABS: 0 10*3/uL (ref 0.0–0.1)
BASOS PCT: 0 %
Eosinophils Absolute: 0.1 10*3/uL (ref 0.0–0.7)
Eosinophils Relative: 2 %
HEMATOCRIT: 37.4 % (ref 36.0–46.0)
Hemoglobin: 11.9 g/dL — ABNORMAL LOW (ref 12.0–15.0)
LYMPHS PCT: 13 %
Lymphs Abs: 0.8 10*3/uL (ref 0.7–4.0)
MCH: 30.6 pg (ref 26.0–34.0)
MCHC: 31.8 g/dL (ref 30.0–36.0)
MCV: 96.1 fL (ref 78.0–100.0)
Monocytes Absolute: 0.5 10*3/uL (ref 0.1–1.0)
Monocytes Relative: 8 %
NEUTROS ABS: 4.6 10*3/uL (ref 1.7–7.7)
NEUTROS PCT: 77 %
Platelets: 273 10*3/uL (ref 150–400)
RBC: 3.89 MIL/uL (ref 3.87–5.11)
RDW: 15.1 % (ref 11.5–15.5)
WBC: 6 10*3/uL (ref 4.0–10.5)

## 2017-02-15 LAB — BASIC METABOLIC PANEL
ANION GAP: 11 (ref 5–15)
BUN: 14 mg/dL (ref 6–20)
CHLORIDE: 100 mmol/L — AB (ref 101–111)
CO2: 26 mmol/L (ref 22–32)
Calcium: 9 mg/dL (ref 8.9–10.3)
Creatinine, Ser: 1.07 mg/dL — ABNORMAL HIGH (ref 0.44–1.00)
GFR calc non Af Amer: 46 mL/min — ABNORMAL LOW (ref >60)
GFR, EST AFRICAN AMERICAN: 53 mL/min — AB (ref >60)
Glucose, Bld: 90 mg/dL (ref 65–99)
POTASSIUM: 4.2 mmol/L (ref 3.5–5.1)
Sodium: 137 mmol/L (ref 135–145)

## 2017-02-15 LAB — MRSA PCR SCREENING: MRSA by PCR: NEGATIVE

## 2017-02-15 LAB — PHOSPHORUS: Phosphorus: 2.6 mg/dL (ref 2.5–4.6)

## 2017-02-15 LAB — MAGNESIUM: Magnesium: 2 mg/dL (ref 1.7–2.4)

## 2017-02-15 LAB — BRAIN NATRIURETIC PEPTIDE: B NATRIURETIC PEPTIDE 5: 24 pg/mL (ref 0.0–100.0)

## 2017-02-15 LAB — TROPONIN I

## 2017-02-15 MED ORDER — PANTOPRAZOLE SODIUM 40 MG PO TBEC
40.0000 mg | DELAYED_RELEASE_TABLET | Freq: Every day | ORAL | Status: DC
  Administered 2017-02-16 – 2017-02-17 (×2): 40 mg via ORAL
  Filled 2017-02-15 (×2): qty 1

## 2017-02-15 MED ORDER — ALBUTEROL SULFATE (2.5 MG/3ML) 0.083% IN NEBU: 2.5000 mg | INHALATION_SOLUTION | RESPIRATORY_TRACT | Status: DC | PRN

## 2017-02-15 MED ORDER — ONDANSETRON HCL 4 MG PO TABS: 4.0000 mg | ORAL_TABLET | Freq: Four times a day (QID) | ORAL | Status: DC | PRN

## 2017-02-15 MED ORDER — ENSURE ENLIVE PO LIQD
237.0000 mL | Freq: Two times a day (BID) | ORAL | Status: DC
  Administered 2017-02-16 – 2017-02-17 (×2): 237 mL via ORAL

## 2017-02-15 MED ORDER — TAMSULOSIN HCL 0.4 MG PO CAPS
0.4000 mg | ORAL_CAPSULE | Freq: Every day | ORAL | Status: DC
  Administered 2017-02-16 – 2017-02-17 (×2): 0.4 mg via ORAL
  Filled 2017-02-15 (×2): qty 1

## 2017-02-15 MED ORDER — MEMANTINE HCL ER 28 MG PO CP24
28.0000 mg | ORAL_CAPSULE | Freq: Every day | ORAL | Status: DC
  Administered 2017-02-16: 28 mg via ORAL
  Filled 2017-02-15 (×5): qty 1

## 2017-02-15 MED ORDER — ASPIRIN 325 MG PO TABS
325.0000 mg | ORAL_TABLET | Freq: Every day | ORAL | Status: DC
  Administered 2017-02-16 – 2017-02-17 (×2): 325 mg via ORAL
  Filled 2017-02-15 (×2): qty 1

## 2017-02-15 MED ORDER — ATORVASTATIN CALCIUM 40 MG PO TABS
40.0000 mg | ORAL_TABLET | Freq: Every day | ORAL | Status: DC
  Administered 2017-02-15 – 2017-02-16 (×2): 40 mg via ORAL
  Filled 2017-02-15 (×2): qty 1

## 2017-02-15 MED ORDER — ENSURE ENLIVE PO LIQD: 237.0000 mL | Freq: Two times a day (BID) | ORAL | Status: DC

## 2017-02-15 MED ORDER — CITALOPRAM HYDROBROMIDE 20 MG PO TABS
10.0000 mg | ORAL_TABLET | Freq: Every day | ORAL | Status: DC
  Administered 2017-02-16 – 2017-02-17 (×2): 10 mg via ORAL
  Filled 2017-02-15 (×2): qty 1

## 2017-02-15 MED ORDER — IPRATROPIUM-ALBUTEROL 0.5-2.5 (3) MG/3ML IN SOLN
3.0000 mL | Freq: Four times a day (QID) | RESPIRATORY_TRACT | Status: DC
  Administered 2017-02-15 – 2017-02-17 (×5): 3 mL via RESPIRATORY_TRACT
  Filled 2017-02-15 (×5): qty 3

## 2017-02-15 MED ORDER — DOXYCYCLINE HYCLATE 100 MG PO TABS
100.0000 mg | ORAL_TABLET | Freq: Two times a day (BID) | ORAL | Status: DC
  Administered 2017-02-15 – 2017-02-17 (×4): 100 mg via ORAL
  Filled 2017-02-15 (×4): qty 1

## 2017-02-15 MED ORDER — SODIUM CHLORIDE 0.9 % IV SOLN
INTRAVENOUS | Status: DC
  Administered 2017-02-15 – 2017-02-17 (×3): via INTRAVENOUS

## 2017-02-15 MED ORDER — SODIUM CHLORIDE 0.9 % IV BOLUS (SEPSIS)
500.0000 mL | Freq: Once | INTRAVENOUS | Status: AC
  Administered 2017-02-15: 500 mL via INTRAVENOUS

## 2017-02-15 MED ORDER — SACCHAROMYCES BOULARDII 250 MG PO CAPS
250.0000 mg | ORAL_CAPSULE | Freq: Two times a day (BID) | ORAL | Status: DC
  Administered 2017-02-15 – 2017-02-17 (×4): 250 mg via ORAL
  Filled 2017-02-15 (×4): qty 1

## 2017-02-15 MED ORDER — ONDANSETRON HCL 4 MG/2ML IJ SOLN: 4.0000 mg | Freq: Four times a day (QID) | INTRAMUSCULAR | Status: DC | PRN

## 2017-02-15 MED ORDER — LEVETIRACETAM 250 MG PO TABS
250.0000 mg | ORAL_TABLET | Freq: Two times a day (BID) | ORAL | Status: DC
  Administered 2017-02-15 – 2017-02-17 (×4): 250 mg via ORAL
  Filled 2017-02-15 (×4): qty 1

## 2017-02-15 MED ORDER — DIPHENHYDRAMINE HCL 25 MG PO CAPS
25.0000 mg | ORAL_CAPSULE | Freq: Every evening | ORAL | Status: DC | PRN
  Administered 2017-02-15: 25 mg via ORAL
  Filled 2017-02-15: qty 1

## 2017-02-15 MED ORDER — IPRATROPIUM BROMIDE 0.02 % IN SOLN
1.0000 mg | Freq: Once | RESPIRATORY_TRACT | Status: AC
  Administered 2017-02-15: 1 mg via RESPIRATORY_TRACT
  Filled 2017-02-15: qty 5

## 2017-02-15 MED ORDER — DARIFENACIN HYDROBROMIDE ER 7.5 MG PO TB24
7.5000 mg | ORAL_TABLET | Freq: Every day | ORAL | Status: DC
  Administered 2017-02-16 – 2017-02-17 (×2): 7.5 mg via ORAL
  Filled 2017-02-15 (×2): qty 1

## 2017-02-15 MED ORDER — ACETAMINOPHEN 650 MG RE SUPP: 650.0000 mg | Freq: Four times a day (QID) | RECTAL | Status: DC | PRN

## 2017-02-15 MED ORDER — METHYLPREDNISOLONE SODIUM SUCC 125 MG IJ SOLR
60.0000 mg | Freq: Three times a day (TID) | INTRAMUSCULAR | Status: DC
  Administered 2017-02-15 – 2017-02-17 (×5): 60 mg via INTRAVENOUS
  Filled 2017-02-15 (×5): qty 2

## 2017-02-15 MED ORDER — ALBUTEROL (5 MG/ML) CONTINUOUS INHALATION SOLN
10.0000 mg/h | INHALATION_SOLUTION | Freq: Once | RESPIRATORY_TRACT | Status: AC
  Administered 2017-02-15: 10 mg/h via RESPIRATORY_TRACT
  Filled 2017-02-15: qty 20

## 2017-02-15 MED ORDER — BUDESONIDE 0.5 MG/2ML IN SUSP
0.5000 mg | Freq: Two times a day (BID) | RESPIRATORY_TRACT | Status: DC
  Administered 2017-02-15 – 2017-02-17 (×4): 0.5 mg via RESPIRATORY_TRACT
  Filled 2017-02-15 (×4): qty 2

## 2017-02-15 MED ORDER — ACETAMINOPHEN 325 MG PO TABS
650.0000 mg | ORAL_TABLET | Freq: Four times a day (QID) | ORAL | Status: DC | PRN
  Administered 2017-02-16: 650 mg via ORAL
  Filled 2017-02-15: qty 2

## 2017-02-15 MED ORDER — METHYLPREDNISOLONE SODIUM SUCC 125 MG IJ SOLR
125.0000 mg | Freq: Once | INTRAMUSCULAR | Status: AC
  Administered 2017-02-15: 125 mg via INTRAVENOUS
  Filled 2017-02-15: qty 2

## 2017-02-15 NOTE — ED Triage Notes (Signed)
Pt's daughter reports pt has had cough, wheezing, and sob since last Saturday.  Reports went to Urgent care last Saturday and was put on bactrim.  Pt says is no better.

## 2017-02-15 NOTE — Telephone Encounter (Signed)
Patient's daughter called and cancel her appts, she is being seen in the ED for SOB

## 2017-02-15 NOTE — ED Notes (Signed)
Pt has wound to left forearm, left elbow, and left shin.  Pt goes to wound care center for care.  Wounds are dressed.  Swelling noted to left lower leg.

## 2017-02-15 NOTE — H&P (Signed)
History and Physical    Bridget Ball YPP:[id number] DOB: 03-23-1928 DOA: 02/15/2017  PCP: Celene Squibb, MD   I have briefly reviewed patients previous medical reports in [facility]  Patient coming from: Home  Chief Complaint: Shortness of breath, productive cough, increased wheezing  HPI: Bridget Ball is a 84 year old female with a past medical history significant for anxiety, arthritis, COPD, dementia, depression, GERD, hyperlipidemia, seizures and SAH; who presented to the ED with increased shortness of breath, productive cough and increased wheezing.  She reports that her symptom has been present for over a week now and worsening.  She was seen at urgent care about 4-5 days prior to admission and prescribed Bactrim without improvement in her condition. Patient denies fever (but is present chills), no nausea, no vomiting, no chest pain, no hemoptysis, no abdominal pain, no hematuria, hematochezia, melena, or any other acute complaints.  Of note, patient expressed decreased appetite.  ED Course: Hypoxic on room air (85%), chest x-ray with bronchitis and emphysematous changes; nebulizer treatment, Solu-Medrol and oxygen supplementation provided NAD.  Patient also given gentle IV fluids.  TRH consulted to admit the patient for further evaluation and treatment.  Review of Systems:  All other systems reviewed and apart from HPI, are negative.  Past Medical History:  Diagnosis Date  . Anxiety   . Arthritis   . Attention to nephrostomy ([place])    PT HAS NEPHROSTOMY TUBE IN PLACE  . Bruises easily   . Cancer [facility])    Skin cancer  . Chronic diarrhea   . Colitis   . COPD (chronic obstructive pulmonary disease) ([place])   . Dementia   . Depression   . GERD (gastroesophageal reflux disease)   . Hiatal hernia   . Hx of bronchitis   . Hx of pulmonary edema JULY 2015  . Hx of pulmonary edema JULY 2015  . Hx of septic shock JULY 2015  . Hydronephrosis, left   . Hyperlipidemia   .  Memory difficulties   . Microscopic colitis   . Myocardial infarction [facility])    unknown time   . Renal insufficiency   . Seizures ([place])    last seizure was 4-5 years ago with "brain bleed"  . Shortness of breath    with exertion  . Stroke [facility])    TIA four years ago / stroke NOV 2014    Past Surgical History:  Procedure Laterality Date  . APPENDECTOMY    . BREAST BIOPSY Right 02/02/2014   Procedure: RIGHT BREAST BIOPSY;  Surgeon: Jamesetta So, MD;  Location: [facility] ORS;  Service: General;  Laterality: Right;  . COLONOSCOPY N/A 05/24/2013   Procedure: COLONOSCOPY;  Surgeon: Rogene Houston, MD;  Location: [facility] ENDO SUITE;  Service: Endoscopy;  Laterality: N/A;  100  . CYSTOSCOPY W/ URETERAL STENT PLACEMENT Left 08/06/2015   Procedure: CYSTOSCOPY WITH LEFT URETERAL STENT EXCHANGE - Sammie Bench;  Surgeon: Franchot Gallo, MD;  Location: [facility] ORS;  Service: Urology;  Laterality: Left;  . CYSTOSCOPY W/ URETERAL STENT PLACEMENT Left 07/07/2016   Procedure: CYSTOSCOPY, LEFT RETROGRADE PYELOGRAM WITH LEFT URETERAL STENT REPLACEMENT;  Surgeon: Franchot Gallo, MD;  Location: [facility] ORS;  Service: Urology;  Laterality: Left;  . CYSTOSCOPY WITH RETROGRADE PYELOGRAM, URETEROSCOPY AND STENT PLACEMENT Left 09/18/2013   Procedure: CYSTOSCOPY WITH RETROGRADE PYELOGRAM,  URETEROSCOPY AND  STENT PLACEMENT, removal of nephrostomy tube;  Surgeon: Jorja Loa, MD;  Location: [facility] OR[facility];  Service: Urology;  Laterality: Left;  . CYSTOSCOPY WITH  STENT PLACEMENT Left 09/04/2014   Procedure: CYSTOSCOPY WITH LEFT J2 STENT EXCHANGE;  Surgeon: Franchot Gallo, MD;  Location: [facility] ORS;  Service: Urology;  Laterality: Left;  . ENDOVASCULAR REPAIR OF POPLITEAL ARTERY ANEURYSM  02/05/2016  . ESOPHAGOGASTRODUODENOSCOPY N/A 05/24/2013   Procedure: ESOPHAGOGASTRODUODENOSCOPY (EGD);  Surgeon: Rogene Houston, MD;  Location: [facility] ENDO SUITE;  Service: Endoscopy;  Laterality: N/A;  . FALSE ANEURYSM REPAIR Right 02/05/2016   Procedure:  REPAIR RIGHT POLITEAL  ANEURYSM;  Surgeon: Serafina Mitchell, MD;  Location: [place];  Service: Vascular;  Laterality: Right;  . KNEE ARTHROSCOPY Right   . SKIN CANCER EXCISION    . TONSILLECTOMY    . TUBAL LIGATION      Social History  reports that she quit smoking about 19 years ago. Her smoking use included cigarettes. She has a 50.00 pack-year smoking history. she has never used smokeless tobacco. She reports that she drinks about 4.2 oz of alcohol per week. She reports that she does not use drugs.  Allergies  Allergen Reactions  . Penicillins Anaphylaxis    Throat swelling as a child. Tolerated ceftriaxone 07/29/13.     Family History  Problem Relation Age of Onset  . Diabetes Mother   . Stroke Mother   . Heart attack Father     Prior to Admission medications   Medication Sig Start Date End Date Taking? Authorizing Provider  albuterol (PROVENTIL HFA;VENTOLIN HFA) 108 (90 BASE) MCG/ACT inhaler Inhale 2 puffs into the lungs every 6 (six) hours as needed for wheezing or shortness of breath. 08/03/13  Yes Short, Noah Delaine, MD  albuterol (PROVENTIL) (2.5 MG/3ML) 0.083% nebulizer solution Take 2.5 mg by nebulization 2 (two) times daily.  11/11/16  Yes [provider]  aspirin 325 MG tablet Take 325 mg by mouth daily.   Yes [provider]  atorvastatin (LIPITOR) 80 MG tablet Take 40 mg by mouth daily at 6 PM.  12/17/16  Yes [provider]  budesonide (ENTOCORT EC) 3 MG 24 hr capsule Take 6 mg by mouth daily as needed (for severe diarrhea).   Yes [provider]  budesonide-formoterol (SYMBICORT) 160-4.5 MCG/ACT inhaler Inhale 2 puffs into the lungs 2 (two) times daily. 06/13/14  Yes Samuella Cota, MD  citalopram (CELEXA) 10 MG tablet Take 1 tablet (10 mg total) by mouth daily. 05/05/16  Yes Garvin Fila, MD  docusate sodium (COLACE) 100 MG capsule Take 200 mg by mouth daily as needed for mild constipation.    Yes [provider]  feeding  supplement, ENSURE ENLIVE, (ENSURE ENLIVE) LIQD Take 237 mLs by mouth 2 (two) times daily between meals. 12/22/15  Yes Hosie Poisson, MD  Ferrous Sulfate 27 MG TABS Take 27 mg by mouth daily.   Yes [provider]  levETIRAcetam (KEPPRA) 250 MG tablet Take 1 tablet (250 mg total) by mouth 2 (two) times daily. 10/14/16  Yes Rai, Ripudeep K, MD  memantine (NAMENDA XR) 28 MG CP24 24 hr capsule Take 1 capsule (28 mg total) by mouth daily. 09/08/16  Yes Garvin Fila, MD  pantoprazole (PROTONIX) 40 MG tablet Take 1 tablet (40 mg total) by mouth every other day. Patient taking differently: Take 40 mg by mouth daily.  10/06/16  Yes Rehman, Mechele Dawley, MD  Potassium 99 MG TABS Take 99 mg by mouth daily.   Yes [provider]  SPIRIVA HANDIHALER 18 MCG inhalation capsule Place 1 capsule into inhaler and inhale daily. 05/20/14  Yes [provider]  tamsulosin (FLOMAX) 0.4 MG CAPS capsule Take 1 capsule (0.4 mg total) by mouth daily. 10/15/16  Yes Rai, Ripudeep K, MD  VESICARE 10 MG tablet Take 5 mg by mouth daily.  10/06/16  Yes [provider]    Physical Exam: Vitals:   02/15/17 1400 02/15/17 1430 02/15/17 1500 02/15/17 1630  BP: (!) 89/64 95/62 95/63    Pulse: (!) 130 (!) 127 (!) 116   Resp: 17 16 19    Temp:    98 F (36.7 C)  TempSrc:      SpO2: (!) 85%  98%   Weight:    41.4 kg (91 lb 4.3 oz)  Height:    5\' 1"  (1.549 m)   Constitutional: With mild respiratory distress requiring oxygen supplementation to maintain O2 sat above 90%. Tachypnea and difficulty speaking in full sentences appreciated on exam.  Patient denies chest pain, nausea, vomiting, orthopnea, abdominal pain.  Afebrile on exam. Eyes: PERTLA, lids and conjunctivae normal, no icterus, no nystagmus. ENMT: Mucous membranes are moist. Posterior pharynx clear of any exudate or lesions. No thrush Neck: supple, no masses, no thyromegaly, no JVD Respiratory: Positive tachypnea, no using accessory muscles;  diffuse expiratory wheezing and positive rhonchi.  No crackles Cardiovascular: tachycardic, no rubs, no gallops, no murmur. Abdomen: No distension, no tenderness, no masses palpated. No hepatosplenomegaly. Bowel sounds normal.  Musculoskeletal: no clubbing / cyanosis. No joint deformity upper and lower extremities. Poor ROM (especially in her shoulders); no contractures.  Skin: Chronic open wounds affecting her left leg and left upper extremity, clean dressings in place, no significant swelling or signs of superimposed infection/drainage.  Patient also with open wounds in her lower back and upper back that appears to be due to scratching; no signs of infection appreciated on these wounds either. Neurologic: CN 2-12 grossly intact. Sensation intact. Strength 3/5 in all 4 limbs (arms more than legs; has chronic shoulder problems and difficulty using her arms).  Psychiatric: Able to follow simple commands, patient oriented x2, short memory deficit appreciated on exam.  No hallucinations and normal mood.    Labs on Admission: I have personally reviewed following labs and imaging studies  CBC: Recent Labs  Lab 02/15/17 0927  WBC 6.0  NEUTROABS 4.6  HGB 11.9*  HCT 37.4  MCV 96.1  PLT 378   Basic Metabolic Panel: Recent Labs  Lab 02/15/17 0927  NA 137  K 4.2  CL 100*  CO2 26  GLUCOSE 90  BUN 14  CREATININE 1.07*  CALCIUM 9.0   Cardiac Enzymes: Recent Labs  Lab 02/15/17 0927  TROPONINI <0.03   Urine analysis:    Component Value Date/Time   COLORURINE YELLOW 10/13/2016 0309   APPEARANCEUR HAZY (A) 10/13/2016 0309   LABSPEC 1.019 10/13/2016 0309   PHURINE 6.0 10/13/2016 0309   GLUCOSEU NEGATIVE 10/13/2016 0309   HGBUR SMALL (A) 10/13/2016 0309   BILIRUBINUR NEGATIVE 10/13/2016 0309   KETONESUR 5 (A) 10/13/2016 0309   PROTEINUR 30 (A) 10/13/2016 0309   UROBILINOGEN 0.2 07/26/2013 2234   NITRITE NEGATIVE 10/13/2016 0309   LEUKOCYTESUR LARGE (A) 10/13/2016 0309    Radiological Exams on Admission: Dg Chest Port 1 View  Result Date: 02/15/2017 CLINICAL DATA:  Cough, wheezing, shortness of breath for 1 week EXAM: PORTABLE CHEST 1 VIEW COMPARISON:  10/13/2016 FINDINGS: The lungs are hyperinflated likely secondary to COPD. There is no focal parenchymal opacity. There are trace bilateral pleural effusions. There is no pneumothorax. The heart and mediastinal contours are unremarkable. There is mild  osteoarthritis of the right glenohumeral joint. There is no acute osseous abnormality. IMPRESSION: Trace bilateral pleural effusions. COPD. Electronically Signed   By: Kathreen Devoid   On: 02/15/2017 09:52    EKG:  Low-voltage EKG, sinus tachycardia, no acute ischemic changes appreciated.  Positive poor R wave progression.  Assessment/Plan 1-Acute respiratory failure with hypoxia ([place]): In the setting of acute COPD exacerbation with bronchitis/bronchiectasis. -Patient presented with O2 sat of 85% on room air and experiencing significant tachypnea. -will start treatment with solumedrol, Duoneb, doxycycline and pulmicort  -oxygen supplementation as needed  -started on flutter valve  -will follow clinical response   2-Seizure disorder secondary to prior SAH -continue Keppra -no recent seizure activity reported   3-GERD (gastroesophageal reflux disease) -continue PPI  4-Severe protein-calorie malnutrition ([place]) -nutritional service consulted  -will use ensure BID as feeding supplement   5-Leg wound, left -will request wound care service evaluation for continuation of treatment -no signs of superimposed infection currently and clean dressings in place.   6-HLD -continue lipitor  7-hx of dementia: with lost of short term memory -continue Namenda  -continue supportive care   8-history of TIA and prior SAH -continue aspirin for secondary prevention -will avoid use of heparin products as per family/patient request  Time: 65 minutes    DVT  prophylaxis: SCDs Code Status: Full code Family Communication: Daughter (POA) at bedside Disposition Plan: Anticipate discharge back home once medically stable and with improved breathing. Consults called: None Admission status: Inpatient, LOS more than 2 midnights; stepdown bed.   Barton Dubois MD [facility] Pager [phone]  If 7PM-7AM, please contact night-coverage [email or web address] Password [facility]  02/15/2017, 5:40 PM

## 2017-02-15 NOTE — ED Notes (Signed)
EKG given to Dr. McManus.  

## 2017-02-15 NOTE — ED Provider Notes (Signed)
[facility] EMERGENCY DEPARTMENT Provider Note   CSN: [id number] Arrival date & time: 02/15/17  0854     History   Chief Complaint Chief Complaint  Patient presents with  . Shortness of Breath    HPI Bridget Ball is a 84 y.o. female.  The history is provided by the patient and a relative. The history is limited by the condition of the patient (Hx dementia).  Shortness of Breath     Pt was seen at 0900.  Per pt and her family, c/o gradual onset and worsening of persistent cough, wheezing and SOB for the past 1 week. Pt was evaluated at [facility] 1 week ago, rx abx without improvement.  Has been using home nebs without improvement.  Denies CP/palpitations, no back pain, no abd pain, no N/V/D, no fevers, no rash.    Past Medical History:  Diagnosis Date  . Anxiety   . Arthritis   . Attention to nephrostomy ([place])    PT HAS NEPHROSTOMY TUBE IN PLACE  . Bruises easily   . Cancer [facility])    Skin cancer  . Chronic diarrhea   . Colitis   . COPD (chronic obstructive pulmonary disease) ([place])   . Dementia   . Depression   . GERD (gastroesophageal reflux disease)   . Hiatal hernia   . Hx of bronchitis   . Hx of pulmonary edema JULY 2015  . Hx of pulmonary edema JULY 2015  . Hx of septic shock JULY 2015  . Hydronephrosis, left   . Hyperlipidemia   . Memory difficulties   . Microscopic colitis   . Myocardial infarction [facility])    unknown time   . Renal insufficiency   . Seizures ([place])    last seizure was 4-5 years ago with "brain bleed"  . Shortness of breath    with exertion  . Stroke [facility])    TIA four years ago / stroke NOV 2014    Patient Active Problem List   Diagnosis Date Noted  . History of stroke 10/20/2016  . TIA (transient ischemic attack) 10/12/2016  . Acute urinary retention 10/12/2016  . Popliteal artery aneurysm ([place]) 02/05/2016  . Malnutrition of moderate degree 12/15/2015  . DVT of popliteal vein ([place]) 12/14/2015  . Aneurysm of left internal iliac artery  ([place]) 12/14/2015  . Sepsis affecting skin ([place]) 12/13/2015  . Dementia 12/13/2015  . GERD (gastroesophageal reflux disease) 12/13/2015  . Severe protein-calorie malnutrition ([place]) 12/13/2015  . Cellulitis of right leg 11/24/2015  . Dyslipidemia 11/24/2015  . Erythema of lower extremity 11/24/2015  . Anemia 11/01/2015  . Esophageal reflux 08/17/2013  . COPD (chronic obstructive pulmonary disease) ([place]) 08/11/2013  . NSTEMI (non-ST elevated myocardial infarction) type 2 07/26/2013  . Dehydration 07/25/2013  . AKI (acute kidney injury) ([place]) 07/25/2013  . Colitis 07/08/2013  . Hydronephrosis, left 07/08/2013  . Microscopic colitis 06/27/2013  . Memory loss 04/25/2013  . Depression 04/25/2013  . SAH (subarachnoid hemorrhage) ([place]) 11/22/2012  . Seizure disorder secondary to prior [facility] 11/22/2012    Past Surgical History:  Procedure Laterality Date  . APPENDECTOMY    . BREAST BIOPSY Right 02/02/2014   Procedure: RIGHT BREAST BIOPSY;  Surgeon: Jamesetta So, MD;  Location: [facility] ORS;  Service: General;  Laterality: Right;  . COLONOSCOPY N/A 05/24/2013   Procedure: COLONOSCOPY;  Surgeon: Rogene Houston, MD;  Location: [facility] ENDO SUITE;  Service: Endoscopy;  Laterality: N/A;  100  . CYSTOSCOPY W/ URETERAL STENT PLACEMENT Left 08/06/2015  Procedure: CYSTOSCOPY WITH LEFT URETERAL STENT EXCHANGE - Sammie Bench;  Surgeon: Franchot Gallo, MD;  Location: [facility] ORS;  Service: Urology;  Laterality: Left;  . CYSTOSCOPY W/ URETERAL STENT PLACEMENT Left 07/07/2016   Procedure: CYSTOSCOPY, LEFT RETROGRADE PYELOGRAM WITH LEFT URETERAL STENT REPLACEMENT;  Surgeon: Franchot Gallo, MD;  Location: [facility] ORS;  Service: Urology;  Laterality: Left;  . CYSTOSCOPY WITH RETROGRADE PYELOGRAM, URETEROSCOPY AND STENT PLACEMENT Left 09/18/2013   Procedure: CYSTOSCOPY WITH RETROGRADE PYELOGRAM,  URETEROSCOPY AND  STENT PLACEMENT, removal of nephrostomy tube;  Surgeon: Jorja Loa, MD;  Location: [facility] ORS;  Service: Urology;   Laterality: Left;  . CYSTOSCOPY WITH STENT PLACEMENT Left 09/04/2014   Procedure: CYSTOSCOPY WITH LEFT J2 STENT EXCHANGE;  Surgeon: Franchot Gallo, MD;  Location: [facility] ORS;  Service: Urology;  Laterality: Left;  . ENDOVASCULAR REPAIR OF POPLITEAL ARTERY ANEURYSM  02/05/2016  . ESOPHAGOGASTRODUODENOSCOPY N/A 05/24/2013   Procedure: ESOPHAGOGASTRODUODENOSCOPY (EGD);  Surgeon: Rogene Houston, MD;  Location: [facility] ENDO SUITE;  Service: Endoscopy;  Laterality: N/A;  . FALSE ANEURYSM REPAIR Right 02/05/2016   Procedure: REPAIR RIGHT POLITEAL  ANEURYSM;  Surgeon: Serafina Mitchell, MD;  Location: [place];  Service: Vascular;  Laterality: Right;  . KNEE ARTHROSCOPY Right   . SKIN CANCER EXCISION    . TONSILLECTOMY    . TUBAL LIGATION      OB History    Gravida Para Term Preterm AB Living   5 5 5     5    SAB TAB Ectopic Multiple Live Births                   Home Medications    Prior to Admission medications   Medication Sig Start Date End Date Taking? Authorizing Provider  albuterol (PROVENTIL HFA;VENTOLIN HFA) 108 (90 BASE) MCG/ACT inhaler Inhale 2 puffs into the lungs every 6 (six) hours as needed for wheezing or shortness of breath. 08/03/13   Janece Canterbury, MD  albuterol (PROVENTIL) (2.5 MG/3ML) 0.083% nebulizer solution  11/11/16   [provider]  aspirin 325 MG tablet Take 325 mg by mouth daily.    [provider]  atorvastatin (LIPITOR) 80 MG tablet Take 40 mg by mouth daily at 6 PM.  12/17/16   [provider]  budesonide-formoterol (SYMBICORT) 160-4.5 MCG/ACT inhaler Inhale 2 puffs into the lungs 2 (two) times daily. 06/13/14   Samuella Cota, MD  citalopram (CELEXA) 10 MG tablet Take 1 tablet (10 mg total) by mouth daily. Patient not taking: Reported on 01/22/2017 05/05/16   Garvin Fila, MD  cyclobenzaprine (FLEXERIL) 5 MG tablet  11/11/16   [provider]  docusate sodium (COLACE) 100 MG capsule Take 200 mg by mouth daily.     [provider]  feeding supplement, ENSURE ENLIVE, (ENSURE ENLIVE) LIQD Take 237 mLs by mouth 2 (two) times daily between meals. 12/22/15   Hosie Poisson, MD  Ferrous Sulfate 27 MG TABS Take 27 mg by mouth daily.    [provider]  HYDROcodone-acetaminophen (NORCO/VICODIN) 5-325 MG tablet  11/11/16   [provider]  levETIRAcetam (KEPPRA) 250 MG tablet Take 1 tablet (250 mg total) by mouth 2 (two) times daily. Patient not taking: Reported on 01/22/2017 10/14/16   Rai, Vernelle Emerald, MD  meloxicam (MOBIC) 7.5 MG tablet Take 7.5 mg by mouth daily.    [provider]  memantine (NAMENDA XR) 28 MG CP24 24 hr capsule Take 1 capsule (28 mg total) by mouth daily. Patient not  taking: Reported on 01/22/2017 09/08/16   Garvin Fila, MD  Methylfol-Algae-B12-Acetylcyst (CEREFOLIN NAC) 6-90.314-2-600 MG TABS Take 1 tablet by mouth daily. 12/02/15   Garvin Fila, MD  pantoprazole (PROTONIX) 40 MG tablet Take 1 tablet (40 mg total) by mouth every other day. Patient not taking: Reported on 01/22/2017 10/06/16   Rogene Houston, MD  Potassium 99 MG TABS Take 99 mg by mouth daily.    [provider]  SPIRIVA HANDIHALER 18 MCG inhalation capsule Place 1 capsule into inhaler and inhale daily. 05/20/14   [provider]  tamsulosin (FLOMAX) 0.4 MG CAPS capsule Take 1 capsule (0.4 mg total) by mouth daily. Patient not taking: Reported on 01/22/2017 10/15/16   Mendel Corning, MD  VESICARE 10 MG tablet  10/06/16   [provider]    Family History Family History  Problem Relation Age of Onset  . Diabetes Mother   . Stroke Mother   . Heart attack Father     Social History Social History   Tobacco Use  . Smoking status: Former Smoker    Packs/day: 1.00    Years: 50.00    Pack years: 50.00    Types: Cigarettes    Last attempt to quit: 11/13/1997    Years since quitting: 19.2  . Smokeless tobacco: Never Used  Substance Use Topics  . Alcohol use: Yes     Alcohol/week: 4.2 oz    Types: 7 Glasses of wine per week    Comment: 2 glasses a night wine (none in a year)  . Drug use: No     Allergies   Penicillins   Review of Systems Review of Systems  Unable to perform ROS: Dementia  Respiratory: Positive for shortness of breath.      Physical Exam Updated Vital Signs BP 98/81 (BP Location: Right Arm)   Pulse 90   Temp 98.1 F (36.7 C) (Oral)   Resp (!) 21   Ht 5\' 1"  (1.549 m)   Wt 42.6 kg (94 lb)   SpO2 98%   BMI 17.76 kg/m    Patient Vitals for the past 24 hrs:  BP Temp Temp src Pulse Resp SpO2 Height Weight  02/15/17 1500 95/63 - - (!) 116 19 98 % - -  02/15/17 1430 95/62 - - (!) 127 16 - - -  02/15/17 1400 (!) 89/64 - - (!) 130 17 (!) 85 % - -  02/15/17 1215 - - - 91 14 95 % - -  02/15/17 1207 - - - - - 94 % - -  02/15/17 1100 94/66 - - (!) 48 14 94 % - -  02/15/17 0930 (!) 68/35 - - 90 20 92 % - -  02/15/17 0916 98/81 98.1 F (36.7 C) Oral 90 - 98 % - -  02/15/17 0909 - - - - (!) 21 (!) 87 % - -  02/15/17 0904 - - - - - - 5\' 1"  (1.549 m) 42.6 kg (94 lb)     Physical Exam 0905: Physical examination:  Nursing notes reviewed; Vital signs and O2 SAT reviewed;  Constitutional: Well developed, Well nourished, Well hydrated, Uncomfortable appearing.;; Head:  Normocephalic, atraumatic; Eyes: EOMI, PERRL, No scleral icterus; ENMT: Mouth and pharynx normal, Mucous membranes moist; Neck: Supple, Full range of motion, No lymphadenopathy; Cardiovascular: Tachycardic rate and irregular rhythm, No gallop; Respiratory: Breath sounds diminished & equal bilaterally, insp/exp wheezes bilat. No audible wheezing.  Speaking short sentences, sitting upright, tachypneic.; Chest: Nontender, Movement normal;  Abdomen: Soft, Nontender, Nondistended, Normal bowel sounds; Genitourinary: No CVA tenderness; Extremities: Pulses normal, No tenderness, +1 LLE edema with DSD and stocking in place (hx wound). No RLE edema.; Neuro: Awake, alert, confused per  hx dementia. Major CN grossly intact.  Speech clear. No gross focal motor or sensory deficits in extremities.; Skin: Color normal, Warm, Dry.   ED Treatments / Results  Labs (all labs ordered are listed, but only abnormal results are displayed)   EKG  EKG Interpretation  Date/Time:  Monday February 15 2017 09:15:55 EST Ventricular Rate:  95 PR Interval:    QRS Duration: 103 QT Interval:  377 QTC Calculation: 474 R Axis:   61 Text Interpretation:  Atrial flutter/fibrillation Anteroseptal infarct, age indeterminate Baseline wander Artifact When compared with ECG of 10/13/2016 Baseline wander is present Otherwise no significant change Confirmed by Francine Graven [phone]) on 02/15/2017 9:30:35 AM       EKG Interpretation  Date/Time:  Monday February 15 2017 15:03:59 EST Ventricular Rate:  111 PR Interval:    QRS Duration: 107 QT Interval:  345 QTC Calculation: 469 R Axis:   -83 Text Interpretation:  Sinus tachycardia LAD, consider left anterior fascicular block Low voltage, extremity leads Since last tracing of earlier today Sinus tachycardia has replaced Atrial fibrillation Confirmed by Francine Graven [phone]) on 02/15/2017 3:15:11 PM         Radiology   Procedures Procedures (including critical care time)  Medications Ordered in ED Medications - No data to display   Initial Impression / Assessment and Plan / ED Course  I have reviewed the triage vital signs and the nursing notes.  Pertinent labs & imaging results that were available during my care of the patient were reviewed by me and considered in my medical decision making (see chart for details).  MDM Reviewed: previous chart, nursing note and vitals Reviewed previous: labs and ECG Interpretation: labs, ECG and x-ray Total time providing critical care: 30-74 minutes. This excludes time spent performing separately reportable procedures and services. Consults: admitting MD   CRITICAL CARE Performed by:  Alfonzo Feller Total critical care time: 35 minutes Critical care time was exclusive of separately billable procedures and treating other patients. Critical care was necessary to treat or prevent imminent or life-threatening deterioration. Critical care was time spent personally by me on the following activities: development of treatment plan with patient and/or surrogate as well as nursing, discussions with consultants, evaluation of patient's response to treatment, examination of patient, obtaining history from patient or surrogate, ordering and performing treatments and interventions, ordering and review of laboratory studies, ordering and review of radiographic studies, pulse oximetry and re-evaluation of patient's condition.   Results for orders placed or performed during the hospital encounter of 11/94/17  Basic metabolic panel  Result Value Ref Range   Sodium 137 135 - 145 mmol/L   Potassium 4.2 3.5 - 5.1 mmol/L   Chloride 100 (L) 101 - 111 mmol/L   CO2 26 22 - 32 mmol/L   Glucose, Bld 90 65 - 99 mg/dL   BUN 14 6 - 20 mg/dL   Creatinine, Ser 1.07 (H) 0.44 - 1.00 mg/dL   Calcium 9.0 8.9 - 10.3 mg/dL   GFR calc non Af Amer 46 (L) >60 mL/min   GFR calc Af Amer 53 (L) >60 mL/min   Anion gap 11 5 - 15  Brain natriuretic peptide  Result Value Ref Range   B Natriuretic Peptide 24.0 0.0 - 100.0 pg/mL  Troponin I  Result  Value Ref Range   Troponin I <0.03 <0.03 ng/mL  CBC with Differential  Result Value Ref Range   WBC 6.0 4.0 - 10.5 K/uL   RBC 3.89 3.87 - 5.11 MIL/uL   Hemoglobin 11.9 (L) 12.0 - 15.0 g/dL   HCT 37.4 36.0 - 46.0 %   MCV 96.1 78.0 - 100.0 fL   MCH 30.6 26.0 - 34.0 pg   MCHC 31.8 30.0 - 36.0 g/dL   RDW 15.1 11.5 - 15.5 %   Platelets 273 150 - 400 K/uL   Neutrophils Relative % 77 %   Neutro Abs 4.6 1.7 - 7.7 K/uL   Lymphocytes Relative 13 %   Lymphs Abs 0.8 0.7 - 4.0 K/uL   Monocytes Relative 8 %   Monocytes Absolute 0.5 0.1 - 1.0 K/uL   Eosinophils  Relative 2 %   Eosinophils Absolute 0.1 0.0 - 0.7 K/uL   Basophils Relative 0 %   Basophils Absolute 0.0 0.0 - 0.1 K/uL   Dg Chest Port 1 View Result Date: 02/15/2017 CLINICAL DATA:  Cough, wheezing, shortness of breath for 1 week EXAM: PORTABLE CHEST 1 VIEW COMPARISON:  10/13/2016 FINDINGS: The lungs are hyperinflated likely secondary to COPD. There is no focal parenchymal opacity. There are trace bilateral pleural effusions. There is no pneumothorax. The heart and mediastinal contours are unremarkable. There is mild osteoarthritis of the right glenohumeral joint. There is no acute osseous abnormality. IMPRESSION: Trace bilateral pleural effusions. COPD. Electronically Signed   By: Kathreen Devoid   On: 02/15/2017 09:52     1415:  On arrival: pt sitting upright, tachypneic, tachycardic, Sats 87 % R/A, lungs diminished. IV solumedrol and hour long neb started. After neb: pt appears more comfortable at rest, less tachypneic, Sats 94 % on O2 2L N/C, lungs continue diminished. Pt moves around on stretcher to stand at bedside, O2 Sats dropped 83-85% R/A, and pt c/o increasing SOB and "dizziness." HR increased to 130's. 2nd EKG obtained.  Dx and testing d/w pt and family.  Questions answered.  Verb understanding, agreeable to admit.  T/C to [facility] Dr. Dyann Kief, case discussed, including:  HPI, pertinent PM/SHx, VS/PE, dx testing, ED course and treatment:  Agreeable to admit.   1510:  HR and O2 Sats improving while resting.    Final Clinical Impressions(s) / ED Diagnoses   Final diagnoses:  None    ED Discharge Orders    None        Francine Graven, DO 02/17/17 5361

## 2017-02-15 NOTE — ED Notes (Signed)
Patient unable to ambulate.  Once patient stood from bedside, she became very dizzy and she was short of breath.  Patient unable to walk even with assistance.

## 2017-02-15 NOTE — Progress Notes (Signed)
Patient has two open wounds one on right lower back not on bony prominence and one on left upper also not on bony prominence.

## 2017-02-15 NOTE — ED Notes (Signed)
Patient was off oxygen just for a short period of time and o2 sat was 83% Sat patient up on side of bed and once again she became very dizzy.

## 2017-02-16 LAB — BASIC METABOLIC PANEL
ANION GAP: 8 (ref 5–15)
BUN: 12 mg/dL (ref 6–20)
CALCIUM: 8.6 mg/dL — AB (ref 8.9–10.3)
CO2: 25 mmol/L (ref 22–32)
Chloride: 105 mmol/L (ref 101–111)
Creatinine, Ser: 0.75 mg/dL (ref 0.44–1.00)
GLUCOSE: 151 mg/dL — AB (ref 65–99)
Potassium: 4.2 mmol/L (ref 3.5–5.1)
Sodium: 138 mmol/L (ref 135–145)

## 2017-02-16 LAB — CBC
HEMATOCRIT: 36.3 % (ref 36.0–46.0)
HEMOGLOBIN: 11.4 g/dL — AB (ref 12.0–15.0)
MCH: 30.2 pg (ref 26.0–34.0)
MCHC: 31.4 g/dL (ref 30.0–36.0)
MCV: 96.3 fL (ref 78.0–100.0)
Platelets: 258 10*3/uL (ref 150–400)
RBC: 3.77 MIL/uL — AB (ref 3.87–5.11)
RDW: 15.1 % (ref 11.5–15.5)
WBC: 3.6 10*3/uL — ABNORMAL LOW (ref 4.0–10.5)

## 2017-02-16 MED ORDER — POLYETHYLENE GLYCOL 3350 17 G PO PACK
17.0000 g | PACK | Freq: Every day | ORAL | Status: DC
  Administered 2017-02-16 – 2017-02-17 (×2): 17 g via ORAL
  Filled 2017-02-16 (×2): qty 1

## 2017-02-16 MED ORDER — DOCUSATE SODIUM 100 MG PO CAPS
100.0000 mg | ORAL_CAPSULE | Freq: Two times a day (BID) | ORAL | Status: DC
  Administered 2017-02-16 – 2017-02-17 (×2): 100 mg via ORAL
  Filled 2017-02-16 (×2): qty 1

## 2017-02-16 NOTE — Progress Notes (Signed)
[facility] PROGRESS NOTE  JAZARI OBER WGN:[id number] DOB: May 11, 1928 DOA: 02/15/2017 PCP: Celene Squibb, MD  Interim summary and HPI 84 year old female with a past medical history significant for anxiety, arthritis, COPD, dementia, depression, GERD, hyperlipidemia, seizures and SAH; who presented to the ED with increased shortness of breath, productive cough and increased wheezing.  She reports that her symptom has been present for over a week now and worsening.  She was seen at urgent care about 4-5 days prior to admission and prescribed Bactrim without improvement in her condition. Patient denies fever (but is present chills), no nausea, no vomiting, no chest pain, no hemoptysis, no abdominal pain, no hematuria, hematochezia, melena, or any other acute complaints.  Assessment/Plan: 1-Acute respiratory failure with hypoxia ([place]): In the setting of acute COPD exacerbation with bronchitis/bronchiectasis. -Patient presented with O2 sat of 85% on room air and was experiencing significant tachypnea. -now improved, using 2L [place] with good O2 sat; breathing easier, even still complaining SOB and with audible wheezing. -will continue treatment with solumedrol, Duoneb, doxycycline and pulmicort  -continue oxygen supplementation as needed and assess needs on exertion. -continue flutter valve and supportive care.  2-Seizure disorder secondary to prior SAH -continue Keppra -no recent seizure activity reported  -monitoring on telemetry   3-GERD (gastroesophageal reflux disease) -continue PPI  4-Severe protein-calorie malnutrition ([place]) -continue feeding supplements -Body mass index is 17.25 kg/m.  5-Leg wound, left -no signs of superimposed infection; no drainage and no surrounding erythema. -follow rec's from wound care service  6-HLD -continue statins   7-hx of dementia: with lost of short term memory -continue Namenda  -continue supportive care  -patient without behavioral  disturbance currently.  8-history of TIA and prior SAH -continue aspirin for secondary prevention -will avoid use of heparin products as per family/patient request -no new focal deficit appreciated.  Code Status: Full Family Communication: no family at bedside  Disposition Plan: transfer to telemetry bed, continue steroids, nebulizer and antibiotics. Assess oxygen needs on exertion. Hopefully home in 1-2 days.   Consultants:  None   Procedures:  See below for x-ray reports   Antibiotics:  Doxycycline  HPI/Subjective: Denies chest pain, no nausea, no vomiting.  Overall improved, but still short of breath with minimal exertion, with audible wheezing and using oxygen supplementation.  Objective: Vitals:   02/16/17 1257 02/16/17 1300  BP:  117/86  Pulse:    Resp:    Temp:    SpO2: 98%     Intake/Output Summary (Last 24 hours) at 02/16/2017 1548 Last data filed at 02/16/2017 1126 Gross per 24 hour  Intake 876.67 ml  Output 150 ml  Net 726.67 ml   Filed Weights   02/15/17 0904 02/15/17 1630  Weight: 42.6 kg (94 lb) 41.4 kg (91 lb 4.3 oz)    Exam:   General: Afebrile, denies chest pain, nausea and vomiting.  Still with shortness of breath, even she expressed some improvement.  Wearing 2L oxygen supplementation. Frail, underweight and chronically ill in appearance.  Cardiovascular: tachycardia, no rubs, no gallops, no JVD.  Respiratory: Positive expiratory wheezing, scattered rhonchi, no crackles.  No using accessory muscles.  Slight tachypnea with minimal exertion.  Abdomen: Soft, nontender, nondistended, positive bowel sounds.  Musculoskeletal: No edema, no cyanosis.  Patient with multiple bruises appreciated on her extremities and especially with open chronic abrasion on her left lower extremity (without superimposed erythema, no active drainage). Also with chronic wound on her LUE (w/o active signs of infection)  Data Reviewed: Basic Metabolic  Panel: Recent  Labs  Lab 02/15/17 0927 02/15/17 1751 02/16/17 0425  NA 137  --  138  K 4.2  --  4.2  CL 100*  --  105  CO2 26  --  25  GLUCOSE 90  --  151*  BUN 14  --  12  CREATININE 1.07*  --  0.75  CALCIUM 9.0  --  8.6*  MG  --  2.0  --   PHOS  --  2.6  --    CBC: Recent Labs  Lab 02/15/17 0927 02/16/17 0425  WBC 6.0 3.6*  NEUTROABS 4.6  --   HGB 11.9* 11.4*  HCT 37.4 36.3  MCV 96.1 96.3  PLT 273 258   Cardiac Enzymes: Recent Labs  Lab 02/15/17 0927  TROPONINI <0.03   BNP (last 3 results) Recent Labs    02/15/17 0927  BNP 24.0    Recent Results (from the past 240 hour(s))  MRSA PCR Screening     Status: None   Collection Time: 02/15/17  4:31 PM  Result Value Ref Range Status   MRSA by PCR NEGATIVE NEGATIVE Final    Comment:        The GeneXpert MRSA Assay (FDA approved for NASAL specimens only), is one component of a comprehensive MRSA colonization surveillance program. It is not intended to diagnose MRSA infection nor to guide or monitor treatment for MRSA infections. Performed at [facility], [address], [place], [place] [address]      Studies: Dg Chest [facility] 1 View  Result Date: 02/15/2017 CLINICAL DATA:  Cough, wheezing, shortness of breath for 1 week EXAM: PORTABLE CHEST 1 VIEW COMPARISON:  10/13/2016 FINDINGS: The lungs are hyperinflated likely secondary to COPD. There is no focal parenchymal opacity. There are trace bilateral pleural effusions. There is no pneumothorax. The heart and mediastinal contours are unremarkable. There is mild osteoarthritis of the right glenohumeral joint. There is no acute osseous abnormality. IMPRESSION: Trace bilateral pleural effusions. COPD. Electronically Signed   By: Kathreen Devoid   On: 02/15/2017 09:52    Scheduled Meds: . aspirin  325 mg Oral Daily  . atorvastatin  40 mg Oral q1800  . budesonide (PULMICORT) nebulizer solution  0.5 mg Nebulization BID  . citalopram  10 mg Oral Daily  . darifenacin  7.5 mg Oral  Daily  . docusate sodium  100 mg Oral BID  . doxycycline  100 mg Oral Q12H  . feeding supplement (ENSURE ENLIVE)  237 mL Oral BID BM  . ipratropium-albuterol  3 mL Nebulization QID  . levETIRAcetam  250 mg Oral BID  . memantine  28 mg Oral Daily  . methylPREDNISolone (SOLU-MEDROL) injection  60 mg Intravenous Q8H  . pantoprazole  40 mg Oral Daily  . polyethylene glycol  17 g Oral Daily  . saccharomyces boulardii  250 mg Oral BID  . tamsulosin  0.4 mg Oral Daily   Continuous Infusions: . sodium chloride 100 mL/hr at 02/16/17 0547    Time spent: 30 minutes    [place] [facility] Pager [phone] 7PM-7AM, please contact night-coverage at [email or web address], password [facility] 02/16/2017, 3:48 PM  LOS: 1 day

## 2017-02-16 NOTE — Care Management Note (Addendum)
Case Management Note  Patient Details  Name: [name] MRN: [id number] Date of Birth: 02/17/1928  Subjective/Objective:   Adm with Respiratory failure. From home with daughter, has RW and Radio producer. Acutely on oxygen. Goes to [facility] OP center for wounds.   Action/Plan: CM following for needs.   ADDENDUM 02/17/2017: Discharging home today. RN to wean to room air prior to DC.   Expected Discharge Date:   02/18/2017               Expected Discharge Plan:     In-House Referral:     Discharge planning Services  CM Consult  Post Acute Care Choice:    Choice offered to:     DME Arranged:    DME Agency:     HH Arranged:    HH Agency:     Status of Service:  In process, will continue to follow  If discussed at Long Length of Stay Meetings, dates discussed:    Additional Comments:  Jacobs Golab, Chauncey Reading, RN 02/16/2017, 3:10 PM

## 2017-02-16 NOTE — Consult Note (Signed)
[facility] Nurse wound consult note  Assessment completed via camera, remotely with assistance of bedside RN.  Reason for Consult: Left elbow and LLE wounds  Wound type: Left elbow--abrasion from sheets per patient LLE:  Venous stasis wound being followed by the [facility] wound [facility].  Measurement: Left elbow:  1.0 cm x 1.2 cm x 0.1 cm; pink wound bed, no odor, or drainage.  Surrounding skin intact, normal in color.  LLE:  5 cm x 2.5 cm x 0.1 cm;  Pink wound bed, no odor, moderate drainage on dressing. Surrounding skin intact with scattered bruising.  Dressing procedure/placement/frequency:  Left elbow:  Cleanse with saline.  Apply foam dressing.  Change every 3 days and prn.  LLE:  Cleanse with saline.  Apply Xeroform gauze, cover with Kerlex then Ace wrap.  Change every Tuesday - Thursday - Saturday.  Discussed POC with patient and bedside nurse.  Re consult if needed, will not follow at this time. Thanks Val Riles MSN, RN, CNS-BC, [employer]

## 2017-02-16 NOTE — Progress Notes (Signed)
Initial Nutrition Assessment  DOCUMENTATION CODES:  Severe malnutrition in context of chronic illness, Underweight  INTERVENTION:  Ensure Enlive po BID, each supplement provides 350 kcal and 20 grams of protein  Magic cup BID with meals, each supplement provides 290 kcal and 9 grams of protein   Bowel of gravy with meals to add extra moisture and kcals  NUTRITION DIAGNOSIS:  Severe Malnutrition(Chronic; stable) related to catabolic illness(COPD) as evidenced by severe muscle/fat wasting  GOAL:  Patient will meet greater than or equal to 90% of their needs  MONITOR:  PO intake, Supplement acceptance, Labs, Weight trends, I & O's  REASON FOR ASSESSMENT:  Consult Assessment of nutrition requirement/status, COPD Protocol  ASSESSMENT:  84 y/o female PMHx Anxiety/Depression, Dementia, COPD, GERD, HLD, Seizures r/t prior SAH. Presented w/ increased SOB/wheezing x1 week refractory to OP abx. Also w/ decreased appetite. Worked up for hypoxia and admitted for acute respiratory failure related to COPD exacerbation/bronchitis  Pt has dx of dementia, however, this seems to be quite mild as she is well oriented and presents as a good historian.   She reports that, at baseline, she eats 3 full meals and consumes 3 Ensure supplements per day. She has not had any trouble with her appetite, however, she notes that when she has trouble breathing, her appetite understandably is reduced. She says she is effectively able to gain weight when she is breathing well. She is not on O2 at home.   She says that, 6 months ago, she weighed 105 lbs. Pt presented to ED at just over 91 lbs and, Per chart, the patient was 94 lbs approximately 1 year ago. Recent UBW appears to be 90-95 lbs. Did have some acute wt gain this past Nov-Jan,with wts being as high as 99, but these are outliers.   At this time, patient says she has a good appetite, but is having difficulty chewing the meats. She has upper dentures and she  has trouble eating some drier items. She thought adding gravy would help add some moisture. This will also provide extra needed kcals  Physical Exam: Cachectic, severe muscle/fat wasting of nearly entire body  Labs: BGs:90-151 Meds: Abx, Ensure, Methylprednisolone, ppi, Probiotic, IVF, Namenda  Recent Labs  Lab 02/15/17 0927 02/15/17 1751 02/16/17 0425  NA 137  --  138  K 4.2  --  4.2  CL 100*  --  105  CO2 26  --  25  BUN 14  --  12  CREATININE 1.07*  --  0.75  CALCIUM 9.0  --  8.6*  MG  --  2.0  --   PHOS  --  2.6  --   GLUCOSE 90  --  151*   NUTRITION - FOCUSED PHYSICAL EXAM:   Most Recent Value  Orbital Region  Mild depletion  Upper Arm Region  Severe depletion  Thoracic and Lumbar Region  Severe depletion  Buccal Region  Mild depletion  Temple Region  Moderate depletion  Clavicle Bone Region  Severe depletion  Clavicle and Acromion Bone Region  Severe depletion  Scapular Bone Region  Unable to assess  Dorsal Hand  Severe depletion  Patellar Region  Severe depletion  Anterior Thigh Region  Severe depletion  Posterior Calf Region  Severe depletion  Edema (RD Assessment)  None      Diet Order:  Diet regular Room service appropriate? Yes; Fluid consistency: Thin  EDUCATION NEEDS:  No education needs have been identified at this time  Skin: Laceration to L arm,  Venous ulcer to LLE  Last BM:  2/9  Height:  Ht Readings from Last 1 Encounters:  02/15/17 5\' 1"  (1.549 m)   Weight:  Wt Readings from Last 1 Encounters:  02/15/17 91 lb 4.3 oz (41.4 kg)   Wt Readings from Last 10 Encounters:  02/15/17 91 lb 4.3 oz (41.4 kg)  01/11/17 98 lb (44.5 kg)  12/07/16 99 lb (44.9 kg)  11/02/16 98 lb (44.5 kg)  10/20/16 101 lb (45.8 kg)  10/12/16 85 lb 14.4 oz (39 kg)  10/06/16 100 lb 7 oz (45.6 kg)  07/07/16 93 lb (42.2 kg)  07/06/16 93 lb 9.6 oz (42.5 kg)  04/27/16 93 lb (42.2 kg)   Ideal Body Weight:  47.73 kg  BMI:  Body mass index is 17.25 kg/m.  Estimated  Nutritional Needs:  Kcal:  1400-1600 kcals (34-39 kcal/kg bw) Protein:  60-70g Pro (1.2-1.4 g/kg bw) Fluid:  >1.4 L (1 ml/kcal)  Burtis Junes RD, LDN, CNSC Clinical Nutrition Pager: [phone] 02/16/2017 12:39 PM

## 2017-02-17 DIAGNOSIS — R569 Unspecified convulsions: Secondary | ICD-10-CM

## 2017-02-17 DIAGNOSIS — E43 Unspecified severe protein-calorie malnutrition: Secondary | ICD-10-CM

## 2017-02-17 DIAGNOSIS — S81802D Unspecified open wound, left lower leg, subsequent encounter: Secondary | ICD-10-CM

## 2017-02-17 DIAGNOSIS — J441 Chronic obstructive pulmonary disease with (acute) exacerbation: Principal | ICD-10-CM

## 2017-02-17 DIAGNOSIS — J9601 Acute respiratory failure with hypoxia: Secondary | ICD-10-CM

## 2017-02-17 DIAGNOSIS — K219 Gastro-esophageal reflux disease without esophagitis: Secondary | ICD-10-CM

## 2017-02-17 LAB — CBC
HCT: 34.2 % — ABNORMAL LOW (ref 36.0–46.0)
Hemoglobin: 10.8 g/dL — ABNORMAL LOW (ref 12.0–15.0)
MCH: 30.7 pg (ref 26.0–34.0)
MCHC: 31.6 g/dL (ref 30.0–36.0)
MCV: 97.2 fL (ref 78.0–100.0)
PLATELETS: 275 10*3/uL (ref 150–400)
RBC: 3.52 MIL/uL — AB (ref 3.87–5.11)
RDW: 15.2 % (ref 11.5–15.5)
WBC: 7.7 10*3/uL (ref 4.0–10.5)

## 2017-02-17 LAB — BASIC METABOLIC PANEL
ANION GAP: 8 (ref 5–15)
BUN: 13 mg/dL (ref 6–20)
CO2: 24 mmol/L (ref 22–32)
Calcium: 8.6 mg/dL — ABNORMAL LOW (ref 8.9–10.3)
Chloride: 107 mmol/L (ref 101–111)
Creatinine, Ser: 0.63 mg/dL (ref 0.44–1.00)
GFR calc Af Amer: >60 mL/min (ref >60)
Glucose, Bld: 152 mg/dL — ABNORMAL HIGH (ref 65–99)
POTASSIUM: 4.3 mmol/L (ref 3.5–5.1)
SODIUM: 139 mmol/L (ref 135–145)

## 2017-02-17 MED ORDER — PANTOPRAZOLE SODIUM 40 MG PO TBEC: 40.0000 mg | DELAYED_RELEASE_TABLET | Freq: Every day | ORAL | Status: DC

## 2017-02-17 MED ORDER — MEMANTINE HCL ER 7 MG PO CP24
14.0000 mg | ORAL_CAPSULE | Freq: Every day | ORAL | Status: DC
  Filled 2017-02-17 (×3): qty 1

## 2017-02-17 MED ORDER — MEMANTINE HCL ER 14 MG PO CP24: 14.0000 mg | ORAL_CAPSULE | Freq: Every day | ORAL | 0 refills | Status: DC

## 2017-02-17 MED ORDER — IPRATROPIUM-ALBUTEROL 0.5-2.5 (3) MG/3ML IN SOLN
3.0000 mL | Freq: Three times a day (TID) | RESPIRATORY_TRACT | Status: DC
  Administered 2017-02-17: 3 mL via RESPIRATORY_TRACT
  Filled 2017-02-17: qty 3

## 2017-02-17 MED ORDER — PREDNISONE 20 MG PO TABS: ORAL_TABLET | ORAL | 0 refills | Status: DC

## 2017-02-17 MED ORDER — DOXYCYCLINE HYCLATE 100 MG PO TABS: 100.0000 mg | ORAL_TABLET | Freq: Two times a day (BID) | ORAL | 0 refills | Status: AC

## 2017-02-17 NOTE — Care Management Important Message (Signed)
Important Message  Patient Details  Name: KESHAWN SUNDBERG MRN: [id number] Date of Birth: 1928/12/14   Medicare Important Message Given:  Yes    Caster Fayette, Chauncey Reading, RN 02/17/2017, 10:23 AM

## 2017-02-17 NOTE — Progress Notes (Signed)
SATURATION QUALIFICATIONS: (This note is used to comply with regulatory documentation for home oxygen)  Patient Saturations on Room Air at Rest = 92%  Patient Saturations on Room Air while Ambulating =85%  Patient Saturations on 2 Liters of oxygen while Ambulating = 94%  Please briefly explain why patient needs home oxygen: 

## 2017-02-17 NOTE — Discharge Summary (Addendum)
Physician Discharge Summary  Bridget Ball ZLD:[id number] DOB: 01-15-1928 DOA: 02/15/2017  PCP: Celene Squibb, MD  Admit date: 02/15/2017 Discharge date: 02/17/2017  Admitted From: Home  Disposition: Home   Recommendations for Outpatient Follow-up:  1. Follow up with PCP in 1 weeks 2. Please re-assess need for continued home oxygen.  3. Please obtain BMP/CBC in one week 4. Please follow up on the following pending results:final culture data  Discharge Condition: STABLE   CODE STATUS: FULL    Brief Hospitalization Summary: Please see all hospital notes, images, labs for full details of the hospitalization.  HPI: Bridget Ball is a 84 year old female with a past medical history significant for anxiety, arthritis, COPD, dementia, depression, GERD, hyperlipidemia, seizures and SAH; who presented to the ED with increased shortness of breath, productive cough and increased wheezing.  She reports that her symptom has been present for over a week now and worsening.  She was seen at urgent care about 4-5 days prior to admission and prescribed Bactrim without improvement in her condition. Patient denies fever (but is present chills), no nausea, no vomiting, no chest pain, no hemoptysis, no abdominal pain, no hematuria, hematochezia, melena, or any other acute complaints.  Interim summary and HPI 84 year old female with a past medical history significant for anxiety, arthritis, COPD, dementia, depression, GERD, hyperlipidemia, seizuresand SAH;who presented to the ED with increased shortness of breath, productive cough and increased wheezing. She reports that her symptom has been present for over a week now and worsening. She was seen at urgent care about 4-5 days prior to admission and prescribed Bactrim without improvement in her condition. Patient denies fever (but is present chills), no nausea, no vomiting, no chest pain, no hemoptysis, no abdominal pain, no hematuria, hematochezia, melena, or  any other acute complaints.  Assessment/Plan: 1-Acute respiratory failure with hypoxia Resolved:In the setting of acute COPD exacerbation with bronchitis/bronchiectasis. -Patient presented with O2 sat of 85% on room air and was experiencing significant tachypnea.   -now improved, using 2L [place] with good O2 sat; breathing easier, even still complaining SOB and with audible wheezing. -treated  with solumedrol, Duoneb, doxycycline and pulmicort  -wean oxygen to room air -discharge home today with prednisone taper.  Pt will discharge with home oxygen 2L/min.   2-Seizure disorder secondary to prior SAH -continue Keppra -no recent seizure activity reported  -monitored on telemetry   3-GERD (gastroesophageal reflux disease) -continue PPI  4-Severe protein-calorie malnutrition  -continue feeding supplements -Body mass index is 17.25 kg/m.  5-chronic wounds -no signs of superimposed infection; no drainage and no surrounding erythema. -resume outpatient wound care service  6-HLD -continue statins   7-hx of dementia: with lost of short term memory -continue Namenda  -continue supportive care -patient without behavioral disturbance currently.  8-history of [facility]and prior [facility] -continue aspirin for secondary prevention -will avoid use of heparin products as per family/patient request -no new focal deficit appreciated.  Code Status: Full Family Communication: bedside  Disposition Plan: Home.   Procedures:  See below for x-ray reports   Antibiotics:  Doxycycline  Discharge Diagnoses:  Principal Problem:   Acute respiratory failure with hypoxia (HCC) Active Problems:   Seizure disorder secondary to prior SAH   COPD with acute exacerbation (HCC)   GERD (gastroesophageal reflux disease)   Severe protein-calorie malnutrition (HCC)   Leg wound, left  Discharge Instructions: Discharge Instructions    Call MD for:  difficulty breathing, headache or visual  disturbances   Complete by:  As directed  Call MD for:  extreme fatigue   Complete by:  As directed    Call MD for:  persistant dizziness or light-headedness   Complete by:  As directed    Diet - low sodium heart healthy   Complete by:  As directed    Increase activity slowly   Complete by:  As directed      Allergies as of 02/17/2017      Reactions   Penicillins Anaphylaxis   Throat swelling as a child. Tolerated ceftriaxone 07/29/13.       Medication List    TAKE these medications   albuterol 108 (90 Base) MCG/ACT inhaler Commonly known as:  PROVENTIL HFA;VENTOLIN HFA Inhale 2 puffs into the lungs every 6 (six) hours as needed for wheezing or shortness of breath.   albuterol (2.5 MG/3ML) 0.083% nebulizer solution Commonly known as:  PROVENTIL Take 2.5 mg by nebulization 2 (two) times daily.   aspirin 325 MG tablet Take 325 mg by mouth daily.   atorvastatin 80 MG tablet Commonly known as:  LIPITOR Take 40 mg by mouth daily at 6 PM.   budesonide 3 MG 24 hr capsule Commonly known as:  ENTOCORT EC Take 6 mg by mouth daily as needed (for severe diarrhea).   budesonide-formoterol 160-4.5 MCG/ACT inhaler Commonly known as:  SYMBICORT Inhale 2 puffs into the lungs 2 (two) times daily.   citalopram 10 MG tablet Commonly known as:  CELEXA Take 1 tablet (10 mg total) by mouth daily.   docusate sodium 100 MG capsule Commonly known as:  COLACE Take 200 mg by mouth daily as needed for mild constipation.   doxycycline 100 MG tablet Commonly known as:  VIBRA-TABS Take 1 tablet (100 mg total) by mouth every 12 (twelve) hours for 5 days.   feeding supplement (ENSURE ENLIVE) Liqd Take 237 mLs by mouth 2 (two) times daily between meals.   Ferrous Sulfate 27 MG Tabs Take 27 mg by mouth daily.   levETIRAcetam 250 MG tablet Commonly known as:  KEPPRA Take 1 tablet (250 mg total) by mouth 2 (two) times daily.   memantine 14 MG Cp24 24 hr capsule Commonly known as:   NAMENDA XR Take 1 capsule (14 mg total) by mouth daily. What changed:    medication strength  how much to take   pantoprazole 40 MG tablet Commonly known as:  PROTONIX Take 1 tablet (40 mg total) by mouth daily.   Potassium 99 MG Tabs Take 99 mg by mouth daily.   predniSONE 20 MG tablet Commonly known as:  DELTASONE Take 3 PO QAM x3days, 2 PO QAM x3days, 1 PO QAM x3days Start taking on:  02/18/2017   SPIRIVA HANDIHALER 18 MCG inhalation capsule Generic drug:  tiotropium Place 1 capsule into inhaler and inhale daily.   tamsulosin 0.4 MG Caps capsule Commonly known as:  FLOMAX Take 1 capsule (0.4 mg total) by mouth daily.   VESICARE 10 MG tablet Generic drug:  solifenacin Take 5 mg by mouth daily.            Durable Medical Equipment  (From admission, onward)        Start     Ordered   02/17/17 1213  For home use only DME oxygen  Once    Question Answer Comment  Mode or (Route) Nasal cannula   Liters per Minute 2   Frequency Continuous (stationary and portable oxygen unit needed)   Oxygen conserving device Yes   Oxygen delivery system Gas  02/17/17 1212     Follow-up Information    Pablo Lawrence, NP On 02/23/2017.   Specialty:  Adult Health Nurse Practitioner Why:  at 11:20 am Contact information: [place] [id number] [phone]          Allergies  Allergen Reactions  . Penicillins Anaphylaxis    Throat swelling as a child. Tolerated ceftriaxone 07/29/13.    Allergies as of 02/17/2017      Reactions   Penicillins Anaphylaxis   Throat swelling as a child. Tolerated ceftriaxone 07/29/13.       Medication List    TAKE these medications   albuterol 108 (90 Base) MCG/ACT inhaler Commonly known as:  PROVENTIL HFA;VENTOLIN HFA Inhale 2 puffs into the lungs every 6 (six) hours as needed for wheezing or shortness of breath.   albuterol (2.5 MG/3ML) 0.083% nebulizer solution Commonly known as:  PROVENTIL Take 2.5 mg by  nebulization 2 (two) times daily.   aspirin 325 MG tablet Take 325 mg by mouth daily.   atorvastatin 80 MG tablet Commonly known as:  LIPITOR Take 40 mg by mouth daily at 6 PM.   budesonide 3 MG 24 hr capsule Commonly known as:  ENTOCORT EC Take 6 mg by mouth daily as needed (for severe diarrhea).   budesonide-formoterol 160-4.5 MCG/ACT inhaler Commonly known as:  SYMBICORT Inhale 2 puffs into the lungs 2 (two) times daily.   citalopram 10 MG tablet Commonly known as:  CELEXA Take 1 tablet (10 mg total) by mouth daily.   docusate sodium 100 MG capsule Commonly known as:  COLACE Take 200 mg by mouth daily as needed for mild constipation.   doxycycline 100 MG tablet Commonly known as:  VIBRA-TABS Take 1 tablet (100 mg total) by mouth every 12 (twelve) hours for 5 days.   feeding supplement (ENSURE ENLIVE) Liqd Take 237 mLs by mouth 2 (two) times daily between meals.   Ferrous Sulfate 27 MG Tabs Take 27 mg by mouth daily.   levETIRAcetam 250 MG tablet Commonly known as:  KEPPRA Take 1 tablet (250 mg total) by mouth 2 (two) times daily.   memantine 14 MG Cp24 24 hr capsule Commonly known as:  NAMENDA XR Take 1 capsule (14 mg total) by mouth daily. What changed:    medication strength  how much to take   pantoprazole 40 MG tablet Commonly known as:  PROTONIX Take 1 tablet (40 mg total) by mouth daily.   Potassium 99 MG Tabs Take 99 mg by mouth daily.   predniSONE 20 MG tablet Commonly known as:  DELTASONE Take 3 PO QAM x3days, 2 PO QAM x3days, 1 PO QAM x3days Start taking on:  02/18/2017   SPIRIVA HANDIHALER 18 MCG inhalation capsule Generic drug:  tiotropium Place 1 capsule into inhaler and inhale daily.   tamsulosin 0.4 MG Caps capsule Commonly known as:  FLOMAX Take 1 capsule (0.4 mg total) by mouth daily.   VESICARE 10 MG tablet Generic drug:  solifenacin Take 5 mg by mouth daily.            Durable Medical Equipment  (From admission,  onward)        Start     Ordered   02/17/17 1213  For home use only DME oxygen  Once    Question Answer Comment  Mode or (Route) Nasal cannula   Liters per Minute 2   Frequency Continuous (stationary and portable oxygen unit needed)   Oxygen conserving device Yes  Oxygen delivery system Gas      02/17/17 1212      Procedures/Studies: Dg Chest Port 1 View  Result Date: 02/15/2017 CLINICAL DATA:  Cough, wheezing, shortness of breath for 1 week EXAM: PORTABLE CHEST 1 VIEW COMPARISON:  10/13/2016 FINDINGS: The lungs are hyperinflated likely secondary to COPD. There is no focal parenchymal opacity. There are trace bilateral pleural effusions. There is no pneumothorax. The heart and mediastinal contours are unremarkable. There is mild osteoarthritis of the right glenohumeral joint. There is no acute osseous abnormality. IMPRESSION: Trace bilateral pleural effusions. COPD. Electronically Signed   By: Kathreen Devoid   On: 02/15/2017 09:52     Subjective: The patient reports that she feels much better.  She is breathing better.  She is not coughing and wheezing like she was when she was in  Discharge Exam: Vitals:   02/17/17 0647 02/17/17 0808  BP: 118/68   Pulse: (!) 101   Resp: 20   Temp: 98.2 F (36.8 C)   SpO2: 95% 94%   Vitals:   02/16/17 2216 02/17/17 0500 02/17/17 0647 02/17/17 0808  BP: 110/77  118/68   Pulse: (!) 101  (!) 101   Resp: 20  20   Temp: 98 F (36.7 C)  98.2 F (36.8 C)   TempSrc: Oral  Oral   SpO2: 94%  95% 94%  Weight:  43.8 kg (96 lb 9 oz)    Height:       :   General: Afebrile, denies chest pain, nausea and vomiting.  Wearing 2L oxygen supplementation. Frail, underweight and chronically ill in appearance.  Cardiovascular: tachycardia, no rubs, no gallops, no JVD.  Respiratory: BBS clear, no wheezing or rhonchi.  No using accessory muscles.  Slight tachypnea with minimal exertion.  Abdomen: Soft, nontender, nondistended, positive bowel  sounds.  Musculoskeletal: No edema, no cyanosis.  Patient with multiple bruises appreciated on her extremities and especially with open chronic abrasion on her left lower extremity (without superimposed erythema, no active drainage). Also with chronic wound on her LUE (w/o active signs of infection)   The results of significant diagnostics from this hospitalization (including imaging, microbiology, ancillary and laboratory) are listed below for reference.     Microbiology: Recent Results (from the past 240 hour(s))  MRSA PCR Screening     Status: None   Collection Time: 02/15/17  4:31 PM  Result Value Ref Range Status   MRSA by PCR NEGATIVE NEGATIVE Final    Comment:        The GeneXpert MRSA Assay (FDA approved for NASAL specimens only), is one component of a comprehensive MRSA colonization surveillance program. It is not intended to diagnose MRSA infection nor to guide or monitor treatment for MRSA infections. Performed at [facility], [address], [place], [place] [address]      Labs: BNP (last 3 results) Recent Labs    02/15/17 0927  BNP 60.4   Basic Metabolic Panel: Recent Labs  Lab 02/15/17 0927 02/15/17 1751 02/16/17 0425 02/17/17 0354  NA 137  --  138 139  K 4.2  --  4.2 4.3  CL 100*  --  105 107  CO2 26  --  25 24  GLUCOSE 90  --  151* 152*  BUN 14  --  12 13  CREATININE 1.07*  --  0.75 0.63  CALCIUM 9.0  --  8.6* 8.6*  MG  --  2.0  --   --   PHOS  --  2.6  --   --  Liver Function Tests: No results for input(s): AST, ALT, ALKPHOS, BILITOT, PROT, ALBUMIN in the last 168 hours. No results for input(s): LIPASE, AMYLASE in the last 168 hours. No results for input(s): AMMONIA in the last 168 hours. CBC: Recent Labs  Lab 02/15/17 0927 02/16/17 0425 02/17/17 0354  WBC 6.0 3.6* 7.7  NEUTROABS 4.6  --   --   HGB 11.9* 11.4* 10.8*  HCT 37.4 36.3 34.2*  MCV 96.1 96.3 97.2  PLT 273 258 275   Cardiac Enzymes: Recent Labs  Lab 02/15/17 0927   TROPONINI <0.03   BNP: Invalid input(s): POCBNP CBG: No results for input(s): GLUCAP in the last 168 hours. D-Dimer No results for input(s): DDIMER in the last 72 hours. Hgb A1c No results for input(s): HGBA1C in the last 72 hours. Lipid Profile No results for input(s): CHOL, HDL, LDLCALC, TRIG, CHOLHDL, LDLDIRECT in the last 72 hours. Thyroid function studies No results for input(s): TSH, T4TOTAL, T3FREE, THYROIDAB in the last 72 hours.  Invalid input(s): FREET3 Anemia work up No results for input(s): VITAMINB12, FOLATE, FERRITIN, TIBC, IRON, RETICCTPCT in the last 72 hours. Urinalysis    Component Value Date/Time   COLORURINE YELLOW 10/13/2016 0309   APPEARANCEUR HAZY (A) 10/13/2016 0309   LABSPEC 1.019 10/13/2016 0309   PHURINE 6.0 10/13/2016 0309   GLUCOSEU NEGATIVE 10/13/2016 0309   HGBUR SMALL (A) 10/13/2016 0309   BILIRUBINUR NEGATIVE 10/13/2016 0309   KETONESUR 5 (A) 10/13/2016 0309   PROTEINUR 30 (A) 10/13/2016 0309   UROBILINOGEN 0.2 07/26/2013 2234   NITRITE NEGATIVE 10/13/2016 0309   LEUKOCYTESUR LARGE (A) 10/13/2016 0309   Sepsis Labs Invalid input(s): PROCALCITONIN,  WBC,  LACTICIDVEN Microbiology Recent Results (from the past 240 hour(s))  MRSA PCR Screening     Status: None   Collection Time: 02/15/17  4:31 PM  Result Value Ref Range Status   MRSA by PCR NEGATIVE NEGATIVE Final    Comment:        The GeneXpert MRSA Assay (FDA approved for NASAL specimens only), is one component of a comprehensive MRSA colonization surveillance program. It is not intended to diagnose MRSA infection nor to guide or monitor treatment for MRSA infections. Performed at [facility], [address] [place], [place] [address]    Time coordinating discharge: 34  mins  SIGNED:  Irwin Brakeman, MD  [facility] 02/17/2017, 12:15 PM Pager [phone]  If 7PM-7AM, please contact night-coverage [email or web address] Password [email or web address]

## 2017-02-17 NOTE — Discharge Instructions (Signed)
Follow with Primary MD  Celene Squibb, MD  and other consultant's as instructed your Hospitalist MD  Please get a complete blood count and chemistry panel checked by your Primary MD at your next visit, and again as instructed by your Primary MD.  Get Medicines reviewed and adjusted: Please take all your medications with you for your next visit with your Primary MD  Laboratory/radiological data: Please request your Primary MD to go over all hospital tests and procedure/radiological results at the follow up, please ask your Primary MD to get all Hospital records sent to his/her office.  In some cases, they will be blood work, cultures and biopsy results pending at the time of your discharge. Please request that your primary care M.D. follows up on these results.  Also Note the following: If you experience worsening of your admission symptoms, develop shortness of breath, life threatening emergency, suicidal or homicidal thoughts you must seek medical attention immediately by calling 911 or calling your MD immediately  if symptoms less severe.  You must read complete instructions/literature along with all the possible adverse reactions/side effects for all the Medicines you take and that have been prescribed to you. Take any new Medicines after you have completely understood and accpet all the possible adverse reactions/side effects.   Do not drive when taking Pain medications or sleeping medications (Benzodaizepines)  Do not take more than prescribed Pain, Sleep and Anxiety Medications. It is not advisable to combine anxiety,sleep and pain medications without talking with your primary care practitioner  Special Instructions: If you have smoked or chewed Tobacco  in the last 2 yrs please stop smoking, stop any regular Alcohol  and or any Recreational drug use.  Wear Seat belts while driving.  Please note: You were cared for by a hospitalist during your hospital stay. Once you are discharged,  your primary care physician will handle any further medical issues. Please note that NO REFILLS for any discharge medications will be authorized once you are discharged, as it is imperative that you return to your primary care physician (or establish a relationship with a primary care physician if you do not have one) for your post hospital discharge needs so that they can reassess your need for medications and monitor your lab values.    Acute Respiratory Distress Syndrome, Adult Acute respiratory distress syndrome is a life-threatening condition in which fluid collects in the lungs. This prevents the lungs from filling with air and passing oxygen into the blood. This can cause the lungs and other vital organs to fail. The condition usually develops following an infection, illness, surgery, or injury. What are the causes? This condition may be caused by:  An infection, such as sepsis or pneumonia.  A serious injury to the head or chest.  Severe bleeding from an injury.  A major surgery.  Breathing in harmful chemicals or smoke.  Blood transfusions.  A blood clot in the lungs.  Breathing in vomit (aspiration).  Near-drowning.  Inflammation of the pancreas (pancreatitis).  A drug overdose.  What are the signs or symptoms? Sudden shortness of breath and rapid breathing are the main symptoms of this condition. Other symptoms may include:  A fast or irregular heartbeat.  Skin, lips, or fingernails that look blue (cyanosis).  Confusion.  Tiredness or loss of energy.  Chest pain, particularly while taking a breath.  Coughing.  Restlessness or anxiety.  Fever. This is usually present if there is an underlying infection, such as pneumonia.  How is  this diagnosed? This condition is diagnosed based on:  Your symptoms.  Medical history.  A physical exam. During the exam, your health care provider will listen to your heart and check for crackling or wheezing sounds in  your lungs.  You may also have other tests to confirm the diagnosis and measure how well your lungs are working. These may include:  Measuring the amount of oxygen in your blood. Your health care provider will use two methods to do this procedure: ? A small device (pulse oximeter) that is placed on your finger, earlobe, or toe. ? An arterial blood gas test. A sample of blood is taken from an artery and tested for oxygen levels.  Blood tests.  Chest X-rays or CT scans to look for fluid in the lungs.  Taking a sample of your sputum to test for infection.  Heart test, such as an echocardiogram or electrocardiogram. This is done to rule out any heart problems (such as heart failure) that may be causing your symptoms.  Bronchoscopy. During this test, a thin, flexible tube with a light is passed into the mouth or nose, down the windpipe, and into the lungs.  How is this treated? Treatment depends on the cause of your condition. The goal is to support you while your lungs heal and the underlying cause is treated. Treatment may include:  Oxygen therapy. This may be done through: ? A tube in your nose or a face mask. ? A ventilator. This device helps move air into and out of your lungs through a breathing tube that is inserted into your mouth or nose.  Continuous positive airway pressure (CPAP). This treatment uses mild air pressure to keep the airways open. A mask or other device will be placed over your nose or mouth.  Tracheostomy. During this procedure, a small cut is made in your neck to create an opening to your windpipe. A breathing tube is placed directly into your windpipe. The breathing tube is connected to a ventilator. This is done if you have problems with your airway or if you need a ventilator for a long period of time.  Positioning you to lie on your stomach (prone position).  Medicines, such as: ? Sedatives to help you relax. ? Blood pressure medicines. ? Antibiotics to  treat infection. ? Blood thinners to prevent blood clots. ? Diuretics to help prevent excess fluid.  Fluids and nutrients given through an IV tube.  Wearing compression stockings on your legs to prevent blood clots.  Extra corporeal membrane oxygenation (ECMO). This treatment takes blood outside your body, adds oxygen, and removes carbon dioxide. The blood is then returned to your body. This treatment is only used in severe cases.  Follow these instructions at home:  Take over-the-counter and prescription medicines only as told by your health care provider.  Do not use any products that contain nicotine or tobacco, such as cigarettes and e-cigarettes. If you need help quitting, ask your health care provider.  Limit alcohol intake to no more than 1 drink per day for nonpregnant women and 2 drinks per day for men. One drink equals 12 oz of beer, 5 oz of wine, or 1 oz of hard liquor.  Ask friends and family to help you if daily activities make you tired.  Attend any pulmonary rehabilitation as told by your health care provider. This may include: ? Education about your condition. ? Exercises. ? Breathing training. ? Counseling. ? Learning techniques to conserve energy. ? Nutrition counseling.  Keep all follow-up visits as told by your health care provider. This is important. Contact a health care provider if:  You become short of breath during activity or while resting.  You develop a cough that does not go away.  You have a fever.  Your symptoms do not get better or they get worse.  You become anxious or depressed. Get help right away if:  You have sudden shortness of breath.  You develop sudden chest pain that does not go away.  You develop a rapid heart rate.  You develop swelling or pain in one of your legs.  You cough up blood.  You have trouble breathing.  Your skin, lips, or fingernails turn blue. These symptoms may represent a serious problem that is an  emergency. Do not wait to see if the symptoms will go away. Get medical help right away. Call your local emergency services (911 in the [place] Do not drive yourself to the hospital. Summary  Acute respiratory distress syndrome is a life-threatening condition in which fluid collects in the lungs, which leads the lungs and other vital organs to fail.  This condition usually develops following an infection, illness, surgery, or injury.  Sudden shortness of breath and rapid breathing are the main symptoms of acute respiratory distress syndrome.  Treatment may include oxygen therapy, continuous positive airway pressure (CPAP), tracheostomy, lying on your stomach (prone position), medicines, fluids and nutrients given through an IV tube, compression stockings, and extra corporeal membrane oxygenation (ECMO). This information is not intended to replace advice given to you by your health care provider. Make sure you discuss any questions you have with your health care provider. Document Released: 12/22/2004 Document Revised: 12/09/2015 Document Reviewed: 12/09/2015 Elsevier Interactive Patient Education  2017 [employer].

## 2017-02-17 NOTE — Progress Notes (Signed)
IV discontinued,catheter intact. Discharge instructions given on medications,and follow up visits, patient verbalized understanding. Prescriptions sent to Pharmacy of choice documented on AVS. Patient discharged with oxygen at 2 liters vis nasal canula.Accompanied by staff to an awaiting vehicle.

## 2017-02-17 NOTE — Progress Notes (Signed)
Late entry from 0125-Pts bed alarm went off in room. Pt trying to get OOB without calling for assistance. Pt stated she needed to pee. Blood all over pt and floor d/t pts IV coming out. Pt confused and states she is across the street from the hospital and asks this RN if i'm going to work. RN reoriented pt to person, place, time, get pt to [facility] x1 assist. Changed pts sheets, gown and cleaned pt up. Pt re-educated to call when she needs to get up to pee. Call bell laid beside pt, pt verbalized understanding Will continue to monitor.

## 2017-02-17 NOTE — Care Management Note (Signed)
Case Management Note  Patient Details  Name: Bridget Ball MRN: [id number] Date of Birth: [birth year]   Expected Discharge Date:  02/17/17               Expected Discharge Plan:  Home/Self Care  In-House Referral:     Discharge planning Services  CM Consult  Post Acute Care Choice:  NA Choice offered to:  Patient  DME Arranged:  Oxygen DME Agency:  [place]:    [facility] Agency:     Status of Service:  Completed, signed off  If discussed at [place] of Stay Meetings, dates discussed:    Additional Comments: Patient qualifies for oxygen.  History of COPD, sats down to 85% on room air. [facility] would like [facility]. Juliann Pulse of [facility] notified, will deliver tank to room for transport home.   Maraya Gwilliam, Chauncey Reading, RN 02/17/2017, 1:12 PM

## 2017-02-18 ENCOUNTER — Ambulatory Visit (HOSPITAL_COMMUNITY): Payer: Medicare Other | Admitting: Physical Therapy

## 2017-02-18 ENCOUNTER — Telehealth (HOSPITAL_COMMUNITY): Payer: Self-pay

## 2017-02-18 ENCOUNTER — Ambulatory Visit (HOSPITAL_COMMUNITY): Payer: Medicare Other

## 2017-02-18 DIAGNOSIS — M79662 Pain in left lower leg: Secondary | ICD-10-CM

## 2017-02-18 DIAGNOSIS — M25512 Pain in left shoulder: Secondary | ICD-10-CM | POA: Diagnosis not present

## 2017-02-18 DIAGNOSIS — S51802S Unspecified open wound of left forearm, sequela: Secondary | ICD-10-CM

## 2017-02-18 DIAGNOSIS — S81802S Unspecified open wound, left lower leg, sequela: Secondary | ICD-10-CM

## 2017-02-18 DIAGNOSIS — M25612 Stiffness of left shoulder, not elsewhere classified: Secondary | ICD-10-CM | POA: Diagnosis not present

## 2017-02-18 DIAGNOSIS — M25511 Pain in right shoulder: Secondary | ICD-10-CM | POA: Diagnosis not present

## 2017-02-18 DIAGNOSIS — M25611 Stiffness of right shoulder, not elsewhere classified: Secondary | ICD-10-CM | POA: Diagnosis not present

## 2017-02-18 DIAGNOSIS — R29898 Other symptoms and signs involving the musculoskeletal system: Secondary | ICD-10-CM | POA: Diagnosis not present

## 2017-02-18 NOTE — Telephone Encounter (Signed)
Patient's daughter called and canceled her OT appt for today and will bring her for PT only

## 2017-02-18 NOTE — Therapy (Signed)
[place], [place], [address] Phone: [phone]   Fax:  [phone]  Wound Care Therapy  Patient Details  Name: Bridget Ball MRN: [id number] Date of Birth: 09-Feb-1928 Referring Provider: Edwinna Areola hall   Encounter Date: 02/18/2017  PT End of Session - 02/18/17 1707    Visit Number  6    Number of Visits  8    Date for PT Re-Evaluation  02/25/17    Authorization Type  [employer] medicare    Authorization - Visit Number  6    Authorization - Number of Visits  8    PT Start Time  3546    PT Stop Time  1700    PT Time Calculation (min)  50 min    Activity Tolerance  Patient limited by pain    Behavior During Therapy  Anxious       Past Medical History:  Diagnosis Date  . Anxiety   . Arthritis   . Attention to nephrostomy ([place])    PT HAS NEPHROSTOMY TUBE IN PLACE  . Bruises easily   . Cancer [facility])    Skin cancer  . Chronic diarrhea   . Colitis   . COPD (chronic obstructive pulmonary disease) ([place])   . Dementia   . Depression   . GERD (gastroesophageal reflux disease)   . Hiatal hernia   . Hx of bronchitis   . Hx of pulmonary edema JULY 2015  . Hx of pulmonary edema JULY 2015  . Hx of septic shock JULY 2015  . Hydronephrosis, left   . Hyperlipidemia   . Memory difficulties   . Microscopic colitis   . Myocardial infarction [facility])    unknown time   . Renal insufficiency   . Seizures ([place])    last seizure was 4-5 years ago with "brain bleed"  . Shortness of breath    with exertion  . Stroke [facility])    TIA four years ago / stroke NOV 2014    Past Surgical History:  Procedure Laterality Date  . APPENDECTOMY    . BREAST BIOPSY Right 02/02/2014   Procedure: RIGHT BREAST BIOPSY;  Surgeon: Jamesetta So, MD;  Location: [facility] ORS;  Service: General;  Laterality: Right;  . COLONOSCOPY N/A 05/24/2013   Procedure: COLONOSCOPY;  Surgeon: Rogene Houston, MD;  Location: [facility] ENDO SUITE;  Service: Endoscopy;   Laterality: N/A;  100  . CYSTOSCOPY W/ URETERAL STENT PLACEMENT Left 08/06/2015   Procedure: CYSTOSCOPY WITH LEFT URETERAL STENT EXCHANGE - Sammie Bench;  Surgeon: Franchot Gallo, MD;  Location: [facility] ORS;  Service: Urology;  Laterality: Left;  . CYSTOSCOPY W/ URETERAL STENT PLACEMENT Left 07/07/2016   Procedure: CYSTOSCOPY, LEFT RETROGRADE PYELOGRAM WITH LEFT URETERAL STENT REPLACEMENT;  Surgeon: Franchot Gallo, MD;  Location: [facility] ORS;  Service: Urology;  Laterality: Left;  . CYSTOSCOPY WITH RETROGRADE PYELOGRAM, URETEROSCOPY AND STENT PLACEMENT Left 09/18/2013   Procedure: CYSTOSCOPY WITH RETROGRADE PYELOGRAM,  URETEROSCOPY AND  STENT PLACEMENT, removal of nephrostomy tube;  Surgeon: Jorja Loa, MD;  Location: [facility] ORS;  Service: Urology;  Laterality: Left;  . CYSTOSCOPY WITH STENT PLACEMENT Left 09/04/2014   Procedure: CYSTOSCOPY WITH LEFT J2 STENT EXCHANGE;  Surgeon: Franchot Gallo, MD;  Location: [facility] ORS;  Service: Urology;  Laterality: Left;  . ENDOVASCULAR REPAIR OF POPLITEAL ARTERY ANEURYSM  02/05/2016  . ESOPHAGOGASTRODUODENOSCOPY N/A 05/24/2013   Procedure: ESOPHAGOGASTRODUODENOSCOPY (EGD);  Surgeon: Rogene Houston, MD;  Location[facility];  Service: Endoscopy;  Laterality: N/A;  . FALSE ANEURYSM REPAIR Right 02/05/2016   Procedure: REPAIR RIGHT POLITEAL  ANEURYSM;  Surgeon: Serafina Mitchell, MD;  Location: [place];  Service: Vascular;  Laterality: Right;  . KNEE ARTHROSCOPY Right   . SKIN CANCER EXCISION    . TONSILLECTOMY    . TUBAL LIGATION      There were no vitals filed for this visit.              Wound Therapy - 02/18/17 1659    Subjective  pt returns today after several day hospital stay due to shortness of breath.  Returns today using QC to ambulate and on 2L02.  Pt reports no pain, however she is anxious that the woundcare will be painful.     Pain Assessment  No/denies pain    Faces Pain Scale  No hurt    Evaluation and Treatment Procedures Explained to  Patient/Family  Yes    Evaluation and Treatment Procedures  agreed to    Wound Properties Date First Assessed: 01/26/17 Time First Assessed: 4401 Wound Type: Laceration Location: Arm Location Orientation: Left Wound Description (Comments): Lt arm 2 wounds one 1.0x 1.5 the second 1.2 x 1.5 cm  Present on Admission: Yes   Dressing Type  Gauze (Comment)    Dressing Changed  Changed    Dressing Status  Intact;Old drainage    Dressing Change Frequency  PRN    Site / Wound Assessment  Granulation tissue;Friable    % Wound base Red or Granulating  100%    % Wound base Yellow/Fibrinous Exudate  0%    Wound Length (cm)  0.2 cm was 1.5 on 2/7    Wound Width (cm)  0.2 cm was 2 on 2/7    Wound Depth (cm)  0 cm    Wound Volume (cm^3)  0 cm^3    Wound Surface Area (cm^2)  0.04 cm^2    Drainage Amount  Scant    Drainage Description  Serous    Treatment  Cleansed;Debridement (Selective)    Wound Properties Date First Assessed: 01/26/17 Time First Assessed: 1527 Wound Type: Laceration Location: Leg Location Orientation: Left;Anterior Wound Description (Comments): on  shin Present on Admission: Yes   Dressing Type  Compression wrap    Dressing Changed  Changed    Dressing Status  Intact;Old drainage    Dressing Change Frequency  PRN    Site / Wound Assessment  Granulation tissue;Friable    % Wound base Red or Granulating  75%    % Wound base Yellow/Fibrinous Exudate  25%    Peri-wound Assessment  Intact    Wound Length (cm)  4 cm was 4.5 on 2/7    Wound Width (cm)  2 cm was 3 on 2/7    Wound Depth (cm)  0 cm    Wound Volume (cm^3)  0 cm^3    Wound Surface Area (cm^2)  8 cm^2    Drainage Amount  Minimal    Drainage Description  Serosanguineous    Treatment  Cleansed;Debridement (Selective)    Wound Properties Date First Assessed: 02/11/17 Time First Assessed: 1705 Wound Type: Other (Comment) Location: Elbow Location Orientation: Left Wound Description (Comments): tip of elbow  Present on Admission:  No   Dressing Type  Gauze (Comment)    Dressing Changed  Changed    Dressing Status  New drainage    Dressing Change Frequency  PRN    Site / Wound Assessment  Pale;Yellow    % Wound  base Red or Granulating  0%    % Wound base Yellow/Fibrinous Exudate  100%    Peri-wound Assessment  Edema;Erythema (blanchable)    Wound Length (cm)  0.5 cm was 0.5 cm on 2/7    Wound Width (cm)  0.5 cm was 0.8 cm on 2/7    Wound Depth (cm)  0.5 cm was 1cm on 2/7    Wound Volume (cm^3)  0.12 cm^3    Wound Surface Area (cm^2)  0.25 cm^2    Drainage Amount  Minimal    Drainage Description  Serous    Treatment  Cleansed;Debridement (Selective)    Incision Properties Date First Assessed: 02/05/16 Time First Assessed: 0932 Location: Leg Location Orientation: Right   Selective Debridement - Location  all wounds     Selective Debridement - Tools Used  Forceps gauze    Selective Debridement - Tissue Removed  slough and biofilm    Wound Therapy - Clinical Statement  removed mostly dry, dead skin from perimeter of all wounds.  Wounds on Lt forearm almost healed, elbow continues to have erythema and depth.  Packed this wound with medihoney gel and ocntinued with compression on Lt LE.  Pt tolerated debridment and dressings well.  Instructed to keep intact.     Wound Plan  continue with woundcare with addition of Lt elbow and appropriate dressing changes.    Dressing   Lt LE :  xeroform  followed by profore., #6 netting    Dressing  Lt UE: xeroform, gauze, medipore and #5 netting    Dressing  Lt elbow:  medihoney gauze packed into wound, 2X2 and medipore tape                PT Short Term Goals - 01/28/17 1223      PT SHORT TERM GOAL #1   Title  PT wound to be 80% granulated to reduce risk of cellulitis     Time  2    Period  Weeks    Status  On-going      PT SHORT TERM GOAL #2   Title  PT and daughter to be able to verbalize the risk of cellulitis; the signs and symptoms and the need to seek medical  attention     Time  2    Period  Weeks    Status  On-going      PT SHORT TERM GOAL #3   Title  Lt Leg wound size to be no greater than 5x3cm     Time  2    Period  Weeks    Status  On-going      PT SHORT TERM GOAL #4   Title  Smaller of Lt arm wound to be healed     Time  2    Period  Weeks    Status  On-going        PT Long Term Goals - 01/28/17 1223      PT LONG TERM GOAL #1   Title  Pt wound to be 100% granulated to reduce risk for cellulitis     Time  4    Period  Weeks    Status  On-going      PT LONG TERM GOAL #2   Title  Pt LT leg wound to be decreased in size to no greater than 2 x 2 cm to allow daughter to feel comfortable is self care for discharge.     Time  4    Period  Weeks    Status  On-going      PT LONG TERM GOAL #3   Title  PT Pain level with Left leg to be no greater than a 3/10 to allow pt to be able to walk for 5 minutes without increased pain.     Time  4    Period  Days    Status  On-going      PT LONG TERM GOAL #4   Title  PT Larger Lt arm wound to be healed     Time  4    Period  Weeks    Status  On-going              Patient will benefit from skilled therapeutic intervention in order to improve the following deficits and impairments:     Visit Diagnosis: Open wound of left forearm with complication, sequela  Pain in left lower leg  Unspecified open wound, left lower leg, sequela     Problem List Patient Active Problem List   Diagnosis Date Noted  . Acute respiratory failure with hypoxia ([place]) 02/15/2017  . Leg wound, left 02/15/2017  . History of stroke 10/20/2016  . TIA (transient ischemic attack) 10/12/2016  . Acute urinary retention 10/12/2016  . Popliteal artery aneurysm ([place]) 02/05/2016  . Malnutrition of moderate degree 12/15/2015  . DVT of popliteal vein ([place]) 12/14/2015  . Aneurysm of left internal iliac artery ([place]) 12/14/2015  . Sepsis affecting skin ([place]) 12/13/2015  . Dementia 12/13/2015  . GERD  (gastroesophageal reflux disease) 12/13/2015  . Severe protein-calorie malnutrition ([place]) 12/13/2015  . Cellulitis of right leg 11/24/2015  . Dyslipidemia 11/24/2015  . Erythema of lower extremity 11/24/2015  . Anemia 11/01/2015  . Esophageal reflux 08/17/2013  . COPD with acute exacerbation ([place]) 08/11/2013  . NSTEMI (non-ST elevated myocardial infarction) type 2 07/26/2013  . Dehydration 07/25/2013  . AKI (acute kidney injury) ([place]) 07/25/2013  . Colitis 07/08/2013  . Hydronephrosis, left 07/08/2013  . Microscopic colitis 06/27/2013  . Memory loss 04/25/2013  . Depression 04/25/2013  . SAH (subarachnoid hemorrhage) ([place]) 11/22/2012  . Seizure disorder secondary to prior [facility] 11/22/2012   Teena Irani, PTA/CLT [phone]  Teena Irani 02/18/2017, 5:08 PM  [facility] [address] [place], [place], [address] Phone: [phone]   Fax:  [phone]  Name: SAHARA FUJIMOTO MRN: [id number] Date of Birth: 1928/08/06

## 2017-02-18 NOTE — Telephone Encounter (Signed)
Pt needs to get oxgen regulated before she can do both wound and OT on the same day. We will go week by week to see when she can come for both

## 2017-02-19 DIAGNOSIS — J449 Chronic obstructive pulmonary disease, unspecified: Secondary | ICD-10-CM | POA: Diagnosis not present

## 2017-02-19 DIAGNOSIS — R269 Unspecified abnormalities of gait and mobility: Secondary | ICD-10-CM | POA: Diagnosis not present

## 2017-02-22 ENCOUNTER — Ambulatory Visit (HOSPITAL_COMMUNITY): Payer: Medicare Other | Admitting: Physical Therapy

## 2017-02-22 ENCOUNTER — Encounter (HOSPITAL_COMMUNITY): Payer: Medicare Other

## 2017-02-22 DIAGNOSIS — S51802S Unspecified open wound of left forearm, sequela: Secondary | ICD-10-CM

## 2017-02-22 DIAGNOSIS — M79662 Pain in left lower leg: Secondary | ICD-10-CM

## 2017-02-22 DIAGNOSIS — S81802S Unspecified open wound, left lower leg, sequela: Secondary | ICD-10-CM | POA: Diagnosis not present

## 2017-02-22 DIAGNOSIS — M25511 Pain in right shoulder: Secondary | ICD-10-CM | POA: Diagnosis not present

## 2017-02-22 DIAGNOSIS — M25611 Stiffness of right shoulder, not elsewhere classified: Secondary | ICD-10-CM | POA: Diagnosis not present

## 2017-02-22 DIAGNOSIS — R29898 Other symptoms and signs involving the musculoskeletal system: Secondary | ICD-10-CM | POA: Diagnosis not present

## 2017-02-22 DIAGNOSIS — M25612 Stiffness of left shoulder, not elsewhere classified: Secondary | ICD-10-CM | POA: Diagnosis not present

## 2017-02-22 DIAGNOSIS — M25512 Pain in left shoulder: Secondary | ICD-10-CM | POA: Diagnosis not present

## 2017-02-22 NOTE — Therapy (Signed)
[place], [place], [address] Phone: [phone]   Fax:  [phone]  Wound Care Therapy  Patient Details  Name: Bridget Ball MRN: [id number] Date of Birth: [birth year] Referring Provider: Edwinna Areola hall   Encounter Date: 02/22/2017  PT End of Session - 02/22/17 1634    Visit Number  7    Number of Visits  8    Date for PT Re-Evaluation  02/25/17    Authorization Type  [employer] medicare    Authorization - Visit Number  7    Authorization - Number of Visits  8    PT Start Time  1525    PT Stop Time  1600    PT Time Calculation (min)  35 min    Equipment Utilized During Treatment  Oxygen    Activity Tolerance  Patient tolerated treatment well    Behavior During Therapy  Anxious       Past Medical History:  Diagnosis Date  . Anxiety   . Arthritis   . Attention to nephrostomy ([place])    PT HAS NEPHROSTOMY TUBE IN PLACE  . Bruises easily   . Cancer [facility])    Skin cancer  . Chronic diarrhea   . Colitis   . COPD (chronic obstructive pulmonary disease) ([place])   . Dementia   . Depression   . GERD (gastroesophageal reflux disease)   . Hiatal hernia   . Hx of bronchitis   . Hx of pulmonary edema JULY 2015  . Hx of pulmonary edema JULY 2015  . Hx of septic shock JULY 2015  . Hydronephrosis, left   . Hyperlipidemia   . Memory difficulties   . Microscopic colitis   . Myocardial infarction [facility])    unknown time   . Renal insufficiency   . Seizures ([place])    last seizure was 4-5 years ago with "brain bleed"  . Shortness of breath    with exertion  . Stroke [facility])    TIA four years ago / stroke NOV 2014    Past Surgical History:  Procedure Laterality Date  . APPENDECTOMY    . BREAST BIOPSY Right 02/02/2014   Procedure: RIGHT BREAST BIOPSY;  Surgeon: Jamesetta So, MD;  Location: [facility] ORS;  Service: General;  Laterality: Right;  . COLONOSCOPY N/A 05/24/2013   Procedure: COLONOSCOPY;  Surgeon: Rogene Houston, MD;   Location: [facility] ENDO SUITE;  Service: Endoscopy;  Laterality: N/A;  100  . CYSTOSCOPY W/ URETERAL STENT PLACEMENT Left 08/06/2015   Procedure: CYSTOSCOPY WITH LEFT URETERAL STENT EXCHANGE - Sammie Bench;  Surgeon: Franchot Gallo, MD;  Location: [facility] ORS;  Service: Urology;  Laterality: Left;  . CYSTOSCOPY W/ URETERAL STENT PLACEMENT Left 07/07/2016   Procedure: CYSTOSCOPY, LEFT RETROGRADE PYELOGRAM WITH LEFT URETERAL STENT REPLACEMENT;  Surgeon: Franchot Gallo, MD;  Location: [facility] ORS;  Service: Urology;  Laterality: Left;  . CYSTOSCOPY WITH RETROGRADE PYELOGRAM, URETEROSCOPY AND STENT PLACEMENT Left 09/18/2013   Procedure: CYSTOSCOPY WITH RETROGRADE PYELOGRAM,  URETEROSCOPY AND  STENT PLACEMENT, removal of nephrostomy tube;  Surgeon: Jorja Loa, MD;  Location: [facility];  Service: Urology;  Laterality: Left;  . CYSTOSCOPY WITH STENT PLACEMENT Left 09/04/2014   Procedure: CYSTOSCOPY WITH LEFT J2 STENT EXCHANGE;  Surgeon: Franchot Gallo, MD;  Location: [facility] ORS;  Service: Urology;  Laterality: Left;  . ENDOVASCULAR REPAIR OF POPLITEAL ARTERY ANEURYSM  02/05/2016  . ESOPHAGOGASTRODUODENOSCOPY N/A 05/24/2013   Procedure: ESOPHAGOGASTRODUODENOSCOPY (EGD);  Surgeon: Rogene Houston,  MD;  Location: [facility] END[facility] SU[facility];  Service: Endoscopy;  Laterality: N/A;  . FALSE ANEURYSM REPAIR Right 02/05/2016   Procedure: REPAIR RIGHT POLITEAL  ANEURYSM;  Surgeon: Serafina Mitchell, MD;  Location: [place];  Service: Vascular;  Laterality: Right;  . KNEE ARTHROSCOPY Right   . SKIN CANCER EXCISION    . TONSILLECTOMY    . TUBAL LIGATION      There were no vitals filed for this visit.              Wound Therapy - 02/22/17 1628    Subjective  pt able to keep dressings intact and has no pain or issues today.    Pain Assessment  No/denies pain    Faces Pain Scale  No hurt    Evaluation and Treatment Procedures Explained to Patient/Family  Yes    Evaluation and Treatment Procedures  agreed to    Wound Properties  Date First Assessed: 01/26/17 Time First Assessed: 4315 Wound Type: Laceration Location: Arm Location Orientation: Left Wound Description (Comments): Lt arm 2 wounds one 1.0x 1.5 the second 1.2 x 1.5 cm  Present on Admission: Yes   Dressing Type  Gauze (Comment)    Dressing Status  --    Dressing Change Frequency  --    Site / Wound Assessment  --    % Wound base Red or Granulating  100%    % Wound base Yellow/Fibrinous Exudate  --    Drainage Amount  None    Drainage Description  --    Treatment  Cleansed    Wound Properties Date First Assessed: 01/26/17 Time First Assessed: 1527 Wound Type: Laceration Location: Leg Location Orientation: Left;Anterior Wound Description (Comments): on  shin Present on Admission: Yes   Dressing Type  Compression wrap    Dressing Changed  Changed    Dressing Status  Intact;Old drainage    Dressing Change Frequency  PRN    Site / Wound Assessment  Granulation tissue;Friable    % Wound base Red or Granulating  85%    % Wound base Yellow/Fibrinous Exudate  15%    Peri-wound Assessment  Intact    Drainage Amount  Minimal    Drainage Description  Serous    Treatment  Cleansed;Debridement (Selective)    Wound Properties Date First Assessed: 02/11/17 Time First Assessed: 1705 Wound Type: Other (Comment) Location: Elbow Location Orientation: Left Wound Description (Comments): tip of elbow  Present on Admission: No   Dressing Type  Gauze (Comment)    Dressing Changed  Changed    Dressing Status  New drainage    Dressing Change Frequency  PRN    Site / Wound Assessment  Pale;Yellow    % Wound base Red or Granulating  10%    % Wound base Yellow/Fibrinous Exudate  90%    Peri-wound Assessment  Edema;Erythema (blanchable)    Drainage Amount  Minimal    Drainage Description  Purulent    Treatment  Cleansed;Debridement (Selective)    Incision Properties Date First Assessed: 02/05/16 Time First Assessed: 0932 Location: Leg Location Orientation: Right   Selective  Debridement - Location  elbow and LE    Selective Debridement - Tools Used  Forceps gauze    Selective Debridement - Tissue Removed  slough and biofilm    Wound Therapy - Clinical Statement  Lt forearm is now completely healed.  skin flap is present but without open areas or broken skin. Instructed to cleanse this area and keep moisturized and protected.  Elbow with increased drainage today, purulent but without odor or signs or infection.  Redness and edema perimeter is much improved.  Lt LE with increased approximation this session and healing well.  conitnued with xeroform and profore to Lt LE and medihoney gauze to elbow.      Wound Plan  continue with woundcare with appropriate dressing changes.    Dressing   Lt LE :  xeroform  followed by profore., #6 netting    Dressing  --    Dressing  Lt elbow: medihoney gauze, medipore tape                PT Short Term Goals - 01/28/17 1223      PT SHORT TERM GOAL #1   Title  PT wound to be 80% granulated to reduce risk of cellulitis     Time  2    Period  Weeks    Status  On-going      PT SHORT TERM GOAL #2   Title  PT and daughter to be able to verbalize the risk of cellulitis; the signs and symptoms and the need to seek medical attention     Time  2    Period  Weeks    Status  On-going      PT SHORT TERM GOAL #3   Title  Lt Leg wound size to be no greater than 5x3cm     Time  2    Period  Weeks    Status  On-going      PT SHORT TERM GOAL #4   Title  Smaller of Lt arm wound to be healed     Time  2    Period  Weeks    Status  On-going        PT Long Term Goals - 01/28/17 1223      PT LONG TERM GOAL #1   Title  Pt wound to be 100% granulated to reduce risk for cellulitis     Time  4    Period  Weeks    Status  On-going      PT LONG TERM GOAL #2   Title  Pt LT leg wound to be decreased in size to no greater than 2 x 2 cm to allow daughter to feel comfortable is self care for discharge.     Time  4    Period  Weeks     Status  On-going      PT LONG TERM GOAL #3   Title  PT Pain level with Left leg to be no greater than a 3/10 to allow pt to be able to walk for 5 minutes without increased pain.     Time  4    Period  Days    Status  On-going      PT LONG TERM GOAL #4   Title  PT Larger Lt arm wound to be healed     Time  4    Period  Weeks    Status  On-going              Patient will benefit from skilled therapeutic intervention in order to improve the following deficits and impairments:     Visit Diagnosis: Open wound of left forearm with complication, sequela  Pain in left lower leg  Unspecified open wound, left lower leg, sequela     Problem List Patient Active Problem List   Diagnosis Date Noted  . Acute respiratory failure with  hypoxia ([place]) 02/15/2017  . Leg wound, left 02/15/2017  . History of stroke 10/20/2016  . TIA (transient ischemic attack) 10/12/2016  . Acute urinary retention 10/12/2016  . Popliteal artery aneurysm ([place]) 02/05/2016  . Malnutrition of moderate degree 12/15/2015  . DVT of popliteal vein ([place]) 12/14/2015  . Aneurysm of left internal iliac artery ([place]) 12/14/2015  . Sepsis affecting skin ([place]) 12/13/2015  . Dementia 12/13/2015  . GERD (gastroesophageal reflux disease) 12/13/2015  . Severe protein-calorie malnutrition ([place]) 12/13/2015  . Cellulitis of right leg 11/24/2015  . Dyslipidemia 11/24/2015  . Erythema of lower extremity 11/24/2015  . Anemia 11/01/2015  . Esophageal reflux 08/17/2013  . COPD with acute exacerbation ([place]) 08/11/2013  . NSTEMI (non-ST elevated myocardial infarction) type 2 07/26/2013  . Dehydration 07/25/2013  . AKI (acute kidney injury) ([place]) 07/25/2013  . Colitis 07/08/2013  . Hydronephrosis, left 07/08/2013  . Microscopic colitis 06/27/2013  . Memory loss 04/25/2013  . Depression 04/25/2013  . SAH (subarachnoid hemorrhage) ([place]) 11/22/2012  . Seizure disorder secondary to prior [facility] 11/22/2012   Teena Irani,  PTA/CLT [phone]  Teena Irani 02/22/2017, 4:36 PM  [place] [address] [place], [place], [address] Phone: [phone]   Fax:  [phone]  Name: Bridget Ball MRN: [id number] Date of Birth: 1928-04-09

## 2017-02-23 DIAGNOSIS — Z8669 Personal history of other diseases of the nervous system and sense organs: Secondary | ICD-10-CM | POA: Diagnosis not present

## 2017-02-23 DIAGNOSIS — Z86718 Personal history of other venous thrombosis and embolism: Secondary | ICD-10-CM | POA: Diagnosis not present

## 2017-02-23 DIAGNOSIS — J441 Chronic obstructive pulmonary disease with (acute) exacerbation: Secondary | ICD-10-CM | POA: Diagnosis not present

## 2017-02-23 DIAGNOSIS — I679 Cerebrovascular disease, unspecified: Secondary | ICD-10-CM | POA: Diagnosis not present

## 2017-02-23 DIAGNOSIS — L539 Erythematous condition, unspecified: Secondary | ICD-10-CM | POA: Diagnosis not present

## 2017-02-23 DIAGNOSIS — L819 Disorder of pigmentation, unspecified: Secondary | ICD-10-CM | POA: Diagnosis not present

## 2017-02-25 ENCOUNTER — Ambulatory Visit (HOSPITAL_COMMUNITY): Payer: Medicare Other | Admitting: Physical Therapy

## 2017-02-25 ENCOUNTER — Ambulatory Visit (HOSPITAL_COMMUNITY): Payer: Medicare Other

## 2017-02-25 ENCOUNTER — Encounter (HOSPITAL_COMMUNITY): Payer: Self-pay

## 2017-02-25 ENCOUNTER — Other Ambulatory Visit: Payer: Self-pay

## 2017-02-25 DIAGNOSIS — R29898 Other symptoms and signs involving the musculoskeletal system: Secondary | ICD-10-CM | POA: Diagnosis not present

## 2017-02-25 DIAGNOSIS — S51802S Unspecified open wound of left forearm, sequela: Secondary | ICD-10-CM

## 2017-02-25 DIAGNOSIS — M25612 Stiffness of left shoulder, not elsewhere classified: Secondary | ICD-10-CM

## 2017-02-25 DIAGNOSIS — M79662 Pain in left lower leg: Secondary | ICD-10-CM

## 2017-02-25 DIAGNOSIS — S81802S Unspecified open wound, left lower leg, sequela: Secondary | ICD-10-CM | POA: Diagnosis not present

## 2017-02-25 DIAGNOSIS — M25611 Stiffness of right shoulder, not elsewhere classified: Secondary | ICD-10-CM | POA: Diagnosis not present

## 2017-02-25 DIAGNOSIS — M25512 Pain in left shoulder: Secondary | ICD-10-CM

## 2017-02-25 DIAGNOSIS — M25511 Pain in right shoulder: Secondary | ICD-10-CM

## 2017-02-25 NOTE — Therapy (Signed)
[place], [place], [address] Phone: [phone]   Fax:  [phone]  Wound Care Therapy  Patient Details  Name: Bridget Ball MRN: [id number] Date of Birth: [birth year] Referring Provider: Edwinna Areola hall   Encounter Date: 02/25/2017  PT End of Session - 02/25/17 1645    Visit Number  8    Number of Visits  8    Date for PT Re-Evaluation  02/25/17    Authorization Type  [employer] medicare    Authorization - Visit Number  8    Authorization - Number of Visits  8    PT Start Time  6962    PT Stop Time  1635    PT Time Calculation (min)  40 min    Equipment Utilized During Treatment  Oxygen    Activity Tolerance  Patient tolerated treatment well    Behavior During Therapy  Anxious       Past Medical History:  Diagnosis Date  . Anxiety   . Arthritis   . Attention to nephrostomy ([place])    PT HAS NEPHROSTOMY TUBE IN PLACE  . Bruises easily   . Cancer [facility])    Skin cancer  . Chronic diarrhea   . Colitis   . COPD (chronic obstructive pulmonary disease) ([place])   . Dementia   . Depression   . GERD (gastroesophageal reflux disease)   . Hiatal hernia   . Hx of bronchitis   . Hx of pulmonary edema JULY 2015  . Hx of pulmonary edema JULY 2015  . Hx of septic shock JULY 2015  . Hydronephrosis, left   . Hyperlipidemia   . Memory difficulties   . Microscopic colitis   . Myocardial infarction [facility])    unknown time   . Renal insufficiency   . Seizures ([place])    last seizure was 4-5 years ago with "brain bleed"  . Shortness of breath    with exertion  . Stroke [facility])    TIA four years ago / stroke NOV 2014    Past Surgical History:  Procedure Laterality Date  . APPENDECTOMY    . BREAST BIOPSY Right 02/02/2014   Procedure: RIGHT BREAST BIOPSY;  Surgeon: Jamesetta So, MD;  Location: [facility] ORS;  Service: General;  Laterality: Right;  . COLONOSCOPY N/A 05/24/2013   Procedure: COLONOSCOPY;  Surgeon: Rogene Houston, MD;   Location: [facility];  Service: Endoscopy;  Laterality: N/A;  100  . CYSTOSCOPY W/ URETERAL STENT PLACEMENT Left 08/06/2015   Procedure: CYSTOSCOPY WITH LEFT URETERAL STENT EXCHANGE - Sammie Bench;  Surgeon: Franchot Gallo, MD;  Location: [facility];  Service: Urology;  Laterality: Left;  . CYSTOSCOPY W/ URETERAL STENT PLACEMENT Left 07/07/2016   Procedure: CYSTOSCOPY, LEFT RETROGRADE PYELOGRAM WITH LEFT URETERAL STENT REPLACEMENT;  Surgeon: Franchot Gallo, MD;  Location: [facility];  Service: Urology;  Laterality: Left;  . CYSTOSCOPY WITH RETROGRADE PYELOGRAM, URETEROSCOPY AND STENT PLACEMENT Left 09/18/2013   Procedure: CYSTOSCOPY WITH RETROGRADE PYELOGRAM,  URETEROSCOPY AND  STENT PLACEMENT, removal of nephrostomy tube;  Surgeon: Jorja Loa, MD;  Location[facility];  Service: Urology;  Laterality: Left;  . CYSTOSCOPY WITH STENT PLACEMENT Left 09/04/2014   Procedure: CYSTOSCOPY WITH LEFT J2 STENT EXCHANGE;  Surgeon: Franchot Gallo, MD;  Location: [facility];  Service: Urology;  Laterality: Left;  . ENDOVASCULAR REPAIR OF POPLITEAL ARTERY ANEURYSM  02/05/2016  . ESOPHAGOGASTRODUODENOSCOPY N/A 05/24/2013   Procedure: ESOPHAGOGASTRODUODENOSCOPY (EGD);  Surgeon: Rogene Houston,  MD;  Location[facility];  Service: Endoscopy;  Laterality: N/A;  . FALSE ANEURYSM REPAIR Right 02/05/2016   Procedure: REPAIR RIGHT POLITEAL  ANEURYSM;  Surgeon: Serafina Mitchell, MD;  Location: [place];  Service: Vascular;  Laterality: Right;  . KNEE ARTHROSCOPY Right   . SKIN CANCER EXCISION    . TONSILLECTOMY    . TUBAL LIGATION      There were no vitals filed for this visit.              Wound Therapy - 02/25/17 1640    Subjective  dressing off of Lt elbow but LE intact.  Reports no pain just fatigue and short of breath.    Pain Assessment  No/denies pain    Faces Pain Scale  No hurt    Evaluation and Treatment Procedures Explained to Patient/Family  Yes    Evaluation and Treatment Procedures  agreed to     Wound Properties Date First Assessed: 01/26/17 Time First Assessed: 1527 Wound Type: Laceration Location: Leg Location Orientation: Left;Anterior Wound Description (Comments): on  shin Present on Admission: Yes   Dressing Type  Compression wrap    Dressing Changed  Changed    Dressing Status  Intact;Old drainage    Dressing Change Frequency  PRN    Site / Wound Assessment  Granulation tissue;Friable    % Wound base Red or Granulating  85%    % Wound base Yellow/Fibrinous Exudate  15%    Peri-wound Assessment  Intact    Wound Length (cm)  4 cm    Wound Width (cm)  1.5 cm    Wound Depth (cm)  0 cm    Wound Volume (cm^3)  0 cm^3    Wound Surface Area (cm^2)  6 cm^2    Drainage Amount  Scant    Drainage Description  Serous    Treatment  Cleansed;Debridement (Selective)    Wound Properties Date First Assessed: 02/11/17 Time First Assessed: 1705 Wound Type: Other (Comment) Location: Elbow Location Orientation: Left Wound Description (Comments): tip of elbow  Present on Admission: No   Dressing Type  None    Dressing Changed  Changed    Dressing Status  New drainage    Dressing Change Frequency  PRN    Site / Wound Assessment  Pale;Yellow    % Wound base Red or Granulating  10%    % Wound base Yellow/Fibrinous Exudate  90%    Peri-wound Assessment  Edema;Erythema (blanchable)    Wound Length (cm)  0.5 cm    Wound Width (cm)  0.5 cm    Wound Depth (cm)  0.5 cm    Wound Volume (cm^3)  0.12 cm^3    Wound Surface Area (cm^2)  0.25 cm^2    Drainage Amount  Minimal    Drainage Description  Serosanguineous    Treatment  Cleansed;Debridement (Selective)    Wound Properties Date First Assessed: 01/26/17 Time First Assessed: 2992 Wound Type: Laceration Location: Arm Location Orientation: Left Wound Description (Comments): Lt arm 2 wounds one 1.0x 1.5 the second 1.2 x 1.5 cm  Present on Admission: Yes Final Assessment Date: 02/25/17   Dressing Type  --    % Wound base Red or Granulating  --     Drainage Amount  --    Incision Properties Date First Assessed: 02/05/16 Time First Assessed: 0932 Location: Leg Location Orientation: Right   Selective Debridement - Location  elbow and LE    Selective Debridement - Tools Used  Forceps gauze    Selective Debridement - Tissue Removed  slough and biofilm    Wound Therapy - Clinical Statement  Lt forearm is now completely healed.  No dressing present on Lt elbow, however minimal drainage present.  Size remains the same as compared to last measurement.  Lt LE wound is healing well with overall improved integrity of skin.  Continued with profore on Lt LE.     Wound Therapy - Functional Problem List  difficulty walking due to pain     Factors Delaying/Impairing Wound Healing  Altered sensation;Incontinence;Multiple medical problems;Polypharmacy;Vascular compromise    Hydrotherapy Plan  Debridement;Dressing change    Wound Therapy - Frequency  2X / week    Wound Therapy - Current Recommendations  OT currently seeing OT for UE strength    Wound Plan  continue with woundcare with appropriate dressing changes.    Dressing   Lt LE :  xeroform  followed by profore., #6 netting    Dressing  Lt elbow:  medihoney gel and medipore tape                PT Short Term Goals - 02/25/17 1647      PT SHORT TERM GOAL #1   Title  PT wound to be 80% granulated to reduce risk of cellulitis     Baseline  -- LE but not elbow    Time  2    Period  Weeks    Status  Partially Met      PT SHORT TERM GOAL #2   Title  PT and daughter to be able to verbalize the risk of cellulitis; the signs and symptoms and the need to seek medical attention     Time  2    Period  Weeks    Status  Achieved      PT SHORT TERM GOAL #3   Title  Lt Leg wound size to be no greater than 5x3cm     Time  2    Period  Weeks    Status  Achieved      PT SHORT TERM GOAL #4   Title  Smaller of Lt arm wound to be healed     Time  2    Period  Weeks    Status  Achieved         PT Long Term Goals - 02/25/17 1648      PT LONG TERM GOAL #1   Title  Pt wound to be 100% granulated to reduce risk for cellulitis     Time  4    Period  Weeks    Status  On-going      PT LONG TERM GOAL #2   Title  Pt LT leg wound to be decreased in size to no greater than 2 x 2 cm to allow daughter to feel comfortable is self care for discharge.     Time  4    Period  Weeks    Status  On-going      PT LONG TERM GOAL #3   Title  PT Pain level with Left leg to be no greater than a 3/10 to allow pt to be able to walk for 5 minutes without increased pain.     Time  4    Period  Days    Status  Achieved      PT LONG TERM GOAL #4   Title  PT Larger Lt arm wound to be healed  Time  4    Period  Weeks    Status  Achieved              Patient will benefit from skilled therapeutic intervention in order to improve the following deficits and impairments:     Visit Diagnosis: Open wound of left forearm with complication, sequela  Pain in left lower leg  Unspecified open wound, left lower leg, sequela     Problem List Patient Active Problem List   Diagnosis Date Noted  . Acute respiratory failure with hypoxia ([place]) 02/15/2017  . Leg wound, left 02/15/2017  . History of stroke 10/20/2016  . TIA (transient ischemic attack) 10/12/2016  . Acute urinary retention 10/12/2016  . Popliteal artery aneurysm ([place]) 02/05/2016  . Malnutrition of moderate degree 12/15/2015  . DVT of popliteal vein ([place]) 12/14/2015  . Aneurysm of left internal iliac artery ([place]) 12/14/2015  . Sepsis affecting skin ([place]) 12/13/2015  . Dementia 12/13/2015  . GERD (gastroesophageal reflux disease) 12/13/2015  . Severe protein-calorie malnutrition ([place]) 12/13/2015  . Cellulitis of right leg 11/24/2015  . Dyslipidemia 11/24/2015  . Erythema of lower extremity 11/24/2015  . Anemia 11/01/2015  . Esophageal reflux 08/17/2013  . COPD with acute exacerbation ([place]) 08/11/2013  . NSTEMI  (non-ST elevated myocardial infarction) type 2 07/26/2013  . Dehydration 07/25/2013  . AKI (acute kidney injury) ([place]) 07/25/2013  . Colitis 07/08/2013  . Hydronephrosis, left 07/08/2013  . Microscopic colitis 06/27/2013  . Memory loss 04/25/2013  . Depression 04/25/2013  . SAH (subarachnoid hemorrhage) ([place]) 11/22/2012  . Seizure disorder secondary to prior [facility] 11/22/2012   Teena Irani, PTA/CLT [phone]  Teena Irani 02/25/2017, 4:48 PM  [facility] [address] [place], [place], [address] Phone: [phone]   Fax:  [phone]  Name: Bridget Ball MRN: [id number] Date of Birth: [birth year]

## 2017-02-25 NOTE — Therapy (Signed)
[place], [place], [address] Phone: [phone]   Fax:  [phone]  Occupational Therapy Treatment  Patient Details  Name: Bridget Ball MRN: [id number] Date of Birth: [birth year] Referring Provider: Edwinna Areola hall   Encounter Date: 02/25/2017  OT End of Session - 02/25/17 1709    Visit Number  7    Number of Visits  12    Date for OT Re-Evaluation  03/05/17    Authorization Type  [employer] medicare    OT Start Time  2229    OT Stop Time  1730    OT Time Calculation (min)  46 min    Activity Tolerance  Patient tolerated treatment well    Behavior During Therapy  [facility] for tasks assessed/performed;Anxious       Past Medical History:  Diagnosis Date  . Anxiety   . Arthritis   . Attention to nephrostomy ([place])    PT HAS NEPHROSTOMY TUBE IN PLACE  . Bruises easily   . Cancer [facility])    Skin cancer  . Chronic diarrhea   . Colitis   . COPD (chronic obstructive pulmonary disease) ([place])   . Dementia   . Depression   . GERD (gastroesophageal reflux disease)   . Hiatal hernia   . Hx of bronchitis   . Hx of pulmonary edema JULY 2015  . Hx of pulmonary edema JULY 2015  . Hx of septic shock JULY 2015  . Hydronephrosis, left   . Hyperlipidemia   . Memory difficulties   . Microscopic colitis   . Myocardial infarction [facility])    unknown time   . Renal insufficiency   . Seizures ([place])    last seizure was 4-5 years ago with "brain bleed"  . Shortness of breath    with exertion  . Stroke [facility])    TIA four years ago / stroke NOV 2014    Past Surgical History:  Procedure Laterality Date  . APPENDECTOMY    . BREAST BIOPSY Right 02/02/2014   Procedure: RIGHT BREAST BIOPSY;  Surgeon: Jamesetta So, MD;  Location: [facility] ORS;  Service: General;  Laterality: Right;  . COLONOSCOPY N/A 05/24/2013   Procedure: COLONOSCOPY;  Surgeon: Rogene Houston, MD;  Location: [facility] ENDO SUITE;  Service: Endoscopy;  Laterality: N/A;  100  .  CYSTOSCOPY W/ URETERAL STENT PLACEMENT Left 08/06/2015   Procedure: CYSTOSCOPY WITH LEFT URETERAL STENT EXCHANGE - Sammie Bench;  Surgeon: Franchot Gallo, MD;  Location: [facility] ORS;  Service: Urology;  Laterality: Left;  . CYSTOSCOPY W/ URETERAL STENT PLACEMENT Left 07/07/2016   Procedure: CYSTOSCOPY, LEFT RETROGRADE PYELOGRAM WITH LEFT URETERAL STENT REPLACEMENT;  Surgeon: Franchot Gallo, MD;  Location: [facility] ORS;  Service: Urology;  Laterality: Left;  . CYSTOSCOPY WITH RETROGRADE PYELOGRAM, URETEROSCOPY AND STENT PLACEMENT Left 09/18/2013   Procedure: CYSTOSCOPY WITH RETROGRADE PYELOGRAM,  URETEROSCOPY AND  STENT PLACEMENT, removal of nephrostomy tube;  Surgeon: Jorja Loa, MD;  Location: [facility] ORS;  Service: Urology;  Laterality: Left;  . CYSTOSCOPY WITH STENT PLACEMENT Left 09/04/2014   Procedure: CYSTOSCOPY WITH LEFT J2 STENT EXCHANGE;  Surgeon: Franchot Gallo, MD;  Location: [facility] ORS;  Service: Urology;  Laterality: Left;  . ENDOVASCULAR REPAIR OF POPLITEAL ARTERY ANEURYSM  02/05/2016  . ESOPHAGOGASTRODUODENOSCOPY N/A 05/24/2013   Procedure: ESOPHAGOGASTRODUODENOSCOPY (EGD);  Surgeon: Rogene Houston, MD;  Location: [facility] ENDO SUITE;  Service: Endoscopy;  Laterality: N/A;  . FALSE ANEURYSM REPAIR Right 02/05/2016   Procedure: REPAIR RIGHT POLITEAL  ANEURYSM;  Surgeon: Serafina Mitchell, MD;  Location: [place];  Service: Vascular;  Laterality: Right;  . KNEE ARTHROSCOPY Right   . SKIN CANCER EXCISION    . TONSILLECTOMY    . TUBAL LIGATION      There were no vitals filed for this visit.  Subjective Assessment - 02/25/17 1647    Subjective   S: Daughter reports that she is on Oxygen (3L ) although it hasn't really been helping.     Currently in Pain?  Yes    Pain Score  7     Pain Location  Shoulder    Pain Orientation  Left;Right    Pain Descriptors / Indicators  Sore    Pain Type  Chronic pain    Pain Radiating Towards  N/A    Pain Onset  More than a month ago    Pain Frequency  Occasional     Aggravating Factors   movement and use    Pain Relieving Factors  rest    Effect of Pain on Daily Activities  mod effect. She will stop due to pain    Multiple Pain Sites  No         [facility] OT Assessment - 02/25/17 1652      Assessment   Medical Diagnosis  Bilateral Shoulder pain      Precautions   Precautions  Fall           OT Treatments/Exercises (OP) - 02/25/17 1652      Exercises   Exercises  Shoulder      Shoulder Exercises: Supine   Protraction  AAROM;10 reps    Horizontal ABduction  AAROM;10 reps    External Rotation  AROM;10 reps    Internal Rotation  AROM;10 reps    Flexion  AAROM;10 reps    ABduction  AAROM;10 reps;Left    ABduction Limitations  therapist assisted      Shoulder Exercises: Seated   Protraction  AAROM;10 reps    Horizontal ABduction  AROM;10 reps    External Rotation  AROM;10 reps    Internal Rotation  AROM;10 reps    Flexion  AAROM;10 reps      Shoulder Exercises: Therapy Ball   Right/Left  5 reps to the right only      Functional Reaching Activities   Mid Level  Pt completed functional reaching task using resistive clothespins. All yellow, red, and green pins placed on vertical pole with Right UE. Patient was able to remove all yellow and half of red with left UE. Increased time needed to complete task.               OT Short Term Goals - 01/27/17 1501      OT SHORT TERM GOAL #1   Title  Patient will be educated and independent with HEP in order to faciliate progress in therapy and allow her to use her BUE's with increased comfort during daily tasks.     Time  3    Period  Weeks    Status  On-going      OT SHORT TERM GOAL #2   Title  Patient will increase BUE P/ROM to [facility] to increase ability to complete dressing tasks with less difficulty.     Time  3    Period  Weeks    Status  On-going      OT SHORT TERM GOAL #3   Title  Patient will increase BUE strength to 4-/5 to increase ability to  complete normal household  tasks with greater strength and ease.     Time  3    Period  Weeks    Status  On-going      OT SHORT TERM GOAL #4   Title  Patient will report a decreased pain level of 5/10 in BUE's while completing daily tasks.     Time  3    Period  Weeks    Status  On-going        OT Long Term Goals - 01/27/17 1501      OT LONG TERM GOAL #1   Title  Patient will return to highest level of independence while completing all daily tasks using BUE's with reports of decreased pain of 3/10 or less.     Time  6    Period  Weeks    Status  On-going      OT LONG TERM GOAL #2   Title  Patient will increase A/ROM of RUE to [facility] to increase ability to return to using right arm as dominant extremity.     Time  6    Period  Weeks    Status  On-going      OT LONG TERM GOAL #3   Title  Patient will increase BUE strength to 4/10 overall in order to complete normal household tasks with less difficulty.     Time  6    Period  Weeks    Status  On-going      OT LONG TERM GOAL #4   Title  Patient will decrease fasical restrictions in min amount or less in order to increase functional mobility needed for reaching overhead.     Time  6    Period  Weeks    Status  On-going            Plan - 02/25/17 1751    Clinical Impression Statement  A: patient now on 3L of continous oxygen. pt is noticably faster with ambulation and did not require as many rest breaks. Right UE is functional with mobility this date. Left UE is still showing deficits and is only able to complete approximately shoulder level flexion. Verbal and physical cues given as needed for form and technique.    Plan  P: D/C myofascial release and passive stretching. Add therapy ball circles to the left. Add ball circles around waist.     Consulted and Agree with Plan of Care  Patient;Family member/caregiver    Family Member Consulted  Daughter: Lattie Haw       Patient will benefit from skilled therapeutic intervention in order to improve the  following deficits and impairments:  Pain, Decreased range of motion, Increased fascial restrictions, Impaired UE functional use, Decreased strength  Visit Diagnosis: Stiffness of right shoulder, not elsewhere classified  Acute pain of right shoulder  Acute pain of left shoulder  Stiffness of left shoulder, not elsewhere classified  Other symptoms and signs involving the musculoskeletal system    Problem List Patient Active Problem List   Diagnosis Date Noted  . Acute respiratory failure with hypoxia ([place]) 02/15/2017  . Leg wound, left 02/15/2017  . History of stroke 10/20/2016  . TIA (transient ischemic attack) 10/12/2016  . Acute urinary retention 10/12/2016  . Popliteal artery aneurysm ([place]) 02/05/2016  . Malnutrition of moderate degree 12/15/2015  . DVT of popliteal vein ([place]) 12/14/2015  . Aneurysm of left internal iliac artery ([place]) 12/14/2015  . Sepsis affecting skin ([place]) 12/13/2015  . Dementia 12/13/2015  .  GERD (gastroesophageal reflux disease) 12/13/2015  . Severe protein-calorie malnutrition ([place]) 12/13/2015  . Cellulitis of right leg 11/24/2015  . Dyslipidemia 11/24/2015  . Erythema of lower extremity 11/24/2015  . Anemia 11/01/2015  . Esophageal reflux 08/17/2013  . COPD with acute exacerbation ([place]) 08/11/2013  . NSTEMI (non-ST elevated myocardial infarction) type 2 07/26/2013  . Dehydration 07/25/2013  . AKI (acute kidney injury) ([place]) 07/25/2013  . Colitis 07/08/2013  . Hydronephrosis, left 07/08/2013  . Microscopic colitis 06/27/2013  . Memory loss 04/25/2013  . Depression 04/25/2013  . SAH (subarachnoid hemorrhage) ([place]) 11/22/2012  . Seizure disorder secondary to prior [facility] 11/22/2012   Ailene Ravel, OTR/L,CBIS  [phone]  02/25/2017, 5:54 PM  [place] [address] [place], [place], [address] Phone: [phone]   Fax:  [phone]  Name: JOCELINE HINCHCLIFF MRN: [id number] Date of Birth:  02-26-1928

## 2017-03-01 ENCOUNTER — Ambulatory Visit (HOSPITAL_COMMUNITY): Payer: Medicare Other | Admitting: Physical Therapy

## 2017-03-01 ENCOUNTER — Encounter (HOSPITAL_COMMUNITY): Payer: Self-pay | Admitting: Physical Therapy

## 2017-03-01 ENCOUNTER — Ambulatory Visit (HOSPITAL_COMMUNITY): Payer: Medicare Other

## 2017-03-01 ENCOUNTER — Encounter (HOSPITAL_COMMUNITY): Payer: Self-pay

## 2017-03-01 ENCOUNTER — Other Ambulatory Visit: Payer: Self-pay

## 2017-03-01 DIAGNOSIS — S51802S Unspecified open wound of left forearm, sequela: Secondary | ICD-10-CM

## 2017-03-01 DIAGNOSIS — R29898 Other symptoms and signs involving the musculoskeletal system: Secondary | ICD-10-CM

## 2017-03-01 DIAGNOSIS — M25612 Stiffness of left shoulder, not elsewhere classified: Secondary | ICD-10-CM | POA: Diagnosis not present

## 2017-03-01 DIAGNOSIS — M25611 Stiffness of right shoulder, not elsewhere classified: Secondary | ICD-10-CM

## 2017-03-01 DIAGNOSIS — M25512 Pain in left shoulder: Secondary | ICD-10-CM | POA: Diagnosis not present

## 2017-03-01 DIAGNOSIS — S81802S Unspecified open wound, left lower leg, sequela: Secondary | ICD-10-CM

## 2017-03-01 DIAGNOSIS — M25511 Pain in right shoulder: Secondary | ICD-10-CM | POA: Diagnosis not present

## 2017-03-01 DIAGNOSIS — M79662 Pain in left lower leg: Secondary | ICD-10-CM | POA: Diagnosis not present

## 2017-03-01 NOTE — Therapy (Signed)
[place], [place], [address] Phone: [phone]   Fax:  [phone]  Wound Care Therapy  Patient Details  Name: Bridget Ball MRN: [id number] Date of Birth: Mar 03, 1928 Referring Provider: Edwinna Areola hall   Encounter Date: 03/01/2017  PT End of Session - 03/01/17 1645    Visit Number  9    Number of Visits  16    Date for PT Re-Evaluation  02/25/17    Authorization Type  [employer] medicare    Authorization - Visit Number  9    Authorization - Number of Visits  16    PT Start Time  4174    PT Stop Time  1600    PT Time Calculation (min)  45 min    Equipment Utilized During Treatment  Oxygen    Activity Tolerance  Patient tolerated treatment well    Behavior During Therapy  Anxious       Past Medical History:  Diagnosis Date  . Anxiety   . Arthritis   . Attention to nephrostomy ([place])    PT HAS NEPHROSTOMY TUBE IN PLACE  . Bruises easily   . Cancer [facility])    Skin cancer  . Chronic diarrhea   . Colitis   . COPD (chronic obstructive pulmonary disease) ([place])   . Dementia   . Depression   . GERD (gastroesophageal reflux disease)   . Hiatal hernia   . Hx of bronchitis   . Hx of pulmonary edema JULY 2015  . Hx of pulmonary edema JULY 2015  . Hx of septic shock JULY 2015  . Hydronephrosis, left   . Hyperlipidemia   . Memory difficulties   . Microscopic colitis   . Myocardial infarction [facility])    unknown time   . Renal insufficiency   . Seizures ([place])    last seizure was 4-5 years ago with "brain bleed"  . Shortness of breath    with exertion  . Stroke [facility])    TIA four years ago / stroke NOV 2014    Past Surgical History:  Procedure Laterality Date  . APPENDECTOMY    . BREAST BIOPSY Right 02/02/2014   Procedure: RIGHT BREAST BIOPSY;  Surgeon: Jamesetta So, MD;  Location: [facility] ORS;  Service: General;  Laterality: Right;  . COLONOSCOPY N/A 05/24/2013   Procedure: COLONOSCOPY;  Surgeon: Rogene Houston, MD;   Location: [facility] ENDO SUITE;  Service: Endoscopy;  Laterality: N/A;  100  . CYSTOSCOPY W/ URETERAL STENT PLACEMENT Left 08/06/2015   Procedure: CYSTOSCOPY WITH LEFT URETERAL STENT EXCHANGE - Sammie Bench;  Surgeon: Franchot Gallo, MD;  Location: [facility] ORS;  Service: Urology;  Laterality: Left;  . CYSTOSCOPY W/ URETERAL STENT PLACEMENT Left 07/07/2016   Procedure: CYSTOSCOPY, LEFT RETROGRADE PYELOGRAM WITH LEFT URETERAL STENT REPLACEMENT;  Surgeon: Franchot Gallo, MD;  Location: [facility] ORS;  Service: Urology;  Laterality: Left;  . CYSTOSCOPY WITH RETROGRADE PYELOGRAM, URETEROSCOPY AND STENT PLACEMENT Left 09/18/2013   Procedure: CYSTOSCOPY WITH RETROGRADE PYELOGRAM,  URETEROSCOPY AND  STENT PLACEMENT, removal of nephrostomy tube;  Surgeon: Jorja Loa, MD;  Location: [facility];  Service: Urology;  Laterality: Left;  . CYSTOSCOPY WITH STENT PLACEMENT Left 09/04/2014   Procedure: CYSTOSCOPY WITH LEFT J2 STENT EXCHANGE;  Surgeon: Franchot Gallo, MD;  Location: [facility] ORS;  Service: Urology;  Laterality: Left;  . ENDOVASCULAR REPAIR OF POPLITEAL ARTERY ANEURYSM  02/05/2016  . ESOPHAGOGASTRODUODENOSCOPY N/A 05/24/2013   Procedure: ESOPHAGOGASTRODUODENOSCOPY (EGD);  Surgeon: Rogene Houston,  MD;  Location: [facility] END[facility];  Service: Endoscopy;  Laterality: N/A;  . FALSE ANEURYSM REPAIR Right 02/05/2016   Procedure: REPAIR RIGHT POLITEAL  ANEURYSM;  Surgeon: Serafina Mitchell, MD;  Location: [place];  Service: Vascular;  Laterality: Right;  . KNEE ARTHROSCOPY Right   . SKIN CANCER EXCISION    . TONSILLECTOMY    . TUBAL LIGATION      There were no vitals filed for this visit.              Wound Therapy - 03/01/17 1635    Subjective  PT states that she is doing well     Patient and Family Stated Goals  less pain and for the wounds to be healed     Date of Onset  01/05/17    Prior Treatments  self care     Faces Pain Scale  No hurt Pain only upon debridement.     Evaluation and Treatment Procedures  Explained to Patient/Family  Yes    Evaluation and Treatment Procedures  agreed to    Wound Properties Date First Assessed: 01/26/17 Time First Assessed: 1527 Wound Type: Laceration Location: Leg Location Orientation: Left;Anterior Wound Description (Comments): on  shin Present on Admission: Yes   Dressing Type  Compression wrap    Dressing Changed  Changed    Dressing Status  Intact;Old drainage    Dressing Change Frequency  PRN    Site / Wound Assessment  Granulation tissue;Friable    % Wound base Red or Granulating  90% After debridement; prior 70% granulated with 30% yellow slou    % Wound base Yellow/Fibrinous Exudate  10%    % Wound base Black/Eschar  0%    % Wound base Other/Granulation Tissue (Comment)  30%    Peri-wound Assessment  Intact    Wound Length (cm)  4 cm was 4.5 at eval    Wound Width (cm)  1.5 cm was 7.5 at eval     Wound Surface Area (cm^2)  6 cm^2    Drainage Amount  Scant    Drainage Description  Serous    Treatment  Cleansed;Debridement (Selective);Other (Comment) compression dressing     Wound Properties Date First Assessed: 02/11/17 Time First Assessed: 1705 Wound Type: Other (Comment) Location: Elbow Location Orientation: Left Wound Description (Comments): tip of elbow  Present on Admission: No   Dressing Type  None;Gauze (Comment)    Dressing Changed  Changed    Dressing Status  New drainage    Dressing Change Frequency  PRN    Site / Wound Assessment  Pale;Yellow    % Wound base Red or Granulating  50%    % Wound base Yellow/Fibrinous Exudate  50%    % Wound base Black/Eschar  0%    Peri-wound Assessment  Edema;Erythema (blanchable)    Wound Length (cm)  0.7 cm    Wound Width (cm)  0.5 cm    Wound Depth (cm)  0.5 cm superior aspect of wound has a tunnel     Wound Volume (cm^3)  0.18 cm^3    Wound Surface Area (cm^2)  0.35 cm^2    Drainage Amount  Minimal    Drainage Description  Serosanguineous    Treatment  Hydrotherapy (Pulse lavage)    Incision  Properties Date First Assessed: 02/05/16 Time First Assessed: 0932 Location: Leg Location Orientation: Right   Dressing  --    Dressing Change Frequency  --    Site / Wound Assessment  --  Pulsed lavage therapy - wound location  Lt elbow     Pulsed Lavage with Suction (psi)  7 psi    Pulsed Lavage with Suction - Normal Saline Used  1000 mL    Pulsed Lavage Tip  Tip with splash shield    Selective Debridement - Location  elbow and LE    Selective Debridement - Tools Used  Forceps;Scalpel gauze    Selective Debridement - Tissue Removed  slough and biofilm    Wound Therapy - Clinical Statement  Lt elbow wound has tunneling superiorly as well as depth of .5 cm.  Due to small opening therapist opted to use Pulse Lavage to attempt a deeper cleaning.  LE wound continue to improve but had an increased amount of slough which was able to be debrided.  Pt will benefit from continued skilled Physical therapy especially for the elbow as pt has just recently been discharged with cellulitis from this wound.  The wound continues to have both tunneling and depth with red hallo of approximately 3cm diameter.      Wound Therapy - Functional Problem List  difficulty walking due to pain     Factors Delaying/Impairing Wound Healing  Altered sensation;Incontinence;Multiple medical problems;Polypharmacy;Vascular compromise    Hydrotherapy Plan  Debridement;Dressing change    Wound Therapy - Frequency  2X / week for an additional 4 weeks.;     Wound Therapy - Current Recommendations  OT currently seeing OT for UE strength    Wound Plan  Add pulse lavage as needed for elbow continue with cleansing and debridemtn until the wound is healed     Dressing   Lt LE :  xeroform  followed by profore., #6 netting    Dressing  Lt UE: xeroform, gauze, medipore and #5 netting                PT Short Term Goals - 03/01/17 1647      PT SHORT TERM GOAL #1   Title  PT wound to be 80% granulated to reduce risk of  cellulitis     Baseline  -- LE but not elbow    Time  2    Period  Weeks    Status  Partially Met      PT SHORT TERM GOAL #2   Title  PT and daughter to be able to verbalize the risk of cellulitis; the signs and symptoms and the need to seek medical attention     Time  2    Period  Weeks    Status  Achieved      PT SHORT TERM GOAL #3   Title  Lt Leg wound size to be no greater than 5x3cm     Time  2    Period  Weeks    Status  Achieved      PT SHORT TERM GOAL #4   Title  Smaller of Lt arm wound to be healed     Time  2    Period  Weeks    Status  Achieved        PT Long Term Goals - 03/01/17 1647      PT LONG TERM GOAL #1   Title  Pt wound to be 100% granulated to reduce risk for cellulitis     Time  4    Period  Weeks    Status  On-going      PT LONG TERM GOAL #2   Title  Pt LT leg wound to  be decreased in size to no greater than 2 x 2 cm to allow daughter to feel comfortable is self care for discharge.     Time  4    Period  Weeks    Status  On-going      PT LONG TERM GOAL #3   Title  PT Pain level with Left leg to be no greater than a 3/10 to allow pt to be able to walk for 5 minutes without increased pain.     Time  4    Period  Days    Status  Achieved      PT LONG TERM GOAL #4   Title  PT Larger Lt arm wound to be healed     Time  4    Period  Weeks    Status  Achieved            Plan - 03/01/17 1646    Clinical Impression Statement  see above     Rehab Potential  Good    PT Frequency  2x / week    PT Duration  8 weeks additional 4 weeks     PT Treatment/Interventions  Other (comment);Compression bandaging debridement, cleansing, to include pulse lavage  and dressing change     PT Next Visit Plan  as above        Patient will benefit from skilled therapeutic intervention in order to improve the following deficits and impairments:  Pain, Other (comment)(non-healing wound )  Visit Diagnosis: Open wound of left forearm with complication,  sequela  Unspecified open wound, left lower leg, sequela     Problem List Patient Active Problem List   Diagnosis Date Noted  . Acute respiratory failure with hypoxia ([place]) 02/15/2017  . Leg wound, left 02/15/2017  . History of stroke 10/20/2016  . TIA (transient ischemic attack) 10/12/2016  . Acute urinary retention 10/12/2016  . Popliteal artery aneurysm ([place]) 02/05/2016  . Malnutrition of moderate degree 12/15/2015  . DVT of popliteal vein ([place]) 12/14/2015  . Aneurysm of left internal iliac artery ([place]) 12/14/2015  . Sepsis affecting skin ([place]) 12/13/2015  . Dementia 12/13/2015  . GERD (gastroesophageal reflux disease) 12/13/2015  . Severe protein-calorie malnutrition ([place]) 12/13/2015  . Cellulitis of right leg 11/24/2015  . Dyslipidemia 11/24/2015  . Erythema of lower extremity 11/24/2015  . Anemia 11/01/2015  . Esophageal reflux 08/17/2013  . COPD with acute exacerbation ([place]) 08/11/2013  . NSTEMI (non-ST elevated myocardial infarction) type 2 07/26/2013  . Dehydration 07/25/2013  . AKI (acute kidney injury) ([place]) 07/25/2013  . Colitis 07/08/2013  . Hydronephrosis, left 07/08/2013  . Microscopic colitis 06/27/2013  . Memory loss 04/25/2013  . Depression 04/25/2013  . SAH (subarachnoid hemorrhage) ([place]) 11/22/2012  . Seizure disorder secondary to prior [facility] 11/22/2012    Rayetta Humphrey, PT CLT [phone] 03/01/2017, 4:50 PM  [place] [address] [place], [place], [address] Phone: [phone]   Fax:  [phone]  Name: Bridget Ball MRN: [id number] Date of Birth: September 05, 1928

## 2017-03-01 NOTE — Therapy (Signed)
[place], [place], [address] Phone: [phone]   Fax:  [phone]  Occupational Therapy Treatment  Patient Details  Name: Bridget Ball MRN: [id number] Date of Birth: 1928/03/31 Referring Provider: Edwinna Areola hall   Encounter Date: 03/01/2017  OT End of Session - 03/01/17 1459    Visit Number  13    Number of Visits  12    Date for OT Re-Evaluation  03/05/17    Authorization Type  [employer] medicare    OT Start Time  1434    OT Stop Time  1515    OT Time Calculation (min)  41 min    Activity Tolerance  Patient tolerated treatment well    Behavior During Therapy  [facility] for tasks assessed/performed;Anxious       Past Medical History:  Diagnosis Date  . Anxiety   . Arthritis   . Attention to nephrostomy ([place])    PT HAS NEPHROSTOMY TUBE IN PLACE  . Bruises easily   . Cancer [facility])    Skin cancer  . Chronic diarrhea   . Colitis   . COPD (chronic obstructive pulmonary disease) ([place])   . Dementia   . Depression   . GERD (gastroesophageal reflux disease)   . Hiatal hernia   . Hx of bronchitis   . Hx of pulmonary edema JULY 2015  . Hx of pulmonary edema JULY 2015  . Hx of septic shock JULY 2015  . Hydronephrosis, left   . Hyperlipidemia   . Memory difficulties   . Microscopic colitis   . Myocardial infarction [facility])    unknown time   . Renal insufficiency   . Seizures ([place])    last seizure was 4-5 years ago with "brain bleed"  . Shortness of breath    with exertion  . Stroke [facility])    TIA four years ago / stroke NOV 2014    Past Surgical History:  Procedure Laterality Date  . APPENDECTOMY    . BREAST BIOPSY Right 02/02/2014   Procedure: RIGHT BREAST BIOPSY;  Surgeon: Jamesetta So, MD;  Location: [facility] ORS;  Service: General;  Laterality: Right;  . COLONOSCOPY N/A 05/24/2013   Procedure: COLONOSCOPY;  Surgeon: Rogene Houston, MD;  Location: [facility] ENDO SUITE;  Service: Endoscopy;  Laterality: N/A;  100  .  CYSTOSCOPY W/ URETERAL STENT PLACEMENT Left 08/06/2015   Procedure: CYSTOSCOPY WITH LEFT URETERAL STENT EXCHANGE - Sammie Bench;  Surgeon: Franchot Gallo, MD;  Location: [facility] ORS;  Service: Urology;  Laterality: Left;  . CYSTOSCOPY W/ URETERAL STENT PLACEMENT Left 07/07/2016   Procedure: CYSTOSCOPY, LEFT RETROGRADE PYELOGRAM WITH LEFT URETERAL STENT REPLACEMENT;  Surgeon: Franchot Gallo, MD;  Location: [facility] ORS;  Service: Urology;  Laterality: Left;  . CYSTOSCOPY WITH RETROGRADE PYELOGRAM, URETEROSCOPY AND STENT PLACEMENT Left 09/18/2013   Procedure: CYSTOSCOPY WITH RETROGRADE PYELOGRAM,  URETEROSCOPY AND  STENT PLACEMENT, removal of nephrostomy tube;  Surgeon: Jorja Loa, MD;  Location: [facility] ORS;  Service: Urology;  Laterality: Left;  . CYSTOSCOPY WITH STENT PLACEMENT Left 09/04/2014   Procedure: CYSTOSCOPY WITH LEFT J2 STENT EXCHANGE;  Surgeon: Franchot Gallo, MD;  Location: [facility] ORS;  Service: Urology;  Laterality: Left;  . ENDOVASCULAR REPAIR OF POPLITEAL ARTERY ANEURYSM  02/05/2016  . ESOPHAGOGASTRODUODENOSCOPY N/A 05/24/2013   Procedure: ESOPHAGOGASTRODUODENOSCOPY (EGD);  Surgeon: Rogene Houston, MD;  Location: [facility];  Service: Endoscopy;  Laterality: N/A;  . FALSE ANEURYSM REPAIR Right 02/05/2016   Procedure: REPAIR RIGHT POLITEAL  ANEURYSM;  Surgeon: Serafina Mitchell, MD;  Location: [place];  Service: Vascular;  Laterality: Right;  . KNEE ARTHROSCOPY Right   . SKIN CANCER EXCISION    . TONSILLECTOMY    . TUBAL LIGATION      There were no vitals filed for this visit.  Subjective Assessment - 03/01/17 1458    Subjective   S: My arms feel stiff.          [facility] OT Assessment - 03/01/17 1459      Assessment   Medical Diagnosis  Bilateral Shoulder pain      Precautions   Precautions  Fall             Wound Therapy    Wound Properties Date First Assessed: 01/26/17 Time First Assessed: 1610 Wound Type: Laceration Location: Leg Location Orientation: Left;Anterior Wound  Description (Comments): on  shin Present on Admission: Yes   Wound Properties Date First Assessed: 02/11/17 Time First Assessed: 1705 Wound Type: Other (Comment) Location: Elbow Location Orientation: Left Wound Description (Comments): tip of elbow  Present on Admission: No   Incision Properties Date First Assessed: 02/05/16 Time First Assessed: 0932 Location: Leg Location Orientation: Right      OT Treatments/Exercises (OP) - 03/01/17 1459      Exercises   Exercises  Shoulder      Shoulder Exercises: Supine   Protraction  AAROM;10 reps    Horizontal ABduction  AAROM;10 reps    Flexion  AAROM;10 reps      Shoulder Exercises: Seated   Protraction  AAROM;10 reps    Horizontal ABduction  AROM;10 reps    External Rotation  AROM;10 reps    Internal Rotation  AROM;10 reps    Flexion  AAROM;10 reps    Abduction  AROM;10 reps      Shoulder Exercises: ROM/Strengthening   Other ROM/Strengthening Exercises  Used weighted green ball to complete circles around waist (left and right) 5 each direction. Then holding a [place]o ball completed circles around neck (left and right) 5X each direction.                OT Short Term Goals - 01/27/17 1501      OT SHORT TERM GOAL #1   Title  Patient will be educated and independent with HEP in order to faciliate progress in therapy and allow her to use her BUE's with increased comfort during daily tasks.     Time  3    Period  Weeks    Status  On-going      OT SHORT TERM GOAL #2   Title  Patient will increase BUE P/ROM to [facility] to increase ability to complete dressing tasks with less difficulty.     Time  3    Period  Weeks    Status  On-going      OT SHORT TERM GOAL #3   Title  Patient will increase BUE strength to 4-/5 to increase ability to complete normal household tasks with greater strength and ease.     Time  3    Period  Weeks    Status  On-going      OT SHORT TERM GOAL #4   Title  Patient will report a decreased pain level of 5/10  in BUE's while completing daily tasks.     Time  3    Period  Weeks    Status  On-going        OT Long Term Goals - 01/27/17 1501  OT LONG TERM GOAL #1   Title  Patient will return to highest level of independence while completing all daily tasks using BUE's with reports of decreased pain of 3/10 or less.     Time  6    Period  Weeks    Status  On-going      OT LONG TERM GOAL #2   Title  Patient will increase A/ROM of RUE to [facility] to increase ability to return to using right arm as dominant extremity.     Time  6    Period  Weeks    Status  On-going      OT LONG TERM GOAL #3   Title  Patient will increase BUE strength to 4/10 overall in order to complete normal household tasks with less difficulty.     Time  6    Period  Weeks    Status  On-going      OT LONG TERM GOAL #4   Title  Patient will decrease fasical restrictions in min amount or less in order to increase functional mobility needed for reaching overhead.     Time  6    Period  Weeks    Status  On-going            Plan - 03/01/17 1459    Clinical Impression Statement  A: Patient's right UE is very functional at this point. She struggles with that left UE and is barely able to make it to 90 degrees of flexion and abduction. She was able to doff her jacket independently at Modified indepedent/increased time. Patient requried VC for form and technique.    Plan  P: D/C myofascial release and passive stretching. Reassessment.        Patient will benefit from skilled therapeutic intervention in order to improve the following deficits and impairments:  Pain, Decreased range of motion, Increased fascial restrictions, Impaired UE functional use, Decreased strength  Visit Diagnosis: Other symptoms and signs involving the musculoskeletal system  Stiffness of left shoulder, not elsewhere classified  Acute pain of left shoulder  Acute pain of right shoulder  Stiffness of right shoulder, not elsewhere  classified    Problem List Patient Active Problem List   Diagnosis Date Noted  . Acute respiratory failure with hypoxia ([place]) 02/15/2017  . Leg wound, left 02/15/2017  . History of stroke 10/20/2016  . TIA (transient ischemic attack) 10/12/2016  . Acute urinary retention 10/12/2016  . Popliteal artery aneurysm ([place]) 02/05/2016  . Malnutrition of moderate degree 12/15/2015  . DVT of popliteal vein ([place]) 12/14/2015  . Aneurysm of left internal iliac artery ([place]) 12/14/2015  . Sepsis affecting skin ([place]) 12/13/2015  . Dementia 12/13/2015  . GERD (gastroesophageal reflux disease) 12/13/2015  . Severe protein-calorie malnutrition ([place]) 12/13/2015  . Cellulitis of right leg 11/24/2015  . Dyslipidemia 11/24/2015  . Erythema of lower extremity 11/24/2015  . Anemia 11/01/2015  . Esophageal reflux 08/17/2013  . COPD with acute exacerbation ([place]) 08/11/2013  . NSTEMI (non-ST elevated myocardial infarction) type 2 07/26/2013  . Dehydration 07/25/2013  . AKI (acute kidney injury) ([place]) 07/25/2013  . Colitis 07/08/2013  . Hydronephrosis, left 07/08/2013  . Microscopic colitis 06/27/2013  . Memory loss 04/25/2013  . Depression 04/25/2013  . SAH (subarachnoid hemorrhage) ([place]) 11/22/2012  . Seizure disorder secondary to prior [facility] 11/22/2012   Ailene Ravel, OTR/L,CBIS  [phone]  03/01/2017, 3:25 PM  [place], [place], [address] Phone: [phone]  Fax:  [phone]  Name: Bridget Ball MRN: [id number] Date of Birth: 1928-06-17

## 2017-03-03 ENCOUNTER — Telehealth (HOSPITAL_COMMUNITY): Payer: Self-pay | Admitting: Internal Medicine

## 2017-03-03 ENCOUNTER — Other Ambulatory Visit (INDEPENDENT_AMBULATORY_CARE_PROVIDER_SITE_OTHER): Payer: Self-pay | Admitting: Internal Medicine

## 2017-03-03 DIAGNOSIS — Z8669 Personal history of other diseases of the nervous system and sense organs: Secondary | ICD-10-CM | POA: Diagnosis not present

## 2017-03-03 DIAGNOSIS — J441 Chronic obstructive pulmonary disease with (acute) exacerbation: Secondary | ICD-10-CM | POA: Diagnosis not present

## 2017-03-03 DIAGNOSIS — R05 Cough: Secondary | ICD-10-CM | POA: Diagnosis not present

## 2017-03-03 NOTE — Telephone Encounter (Signed)
03/03/17  daughter [name] called and left a message to cx - said her mom has bronchitis and taking her to the dr and will pick up appts starting next week

## 2017-03-04 ENCOUNTER — Other Ambulatory Visit: Payer: Self-pay | Admitting: Neurology

## 2017-03-04 ENCOUNTER — Ambulatory Visit (HOSPITAL_COMMUNITY): Payer: Medicare Other

## 2017-03-04 ENCOUNTER — Ambulatory Visit (HOSPITAL_COMMUNITY): Payer: Medicare Other | Admitting: Physical Therapy

## 2017-03-08 ENCOUNTER — Encounter (HOSPITAL_COMMUNITY): Payer: Self-pay | Admitting: Physical Therapy

## 2017-03-08 ENCOUNTER — Ambulatory Visit (HOSPITAL_COMMUNITY): Payer: Medicare Other | Attending: Internal Medicine | Admitting: Physical Therapy

## 2017-03-08 ENCOUNTER — Other Ambulatory Visit: Payer: Self-pay

## 2017-03-08 DIAGNOSIS — S51002S Unspecified open wound of left elbow, sequela: Secondary | ICD-10-CM

## 2017-03-08 DIAGNOSIS — M25611 Stiffness of right shoulder, not elsewhere classified: Secondary | ICD-10-CM | POA: Diagnosis not present

## 2017-03-08 DIAGNOSIS — R29898 Other symptoms and signs involving the musculoskeletal system: Secondary | ICD-10-CM | POA: Insufficient documentation

## 2017-03-08 DIAGNOSIS — S81802S Unspecified open wound, left lower leg, sequela: Secondary | ICD-10-CM | POA: Insufficient documentation

## 2017-03-08 DIAGNOSIS — M25612 Stiffness of left shoulder, not elsewhere classified: Secondary | ICD-10-CM | POA: Diagnosis not present

## 2017-03-08 DIAGNOSIS — M25511 Pain in right shoulder: Secondary | ICD-10-CM | POA: Diagnosis not present

## 2017-03-08 DIAGNOSIS — M25512 Pain in left shoulder: Secondary | ICD-10-CM | POA: Insufficient documentation

## 2017-03-08 NOTE — Therapy (Signed)
[place], [place], [address] Phone: [phone]   Fax:  [phone]  Wound Care Therapy  Patient Details  Name: Bridget Ball MRN: [id number] Date of Birth: 1928/06/04 Referring Provider: Allyn Kenner    Encounter Date: 03/08/2017  PT End of Session - 03/08/17 1558    Visit Number  10    Number of Visits  16    Date for PT Re-Evaluation  02/25/17    Authorization Type  [employer] medicare    Authorization - Visit Number  10    Authorization - Number of Visits  16    PT Start Time  1350    PT Stop Time  1430    PT Time Calculation (min)  40 min    Equipment Utilized During Treatment  Oxygen    Activity Tolerance  Patient tolerated treatment well    Behavior During Therapy  Anxious       Past Medical History:  Diagnosis Date  . Anxiety   . Arthritis   . Attention to nephrostomy ([place])    PT HAS NEPHROSTOMY TUBE IN PLACE  . Bruises easily   . Cancer [facility])    Skin cancer  . Chronic diarrhea   . Colitis   . COPD (chronic obstructive pulmonary disease) ([place])   . Dementia   . Depression   . GERD (gastroesophageal reflux disease)   . Hiatal hernia   . Hx of bronchitis   . Hx of pulmonary edema JULY 2015  . Hx of pulmonary edema JULY 2015  . Hx of septic shock JULY 2015  . Hydronephrosis, left   . Hyperlipidemia   . Memory difficulties   . Microscopic colitis   . Myocardial infarction [facility])    unknown time   . Renal insufficiency   . Seizures ([place])    last seizure was 4-5 years ago with "brain bleed"  . Shortness of breath    with exertion  . Stroke [facility])    TIA four years ago / stroke NOV 2014    Past Surgical History:  Procedure Laterality Date  . APPENDECTOMY    . BREAST BIOPSY Right 02/02/2014   Procedure: RIGHT BREAST BIOPSY;  Surgeon: Jamesetta So, MD;  Location: [facility] ORS;  Service: General;  Laterality: Right;  . COLONOSCOPY N/A 05/24/2013   Procedure: COLONOSCOPY;  Surgeon: Rogene Houston, MD;   Location: [facility] END[facility] SU[facility];  Service: Endoscopy;  Laterality: N/A;  100  . CYSTOSCOPY W/ URETERAL STENT PLACEMENT Left 08/06/2015   Procedure: CYSTOSCOPY WITH LEFT URETERAL STENT EXCHANGE - Sammie Bench;  Surgeon: Franchot Gallo, MD;  Location: [facility] ORS;  Service: Urology;  Laterality: Left;  . CYSTOSCOPY W/ URETERAL STENT PLACEMENT Left 07/07/2016   Procedure: CYSTOSCOPY, LEFT RETROGRADE PYELOGRAM WITH LEFT URETERAL STENT REPLACEMENT;  Surgeon: Franchot Gallo, MD;  Location: [facility];  Service: Urology;  Laterality: Left;  . CYSTOSCOPY WITH RETROGRADE PYELOGRAM, URETEROSCOPY AND STENT PLACEMENT Left 09/18/2013   Procedure: CYSTOSCOPY WITH RETROGRADE PYELOGRAM,  URETEROSCOPY AND  STENT PLACEMENT, removal of nephrostomy tube;  Surgeon: Jorja Loa, MD;  Location: [facility];  Service: Urology;  Laterality: Left;  . CYSTOSCOPY WITH STENT PLACEMENT Left 09/04/2014   Procedure: CYSTOSCOPY WITH LEFT J2 STENT EXCHANGE;  Surgeon: Franchot Gallo, MD;  Location: [facility];  Service: Urology;  Laterality: Left;  . ENDOVASCULAR REPAIR OF POPLITEAL ARTERY ANEURYSM  02/05/2016  . ESOPHAGOGASTRODUODENOSCOPY N/A 05/24/2013   Procedure: ESOPHAGOGASTRODUODENOSCOPY (EGD);  Surgeon: Rogene Houston,  MD;  Location[facility];  Service: Endoscopy;  Laterality: N/A;  . FALSE ANEURYSM REPAIR Right 02/05/2016   Procedure: REPAIR RIGHT POLITEAL  ANEURYSM;  Surgeon: Serafina Mitchell, MD;  Location: [place];  Service: Vascular;  Laterality: Right;  . KNEE ARTHROSCOPY Right   . SKIN CANCER EXCISION    . TONSILLECTOMY    . TUBAL LIGATION      There were no vitals filed for this visit.              Wound Therapy - 03/08/17 1552    Subjective  Pt states that her leg feels much better.     Patient and Family Stated Goals  less pain and for the wounds to be healed     Date of Onset  01/05/17    Prior Treatments  self care     Faces Pain Scale  No hurt Pain only upon debridement.     Evaluation and Treatment  Procedures Explained to Patient/Family  Yes    Evaluation and Treatment Procedures  agreed to    Wound Properties Date First Assessed: 01/26/17 Time First Assessed: 1527 Wound Type: Laceration Location: Leg Location Orientation: Left;Anterior Wound Description (Comments): on  shin Present on Admission: Yes   Dressing Type  Compression wrap    Dressing Changed  Changed    Dressing Status  Intact;Old drainage    Dressing Change Frequency  PRN    Site / Wound Assessment  Granulation tissue;Friable    % Wound base Red or Granulating  -- 98%    % Wound base Yellow/Fibrinous Exudate  -- 2%    % Wound base Black/Eschar  0%    Peri-wound Assessment  Intact    Wound Length (cm)  2.5 cm    Wound Width (cm)  1 cm    Wound Surface Area (cm^2)  2.5 cm^2    Drainage Amount  Scant    Drainage Description  Serous    Treatment  Cleansed;Other (Comment) compression bandaging     Wound Properties Date First Assessed: 02/11/17 Time First Assessed: 1705 Wound Type: Other (Comment) Location: Elbow Location Orientation: Left Wound Description (Comments): tip of elbow  Present on Admission: No   Dressing Type  None;Gauze (Comment)    Dressing Changed  Changed    Dressing Status  New drainage    Dressing Change Frequency  PRN    Site / Wound Assessment  Pale;Yellow    % Wound base Red or Granulating  50%    % Wound base Yellow/Fibrinous Exudate  50%    % Wound base Black/Eschar  0%    Peri-wound Assessment  Edema;Erythema (blanchable)    Drainage Amount  Minimal    Drainage Description  Serosanguineous    Treatment  Hydrotherapy (Pulse lavage)    Incision Properties Date First Assessed: 02/05/16 Time First Assessed: 0932 Location: Leg Location Orientation: Right   Pulsed lavage therapy - wound location  Lt elbow     Pulsed Lavage with Suction (psi)  7 psi    Pulsed Lavage with Suction - Normal Saline Used  1000 mL    Pulsed Lavage Tip  Tip with splash shield    Selective Debridement - Location  elbow and  LE    Selective Debridement - Tools Used  Forceps;Scalpel gauze    Selective Debridement - Tissue Removed  slough and biofilm    Wound Therapy - Clinical Statement  Lt elbow wound has tunneling superiorly as well as depth of .5 cm.  Due to small opening therapist opted to use Pulse Lavage to attempt a deeper cleaning.  LE wound continue to improve but had an increased amount of slough which was able to be debrided.  Pt will benefit from continued skilled Physical therapy especially for the elbow as pt has just recently been discharged with cellulitis from this wound.  The wound continues to have both tunneling and depth with red hallo of approximately 3cm diameter.      Wound Therapy - Functional Problem List  difficulty walking due to pain     Factors Delaying/Impairing Wound Healing  Altered sensation;Incontinence;Multiple medical problems;Polypharmacy;Vascular compromise    Hydrotherapy Plan  Debridement;Dressing change    Wound Therapy - Frequency  2X / week for an additional 4 weeks.;     Wound Therapy - Current Recommendations  OT currently seeing OT for UE strength    Wound Plan  Pt LT LE wound has decreased in size significantly; anticipate needing only one more treatment for leg wound.  Lt elbow wound does not appear to be closing in and will need continues treatment.  Measure Lt elbow wound; probable discharge of left leg wound.    Dressing   Lt LE :  xeroform  followed by profore., #6 netting    Dressing  Lt UE: medi honey gauze, medipore tape                 PT Short Term Goals - 03/01/17 1647      PT SHORT TERM GOAL #1   Title  PT wound to be 80% granulated to reduce risk of cellulitis     Baseline  -- LE but not elbow    Time  2    Period  Weeks    Status  Partially Met      PT SHORT TERM GOAL #2   Title  PT and daughter to be able to verbalize the risk of cellulitis; the signs and symptoms and the need to seek medical attention     Time  2    Period  Weeks     Status  Achieved      PT SHORT TERM GOAL #3   Title  Lt Leg wound size to be no greater than 5x3cm     Time  2    Period  Weeks    Status  Achieved      PT SHORT TERM GOAL #4   Title  Smaller of Lt arm wound to be healed     Time  2    Period  Weeks    Status  Achieved        PT Long Term Goals - 03/01/17 1647      PT LONG TERM GOAL #1   Title  Pt wound to be 100% granulated to reduce risk for cellulitis     Time  4    Period  Weeks    Status  On-going      PT LONG TERM GOAL #2   Title  Pt LT leg wound to be decreased in size to no greater than 2 x 2 cm to allow daughter to feel comfortable is self care for discharge.     Time  4    Period  Weeks    Status  On-going      PT LONG TERM GOAL #3   Title  PT Pain level with Left leg to be no greater than a 3/10 to allow pt to be able to  walk for 5 minutes without increased pain.     Time  4    Period  Days    Status  Achieved      PT LONG TERM GOAL #4   Title  PT Larger Lt arm wound to be healed     Time  4    Period  Weeks    Status  Achieved            Plan - 03/08/17 1558    Clinical Impression Statement  as above     Rehab Potential  Good    PT Frequency  2x / week    PT Duration  8 weeks additional 4 weeks     PT Treatment/Interventions  Other (comment);Compression bandaging debridement, cleansing, to include pulse lavage  and dressing change     PT Next Visit Plan  as above        Patient will benefit from skilled therapeutic intervention in order to improve the following deficits and impairments:  Pain, Other (comment)(non-healing wound )  Visit Diagnosis: Unspecified open wound, left lower leg, sequela  Open wound of left elbow with complication, sequela     Problem List Patient Active Problem List   Diagnosis Date Noted  . Acute respiratory failure with hypoxia ([place]) 02/15/2017  . Leg wound, left 02/15/2017  . History of stroke 10/20/2016  . TIA (transient ischemic attack) 10/12/2016  .  Acute urinary retention 10/12/2016  . Popliteal artery aneurysm ([place]) 02/05/2016  . Malnutrition of moderate degree 12/15/2015  . DVT of popliteal vein ([place]) 12/14/2015  . Aneurysm of left internal iliac artery ([place]) 12/14/2015  . Sepsis affecting skin ([place]) 12/13/2015  . Dementia 12/13/2015  . GERD (gastroesophageal reflux disease) 12/13/2015  . Severe protein-calorie malnutrition ([place]) 12/13/2015  . Cellulitis of right leg 11/24/2015  . Dyslipidemia 11/24/2015  . Erythema of lower extremity 11/24/2015  . Anemia 11/01/2015  . Esophageal reflux 08/17/2013  . COPD with acute exacerbation ([place]) 08/11/2013  . NSTEMI (non-ST elevated myocardial infarction) type 2 07/26/2013  . Dehydration 07/25/2013  . AKI (acute kidney injury) ([place]) 07/25/2013  . Colitis 07/08/2013  . Hydronephrosis, left 07/08/2013  . Microscopic colitis 06/27/2013  . Memory loss 04/25/2013  . Depression 04/25/2013  . SAH (subarachnoid hemorrhage) ([place]) 11/22/2012  . Seizure disorder secondary to prior [facility] 11/22/2012   Rayetta Humphrey, PT CLT [phone] 03/08/2017, 4:00 PM  [place] [address] [place], [place], [address] Phone: [phone]   Fax:  [phone]  Name: Bridget Ball MRN: [id number] Date of Birth: [birth year]

## 2017-03-10 ENCOUNTER — Other Ambulatory Visit: Payer: Self-pay

## 2017-03-10 ENCOUNTER — Encounter (HOSPITAL_COMMUNITY): Payer: Self-pay | Admitting: Physical Therapy

## 2017-03-10 ENCOUNTER — Ambulatory Visit (HOSPITAL_COMMUNITY): Payer: Medicare Other | Admitting: Physical Therapy

## 2017-03-10 DIAGNOSIS — R29898 Other symptoms and signs involving the musculoskeletal system: Secondary | ICD-10-CM | POA: Diagnosis not present

## 2017-03-10 DIAGNOSIS — M25612 Stiffness of left shoulder, not elsewhere classified: Secondary | ICD-10-CM | POA: Diagnosis not present

## 2017-03-10 DIAGNOSIS — S81802S Unspecified open wound, left lower leg, sequela: Secondary | ICD-10-CM | POA: Diagnosis not present

## 2017-03-10 DIAGNOSIS — M25512 Pain in left shoulder: Secondary | ICD-10-CM | POA: Diagnosis not present

## 2017-03-10 DIAGNOSIS — S51002S Unspecified open wound of left elbow, sequela: Secondary | ICD-10-CM | POA: Diagnosis not present

## 2017-03-10 DIAGNOSIS — M25611 Stiffness of right shoulder, not elsewhere classified: Secondary | ICD-10-CM | POA: Diagnosis not present

## 2017-03-10 DIAGNOSIS — M25511 Pain in right shoulder: Secondary | ICD-10-CM | POA: Diagnosis not present

## 2017-03-10 NOTE — Therapy (Signed)
[place], [place], [address] Phone: [phone]   Fax:  [phone]  Wound Care Therapy  Patient Details  Name: Bridget Ball MRN: [id number] Date of Birth: Jun 19, 1928 Referring Provider: Allyn Kenner    Encounter Date: 03/10/2017  PT End of Session - 03/10/17 1611    Visit Number  11    Number of Visits  16    Date for PT Re-Evaluation  03/29/17    Authorization Type  [employer] medicare    Authorization - Visit Number  11    Authorization - Number of Visits  16    PT Start Time  1520    PT Stop Time  1600    PT Time Calculation (min)  40 min    Equipment Utilized During Treatment  Oxygen    Activity Tolerance  Patient tolerated treatment well    Behavior During Therapy  Anxious       Past Medical History:  Diagnosis Date  . Anxiety   . Arthritis   . Attention to nephrostomy ([place])    PT HAS NEPHROSTOMY TUBE IN PLACE  . Bruises easily   . Cancer [facility])    Skin cancer  . Chronic diarrhea   . Colitis   . COPD (chronic obstructive pulmonary disease) ([place])   . Dementia   . Depression   . GERD (gastroesophageal reflux disease)   . Hiatal hernia   . Hx of bronchitis   . Hx of pulmonary edema JULY 2015  . Hx of pulmonary edema JULY 2015  . Hx of septic shock JULY 2015  . Hydronephrosis, left   . Hyperlipidemia   . Memory difficulties   . Microscopic colitis   . Myocardial infarction [facility])    unknown time   . Renal insufficiency   . Seizures ([place])    last seizure was 4-5 years ago with "brain bleed"  . Shortness of breath    with exertion  . Stroke [facility])    TIA four years ago / stroke NOV 2014    Past Surgical History:  Procedure Laterality Date  . APPENDECTOMY    . BREAST BIOPSY Right 02/02/2014   Procedure: RIGHT BREAST BIOPSY;  Surgeon: Jamesetta So, MD;  Location: [facility] ORS;  Service: General;  Laterality: Right;  . COLONOSCOPY N/A 05/24/2013   Procedure: COLONOSCOPY;  Surgeon: Rogene Houston, MD;   Location: [facility] ENDO SUITE;  Service: Endoscopy;  Laterality: N/A;  100  . CYSTOSCOPY W/ URETERAL STENT PLACEMENT Left 08/06/2015   Procedure: CYSTOSCOPY WITH LEFT URETERAL STENT EXCHANGE - Sammie Bench;  Surgeon: Franchot Gallo, MD;  Location: [facility] ORS;  Service: Urology;  Laterality: Left;  . CYSTOSCOPY W/ URETERAL STENT PLACEMENT Left 07/07/2016   Procedure: CYSTOSCOPY, LEFT RETROGRADE PYELOGRAM WITH LEFT URETERAL STENT REPLACEMENT;  Surgeon: Franchot Gallo, MD;  Location: [facility] ORS;  Service: Urology;  Laterality: Left;  . CYSTOSCOPY WITH RETROGRADE PYELOGRAM, URETEROSCOPY AND STENT PLACEMENT Left 09/18/2013   Procedure: CYSTOSCOPY WITH RETROGRADE PYELOGRAM,  URETEROSCOPY AND  STENT PLACEMENT, removal of nephrostomy tube;  Surgeon: Jorja Loa, MD;  Location: [facility] ORS;  Service: Urology;  Laterality: Left;  . CYSTOSCOPY WITH STENT PLACEMENT Left 09/04/2014   Procedure: CYSTOSCOPY WITH LEFT J2 STENT EXCHANGE;  Surgeon: Franchot Gallo, MD;  Location: [facility] ORS;  Service: Urology;  Laterality: Left;  . ENDOVASCULAR REPAIR OF POPLITEAL ARTERY ANEURYSM  02/05/2016  . ESOPHAGOGASTRODUODENOSCOPY N/A 05/24/2013   Procedure: ESOPHAGOGASTRODUODENOSCOPY (EGD);  Surgeon: Rogene Houston,  MD;  Location: [facility] END[facility];  Service: Endoscopy;  Laterality: N/A;  . FALSE ANEURYSM REPAIR Right 02/05/2016   Procedure: REPAIR RIGHT POLITEAL  ANEURYSM;  Surgeon: Serafina Mitchell, MD;  Location: [place];  Service: Vascular;  Laterality: Right;  . KNEE ARTHROSCOPY Right   . SKIN CANCER EXCISION    . TONSILLECTOMY    . TUBAL LIGATION      There were no vitals filed for this visit.              Wound Therapy - 03/10/17 1602    Subjective  Pt states has no complaints.    Patient and Family Stated Goals  less pain and for the wounds to be healed     Date of Onset  01/05/17    Prior Treatments  self care     Faces Pain Scale  No hurt Pain only upon debridement.     Evaluation and Treatment Procedures Explained  to Patient/Family  Yes    Evaluation and Treatment Procedures  agreed to    Wound Properties Date First Assessed: 01/26/17 Time First Assessed: 1527 Wound Type: Laceration Location: Leg Location Orientation: Left;Anterior Wound Description (Comments): on  shin Present on Admission: Yes Final Assessment Date: 03/10/17 Final Assessment Time: 1604   Dressing Type  --    Dressing Status  --    Dressing Change Frequency  --    Site / Wound Assessment  --    % Wound base Red or Granulating  100%    Wound Properties Date First Assessed: 02/11/17 Time First Assessed: 1705 Wound Type: Other (Comment) Location: Elbow Location Orientation: Left Wound Description (Comments): tip of elbow  Present on Admission: No   Dressing Type  None;Gauze (Comment)    Dressing Changed  Changed    Dressing Status  New drainage    Dressing Change Frequency  PRN    Site / Wound Assessment  Pale;Yellow    % Wound base Red or Granulating  50%    % Wound base Yellow/Fibrinous Exudate  50%    % Wound base Black/Eschar  0%    Peri-wound Assessment  Edema;Erythema (blanchable)    Drainage Amount  Minimal    Drainage Description  Serosanguineous    Treatment  Hydrotherapy (Pulse lavage)    Incision Properties Date First Assessed: 02/05/16 Time First Assessed: 0932 Location: Leg Location Orientation: - Final Assessment Date: 03/10/17 Final Assessment Time: 8185   Dressing  --    Dressing Change Frequency  --    Site / Wound Assessment  --    Pulsed lavage therapy - wound location  Lt elbow     Pulsed Lavage with Suction (psi)  7 psi    Pulsed Lavage with Suction - Normal Saline Used  1000 mL    Pulsed Lavage Tip  Tip with splash shield    Selective Debridement - Location  elbow and LE    Selective Debridement - Tools Used  Forceps;Scalpel gauze    Selective Debridement - Tissue Removed  slough and biofilm    Wound Therapy - Clinical Statement  Pt Lt leg wound is now healed and will no longer be treated.  Pt elbow wound  has small opening with significant depth.  Due to small opening therapist is unsure if even the pulse lavage is adequately cleaning the area.  Patient will be decreased to ones time a week for wound care to assess.      Wound Therapy - Functional Problem List  difficulty walking due to pain     Factors Delaying/Impairing Wound Healing  Altered sensation;Incontinence;Multiple medical problems;Polypharmacy;Vascular compromise    Hydrotherapy Plan  Debridement;Dressing change    Wound Therapy - Frequency  Other (comment) decrease to one time a week for elbow wound care    Wound Therapy - Current Recommendations  -- currently seeing OT for UE strength    Wound Plan  discontinue LT LE wound care. Continue with elbow wound care but decrease to one time a week.  Measure elbow wound.    Dressing   --    Dressing  Lt UE: medi honey gauze, medipore tape                 PT Short Term Goals - 03/10/17 1613      PT SHORT TERM GOAL #1   Title  PT wound to be 80% granulated to reduce risk of cellulitis     Baseline  -- LE but not elbow    Time  2    Period  Weeks    Status  Achieved      PT SHORT TERM GOAL #2   Title  PT and daughter to be able to verbalize the risk of cellulitis; the signs and symptoms and the need to seek medical attention     Time  2    Period  Weeks    Status  Achieved      PT SHORT TERM GOAL #3   Title  Lt Leg wound size to be no greater than 5x3cm     Time  2    Period  Weeks    Status  Achieved      PT SHORT TERM GOAL #4   Title  Smaller of Lt arm wound to be healed     Time  2    Period  Weeks    Status  Achieved        PT Long Term Goals - 03/10/17 1613      PT LONG TERM GOAL #1   Title  Pt wound to be 100% granulated to reduce risk for cellulitis     Time  4    Period  Weeks    Status  Achieved      PT LONG TERM GOAL #2   Title  Pt LT leg wound to be decreased in size to no greater than 2 x 2 cm to allow daughter to feel comfortable is self care  for discharge.     Time  4    Period  Weeks    Status  Achieved      PT LONG TERM GOAL #3   Title  PT Pain level with Left leg to be no greater than a 3/10 to allow pt to be able to walk for 5 minutes without increased pain.     Time  4    Period  Days    Status  Achieved      PT LONG TERM GOAL #4   Title  PT Larger Lt arm wound to be healed     Time  4    Period  Weeks    Status  Achieved      PT LONG TERM GOAL #5   Title  Lt elbow wound tunneling to be no greater than .2 cm     Time  4    Period  Weeks    Status  New 03/10/2017  Plan - 03/10/17 1612    Rehab Potential  Good    PT Frequency  1x / week    PT Duration  8 weeks additional 4 weeks     PT Treatment/Interventions  Other (comment);Compression bandaging debridement, cleansing, to include pulse lavage  and dressing change     PT Next Visit Plan  as above        Patient will benefit from skilled therapeutic intervention in order to improve the following deficits and impairments:  Pain, Other (comment)(non-healing wound )  Visit Diagnosis: Open wound of left elbow with complication, sequela     Problem List Patient Active Problem List   Diagnosis Date Noted  . Acute respiratory failure with hypoxia ([place]) 02/15/2017  . Leg wound, left 02/15/2017  . History of stroke 10/20/2016  . TIA (transient ischemic attack) 10/12/2016  . Acute urinary retention 10/12/2016  . Popliteal artery aneurysm ([place]) 02/05/2016  . Malnutrition of moderate degree 12/15/2015  . DVT of popliteal vein ([place]) 12/14/2015  . Aneurysm of left internal iliac artery ([place]) 12/14/2015  . Sepsis affecting skin ([place]) 12/13/2015  . Dementia 12/13/2015  . GERD (gastroesophageal reflux disease) 12/13/2015  . Severe protein-calorie malnutrition ([place]) 12/13/2015  . Cellulitis of right leg 11/24/2015  . Dyslipidemia 11/24/2015  . Erythema of lower extremity 11/24/2015  . Anemia 11/01/2015  . Esophageal reflux 08/17/2013  . COPD  with acute exacerbation ([place]) 08/11/2013  . NSTEMI (non-ST elevated myocardial infarction) type 2 07/26/2013  . Dehydration 07/25/2013  . AKI (acute kidney injury) ([place]) 07/25/2013  . Colitis 07/08/2013  . Hydronephrosis, left 07/08/2013  . Microscopic colitis 06/27/2013  . Memory loss 04/25/2013  . Depression 04/25/2013  . SAH (subarachnoid hemorrhage) ([place]) 11/22/2012  . Seizure disorder secondary to prior [facility] 11/22/2012   Rayetta Humphrey, PT CLT [phone] 03/10/2017, 4:19 PM  [place] [address] [place], [place], [address] Phone: [phone]   Fax:  [phone]  Name: HILDEGARDE DUNAWAY MRN: [id number] Date of Birth: [birth year]

## 2017-03-12 DIAGNOSIS — J449 Chronic obstructive pulmonary disease, unspecified: Secondary | ICD-10-CM | POA: Diagnosis not present

## 2017-03-17 ENCOUNTER — Encounter (HOSPITAL_COMMUNITY): Payer: Self-pay | Admitting: Occupational Therapy

## 2017-03-17 ENCOUNTER — Encounter (HOSPITAL_COMMUNITY): Payer: Self-pay | Admitting: Physical Therapy

## 2017-03-17 ENCOUNTER — Ambulatory Visit (HOSPITAL_COMMUNITY): Payer: Medicare Other | Admitting: Occupational Therapy

## 2017-03-17 ENCOUNTER — Other Ambulatory Visit: Payer: Self-pay

## 2017-03-17 ENCOUNTER — Ambulatory Visit (HOSPITAL_COMMUNITY): Payer: Medicare Other | Admitting: Physical Therapy

## 2017-03-17 DIAGNOSIS — M25512 Pain in left shoulder: Secondary | ICD-10-CM

## 2017-03-17 DIAGNOSIS — M25511 Pain in right shoulder: Secondary | ICD-10-CM

## 2017-03-17 DIAGNOSIS — M25612 Stiffness of left shoulder, not elsewhere classified: Secondary | ICD-10-CM

## 2017-03-17 DIAGNOSIS — S81802S Unspecified open wound, left lower leg, sequela: Secondary | ICD-10-CM | POA: Diagnosis not present

## 2017-03-17 DIAGNOSIS — R29898 Other symptoms and signs involving the musculoskeletal system: Secondary | ICD-10-CM

## 2017-03-17 DIAGNOSIS — M25611 Stiffness of right shoulder, not elsewhere classified: Secondary | ICD-10-CM | POA: Diagnosis not present

## 2017-03-17 DIAGNOSIS — S51002S Unspecified open wound of left elbow, sequela: Secondary | ICD-10-CM

## 2017-03-17 NOTE — Therapy (Signed)
[place], [place], [address] Phone: [phone]   Fax:  [phone]  Occupational Therapy Reassessment, Treatment (recertification)  Patient Details  Name: Bridget Ball MRN: [id number] Date of Birth: 09/19/1928 Referring Provider: Allyn Kenner    Encounter Date: 03/17/2017  OT End of Session - 03/17/17 1719    Visit Number  14    Number of Visits  18    Date for OT Re-Evaluation  04/02/17    Authorization Type  [employer] medicare    OT Start Time  2505 pt arrived late    OT Stop Time  1430    OT Time Calculation (min)  37 min    Activity Tolerance  Patient tolerated treatment well    Behavior During Therapy  [facility] for tasks assessed/performed;Anxious       Past Medical History:  Diagnosis Date  . Anxiety   . Arthritis   . Attention to nephrostomy ([place])    PT HAS NEPHROSTOMY TUBE IN PLACE  . Bruises easily   . Cancer [facility])    Skin cancer  . Chronic diarrhea   . Colitis   . COPD (chronic obstructive pulmonary disease) ([place])   . Dementia   . Depression   . GERD (gastroesophageal reflux disease)   . Hiatal hernia   . Hx of bronchitis   . Hx of pulmonary edema JULY 2015  . Hx of pulmonary edema JULY 2015  . Hx of septic shock JULY 2015  . Hydronephrosis, left   . Hyperlipidemia   . Memory difficulties   . Microscopic colitis   . Myocardial infarction [facility])    unknown time   . Renal insufficiency   . Seizures ([place])    last seizure was 4-5 years ago with "brain bleed"  . Shortness of breath    with exertion  . Stroke [facility])    TIA four years ago / stroke NOV 2014    Past Surgical History:  Procedure Laterality Date  . APPENDECTOMY    . BREAST BIOPSY Right 02/02/2014   Procedure: RIGHT BREAST BIOPSY;  Surgeon: Jamesetta So, MD;  Location: [facility] ORS;  Service: General;  Laterality: Right;  . COLONOSCOPY N/A 05/24/2013   Procedure: COLONOSCOPY;  Surgeon: Rogene Houston, MD;  Location: [facility];   Service: Endoscopy;  Laterality: N/A;  100  . CYSTOSCOPY W/ URETERAL STENT PLACEMENT Left 08/06/2015   Procedure: CYSTOSCOPY WITH LEFT URETERAL STENT EXCHANGE - Sammie Bench;  Surgeon: Franchot Gallo, MD;  Location: [facility];  Service: Urology;  Laterality: Left;  . CYSTOSCOPY W/ URETERAL STENT PLACEMENT Left 07/07/2016   Procedure: CYSTOSCOPY, LEFT RETROGRADE PYELOGRAM WITH LEFT URETERAL STENT REPLACEMENT;  Surgeon: Franchot Gallo, MD;  Location: [facility];  Service: Urology;  Laterality: Left;  . CYSTOSCOPY WITH RETROGRADE PYELOGRAM, URETEROSCOPY AND STENT PLACEMENT Left 09/18/2013   Procedure: CYSTOSCOPY WITH RETROGRADE PYELOGRAM,  URETEROSCOPY AND  STENT PLACEMENT, removal of nephrostomy tube;  Surgeon: Jorja Loa, MD;  Location: [facility];  Service: Urology;  Laterality: Left;  . CYSTOSCOPY WITH STENT PLACEMENT Left 09/04/2014   Procedure: CYSTOSCOPY WITH LEFT J2 STENT EXCHANGE;  Surgeon: Franchot Gallo, MD;  Location: [facility];  Service: Urology;  Laterality: Left;  . ENDOVASCULAR REPAIR OF POPLITEAL ARTERY ANEURYSM  02/05/2016  . ESOPHAGOGASTRODUODENOSCOPY N/A 05/24/2013   Procedure: ESOPHAGOGASTRODUODENOSCOPY (EGD);  Surgeon: Rogene Houston, MD;  Location: [facility];  Service: Endoscopy;  Laterality: N/A;  . FALSE ANEURYSM REPAIR Right 02/05/2016  Procedure: REPAIR RIGHT POLITEAL  ANEURYSM;  Surgeon: Serafina Mitchell, MD;  Location: [place];  Service: Vascular;  Laterality: Right;  . KNEE ARTHROSCOPY Right   . SKIN CANCER EXCISION    . TONSILLECTOMY    . TUBAL LIGATION      There were no vitals filed for this visit.  Subjective Assessment - 03/17/17 1357    Subjective   S: My arms are sore today.     Currently in Pain?  Yes    Pain Score  5     Pain Location  Shoulder    Pain Orientation  Right;Left    Pain Descriptors / Indicators  Sore    Pain Type  Chronic pain    Pain Radiating Towards  n/a    Pain Onset  More than a month ago    Pain Frequency  Occasional    Aggravating  Factors   movement and use    Pain Relieving Factors  rest    Effect of Pain on Daily Activities  mod effect, she will stop due to pain    Multiple Pain Sites  No         [facility] OT Assessment - 03/17/17 1356      Assessment   Medical Diagnosis  bilateral shoulder pain      Precautions   Precautions  Fall      Restrictions   Weight Bearing Restrictions  No      AROM   Overall AROM Comments  Assessed seated. IR/er adducted.    AROM Assessment Site  Shoulder    Right Shoulder Flexion  124 Degrees 75 previous    Right Shoulder ABduction  128 Degrees 102 previous    Right Shoulder Internal Rotation  90 Degrees same as previous    Right Shoulder External Rotation  25 Degrees same as previous    Left Shoulder Flexion  120 Degrees 135 previous    Left Shoulder ABduction  59 Degrees 110 previous    Left Shoulder Internal Rotation  90 Degrees same as previous    Left Shoulder External Rotation  79 Degrees 87 previous      PROM   Overall PROM Comments  Assessed supine. IR/er adducted.     PROM Assessment Site  Shoulder    Right Shoulder Flexion  153 Degrees 150 previous    Right Shoulder ABduction  180 Degrees 140 previous    Right Shoulder Internal Rotation  90 Degrees same as previous    Right Shoulder External Rotation  65 Degrees 73 previous    Left Shoulder Flexion  143 Degrees 165 previous    Left Shoulder ABduction  180 Degrees 150 previous    Left Shoulder Internal Rotation  90 Degrees same as previous    Left Shoulder External Rotation  90 Degrees 85 previous      Strength   Overall Strength Comments  Assessed seated. IR/er adducted.     Strength Assessment Site  Shoulder    Right/Left Shoulder  Right;Left    Right Shoulder Flexion  3-/5 same as previous    Right Shoulder ABduction  3-/5 same as previous    Right Shoulder Internal Rotation  3+/5 same as previous    Right Shoulder External Rotation  3/5 same as previous    Left Shoulder Flexion  3+/5 same as previous     Left Shoulder ABduction  3+/5 same as previous    Left Shoulder Internal Rotation  3+/5 same as previous  Left Shoulder External Rotation  3+/5 same as previous              OT Treatments/Exercises (OP) - 03/17/17 1407      Exercises   Exercises  Shoulder      Shoulder Exercises: Supine   Protraction  PROM;5 reps    Horizontal ABduction  PROM;5 reps    External Rotation  PROM;5 reps    Internal Rotation  PROM;5 reps    Flexion  PROM;5 reps    ABduction  PROM;5 reps      Shoulder Exercises: Seated   Protraction  AAROM;10 reps    Horizontal ABduction Limitations  unable to complete    External Rotation  AROM;10 reps    Internal Rotation  AROM;10 reps    Flexion  AAROM;5 reps    ABduction Limitations  unable to complete      Shoulder Exercises: Therapy Ball   Flexion  10 reps               OT Short Term Goals - 03/17/17 1720      OT SHORT TERM GOAL #1   Title  Patient will be educated and independent with HEP in order to faciliate progress in therapy and allow her to use her BUE's with increased comfort during daily tasks.     Time  3    Period  Weeks    Status  On-going      OT SHORT TERM GOAL #2   Title  Patient will increase BUE P/ROM to [facility] to increase ability to complete dressing tasks with less difficulty.     Time  3    Period  Weeks    Status  Achieved      OT SHORT TERM GOAL #3   Title  Patient will increase BUE strength to 4-/5 to increase ability to complete normal household tasks with greater strength and ease.     Time  3    Period  Weeks    Status  On-going      OT SHORT TERM GOAL #4   Title  Patient will report a decreased pain level of 5/10 in BUE's while completing daily tasks.     Time  3    Period  Weeks    Status  On-going        OT Long Term Goals - 01/27/17 1501      OT LONG TERM GOAL #1   Title  Patient will return to highest level of independence while completing all daily tasks using BUE's with reports of  decreased pain of 3/10 or less.     Time  6    Period  Weeks    Status  On-going      OT LONG TERM GOAL #2   Title  Patient will increase A/ROM of RUE to [facility] to increase ability to return to using right arm as dominant extremity.     Time  6    Period  Weeks    Status  On-going      OT LONG TERM GOAL #3   Title  Patient will increase BUE strength to 4/10 overall in order to complete normal household tasks with less difficulty.     Time  6    Period  Weeks    Status  On-going      OT LONG TERM GOAL #4   Title  Patient will decrease fasical restrictions in min amount or less in order to increase functional  mobility needed for reaching overhead.     Time  6    Period  Weeks    Status  On-going            Plan - 03/17/17 1719    Clinical Impression Statement  A: Reassessment completed today, pt has met 1 STG thus far. Discussed progress with pt and her daughter, pt continues to have pain in BUE, however LUE is more severe which is limiting functional use of the LUE moreso than the RUE. Daughter reports pt is not completing her HEP however continues to say she will and wants to be able to use her arms. Discussed progress and importance of coming to therapy and completing HEP to see functional results. Pt would like to try 2 more weeks of therapy and if no progress is made discharge at that time. During session today, pain limiting ability to complete exercises, multiple rest breaks required during session, consistent verbal cuing throughout exercises completed.     Rehab Potential  Fair    OT Frequency  2x / week    OT Duration  2 weeks    OT Treatment/Interventions  Self-care/ADL training;Ultrasound;Patient/family education;DME and/or AE instruction;Cryotherapy;Electrical Stimulation;Moist Heat;Neuromuscular education;Therapeutic activities;Manual Therapy;Therapeutic exercise;Passive range of motion    Plan  P: Continue with skilled therapy services for 2 additional weeks focusing  mainly on functional use of LUE. Next session: D/C passive stretching and myofascial release; functional reaching task with LUE    Consulted and Agree with Plan of Care  Patient;Family member/caregiver    Family Member Consulted  Daughter: Lattie [place]       Patient will benefit from skilled therapeutic intervention in order to improve the following deficits and impairments:  Pain, Decreased range of motion, Increased fascial restrictions, Impaired UE functional use, Decreased strength  Visit Diagnosis: Other symptoms and signs involving the musculoskeletal system  Stiffness of left shoulder, not elsewhere classified  Acute pain of left shoulder  Acute pain of right shoulder  Stiffness of right shoulder, not elsewhere classified    Problem List Patient Active Problem List   Diagnosis Date Noted  . Acute respiratory failure with hypoxia ([place]) 02/15/2017  . Leg wound, left 02/15/2017  . History of stroke 10/20/2016  . TIA (transient ischemic attack) 10/12/2016  . Acute urinary retention 10/12/2016  . Popliteal artery aneurysm ([place]) 02/05/2016  . Malnutrition of moderate degree 12/15/2015  . DVT of popliteal vein ([place]) 12/14/2015  . Aneurysm of left internal iliac artery ([place]) 12/14/2015  . Sepsis affecting skin ([place]) 12/13/2015  . Dementia 12/13/2015  . GERD (gastroesophageal reflux disease) 12/13/2015  . Severe protein-calorie malnutrition ([place]) 12/13/2015  . Cellulitis of right leg 11/24/2015  . Dyslipidemia 11/24/2015  . Erythema of lower extremity 11/24/2015  . Anemia 11/01/2015  . Esophageal reflux 08/17/2013  . COPD with acute exacerbation ([place]) 08/11/2013  . NSTEMI (non-ST elevated myocardial infarction) type 2 07/26/2013  . Dehydration 07/25/2013  . AKI (acute kidney injury) ([place]) 07/25/2013  . Colitis 07/08/2013  . Hydronephrosis, left 07/08/2013  . Microscopic colitis 06/27/2013  . Memory loss 04/25/2013  . Depression 04/25/2013  . SAH (subarachnoid hemorrhage)  ([place]) 11/22/2012  . Seizure disorder secondary to prior [facility] 11/22/2012   Guadelupe Sabin, OTR/L  [phone] 03/17/2017, 5:28 PM  [place], [place], [address] Phone: [phone]   Fax:  [phone]  Name: Bridget Ball MRN: [id number] Date of Birth: [birth year]

## 2017-03-17 NOTE — Therapy (Signed)
[place], [place], [address] Phone: [phone]   Fax:  [phone]  Wound Care Therapy  Patient Details  Name: Bridget Ball MRN: [id number] Date of Birth: 06-04-1928 Referring Provider: Allyn Kenner    Encounter Date: 03/17/2017  PT End of Session - 03/17/17 1511    Visit Number  12    Number of Visits  16    Date for PT Re-Evaluation  03/29/17    Authorization Type  [employer] medicare    Authorization - Visit Number  12    Authorization - Number of Visits  16    PT Start Time  1430    PT Stop Time  1500    PT Time Calculation (min)  30 min    Equipment Utilized During Treatment  Oxygen    Activity Tolerance  Patient tolerated treatment well    Behavior During Therapy  Anxious       Past Medical History:  Diagnosis Date  . Anxiety   . Arthritis   . Attention to nephrostomy ([place])    PT HAS NEPHROSTOMY TUBE IN PLACE  . Bruises easily   . Cancer [facility])    Skin cancer  . Chronic diarrhea   . Colitis   . COPD (chronic obstructive pulmonary disease) ([place])   . Dementia   . Depression   . GERD (gastroesophageal reflux disease)   . Hiatal hernia   . Hx of bronchitis   . Hx of pulmonary edema JULY 2015  . Hx of pulmonary edema JULY 2015  . Hx of septic shock JULY 2015  . Hydronephrosis, left   . Hyperlipidemia   . Memory difficulties   . Microscopic colitis   . Myocardial infarction [facility])    unknown time   . Renal insufficiency   . Seizures ([place])    last seizure was 4-5 years ago with "brain bleed"  . Shortness of breath    with exertion  . Stroke [facility])    TIA four years ago / stroke NOV 2014    Past Surgical History:  Procedure Laterality Date  . APPENDECTOMY    . BREAST BIOPSY Right 02/02/2014   Procedure: RIGHT BREAST BIOPSY;  Surgeon: Jamesetta So, MD;  Location: [facility] ORS;  Service: General;  Laterality: Right;  . COLONOSCOPY N/A 05/24/2013   Procedure: COLONOSCOPY;  Surgeon: Rogene Houston,  MD;  Location: [facility] ENDO SUITE;  Service: Endoscopy;  Laterality: N/A;  100  . CYSTOSCOPY W/ URETERAL STENT PLACEMENT Left 08/06/2015   Procedure: CYSTOSCOPY WITH LEFT URETERAL STENT EXCHANGE - Sammie Bench;  Surgeon: Franchot Gallo, MD;  Location: [facility] ORS;  Service: Urology;  Laterality: Left;  . CYSTOSCOPY W/ URETERAL STENT PLACEMENT Left 07/07/2016   Procedure: CYSTOSCOPY, LEFT RETROGRADE PYELOGRAM WITH LEFT URETERAL STENT REPLACEMENT;  Surgeon: Franchot Gallo, MD;  Location: [facility] OR[facility];  Service: Urology;  Laterality: Left;  . CYSTOSCOPY WITH RETROGRADE PYELOGRAM, URETEROSCOPY AND STENT PLACEMENT Left 09/18/2013   Procedure: CYSTOSCOPY WITH RETROGRADE PYELOGRAM,  URETEROSCOPY AND  STENT PLACEMENT, removal of nephrostomy tube;  Surgeon: Jorja Loa, MD;  Location: [facility];  Service: Urology;  Laterality: Left;  . CYSTOSCOPY WITH STENT PLACEMENT Left 09/04/2014   Procedure: CYSTOSCOPY WITH LEFT J2 STENT EXCHANGE;  Surgeon: Franchot Gallo, MD;  Location: [facility] OR[facility];  Service: Urology;  Laterality: Left;  . ENDOVASCULAR REPAIR OF POPLITEAL ARTERY ANEURYSM  02/05/2016  . ESOPHAGOGASTRODUODENOSCOPY N/A 05/24/2013   Procedure: ESOPHAGOGASTRODUODENOSCOPY (EGD);  Surgeon: Rogene Houston,  MD;  Location: [facility];  Service: Endoscopy;  Laterality: N/A;  . FALSE ANEURYSM REPAIR Right 02/05/2016   Procedure: REPAIR RIGHT POLITEAL  ANEURYSM;  Surgeon: Serafina Mitchell, MD;  Location: [place];  Service: Vascular;  Laterality: Right;  . KNEE ARTHROSCOPY Right   . SKIN CANCER EXCISION    . TONSILLECTOMY    . TUBAL LIGATION      There were no vitals filed for this visit.              Wound Therapy - 03/17/17 1504    Subjective  Pt states has no complaints other than her shoulders being sore from just seeing OT    Patient and Family Stated Goals wound to be healed     Date of Onset  01/05/17    Prior Treatments  self care     Pain Score  4     Faces Pain Scale  No hurt Pain only upon  debridement.     Pain Type  Chronic pain    Pain Location  Shoulder    Pain Orientation  Right;Left    Pain Descriptors / Indicators  Sore    Evaluation and Treatment Procedures Explained to Patient/Family  Yes    Evaluation and Treatment Procedures  agreed to    Wound Properties Date First Assessed: 02/11/17 Time First Assessed: 6644 Wound Type: Other (Comment) Location: Elbow Location Orientation: Left Wound Description (Comments): tip of elbow  Present on Admission: No   Dressing Type  None;Gauze (Comment)    Dressing Status  New drainage    Dressing Change Frequency  PRN    Site / Wound Assessment  Pale;Yellow    % Wound base Red or Granulating  70%    % Wound base Yellow/Fibrinous Exudate  30%    % Wound base Black/Eschar  0%    Peri-wound Assessment  Edema;Erythema (blanchable)    Wound Length (cm)  0.5 cm    Wound Width (cm)  0.5 cm    Wound Depth (cm)  0.2 cm    Wound Volume (cm^3)  0.05 cm^3    Wound Surface Area (cm^2)  0.25 cm^2    Tunneling (cm)  .2 to .3 medially,laterally and superiorly    Drainage Amount  Scant    Drainage Description  Serous    Treatment  Debridement (Selective);Hydrotherapy (Pulse lavage)    Pulsed lavage therapy - wound location  Lt elbow     Pulsed Lavage with Suction (psi)  5 psi    Pulsed Lavage with Suction - Normal Saline Used  1000 mL    Pulsed Lavage Tip  Tip with splash shield    Selective Debridement - Location  elbow and LE    Selective Debridement - Tools Used  Forceps;Scalpel gauze    Selective Debridement - Tissue Removed  slough and biofilm    Wound Therapy - Clinical Statement  Noted decreased depth and undermining with wound.  Improved granualtion and decreased rubor.  Pt will continue to be seen weekly until depth and undermining is gone.      Wound Therapy - Functional Problem List  difficulty walking due to pain     Factors Delaying/Impairing Wound Healing  Altered sensation;Incontinence;Multiple medical  problems;Polypharmacy;Vascular compromise    Hydrotherapy Plan  Debridement;Dressing change    Wound Therapy - Frequency  Other (comment) decrease to one time a week for elbow wound care    Wound Therapy - Current Recommendations  -- currently seeing OT for UE  strength    Wound Plan  May use Pulse lavage as needed at this time.     Dressing   --    Dressing  Lt UE: medi honey gauze, kling and netting                PT Short Term Goals - 03/10/17 1613      PT SHORT TERM GOAL #1   Title  PT wound to be 80% granulated to reduce risk of cellulitis     Baseline  -- LE but not elbow    Time  2    Period  Weeks    Status  Achieved      PT SHORT TERM GOAL #2   Title  PT and daughter to be able to verbalize the risk of cellulitis; the signs and symptoms and the need to seek medical attention     Time  2    Period  Weeks    Status  Achieved      PT SHORT TERM GOAL #3   Title  Lt Leg wound size to be no greater than 5x3cm     Time  2    Period  Weeks    Status  Achieved      PT SHORT TERM GOAL #4   Title  Smaller of Lt arm wound to be healed     Time  2    Period  Weeks    Status  Achieved        PT Long Term Goals - 03/10/17 1613      PT LONG TERM GOAL #1   Title  Pt wound to be 100% granulated to reduce risk for cellulitis     Time  4    Period  Weeks    Status  Achieved      PT LONG TERM GOAL #2   Title  Pt LT leg wound to be decreased in size to no greater than 2 x 2 cm to allow daughter to feel comfortable is self care for discharge.     Time  4    Period  Weeks    Status  Achieved      PT LONG TERM GOAL #3   Title  PT Pain level with Left leg to be no greater than a 3/10 to allow pt to be able to walk for 5 minutes without increased pain.     Time  4    Period  Days    Status  Achieved      PT LONG TERM GOAL #4   Title  PT Larger Lt arm wound to be healed     Time  4    Period  Weeks    Status  Achieved      PT LONG TERM GOAL #5   Title  Lt elbow  wound tunneling to be no greater than .2 cm     Time  4    Period  Weeks    Status  New 03/10/2017            Plan - 03/17/17 1511    Clinical Impression Statement  as above     Rehab Potential  Good    PT Frequency  1x / week    PT Duration  8 weeks additional 4 weeks     PT Treatment/Interventions  Other (comment);Compression bandaging debridement, cleansing, to include pulse lavage  and dressing change     PT Next Visit Plan  as above     Consulted and Agree with Plan of Care  Patient       Patient will benefit from skilled therapeutic intervention in order to improve the following deficits and impairments:  Pain, Other (comment)(non-healing wound )  Visit Diagnosis: Open wound of left elbow with complication, sequela     Problem List Patient Active Problem List   Diagnosis Date Noted  . Acute respiratory failure with hypoxia ([place]) 02/15/2017  . Leg wound, left 02/15/2017  . History of stroke 10/20/2016  . TIA (transient ischemic attack) 10/12/2016  . Acute urinary retention 10/12/2016  . Popliteal artery aneurysm ([place]) 02/05/2016  . Malnutrition of moderate degree 12/15/2015  . DVT of popliteal vein ([place]) 12/14/2015  . Aneurysm of left internal iliac artery ([place]) 12/14/2015  . Sepsis affecting skin ([place]) 12/13/2015  . Dementia 12/13/2015  . GERD (gastroesophageal reflux disease) 12/13/2015  . Severe protein-calorie malnutrition ([place]) 12/13/2015  . Cellulitis of right leg 11/24/2015  . Dyslipidemia 11/24/2015  . Erythema of lower extremity 11/24/2015  . Anemia 11/01/2015  . Esophageal reflux 08/17/2013  . COPD with acute exacerbation ([place]) 08/11/2013  . NSTEMI (non-ST elevated myocardial infarction) type 2 07/26/2013  . Dehydration 07/25/2013  . AKI (acute kidney injury) ([place]) 07/25/2013  . Colitis 07/08/2013  . Hydronephrosis, left 07/08/2013  . Microscopic colitis 06/27/2013  . Memory loss 04/25/2013  . Depression 04/25/2013  . SAH (subarachnoid  hemorrhage) ([place]) 11/22/2012  . Seizure disorder secondary to prior [facility] 11/22/2012    Rayetta Humphrey, PT CLT [phone] 03/17/2017, 3:12 PM  [place] [address] [place], [place], [address] Phone: [phone]   Fax:  [phone]  Name: SHAUNTEL PREST MRN: [id number] Date of Birth: 05/31/1928

## 2017-03-19 DIAGNOSIS — J449 Chronic obstructive pulmonary disease, unspecified: Secondary | ICD-10-CM | POA: Diagnosis not present

## 2017-03-19 DIAGNOSIS — R269 Unspecified abnormalities of gait and mobility: Secondary | ICD-10-CM | POA: Diagnosis not present

## 2017-03-22 ENCOUNTER — Ambulatory Visit (HOSPITAL_COMMUNITY): Payer: Medicare Other | Admitting: Physical Therapy

## 2017-03-22 ENCOUNTER — Telehealth (HOSPITAL_COMMUNITY): Payer: Self-pay

## 2017-03-22 ENCOUNTER — Ambulatory Visit (HOSPITAL_COMMUNITY): Payer: Medicare Other

## 2017-03-22 NOTE — Telephone Encounter (Signed)
Patient daughter had to cancel todays appt she is not feeling well ans wont be able to bring her mom

## 2017-03-23 ENCOUNTER — Encounter (HOSPITAL_COMMUNITY): Payer: Medicare Other

## 2017-03-23 DIAGNOSIS — J441 Chronic obstructive pulmonary disease with (acute) exacerbation: Secondary | ICD-10-CM | POA: Diagnosis not present

## 2017-03-23 DIAGNOSIS — L03113 Cellulitis of right upper limb: Secondary | ICD-10-CM | POA: Diagnosis not present

## 2017-03-23 DIAGNOSIS — I8312 Varicose veins of left lower extremity with inflammation: Secondary | ICD-10-CM | POA: Diagnosis not present

## 2017-03-23 DIAGNOSIS — J9600 Acute respiratory failure, unspecified whether with hypoxia or hypercapnia: Secondary | ICD-10-CM | POA: Diagnosis not present

## 2017-03-23 DIAGNOSIS — R300 Dysuria: Secondary | ICD-10-CM | POA: Diagnosis not present

## 2017-03-24 ENCOUNTER — Telehealth (HOSPITAL_COMMUNITY): Payer: Self-pay | Admitting: Physical Therapy

## 2017-03-24 ENCOUNTER — Encounter (HOSPITAL_COMMUNITY): Payer: Self-pay | Admitting: Physical Therapy

## 2017-03-24 ENCOUNTER — Ambulatory Visit (HOSPITAL_COMMUNITY): Payer: Medicare Other | Admitting: Physical Therapy

## 2017-03-24 DIAGNOSIS — R29898 Other symptoms and signs involving the musculoskeletal system: Secondary | ICD-10-CM | POA: Diagnosis not present

## 2017-03-24 DIAGNOSIS — S81802S Unspecified open wound, left lower leg, sequela: Secondary | ICD-10-CM | POA: Diagnosis not present

## 2017-03-24 DIAGNOSIS — M25512 Pain in left shoulder: Secondary | ICD-10-CM | POA: Diagnosis not present

## 2017-03-24 DIAGNOSIS — S51002S Unspecified open wound of left elbow, sequela: Secondary | ICD-10-CM

## 2017-03-24 DIAGNOSIS — M25611 Stiffness of right shoulder, not elsewhere classified: Secondary | ICD-10-CM | POA: Diagnosis not present

## 2017-03-24 DIAGNOSIS — M25511 Pain in right shoulder: Secondary | ICD-10-CM | POA: Diagnosis not present

## 2017-03-24 DIAGNOSIS — M25612 Stiffness of left shoulder, not elsewhere classified: Secondary | ICD-10-CM | POA: Diagnosis not present

## 2017-03-24 NOTE — Telephone Encounter (Signed)
Called PT to see if they could come in at 1:45 so that therapist could assist with care at the hospital.  PT agreeable.  Rayetta Humphrey, [place] CLT [phone]

## 2017-03-24 NOTE — Therapy (Signed)
[place] [address] [place], [place], [address] Phone: [phone]   Fax:  [phone]  Physical Therapy Treatment/Discharge   Patient Details  Name: Bridget Ball MRN: [id number] Date of Birth: Jun 22, 1928 Referring Provider: Allyn Kenner    Encounter Date: 03/24/2017  PT End of Session - 03/24/17 1413    Visit Number  13    Number of Visits  13    Date for PT Re-Evaluation  03/29/17    Authorization Type  [employer] medicare    Authorization - Visit Number  13    Authorization - Number of Visits  13    PT Start Time  1400    PT Stop Time  1413    PT Time Calculation (min)  13 min    Activity Tolerance  Patient tolerated treatment well    Behavior During Therapy  Anxious       Past Medical History:  Diagnosis Date  . Anxiety   . Arthritis   . Attention to nephrostomy ([place])    PT HAS NEPHROSTOMY TUBE IN PLACE  . Bruises easily   . Cancer [facility])    Skin cancer  . Chronic diarrhea   . Colitis   . COPD (chronic obstructive pulmonary disease) ([place])   . Dementia   . Depression   . GERD (gastroesophageal reflux disease)   . Hiatal hernia   . Hx of bronchitis   . Hx of pulmonary edema JULY 2015  . Hx of pulmonary edema JULY 2015  . Hx of septic shock JULY 2015  . Hydronephrosis, left   . Hyperlipidemia   . Memory difficulties   . Microscopic colitis   . Myocardial infarction [facility])    unknown time   . Renal insufficiency   . Seizures ([place])    last seizure was 4-5 years ago with "brain bleed"  . Shortness of breath    with exertion  . Stroke [facility])    TIA four years ago / stroke NOV 2014    Past Surgical History:  Procedure Laterality Date  . APPENDECTOMY    . BREAST BIOPSY Right 02/02/2014   Procedure: RIGHT BREAST BIOPSY;  Surgeon: Jamesetta So, MD;  Location: [facility] ORS;  Service: General;  Laterality: Right;  . COLONOSCOPY N/A 05/24/2013   Procedure: COLONOSCOPY;  Surgeon: Rogene Houston, MD;  Location: [facility];   Service: Endoscopy;  Laterality: N/A;  100  . CYSTOSCOPY W/ URETERAL STENT PLACEMENT Left 08/06/2015   Procedure: CYSTOSCOPY WITH LEFT URETERAL STENT EXCHANGE - Sammie Bench;  Surgeon: Franchot Gallo, MD;  Location: [facility];  Service: Urology;  Laterality: Left;  . CYSTOSCOPY W/ URETERAL STENT PLACEMENT Left 07/07/2016   Procedure: CYSTOSCOPY, LEFT RETROGRADE PYELOGRAM WITH LEFT URETERAL STENT REPLACEMENT;  Surgeon: Franchot Gallo, MD;  Location: [facility];  Service: Urology;  Laterality: Left;  . CYSTOSCOPY WITH RETROGRADE PYELOGRAM, URETEROSCOPY AND STENT PLACEMENT Left 09/18/2013   Procedure: CYSTOSCOPY WITH RETROGRADE PYELOGRAM,  URETEROSCOPY AND  STENT PLACEMENT, removal of nephrostomy tube;  Surgeon: Jorja Loa, MD;  Location[facility];  Service: Urology;  Laterality: Left;  . CYSTOSCOPY WITH STENT PLACEMENT Left 09/04/2014   Procedure: CYSTOSCOPY WITH LEFT J2 STENT EXCHANGE;  Surgeon: Franchot Gallo, MD;  Location: [facility];  Service: Urology;  Laterality: Left;  . ENDOVASCULAR REPAIR OF POPLITEAL ARTERY ANEURYSM  02/05/2016  . ESOPHAGOGASTRODUODENOSCOPY N/A 05/24/2013   Procedure: ESOPHAGOGASTRODUODENOSCOPY (EGD);  Surgeon: Rogene Houston, MD;  Location[facility];  Service:  Endoscopy;  Laterality: N/A;  . FALSE ANEURYSM REPAIR Right 02/05/2016   Procedure: REPAIR RIGHT POLITEAL  ANEURYSM;  Surgeon: Serafina Mitchell, MD;  Location: [place];  Service: Vascular;  Laterality: Right;  . KNEE ARTHROSCOPY Right   . SKIN CANCER EXCISION    . TONSILLECTOMY    . TUBAL LIGATION      There were no vitals filed for this visit.                 Wound Therapy - 03/24/17 1354    Subjective  PT is not having any pain in her elbow.      Patient and Family Stated Goals  less pain and for the wounds to be healed     Date of Onset  01/05/17    Prior Treatments  self care     Pain Score  0-No pain    Faces Pain Scale  No hurt Pain only upon debridement.     Evaluation and Treatment  Procedures Explained to Patient/Family  Yes    Evaluation and Treatment Procedures  agreed to    Wound Properties Date First Assessed: 02/11/17 Time First Assessed: 2694 Wound Type: Other (Comment) Location: Elbow Location Orientation: Left Wound Description (Comments): tip of elbow  Present on Admission: No   Dressing Type  None;Gauze (Comment)    Dressing Status  New drainage    Dressing Change Frequency  PRN    Site / Wound Assessment  Pale;Yellow    % Wound base Red or Granulating  100%    % Wound base Yellow/Fibrinous Exudate  0%    % Wound base Black/Eschar  0%    Peri-wound Assessment  Edema;Erythema (blanchable)    Wound Length (cm)  0.2 cm    Wound Width (cm)  0.2 cm    Wound Depth (cm)  0 cm    Wound Volume (cm^3)  0 cm^3    Wound Surface Area (cm^2)  0.04 cm^2    Drainage Amount  None    Wound Therapy - Clinical Statement  Wound no longer has any depth.  Wound size has decreased to .2 cm .  Therapist spoke to pt daugher re self care of gentle washing , moisturizing UE and keeping a bandaid on Lt elbow until wound is totally healed.  Daughter verbalized understanding.  Pt is ready for discharte.     Wound Therapy - Functional Problem List  difficulty walking due to pain     Factors Delaying/Impairing Wound Healing  Altered sensation;Incontinence;Multiple medical problems;Polypharmacy;Vascular compromise    Hydrotherapy Plan  -- Discharge pt to family care.     Wound Therapy - Frequency  Other (comment) decrease to one time a week for elbow wound care    Wound Therapy - Current Recommendations  -- currently seeing OT for UE strength    Wound Plan  May use Pulse lavage as needed at this time.     Dressing  bandaid to Lt elbow                PT Education - 03/24/17 1412    Education provided  Yes    Education Details  self care for skin and wound     Person(s) Educated  Child(ren)    Methods  Explanation    Comprehension  Verbalized understanding       PT Short  Term Goals - 03/24/17 1414      PT SHORT TERM GOAL #1   Title  PT wound to  be 80% granulated to reduce risk of cellulitis     Baseline  -- LE but not elbow    Time  2    Period  Weeks    Status  Achieved      PT SHORT TERM GOAL #2   Title  PT and daughter to be able to verbalize the risk of cellulitis; the signs and symptoms and the need to seek medical attention     Time  2    Period  Weeks    Status  Achieved      PT SHORT TERM GOAL #3   Title  Lt Leg wound size to be no greater than 5x3cm     Time  2    Period  Weeks    Status  Achieved      PT SHORT TERM GOAL #4   Title  Smaller of Lt arm wound to be healed     Time  2    Period  Weeks    Status  Achieved        PT Long Term Goals - 03/24/17 1414      PT LONG TERM GOAL #1   Title  Pt wound to be 100% granulated to reduce risk for cellulitis     Time  4    Period  Weeks    Status  Achieved      PT LONG TERM GOAL #2   Title  Pt LT leg wound to be decreased in size to no greater than 2 x 2 cm to allow daughter to feel comfortable is self care for discharge.     Time  4    Period  Weeks    Status  Achieved      PT LONG TERM GOAL #3   Title  PT Pain level with Left leg to be no greater than a 3/10 to allow pt to be able to walk for 5 minutes without increased pain.     Time  4    Period  Days    Status  Achieved      PT LONG TERM GOAL #4   Title  PT Larger Lt arm wound to be healed     Time  4    Period  Weeks    Status  Achieved      PT LONG TERM GOAL #5   Title  Lt elbow wound tunneling to be no greater than .2 cm     Time  4    Period  Weeks    Status  Achieved 03/10/2017            Plan - 03/24/17 1413    Clinical Impression Statement  as above     Rehab Potential  Good    PT Frequency  1x / week    PT Duration  8 weeks additional 4 weeks     PT Treatment/Interventions  Other (comment);Compression bandaging debridement, cleansing, to include pulse lavage  and dressing change     PT Next  Visit Plan  discharge pt     Consulted and Agree with Plan of Care  Patient       Patient will benefit from skilled therapeutic intervention in order to improve the following deficits and impairments:  Pain, Other (comment)(non-healing wound )  Visit Diagnosis: Open wound of left elbow with complication, sequela     Problem List Patient Active Problem List   Diagnosis Date Noted  . Acute respiratory failure with hypoxia (  [place]) 02/15/2017  . Leg wound, left 02/15/2017  . History of stroke 10/20/2016  . TIA (transient ischemic attack) 10/12/2016  . Acute urinary retention 10/12/2016  . Popliteal artery aneurysm ([place]) 02/05/2016  . Malnutrition of moderate degree 12/15/2015  . DVT of popliteal vein ([place]) 12/14/2015  . Aneurysm of left internal iliac artery ([place]) 12/14/2015  . Sepsis affecting skin ([place]) 12/13/2015  . Dementia 12/13/2015  . GERD (gastroesophageal reflux disease) 12/13/2015  . Severe protein-calorie malnutrition ([place]) 12/13/2015  . Cellulitis of right leg 11/24/2015  . Dyslipidemia 11/24/2015  . Erythema of lower extremity 11/24/2015  . Anemia 11/01/2015  . Esophageal reflux 08/17/2013  . COPD with acute exacerbation ([place]) 08/11/2013  . NSTEMI (non-ST elevated myocardial infarction) type 2 07/26/2013  . Dehydration 07/25/2013  . AKI (acute kidney injury) ([place]) 07/25/2013  . Colitis 07/08/2013  . Hydronephrosis, left 07/08/2013  . Microscopic colitis 06/27/2013  . Memory loss 04/25/2013  . Depression 04/25/2013  . SAH (subarachnoid hemorrhage) ([place]) 11/22/2012  . Seizure disorder secondary to prior [facility] 11/22/2012    Rayetta Humphrey, PT CLT [phone] 03/24/2017, 2:15 PM  [place], [place], [address] Phone: [phone]   Fax:  [phone]  Name: Bridget Ball MRN: [id number] Date of Birth: Oct 27, 1928  PHYSICAL THERAPY DISCHARGE SUMMARY  Visits from Start of Care: 13  Current  functional level related to goals / functional outcomes: See above    Remaining deficit:  .2cm diameter wound no depth; 100% granulated    Education / Equipment: Moisturize and cleanse gently  Plan: Patient agrees to discharge.  Patient goals were met. Patient is being discharged due to meeting the stated rehab goals.  ?????       Rayetta Humphrey, [place] CLT [phone]

## 2017-03-26 ENCOUNTER — Telehealth (HOSPITAL_COMMUNITY): Payer: Self-pay | Admitting: Occupational Therapy

## 2017-03-26 ENCOUNTER — Ambulatory Visit (HOSPITAL_COMMUNITY): Payer: Medicare Other | Admitting: Occupational Therapy

## 2017-03-26 NOTE — Telephone Encounter (Signed)
Bridget Ball daughter is not feeling well and won't be able to bring her today

## 2017-03-29 ENCOUNTER — Ambulatory Visit (HOSPITAL_COMMUNITY): Payer: Medicare Other | Admitting: Physical Therapy

## 2017-03-30 ENCOUNTER — Other Ambulatory Visit: Payer: Self-pay

## 2017-03-30 ENCOUNTER — Ambulatory Visit (HOSPITAL_COMMUNITY): Payer: Medicare Other

## 2017-03-30 ENCOUNTER — Encounter (HOSPITAL_COMMUNITY): Payer: Self-pay

## 2017-03-30 DIAGNOSIS — S81802S Unspecified open wound, left lower leg, sequela: Secondary | ICD-10-CM | POA: Diagnosis not present

## 2017-03-30 DIAGNOSIS — M25612 Stiffness of left shoulder, not elsewhere classified: Secondary | ICD-10-CM | POA: Diagnosis not present

## 2017-03-30 DIAGNOSIS — M25512 Pain in left shoulder: Secondary | ICD-10-CM | POA: Diagnosis not present

## 2017-03-30 DIAGNOSIS — M25511 Pain in right shoulder: Secondary | ICD-10-CM

## 2017-03-30 DIAGNOSIS — R29898 Other symptoms and signs involving the musculoskeletal system: Secondary | ICD-10-CM

## 2017-03-30 DIAGNOSIS — S51002S Unspecified open wound of left elbow, sequela: Secondary | ICD-10-CM | POA: Diagnosis not present

## 2017-03-30 DIAGNOSIS — M25611 Stiffness of right shoulder, not elsewhere classified: Secondary | ICD-10-CM | POA: Diagnosis not present

## 2017-03-30 NOTE — Therapy (Signed)
[place], [place], [address] Phone: [phone]   Fax:  [phone]  Occupational Therapy Treatment  Patient Details  Name: Bridget Ball MRN: [id number] Date of Birth: 1928-09-06 Referring Provider: Allyn Kenner    Encounter Date: 03/30/2017  OT End of Session - 03/30/17 1742    Visit Number  15    Number of Visits  18    Date for OT Re-Evaluation  04/02/17    Authorization Type  [employer] medicare    OT Start Time  1700 Pt arrived late    OT Stop Time  1730    OT Time Calculation (min)  30 min    Activity Tolerance  Patient limited by pain    Behavior During Therapy  [facility] for tasks assessed/performed       Past Medical History:  Diagnosis Date  . Anxiety   . Arthritis   . Attention to nephrostomy ([place])    PT HAS NEPHROSTOMY TUBE IN PLACE  . Bruises easily   . Cancer [facility])    Skin cancer  . Chronic diarrhea   . Colitis   . COPD (chronic obstructive pulmonary disease) ([place])   . Dementia   . Depression   . GERD (gastroesophageal reflux disease)   . Hiatal hernia   . Hx of bronchitis   . Hx of pulmonary edema JULY 2015  . Hx of pulmonary edema JULY 2015  . Hx of septic shock JULY 2015  . Hydronephrosis, left   . Hyperlipidemia   . Memory difficulties   . Microscopic colitis   . Myocardial infarction [facility])    unknown time   . Renal insufficiency   . Seizures ([place])    last seizure was 4-5 years ago with "brain bleed"  . Shortness of breath    with exertion  . Stroke [facility])    TIA four years ago / stroke NOV 2014    Past Surgical History:  Procedure Laterality Date  . APPENDECTOMY    . BREAST BIOPSY Right 02/02/2014   Procedure: RIGHT BREAST BIOPSY;  Surgeon: Jamesetta So, MD;  Location: [facility] ORS;  Service: General;  Laterality: Right;  . COLONOSCOPY N/A 05/24/2013   Procedure: COLONOSCOPY;  Surgeon: Rogene Houston, MD;  Location: [facility] ENDO SUITE;  Service: Endoscopy;  Laterality: N/A;  100  .  CYSTOSCOPY W/ URETERAL STENT PLACEMENT Left 08/06/2015   Procedure: CYSTOSCOPY WITH LEFT URETERAL STENT EXCHANGE - Sammie Bench;  Surgeon: Franchot Gallo, MD;  Location: [facility] ORS;  Service: Urology;  Laterality: Left;  . CYSTOSCOPY W/ URETERAL STENT PLACEMENT Left 07/07/2016   Procedure: CYSTOSCOPY, LEFT RETROGRADE PYELOGRAM WITH LEFT URETERAL STENT REPLACEMENT;  Surgeon: Franchot Gallo, MD;  Location: [facility] ORS;  Service: Urology;  Laterality: Left;  . CYSTOSCOPY WITH RETROGRADE PYELOGRAM, URETEROSCOPY AND STENT PLACEMENT Left 09/18/2013   Procedure: CYSTOSCOPY WITH RETROGRADE PYELOGRAM,  URETEROSCOPY AND  STENT PLACEMENT, removal of nephrostomy tube;  Surgeon: Jorja Loa, MD;  Location: [facility] ORS;  Service: Urology;  Laterality: Left;  . CYSTOSCOPY WITH STENT PLACEMENT Left 09/04/2014   Procedure: CYSTOSCOPY WITH LEFT J2 STENT EXCHANGE;  Surgeon: Franchot Gallo, MD;  Location: [facility] ORS;  Service: Urology;  Laterality: Left;  . ENDOVASCULAR REPAIR OF POPLITEAL ARTERY ANEURYSM  02/05/2016  . ESOPHAGOGASTRODUODENOSCOPY N/A 05/24/2013   Procedure: ESOPHAGOGASTRODUODENOSCOPY (EGD);  Surgeon: Rogene Houston, MD;  Location: [facility];  Service: Endoscopy;  Laterality: N/A;  . FALSE ANEURYSM REPAIR Right 02/05/2016   Procedure:  REPAIR RIGHT POLITEAL  ANEURYSM;  Surgeon: Serafina Mitchell, MD;  Location: [place];  Service: Vascular;  Laterality: Right;  . KNEE ARTHROSCOPY Right   . SKIN CANCER EXCISION    . TONSILLECTOMY    . TUBAL LIGATION      There were no vitals filed for this visit.  Subjective Assessment - 03/30/17 1705    Subjective   S: Both arms are sore today.    Currently in Pain?  Yes    Pain Score  2     Pain Location  Shoulder    Pain Orientation  Right;Left    Pain Descriptors / Indicators  Sore    Pain Type  Chronic pain    Pain Radiating Towards  N/A    Pain Onset  More than a month ago    Pain Frequency  Constant    Aggravating Factors   movement and use    Pain Relieving  Factors  rest    Effect of Pain on Daily Activities  max effect    Multiple Pain Sites  No         [facility] OT Assessment - 03/30/17 1706      Assessment   Medical Diagnosis  bilateral shoulder pain      Precautions   Precautions  Fall             Wound Therapy    Wound Properties Date First Assessed: 02/11/17 Time First Assessed: 1705 Wound Type: Other (Comment) Location: Elbow Location Orientation: Left Wound Description (Comments): tip of elbow  Present on Admission: No      OT Treatments/Exercises (OP) - 03/30/17 1707      Exercises   Exercises  Shoulder      Shoulder Exercises: Seated   Protraction  AAROM;10 reps    Horizontal ABduction  AROM;10 reps    External Rotation  AROM;10 reps    Internal Rotation  AROM;10 reps    Flexion  AAROM;5 reps      Shoulder Exercises: ROM/Strengthening   Over Head Lace  2' lacing with RUE. 2' unlacing with LUE.    X to V Arms  10X using left UE only limitations with ROM.     Proximal Shoulder Strengthening, Seated  10X paddle 5x criss cross       Functional Reaching Activities   Low Level  Pt completed functional reaching task using cones. Pt placed 10 cones on first shelf and then removed from shelf with her right UE. MIn difficutly with slightly increased time needed.              OT Education - 03/30/17 1740    Education provided  Yes    Education Details  Discussed session with Daughter and informed her thar her next session will be her last as her progress as appeared to go backwards. Encouraged daughter to have her Mom complete functional tasks at home versus exercises as she will be more likely to complete those versus exercises.     Person(s) Educated  Child(ren);Patient    Methods  Explanation    Comprehension  Verbalized understanding       OT Short Term Goals - 03/30/17 1745      OT SHORT TERM GOAL #1   Title  Patient will be educated and independent with HEP in order to faciliate progress in therapy  and allow her to use her BUE's with increased comfort during daily tasks.     Time  3  Period  Weeks    Status  On-going      OT SHORT TERM GOAL #2   Title  Patient will increase BUE P/ROM to [facility] to increase ability to complete dressing tasks with less difficulty.     Time  3    Period  Weeks      OT SHORT TERM GOAL #3   Title  Patient will increase BUE strength to 4-/5 to increase ability to complete normal household tasks with greater strength and ease.     Time  3    Period  Weeks    Status  On-going      OT SHORT TERM GOAL #4   Title  Patient will report a decreased pain level of 5/10 in BUE's while completing daily tasks.     Time  3    Period  Weeks    Status  On-going        OT Long Term Goals - 01/27/17 1501      OT LONG TERM GOAL #1   Title  Patient will return to highest level of independence while completing all daily tasks using BUE's with reports of decreased pain of 3/10 or less.     Time  6    Period  Weeks    Status  On-going      OT LONG TERM GOAL #2   Title  Patient will increase A/ROM of RUE to [facility] to increase ability to return to using right arm as dominant extremity.     Time  6    Period  Weeks    Status  On-going      OT LONG TERM GOAL #3   Title  Patient will increase BUE strength to 4/10 overall in order to complete normal household tasks with less difficulty.     Time  6    Period  Weeks    Status  On-going      OT LONG TERM GOAL #4   Title  Patient will decrease fasical restrictions in min amount or less in order to increase functional mobility needed for reaching overhead.     Time  6    Period  Weeks    Status  On-going            Plan - 03/30/17 1743    Clinical Impression Statement  A: Pt appears to have declined in her functional use of BUE. Her LUE is not showing the functional ROM that it was previously showing. Daughter reports that patient has not been doing her arm exercises. Session focused on functional use of BUE  during reaching tasks with patient showing a better ability to complete.     Plan  P: Discharge at next session.     Consulted and Agree with Plan of Care  Patient;Family member/caregiver    Family Member Consulted  Daughter: [name]       Patient will benefit from skilled therapeutic intervention in order to improve the following deficits and impairments:  Pain, Decreased range of motion, Increased fascial restrictions, Impaired UE functional use, Decreased strength  Visit Diagnosis: Other symptoms and signs involving the musculoskeletal system  Stiffness of left shoulder, not elsewhere classified  Acute pain of left shoulder  Acute pain of right shoulder    Problem List Patient Active Problem List   Diagnosis Date Noted  . Acute respiratory failure with hypoxia ([place]) 02/15/2017  . Leg wound, left 02/15/2017  . History of stroke 10/20/2016  . TIA (transient  ischemic attack) 10/12/2016  . Acute urinary retention 10/12/2016  . Popliteal artery aneurysm ([place]) 02/05/2016  . Malnutrition of moderate degree 12/15/2015  . DVT of popliteal vein ([place]) 12/14/2015  . Aneurysm of left internal iliac artery ([place]) 12/14/2015  . Sepsis affecting skin ([place]) 12/13/2015  . Dementia 12/13/2015  . GERD (gastroesophageal reflux disease) 12/13/2015  . Severe protein-calorie malnutrition ([place]) 12/13/2015  . Cellulitis of right leg 11/24/2015  . Dyslipidemia 11/24/2015  . Erythema of lower extremity 11/24/2015  . Anemia 11/01/2015  . Esophageal reflux 08/17/2013  . COPD with acute exacerbation ([place]) 08/11/2013  . NSTEMI (non-ST elevated myocardial infarction) type 2 07/26/2013  . Dehydration 07/25/2013  . AKI (acute kidney injury) ([place]) 07/25/2013  . Colitis 07/08/2013  . Hydronephrosis, left 07/08/2013  . Microscopic colitis 06/27/2013  . Memory loss 04/25/2013  . Depression 04/25/2013  . SAH (subarachnoid hemorrhage) ([place]) 11/22/2012  . Seizure disorder secondary to prior [facility] 11/22/2012    Ailene Ravel, OTR/L,CBIS  [phone]  03/30/2017, 5:45 PM  [place] [address] [place], [place], [address] Phone: [phone]   Fax:  [phone]  Name: Bridget Ball MRN: [id number] Date of Birth: 1928/11/19

## 2017-03-31 ENCOUNTER — Other Ambulatory Visit: Payer: Self-pay

## 2017-03-31 ENCOUNTER — Ambulatory Visit (HOSPITAL_COMMUNITY): Payer: Medicare Other | Admitting: Physical Therapy

## 2017-03-31 ENCOUNTER — Encounter (HOSPITAL_COMMUNITY): Payer: Self-pay

## 2017-03-31 ENCOUNTER — Ambulatory Visit (HOSPITAL_COMMUNITY): Payer: Medicare Other

## 2017-03-31 DIAGNOSIS — M25511 Pain in right shoulder: Secondary | ICD-10-CM

## 2017-03-31 DIAGNOSIS — M25611 Stiffness of right shoulder, not elsewhere classified: Secondary | ICD-10-CM | POA: Diagnosis not present

## 2017-03-31 DIAGNOSIS — M25612 Stiffness of left shoulder, not elsewhere classified: Secondary | ICD-10-CM | POA: Diagnosis not present

## 2017-03-31 DIAGNOSIS — R29898 Other symptoms and signs involving the musculoskeletal system: Secondary | ICD-10-CM | POA: Diagnosis not present

## 2017-03-31 DIAGNOSIS — M25512 Pain in left shoulder: Secondary | ICD-10-CM

## 2017-03-31 DIAGNOSIS — S81802S Unspecified open wound, left lower leg, sequela: Secondary | ICD-10-CM | POA: Diagnosis not present

## 2017-03-31 DIAGNOSIS — S51002S Unspecified open wound of left elbow, sequela: Secondary | ICD-10-CM | POA: Diagnosis not present

## 2017-03-31 NOTE — Therapy (Signed)
[place] [address] [place], [place], [address] Phone: [phone]   Fax:  [phone]  Occupational Therapy Treatment And reassessment Patient Details  Name: Bridget Ball MRN: [id number] Date of Birth: November 13, 1928 Referring Provider: Allyn Kenner    Encounter Date: 03/31/2017  OT End of Session - 03/31/17 1503    Visit Number  16    Number of Visits  18    Date for OT Re-Evaluation  04/02/17    Authorization Type  [employer] medicare    OT Start Time  1350    OT Stop Time  1430    OT Time Calculation (min)  40 min    Activity Tolerance  Patient limited by pain    Behavior During Therapy  [facility] for tasks assessed/performed       Past Medical History:  Diagnosis Date  . Anxiety   . Arthritis   . Attention to nephrostomy ([place])    PT HAS NEPHROSTOMY TUBE IN PLACE  . Bruises easily   . Cancer [facility])    Skin cancer  . Chronic diarrhea   . Colitis   . COPD (chronic obstructive pulmonary disease) ([place])   . Dementia   . Depression   . GERD (gastroesophageal reflux disease)   . Hiatal hernia   . Hx of bronchitis   . Hx of pulmonary edema JULY 2015  . Hx of pulmonary edema JULY 2015  . Hx of septic shock JULY 2015  . Hydronephrosis, left   . Hyperlipidemia   . Memory difficulties   . Microscopic colitis   . Myocardial infarction [facility])    unknown time   . Renal insufficiency   . Seizures ([place])    last seizure was 4-5 years ago with "brain bleed"  . Shortness of breath    with exertion  . Stroke [facility])    TIA four years ago / stroke NOV 2014    Past Surgical History:  Procedure Laterality Date  . APPENDECTOMY    . BREAST BIOPSY Right 02/02/2014   Procedure: RIGHT BREAST BIOPSY;  Surgeon: Jamesetta So, MD;  Location: [facility] ORS;  Service: General;  Laterality: Right;  . COLONOSCOPY N/A 05/24/2013   Procedure: COLONOSCOPY;  Surgeon: Rogene Houston, MD;  Location: [facility] ENDO SU[facility];  Service: Endoscopy;  Laterality: N/A;  100  .  CYSTOSCOPY W/ URETERAL STENT PLACEMENT Left 08/06/2015   Procedure: CYSTOSCOPY WITH LEFT URETERAL STENT EXCHANGE - Sammie Bench;  Surgeon: Franchot Gallo, MD;  Location: [facility] ORS;  Service: Urology;  Laterality: Left;  . CYSTOSCOPY W/ URETERAL STENT PLACEMENT Left 07/07/2016   Procedure: CYSTOSCOPY, LEFT RETROGRADE PYELOGRAM WITH LEFT URETERAL STENT REPLACEMENT;  Surgeon: Franchot Gallo, MD;  Location: [facility];  Service: Urology;  Laterality: Left;  . CYSTOSCOPY WITH RETROGRADE PYELOGRAM, URETEROSCOPY AND STENT PLACEMENT Left 09/18/2013   Procedure: CYSTOSCOPY WITH RETROGRADE PYELOGRAM,  URETEROSCOPY AND  STENT PLACEMENT, removal of nephrostomy tube;  Surgeon: Jorja Loa, MD;  Location: [facility];  Service: Urology;  Laterality: Left;  . CYSTOSCOPY WITH STENT PLACEMENT Left 09/04/2014   Procedure: CYSTOSCOPY WITH LEFT J2 STENT EXCHANGE;  Surgeon: Franchot Gallo, MD;  Location: [facility];  Service: Urology;  Laterality: Left;  . ENDOVASCULAR REPAIR OF POPLITEAL ARTERY ANEURYSM  02/05/2016  . ESOPHAGOGASTRODUODENOSCOPY N/A 05/24/2013   Procedure: ESOPHAGOGASTRODUODENOSCOPY (EGD);  Surgeon: Rogene Houston, MD;  [facility];  Service: Endoscopy;  Laterality: N/A;  . FALSE ANEURYSM REPAIR Right 02/05/2016   Procedure: REPAIR RIGHT  POLITEAL  ANEURYSM;  Surgeon: Serafina Mitchell, MD;  Location: [place];  Service: Vascular;  Laterality: Right;  . KNEE ARTHROSCOPY Right   . SKIN CANCER EXCISION    . TONSILLECTOMY    . TUBAL LIGATION      There were no vitals filed for this visit.  Subjective Assessment - 03/31/17 1454    Subjective   S: Why do my arms hurt so much?    Special Tests  FOTO score: 49/100    Currently in Pain?  Yes unable to provide a number.         [facility] OT Assessment - 03/31/17 1354      Assessment   Medical Diagnosis  bilateral shoulder pain      Precautions   Precautions  Fall      AROM   Overall AROM Comments  Assessed seated. IR/er adducted.    AROM  Assessment Site  Shoulder    Right/Left Shoulder  Left;Right    Right Shoulder Flexion  124 Degrees previous: same    Right Shoulder ABduction  130 Degrees previous: 128    Right Shoulder Internal Rotation  90 Degrees previous: same    Right Shoulder External Rotation  50 Degrees previous: 25    Left Shoulder Flexion  100 Degrees previouis: 120    Left Shoulder ABduction  60 Degrees previous: 59    Left Shoulder Internal Rotation  90 Degrees previous: same    Left Shoulder External Rotation  75 Degrees previous: 79      PROM   Overall PROM   Within functional limits for tasks performed    PROM Assessment Site  Shoulder    Right/Left Shoulder  Left;Right      Strength   Overall Strength Comments  Assessed seated. IR/er adducted.     Strength Assessment Site  Shoulder    Right/Left Shoulder  Right;Left    Right Shoulder Flexion  3/5 previous: 3-/5    Right Shoulder ABduction  3/5 previous; 3-/5    Right Shoulder Internal Rotation  3/5 previous: 3+/5    Right Shoulder External Rotation  3/5 previous: same    Left Shoulder Flexion  3/5 previous: 3+/5    Left Shoulder ABduction  3/5    Left Shoulder Internal Rotation  4-/5 previous: 3+/5    Left Shoulder External Rotation  3/5 previous: 3+/5             Wound Therapy    Wound Properties Date First Assessed: 02/11/17 Time First Assessed: 1705 Wound Type: Other (Comment) Location: Elbow Location Orientation: Left Wound Description (Comments): tip of elbow  Present on Admission: No      OT Treatments/Exercises (OP) - 03/31/17 1456      Exercises   Exercises  Shoulder      Shoulder Exercises: ROM/Strengthening   Over Head Lace  2' lacing with RUE. 2' unlacing with LUE.      Functional Reaching Activities   Low Level  pt completed low to mid level reaching task using 10 cones. Pt placed 10 cones one at a time using RUE onto 2nd shelf. She then removed the cones from the 2nd shelf down to 1st shelf. Min difficulty with use of  BUE.              OT Education - 03/31/17 1459    Education provided  Yes    Education Details  Patient and daughter were presented with meaurements from today's session. Discussed recommendations to complete functional  reaching tasks versus exercises for better results.     Person(s) Educated  Patient;Child(ren)    Methods  Explanation    Comprehension  Verbalized understanding       OT Short Term Goals - 03/31/17 1504      OT SHORT TERM GOAL #1   Title  Patient will be educated and independent with HEP in order to faciliate progress in therapy and allow her to use her BUE's with increased comfort during daily tasks.     Baseline  Patient is not completing HEP at home.     Time  3    Period  Weeks    Status  Not Met      OT SHORT TERM GOAL #2   Title  Patient will increase BUE P/ROM to [facility] to increase ability to complete dressing tasks with less difficulty.     Time  3    Period  Weeks      OT SHORT TERM GOAL #3   Title  Patient will increase BUE strength to 4-/5 to increase ability to complete normal household tasks with greater strength and ease.     Time  3    Period  Weeks    Status  Not Met      OT SHORT TERM GOAL #4   Title  Patient will report a decreased pain level of 5/10 in BUE's while completing daily tasks.     Time  3    Period  Weeks    Status  Not Met        OT Long Term Goals - 03/31/17 1505      OT LONG TERM GOAL #1   Title  Patient will return to highest level of independence while completing all daily tasks using BUE's with reports of decreased pain of 3/10 or less.     Time  6    Period  Weeks    Status  Not Met      OT LONG TERM GOAL #2   Title  Patient will increase A/ROM of RUE to [facility] to increase ability to return to using right arm as dominant extremity.     Time  6    Period  Weeks    Status  Achieved      OT LONG TERM GOAL #3   Title  Patient will increase BUE strength to 4/10 overall in order to complete normal household tasks  with less difficulty.     Time  6    Period  Weeks    Status  Not Met      OT LONG TERM GOAL #4   Title  Patient will decrease fasical restrictions in min amount or less in order to increase functional mobility needed for reaching overhead.     Time  6    Period  Weeks    Status  Achieved            Plan - 03/31/17 1504    Clinical Impression Statement  A: Reassessment and discharge completed this date due to patient's plateau in therapy. She has not been completing her HEP at home. Daughter reports that she is unable to get her to use her arms with her exercises. Pt's RUE is functional for reaching. Her LUE is very limited to approximately 50% range. Patient has met 1/4 STGs and 2/4 LTGs. Patient and daughter are in agreement with discharge recommendation.     Plan  P: D/C from therapy with HEP.  Patient will benefit from skilled therapeutic intervention in order to improve the following deficits and impairments:  Pain, Decreased range of motion, Increased fascial restrictions, Impaired UE functional use, Decreased strength  Visit Diagnosis: Other symptoms and signs involving the musculoskeletal system  Stiffness of left shoulder, not elsewhere classified  Acute pain of left shoulder  Acute pain of right shoulder    Problem List Patient Active Problem List   Diagnosis Date Noted  . Acute respiratory failure with hypoxia ([place]) 02/15/2017  . Leg wound, left 02/15/2017  . History of stroke 10/20/2016  . TIA (transient ischemic attack) 10/12/2016  . Acute urinary retention 10/12/2016  . Popliteal artery aneurysm ([place]) 02/05/2016  . Malnutrition of moderate degree 12/15/2015  . DVT of popliteal vein ([place]) 12/14/2015  . Aneurysm of left internal iliac artery ([place]) 12/14/2015  . Sepsis affecting skin ([place]) 12/13/2015  . Dementia 12/13/2015  . GERD (gastroesophageal reflux disease) 12/13/2015  . Severe protein-calorie malnutrition ([place]) 12/13/2015  . Cellulitis of  right leg 11/24/2015  . Dyslipidemia 11/24/2015  . Erythema of lower extremity 11/24/2015  . Anemia 11/01/2015  . Esophageal reflux 08/17/2013  . COPD with acute exacerbation ([place]) 08/11/2013  . NSTEMI (non-ST elevated myocardial infarction) type 2 07/26/2013  . Dehydration 07/25/2013  . AKI (acute kidney injury) ([place]) 07/25/2013  . Colitis 07/08/2013  . Hydronephrosis, left 07/08/2013  . Microscopic colitis 06/27/2013  . Memory loss 04/25/2013  . Depression 04/25/2013  . SAH (subarachnoid hemorrhage) ([place]) 11/22/2012  . Seizure disorder secondary to prior [facility] 11/22/2012    OCCUPATIONAL THERAPY DISCHARGE SUMMARY  Visits from Start of Care: 16  Current functional level related to goals / functional outcome: See above   Remaining deficits: See above   Education / Equipment: See above Plan: Patient agrees to discharge.  Patient goals were partially met. Patient is being discharged due to lack of progress.  ?????         Ailene Ravel, OTR/L,CBIS  [phone]  03/31/2017, 4:11 PM  [place] [address] [place], [place], [address] Phone: [phone]   Fax:  [phone]  Name: EILIS CHESTNUTT MRN: [id number] Date of Birth: [birth year]

## 2017-04-05 ENCOUNTER — Encounter (HOSPITAL_COMMUNITY): Payer: Medicare Other

## 2017-04-06 ENCOUNTER — Ambulatory Visit (INDEPENDENT_AMBULATORY_CARE_PROVIDER_SITE_OTHER): Payer: Medicare Other | Admitting: Internal Medicine

## 2017-04-07 ENCOUNTER — Encounter (HOSPITAL_COMMUNITY): Payer: Medicare Other

## 2017-04-07 NOTE — Addendum Note (Signed)
Addended by: Guadelupe Sabin A on: 04/07/2017 03:04 PM   Modules accepted: Orders

## 2017-04-12 ENCOUNTER — Encounter (HOSPITAL_COMMUNITY): Payer: Medicare Other | Admitting: Specialist

## 2017-04-12 DIAGNOSIS — J449 Chronic obstructive pulmonary disease, unspecified: Secondary | ICD-10-CM | POA: Diagnosis not present

## 2017-04-14 ENCOUNTER — Encounter (HOSPITAL_COMMUNITY): Payer: Medicare Other | Admitting: Occupational Therapy

## 2017-04-19 DIAGNOSIS — J449 Chronic obstructive pulmonary disease, unspecified: Secondary | ICD-10-CM | POA: Diagnosis not present

## 2017-04-19 DIAGNOSIS — R269 Unspecified abnormalities of gait and mobility: Secondary | ICD-10-CM | POA: Diagnosis not present

## 2017-04-20 ENCOUNTER — Ambulatory Visit: Payer: Medicare Other | Admitting: Adult Health

## 2017-04-27 ENCOUNTER — Other Ambulatory Visit (HOSPITAL_COMMUNITY)
Admission: RE | Admit: 2017-04-27 | Discharge: 2017-04-27 | Disposition: A | Discharge status: Home / Self Care | Payer: Medicare Other | Source: Ambulatory Visit | Attending: Urology | Admitting: Urology

## 2017-04-27 ENCOUNTER — Ambulatory Visit: Payer: Medicare Other | Admitting: Urology

## 2017-04-27 DIAGNOSIS — N131 Hydronephrosis with ureteral stricture, not elsewhere classified: Secondary | ICD-10-CM | POA: Insufficient documentation

## 2017-04-28 ENCOUNTER — Other Ambulatory Visit: Payer: Self-pay | Admitting: Urology

## 2017-04-28 DIAGNOSIS — N135 Crossing vessel and stricture of ureter without hydronephrosis: Secondary | ICD-10-CM

## 2017-04-29 LAB — URINE CULTURE

## 2017-05-03 ENCOUNTER — Encounter (HOSPITAL_COMMUNITY): Payer: Medicare Other

## 2017-05-03 ENCOUNTER — Ambulatory Visit: Payer: Medicare Other | Admitting: Surgery

## 2017-05-04 ENCOUNTER — Ambulatory Visit (INDEPENDENT_AMBULATORY_CARE_PROVIDER_SITE_OTHER): Payer: Medicare Other | Admitting: Internal Medicine

## 2017-05-04 ENCOUNTER — Encounter (INDEPENDENT_AMBULATORY_CARE_PROVIDER_SITE_OTHER): Payer: Self-pay | Admitting: Internal Medicine

## 2017-05-04 VITALS — BP 90/68 | HR 70 | Temp 97.3°F | Ht 61.0 in | Wt 93.5 lb

## 2017-05-04 DIAGNOSIS — R634 Abnormal weight loss: Secondary | ICD-10-CM | POA: Diagnosis not present

## 2017-05-04 DIAGNOSIS — K52832 Lymphocytic colitis: Secondary | ICD-10-CM

## 2017-05-04 NOTE — Patient Instructions (Signed)
Weight check in 2 months. Notify if diarrhea recurs.

## 2017-05-04 NOTE — Progress Notes (Signed)
Presenting complaint;  Follow-up for diarrhea/lymphocytic colitis and weight loss.  Subjective:  Patient is 84 year old Caucasian female who is here for scheduled visit.  She was last seen on 10/06/2016.  She is accompanied by her daughter [name]. she has not had diarrhea since her last visit.  She generally has 1 bowel movement daily but on few occasions she has been constipated and has use Dulcolax suppository with relief.  She denies melena or rectal bleeding or abdominal pain.  She has lost 7 pounds since her last visit.  Her daughter states she has good appetite she eats 3 meals.  She is also eating ice cream every night.  She does complain of exertional dyspnea with mild physical activity.  She has a history of COPD.   Current Medications: Outpatient Encounter Medications as of 05/04/2017  Medication Sig  . albuterol (PROVENTIL HFA;VENTOLIN HFA) 108 (90 BASE) MCG/ACT inhaler Inhale 2 puffs into the lungs every 6 (six) hours as needed for wheezing or shortness of breath.  Marland Kitchen albuterol (PROVENTIL) (2.5 MG/3ML) 0.083% nebulizer solution Take 2.5 mg by nebulization 2 (two) times daily.   Marland Kitchen aspirin 325 MG tablet Take 325 mg by mouth daily.  Marland Kitchen atorvastatin (LIPITOR) 80 MG tablet Take 40 mg by mouth daily at 6 PM.   . budesonide (ENTOCORT EC) 3 MG 24 hr capsule Take 6 mg by mouth daily as needed (for severe diarrhea).  . citalopram (CELEXA) 10 MG tablet TAKE ONE (1) TABLET BY MOUTH EVERY DAY  . docusate sodium (COLACE) 100 MG capsule Take 200 mg by mouth daily as needed for mild constipation.   . feeding supplement, ENSURE ENLIVE, (ENSURE ENLIVE) LIQD Take 237 mLs by mouth 2 (two) times daily between meals.  . Ferrous Sulfate 27 MG TABS Take 27 mg by mouth daily.  Marland Kitchen levETIRAcetam (KEPPRA) 250 MG tablet Take 1 tablet (250 mg total) by mouth 2 (two) times daily.  . memantine (NAMENDA XR) 14 MG CP24 24 hr capsule Take 14 mg by mouth daily.  . pantoprazole (PROTONIX) 40 MG tablet TAKE ONE TABLET (40MG   TOTAL) BY MOUTH EVERY OTHER DAY  . Potassium 99 MG TABS Take 99 mg by mouth daily.  . tamsulosin (FLOMAX) 0.4 MG CAPS capsule Take 1 capsule (0.4 mg total) by mouth daily.  . TRELEGY ELLIPTA 100-62.5-25 MCG/INH AEPB Inhale into the lungs daily.   . VESICARE 10 MG tablet Take 5 mg by mouth daily.   . memantine (NAMENDA XR) 14 MG CP24 24 hr capsule Take 1 capsule (14 mg total) by mouth daily.  . [DISCONTINUED] budesonide-formoterol (SYMBICORT) 160-4.5 MCG/ACT inhaler Inhale 2 puffs into the lungs 2 (two) times daily. (Patient not taking: Reported on 05/04/2017)  . [DISCONTINUED] predniSONE (DELTASONE) 20 MG tablet Take 3 PO QAM x3days, 2 PO QAM x3days, 1 PO QAM x3days (Patient not taking: Reported on 05/04/2017)  . [DISCONTINUED] SPIRIVA HANDIHALER 18 MCG inhalation capsule Place 1 capsule into inhaler and inhale daily.   No facility-administered encounter medications on file as of 05/04/2017.      Objective: Blood pressure 90/68, pulse 70, temperature (!) 97.3 F (36.3 C), temperature source Oral, height 5\' 1"  (1.549 m), weight 93 lb 8 oz (42.4 kg). Patient is alert and in no acute distress. Conjunctiva is pink. Sclera is nonicteric Oropharyngeal mucosa is normal. No neck masses or thyromegaly noted. Cardiac exam with regular rhythm normal S1 and S2. No murmur or gallop noted. Lungs are clear to auscultation. Abdomen is flat.  Bowel sounds are normal.  On palpation abdomen is soft and nontender with organomegaly or masses. Trace edema around ankles.   Assessment:  #1.  Lymphocytic colitis.  She was diagnosed back in May 2015 when she presented with unexplained diarrhea.  She is not having normal bowel movements and prone to constipation.  Therefore she is in remission.  Consumption of fiber rich foods may ameliorate constipation.  #2.  Weight loss.  She has dropped 7 pounds since her last visit 6 months ago.  She has very good appetite according to her daughter.  Therefore weight loss does  not appear to be due to diminished oral intake.  Weight loss may be related to her COPD.  She does not have any alarm symptoms.  Therefore will monitor her weight trend for now.   Plan:  Patient encouraged to increase consumption of fiber rich foods. If she was to get constipated she can use Dulcolax suppository or fleets enema.  She should not take laxatives by mouth. Patient encouraged to eat snacks between meals. Weight check in 2 months. Office visit in 6 months.

## 2017-05-05 ENCOUNTER — Ambulatory Visit (HOSPITAL_COMMUNITY)
Admission: RE | Admit: 2017-05-05 | Discharge: 2017-05-05 | Disposition: A | Discharge status: Home / Self Care | Payer: Medicare Other | Source: Ambulatory Visit | Attending: Urology | Admitting: Urology

## 2017-05-05 DIAGNOSIS — Q6239 Other obstructive defects of renal pelvis and ureter: Secondary | ICD-10-CM | POA: Diagnosis not present

## 2017-05-05 DIAGNOSIS — N130 Hydronephrosis with ureteropelvic junction obstruction: Secondary | ICD-10-CM | POA: Insufficient documentation

## 2017-05-05 DIAGNOSIS — N135 Crossing vessel and stricture of ureter without hydronephrosis: Secondary | ICD-10-CM

## 2017-05-12 DIAGNOSIS — J449 Chronic obstructive pulmonary disease, unspecified: Secondary | ICD-10-CM | POA: Diagnosis not present

## 2017-05-19 DIAGNOSIS — R269 Unspecified abnormalities of gait and mobility: Secondary | ICD-10-CM | POA: Diagnosis not present

## 2017-05-19 DIAGNOSIS — J449 Chronic obstructive pulmonary disease, unspecified: Secondary | ICD-10-CM | POA: Diagnosis not present

## 2017-06-07 ENCOUNTER — Encounter: Payer: Self-pay | Admitting: Physician Assistant

## 2017-06-07 ENCOUNTER — Ambulatory Visit (INDEPENDENT_AMBULATORY_CARE_PROVIDER_SITE_OTHER)
Admission: RE | Admit: 2017-06-07 | Discharge: 2017-06-07 | Disposition: A | Discharge status: Home / Self Care | Payer: Medicare Other | Source: Ambulatory Visit | Attending: Surgery | Admitting: Surgery

## 2017-06-07 ENCOUNTER — Ambulatory Visit (HOSPITAL_COMMUNITY)
Admission: RE | Admit: 2017-06-07 | Discharge: 2017-06-07 | Disposition: A | Discharge status: Home / Self Care | Payer: Medicare Other | Source: Ambulatory Visit | Attending: Surgery | Admitting: Surgery

## 2017-06-07 ENCOUNTER — Ambulatory Visit: Payer: Medicare Other | Admitting: Physician Assistant

## 2017-06-07 VITALS — BP 121/71 | HR 67 | Resp 18 | Ht 61.0 in | Wt 92.9 lb

## 2017-06-07 DIAGNOSIS — I724 Aneurysm of artery of lower extremity: Secondary | ICD-10-CM | POA: Diagnosis not present

## 2017-06-07 DIAGNOSIS — I70201 Unspecified atherosclerosis of native arteries of extremities, right leg: Secondary | ICD-10-CM | POA: Insufficient documentation

## 2017-06-07 DIAGNOSIS — R0989 Other specified symptoms and signs involving the circulatory and respiratory systems: Secondary | ICD-10-CM | POA: Insufficient documentation

## 2017-06-07 DIAGNOSIS — Z87891 Personal history of nicotine dependence: Secondary | ICD-10-CM | POA: Insufficient documentation

## 2017-06-07 DIAGNOSIS — I252 Old myocardial infarction: Secondary | ICD-10-CM | POA: Diagnosis not present

## 2017-06-07 NOTE — H&P (View-Only) (Signed)
[facility] [place] HISTORY AND PHYSICAL   History of Present Illness:  Patient is a 84 y.o. year old female who presents for evaluation of her right LE S/P right popliteal aneurysm. She was diagnosed originally with likely necrotizing fasciitis but rather a CT scan revealed a large right popliteal aneurysm which was compressing the femoral vein. It was felt this was the source of her newly diagnosed DVT and subsequent cellulitis. She got better with IV heparin and IV antibiotics and was ultimately discharged home on Xaralto.   On 02/05/2016 she underwent primary repair of her right popliteal aneurysm. She is here today for f/u with repeat  ABI's and arterial right LE duplex.  There has been a increase in the velocity in the distal superficial femoral artery suggesting a 50-74 percent stenosis.  Velocity is 282.  She denise rest pain, nonhealing ulcers and no symptoms of claudication.  She has been started on home O2 secondary to COPD.  She continues to take her Lipitor and 325 mg aspirin daily.        Past Medical History:  Diagnosis Date  . Anxiety   . Arthritis   . Attention to nephrostomy ([place])    PT HAS NEPHROSTOMY TUBE IN PLACE  . Bruises easily   . Cancer [facility])    Skin cancer  . Chronic diarrhea   . Colitis   . COPD (chronic obstructive pulmonary disease) ([place])   . Dementia   . Depression   . GERD (gastroesophageal reflux disease)   . Hiatal hernia   . Hx of bronchitis   . Hx of pulmonary edema JULY 2015  . Hx of pulmonary edema JULY 2015  . Hx of septic shock JULY 2015  . Hydronephrosis, left   . Hyperlipidemia   . Memory difficulties   . Microscopic colitis   . Myocardial infarction [facility])    unknown time   . Renal insufficiency   . Seizures ([place])    last seizure was 4-5 years ago with "brain bleed"  . Shortness of breath    with exertion  . Stroke [facility])    TIA four years ago / stroke NOV 2014    Past Surgical History:  Procedure  Laterality Date  . APPENDECTOMY    . BREAST BIOPSY Right 02/02/2014   Procedure: RIGHT BREAST BIOPSY;  Surgeon: Jamesetta So, MD;  Location: [facility] ORS;  Service: General;  Laterality: Right;  . COLONOSCOPY N/A 05/24/2013   Procedure: COLONOSCOPY;  Surgeon: Rogene Houston, MD;  Location: [facility] ENDO SUITE;  Service: Endoscopy;  Laterality: N/A;  100  . CYSTOSCOPY W/ URETERAL STENT PLACEMENT Left 08/06/2015   Procedure: CYSTOSCOPY WITH LEFT URETERAL STENT EXCHANGE - Sammie Bench;  Surgeon: Franchot Gallo, MD;  Location: [facility] ORS;  Service: Urology;  Laterality: Left;  . CYSTOSCOPY W/ URETERAL STENT PLACEMENT Left 07/07/2016   Procedure: CYSTOSCOPY, LEFT RETROGRADE PYELOGRAM WITH LEFT URETERAL STENT REPLACEMENT;  Surgeon: Franchot Gallo, MD;  Location: [facility] ORS;  Service: Urology;  Laterality: Left;  . CYSTOSCOPY WITH RETROGRADE PYELOGRAM, URETEROSCOPY AND STENT PLACEMENT Left 09/18/2013   Procedure: CYSTOSCOPY WITH RETROGRADE PYELOGRAM,  URETEROSCOPY AND  STENT PLACEMENT, removal of nephrostomy tube;  Surgeon: Jorja Loa, MD;  Location: [facility] ORS;  Service: Urology;  Laterality: Left;  . CYSTOSCOPY WITH STENT PLACEMENT Left 09/04/2014   Procedure: CYSTOSCOPY WITH LEFT J2 STENT EXCHANGE;  Surgeon: Franchot Gallo, MD;  Location: [facility] ORS;  Service: Urology;  Laterality: Left;  . ENDOVASCULAR REPAIR OF POPLITEAL  ARTERY ANEURYSM  02/05/2016  . ESOPHAGOGASTRODUODENOSCOPY N/A 05/24/2013   Procedure: ESOPHAGOGASTRODUODENOSCOPY (EGD);  Surgeon: Rogene Houston, MD;  Location[facility] SUITE;  Service: Endoscopy;  Laterality: N/A;  . FALSE ANEURYSM REPAIR Right 02/05/2016   Procedure: REPAIR RIGHT POLITEAL  ANEURYSM;  Surgeon: Serafina Mitchell, MD;  Location: [facility];  Service: Vascular;  Laterality: Right;  . KNEE ARTHROSCOPY Right   . SKIN CANCER EXCISION    . TONSILLECTOMY    . TUBAL LIGATION      ROS:   General:  No weight loss, Fever, chills  HEENT: No recent headaches, no nasal bleeding, no visual  changes, no sore throat  Neurologic: No dizziness, blackouts, seizures. No recent symptoms of stroke or mini- stroke. No recent episodes of slurred speech, or temporary blindness.  Cardiac: No recent episodes of chest pain/pressure, positive shortness of breath at rest.  positive shortness of breath with exertion.  Denies history of atrial fibrillation or irregular heartbeat  Vascular: No history of rest pain in feet.  No history of claudication.  No history of non-healing ulcer, No history of DVT   Pulmonary: Positve home oxygen, no productive cough, no hemoptysis,  Positive  asthma or wheezing  Musculoskeletal:  [x ] Arthritis, [ ]  Low back pain,  [ x] Joint pain  Hematologic:No history of hypercoagulable state.  No history of easy bleeding.  No history of anemia  Gastrointestinal: No hematochezia or melena,  No gastroesophageal reflux, no trouble swallowing  Urinary: [ ]  chronic Kidney disease, [ ]  on HD - [ ]  MWF or [ ]  TTHS, [ ]  Burning with urination, [ ]  Frequent urination, [ ]  Difficulty urinating;   Skin: No rashes  Psychological: No history of anxiety,  No history of depression  Social History Social History   Tobacco Use  . Smoking status: Former Smoker    Packs/day: 1.00    Years: 50.00    Pack years: 50.00    Types: Cigarettes    Last attempt to quit: 11/13/1997    Years since quitting: 19.5  . Smokeless tobacco: Never Used  Substance Use Topics  . Alcohol use: Yes    Alcohol/week: 4.2 oz    Types: 7 Glasses of wine per week    Comment: 2 glasses a night wine (none in a year)  . Drug use: No    Family History Family History  Problem Relation Age of Onset  . Diabetes Mother   . Stroke Mother   . Heart attack Father     Allergies  Allergies  Allergen Reactions  . Penicillins Anaphylaxis    Throat swelling as a child. Tolerated ceftriaxone 07/29/13.      Current Outpatient Medications  Medication Sig Dispense Refill  . albuterol (PROVENTIL  HFA;VENTOLIN HFA) 108 (90 BASE) MCG/ACT inhaler Inhale 2 puffs into the lungs every 6 (six) hours as needed for wheezing or shortness of breath. 1 Inhaler 0  . albuterol (PROVENTIL) (2.5 MG/3ML) 0.083% nebulizer solution Take 2.5 mg by nebulization 2 (two) times daily.     Marland Kitchen aspirin 325 MG tablet Take 325 mg by mouth daily.    Marland Kitchen atorvastatin (LIPITOR) 80 MG tablet Take 40 mg by mouth daily at 6 PM.     . budesonide (ENTOCORT EC) 3 MG 24 hr capsule Take 6 mg by mouth daily as needed (for severe diarrhea).    . citalopram (CELEXA) 10 MG tablet TAKE ONE (1) TABLET BY MOUTH EVERY DAY 90 tablet 3  . docusate sodium (  COLACE) 100 MG capsule Take 200 mg by mouth daily as needed for mild constipation.     . feeding supplement, ENSURE ENLIVE, (ENSURE ENLIVE) LIQD Take 237 mLs by mouth 2 (two) times daily between meals. 237 mL 12  . Ferrous Sulfate 27 MG TABS Take 27 mg by mouth daily.    Marland Kitchen levETIRAcetam (KEPPRA) 250 MG tablet Take 1 tablet (250 mg total) by mouth 2 (two) times daily. 60 tablet 5  . memantine (NAMENDA XR) 14 MG CP24 24 hr capsule Take 1 capsule (14 mg total) by mouth daily. 30 capsule 0  . memantine (NAMENDA XR) 14 MG CP24 24 hr capsule Take 14 mg by mouth daily.    . pantoprazole (PROTONIX) 40 MG tablet TAKE ONE TABLET (40MG  TOTAL) BY MOUTH EVERY OTHER DAY 45 tablet 5  . Potassium 99 MG TABS Take 99 mg by mouth daily.    . tamsulosin (FLOMAX) 0.4 MG CAPS capsule Take 1 capsule (0.4 mg total) by mouth daily. 30 capsule 2  . TRELEGY ELLIPTA 100-62.5-25 MCG/INH AEPB Inhale into the lungs daily.     . VESICARE 10 MG tablet Take 5 mg by mouth daily.      No current facility-administered medications for this visit.     Physical Examination  There were no vitals filed for this visit.  There is no height or weight on file to calculate BMI.  General:  Alert and oriented, no acute distress HEENT: Normal Neck: No bruit or JVD Pulmonary: Clear to auscultation bilaterally Cardiac: Regular  Rate and Rhythm without murmur Abdomen: Soft, non-tender, non-distended, no mass, no scars Skin: No rash Extremity Pulses:  2+ radial, brachial, femoral, dorsalis pedis, posterior tibial pulses bilaterally Musculoskeletal: No deformity or edema  Neurologic: Upper and lower extremity motor 5/5 and symmetric  DATA:  ABI's  Right 1.57, TBI 0.77 Left 1.89, TBI 0.95 ABI elevated likely due to calcification of the arteries that are noncompressible.  Right LE arterial duplex: Triphasic/Biphasic flow with an elevated velocity in the distal SFA now 391 with 75-99% stenosis.  ASSESSMENT:    02/05/2016 primary repair of her right popliteal aneurysm.   PLAN:  She is doing very well without symptoms or claudication, rest pain or nonhealing wounds.  She continues to have palpable DP pulses.  She denise any changes since her last visit.    The artrial duplex indicates distal SFA stenosis at the proximal anastomosis.  We will schedule her for angiogram of the right LE with possible intervention in 2-3 weeks with Dr. Trula Slade.    Her daughter was with her today and they agree with this plan.   Roxy Horseman PA-C [facility]  MD in clinic [place]

## 2017-06-07 NOTE — Progress Notes (Signed)
[facility]  HISTORY AND PHYSICAL   History of Present Illness:  Patient is a 84 y.o. year old female who presents for evaluation of her right LE S/P right popliteal aneurysm. She was diagnosed originally with likely necrotizing fasciitis but rather a CT scan revealed a large right popliteal aneurysm which was compressing the femoral vein. It was felt this was the source of her newly diagnosed DVT and subsequent cellulitis. She got better with IV heparin and IV antibiotics and was ultimately discharged home on Xaralto.   On 02/05/2016 she underwent primary repair of her right popliteal aneurysm. She is here today for f/u with repeat  ABI's and arterial right LE duplex.  There has been a increase in the velocity in the distal superficial femoral artery suggesting a 50-74 percent stenosis.  Velocity is 282.  She denise rest pain, nonhealing ulcers and no symptoms of claudication.  She has been started on home O2 secondary to COPD.  She continues to take her Lipitor and 325 mg aspirin daily.        Past Medical History:  Diagnosis Date  . Anxiety   . Arthritis   . Attention to nephrostomy ([place])    PT HAS NEPHROSTOMY TUBE IN PLACE  . Bruises easily   . Cancer [facility])    Skin cancer  . Chronic diarrhea   . Colitis   . COPD (chronic obstructive pulmonary disease) ([place])   . Dementia   . Depression   . GERD (gastroesophageal reflux disease)   . Hiatal hernia   . Hx of bronchitis   . Hx of pulmonary edema JULY 2015  . Hx of pulmonary edema JULY 2015  . Hx of septic shock JULY 2015  . Hydronephrosis, left   . Hyperlipidemia   . Memory difficulties   . Microscopic colitis   . Myocardial infarction [facility])    unknown time   . Renal insufficiency   . Seizures ([place])    last seizure was 4-5 years ago with "brain bleed"  . Shortness of breath    with exertion  . Stroke [facility])    TIA four years ago / stroke NOV 2014    Past Surgical History:  Procedure  Laterality Date  . APPENDECTOMY    . BREAST BIOPSY Right 02/02/2014   Procedure: RIGHT BREAST BIOPSY;  Surgeon: Jamesetta So, MD;  Location: [facility] ORS;  Service: General;  Laterality: Right;  . COLONOSCOPY N/A 05/24/2013   Procedure: COLONOSCOPY;  Surgeon: Rogene Houston, MD;  Location: [facility] ENDO SUITE;  Service: Endoscopy;  Laterality: N/A;  100  . CYSTOSCOPY W/ URETERAL STENT PLACEMENT Left 08/06/2015   Procedure: CYSTOSCOPY WITH LEFT URETERAL STENT EXCHANGE - Sammie Bench;  Surgeon: Franchot Gallo, MD;  Location: [facility] ORS;  Service: Urology;  Laterality: Left;  . CYSTOSCOPY W/ URETERAL STENT PLACEMENT Left 07/07/2016   Procedure: CYSTOSCOPY, LEFT RETROGRADE PYELOGRAM WITH LEFT URETERAL STENT REPLACEMENT;  Surgeon: Franchot Gallo, MD;  Location: [facility] OR[facility];  Service: Urology;  Laterality: Left;  . CYSTOSCOPY WITH RETROGRADE PYELOGRAM, URETEROSCOPY AND STENT PLACEMENT Left 09/18/2013   Procedure: CYSTOSCOPY WITH RETROGRADE PYELOGRAM,  URETEROSCOPY AND  STENT PLACEMENT, removal of nephrostomy tube;  Surgeon: Jorja Loa, MD;  Location[facility];  Service: Urology;  Laterality: Left;  . CYSTOSCOPY WITH STENT PLACEMENT Left 09/04/2014   Procedure: CYSTOSCOPY WITH LEFT J2 STENT EXCHANGE;  Surgeon: Franchot Gallo, MD;  Location: [facility];  Service: Urology;  Laterality: Left;  . ENDOVASCULAR REPAIR OF POPLITEAL  ARTERY ANEURYSM  02/05/2016  . ESOPHAGOGASTRODUODENOSCOPY N/A 05/24/2013   Procedure: ESOPHAGOGASTRODUODENOSCOPY (EGD);  Surgeon: Rogene Houston, MD;  [facility];  Service: Endoscopy;  Laterality: N/A;  . FALSE ANEURYSM REPAIR Right 02/05/2016   Procedure: REPAIR RIGHT POLITEAL  ANEURYSM;  Surgeon: Serafina Mitchell, MD;  Location: [facility];  Service: Vascular;  Laterality: Right;  . KNEE ARTHROSCOPY Right   . SKIN CANCER EXCISION    . TONSILLECTOMY    . TUBAL LIGATION      ROS:   General:  No weight loss, Fever, chills  HEENT: No recent headaches, no nasal bleeding, no visual  changes, no sore throat  Neurologic: No dizziness, blackouts, seizures. No recent symptoms of stroke or mini- stroke. No recent episodes of slurred speech, or temporary blindness.  Cardiac: No recent episodes of chest pain/pressure, positive shortness of breath at rest.  positive shortness of breath with exertion.  Denies history of atrial fibrillation or irregular heartbeat  Vascular: No history of rest pain in feet.  No history of claudication.  No history of non-healing ulcer, No history of DVT   Pulmonary: Positve home oxygen, no productive cough, no hemoptysis,  Positive  asthma or wheezing  Musculoskeletal:  [x ] Arthritis, [ ]  Low back pain,  [ x] Joint pain  Hematologic:No history of hypercoagulable state.  No history of easy bleeding.  No history of anemia  Gastrointestinal: No hematochezia or melena,  No gastroesophageal reflux, no trouble swallowing  Urinary: [ ]  chronic Kidney disease, [ ]  on HD - [ ]  MWF or [ ]  TTHS, [ ]  Burning with urination, [ ]  Frequent urination, [ ]  Difficulty urinating;   Skin: No rashes  Psychological: No history of anxiety,  No history of depression  Social History Social History   Tobacco Use  . Smoking status: Former Smoker    Packs/day: 1.00    Years: 50.00    Pack years: 50.00    Types: Cigarettes    Last attempt to quit: 11/13/1997    Years since quitting: 19.5  . Smokeless tobacco: Never Used  Substance Use Topics  . Alcohol use: Yes    Alcohol/week: 4.2 oz    Types: 7 Glasses of wine per week    Comment: 2 glasses a night wine (none in a year)  . Drug use: No    Family History Family History  Problem Relation Age of Onset  . Diabetes Mother   . Stroke Mother   . Heart attack Father     Allergies  Allergies  Allergen Reactions  . Penicillins Anaphylaxis    Throat swelling as a child. Tolerated ceftriaxone 07/29/13.      Current Outpatient Medications  Medication Sig Dispense Refill  . albuterol (PROVENTIL  HFA;VENTOLIN HFA) 108 (90 BASE) MCG/ACT inhaler Inhale 2 puffs into the lungs every 6 (six) hours as needed for wheezing or shortness of breath. 1 Inhaler 0  . albuterol (PROVENTIL) (2.5 MG/3ML) 0.083% nebulizer solution Take 2.5 mg by nebulization 2 (two) times daily.     Marland Kitchen aspirin 325 MG tablet Take 325 mg by mouth daily.    Marland Kitchen atorvastatin (LIPITOR) 80 MG tablet Take 40 mg by mouth daily at 6 PM.     . budesonide (ENTOCORT EC) 3 MG 24 hr capsule Take 6 mg by mouth daily as needed (for severe diarrhea).    . citalopram (CELEXA) 10 MG tablet TAKE ONE (1) TABLET BY MOUTH EVERY DAY 90 tablet 3  . docusate sodium (  COLACE) 100 MG capsule Take 200 mg by mouth daily as needed for mild constipation.     . feeding supplement, ENSURE ENLIVE, (ENSURE ENLIVE) LIQD Take 237 mLs by mouth 2 (two) times daily between meals. 237 mL 12  . Ferrous Sulfate 27 MG TABS Take 27 mg by mouth daily.    Marland Kitchen levETIRAcetam (KEPPRA) 250 MG tablet Take 1 tablet (250 mg total) by mouth 2 (two) times daily. 60 tablet 5  . memantine (NAMENDA XR) 14 MG CP24 24 hr capsule Take 1 capsule (14 mg total) by mouth daily. 30 capsule 0  . memantine (NAMENDA XR) 14 MG CP24 24 hr capsule Take 14 mg by mouth daily.    . pantoprazole (PROTONIX) 40 MG tablet TAKE ONE TABLET (40MG  TOTAL) BY MOUTH EVERY OTHER DAY 45 tablet 5  . Potassium 99 MG TABS Take 99 mg by mouth daily.    . tamsulosin (FLOMAX) 0.4 MG CAPS capsule Take 1 capsule (0.4 mg total) by mouth daily. 30 capsule 2  . TRELEGY ELLIPTA 100-62.5-25 MCG/INH AEPB Inhale into the lungs daily.     . VESICARE 10 MG tablet Take 5 mg by mouth daily.      No current facility-administered medications for this visit.     Physical Examination  There were no vitals filed for this visit.  There is no height or weight on file to calculate BMI.  General:  Alert and oriented, no acute distress HEENT: Normal Neck: No bruit or JVD Pulmonary: Clear to auscultation bilaterally Cardiac: Regular  Rate and Rhythm without murmur Abdomen: Soft, non-tender, non-distended, no mass, no scars Skin: No rash Extremity Pulses:  2+ radial, brachial, femoral, dorsalis pedis, posterior tibial pulses bilaterally Musculoskeletal: No deformity or edema  Neurologic: Upper and lower extremity motor 5/5 and symmetric  DATA:  ABI's  Right 1.57, TBI 0.77 Left 1.89, TBI 0.95 ABI elevated likely due to calcification of the arteries that are noncompressible.  Right LE arterial duplex: Triphasic/Biphasic flow with an elevated velocity in the distal SFA now 391 with 75-99% stenosis.  ASSESSMENT:    02/05/2016 primary repair of her right popliteal aneurysm.   PLAN:  She is doing very well without symptoms or claudication, rest pain or nonhealing wounds.  She continues to have palpable DP pulses.  She denise any changes since her last visit.    The artrial duplex indicates distal SFA stenosis at the proximal anastomosis.  We will schedule her for angiogram of the right LE with possible intervention in 2-3 weeks with Dr. Trula Slade.    Her daughter was with her today and they agree with this plan.   Roxy Horseman PA-C [facility]  MD in clinic [place]

## 2017-06-08 ENCOUNTER — Other Ambulatory Visit: Payer: Self-pay | Admitting: Urology

## 2017-06-09 DIAGNOSIS — J441 Chronic obstructive pulmonary disease with (acute) exacerbation: Secondary | ICD-10-CM | POA: Diagnosis not present

## 2017-06-09 DIAGNOSIS — L539 Erythematous condition, unspecified: Secondary | ICD-10-CM | POA: Diagnosis not present

## 2017-06-12 DIAGNOSIS — J449 Chronic obstructive pulmonary disease, unspecified: Secondary | ICD-10-CM | POA: Diagnosis not present

## 2017-06-14 ENCOUNTER — Encounter (HOSPITAL_COMMUNITY): Payer: Medicare Other

## 2017-06-14 ENCOUNTER — Ambulatory Visit: Payer: Medicare Other | Admitting: Surgery

## 2017-06-15 DIAGNOSIS — J449 Chronic obstructive pulmonary disease, unspecified: Secondary | ICD-10-CM | POA: Diagnosis not present

## 2017-06-16 ENCOUNTER — Other Ambulatory Visit: Payer: Self-pay | Admitting: Neurology

## 2017-06-16 DIAGNOSIS — E782 Mixed hyperlipidemia: Secondary | ICD-10-CM | POA: Diagnosis not present

## 2017-06-17 ENCOUNTER — Other Ambulatory Visit: Payer: Self-pay

## 2017-06-17 ENCOUNTER — Other Ambulatory Visit: Payer: Self-pay | Admitting: *Deleted

## 2017-06-17 MED ORDER — LEVETIRACETAM 250 MG PO TABS: 250.0000 mg | ORAL_TABLET | Freq: Two times a day (BID) | ORAL | 4 refills | Status: AC

## 2017-06-17 MED ORDER — LEVETIRACETAM 250 MG PO TABS: 250.0000 mg | ORAL_TABLET | Freq: Two times a day (BID) | ORAL | 4 refills | Status: DC

## 2017-06-17 NOTE — Progress Notes (Signed)
Phone call  With patient's daughter and POA Webb Silversmith. Instructed to be at [facility] admitting department at 8:30 am on 06/22/2017 for procedure. Instructed to remain on ASA. NPO past MN night prior except for Keppra and inhalers am of procedure. Verbalized understanding.

## 2017-06-19 ENCOUNTER — Other Ambulatory Visit: Payer: Self-pay | Admitting: Neurology

## 2017-06-19 DIAGNOSIS — J449 Chronic obstructive pulmonary disease, unspecified: Secondary | ICD-10-CM | POA: Diagnosis not present

## 2017-06-19 DIAGNOSIS — R269 Unspecified abnormalities of gait and mobility: Secondary | ICD-10-CM | POA: Diagnosis not present

## 2017-06-21 ENCOUNTER — Other Ambulatory Visit: Payer: Self-pay

## 2017-06-21 MED ORDER — MEMANTINE HCL ER 14 MG PO CP24: 14.0000 mg | ORAL_CAPSULE | Freq: Every day | ORAL | 4 refills | Status: DC

## 2017-06-22 ENCOUNTER — Encounter (HOSPITAL_COMMUNITY): Payer: Self-pay | Admitting: Surgery

## 2017-06-22 ENCOUNTER — Other Ambulatory Visit: Payer: Self-pay

## 2017-06-22 ENCOUNTER — Encounter (HOSPITAL_COMMUNITY)
Admission: RE | Disposition: A | Discharge status: Home / Self Care | Payer: Self-pay | Source: Ambulatory Visit | Attending: Surgery

## 2017-06-22 ENCOUNTER — Ambulatory Visit (HOSPITAL_COMMUNITY)
Admission: RE | Admit: 2017-06-22 | Discharge: 2017-06-23 | Disposition: A | Discharge status: Home / Self Care | Payer: Medicare Other | Source: Ambulatory Visit | Attending: Surgery | Admitting: Surgery

## 2017-06-22 DIAGNOSIS — Z9851 Tubal ligation status: Secondary | ICD-10-CM | POA: Insufficient documentation

## 2017-06-22 DIAGNOSIS — Z9981 Dependence on supplemental oxygen: Secondary | ICD-10-CM | POA: Insufficient documentation

## 2017-06-22 DIAGNOSIS — Z79899 Other long term (current) drug therapy: Secondary | ICD-10-CM | POA: Diagnosis not present

## 2017-06-22 DIAGNOSIS — Z7982 Long term (current) use of aspirin: Secondary | ICD-10-CM | POA: Diagnosis not present

## 2017-06-22 DIAGNOSIS — N289 Disorder of kidney and ureter, unspecified: Secondary | ICD-10-CM | POA: Diagnosis not present

## 2017-06-22 DIAGNOSIS — I739 Peripheral vascular disease, unspecified: Secondary | ICD-10-CM | POA: Diagnosis not present

## 2017-06-22 DIAGNOSIS — Y831 Surgical operation with implant of artificial internal device as the cause of abnormal reaction of the patient, or of later complication, without mention of misadventure at the time of the procedure: Secondary | ICD-10-CM | POA: Diagnosis not present

## 2017-06-22 DIAGNOSIS — Z880 Allergy status to penicillin: Secondary | ICD-10-CM | POA: Insufficient documentation

## 2017-06-22 DIAGNOSIS — Z87891 Personal history of nicotine dependence: Secondary | ICD-10-CM | POA: Insufficient documentation

## 2017-06-22 DIAGNOSIS — K219 Gastro-esophageal reflux disease without esophagitis: Secondary | ICD-10-CM | POA: Diagnosis not present

## 2017-06-22 DIAGNOSIS — Z8249 Family history of ischemic heart disease and other diseases of the circulatory system: Secondary | ICD-10-CM | POA: Diagnosis not present

## 2017-06-22 DIAGNOSIS — Z881 Allergy status to other antibiotic agents status: Secondary | ICD-10-CM | POA: Insufficient documentation

## 2017-06-22 DIAGNOSIS — M1990 Unspecified osteoarthritis, unspecified site: Secondary | ICD-10-CM | POA: Diagnosis not present

## 2017-06-22 DIAGNOSIS — T82856A Stenosis of peripheral vascular stent, initial encounter: Secondary | ICD-10-CM

## 2017-06-22 DIAGNOSIS — I724 Aneurysm of artery of lower extremity: Secondary | ICD-10-CM | POA: Insufficient documentation

## 2017-06-22 DIAGNOSIS — F419 Anxiety disorder, unspecified: Secondary | ICD-10-CM | POA: Insufficient documentation

## 2017-06-22 DIAGNOSIS — T82858A Stenosis of vascular prosthetic devices, implants and grafts, initial encounter: Secondary | ICD-10-CM | POA: Diagnosis not present

## 2017-06-22 DIAGNOSIS — J449 Chronic obstructive pulmonary disease, unspecified: Secondary | ICD-10-CM | POA: Insufficient documentation

## 2017-06-22 DIAGNOSIS — I252 Old myocardial infarction: Secondary | ICD-10-CM | POA: Diagnosis not present

## 2017-06-22 DIAGNOSIS — F329 Major depressive disorder, single episode, unspecified: Secondary | ICD-10-CM | POA: Insufficient documentation

## 2017-06-22 DIAGNOSIS — F0390 Unspecified dementia without behavioral disturbance: Secondary | ICD-10-CM | POA: Diagnosis not present

## 2017-06-22 DIAGNOSIS — Z960 Presence of urogenital implants: Secondary | ICD-10-CM | POA: Insufficient documentation

## 2017-06-22 DIAGNOSIS — Z936 Other artificial openings of urinary tract status: Secondary | ICD-10-CM | POA: Insufficient documentation

## 2017-06-22 DIAGNOSIS — E785 Hyperlipidemia, unspecified: Secondary | ICD-10-CM | POA: Diagnosis not present

## 2017-06-22 DIAGNOSIS — Z823 Family history of stroke: Secondary | ICD-10-CM | POA: Insufficient documentation

## 2017-06-22 DIAGNOSIS — Z9889 Other specified postprocedural states: Secondary | ICD-10-CM | POA: Insufficient documentation

## 2017-06-22 DIAGNOSIS — Z8673 Personal history of transient ischemic attack (TIA), and cerebral infarction without residual deficits: Secondary | ICD-10-CM | POA: Insufficient documentation

## 2017-06-22 DIAGNOSIS — Z85828 Personal history of other malignant neoplasm of skin: Secondary | ICD-10-CM | POA: Diagnosis not present

## 2017-06-22 HISTORY — DX: Unspecified malignant neoplasm of skin, unspecified: C44.90

## 2017-06-22 HISTORY — PX: ABDOMINAL AORTOGRAM W/LOWER EXTREMITY: CATH118223

## 2017-06-22 HISTORY — PX: PERIPHERAL VASCULAR INTERVENTION: CATH118257

## 2017-06-22 HISTORY — DX: Dependence on supplemental oxygen: Z99.81

## 2017-06-22 LAB — POCT I-STAT, CHEM 8
BUN: 18 mg/dL (ref 6–20)
CHLORIDE: 103 mmol/L (ref 101–111)
Calcium, Ion: 1.2 mmol/L (ref 1.15–1.40)
Creatinine, Ser: 0.9 mg/dL (ref 0.44–1.00)
Glucose, Bld: 82 mg/dL (ref 65–99)
HEMATOCRIT: 34 % — AB (ref 36.0–46.0)
Hemoglobin: 11.6 g/dL — ABNORMAL LOW (ref 12.0–15.0)
POTASSIUM: 4.1 mmol/L (ref 3.5–5.1)
SODIUM: 141 mmol/L (ref 135–145)
TCO2: 27 mmol/L (ref 22–32)

## 2017-06-22 LAB — POCT ACTIVATED CLOTTING TIME
ACTIVATED CLOTTING TIME: 202 s
ACTIVATED CLOTTING TIME: 147 s

## 2017-06-22 SURGERY — ABDOMINAL AORTOGRAM W/LOWER EXTREMITY
Anesthesia: LOCAL | Laterality: Right

## 2017-06-22 MED ORDER — FLUTICASONE-UMECLIDIN-VILANT 100-62.5-25 MCG/INH IN AEPB: 1.0000 | INHALATION_SPRAY | Freq: Every day | RESPIRATORY_TRACT | Status: DC

## 2017-06-22 MED ORDER — LIDOCAINE HCL (PF) 1 % IJ SOLN
INTRAMUSCULAR | Status: AC
  Filled 2017-06-22: qty 30

## 2017-06-22 MED ORDER — SODIUM CHLORIDE 0.9 % IV SOLN: 250.0000 mL | INTRAVENOUS | Status: DC | PRN

## 2017-06-22 MED ORDER — UMECLIDINIUM BROMIDE 62.5 MCG/INH IN AEPB
1.0000 | INHALATION_SPRAY | Freq: Every day | RESPIRATORY_TRACT | Status: DC
  Administered 2017-06-23: 08:00:00 1 via RESPIRATORY_TRACT
  Filled 2017-06-22: qty 7

## 2017-06-22 MED ORDER — ONDANSETRON HCL 4 MG/2ML IJ SOLN: 4.0000 mg | Freq: Four times a day (QID) | INTRAMUSCULAR | Status: DC | PRN

## 2017-06-22 MED ORDER — HEPARIN SODIUM (PORCINE) 1000 UNIT/ML IJ SOLN
INTRAMUSCULAR | Status: AC
  Filled 2017-06-22: qty 1

## 2017-06-22 MED ORDER — FENTANYL CITRATE (PF) 100 MCG/2ML IJ SOLN
INTRAMUSCULAR | Status: DC | PRN
  Administered 2017-06-22: 25 ug via INTRAVENOUS

## 2017-06-22 MED ORDER — CITALOPRAM HYDROBROMIDE 20 MG PO TABS
10.0000 mg | ORAL_TABLET | Freq: Every day | ORAL | Status: DC
  Administered 2017-06-22 – 2017-06-23 (×2): 10 mg via ORAL
  Filled 2017-06-22 (×2): qty 1

## 2017-06-22 MED ORDER — SODIUM CHLORIDE 0.9 % IV SOLN
INTRAVENOUS | Status: DC
  Administered 2017-06-22: 10:00:00 via INTRAVENOUS

## 2017-06-22 MED ORDER — ASPIRIN EC 81 MG PO TBEC
81.0000 mg | DELAYED_RELEASE_TABLET | Freq: Every day | ORAL | Status: DC
  Administered 2017-06-22 – 2017-06-23 (×2): 81 mg via ORAL
  Filled 2017-06-22 (×2): qty 1

## 2017-06-22 MED ORDER — ASPIRIN 325 MG PO TABS: 325.0000 mg | ORAL_TABLET | Freq: Every day | ORAL | Status: DC

## 2017-06-22 MED ORDER — HEPARIN (PORCINE) IN NACL 1000-0.9 UT/500ML-% IV SOLN
INTRAVENOUS | Status: AC
  Filled 2017-06-22: qty 500

## 2017-06-22 MED ORDER — HEPARIN (PORCINE) IN NACL 2-0.9 UNITS/ML
INTRAMUSCULAR | Status: AC | PRN
  Administered 2017-06-22: 1000 mL

## 2017-06-22 MED ORDER — POTASSIUM 99 MG PO TABS: 99.0000 mg | ORAL_TABLET | Freq: Every day | ORAL | Status: DC

## 2017-06-22 MED ORDER — DOCUSATE SODIUM 100 MG PO CAPS
100.0000 mg | ORAL_CAPSULE | Freq: Every day | ORAL | Status: DC
Start: 2017-06-22 — End: 2017-06-23
  Administered 2017-06-22 – 2017-06-23 (×2): 100 mg via ORAL
  Filled 2017-06-22 (×2): qty 1

## 2017-06-22 MED ORDER — SODIUM CHLORIDE 0.9% FLUSH: 3.0000 mL | INTRAVENOUS | Status: DC | PRN

## 2017-06-22 MED ORDER — LIDOCAINE HCL (PF) 1 % IJ SOLN
INTRAMUSCULAR | Status: DC | PRN
  Administered 2017-06-22: 14 mL

## 2017-06-22 MED ORDER — FENTANYL CITRATE (PF) 100 MCG/2ML IJ SOLN
INTRAMUSCULAR | Status: AC
  Filled 2017-06-22: qty 2

## 2017-06-22 MED ORDER — MOMETASONE FURO-FORMOTEROL FUM 100-5 MCG/ACT IN AERO
2.0000 | INHALATION_SPRAY | Freq: Two times a day (BID) | RESPIRATORY_TRACT | Status: DC
  Administered 2017-06-22 – 2017-06-23 (×2): 2 via RESPIRATORY_TRACT
  Filled 2017-06-22: qty 8.8

## 2017-06-22 MED ORDER — MORPHINE SULFATE (PF) 10 MG/ML IV SOLN: 1.0000 mg | INTRAVENOUS | Status: DC | PRN

## 2017-06-22 MED ORDER — BUDESONIDE 3 MG PO CPEP
6.0000 mg | ORAL_CAPSULE | Freq: Every day | ORAL | Status: DC | PRN
  Filled 2017-06-22: qty 2

## 2017-06-22 MED ORDER — SODIUM CHLORIDE 0.9 % IV SOLN: INTRAVENOUS | Status: AC

## 2017-06-22 MED ORDER — PANTOPRAZOLE SODIUM 40 MG PO TBEC
40.0000 mg | DELAYED_RELEASE_TABLET | Freq: Every day | ORAL | Status: DC
  Administered 2017-06-22 – 2017-06-23 (×2): 40 mg via ORAL
  Filled 2017-06-22 (×2): qty 1

## 2017-06-22 MED ORDER — MEMANTINE HCL ER 28 MG PO CP24
28.0000 mg | ORAL_CAPSULE | Freq: Every day | ORAL | Status: DC
  Administered 2017-06-22 – 2017-06-23 (×2): 28 mg via ORAL
  Filled 2017-06-22 (×2): qty 1

## 2017-06-22 MED ORDER — IODIXANOL 320 MG/ML IV SOLN
INTRAVENOUS | Status: DC | PRN
  Administered 2017-06-22: 120 mL via INTRA_ARTERIAL

## 2017-06-22 MED ORDER — MIRABEGRON ER 50 MG PO TB24
50.0000 mg | ORAL_TABLET | Freq: Every day | ORAL | Status: DC
  Administered 2017-06-22 – 2017-06-23 (×2): 50 mg via ORAL
  Filled 2017-06-22 (×2): qty 1

## 2017-06-22 MED ORDER — HYDRALAZINE HCL 20 MG/ML IJ SOLN: 5.0000 mg | INTRAMUSCULAR | Status: DC | PRN

## 2017-06-22 MED ORDER — ENSURE ENLIVE PO LIQD
237.0000 mL | Freq: Two times a day (BID) | ORAL | Status: DC
  Filled 2017-06-22 (×5): qty 237

## 2017-06-22 MED ORDER — MIDAZOLAM HCL 2 MG/2ML IJ SOLN
INTRAMUSCULAR | Status: DC | PRN
  Administered 2017-06-22: 1 mg via INTRAVENOUS

## 2017-06-22 MED ORDER — MIDAZOLAM HCL 2 MG/2ML IJ SOLN
INTRAMUSCULAR | Status: AC
  Filled 2017-06-22: qty 2

## 2017-06-22 MED ORDER — LEVETIRACETAM 250 MG PO TABS
250.0000 mg | ORAL_TABLET | Freq: Two times a day (BID) | ORAL | Status: DC
  Administered 2017-06-22 – 2017-06-23 (×3): 250 mg via ORAL
  Filled 2017-06-22 (×3): qty 1

## 2017-06-22 MED ORDER — CLOPIDOGREL BISULFATE 75 MG PO TABS
75.0000 mg | ORAL_TABLET | Freq: Every day | ORAL | Status: DC
  Administered 2017-06-23: 75 mg via ORAL
  Filled 2017-06-22 (×2): qty 1

## 2017-06-22 MED ORDER — ALBUTEROL SULFATE (2.5 MG/3ML) 0.083% IN NEBU: 3.0000 mL | INHALATION_SOLUTION | Freq: Four times a day (QID) | RESPIRATORY_TRACT | Status: DC | PRN

## 2017-06-22 MED ORDER — ANGIOPLASTY BOOK
Freq: Once | Status: AC
  Administered 2017-06-23: 07:00:00 1
  Filled 2017-06-22: qty 1

## 2017-06-22 MED ORDER — HEPARIN SODIUM (PORCINE) 1000 UNIT/ML IJ SOLN
INTRAMUSCULAR | Status: DC | PRN
  Administered 2017-06-22: 4000 [IU] via INTRAVENOUS

## 2017-06-22 MED ORDER — ALBUTEROL SULFATE (2.5 MG/3ML) 0.083% IN NEBU
2.5000 mg | INHALATION_SOLUTION | Freq: Two times a day (BID) | RESPIRATORY_TRACT | Status: DC
  Administered 2017-06-23: 08:00:00 2.5 mg via RESPIRATORY_TRACT
  Filled 2017-06-22: qty 3

## 2017-06-22 MED ORDER — ATORVASTATIN CALCIUM 40 MG PO TABS
40.0000 mg | ORAL_TABLET | Freq: Every day | ORAL | Status: DC
  Administered 2017-06-22: 40 mg via ORAL
  Filled 2017-06-22: qty 1

## 2017-06-22 MED ORDER — LABETALOL HCL 5 MG/ML IV SOLN: 10.0000 mg | INTRAVENOUS | Status: DC | PRN

## 2017-06-22 MED ORDER — ACETAMINOPHEN 325 MG PO TABS
650.0000 mg | ORAL_TABLET | ORAL | Status: DC | PRN
  Administered 2017-06-22: 23:00:00 650 mg via ORAL
  Filled 2017-06-22: qty 2

## 2017-06-22 MED ORDER — SODIUM CHLORIDE 0.9% FLUSH: 3.0000 mL | Freq: Two times a day (BID) | INTRAVENOUS | Status: DC

## 2017-06-22 MED ORDER — DARIFENACIN HYDROBROMIDE ER 7.5 MG PO TB24
7.5000 mg | ORAL_TABLET | Freq: Every day | ORAL | Status: DC
Start: 2017-06-22 — End: 2017-06-23
  Administered 2017-06-22: 7.5 mg via ORAL
  Filled 2017-06-22 (×2): qty 1

## 2017-06-22 SURGICAL SUPPLY — 21 items
BAG SNAP BAND KOVER 36X36 (MISCELLANEOUS) ×2 IMPLANT
BALLN MUSTANG 6X80X135 (BALLOONS) ×3
BALLOON MUSTANG 6X80X135 (BALLOONS) IMPLANT
CATH OMNI FLUSH 5F 65CM (CATHETERS) ×4 IMPLANT
COVER DOME SNAP 22 D (MISCELLANEOUS) ×1 IMPLANT
COVER PRB 48X5XTLSCP FOLD TPE (BAG) IMPLANT
COVER PROBE 5X48 (BAG) ×3
KIT ENCORE 26 ADVANTAGE (KITS) ×1 IMPLANT
KIT MICROPUNCTURE NIT STIFF (SHEATH) ×1 IMPLANT
KIT PV (KITS) ×3 IMPLANT
SHEATH PINNACLE 5F 10CM (SHEATH) ×1 IMPLANT
SHEATH PINNACLE MP 6F 45CM (SHEATH) ×1 IMPLANT
SHIELD RADPAD SCOOP 12X17 (MISCELLANEOUS) ×1 IMPLANT
STENT ELUVIA 7X80X130 (Permanent Stent) ×1 IMPLANT
SYR MEDRAD MARK V 150ML (SYRINGE) ×3 IMPLANT
TAPE VIPERTRACK RADIOPAQ (MISCELLANEOUS) IMPLANT
TAPE VIPERTRACK RADIOPAQUE (MISCELLANEOUS) ×3
TRANSDUCER W/STOPCOCK (MISCELLANEOUS) ×3 IMPLANT
TRAY PV CATH (CUSTOM PROCEDURE TRAY) ×3 IMPLANT
WIRE BENTSON .035X145CM (WIRE) ×1 IMPLANT
WIRE HI TORQ VERSACORE J 260CM (WIRE) ×1 IMPLANT

## 2017-06-22 NOTE — Interval H&P Note (Signed)
History and Physical Interval Note:  06/22/2017 10:34 AM  Bridget Ball  has presented today for surgery, with the diagnosis of aneursym of left popliteal artery  The various methods of treatment have been discussed with the patient and family. After consideration of risks, benefits and other options for treatment, the patient has consented to  Procedure(s): ABDOMINAL AORTOGRAM W/LOWER EXTREMITY (N/A) as a surgical intervention .  The patient's history has been reviewed, patient examined, no change in status, stable for surgery.  I have reviewed the patient's chart and labs.  Questions were answered to the patient's satisfaction.     [name]

## 2017-06-22 NOTE — Progress Notes (Signed)
Site area: left groin  Site Prior to Removal:  Level 0  Pressure Applied For 20 MINUTES    Minutes Beginning at 1430  Manual:   Yes.    Patient Status During Pull:  Stable   Post Pull Groin Site:  Level 0  Post Pull Instructions Given:  Yes.    Post Pull Pulses Present:  Yes.    Dressing Applied:  Yes.    Comments:  Rechecked during shift with no change

## 2017-06-22 NOTE — Op Note (Signed)
    Patient name: Bridget Ball MRN: [id number] DOB: Aug 05, 1928 Sex: female  06/22/2017 Pre-operative Diagnosis: In-stent stenosis Post-operative diagnosis:  Same Surgeon:  [name] Procedure Performed:  1.  Ultrasound-guided access, left femoral artery  2.  Abdominal aortogram  3.  Right lower extremity runoff  4.  Stent, right popliteal artery  5.  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  She has developed in-stent stenosis by ultrasound.  She is here today for further evaluation.  Procedure:  The patient was identified in the holding area and taken to room 8.  The patient was then placed supine on the table and prepped and draped in the usual sterile fashion.  A time out was called.  Conscious sedation was administered with the use of IV fentanyl and Versed under continuous physician and nurse monitoring.  Heart rate, blood pressure, and oxygen saturations were continuously monitored.  Ultrasound was used to evaluate the left common femoral artery.  It was patent .  A digital ultrasound image was acquired.  A micropuncture needle was used to access the left common femoral artery under ultrasound guidance.  An 018 wire was advanced without resistance and a micropuncture sheath was placed.  The 018 wire was removed and a benson wire was placed.  The micropuncture sheath was exchanged for a 5 french sheath.  An omniflush catheter was advanced over the wire to the level of L-1.  An abdominal angiogram was obtained.  Next, using the omniflush catheter and a benson wire, the aortic bifurcation was crossed and the catheter was placed into theright external iliac artery and right runoff was obtained.    Findings:   Aortogram: Right common femoral profundofemoral artery are widely patent.  Superficial femoral artery is patent down to the adductor canal.  There is a 85% stenosis which is  focal within the above-knee popliteal artery.  Right Lower Extremity: The remaining portion of the popliteal artery is patent.  There is two-vessel runoff via the anterior tibial and peroneal artery.   Intervention: After the above images were acquired the decision was made to proceed with intervention.  Over a versa core wire, a 6 French 45 cm sheath was advanced into the right external iliac artery.  The patient was then fully heparinized.  I then used a versa core wire to cross the lesion in the right popliteal artery.  I selected a 7 x 80 Elluvia and deployed this across the lesion.  It was molded with a 6 mm balloon.  Completion imaging showed resolution of the stenosis with residual disease less than 10%.  At this point catheters and wires were removed.  The sheath was withdrawn to the left external iliac artery and the patient was taken to the holding area for sheath pull once coagulation profile corrects  Impression:  #1 focal 85% stenosis within the above-knee popliteal artery successfully treated using a 7 x 80 Elluvia   V. [name], M.D. [facility] Office: [phone] Pager:  [phone]

## 2017-06-23 ENCOUNTER — Telehealth: Payer: Self-pay | Admitting: Surgery

## 2017-06-23 DIAGNOSIS — Z7982 Long term (current) use of aspirin: Secondary | ICD-10-CM | POA: Diagnosis not present

## 2017-06-23 DIAGNOSIS — Z881 Allergy status to other antibiotic agents status: Secondary | ICD-10-CM | POA: Diagnosis not present

## 2017-06-23 DIAGNOSIS — I724 Aneurysm of artery of lower extremity: Secondary | ICD-10-CM | POA: Diagnosis not present

## 2017-06-23 DIAGNOSIS — N289 Disorder of kidney and ureter, unspecified: Secondary | ICD-10-CM | POA: Diagnosis not present

## 2017-06-23 DIAGNOSIS — Z880 Allergy status to penicillin: Secondary | ICD-10-CM | POA: Diagnosis not present

## 2017-06-23 DIAGNOSIS — Z823 Family history of stroke: Secondary | ICD-10-CM | POA: Diagnosis not present

## 2017-06-23 DIAGNOSIS — I739 Peripheral vascular disease, unspecified: Secondary | ICD-10-CM | POA: Diagnosis not present

## 2017-06-23 DIAGNOSIS — Z9981 Dependence on supplemental oxygen: Secondary | ICD-10-CM | POA: Diagnosis not present

## 2017-06-23 DIAGNOSIS — T82858A Stenosis of vascular prosthetic devices, implants and grafts, initial encounter: Secondary | ICD-10-CM | POA: Diagnosis not present

## 2017-06-23 DIAGNOSIS — Z87891 Personal history of nicotine dependence: Secondary | ICD-10-CM | POA: Diagnosis not present

## 2017-06-23 DIAGNOSIS — Z960 Presence of urogenital implants: Secondary | ICD-10-CM | POA: Diagnosis not present

## 2017-06-23 DIAGNOSIS — Z85828 Personal history of other malignant neoplasm of skin: Secondary | ICD-10-CM | POA: Diagnosis not present

## 2017-06-23 DIAGNOSIS — M1990 Unspecified osteoarthritis, unspecified site: Secondary | ICD-10-CM | POA: Diagnosis not present

## 2017-06-23 DIAGNOSIS — Z8249 Family history of ischemic heart disease and other diseases of the circulatory system: Secondary | ICD-10-CM | POA: Diagnosis not present

## 2017-06-23 DIAGNOSIS — Z936 Other artificial openings of urinary tract status: Secondary | ICD-10-CM | POA: Diagnosis not present

## 2017-06-23 DIAGNOSIS — Z8673 Personal history of transient ischemic attack (TIA), and cerebral infarction without residual deficits: Secondary | ICD-10-CM | POA: Diagnosis not present

## 2017-06-23 DIAGNOSIS — Z79899 Other long term (current) drug therapy: Secondary | ICD-10-CM | POA: Diagnosis not present

## 2017-06-23 DIAGNOSIS — E785 Hyperlipidemia, unspecified: Secondary | ICD-10-CM | POA: Diagnosis not present

## 2017-06-23 DIAGNOSIS — I252 Old myocardial infarction: Secondary | ICD-10-CM | POA: Diagnosis not present

## 2017-06-23 DIAGNOSIS — J449 Chronic obstructive pulmonary disease, unspecified: Secondary | ICD-10-CM | POA: Diagnosis not present

## 2017-06-23 DIAGNOSIS — K219 Gastro-esophageal reflux disease without esophagitis: Secondary | ICD-10-CM | POA: Diagnosis not present

## 2017-06-23 MED ORDER — CLOPIDOGREL BISULFATE 75 MG PO TABS: 75.0000 mg | ORAL_TABLET | Freq: Every day | ORAL | 1 refills | Status: AC

## 2017-06-23 MED FILL — Heparin Sod (Porcine)-NaCl IV Soln 1000 Unit/500ML-0.9%: INTRAVENOUS | Qty: 1000 | Status: AC

## 2017-06-23 NOTE — Telephone Encounter (Signed)
sch appt lvm 08/02/17 2pm LE Art 3pm p/o PA

## 2017-06-23 NOTE — Discharge Summary (Signed)
Physician Discharge Summary   Patient ID: Bridget Ball [id number] y.o. 01-06-1928  Admit date: 06/22/2017  Discharge date and time: 06/23/17   Admitting Physician: Serafina Mitchell, MD   Discharge Physician: same  Admission Diagnoses: PAD (peripheral artery disease) [facility]) [I73.9]  Discharge Diagnoses: same  Admission Condition: fair  Discharged Condition: fair  Indication for Admission: in stent stenosis  Hospital Course: Bridget Ball is a 84 year old female who came in as an outpatient for right lower extremity arteriogram due to in-stent stenosis.  She underwent stenting of the right popliteal artery by Dr. Trula Slade on 06/22/2017.  She tolerated the procedure well and was brought into the hospital for observation overnight.  POD #1 left groin catheterization site is unremarkable and she is feeling fit for discharge home.  She will be prescribed 75 mg of Plavix to take daily.  She will follow-up in office in about 4 weeks with a right lower extremity arterial duplex to assess stent patency.  Discharge instructions were reviewed with the patient and she voices her understanding.  She will be discharged home this morning in stable condition.  Consults: None  Treatments: By Dr. Trula Slade 06/22/17 : RLE angiogram and popliteal artery stenting  Discharge Exam: see progress note 06/23/17 Vitals:   06/23/17 0500 06/23/17 0730  BP: (!) 124/51 104/68  Pulse: 80 95  Resp: (!) 22 (!) 24  Temp: 98 F (36.7 C) 98.1 F (36.7 C)  SpO2: 97% 97%     Disposition: Discharge disposition: 01-Home or Self Care       Patient Instructions:  Allergies as of 06/23/2017      Reactions   Penicillins Anaphylaxis   Throat swelling as a child. Tolerated ceftriaxone 07/29/13.  Has patient had a PCN reaction causing immediate rash, facial/tongue/throat swelling, SOB or lightheadedness with hypotension: YES Has patient had a PCN reaction causing severe rash involving mucus membranes or skin necrosis:  NO Has patient had a PCN reaction that required hospitalization: UNKN Has patient had a PCN reaction occurring within the last 10 years: NO If all of the above answers are "NO", then may proceed with Cephalosporin use      Medication List    TAKE these medications   albuterol 108 (90 Base) MCG/ACT inhaler Commonly known as:  PROVENTIL HFA;VENTOLIN HFA Inhale 2 puffs into the lungs every 6 (six) hours as needed for wheezing or shortness of breath.   albuterol (2.5 MG/3ML) 0.083% nebulizer solution Commonly known as:  PROVENTIL Take 2.5 mg by nebulization 2 (two) times daily.   aspirin 325 MG tablet Take 325 mg by mouth daily.   atorvastatin 80 MG tablet Commonly known as:  LIPITOR Take 40 mg by mouth daily at 6 PM.   budesonide 3 MG 24 hr capsule Commonly known as:  ENTOCORT EC Take 6 mg by mouth daily as needed (for severe diarrhea).   citalopram 10 MG tablet Commonly known as:  CELEXA TAKE ONE (1) TABLET BY MOUTH EVERY DAY   clopidogrel 75 MG tablet Commonly known as:  PLAVIX Take 1 tablet (75 mg total) by mouth daily with breakfast.   docusate sodium 100 MG capsule Commonly known as:  COLACE Take 100 mg by mouth daily.   feeding supplement (ENSURE ENLIVE) Liqd Take 237 mLs by mouth 2 (two) times daily between meals.   ferrous sulfate 325 (65 FE) MG tablet Take 65 mg by mouth daily.   levETIRAcetam 250 MG tablet Commonly known as:  KEPPRA Take 1 tablet (250 mg  total) by mouth 2 (two) times daily.   MYRBETRIQ 50 MG Tb24 tablet Generic drug:  mirabegron ER Take 50 mg by mouth daily.   NAMENDA XR 28 MG Cp24 24 hr capsule Generic drug:  memantine Take 28 mg by mouth daily.   pantoprazole 40 MG tablet Commonly known as:  PROTONIX TAKE ONE TABLET (40MG  TOTAL) BY MOUTH EVERY OTHER DAY   Potassium 99 MG Tabs Take 99 mg by mouth daily.   tamsulosin 0.4 MG Caps capsule Commonly known as:  FLOMAX Take 1 capsule (0.4 mg total) by mouth daily.   TRELEGY  ELLIPTA 100-62.5-25 MCG/INH Aepb Generic drug:  Fluticasone-Umeclidin-Vilant Inhale 1 puff into the lungs daily.   VESICARE 5 MG tablet Generic drug:  solifenacin Take 5 mg by mouth daily.      Activity: activity as tolerated Diet: regular diet Wound Care: none needed  Follow-up with Dr. Trula Slade in 4 weeks.  SignedDagoberto Ligas 06/23/2017 10:15 AM

## 2017-06-23 NOTE — Discharge Instructions (Signed)
° °  [facility] ° °Discharge Instructions ° °Lower Extremity Angiogram; Angioplasty/Stenting ° °Please refer to the following instructions for your post-procedure care. Your surgeon or physician assistant will discuss any changes with you. ° °Activity ° °Avoid lifting more than 8 pounds (1 gallons of milk) for 72 hours (3 days) after your procedure. You may walk as much as you can tolerate. It's OK to drive after 72 hours. ° °Bathing/Showering ° °You may shower the day after your procedure. If you have a bandage, you may remove it at 24- 48 hours. Clean your incision site with mild soap and water. Pat the area dry with a clean towel. ° °Diet ° °Resume your pre-procedure diet. There are no special food restrictions following this procedure. All patients with peripheral vascular disease should follow a low fat/low cholesterol diet. In order to heal from your surgery, it is CRITICAL to get adequate nutrition. Your body requires vitamins, minerals, and protein. Vegetables are the best source of vitamins and minerals. Vegetables also provide the perfect balance of protein. Processed food has little nutritional value, so try to avoid this. ° °Medications ° °Resume taking all of your medications unless your doctor tells you not to. If your incision is causing pain, you may take over-the-counter pain relievers such as acetaminophen (Tylenol) ° °Follow Up ° °Follow up will be arranged at the time of your procedure. You may have an office visit scheduled or may be scheduled for surgery. Ask your surgeon if you have any questions. ° °Please call us immediately for any of the following conditions: °•Severe or worsening pain your legs or feet at rest or with walking. °•Increased pain, redness, drainage at your groin puncture site. °•Fever of 101 degrees or higher. °•If you have any mild or slow bleeding from your puncture site: lie down, apply firm constant pressure over the area with a piece of  gauze or a clean wash cloth for 30 minutes- no peeking!, call 911 right away if you are still bleeding after 30 minutes, or if the bleeding is heavy and unmanageable. ° °Reduce your risk factors of vascular disease: ° °Stop smoking. If you would like help call [facility] at 1-800-QUIT-NOW (1-800-784-8669) or [facility] at 336-586-4000. °Manage your cholesterol °Maintain a desired weight °Control your diabetes °Keep your blood pressure down ° °If you have any questions, please call the office at [phone] ° °

## 2017-06-23 NOTE — Progress Notes (Signed)
    Subjective  - POD #1, s/p right SFA stent  No complaints   Physical Exam:  Palpable right DP pulse Left groin without complications from access       Assessment/Plan:  POD #1  D/C home today with Plavix for at least 3 months  Bridget Ball 06/23/2017 7:59 PM --  Vitals:   06/23/17 0500 06/23/17 0730  BP: (!) 124/51 104/68  Pulse: 80 95  Resp: (!) 22 (!) 24  Temp: 98 F (36.7 C) 98.1 F (36.7 C)  SpO2: 97% 97%    Intake/Output Summary (Last 24 hours) at 06/23/2017 1959 Last data filed at 06/23/2017 1100 Gross per 24 hour  Intake 480 ml  Output 700 ml  Net -220 ml     Laboratory CBC    Component Value Date/Time   WBC 7.7 02/17/2017 0354   HGB 11.6 (L) 06/22/2017 0930   HCT 34.0 (L) 06/22/2017 0930   PLT 275 02/17/2017 0354    BMET    Component Value Date/Time   NA 141 06/22/2017 0930   NA 142 06/24/2015 1425   K 4.1 06/22/2017 0930   CL 103 06/22/2017 0930   CO2 24 02/17/2017 0354   GLUCOSE 82 06/22/2017 0930   BUN 18 06/22/2017 0930   BUN 16 06/24/2015 1425   CREATININE 0.90 06/22/2017 0930   CALCIUM 8.6 (L) 02/17/2017 0354   GFRNONAA >60 02/17/2017 0354   GFRAA >60 02/17/2017 0354    COAG Lab Results  Component Value Date   INR 0.97 02/05/2016   INR 1.66 01/30/2016   INR 1.25 12/13/2015   No results found for: PTT  Antibiotics Anti-infectives (From admission, onward)   None       V. Leia Alf, M.D. [facility] [place] Office: [phone] Pager:  [phone]

## 2017-06-25 ENCOUNTER — Other Ambulatory Visit: Payer: Self-pay

## 2017-06-25 DIAGNOSIS — Z48812 Encounter for surgical aftercare following surgery on the circulatory system: Secondary | ICD-10-CM

## 2017-06-25 DIAGNOSIS — I82431 Acute embolism and thrombosis of right popliteal vein: Secondary | ICD-10-CM

## 2017-06-25 DIAGNOSIS — I724 Aneurysm of artery of lower extremity: Secondary | ICD-10-CM

## 2017-06-28 ENCOUNTER — Encounter: Payer: Medicare Other | Admitting: Surgery

## 2017-07-05 DIAGNOSIS — K5100 Ulcerative (chronic) pancolitis without complications: Secondary | ICD-10-CM | POA: Diagnosis not present

## 2017-07-05 DIAGNOSIS — E782 Mixed hyperlipidemia: Secondary | ICD-10-CM | POA: Diagnosis not present

## 2017-07-05 DIAGNOSIS — D509 Iron deficiency anemia, unspecified: Secondary | ICD-10-CM | POA: Diagnosis not present

## 2017-07-05 NOTE — Patient Instructions (Signed)
Bridget Ball  07/05/2017     @PREFPERIOPPHARMACY @   Your procedure is scheduled on  07/13/2017   Report to [facility] at  1015   A.M.  Call this number if you have problems the morning of surgery:  [phone]   Remember:  Do not eat or drink after midnight.  You may drink clear liquids until  12 midnight 07/12/2017 .  Clear liquids allowed are:                    Water, Juice (non-citric and without pulp), Carbonated beverages, Clear Tea, Black Coffee only, Plain Jell-O only, Gatorade and Plain Popsicles only    Take these medicines the morning of surgery with A SIP OF WATER  Celexa, keppra, namenda XR, mirbegron ER, protonix, flomax, vesicare.    Do not wear jewelry, make-up or nail polish.  Do not wear lotions, powders, or perfumes, or deodorant.  Do not shave 48 hours prior to surgery.  Men may shave face and neck.  Do not bring valuables to the hospital.  [facility] is not responsible for any belongings or valuables.  Contacts, dentures or bridgework may not be worn into surgery.  Leave your suitcase in the car.  After surgery it may be brought to your room.  For patients admitted to the hospital, discharge time will be determined by your treatment team.  Patients discharged the day of surgery will not be allowed to drive home.   Name and phone number of your driver:   family Special instructions:  None  Please read over the following fact sheets that you were given. Anesthesia Post-op Instructions and Care and Recovery After Surgery       Ureteral Stent Implantation Ureteral stent implantation is a procedure to insert (implant) a flexible, soft, plastic tube (stent) into a tube (ureter) that drains urine from the kidneys. The stent supports the ureter while it heals and helps to drain urine from the kidneys. You may have a ureteral stent implanted after having a procedure to remove a blockage from the ureter (ureterolysis or pyeloplasty).You may  also have a stent implanted to open the flow of urine when you have a blockage caused by a kidney stone, tumor, blood clot, or infection. You have two ureters, one on each side of the body. The ureters connect the kidneys to the organ that holds urine until it passes out of the body (bladder). The stent is placed so that one end is in the kidney, and one end is in the bladder. The stent is usually taken out after your ureter has healed. Depending on your condition, you may have a stent for just a few weeks, or you may have a long-term stent that will need to be replaced every few months. Tell a health care provider about:  Any allergies you have.  All medicines you are taking, including vitamins, herbs, eye drops, creams, and over-the-counter medicines.  Any problems you or family members have had with anesthetic medicines.  Any blood disorders you have.  Any surgeries you have had.  Any medical conditions you have.  Whether you are pregnant or may be pregnant. What are the risks? Generally, this is a safe procedure. However, problems may occur, including:  Infection.  Bleeding.  Allergic reactions to medicines.  Damage to other structures or organs. Tearing (perforation) of the ureter is possible.  Movement of the stent  away from where it is placed during surgery (migration).  What happens before the procedure?  Ask your health care provider about: ? Changing or stopping your regular medicines. This is especially important if you are taking diabetes medicines or blood thinners. ? Taking medicines such as aspirin and ibuprofen. These medicines can thin your blood. Do not take these medicines before your procedure if your health care provider instructs you not to.  Follow instructions from your health care provider about eating or drinking restrictions.  Do not drink alcohol and do not use any tobacco products before your procedure, as told by your health care provider.  You  may be given antibiotic medicine to help prevent infection.  Plan to have someone take you home after the procedure.  If you go home right after the procedure, plan to have someone with you for 24 hours. What happens during the procedure?  An IV tube will be inserted into one of your veins.  You will be given a medicine to make you fall asleep (general anesthetic). You may also be given a medicine to help you relax (sedative).  A thin, tube-shaped instrument with a light and tiny camera at the end (cystoscope) will be inserted into your urethra. The urethra is the tube that drains urine from the bladder out of the body. In men, the urethra opens at the end of the penis. In women, the urethra opens in front of the vaginal opening.  The cystoscope will be passed into your bladder.  A thin wire (guide wire) will be passed through your bladder and into your ureter. This is used to guide the stent into your ureter.  The stent will be inserted into your ureter.  The guide wire and the cystoscope will be removed.  A flexible tube (catheter) will be inserted through your urethra so that one end is in your bladder. This helps to drain urine from your bladder. The procedure may vary among hospitals and health care providers. What happens after the procedure?  Your blood pressure, heart rate, breathing rate, and blood oxygen level will be monitored often until the medicines you were given have worn off.  You may continue to receive medicine and fluids through an IV tube.  You may have some soreness or pain in your abdomen and urethra. Medicines will be available to help you.  You will be encouraged to get up and walk around as soon as you can.  You will have a catheter draining your urine.  You will have some blood in your urine.  Do not drive for 24 hours if you received a sedative. This information is not intended to replace advice given to you by your health care provider. Make sure you  discuss any questions you have with your health care provider. Document Released: 12/20/1999 Document Revised: 05/30/2015 Document Reviewed: 07/06/2014 Elsevier Interactive Patient Education  2018 [place].  Ureteral Stent Implantation, Care After Refer to this sheet in the next few weeks. These instructions provide you with information about caring for yourself after your procedure. Your health care provider may also give you more specific instructions. Your treatment has been planned according to current medical practices, but problems sometimes occur. Call your health care provider if you have any problems or questions after your procedure. What can I expect after the procedure? After the procedure, it is common to have:  Nausea.  Mild pain when you urinate. You may feel this pain in your lower back or lower abdomen.  Pain should stop within a few minutes after you urinate. This may last for up to 1 week.  A small amount of blood in your urine for several days.  Follow these instructions at home:  Medicines  Take over-the-counter and prescription medicines only as told by your health care provider.  If you were prescribed an antibiotic medicine, take it as told by your health care provider. Do not stop taking the antibiotic even if you start to feel better.  Do not drive for 24 hours if you received a sedative.  Do not drive or operate heavy machinery while taking prescription pain medicines. Activity  Return to your normal activities as told by your health care provider. Ask your health care provider what activities are safe for you.  Do not lift anything that is heavier than 10 lb (4.5 kg). Follow this limit for 1 week after your procedure, or for as long as told by your health care provider. General instructions  Watch for any blood in your urine. Call your health care provider if the amount of blood in your urine increases.  If you have a catheter: ? Follow instructions  from your health care provider about taking care of your catheter and collection bag. ? Do not take baths, swim, or use a hot tub until your health care provider approves.  Drink enough fluid to keep your urine clear or pale yellow.  Keep all follow-up visits as told by your health care provider. This is important. Contact a health care provider if:  You have pain that gets worse or does not get better with medicine, especially pain when you urinate.  You have difficulty urinating.  You feel nauseous or you vomit repeatedly during a period of more than 2 days after the procedure. Get help right away if:  Your urine is dark red or has blood clots in it.  You are leaking urine (have incontinence).  The end of the stent comes out of your urethra.  You cannot urinate.  You have sudden, sharp, or severe pain in your abdomen or lower back.  You have a fever. This information is not intended to replace advice given to you by your health care provider. Make sure you discuss any questions you have with your health care provider. Document Released: 08/24/2012 Document Revised: 05/30/2015 Document Reviewed: 07/06/2014 Elsevier Interactive Patient Education  [employer].  Cystoscopy Cystoscopy is a procedure that is used to help diagnose and sometimes treat conditions that affect that lower urinary tract. The lower urinary tract includes the bladder and the tube that drains urine from the bladder out of the body (urethra). Cystoscopy is performed with a thin, tube-shaped instrument with a light and camera at the end (cystoscope). The cystoscope may be hard (rigid) or flexible, depending on the goal of the procedure.The cystoscope is inserted through the urethra, into the bladder. Cystoscopy may be recommended if you have:  Urinary tractinfections that keep coming back (recurring).  Blood in the urine (hematuria).  Loss of bladder control (urinary incontinence) or an overactive  bladder.  Unusual cells found in a urine sample.  A blockage in the urethra.  Painful urination.  An abnormality in the bladder found during an intravenous pyelogram (IVP) or CT scan.  Cystoscopy may also be done to remove a sample of tissue to be examined under a microscope (biopsy). Tell a health care provider about:  Any allergies you have.  All medicines you are taking, including vitamins, herbs, eye drops,  creams, and over-the-counter medicines.  Any problems you or family members have had with anesthetic medicines.  Any blood disorders you have.  Any surgeries you have had.  Any medical conditions you have.  Whether you are pregnant or may be pregnant. What are the risks? Generally, this is a safe procedure. However, problems may occur, including:  Infection.  Bleeding.  Allergic reactions to medicines.  Damage to other structures or organs.  What happens before the procedure?  Ask your health care provider about: ? Changing or stopping your regular medicines. This is especially important if you are taking diabetes medicines or blood thinners. ? Taking medicines such as aspirin and ibuprofen. These medicines can thin your blood. Do not take these medicines before your procedure if your health care provider instructs you not to.  Follow instructions from your health care provider about eating or drinking restrictions.  You may be given antibiotic medicine to help prevent infection.  You may have an exam or testing, such as X-rays of the bladder, urethra, or kidneys.  You may have urine tests to check for signs of infection.  Plan to have someone take you home after the procedure. What happens during the procedure?  To reduce your risk of infection,your health care team will wash or sanitize their hands.  You will be given one or more of the following: ? A medicine to help you relax (sedative). ? A medicine to numb the area (local anesthetic).  The  area around the opening of your urethra will be cleaned.  The cystoscope will be passed through your urethra into your bladder.  Germ-free (sterile)fluid will flow through the cystoscope to fill your bladder. The fluid will stretch your bladder so that your surgeon can clearly examine your bladder walls.  The cystoscope will be removed and your bladder will be emptied. The procedure may vary among health care providers and hospitals. What happens after the procedure?  You may have some soreness or pain in your abdomen and urethra. Medicines will be available to help you.  You may have some blood in your urine.  Do not drive for 24 hours if you received a sedative. This information is not intended to replace advice given to you by your health care provider. Make sure you discuss any questions you have with your health care provider. Document Released: 12/20/1999 Document Revised: 05/02/2015 Document Reviewed: 11/08/2014 Elsevier Interactive Patient Education  2018 [employer].  Cystoscopy, Care After Refer to this sheet in the next few weeks. These instructions provide you with information about caring for yourself after your procedure. Your health care provider may also give you more specific instructions. Your treatment has been planned according to current medical practices, but problems sometimes occur. Call your health care provider if you have any problems or questions after your procedure. What can I expect after the procedure? After the procedure, it is common to have:  Mild pain when you urinate. Pain should stop within a few minutes after you urinate. This may last for up to 1 week.  A small amount of blood in your urine for several days.  Feeling like you need to urinate but producing only a small amount of urine.  Follow these instructions at home:  Medicines  Take over-the-counter and prescription medicines only as told by your health care provider.  If you were  prescribed an antibiotic medicine, take it as told by your health care provider. Do not stop taking the antibiotic even if you start  to feel better. General instructions   Return to your normal activities as told by your health care provider. Ask your health care provider what activities are safe for you.  Do not drive for 24 hours if you received a sedative.  Watch for any blood in your urine. If the amount of blood in your urine increases, call your health care provider.  Follow instructions from your health care provider about eating or drinking restrictions.  If a tissue sample was removed for testing (biopsy) during your procedure, it is your responsibility to get your test results. Ask your health care provider or the department performing the test when your results will be ready.  Drink enough fluid to keep your urine clear or pale yellow.  Keep all follow-up visits as told by your health care provider. This is important. Contact a health care provider if:  You have pain that gets worse or does not get better with medicine, especially pain when you urinate.  You have difficulty urinating. Get help right away if:  You have more blood in your urine.  You have blood clots in your urine.  You have abdominal pain.  You have a fever or chills.  You are unable to urinate. This information is not intended to replace advice given to you by your health care provider. Make sure you discuss any questions you have with your health care provider. Document Released: 07/11/2004 Document Revised: 05/30/2015 Document Reviewed: 11/08/2014 Elsevier Interactive Patient Education  2018 [place] Anesthesia, Adult General anesthesia is the use of medicines to make a person "go to sleep" (be unconscious) for a medical procedure. General anesthesia is often recommended when a procedure:  Is long.  Requires you to be still or in an unusual position.  Is major and can cause you  to lose blood.  Is impossible to do without general anesthesia.  The medicines used for general anesthesia are called general anesthetics. In addition to making you sleep, the medicines:  Prevent pain.  Control your blood pressure.  Relax your muscles.  Tell a health care provider about:  Any allergies you have.  All medicines you are taking, including vitamins, herbs, eye drops, creams, and over-the-counter medicines.  Any problems you or family members have had with anesthetic medicines.  Types of anesthetics you have had in the past.  Any bleeding disorders you have.  Any surgeries you have had.  Any medical conditions you have.  Any history of heart or lung conditions, such as heart failure, sleep apnea, or chronic obstructive pulmonary disease (COPD).  Whether you are pregnant or may be pregnant.  Whether you use tobacco, alcohol, marijuana, or street drugs.  Any history of Armed forces logistics/support/administrative officer.  Any history of depression or anxiety. What are the risks? Generally, this is a safe procedure. However, problems may occur, including:  Allergic reaction to anesthetics.  Lung and heart problems.  Inhaling food or liquids from your stomach into your lungs (aspiration).  Injury to nerves.  Waking up during your procedure and being unable to move (rare).  Extreme agitation or a state of mental confusion (delirium) when you wake up from the anesthetic.  Air in the bloodstream, which can lead to stroke.  These problems are more likely to develop if you are having a major surgery or if you have an advanced medical condition. You can prevent some of these complications by answering all of your health care provider's questions thoroughly and by following all pre-procedure instructions. General  anesthesia can cause side effects, including:  Nausea or vomiting  A sore throat from the breathing tube.  Feeling cold or shivery.  Feeling tired, washed out, or  achy.  Sleepiness or drowsiness.  Confusion or agitation.  What happens before the procedure? Staying hydrated Follow instructions from your health care provider about hydration, which may include:  Up to 2 hours before the procedure - you may continue to drink clear liquids, such as water, clear fruit juice, black coffee, and plain tea.  Eating and drinking restrictions Follow instructions from your health care provider about eating and drinking, which may include:  8 hours before the procedure - stop eating heavy meals or foods such as meat, fried foods, or fatty foods.  6 hours before the procedure - stop eating light meals or foods, such as toast or cereal.  6 hours before the procedure - stop drinking milk or drinks that contain milk.  2 hours before the procedure - stop drinking clear liquids.  Medicines  Ask your health care provider about: ? Changing or stopping your regular medicines. This is especially important if you are taking diabetes medicines or blood thinners. ? Taking medicines such as aspirin and ibuprofen. These medicines can thin your blood. Do not take these medicines before your procedure if your health care provider instructs you not to. ? Taking new dietary supplements or medicines. Do not take these during the week before your procedure unless your health care provider approves them.  If you are told to take a medicine or to continue taking a medicine on the day of the procedure, take the medicine with sips of water. General instructions   Ask if you will be going home the same day, the following day, or after a longer hospital stay. ? Plan to have someone take you home. ? Plan to have someone stay with you for the first 24 hours after you leave the hospital or clinic.  For 3-6 weeks before the procedure, try not to use any tobacco products, such as cigarettes, chewing tobacco, and e-cigarettes.  You may brush your teeth on the morning of the  procedure, but make sure to spit out the toothpaste. What happens during the procedure?  You will be given anesthetics through a mask and through an IV tube in one of your veins.  You may receive medicine to help you relax (sedative).  As soon as you are asleep, a breathing tube may be used to help you breathe.  An anesthesia specialist will stay with you throughout the procedure. He or she will help keep you comfortable and safe by continuing to give you medicines and adjusting the amount of medicine that you get. He or she will also watch your blood pressure, pulse, and oxygen levels to make sure that the anesthetics do not cause any problems.  If a breathing tube was used to help you breathe, it will be removed before you wake up. The procedure may vary among health care providers and hospitals. What happens after the procedure?  You will wake up, often slowly, after the procedure is complete, usually in a recovery area.  Your blood pressure, heart rate, breathing rate, and blood oxygen level will be monitored until the medicines you were given have worn off.  You may be given medicine to help you calm down if you feel anxious or agitated.  If you will be going home the same day, your health care provider may check to make sure you can stand,  drink, and urinate.  Your health care providers will treat your pain and side effects before you go home.  Do not drive for 24 hours if you received a sedative.  You may: ? Feel nauseous and vomit. ? Have a sore throat. ? Have mental slowness. ? Feel cold or shivery. ? Feel sleepy. ? Feel tired. ? Feel sore or achy, even in parts of your body where you did not have surgery. This information is not intended to replace advice given to you by your health care provider. Make sure you discuss any questions you have with your health care provider. Document Released: 03/31/2007 Document Revised: 06/04/2015 Document Reviewed: 12/06/2014 Elsevier  Interactive Patient Education  2018 [place] Anesthesia, Adult, Care After These instructions provide you with information about caring for yourself after your procedure. Your health care provider may also give you more specific instructions. Your treatment has been planned according to current medical practices, but problems sometimes occur. Call your health care provider if you have any problems or questions after your procedure. What can I expect after the procedure? After the procedure, it is common to have:  Vomiting.  A sore throat.  Mental slowness.  It is common to feel:  Nauseous.  Cold or shivery.  Sleepy.  Tired.  Sore or achy, even in parts of your body where you did not have surgery.  Follow these instructions at home: For at least 24 hours after the procedure:  Do not: ? Participate in activities where you could fall or become injured. ? Drive. ? Use heavy machinery. ? Drink alcohol. ? Take sleeping pills or medicines that cause drowsiness. ? Make important decisions or sign legal documents. ? Take care of children on your own.  Rest. Eating and drinking  If you vomit, drink water, juice, or soup when you can drink without vomiting.  Drink enough fluid to keep your urine clear or pale yellow.  Make sure you have little or no nausea before eating solid foods.  Follow the diet recommended by your health care provider. General instructions  Have a responsible adult stay with you until you are awake and alert.  Return to your normal activities as told by your health care provider. Ask your health care provider what activities are safe for you.  Take over-the-counter and prescription medicines only as told by your health care provider.  If you smoke, do not smoke without supervision.  Keep all follow-up visits as told by your health care provider. This is important. Contact a health care provider if:  You continue to have nausea or  vomiting at home, and medicines are not helpful.  You cannot drink fluids or start eating again.  You cannot urinate after 8-12 hours.  You develop a skin rash.  You have fever.  You have increasing redness at the site of your procedure. Get help right away if:  You have difficulty breathing.  You have chest pain.  You have unexpected bleeding.  You feel that you are having a life-threatening or urgent problem. This information is not intended to replace advice given to you by your health care provider. Make sure you discuss any questions you have with your health care provider. Document Released: 03/30/2000 Document Revised: 05/27/2015 Document Reviewed: 12/06/2014 Elsevier Interactive Patient Education  [employer].

## 2017-07-07 ENCOUNTER — Encounter (HOSPITAL_COMMUNITY)
Admission: RE | Admit: 2017-07-07 | Discharge: 2017-07-07 | Disposition: A | Discharge status: Home / Self Care | Payer: Medicare Other | Source: Ambulatory Visit | Attending: Urology | Admitting: Urology

## 2017-07-07 ENCOUNTER — Other Ambulatory Visit: Payer: Self-pay

## 2017-07-07 ENCOUNTER — Encounter (HOSPITAL_COMMUNITY): Payer: Self-pay

## 2017-07-07 DIAGNOSIS — Z01812 Encounter for preprocedural laboratory examination: Secondary | ICD-10-CM | POA: Diagnosis not present

## 2017-07-07 HISTORY — DX: Peripheral vascular disease, unspecified: I73.9

## 2017-07-07 LAB — CBC WITH DIFFERENTIAL/PLATELET
BASOS ABS: 0 10*3/uL (ref 0.0–0.1)
BASOS PCT: 0 %
Eosinophils Absolute: 0.3 10*3/uL (ref 0.0–0.7)
Eosinophils Relative: 5 %
HEMATOCRIT: 38.4 % (ref 36.0–46.0)
HEMOGLOBIN: 11.9 g/dL — AB (ref 12.0–15.0)
LYMPHS PCT: 13 %
Lymphs Abs: 0.9 10*3/uL (ref 0.7–4.0)
MCH: 31.5 pg (ref 26.0–34.0)
MCHC: 31 g/dL (ref 30.0–36.0)
MCV: 101.6 fL — AB (ref 78.0–100.0)
MONOS PCT: 8 %
Monocytes Absolute: 0.5 10*3/uL (ref 0.1–1.0)
NEUTROS ABS: 5 10*3/uL (ref 1.7–7.7)
NEUTROS PCT: 74 %
Platelets: 223 10*3/uL (ref 150–400)
RBC: 3.78 MIL/uL — ABNORMAL LOW (ref 3.87–5.11)
RDW: 15.1 % (ref 11.5–15.5)
WBC: 6.7 10*3/uL (ref 4.0–10.5)

## 2017-07-07 LAB — BASIC METABOLIC PANEL
ANION GAP: 7 (ref 5–15)
BUN: 16 mg/dL (ref 8–23)
CHLORIDE: 102 mmol/L (ref 98–111)
CO2: 30 mmol/L (ref 22–32)
Calcium: 9.4 mg/dL (ref 8.9–10.3)
Creatinine, Ser: 0.88 mg/dL (ref 0.44–1.00)
GFR calc non Af Amer: 58 mL/min — ABNORMAL LOW (ref >60)
Glucose, Bld: 133 mg/dL — ABNORMAL HIGH (ref 70–99)
Potassium: 4.2 mmol/L (ref 3.5–5.1)
Sodium: 139 mmol/L (ref 135–145)

## 2017-07-12 DIAGNOSIS — J449 Chronic obstructive pulmonary disease, unspecified: Secondary | ICD-10-CM | POA: Diagnosis not present

## 2017-07-13 ENCOUNTER — Encounter (HOSPITAL_COMMUNITY): Payer: Self-pay | Admitting: *Deleted

## 2017-07-13 ENCOUNTER — Encounter (HOSPITAL_COMMUNITY)
Admission: RE | Disposition: A | Discharge status: Home / Self Care | Payer: Self-pay | Source: Ambulatory Visit | Attending: Urology

## 2017-07-13 ENCOUNTER — Ambulatory Visit (HOSPITAL_COMMUNITY): Payer: Medicare Other

## 2017-07-13 ENCOUNTER — Ambulatory Visit (HOSPITAL_COMMUNITY)
Admission: RE | Admit: 2017-07-13 | Discharge: 2017-07-13 | Disposition: A | Discharge status: Home / Self Care | Payer: Medicare Other | Source: Ambulatory Visit | Attending: Urology | Admitting: Urology

## 2017-07-13 ENCOUNTER — Ambulatory Visit (HOSPITAL_COMMUNITY): Payer: Medicare Other | Admitting: Anesthesiology

## 2017-07-13 DIAGNOSIS — F419 Anxiety disorder, unspecified: Secondary | ICD-10-CM | POA: Insufficient documentation

## 2017-07-13 DIAGNOSIS — F0390 Unspecified dementia without behavioral disturbance: Secondary | ICD-10-CM | POA: Insufficient documentation

## 2017-07-13 DIAGNOSIS — Z880 Allergy status to penicillin: Secondary | ICD-10-CM | POA: Diagnosis not present

## 2017-07-13 DIAGNOSIS — Z9981 Dependence on supplemental oxygen: Secondary | ICD-10-CM | POA: Insufficient documentation

## 2017-07-13 DIAGNOSIS — E785 Hyperlipidemia, unspecified: Secondary | ICD-10-CM | POA: Insufficient documentation

## 2017-07-13 DIAGNOSIS — I252 Old myocardial infarction: Secondary | ICD-10-CM | POA: Diagnosis not present

## 2017-07-13 DIAGNOSIS — F329 Major depressive disorder, single episode, unspecified: Secondary | ICD-10-CM | POA: Diagnosis not present

## 2017-07-13 DIAGNOSIS — N135 Crossing vessel and stricture of ureter without hydronephrosis: Secondary | ICD-10-CM | POA: Diagnosis not present

## 2017-07-13 DIAGNOSIS — Z87891 Personal history of nicotine dependence: Secondary | ICD-10-CM | POA: Insufficient documentation

## 2017-07-13 DIAGNOSIS — K219 Gastro-esophageal reflux disease without esophagitis: Secondary | ICD-10-CM | POA: Insufficient documentation

## 2017-07-13 DIAGNOSIS — Z8673 Personal history of transient ischemic attack (TIA), and cerebral infarction without residual deficits: Secondary | ICD-10-CM | POA: Insufficient documentation

## 2017-07-13 DIAGNOSIS — J449 Chronic obstructive pulmonary disease, unspecified: Secondary | ICD-10-CM | POA: Insufficient documentation

## 2017-07-13 DIAGNOSIS — N1330 Unspecified hydronephrosis: Secondary | ICD-10-CM | POA: Diagnosis not present

## 2017-07-13 DIAGNOSIS — I739 Peripheral vascular disease, unspecified: Secondary | ICD-10-CM | POA: Diagnosis not present

## 2017-07-13 DIAGNOSIS — Z881 Allergy status to other antibiotic agents status: Secondary | ICD-10-CM | POA: Insufficient documentation

## 2017-07-13 DIAGNOSIS — Z85828 Personal history of other malignant neoplasm of skin: Secondary | ICD-10-CM | POA: Diagnosis not present

## 2017-07-13 HISTORY — PX: CYSTOSCOPY W/ URETERAL STENT PLACEMENT: SHX1429

## 2017-07-13 SURGERY — CYSTOSCOPY, FLEXIBLE, WITH STENT REPLACEMENT
Anesthesia: Monitor Anesthesia Care | Laterality: Left

## 2017-07-13 MED ORDER — FENTANYL CITRATE (PF) 100 MCG/2ML IJ SOLN
INTRAMUSCULAR | Status: DC | PRN
  Administered 2017-07-13: 25 ug via INTRAVENOUS

## 2017-07-13 MED ORDER — PROMETHAZINE HCL 25 MG/ML IJ SOLN: 6.2500 mg | INTRAMUSCULAR | Status: DC | PRN

## 2017-07-13 MED ORDER — PHENYLEPHRINE 40 MCG/ML (10ML) SYRINGE FOR IV PUSH (FOR BLOOD PRESSURE SUPPORT)
PREFILLED_SYRINGE | INTRAVENOUS | Status: AC
  Filled 2017-07-13: qty 10

## 2017-07-13 MED ORDER — LIDOCAINE HCL (CARDIAC) PF 100 MG/5ML IV SOSY
PREFILLED_SYRINGE | INTRAVENOUS | Status: DC | PRN
  Administered 2017-07-13: 10 mg via INTRAVENOUS

## 2017-07-13 MED ORDER — CIPROFLOXACIN IN D5W 400 MG/200ML IV SOLN
INTRAVENOUS | Status: AC
  Filled 2017-07-13: qty 200

## 2017-07-13 MED ORDER — FENTANYL CITRATE (PF) 100 MCG/2ML IJ SOLN
INTRAMUSCULAR | Status: AC
  Filled 2017-07-13: qty 2

## 2017-07-13 MED ORDER — PROPOFOL 10 MG/ML IV BOLUS
INTRAVENOUS | Status: AC
  Filled 2017-07-13: qty 20

## 2017-07-13 MED ORDER — PROPOFOL 500 MG/50ML IV EMUL
INTRAVENOUS | Status: DC | PRN
  Administered 2017-07-13: 75 ug/kg/min via INTRAVENOUS

## 2017-07-13 MED ORDER — LACTATED RINGERS IV SOLN
INTRAVENOUS | Status: DC
  Administered 2017-07-13: 11:00:00 via INTRAVENOUS

## 2017-07-13 MED ORDER — LIDOCAINE VISCOUS HCL 2 % MT SOLN
OROMUCOSAL | Status: AC
  Filled 2017-07-13: qty 15

## 2017-07-13 MED ORDER — HYDROMORPHONE HCL 1 MG/ML IJ SOLN: 0.2500 mg | INTRAMUSCULAR | Status: DC | PRN

## 2017-07-13 MED ORDER — LIDOCAINE VISCOUS HCL 2 % MT SOLN
OROMUCOSAL | Status: DC | PRN
  Administered 2017-07-13: 1

## 2017-07-13 MED ORDER — CIPROFLOXACIN IN D5W 400 MG/200ML IV SOLN
INTRAVENOUS | Status: DC | PRN
  Administered 2017-07-13: 400 mg via INTRAVENOUS

## 2017-07-13 MED ORDER — DIATRIZOATE MEGLUMINE 30 % UR SOLN
URETHRAL | Status: AC
  Filled 2017-07-13: qty 100

## 2017-07-13 MED ORDER — HYDROCODONE-ACETAMINOPHEN 7.5-325 MG PO TABS: 1.0000 | ORAL_TABLET | Freq: Once | ORAL | Status: DC | PRN

## 2017-07-13 MED ORDER — PROPOFOL 10 MG/ML IV BOLUS
INTRAVENOUS | Status: DC | PRN
  Administered 2017-07-13: 20 mg via INTRAVENOUS

## 2017-07-13 MED ORDER — WATER FOR IRRIGATION, STERILE IR SOLN
Status: DC | PRN
  Administered 2017-07-13: 500 mL

## 2017-07-13 MED ORDER — STERILE WATER FOR IRRIGATION IR SOLN
Status: DC | PRN
  Administered 2017-07-13: 3000 mL

## 2017-07-13 MED ORDER — MEPERIDINE HCL 50 MG/ML IJ SOLN: 6.2500 mg | INTRAMUSCULAR | Status: DC | PRN

## 2017-07-13 MED ORDER — LACTATED RINGERS IV SOLN: INTRAVENOUS | Status: DC

## 2017-07-13 MED ORDER — DIPHENHYDRAMINE HCL 50 MG/ML IJ SOLN
INTRAMUSCULAR | Status: DC | PRN
  Administered 2017-07-13: 12.5 mg via INTRAVENOUS

## 2017-07-13 MED ORDER — DEXAMETHASONE SODIUM PHOSPHATE 4 MG/ML IJ SOLN
INTRAMUSCULAR | Status: DC | PRN
  Administered 2017-07-13: 4 mg via INTRAVENOUS

## 2017-07-13 SURGICAL SUPPLY — 20 items
BAG DRAIN URO TABLE W/ADPT NS (BAG) ×3 IMPLANT
BAG DRN 8 ADPR NS SKTRN CSTL (BAG) ×1
CLOTH BEACON ORANGE TIMEOUT ST (SAFETY) ×3 IMPLANT
DECANTER SPIKE VIAL GLASS SM (MISCELLANEOUS) ×3 IMPLANT
GLOVE BIOGEL M 7.0 STRL (GLOVE) ×2 IMPLANT
GLOVE BIOGEL M 8.0 STRL (GLOVE) ×3 IMPLANT
GLOVE BIOGEL PI IND STRL 7.0 (GLOVE) ×1 IMPLANT
GLOVE BIOGEL PI INDICATOR 7.0 (GLOVE) ×4
GOWN STRL REUS W/ TWL LRG LVL3 (GOWN DISPOSABLE) ×1 IMPLANT
GOWN STRL REUS W/TWL LRG LVL3 (GOWN DISPOSABLE) ×3
GOWN STRL REUS W/TWL XL LVL3 (GOWN DISPOSABLE) ×3 IMPLANT
GUIDEWIRE STR DUAL SENSOR (WIRE) ×3 IMPLANT
KIT TURNOVER CYSTO (KITS) ×3 IMPLANT
MANIFOLD NEPTUNE II (INSTRUMENTS) ×3 IMPLANT
PACK CYSTO (CUSTOM PROCEDURE TRAY) ×3 IMPLANT
PAD ARMBOARD 7.5X6 YLW CONV (MISCELLANEOUS) ×3 IMPLANT
STENT URET 6FRX24 CONTOUR (STENTS) ×2 IMPLANT
TOWEL OR 17X26 4PK STRL BLUE (TOWEL DISPOSABLE) ×3 IMPLANT
WATER STERILE IRR 3000ML UROMA (IV SOLUTION) ×2 IMPLANT
WATER STERILE IRR 500ML POUR (IV SOLUTION) ×2 IMPLANT

## 2017-07-13 NOTE — Anesthesia Preprocedure Evaluation (Signed)
Anesthesia Evaluation  Patient identified by MRN, date of birth, ID band Patient awake    Reviewed: Allergy & Precautions, H&P , NPO status , Patient's Chart, lab work & pertinent test results, reviewed documented beta blocker date and time   Airway Mallampati: II  TM Distance: >3 FB Neck ROM: full    Dental no notable dental hx. (+) Edentulous Upper, Edentulous Lower   Pulmonary neg pulmonary ROS, shortness of breath and with exertion, COPD,  COPD inhaler, former smoker,    Pulmonary exam normal breath sounds clear to auscultation       Cardiovascular Exercise Tolerance: Good + Past MI and + Peripheral Vascular Disease  negative cardio ROS   Rhythm:regular Rate:Normal     Neuro/Psych Seizures -,  PSYCHIATRIC DISORDERS Anxiety Depression Dementia TIA Neuromuscular disease CVA negative neurological ROS  negative psych ROS   GI/Hepatic negative GI ROS, Neg liver ROS, hiatal hernia, GERD  ,  Endo/Other  negative endocrine ROS  Renal/GU Renal diseasenegative Renal ROS  negative genitourinary   Musculoskeletal   Abdominal   Peds  Hematology negative hematology ROS (+) anemia ,   Anesthesia Other Findings One remaining lowe front tooth  Reproductive/Obstetrics negative OB ROS                             Anesthesia Physical Anesthesia Plan  ASA: IV  Anesthesia Plan: General   Post-op Pain Management:    Induction:   PONV Risk Score and Plan:   Airway Management Planned:   Additional Equipment:   Intra-op Plan:   Post-operative Plan:   Informed Consent: I have reviewed the patients History and Physical, chart, labs and discussed the procedure including the risks, benefits and alternatives for the proposed anesthesia with the patient or authorized representative who has indicated his/her understanding and acceptance.   Dental Advisory Given  Plan Discussed with: CRNA and  Anesthesiologist  Anesthesia Plan Comments:         Anesthesia Quick Evaluation

## 2017-07-13 NOTE — Discharge Instructions (Signed)

## 2017-07-13 NOTE — Interval H&P Note (Signed)
History and Physical Interval Note:  07/13/2017 12:16 PM  Bridget Ball  has presented today for surgery, with the diagnosis of LEFT HYDRONEPHROSIS  The various methods of treatment have been discussed with the patient and family. After consideration of risks, benefits and other options for treatment, the patient has consented to  Procedure(s): CYSTOSCOPY WITH LEFT URETERAL STENT EXCHANGE (Left) as a surgical intervention .  The patient's history has been reviewed, patient examined, no change in status, stable for surgery.  I have reviewed the patient's chart and labs.  Questions were answered to the patient's satisfaction.     Lillette Boxer Nai Dasch

## 2017-07-13 NOTE — Transfer of Care (Signed)
Immediate Anesthesia Transfer of Care Note  Patient: Bridget Ball  Procedure(s) Performed: CYSTOSCOPY WITH LEFT URETERAL STENT EXCHANGE (Left )  Patient Location: PACU  Anesthesia Type:MAC  Level of Consciousness: awake, alert , oriented and patient cooperative  Airway & Oxygen Therapy: Patient Spontanous Breathing and Patient connected to nasal cannula oxygen  Post-op Assessment: Report given to RN and Post -op Vital signs reviewed and stable  Post vital signs: Reviewed and stable  Last Vitals:  Vitals Value Taken Time  BP 95/65 07/13/2017 12:50 PM  Temp 36.5 C 07/13/2017 12:50 PM  Pulse 79 07/13/2017  1:00 PM  Resp 18 07/13/2017  1:00 PM  SpO2 95 % 07/13/2017  1:00 PM  Vitals shown include unvalidated device data.  Last Pain:  Vitals:   07/13/17 1250  TempSrc:   PainSc: 0-No pain      Patients Stated Pain Goal: 5 (46/65/99 3570)  Complications: adverse drug reaction please note red nonraised rash following the vein of the piv MD Cameransi made aware of itching of patient and redness postop patient vss and monitoring

## 2017-07-13 NOTE — H&P (Signed)
H&P  Chief Complaint: Blocked left kidney  History of Present Illness: Bridget Ball is a 84 y.o. year old female who presents for cystoscopy and left J2 stent exchange for management of a left UPJ obstuctionj. She initially presented with left pyonephrosis in 2015 and underwent emergent stenting. She has had annual stent changes since that time.  Past Medical History:  Diagnosis Date  . Anxiety   . Arthritis    "in my back" (06/22/2017)  . Attention to nephrostomy ([place])    PT HAS NEPHROSTOMY TUBE IN PLACE  . Bruises easily   . Chronic diarrhea   . Colitis   . COPD (chronic obstructive pulmonary disease) ([place])   . Dementia   . Depression   . GERD (gastroesophageal reflux disease)   . Hiatal hernia   . Hx of bronchitis   . Hx of pulmonary edema JULY 2015  . Hx of pulmonary edema JULY 2015  . Hx of septic shock JULY 2015  . Hydronephrosis, left   . Hyperlipidemia   . Memory difficulties   . Microscopic colitis   . Myocardial infarction [facility])    unknown time   . On home oxygen therapy    "only at night" (06/22/2017)  . Peripheral vascular disease ([place])   . Renal insufficiency   . Seizures ([place])    last seizure was 4-5 years ago with "brain bleed"  . Shortness of breath    with exertion  . Skin cancer    "LE"  . Stroke [facility])    TIA four years ago / stroke NOV 2014    Past Surgical History:  Procedure Laterality Date  . ABDOMINAL AORTOGRAM W/LOWER EXTREMITY N/A 06/22/2017   Procedure: ABDOMINAL AORTOGRAM W/LOWER EXTREMITY;  Surgeon: Serafina Mitchell, MD;  Location: [place] [facility];  Service: Cardiovascular;  Laterality: N/A;  . APPENDECTOMY    . BREAST BIOPSY Right 02/02/2014   Procedure: RIGHT BREAST BIOPSY;  Surgeon: Jamesetta So, MD;  Location: [facility];  Service: General;  Laterality: Right;  . COLONOSCOPY N/A 05/24/2013   Procedure: COLONOSCOPY;  Surgeon: Rogene Houston, MD;  Location[facility];  Service: Endoscopy;  Laterality: N/A;  100  . CYSTOSCOPY W/  URETERAL STENT PLACEMENT Left 08/06/2015   Procedure: CYSTOSCOPY WITH LEFT URETERAL STENT EXCHANGE - Sammie Bench;  Surgeon: Franchot Gallo, MD;  Location: [facility];  Service: Urology;  Laterality: Left;  . CYSTOSCOPY W/ URETERAL STENT PLACEMENT Left 07/07/2016   Procedure: CYSTOSCOPY, LEFT RETROGRADE PYELOGRAM WITH LEFT URETERAL STENT REPLACEMENT;  Surgeon: Franchot Gallo, MD;  Location: [facility];  Service: Urology;  Laterality: Left;  . CYSTOSCOPY WITH RETROGRADE PYELOGRAM, URETEROSCOPY AND STENT PLACEMENT Left 09/18/2013   Procedure: CYSTOSCOPY WITH RETROGRADE PYELOGRAM,  URETEROSCOPY AND  STENT PLACEMENT, removal of nephrostomy tube;  Surgeon: Jorja Loa, MD;  Location: [facility];  Service: Urology;  Laterality: Left;  . CYSTOSCOPY WITH STENT PLACEMENT Left 09/04/2014   Procedure: CYSTOSCOPY WITH LEFT J2 STENT EXCHANGE;  Surgeon: Franchot Gallo, MD;  Location: [facility];  Service: Urology;  Laterality: Left;  . ENDOVASCULAR REPAIR OF POPLITEAL ARTERY ANEURYSM  02/05/2016  . ESOPHAGOGASTRODUODENOSCOPY N/A 05/24/2013   Procedure: ESOPHAGOGASTRODUODENOSCOPY (EGD);  Surgeon: Rogene Houston, MD;  [facility];  Service: Endoscopy;  Laterality: N/A;  . FALSE ANEURYSM REPAIR Right 02/05/2016   Procedure: REPAIR RIGHT POLITEAL  ANEURYSM;  Surgeon: Serafina Mitchell, MD;  Location: [place];  Service: Vascular;  Laterality: Right;  . KNEE ARTHROSCOPY Right   .  PERIPHERAL VASCULAR INTERVENTION Right 06/22/2017   Procedure: PERIPHERAL VASCULAR INTERVENTION;  Surgeon: Serafina Mitchell, MD;  Location: [place] [facility];  Service: Cardiovascular;  Laterality: Right;  . SKIN CANCER EXCISION     "LE"  . TONSILLECTOMY    . TUBAL LIGATION      Home Medications:  No medications prior to admission.    Allergies:  Allergies  Allergen Reactions  . Penicillins Anaphylaxis    Throat swelling as a child. Tolerated ceftriaxone 07/29/13.  Has patient had a PCN reaction causing immediate rash,  facial/tongue/throat swelling, SOB or lightheadedness with hypotension: YES Has patient had a PCN reaction causing severe rash involving mucus membranes or skin necrosis: NO Has patient had a PCN reaction that required hospitalization: Cira Servant Has patient had a PCN reaction occurring within the last 10 years: NO If all of the above answers are "NO", then may proceed with Cephalosporin use    Family History  Problem Relation Age of Onset  . Diabetes Mother   . Stroke Mother   . Heart attack Father     Social History:  reports that she quit smoking about 19 years ago. Her smoking use included cigarettes. She has a 50.00 pack-year smoking history. She has never used smokeless tobacco. She reports that she drinks about 1.2 oz of alcohol per week. She reports that she does not use drugs.  ROS: A complete review of systems was performed.  All systems are negative except for pertinent findings as noted.  Physical Exam:  Vital signs in last 24 hours:   General:  Alert and oriented, No acute distress HEENT: Normocephalic, atraumatic Neck: No JVD or lymphadenopathy Cardiovascular: Regular  Lungs: uses accessory muscles Abdomen: Soft, nontender, nondistended, no abdominal masses Back: No CVA tenderness Extremities: No edema Neurologic: Grossly intact  Laboratory Data:  No results found for this or any previous visit (from the past 24 hour(s)). No results found for this or any previous visit (from the past 240 hour(s)). Creatinine: Recent Labs    07/07/17 1405  CREATININE 0.88    Radiologic Imaging: No results found.  Impression/Assessment:  Left UPJ obstruction  Plan:  Cysto, left J2 stent exchange  Lillette Boxer Chudney Scheffler 07/13/2017, 8:08 AM  Lillette Boxer. Tyffany Waldrop MD

## 2017-07-13 NOTE — Anesthesia Postprocedure Evaluation (Signed)
Anesthesia Post Note  Patient: Bridget Ball  Procedure(s) Performed: CYSTOSCOPY WITH LEFT URETERAL STENT EXCHANGE (Left )  Patient location during evaluation: PACU Anesthesia Type: General Level of consciousness: awake and alert and patient cooperative Pain management: satisfactory to patient Vital Signs Assessment: post-procedure vital signs reviewed and stable Respiratory status: spontaneous breathing Postop Assessment: no apparent nausea or vomiting Anesthetic complications: no Comments: Redness resolving along IV site left hand. Patient denies itching or other complaints.     Last Vitals:  Vitals:   07/13/17 1300 07/13/17 1315  BP: 109/75 (!) 69/43  Pulse: 79 89  Resp: 18 17  Temp:    SpO2: 95% 96%    Last Pain:  Vitals:   07/13/17 1250  TempSrc:   PainSc: 0-No pain                 Chasen Mendell

## 2017-07-13 NOTE — Addendum Note (Signed)
Addendum  created 07/13/17 1749 by Vista Deck, CRNA   SmartForm saved

## 2017-07-13 NOTE — Op Note (Signed)
Preoperative diagnosis: Left UPJ obstruction with long-term stenting  Postoperative diagnosis: Same  Principal procedure: Cystoscopy, exchange of left double-J stent--24 cm x 6 [place] without tether  Surgeon: Arieal Cuoco  Anesthesia: MAC  Complications: None  Specimen: None  Drains: None  Indications: 84 year old female presenting 4 years ago with pyelonephrosis secondary to UPJ obstruction.  She was treated with urgent stenting.  Stent has been changed on a regular basis since that time due to her medical status.  She presents at this time for routine stent change.  Description of procedure: The patient was properly identified and marked in the holding area.  She was taken to the operating room where MAC was administered.  She was placed in the dorsolithotomy position.  She received 400 mg of Cipro IV.  Proper timeout was performed.  49 French panendoscope advanced into her bladder which was circumferentially inspected.  No abnormalities were seen other than an encrusted stent curl.  This was grasped and brought through her urethra.  It could not be cannulated with a sensor tip guidewire so I totally remove the stent.  Following this, the sensor tip guidewire was advanced under fluoroscopic guidance into the left kidney where a curl was seen.  I then deployed a 24 cm x 6 [place] contour double-J stent over top of the guidewire with good proximal and distal curl seen once a guidewire was removed.  The bladder was then drained and the procedure terminated.  She was awakened and taken to the PACU in stable condition.

## 2017-07-14 ENCOUNTER — Encounter (HOSPITAL_COMMUNITY): Payer: Self-pay | Admitting: Urology

## 2017-07-19 DIAGNOSIS — R269 Unspecified abnormalities of gait and mobility: Secondary | ICD-10-CM | POA: Diagnosis not present

## 2017-07-19 DIAGNOSIS — J449 Chronic obstructive pulmonary disease, unspecified: Secondary | ICD-10-CM | POA: Diagnosis not present

## 2017-07-27 DIAGNOSIS — L819 Disorder of pigmentation, unspecified: Secondary | ICD-10-CM | POA: Diagnosis not present

## 2017-07-27 DIAGNOSIS — L539 Erythematous condition, unspecified: Secondary | ICD-10-CM | POA: Diagnosis not present

## 2017-08-02 ENCOUNTER — Encounter: Payer: Medicare Other | Admitting: Physician Assistant

## 2017-08-02 ENCOUNTER — Encounter (HOSPITAL_COMMUNITY): Payer: Medicare Other

## 2017-08-12 DIAGNOSIS — J449 Chronic obstructive pulmonary disease, unspecified: Secondary | ICD-10-CM | POA: Diagnosis not present

## 2017-08-19 DIAGNOSIS — J449 Chronic obstructive pulmonary disease, unspecified: Secondary | ICD-10-CM | POA: Diagnosis not present

## 2017-08-19 DIAGNOSIS — R269 Unspecified abnormalities of gait and mobility: Secondary | ICD-10-CM | POA: Diagnosis not present

## 2017-09-07 ENCOUNTER — Encounter: Payer: Self-pay | Admitting: Adult Health

## 2017-09-07 ENCOUNTER — Ambulatory Visit: Payer: Medicare Other | Admitting: Adult Health

## 2017-09-07 VITALS — BP 110/72 | HR 76 | Ht 61.0 in | Wt 91.0 lb

## 2017-09-07 DIAGNOSIS — R413 Other amnesia: Secondary | ICD-10-CM

## 2017-09-07 NOTE — Patient Instructions (Signed)
Your Plan:  Continue Namenda If your symptoms worsen or you develop new symptoms please let us know.   Thank you for coming to see us at [facility]. I hope we have been able to provide you high quality care today.  You may receive a patient satisfaction survey over the next few weeks. We would appreciate your feedback and comments so that we may continue to improve ourselves and the health of our patients.  

## 2017-09-07 NOTE — Progress Notes (Signed)
PATIENT: Bridget Ball DOB: Dec 20, 1928  REASON FOR VISIT: follow up HISTORY FROM: patient  HISTORY OF PRESENT ILLNESS: Today 09/07/17:  Bridget Ball is an 84 year old history of memory disturbance.  She returns today for follow-up.  She remains on Namenda.  She lives with her daughter.  She is able to complete all ADLs independently.  The patient does not operate a motor vehicle.  She reports that her daughter took her keys away.  The daughter reports that she got lost coming home one night and had to stop and asked for help.  After that occasion a took her keys from her.  Her daughter states that she takes her anywhere she needs to go.  Patient sleeps well.  She continues to work on her appetite.  Denies any changes in mood or behavior.  She returns today for an evaluation.  HISTORY 03/31/2016 : She returns for follow-up after last visit to 6 months ago. He is recumbent in by daughter. She is tolerated Namenda 10 mg twice daily without side effects. Dr. Oneida Alar memory difficulties about the same and at times she feels she may be slightly worse. Patient has had a few episodes of transient left face and hand numbness lasting about 10 minutes. She had 2 or 3 of these episodes several months ago but not recently. She remains on Xarelto which is taking every day. She states her blood pressure is well controlled. She had lipid profile checked by primary physician but cannot to me when but it was apparently satisfactory. She's had no recent seizures. She remains on Keppra 500 twice daily and started eating well without any side effects.  REVIEW OF SYSTEMS: Out of a complete 14 system review of symptoms, the patient complains only of the following symptoms, and all other reviewed systems are negative.  See HPI  ALLERGIES: Allergies  Allergen Reactions  . Penicillins Anaphylaxis    Throat swelling as a child. Tolerated ceftriaxone 07/29/13.  Has patient had a PCN reaction causing immediate rash,  facial/tongue/throat swelling, SOB or lightheadedness with hypotension: YES Has patient had a PCN reaction causing severe rash involving mucus membranes or skin necrosis: NO Has patient had a PCN reaction that required hospitalization: Cira Servant Has patient had a PCN reaction occurring within the last 10 years: NO If all of the above answers are "NO", then may proceed with Cephalosporin use    HOME MEDICATIONS: Outpatient Medications Prior to Visit  Medication Sig Dispense Refill  . albuterol (PROVENTIL HFA;VENTOLIN HFA) 108 (90 BASE) MCG/ACT inhaler Inhale 2 puffs into the lungs every 6 (six) hours as needed for wheezing or shortness of breath. 1 Inhaler 0  . albuterol (PROVENTIL) (2.5 MG/3ML) 0.083% nebulizer solution Take 2.5 mg by nebulization 2 (two) times daily.     Marland Kitchen aspirin 325 MG tablet Take 325 mg by mouth daily.    Marland Kitchen atorvastatin (LIPITOR) 80 MG tablet Take 80 mg by mouth daily at 6 PM.     . budesonide (ENTOCORT EC) 3 MG 24 hr capsule Take 6 mg by mouth daily as needed (for severe diarrhea).    . citalopram (CELEXA) 10 MG tablet TAKE ONE (1) TABLET BY MOUTH EVERY DAY 90 tablet 3  . clopidogrel (PLAVIX) 75 MG tablet Take 1 tablet (75 mg total) by mouth daily with breakfast. 90 tablet 1  . docusate sodium (COLACE) 100 MG capsule Take 100 mg by mouth daily.     . feeding supplement, ENSURE ENLIVE, (ENSURE ENLIVE) LIQD Take 237 mLs  by mouth 2 (two) times daily between meals. 237 mL 12  . ferrous sulfate 325 (65 FE) MG tablet Take 65 mg by mouth daily.     Marland Kitchen levETIRAcetam (KEPPRA) 250 MG tablet Take 1 tablet (250 mg total) by mouth 2 (two) times daily. 180 tablet 4  . memantine (NAMENDA XR) 28 MG CP24 24 hr capsule Take 28 mg by mouth daily.    . pantoprazole (PROTONIX) 40 MG tablet TAKE ONE TABLET (40MG  TOTAL) BY MOUTH EVERY OTHER DAY 45 tablet 5  . Potassium 99 MG TABS Take 99 mg by mouth daily.    . TRELEGY ELLIPTA 100-62.5-25 MCG/INH AEPB Inhale 1 puff into the lungs daily.     .  VESICARE 5 MG tablet Take 5 mg by mouth daily.     . mirabegron ER (MYRBETRIQ) 50 MG TB24 tablet Take 50 mg by mouth daily.    . tamsulosin (FLOMAX) 0.4 MG CAPS capsule Take 1 capsule (0.4 mg total) by mouth daily. 30 capsule 2   No facility-administered medications prior to visit.     PAST MEDICAL HISTORY: Past Medical History:  Diagnosis Date  . Anxiety   . Arthritis    "in my back" (06/22/2017)  . Attention to nephrostomy ([place])    PT HAS NEPHROSTOMY TUBE IN PLACE  . Bruises easily   . Chronic diarrhea   . Colitis   . COPD (chronic obstructive pulmonary disease) ([place])   . Dementia   . Depression   . GERD (gastroesophageal reflux disease)   . Hiatal hernia   . Hx of bronchitis   . Hx of pulmonary edema JULY 2015  . Hx of pulmonary edema JULY 2015  . Hx of septic shock JULY 2015  . Hydronephrosis, left   . Hyperlipidemia   . Memory difficulties   . Microscopic colitis   . Myocardial infarction [facility])    unknown time   . On home oxygen therapy    "only at night" (06/22/2017)  . Peripheral vascular disease ([place])   . Renal insufficiency   . Seizures ([place])    last seizure was 4-5 years ago with "brain bleed"  . Shortness of breath    with exertion  . Skin cancer    "LE"  . Stroke [facility])    TIA four years ago / stroke NOV 2014    PAST SURGICAL HISTORY: Past Surgical History:  Procedure Laterality Date  . ABDOMINAL AORTOGRAM W/LOWER EXTREMITY N/A 06/22/2017   Procedure: ABDOMINAL AORTOGRAM W/LOWER EXTREMITY;  Surgeon: Serafina Mitchell, MD;  Location: [place] [facility];  Service: Cardiovascular;  Laterality: N/A;  . APPENDECTOMY    . BREAST BIOPSY Right 02/02/2014   Procedure: RIGHT BREAST BIOPSY;  Surgeon: Jamesetta So, MD;  Location: [facility];  Service: General;  Laterality: Right;  . COLONOSCOPY N/A 05/24/2013   Procedure: COLONOSCOPY;  Surgeon: Rogene Houston, MD;  [facility];  Service: Endoscopy;  Laterality: N/A;  100  . CYSTOSCOPY W/ URETERAL STENT  PLACEMENT Left 08/06/2015   Procedure: CYSTOSCOPY WITH LEFT URETERAL STENT EXCHANGE - Sammie Bench;  Surgeon: Franchot Gallo, MD;  Location: [facility];  Service: Urology;  Laterality: Left;  . CYSTOSCOPY W/ URETERAL STENT PLACEMENT Left 07/07/2016   Procedure: CYSTOSCOPY, LEFT RETROGRADE PYELOGRAM WITH LEFT URETERAL STENT REPLACEMENT;  Surgeon: Franchot Gallo, MD;  Location: [facility];  Service: Urology;  Laterality: Left;  . CYSTOSCOPY W/ URETERAL STENT PLACEMENT Left 07/13/2017   Procedure: CYSTOSCOPY WITH LEFT URETERAL STENT EXCHANGE;  Surgeon: Franchot Gallo, MD;  Location: [facility];  Service: Urology;  Laterality: Left;  . CYSTOSCOPY WITH RETROGRADE PYELOGRAM, URETEROSCOPY AND STENT PLACEMENT Left 09/18/2013   Procedure: CYSTOSCOPY WITH RETROGRADE PYELOGRAM,  URETEROSCOPY AND  STENT PLACEMENT, removal of nephrostomy tube;  Surgeon: Jorja Loa, MD;  Location: [facility];  Service: Urology;  Laterality: Left;  . CYSTOSCOPY WITH STENT PLACEMENT Left 09/04/2014   Procedure: CYSTOSCOPY WITH LEFT J2 STENT EXCHANGE;  Surgeon: Franchot Gallo, MD;  Location: [facility];  Service: Urology;  Laterality: Left;  . ENDOVASCULAR REPAIR OF POPLITEAL ARTERY ANEURYSM  02/05/2016  . ESOPHAGOGASTRODUODENOSCOPY N/A 05/24/2013   Procedure: ESOPHAGOGASTRODUODENOSCOPY (EGD);  Surgeon: Rogene Houston, MD;  [facility];  Service: Endoscopy;  Laterality: N/A;  . FALSE ANEURYSM REPAIR Right 02/05/2016   Procedure: REPAIR RIGHT POLITEAL  ANEURYSM;  Surgeon: Serafina Mitchell, MD;  Location: [place];  Service: Vascular;  Laterality: Right;  . KNEE ARTHROSCOPY Right   . PERIPHERAL VASCULAR INTERVENTION Right 06/22/2017   Procedure: PERIPHERAL VASCULAR INTERVENTION;  Surgeon: Serafina Mitchell, MD;  Location: [place] [facility];  Service: Cardiovascular;  Laterality: Right;  . SKIN CANCER EXCISION     "LE"  . TONSILLECTOMY    . TUBAL LIGATION      FAMILY HISTORY: Family History  Problem Relation Age of Onset  .  Diabetes Mother   . Stroke Mother   . Heart attack Father     SOCIAL HISTORY: Social History   Socioeconomic History  . Marital status: Widowed    Spouse name: Not on file  . Number of children: 6  . Years of education: college  . Highest education level: Not on file  Occupational History  . Occupation: retired  Scientific laboratory technician  . Financial resource strain: Not on file  . Food insecurity:    Worry: Not on file    Inability: Not on file  . Transportation needs:    Medical: Not on file    Non-medical: Not on file  Tobacco Use  . Smoking status: Former Smoker    Packs/day: 1.00    Years: 50.00    Pack years: 50.00    Types: Cigarettes    Last attempt to quit: 11/13/1997    Years since quitting: 19.8  . Smokeless tobacco: Never Used  Substance and Sexual Activity  . Alcohol use: Yes    Alcohol/week: 2.0 standard drinks    Types: 2 Glasses of wine per week    Comment: 06/22/2017 "2 glasses a night wine; average"  . Drug use: Never  . Sexual activity: Not Currently  Lifestyle  . Physical activity:    Days per week: Not on file    Minutes per session: Not on file  . Stress: Not on file  Relationships  . Social connections:    Talks on phone: Not on file    Gets together: Not on file    Attends religious service: Not on file    Active member of club or organization: Not on file    Attends meetings of clubs or organizations: Not on file    Relationship status: Not on file  . Intimate partner violence:    Fear of current or ex partner: Not on file    Emotionally abused: Not on file    Physically abused: Not on file    Forced sexual activity: Not on file  Other Topics Concern  . Not on file  Social History Narrative   Patient is widowed with  6 children.   Patient is right handed.   Patient has college education.   Patient drinks 4 cups daily.      PHYSICAL EXAM  Vitals:   09/07/17 1418  BP: 110/72  Pulse: 76  Weight: 91 lb (41.3 kg)  Height: 5\' 1"  (1.549 m)    Body mass index is 17.19 kg/m.   MMSE - Mini Mental State Exam 09/07/2017 10/20/2016 03/31/2016  Orientation to time 2 5 2   Orientation to Place 5 4 5   Registration 3 3 3   Attention/ Calculation 2 4 5   Recall 0 1 0  Language- name 2 objects 2 2 2   Language- repeat 0 1 1  Language- follow 3 step command 3 3 3   Language- read & follow direction 1 1 1   Write a sentence 1 1 1   Copy design 0 0 0  Total score 19 25 23      Generalized: Well developed, in no acute distress   Neurological examination  Mentation: Alert. Follows all commands speech and language fluent Cranial nerve II-XII: Pupils were equal round reactive to light. Extraocular movements were full, visual field were full on confrontational test. Facial sensation and strength were normal. Uvula tongue midline. Head turning and shoulder shrug  were normal and symmetric. Motor: The motor testing reveals 5 over 5 strength of all 4 extremities. Good symmetric motor tone is noted throughout.  Sensory: Sensory testing is intact to soft touch on all 4 extremities. No evidence of extinction is noted.  Coordination: Cerebellar testing reveals good finger-nose-finger and heel-to-shin bilaterally.  Gait and station: Gait is normal.   Reflexes: Deep tendon reflexes are symmetric and normal bilaterally.   DIAGNOSTIC DATA (LABS, IMAGING, TESTING) - I reviewed patient records, labs, notes, testing and imaging myself where available.  Lab Results  Component Value Date   WBC 6.7 07/07/2017   HGB 11.9 (L) 07/07/2017   HCT 38.4 07/07/2017   MCV 101.6 (H) 07/07/2017   PLT 223 07/07/2017      Component Value Date/Time   NA 139 07/07/2017 1405   NA 142 06/24/2015 1425   K 4.2 07/07/2017 1405   CL 102 07/07/2017 1405   CO2 30 07/07/2017 1405   GLUCOSE 133 (H) 07/07/2017 1405   BUN 16 07/07/2017 1405   BUN 16 06/24/2015 1425   CREATININE 0.88 07/07/2017 1405   CALCIUM 9.4 07/07/2017 1405   PROT 5.6 (L) 10/13/2016 0322   PROT 6.2  06/24/2015 1425   ALBUMIN 3.4 (L) 10/13/2016 0322   ALBUMIN 4.0 06/24/2015 1425   AST 31 10/13/2016 0322   ALT 27 10/13/2016 0322   ALKPHOS 44 10/13/2016 0322   BILITOT 0.7 10/13/2016 0322   BILITOT 0.3 06/24/2015 1425   GFRNONAA 58 (L) 07/07/2017 1405   GFRAA >60 07/07/2017 1405   Lab Results  Component Value Date   CHOL 147 10/13/2016   HDL 75 10/13/2016   LDLCALC 59 10/13/2016   TRIG 66 10/13/2016   CHOLHDL 2.0 10/13/2016   Lab Results  Component Value Date   HGBA1C 5.5 10/13/2016   No results found for: VITAMINB12 Lab Results  Component Value Date   TSH 0.984 10/13/2016      ASSESSMENT AND PLAN 84 y.o. year old female  has a past medical history of Anxiety, Arthritis, Attention to nephrostomy ([place]), Bruises easily, Chronic diarrhea, Colitis, COPD (chronic obstructive pulmonary disease) ([place]), Dementia, Depression, GERD (gastroesophageal reflux disease), Hiatal hernia, bronchitis, pulmonary edema (JULY 2015), pulmonary edema (JULY 2015), septic shock (JULY 2015),  Hydronephrosis, left, Hyperlipidemia, Memory difficulties, Microscopic colitis, Myocardial infarction ([place]), On home oxygen therapy, Peripheral vascular disease ([place]), Renal insufficiency, Seizures ([place]), Shortness of breath, Skin cancer, and Stroke ([place]). here with:  1.  Memory disturbance  The patient's memory score has declined slightly since the last visit.  We will continue to monitor.  She will remain on Namenda.  I have advised that her driving should be scrutinized.  If her symptoms worsen or she develops new symptoms she should let us know.  She will follow-up in 6 months or sooner if needed.  I spent 15 minutes with the patient. 50% of this time was spent reviewing her memory score and plan of care   Ward Givens, MSN, NP-C 09/07/2017, 2:34 PM [facility] [address], [place], [place] [address] [phone]

## 2017-09-12 DIAGNOSIS — J449 Chronic obstructive pulmonary disease, unspecified: Secondary | ICD-10-CM | POA: Diagnosis not present

## 2017-09-19 DIAGNOSIS — R269 Unspecified abnormalities of gait and mobility: Secondary | ICD-10-CM | POA: Diagnosis not present

## 2017-09-19 DIAGNOSIS — J449 Chronic obstructive pulmonary disease, unspecified: Secondary | ICD-10-CM | POA: Diagnosis not present

## 2017-09-24 ENCOUNTER — Observation Stay (HOSPITAL_COMMUNITY)
Admission: EM | Admit: 2017-09-24 | Discharge: 2017-09-25 | Disposition: A | Discharge status: Home / Self Care | Payer: Medicare Other | Attending: Family Medicine | Admitting: Family Medicine

## 2017-09-24 ENCOUNTER — Other Ambulatory Visit: Payer: Self-pay

## 2017-09-24 ENCOUNTER — Emergency Department (HOSPITAL_COMMUNITY): Payer: Medicare Other

## 2017-09-24 ENCOUNTER — Encounter (HOSPITAL_COMMUNITY): Payer: Self-pay

## 2017-09-24 DIAGNOSIS — Z79899 Other long term (current) drug therapy: Secondary | ICD-10-CM | POA: Diagnosis not present

## 2017-09-24 DIAGNOSIS — Z7982 Long term (current) use of aspirin: Secondary | ICD-10-CM | POA: Insufficient documentation

## 2017-09-24 DIAGNOSIS — G459 Transient cerebral ischemic attack, unspecified: Secondary | ICD-10-CM | POA: Diagnosis not present

## 2017-09-24 DIAGNOSIS — R202 Paresthesia of skin: Secondary | ICD-10-CM

## 2017-09-24 DIAGNOSIS — J449 Chronic obstructive pulmonary disease, unspecified: Secondary | ICD-10-CM | POA: Diagnosis not present

## 2017-09-24 DIAGNOSIS — Z85820 Personal history of malignant melanoma of skin: Secondary | ICD-10-CM | POA: Insufficient documentation

## 2017-09-24 DIAGNOSIS — R200 Anesthesia of skin: Secondary | ICD-10-CM | POA: Diagnosis present

## 2017-09-24 DIAGNOSIS — I6200 Nontraumatic subdural hemorrhage, unspecified: Secondary | ICD-10-CM | POA: Diagnosis not present

## 2017-09-24 DIAGNOSIS — Z87891 Personal history of nicotine dependence: Secondary | ICD-10-CM | POA: Insufficient documentation

## 2017-09-24 HISTORY — DX: Nontraumatic subarachnoid hemorrhage, unspecified: I60.9

## 2017-09-24 LAB — CBC
HEMATOCRIT: 37.2 % (ref 36.0–46.0)
HEMOGLOBIN: 11.8 g/dL — AB (ref 12.0–15.0)
MCH: 31.8 pg (ref 26.0–34.0)
MCHC: 31.7 g/dL (ref 30.0–36.0)
MCV: 100.3 fL — ABNORMAL HIGH (ref 78.0–100.0)
Platelets: 219 10*3/uL (ref 150–400)
RBC: 3.71 MIL/uL — ABNORMAL LOW (ref 3.87–5.11)
RDW: 14.1 % (ref 11.5–15.5)
WBC: 6 10*3/uL (ref 4.0–10.5)

## 2017-09-24 LAB — COMPREHENSIVE METABOLIC PANEL
ALT: 46 U/L — ABNORMAL HIGH (ref 0–44)
ANION GAP: 7 (ref 5–15)
AST: 25 U/L (ref 15–41)
Albumin: 4 g/dL (ref 3.5–5.0)
Alkaline Phosphatase: 62 U/L (ref 38–126)
BUN: 19 mg/dL (ref 8–23)
CHLORIDE: 106 mmol/L (ref 98–111)
CO2: 29 mmol/L (ref 22–32)
Calcium: 9.1 mg/dL (ref 8.9–10.3)
Creatinine, Ser: 1.05 mg/dL — ABNORMAL HIGH (ref 0.44–1.00)
GFR, EST AFRICAN AMERICAN: 54 mL/min — AB (ref >60)
GFR, EST NON AFRICAN AMERICAN: 47 mL/min — AB (ref >60)
Glucose, Bld: 97 mg/dL (ref 70–99)
POTASSIUM: 3.8 mmol/L (ref 3.5–5.1)
Sodium: 142 mmol/L (ref 135–145)
Total Bilirubin: 0.5 mg/dL (ref 0.3–1.2)
Total Protein: 6.7 g/dL (ref 6.5–8.1)

## 2017-09-24 LAB — PROTIME-INR
INR: 0.97
Prothrombin Time: 12.8 seconds (ref 11.4–15.2)

## 2017-09-24 LAB — DIFFERENTIAL
BASOS ABS: 0 10*3/uL (ref 0.0–0.1)
BASOS PCT: 0 %
EOS ABS: 0.2 10*3/uL (ref 0.0–0.7)
Eosinophils Relative: 3 %
Lymphocytes Relative: 22 %
Lymphs Abs: 1.3 10*3/uL (ref 0.7–4.0)
MONOS PCT: 7 %
Monocytes Absolute: 0.4 10*3/uL (ref 0.1–1.0)
Neutro Abs: 4 10*3/uL (ref 1.7–7.7)
Neutrophils Relative %: 68 %

## 2017-09-24 LAB — I-STAT CHEM 8, ED
BUN: 28 mg/dL — AB (ref 8–23)
CREATININE: 1.1 mg/dL — AB (ref 0.44–1.00)
Calcium, Ion: 1.2 mmol/L (ref 1.15–1.40)
Chloride: 102 mmol/L (ref 98–111)
GLUCOSE: 91 mg/dL (ref 70–99)
HEMATOCRIT: 36 % (ref 36.0–46.0)
Hemoglobin: 12.2 g/dL (ref 12.0–15.0)
POTASSIUM: 4.3 mmol/L (ref 3.5–5.1)
Sodium: 139 mmol/L (ref 135–145)
TCO2: 30 mmol/L (ref 22–32)

## 2017-09-24 LAB — ETHANOL: Alcohol, Ethyl (B): <10 mg/dL (ref <10)

## 2017-09-24 LAB — I-STAT TROPONIN, ED: TROPONIN I, POC: 0 ng/mL (ref 0.00–0.08)

## 2017-09-24 LAB — APTT: aPTT: 26 seconds (ref 24–36)

## 2017-09-24 NOTE — ED Notes (Signed)
Pt to CT at this time.

## 2017-09-24 NOTE — ED Triage Notes (Addendum)
Pt was eating dinner when lt face and arm felt numb. Son stated that her speech is different.  Pt moved to room 18 to evaluated by MD for stroke.

## 2017-09-24 NOTE — ED Provider Notes (Signed)
[facility] EMERGENCY DEPARTMENT Provider Note   CSN: [id number] Arrival date & time: 09/24/17  2133   An emergency department physician performed an initial assessment on this suspected stroke patient at 2206.  History   Chief Complaint Chief Complaint  Patient presents with  . Numbness    left arm and face     HPI Bridget Ball is a 84 y.o. female.  The history is provided by the patient, a relative and a caregiver. The history is limited by the condition of the patient (Hx dementia).  Pt was seen at 2200.  Per pt's family: Pt's family states they were eating dinner approximately 1 hour PTA when pt began to c/o left lips "numbness." Pt then began to state her left face became numb, and then shortly afterwards c/o LUE and and LLE "numbness." No reported focal motor weakness, no slurred speech, no facial droop.    Past Medical History:  Diagnosis Date  . Anxiety   . Arthritis    "in my back" (06/22/2017)  . Attention to nephrostomy ([place])    PT HAS NEPHROSTOMY TUBE IN PLACE  . Bruises easily   . Chronic diarrhea   . Colitis   . COPD (chronic obstructive pulmonary disease) ([place])   . Dementia   . Depression   . GERD (gastroesophageal reflux disease)   . Hiatal hernia   . Hx of bronchitis   . Hx of pulmonary edema JULY 2015  . Hx of pulmonary edema JULY 2015  . Hx of septic shock JULY 2015  . Hydronephrosis, left   . Hyperlipidemia   . Memory difficulties   . Microscopic colitis   . Myocardial infarction [facility])    unknown time   . On home oxygen therapy    "only at night" (06/22/2017)  . Peripheral vascular disease ([place])   . Renal insufficiency   . SAH (subarachnoid hemorrhage) ([place]) 2010  . Seizures ([place])    last seizure was 4-5 years ago with "brain bleed"  . Shortness of breath    with exertion  . Skin cancer    "LE"  . Stroke [facility])    TIA four years ago / stroke NOV 2014    Patient Active Problem List   Diagnosis Date Noted  . PAD (peripheral artery  disease) ([place]) 06/22/2017  . Acute respiratory failure with hypoxia ([place]) 02/15/2017  . Leg wound, left 02/15/2017  . History of stroke 10/20/2016  . TIA (transient ischemic attack) 10/12/2016  . Acute urinary retention 10/12/2016  . Popliteal artery aneurysm ([place]) 02/05/2016  . Malnutrition of moderate degree 12/15/2015  . DVT of popliteal vein ([place]) 12/14/2015  . Aneurysm of left internal iliac artery ([place]) 12/14/2015  . Sepsis affecting skin 12/13/2015  . Dementia 12/13/2015  . GERD (gastroesophageal reflux disease) 12/13/2015  . Severe protein-calorie malnutrition ([place]) 12/13/2015  . Cellulitis of right leg 11/24/2015  . Dyslipidemia 11/24/2015  . Erythema of lower extremity 11/24/2015  . Anemia 11/01/2015  . Esophageal reflux 08/17/2013  . COPD with acute exacerbation ([place]) 08/11/2013  . NSTEMI (non-ST elevated myocardial infarction) type 2 07/26/2013  . Dehydration 07/25/2013  . AKI (acute kidney injury) ([place]) 07/25/2013  . Colitis 07/08/2013  . Hydronephrosis, left 07/08/2013  . Microscopic colitis 06/27/2013  . Memory loss 04/25/2013  . Depression 04/25/2013  . SAH (subarachnoid hemorrhage) ([place]) 11/22/2012  . Seizure disorder secondary to prior [facility] 11/22/2012    Past Surgical History:  Procedure Laterality Date  . ABDOMINAL AORTOGRAM W/LOWER EXTREMITY N/A  06/22/2017   Procedure: ABDOMINAL AORTOGRAM W/LOWER EXTREMITY;  Surgeon: Serafina Mitchell, MD;  Location: [place] CV LAB;  Service: Cardiovascular;  Laterality: N/A;  . APPENDECTOMY    . BREAST BIOPSY Right 02/02/2014   Procedure: RIGHT BREAST BIOPSY;  Surgeon: Jamesetta So, MD;  Location: [facility] ORS;  Service: General;  Laterality: Right;  . COLONOSCOPY N/A 05/24/2013   Procedure: COLONOSCOPY;  Surgeon: Rogene Houston, MD;  Location: [facility] ENDO SUITE;  Service: Endoscopy;  Laterality: N/A;  100  . CYSTOSCOPY W/ URETERAL STENT PLACEMENT Left 08/06/2015   Procedure: CYSTOSCOPY WITH LEFT URETERAL STENT EXCHANGE - Sammie Bench;  Surgeon: Franchot Gallo, MD;  Location: [facility] ORS;  Service: Urology;  Laterality: Left;  . CYSTOSCOPY W/ URETERAL STENT PLACEMENT Left 07/07/2016   Procedure: CYSTOSCOPY, LEFT RETROGRADE PYELOGRAM WITH LEFT URETERAL STENT REPLACEMENT;  Surgeon: Franchot Gallo, MD;  Location: [facility] OR[facility];  Service: Urology;  Laterality: Left;  . CYSTOSCOPY W/ URETERAL STENT PLACEMENT Left 07/13/2017   Procedure: CYSTOSCOPY WITH LEFT URETERAL STENT EXCHANGE;  Surgeon: Franchot Gallo, MD;  Location: [facility] OR[facility];  Service: Urology;  Laterality: Left;  . CYSTOSCOPY WITH RETROGRADE PYELOGRAM, URETEROSCOPY AND STENT PLACEMENT Left 09/18/2013   Procedure: CYSTOSCOPY WITH RETROGRADE PYELOGRAM,  URETEROSCOPY AND  STENT PLACEMENT, removal of nephrostomy tube;  Surgeon: Jorja Loa, MD;  Location: [facility];  Service: Urology;  Laterality: Left;  . CYSTOSCOPY WITH STENT PLACEMENT Left 09/04/2014   Procedure: CYSTOSCOPY WITH LEFT J2 STENT EXCHANGE;  Surgeon: Franchot Gallo, MD;  Location: [facility] OR[facility];  Service: Urology;  Laterality: Left;  . ENDOVASCULAR REPAIR OF POPLITEAL ARTERY ANEURYSM  02/05/2016  . ESOPHAGOGASTRODUODENOSCOPY N/A 05/24/2013   Procedure: ESOPHAGOGASTRODUODENOSCOPY (EGD);  Surgeon: Rogene Houston, MD;  [facility];  Service: Endoscopy;  Laterality: N/A;  . FALSE ANEURYSM REPAIR Right 02/05/2016   Procedure: REPAIR RIGHT POLITEAL  ANEURYSM;  Surgeon: Serafina Mitchell, MD;  Location: [place];  Service: Vascular;  Laterality: Right;  . KNEE ARTHROSCOPY Right   . PERIPHERAL VASCULAR INTERVENTION Right 06/22/2017   Procedure: PERIPHERAL VASCULAR INTERVENTION;  Surgeon: Serafina Mitchell, MD;  Location: [place] CV LAB;  Service: Cardiovascular;  Laterality: Right;  . SKIN CANCER EXCISION     "LE"  . TONSILLECTOMY    . TUBAL LIGATION       OB History    Gravida  5   Para  5   Term  5   Preterm      AB      Living  5     SAB      TAB      Ectopic      Multiple      Live  Births               Home Medications    Prior to Admission medications   Medication Sig Start Date End Date Taking? Authorizing Provider  albuterol (PROVENTIL HFA;VENTOLIN HFA) 108 (90 BASE) MCG/ACT inhaler Inhale 2 puffs into the lungs every 6 (six) hours as needed for wheezing or shortness of breath. 08/03/13   Janece Canterbury, MD  albuterol (PROVENTIL) (2.5 MG/3ML) 0.083% nebulizer solution Take 2.5 mg by nebulization 2 (two) times daily.  11/11/16   [provider]  aspirin 325 MG tablet Take 325 mg by mouth daily.    [provider]  atorvastatin (LIPITOR) 80 MG tablet Take 80 mg by mouth daily at 6 PM.  12/17/16   [provider]  budesonide (ENTOCORT  EC) 3 MG 24 hr capsule Take 6 mg by mouth daily as needed (for severe diarrhea).    [provider]  citalopram (CELEXA) 10 MG tablet TAKE ONE (1) TABLET BY MOUTH EVERY DAY 03/04/17   Garvin Fila, MD  clopidogrel (PLAVIX) 75 MG tablet Take 1 tablet (75 mg total) by mouth daily with breakfast. 06/23/17   Dagoberto Ligas, PA-C  docusate sodium (COLACE) 100 MG capsule Take 100 mg by mouth daily.     [provider]  feeding supplement, ENSURE ENLIVE, (ENSURE ENLIVE) LIQD Take 237 mLs by mouth 2 (two) times daily between meals. 12/22/15   Hosie Poisson, MD  ferrous sulfate 325 (65 FE) MG tablet Take 65 mg by mouth daily.     [provider]  levETIRAcetam (KEPPRA) 250 MG tablet Take 1 tablet (250 mg total) by mouth 2 (two) times daily. 06/17/17   Garvin Fila, MD  memantine (NAMENDA XR) 28 MG CP24 24 hr capsule Take 28 mg by mouth daily.    [provider]  mirabegron ER (MYRBETRIQ) 50 MG TB24 tablet Take 50 mg by mouth daily.    [provider]  pantoprazole (PROTONIX) 40 MG tablet TAKE ONE TABLET (40MG  TOTAL) BY MOUTH EVERY OTHER DAY 03/04/17   Rogene Houston, MD  Potassium 99 MG TABS Take 99 mg by mouth daily.    [provider]  TRELEGY ELLIPTA  100-62.5-25 MCG/INH AEPB Inhale 1 puff into the lungs daily.  04/16/17   [provider]  VESICARE 5 MG tablet Take 5 mg by mouth daily.  10/06/16   [provider]    Family History Family History  Problem Relation Age of Onset  . Diabetes Mother   . Stroke Mother   . Heart attack Father     Social History Social History   Tobacco Use  . Smoking status: Former Smoker    Packs/day: 1.00    Years: 50.00    Pack years: 50.00    Types: Cigarettes    Last attempt to quit: 11/13/1997    Years since quitting: 19.8  . Smokeless tobacco: Never Used  Substance Use Topics  . Alcohol use: Yes    Alcohol/week: 2.0 standard drinks    Types: 2 Glasses of wine per week    Comment: 06/22/2017 "2 glasses a night wine; average"  . Drug use: Never     Allergies   Penicillins   Review of Systems Review of Systems  Unable to perform ROS: Dementia     Physical Exam Updated Vital Signs BP 95/63   Pulse 90   Temp 97.8 F (36.6 C) (Oral)   Resp 18   SpO2 96%    Patient Vitals for the past 24 hrs:  BP Temp Temp src Pulse Resp SpO2  09/24/17 2300 95/63 - - - 18 -  09/24/17 2245 112/73 - - 90 15 96 %  09/24/17 2230 112/70 - - 83 16 97 %  09/24/17 2228 103/72 - - - 18 -  09/24/17 2152 (!) 116/59 97.8 F (36.6 C) Oral 84 18 96 %     Physical Exam 2205: Physical examination:  Nursing notes reviewed; Vital signs and O2 SAT reviewed;  Constitutional: Well developed, Well nourished, Well hydrated, In no acute distress; Head:  Normocephalic, atraumatic; Eyes: EOMI, PERRL, No scleral icterus; ENMT: Mouth and pharynx normal, Mucous membranes moist; Neck: Supple, Full range of motion, No lymphadenopathy; Cardiovascular: Regular rate and rhythm, No gallop; Respiratory: Breath sounds clear &  equal bilaterally, No wheezes.  Speaking full sentences with ease, Normal respiratory effort/excursion; Chest: Nontender, Movement normal; Abdomen: Soft, Nontender, Nondistended, Normal  bowel sounds; Genitourinary: No CVA tenderness; Extremities: Peripheral pulses normal, No tenderness, No edema, No calf edema or asymmetry.; Neuro: Awake, alert, mildly confused per hx dementia.  Major CN grossly intact. Speech clear.  No facial droop.  No nystagmus. Grips equal. Strength 5/5 equal bilat UE's and LE's.  DTR 2/4 equal bilat UE's and LE's. +subjective decreased sensation left face, LUE and LLE, otherwise no gross sensory deficits.  Normal cerebellar testing bilat UE's (finger-nose) and LE's (heel-shin)..; Skin: Color normal, Warm, Dry.   ED Treatments / Results  Labs (all labs ordered are listed, but only abnormal results are displayed)   EKG EKG Interpretation  Date/Time:  Friday September 24 2017 22:08:44 EDT Ventricular Rate:  90 PR Interval:    QRS Duration: 96 QT Interval:  374 QTC Calculation: 458 R Axis:   60 Text Interpretation:  Normal sinus rhythm  irregular rate Borderline low voltage, extremity leads Anteroseptal infarct, age indeterminate Baseline wander When compared with ECG of 02/15/2017 Rate slower Otherwise no significant change Confirmed by Francine Graven [phone]) on 09/24/2017 10:15:03 PM   Radiology   Procedures Procedures (including critical care time)  Medications Ordered in ED Medications - No data to display   Initial Impression / Assessment and Plan / ED Course  I have reviewed the triage vital signs and the nursing notes.  Pertinent labs & imaging results that were available during my care of the patient were reviewed by me and considered in my medical decision making (see chart for details).  MDM Reviewed: previous chart, nursing note and vitals Reviewed previous: labs and ECG Interpretation: labs, ECG and CT scan Total time providing critical care: 30-74 minutes. This excludes time spent performing separately reportable procedures and services. Consults: neurology and admitting MD   CRITICAL CARE Performed by: Francine Graven Total critical care time: 35 minutes Critical care time was exclusive of separately billable procedures and treating other patients. Critical care was necessary to treat or prevent imminent or life-threatening deterioration. Critical care was time spent personally by me on the following activities: development of treatment plan with patient and/or surrogate as well as nursing, discussions with consultants, evaluation of patient's response to treatment, examination of patient, obtaining history from patient or surrogate, ordering and performing treatments and interventions, ordering and review of laboratory studies, ordering and review of radiographic studies, pulse oximetry and re-evaluation of patient's condition.  Results for orders placed or performed during the hospital encounter of 09/24/17  Ethanol  Result Value Ref Range   Alcohol, Ethyl (B) <10 <10 mg/dL  Protime-INR  Result Value Ref Range   Prothrombin Time 12.8 11.4 - 15.2 seconds   INR 0.97   APTT  Result Value Ref Range   aPTT 26 24 - 36 seconds  CBC  Result Value Ref Range   WBC 6.0 4.0 - 10.5 K/uL   RBC 3.71 (L) 3.87 - 5.11 MIL/uL   Hemoglobin 11.8 (L) 12.0 - 15.0 g/dL   HCT 37.2 36.0 - 46.0 %   MCV 100.3 (H) 78.0 - 100.0 fL   MCH 31.8 26.0 - 34.0 pg   MCHC 31.7 30.0 - 36.0 g/dL   RDW 14.1 11.5 - 15.5 %   Platelets 219 150 - 400 K/uL  Differential  Result Value Ref Range   Neutrophils Relative % 68 %   Neutro Abs 4.0 1.7 - 7.7 K/uL  Lymphocytes Relative 22 %   Lymphs Abs 1.3 0.7 - 4.0 K/uL   Monocytes Relative 7 %   Monocytes Absolute 0.4 0.1 - 1.0 K/uL   Eosinophils Relative 3 %   Eosinophils Absolute 0.2 0.0 - 0.7 K/uL   Basophils Relative 0 %   Basophils Absolute 0.0 0.0 - 0.1 K/uL  Comprehensive metabolic panel  Result Value Ref Range   Sodium 142 135 - 145 mmol/L   Potassium 3.8 3.5 - 5.1 mmol/L   Chloride 106 98 - 111 mmol/L   CO2 29 22 - 32 mmol/L   Glucose, Bld 97 70 - 99 mg/dL   BUN 19 8  - 23 mg/dL   Creatinine, Ser 1.05 (H) 0.44 - 1.00 mg/dL   Calcium 9.1 8.9 - 10.3 mg/dL   Total Protein 6.7 6.5 - 8.1 g/dL   Albumin 4.0 3.5 - 5.0 g/dL   AST 25 15 - 41 U/L   ALT 46 (H) 0 - 44 U/L   Alkaline Phosphatase 62 38 - 126 U/L   Total Bilirubin 0.5 0.3 - 1.2 mg/dL   GFR calc non Af Amer 47 (L) >60 mL/min   GFR calc Af Amer 54 (L) >60 mL/min   Anion gap 7 5 - 15  I-Stat Chem 8, ED  (not at [facility], [facility])  Result Value Ref Range   Sodium 139 135 - 145 mmol/L   Potassium 4.3 3.5 - 5.1 mmol/L   Chloride 102 98 - 111 mmol/L   BUN 28 (H) 8 - 23 mg/dL   Creatinine, Ser 1.10 (H) 0.44 - 1.00 mg/dL   Glucose, Bld 91 70 - 99 mg/dL   Calcium, Ion 1.20 1.15 - 1.40 mmol/L   TCO2 30 22 - 32 mmol/L   Hemoglobin 12.2 12.0 - 15.0 g/dL   HCT 36.0 36.0 - 46.0 %  I-stat troponin, ED (not at [facility], [facility])  Result Value Ref Range   Troponin i, poc 0.00 0.00 - 0.08 ng/mL   Comment 3           Ct Head Code Stroke Wo Contrast Result Date: 09/24/2017 CLINICAL DATA:  Code stroke.  Left-sided facial numbness. EXAM: CT HEAD WITHOUT CONTRAST TECHNIQUE: Contiguous axial images were obtained from the base of the skull through the vertex without intravenous contrast. COMPARISON:  10/13/2016 MRI head.  10/12/2016 CT head. FINDINGS: Brain: No evidence of acute infarction, hemorrhage, hydrocephalus, extra-axial collection or mass lesion/mass effect. Stable chronic microvascular ischemic changes and volume loss of the brain. Stable cortical calcifications the right paramedian parietal region likely related to prior intracranial hemorrhage given siderosis on prior MRI of the brain. Vascular: Calcific atherosclerosis of the carotid siphons. No hyperdense vessel. Skull: Normal. Negative for fracture or focal lesion. Sinuses/Orbits: No acute finding. Other: Bilateral intra-ocular lens replacement. ASPECTS [facility] Stroke Program Early CT Score) - Ganglionic level infarction (caudate, lentiform nuclei, internal capsule, insula,  M1-M3 cortex): 7 - Supraganglionic infarction (M4-M6 cortex): 3 Total score (0-10 with 10 being normal): 10 IMPRESSION: 1. No acute intracranial abnormality identified. 2. Stable chronic microvascular ischemic changes. 3. Stable small area of cortical calcification likely related chronic subarachnoid hemorrhage with superficial siderosis of the brain as seen on prior MRI. 4. ASPECTS is 10 These results were called by telephone at the time of interpretation on 09/24/2017 at 10:30 pm to Dr. Francine Graven , who verbally acknowledged these results. Electronically Signed   By: Kristine Garbe M.D.   On: 09/24/2017 22:31    2240:  Code Stroke  called on pt's arrival. Tele Neuro Dr. Stark Klein has evaluated pt (see separate note): pt not TPA candidate d/t rapidly improving symptoms as well has hx SAH, admit for further stroke workup.   2305:  T/C returned from [facility] Dr. Darrick Meigs, case discussed, including:  HPI, pertinent PM/SHx, VS/PE, dx testing, ED course and treatment:  Agreeable to admit.   Final Clinical Impressions(s) / ED Diagnoses   Final diagnoses:  TIA (transient ischemic attack)  Paresthesias    ED Discharge Orders    None       Francine Graven, DO 09/25/17 1520

## 2017-09-24 NOTE — Progress Notes (Signed)
Call time 22:10 Beeper time 22:-08 Exam started 22:17 Exam finished 22:10, images sent to soc and  radiology called 22:19  [place] ED Dr.Mcmanus [phone]

## 2017-09-24 NOTE — ED Notes (Signed)
Shanon Brow, son, [phone] Call with updates

## 2017-09-24 NOTE — Consult Note (Signed)
   [facility] Tele[facility]urology Consult Services    Date of Service:   09/24/2017 22:13:48  Impression:      .  TIA  Comments: No iv tpa with resolving sxs and near normal exam with NIHSS 1 and hx of SAH Exam is not consistent with LVO, not NIR candidate.  Metrics: Last Known Well: 09/24/2017 20:45:00 [facility] Notification Time: 09/24/2017 22:13:06 Arrival Time: 09/24/2017 21:33:00 Stamp Time: 09/24/2017 22:13:48 Time First Login Attempt: 09/24/2017 22:23:00 Video Start Time: 09/24/2017 22:23:00  Symptoms: Left facial weakness NIHSS Start Assessment Time: 09/24/2017 22:30:00 Patient is not a candidate for tPA. Patient was not deemed candidate for tPA thrombolytics because of Current or Previous ICH. Video End Time: 09/24/2017 22:35:00  CT head showed no acute hemorrhage or acute core infarct. CT head was reviewed. ER physician notified of the decision on thrombolytics management.  Our recommendations are outlined below.  Recommendations:     .  Antiplatelet Therapy Recommended     .  MRI brain, Telemetry Continue home meds of keppra, namenda, plavix, lipitor  Routine Consultation with [facility] for Follow up Care  Sign Out:     .  Discussed with Emergency Department Provider    ------------------------------------------------------------------------------  History of Present Illness:  Patient is a 84 years old Female.  Patient was brought by private transportation with symptoms of Left facial weakness  Patient with hx of [place] in 2017, currently on namenda, keppra, plavix, lipitor presents with left facial weakness and numbness on her left side. Sxs have improved while in ED with resolution of facial weakness and also paresthesia, she has mild numbness in her left leg. CT scan head per radiology is negative for acute process. She denies any acute complaints at this time.  CT head showed no acute hemorrhage or acute core infarct. CT head was  reviewed.    Examination:  1A: Level of Consciousness - Alert; keenly responsive + 0 1B: Ask Month and Age - Both Questions Right + 0 1C: Blink Eyes & Squeeze Hands - Performs Both Tasks + 0 2: Test Horizontal Extraocular Movements - Normal + 0 3: Test Visual Fields - No Visual Loss + 0 4: Test Facial Palsy (Use Grimace if Obtunded) - Normal symmetry + 0 5A: Test Left Arm Motor Drift - No Drift for 10 Seconds + 0 5B: Test Right Arm Motor Drift - No Drift for 10 Seconds + 0 6A: Test Left Leg Motor Drift - No Drift for 5 Seconds + 0 6B: Test Right Leg Motor Drift - No Drift for 5 Seconds + 0 7: Test Limb Ataxia (FNF/Heel-Shin) - No Ataxia + 0 8: Test Sensation - Mild-Moderate Loss: Less Sharp/More Dull + 1 9: Test Language/Aphasia - Normal; No aphasia + 0 10: Test Dysarthria - Normal + 0 11: Test Extinction/Inattention - No abnormality + 0  NIHSS Score: 1  Patient was informed the Neurology Consult would happen via TeleHealth consult by way of interactive audio and video telecommunications and consented to receiving care in this manner.  Due to the immediate potential for life-threatening deterioration due to underlying acute neurologic illness, I spent 35 minutes providing critical care. This time includes time for face to face visit via telemedicine, review of medical records, imaging studies and discussion of findings with providers, the patient and/or family.   Dr Sylvan Cheese   [facility] [phone]

## 2017-09-25 ENCOUNTER — Other Ambulatory Visit: Payer: Self-pay

## 2017-09-25 ENCOUNTER — Observation Stay (HOSPITAL_COMMUNITY): Payer: Medicare Other

## 2017-09-25 ENCOUNTER — Encounter (HOSPITAL_COMMUNITY): Payer: Self-pay | Admitting: *Deleted

## 2017-09-25 DIAGNOSIS — I6523 Occlusion and stenosis of bilateral carotid arteries: Secondary | ICD-10-CM | POA: Diagnosis not present

## 2017-09-25 DIAGNOSIS — G459 Transient cerebral ischemic attack, unspecified: Secondary | ICD-10-CM | POA: Diagnosis not present

## 2017-09-25 DIAGNOSIS — F0390 Unspecified dementia without behavioral disturbance: Secondary | ICD-10-CM

## 2017-09-25 DIAGNOSIS — G458 Other transient cerebral ischemic attacks and related syndromes: Secondary | ICD-10-CM | POA: Diagnosis not present

## 2017-09-25 DIAGNOSIS — R202 Paresthesia of skin: Secondary | ICD-10-CM

## 2017-09-25 DIAGNOSIS — J439 Emphysema, unspecified: Secondary | ICD-10-CM | POA: Diagnosis not present

## 2017-09-25 LAB — ECHOCARDIOGRAM COMPLETE
Height: 61 in
Weight: 1502.66 oz

## 2017-09-25 LAB — LIPID PANEL
CHOL/HDL RATIO: 2.2 ratio
CHOLESTEROL: 159 mg/dL (ref 0–200)
HDL: 72 mg/dL (ref >40)
LDL Cholesterol: 76 mg/dL (ref 0–99)
Triglycerides: 53 mg/dL (ref <150)
VLDL: 11 mg/dL (ref 0–40)

## 2017-09-25 LAB — HEMOGLOBIN A1C
Hgb A1c MFr Bld: 5.5 % (ref 4.8–5.6)
MEAN PLASMA GLUCOSE: 111.15 mg/dL

## 2017-09-25 MED ORDER — MIRABEGRON ER 25 MG PO TB24
50.0000 mg | ORAL_TABLET | Freq: Every day | ORAL | Status: DC
  Administered 2017-09-25: 50 mg via ORAL
  Filled 2017-09-25: qty 2

## 2017-09-25 MED ORDER — CLOPIDOGREL BISULFATE 75 MG PO TABS
75.0000 mg | ORAL_TABLET | Freq: Every day | ORAL | Status: DC
  Administered 2017-09-25: 75 mg via ORAL
  Filled 2017-09-25: qty 1

## 2017-09-25 MED ORDER — SENNOSIDES-DOCUSATE SODIUM 8.6-50 MG PO TABS: 1.0000 | ORAL_TABLET | Freq: Every evening | ORAL | Status: DC | PRN

## 2017-09-25 MED ORDER — ACETAMINOPHEN 325 MG PO TABS: 650.0000 mg | ORAL_TABLET | ORAL | 0 refills | Status: AC | PRN

## 2017-09-25 MED ORDER — LEVETIRACETAM 250 MG PO TABS
250.0000 mg | ORAL_TABLET | Freq: Two times a day (BID) | ORAL | Status: DC
  Administered 2017-09-25 (×2): 250 mg via ORAL
  Filled 2017-09-25 (×2): qty 1

## 2017-09-25 MED ORDER — ASPIRIN 325 MG PO TABS
325.0000 mg | ORAL_TABLET | Freq: Every day | ORAL | Status: DC
  Administered 2017-09-25: 325 mg via ORAL
  Filled 2017-09-25: qty 1

## 2017-09-25 MED ORDER — ACETAMINOPHEN 160 MG/5ML PO SOLN: 650.0000 mg | ORAL | Status: DC | PRN

## 2017-09-25 MED ORDER — ENOXAPARIN SODIUM 40 MG/0.4ML ~~LOC~~ SOLN
40.0000 mg | SUBCUTANEOUS | Status: DC
  Administered 2017-09-25: 40 mg via SUBCUTANEOUS
  Filled 2017-09-25: qty 0.4

## 2017-09-25 MED ORDER — CITALOPRAM HYDROBROMIDE 20 MG PO TABS
10.0000 mg | ORAL_TABLET | Freq: Every day | ORAL | Status: DC
  Administered 2017-09-25: 10 mg via ORAL
  Filled 2017-09-25: qty 1

## 2017-09-25 MED ORDER — ACETAMINOPHEN 650 MG RE SUPP: 650.0000 mg | RECTAL | Status: DC | PRN

## 2017-09-25 MED ORDER — STROKE: EARLY STAGES OF RECOVERY BOOK
Freq: Once | Status: AC
  Administered 2017-09-25: 03:00:00
  Filled 2017-09-25: qty 1

## 2017-09-25 MED ORDER — MEMANTINE HCL ER 28 MG PO CP24
28.0000 mg | ORAL_CAPSULE | Freq: Every day | ORAL | Status: DC
  Administered 2017-09-25: 28 mg via ORAL
  Filled 2017-09-25 (×3): qty 1

## 2017-09-25 MED ORDER — ACETAMINOPHEN 325 MG PO TABS
650.0000 mg | ORAL_TABLET | ORAL | Status: DC | PRN
  Administered 2017-09-25: 650 mg via ORAL
  Filled 2017-09-25: qty 2

## 2017-09-25 MED ORDER — PANTOPRAZOLE SODIUM 40 MG PO TBEC
40.0000 mg | DELAYED_RELEASE_TABLET | Freq: Every day | ORAL | Status: DC
  Administered 2017-09-25: 40 mg via ORAL
  Filled 2017-09-25: qty 1

## 2017-09-25 MED ORDER — FLUTICASONE-UMECLIDIN-VILANT 100-62.5-25 MCG/INH IN AEPB: 1.0000 | INHALATION_SPRAY | Freq: Every day | RESPIRATORY_TRACT | Status: DC

## 2017-09-25 MED ORDER — SODIUM CHLORIDE 0.9 % IV BOLUS
250.0000 mL | Freq: Once | INTRAVENOUS | Status: AC
  Administered 2017-09-25: 250 mL via INTRAVENOUS

## 2017-09-25 MED ORDER — ALBUTEROL SULFATE (2.5 MG/3ML) 0.083% IN NEBU: 3.0000 mL | INHALATION_SOLUTION | Freq: Four times a day (QID) | RESPIRATORY_TRACT | Status: DC | PRN

## 2017-09-25 MED ORDER — SODIUM CHLORIDE 0.9 % IV SOLN
INTRAVENOUS | Status: DC
  Administered 2017-09-25: 03:00:00 via INTRAVENOUS

## 2017-09-25 NOTE — Progress Notes (Signed)
2D Echocardiogram has been performed.  Bridget Ball 09/25/2017, 8:28 AM

## 2017-09-25 NOTE — Progress Notes (Signed)
Patient's IV removed prior to discharge.  AVS reviewed with patient's daughter.  Verbalized understanding of discharge orders, physician follow-up, medications.  Patient transported by [facility] to entrance at discharge.  Patient stable at time of discharge.

## 2017-09-25 NOTE — H&P (Signed)
TRH H&P    Patient Demographics:    Bridget Ball, is a 84 y.o. female  MRN: [id number]  DOB - 14-Apr-1928  Admit Date - 09/24/2017  Referring MD/NP/PA: Dr. Thurnell Garbe  Outpatient Primary MD for the patient is Celene Squibb, MD  Patient coming from: Home  Chief complaint-facial numbness   HPI:    Bridget Ball  is a 84 y.o. female, with history of dementia, hyperlipidemia, seizures, subarachnoid hemorrhage, COPD came to hospital with complaint of left-sided numbness of the face.  As per patient she was eating dinner after that started having numbness of the lips and face.  There is also questionable numbness of left upper and lower extremity.  Most of symptoms have resolved.  Patient was seen by tele neurology, and deemed not a candidate for TPA as she has a history of subarachnoid hemorrhage and near normal exam with a NIHSS of 1 Patient denies slurred speech Denies chest pain or shortness of breath. Denies nausea vomiting or diarrhea. Denies fever or dysuria. CT head showed no acute findings   Review of systems:    In addition to the HPI above,    All other systems reviewed and are negative.   With Past History of the following :    Past Medical History:  Diagnosis Date  . Anxiety   . Arthritis    "in my back" (06/22/2017)  . Attention to nephrostomy ([place])    PT HAS NEPHROSTOMY TUBE IN PLACE  . Bruises easily   . Chronic diarrhea   . Colitis   . COPD (chronic obstructive pulmonary disease) ([place])   . Dementia   . Depression   . GERD (gastroesophageal reflux disease)   . Hiatal hernia   . Hx of bronchitis   . Hx of pulmonary edema JULY 2015  . Hx of pulmonary edema JULY 2015  . Hx of septic shock JULY 2015  . Hydronephrosis, left   . Hyperlipidemia   . Memory difficulties   . Microscopic colitis   . Myocardial infarction [facility])    unknown time   . On home oxygen therapy    "only at night"  (06/22/2017)  . Peripheral vascular disease ([place])   . Renal insufficiency   . SAH (subarachnoid hemorrhage) ([place]) 2010  . Seizures ([place])    last seizure was 4-5 years ago with "brain bleed"  . Shortness of breath    with exertion  . Skin cancer    "LE"  . Stroke [facility])    TIA four years ago / stroke NOV 2014      Past Surgical History:  Procedure Laterality Date  . ABDOMINAL AORTOGRAM W/LOWER EXTREMITY N/A 06/22/2017   Procedure: ABDOMINAL AORTOGRAM W/LOWER EXTREMITY;  Surgeon: Serafina Mitchell, MD;  Location: [place] CV LAB;  Service: Cardiovascular;  Laterality: N/A;  . APPENDECTOMY    . BREAST BIOPSY Right 02/02/2014   Procedure: RIGHT BREAST BIOPSY;  Surgeon: Jamesetta So, MD;  Location: [facility] OR[facility];  Service: General;  Laterality: Right;  . COLONOSCOPY N/A 05/24/2013   Procedure:  COLONOSCOPY;  Surgeon: Rogene Houston, MD;  Location: [facility];  Service: Endoscopy;  Laterality: N/A;  100  . CYSTOSCOPY W/ URETERAL STENT PLACEMENT Left 08/06/2015   Procedure: CYSTOSCOPY WITH LEFT URETERAL STENT EXCHANGE - Sammie Bench;  Surgeon: Franchot Gallo, MD;  Location: [facility] OR[facility];  Service: Urology;  Laterality: Left;  . CYSTOSCOPY W/ URETERAL STENT PLACEMENT Left 07/07/2016   Procedure: CYSTOSCOPY, LEFT RETROGRADE PYELOGRAM WITH LEFT URETERAL STENT REPLACEMENT;  Surgeon: Franchot Gallo, MD;  Location: [facility] OR[facility];  Service: Urology;  Laterality: Left;  . CYSTOSCOPY W/ URETERAL STENT PLACEMENT Left 07/13/2017   Procedure: CYSTOSCOPY WITH LEFT URETERAL STENT EXCHANGE;  Surgeon: Franchot Gallo, MD;  Location: [facility];  Service: Urology;  Laterality: Left;  . CYSTOSCOPY WITH RETROGRADE PYELOGRAM, URETEROSCOPY AND STENT PLACEMENT Left 09/18/2013   Procedure: CYSTOSCOPY WITH RETROGRADE PYELOGRAM,  URETEROSCOPY AND  STENT PLACEMENT, removal of nephrostomy tube;  Surgeon: Jorja Loa, MD;  Location: [facility];  Service: Urology;  Laterality: Left;  . CYSTOSCOPY WITH STENT PLACEMENT Left 09/04/2014    Procedure: CYSTOSCOPY WITH LEFT J2 STENT EXCHANGE;  Surgeon: Franchot Gallo, MD;  Location: [facility];  Service: Urology;  Laterality: Left;  . ENDOVASCULAR REPAIR OF POPLITEAL ARTERY ANEURYSM  02/05/2016  . ESOPHAGOGASTRODUODENOSCOPY N/A 05/24/2013   Procedure: ESOPHAGOGASTRODUODENOSCOPY (EGD);  Surgeon: Rogene Houston, MD;  [facility];  Service: Endoscopy;  Laterality: N/A;  . FALSE ANEURYSM REPAIR Right 02/05/2016   Procedure: REPAIR RIGHT POLITEAL  ANEURYSM;  Surgeon: Serafina Mitchell, MD;  Location: [place];  Service: Vascular;  Laterality: Right;  . KNEE ARTHROSCOPY Right   . PERIPHERAL VASCULAR INTERVENTION Right 06/22/2017   Procedure: PERIPHERAL VASCULAR INTERVENTION;  Surgeon: Serafina Mitchell, MD;  Location: [place] CV LAB;  Service: Cardiovascular;  Laterality: Right;  . SKIN CANCER EXCISION     "LE"  . TONSILLECTOMY    . TUBAL LIGATION        Social History:      Social History   Tobacco Use  . Smoking status: Former Smoker    Packs/day: 1.00    Years: 50.00    Pack years: 50.00    Types: Cigarettes    Last attempt to quit: 11/13/1997    Years since quitting: 19.8  . Smokeless tobacco: Never Used  Substance Use Topics  . Alcohol use: Yes    Alcohol/week: 2.0 standard drinks    Types: 2 Glasses of wine per week    Comment: 06/22/2017 "2 glasses a night wine; average"       Family History :     Family History  Problem Relation Age of Onset  . Diabetes Mother   . Stroke Mother   . Heart attack Father       Home Medications:   Prior to Admission medications   Medication Sig Start Date End Date Taking? Authorizing Provider  albuterol (PROVENTIL HFA;VENTOLIN HFA) 108 (90 BASE) MCG/ACT inhaler Inhale 2 puffs into the lungs every 6 (six) hours as needed for wheezing or shortness of breath. 08/03/13  Yes Short, Noah Delaine, MD  albuterol (PROVENTIL) (2.5 MG/3ML) 0.083% nebulizer solution Take 2.5 mg by nebulization 2 (two) times daily.  11/11/16  Yes  [provider]  aspirin 325 MG tablet Take 325 mg by mouth daily.   Yes [provider]  levETIRAcetam (KEPPRA) 250 MG tablet Take 1 tablet (250 mg total) by mouth 2 (two) times daily. 06/17/17  Yes Garvin Fila, MD  TRELEGY ELLIPTA 100-62.5-25 MCG/INH AEPB  Inhale 1 puff into the lungs daily.  04/16/17  Yes [provider]  atorvastatin (LIPITOR) 80 MG tablet Take 80 mg by mouth daily at 6 PM.  12/17/16   [provider]  budesonide (ENTOCORT EC) 3 MG 24 hr capsule Take 6 mg by mouth daily as needed (for severe diarrhea).    [provider]  citalopram (CELEXA) 10 MG tablet TAKE ONE (1) TABLET BY MOUTH EVERY DAY 03/04/17   Garvin Fila, MD  clopidogrel (PLAVIX) 75 MG tablet Take 1 tablet (75 mg total) by mouth daily with breakfast. 06/23/17   Dagoberto Ligas, PA-C  docusate sodium (COLACE) 100 MG capsule Take 100 mg by mouth daily.     [provider]  feeding supplement, ENSURE ENLIVE, (ENSURE ENLIVE) LIQD Take 237 mLs by mouth 2 (two) times daily between meals. 12/22/15   Hosie Poisson, MD  ferrous sulfate 325 (65 FE) MG tablet Take 65 mg by mouth daily.     [provider]  memantine (NAMENDA XR) 28 MG CP24 24 hr capsule Take 28 mg by mouth daily.    [provider]  mirabegron ER (MYRBETRIQ) 50 MG TB24 tablet Take 50 mg by mouth daily.    [provider]  pantoprazole (PROTONIX) 40 MG tablet TAKE ONE TABLET (40MG TOTAL) BY MOUTH EVERY OTHER DAY 03/04/17   Rogene Houston, MD  Potassium 99 MG TABS Take 99 mg by mouth daily.    [provider]  VESICARE 5 MG tablet Take 5 mg by mouth daily.  10/06/16   [provider]     Allergies:     Allergies  Allergen Reactions  . Penicillins Anaphylaxis    Throat swelling as a child. Tolerated ceftriaxone 07/29/13.  Has patient had a PCN reaction causing immediate rash, facial/tongue/throat swelling, SOB or lightheadedness with hypotension: YES Has  patient had a PCN reaction causing severe rash involving mucus membranes or skin necrosis: NO Has patient had a PCN reaction that required hospitalization: UNKN Has patient had a PCN reaction occurring within the last 10 years: NO If all of the above answers are "NO", then may proceed with Cephalosporin use     Physical Exam:   Vitals  Blood pressure 90/70, pulse 94, temperature 97.8 F (36.6 C), temperature source Oral, resp. rate 18, SpO2 92 %.  1.  General: Appears in no acute distress  2. Psychiatric:  Intact judgement and  insight, awake alert, oriented x 3.  3. Neurologic: No focal neurological deficits, all cranial nerves intact.Strength 5/5 all 4 extremities, sensation intact all 4 extremities, plantars down going.  4. Eyes :  anicteric sclerae, moist conjunctivae with no lid lag. PERRLA.  5. ENMT:  Oropharynx clear with moist mucous membranes and good dentition  6. Neck:  supple, no cervical lymphadenopathy appriciated, No thyromegaly  7. Respiratory : Normal respiratory effort, good air movement bilaterally,clear to  auscultation bilaterally  8. Cardiovascular : RRR, no gallops, rubs or murmurs, no leg edema  9. Gastrointestinal:  Positive bowel sounds, abdomen soft, non-tender to palpation,no hepatosplenomegaly, no rigidity or guarding       10. Skin:  No cyanosis, normal texture and turgor, no rash, lesions or ulcers  11.Musculoskeletal:  Good muscle tone,  joints appear normal , no effusions,  normal range of motion    Data Review:    CBC Recent Labs  Lab 09/24/17 2213 09/24/17 2227  WBC 6.0  --   HGB 11.8* 12.2  HCT 37.2 36.0  PLT 219  --   MCV 100.3*  --   MCH 31.8  --   MCHC 31.7  --   RDW 14.1  --   LYMPHSABS 1.3  --   MONOABS 0.4  --   EOSABS 0.2  --   BASOSABS 0.0  --    ------------------------------------------------------------------------------------------------------------------  Results for orders placed or performed during  the hospital encounter of 09/24/17 (from the past 48 hour(s))  Ethanol     Status: None   Collection Time: 09/24/17 10:13 PM  Result Value Ref Range   Alcohol, Ethyl (B) <10 <10 mg/dL    Comment: (NOTE) Lowest detectable limit for serum alcohol is 10 mg/dL. For medical purposes only. Performed at [facility], [address] [place], [place] [address]   Protime-INR     Status: None   Collection Time: 09/24/17 10:13 PM  Result Value Ref Range   Prothrombin Time 12.8 11.4 - 15.2 seconds   INR 0.97     Comment: Performed at [facility], [address], [place], [place] [address]  APTT     Status: None   Collection Time: 09/24/17 10:13 PM  Result Value Ref Range   aPTT 26 24 - 36 seconds    Comment: Performed at [facility], [address] [place], [place] [address]  CBC     Status: Abnormal   Collection Time: 09/24/17 10:13 PM  Result Value Ref Range   WBC 6.0 4.0 - 10.5 K/uL   RBC 3.71 (L) 3.87 - 5.11 MIL/uL   Hemoglobin 11.8 (L) 12.0 - 15.0 g/dL   HCT 37.2 36.0 - 46.0 %   MCV 100.3 (H) 78.0 - 100.0 fL   MCH 31.8 26.0 - 34.0 pg   MCHC 31.7 30.0 - 36.0 g/dL   RDW 14.1 11.5 - 15.5 %   Platelets 219 150 - 400 K/uL    Comment: Performed at [facility], [address] [place], [place] [address]  Differential     Status: None   Collection Time: 09/24/17 10:13 PM  Result Value Ref Range   Neutrophils Relative % 68 %   Neutro Abs 4.0 1.7 - 7.7 K/uL   Lymphocytes Relative 22 %   Lymphs Abs 1.3 0.7 - 4.0 K/uL   Monocytes Relative 7 %   Monocytes Absolute 0.4 0.1 - 1.0 K/uL   Eosinophils Relative 3 %   Eosinophils Absolute 0.2 0.0 - 0.7 K/uL   Basophils Relative 0 %   Basophils Absolute 0.0 0.0 - 0.1 K/uL    Comment: Performed at [facility], [address], [place], [place] [address]  Comprehensive metabolic panel     Status: Abnormal   Collection Time: 09/24/17 10:13 PM  Result Value Ref Range   Sodium 142 135 - 145 mmol/L   Potassium 3.8 3.5 - 5.1 mmol/L   Chloride 106 98  - 111 mmol/L   CO2 29 22 - 32 mmol/L   Glucose, Bld 97 70 - 99 mg/dL   BUN 19 8 - 23 mg/dL   Creatinine, Ser 1.05 (H) 0.44 - 1.00 mg/dL   Calcium 9.1 8.9 - 10.3 mg/dL   Total Protein 6.7 6.5 - 8.1 g/dL   Albumin 4.0 3.5 - 5.0 g/dL   AST 25 15 - 41 U/L   ALT 46 (H) 0 - 44 U/L   Alkaline Phosphatase 62 38 - 126 U/L   Total Bilirubin 0.5 0.3 - 1.2 mg/dL   GFR calc non Af Amer 47 (L) >60 mL/min  GFR calc Af Amer 54 (L) >60 mL/min    Comment: (NOTE) The eGFR has been calculated using the CKD EPI equation. This calculation has not been validated in all clinical situations. eGFR's persistently <60 mL/min signify possible Chronic Kidney Disease.    Anion gap 7 5 - 15    Comment: Performed at [facility], [address] [place], [place] [address]  I-stat troponin, ED (not at [facility], [facility])     Status: None   Collection Time: 09/24/17 10:25 PM  Result Value Ref Range   Troponin i, poc 0.00 0.00 - 0.08 ng/mL   Comment 3            Comment: Due to the release kinetics of cTnI, a negative result within the first hours of the onset of symptoms does not rule out myocardial infarction with certainty. If myocardial infarction is still suspected, repeat the test at appropriate intervals.   I-Stat Chem 8, ED  (not at [facility], [facility])     Status: Abnormal   Collection Time: 09/24/17 10:27 PM  Result Value Ref Range   Sodium 139 135 - 145 mmol/L   Potassium 4.3 3.5 - 5.1 mmol/L   Chloride 102 98 - 111 mmol/L   BUN 28 (H) 8 - 23 mg/dL   Creatinine, Ser 1.10 (H) 0.44 - 1.00 mg/dL   Glucose, Bld 91 70 - 99 mg/dL   Calcium, Ion 1.20 1.15 - 1.40 mmol/L   TCO2 30 22 - 32 mmol/L   Hemoglobin 12.2 12.0 - 15.0 g/dL   HCT 36.0 36.0 - 46.0 %    Chemistries  Recent Labs  Lab 09/24/17 2213 09/24/17 2227  NA 142 139  K 3.8 4.3  CL 106 102  CO2 29  --   GLUCOSE 97 91  BUN 19 28*  CREATININE 1.05* 1.10*  CALCIUM 9.1  --   AST 25  --   ALT 46*  --   ALKPHOS 62  --   BILITOT 0.5  --     ------------------------------------------------------------------------------------------------------------------  ------------------------------------------------------------------------------------------------------------------ GFR: CrCl cannot be calculated (Unknown ideal weight.). Liver Function Tests: Recent Labs  Lab 09/24/17 2213  AST 25  ALT 46*  ALKPHOS 62  BILITOT 0.5  PROT 6.7  ALBUMIN 4.0   No results for input(s): LIPASE, AMYLASE in the last 168 hours. No results for input(s): AMMONIA in the last 168 hours. Coagulation Profile: Recent Labs  Lab 09/24/17 2213  INR 0.97   Cardiac Enzymes: No results for input(s): CKTOTAL, CKMB, CKMBINDEX, TROPONINI in the last 168 hours. BNP (last 3 results) No results for input(s): PROBNP in the last 8760 hours. HbA1C: No results for input(s): HGBA1C in the last 72 hours. CBG: No results for input(s): GLUCAP in the last 168 hours. Lipid Profile: No results for input(s): CHOL, HDL, LDLCALC, TRIG, CHOLHDL, LDLDIRECT in the last 72 hours. Thyroid Function Tests: No results for input(s): TSH, T4TOTAL, FREET4, T3FREE, THYROIDAB in the last 72 hours. Anemia Panel: No results for input(s): VITAMINB12, FOLATE, FERRITIN, TIBC, IRON, RETICCTPCT in the last 72 hours.  --------------------------------------------------------------------------------------------------------------- Urine analysis:    Component Value Date/Time   COLORURINE YELLOW 10/13/2016 0309   APPEARANCEUR HAZY (A) 10/13/2016 0309   LABSPEC 1.019 10/13/2016 0309   PHURINE 6.0 10/13/2016 0309   GLUCOSEU NEGATIVE 10/13/2016 0309   HGBUR SMALL (A) 10/13/2016 0309   BILIRUBINUR NEGATIVE 10/13/2016 0309   KETONESUR 5 (A) 10/13/2016 0309   PROTEINUR 30 (A) 10/13/2016 0309   UROBILINOGEN 0.2 07/26/2013 2234   NITRITE  NEGATIVE 10/13/2016 0309   LEUKOCYTESUR LARGE (A) 10/13/2016 0309      Imaging Results:    Ct Head Code Stroke Wo Contrast  Result Date:  09/24/2017 CLINICAL DATA:  Code stroke.  Left-sided facial numbness. EXAM: CT HEAD WITHOUT CONTRAST TECHNIQUE: Contiguous axial images were obtained from the base of the skull through the vertex without intravenous contrast. COMPARISON:  10/13/2016 MRI head.  10/12/2016 CT head. FINDINGS: Brain: No evidence of acute infarction, hemorrhage, hydrocephalus, extra-axial collection or mass lesion/mass effect. Stable chronic microvascular ischemic changes and volume loss of the brain. Stable cortical calcifications the right paramedian parietal region likely related to prior intracranial hemorrhage given siderosis on prior MRI of the brain. Vascular: Calcific atherosclerosis of the carotid siphons. No hyperdense vessel. Skull: Normal. Negative for fracture or focal lesion. Sinuses/Orbits: No acute finding. Other: Bilateral intra-ocular lens replacement. ASPECTS [facility] Stroke Program Early CT Score) - Ganglionic level infarction (caudate, lentiform nuclei, internal capsule, insula, M1-M3 cortex): 7 - Supraganglionic infarction (M4-M6 cortex): 3 Total score (0-10 with 10 being normal): 10 IMPRESSION: 1. No acute intracranial abnormality identified. 2. Stable chronic microvascular ischemic changes. 3. Stable small area of cortical calcification likely related chronic subarachnoid hemorrhage with superficial siderosis of the brain as seen on prior MRI. 4. ASPECTS is 10 These results were called by telephone at the time of interpretation on 09/24/2017 at 10:30 pm to Dr. Francine Graven , who verbally acknowledged these results. Electronically Signed   By: Kristine Garbe M.D.   On: 09/24/2017 22:31    My personal review of EKG: Rhythm NSR   Assessment & Plan:    Active Problems:   TIA (transient ischemic attack)   1. TIA versus stroke-will place under observation, initiate stroke work-up.  Obtain MRI/MRI brain in a.m.  Obtain echocardiogram, carotid Doppler, fasting lipid profile.  Check hemoglobin  A1c.  2. Dementia-no behavioral disturbance, continue Namenda  3. Peripheral arterial disease-continue Plavix, aspirin  4. Seizure disorder-continue Keppra  5. COPD-stable, continue PRN nebulizer, Trelegy Ellipta   DVT Prophylaxis-   Lovenox   AM Labs Ordered, also please review Full Orders  Family Communication: Admission, patients condition and plan of care including tests being ordered have been discussed with the patient  who indicate understanding and agree with the plan and Code Status.  Code Status: DNR  Admission status: Observation  Time spent in minutes : 60 minutes   Oswald Hillock M.D on 09/25/2017 at 1:03 AM  Between 7am to 7pm - Pager - [phone]. After 7pm go to [email or web address] - password [email or web address]  [facility] - Office  [phone]

## 2017-09-25 NOTE — Discharge Summary (Signed)
Bridget Ball, is a 84 y.o. female  DOB 10-09-1928  MRN [id number].  Admission date:  09/24/2017  Admitting Physician  Oswald Hillock, MD  Discharge Date:  09/25/2017   Primary MD  Celene Squibb, MD  Recommendations for primary care physician for things to follow:   1) continue Plavix 75 mg daily as advised by a vascular surgeon 2) talk to your vascular surgeon and primary care doctor about possibly reducing your aspirin from 325 mg daily to 81 mg daily while you are taking Plavix 3) you are taking aspirin and Plavix together with increased risk of bleeding so Avoid ibuprofen/Advil/Aleve/Motrin/Goody Powders/Naproxen/BC powders/Meloxicam/Diclofenac/Indomethacin and other Nonsteroidal anti-inflammatory medications as these will make you more likely to bleed and can cause stomach ulcers, can also cause Kidney problems.  4)Please always take aspirin with food to reduce risk of stomach irritation 5) please follow-up with your primary care doctor within a week for reevaluation and medication modifications   Admission Diagnosis  TIA (transient ischemic attack) [G45.9] Paresthesias [R20.2]   Discharge Diagnosis  TIA (transient ischemic attack) [G45.9] Paresthesias [R20.2]    Active Problems:   TIA (transient ischemic attack)      Past Medical History:  Diagnosis Date  . Anxiety   . Arthritis    "in my back" (06/22/2017)  . Attention to nephrostomy ([place])    PT HAS NEPHROSTOMY TUBE IN PLACE  . Bruises easily   . Chronic diarrhea   . Colitis   . COPD (chronic obstructive pulmonary disease) ([place])   . Dementia   . Depression   . GERD (gastroesophageal reflux disease)   . Hiatal hernia   . Hx of bronchitis   . Hx of pulmonary edema JULY 2015  . Hx of pulmonary edema JULY 2015  . Hx of septic shock JULY 2015  . Hydronephrosis, left   . Hyperlipidemia   . Memory difficulties   . Microscopic colitis   .  Myocardial infarction [facility])    unknown time   . On home oxygen therapy    "only at night" (06/22/2017)  . Peripheral vascular disease ([place])   . Renal insufficiency   . SAH (subarachnoid hemorrhage) ([place]) 2010  . Seizures ([place])    last seizure was 4-5 years ago with "brain bleed"  . Shortness of breath    with exertion  . Skin cancer    "LE"  . Stroke [facility])    TIA four years ago / stroke NOV 2014    Past Surgical History:  Procedure Laterality Date  . ABDOMINAL AORTOGRAM W/LOWER EXTREMITY N/A 06/22/2017   Procedure: ABDOMINAL AORTOGRAM W/LOWER EXTREMITY;  Surgeon: Serafina Mitchell, MD;  Location: [place] [facility];  Service: Cardiovascular;  Laterality: N/A;  . APPENDECTOMY    . BREAST BIOPSY Right 02/02/2014   Procedure: RIGHT BREAST BIOPSY;  Surgeon: Jamesetta So, MD;  Location: [facility] ORS;  Service: General;  Laterality: Right;  . COLONOSCOPY N/A 05/24/2013   Procedure: COLONOSCOPY;  Surgeon: Rogene Houston, MD;  Location: [facility]  ENDO SUITE;  Service: Endoscopy;  Laterality: N/A;  100  . CYSTOSCOPY W/ URETERAL STENT PLACEMENT Left 08/06/2015   Procedure: CYSTOSCOPY WITH LEFT URETERAL STENT EXCHANGE - Sammie Bench;  Surgeon: Franchot Gallo, MD;  Location: [facility] ORS;  Service: Urology;  Laterality: Left;  . CYSTOSCOPY W/ URETERAL STENT PLACEMENT Left 07/07/2016   Procedure: CYSTOSCOPY, LEFT RETROGRADE PYELOGRAM WITH LEFT URETERAL STENT REPLACEMENT;  Surgeon: Franchot Gallo, MD;  Location: [facility];  Service: Urology;  Laterality: Left;  . CYSTOSCOPY W/ URETERAL STENT PLACEMENT Left 07/13/2017   Procedure: CYSTOSCOPY WITH LEFT URETERAL STENT EXCHANGE;  Surgeon: Franchot Gallo, MD;  Location: [facility];  Service: Urology;  Laterality: Left;  . CYSTOSCOPY WITH RETROGRADE PYELOGRAM, URETEROSCOPY AND STENT PLACEMENT Left 09/18/2013   Procedure: CYSTOSCOPY WITH RETROGRADE PYELOGRAM,  URETEROSCOPY AND  STENT PLACEMENT, removal of nephrostomy tube;  Surgeon: Jorja Loa, MD;  Location: [facility];   Service: Urology;  Laterality: Left;  . CYSTOSCOPY WITH STENT PLACEMENT Left 09/04/2014   Procedure: CYSTOSCOPY WITH LEFT J2 STENT EXCHANGE;  Surgeon: Franchot Gallo, MD;  Location: [facility];  Service: Urology;  Laterality: Left;  . ENDOVASCULAR REPAIR OF POPLITEAL ARTERY ANEURYSM  02/05/2016  . ESOPHAGOGASTRODUODENOSCOPY N/A 05/24/2013   Procedure: ESOPHAGOGASTRODUODENOSCOPY (EGD);  Surgeon: Rogene Houston, MD;  [facility];  Service: Endoscopy;  Laterality: N/A;  . FALSE ANEURYSM REPAIR Right 02/05/2016   Procedure: REPAIR RIGHT POLITEAL  ANEURYSM;  Surgeon: Serafina Mitchell, MD;  Location: [place];  Service: Vascular;  Laterality: Right;  . KNEE ARTHROSCOPY Right   . PERIPHERAL VASCULAR INTERVENTION Right 06/22/2017   Procedure: PERIPHERAL VASCULAR INTERVENTION;  Surgeon: Serafina Mitchell, MD;  Location: [place] [facility];  Service: Cardiovascular;  Laterality: Right;  . SKIN CANCER EXCISION     "LE"  . TONSILLECTOMY    . TUBAL LIGATION         HPI  from the history and physical done on the day of admission:    Bridget Ball  is a 84 y.o. female, with history of dementia, hyperlipidemia, seizures, subarachnoid hemorrhage, COPD came to hospital with complaint of left-sided numbness of the face.  As per patient she was eating dinner after that started having numbness of the lips and face.  There is also questionable numbness of left upper and lower extremity.  Most of symptoms have resolved.  Patient was seen by tele neurology, and deemed not a candidate for TPA as she has a history of subarachnoid hemorrhage and near normal exam with a NIHSS of 1 Patient denies slurred speech Denies chest pain or shortness of breath. Denies nausea vomiting or diarrhea. Denies fever or dysuria. CT head showed no acute findings    Hospital Course:     1)Left-sided Facial Numbness--- completely resolved at this time, apparently this occurs from time to time, CT head without acute findings,  echocardiogram without intracardiac thrombus, preserved EF 55 to 60% without regional wall motion abnormalities or other significant concerns, carotid artery Dopplers without hemodynamically significant stenosis.  The patient's daughter who lives with daughter and according to patient neuro status is back to baseline follow-up with PCP as needed.  2)Antiplatelet Therapy-   Patient apparently is taking aspirin 325 mg and Plavix 75 mg, patient will discuss further with her vascular surgeon and her PCP if she should reduce aspirin to 81 mg while taking Plavix 75 mg  3)H/o PAD--- had Rt revascularization/popliteal arterial false aneurysm repair and stenting of lower extremities in June 2019 apparently currently  on aspirin and Plavix, continue atorvastatin  4)H/o SZ--- stable, continue Keppra  5)Dementia--stable, no significant behavioral concerns, continue Namenda  6)COPD-stable without acute exacerbation, patient is a reformed smoker, continue bronchodilators  Discharge Condition: stable  Follow UP--- PCP in less than a week   Diet and Activity recommendation:  As advised  Discharge Instructions    Discharge Instructions    Call MD for:  difficulty breathing, headache or visual disturbances   Complete by:  As directed    Call MD for:  persistant dizziness or light-headedness   Complete by:  As directed    Call MD for:  persistant nausea and vomiting   Complete by:  As directed    Call MD for:  severe uncontrolled pain   Complete by:  As directed    Call MD for:  temperature >100.4   Complete by:  As directed    Diet - low sodium heart healthy   Complete by:  As directed    Discharge instructions   Complete by:  As directed    1) continue Plavix 75 mg daily as advised by a vascular surgeon 2) talk to your vascular surgeon and primary care doctor about possibly reducing your aspirin from 325 mg daily to 81 mg daily while you are taking Plavix 3) you are taking aspirin and Plavix  together with increased risk of bleeding so Avoid ibuprofen/Advil/Aleve/Motrin/Goody Powders/Naproxen/BC powders/Meloxicam/Diclofenac/Indomethacin and other Nonsteroidal anti-inflammatory medications as these will make you more likely to bleed and can cause stomach ulcers, can also cause Kidney problems.  4)Please always take aspirin with food to reduce risk of stomach irritation 5) please follow-up with your primary care doctor within a week for reevaluation and medication modifications   Increase activity slowly   Complete by:  As directed         Discharge Medications     Allergies as of 09/25/2017      Reactions   Penicillins Anaphylaxis   Throat swelling as a child. Tolerated ceftriaxone 07/29/13.  Has patient had a PCN reaction causing immediate rash, facial/tongue/throat swelling, SOB or lightheadedness with hypotension: YES Has patient had a PCN reaction causing severe rash involving mucus membranes or skin necrosis: NO Has patient had a PCN reaction that required hospitalization: UNKN Has patient had a PCN reaction occurring within the last 10 years: NO If all of the above answers are "NO", then may proceed with Cephalosporin use      Medication List    TAKE these medications   acetaminophen 325 MG tablet Commonly known as:  TYLENOL Take 2 tablets (650 mg total) by mouth every 4 (four) hours as needed for mild pain (or temp > 37.5 C (99.5 F)).   albuterol 108 (90 Base) MCG/ACT inhaler Commonly known as:  PROVENTIL HFA;VENTOLIN HFA Inhale 2 puffs into the lungs every 6 (six) hours as needed for wheezing or shortness of breath.   albuterol (2.5 MG/3ML) 0.083% nebulizer solution Commonly known as:  PROVENTIL Take 2.5 mg by nebulization 2 (two) times daily.   aspirin 325 MG tablet Take 325 mg by mouth daily.   atorvastatin 80 MG tablet Commonly known as:  LIPITOR Take 80 mg by mouth daily at 6 PM.   budesonide 3 MG 24 hr capsule Commonly known as:  ENTOCORT EC Take 6  mg by mouth daily as needed (for severe diarrhea).   citalopram 10 MG tablet Commonly known as:  CELEXA TAKE ONE (1) TABLET BY MOUTH EVERY DAY   clopidogrel 75 MG  tablet Commonly known as:  PLAVIX Take 1 tablet (75 mg total) by mouth daily with breakfast.   docusate sodium 100 MG capsule Commonly known as:  COLACE Take 100 mg by mouth daily.   feeding supplement (ENSURE ENLIVE) Liqd Take 237 mLs by mouth 2 (two) times daily between meals.   ferrous sulfate 325 (65 FE) MG tablet Take 65 mg by mouth daily.   levETIRAcetam 250 MG tablet Commonly known as:  KEPPRA Take 1 tablet (250 mg total) by mouth 2 (two) times daily.   NAMENDA XR 28 MG Cp24 24 hr capsule Generic drug:  memantine Take 28 mg by mouth daily.   pantoprazole 40 MG tablet Commonly known as:  PROTONIX TAKE ONE TABLET (40MG  TOTAL) BY MOUTH EVERY OTHER DAY   Potassium 99 MG Tabs Take 99 mg by mouth daily.   TRELEGY ELLIPTA 100-62.5-25 MCG/INH Aepb Generic drug:  Fluticasone-Umeclidin-Vilant Inhale 1 puff into the lungs daily.   VESICARE 5 MG tablet Generic drug:  solifenacin Take 5 mg by mouth daily.       Major procedures and Radiology Reports - PLEASE review detailed and final reports for all details, in brief -    Dg Chest Port 1v Same Day  Result Date: 09/25/2017 CLINICAL DATA:  84 year old female with facial numbness. EXAM: PORTABLE CHEST 1 VIEW COMPARISON:  Chest radiograph dated 02/15/2017 FINDINGS: There is emphysematous changes of the lungs. Probable bilateral small pleural effusions similar to prior radiograph. No lobar consolidation or pneumothorax. Stable cardiac silhouette. No acute osseous pathology. IMPRESSION: 1. Emphysema. 2. Probable small bilateral pleural effusions.  No interval change. Electronically Signed   By: Anner Crete M.D.   On: 09/25/2017 01:40   Ct Head Code Stroke Wo Contrast  Result Date: 09/24/2017 CLINICAL DATA:  Code stroke.  Left-sided facial numbness. EXAM: CT  HEAD WITHOUT CONTRAST TECHNIQUE: Contiguous axial images were obtained from the base of the skull through the vertex without intravenous contrast. COMPARISON:  10/13/2016 MRI head.  10/12/2016 CT head. FINDINGS: Brain: No evidence of acute infarction, hemorrhage, hydrocephalus, extra-axial collection or mass lesion/mass effect. Stable chronic microvascular ischemic changes and volume loss of the brain. Stable cortical calcifications the right paramedian parietal region likely related to prior intracranial hemorrhage given siderosis on prior MRI of the brain. Vascular: Calcific atherosclerosis of the carotid siphons. No hyperdense vessel. Skull: Normal. Negative for fracture or focal lesion. Sinuses/Orbits: No acute finding. Other: Bilateral intra-ocular lens replacement. ASPECTS [facility] Stroke Program Early CT Score) - Ganglionic level infarction (caudate, lentiform nuclei, internal capsule, insula, M1-M3 cortex): 7 - Supraganglionic infarction (M4-M6 cortex): 3 Total score (0-10 with 10 being normal): 10 IMPRESSION: 1. No acute intracranial abnormality identified. 2. Stable chronic microvascular ischemic changes. 3. Stable small area of cortical calcification likely related chronic subarachnoid hemorrhage with superficial siderosis of the brain as seen on prior MRI. 4. ASPECTS is 10 These results were called by telephone at the time of interpretation on 09/24/2017 at 10:30 pm to Dr. Francine Graven , who verbally acknowledged these results. Electronically Signed   By: Kristine Garbe M.D.   On: 09/24/2017 22:31    Micro Results   No results found for this or any previous visit (from the past 240 hour(s)).     Today   Subjective    Bridget Ball today has no new concerns, patient's daughter at bedside questions answered, patient back to baseline apparently , ambulating around the room eager to go home  Patient has been seen and examined prior to discharge   Objective   Blood  pressure 110/73, pulse 78, temperature 98.2 F (36.8 C), temperature source Oral, resp. rate 18, height 5\' 1"  (1.549 m), weight 42.6 kg, SpO2 96 %.   Intake/Output Summary (Last 24 hours) at 09/25/2017 1104 Last data filed at 09/25/2017 0900 Gross per 24 hour  Intake 498.81 ml  Output 600 ml  Net -101.19 ml    Exam Gen:- Awake Alert, cachectic, in no acute distress HEENT:- Register.AT, No sclera icterus Neck-Supple Neck,No JVD,.  Lungs-  CTAB , good air movement CV- S1, S2 normal, regular Abd-  +ve B.Sounds, Abd Soft, No tenderness,    Extremity/Skin:- No  edema,   good pulses Psych-affect is appropriate, oriented x3 Neuro-no new focal deficits, no tremors   Data Review   CBC w Diff:  Lab Results  Component Value Date   WBC 6.0 09/24/2017   HGB 12.2 09/24/2017   HCT 36.0 09/24/2017   PLT 219 09/24/2017   LYMPHOPCT 22 09/24/2017   MONOPCT 7 09/24/2017   EOSPCT 3 09/24/2017   BASOPCT 0 09/24/2017    CMP:  Lab Results  Component Value Date   NA 139 09/24/2017   NA 142 06/24/2015   K 4.3 09/24/2017   CL 102 09/24/2017   CO2 29 09/24/2017   BUN 28 (H) 09/24/2017   BUN 16 06/24/2015   CREATININE 1.10 (H) 09/24/2017   PROT 6.7 09/24/2017   PROT 6.2 06/24/2015   ALBUMIN 4.0 09/24/2017   ALBUMIN 4.0 06/24/2015   BILITOT 0.5 09/24/2017   BILITOT 0.3 06/24/2015   ALKPHOS 62 09/24/2017   AST 25 09/24/2017   ALT 46 (H) 09/24/2017  .   Total Discharge time is about 33 minutes  Roxan Hockey M.D on 09/25/2017 at 11:04 AM  Pager---[phone]  Go to [email or web address] - password [email or web address] for contact info  [facility] - Office  [phone]

## 2017-09-25 NOTE — Evaluation (Signed)
Physical Therapy Evaluation Patient Details Name: Bridget Ball MRN: [id number] DOB: 1928-03-05 Today's Date: 09/25/2017   History of Present Illness   Bridget Ball  is a 84 y.o. female, with history of dementia, hyperlipidemia, seizures, subarachnoid hemorrhage, COPD came to hospital with complaint of left-sided numbness of the face.  As per patient she was eating dinner after that started having numbness of the lips and face.  There is also questionable numbness of left upper and lower extremity.  Most of symptoms have resolved.  Patient was seen by tele neurology, and deemed not a candidate for TPA as she has a history of subarachnoid hemorrhage and near normal exam with a NIHSS of 1  Clinical Impression  Pt is known to this therapist and is functioning at prior level.      Follow Up Recommendations No PT follow up    Equipment Recommendations  None recommended by PT    Recommendations for Other Services   none    Precautions / Restrictions   none     Mobility  Bed Mobility Overal bed mobility: Independent                Transfers Overall transfer level: Independent                  Ambulation/Gait Ambulation/Gait assistance: Independent Gait Distance (Feet): 150 Feet Assistive device: Rolling walker (2 wheeled);None Gait Pattern/deviations: WFL(Within Functional Limits)     General Gait Details: ambulated 80 ft with RW; 65 ft with no assistive device  Stairs            Wheelchair Mobility    Modified Rankin (Stroke Patients Only)       Balance Overall balance assessment: Independent                                           Pertinent Vitals/Pain Pain Assessment: No/denies pain    Home Living Family/patient expects to be discharged to:: Private residence Living Arrangements: Children Available Help at Discharge: Family Type of Home: House Home Access: Level entry     Home Layout: One level Home Equipment:  Environmental consultant - 2 wheels;Cane - single point Additional Comments: ambulates without AD    Prior Function Level of Independence: Independent with assistive device(s)               Hand Dominance    Rt    Extremity/Trunk Assessment        Lower Extremity Assessment Lower Extremity Assessment: Overall WFL for tasks assessed    Cervical / Trunk Assessment Cervical / Trunk Assessment: Normal  Communication   Communication: No difficulties  Cognition Arousal/Alertness: Awake/alert Behavior During Therapy: WFL for tasks assessed/performed Overall Cognitive Status: Within Functional Limits for tasks assessed                                        General Comments  PT at prior level     Exercises  none   Assessment/Plan    PT Assessment Patent does not need any further PT services  PT Problem List         PT Treatment Interventions   none   PT Goals (Current goals can be found in the Care Plan section)       Frequency  Pt will not be seen for skilled therapy    Barriers to discharge    none    Co-evaluation               AM-PAC PT "6 Clicks" Daily Activity  Outcome Measure                  End of Session Equipment Utilized During Treatment: Gait belt Activity Tolerance: Patient tolerated treatment well Patient left: in chair;with chair alarm set;with family/visitor present   PT Visit Diagnosis: Muscle weakness (generalized) (M62.81)    Time: 1010-1043 PT Time Calculation (min) (ACUTE ONLY): 33 min   Charges:   PT Evaluation $PT Eval Low Complexity: [place], PT CLT [phone] 09/25/2017, 10:43 AM

## 2017-09-25 NOTE — Discharge Instructions (Signed)
1) continue Plavix 75 mg daily as advised by a vascular surgeon 2) talk to your vascular surgeon and primary care doctor about possibly reducing your aspirin from 325 mg daily to 81 mg daily while you are taking Plavix 3) you are taking aspirin and Plavix together with increased risk of bleeding so Avoid ibuprofen/Advil/Aleve/Motrin/Goody Powders/Naproxen/BC powders/Meloxicam/Diclofenac/Indomethacin and other Nonsteroidal anti-inflammatory medications as these will make you more likely to bleed and can cause stomach ulcers, can also cause Kidney problems.  4)Please always take aspirin with food to reduce risk of stomach irritation 5) please follow-up with your primary care doctor within a week for reevaluation and medication modifications

## 2017-10-04 DIAGNOSIS — Z86718 Personal history of other venous thrombosis and embolism: Secondary | ICD-10-CM | POA: Diagnosis not present

## 2017-10-04 DIAGNOSIS — Z8669 Personal history of other diseases of the nervous system and sense organs: Secondary | ICD-10-CM | POA: Diagnosis not present

## 2017-10-04 DIAGNOSIS — E559 Vitamin D deficiency, unspecified: Secondary | ICD-10-CM | POA: Diagnosis not present

## 2017-10-04 DIAGNOSIS — K5100 Ulcerative (chronic) pancolitis without complications: Secondary | ICD-10-CM | POA: Diagnosis not present

## 2017-10-04 DIAGNOSIS — L539 Erythematous condition, unspecified: Secondary | ICD-10-CM | POA: Diagnosis not present

## 2017-10-04 DIAGNOSIS — I679 Cerebrovascular disease, unspecified: Secondary | ICD-10-CM | POA: Diagnosis not present

## 2017-10-04 DIAGNOSIS — L819 Disorder of pigmentation, unspecified: Secondary | ICD-10-CM | POA: Diagnosis not present

## 2017-10-04 DIAGNOSIS — R202 Paresthesia of skin: Secondary | ICD-10-CM | POA: Diagnosis not present

## 2017-10-04 DIAGNOSIS — Z0000 Encounter for general adult medical examination without abnormal findings: Secondary | ICD-10-CM | POA: Diagnosis not present

## 2017-10-12 DIAGNOSIS — J449 Chronic obstructive pulmonary disease, unspecified: Secondary | ICD-10-CM | POA: Diagnosis not present

## 2017-10-19 DIAGNOSIS — J449 Chronic obstructive pulmonary disease, unspecified: Secondary | ICD-10-CM | POA: Diagnosis not present

## 2017-10-19 DIAGNOSIS — R269 Unspecified abnormalities of gait and mobility: Secondary | ICD-10-CM | POA: Diagnosis not present

## 2017-11-01 DIAGNOSIS — L820 Inflamed seborrheic keratosis: Secondary | ICD-10-CM | POA: Diagnosis not present

## 2017-11-01 DIAGNOSIS — L821 Other seborrheic keratosis: Secondary | ICD-10-CM | POA: Diagnosis not present

## 2017-11-01 DIAGNOSIS — L570 Actinic keratosis: Secondary | ICD-10-CM | POA: Diagnosis not present

## 2017-11-08 DIAGNOSIS — J441 Chronic obstructive pulmonary disease with (acute) exacerbation: Secondary | ICD-10-CM | POA: Diagnosis not present

## 2017-11-08 DIAGNOSIS — J449 Chronic obstructive pulmonary disease, unspecified: Secondary | ICD-10-CM | POA: Diagnosis not present

## 2017-11-08 DIAGNOSIS — D509 Iron deficiency anemia, unspecified: Secondary | ICD-10-CM | POA: Diagnosis not present

## 2017-11-08 DIAGNOSIS — Z23 Encounter for immunization: Secondary | ICD-10-CM | POA: Diagnosis not present

## 2017-11-08 DIAGNOSIS — E782 Mixed hyperlipidemia: Secondary | ICD-10-CM | POA: Diagnosis not present

## 2017-11-16 ENCOUNTER — Ambulatory Visit (INDEPENDENT_AMBULATORY_CARE_PROVIDER_SITE_OTHER): Payer: Medicare Other | Admitting: Urology

## 2017-11-16 DIAGNOSIS — N131 Hydronephrosis with ureteral stricture, not elsewhere classified: Secondary | ICD-10-CM | POA: Diagnosis not present

## 2017-11-19 DIAGNOSIS — R269 Unspecified abnormalities of gait and mobility: Secondary | ICD-10-CM | POA: Diagnosis not present

## 2017-11-19 DIAGNOSIS — J449 Chronic obstructive pulmonary disease, unspecified: Secondary | ICD-10-CM | POA: Diagnosis not present

## 2017-12-19 DIAGNOSIS — J449 Chronic obstructive pulmonary disease, unspecified: Secondary | ICD-10-CM | POA: Diagnosis not present

## 2017-12-19 DIAGNOSIS — R269 Unspecified abnormalities of gait and mobility: Secondary | ICD-10-CM | POA: Diagnosis not present

## 2018-01-11 DIAGNOSIS — D225 Melanocytic nevi of trunk: Secondary | ICD-10-CM | POA: Diagnosis not present

## 2018-01-11 DIAGNOSIS — L308 Other specified dermatitis: Secondary | ICD-10-CM | POA: Diagnosis not present

## 2018-01-11 DIAGNOSIS — L821 Other seborrheic keratosis: Secondary | ICD-10-CM | POA: Diagnosis not present

## 2018-01-11 DIAGNOSIS — L570 Actinic keratosis: Secondary | ICD-10-CM | POA: Diagnosis not present

## 2018-01-11 DIAGNOSIS — L820 Inflamed seborrheic keratosis: Secondary | ICD-10-CM | POA: Diagnosis not present

## 2018-01-11 DIAGNOSIS — L814 Other melanin hyperpigmentation: Secondary | ICD-10-CM | POA: Diagnosis not present

## 2018-01-19 DIAGNOSIS — R269 Unspecified abnormalities of gait and mobility: Secondary | ICD-10-CM | POA: Diagnosis not present

## 2018-01-19 DIAGNOSIS — J449 Chronic obstructive pulmonary disease, unspecified: Secondary | ICD-10-CM | POA: Diagnosis not present

## 2018-02-06 ENCOUNTER — Inpatient Hospital Stay (HOSPITAL_COMMUNITY)
Admission: EM | Admit: 2018-02-06 | Discharge: 2018-03-06 | DRG: 064 | Disposition: E | Payer: Medicare Other | Attending: Neurology | Admitting: Neurology

## 2018-02-06 ENCOUNTER — Emergency Department (HOSPITAL_COMMUNITY): Payer: Medicare Other

## 2018-02-06 ENCOUNTER — Encounter (HOSPITAL_COMMUNITY): Payer: Self-pay | Admitting: Emergency Medicine

## 2018-02-06 ENCOUNTER — Other Ambulatory Visit: Payer: Self-pay

## 2018-02-06 ENCOUNTER — Inpatient Hospital Stay (HOSPITAL_COMMUNITY): Payer: Medicare Other

## 2018-02-06 ENCOUNTER — Other Ambulatory Visit (HOSPITAL_COMMUNITY): Payer: Medicare Other

## 2018-02-06 DIAGNOSIS — I610 Nontraumatic intracerebral hemorrhage in hemisphere, subcortical: Secondary | ICD-10-CM | POA: Diagnosis not present

## 2018-02-06 DIAGNOSIS — E46 Unspecified protein-calorie malnutrition: Secondary | ICD-10-CM | POA: Diagnosis not present

## 2018-02-06 DIAGNOSIS — R402122 Coma scale, eyes open, to pain, at arrival to emergency department: Secondary | ICD-10-CM | POA: Diagnosis present

## 2018-02-06 DIAGNOSIS — I6200 Nontraumatic subdural hemorrhage, unspecified: Secondary | ICD-10-CM | POA: Diagnosis present

## 2018-02-06 DIAGNOSIS — K219 Gastro-esophageal reflux disease without esophagitis: Secondary | ICD-10-CM | POA: Diagnosis not present

## 2018-02-06 DIAGNOSIS — R402342 Coma scale, best motor response, flexion withdrawal, at arrival to emergency department: Secondary | ICD-10-CM | POA: Diagnosis present

## 2018-02-06 DIAGNOSIS — G935 Compression of brain: Secondary | ICD-10-CM | POA: Diagnosis not present

## 2018-02-06 DIAGNOSIS — Z9981 Dependence on supplemental oxygen: Secondary | ICD-10-CM

## 2018-02-06 DIAGNOSIS — Z8249 Family history of ischemic heart disease and other diseases of the circulatory system: Secondary | ICD-10-CM

## 2018-02-06 DIAGNOSIS — E876 Hypokalemia: Secondary | ICD-10-CM | POA: Diagnosis not present

## 2018-02-06 DIAGNOSIS — Z4682 Encounter for fitting and adjustment of non-vascular catheter: Secondary | ICD-10-CM | POA: Diagnosis not present

## 2018-02-06 DIAGNOSIS — R1310 Dysphagia, unspecified: Secondary | ICD-10-CM | POA: Diagnosis present

## 2018-02-06 DIAGNOSIS — Z880 Allergy status to penicillin: Secondary | ICD-10-CM

## 2018-02-06 DIAGNOSIS — Z515 Encounter for palliative care: Secondary | ICD-10-CM | POA: Diagnosis not present

## 2018-02-06 DIAGNOSIS — F0390 Unspecified dementia without behavioral disturbance: Secondary | ICD-10-CM | POA: Diagnosis present

## 2018-02-06 DIAGNOSIS — Z823 Family history of stroke: Secondary | ICD-10-CM

## 2018-02-06 DIAGNOSIS — R402232 Coma scale, best verbal response, inappropriate words, at arrival to emergency department: Secondary | ICD-10-CM | POA: Diagnosis present

## 2018-02-06 DIAGNOSIS — Z66 Do not resuscitate: Secondary | ICD-10-CM | POA: Diagnosis not present

## 2018-02-06 DIAGNOSIS — J9600 Acute respiratory failure, unspecified whether with hypoxia or hypercapnia: Secondary | ICD-10-CM | POA: Diagnosis present

## 2018-02-06 DIAGNOSIS — G8194 Hemiplegia, unspecified affecting left nondominant side: Secondary | ICD-10-CM | POA: Diagnosis present

## 2018-02-06 DIAGNOSIS — Z85828 Personal history of other malignant neoplasm of skin: Secondary | ICD-10-CM | POA: Diagnosis not present

## 2018-02-06 DIAGNOSIS — E785 Hyperlipidemia, unspecified: Secondary | ICD-10-CM | POA: Diagnosis not present

## 2018-02-06 DIAGNOSIS — I629 Nontraumatic intracranial hemorrhage, unspecified: Secondary | ICD-10-CM | POA: Diagnosis not present

## 2018-02-06 DIAGNOSIS — Z86718 Personal history of other venous thrombosis and embolism: Secondary | ICD-10-CM

## 2018-02-06 DIAGNOSIS — I2510 Atherosclerotic heart disease of native coronary artery without angina pectoris: Secondary | ICD-10-CM | POA: Diagnosis present

## 2018-02-06 DIAGNOSIS — I619 Nontraumatic intracerebral hemorrhage, unspecified: Secondary | ICD-10-CM | POA: Diagnosis not present

## 2018-02-06 DIAGNOSIS — Z7902 Long term (current) use of antithrombotics/antiplatelets: Secondary | ICD-10-CM

## 2018-02-06 DIAGNOSIS — G936 Cerebral edema: Secondary | ICD-10-CM | POA: Diagnosis not present

## 2018-02-06 DIAGNOSIS — Z681 Body mass index (BMI) 19 or less, adult: Secondary | ICD-10-CM | POA: Diagnosis not present

## 2018-02-06 DIAGNOSIS — I252 Old myocardial infarction: Secondary | ICD-10-CM | POA: Diagnosis not present

## 2018-02-06 DIAGNOSIS — R531 Weakness: Secondary | ICD-10-CM | POA: Diagnosis not present

## 2018-02-06 DIAGNOSIS — J9690 Respiratory failure, unspecified, unspecified whether with hypoxia or hypercapnia: Secondary | ICD-10-CM

## 2018-02-06 DIAGNOSIS — E870 Hyperosmolality and hypernatremia: Secondary | ICD-10-CM | POA: Diagnosis not present

## 2018-02-06 DIAGNOSIS — E43 Unspecified severe protein-calorie malnutrition: Secondary | ICD-10-CM | POA: Diagnosis present

## 2018-02-06 DIAGNOSIS — I611 Nontraumatic intracerebral hemorrhage in hemisphere, cortical: Secondary | ICD-10-CM | POA: Diagnosis not present

## 2018-02-06 DIAGNOSIS — R29712 NIHSS score 12: Secondary | ICD-10-CM | POA: Diagnosis not present

## 2018-02-06 DIAGNOSIS — J9601 Acute respiratory failure with hypoxia: Secondary | ICD-10-CM

## 2018-02-06 DIAGNOSIS — S06360A Traumatic hemorrhage of cerebrum, unspecified, without loss of consciousness, initial encounter: Secondary | ICD-10-CM | POA: Diagnosis not present

## 2018-02-06 DIAGNOSIS — J984 Other disorders of lung: Secondary | ICD-10-CM | POA: Diagnosis not present

## 2018-02-06 DIAGNOSIS — R29810 Facial weakness: Secondary | ICD-10-CM | POA: Diagnosis not present

## 2018-02-06 DIAGNOSIS — I615 Nontraumatic intracerebral hemorrhage, intraventricular: Secondary | ICD-10-CM | POA: Diagnosis present

## 2018-02-06 DIAGNOSIS — G9340 Encephalopathy, unspecified: Secondary | ICD-10-CM | POA: Diagnosis present

## 2018-02-06 DIAGNOSIS — Z7982 Long term (current) use of aspirin: Secondary | ICD-10-CM

## 2018-02-06 DIAGNOSIS — Z833 Family history of diabetes mellitus: Secondary | ICD-10-CM

## 2018-02-06 DIAGNOSIS — Z789 Other specified health status: Secondary | ICD-10-CM

## 2018-02-06 DIAGNOSIS — Z87891 Personal history of nicotine dependence: Secondary | ICD-10-CM

## 2018-02-06 DIAGNOSIS — I5032 Chronic diastolic (congestive) heart failure: Secondary | ICD-10-CM | POA: Diagnosis not present

## 2018-02-06 DIAGNOSIS — J449 Chronic obstructive pulmonary disease, unspecified: Secondary | ICD-10-CM | POA: Diagnosis not present

## 2018-02-06 DIAGNOSIS — I612 Nontraumatic intracerebral hemorrhage in hemisphere, unspecified: Secondary | ICD-10-CM | POA: Diagnosis not present

## 2018-02-06 DIAGNOSIS — R262 Difficulty in walking, not elsewhere classified: Secondary | ICD-10-CM | POA: Diagnosis not present

## 2018-02-06 DIAGNOSIS — R51 Headache: Secondary | ICD-10-CM | POA: Diagnosis not present

## 2018-02-06 DIAGNOSIS — I361 Nonrheumatic tricuspid (valve) insufficiency: Secondary | ICD-10-CM | POA: Diagnosis not present

## 2018-02-06 DIAGNOSIS — I639 Cerebral infarction, unspecified: Secondary | ICD-10-CM | POA: Diagnosis not present

## 2018-02-06 DIAGNOSIS — I739 Peripheral vascular disease, unspecified: Secondary | ICD-10-CM | POA: Diagnosis present

## 2018-02-06 DIAGNOSIS — I110 Hypertensive heart disease with heart failure: Secondary | ICD-10-CM | POA: Diagnosis present

## 2018-02-06 LAB — URINALYSIS, ROUTINE W REFLEX MICROSCOPIC
Bilirubin Urine: NEGATIVE
Glucose, UA: NEGATIVE mg/dL
Ketones, ur: NEGATIVE mg/dL
Nitrite: NEGATIVE
PROTEIN: 100 mg/dL — AB
RBC / HPF: >50 RBC/hpf — ABNORMAL HIGH (ref 0–5)
SPECIFIC GRAVITY, URINE: 1.019 (ref 1.005–1.030)
pH: 6 (ref 5.0–8.0)

## 2018-02-06 LAB — CBC
HEMATOCRIT: 38.1 % (ref 36.0–46.0)
HEMOGLOBIN: 11.7 g/dL — AB (ref 12.0–15.0)
MCH: 31 pg (ref 26.0–34.0)
MCHC: 30.7 g/dL (ref 30.0–36.0)
MCV: 101.1 fL — AB (ref 80.0–100.0)
Platelets: 194 10*3/uL (ref 150–400)
RBC: 3.77 MIL/uL — ABNORMAL LOW (ref 3.87–5.11)
RDW: 14.4 % (ref 11.5–15.5)
WBC: 8.5 10*3/uL (ref 4.0–10.5)
nRBC: 0 % (ref 0.0–0.2)

## 2018-02-06 LAB — RAPID URINE DRUG SCREEN, HOSP PERFORMED
AMPHETAMINES: NOT DETECTED
BENZODIAZEPINES: NOT DETECTED
Barbiturates: NOT DETECTED
COCAINE: NOT DETECTED
Opiates: NOT DETECTED
TETRAHYDROCANNABINOL: NOT DETECTED

## 2018-02-06 LAB — COMPREHENSIVE METABOLIC PANEL
ALT: 25 U/L (ref 0–44)
ANION GAP: 9 (ref 5–15)
AST: 27 U/L (ref 15–41)
Albumin: 4 g/dL (ref 3.5–5.0)
Alkaline Phosphatase: 69 U/L (ref 38–126)
BILIRUBIN TOTAL: 0.4 mg/dL (ref 0.3–1.2)
BUN: 16 mg/dL (ref 8–23)
CHLORIDE: 102 mmol/L (ref 98–111)
CO2: 26 mmol/L (ref 22–32)
Calcium: 9.1 mg/dL (ref 8.9–10.3)
Creatinine, Ser: 0.81 mg/dL (ref 0.44–1.00)
GFR calc Af Amer: >60 mL/min (ref >60)
Glucose, Bld: 136 mg/dL — ABNORMAL HIGH (ref 70–99)
POTASSIUM: 4.1 mmol/L (ref 3.5–5.1)
Sodium: 137 mmol/L (ref 135–145)
TOTAL PROTEIN: 6.2 g/dL — AB (ref 6.5–8.1)

## 2018-02-06 LAB — DIFFERENTIAL
ABS IMMATURE GRANULOCYTES: 0.03 10*3/uL (ref 0.00–0.07)
Basophils Absolute: 0 10*3/uL (ref 0.0–0.1)
Basophils Relative: 0 %
EOS ABS: 0 10*3/uL (ref 0.0–0.5)
EOS PCT: 1 %
IMMATURE GRANULOCYTES: 0 %
LYMPHS PCT: 8 %
Lymphs Abs: 0.6 10*3/uL — ABNORMAL LOW (ref 0.7–4.0)
Monocytes Absolute: 0.5 10*3/uL (ref 0.1–1.0)
Monocytes Relative: 6 %
Neutro Abs: 7.2 10*3/uL (ref 1.7–7.7)
Neutrophils Relative %: 85 %

## 2018-02-06 LAB — HEMOGLOBIN A1C
Hgb A1c MFr Bld: 5.7 % — ABNORMAL HIGH (ref 4.8–5.6)
Mean Plasma Glucose: 116.89 mg/dL

## 2018-02-06 LAB — BLOOD GAS, ARTERIAL
Acid-Base Excess: 1.1 mmol/L (ref 0.0–2.0)
Bicarbonate: 25.7 mmol/L (ref 20.0–28.0)
FIO2: 60
O2 Saturation: 99.6 %
PCO2 ART: 36.7 mmHg (ref 32.0–48.0)
Patient temperature: 37
pH, Arterial: 7.445 (ref 7.350–7.450)
pO2, Arterial: 244 mmHg — ABNORMAL HIGH (ref 83.0–108.0)

## 2018-02-06 LAB — PROTIME-INR
INR: 0.98
PROTHROMBIN TIME: 12.9 s (ref 11.4–15.2)

## 2018-02-06 LAB — LIPID PANEL
Cholesterol: 149 mg/dL (ref 0–200)
HDL: 75 mg/dL (ref >40)
LDL Cholesterol: 59 mg/dL (ref 0–99)
Total CHOL/HDL Ratio: 2 RATIO
Triglycerides: 76 mg/dL (ref <150)
VLDL: 15 mg/dL (ref 0–40)

## 2018-02-06 LAB — ETHANOL

## 2018-02-06 LAB — SODIUM
Sodium: 138 mmol/L (ref 135–145)
Sodium: 139 mmol/L (ref 135–145)

## 2018-02-06 LAB — TROPONIN I

## 2018-02-06 LAB — GLUCOSE, CAPILLARY: GLUCOSE-CAPILLARY: 117 mg/dL — AB (ref 70–99)

## 2018-02-06 LAB — APTT: aPTT: 24 seconds (ref 24–36)

## 2018-02-06 MED ORDER — SODIUM CHLORIDE 3 % IV SOLN
INTRAVENOUS | Status: DC
  Administered 2018-02-06 – 2018-02-07 (×2): 50 mL/h via INTRAVENOUS
  Filled 2018-02-06 (×3): qty 500

## 2018-02-06 MED ORDER — PANTOPRAZOLE SODIUM 40 MG IV SOLR: 40.0000 mg | Freq: Every day | INTRAVENOUS | Status: DC

## 2018-02-06 MED ORDER — NICARDIPINE HCL IN NACL 20-0.86 MG/200ML-% IV SOLN
INTRAVENOUS | Status: AC
  Filled 2018-02-06: qty 200

## 2018-02-06 MED ORDER — PANTOPRAZOLE SODIUM 40 MG IV SOLR
40.0000 mg | Freq: Every day | INTRAVENOUS | Status: DC
  Administered 2018-02-06 – 2018-02-07 (×2): 40 mg via INTRAVENOUS
  Filled 2018-02-06 (×2): qty 40

## 2018-02-06 MED ORDER — ACETAMINOPHEN 650 MG RE SUPP
650.0000 mg | RECTAL | Status: DC | PRN
  Administered 2018-02-10: 650 mg via RECTAL
  Filled 2018-02-06: qty 1

## 2018-02-06 MED ORDER — NICARDIPINE HCL IN NACL 20-0.86 MG/200ML-% IV SOLN
0.0000 mg/h | INTRAVENOUS | Status: DC
  Administered 2018-02-06: 5 mg/h via INTRAVENOUS

## 2018-02-06 MED ORDER — ORAL CARE MOUTH RINSE
15.0000 mL | OROMUCOSAL | Status: DC
  Administered 2018-02-06 – 2018-02-11 (×41): 15 mL via OROMUCOSAL

## 2018-02-06 MED ORDER — ONDANSETRON HCL 4 MG/2ML IJ SOLN
INTRAMUSCULAR | Status: AC
  Administered 2018-02-06: 4 mg
  Filled 2018-02-06: qty 2

## 2018-02-06 MED ORDER — ACETAMINOPHEN 325 MG PO TABS: 650.0000 mg | ORAL_TABLET | ORAL | Status: DC | PRN

## 2018-02-06 MED ORDER — SENNOSIDES-DOCUSATE SODIUM 8.6-50 MG PO TABS
1.0000 | ORAL_TABLET | Freq: Two times a day (BID) | ORAL | Status: DC
  Administered 2018-02-07: 1 via ORAL
  Filled 2018-02-06: qty 1

## 2018-02-06 MED ORDER — ACETAMINOPHEN 160 MG/5ML PO SOLN
650.0000 mg | ORAL | Status: DC | PRN
  Administered 2018-02-06 – 2018-02-08 (×4): 650 mg
  Filled 2018-02-06 (×4): qty 20.3

## 2018-02-06 MED ORDER — LABETALOL HCL 5 MG/ML IV SOLN
20.0000 mg | Freq: Once | INTRAVENOUS | Status: DC
  Filled 2018-02-06: qty 4

## 2018-02-06 MED ORDER — PROPOFOL 1000 MG/100ML IV EMUL
5.0000 ug/kg/min | INTRAVENOUS | Status: DC
  Administered 2018-02-06: 20 ug/kg/min via INTRAVENOUS
  Administered 2018-02-06: 40 ug/kg/min via INTRAVENOUS
  Filled 2018-02-06 (×2): qty 100

## 2018-02-06 MED ORDER — ETOMIDATE 2 MG/ML IV SOLN
20.0000 mg | Freq: Once | INTRAVENOUS | Status: AC
  Administered 2018-02-06: 20 mg via INTRAVENOUS

## 2018-02-06 MED ORDER — GADOBUTROL 1 MMOL/ML IV SOLN
4.0000 mL | Freq: Once | INTRAVENOUS | Status: AC | PRN
  Administered 2018-02-06: 4 mL via INTRAVENOUS

## 2018-02-06 MED ORDER — CLEVIDIPINE BUTYRATE 0.5 MG/ML IV EMUL: 0.0000 mg/h | INTRAVENOUS | Status: DC

## 2018-02-06 MED ORDER — IPRATROPIUM-ALBUTEROL 0.5-2.5 (3) MG/3ML IN SOLN: 3.0000 mL | Freq: Four times a day (QID) | RESPIRATORY_TRACT | Status: DC | PRN

## 2018-02-06 MED ORDER — STROKE: EARLY STAGES OF RECOVERY BOOK
Freq: Once | Status: DC
  Filled 2018-02-06: qty 1

## 2018-02-06 MED ORDER — ROCURONIUM BROMIDE 50 MG/5ML IV SOLN
50.0000 mg | Freq: Once | INTRAVENOUS | Status: AC
  Administered 2018-02-06: 50 mg via INTRAVENOUS

## 2018-02-06 MED ORDER — CHLORHEXIDINE GLUCONATE 0.12% ORAL RINSE (MEDLINE KIT)
15.0000 mL | Freq: Two times a day (BID) | OROMUCOSAL | Status: DC
  Administered 2018-02-06 – 2018-02-11 (×11): 15 mL via OROMUCOSAL

## 2018-02-06 NOTE — ED Provider Notes (Addendum)
[facility] EMERGENCY DEPARTMENT Provider Note   CSN: [id number] Arrival date & time: 03/01/2018  1112     History   Chief Complaint Chief Complaint  Patient presents with  . Code Stroke    HPI Bridget Ball is a 85 y.o. female.  HPI  The patient is an 85 year old female, she has a known history of COPD, she has some dementia, she has acid reflux and is known to have hyperlipidemia, prior history of a stroke is documented in the medical record as of 2014.  Per the medical record the patient takes aspirin, Plavix and Keppra, when she was admitted to the hospital in September 2019 for strokelike symptoms she had resolved and improved significantly, had an NIH stroke scale of 1 but was deemed not to be a candidate for TPA because of a history of subarachnoid hemorrhage.  Per the paramedics who are the primary historians the patient was singing in the choir at church, she had acute onset of headache nausea vomiting and then left-sided weakness.  On their arrival she had dense left upper extremity hemiparesis, some left-sided facial droop, this has been persistent throughout.  The acute onset of symptoms was about 9:45 AM.  The patient is unable to give me any other information at this time other than stating that she has a headache and nausea.  Past Medical History:  Diagnosis Date  . Anxiety   . Arthritis    "in my back" (06/22/2017)  . Attention to nephrostomy ([place])    PT HAS NEPHROSTOMY TUBE IN PLACE  . Bruises easily   . Chronic diarrhea   . Colitis   . COPD (chronic obstructive pulmonary disease) ([place])   . Dementia ([place])   . Depression   . GERD (gastroesophageal reflux disease)   . Hiatal hernia   . Hx of bronchitis   . Hx of pulmonary edema JULY 2015  . Hx of pulmonary edema JULY 2015  . Hx of septic shock JULY 2015  . Hydronephrosis, left   . Hyperlipidemia   . Memory difficulties   . Microscopic colitis   . Myocardial infarction [facility])    unknown time   . On home  oxygen therapy    "only at night" (06/22/2017)  . Peripheral vascular disease ([place])   . Renal insufficiency   . SAH (subarachnoid hemorrhage) ([place]) 2010  . Seizures ([place])    last seizure was 4-5 years ago with "brain bleed"  . Shortness of breath    with exertion  . Skin cancer    "LE"  . Stroke [facility])    TIA four years ago / stroke NOV 2014    Patient Active Problem List   Diagnosis Date Noted  . PAD (peripheral artery disease) ([place]) 06/22/2017  . Acute respiratory failure with hypoxia ([place]) 02/15/2017  . Leg wound, left 02/15/2017  . History of stroke 10/20/2016  . TIA (transient ischemic attack) 10/12/2016  . Acute urinary retention 10/12/2016  . Popliteal artery aneurysm ([place]) 02/05/2016  . Malnutrition of moderate degree 12/15/2015  . DVT of popliteal vein ([place]) 12/14/2015  . 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 Sepsis affecting skin 12/13/2015  . Dementia ([place]) 12/13/2015  . GERD (gastroesophageal reflux disease) 12/13/2015  . Severe protein-calorie malnutrition ([place]) 12/13/2015  . Cellulitis of right leg 11/24/2015  . Dyslipidemia 11/24/2015  . Erythema of lower extremity 11/24/2015  . Anemia 11/01/2015  . Esophageal reflux 08/17/2013  . COPD with acute exacerbation ([place]) 08/11/2013  .  NSTEMI (non-ST elevated myocardial infarction) type 2 07/26/2013  . Dehydration 07/25/2013  . AKI (acute kidney injury) ([place]) 07/25/2013  . Colitis 07/08/2013  . Hydronephrosis, left 07/08/2013  . Microscopic colitis 06/27/2013  . Memory loss 04/25/2013  . Depression 04/25/2013  . SAH (subarachnoid hemorrhage) ([place]) 11/22/2012  . Seizure disorder secondary to prior [facility] 11/22/2012    Past Surgical History:  Procedure Laterality Date  . ABDOMINAL AORTOGRAM W/LOWER EXTREMITY N/A 06/22/2017   Procedure: ABDOMINAL AORTOGRAM W/LOWER EXTREMITY;  Surgeon: Serafina Mitchell, MD;  Location: [place] CV LAB;  Service: Cardiovascular;  Laterality: N/A;  .  APPENDECTOMY    . BREAST BIOPSY Right 02/02/2014   Procedure: RIGHT BREAST BIOPSY;  Surgeon: Jamesetta So, MD;  Location: [facility] ORS;  Service: General;  Laterality: Right;  . COLONOSCOPY N/A 05/24/2013   Procedure: COLONOSCOPY;  Surgeon: Rogene Houston, MD;  Location: [facility] ENDO SUITE;  Service: Endoscopy;  Laterality: N/A;  100  . CYSTOSCOPY W/ URETERAL STENT PLACEMENT Left 08/06/2015   Procedure: CYSTOSCOPY WITH LEFT URETERAL STENT EXCHANGE - Sammie Bench;  Surgeon: Franchot Gallo, MD;  Location: [facility] ORS;  Service: Urology;  Laterality: Left;  . CYSTOSCOPY W/ URETERAL STENT PLACEMENT Left 07/07/2016   Procedure: CYSTOSCOPY, LEFT RETROGRADE PYELOGRAM WITH LEFT URETERAL STENT REPLACEMENT;  Surgeon: Franchot Gallo, MD;  Location: [facility] ORS;  Service: Urology;  Laterality: Left;  . CYSTOSCOPY W/ URETERAL STENT PLACEMENT Left 07/13/2017   Procedure: CYSTOSCOPY WITH LEFT URETERAL STENT EXCHANGE;  Surgeon: Franchot Gallo, MD;  Location: [facility] ORS;  Service: Urology;  Laterality: Left;  . CYSTOSCOPY WITH RETROGRADE PYELOGRAM, URETEROSCOPY AND STENT PLACEMENT Left 09/18/2013   Procedure: CYSTOSCOPY WITH RETROGRADE PYELOGRAM,  URETEROSCOPY AND  STENT PLACEMENT, removal of nephrostomy tube;  Surgeon: Jorja Loa, MD;  Location: [facility] ORS;  Service: Urology;  Laterality: Left;  . CYSTOSCOPY WITH STENT PLACEMENT Left 09/04/2014   Procedure: CYSTOSCOPY WITH LEFT J2 STENT EXCHANGE;  Surgeon: Franchot Gallo, MD;  Location: [facility] ORS;  Service: Urology;  Laterality: Left;  . ENDOVASCULAR REPAIR OF POPLITEAL ARTERY ANEURYSM  02/05/2016  . ESOPHAGOGASTRODUODENOSCOPY N/A 05/24/2013   Procedure: ESOPHAGOGASTRODUODENOSCOPY (EGD);  Surgeon: Rogene Houston, MD;  Location: [facility];  Service: Endoscopy;  Laterality: N/A;  . FALSE ANEURYSM REPAIR Right 02/05/2016   Procedure: REPAIR RIGHT POLITEAL  ANEURYSM;  Surgeon: Serafina Mitchell, MD;  Location: [place];  Service: Vascular;  Laterality: Right;  . KNEE ARTHROSCOPY Right     . PERIPHERAL VASCULAR INTERVENTION Right 06/22/2017   Procedure: PERIPHERAL VASCULAR INTERVENTION;  Surgeon: Serafina Mitchell, MD;  Location: [place] CV LAB;  Service: Cardiovascular;  Laterality: Right;  . SKIN CANCER EXCISION     "LE"  . TONSILLECTOMY    . TUBAL LIGATION       OB History    Gravida  5   Para  5   Term  5   Preterm      AB      Living  5     SAB      TAB      Ectopic      Multiple      Live Births               Home Medications    Prior to Admission medications   Medication Sig Start Date End Date Taking? Authorizing Provider  acetaminophen (TYLENOL) 325 MG tablet Take 2 tablets (650 mg total) by mouth every 4 (four) hours as needed for mild pain (  or temp > 37.5 C (99.5 F)). 09/25/17   Emokpae, Courage, MD  albuterol (PROVENTIL HFA;VENTOLIN HFA) 108 (90 BASE) MCG/ACT inhaler Inhale 2 puffs into the lungs every 6 (six) hours as needed for wheezing or shortness of breath. 08/03/13   Janece Canterbury, MD  albuterol (PROVENTIL) (2.5 MG/3ML) 0.083% nebulizer solution Take 2.5 mg by nebulization 2 (two) times daily.  11/11/16   [provider]  aspirin 325 MG tablet Take 325 mg by mouth daily.    [provider]  atorvastatin (LIPITOR) 80 MG tablet Take 80 mg by mouth daily at 6 PM.  12/17/16   [provider]  budesonide (ENTOCORT EC) 3 MG 24 hr capsule Take 6 mg by mouth daily as needed (for severe diarrhea).    [provider]  citalopram (CELEXA) 10 MG tablet TAKE ONE (1) TABLET BY MOUTH EVERY DAY 03/04/17   Garvin Fila, MD  clopidogrel (PLAVIX) 75 MG tablet Take 1 tablet (75 mg total) by mouth daily with breakfast. 06/23/17   Dagoberto Ligas, PA-C  docusate sodium (COLACE) 100 MG capsule Take 100 mg by mouth daily.     [provider]  feeding supplement, ENSURE ENLIVE, (ENSURE ENLIVE) LIQD Take 237 mLs by mouth 2 (two) times daily between meals. 12/22/15   Hosie Poisson, MD  ferrous sulfate 325 (65  FE) MG tablet Take 65 mg by mouth daily.     [provider]  levETIRAcetam (KEPPRA) 250 MG tablet Take 1 tablet (250 mg total) by mouth 2 (two) times daily. 06/17/17   Garvin Fila, MD  memantine (NAMENDA XR) 28 MG CP24 24 hr capsule Take 28 mg by mouth daily.    [provider]  pantoprazole (PROTONIX) 40 MG tablet TAKE ONE TABLET (40MG TOTAL) BY MOUTH EVERY OTHER DAY 03/04/17   Rogene Houston, MD  Potassium 99 MG TABS Take 99 mg by mouth daily.    [provider]  TRELEGY ELLIPTA 100-62.5-25 MCG/INH AEPB Inhale 1 puff into the lungs daily.  04/16/17   [provider]  VESICARE 5 MG tablet Take 5 mg by mouth daily.  10/06/16   [provider]    Family History Family History  Problem Relation Age of Onset  . Diabetes Mother   . Stroke Mother   . Heart attack Father     Social History Social History   Tobacco Use  . Smoking status: Former Smoker    Packs/day: 1.00    Years: 50.00    Pack years: 50.00    Types: Cigarettes    Last attempt to quit: 11/13/1997    Years since quitting: 20.2  . Smokeless tobacco: Never Used  Substance Use Topics  . Alcohol use: Yes    Alcohol/week: 2.0 standard drinks    Types: 2 Glasses of wine per week    Comment: 06/22/2017 "2 glasses a night wine; average"  . Drug use: Never     Allergies   Penicillins   Review of Systems Review of Systems  Unable to perform ROS: Acuity of condition     Physical Exam Updated Vital Signs BP (!) 156/68 (BP Location: Left Arm)   Pulse 75   Temp 98.4 F (36.9 C) (Oral)   Resp 19   Ht 1.6 m (_0 )   Wt 47.6 kg   SpO2 100%   BMI 18.60 kg/m   Physical Exam Vitals signs and nursing note reviewed.  Constitutional:      General: She is in  acute distress.     Appearance: She is well-developed. She is ill-appearing.  HENT:     Head: Normocephalic and atraumatic.     Mouth/Throat:     Pharynx: No oropharyngeal exudate.  Eyes:     General: No scleral  icterus.       Right eye: No discharge.        Left eye: No discharge.     Conjunctiva/sclera: Conjunctivae normal.     Pupils: Pupils are equal, round, and reactive to light.  Neck:     Musculoskeletal: Normal range of motion and neck supple.     Thyroid: No thyromegaly.     Vascular: No JVD.  Cardiovascular:     Rate and Rhythm: Normal rate and regular rhythm.     Heart sounds: Normal heart sounds. No murmur. No friction rub. No gallop.   Pulmonary:     Effort: Pulmonary effort is normal. No respiratory distress.     Breath sounds: Normal breath sounds. No wheezing or rales.  Abdominal:     General: Bowel sounds are normal. There is no distension.     Palpations: Abdomen is soft. There is no mass.     Tenderness: There is no abdominal tenderness.  Musculoskeletal: Normal range of motion.        General: No swelling, tenderness or deformity.     Right lower leg: No edema.     Left lower leg: No edema.  Lymphadenopathy:     Cervical: No cervical adenopathy.  Skin:    General: Skin is warm and dry.     Findings: No erythema or rash.  Neurological:     Mental Status: She is alert.     Comments: The patient has slight left-sided facial droop, left upper extremity with significant drift and weakness though she can grip bilaterally.  Left lower extremity with weakness though she can move the ankle knee and hip.  She has some neglect in her left visual field.  Psychiatric:        Behavior: Behavior normal.      ED Treatments / Results  Labs (all labs ordered are listed, but only abnormal results are displayed) Labs Reviewed  CBC - Abnormal; Notable for the following components:      Result Value   RBC 3.77 (*)    Hemoglobin 11.7 (*)    MCV 101.1 (*)    All other components within normal limits  DIFFERENTIAL - Abnormal; Notable for the following components:   Lymphs Abs 0.6 (*)    All other components within normal limits  GLUCOSE, CAPILLARY - Abnormal; Notable for the  following components:   Glucose-Capillary 117 (*)    All other components within normal limits  PROTIME-INR  APTT  ETHANOL  COMPREHENSIVE METABOLIC PANEL  RAPID URINE DRUG SCREEN, HOSP PERFORMED  URINALYSIS, ROUTINE W REFLEX MICROSCOPIC  TROPONIN I    EKG EKG Interpretation  Date/Time:  Sunday February 06 2018 11:32:41 EST Ventricular Rate:  94 PR Interval:    QRS Duration: 100 QT Interval:  388 QTC Calculation: 486 R Axis:   66 Text Interpretation:  Atrial flutter with varied AV block, Anterior infarct, age indeterminate ST elevation, consider inferior injury since last tracing no significant change Confirmed by Noemi Chapel [phone]) on 02/10/2018 2:26:02 PM   Radiology Ct Head Code Stroke Wo Contrast  Result Date: 02/10/2018 CLINICAL DATA:  Code stroke. LEFT arm and LEFT facial weakness. Medication list includes Plavix. EXAM: CT HEAD WITHOUT CONTRAST TECHNIQUE: Contiguous axial  images were obtained from the base of the skull through the vertex without intravenous contrast. COMPARISON:  CT head 09/24/2017. FINDINGS: Brain: Large RIGHT frontal intracerebral hemorrhage, superficially located with extension into the subdural space, both interhemispheric as well as along the convexity. Some extension to the RIGHT lateral ventricle. Estimated measurements are 51 x 68 x 34 mm, corresponding to an estimated volume of 60 mL. The volume of hemorrhage is likely greater however because of the extension outside the brain. Midline shift estimated 9 mm, with slight medial displacement of the RIGHT uncus and brainstem rotation. Overall mild atrophy with small vessel disease is suspected. Vascular: No hyperdense vessel. Skull: Intact. Sinuses/Orbits: No acute finding. Other: None. ASPECTS [facility] Stroke Program Early CT Score) Not applicable. IMPRESSION: Large RIGHT frontal intracerebral hemorrhage, 51 x 68 x 34 mm, with extension into the subdural space both interhemispheric as well as along the  convexity. Also intraventricular extension. Estimated volume 60 mL. Midline shift approximately 9 mm. The etiology of the bleed is likely lobar hypertensive, with potential worsening due to anticoagulation. Critical Value/emergent results were called by telephone at the time of interpretation on 02/17/2018 at 11:30 am to Dr. Noemi Chapel , who verbally acknowledged these results. Electronically Signed   By: Staci Righter M.D.   On: 02/05/2018 11:39    Procedures .Critical Care Performed by: Noemi Chapel, MD Authorized by: Noemi Chapel, MD   Critical care provider statement:    Critical care time (minutes):  35   Critical care time was exclusive of:  Separately billable procedures and treating other patients and teaching time   Critical care was necessary to treat or prevent imminent or life-threatening deterioration of the following conditions:  CNS failure or compromise   Critical care was time spent personally by me on the following activities:  Blood draw for specimens, development of treatment plan with patient or surrogate, discussions with consultants, evaluation of patient's response to treatment, examination of patient, obtaining history from patient or surrogate, ordering and performing treatments and interventions, ordering and review of laboratory studies, ordering and review of radiographic studies, pulse oximetry, re-evaluation of patient's condition and review of old charts Procedure Name: Intubation Date/Time: 02/20/2018 12:02 PM Performed by: Noemi Chapel, MD Pre-anesthesia Checklist: Patient identified, Patient being monitored, Timeout performed, Emergency Drugs available and Suction available Oxygen Delivery Method: Ambu bag Preoxygenation: Pre-oxygenation with 100% oxygen Induction Type: Rapid sequence Ventilation: Mask ventilation without difficulty Laryngoscope Size: Mac and 3 Grade View: Grade II Tube size: 7.5 mm Number of attempts: 1 Airway Equipment and Method:  Stylet Placement Confirmation: ETT inserted through vocal cords under direct vision and CO2 detector Secured at: 20 cm Tube secured with: ETT holder Dental Injury: Teeth and Oropharynx as per pre-operative assessment       (including critical care time)  Medications Ordered in ED Medications  propofol (DIPRIVAN) 1000 MG/100ML infusion (40 mcg/kg/min  47.6 kg Intravenous New Bag/Given 02/17/2018 1152)  nicardipine (CARDENE) 102m in 0.86% saline 207mIV infusion (0.1 mg/ml) (has no administration in time range)  ondansetron (ZOFRAN) 4 MG/2ML injection (4 mg  Given 03/02/2018 1125)  etomidate (AMIDATE) injection 20 mg (20 mg Intravenous Given 02/22/2018 1145)  rocuronium (ZEMURON) injection 50 mg (50 mg Intravenous Given 02/16/2018 1147)     Initial Impression / Assessment and Plan / ED Course  I have reviewed the triage vital signs and the nursing notes.  Pertinent labs & imaging results that were available during my care of the patient were reviewed by me  and considered in my medical decision making (see chart for details).     The patient overall appears to have had an acute stroke.  This may be related to hemorrhage given her prior history of hemorrhage and nausea and vomiting associated with a headache.  CT scan ordered, acute code stroke ordered as well.  Her last seen normal was 945 approximately 1-1/2 hours prior to arrival.  Unfortunately the CT scan shows extensive intracerebral hemorrhage as well as some extension into the subdural space.  The patient is started to have significant left-sided weakness and was vomiting in the CT scanner  The decision was made to intubate the patient after discussion with neurology that she would be more stable for transport if she had a secured airway.  I also discussed the case with Dr. Trenton Gammon of the neurosurgery service who will see the patient on arrival to the emergency department in the ED to ED transfer.  The patient is critically ill, I discussed this  with the family who is aware that she has a potential life-threatening condition and a very guarded to poor prognosis.  The patient will go by critical care transport to [facility].  Dr. Billy Fischer in the emergency department has accepted the patient, Dr. Lorraine Lax with the neurology service will admit to the intensive care unit   Final Clinical Impressions(s) / ED Diagnoses   Final diagnoses:  Hemorrhagic stroke [facility])      Noemi Chapel, MD 03/02/2018 1203    Noemi Chapel, MD 03/01/2018 1426

## 2018-02-06 NOTE — ED Notes (Signed)
7.77mm inserted at this time with 20 at lip with good color change on the detector.

## 2018-02-06 NOTE — ED Notes (Addendum)
Spoke with [facility] paging out truck to take pt ED to ED. Accepting Dr. Rory Percy.

## 2018-02-06 NOTE — H&P (Addendum)
Admission H&P    Chief Complaint: - ICH HPI: Bridget Ball is an 85 y.o. female with a history of COPD, early dementia, Gerd, Hld, CAD with previous MI, pulmonary edema, a previous stroke, SAH, ASPVD, renal insufficiency, anxiety and depression who was transferred from [facility] emergency department today to [facility] for further evaluation of an intracranial hemorrhage.  Most of the history is obtained from the patient's family as well as the records from [facility] since the patient is currently intubated and minimally responsive.    The patient went to church today as usual with her daughter. On the way she complained of a headache behind her eye.  While singing at church at about 9:45 am the patient developed a much more severe headache associated with nausea and vomiting followed by left hemiparesis with a left facial droop. She was brought to the [facility] emergency department where a CT scan of the head revealed a large RIGHT frontal intracerebral hemorrhage, 51 x 68 x 34 mm, with extension into the subdural space both interhemispheric as well as along the convexity. Also intraventricular extension. Estimated volume 60 mL. Midline shift approximately 9 mm.   The patient was intubated to protect her airway and transferred to [facility] where she was evaluated by Dr. Trenton Gammon. The patient's 2 daughters were present. They clarified that their mother was a full code. Dr. Trenton Gammon spoke to them at length and reviewed the images from the CT scan.  He did not feel that surgery would be of benefit at this time. In the emergency department the patient was minimally responsive but was able to raise her right thumb to command. The patient was also seen by Dr. Jaynee Eagles who spoke with the family as well.  Arrangements were made to admit the patient to the neuro intensive care unit for close monitoring and hypertonic saline therapy.  Prior to admission she was on ASA 325 mg  daily, Plavix 75 mg daily, Lipitor 80 mg daily, Keppra 250 mg daily as well as her other medications. She uses O2 at bedtime.  LSN: 2;53 AM on 2/2/20202 tPA Given: no - ICH  Past Medical History Past Medical History:  Diagnosis Date  . Anxiety   . Arthritis    "in my back" (06/22/2017)  . Attention to nephrostomy ([place])    PT HAS NEPHROSTOMY TUBE IN PLACE  . Bruises easily   . Chronic diarrhea   . Colitis   . COPD (chronic obstructive pulmonary disease) ([place])   . Dementia ([place])   . Depression   . GERD (gastroesophageal reflux disease)   . Hiatal hernia   . Hx of bronchitis   . Hx of pulmonary edema JULY 2015  . Hx of pulmonary edema JULY 2015  . Hx of septic shock JULY 2015  . Hydronephrosis, left   . Hyperlipidemia   . Memory difficulties   . Microscopic colitis   . Myocardial infarction [facility])    unknown time   . On home oxygen therapy    "only at night" (06/22/2017)  . Peripheral vascular disease ([place])   . Renal insufficiency   . SAH (subarachnoid hemorrhage) ([place]) 2010  . Seizures ([place])    last seizure was 4-5 years ago with "brain bleed"  . Shortness of breath    with exertion  . Skin cancer    "LE"  . Stroke [facility])    TIA four years ago / stroke NOV 2014  Past Surgical History Past Surgical History:  Procedure Laterality Date  . ABDOMINAL AORTOGRAM W/LOWER EXTREMITY N/A 06/22/2017   Procedure: ABDOMINAL AORTOGRAM W/LOWER EXTREMITY;  Surgeon: Serafina Mitchell, MD;  Location: [place] CV LAB;  Service: Cardiovascular;  Laterality: N/A;  . APPENDECTOMY    . BREAST BIOPSY Right 02/02/2014   Procedure: RIGHT BREAST BIOPSY;  Surgeon: Jamesetta So, MD;  Location: [facility] ORS;  Service: General;  Laterality: Right;  . COLONOSCOPY N/A 05/24/2013   Procedure: COLONOSCOPY;  Surgeon: Rogene Houston, MD;  Location: [facility] ENDO SU[facility];  Service: Endoscopy;  Laterality: N/A;  100  . CYSTOSCOPY W/ URETERAL STENT PLACEMENT Left 08/06/2015   Procedure: CYSTOSCOPY WITH LEFT URETERAL  STENT EXCHANGE - Sammie Bench;  Surgeon: Franchot Gallo, MD;  Location: [facility] ORS;  Service: Urology;  Laterality: Left;  . CYSTOSCOPY W/ URETERAL STENT PLACEMENT Left 07/07/2016   Procedure: CYSTOSCOPY, LEFT RETROGRADE PYELOGRAM WITH LEFT URETERAL STENT REPLACEMENT;  Surgeon: Franchot Gallo, MD;  Location: [facility] OR[facility];  Service: Urology;  Laterality: Left;  . CYSTOSCOPY W/ URETERAL STENT PLACEMENT Left 07/13/2017   Procedure: CYSTOSCOPY WITH LEFT URETERAL STENT EXCHANGE;  Surgeon: Franchot Gallo, MD;  Location: [facility] OR[facility];  Service: Urology;  Laterality: Left;  . CYSTOSCOPY WITH RETROGRADE PYELOGRAM, URETEROSCOPY AND STENT PLACEMENT Left 09/18/2013   Procedure: CYSTOSCOPY WITH RETROGRADE PYELOGRAM,  URETEROSCOPY AND  STENT PLACEMENT, removal of nephrostomy tube;  Surgeon: Jorja Loa, MD;  Location: [facility];  Service: Urology;  Laterality: Left;  . CYSTOSCOPY WITH STENT PLACEMENT Left 09/04/2014   Procedure: CYSTOSCOPY WITH LEFT J2 STENT EXCHANGE;  Surgeon: Franchot Gallo, MD;  Location: [facility] OR[facility];  Service: Urology;  Laterality: Left;  . ENDOVASCULAR REPAIR OF POPLITEAL ARTERY ANEURYSM  02/05/2016  . ESOPHAGOGASTRODUODENOSCOPY N/A 05/24/2013   Procedure: ESOPHAGOGASTRODUODENOSCOPY (EGD);  Surgeon: Rogene Houston, MD;  [facility];  Service: Endoscopy;  Laterality: N/A;  . FALSE ANEURYSM REPAIR Right 02/05/2016   Procedure: REPAIR RIGHT POLITEAL  ANEURYSM;  Surgeon: Serafina Mitchell, MD;  Location: [place];  Service: Vascular;  Laterality: Right;  . KNEE ARTHROSCOPY Right   . PERIPHERAL VASCULAR INTERVENTION Right 06/22/2017   Procedure: PERIPHERAL VASCULAR INTERVENTION;  Surgeon: Serafina Mitchell, MD;  Location: [place] [facility];  Service: Cardiovascular;  Laterality: Right;  . SKIN CANCER EXCISION     "LE"  . TONSILLECTOMY    . TUBAL LIGATION      Family History Family History  Problem Relation Age of Onset  . Diabetes Mother   . Stroke Mother   . Heart attack Father     Social  History  reports that she quit smoking about 20 years ago. Her smoking use included cigarettes. She has a 50.00 pack-year smoking history. She has never used smokeless tobacco. She reports current alcohol use of about 2.0 standard drinks of alcohol per week. She reports that she does not use drugs.  Allergies Allergies  Allergen Reactions  . Penicillins Anaphylaxis    Throat swelling as a child. Tolerated ceftriaxone 07/29/13.  Has patient had a PCN reaction causing immediate rash, facial/tongue/throat swelling, SOB or lightheadedness with hypotension: YES Has patient had a PCN reaction causing severe rash involving mucus membranes or skin necrosis: NO Has patient had a PCN reaction that required hospitalization: Cira Servant Has patient had a PCN reaction occurring within the last 10 years: NO If all of the above answers are "NO", then may proceed with Cephalosporin use    Home Medications (Not in a hospital admission)  Hospital Medications .  stroke: mapping our early stages of recovery book   Does not apply Once  . labetalol  20 mg Intravenous Once  . pantoprazole (PROTONIX) IV  40 mg Intravenous QHS  . senna-docusate  1 tablet Oral BID    ROS: Unobtainable at this time - pt intubated.      Physical Examination:  Vitals:   02/07/2018 1345 02/21/2018 1400 02/12/2018 1415 02/23/2018 1500  BP: 112/61 121/67 113/73 121/86  Pulse: 85   97  Resp: (!) 22 17 15 15   Temp: (!) 97 F (36.1 C) (!) 96.8 F (36 C) (!) 97 F (36.1 C)   TempSrc:      SpO2: 100%   96%  Weight:      Height:         LABORATORY STUDIES   Basic Metabolic Panel: Recent Labs  Lab 02/21/2018 1124  NA 137  K 4.1  CL 102  CO2 26  GLUCOSE 136*  BUN 16  CREATININE 0.81  CALCIUM 9.1    Liver Function Tests: Recent Labs  Lab 02/22/2018 1124  AST 27  ALT 25  ALKPHOS 69  BILITOT 0.4  PROT 6.2*  ALBUMIN 4.0   No results for input(s): LIPASE, AMYLASE in the last 168 hours. No results for input(s): AMMONIA  in the last 168 hours.  CBC: Recent Labs  Lab 03/03/2018 1124  WBC 8.5  NEUTROABS 7.2  HGB 11.7*  HCT 38.1  MCV 101.1*  PLT 194    Cardiac Enzymes: Recent Labs  Lab 03/01/2018 1124  TROPONINI <0.03    BNP: Invalid input(s): POCBNP  CBG: Recent Labs  Lab 02/05/2018 1127  GLUCAP 117*    Microbiology:   Coagulation Studies: Recent Labs    02/05/2018 1124  LABPROT 12.9  INR 0.98    Urinalysis:  Recent Labs  Lab 02/12/2018 1206  COLORURINE YELLOW  LABSPEC 1.019  PHURINE 6.0  GLUCOSEU NEGATIVE  HGBUR LARGE*  BILIRUBINUR NEGATIVE  KETONESUR NEGATIVE  PROTEINUR 100*  NITRITE NEGATIVE  LEUKOCYTESUR LARGE*    Lipid Panel:     Component Value Date/Time   CHOL 159 09/25/2017 0607   TRIG 53 09/25/2017 0607   HDL 72 09/25/2017 0607   CHOLHDL 2.2 09/25/2017 0607   VLDL 11 09/25/2017 0607   LDLCALC 76 09/25/2017 0607    HgbA1C:  Lab Results  Component Value Date   HGBA1C 5.5 09/25/2017    Urine Drug Screen:      Component Value Date/Time   LABOPIA NONE DETECTED 02/16/2018 1206   COCAINSCRNUR NONE DETECTED 02/18/2018 1206   LABBENZ NONE DETECTED 02/24/2018 1206   AMPHETMU NONE DETECTED 02/07/2018 1206   THCU NONE DETECTED 02/09/2018 1206   LABBARB NONE DETECTED 02/07/2018 1206     Alcohol Level:  Recent Labs  Lab 02/22/2018 1124  ETH <10    Miscellaneous Labs:  EKG:  EKG - appears to be SR at 94 BPM. Computer read as atrial flutter. Await cardiology reading.     IMAGING  Dg Chest Port 1 View 02/13/2018 IMPRESSION:  Endotracheal and NG tubes as described. Nodular density at the right lung base is most likely a nipple shadow. Repeat with nipple markers is recommended when feasible.     Ct Head Code Stroke Wo Contrast 02/14/2018 IMPRESSION:  Large RIGHT frontal intracerebral hemorrhage, 51 x 68 x 34 mm, with extension into the subdural space both interhemispheric as well as along the convexity. Also intraventricular extension. Estimated  volume 60 mL. Midline shift  approximately 9 mm. The etiology of the bleed is likely lobar hypertensive, with potential worsening due to anticoagulation.    Transthoracic Echocardiogram pending   Physical and Neurologic exam:   This is a very frail-appearing 85 year old female in no acute distress, intubated, propofol stopped for exam.  Regular rate rhythm, lungs appear clear to auscultation, abdomen is soft, neck is supple, nontraumatic normal head, left pupil is pinpoint and right pupil is 2 to 3 mm both are sluggish, corneal and doll's eyes reflexes are intact but sluggish, she does not blink to threat on either side.  Her eyes are conjugate and midline.  She is not tracking examiner.  She has a left-sided facial droop.  She has intact cough and gag.  She is following commands on the right including wiggle toes and squeeze hands, she is localizing on the right with spontaneous movements.  She withdraws on the left lower extremity with a prominent Babinski, no movement to painful stim on the left upper extremity.  She has normal tone and brisk reflexes throughout.  Assessment: 85 y.o. female with a history of COPD, early dementia, Gerd, Hld, CAD with previous MI, pulmonary edema, a previous stroke, SAH, ASPVD, renal insufficiency, anxiety and depression who today developed a headache, N/V, and left sided weakness. A CT scan revealed a large RIGHT frontal intracerebral hemorrhage, 51 x 68 x 34 mm, with extension into the subdural space both interhemispheric as well as along the convexity. Also intraventricular extension. Estimated volume 60 mL. Midline shift approximately 9 mm. She is a full code. The pt was seen in consultation by Dr Annette Stable who did not feel that surgery was indicated at this time. She will be admitted to the NICU for supportive care and hypertonic saline therapy. Plan to repeat Head CT in AM.   Mikey Bussing PA-C [facility] Pager [phone] 02/23/2018, 3:46  PM   This is an 85 year old female who was seen at [facility] as a code stroke intubated and transferred to [facility].  Work-up at [facility] showed extensive intracerebral hemorrhage as well as some extension into the subdural space and intraventricular space.  Patient was in church when she told daughter she was having a headache and started developing weakness and inability to walk. Patient admitted to Neuro ICU, started on 3%. Discussed with Dr. Trenton Gammon and family today, no current plan for surgery. At 4pm CT worsening, Neurosurgery NP Meghan notified and she will notify Dr. Zada Finders.   Personally examined patient and images, and have participated in and made any corrections needed to history, physical, neuro exam,assessment and plan as stated above.  I have personally obtained the history, evaluated lab date, reviewed imaging studies and agree with radiology interpretations. This patient is critically ill and at significant risk of neurological worsening, death and care requires constant monitoring of vital signs, hemodynamics,respiratory and cardiac monitoring,review of multiple databases, neurological assessment, discussion with family, other specialists and medical decision making of high complexity.I  I spent 60 minutes of neurocritical care time in the care of this patient.  Sarina Ill, MD [facility]

## 2018-02-06 NOTE — ED Notes (Signed)
CODE STROKE PAGED OUT. 

## 2018-02-06 NOTE — ED Notes (Signed)
PT transferred to [place] via RN and EMS

## 2018-02-06 NOTE — ED Notes (Signed)
Patient transported to MRI with RN and Respiratory Therapist

## 2018-02-06 NOTE — ED Notes (Signed)
Spoke with [facility] working on either a bed or ED to ED transfer for pt to go to [facility].

## 2018-02-06 NOTE — Progress Notes (Signed)
CODE STROKE  1109 Call Time  1173 VAPOLI 1030 Exam Started 1120 Exam Finished 1314 Images sent 3888 Completed in [place] called

## 2018-02-06 NOTE — Consult Note (Signed)
NAME:  ISMERAI BIN, MRN:  [id number], DOB:  [birth year], LOS: 0 ADMISSION DATE:  02/13/2018, CONSULTATION DATE:  2/2 REFERRING MD:  Dr Jaynee Eagles, CHIEF COMPLAINT:  VDRF   Brief History   Bridget Ball is 8, history of COPD, coronary disease, previous stroke and SAH.  She was admitted with a sudden onset altered mental status left hemiparesis.  She has a large right ICH.  Now intubated and sedated.  No plans for neurosurgery at this time.  PCCM consulted for ventilator and medical management  History of present illness   85 year old woman with history of COPD, CAD with MI, diastolic CHF, previous stroke with subarachnoid hemorrhage renal insufficiency, early dementia.  She had acute onset headache on 2/2 followed by nausea, left hemiparesis and left facial droop.  In the emergency department at [facility] she was found to have a large right frontal ICH with extension into the subdural and ventricles, 9 mm midline shift.  Patient was intubated to protect her airway and transferred to [facility].  Neurosurgery evaluated the patient and felt that there was no benefit to be had from surgical intervention.  She was brought to the ICU intubated, sedated.  Plans for hypertonic saline, follow neurological status and CT head.  Past Medical History   has a past medical history of Anxiety, Arthritis, Attention to nephrostomy ([place]), Bruises easily, Chronic diarrhea, Colitis, COPD (chronic obstructive pulmonary disease) ([place]), Dementia ([place]), Depression, GERD (gastroesophageal reflux disease), Hiatal hernia, bronchitis, pulmonary edema (JULY 2015), pulmonary edema (JULY 2015), septic shock (JULY 2015), Hydronephrosis, left, Hyperlipidemia, Memory difficulties, Microscopic colitis, Myocardial infarction ([place]), On home oxygen therapy, Peripheral vascular disease ([place]), Renal insufficiency, SAH (subarachnoid hemorrhage) ([place]) (2010), Seizures ([place]), Shortness of breath, Skin cancer, and Stroke ([place]).   Significant Hospital Events      Consults:  NSGY  Procedures:    Significant Diagnostic Tests:  Head CT 2/2>> large right frontal ICH, extension in the subdural space, 9 mm shift MRI brain 2/2>> large right ICH, extension to ventricles, slight progression compared with CT, 14 mm leftward shift, new hemorrhage in the right lateral temporal lobe with petechial characteristics.  No underlying mass noted  Micro Data:    Antimicrobials:    Interim history/subjective:    Objective   Blood pressure (!) 126/94, pulse (!) 110, temperature 99 F (37.2 C), temperature source Axillary, resp. rate (!) 22, height 5\' 3"  (1.6 m), weight 47.6 kg, SpO2 100 %.    Vent Mode: PRVC FiO2 (%):  [50 %-60 %] 50 % Set Rate:  [16 bmp-18 bmp] 16 bmp Vt Set:  [450 mL] 450 mL PEEP:  [5 cmH20] 5 cmH20 Plateau Pressure:  [11 cmH20-12 cmH20] 11 cmH20  No intake or output data in the 24 hours ending 02/27/2018 1720 Filed Weights   03/04/2018 1132  Weight: 47.6 kg    Examination: General: Thin ill-appearing woman, intubated HENT: Sedated ET tube in place, tongue is coated, question some dried blood Lungs: Clear bilaterally no wheeze Cardiovascular: Regular, no murmur Abdomen: Soft, benign, positive bowel sounds Extremities: No edema Neuro: She is sedated, grimace and some withdrawal from pain.  Her right pupil is 4 mm, does not react.  Her left pupil is 3 mm and is sluggishly reactive.  Propofol infusing  Resolved Hospital Problem list     Assessment & Plan:  Acute respiratory failure due to encephalopathy, poor airway protection Ventilation with PRVC 8 cc/kg VAP prevention orders Assess for spontaneous breathing trial if her mental status allows.  Acute  right frontal intracerebral hemorrhage as described above.  Question hypertensive in nature. No indication currently for surgical intervention Repeat head CT as per neurology and neurosurgery plans 3% saline as per neurology recommendations.  Current rate 50 cc/h.  This can be  done peripherally.  If the rate goes above 75 cc/h will place central access Hold Plavix Continue Keppra Sedation with Propofol  Hypertension Cleviprex as per neurology goals  COPD  Bronchodilators   Hx CAD    Best practice:  Diet: NPO Pain/Anxiety/Delirium protocol (if indicated): Propofol VAP protocol (if indicated): yes DVT prophylaxis: SCD GI prophylaxis: PPI Glucose control: initiate if CBG > 180 Mobility: BR Code Status: Full Family Communication:  Disposition:   Labs   CBC: Recent Labs  Lab 02/25/2018 1124  WBC 8.5  NEUTROABS 7.2  HGB 11.7*  HCT 38.1  MCV 101.1*  PLT 628    Basic Metabolic Panel: Recent Labs  Lab 02/16/2018 1124 03/01/2018 1614  NA 137 138  K 4.1  --   CL 102  --   CO2 26  --   GLUCOSE 136*  --   BUN 16  --   CREATININE 0.81  --   CALCIUM 9.1  --    GFR: Estimated Creatinine Clearance: 36.8 mL/min (by C-G formula based on SCr of 0.81 mg/dL). Recent Labs  Lab 02/05/2018 1124  WBC 8.5    Liver Function Tests: Recent Labs  Lab 02/26/2018 1124  AST 27  ALT 25  ALKPHOS 69  BILITOT 0.4  PROT 6.2*  ALBUMIN 4.0   No results for input(s): LIPASE, AMYLASE in the last 168 hours. No results for input(s): AMMONIA in the last 168 hours.  ABG    Component Value Date/Time   PHART 7.445 02/24/2018 1215   PCO2ART 36.7 02/28/2018 1215   PO2ART 244 (H) 02/23/2018 1215   HCO3 25.7 02/10/2018 1215   TCO2 30 09/24/2017 2227   ACIDBASEDEF 3.0 (H) 07/28/2013 2006   O2SAT 99.6 02/10/2018 1215     Coagulation Profile: Recent Labs  Lab 02/10/2018 1124  INR 0.98    Cardiac Enzymes: Recent Labs  Lab 02/19/2018 1124  TROPONINI <0.03    HbA1C: Hgb A1c MFr Bld  Date/Time Value Ref Range Status  02/28/2018 04:14 PM 5.7 (H) 4.8 - 5.6 % Final    Comment:    (NOTE) Pre diabetes:          5.7%-6.4% Diabetes:              >6.4% Glycemic control for   <7.0% adults with diabetes   09/25/2017 06:07 AM 5.5 4.8 - 5.6 % Final    Comment:      (NOTE) Pre diabetes:          5.7%-6.4% Diabetes:              >6.4% Glycemic control for   <7.0% adults with diabetes     CBG: Recent Labs  Lab 02/13/2018 1127  [place]*    Review of Systems:   Unable to give  Past Medical History  She,  has a past medical history of Anxiety, Arthritis, Attention to nephrostomy ([place]), Bruises easily, Chronic diarrhea, Colitis, COPD (chronic obstructive pulmonary disease) ([place]), Dementia ([place]), Depression, GERD (gastroesophageal reflux disease), Hiatal hernia, bronchitis, pulmonary edema (JULY 2015), pulmonary edema (JULY 2015), septic shock (JULY 2015), Hydronephrosis, left, Hyperlipidemia, Memory difficulties, Microscopic colitis, Myocardial infarction ([place]), On home oxygen therapy, Peripheral vascular disease ([place]), Renal insufficiency, SAH (subarachnoid hemorrhage) ([place]) (2010), Seizures ([place]), Shortness  of breath, Skin cancer, and Stroke ([place]).   Surgical History    Past Surgical History:  Procedure Laterality Date  . ABDOMINAL AORTOGRAM W/LOWER EXTREMITY N/A 06/22/2017   Procedure: ABDOMINAL AORTOGRAM W/LOWER EXTREMITY;  Surgeon: Serafina Mitchell, MD;  Location: [place] [facility];  Service: Cardiovascular;  Laterality: N/A;  . APPENDECTOMY    . BREAST BIOPSY Right 02/02/2014   Procedure: RIGHT BREAST BIOPSY;  Surgeon: Jamesetta So, MD;  Location: [facility];  Service: General;  Laterality: Right;  . COLONOSCOPY N/A 05/24/2013   Procedure: COLONOSCOPY;  Surgeon: Rogene Houston, MD;  [facility];  Service: Endoscopy;  Laterality: N/A;  100  . CYSTOSCOPY W/ URETERAL STENT PLACEMENT Left 08/06/2015   Procedure: CYSTOSCOPY WITH LEFT URETERAL STENT EXCHANGE - Sammie Bench;  Surgeon: Franchot Gallo, MD;  Location: [facility];  Service: Urology;  Laterality: Left;  . CYSTOSCOPY W/ URETERAL STENT PLACEMENT Left 07/07/2016   Procedure: CYSTOSCOPY, LEFT RETROGRADE PYELOGRAM WITH LEFT URETERAL STENT REPLACEMENT;  Surgeon: Franchot Gallo, MD;   Location: [facility];  Service: Urology;  Laterality: Left;  . CYSTOSCOPY W/ URETERAL STENT PLACEMENT Left 07/13/2017   Procedure: CYSTOSCOPY WITH LEFT URETERAL STENT EXCHANGE;  Surgeon: Franchot Gallo, MD;  Location: [facility];  Service: Urology;  Laterality: Left;  . CYSTOSCOPY WITH RETROGRADE PYELOGRAM, URETEROSCOPY AND STENT PLACEMENT Left 09/18/2013   Procedure: CYSTOSCOPY WITH RETROGRADE PYELOGRAM,  URETEROSCOPY AND  STENT PLACEMENT, removal of nephrostomy tube;  Surgeon: Jorja Loa, MD;  Location: [facility];  Service: Urology;  Laterality: Left;  . CYSTOSCOPY WITH STENT PLACEMENT Left 09/04/2014   Procedure: CYSTOSCOPY WITH LEFT J2 STENT EXCHANGE;  Surgeon: Franchot Gallo, MD;  Location: [facility];  Service: Urology;  Laterality: Left;  . ENDOVASCULAR REPAIR OF POPLITEAL ARTERY ANEURYSM  02/05/2016  . ESOPHAGOGASTRODUODENOSCOPY N/A 05/24/2013   Procedure: ESOPHAGOGASTRODUODENOSCOPY (EGD);  Surgeon: Rogene Houston, MD;  [facility];  Service: Endoscopy;  Laterality: N/A;  . FALSE ANEURYSM REPAIR Right 02/05/2016   Procedure: REPAIR RIGHT POLITEAL  ANEURYSM;  Surgeon: Serafina Mitchell, MD;  Location: [place] [facility];  Service: Vascular;  Laterality: Right;  . KNEE ARTHROSCOPY Right   . PERIPHERAL VASCULAR INTERVENTION Right 06/22/2017   Procedure: PERIPHERAL VASCULAR INTERVENTION;  Surgeon: Serafina Mitchell, MD;  Location: [place] [facility];  Service: Cardiovascular;  Laterality: Right;  . SKIN CANCER EXCISION     "LE"  . TONSILLECTOMY    . TUBAL LIGATION       Social History   reports that she quit smoking about 20 years ago. Her smoking use included cigarettes. She has a 50.00 pack-year smoking history. She has never used smokeless tobacco. She reports current alcohol use of about 2.0 standard drinks of alcohol per week. She reports that she does not use drugs.   Family History   Her family history includes Diabetes in her mother; Heart attack in her father; Stroke in her mother.    Allergies Allergies  Allergen Reactions  . Penicillins Anaphylaxis    Throat swelling as a child. Tolerated ceftriaxone 07/29/13.  Has patient had a PCN reaction causing immediate rash, facial/tongue/throat swelling, SOB or lightheadedness with hypotension: YES Has patient had a PCN reaction causing severe rash involving mucus membranes or skin necrosis: NO Has patient had a PCN reaction that required hospitalization: Cira Servant Has patient had a PCN reaction occurring within the last 10 years: NO If all of the above answers are "NO", then may proceed with Cephalosporin use  Home Medications  Prior to Admission medications   Medication Sig Start Date End Date Taking? Authorizing Provider  acetaminophen (TYLENOL) 325 MG tablet Take 2 tablets (650 mg total) by mouth every 4 (four) hours as needed for mild pain (or temp > 37.5 C (99.5 F)). 09/25/17  Yes Emokpae, Courage, MD  albuterol (PROVENTIL HFA;VENTOLIN HFA) 108 (90 BASE) MCG/ACT inhaler Inhale 2 puffs into the lungs every 6 (six) hours as needed for wheezing or shortness of breath. 08/03/13  Yes Short, Noah Delaine, MD  albuterol (PROVENTIL) (2.5 MG/3ML) 0.083% nebulizer solution Take 2.5 mg by nebulization as needed for wheezing.  11/11/16  Yes [provider]  aspirin 81 MG tablet Take 80 mg by mouth daily.    Yes [provider]  atorvastatin (LIPITOR) 80 MG tablet Take 80 mg by mouth daily at 6 PM.  12/17/16  Yes [provider]  budesonide (ENTOCORT EC) 3 MG 24 hr capsule Take 6 mg by mouth daily as needed (for severe diarrhea).   Yes [provider]  citalopram (CELEXA) 10 MG tablet TAKE ONE (1) TABLET BY MOUTH EVERY DAY Patient taking differently: Take 10 mg by mouth daily.  03/04/17  Yes Garvin Fila, MD  clopidogrel (PLAVIX) 75 MG tablet Take 1 tablet (75 mg total) by mouth daily with breakfast. 06/23/17  Yes Dagoberto Ligas, PA-C  docusate sodium (COLACE) 100 MG capsule Take 100 mg by mouth daily.     Yes [provider]  feeding supplement, ENSURE ENLIVE, (ENSURE ENLIVE) LIQD Take 237 mLs by mouth 2 (two) times daily between meals. 12/22/15  Yes Hosie Poisson, MD  ferrous sulfate 325 (65 FE) MG tablet Take 65 mg by mouth daily.    Yes [provider]  levETIRAcetam (KEPPRA) 250 MG tablet Take 1 tablet (250 mg total) by mouth 2 (two) times daily. 06/17/17  Yes Garvin Fila, MD  memantine (NAMENDA XR) 28 MG CP24 24 hr capsule Take 28 mg by mouth daily.   Yes [provider]  pantoprazole (PROTONIX) 40 MG tablet TAKE ONE TABLET (40MG  TOTAL) BY MOUTH EVERY OTHER DAY Patient taking differently: Take 40 mg by mouth as needed (heartburn/acid reflux).  03/04/17  Yes Rehman, Mechele Dawley, MD  Potassium 99 MG TABS Take 99 mg by mouth daily.   Yes [provider]  TRELEGY ELLIPTA 100-62.5-25 MCG/INH AEPB Inhale 1 puff into the lungs daily.  04/16/17  Yes [provider]  triamcinolone cream (KENALOG) 0.1 % Apply 1 application topically daily as needed. For dry skin on legs 01/11/18  Yes [provider]     Critical care time: 54      Baltazar Apo, MD, PhD 02/20/2018, 6:16 PM [facility] [phone] or if no answer [phone]

## 2018-02-06 NOTE — ED Notes (Signed)
Report given to [facility] with [facility] at this time. ETA 15 min

## 2018-02-06 NOTE — ED Notes (Signed)
MD evaluating pt for CODE STROKE.

## 2018-02-06 NOTE — ED Notes (Signed)
[facility] with Neuro to Dr. Sabra Heck.

## 2018-02-06 NOTE — ED Notes (Signed)
Teleneuro called at 1121.

## 2018-02-06 NOTE — ED Notes (Signed)
[facility] in route to pick up pt. PT going ED to ED.

## 2018-02-06 NOTE — Progress Notes (Signed)
I came to do the echo but the patient was in MRI.

## 2018-02-06 NOTE — ED Notes (Signed)
[facility] arrived to ED 

## 2018-02-06 NOTE — ED Triage Notes (Signed)
EMS reports pt was at church today and started having left sided hemiparesis, headache around 0945. PT was taken to CT scan at 1113 by nursing staff and EMS staff. PT had EDP eval at 1110 when she arrived. [facility] paged and then cancelled at 1122 due to CT scan showing positive hemorragic stroke with a shift per EDP Dr. Sabra Heck. Daughter arrived at 47 and Dr. Sabra Heck is talking to the daughter at this time in the family room. PT is alert and oriented. PT has dry mucous membranes.

## 2018-02-06 NOTE — Consult Note (Signed)
Reason for Consult: Intracerebral hemorrhage Referring Physician: Neurology  Bridget Ball is an 85 y.o. female.  HPI: 85 year old female with prior history of stroke and history of early dementia.  Presents for evaluation of right frontal intracerebral hemorrhage.  Patient developed retro-orbital headache earlier today with progressive left-sided weakness and declining level of consciousness.  Patient evaluated in outside emergency department where a large right frontal lobar hemorrhage with some associated subdural blood was demonstrated.  Patient on chronic Plavix.  No other anticoagulation.  Patient has been hemodynamically stable.  She was intubated for airway protection.  The patient has been living semi-independently with her daughter.  Past Medical History:  Diagnosis Date  . Anxiety   . Arthritis    "in my back" (06/22/2017)  . Attention to nephrostomy ([place])    PT HAS NEPHROSTOMY TUBE IN PLACE  . Bruises easily   . Chronic diarrhea   . Colitis   . COPD (chronic obstructive pulmonary disease) ([place])   . Dementia ([place])   . Depression   . GERD (gastroesophageal reflux disease)   . Hiatal hernia   . Hx of bronchitis   . Hx of pulmonary edema JULY 2015  . Hx of pulmonary edema JULY 2015  . Hx of septic shock JULY 2015  . Hydronephrosis, left   . Hyperlipidemia   . Memory difficulties   . Microscopic colitis   . Myocardial infarction [facility])    unknown time   . On home oxygen therapy    "only at night" (06/22/2017)  . Peripheral vascular disease ([place])   . Renal insufficiency   . SAH (subarachnoid hemorrhage) ([place]) 2010  . Seizures ([place])    last seizure was 4-5 years ago with "brain bleed"  . Shortness of breath    with exertion  . Skin cancer    "LE"  . Stroke [facility])    TIA four years ago / stroke NOV 2014    Past Surgical History:  Procedure Laterality Date  . ABDOMINAL AORTOGRAM W/LOWER EXTREMITY N/A 06/22/2017   Procedure: ABDOMINAL AORTOGRAM W/LOWER EXTREMITY;   Surgeon: Serafina Mitchell, MD;  Location: [facility] [facility];  Service: Cardiovascular;  Laterality: N/A;  . APPENDECTOMY    . BREAST BIOPSY Right 02/02/2014   Procedure: RIGHT BREAST BIOPSY;  Surgeon: Jamesetta So, MD;  Location: [facility];  Service: General;  Laterality: Right;  . COLONOSCOPY N/A 05/24/2013   Procedure: COLONOSCOPY;  Surgeon: Rogene Houston, MD;  Location: [facility];  Service: Endoscopy;  Laterality: N/A;  100  . CYSTOSCOPY W/ URETERAL STENT PLACEMENT Left 08/06/2015   Procedure: CYSTOSCOPY WITH LEFT URETERAL STENT EXCHANGE - Sammie Bench;  Surgeon: Franchot Gallo, MD;  Location: [facility];  Service: Urology;  Laterality: Left;  . CYSTOSCOPY W/ URETERAL STENT PLACEMENT Left 07/07/2016   Procedure: CYSTOSCOPY, LEFT RETROGRADE PYELOGRAM WITH LEFT URETERAL STENT REPLACEMENT;  Surgeon: Franchot Gallo, MD;  Location: [facility];  Service: Urology;  Laterality: Left;  . CYSTOSCOPY W/ URETERAL STENT PLACEMENT Left 07/13/2017   Procedure: CYSTOSCOPY WITH LEFT URETERAL STENT EXCHANGE;  Surgeon: Franchot Gallo, MD;  Location: [facility];  Service: Urology;  Laterality: Left;  . CYSTOSCOPY WITH RETROGRADE PYELOGRAM, URETEROSCOPY AND STENT PLACEMENT Left 09/18/2013   Procedure: CYSTOSCOPY WITH RETROGRADE PYELOGRAM,  URETEROSCOPY AND  STENT PLACEMENT, removal of nephrostomy tube;  Surgeon: Jorja Loa, MD;  Location: [facility];  Service: Urology;  Laterality: Left;  . CYSTOSCOPY WITH STENT PLACEMENT Left 09/04/2014   Procedure: CYSTOSCOPY WITH LEFT J2  STENT EXCHANGE;  Surgeon: Franchot Gallo, MD;  Location: [facility];  Service: Urology;  Laterality: Left;  . ENDOVASCULAR REPAIR OF POPLITEAL ARTERY ANEURYSM  02/05/2016  . ESOPHAGOGASTRODUODENOSCOPY N/A 05/24/2013   Procedure: ESOPHAGOGASTRODUODENOSCOPY (EGD);  Surgeon: Rogene Houston, MD;  Location[facility];  Service: Endoscopy;  Laterality: N/A;  . FALSE ANEURYSM REPAIR Right 02/05/2016   Procedure: REPAIR RIGHT POLITEAL  ANEURYSM;   Surgeon: Serafina Mitchell, MD;  Location: [facility];  Service: Vascular;  Laterality: Right;  . KNEE ARTHROSCOPY Right   . PERIPHERAL VASCULAR INTERVENTION Right 06/22/2017   Procedure: PERIPHERAL VASCULAR INTERVENTION;  Surgeon: Serafina Mitchell, MD;  Location: [facility];  Service: Cardiovascular;  Laterality: Right;  . SKIN CANCER EXCISION     "LE"  . TONSILLECTOMY    . TUBAL LIGATION      Family History  Problem Relation Age of Onset  . Diabetes Mother   . Stroke Mother   . Heart attack Father     Social History:  reports that she quit smoking about 20 years ago. Her smoking use included cigarettes. She has a 50.00 pack-year smoking history. She has never used smokeless tobacco. She reports current alcohol use of about 2.0 standard drinks of alcohol per week. She reports that she does not use drugs.  Allergies:  Allergies  Allergen Reactions  . Penicillins Anaphylaxis    Throat swelling as a child. Tolerated ceftriaxone 07/29/13.  Has patient had a PCN reaction causing immediate rash, facial/tongue/throat swelling, SOB or lightheadedness with hypotension: YES Has patient had a PCN reaction causing severe rash involving mucus membranes or skin necrosis: NO Has patient had a PCN reaction that required hospitalization: Cira Servant Has patient had a PCN reaction occurring within the last 10 years: NO If all of the above answers are "NO", then may proceed with Cephalosporin use    Medications: I have reviewed the patient's current medications.  Results for orders placed or performed during the hospital encounter of 03/01/2018 (from the past 48 hour(s))  Ethanol     Status: None   Collection Time: 02/15/2018 11:24 AM  Result Value Ref Range   Alcohol, Ethyl (B) <10 <10 mg/dL    Comment: (NOTE) Lowest detectable limit for serum alcohol is 10 mg/dL. For medical purposes only. Performed at [facility], [address], [place], [place] [address]   Protime-INR     Status: None   Collection  Time: 02/15/2018 11:24 AM  Result Value Ref Range   Prothrombin Time 12.9 11.4 - 15.2 seconds   INR 0.98     Comment: Performed at [facility], [address], [place], [place] [address]  APTT     Status: None   Collection Time: 02/24/2018 11:24 AM  Result Value Ref Range   aPTT 24 24 - 36 seconds    Comment: Performed at [facility], [address] [place], [place] [address]  CBC     Status: Abnormal   Collection Time: 02/18/2018 11:24 AM  Result Value Ref Range   WBC 8.5 4.0 - 10.5 K/uL   RBC 3.77 (L) 3.87 - 5.11 MIL/uL   Hemoglobin 11.7 (L) 12.0 - 15.0 g/dL   HCT 38.1 36.0 - 46.0 %   MCV 101.1 (H) 80.0 - 100.0 fL   MCH 31.0 26.0 - 34.0 pg   MCHC 30.7 30.0 - 36.0 g/dL   RDW 14.4 11.5 - 15.5 %   Platelets 194 150 - 400 K/uL   nRBC 0.0 0.0 -  0.2 %    Comment: Performed at [facility], [address] [place], [place] [address]  Differential     Status: Abnormal   Collection Time: 02/07/2018 11:24 AM  Result Value Ref Range   Neutrophils Relative % 85 %   Neutro Abs 7.2 1.7 - 7.7 K/uL   Lymphocytes Relative 8 %   Lymphs Abs 0.6 (L) 0.7 - 4.0 K/uL   Monocytes Relative 6 %   Monocytes Absolute 0.5 0.1 - 1.0 K/uL   Eosinophils Relative 1 %   Eosinophils Absolute 0.0 0.0 - 0.5 K/uL   Basophils Relative 0 %   Basophils Absolute 0.0 0.0 - 0.1 K/uL   Immature Granulocytes 0 %   Abs Immature Granulocytes 0.03 0.00 - 0.07 K/uL    Comment: Performed at [facility], [address], [place], [place] [address]  Comprehensive metabolic panel     Status: Abnormal   Collection Time: 02/25/2018 11:24 AM  Result Value Ref Range   Sodium 137 135 - 145 mmol/L   Potassium 4.1 3.5 - 5.1 mmol/L   Chloride 102 98 - 111 mmol/L   CO2 26 22 - 32 mmol/L   Glucose, Bld 136 (H) 70 - 99 mg/dL   BUN 16 8 - 23 mg/dL   Creatinine, Ser 0.81 0.44 - 1.00 mg/dL   Calcium 9.1 8.9 - 10.3 mg/dL   Total Protein 6.2 (L) 6.5 - 8.1 g/dL   Albumin 4.0 3.5 - 5.0 g/dL   AST 27 15 - 41 U/L   ALT 25 0 - 44 U/L   Alkaline  Phosphatase 69 38 - 126 U/L   Total Bilirubin 0.4 0.3 - 1.2 mg/dL   GFR calc non Af Amer >60 >60 mL/min   GFR calc Af Amer >60 >60 mL/min   Anion gap 9 5 - 15    Comment: Performed at [facility] [place], [address] [place], [place] [address]  Troponin I - ONCE - STAT     Status: None   Collection Time: 02/19/2018 11:24 AM  Result Value Ref Range   Troponin I <0.03 <0.03 ng/mL    Comment: Performed at [facility], [address] [place], [place] [address]  Glucose, capillary     Status: Abnormal   Collection Time: 02/19/2018 11:27 AM  Result Value Ref Range   Glucose-Capillary 117 (H) 70 - 99 mg/dL  Urine rapid drug screen (hosp performed)     Status: None   Collection Time: 02/13/2018 12:06 PM  Result Value Ref Range   Opiates NONE DETECTED NONE DETECTED   Cocaine NONE DETECTED NONE DETECTED   Benzodiazepines NONE DETECTED NONE DETECTED   Amphetamines NONE DETECTED NONE DETECTED   Tetrahydrocannabinol NONE DETECTED NONE DETECTED   Barbiturates NONE DETECTED NONE DETECTED    Comment: (NOTE) DRUG SCREEN FOR MEDICAL PURPOSES ONLY.  IF CONFIRMATION IS NEEDED FOR ANY PURPOSE, NOTIFY LAB WITHIN 5 DAYS. LOWEST DETECTABLE LIMITS FOR URINE DRUG SCREEN Drug Class                     Cutoff (ng/mL) Amphetamine and metabolites    1000 Barbiturate and metabolites    200 Benzodiazepine                 240 Tricyclics and metabolites     300 Opiates and metabolites        300 Cocaine and metabolites        300 THC  50 Performed at [facility], [address], [place], [place] [address]   Urinalysis, Routine w reflex microscopic     Status: Abnormal   Collection Time: 02/05/2018 12:06 PM  Result Value Ref Range   Color, Urine YELLOW YELLOW   APPearance HAZY (A) CLEAR   Specific Gravity, Urine 1.019 1.005 - 1.030   pH 6.0 5.0 - 8.0   Glucose, UA NEGATIVE NEGATIVE mg/dL   Hgb urine dipstick LARGE (A) NEGATIVE   Bilirubin Urine NEGATIVE NEGATIVE   Ketones, ur NEGATIVE  NEGATIVE mg/dL   Protein, ur 100 (A) NEGATIVE mg/dL   Nitrite NEGATIVE NEGATIVE   Leukocytes, UA LARGE (A) NEGATIVE   RBC / HPF >50 (H) 0 - 5 RBC/hpf   WBC, UA >50 (H) 0 - 5 WBC/hpf   Bacteria, UA RARE (A) NONE SEEN   Squamous Epithelial / LPF 0-5 0 - 5   Mucus PRESENT     Comment: Performed at [facility], [address], [place], [place] [address]  Blood gas, arterial     Status: Abnormal   Collection Time: 02/28/2018 12:15 PM  Result Value Ref Range   FIO2 60.00    Delivery systems VENTILATOR    pH, Arterial 7.445 7.350 - 7.450   pCO2 arterial 36.7 32.0 - 48.0 mmHg   pO2, Arterial 244 (H) 83.0 - 108.0 mmHg   Bicarbonate 25.7 20.0 - 28.0 mmol/L   Acid-Base Excess 1.1 0.0 - 2.0 mmol/L   O2 Saturation 99.6 %   Patient temperature 37.0    Allens test (pass/fail) BRACHIAL ARTERY (A) PASS    Comment: Performed at [facility], [address], [place], [place] [address]    Dg Chest Port 1 View  Result Date: 02/19/2018 CLINICAL DATA:  Code stroke EXAM: PORTABLE CHEST 1 VIEW COMPARISON:  09/25/2017 FINDINGS: Endotracheal tube is in place. Tip is 5.3 cm from the carina. NG tube tip is beyond the fundus of the stomach. Nodular density at the lateral right lung base is most likely a nipple shadow. Lungs are otherwise hyperaerated and clear. No pneumothorax. No pleural effusion. IMPRESSION: Endotracheal and NG tubes as described. Nodular density at the right lung base is most likely a nipple shadow. Repeat with nipple markers is recommended when feasible. Electronically Signed   By: Marybelle Killings M.D.   On: 02/15/2018 12:24   Ct Head Code Stroke Wo Contrast  Result Date: 02/25/2018 CLINICAL DATA:  Code stroke. LEFT arm and LEFT facial weakness. Medication list includes Plavix. EXAM: CT HEAD WITHOUT CONTRAST TECHNIQUE: Contiguous axial images were obtained from the base of the skull through the vertex without intravenous contrast. COMPARISON:  CT head 09/24/2017. FINDINGS: Brain: Large RIGHT frontal  intracerebral hemorrhage, superficially located with extension into the subdural space, both interhemispheric as well as along the convexity. Some extension to the RIGHT lateral ventricle. Estimated measurements are 51 x 68 x 34 mm, corresponding to an estimated volume of 60 mL. The volume of hemorrhage is likely greater however because of the extension outside the brain. Midline shift estimated 9 mm, with slight medial displacement of the RIGHT uncus and brainstem rotation. Overall mild atrophy with small vessel disease is suspected. Vascular: No hyperdense vessel. Skull: Intact. Sinuses/Orbits: No acute finding. Other: None. ASPECTS [facility] Stroke Program Early CT Score) Not applicable. IMPRESSION: Large RIGHT frontal intracerebral hemorrhage, 51 x 68 x 34 mm, with extension into the subdural space both interhemispheric as well as along the convexity. Also intraventricular extension. Estimated volume 60 mL. Midline shift  approximately 9 mm. The etiology of the bleed is likely lobar hypertensive, with potential worsening due to anticoagulation. Critical Value/emergent results were called by telephone at the time of interpretation on 02/17/2018 at 11:30 am to Dr. Noemi Chapel , who verbally acknowledged these results. Electronically Signed   By: Staci Righter M.D.   On: 02/05/2018 11:39    Review of systems not obtained due to patient factors. Blood pressure 121/67, pulse 85, temperature (!) 96.8 F (36 C), resp. rate 17, height 5\' 3"  (1.6 m), weight 47.6 kg, SpO2 100 %. The patient is intubated.  She opens her eyes to voice.  She appears aware.  Pupils are pinpoint on the left and 3 mm and reactive on the right.  Her gaze is conjugate.  Corneal reflexes present bilaterally.  She follows commands with her right upper extremity and both lower extremities.  She has weakness of her left upper extremity.  Examination head ears eyes and throat demonstrates no evidence of trauma.  Cervical spine without bony  abnormality.  Airway normal.  Chest and abdomen benign.  Overall appearance is elderly and frail.  Assessment/Plan: Patient with a large right frontal lobar hemorrhage with some secondary extension into her subdural space.  Basilar cisterns are open.  She has some right-to-left shift.  Given her advanced age and relatively good neurological exam at present I would not recommend surgical intervention at present.  I recommend ICU observation.  I agree with hypertonic saline.  Plan on follow-up head CT scan in morning.  Mallie Mussel A Sopheap Basic 03/02/2018, 2:20 PM

## 2018-02-06 NOTE — ED Notes (Signed)
Pt's daughter signed paperwork for pt to be transferred. Daughter signed in the area of "witness" instead of "signature of responsible person". Meagan White, RN was witness to the signature.

## 2018-02-06 NOTE — Progress Notes (Signed)
I came back but the heart rate was too high.

## 2018-02-06 NOTE — ED Notes (Signed)
Spoke with [facility] will page neurologist on call.

## 2018-02-06 NOTE — ED Provider Notes (Signed)
MSE was initiated and I personally evaluated the patient and placed orders (if any) at  1:23 PM on February 06, 2018.  The patient appears stable so that the remainder of the MSE may be completed by another provider.  Patient arrives from [facility] as a code stroke last seen normal 9:45 AM.  Work-up there shows extensive intracerebral hemorrhage as well as some extension into the subdural space.  The patient was intubated and deemed stable for transfer.  Dr. Jaynee Eagles with neurology at the bedside presently evaluating patient, plan to admit to the neuro ICU.  Dr. Trenton Gammon of neurosurgery is aware of the patient and will evaluate her emergently in the ED. patient presently intubated, sedated on a propofol gtt. She is a full code.   Renita Papa, PA-C 02/27/2018 1325    Gareth Morgan, MD 02/07/18 [phone]

## 2018-02-07 ENCOUNTER — Inpatient Hospital Stay (HOSPITAL_COMMUNITY): Payer: Medicare Other

## 2018-02-07 DIAGNOSIS — I611 Nontraumatic intracerebral hemorrhage in hemisphere, cortical: Principal | ICD-10-CM

## 2018-02-07 DIAGNOSIS — I361 Nonrheumatic tricuspid (valve) insufficiency: Secondary | ICD-10-CM

## 2018-02-07 LAB — ECHOCARDIOGRAM COMPLETE
Height: 63 in
Weight: 1680 oz

## 2018-02-07 LAB — CBC
HCT: 32.3 % — ABNORMAL LOW (ref 36.0–46.0)
Hemoglobin: 10.3 g/dL — ABNORMAL LOW (ref 12.0–15.0)
MCH: 31.8 pg (ref 26.0–34.0)
MCHC: 31.9 g/dL (ref 30.0–36.0)
MCV: 99.7 fL (ref 80.0–100.0)
Platelets: 173 10*3/uL (ref 150–400)
RBC: 3.24 MIL/uL — ABNORMAL LOW (ref 3.87–5.11)
RDW: 14.3 % (ref 11.5–15.5)
WBC: 10 10*3/uL (ref 4.0–10.5)
nRBC: 0 % (ref 0.0–0.2)

## 2018-02-07 LAB — BASIC METABOLIC PANEL
Anion gap: 8 (ref 5–15)
BUN: 11 mg/dL (ref 8–23)
CO2: 26 mmol/L (ref 22–32)
Calcium: 8.7 mg/dL — ABNORMAL LOW (ref 8.9–10.3)
Chloride: 110 mmol/L (ref 98–111)
Creatinine, Ser: 0.83 mg/dL (ref 0.44–1.00)
GFR calc Af Amer: >60 mL/min (ref >60)
Glucose, Bld: 132 mg/dL — ABNORMAL HIGH (ref 70–99)
Potassium: 4.1 mmol/L (ref 3.5–5.1)
Sodium: 144 mmol/L (ref 135–145)

## 2018-02-07 LAB — SODIUM
SODIUM: 158 mmol/L — AB (ref 135–145)
Sodium: 148 mmol/L — ABNORMAL HIGH (ref 135–145)
Sodium: 154 mmol/L — ABNORMAL HIGH (ref 135–145)

## 2018-02-07 MED ORDER — SODIUM CHLORIDE 3 % IV SOLN
INTRAVENOUS | Status: DC
  Administered 2018-02-07 – 2018-02-08 (×4): 75 mL/h via INTRAVENOUS
  Filled 2018-02-07 (×6): qty 500

## 2018-02-07 MED ORDER — SODIUM CHLORIDE 0.9 % IV SOLN
250.0000 mg | Freq: Two times a day (BID) | INTRAVENOUS | Status: DC
  Administered 2018-02-07 (×2): 250 mg via INTRAVENOUS
  Filled 2018-02-07 (×5): qty 2.5

## 2018-02-07 MED ORDER — SODIUM CHLORIDE 3 % IV SOLN
INTRAVENOUS | Status: DC
  Filled 2018-02-07: qty 500

## 2018-02-07 NOTE — Progress Notes (Addendum)
OT Cancellation Note  Patient Details Name: Bridget Ball MRN: [id number] DOB: 1928/04/16   Cancelled Treatment:    Reason Eval/Treat Not Completed: Patient not medically ready;Active bedrest order. Intubated.  Will follow to initiate OT eval when appropriate and able.  Delight Stare, OT Acute Rehabilitation Services Pager [phone] Office [place] 02/07/2018, 9:02 AM

## 2018-02-07 NOTE — Progress Notes (Signed)
SLP Cancellation Note  Patient Details Name: Bridget Ball MRN: [id number] DOB: [birth year]   Cancelled treatment:       Reason Eval/Treat Not Completed: Medical issues which prohibited therapy Intubated. Will follow.   [place] Siren 02/07/2018, 8:04 AM   Orbie Pyo Colvin Caroli.Ed Risk analyst [phone] Office [phone]

## 2018-02-07 NOTE — Progress Notes (Signed)
PT Cancellation Note  Patient Details Name: TEKA CHANDA MRN: [id number] DOB: 12-30-1928   Cancelled Treatment:    Reason Eval/Treat Not Completed: Patient not medically ready(pt on vent with active bedrest order)   Sandy Salaam Daylene Vandenbosch 02/07/2018, 7:15 AM  [place] Pager: [phone] Office: [phone]

## 2018-02-07 NOTE — Progress Notes (Signed)
Patient currently on 3% gtt since yesterday @ 1445. Rate increased to 75 ml/hr via order from Dr Leonie Man. Patient has PIV that 3% gtt is infusing through. I spoke with Dr Leonie Man regarding the need for a central line after 3% gtt has been infusing for >24hrs. At this time he would like to continue 3% gtt through a PIV until family discussion is done. Will continue to monitor and will reassess.

## 2018-02-07 NOTE — Progress Notes (Signed)
Notified Dr Leonie Man that patient's family had decided to do a one way extubation tomorrow morning once all of the family arrives. Will continue to monitor patient at this time.

## 2018-02-07 NOTE — Progress Notes (Signed)
Family at bedside when assessing patient for PIV.No suitable vein ,pt is bruise all over ,very thin skin both upper extremity.Rn aware.

## 2018-02-07 NOTE — Progress Notes (Signed)
NAME:  Bridget Ball, MRN:  [id number], DOB:  October 21, 1928, LOS: 1 ADMISSION DATE:  03/02/2018, CONSULTATION DATE:  2/2 REFERRING MD:  Dr Jaynee Eagles, CHIEF COMPLAINT:  VDRF   Brief History   Bridget Ball is 89, history of COPD, coronary disease, previous stroke and SAH.  She was admitted 2/2 with a sudden onset altered mental status left hemiparesis.  She has a large right ICH.  Now intubated and sedated.  Started on 3% NS and no plans for neurosurgery at this time.  PCCM consulted for ventilator and medical management   Significant Hospital Events   2/2 > admit.  Consults:  NSGY PCCM  Procedures:  ETT 2/2 >   Significant Diagnostic Tests:  Head CT 2/2>> large right frontal ICH, extension in the subdural space, 9 mm shift MRI brain 2/2>> large right ICH, extension to ventricles, slight progression compared with CT, 14 mm leftward shift, new hemorrhage in the right lateral temporal lobe with petechial characteristics.  No underlying mass noted CTH 2/3 > increased IPH with swelling in right temporal lobe.  Unchanged large IPH in right frontal lobe.  No progression of SDH, unchanged 59mm MLS with subfalcine herniation. Echo 2/3 >   Micro Data:  None.  Antimicrobials:  None.  Interim history/subjective:  Follows intermittent commands on right.  Objective   Blood pressure (!) 128/95, pulse 86, temperature 99.7 F (37.6 C), resp. rate 16, height 5\' 3"  (1.6 m), weight 47.6 kg, SpO2 100 %.    Vent Mode: PRVC FiO2 (%):  [40 %-60 %] 40 % Set Rate:  [16 bmp-18 bmp] 16 bmp Vt Set:  [450 mL] 450 mL PEEP:  [5 cmH20] 5 cmH20 Plateau Pressure:  [11 cmH20-12 cmH20] 12 cmH20   Intake/Output Summary (Last 24 hours) at 02/07/2018 0934 Last data filed at 02/07/2018 0800 Gross per 24 hour  Intake 834.02 ml  Output 800 ml  Net 34.02 ml   Filed Weights   02/10/2018 1132  Weight: 47.6 kg    Examination:  General: Thin chronically ill-appearing woman, intubated HENT: [place]/AT. ETT in place. Lungs:  Clear bilaterally no wheeze. Cardiovascular: Regular, no murmur. Abdomen: Soft, benign, positive bowel sounds. Extremities: No edema. Neuro:  Sedated but follows some intermittent basic commands on right.   Assessment & Plan:   Acute respiratory failure due to encephalopathy and poor airway protection in setting IPH, Continue full ventilatory support with PRVC 8 cc/kg. VAP prevention orders. Assess for spontaneous breathing trial if her mental status improves; though, per neuro, mental status likely to worsen over next few days. Likely needs one way extubation and comfort care (neuro to discuss with family this afternoon 2/3).  Acute right frontal intracerebral hemorrhage as described above.  Question hypertensive in nature. No indication currently for surgical intervention. Repeat imaging as per neurology and neurosurgery plans. Continue 3% saline as per neurology recommendations (Current rate 50 cc/hr but increasing to 75 per neuro). Hold Plavix. Continue preadmission keppra especially in light new bleed (dose at 500mg  BID vs home dose of 250).  Hypertension - improved after cleviprex. Monitor off cleviprex.  COPD . Continue bronchodilators.   Hx CAD. Hold preadmission ASA, plavix.   Best practice:  Diet: NPO Pain/Anxiety/Delirium protocol (if indicated): Propofol VAP protocol (if indicated): yes DVT prophylaxis: SCD GI prophylaxis: PPI Glucose control: initiate if CBG > 180 Mobility: BR Code Status: Full Family Communication:  None available on AM rounds 2/3.  Per Dr. Leonie Man, he will discuss one way extubation / comfort care with  family this afternoon 2/3. Disposition: ICU.   Critical care time: 30 min.    Montey Hora, [facility] Pager: [phone].  If no answer, [phone] 02/07/2018, 9:52 AM

## 2018-02-07 NOTE — Progress Notes (Signed)
This note also relates to the following rows which could not be included: SpO2 - Cannot attach notes to unvalidated device data  Pt transported to and from CT without event. RT will continue to monitor.

## 2018-02-07 NOTE — Progress Notes (Signed)
  Echocardiogram 2D Echocardiogram has been performed.  Bridget Ball 02/07/2018, 10:23 AM

## 2018-02-07 NOTE — Progress Notes (Addendum)
STROKE TEAM PROGRESS NOTE   INTERVAL HISTORY Her son is at the bedside.  Dr. Leonie Man discussed condition with him. They discussed stroke, poor prognosis, what her thoughts were on life support and the fact that she has a living well. He has other siblings that will want to be involved in the discussion and decision - there are 5 siblings. His youngest sister is POA. Dr. Leonie Man to chaeck back in on them this afternoon.  Vitals:   02/07/18 0645 02/07/18 0700 02/07/18 0800 02/07/18 0805  BP: 113/62 (!) 105/58 (!) 128/95 (!) 128/95  Pulse: 66 64 87 86  Resp: 16 16 18 16   Temp: 100 F (37.8 C) 100 F (37.8 C) 99.9 F (37.7 C) 99.7 F (37.6 C)  TempSrc:      SpO2: 100% 100% 100% 100%  Weight:      Height:        CBC:  Recent Labs  Lab 02/07/2018 1124 02/07/18 0127  WBC 8.5 10.0  NEUTROABS 7.2  --   HGB 11.7* 10.3*  HCT 38.1 32.3*  MCV 101.1* 99.7  PLT 194 062    Basic Metabolic Panel:  Recent Labs  Lab 02/16/2018 1124  02/07/18 0127 02/07/18 0818  NA 137   < > 144 148*  K 4.1  --  4.1  --   CL 102  --  110  --   CO2 26  --  26  --   GLUCOSE 136*  --  132*  --   BUN 16  --  11  --   CREATININE 0.81  --  0.83  --   CALCIUM 9.1  --  8.7*  --    < > = values in this interval not displayed.   Lipid Panel:     Component Value Date/Time   CHOL 149 02/05/2018 1614   TRIG 76 02/14/2018 1614   HDL 75 03/02/2018 1614   CHOLHDL 2.0 03/03/2018 1614   VLDL 15 02/10/2018 1614   LDLCALC 59 02/07/2018 1614   HgbA1c:  Lab Results  Component Value Date   HGBA1C 5.7 (H) 02/26/2018   Urine Drug Screen:     Component Value Date/Time   LABOPIA NONE DETECTED 03/04/2018 1206   COCAINSCRNUR NONE DETECTED 02/10/2018 1206   LABBENZ NONE DETECTED 02/27/2018 1206   AMPHETMU NONE DETECTED 02/12/2018 1206   THCU NONE DETECTED 02/25/2018 1206   LABBARB NONE DETECTED 02/10/2018 1206    Alcohol Level     Component Value Date/Time   ETH <10 02/05/2018 1124    IMAGING Ct Head Wo  Contrast  Result Date: 02/07/2018 CLINICAL DATA:  Follow-up known intracranial hemorrhage EXAM: CT HEAD WITHOUT CONTRAST TECHNIQUE: Contiguous axial images were obtained from the base of the skull through the vertex without intravenous contrast. COMPARISON:  Brain MRI yesterday. FINDINGS: Brain: Large right frontal intraparenchymal hemorrhage is unchanged in extent, the 64 by 72 mm as measured on prior brain MRI. There is progression of confluent and patchy hematoma in the right temporal lobe which including subdural hemorrhage measures up to 4.3 cm in transverse span, previously 2.7 cm. There is progressive in edema about the parenchymal hemorrhages. Similar degree of intraventricular hemorrhage layering in the occipital horn of the left lateral ventricle when compared with prior MRI. There is patchy subarachnoid hemorrhage seen in the posterior fossa, interpeduncular cistern, and left sylvian fissure - new from prior head CT but may have been present on the brain MRI. Subdural hematoma along the right cerebral convexity measuring  up to 8 mm in thickness, not progressed. Midline shift is unchanged from brain MRI, 14 mm. There is unchanged dilatation of the left lateral ventricle. Vascular: No hyperdense vessel or unexpected calcification. Skull: Negative Sinuses/Orbits: Bilateral cataract resection IMPRESSION: 1. Increased parenchymal hemorrhage and swelling in the right temporal lobe. Unchanged large parenchymal hemorrhage in the right frontal lobe. 2. No progression of subdural hemorrhage along the right cerebral convexity. 3. Unchanged 14 mm midline shift with subfalcine herniation. Left lateral ventriculomegaly is stable. 4. Intraventricular and subarachnoid hemorrhage. Electronically Signed   By: Monte Fantasia M.D.   On: 02/07/2018 06:05   Mr Jeri Cos ZS Contrast  Result Date: 02/17/2018 CLINICAL DATA:  85 year old female code stroke presentation with large right hemisphere and extra-axial hemorrhage on  head CT at 1118 hours today. EXAM: MRI HEAD WITHOUT AND WITH CONTRAST TECHNIQUE: Multiplanar, multiecho pulse sequences of the brain and surrounding structures were obtained without and with intravenous contrast. CONTRAST:  4 milliliters Gadavist COMPARISON:  Head CT 1118 hours today.  Brain MRI 10/13/2016. FINDINGS: Brain: Large right anterior frontal lobe intra-axial hemorrhage encompassing 72 x 64 x 40 millimeters (AP by transverse by CC) with isointense T1 and hyperintense T2 signal (hyperacute). Susceptibility artifact related to the blood products but no larger area of restricted diffusion. Related versus secondary area of hemorrhage in the right lateral temporal gyrus (coronal series 26, image 15), which was not evident at 1118 hours today by CT. Extra-axial extension of blood into the right subdural space with subdural hematoma 15-16 millimeters in thickness along the right convexity. Associated intracranial mass effect with leftward midline shift of 14 millimeters (same image). Subtotal effacement of the right lateral ventricle, mild uncal herniation, and effaced ambient cisterns. There is superimposed intraventricular hemorrhage with enlargement of the left temporal horn and atrium of the lateral ventricle (series 18, image 14). Following contrast there is petechial enhancement in the right temporal lobe (series 27, image 27) but no abnormal enhancement in the anterior right frontal lobe hemorrhage epicenter. No other abnormal enhancement. Chronic patchy bilateral cerebral white matter T2 and FLAIR hyperintensity is superimposed. Signal in the brainstem and cerebellum is stable and within normal limits. Negative pituitary and cervicomedullary junction. Vascular: Major intracranial vascular flow voids are stable, but there is mass effect on the anterior circulation. The major dural venous sinuses are enhancing and appear to be patent. Skull and upper cervical spine: Negative visible cervical spine. Normal  bone marrow signal. Sinuses/Orbits: Negative. Other: Intubated. Small volume layering secretions in the pharynx. Mastoids remain clear. IMPRESSION: 1. Large right hemisphere intra-axial hemorrhage with extension into the ventricles and the right subdural space. There has been some progression since the CT at 1118 hours today: - increased right side subdural thickness (15 mm), - increased leftward midline shift (14 mm), and - increased trapping/enlargement of the left lateral ventricle. 2. Additionally, there is new hemorrhage within the right lateral temporal lobe which was not apparent on the earlier CT. And this demonstrates some petechial enhancement following contrast. The etiology of both hemorrhages is unclear, and there is no larger area of ischemia in the right hemisphere. No underlying mass or tumor is suspected at this time. Critical Value/emergent results were called by telephone at the time of interpretation on 02/05/2018 at 1554 hours to Dr. Amie Portland , who verbally acknowledged these results. Electronically Signed   By: Genevie Ann M.D.   On: 02/07/2018 16:01   Dg Chest Port 1 View  Result Date: 02/17/2018 CLINICAL DATA:  Code stroke EXAM: PORTABLE CHEST 1 VIEW COMPARISON:  09/25/2017 FINDINGS: Endotracheal tube is in place. Tip is 5.3 cm from the carina. NG tube tip is beyond the fundus of the stomach. Nodular density at the lateral right lung base is most likely a nipple shadow. Lungs are otherwise hyperaerated and clear. No pneumothorax. No pleural effusion. IMPRESSION: Endotracheal and NG tubes as described. Nodular density at the right lung base is most likely a nipple shadow. Repeat with nipple markers is recommended when feasible. Electronically Signed   By: Marybelle Killings M.D.   On: 02/13/2018 12:24   Ct Head Code Stroke Wo Contrast  Result Date: 02/20/2018 CLINICAL DATA:  Code stroke. LEFT arm and LEFT facial weakness. Medication list includes Plavix. EXAM: CT HEAD WITHOUT CONTRAST TECHNIQUE:  Contiguous axial images were obtained from the base of the skull through the vertex without intravenous contrast. COMPARISON:  CT head 09/24/2017. FINDINGS: Brain: Large RIGHT frontal intracerebral hemorrhage, superficially located with extension into the subdural space, both interhemispheric as well as along the convexity. Some extension to the RIGHT lateral ventricle. Estimated measurements are 51 x 68 x 34 mm, corresponding to an estimated volume of 60 mL. The volume of hemorrhage is likely greater however because of the extension outside the brain. Midline shift estimated 9 mm, with slight medial displacement of the RIGHT uncus and brainstem rotation. Overall mild atrophy with small vessel disease is suspected. Vascular: No hyperdense vessel. Skull: Intact. Sinuses/Orbits: No acute finding. Other: None. ASPECTS [facility] Stroke Program Early CT Score) Not applicable. IMPRESSION: Large RIGHT frontal intracerebral hemorrhage, 51 x 68 x 34 mm, with extension into the subdural space both interhemispheric as well as along the convexity. Also intraventricular extension. Estimated volume 60 mL. Midline shift approximately 9 mm. The etiology of the bleed is likely lobar hypertensive, with potential worsening due to anticoagulation. Critical Value/emergent results were called by telephone at the time of interpretation on 02/25/2018 at 11:30 am to Dr. Noemi Chapel , who verbally acknowledged these results. Electronically Signed   By: Staci Righter M.D.   On: 02/16/2018 11:39   2D echocardiogram  1. No evidence of left ventricular regional wall motion abnormalities. The left ventricle has normal systolic function of 89-38%. The cavity size is normal. There is normal left ventricular wall thickness. Echo evidence of impaired relaxation diastolic  filling patterns. Indeterminate left ventricular filling pressures.  2. Normal left atrial size.  3. Normal right atrial size.  4. Normal tricuspid valve.  5. Tricuspid  regurgitation mild-moderate.  6. The aortic valve normal. Aortic valve regurgitation is mild by color flow Doppler.  7. No atrial level shunt detected by color flow Doppler.   PHYSICAL EXAM  Frail elderly malnourished-looking Caucasian lady not in distress. Intubated sedated. . Afebrile. Head is nontraumatic. Neck is supple without bruit.    Cardiac exam no murmur or gallop. Lungs are clear to auscultation. Distal pulses are well felt. Neurological Exam :  She is sedated, intubated. Eyes are closed. Response to sternal rub with purposeful movements on the right. She does follow simple midline commands as well as commands with the right hand. Right gaze preference. Eyes do not move to the left even with doll's eye movements. Pupils irregular sluggishly reactive. Fundi could not be visualized.tongue is midline. Spontaneous antigravity purposeful movements on the right. Dense left hemiplegia with no response even to noxious stimuli. Right plantar downgoing left upgoing. Tone is diminished on the left compared to the right.  ASSESSMENT/PLAN Bridget Ball  is a 85 y.o. female with history of COPD, early dementia, Gerd, Hld, CAD with previous MI, pulmonary edema, a previous stroke, SAH, ASPVD, renal insufficiency, anxiety and depression who collapse at church, presenting to [facility] with HA, nausea, vomiting and L hemiparesis. Transferred to [facility].   Stroke:  Large right frontal ICH with IVH extension and SDH who developed a second smaller R lateral temporal lobe ICH. Has cytotoxic cerebral edema. Known prior ICH and likely amyloid angiopathy  NS consult Trenton Gammon) no indication for surgical intervention at this time  CT head 2/2 1139 large right frontal ICH with extension into the subdural space and IVH.  Vol 60 mLs.  MRI  2/2 18 large R ICH with SDH and IVH.  Right to left shift 14 mm.  Trapping/enlargement of left ventricle.  New hemorrhage within the right lateral temporal lobe with  petechial enhancement following contrast.  No ischemia  CT head 2/3 0530 increased intraparenchymal hemorrhage and swelling on the right temporal lobe.  Unchanged large right frontal lobe hemorrhage.  No progression of subdural hemorrhage.  Midline shift with subfalcine herniation stable at 14 mm.  IVH and SAH  2D Echo  EF 55-60%. No source of embolus   LDL 76  HgbA1c 5.5  SCDs for VTE prophylaxis Diet Order            Diet NPO time specified  Diet effective now              aspirin 81 mg daily and clopidogrel 75 mg daily prior to admission, now on No antithrombotic given hemorrhage  Therapy recommendations:  pending   Disposition:  pending   Neurological worsening anticipated over the next few days  Son to speak with siblings r/t prognosis. Dr. Leonie Man available to discuss with them as a group.  Acute Respiratory Failure  Intubated at AP  CCM on board  Cytotoxic Cerebral Edema Induced Hypernatremia  Started on 3% protocol via PIC to decrease cerebral edema  Drip currently at 50/hr. Na 144  Increase drip rate to 75/hr  Goal Na 150-155  Check Na q 6h  Keep via PIV now, consider PICC if family decides for aggressive care  Dysphagia  Secondary to Bridget Ball  NPO  Febrile  TM 101.1  Likely secondary to Minor  Hypertension  Variable - 82/53-176/118 . Permissive hypertension (OK if < 220/120) but gradually normalize in 5-7 days . Long-term BP goal normotensive  Hyperlipidemia  Home meds: Lipitor 80  Hold statin given ICH  LDL 76  Consider resuming statin at discharge, depending on plan of care  Other Stroke Risk Factors  Advanced age  Former cigarette smoker, quit 20 years ago   ETOH use  Family hx stroke (mother)  Coronary artery disease hx MI   PVD Hx of right popliteal artery aneurysm ([place])  Hx of SAH with seizure in 11/2012  EEG negative at that time  Was on keppra and depakote  depakote discontinued  Still on keppra 250mg   bid  Continue keppra   Other Active Problems  COPD  Dementia at baseline  Anemia Hgb 10.3  Hospital day # [place], MSN, APRN, ANVP-BC, AGPCNP-BC Advanced Practice Stroke Nurse [place] for Schedule & Pager information 02/07/2018 4:50 PM  I have personally obtained history,examined this patient, reviewed notes, independently viewed imaging studies, participated in medical decision making and plan of care.ROS completed by me personally and pertinent positives fully documented  I have made any additions  or clarifications directly to the above note. Agree with note above. She has presented with a large right brain parenchymal hemorrhage with subarachnoid and intraventricular extension and has a very poor prognosis and is unlikely to survive and making meaningful recovery and live independently. Had a long discussion with patient's son and daughters who clearly stated patient would not want to have lived with life support, tracheostomy, feeding tube and nursing home care. They agreed to DO NOT RESUSCITATE and thinking about withdrawal of ventilatory support over the next few days. Meanwhile continue aggressive treatment of hypertension, hypertonic saline with sodium goal 150-155 and maintain normothermia and normoglycemia. Discuss with patient's family as well as with Dr. Lamonte Sakai critical care medicine.This patient is critically ill and at significant risk of neurological worsening, death and care requires constant monitoring of vital signs, hemodynamics,respiratory and cardiac monitoring, extensive review of multiple databases, frequent neurological assessment, discussion with family, other specialists and medical decision making of high complexity.I have made any additions or clarifications directly to the above note.This critical care time does not reflect procedure time, or teaching time or supervisory time of PA/NP/Med Resident etc but could involve care discussion  time.  I spent 40 minutes of neurocritical care time  in the care of  this patient.      Antony Contras, MD Medical Director [place] Pager: [phone] 02/07/2018 4:57 PM  To contact Stroke Continuity provider, please refer to [email or web address] After hours, contact General Neurology

## 2018-02-07 NOTE — Progress Notes (Addendum)
Initial Nutrition Assessment  DOCUMENTATION CODES:   Severe malnutrition in context of chronic illness  INTERVENTION:   If within [place], recommend tube feeding via OG tube: - Vital AF 1.2 @ 45 ml/hr (1080 ml/day) - Liquid MVI  Recommended tube feeding regimen provides 1296 kcal, 81 grams of protein, and 875 ml of H2O (100% of needs).  NUTRITION DIAGNOSIS:   Severe Malnutrition related to chronic illness (dementia, COPD, CHF) as evidenced by severe fat depletion, severe muscle depletion.  GOAL:   Patient will meet greater than or equal to 90% of their needs  MONITOR:   Vent status, Labs, Weight trends, Other (GOC)  REASON FOR ASSESSMENT:   Ventilator    ASSESSMENT:   85 year old female who presented to the ED on 2/2 as a cod stroke. Pt intubated in the ED. PMH significant for COPD, CHF, HLD, dementia, GERD, prior stroke. CT showing R frontal intracerebral hemorrhage.  Discussed pt with RN. Propofol off this morning. Per RN, family considering comfort care and one-way extubation. RD to leave TF recommendations if needed.  Spoke with pt's daughter at bedside. Pt's daughter shares that pt had sepsis 3 years ago and lost a lot of weight. Since that time, pt has been unable to gain the weight back. Per pt's daughter, pt weighed 120 lbs prior to weight loss.  Pt's daughter shares that when pt is with her, pt eats 3 meals daily. Pt requires feeding assistance. Pt lives mostly with her other daughter who is not present at bedside. Pt's daughter notes that pt has been drinking Ensure supplements but is unsure of frequency.  Pt's daughter states that pt occasionally has issues chewing due to ill-fitting dentures. Therefore, pt's daughter cooks softer foods for her like baked chicken. Pt typically drinks coffee, water, and Ensure.  Pt's daughter states that pt has had several nonhealing wounds to BLE related to scratching.  Per weight history in chart, pt's weight has remained fairly  stable between 41.7-44.5 kg over the last 1 year. Weight of 47.6 kg on admission appears stated rather than measured.  Patient is currently intubated on ventilator support. OG tube to stomach with 800 ml output x 24 hours MV: 7.7 L/min Temp (24hrs), Avg:100.2 F (37.9 C), Min:99 F (37.2 C), Max:101.1 F (38.4 C) BP: 110/50 MAP: 65  Propofol: none IVF: NaCl 3% @ 75 ml/hr  Medications reviewed and include: Protonix, Senna  Labs reviewed: 148 (H)  NUTRITION - FOCUSED PHYSICAL EXAM:    Most Recent Value  Orbital Region  Severe depletion  Upper Arm Region  Severe depletion  Thoracic and Lumbar Region  Severe depletion  Buccal Region  Unable to assess  Temple Region  Moderate depletion  Clavicle Bone Region  Severe depletion  Clavicle and Acromion Bone Region  Severe depletion  Scapular Bone Region  Unable to assess  Dorsal Hand  Moderate depletion  Patellar Region  Severe depletion  Anterior Thigh Region  Severe depletion  Posterior Calf Region  Moderate depletion  Edema (RD Assessment)  None  Hair  Reviewed  Eyes  Unable to assess  Mouth  Unable to assess  Skin  Reviewed  Nails  Reviewed       Diet Order:   Diet Order            Diet NPO time specified  Diet effective now              EDUCATION NEEDS:   Not appropriate for education at this time  Skin:  Skin Assessment: Reviewed RN Assessment  Last BM:  PTA/unknown  Height:   Ht Readings from Last 1 Encounters:  02/07/2018 5\' 3"  (1.6 m)    Weight:   Wt Readings from Last 1 Encounters:  02/28/2018 47.6 kg    Ideal Body Weight:  52.3 kg  BMI:  Body mass index is 18.6 kg/m.  Estimated Nutritional Needs:   Kcal:  1291  Protein:  65-80 grams  Fluid:  >/= 1.5 L    Gaynell Face, MS, RD, LDN Inpatient Clinical Dietitian Pager: [phone] Weekend/After Hours: [phone]

## 2018-02-08 ENCOUNTER — Inpatient Hospital Stay (HOSPITAL_COMMUNITY): Payer: Medicare Other

## 2018-02-08 LAB — CBC
HCT: 29.9 % — ABNORMAL LOW (ref 36.0–46.0)
Hemoglobin: 9.4 g/dL — ABNORMAL LOW (ref 12.0–15.0)
MCH: 32.1 pg (ref 26.0–34.0)
MCHC: 31.4 g/dL (ref 30.0–36.0)
MCV: 102 fL — ABNORMAL HIGH (ref 80.0–100.0)
Platelets: 158 10*3/uL (ref 150–400)
RBC: 2.93 MIL/uL — ABNORMAL LOW (ref 3.87–5.11)
RDW: 14.8 % (ref 11.5–15.5)
WBC: 8.8 10*3/uL (ref 4.0–10.5)
nRBC: 0 % (ref 0.0–0.2)

## 2018-02-08 LAB — BASIC METABOLIC PANEL
BUN: 11 mg/dL (ref 8–23)
CO2: 22 mmol/L (ref 22–32)
Calcium: 8.7 mg/dL — ABNORMAL LOW (ref 8.9–10.3)
Chloride: >130 mmol/L (ref 98–111)
Creatinine, Ser: 0.75 mg/dL (ref 0.44–1.00)
GFR calc Af Amer: >60 mL/min (ref >60)
GFR calc non Af Amer: >60 mL/min (ref >60)
Glucose, Bld: 153 mg/dL — ABNORMAL HIGH (ref 70–99)
Potassium: 3 mmol/L — ABNORMAL LOW (ref 3.5–5.1)
SODIUM: 161 mmol/L — AB (ref 135–145)

## 2018-02-08 LAB — MAGNESIUM: Magnesium: 1.9 mg/dL (ref 1.7–2.4)

## 2018-02-08 LAB — PHOSPHORUS: PHOSPHORUS: 2.6 mg/dL (ref 2.5–4.6)

## 2018-02-08 MED ORDER — LABETALOL HCL 5 MG/ML IV SOLN
10.0000 mg | INTRAVENOUS | Status: DC | PRN
  Administered 2018-02-08 (×4): 10 mg via INTRAVENOUS
  Filled 2018-02-08: qty 4

## 2018-02-08 MED ORDER — HYDRALAZINE HCL 20 MG/ML IJ SOLN
10.0000 mg | INTRAMUSCULAR | Status: DC | PRN
  Filled 2018-02-08: qty 1

## 2018-02-08 MED ORDER — DEXTROSE 5 % IV SOLN: INTRAVENOUS | Status: DC

## 2018-02-08 MED ORDER — GLYCOPYRROLATE 0.2 MG/ML IJ SOLN: 0.2000 mg | INTRAMUSCULAR | Status: DC | PRN

## 2018-02-08 MED ORDER — GLYCOPYRROLATE 0.2 MG/ML IJ SOLN
0.2000 mg | INTRAMUSCULAR | Status: DC | PRN
  Administered 2018-02-08 (×2): 0.2 mg via INTRAVENOUS
  Filled 2018-02-08 (×2): qty 1

## 2018-02-08 MED ORDER — FENTANYL CITRATE (PF) 100 MCG/2ML IJ SOLN
25.0000 ug | INTRAMUSCULAR | Status: DC | PRN
  Administered 2018-02-08: 50 ug via INTRAVENOUS
  Administered 2018-02-08: 25 ug via INTRAVENOUS
  Administered 2018-02-10 (×2): 100 ug via INTRAVENOUS
  Filled 2018-02-08 (×4): qty 2

## 2018-02-08 MED ORDER — POLYVINYL ALCOHOL 1.4 % OP SOLN
1.0000 [drp] | Freq: Four times a day (QID) | OPHTHALMIC | Status: DC | PRN
  Filled 2018-02-08: qty 15

## 2018-02-08 MED ORDER — GLYCOPYRROLATE 1 MG PO TABS
1.0000 mg | ORAL_TABLET | ORAL | Status: DC | PRN
  Filled 2018-02-08: qty 1

## 2018-02-08 MED ORDER — MORPHINE 100MG IN NS 100ML (1MG/ML) PREMIX INFUSION
1.0000 mg/h | INTRAVENOUS | Status: DC
  Administered 2018-02-08: 1 mg/h via INTRAVENOUS
  Filled 2018-02-08 (×2): qty 100

## 2018-02-08 MED ORDER — POTASSIUM CHLORIDE 20 MEQ/15ML (10%) PO SOLN: 40.0000 meq | Freq: Once | ORAL | Status: DC

## 2018-02-08 MED ORDER — DIPHENHYDRAMINE HCL 50 MG/ML IJ SOLN: 25.0000 mg | INTRAMUSCULAR | Status: DC | PRN

## 2018-02-08 NOTE — Progress Notes (Signed)
MD paged for Patients high blood pressure despite available interventions. PRN Labetalol ordered, will administer promptly.

## 2018-02-08 NOTE — Progress Notes (Signed)
OT Cancellation Note  Patient Details Name: Bridget Ball MRN: [id number] DOB: [birth year]   Cancelled Treatment:    Reason Eval/Treat Not Completed: Patient not medically ready;Active bedrest order.  Per chart, plan is for one way extubation once family arrives.  Will follow up tomorrow to determine appropriateness of OT services.  Lucille Passy, OTR/L Acute Rehabilitation Services Pager [phone] Office [phone]   Lucille Passy M 02/08/2018, 5:32 AM

## 2018-02-08 NOTE — Progress Notes (Signed)
Pt given 78mcg IV Fentanyl prior to transfer @ 1545.  When pt arrived to floor, she was still breathing heavily but in no obvious distress.  Pt given 41mcg Fentanyl @ 1715 after pt's family stating she was still breathing heavily.  Called into room by pt's daughter again and pt continued to have heavy, labored breathing.  MD notified and received order to begin a Morphine drip.  Will carry out and continue to monitor.  Eliezer Bottom [place]

## 2018-02-08 NOTE — Progress Notes (Signed)
RT extubated patient one way with RN at bedside per MD order and family wishes.

## 2018-02-08 NOTE — Progress Notes (Signed)
PT Cancellation Note  Patient Details Name: Bridget Ball MRN: [id number] DOB: 25-Jan-1928   Cancelled Treatment:    Reason Eval/Treat Not Completed: Active bedrest order.  PT following.  Thanks,  Barbarann Ehlers. Levan Aloia, PT, DPT  Acute Rehabilitation [phone] pager [phone]) [phone] office     Barbarann Ehlers Dary Dilauro 02/08/2018, 9:44 AM

## 2018-02-08 NOTE — Progress Notes (Addendum)
NAME:  TAKHIA SPOON, MRN:  [id number], DOB:  [birth year], LOS: 2 ADMISSION DATE:  02/09/2018, CONSULTATION DATE:  2/2 REFERRING MD:  Dr Jaynee Eagles, CHIEF COMPLAINT:  VDRF   Brief History   Jaslyne Beeck is 38, history of COPD, coronary disease, previous stroke and SAH.  She was admitted 2/2 with a sudden onset altered mental status left hemiparesis.  She has a large right ICH.  Now intubated and sedated.  Started on 3% NS and no plans for neurosurgery at this time.  PCCM consulted for ventilator and medical management  Significant Hospital Events   2/2 > admit.  Consults:  NSGY PCCM  Procedures:  ETT 2/2 >   Significant Diagnostic Tests:  Head CT 2/2>> large right frontal ICH, extension in the subdural space, 9 mm shift MRI brain 2/2>> large right ICH, extension to ventricles, slight progression compared with CT, 14 mm leftward shift, new hemorrhage in the right lateral temporal lobe with petechial characteristics.  No underlying mass noted CTH 2/3 > increased IPH with swelling in right temporal lobe.  Unchanged large IPH in right frontal lobe.  No progression of SDH, unchanged 60mm MLS with subfalcine herniation. Echo 2/3 > EF 55-60%.  Micro Data:  None.  Antimicrobials:  None.  Interim history/subjective:  Comfortable.  Follows some basic commands. Per RN, family wanting to proceed with one way extubation later today once family arrives.  Objective   Blood pressure 136/69, pulse 60, temperature 99.7 F (37.6 C), resp. rate 16, height 5\' 3"  (1.6 m), weight 47.6 kg, SpO2 100 %.    Vent Mode: PRVC FiO2 (%):  [40 %] 40 % Set Rate:  [16 bmp] 16 bmp Vt Set:  [450 mL] 450 mL PEEP:  [5 cmH20] 5 cmH20 Plateau Pressure:  [10 cmH20-16 cmH20] 12 cmH20   Intake/Output Summary (Last 24 hours) at 02/08/2018 0813 Last data filed at 02/08/2018 0600 Gross per 24 hour  Intake 1786.31 ml  Output 2050 ml  Net -263.69 ml   Filed Weights   02/19/2018 1132  Weight: 47.6 kg    Examination:    General: Thin chronically ill-appearing woman, intubated HENT: [place]/AT. ETT in place. Lungs: Clear bilaterally no wheeze. Cardiovascular: Regular, no murmur. Abdomen: Soft, benign, positive bowel sounds. Extremities: No edema. Neuro:  Sedated but follows some intermittent basic commands on right.   Assessment & Plan:   Acute respiratory failure due to encephalopathy and poor airway protection in setting IPH, Continue full ventilatory support with PRVC 8 cc/kg. VAP prevention orders. Per RN, family opting for one way extubation later today once they all arrive.  Acute right frontal intracerebral hemorrhage as described above.  Question hypertensive in nature.  3% NS stopped 2/4 due to hypernatremia. No indication currently for surgical intervention. Repeat imaging as per neurology and neurosurgery plans. Hold Plavix. Continue preadmission keppra especially in light new bleed.  Hypertension - improved after cleviprex. Monitor off cleviprex. Hydralazine and Labetalol PRN.  COPD . Continue bronchodilators.   Hx CAD. Hold preadmission ASA, plavix.  Hypokalemia. 40 mEq K per tube.  Best practice:  Diet: NPO Pain/Anxiety/Delirium protocol (if indicated): Propofol VAP protocol (if indicated): yes DVT prophylaxis: SCD GI prophylaxis: PPI Glucose control: initiate if CBG > 180 Mobility: BR Code Status: Full Family Communication:  None available on AM rounds 2/4.  Per RN, family opting for one way extubation today once they all arrive. Disposition: ICU.   Critical care time: 30 min.    Montey Hora, PA - [facility] [place]  Pulmonary & Critical Care Medicine Pager: [phone] - [phone].  If no answer, [phone] 02/08/2018, 8:13 AM

## 2018-02-08 NOTE — Progress Notes (Signed)
CRITICAL VALUE ALERT  Critical Value:  Na 161 and Chloride >130  Date & Time Notified:  8527 02/08/2018   Provider Notified: Dr Leonel Ramsay   Orders Received/Actions taken: hypertonic saline stopped

## 2018-02-08 NOTE — Progress Notes (Signed)
STROKE TEAM PROGRESS NOTE   INTERVAL HISTORY Her son and daughters are at the bedside.  Family has decided on comfort care and withdrawal of life support She seems less arousable today Vitals:   02/08/18 1148 02/08/18 1200 02/08/18 1300 02/08/18 1659  BP:  118/70 (!) 100/58 115/68  Pulse:  86 (!) 46 85  Resp:  18 (!) 25   Temp:  98.8 F (37.1 C) 99 F (37.2 C)   TempSrc:      SpO2: 100% 97% 98% 96%  Weight:      Height:        CBC:  Recent Labs  Lab 02/21/2018 1124 02/07/18 0127 02/08/18 0442  WBC 8.5 10.0 8.8  NEUTROABS 7.2  --   --   HGB 11.7* 10.3* 9.4*  HCT 38.1 32.3* 29.9*  MCV 101.1* 99.7 102.0*  PLT 194 173 161    Basic Metabolic Panel:  Recent Labs  Lab 02/07/18 0127  02/07/18 2214 02/08/18 0442  NA 144   < > 158* 161*  K 4.1  --   --  3.0*  CL 110  --   --  >130*  CO2 26  --   --  22  GLUCOSE 132*  --   --  153*  BUN 11  --   --  11  CREATININE 0.83  --   --  0.75  CALCIUM 8.7*  --   --  8.7*  MG  --   --   --  1.9  PHOS  --   --   --  2.6   < > = values in this interval not displayed.   Lipid Panel:     Component Value Date/Time   CHOL 149 02/23/2018 1614   TRIG 76 02/21/2018 1614   HDL 75 02/18/2018 1614   CHOLHDL 2.0 02/19/2018 1614   VLDL 15 02/10/2018 1614   LDLCALC 59 02/21/2018 1614   HgbA1c:  Lab Results  Component Value Date   HGBA1C 5.7 (H) 03/03/2018   Urine Drug Screen:     Component Value Date/Time   LABOPIA NONE DETECTED 02/14/2018 1206   COCAINSCRNUR NONE DETECTED 02/17/2018 1206   LABBENZ NONE DETECTED 02/24/2018 1206   AMPHETMU NONE DETECTED 02/19/2018 1206   THCU NONE DETECTED 02/20/2018 1206   LABBARB NONE DETECTED 02/08/2018 1206    Alcohol Level     Component Value Date/Time   ETH <10 02/14/2018 1124    IMAGING Ct Head Wo Contrast  Result Date: 02/07/2018 CLINICAL DATA:  Follow-up known intracranial hemorrhage EXAM: CT HEAD WITHOUT CONTRAST TECHNIQUE: Contiguous axial images were obtained from the base of  the skull through the vertex without intravenous contrast. COMPARISON:  Brain MRI yesterday. FINDINGS: Brain: Large right frontal intraparenchymal hemorrhage is unchanged in extent, the 64 by 72 mm as measured on prior brain MRI. There is progression of confluent and patchy hematoma in the right temporal lobe which including subdural hemorrhage measures up to 4.3 cm in transverse span, previously 2.7 cm. There is progressive in edema about the parenchymal hemorrhages. Similar degree of intraventricular hemorrhage layering in the occipital horn of the left lateral ventricle when compared with prior MRI. There is patchy subarachnoid hemorrhage seen in the posterior fossa, interpeduncular cistern, and left sylvian fissure - new from prior head CT but may have been present on the brain MRI. Subdural hematoma along the right cerebral convexity measuring up to 8 mm in thickness, not progressed. Midline shift is unchanged from brain MRI, 14 mm.  There is unchanged dilatation of the left lateral ventricle. Vascular: No hyperdense vessel or unexpected calcification. Skull: Negative Sinuses/Orbits: Bilateral cataract resection IMPRESSION: 1. Increased parenchymal hemorrhage and swelling in the right temporal lobe. Unchanged large parenchymal hemorrhage in the right frontal lobe. 2. No progression of subdural hemorrhage along the right cerebral convexity. 3. Unchanged 14 mm midline shift with subfalcine herniation. Left lateral ventriculomegaly is stable. 4. Intraventricular and subarachnoid hemorrhage. Electronically Signed   By: Monte Fantasia M.D.   On: 02/07/2018 06:05   Dg Chest Port 1 View  Result Date: 02/08/2018 CLINICAL DATA:  Respiratory failure EXAM: PORTABLE CHEST 1 VIEW COMPARISON:  02/09/2018 FINDINGS: Cardiac shadow is stable. Endotracheal tube and gastric catheter are again seen and stable. Left ureteral stent is noted. Lungs are hyperinflated. Nipple shadows are again noted bilaterally. Slight increased  density is noted in the left lung base consistent with atelectasis. No other focal abnormality is noted. IMPRESSION: COPD and mild left basilar atelectasis. No other focal abnormality is noted. Electronically Signed   By: Inez Catalina M.D.   On: 02/08/2018 08:10   2D echocardiogram  1. No evidence of left ventricular regional wall motion abnormalities. The left ventricle has normal systolic function of 81-01%. The cavity size is normal. There is normal left ventricular wall thickness. Echo evidence of impaired relaxation diastolic  filling patterns. Indeterminate left ventricular filling pressures.  2. Normal left atrial size.  3. Normal right atrial size.  4. Normal tricuspid valve.  5. Tricuspid regurgitation mild-moderate.  6. The aortic valve normal. Aortic valve regurgitation is mild by color flow Doppler.  7. No atrial level shunt detected by color flow Doppler.   PHYSICAL EXAM  Frail elderly malnourished-looking Caucasian lady not in distress. Intubated sedated. . Afebrile. Head is nontraumatic. Neck is supple without bruit.    Cardiac exam no murmur or gallop. Lungs are clear to auscultation. Distal pulses are well felt. Neurological Exam :  She is sedated, intubated. Eyes are closed. Response to sternal rub with purposeful movements on the right. She does follow simple midline commands as well as commands with the right hand. Right gaze preference. Eyes do not move to the left even with doll's eye movements. Pupils irregular sluggishly reactive. Fundi could not be visualized.tongue is midline. Spontaneous antigravity purposeful movements on the right. Dense left hemiplegia with no response even to noxious stimuli. Right plantar downgoing left upgoing. Tone is diminished on the left compared to the right.  ASSESSMENT/PLAN [name] is a 85 y.o. female with history of COPD, early dementia, Gerd, Hld, CAD with previous MI, pulmonary edema, a previous stroke, SAH, ASPVD, renal  insufficiency, anxiety and depression who collapse at church, presenting to [facility] with HA, nausea, vomiting and L hemiparesis. Transferred to [facility].   Stroke:  Large right frontal ICH with IVH extension and SDH who developed a second smaller R lateral temporal lobe ICH. Has cytotoxic cerebral edema. Known prior ICH and likely amyloid angiopathy  NS consult Trenton Gammon) no indication for surgical intervention at this time  CT head 2/2 1139 large right frontal ICH with extension into the subdural space and IVH.  Vol 60 mLs.  MRI  2/2 4 large R ICH with SDH and IVH.  Right to left shift 14 mm.  Trapping/enlargement of left ventricle.  New hemorrhage within the right lateral temporal lobe with petechial enhancement following contrast.  No ischemia  CT head 2/3 0530 increased intraparenchymal hemorrhage and swelling on the right temporal lobe.  Unchanged large right frontal lobe hemorrhage.  No progression of subdural hemorrhage.  Midline shift with subfalcine herniation stable at 14 mm.  IVH and SAH  2D Echo  EF 55-60%. No source of embolus   LDL 76  HgbA1c 5.5  SCDs for VTE prophylaxis Diet Order            Diet NPO time specified  Diet effective now              aspirin 81 mg daily and clopidogrel 75 mg daily prior to admission, now on No antithrombotic given hemorrhage  Therapy recommendations:  pending   Disposition:  pending   Neurological worsening anticipated over the next few days  Son to speak with siblings r/t prognosis. Dr. Leonie Man available to discuss with them as a group.  Acute Respiratory Failure  Intubated at AP  CCM on board  Cytotoxic Cerebral Edema Induced Hypernatremia  Started on 3% protocol via PIC to decrease cerebral edema  Drip currently at 50/hr. Na 144  Increase drip rate to 75/hr  Goal Na 150-155  Check Na q 6h  Keep via PIV now, consider PICC if family decides for aggressive care  Dysphagia  Secondary to  [place]  NPO  Febrile  TM 101.1  Likely secondary to [place]  Hypertension  Variable - 82/53-176/118 . Permissive hypertension (OK if < 220/120) but gradually normalize in 5-7 days . Long-term BP goal normotensive  Hyperlipidemia  Home meds: Lipitor 80  Hold statin given ICH  LDL 76  Consider resuming statin at discharge, depending on plan of care  Other Stroke Risk Factors  Advanced age  Former cigarette smoker, quit 20 years ago   ETOH use  Family hx stroke (mother)  Coronary artery disease hx MI   PVD Hx of right popliteal artery aneurysm ([place])  Hx of SAH with seizure in 11/2012  EEG negative at that time  Was on keppra and depakote  depakote discontinued  Still on keppra 250mg  bid  Continue keppra   Other Active Problems  COPD  Dementia at baseline  Anemia Hgb 10.3  Hospital day # 2    She has presented with a large right brain parenchymal hemorrhage with subarachnoid and intraventricular extension and has a very poor prognosis and is unlikely to survive and making meaningful recovery and live independently. Had a long discussion with patient's son and daughters who clearly stated patient would not want to have lived with life support, tracheostomy, feeding tube and nursing home care. They agreed to DO NOT RESUSCITATE and   withdrawal of ventilatory support  today.  Plan to make patient full comfort care and extubate. Discuss with patient's family and Dr. Lynetta Mare  Critical care medicine.This patient is critically ill and at significant risk of neurological worsening, death and care requires constant monitoring of vital signs, hemodynamics,respiratory and cardiac monitoring, extensive review of multiple databases, frequent neurological assessment, discussion with family, other specialists and medical decision making of high complexity.I have made any additions or clarifications directly to the above note.This critical care time does not reflect procedure  time, or teaching time or supervisory time of PA/NP/Med Resident etc but could involve care discussion time.  I spent 30 minutes of neurocritical care time  in the care of  this patient.      Antony Contras, MD Medical Director [place] Pager: [phone] 02/08/2018 5:04 PM  To contact Stroke Continuity provider, please refer to [email or web address] After hours, contact General Neurology

## 2018-02-08 NOTE — Care Management Note (Signed)
Case Management Note  Patient Details  Name: Bridget Ball MRN: [id number] Date of Birth: 1928-12-16  Subjective/Objective:  Pt admitted on 02/14/2018 with large Rt ICH.                  Action/Plan: Pt extubated on 02/08/18 with no plans for reintubation, per family's wishes.  Family wishes to pursue residential hospice in [employer] [employer].  CSW consulted to assist with disposition.    Expected Discharge Date:                  Expected Discharge Plan:  [place]  In-House Referral:  Clinical Social Work  Discharge planning Services  CM Consult  Post Acute Care Choice:    Choice offered to:     DME Arranged:    DME Agency:     HH Arranged:    [place] Agency:     Status of Service:  In process, will continue to follow  If discussed at Long Length of Stay Meetings, dates discussed:    Additional Comments:  Reinaldo Raddle, RN, BSN  Trauma/Neuro ICU Case Manager [phone]

## 2018-02-09 MED ORDER — MORPHINE 100MG IN NS 100ML (1MG/ML) PREMIX INFUSION
3.0000 mg/h | INTRAVENOUS | Status: DC
  Administered 2018-02-09 – 2018-02-11 (×2): 3 mg/h via INTRAVENOUS
  Filled 2018-02-09 (×2): qty 100

## 2018-02-09 MED ORDER — SODIUM CHLORIDE 0.9 % IV SOLN
INTRAVENOUS | Status: DC | PRN
  Administered 2018-02-09 – 2018-02-11 (×3): 250 mL via INTRAVENOUS

## 2018-02-09 NOTE — Social Work (Addendum)
12:56am- CSW spoke with Cassandra at [facility], no beds and a waiting list. Spoke with [facility] Nira Conn and NP Burnetta Sabin. Pt not able to be on drip for comfort would need med adjustment if family consents to return home.   8:43am- Acknowledging consult for [facility], will send request through hub for hospice home.  CSW continuing to follow for support with disposition when medically appropriate.  Westley Hummer, MSW, [place] Work [phone]

## 2018-02-09 NOTE — Progress Notes (Signed)
STROKE TEAM PROGRESS NOTE   INTERVAL HISTORY Her   Daughter is at the bedside.  Family has decided on comfort care and withdrawal of life support She seems less arousable today Vitals:   02/08/18 1659 02/09/18 0025 02/09/18 0250 02/09/18 0621  BP: 115/68   (!) 117/56  Pulse: 85   (!) 57  Resp:  (!) 30 (!) 28 18  Temp:    97.7 F (36.5 C)  TempSrc:    Oral  SpO2: 96%   92%  Weight:      Height:        CBC:  Recent Labs  Lab 02/12/2018 1124 02/07/18 0127 02/08/18 0442  WBC 8.5 10.0 8.8  NEUTROABS 7.2  --   --   HGB 11.7* 10.3* 9.4*  HCT 38.1 32.3* 29.9*  MCV 101.1* 99.7 102.0*  PLT 194 173 676    Basic Metabolic Panel:  Recent Labs  Lab 02/07/18 0127  02/07/18 2214 02/08/18 0442  NA 144   < > 158* 161*  K 4.1  --   --  3.0*  CL 110  --   --  >130*  CO2 26  --   --  22  GLUCOSE 132*  --   --  153*  BUN 11  --   --  11  CREATININE 0.83  --   --  0.75  CALCIUM 8.7*  --   --  8.7*  MG  --   --   --  1.9  PHOS  --   --   --  2.6   < > = values in this interval not displayed.   Lipid Panel:     Component Value Date/Time   CHOL 149 02/05/2018 1614   TRIG 76 02/08/2018 1614   HDL 75 02/18/2018 1614   CHOLHDL 2.0 02/15/2018 1614   VLDL 15 02/18/2018 1614   LDLCALC 59 02/15/2018 1614   HgbA1c:  Lab Results  Component Value Date   HGBA1C 5.7 (H) 02/22/2018   Urine Drug Screen:     Component Value Date/Time   LABOPIA NONE DETECTED 02/19/2018 1206   COCAINSCRNUR NONE DETECTED 02/14/2018 1206   LABBENZ NONE DETECTED 02/22/2018 1206   AMPHETMU NONE DETECTED 02/18/2018 1206   THCU NONE DETECTED 03/04/2018 1206   LABBARB NONE DETECTED 02/19/2018 1206    Alcohol Level     Component Value Date/Time   ETH <10 02/20/2018 1124    IMAGING Dg Chest Port 1 View  Result Date: 02/08/2018 CLINICAL DATA:  Respiratory failure EXAM: PORTABLE CHEST 1 VIEW COMPARISON:  03/03/2018 FINDINGS: Cardiac shadow is stable. Endotracheal tube and gastric catheter are again seen  and stable. Left ureteral stent is noted. Lungs are hyperinflated. Nipple shadows are again noted bilaterally. Slight increased density is noted in the left lung base consistent with atelectasis. No other focal abnormality is noted. IMPRESSION: COPD and mild left basilar atelectasis. No other focal abnormality is noted. Electronically Signed   By: Inez Catalina M.D.   On: 02/08/2018 08:10   2D echocardiogram  1. No evidence of left ventricular regional wall motion abnormalities. The left ventricle has normal systolic function of 19-50%. The cavity size is normal. There is normal left ventricular wall thickness. Echo evidence of impaired relaxation diastolic  filling patterns. Indeterminate left ventricular filling pressures.  2. Normal left atrial size.  3. Normal right atrial size.  4. Normal tricuspid valve.  5. Tricuspid regurgitation mild-moderate.  6. The aortic valve normal. Aortic valve regurgitation is mild  by color flow Doppler.  7. No atrial level shunt detected by color flow Doppler.   PHYSICAL EXAM  Frail elderly malnourished-looking Caucasian lady not in distress. Intubated sedated. . Afebrile. Head is nontraumatic. Neck is supple without bruit.    Cardiac exam no murmur or gallop. Lungs are clear to auscultation. Distal pulses are well felt. Neurological Exam :  She is sedated, intubated. Eyes are closed. Response to sternal rub with purposeful movements on the right. She does follow simple midline commands as well as commands with the right hand. Right gaze preference. Eyes do not move to the left even with doll's eye movements. Pupils irregular sluggishly reactive. Fundi could not be visualized.tongue is midline. Spontaneous antigravity purposeful movements on the right. Dense left hemiplegia with no response even to noxious stimuli. Right plantar downgoing left upgoing. Tone is diminished on the left compared to the right.  ASSESSMENT/PLAN Ms. BREHANNA DEVENY is a 85 y.o. female  with history of COPD, early dementia, Gerd, Hld, CAD with previous MI, pulmonary edema, a previous stroke, SAH, ASPVD, renal insufficiency, anxiety and depression who collapse at church, presenting to [facility] with HA, nausea, vomiting and L hemiparesis. Transferred to [facility].   Stroke:  Large right frontal ICH with IVH extension and SDH who developed a second smaller R lateral temporal lobe ICH. Has cytotoxic cerebral edema. Known prior ICH and likely amyloid angiopathy  NS consult Trenton Gammon) no indication for surgical intervention at this time  CT head 2/2 1139 large right frontal ICH with extension into the subdural space and IVH.  Vol 60 mLs.  MRI  2/2 72 large R ICH with SDH and IVH.  Right to left shift 14 mm.  Trapping/enlargement of left ventricle.  New hemorrhage within the right lateral temporal lobe with petechial enhancement following contrast.  No ischemia  CT head 2/3 0530 increased intraparenchymal hemorrhage and swelling on the right temporal lobe.  Unchanged large right frontal lobe hemorrhage.  No progression of subdural hemorrhage.  Midline shift with subfalcine herniation stable at 14 mm.  IVH and SAH  2D Echo  EF 55-60%. No source of embolus   LDL 76  HgbA1c 5.5  SCDs for VTE prophylaxis Diet Order            Diet NPO time specified  Diet effective now              aspirin 81 mg daily and clopidogrel 75 mg daily prior to admission, now on No antithrombotic given hemorrhage  Therapy recommendations:  pending   Disposition:  pending   Neurological worsening anticipated over the next few days  Son to speak with siblings r/t prognosis. Dr. Leonie Man available to discuss with them as a group.  Acute Respiratory Failure  Intubated at AP  CCM on board  Cytotoxic Cerebral Edema Induced Hypernatremia  Started on 3% protocol via PIC to decrease cerebral edema  Drip currently at 50/hr. Na 144  Increase drip rate to 75/hr  Goal Na 150-155  Check Na q  6h  Keep via PIV now, consider PICC if family decides for aggressive care  Dysphagia  Secondary to [place]  NPO  Febrile  TM 101.1  Likely secondary to [place]  Hypertension  Variable - 82/53-176/118 . Permissive hypertension (OK if < 220/120) but gradually normalize in 5-7 days . Long-term BP goal normotensive  Hyperlipidemia  Home meds: Lipitor 80  Hold statin given ICH  LDL 76  Consider resuming statin at discharge, depending on plan  of care  Other Stroke Risk Factors  Advanced age  Former cigarette smoker, quit 20 years ago   ETOH use  Family hx stroke (mother)  Coronary artery disease hx MI   PVD Hx of right popliteal artery aneurysm ([place])  Hx of SAH with seizure in 11/2012  EEG negative at that time  Was on keppra and depakote  depakote discontinued  Still on keppra 250mg  bid  Continue keppra   Other Active Problems  COPD  Dementia at baseline  Anemia Hgb 10.3  Hospital day # 3    She has presented with a large right brain parenchymal hemorrhage with subarachnoid and intraventricular extension and has a very poor prognosis and is unlikely to survive and making meaningful recovery and live independently. Had a long discussion with patient's son and daughters who clearly stated patient would not want to have lived with life support, tracheostomy, feeding tube and nursing home care. They agreed to DO NOT RESUSCITATE and   withdrawal of ventilatory support  . Await transfer to hospice  SNF when bed available     Antony Contras, MD Medical Director [place] Pager: [phone] 02/09/2018 5:07 PM  To contact Stroke Continuity provider, please refer to [email or web address] After hours, contact General Neurology

## 2018-02-09 NOTE — Care Management Important Message (Signed)
Important Message  Patient Details  Name: Bridget Ball MRN: [id number] Date of Birth: 26-Jan-1928   Medicare Important Message Given:  Yes    Jamesmichael Shadd 02/09/2018, 4:14 PM

## 2018-02-09 NOTE — Progress Notes (Signed)
Nutrition Brief Note  Chart reviewed. Pt now transitioning to comfort care.  No further nutrition interventions warranted at this time.  Please re-consult as needed.   Blease Capaldi A. Hamsa Laurich, RD, LDN, CDE Pager: [phone] After hours Pager: [phone]  

## 2018-02-10 NOTE — Social Work (Signed)
CSW spoke with [facility], per progression update pt not stable for transport. Will follow should this change.  Westley Hummer, MSW, [place] Work [phone]

## 2018-02-10 NOTE — Progress Notes (Signed)
STROKE TEAM PROGRESS NOTE   INTERVAL HISTORY Her   son is at the bedside.  She seems less arousable today.sshe was restless last night and needed some fentanyl Vitals:   02/09/18 0025 02/09/18 0250 02/09/18 0621 02/10/18 1003  BP:   (!) 117/56 115/68  Pulse:   (!) 57 (!) 113  Resp: (!) 30 (!) 28 18 (!) 24  Temp:   97.7 F (36.5 C) (!) 101.2 F (38.4 C)  TempSrc:   Oral   SpO2:   92% (!) 71%  Weight:      Height:        CBC:  Recent Labs  Lab 02/05/2018 1124 02/07/18 0127 02/08/18 0442  WBC 8.5 10.0 8.8  NEUTROABS 7.2  --   --   HGB 11.7* 10.3* 9.4*  HCT 38.1 32.3* 29.9*  MCV 101.1* 99.7 102.0*  PLT 194 173 242    Basic Metabolic Panel:  Recent Labs  Lab 02/07/18 0127  02/07/18 2214 02/08/18 0442  NA 144   < > 158* 161*  K 4.1  --   --  3.0*  CL 110  --   --  >130*  CO2 26  --   --  22  GLUCOSE 132*  --   --  153*  BUN 11  --   --  11  CREATININE 0.83  --   --  0.75  CALCIUM 8.7*  --   --  8.7*  MG  --   --   --  1.9  PHOS  --   --   --  2.6   < > = values in this interval not displayed.   Lipid Panel:     Component Value Date/Time   CHOL 149 02/25/2018 1614   TRIG 76 03/01/2018 1614   HDL 75 02/19/2018 1614   CHOLHDL 2.0 02/13/2018 1614   VLDL 15 02/12/2018 1614   LDLCALC 59 02/26/2018 1614   HgbA1c:  Lab Results  Component Value Date   HGBA1C 5.7 (H) 02/16/2018   Urine Drug Screen:     Component Value Date/Time   LABOPIA NONE DETECTED 02/10/2018 1206   COCAINSCRNUR NONE DETECTED 02/07/2018 1206   LABBENZ NONE DETECTED 02/18/2018 1206   AMPHETMU NONE DETECTED 02/14/2018 1206   THCU NONE DETECTED 02/05/2018 1206   LABBARB NONE DETECTED 02/12/2018 1206    Alcohol Level     Component Value Date/Time   ETH <10 02/05/2018 1124    IMAGING No results found. 2D echocardiogram  1. No evidence of left ventricular regional wall motion abnormalities. The left ventricle has normal systolic function of 68-34%. The cavity size is normal. There is  normal left ventricular wall thickness. Echo evidence of impaired relaxation diastolic  filling patterns. Indeterminate left ventricular filling pressures.  2. Normal left atrial size.  3. Normal right atrial size.  4. Normal tricuspid valve.  5. Tricuspid regurgitation mild-moderate.  6. The aortic valve normal. Aortic valve regurgitation is mild by color flow Doppler.  7. No atrial level shunt detected by color flow Doppler.   PHYSICAL EXAM  Frail elderly malnourished-looking Caucasian lady not in distress. sedated. . Afebrile. Head is nontraumatic. Neck is supple without bruit.    Cardiac exam no murmur or gallop. Lungs are clear to auscultation. Distal pulses are well felt. Neurological Exam :  She is sedated,  Eyes are closed. Response to sternal rub with purposeful movements on the right. She does follow simple midline commands as well as commands with the right  hand. Right gaze preference. Eyes do not move to the left even with doll's eye movements. Pupils irregular sluggishly reactive. Fundi could not be visualized.tongue is midline. Spontaneous antigravity purposeful movements on the right. Dense left hemiplegia with no response even to noxious stimuli. Right plantar downgoing left upgoing. Tone is diminished on the left compared to the right.  ASSESSMENT/PLAN Bridget Ball is a 85 y.o. female with history of COPD, early dementia, Gerd, Hld, CAD with previous MI, pulmonary edema, a previous stroke, SAH, ASPVD, renal insufficiency, anxiety and depression who collapse at church, presenting to [facility] with HA, nausea, vomiting and L hemiparesis. Transferred to [facility].   Stroke:  Large right frontal ICH with IVH extension and SDH who developed a second smaller R lateral temporal lobe ICH. Has cytotoxic cerebral edema. Known prior ICH and likely amyloid angiopathy  NS consult Trenton Gammon) no indication for surgical intervention at this time  CT head 2/2 1139 large right frontal ICH  with extension into the subdural space and IVH.  Vol 60 mLs.  MRI  2/2 39 large R ICH with SDH and IVH.  Right to left shift 14 mm.  Trapping/enlargement of left ventricle.  New hemorrhage within the right lateral temporal lobe with petechial enhancement following contrast.  No ischemia  CT head 2/3 0530 increased intraparenchymal hemorrhage and swelling on the right temporal lobe.  Unchanged large right frontal lobe hemorrhage.  No progression of subdural hemorrhage.  Midline shift with subfalcine herniation stable at 14 mm.  IVH and SAH  2D Echo  EF 55-60%. No source of embolus   LDL 76  HgbA1c 5.5  SCDs for VTE prophylaxis Diet Order            Diet NPO time specified  Diet effective now              aspirin 81 mg daily and clopidogrel 75 mg daily prior to admission, now on No antithrombotic given hemorrhage  Therapy recommendations:  pending   Disposition:  pending   Neurological worsening anticipated over the next few days  Son to speak with siblings r/t prognosis. Dr. Leonie Man available to discuss with them as a group.  Acute Respiratory Failure  Intubated at AP  CCM on board  Cytotoxic Cerebral Edema Induced Hypernatremia  Started on 3% protocol via PIC to decrease cerebral edema  Drip currently at 50/hr. Na 144  Increase drip rate to 75/hr  Goal Na 150-155  Check Na q 6h  Keep via PIV now, consider PICC if family decides for aggressive care  Dysphagia  Secondary to [place]  NPO  Febrile  TM 101.1  Likely secondary to [place]  Hypertension  Variable - 82/53-176/118 . Permissive hypertension (OK if < 220/120) but gradually normalize in 5-7 days . Long-term BP goal normotensive  Hyperlipidemia  Home meds: Lipitor 80  Hold statin given ICH  LDL 76  Consider resuming statin at discharge, depending on plan of care  Other Stroke Risk Factors  Advanced age  Former cigarette smoker, quit 20 years ago   ETOH use  Family hx stroke  (mother)  Coronary artery disease hx MI   PVD Hx of right popliteal artery aneurysm ([place])  Hx of SAH with seizure in 11/2012  EEG negative at that time  Was on keppra and depakote  depakote discontinued  Still on keppra 250mg  bid  Continue keppra   Other Active Problems  COPD  Dementia at baseline  Anemia Hgb 10.3  Hospital day #  4    She has presented with a large right brain parenchymal hemorrhage with subarachnoid and intraventricular extension and has a very poor prognosis and is unlikely to survive and making meaningful recovery and live independently. Had a long discussion with patient's son and daughters who clearly stated patient would not want to have lived with life support, tracheostomy, feeding tube and nursing home care. They agreed to DO NOT RESUSCITATE and   withdrawal of ventilatory support  . Await transfer to hospice  SNF when bed available     Antony Contras, MD Medical Director [place] Pager: [phone] 02/10/2018 4:29 PM  To contact Stroke Continuity provider, please refer to [email or web address] After hours, contact General Neurology

## 2018-02-10 NOTE — Plan of Care (Signed)
  Problem: Pain Managment: Goal: General experience of comfort will improve Outcome: Progressing   Problem: Role Relationship: Goal: Family's ability to cope with current situation will improve Outcome: Progressing Goal: Ability to verbalize concerns, feelings, and thoughts to partner or family member will improve Outcome: Progressing   Problem: Pain Management: Goal: Satisfaction with pain management regimen will improve Outcome: Progressing

## 2018-02-12 NOTE — Progress Notes (Signed)
IV and foley catheter removed.  Patient's body taken to the morgue via [facility].

## 2018-02-18 ENCOUNTER — Telehealth: Payer: Self-pay

## 2018-02-18 NOTE — Telephone Encounter (Signed)
FMLA done for daughter while pt was hospitalized until her death on 2018/02/15. FMLA done from 02/13/2018 to 02-15-18. Daughter refunded 50.00 dollars per Dr.Sethi. Given to Hilda Blades in medical records.

## 2018-03-06 NOTE — Progress Notes (Addendum)
STROKE TEAM PROGRESS NOTE   INTERVAL HISTORY Her son is at the bedside and dtr is on the phone via Face time with [place].  She is not responding to tactile stimuli. (did not use noxious stimulation d/t CMO). Breathing is labored, pulse is tachy. D/w SW that no inpt hospice beds available at family's choice. They will look for other facility, however she may not make it that long given her decline.  Vitals:   02/09/18 0025 02/09/18 0250 02/09/18 0621 02/10/18 1003  BP:   (!) 117/56 115/68  Pulse:   (!) 57 (!) 113  Resp: (!) 30 (!) 28 18 (!) 24  Temp:   97.7 F (36.5 C) (!) 101.2 F (38.4 C)  TempSrc:   Oral   SpO2:   92% (!) 71%  Weight:      Height:        CBC:  Recent Labs  Lab 02/09/2018 1124 02/07/18 0127 02/08/18 0442  WBC 8.5 10.0 8.8  NEUTROABS 7.2  --   --   HGB 11.7* 10.3* 9.4*  HCT 38.1 32.3* 29.9*  MCV 101.1* 99.7 102.0*  PLT 194 173 595    Basic Metabolic Panel:  Recent Labs  Lab 02/07/18 0127  02/07/18 2214 02/08/18 0442  NA 144   < > 158* 161*  K 4.1  --   --  3.0*  CL 110  --   --  >130*  CO2 26  --   --  22  GLUCOSE 132*  --   --  153*  BUN 11  --   --  11  CREATININE 0.83  --   --  0.75  CALCIUM 8.7*  --   --  8.7*  MG  --   --   --  1.9  PHOS  --   --   --  2.6   < > = values in this interval not displayed.   Lipid Panel:     Component Value Date/Time   CHOL 149 02/21/2018 1614   TRIG 76 02/25/2018 1614   HDL 75 02/20/2018 1614   CHOLHDL 2.0 02/15/2018 1614   VLDL 15 02/10/2018 1614   LDLCALC 59 02/07/2018 1614   HgbA1c:  Lab Results  Component Value Date   HGBA1C 5.7 (H) 02/12/2018   Urine Drug Screen:     Component Value Date/Time   LABOPIA NONE DETECTED 03/01/2018 1206   COCAINSCRNUR NONE DETECTED 02/07/2018 1206   LABBENZ NONE DETECTED 02/05/2018 1206   AMPHETMU NONE DETECTED 02/05/2018 1206   THCU NONE DETECTED 02/27/2018 1206   LABBARB NONE DETECTED 02/05/2018 1206    Alcohol Level     Component Value Date/Time   ETH <10  02/17/2018 1124    IMAGING No results found. 2D echocardiogram  1. No evidence of left ventricular regional wall motion abnormalities. The left ventricle has normal systolic function of 63-87%. The cavity size is normal. There is normal left ventricular wall thickness. Echo evidence of impaired relaxation diastolic  filling patterns. Indeterminate left ventricular filling pressures.  2. Normal left atrial size.  3. Normal right atrial size.  4. Normal tricuspid valve.  5. Tricuspid regurgitation mild-moderate.  6. The aortic valve normal. Aortic valve regurgitation is mild by color flow Doppler.  7. No atrial level shunt detected by color flow Doppler.   PHYSICAL EXAM  Frail elderly malnourished-looking Caucasian lady not in distress. sedated. . Afebrile. Head is nontraumatic. Neck is supple without bruit.    Cardiac exam no murmur or  gallop. Lungs are clear to auscultation. Distal pulses are felt. Neurological Exam :  She is sedated,  Eyes are closed. Dosent respond to tactile/voice, did not use sternal rub out of respect for CMO. Eyes do not move to the left even with doll's eye movements. Pupils irregular, right 15mm and left 23mm, non-reactive. No spontanious movements seen. Tone is diminished on the left compared to the right.  ASSESSMENT/PLAN [name] is a 85 y.o. female with history of COPD, early dementia, Gerd, Hld, CAD with previous MI, pulmonary edema, a previous stroke, SAH, ASPVD, renal insufficiency, anxiety and depression who collapse at church, presenting to [facility] with HA, nausea, vomiting and L hemiparesis. Transferred to [facility].   Stroke:  Large right frontal ICH with IVH extension and SDH who developed a second smaller R lateral temporal lobe ICH. Has cytotoxic cerebral edema. Known prior ICH and likely amyloid angiopathy  NS consult Trenton Gammon) no indication for surgical intervention at this time  CT head 2/2 1139 large right frontal ICH with extension  into the subdural space and IVH.  Vol 60 mLs.  MRI  2/2 12 large R ICH with SDH and IVH.  Right to left shift 14 mm.  Trapping/enlargement of left ventricle.  New hemorrhage within the right lateral temporal lobe with petechial enhancement following contrast.  No ischemia  CT head 2/3 0530 increased intraparenchymal hemorrhage and swelling on the right temporal lobe.  Unchanged large right frontal lobe hemorrhage.  No progression of subdural hemorrhage.  Midline shift with subfalcine herniation stable at 14 mm.  IVH and SAH  2D Echo  EF 55-60%. No source of embolus   LDL 76  HgbA1c 5.5  SCDs for VTE prophylaxis Diet Order            Diet NPO time specified  Diet effective now              aspirin 81 mg daily and clopidogrel 75 mg daily prior to admission, now on No antithrombotic given hemorrhage and d/t GOC  Therapy recommendations:  pending   Disposition:  pending   Neurological worsening anticipated over the next few days  Son to speak with siblings r/t prognosis. Dr. Leonie Man available to discuss with them as a group.  Acute Respiratory Failure  Intubated at AP, extubated for CMO  CCM has s/o  Cytotoxic Cerebral Edema Induced Hypernatremia  Started on 3% protocol via PIC to decrease cerebral edema  D/c at this time as withdraw of care  Dysphagia  Secondary to [place]  NPO  Febrile  TM 101.1  Likely secondary to [place]  Hypertension  Variable - 82/53-176/118 . Permissive hypertension (OK if < 220/120) but gradually normalize in 5-7 days . Long-term BP goal normotensive  Hyperlipidemia  Home meds: Lipitor 80  Hold statin given ICH  LDL 76  Consider resuming statin at discharge, depending on plan of care  Other Stroke Risk Factors  Advanced age  Former cigarette smoker, quit 20 years ago   ETOH use  Family hx stroke (mother)  Coronary artery disease hx MI   PVD Hx of right popliteal artery aneurysm ([place])  Hx of SAH with seizure in  11/2012  EEG negative at that time  Was on keppra and depakote  depakote discontinued  No longer on Keppra as no route, if there are seizures seen, we will use IV ativan.   Other Active Problems  COPD  Dementia at baseline  Anemia Hgb 10.3  Hospital day #  5    She has presented with a large right brain parenchymal hemorrhage with subarachnoid and intraventricular extension and has a very poor prognosis and is unlikely to survive and making meaningful recovery and live independently. Had a long discussion with patient's son and daughters who clearly stated patient would not want to have lived with life support, tracheostomy, feeding tube and nursing home care. They agreed to DO NOT RESUSCITATE and   withdrawal of ventilatory support  . Await transfer to hospice, however she has rapidly declined overnight and doubt she will survive until a hospice bed is open. (Per SW there is a 8 person wait list for the facility that family choose)     Desiree Metzger-Cihelka, ARNP-C, ANVP-BC [facility] Pager: [phone] 2018-03-03 11:38 AM I have personally obtained history,examined this patient, reviewed notes, independently viewed imaging studies, participated in medical decision making and plan of care.ROS completed by me personally and pertinent positives fully documented  I have made any additions or clarifications directly to the above note. Agree with note above. Discussion with son at bedside and answered questions. Greater than 50% time during this 25 minute visit was spent on counseling and coordination of care about her comfort care situation   Antony Contras, [place] Pager: [phone] March 03, 2018 4:07 PM  To contact Stroke Continuity provider, please refer to [email or web address] After hours, contact General Neurology

## 2018-03-06 NOTE — Social Work (Addendum)
11:26am- CSW spoke with pt son Clair Gulling at bedside, he states that he is concerned about pt comfort and stability for transport. We discussed other hospice options but he again states a desire for pt to remain here. When pt other children arrive to hospital today he will talk to them for general consensus. Pt unable to go home with hospice on a drip, of which pt family aware.  11:06am- Alerted pt family interested in another hospice home given [facility] waiting list.   Westley Hummer, MSW, [place] Work [phone]

## 2018-03-06 NOTE — Plan of Care (Signed)
  Problem: Pain Managment: Goal: General experience of comfort will improve Outcome: Progressing   Problem: Role Relationship: Goal: Family's ability to cope with current situation will improve Outcome: Progressing Goal: Ability to verbalize concerns, feelings, and thoughts to partner or family member will improve Outcome: Progressing   Problem: Pain Management: Goal: Satisfaction with pain management regimen will improve Outcome: Progressing

## 2018-03-06 NOTE — Consult Note (Signed)
   [facility] CM Inpatient Consult   2018-02-20  Bridget Ball Dec 02, 1928 [id number]   Patient screened for potential [facility] Care Management services. Chart reviewed. Noted Mrs. Harcum is under comfort care measures. There are no identifiable [facility] Care Management needs.   Marthenia Rolling, MSN-Ed, RN,BSN [facility] Liaison [phone]

## 2018-03-06 NOTE — Discharge Summary (Signed)
Patient ID: Bridget Ball MRN: [id number] DOB/AGE: [birth year] 85 y.o.  Admit date: 02-17-2018 Death date: February 22, 2018  Admission Diagnoses: Intracerebral hemorrhage  Cause of Death: Respiratory failure due to massive intracerebral hemorrhage with cytotoxic edema and brain herniation.  Patient made DNR and comfort care by family and life support withdrawn  Pertinent Medical Diagnosis: Active Problems:   Respiratory failure ([place])   ICH (intracerebral hemorrhage) [facility])   Hospital Course:  85 y.o. female with a history of COPD, early dementia, Gerd, Hld, CAD with previous MI, pulmonary edema, a previous stroke, SAH, ASPVD, renal insufficiency, anxiety and depression who today developed a headache, N/V, and left sided weakness. A CT scan revealed a large RIGHT frontal intracerebral hemorrhage, 51 x 68 x 34 mm, with extension into the subdural space both interhemispheric as well as along the convexity. Also intraventricular extension. Estimated volume 60 mL. Midline shift approximately 9 mm. She is a full code. The pt was seen in consultation by Dr Annette Stable who did not felt that surgery was indicated at this time. She will be admitted to the NICU for supportive care and hypertonic saline therapy should be.  However patient's neurological exam remained poor and after multiple discussions with the patient's family they understood her poor prognosis and they did not want her to be on prolonged life support and go to a nursing home and have tracheostomy and PEG tube which would have been unavoidable.  Hence after her 5 children  Discussed amongst themselves and with with her power of attorney they made the patient DNR and comfort care.  She was extubated and kept comfortable on morphine drip and transferred to palliative care unit.  The patient's condition gradually declined and she was kept comfortable on morphine and she passed away on 2018/02/22  Signed: Antony Contras 02/16/2018, 3:37 PM

## 2018-03-06 NOTE — Progress Notes (Signed)
Pt expired at 2219. Son Clair Gulling is at bedside.Emotional support given to family member. Death pronounced by L. Claudia Desanctis and M. Rasing, Rn. CDS and Dr Katherine Roan was notified. 77ml of Morphine 1mg /ml wasted with M. Rasing.

## 2018-03-06 DEATH — deceased

## 2018-03-22 ENCOUNTER — Ambulatory Visit (INDEPENDENT_AMBULATORY_CARE_PROVIDER_SITE_OTHER): Payer: Medicare Other | Admitting: Internal Medicine

## 2018-04-09 IMAGING — US US RENAL
1 series · 14 of 25 positions shown · non-contrast
Comparison: 06/02/2016

CLINICAL DATA: Urinary retention for 3 days

EXAM:
RENAL / URINARY TRACT ULTRASOUND COMPLETE

[Series 1: us renal · 0.19mm/px · 14 of 25 slices shown]
[im 1/25]
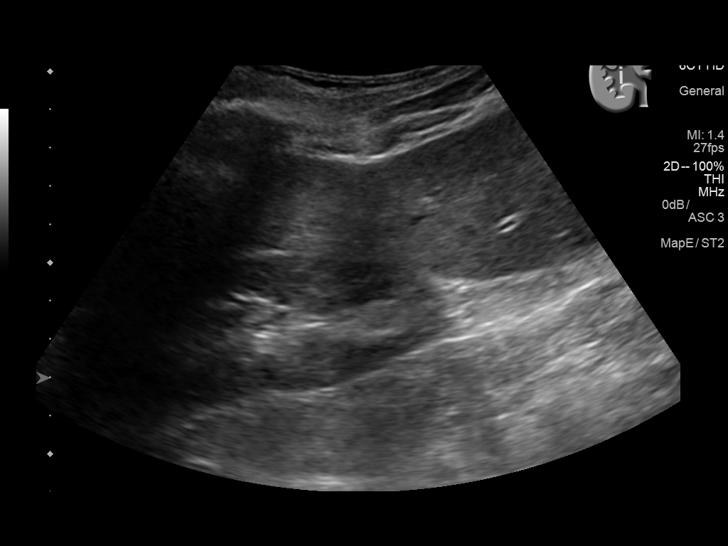
[im 3/25]
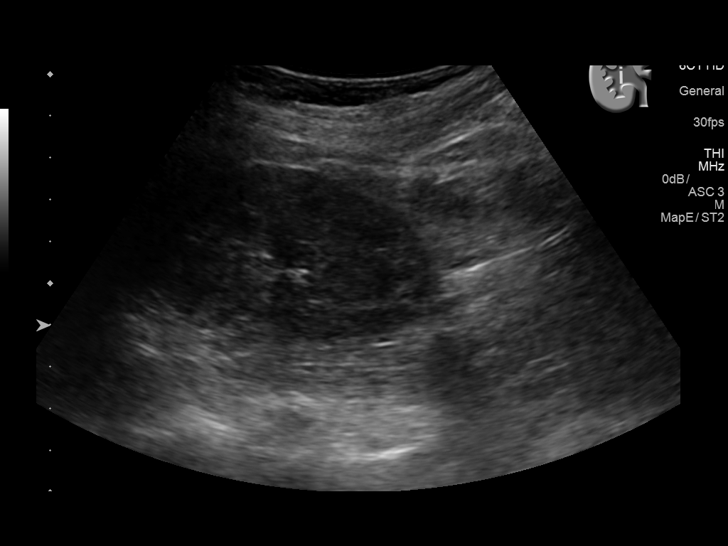
[im 5/25]
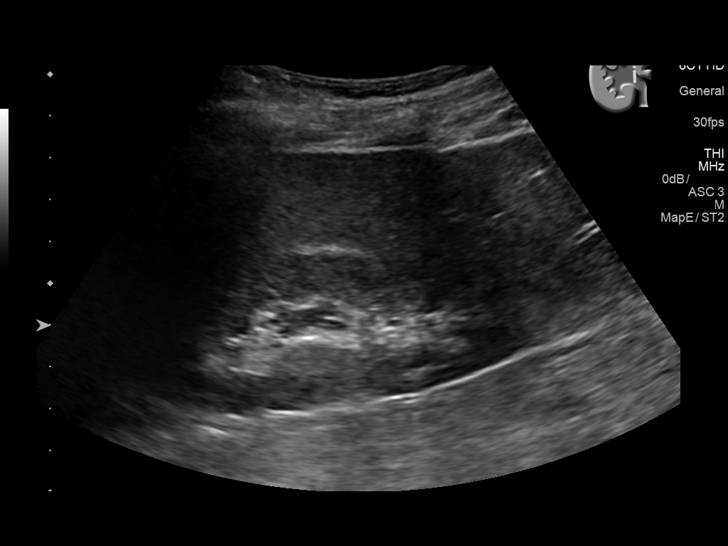
[im 7/25]
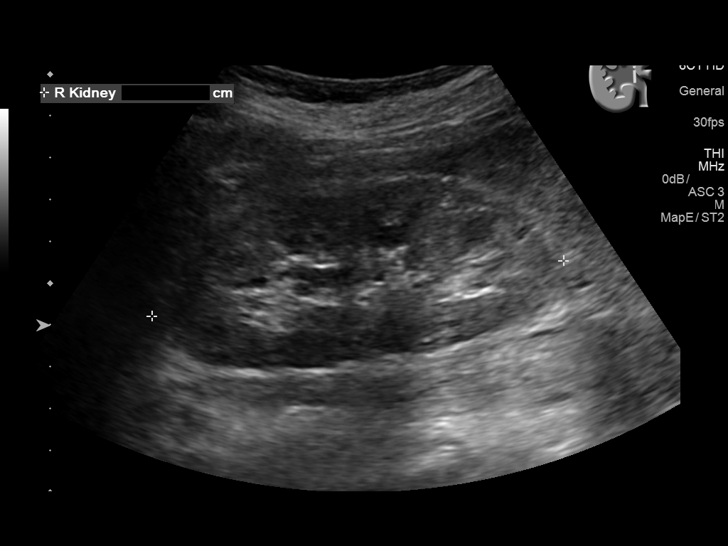
[im 9/25]
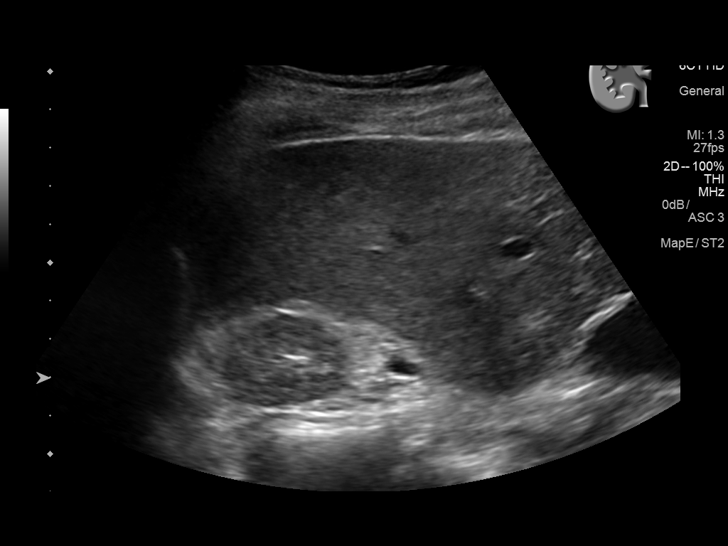
[im 10/25]
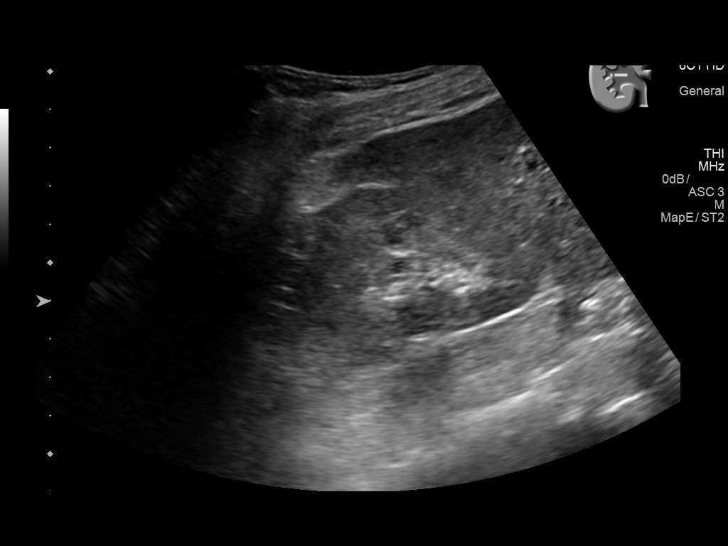
[im 12/25]
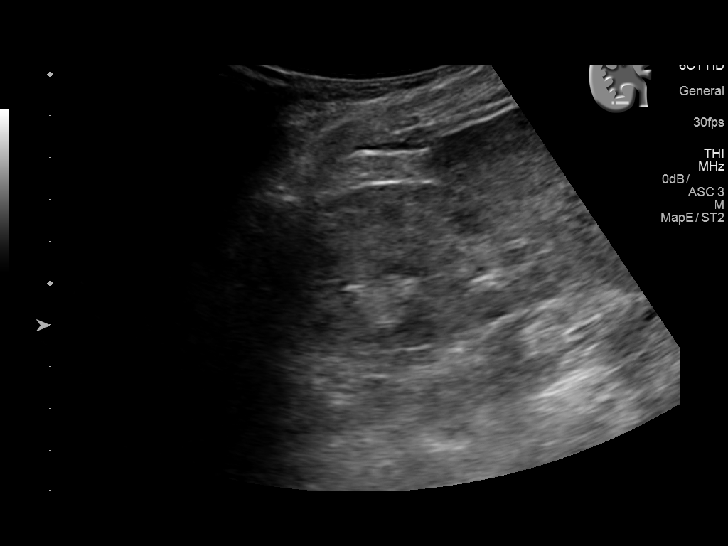
[im 14/25]
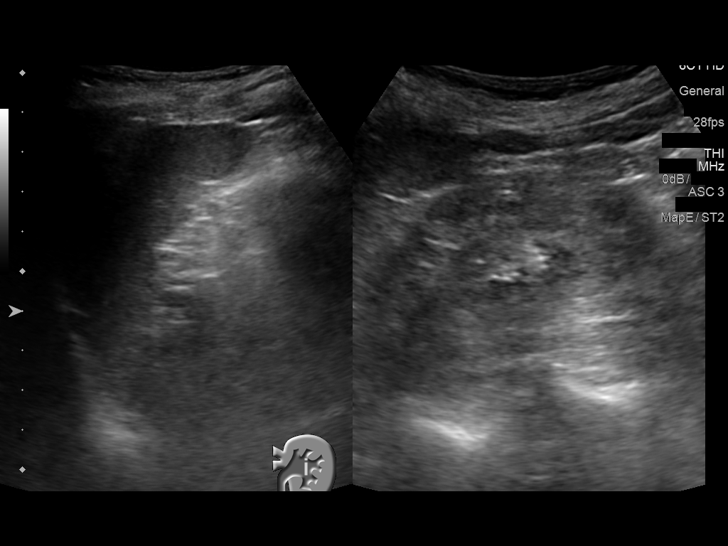
[im 16/25]
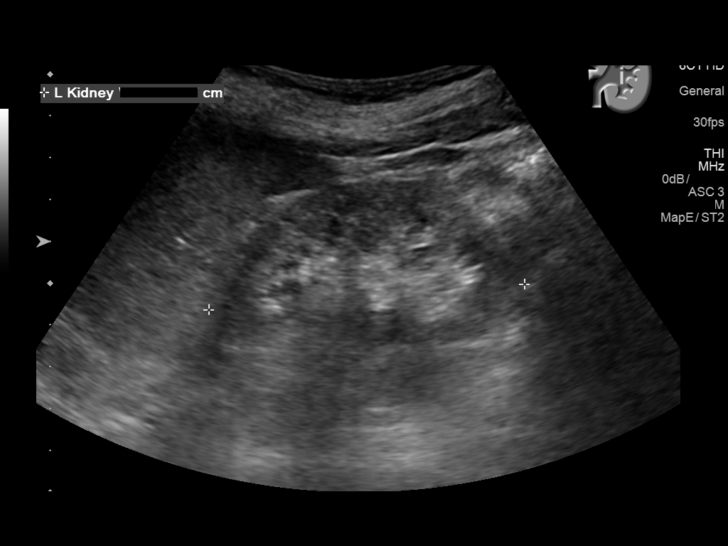
[im 17/25]
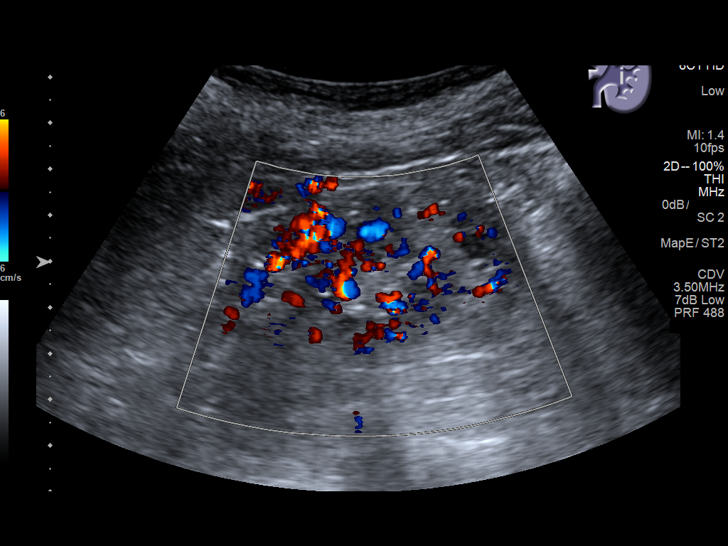
[im 19/25]
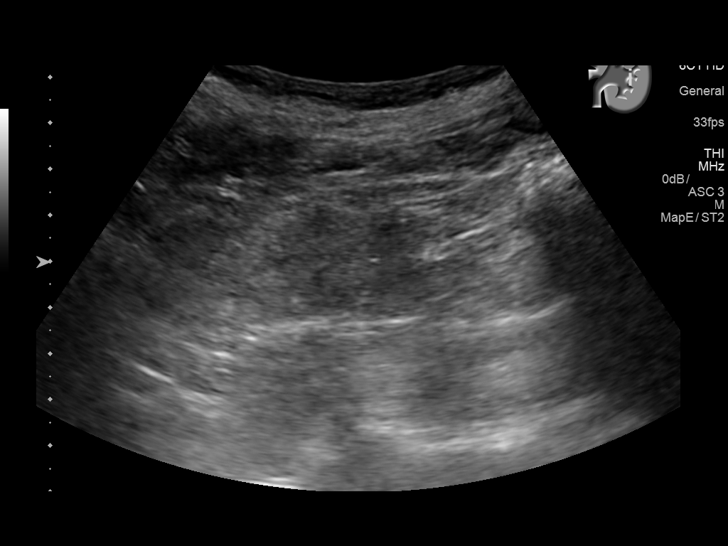
[im 21/25]
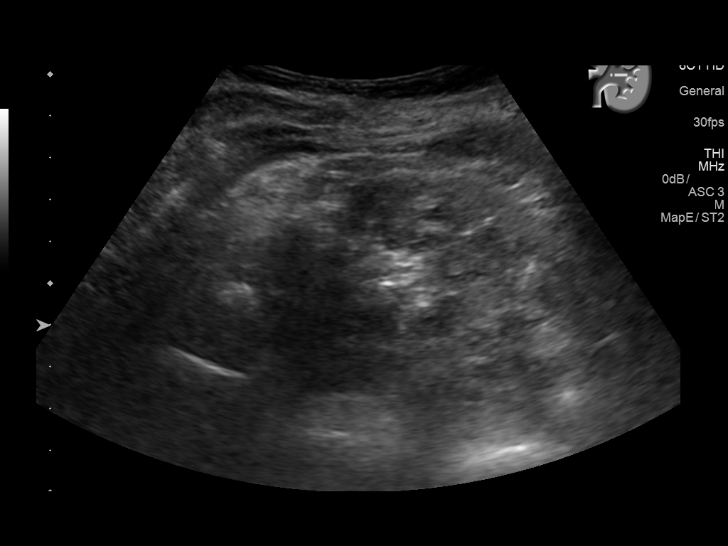
[im 23/25]
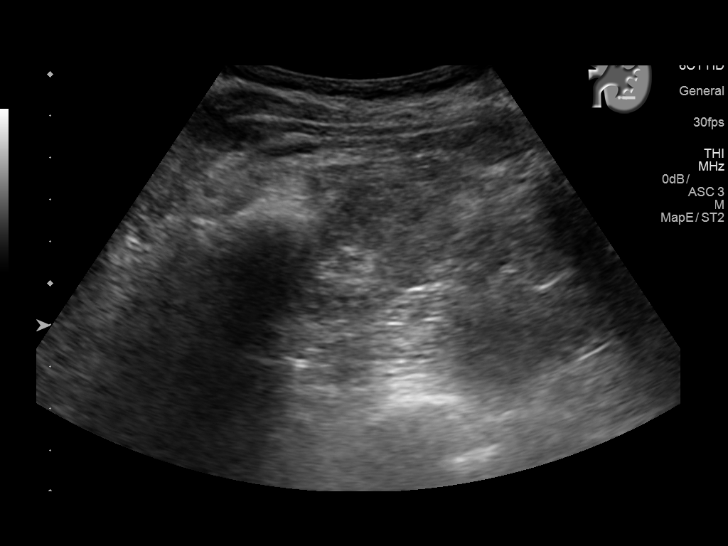
[im 25/25]
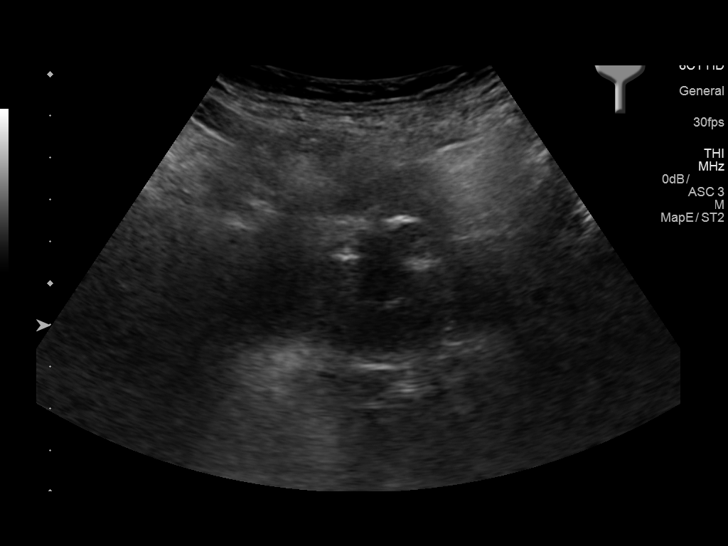

[14 of 25 positions shown; findings below may reference images not displayed]

FINDINGS: Right Kidney:

Length: 9.9 cm. Normal cortical thickness and echogenicity. No mass,
hydronephrosis or shadowing calcification.

Left Kidney:

Length: 7.6 cm. Cortical atrophy. No gross mass or hydronephrosis.
No shadowing calcification.

Bladder:

Decompressed by Foley catheter. Ureteral stent seen on the previous
exam is not identified on the current study.
IMPRESSION: Atrophic LEFT kidney.

Otherwise negative exam.

## 2018-05-25 ENCOUNTER — Ambulatory Visit: Payer: Medicare Other | Admitting: Adult Health

## 2018-10-02 IMAGING — CR DG KNEE COMPLETE 4+V*R*
4 series · 4 of 4 positions shown · non-contrast
Comparison: None.

CLINICAL DATA: Fall today with generalized right knee pain. Insert
image

EXAM:
RIGHT KNEE - COMPLETE 4+ VIEW

[x knee ap right (1 of 3)]
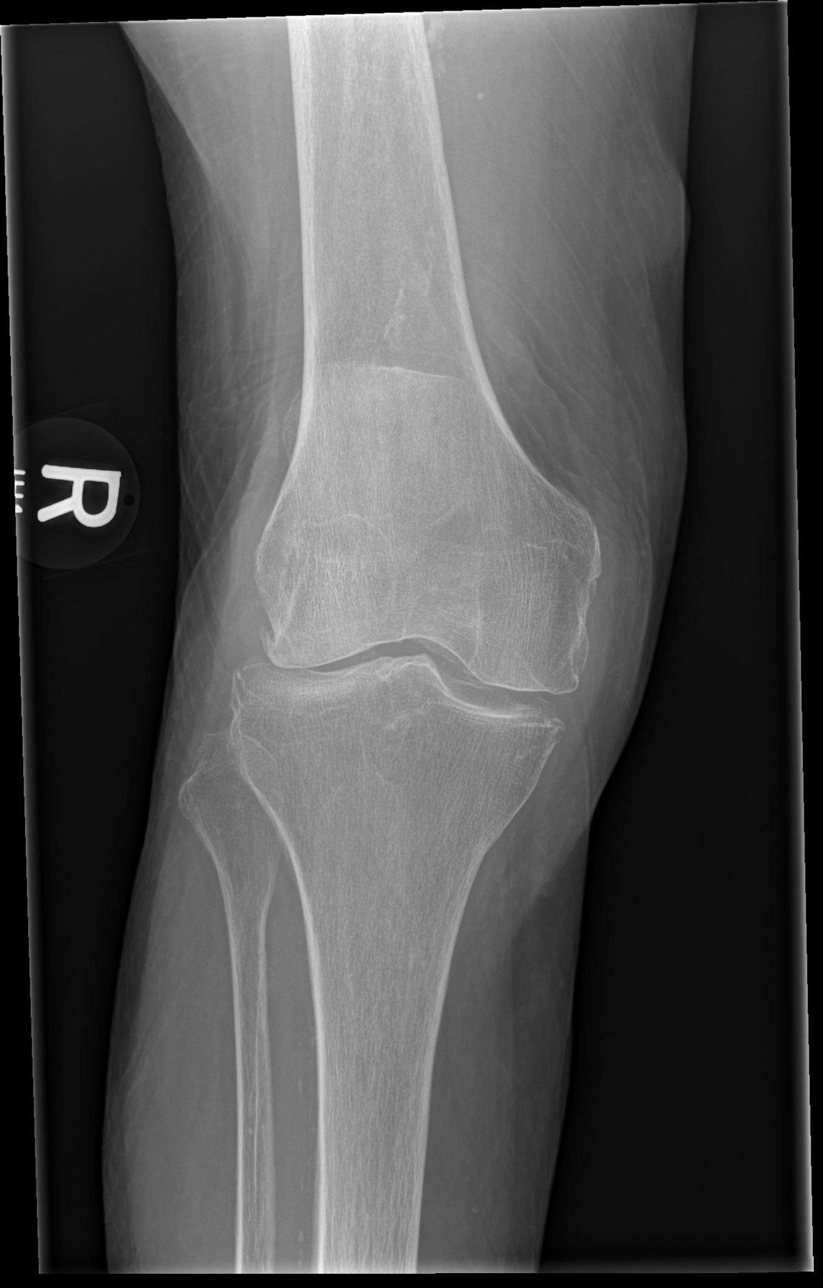

[x knee ap right (2 of 3)]
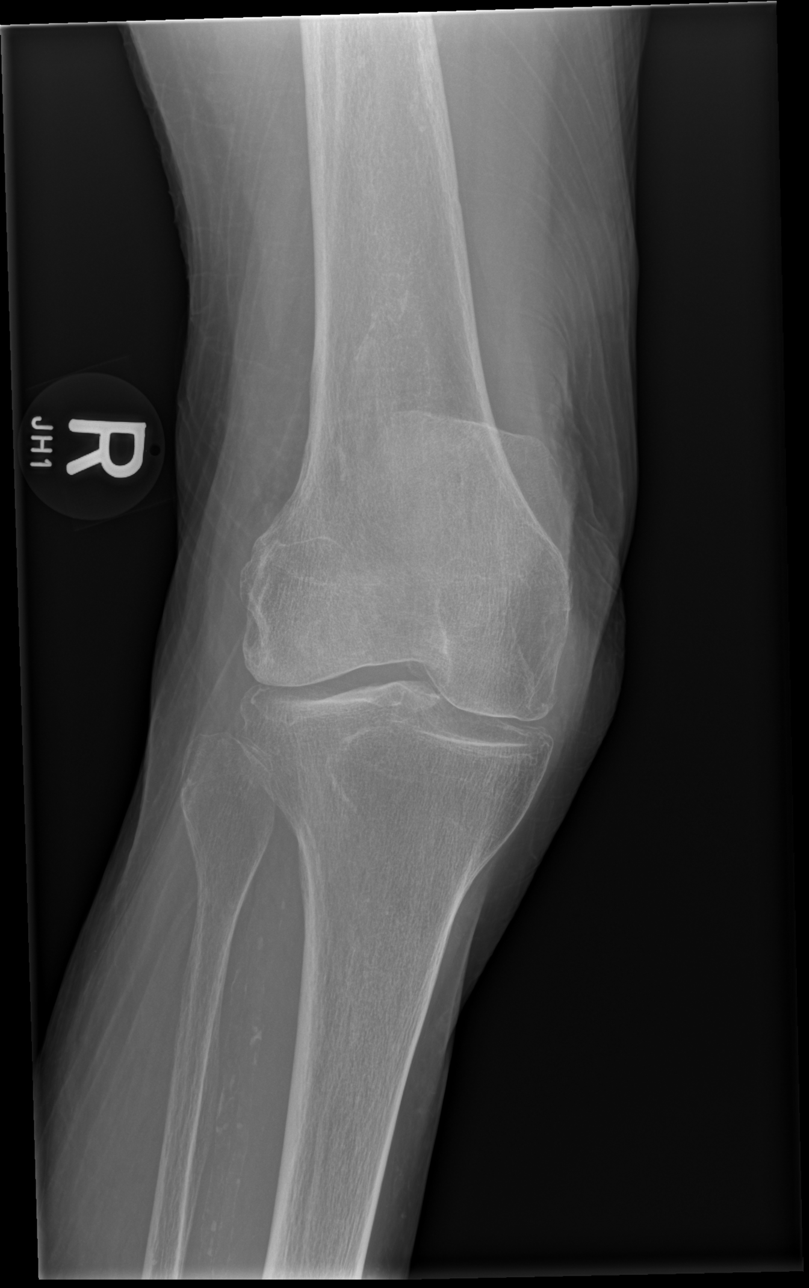

[x knee ap right (3 of 3)]
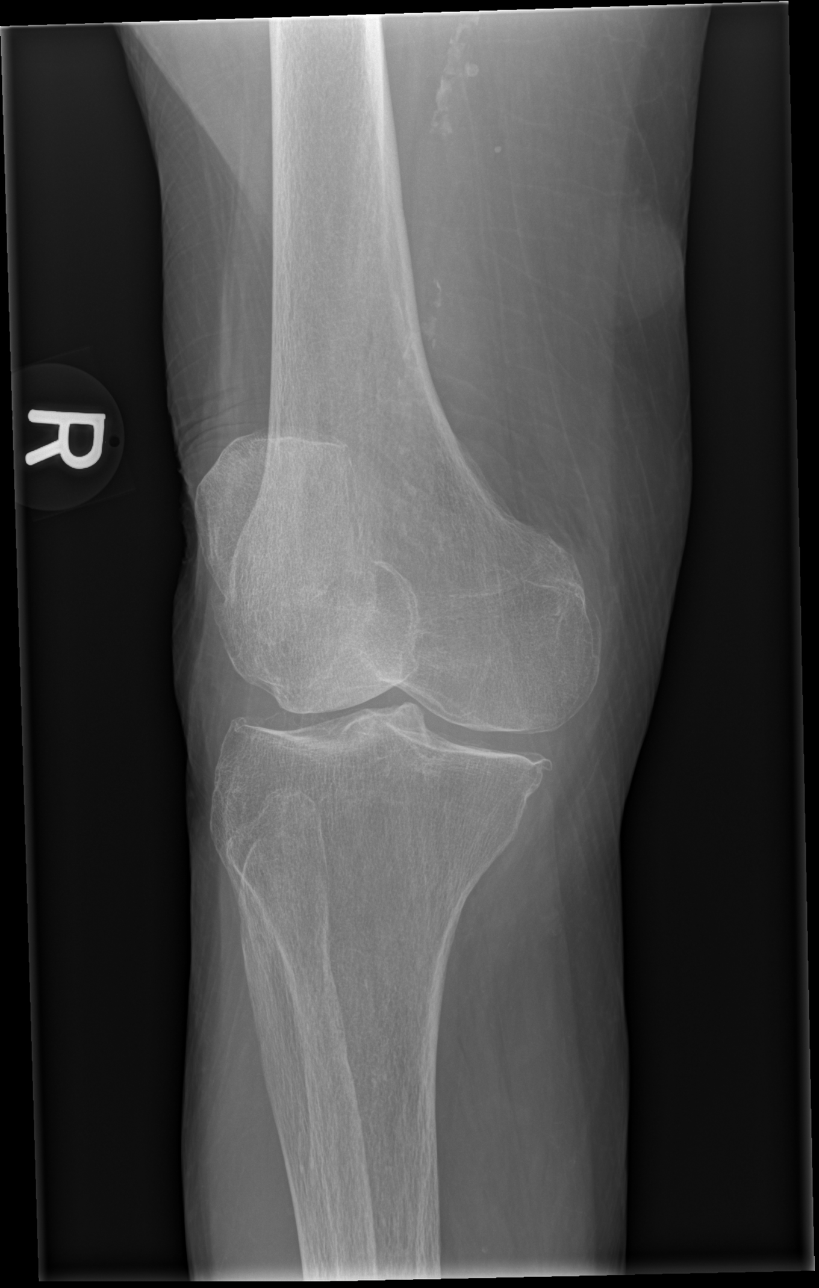

[x knee lat right]
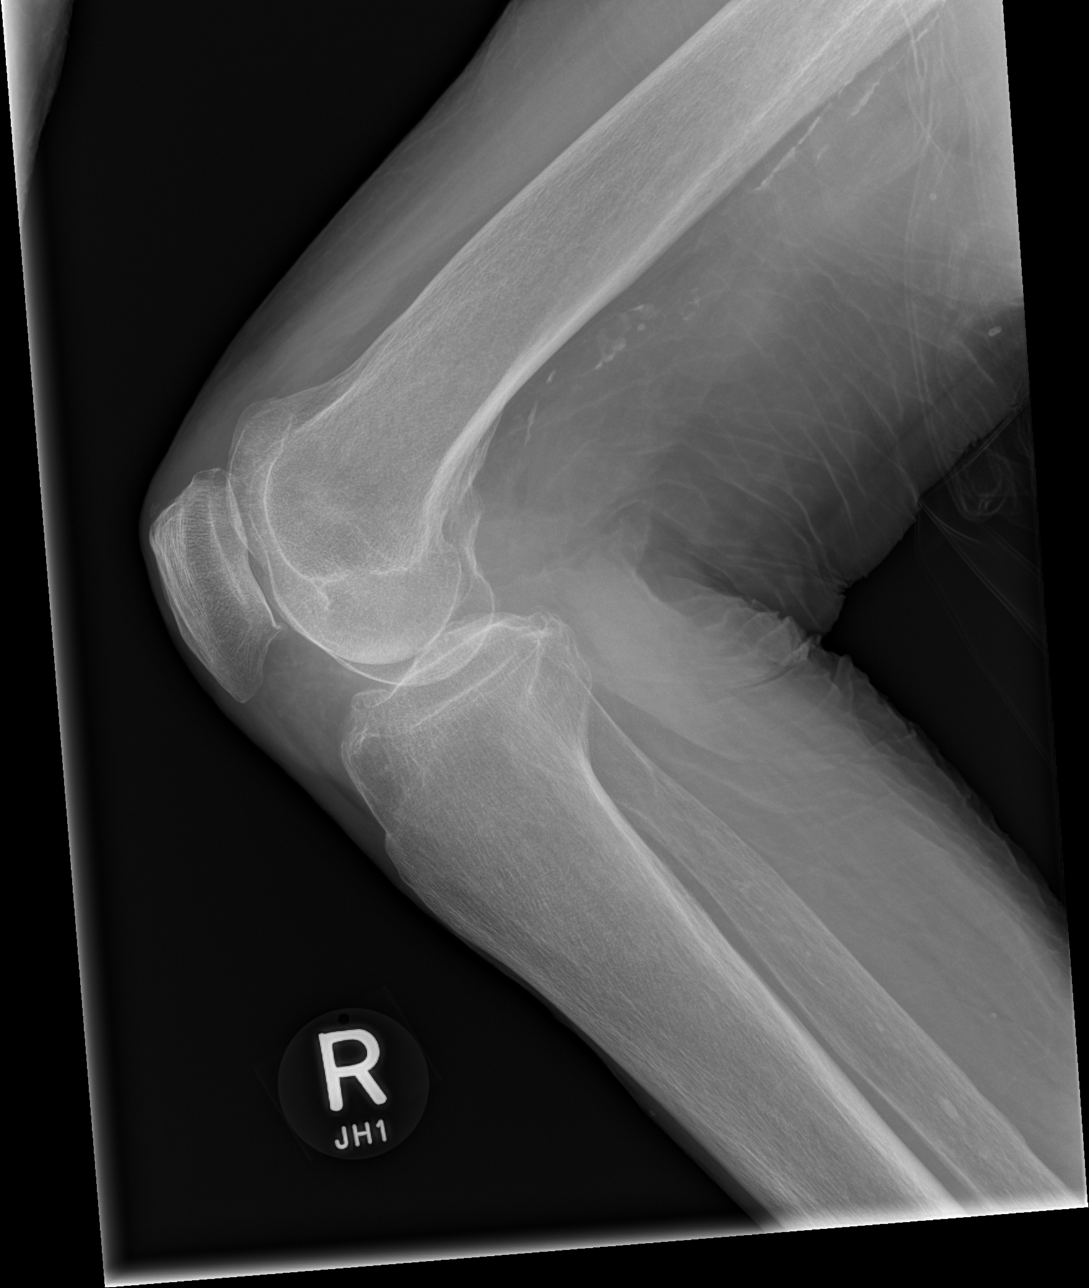

[4 of 4 positions shown; findings below may reference images not displayed]

FINDINGS: No evidence of fracture, dislocation, or joint effusion. Osteopenia
and mild degenerative spurring. Atherosclerotic calcification.
IMPRESSION: No acute finding.
